# Patient Record
Sex: Male | Born: 1955 | Race: White | Hispanic: No | Marital: Married | Smoking: Former smoker
Health system: Southern US, Community
[De-identification: ages and names within clinical notes are randomized; demographics above are authoritative.]

## PROBLEM LIST (undated history)

## (undated) DIAGNOSIS — J42 Unspecified chronic bronchitis: Secondary | ICD-10-CM

## (undated) DIAGNOSIS — M170 Bilateral primary osteoarthritis of knee: Secondary | ICD-10-CM

## (undated) DIAGNOSIS — M25569 Pain in unspecified knee: Secondary | ICD-10-CM

## (undated) DIAGNOSIS — M869 Osteomyelitis, unspecified: Secondary | ICD-10-CM

## (undated) DIAGNOSIS — F4321 Adjustment disorder with depressed mood: Secondary | ICD-10-CM

## (undated) DIAGNOSIS — F419 Anxiety disorder, unspecified: Secondary | ICD-10-CM

## (undated) DIAGNOSIS — I4891 Unspecified atrial fibrillation: Secondary | ICD-10-CM

## (undated) DIAGNOSIS — I2699 Other pulmonary embolism without acute cor pulmonale: Secondary | ICD-10-CM

## (undated) DIAGNOSIS — K279 Peptic ulcer, site unspecified, unspecified as acute or chronic, without hemorrhage or perforation: Secondary | ICD-10-CM

## (undated) DIAGNOSIS — G8929 Other chronic pain: Secondary | ICD-10-CM

## (undated) DIAGNOSIS — I739 Peripheral vascular disease, unspecified: Secondary | ICD-10-CM

## (undated) DIAGNOSIS — F329 Major depressive disorder, single episode, unspecified: Secondary | ICD-10-CM

## (undated) DIAGNOSIS — K219 Gastro-esophageal reflux disease without esophagitis: Secondary | ICD-10-CM

## (undated) DIAGNOSIS — G43909 Migraine, unspecified, not intractable, without status migrainosus: Secondary | ICD-10-CM

## (undated) DIAGNOSIS — M25512 Pain in left shoulder: Secondary | ICD-10-CM

## (undated) DIAGNOSIS — E785 Hyperlipidemia, unspecified: Secondary | ICD-10-CM

## (undated) DIAGNOSIS — F32A Depression, unspecified: Secondary | ICD-10-CM

## (undated) DIAGNOSIS — I10 Essential (primary) hypertension: Secondary | ICD-10-CM

## (undated) HISTORY — DX: Osteomyelitis, unspecified: M86.9

## (undated) HISTORY — PX: TONSILLECTOMY: SUR1361

## (undated) HISTORY — PX: HEMORRHOIDECTOMY WITH HEMORRHOID BANDING: SHX5633

## (undated) HISTORY — PX: APPENDECTOMY: SHX54

## (undated) HISTORY — PX: JOINT REPLACEMENT: SHX530

## (undated) HISTORY — PX: CHOLECYSTECTOMY: SHX55

## (undated) HISTORY — PX: SHOULDER ARTHROSCOPY W/ ROTATOR CUFF REPAIR: SHX2400

## (undated) HISTORY — PX: INGUINAL HERNIA REPAIR: SUR1180

## (undated) HISTORY — PX: KNEE ARTHROSCOPY: SHX127

## (undated) HISTORY — PX: REPLACEMENT TOTAL KNEE BILATERAL: SUR1225

---

## 1988-07-27 HISTORY — PX: ELBOW ARTHROSCOPY WITH TENDON RECONSTRUCTION: SHX5616

## 1989-07-27 HISTORY — PX: UVULOPALATOPLASTY: SHX2633

## 1991-07-28 HISTORY — PX: CARDIAC CATHETERIZATION: SHX172

## 1998-03-18 ENCOUNTER — Inpatient Hospital Stay (HOSPITAL_COMMUNITY): Admission: EM | Admit: 1998-03-18 | Discharge: 1998-03-19 | Payer: Self-pay | Admitting: Emergency Medicine

## 1998-03-18 ENCOUNTER — Ambulatory Visit (HOSPITAL_COMMUNITY): Admission: RE | Admit: 1998-03-18 | Discharge: 1998-03-18 | Payer: Self-pay | Admitting: *Deleted

## 1998-04-07 ENCOUNTER — Emergency Department (HOSPITAL_COMMUNITY): Admission: EM | Admit: 1998-04-07 | Discharge: 1998-04-07 | Payer: Self-pay | Admitting: Emergency Medicine

## 1998-04-28 ENCOUNTER — Emergency Department (HOSPITAL_COMMUNITY): Admission: EM | Admit: 1998-04-28 | Discharge: 1998-04-28 | Payer: Self-pay

## 1998-05-12 ENCOUNTER — Emergency Department (HOSPITAL_COMMUNITY): Admission: EM | Admit: 1998-05-12 | Discharge: 1998-05-12 | Payer: Self-pay | Admitting: Emergency Medicine

## 1998-06-16 ENCOUNTER — Emergency Department (HOSPITAL_COMMUNITY): Admission: EM | Admit: 1998-06-16 | Discharge: 1998-06-16 | Payer: Self-pay | Admitting: Emergency Medicine

## 1998-07-26 ENCOUNTER — Emergency Department (HOSPITAL_COMMUNITY): Admission: EM | Admit: 1998-07-26 | Discharge: 1998-07-26 | Payer: Self-pay | Admitting: Emergency Medicine

## 1998-07-27 ENCOUNTER — Emergency Department (HOSPITAL_COMMUNITY): Admission: EM | Admit: 1998-07-27 | Discharge: 1998-07-27 | Payer: Self-pay | Admitting: Emergency Medicine

## 1998-08-11 ENCOUNTER — Emergency Department (HOSPITAL_COMMUNITY): Admission: EM | Admit: 1998-08-11 | Discharge: 1998-08-11 | Payer: Self-pay | Admitting: Endocrinology

## 1998-09-14 ENCOUNTER — Emergency Department (HOSPITAL_COMMUNITY): Admission: EM | Admit: 1998-09-14 | Discharge: 1998-09-14 | Payer: Self-pay | Admitting: Internal Medicine

## 1998-11-15 ENCOUNTER — Emergency Department (HOSPITAL_COMMUNITY): Admission: EM | Admit: 1998-11-15 | Discharge: 1998-11-15 | Payer: Self-pay | Admitting: Emergency Medicine

## 1998-11-19 ENCOUNTER — Observation Stay (HOSPITAL_COMMUNITY): Admission: AD | Admit: 1998-11-19 | Discharge: 1998-11-20 | Payer: Self-pay | Admitting: *Deleted

## 1998-11-24 ENCOUNTER — Inpatient Hospital Stay (HOSPITAL_COMMUNITY): Admission: EM | Admit: 1998-11-24 | Discharge: 1998-11-28 | Payer: Self-pay | Admitting: Emergency Medicine

## 1998-11-28 ENCOUNTER — Inpatient Hospital Stay (HOSPITAL_COMMUNITY): Admission: EM | Admit: 1998-11-28 | Discharge: 1998-12-02 | Payer: Self-pay | Admitting: Addiction Psychiatry

## 1998-12-03 ENCOUNTER — Encounter (HOSPITAL_COMMUNITY): Admission: RE | Admit: 1998-12-03 | Discharge: 1999-01-03 | Payer: Self-pay | Admitting: Psychiatry

## 1999-01-06 ENCOUNTER — Encounter: Admission: RE | Admit: 1999-01-06 | Discharge: 1999-02-11 | Payer: Self-pay | Admitting: *Deleted

## 1999-11-08 ENCOUNTER — Other Ambulatory Visit: Admission: RE | Admit: 1999-11-08 | Discharge: 1999-11-08 | Payer: Self-pay | Admitting: General Surgery

## 1999-11-08 ENCOUNTER — Encounter (INDEPENDENT_AMBULATORY_CARE_PROVIDER_SITE_OTHER): Payer: Self-pay | Admitting: Specialist

## 2000-09-16 ENCOUNTER — Observation Stay (HOSPITAL_COMMUNITY): Admission: EM | Admit: 2000-09-16 | Discharge: 2000-09-17 | Payer: Self-pay | Admitting: Emergency Medicine

## 2000-09-16 ENCOUNTER — Encounter: Payer: Self-pay | Admitting: Internal Medicine

## 2000-09-17 ENCOUNTER — Encounter: Payer: Self-pay | Admitting: Internal Medicine

## 2000-12-20 ENCOUNTER — Encounter: Payer: Self-pay | Admitting: Orthopedic Surgery

## 2000-12-20 ENCOUNTER — Ambulatory Visit (HOSPITAL_COMMUNITY): Admission: RE | Admit: 2000-12-20 | Discharge: 2000-12-20 | Payer: Self-pay | Admitting: Orthopedic Surgery

## 2000-12-29 ENCOUNTER — Ambulatory Visit (HOSPITAL_BASED_OUTPATIENT_CLINIC_OR_DEPARTMENT_OTHER): Admission: RE | Admit: 2000-12-29 | Discharge: 2000-12-29 | Payer: Self-pay | Admitting: Orthopedic Surgery

## 2001-06-01 ENCOUNTER — Ambulatory Visit (HOSPITAL_BASED_OUTPATIENT_CLINIC_OR_DEPARTMENT_OTHER): Admission: RE | Admit: 2001-06-01 | Discharge: 2001-06-01 | Payer: Self-pay | Admitting: Orthopedic Surgery

## 2001-08-18 ENCOUNTER — Ambulatory Visit (HOSPITAL_COMMUNITY): Admission: RE | Admit: 2001-08-18 | Discharge: 2001-08-18 | Payer: Self-pay | Admitting: Gastroenterology

## 2001-10-18 ENCOUNTER — Ambulatory Visit (HOSPITAL_COMMUNITY): Admission: RE | Admit: 2001-10-18 | Discharge: 2001-10-18 | Payer: Self-pay | Admitting: Family Medicine

## 2001-10-18 ENCOUNTER — Encounter: Payer: Self-pay | Admitting: Family Medicine

## 2002-05-07 ENCOUNTER — Emergency Department (HOSPITAL_COMMUNITY): Admission: EM | Admit: 2002-05-07 | Discharge: 2002-05-07 | Payer: Self-pay | Admitting: Emergency Medicine

## 2002-05-14 ENCOUNTER — Encounter: Payer: Self-pay | Admitting: *Deleted

## 2002-05-14 ENCOUNTER — Emergency Department (HOSPITAL_COMMUNITY): Admission: EM | Admit: 2002-05-14 | Discharge: 2002-05-14 | Payer: Self-pay | Admitting: Emergency Medicine

## 2002-05-19 ENCOUNTER — Ambulatory Visit (HOSPITAL_COMMUNITY): Admission: RE | Admit: 2002-05-19 | Discharge: 2002-05-19 | Payer: Self-pay | Admitting: *Deleted

## 2002-05-19 ENCOUNTER — Encounter: Payer: Self-pay | Admitting: *Deleted

## 2002-06-03 ENCOUNTER — Ambulatory Visit (HOSPITAL_BASED_OUTPATIENT_CLINIC_OR_DEPARTMENT_OTHER): Admission: RE | Admit: 2002-06-03 | Discharge: 2002-06-03 | Payer: Self-pay | Admitting: Pulmonary Disease

## 2002-10-15 ENCOUNTER — Emergency Department (HOSPITAL_COMMUNITY): Admission: EM | Admit: 2002-10-15 | Discharge: 2002-10-15 | Payer: Self-pay | Admitting: Emergency Medicine

## 2002-12-02 ENCOUNTER — Encounter: Payer: Self-pay | Admitting: Emergency Medicine

## 2002-12-02 ENCOUNTER — Inpatient Hospital Stay (HOSPITAL_COMMUNITY): Admission: EM | Admit: 2002-12-02 | Discharge: 2002-12-04 | Payer: Self-pay | Admitting: Emergency Medicine

## 2003-02-18 ENCOUNTER — Emergency Department (HOSPITAL_COMMUNITY): Admission: EM | Admit: 2003-02-18 | Discharge: 2003-02-18 | Payer: Self-pay | Admitting: Emergency Medicine

## 2003-03-01 ENCOUNTER — Encounter: Payer: Self-pay | Admitting: Orthopedic Surgery

## 2003-03-01 ENCOUNTER — Ambulatory Visit (HOSPITAL_COMMUNITY): Admission: RE | Admit: 2003-03-01 | Discharge: 2003-03-01 | Payer: Self-pay | Admitting: Orthopedic Surgery

## 2003-03-10 ENCOUNTER — Emergency Department (HOSPITAL_COMMUNITY): Admission: EM | Admit: 2003-03-10 | Discharge: 2003-03-10 | Payer: Self-pay | Admitting: Emergency Medicine

## 2003-04-22 ENCOUNTER — Emergency Department (HOSPITAL_COMMUNITY): Admission: EM | Admit: 2003-04-22 | Discharge: 2003-04-22 | Payer: Self-pay | Admitting: *Deleted

## 2003-05-14 ENCOUNTER — Emergency Department (HOSPITAL_COMMUNITY): Admission: EM | Admit: 2003-05-14 | Discharge: 2003-05-14 | Payer: Self-pay | Admitting: Emergency Medicine

## 2003-05-25 ENCOUNTER — Emergency Department (HOSPITAL_COMMUNITY): Admission: EM | Admit: 2003-05-25 | Discharge: 2003-05-25 | Payer: Self-pay | Admitting: Emergency Medicine

## 2003-05-28 ENCOUNTER — Emergency Department (HOSPITAL_COMMUNITY): Admission: EM | Admit: 2003-05-28 | Discharge: 2003-05-28 | Payer: Self-pay | Admitting: Emergency Medicine

## 2003-08-05 ENCOUNTER — Emergency Department (HOSPITAL_COMMUNITY): Admission: EM | Admit: 2003-08-05 | Discharge: 2003-08-05 | Payer: Self-pay | Admitting: Emergency Medicine

## 2003-08-19 ENCOUNTER — Emergency Department (HOSPITAL_COMMUNITY): Admission: EM | Admit: 2003-08-19 | Discharge: 2003-08-19 | Payer: Self-pay | Admitting: Emergency Medicine

## 2003-09-10 ENCOUNTER — Ambulatory Visit (HOSPITAL_COMMUNITY): Admission: RE | Admit: 2003-09-10 | Discharge: 2003-09-10 | Payer: Self-pay | Admitting: Family Medicine

## 2003-09-27 ENCOUNTER — Emergency Department (HOSPITAL_COMMUNITY): Admission: EM | Admit: 2003-09-27 | Discharge: 2003-09-27 | Payer: Self-pay | Admitting: Emergency Medicine

## 2003-10-08 ENCOUNTER — Emergency Department (HOSPITAL_COMMUNITY): Admission: EM | Admit: 2003-10-08 | Discharge: 2003-10-08 | Payer: Self-pay | Admitting: *Deleted

## 2003-11-04 ENCOUNTER — Emergency Department (HOSPITAL_COMMUNITY): Admission: EM | Admit: 2003-11-04 | Discharge: 2003-11-04 | Payer: Self-pay | Admitting: Emergency Medicine

## 2003-11-24 ENCOUNTER — Emergency Department (HOSPITAL_COMMUNITY): Admission: EM | Admit: 2003-11-24 | Discharge: 2003-11-24 | Payer: Self-pay | Admitting: *Deleted

## 2003-12-02 ENCOUNTER — Emergency Department (HOSPITAL_COMMUNITY): Admission: EM | Admit: 2003-12-02 | Discharge: 2003-12-02 | Payer: Self-pay | Admitting: Emergency Medicine

## 2003-12-22 ENCOUNTER — Emergency Department (HOSPITAL_COMMUNITY): Admission: EM | Admit: 2003-12-22 | Discharge: 2003-12-22 | Payer: Self-pay | Admitting: Emergency Medicine

## 2003-12-30 ENCOUNTER — Emergency Department (HOSPITAL_COMMUNITY): Admission: EM | Admit: 2003-12-30 | Discharge: 2003-12-30 | Payer: Self-pay | Admitting: Emergency Medicine

## 2004-01-19 ENCOUNTER — Emergency Department (HOSPITAL_COMMUNITY): Admission: EM | Admit: 2004-01-19 | Discharge: 2004-01-19 | Payer: Self-pay | Admitting: Emergency Medicine

## 2004-02-10 ENCOUNTER — Emergency Department (HOSPITAL_COMMUNITY): Admission: EM | Admit: 2004-02-10 | Discharge: 2004-02-10 | Payer: Self-pay | Admitting: Emergency Medicine

## 2004-02-20 ENCOUNTER — Emergency Department (HOSPITAL_COMMUNITY): Admission: EM | Admit: 2004-02-20 | Discharge: 2004-02-20 | Payer: Self-pay | Admitting: Emergency Medicine

## 2004-04-05 ENCOUNTER — Emergency Department (HOSPITAL_COMMUNITY): Admission: EM | Admit: 2004-04-05 | Discharge: 2004-04-05 | Payer: Self-pay | Admitting: Emergency Medicine

## 2004-08-10 ENCOUNTER — Emergency Department (HOSPITAL_COMMUNITY): Admission: EM | Admit: 2004-08-10 | Discharge: 2004-08-10 | Payer: Self-pay | Admitting: Emergency Medicine

## 2004-08-16 ENCOUNTER — Emergency Department (HOSPITAL_COMMUNITY): Admission: EM | Admit: 2004-08-16 | Discharge: 2004-08-16 | Payer: Self-pay | Admitting: Emergency Medicine

## 2004-08-30 ENCOUNTER — Emergency Department (HOSPITAL_COMMUNITY): Admission: EM | Admit: 2004-08-30 | Discharge: 2004-08-30 | Payer: Self-pay | Admitting: Emergency Medicine

## 2004-09-19 ENCOUNTER — Emergency Department (HOSPITAL_COMMUNITY): Admission: EM | Admit: 2004-09-19 | Discharge: 2004-09-19 | Payer: Self-pay | Admitting: Emergency Medicine

## 2004-09-30 ENCOUNTER — Emergency Department (HOSPITAL_COMMUNITY): Admission: EM | Admit: 2004-09-30 | Discharge: 2004-09-30 | Payer: Self-pay | Admitting: Emergency Medicine

## 2004-10-07 ENCOUNTER — Emergency Department (HOSPITAL_COMMUNITY): Admission: EM | Admit: 2004-10-07 | Discharge: 2004-10-07 | Payer: Self-pay | Admitting: Emergency Medicine

## 2004-10-23 ENCOUNTER — Emergency Department (HOSPITAL_COMMUNITY): Admission: EM | Admit: 2004-10-23 | Discharge: 2004-10-23 | Payer: Self-pay | Admitting: Emergency Medicine

## 2004-11-13 ENCOUNTER — Emergency Department (HOSPITAL_COMMUNITY): Admission: EM | Admit: 2004-11-13 | Discharge: 2004-11-13 | Payer: Self-pay | Admitting: Family Medicine

## 2004-11-17 ENCOUNTER — Emergency Department (HOSPITAL_COMMUNITY): Admission: EM | Admit: 2004-11-17 | Discharge: 2004-11-17 | Payer: Self-pay | Admitting: Emergency Medicine

## 2004-11-23 ENCOUNTER — Emergency Department (HOSPITAL_COMMUNITY): Admission: EM | Admit: 2004-11-23 | Discharge: 2004-11-23 | Payer: Self-pay | Admitting: Emergency Medicine

## 2004-12-01 ENCOUNTER — Emergency Department (HOSPITAL_COMMUNITY): Admission: EM | Admit: 2004-12-01 | Discharge: 2004-12-01 | Payer: Self-pay | Admitting: Family Medicine

## 2004-12-07 ENCOUNTER — Emergency Department (HOSPITAL_COMMUNITY): Admission: EM | Admit: 2004-12-07 | Discharge: 2004-12-07 | Payer: Self-pay | Admitting: Emergency Medicine

## 2004-12-16 ENCOUNTER — Emergency Department (HOSPITAL_COMMUNITY): Admission: EM | Admit: 2004-12-16 | Discharge: 2004-12-17 | Payer: Self-pay | Admitting: *Deleted

## 2004-12-29 ENCOUNTER — Emergency Department (HOSPITAL_COMMUNITY): Admission: EM | Admit: 2004-12-29 | Discharge: 2004-12-29 | Payer: Self-pay | Admitting: Emergency Medicine

## 2005-01-07 ENCOUNTER — Emergency Department (HOSPITAL_COMMUNITY): Admission: EM | Admit: 2005-01-07 | Discharge: 2005-01-07 | Payer: Self-pay | Admitting: Emergency Medicine

## 2005-01-23 ENCOUNTER — Emergency Department (HOSPITAL_COMMUNITY): Admission: EM | Admit: 2005-01-23 | Discharge: 2005-01-23 | Payer: Self-pay | Admitting: Emergency Medicine

## 2005-02-11 ENCOUNTER — Emergency Department (HOSPITAL_COMMUNITY): Admission: EM | Admit: 2005-02-11 | Discharge: 2005-02-11 | Payer: Self-pay | Admitting: Emergency Medicine

## 2005-02-17 ENCOUNTER — Emergency Department (HOSPITAL_COMMUNITY): Admission: EM | Admit: 2005-02-17 | Discharge: 2005-02-18 | Payer: Self-pay | Admitting: Emergency Medicine

## 2005-02-20 ENCOUNTER — Emergency Department (HOSPITAL_COMMUNITY): Admission: EM | Admit: 2005-02-20 | Discharge: 2005-02-20 | Payer: Self-pay | Admitting: Emergency Medicine

## 2005-02-27 ENCOUNTER — Emergency Department (HOSPITAL_COMMUNITY): Admission: EM | Admit: 2005-02-27 | Discharge: 2005-02-27 | Payer: Self-pay | Admitting: Emergency Medicine

## 2005-03-10 ENCOUNTER — Emergency Department (HOSPITAL_COMMUNITY): Admission: EM | Admit: 2005-03-10 | Discharge: 2005-03-10 | Payer: Self-pay | Admitting: Emergency Medicine

## 2005-03-11 ENCOUNTER — Emergency Department (HOSPITAL_COMMUNITY): Admission: EM | Admit: 2005-03-11 | Discharge: 2005-03-11 | Payer: Self-pay | Admitting: Emergency Medicine

## 2005-03-17 ENCOUNTER — Emergency Department (HOSPITAL_COMMUNITY): Admission: EM | Admit: 2005-03-17 | Discharge: 2005-03-17 | Payer: Self-pay | Admitting: Emergency Medicine

## 2005-03-25 ENCOUNTER — Emergency Department (HOSPITAL_COMMUNITY): Admission: EM | Admit: 2005-03-25 | Discharge: 2005-03-25 | Payer: Self-pay | Admitting: Emergency Medicine

## 2005-04-02 ENCOUNTER — Emergency Department (HOSPITAL_COMMUNITY): Admission: EM | Admit: 2005-04-02 | Discharge: 2005-04-02 | Payer: Self-pay | Admitting: Emergency Medicine

## 2005-04-09 ENCOUNTER — Emergency Department (HOSPITAL_COMMUNITY): Admission: EM | Admit: 2005-04-09 | Discharge: 2005-04-09 | Payer: Self-pay | Admitting: *Deleted

## 2005-04-24 ENCOUNTER — Emergency Department (HOSPITAL_COMMUNITY): Admission: EM | Admit: 2005-04-24 | Discharge: 2005-04-24 | Payer: Self-pay | Admitting: Emergency Medicine

## 2005-05-10 ENCOUNTER — Emergency Department (HOSPITAL_COMMUNITY): Admission: EM | Admit: 2005-05-10 | Discharge: 2005-05-10 | Payer: Self-pay | Admitting: Emergency Medicine

## 2005-07-07 ENCOUNTER — Emergency Department (HOSPITAL_COMMUNITY): Admission: EM | Admit: 2005-07-07 | Discharge: 2005-07-07 | Payer: Self-pay | Admitting: Emergency Medicine

## 2005-07-27 ENCOUNTER — Emergency Department (HOSPITAL_COMMUNITY): Admission: EM | Admit: 2005-07-27 | Discharge: 2005-07-27 | Payer: Self-pay | Admitting: Emergency Medicine

## 2005-08-09 ENCOUNTER — Emergency Department (HOSPITAL_COMMUNITY): Admission: EM | Admit: 2005-08-09 | Discharge: 2005-08-09 | Payer: Self-pay | Admitting: Emergency Medicine

## 2005-08-11 ENCOUNTER — Emergency Department (HOSPITAL_COMMUNITY): Admission: EM | Admit: 2005-08-11 | Discharge: 2005-08-11 | Payer: Self-pay | Admitting: Emergency Medicine

## 2005-11-01 ENCOUNTER — Emergency Department (HOSPITAL_COMMUNITY): Admission: EM | Admit: 2005-11-01 | Discharge: 2005-11-01 | Payer: Self-pay | Admitting: Emergency Medicine

## 2006-04-03 ENCOUNTER — Emergency Department (HOSPITAL_COMMUNITY): Admission: EM | Admit: 2006-04-03 | Discharge: 2006-04-04 | Payer: Self-pay | Admitting: Emergency Medicine

## 2006-07-19 ENCOUNTER — Emergency Department (HOSPITAL_COMMUNITY): Admission: EM | Admit: 2006-07-19 | Discharge: 2006-07-19 | Payer: Self-pay | Admitting: Emergency Medicine

## 2006-11-16 ENCOUNTER — Emergency Department (HOSPITAL_COMMUNITY): Admission: EM | Admit: 2006-11-16 | Discharge: 2006-11-16 | Payer: Self-pay | Admitting: Emergency Medicine

## 2007-02-20 ENCOUNTER — Emergency Department (HOSPITAL_COMMUNITY): Admission: EM | Admit: 2007-02-20 | Discharge: 2007-02-20 | Payer: Self-pay | Admitting: Family Medicine

## 2010-12-12 NOTE — Op Note (Signed)
[place]. [facility]  Patient:    [name], [name]                          MRN: [id number] Proc. Date: 12/29/00 Adm. Date:  [id number] Disc. 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  OPERATION:  Right elbow arthroscopy with debridement of capitellar cyst and drilling.  SURGEON:  Georgena Spurling, M.D.  ASSISTANT:  None.  ANESTHESIA:  General.  INDICATION FOR PROCEDURE:  The patient is a 54 year old white male with mechanical symptoms unrelenting with physical therapy and after informed consent was obtained he was taken to the operating room.  DESCRIPTION OF PROCEDURE:  The patient was laid in the supine position and administered general endotracheal anesthesia and then placed in the left down right up lateral decubitus position.  The right upper extremity was prepped in the usual sterile fashion with the arm draped over an arm board.  We created a proximal medial portal 2 cm proximal to the medial epicondyle and over the intramuscular septum.  We palpated the intramuscular septum and made sure that we stayed anterior to that.  We went down into the radial capitellar joint bluntly.  We then inserted the camera and proved the joint.  It was difficult to see anything wrong with the radial capitellar joint.  There was some synovitis so we did make a lateral portal with the inside out technique and did debride the synovitis. We then entered the radial capitellar joint through the soft spot and triangle portal with the vac and a proximal straight posterior portal.  We continued to debride some synovitis and then found a large loose flap of cartilage in the far medial side of the capitellum which was over a cyst approximately a square centimeter in size.  I debrided  this flap of cartilage, debrided the cyst and drilled the cyst and tried to initiate some fracture healing response there. Once I had debrided this area thoroughly, I checked the rest of the joint.  I lavaged the joint and evacuated the joint of fluid and instruments.  I closed each portal with interrupted 4-0 Nylon.  Dressed with Adaptic 4 x 4s, bulky dressing and a simple sling.  The patient tolerated the procedure well. Complications were none.  Drains were none. DD:  02/10/01 TD:  02/10/01 Job: [id number]

## 2010-12-12 NOTE — Op Note (Signed)
[place]. [facility]  Patient:    [name], [name] Visit Number: [id number] MRN: [id number]          Service Type: DSU Location: [facility] Attending Physician:  Georgena Spurling Dictated by:   Georgena Spurling, M.D. Proc. Date: 06/01/01 Admit Date:  06/01/2001 Discharge Date: 06/01/2001                             Operative Report  PREOPERATIVE DIAGNOSIS:  Right elbow lateral epicondylitis.  POSTOPERATIVE DIAGNOSIS:  Right elbow lateral epicondylitis.  OPERATION PERFORMED:  Right lateral epicondylar release.  SURGEON:  Georgena Spurling, M.D.  ANESTHESIA:  General LMA.  INDICATIONS FOR PROCEDURE:  The patient is several months status post conservative measures for treatment of lateral epicondylitis and informed consent was obtained.  DESCRIPTION OF PROCEDURE:  The patient was laid supine and administered general LMA anesthesia.  Right upper extremity was prepped and draped in the usual sterile fashion.  A straight 3 cm incision was made over the lateral epicondyle through the skin and small flaps were raised.  I then incised the common extensor tendon and ellipsed out the major portion and released it from the epicondyle.  I then subperiosteally removed all debris from the bone and placed five to six drill holes into the bone.  At this point we irrigated the wound and then closed the sheath over the common extensor tendon with interrupted figure-of-eight 0 Vicryl sutures and then 2-0 Vicryl sutures in the subcuticular layer and then Steri-Strips.  We dressed with Adaptic, 4 x 4s, sterile Webril, Ace wrap and a sling.  The patient tolerated the procedure well.  COMPLICATIONS:  None.  DRAINS:  None. Dictated by:   Georgena Spurling, M.D. Attending Physician:  Georgena Spurling DD:  06/01/01 TD:  06/03/01 Job: [id number]

## 2010-12-12 NOTE — Cardiovascular Report (Signed)
   [name], [name]                             ACCOUNT NO.:  [id number]   MEDICAL RECORD NO.:  [id number]                   PATIENT TYPE:  INP   LOCATION:  4712                                 FACILITY:  [facility]   PHYSICIAN:  Peter M. Swaziland, M.D.               DATE OF BIRTH:  11-12-1955   DATE OF PROCEDURE:  12/04/2002  DATE OF DISCHARGE:                              CARDIAC CATHETERIZATION   INDICATIONS FOR PROCEDURE:  The patient is a 55 year old white male who  presents with refractory chest pain during treatment for severe migraines  with high-dose Imitrex.  The patient has a history of hyperlipidemia.   ACCESS:  Via the right femoral artery using the standard Seldinger  technique.   EQUIPMENT:  Six-French 4-cm right and left Judkins catheters, a 6-French  pigtail catheter, a 6-French arterial sheath.   MEDICATIONS:  Local anesthesia with 1% Xylocaine, Versed 2 mg IV.   CONTRAST:  130 mL of Omnipaque.   HEMODYNAMIC DATA:  AORTIC PRESSURE:  138/90 with a mean of 112.  LEFT VENTRICULAR PRESSURE:  132 with an EDP of 22 mmHg.   ANGIOGRAPHIC DATA:  LEFT CORONARY ARTERY:  Arises and distributes normally.  The left main coronary artery is normal.   LEFT ANTERIOR DESCENDING ARTERY:  The left anterior descending and its  branches are normal.   LEFT CIRCUMFLEX CORONARY ARTERY:  Normal.   RIGHT CORONARY ARTERY:  The right coronary artery is a large, dominant  vessel, and is normal.   LEFT VENTRICULAR ANGIOGRAPHY:  Performed in the RAO view.  This demonstrates  normal left ventricular size and contractility with normal systolic  function.  Ejection fraction is estimated at 60%.  There is no mitral  regurgitation or prolapse.  The aortic root appears normal in size.  The  aortic valve appears normal.    FINAL INTERPRETATION:  1. Normal coronary anatomy.  2. Normal left ventricular function.                                               Peter M. Swaziland, M.D.    PMJ/MEDQ   D:  12/04/2002  T:  12/05/2002  Job:  [id number]   cc:   Holley Bouche, M.D.  [address]  [place], [place] [address]  Fax: [phone]   Lazaro Arms, M.D.  [address] [place], [place] [address]  Fax: [phone]

## 2010-12-12 NOTE — Discharge Summary (Signed)
[name], [name]                             ACCOUNT NO.:  [id number]   MEDICAL RECORD NO.:  [id number]                   PATIENT TYPE:  INP   LOCATION:  4712                                 FACILITY:  [facility]   PHYSICIAN:  Phyllip Claw DICTATOR                    DATE OF BIRTH:  29-Jun-1956   DATE OF ADMISSION:  12/02/2002  DATE OF DISCHARGE:  12/04/2002                                 DISCHARGE SUMMARY   DISCHARGE DIAGNOSES:  1. Atypical chest pain, possible gastroesophageal reflux disease or     intermittent vasospasm.  2. Migraine headaches.  3. Hypercholesterolemia.  4. Anxiety.  5. Depression.  6. Gastroesophageal reflux disease.   PROCEDURE:  Cardiac catheterization on Dec 04, 2002 which was within normal  limits.   HISTORY OF PRESENT ILLNESS:  The patient presented to the emergency room  with substernal chest pain accompanied by shortness of breath and nausea  about 30 minutes after taking high dose Imitrex for refractory migraines.  Pain lasted about an hour and was made better with nitroglycerin.  He was  sent to the emergency room for further evaluation.  In the emergency room he  was pain-free.  He was admitted, placed on telemetry, and subsequently ruled  out for myocardial infarction with serial negative troponins.  Peter M.  Swazil[place], M.D. from cardiology was consulted on Dec 03, 2002.  The patient did  have some recurrence of the chest discomfort and so at that point Peter M.  [place]and, M.D. recommended cardiac catheterization in order to assess whether  or not he had any underlying coronary artery disease.  The patient had the  occasional chest pain, but actually his major complaint during the  hospitalization was his migraines which were made much worse by nitrate  therapy.  The patient underwent cardiac catheterization without  complications on Dec 04, 2002.  He did very well and it was a completely  normal catheterization with a normal ejection fraction.  The  patient  remained on the floor per protocol for the catheterization and was  discharged home in stable condition on Dec 04, 2002 with his wife.   DISCHARGE PLAN:  1. The patient is to go home today.  He has an appointment tomorrow with Dr.     Tiburcio Pea already in order to discuss further therapy for his migraines.  2. Dr. Tiburcio Pea will also follow up on his reflux as we started empiric PPI     therapy while he was in the hospital.   DISCHARGE MEDICATIONS:  1. Klonopin 2 mg p.o. t.i.d.  2. Pravachol 40 mg p.o. q.h.s.  3. Celexa 60 mg p.o. daily.  4. Aspirin 325 mg p.o. daily.  5. Keflex 3000 mg p.o. daily.  6.     Protonix 40 mg p.o. daily.  7. Norvasc 5 mg p.o. daily as an empiric trial of a possible vasodilator  in     order to help his chest pain.  This was started by Peter M. Swaziland, M.D.                                               Latesa Fratto DICTATOR    DD/MEDQ  D:  12/04/2002  T:  12/06/2002  Job:  [id number]   cc:   Peter M. Sw[place]il[place], M.D.  [address]  [place], [place] [address]  Fax: [phone]   Tiburcio Pea, M.D.

## 2010-12-12 NOTE — Consult Note (Signed)
[name], STEELY                             ACCOUNT NO.:  [id number]   MEDICAL RECORD NO.:  [id number]                   PATIENT TYPE:  INP   LOCATION:  4712                                 FACILITY:  [facility]   PHYSICIAN:  [name], M.D.               DATE OF BIRTH:  1956-07-10   DATE OF CONSULTATION:  12/03/2002  DATE OF DISCHARGE:  12/04/2002                                   CONSULTATION   HISTORY OF PRESENT ILLNESS:  The patient is a 55 year old white male with  history of severe chronic refractory migraine headaches.  He has been  treated with high dose Imitrex and vasoconstrictors.  He was admitted last  night after a prolonged episode of chest pain associated with taking high  dose Imitrex.  The pain was substernal in location with severe heaviness  lasting approximately one hour and was relieved with sublingual  nitroglycerin.  He was admitted last night and subsequently ruled out for  myocardial infarction.  However, this morning after breakfast his pain  recurred and again was 90% relieved with sublingual nitroglycerin. His pain  was completely relieved after morphine.  There were no acute ST changes  noted on ECG during his pain.  The patient does have cardiac risk factors  including age, sex, dyslipidemia and history of mild hypertension.  He had  undergone prior routine exercise stress test in 2/02 and this was a negative  study.   PAST MEDICAL HISTORY:  Severe refractory chronic migraines, history of  hypertension, history of dyslipidemia, anxiety and depression.   CURRENT MEDICATIONS:  1. Klonopin 2 mg p.o. t.i.d.  2. Pravachol 40 mg daily.  3. Celexa 60 mg daily.  4. Niacin 500 mg p.o. q.h.s.  5. Keppra  300 mg daily.  6. Coated aspirin 325 mg daily.   ALLERGIES:  1. VERAPAMIL.  2. DHEA.  3. EFFEXOR.  4. ENTAL.   SOCIAL HISTORY:  The patient is married.  Denies tobacco or alcohol use.  He  is a Environmental consultant.   FAMILY HISTORY:   Mother is 83 and has a history of ventricular arrhythmias.  Father is age 83 and alive and well.   REVIEW OF SYSTEMS:  Otherwise unremarkable except for severe headache.   PHYSICAL EXAMINATION:  GENERAL:  The patient is an awake white male in no  apparent distress.  VITAL SIGNS:  Blood pressure is 130/84, pulse 86 and regular.  He is  afebrile.  HEENT:  Unremarkable.  He has no jugular venous distention or bruits.  LUNGS:  Clear.  CARDIAC:  Regular  rate and rhythm without murmurs, rubs or gallops or  clicks.  ABDOMEN:  Soft, nontender.  No masses or hepatosplenomegaly.  EXTREMITIES:  Femoral and pedal pulses are 2+ and symmetric.   LABORATORY DATA:  ECG on 5/8 and 12/03/02 with chest pain showed normal sinus  rhythm with first-degree AV  block and no acute ST-T wave changes.  White  count 8500, hemoglobin 14.5, hematocrit 42.6, platelets 286,000, sodium 139,  potassium 4.1, chloride 105, CO2 25, BUN 11, creatinine 1.2, glucose 109, CK  324 with 2.1 MB.  Subsequent CK is 215 with 1.3 MB, troponin 0.01 and less  than 0.01.   IMPRESSION:  1. Recurrent chest pain in setting of high dose Imitrex therapy.  Need to     consider coronary artery disease versus vasospastic angina.  2. Chronic refractory migraine headaches.  3. Dyslipidemia.  4. Borderline hypertension.   PLAN:  Would recommend starting Norvasc for treatment of his vasospastic  disease.  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  He is  intolerant to nitrates due to his migraine headaches.  Will plan cardiac  catheterization tomorrow to rule out obstructive coronary disease.                                                  [name], M.D.    PMJ/MEDQ  D:  12/03/2002  T:  12/05/2002  Job:  [id number]   cc:   Holley Bouche, M.D.  [address]  [place], [place] [address]  Fax: [phone]

## 2013-07-09 ENCOUNTER — Emergency Department (HOSPITAL_BASED_OUTPATIENT_CLINIC_OR_DEPARTMENT_OTHER)
Admission: EM | Admit: 2013-07-09 | Discharge: 2013-07-09 | Disposition: A | Discharge status: Home / Self Care | Payer: Managed Care, Other (non HMO) | Attending: Emergency Medicine | Admitting: Emergency Medicine

## 2013-07-09 ENCOUNTER — Encounter (HOSPITAL_BASED_OUTPATIENT_CLINIC_OR_DEPARTMENT_OTHER): Payer: Self-pay | Admitting: Emergency Medicine

## 2013-07-09 DIAGNOSIS — L03116 Cellulitis of left lower limb: Secondary | ICD-10-CM

## 2013-07-09 DIAGNOSIS — L02419 Cutaneous abscess of limb, unspecified: Secondary | ICD-10-CM | POA: Insufficient documentation

## 2013-07-09 DIAGNOSIS — Z79899 Other long term (current) drug therapy: Secondary | ICD-10-CM | POA: Insufficient documentation

## 2013-07-09 DIAGNOSIS — I10 Essential (primary) hypertension: Secondary | ICD-10-CM | POA: Insufficient documentation

## 2013-07-09 DIAGNOSIS — R509 Fever, unspecified: Secondary | ICD-10-CM | POA: Insufficient documentation

## 2013-07-09 HISTORY — DX: Essential (primary) hypertension: I10

## 2013-07-09 MED ORDER — HYDROCODONE-ACETAMINOPHEN 5-325 MG PO TABS: 1.0000 | ORAL_TABLET | Freq: Four times a day (QID) | ORAL | Status: DC | PRN

## 2013-07-09 MED ORDER — CEPHALEXIN 500 MG PO CAPS: 500.0000 mg | ORAL_CAPSULE | Freq: Four times a day (QID) | ORAL | Status: DC

## 2013-07-09 MED ORDER — SULFAMETHOXAZOLE-TMP DS 800-160 MG PO TABS: 1.0000 | ORAL_TABLET | Freq: Two times a day (BID) | ORAL | Status: DC

## 2013-07-09 NOTE — ED Notes (Signed)
I & D tray is at the bedside set up and ready for the doctor to use. 

## 2013-07-09 NOTE — ED Notes (Signed)
Patient here with abscess to left upper leg x 3 weeks. Has had same in past and normally with po antibiotics it resolves. Has been taking clindamycin without resolving, now out of medication. Pain with any movement

## 2013-07-09 NOTE — ED Provider Notes (Signed)
CSN: [id number]     Arrival date & time 07/09/13  1110 History   First MD Initiated Contact with Patient 07/09/13 1201     Chief Complaint  Patient presents with  . Abscess   (Consider location/radiation/quality/duration/timing/severity/associated sxs/prior Treatment) Patient is a 57 y.o. male presenting with abscess. The history is provided by the patient. No language interpreter was used.  Abscess Location:  Leg Leg abscess location:  L upper leg Abscess quality: induration, painful, redness and warmth   Red streaking: no   Duration:  3 weeks Progression:  Worsening Pain details:    Quality:  Throbbing   Severity:  Moderate   Duration:  3 weeks   Timing:  Intermittent   Progression:  Worsening Chronicity:  Recurrent Ineffective treatments:  Warm compresses, draining/squeezing and oral antibiotics Associated symptoms: fever     Past Medical History  Diagnosis Date  . Hypertension    History reviewed. No pertinent past surgical history. No family history on file. History  Substance Use Topics  . Smoking status: Never Smoker   . Smokeless tobacco: Not on file  . Alcohol Use: Not on file    Review of Systems  Constitutional: Positive for fever.  Skin:       Large area of swelling and redness left upper leg  All other systems reviewed and are negative.    Allergies  Review of patient's allergies indicates no known allergies.  Home Medications   Current Outpatient Rx  Name  Route  Sig  Dispense  Refill  . atenolol (TENORMIN) 100 MG tablet   Oral   Take 100 mg by mouth daily.         Marland Kitchen atorvastatin (LIPITOR) 80 MG tablet   Oral   Take 80 mg by mouth daily.          BP 124/73  Pulse 93  Temp(Src) 100.3 F (37.9 C) (Oral)  Resp 18  SpO2 96% Physical Exam  Nursing note and vitals reviewed. Constitutional: He is oriented to person, place, and time. He appears well-developed and well-nourished.  HENT:  Head: Normocephalic.  Eyes: Pupils are equal,  round, and reactive to light.  Neck: Normal range of motion.  Cardiovascular: Normal rate and regular rhythm.   Pulmonary/Chest: Effort normal and breath sounds normal.  Abdominal: Soft. Bowel sounds are normal.  Musculoskeletal: Normal range of motion. He exhibits tenderness.       Left upper leg: He exhibits tenderness and swelling.       Legs: Neurological: He is alert and oriented to person, place, and time.  Skin: Skin is warm and dry. There is erythema.  Psychiatric: He has a normal mood and affect.    ED Course  INCISION AND DRAINAGE Date/Time: 07/09/2013 12:30 PM Performed by: Jimmye Norman Authorized by: Jimmye Norman Consent: Verbal consent obtained. Risks and benefits: risks, benefits and alternatives were discussed Consent given by: patient Patient understanding: patient states understanding of the procedure being performed Type: abscess Body area: lower extremity Location details: left leg Anesthesia: local infiltration Local anesthetic: lidocaine 2% without epinephrine Anesthetic total: 5 ml Patient sedated: no Incision type: single straight Complexity: simple Drainage: bloody Wound treatment: wound left open Patient tolerance: Patient tolerated the procedure well with no immediate complications.   (including critical care time) Labs Review Labs Reviewed - No data to display Imaging Review No results found.  EKG Interpretation   None      No purulent drainage from I&D.  Bedside ultrasound performed by  Dr. Fonnie Jarvis, no distinct areas of fluid collection noted.  Will treat as cellulitis.  Return precautions discussed with patient. MDM   Cellulitis of left upper thigh.    Jimmye Norman, NP 07/09/13 [phone]

## 2013-07-10 NOTE — ED Provider Notes (Signed)
Medical screening examination/treatment/procedure(s) were conducted as a shared visit with non-physician practitioner(s) and myself.  I personally evaluated the patient during the encounter.  EKG Interpretation   None      Limited bedside US possible small subcut fluid collection.  Hurman Horn, MD 07/10/13 2038

## 2013-07-11 ENCOUNTER — Inpatient Hospital Stay (HOSPITAL_COMMUNITY)
Admission: EM | Admit: 2013-07-11 | Discharge: 2013-07-17 | DRG: 853 | Disposition: A | Discharge status: Home / Self Care | Payer: Managed Care, Other (non HMO) | Attending: Internal Medicine | Admitting: Internal Medicine

## 2013-07-11 ENCOUNTER — Encounter (HOSPITAL_COMMUNITY): Payer: Self-pay | Admitting: Emergency Medicine

## 2013-07-11 DIAGNOSIS — G43709 Chronic migraine without aura, not intractable, without status migrainosus: Secondary | ICD-10-CM | POA: Diagnosis present

## 2013-07-11 DIAGNOSIS — A419 Sepsis, unspecified organism: Principal | ICD-10-CM

## 2013-07-11 DIAGNOSIS — F411 Generalized anxiety disorder: Secondary | ICD-10-CM | POA: Diagnosis present

## 2013-07-11 DIAGNOSIS — N400 Enlarged prostate without lower urinary tract symptoms: Secondary | ICD-10-CM | POA: Diagnosis present

## 2013-07-11 DIAGNOSIS — H16209 Unspecified keratoconjunctivitis, unspecified eye: Secondary | ICD-10-CM | POA: Diagnosis present

## 2013-07-11 DIAGNOSIS — L03116 Cellulitis of left lower limb: Secondary | ICD-10-CM

## 2013-07-11 DIAGNOSIS — I10 Essential (primary) hypertension: Secondary | ICD-10-CM | POA: Diagnosis present

## 2013-07-11 DIAGNOSIS — L02416 Cutaneous abscess of left lower limb: Secondary | ICD-10-CM | POA: Diagnosis present

## 2013-07-11 DIAGNOSIS — Z79891 Long term (current) use of opiate analgesic: Secondary | ICD-10-CM

## 2013-07-11 DIAGNOSIS — M25569 Pain in unspecified knee: Secondary | ICD-10-CM | POA: Diagnosis present

## 2013-07-11 DIAGNOSIS — B0052 Herpesviral keratitis: Secondary | ICD-10-CM

## 2013-07-11 DIAGNOSIS — E785 Hyperlipidemia, unspecified: Secondary | ICD-10-CM | POA: Diagnosis present

## 2013-07-11 DIAGNOSIS — L02419 Cutaneous abscess of limb, unspecified: Secondary | ICD-10-CM | POA: Diagnosis present

## 2013-07-11 DIAGNOSIS — Z79899 Other long term (current) drug therapy: Secondary | ICD-10-CM

## 2013-07-11 DIAGNOSIS — Z96659 Presence of unspecified artificial knee joint: Secondary | ICD-10-CM

## 2013-07-11 DIAGNOSIS — N179 Acute kidney failure, unspecified: Secondary | ICD-10-CM

## 2013-07-11 DIAGNOSIS — G894 Chronic pain syndrome: Secondary | ICD-10-CM

## 2013-07-11 DIAGNOSIS — E349 Endocrine disorder, unspecified: Secondary | ICD-10-CM | POA: Diagnosis present

## 2013-07-11 LAB — CBC WITH DIFFERENTIAL/PLATELET
Basophils Absolute: 0 10*3/uL (ref 0.0–0.1)
Eosinophils Absolute: 0.3 10*3/uL (ref 0.0–0.7)
HCT: 29 % — ABNORMAL LOW (ref 39.0–52.0)
Lymphocytes Relative: 8 % — ABNORMAL LOW (ref 12–46)
Lymphs Abs: 2 10*3/uL (ref 0.7–4.0)
MCH: 29.8 pg (ref 26.0–34.0)
MCHC: 33.1 g/dL (ref 30.0–36.0)
MCV: 90.1 fL (ref 78.0–100.0)
Monocytes Absolute: 1.8 10*3/uL — ABNORMAL HIGH (ref 0.1–1.0)
Monocytes Relative: 7 % (ref 3–12)
Neutro Abs: 21.5 10*3/uL — ABNORMAL HIGH (ref 1.7–7.7)
RDW: 14.4 % (ref 11.5–15.5)

## 2013-07-11 LAB — BASIC METABOLIC PANEL
BUN: 30 mg/dL — ABNORMAL HIGH (ref 6–23)
CO2: 23 mEq/L (ref 19–32)
Calcium: 8.7 mg/dL (ref 8.4–10.5)
Chloride: 96 mEq/L (ref 96–112)
Creatinine, Ser: 2.98 mg/dL — ABNORMAL HIGH (ref 0.50–1.35)
GFR calc Af Amer: 26 mL/min — ABNORMAL LOW (ref >90)
Glucose, Bld: 103 mg/dL — ABNORMAL HIGH (ref 70–99)
Sodium: 132 mEq/L — ABNORMAL LOW (ref 135–145)

## 2013-07-11 LAB — CG4 I-STAT (LACTIC ACID): Lactic Acid, Venous: 0.93 mmol/L (ref 0.5–2.2)

## 2013-07-11 LAB — MRSA PCR SCREENING: MRSA by PCR: NEGATIVE

## 2013-07-11 MED ORDER — SODIUM CHLORIDE 0.9 % IV BOLUS (SEPSIS)
1000.0000 mL | Freq: Once | INTRAVENOUS | Status: AC
  Administered 2013-07-11: 1000 mL via INTRAVENOUS

## 2013-07-11 MED ORDER — SODIUM CHLORIDE 0.9 % IV SOLN
INTRAVENOUS | Status: DC
  Administered 2013-07-11: 23:00:00 via INTRAVENOUS
  Administered 2013-07-12 – 2013-07-13 (×2): 1000 mL via INTRAVENOUS
  Administered 2013-07-14 – 2013-07-15 (×2): via INTRAVENOUS

## 2013-07-11 MED ORDER — HYDROMORPHONE HCL PF 1 MG/ML IJ SOLN
1.0000 mg | Freq: Once | INTRAMUSCULAR | Status: AC
  Administered 2013-07-11: 1 mg via INTRAVENOUS
  Filled 2013-07-11: qty 1

## 2013-07-11 MED ORDER — ATORVASTATIN CALCIUM 80 MG PO TABS
80.0000 mg | ORAL_TABLET | Freq: Every day | ORAL | Status: DC
  Administered 2013-07-11 – 2013-07-17 (×7): 80 mg via ORAL
  Filled 2013-07-11 (×8): qty 1

## 2013-07-11 MED ORDER — OXYCODONE HCL 5 MG PO TABS
30.0000 mg | ORAL_TABLET | Freq: Four times a day (QID) | ORAL | Status: DC
  Administered 2013-07-11: 30 mg via ORAL
  Filled 2013-07-11 (×3): qty 6

## 2013-07-11 MED ORDER — ENOXAPARIN SODIUM 30 MG/0.3ML ~~LOC~~ SOLN: 30.0000 mg | SUBCUTANEOUS | Status: DC

## 2013-07-11 MED ORDER — PIPERACILLIN-TAZOBACTAM 3.375 G IVPB
3.3750 g | Freq: Three times a day (TID) | INTRAVENOUS | Status: DC
  Administered 2013-07-11 – 2013-07-17 (×18): 3.375 g via INTRAVENOUS
  Filled 2013-07-11 (×21): qty 50

## 2013-07-11 MED ORDER — MORPHINE SULFATE ER 30 MG PO TBCR
30.0000 mg | EXTENDED_RELEASE_TABLET | Freq: Two times a day (BID) | ORAL | Status: DC
  Filled 2013-07-11: qty 1

## 2013-07-11 MED ORDER — CLONAZEPAM 0.5 MG PO TABS
2.0000 mg | ORAL_TABLET | Freq: Four times a day (QID) | ORAL | Status: DC
  Administered 2013-07-11 – 2013-07-14 (×12): 2 mg via ORAL
  Filled 2013-07-11 (×13): qty 4

## 2013-07-11 MED ORDER — ENOXAPARIN SODIUM 60 MG/0.6ML ~~LOC~~ SOLN
60.0000 mg | SUBCUTANEOUS | Status: DC
  Administered 2013-07-11: 60 mg via SUBCUTANEOUS
  Filled 2013-07-11 (×3): qty 0.6

## 2013-07-11 MED ORDER — CLINDAMYCIN PHOSPHATE 600 MG/50ML IV SOLN
600.0000 mg | Freq: Once | INTRAVENOUS | Status: AC
  Administered 2013-07-11: 600 mg via INTRAVENOUS
  Filled 2013-07-11: qty 50

## 2013-07-11 MED ORDER — ONDANSETRON HCL 4 MG/2ML IJ SOLN
4.0000 mg | Freq: Once | INTRAMUSCULAR | Status: AC
  Administered 2013-07-11: 4 mg via INTRAVENOUS
  Filled 2013-07-11: qty 2

## 2013-07-11 MED ORDER — VANCOMYCIN HCL 10 G IV SOLR
1750.0000 mg | INTRAVENOUS | Status: DC
  Administered 2013-07-11 – 2013-07-12 (×2): 1750 mg via INTRAVENOUS
  Filled 2013-07-11 (×3): qty 1750

## 2013-07-11 NOTE — ED Provider Notes (Signed)
CSN: [id number]     Arrival date & time 07/11/13  1108 History   First MD Initiated Contact with Patient 07/11/13 1139     Chief Complaint  Patient presents with  . Abscess   (Consider location/radiation/quality/duration/timing/severity/associated sxs/prior Treatment) HPI Comments: Patient is a 57 year old male with history of hypertension who presents today with a worsening cellulitis. He was evaluated 2 days ago at [facility] and diagnosed with a cellulitis. He was given Keflex and Bactrim which he has been compliant with. Initially this rash began and was gradually worsening over the past 3 weeks. No trauma to the area. A limited bedside ultrasound was done 2 days ago which showed a very small fluid collection. An incision and drainage was attempted. There was no purulent drainage. The patient reports that since that time the cellulitis has doubled in size. He has associated confusion. He misplaced his medications including his chronic pain medication. He denies any fever, chills, nausea, vomiting, abdominal pain.  Patient is a 57 y.o. male presenting with abscess. The history is provided by the patient. No language interpreter was used.  Abscess Associated symptoms: no fever, no nausea and no vomiting     Past Medical History  Diagnosis Date  . Hypertension    History reviewed. No pertinent past surgical history. History reviewed. No pertinent family history. History  Substance Use Topics  . Smoking status: Never Smoker   . Smokeless tobacco: Not on file  . Alcohol Use: Not on file    Review of Systems  Constitutional: Negative for fever and chills.  Respiratory: Negative for shortness of breath.   Cardiovascular: Negative for chest pain.  Gastrointestinal: Negative for nausea, vomiting and abdominal pain.  Musculoskeletal: Positive for arthralgias and myalgias.  Skin: Positive for rash and wound.  All other systems reviewed and are negative.    Allergies   Review of patient's allergies indicates no known allergies.  Home Medications   Current Outpatient Rx  Name  Route  Sig  Dispense  Refill  . atenolol (TENORMIN) 100 MG tablet   Oral   Take 100 mg by mouth daily.         Marland Kitchen atorvastatin (LIPITOR) 80 MG tablet   Oral   Take 80 mg by mouth daily.         . cephALEXin (KEFLEX) 500 MG capsule   Oral   Take 500 mg by mouth 4 (four) times daily.         . clonazePAM (KLONOPIN) 2 MG tablet   Oral   Take 2 mg by mouth 4 (four) times daily.         . Diclofenac Sodium (PENNSAID) 1.5 % SOLN   Transdermal   Place 8 drops onto the skin 2 (two) times daily. *4 drops onto both knees*         . diphenhydrAMINE (BENADRYL) 25 mg capsule   Oral   Take 25 mg by mouth daily as needed for allergies.         Marland Kitchen oxycodone (ROXICODONE) 30 MG immediate release tablet   Oral   Take 30 mg by mouth 4 (four) times daily.          Marland Kitchen oxymorphone (OPANA ER) 30 MG 12 hr tablet   Oral   Take 30 mg by mouth every 12 (twelve) hours.         Marland Kitchen sulfamethoxazole-trimethoprim (BACTRIM DS) 800-160 MG per tablet   Oral   Take 1 tablet by mouth 2 (two)  times daily.          BP 97/56  Pulse 90  Temp(Src) 97.4 F (36.3 C) (Oral)  Resp 20  Ht 6\' 4"  (1.93 m)  Wt 270 lb 11.2 oz (122.789 kg)  BMI 32.96 kg/m2  SpO2 98% Physical Exam  Nursing note and vitals reviewed. Constitutional: He is oriented to person, place, and time. He appears well-developed and well-nourished. No distress.  HENT:  Head: Normocephalic and atraumatic.  Right Ear: External ear normal.  Left Ear: External ear normal.  Nose: Nose normal.  Eyes: Conjunctivae are normal.  Neck: Normal range of motion. No tracheal deviation present.  Cardiovascular: Normal rate, regular rhythm and normal heart sounds.   Pulmonary/Chest: Effort normal and breath sounds normal. No stridor.  Abdominal: Soft. He exhibits no distension. There is no tenderness.  Musculoskeletal: Normal  range of motion.  Neurological: He is alert and oriented to person, place, and time.  Skin: Skin is warm and dry. He is not diaphoretic.  15 cm area of erythema and induration on the left anterior thigh. There is streaking and erythema spreads to posterior aspect. Swelling of left knee. Compartment soft.   Psychiatric: He has a normal mood and affect. His behavior is normal.    ED Course  Procedures (including critical care time)  12:38 PM beside ultrasound performed which shows no drainable abscess or fluid collection.  Labs Review Labs Reviewed  BASIC METABOLIC PANEL - Abnormal; Notable for the following:    Sodium 132 (*)    Glucose, Bld 103 (*)    BUN 30 (*)    Creatinine, Ser 2.98 (*)    GFR calc non Af Amer 22 (*)    GFR calc Af Amer 26 (*)    All other components within normal limits  CBC WITH DIFFERENTIAL - Abnormal; Notable for the following:    WBC 25.6 (*)    RBC 3.22 (*)    Hemoglobin 9.6 (*)    HCT 29.0 (*)    Platelets 402 (*)    Neutrophils Relative % 84 (*)    Lymphocytes Relative 8 (*)    Neutro Abs 21.5 (*)    Monocytes Absolute 1.8 (*)    All other components within normal limits  CULTURE, BLOOD (ROUTINE X 2)  CULTURE, BLOOD (ROUTINE X 2)  CG4 I-STAT (LACTIC ACID)   Imaging Review No results found.  EKG Interpretation    Date/Time:  Tuesday July 11 2013 15:52:29 EST Ventricular Rate:  99 PR Interval:  172 QRS Duration: 102 QT Interval:  332 QTC Calculation: 426 R Axis:   12 Text Interpretation:  Sinus rhythm RSR' in V1 or V2, right VCD or RVH Baseline wander in lead(s) V6 No significant change since last tracing Confirmed by WARD  DO, KRISTEN ([id number]) on 07/11/2013 4:06:34 PM            MDM  No diagnosis found.  Pt presents with worsening cellulitis. Pt meets sepsis criteria. Lactic acid normal. Pt mentating well here. Tx'd with fluid bolus and IV clindamycin. Home pain meds ordered. Discussed case with internal medicine who agree to  admit. Admission is appreciated. Pt has been hypotensive in the ED, but is stable for transfer to the floor. Discussed case with Dr. Elesa Massed who agrees with plan.   Medications  clonazePAM (KLONOPIN) tablet 2 mg (not administered)  atorvastatin (LIPITOR) tablet 80 mg (not administered)  enoxaparin (LOVENOX) injection 60 mg (not administered)  HYDROmorphone (DILAUDID) injection 1 mg (1 mg Intravenous Given  07/11/13 1210)  ondansetron (ZOFRAN) injection 4 mg (4 mg Intravenous Given 07/11/13 1209)  clindamycin (CLEOCIN) IVPB 600 mg (0 mg Intravenous Stopped 07/11/13 1242)  sodium chloride 0.9 % bolus 1,000 mL (0 mLs Intravenous Stopped 07/11/13 1309)  sodium chloride 0.9 % bolus 1,000 mL (0 mLs Intravenous Stopped 07/11/13 1510)      Mora Bellman, PA-C 07/11/13 1820

## 2013-07-11 NOTE — ED Notes (Signed)
attempted to give report, secretary sts RN in unavailable d/t trach care. And no other RNs are available at this time.

## 2013-07-11 NOTE — ED Notes (Signed)
Irving Burton, RN at bedside to look for 2nd line. Dahlia Client, PA notified unable to get bld cultures or 2nd line.

## 2013-07-11 NOTE — ED Notes (Signed)
Attempted to gain IV access and bld cultures. Unsuccessful. Will having another RN look.

## 2013-07-11 NOTE — H&P (Signed)
Date: 07/11/2013               Patient Name:  [name] MRN: [id number]  DOB: Nov 03, 1955 Age / Sex: 57 y.o., male   PCP: [name], MD         Medical Service: Internal Medicine Teaching Service         Attending Physician: Dr. Layla Maw Ward, DO    First Contact: Dr. Mariea Clonts Pager: [phone]  Second Contact: Dr. Zada Girt Pager: [phone]       After Hours (After 5p/  First Contact Pager: [phone]  weekends / holidays): Second Contact Pager: [phone]   Chief Complaint: Left thigh swelling.  History of Present Illness: [name] is a 40 y o Male wih PMH of bilateral knee replacement, HTN, BPH and anxiety, Chronic back pain, who presented to the ED today with c/o of Left thigh swelling that started about 5 days ago.  Initaily started as a cyst/swelling that gradually became red and  Painful . (As per chart review, pt presented to the ED, and stated that the duration of swelling was- 3 weeks, an I and D was done, no purulent drainage) But pt said he went to [place], when the  Cyst became red and painful,  Had an I and D done, and was discharged home on antibiotics- Keflex and bactrim, which he has reported been taking since 12/14 with no improvement. He came to the ED today because the leg became increasingly painful, erythematous and swollen. He denies any recent trauma, insect bites and has no pets at home. He endorses chills , subjective fever, and aching. He also reports nausea and decreased appetite . He denies chest pain, palpitations, SOB, headache or neck stiffness. He denies diarrhea and reports no dysuria or changes in urinary urgency or frequency, not a known diabetic. Patient also reports that he noticed being "confused" on Sunday (12/14). He explains that he could not remember where he had put his medications. He says that his wife and sons also noticed that he was acting "forgetful". He reports that this has improved since Sunday, but still endorses feeling  confused at times. He denies feeling dizzy or lightheaded. He denies changes in vision or hearing. He reports taking high doses of opiates for the past 8 years for chronic pain for his knees s/p bilateral knee replacement. Patient reports that his Dr. Naoma Diener in [facility] (physical medicine and rehabilitation) prescribes this. I Meds: Current Facility-Administered Medications  Medication Dose Route Frequency Provider Last Rate Last Dose  . morphine (MS CONTIN) 12 hr tablet 30 mg  30 mg Oral Q12H Hannah S Merrell, PA-C      . oxyCODONE (Oxy IR/ROXICODONE) immediate release tablet 30 mg  30 mg Oral QID Mora Bellman, PA-C       Current Outpatient Prescriptions  Medication Sig Dispense Refill  . atenolol (TENORMIN) 100 MG tablet Take 100 mg by mouth daily.      Marland Kitchen atorvastatin (LIPITOR) 80 MG tablet Take 80 mg by mouth daily.      . cephALEXin (KEFLEX) 500 MG capsule Take 500 mg by mouth 4 (four) times daily.      . clonazePAM (KLONOPIN) 2 MG tablet Take 2 mg by mouth 4 (four) times daily.      . Diclofenac Sodium (PENNSAID) 1.5 % SOLN Place 8 drops onto the skin 2 (two) times daily. *4 drops onto both knees*      .  diphenhydrAMINE (BENADRYL) 25 mg capsule Take 25 mg by mouth daily as needed for allergies.      Marland Kitchen oxycodone (ROXICODONE) 30 MG immediate release tablet Take 30 mg by mouth 4 (four) times daily.       Marland Kitchen oxymorphone (OPANA ER) 30 MG 12 hr tablet Take 30 mg by mouth every 12 (twelve) hours.      Marland Kitchen sulfamethoxazole-trimethoprim (BACTRIM DS) 800-160 MG per tablet Take 1 tablet by mouth 2 (two) times daily.        Allergies: Allergies as of 07/11/2013  . (No Known Allergies)   Past Medical History  Diagnosis Date  . Hypertension    History reviewed. No pertinent past surgical history. History reviewed. No pertinent family history. History   Social History  . Marital Status: Married    Spouse Name: N/A    Number of Children: N/A  . Years of Education: N/A    Occupational History  . Not on file.   Social History Main Topics  . Smoking status: Never Smoker   . Smokeless tobacco: Not on file  . Alcohol Use: Not on file  . Drug Use: Not on file  . Sexual Activity: Not on file   Other Topics Concern  . Not on file   Social History Narrative  . No narrative on file    Review of Systems: CONSTITUTIONAL- No Fever, but chills present, no weightloss, or change in, appetite. HEAD- No Headache, or dizziness. Mouth/throat- No Sorethroat, dentures, bleeding gums. RESPIRATORY- No Cough, or SOB. CARDIAC- No palpitations, DOE, PND,or chest pain. GI- No Dysphagia, nausea, vomiting, diarrhoea, constipation, or abd pain. URINARY- Has hesitancy, BPH. NEUROLOGIC- No Numbness, syncope, burning.  Physical Exam: Blood pressure 109/72, pulse 131, temperature 98.6 F (37 C), temperature source Oral, resp. rate 20, height 6\' 4"  (1.93 m), weight 270 lb 11.2 oz (122.789 kg), SpO2 100.00%. GENERAL- alert, co-operative, appears as stated age, not in any distress. HEENT- Atraumatic, normocephalic, PERRL, EOMI, oral mucosa appears moist, no cervical LN enlargement, thyroid does not appear enlarged. CARDIAC- Tachycardia, regular, no murmurs, rubs or gallops. RESP- Moving equal volumes of air, and clear to auscultation bilaterally. ABDOMEN- Soft,non tender, no palpable masses or organomegaly, bowel sounds present. BACK- Normal curvature of the spine, No tenderness along the vertebrae, no CVA tenderness. NEURO- No obvious Cr N 2-12 abnormality, strenght equal and present in all extremities. EXTREMITIES- Left thigh- Erythematous- on ant and lat thigh- from proximal to distal thigh, marked induration with warmth, significant swelling, tender to palpation, swelling of knee-Left. Open Longitudinal incision- ~2cm, on the thigh, minimal serosanguinous drainage. Right thigh- Normal in appearance, no tenderness, erythema or swelling. SKIN- Warm, dry, No rash or  lesion. PSYCH- Normal mood and affect, appropriate thought content and speech.  Lab results: Basic Metabolic Panel:  Recent Labs  40/98/11 1149  NA 132*  K 4.2  CL 96  CO2 23  GLUCOSE 103*  BUN 30*  CREATININE 2.98*  CALCIUM 8.7   CBC:  Recent Labs  07/11/13 1149  WBC 25.6*  NEUTROABS 21.5*  HGB 9.6*  HCT 29.0*  MCV 90.1  PLT 402*    Assessment & Plan by Problem: Principal Problem:   Cellulitis of left lower extremity Active Problems:   Acute renal failure   Chronic pain syndrome   Testosterone deficiency  Cellulitis with Sepsis- Pt meets Criteria for SIRs- Tachycardia, tachypnea and leukocytosis- WBC- 25.6, with focus being the thigh. No known inciting factor for the cellulitis, not a known Diabetic. Failed  therapy with Keflex and Bactrim. Also consider Myositis and Consider necrotizing fascitis, also abscess formation. On admission to the Ed pt had Hypotension and received- 2L of N/s and had clindamycin- 600mg .  - Blood cultures- Pending - MRI of thigh- Awaiting. - Start Vancomycin- Per pharm - Start Zosyn- Per pharm - Odansetron 4mg  daily. - IVF n/s  Has gotten 3L, cont- 158mls/hr. - Regular diet. - Surgery consult.  ARF- No records to compare and get baseline. Cr- 2.98, BUN- 30. Likley due to sepsis. BUN/Cr ratio- 10. Pt has a hx of self reported hx BPH, consider CRF, due to obstructive uropathy. As per pt PCP, pt baseline is normal. - IVF n/s .  HLD- No lipid profile on chart. - Home meds, will continue on admission. Atorvastatin-80mg  daily  Chronic Knee pain- Status post bilat knee replacement surgery, on Opena- 30mg  BID, Oxycodone- 30mg  Q6h daily for the past 8 years.. - IV dilaudid- 1mg  X3 for pain  DVT ppx- Enoxaparin.    Dispo: Disposition is deferred at this time, awaiting improvement of current medical problems.   The patient does have a current PCP [name], MD) and does need an [facility] hospital follow-up appointment after  discharge.  The patient does not know have transportation limitations that hinder transportation to clinic appointments.  Signed: Kennis Carina, MD 07/11/2013, 3:54 PM

## 2013-07-11 NOTE — ED Notes (Signed)
Pt reports last week he noticed he had an abscess then on Sunday he had it drained and was put on antibiotics (keflex and sulfa) but the swelling and redness has gotten worse. Pt sts he feels more tired than normal, daughter reports sometimes he seems confused. Pt also concerned because he has had a total knee replacement in that leg and now his knee is swelling too. Pt sts he has been able to walk on it. Pt reports he is able to tolerate the pain but it has been hurting, he does go to a pain clinic. Nad, skin warm and dry, resp e/u.

## 2013-07-11 NOTE — ED Notes (Signed)
Pt reports having abscess to left thigh and went to [facility] on 12/14, they did i&d and gave pt antibiotics. Pt reports that the abscess has grown larger, more painful and having swelling to left knee.

## 2013-07-11 NOTE — ED Provider Notes (Signed)
Medical screening examination/treatment/procedure(s) were performed by non-physician practitioner and as supervising physician I was immediately available for consultation/collaboration.  EKG Interpretation    Date/Time:  Tuesday July 11 2013 15:52:29 EST Ventricular Rate:  99 PR Interval:  172 QRS Duration: 102 QT Interval:  332 QTC Calculation: 426 R Axis:   12 Text Interpretation:  Sinus rhythm RSR' in V1 or V2, right VCD or RVH Baseline wander in lead(s) V6 No significant change since last tracing Confirmed by WARD  DO, KRISTEN [id number]) on 07/11/2013 4:06:34 PM              Layla Maw Ward, DO 07/11/13 2043

## 2013-07-11 NOTE — Progress Notes (Signed)
ANTIBIOTIC CONSULT NOTE - INITIAL  Pharmacy Consult for vanc/zosyn Indication: Cellulitis  No Known Allergies  Patient Measurements: Height: 6\' 4"  (193 cm) Weight: 274 lb 11.1 oz (124.6 kg) IBW/kg (Calculated) : 86.8 Adjusted Body Weight:   Vital Signs: Temp: 99.9 F (37.7 C) (12/16 2005) Temp src: Oral (12/16 2005) BP: 94/54 mmHg (12/16 2015) Pulse Rate: 108 (12/16 2015) Intake/Output from previous day:   Intake/Output from this shift:    Labs:  Recent Labs  07/11/13 1149  WBC 25.6*  HGB 9.6*  PLT 402*  CREATININE 2.98*   Estimated Creatinine Clearance: 40.4 ml/min (by C-G formula based on Cr of 2.98). No results found for this basename: VANCOTROUGH, VANCOPEAK, VANCORANDOM, GENTTROUGH, GENTPEAK, GENTRANDOM, TOBRATROUGH, TOBRAPEAK, TOBRARND, AMIKACINPEAK, AMIKACINTROU, AMIKACIN,  in the last 72 hours   Microbiology: No results found for this or any previous visit (from the past 720 hour(s)).  Medical History: Past Medical History  Diagnosis Date  . Hypertension     Medications:  Prescriptions prior to admission  Medication Sig Dispense Refill  . atenolol (TENORMIN) 100 MG tablet Take 100 mg by mouth daily.      Marland Kitchen atorvastatin (LIPITOR) 80 MG tablet Take 80 mg by mouth daily.      . cephALEXin (KEFLEX) 500 MG capsule Take 500 mg by mouth 4 (four) times daily.      . clonazePAM (KLONOPIN) 2 MG tablet Take 2 mg by mouth 4 (four) times daily.      . Diclofenac Sodium (PENNSAID) 1.5 % SOLN Place 8 drops onto the skin 2 (two) times daily. *4 drops onto both knees*      . diphenhydrAMINE (BENADRYL) 25 mg capsule Take 25 mg by mouth daily as needed for allergies.      Marland Kitchen oxycodone (ROXICODONE) 30 MG immediate release tablet Take 30 mg by mouth 4 (four) times daily.       Marland Kitchen oxymorphone (OPANA ER) 30 MG 12 hr tablet Take 30 mg by mouth every 12 (twelve) hours.      Marland Kitchen sulfamethoxazole-trimethoprim (BACTRIM DS) 800-160 MG per tablet Take 1 tablet by mouth 2 (two) times  daily.       Scheduled:  . atorvastatin  80 mg Oral Daily  . clonazePAM  2 mg Oral QID  . enoxaparin (LOVENOX) injection  60 mg Subcutaneous Q24H  . sodium chloride  1,000 mL Intravenous Once   Assessment:  57 yo who was seen in the ED for thigh swelling. Pt said he had fevers but no chills. He had an I&D a few days ago but no improvement. Vanc and zosyn have been ordered for empiric coverage.   Goal of Therapy:  Vancomycin trough level 10-15 mcg/ml  Plan:   Vanc 1.75g IV q24 F/u with trough if needed Zosyn 3.375g IV q8

## 2013-07-11 NOTE — H&P (Signed)
I have seen and examined the patient myself, and I have reviewed the note by Floy Sabina, MS and was present during the interview and physical exam.  Please see my separate H&P for additional findings, assessment, and plan.  Signed: Kennis Carina, MD 07/14/2013, 2:02 PM     Chief Complaint: Left thigh swelling  History of Present Illness [name] is a 57 y/o with a history of bilateral knee replacement, HTN, BPH and anxiety who presents with left upper extremity swelling and erythema of 4 days duration. Patient reports that on Friday (12/12) he noticed a "cyst" on his left thigh. At first this did not concern him, so he decided to just watch it to see if it would improve. The "cyst" started to become red and painful. On Sunday (12/14) the patient went to Urgent Care in [facility]. He reports that this was incised and drained, but only small amount of fluid was removed. He was given Bactrim and Keflex for possible cellulitis. He reports taking this as prescribed since Sunday (12/14). He came to the ED today because the leg became increasingly painful, erythematous and swollen. He denies any recent trauma, insect bites and has no pets at home.  He endorses chills , subjective fever, and aching. He reports being able to walk on this leg, but the pain is worsened by knee flexion. He also reports nausea and decreased appetite (has not eaten in 2 days because of this). He was able to eat subway sandwhich that his daughter brought to him in ED. He denies chest pain, palpitations, SOB, headache or neck stiffness. He denies diarrhea and reports no dysuria or changes in urinary urgency or frequency.  Patient also reports that he noticed being "confused" on Sunday (12/14). He explains that he could not remember where he had put his medications. He says that his wife and sons also noticed that he was acting "forgetful". He reports that this has improved since Sunday, but still endorses feeling  confused at times.He denies feeling dizzy or lightheaded. He denies changes in vision or hearing. He reports taking high doses of opiates for the past 8 years for chronic pain for his knees s/p bilateral knee replacement. Patient reports that his Dr. Naoma Diener in [facility] (physical medicine and rehabilitation) prescribes.   In ED: Patient was given 2L NS in 3 hours due to hypotension. He also received Clindamycin 600 mg IV. He also received Dilautid 1 mg IV and Roxicodone 30 mg PO.    Review of Systems -  Review of Systems  Constitutional: Negative for diaphoresis.  HENT: Negative for congestion, ear discharge, ear pain and sore throat.   Eyes: Negative for pain and discharge.  Respiratory: Negative for cough, hemoptysis and wheezing.   Gastrointestinal: Negative for constipation and blood in stool.  Genitourinary:       Per HPI  Musculoskeletal:       Per HPI  Skin: Negative for itching and rash.  Neurological: Negative for focal weakness, seizures, loss of consciousness and weakness.  Endo/Heme/Allergies: Negative for polydipsia. Does not bruise/bleed easily.    Past Medical History: HTN Chronic Migraines BPH Kidney Stones- last was "years" ago Anxiety  Surgical History  Left total knee replacement- 2006 Right partial knee replacement- 2011 Bilateral rotator cuff reconstruction- left in 1999, right in 2000  Medications:  Testosterone injections- injects in shoulder or thigh, taking for past 4 years Klonopin- 2mg /day Atenolol  Opena ER- 30 mg BID Oxycodone 30 mg q6hr prn for  pain  Allergies:  NKDA  Family History:  Mother: HTN and ventricular arrythmia  Father: Lupus   Social History: Patient lives with his wife and their 2 sons in [place]. He works for [employer]  He denies use of cigarettes or other tobacco products. He denies alcohol or drug use.   Physical Exam: Physical Exam:  Blood pressure 109/72, pulse 131, temperature 98.6 F (37  C), temperature source Oral, resp. rate 20, height 6\' 4"  (1.93 m), weight 270 lb 11.2 oz (122.789 kg), SpO2 100.00%.  GENERAL- alert, co-operative, appears as stated age, not in any distress.  HEENT- Atraumatic, normocephalic, PERRL, EOMI, oral mucosa appears moist, no cervical LN enlargement, thyroid does not appear enlarged.  CARDIAC- Tachycardia, regular, no murmurs, rubs or gallops.  RESP- Moving equal volumes of air, and clear to auscultation bilaterally.  ABDOMEN- Soft,non tender, no palpable masses or organomegaly, bowel sounds present.  BACK- Normal curvature of the spine, No tenderness along the vertebrae, no CVA tenderness.  NEURO- No obvious Cr N 2-12 abnormality, strenght equal and present in all extremities.  EXTREMITIES- Left thigh- Erythematous- on ant and lat thigh- from proximal to distal thigh, marked induration with warmth, significant swelling, tender to palpation, swelling of knee-Left. Open Longitudinal incision- ~2cm, on the thigh, minimal serosanguinous drainage. Right thigh- Normal in appearance, no tenderness, erythema or swelling.  SKIN- Warm, dry, No rash or lesion.  PSYCH- Normal mood and affect, appropriate thought content and speech.  Labs:  Per review of records from PCP, patient has baseline Creatinine of 1.1   Lab results:  Basic Metabolic Panel:   Recent Labs   07/11/13 1149   NA  132*   K  4.2   CL  96   CO2  23   GLUCOSE  103*   BUN  30*   CREATININE  2.98*   CALCIUM  8.7    CBC:   Recent Labs   07/11/13 1149   WBC  25.6*   NEUTROABS  21.5*   HGB  9.6*   HCT  29.0*   MCV  90.1   PLT  402*

## 2013-07-12 ENCOUNTER — Encounter (HOSPITAL_COMMUNITY): Payer: Self-pay | Admitting: Anesthesiology

## 2013-07-12 ENCOUNTER — Inpatient Hospital Stay (HOSPITAL_COMMUNITY): Payer: Managed Care, Other (non HMO)

## 2013-07-12 ENCOUNTER — Encounter (HOSPITAL_COMMUNITY)
Admission: EM | Disposition: A | Discharge status: Home / Self Care | Payer: Self-pay | Source: Home / Self Care | Attending: Internal Medicine

## 2013-07-12 ENCOUNTER — Encounter (HOSPITAL_COMMUNITY): Payer: Managed Care, Other (non HMO) | Admitting: Anesthesiology

## 2013-07-12 ENCOUNTER — Other Ambulatory Visit: Payer: Self-pay | Admitting: Orthopedic Surgery

## 2013-07-12 ENCOUNTER — Inpatient Hospital Stay (HOSPITAL_COMMUNITY): Payer: Managed Care, Other (non HMO) | Admitting: Anesthesiology

## 2013-07-12 DIAGNOSIS — Z79891 Long term (current) use of opiate analgesic: Secondary | ICD-10-CM

## 2013-07-12 DIAGNOSIS — Z79899 Other long term (current) drug therapy: Secondary | ICD-10-CM

## 2013-07-12 DIAGNOSIS — A419 Sepsis, unspecified organism: Secondary | ICD-10-CM

## 2013-07-12 HISTORY — PX: I&D EXTREMITY: SHX5045

## 2013-07-12 LAB — COMPREHENSIVE METABOLIC PANEL
ALT: 39 U/L (ref 0–53)
AST: 27 U/L (ref 0–37)
Albumin: 1.9 g/dL — ABNORMAL LOW (ref 3.5–5.2)
Alkaline Phosphatase: 150 U/L — ABNORMAL HIGH (ref 39–117)
CO2: 20 mEq/L (ref 19–32)
Calcium: 7.7 mg/dL — ABNORMAL LOW (ref 8.4–10.5)
Chloride: 103 mEq/L (ref 96–112)
Creatinine, Ser: 2.34 mg/dL — ABNORMAL HIGH (ref 0.50–1.35)
GFR calc Af Amer: 34 mL/min — ABNORMAL LOW (ref >90)
GFR calc non Af Amer: 30 mL/min — ABNORMAL LOW (ref >90)
Potassium: 4.2 mEq/L (ref 3.5–5.1)
Sodium: 133 mEq/L — ABNORMAL LOW (ref 135–145)
Total Bilirubin: 0.3 mg/dL (ref 0.3–1.2)

## 2013-07-12 LAB — CBC WITH DIFFERENTIAL/PLATELET
Basophils Absolute: 0 10*3/uL (ref 0.0–0.1)
Basophils Relative: 0 % (ref 0–1)
Eosinophils Absolute: 0.1 10*3/uL (ref 0.0–0.7)
Eosinophils Relative: 1 % (ref 0–5)
Lymphs Abs: 1.6 10*3/uL (ref 0.7–4.0)
MCH: 29.8 pg (ref 26.0–34.0)
MCHC: 33.9 g/dL (ref 30.0–36.0)
MCV: 87.9 fL (ref 78.0–100.0)
Monocytes Relative: 9 % (ref 3–12)
Neutro Abs: 12.4 10*3/uL — ABNORMAL HIGH (ref 1.7–7.7)
Neutrophils Relative %: 79 % — ABNORMAL HIGH (ref 43–77)
Platelets: 287 10*3/uL (ref 150–400)
RDW: 14.6 % (ref 11.5–15.5)
WBC: 15.6 10*3/uL — ABNORMAL HIGH (ref 4.0–10.5)

## 2013-07-12 LAB — LACTIC ACID, PLASMA: Lactic Acid, Venous: 1 mmol/L (ref 0.5–2.2)

## 2013-07-12 LAB — C-REACTIVE PROTEIN: CRP: 18.6 mg/dL — ABNORMAL HIGH (ref <0.60)

## 2013-07-12 SURGERY — IRRIGATION AND DEBRIDEMENT EXTREMITY
Anesthesia: General | Site: Leg Upper | Laterality: Left

## 2013-07-12 MED ORDER — MIDAZOLAM HCL 5 MG/5ML IJ SOLN
INTRAMUSCULAR | Status: DC | PRN
  Administered 2013-07-12: 2 mg via INTRAVENOUS

## 2013-07-12 MED ORDER — SENNA 8.6 MG PO TABS
2.0000 | ORAL_TABLET | Freq: Two times a day (BID) | ORAL | Status: DC
  Administered 2013-07-14 – 2013-07-16 (×6): 17.2 mg via ORAL
  Filled 2013-07-12 (×11): qty 2

## 2013-07-12 MED ORDER — SODIUM CHLORIDE 0.9 % IR SOLN
Status: DC | PRN
  Administered 2013-07-12: 6000 mL

## 2013-07-12 MED ORDER — HYDROMORPHONE HCL PF 1 MG/ML IJ SOLN: 0.2500 mg | INTRAMUSCULAR | Status: DC | PRN

## 2013-07-12 MED ORDER — FENTANYL CITRATE 0.05 MG/ML IJ SOLN
INTRAMUSCULAR | Status: DC | PRN
  Administered 2013-07-12 (×2): 50 ug via INTRAVENOUS

## 2013-07-12 MED ORDER — PHENYLEPHRINE HCL 10 MG/ML IJ SOLN
INTRAMUSCULAR | Status: DC | PRN
  Administered 2013-07-12 (×5): 80 ug via INTRAVENOUS

## 2013-07-12 MED ORDER — SODIUM CHLORIDE 0.9 % IV BOLUS (SEPSIS)
500.0000 mL | Freq: Once | INTRAVENOUS | Status: AC
  Administered 2013-07-12: 500 mL via INTRAVENOUS

## 2013-07-12 MED ORDER — HYDROMORPHONE HCL PF 1 MG/ML IJ SOLN
1.0000 mg | INTRAMUSCULAR | Status: DC | PRN
  Administered 2013-07-12 (×2): 1 mg via INTRAVENOUS
  Filled 2013-07-12 (×2): qty 1

## 2013-07-12 MED ORDER — DOCUSATE SODIUM 100 MG PO CAPS
100.0000 mg | ORAL_CAPSULE | Freq: Two times a day (BID) | ORAL | Status: DC
  Administered 2013-07-13 – 2013-07-17 (×9): 100 mg via ORAL
  Filled 2013-07-12 (×12): qty 1

## 2013-07-12 MED ORDER — HYDROMORPHONE HCL PF 1 MG/ML IJ SOLN
INTRAMUSCULAR | Status: AC
  Filled 2013-07-12: qty 1

## 2013-07-12 MED ORDER — OXYCODONE HCL 5 MG PO TABS: 5.0000 mg | ORAL_TABLET | Freq: Once | ORAL | Status: DC | PRN

## 2013-07-12 MED ORDER — HYDROMORPHONE HCL PF 1 MG/ML IJ SOLN
1.0000 mg | Freq: Once | INTRAMUSCULAR | Status: AC
  Administered 2013-07-12: 1 mg via INTRAVENOUS
  Filled 2013-07-12: qty 1

## 2013-07-12 MED ORDER — HYDROMORPHONE HCL PF 1 MG/ML IJ SOLN
1.0000 mg | INTRAMUSCULAR | Status: DC | PRN
  Administered 2013-07-12 – 2013-07-13 (×7): 1 mg via INTRAVENOUS
  Filled 2013-07-12 (×6): qty 1

## 2013-07-12 MED ORDER — ONDANSETRON HCL 4 MG/2ML IJ SOLN: 4.0000 mg | Freq: Four times a day (QID) | INTRAMUSCULAR | Status: DC | PRN

## 2013-07-12 MED ORDER — ACETAMINOPHEN 325 MG PO TABS
ORAL_TABLET | ORAL | Status: AC
  Administered 2013-07-12: 975 mg via ORAL
  Filled 2013-07-12: qty 3

## 2013-07-12 MED ORDER — LIDOCAINE HCL (CARDIAC) 20 MG/ML IV SOLN
INTRAVENOUS | Status: DC | PRN
  Administered 2013-07-12: 50 mg via INTRAVENOUS

## 2013-07-12 MED ORDER — ONDANSETRON HCL 4 MG/2ML IJ SOLN
INTRAMUSCULAR | Status: DC | PRN
  Administered 2013-07-12: 4 mg via INTRAVENOUS

## 2013-07-12 MED ORDER — ONDANSETRON HCL 4 MG PO TABS: 4.0000 mg | ORAL_TABLET | Freq: Four times a day (QID) | ORAL | Status: DC | PRN

## 2013-07-12 MED ORDER — PROPOFOL 10 MG/ML IV BOLUS
INTRAVENOUS | Status: DC | PRN
  Administered 2013-07-12: 150 mg via INTRAVENOUS

## 2013-07-12 MED ORDER — LACTATED RINGERS IV SOLN
INTRAVENOUS | Status: DC | PRN
  Administered 2013-07-12 (×2): via INTRAVENOUS

## 2013-07-12 MED ORDER — OXYCODONE HCL 5 MG/5ML PO SOLN: 5.0000 mg | Freq: Once | ORAL | Status: DC | PRN

## 2013-07-12 SURGICAL SUPPLY — 56 items
BANDAGE CONFORM 3  STR LF (GAUZE/BANDAGES/DRESSINGS) ×2 IMPLANT
BANDAGE ELASTIC 4 VELCRO ST LF (GAUZE/BANDAGES/DRESSINGS) ×2 IMPLANT
BANDAGE ELASTIC 6 VELCRO ST LF (GAUZE/BANDAGES/DRESSINGS) ×2 IMPLANT
BANDAGE ESMARK 6X9 LF (GAUZE/BANDAGES/DRESSINGS) ×1 IMPLANT
BANDAGE GAUZE ELAST BULKY 4 IN (GAUZE/BANDAGES/DRESSINGS) ×2 IMPLANT
BLADE SURG 10 STRL SS (BLADE) ×2 IMPLANT
BNDG CMPR 9X6 STRL LF SNTH (GAUZE/BANDAGES/DRESSINGS) ×1
BNDG COHESIVE 4X5 TAN STRL (GAUZE/BANDAGES/DRESSINGS) ×2 IMPLANT
BNDG COHESIVE 6X5 TAN STRL LF (GAUZE/BANDAGES/DRESSINGS) ×3 IMPLANT
BNDG ESMARK 6X9 LF (GAUZE/BANDAGES/DRESSINGS) ×2
CHLORAPREP W/TINT 26ML (MISCELLANEOUS) ×2 IMPLANT
CLOTH BEACON ORANGE TIMEOUT ST (SAFETY) ×2 IMPLANT
COVER SURGICAL LIGHT HANDLE (MISCELLANEOUS) ×2 IMPLANT
CUFF TOURNIQUET SINGLE 34IN LL (TOURNIQUET CUFF) IMPLANT
CUFF TOURNIQUET SINGLE 44IN (TOURNIQUET CUFF) IMPLANT
DRAIN PENROSE 1/2X12 LTX STRL (WOUND CARE) IMPLANT
DRAPE U-SHAPE 47X51 STRL (DRAPES) ×2 IMPLANT
DRSG ADAPTIC 3X8 NADH LF (GAUZE/BANDAGES/DRESSINGS) ×2 IMPLANT
DRSG PAD ABDOMINAL 8X10 ST (GAUZE/BANDAGES/DRESSINGS) IMPLANT
ELECT REM PT RETURN 9FT ADLT (ELECTROSURGICAL) ×2
ELECTRODE REM PT RTRN 9FT ADLT (ELECTROSURGICAL) ×1 IMPLANT
GLOVE BIO SURGEON STRL SZ7 (GLOVE) ×2 IMPLANT
GLOVE BIO SURGEON STRL SZ8 (GLOVE) ×2 IMPLANT
GLOVE BIOGEL PI IND STRL 7.5 (GLOVE) ×1 IMPLANT
GLOVE BIOGEL PI IND STRL 8 (GLOVE) ×1 IMPLANT
GLOVE BIOGEL PI INDICATOR 7.5 (GLOVE) ×1
GLOVE BIOGEL PI INDICATOR 8 (GLOVE) ×1
GOWN PREVENTION PLUS XLARGE (GOWN DISPOSABLE) ×2 IMPLANT
GOWN STRL NON-REIN LRG LVL3 (GOWN DISPOSABLE) ×4 IMPLANT
KIT BASIN OR (CUSTOM PROCEDURE TRAY) ×2 IMPLANT
KIT ROOM TURNOVER OR (KITS) ×2 IMPLANT
MANIFOLD NEPTUNE II (INSTRUMENTS) ×2 IMPLANT
NS IRRIG 1000ML POUR BTL (IV SOLUTION) ×2 IMPLANT
PACK ORTHO EXTREMITY (CUSTOM PROCEDURE TRAY) ×2 IMPLANT
PAD ABD 8X10 STRL (GAUZE/BANDAGES/DRESSINGS) ×1 IMPLANT
PAD ARMBOARD 7.5X6 YLW CONV (MISCELLANEOUS) ×4 IMPLANT
PAD CAST 4YDX4 CTTN HI CHSV (CAST SUPPLIES) ×1 IMPLANT
PADDING CAST COTTON 4X4 STRL (CAST SUPPLIES) ×2
SOAP 2 % CHG 4 OZ (WOUND CARE) ×2 IMPLANT
SPONGE GAUZE 4X4 12PLY (GAUZE/BANDAGES/DRESSINGS) IMPLANT
SPONGE GAUZE 4X4 12PLY STER LF (GAUZE/BANDAGES/DRESSINGS) ×1 IMPLANT
STAPLER VISISTAT 35W (STAPLE) IMPLANT
STOCKINETTE IMPERVIOUS LG (DRAPES) ×1 IMPLANT
SUCTION FRAZIER TIP 10 FR DISP (SUCTIONS) ×2 IMPLANT
SUT ETHILON 3 0 FSL (SUTURE) IMPLANT
SUT PROLENE 3 0 PS 2 (SUTURE) ×2 IMPLANT
SUT VIC AB 2-0 CT1 27 (SUTURE) ×4
SUT VIC AB 2-0 CT1 TAPERPNT 27 (SUTURE) ×2 IMPLANT
SUT VIC AB 3-0 PS2 18 (SUTURE) ×2
SUT VIC AB 3-0 PS2 18XBRD (SUTURE) ×1 IMPLANT
TOWEL OR 17X24 6PK STRL BLUE (TOWEL DISPOSABLE) ×2 IMPLANT
TOWEL OR 17X26 10 PK STRL BLUE (TOWEL DISPOSABLE) ×2 IMPLANT
TUBE CONNECTING 12X1/4 (SUCTIONS) ×2 IMPLANT
TUBING CYSTO DISP (UROLOGICAL SUPPLIES) ×3 IMPLANT
WATER STERILE IRR 1000ML POUR (IV SOLUTION) ×2 IMPLANT
YANKAUER SUCT BULB TIP NO VENT (SUCTIONS) ×2 IMPLANT

## 2013-07-12 NOTE — Anesthesia Preprocedure Evaluation (Addendum)
Anesthesia Evaluation  Patient identified by MRN, date of birth, ID band Patient awake    Reviewed: Allergy & Precautions, H&P , NPO status , Patient's Chart, lab work & pertinent test results, reviewed documented beta blocker date and time   Airway Mallampati: II TM Distance: >3 FB Neck ROM: Full    Dental no notable dental hx. (+) Teeth Intact and Dental Advisory Given   Pulmonary neg pulmonary ROS,  breath sounds clear to auscultation  Pulmonary exam normal       Cardiovascular hypertension, On Medications and On Home Beta Blockers Rhythm:Regular Rate:Normal     Neuro/Psych negative neurological ROS  negative psych ROS   GI/Hepatic negative GI ROS, Neg liver ROS,   Endo/Other  negative endocrine ROS  Renal/GU Renal disease  negative genitourinary   Musculoskeletal   Abdominal   Peds  Hematology negative hematology ROS (+)   Anesthesia Other Findings   Reproductive/Obstetrics negative OB ROS                          Anesthesia Physical Anesthesia Plan  ASA: II  Anesthesia Plan: General   Post-op Pain Management:    Induction: Intravenous  Airway Management Planned: LMA  Additional Equipment:   Intra-op Plan:   Post-operative Plan: Extubation in OR  Informed Consent: I have reviewed the patients History and Physical, chart, labs and discussed the procedure including the risks, benefits and alternatives for the proposed anesthesia with the patient or authorized representative who has indicated his/her understanding and acceptance.   Dental advisory given  Plan Discussed with: CRNA  Anesthesia Plan Comments:        Anesthesia Quick Evaluation

## 2013-07-12 NOTE — Anesthesia Postprocedure Evaluation (Signed)
  Anesthesia Post-op Note  Patient: [name]  Procedure(s) Performed: Procedure(s): I&D Left Thigh Abscess (Left)  Patient Location: PACU  Anesthesia Type:General  Level of Consciousness: awake  Airway and Oxygen Therapy: Patient Spontanous Breathing  Post-op Pain: mild  Post-op Assessment: Post-op Vital signs reviewed  Post-op Vital Signs: stable  Complications: No apparent anesthesia complications

## 2013-07-12 NOTE — Consult Note (Signed)
Reason for Consult: Lower extremity cellulitis Referring Physician: Rena Sweeden is an 57 y.o. male.  HPI: Pt with hx of left TKA 9 years ago and right partial knee replacement 3 years ago was seen and evaluated in [facility] ER and subsequently admitted for cellulitis of the left thigh.  Pt states he noticed a cyst on his left thigh several days ago that had gotten bigger.  He went to local urgent care center where it was drained.  A day later pt reports area of cyst became red, warm, swollen and painful.  At that time pt reported to ED for treatment.  Pt denies N/V/F/C, chest pain, SOB, calf pain, or paresthesia bilaterally.   Past Medical History  Diagnosis Date  . Hypertension     History reviewed. No pertinent past surgical history.  History reviewed. No pertinent family history.  Social History:  reports that he has never smoked. He does not have any smokeless tobacco history on file. His alcohol and drug histories are not on file.  Allergies: No Known Allergies  Medications: I have reviewed the patient's current medications.  Results for orders placed during the hospital encounter of 07/11/13 (from the past 48 hour(s))  BASIC METABOLIC PANEL     Status: Abnormal   Collection Time    07/11/13 11:49 AM      Result Value Range   Sodium 132 (*) 135 - 145 mEq/L   Potassium 4.2  3.5 - 5.1 mEq/L   Chloride 96  96 - 112 mEq/L   CO2 23  19 - 32 mEq/L   Glucose, Bld 103 (*) 70 - 99 mg/dL   BUN 30 (*) 6 - 23 mg/dL   Creatinine, Ser 4.09 (*) 0.50 - 1.35 mg/dL   Calcium 8.7  8.4 - 81.1 mg/dL   GFR calc non Af Amer 22 (*) >90 mL/min   GFR calc Af Amer 26 (*) >90 mL/min   Comment: (NOTE)     The eGFR has been calculated using the CKD EPI equation.     This calculation has not been validated in all clinical situations.     eGFR's persistently <90 mL/min signify possible Chronic Kidney     Disease.  CBC WITH DIFFERENTIAL     Status: Abnormal   Collection Time   07/11/13 11:49 AM      Result Value Range   WBC 25.6 (*) 4.0 - 10.5 K/uL   RBC 3.22 (*) 4.22 - 5.81 MIL/uL   Hemoglobin 9.6 (*) 13.0 - 17.0 g/dL   HCT 91.4 (*) 78.2 - 95.6 %   MCV 90.1  78.0 - 100.0 fL   MCH 29.8  26.0 - 34.0 pg   MCHC 33.1  30.0 - 36.0 g/dL   RDW 21.3  08.6 - 57.8 %   Platelets 402 (*) 150 - 400 K/uL   Neutrophils Relative % 84 (*) 43 - 77 %   Lymphocytes Relative 8 (*) 12 - 46 %   Monocytes Relative 7  3 - 12 %   Eosinophils Relative 1  0 - 5 %   Basophils Relative 0  0 - 1 %   Neutro Abs 21.5 (*) 1.7 - 7.7 K/uL   Lymphs Abs 2.0  0.7 - 4.0 K/uL   Monocytes Absolute 1.8 (*) 0.1 - 1.0 K/uL   Eosinophils Absolute 0.3  0.0 - 0.7 K/uL   Basophils Absolute 0.0  0.0 - 0.1 K/uL   WBC Morphology WHITE COUNT CONFIRMED ON  SMEAR     Comment: FEW ATYPICAL LYMPHS NOTED  CG4 I-STAT (LACTIC ACID)     Status: None   Collection Time    07/11/13  1:20 PM      Result Value Range   Lactic Acid, Venous 0.93  0.5 - 2.2 mmol/L  CULTURE, BLOOD (ROUTINE X 2)     Status: None   Collection Time    07/11/13  3:30 PM      Result Value Range   Specimen Description BLOOD ARM RIGHT     Special Requests BOTTLES DRAWN AEROBIC ONLY 2.5CC     Culture  Setup Time       Value: 07/11/2013 20:22     Performed at [facility]   Culture       Value:        BLOOD CULTURE RECEIVED NO GROWTH TO DATE CULTURE WILL BE HELD FOR 5 DAYS BEFORE ISSUING A FINAL NEGATIVE REPORT     Performed at [facility]   Report Status PENDING    MRSA PCR SCREENING     Status: None   Collection Time    07/11/13  6:24 PM      Result Value Range   MRSA by PCR NEGATIVE  NEGATIVE   Comment:            The GeneXpert MRSA Assay (FDA     approved for NASAL specimens     only), is one component of a     comprehensive MRSA colonization     surveillance program. It is not     intended to diagnose MRSA     infection nor to guide or     monitor treatment for     MRSA infections.  COMPREHENSIVE METABOLIC  PANEL     Status: Abnormal   Collection Time    07/12/13  5:19 AM      Result Value Range   Sodium 133 (*) 135 - 145 mEq/L   Potassium 4.2  3.5 - 5.1 mEq/L   Chloride 103  96 - 112 mEq/L   CO2 20  19 - 32 mEq/L   Glucose, Bld 105 (*) 70 - 99 mg/dL   BUN 25 (*) 6 - 23 mg/dL   Creatinine, Ser 1.61 (*) 0.50 - 1.35 mg/dL   Calcium 7.7 (*) 8.4 - 10.5 mg/dL   Total Protein 5.6 (*) 6.0 - 8.3 g/dL   Albumin 1.9 (*) 3.5 - 5.2 g/dL   AST 27  0 - 37 U/L   ALT 39  0 - 53 U/L   Alkaline Phosphatase 150 (*) 39 - 117 U/L   Total Bilirubin 0.3  0.3 - 1.2 mg/dL   GFR calc non Af Amer 30 (*) >90 mL/min   GFR calc Af Amer 34 (*) >90 mL/min   Comment: (NOTE)     The eGFR has been calculated using the CKD EPI equation.     This calculation has not been validated in all clinical situations.     eGFR's persistently <90 mL/min signify possible Chronic Kidney     Disease.  CBC WITH DIFFERENTIAL     Status: Abnormal   Collection Time    07/12/13  5:19 AM      Result Value Range   WBC 15.6 (*) 4.0 - 10.5 K/uL   RBC 3.22 (*) 4.22 - 5.81 MIL/uL   Hemoglobin 9.6 (*) 13.0 - 17.0 g/dL   HCT 09.6 (*) 04.5 - 40.9 %   MCV 87.9  78.0 - 100.0 fL   MCH 29.8  26.0 - 34.0 pg   MCHC 33.9  30.0 - 36.0 g/dL   RDW 16.1  09.6 - 04.5 %   Platelets 287  150 - 400 K/uL   Comment: DELTA CHECK NOTED     REPEATED TO VERIFY   Neutrophils Relative % 79 (*) 43 - 77 %   Neutro Abs 12.4 (*) 1.7 - 7.7 K/uL   Lymphocytes Relative 11 (*) 12 - 46 %   Lymphs Abs 1.6  0.7 - 4.0 K/uL   Monocytes Relative 9  3 - 12 %   Monocytes Absolute 1.4 (*) 0.1 - 1.0 K/uL   Eosinophils Relative 1  0 - 5 %   Eosinophils Absolute 0.1  0.0 - 0.7 K/uL   Basophils Relative 0  0 - 1 %   Basophils Absolute 0.0  0.0 - 0.1 K/uL  LACTIC ACID, PLASMA     Status: None   Collection Time    07/12/13  5:19 AM      Result Value Range   Lactic Acid, Venous 1.0  0.5 - 2.2 mmol/L    Mr Femur Left Wo Contrast  07/12/2013   CLINICAL DATA:  Fluid  collection.  Left thigh swelling and redness.  EXAM: MR OF THE LEFT FEMUR WITHOUT CONTRAST  TECHNIQUE: Multiplanar, multisequence MR imaging was performed. No intravenous contrast was administered.  COMPARISON:  None.  FINDINGS: There is a large elongated fluid collection in the proximal left vastus lateralis. Edema radiates distally in the left vastus lateralis and there is subcutaneous edema diffusely around the left thigh, likely representing cellulitis. Fluid collection measures 7 cm AP x 5.6 cm transverse. Craniocaudal extent is 19 cm. Small foci of susceptibility artifact are present in the anterior portions, potentially representing metal or gas. Reactive edema is present in the rectus femoris muscle. The fluid collection lies deep to the iliotibial band and the origin of the fluid is at the distal extent of the tensor fascia lata muscle.  Posterior compartment appears normal. The femur appears normal without erosion or intra medullary edema.  Surgical clips are noted in the left inguinal region.  IMPRESSION: 1. Large fluid collection in the proximal lateral left thigh consistent with abscess. This is in the proximal left vastus lateralis, with the proximal extent at the inferior margin of the tensor fascia lata muscle. The collection is in both the subcutaneous tissues and vastus lateralis muscle belly. 2. Left thigh cellulitis.   Electronically Signed   By: Andreas Newport M.D.   On: 07/12/2013 11:38    Review of Systems  Constitutional: Negative.   HENT: Negative.   Eyes: Negative.   Respiratory: Negative.   Cardiovascular: Negative.   Gastrointestinal: Negative.   Genitourinary: Negative.   Musculoskeletal: Negative.   Skin: Negative.   Neurological: Negative.   Endo/Heme/Allergies: Negative.   Psychiatric/Behavioral: The patient is not nervous/anxious.    Blood pressure 100/56, pulse 90, temperature 99.1 F (37.3 C), temperature source Oral, resp. rate 18, height 6\' 4"  (1.93 m), weight  124.6 kg (274 lb 11.1 oz), SpO2 99.00%. Physical Exam  Constitutional: He is oriented to person, place, and time. He appears well-developed and well-nourished.  HENT:  Head: Normocephalic and atraumatic.  Eyes: EOM are normal.  Cardiovascular: Normal rate and regular rhythm.   Pulses:      Dorsalis pedis pulses are 2+ on the right side, and 2+ on the left side.  Respiratory: Effort normal.  Musculoskeletal: Normal range of  motion.       Left knee: He exhibits normal range of motion, no effusion, no ecchymosis, no deformity, no erythema and no bony tenderness. No tenderness found.  Neurological: He is alert and oriented to person, place, and time. No sensory deficit.  Skin:     Left anterio-lateral thigh: Localized erythema, edema, taught, skin with exquisite TTP consistent with cellulitis.  Small open ulcer in skin within cellulitis, appears to be source of infection.  No drainage, present from ulcer.   Left leg: Entire left leg is mildly edematous compared to contralateral side.  No erythema or TTP outside of area of cellulitis at left anterior lateral thigh.    Assessment/Plan: Had detailed discussion with pt and wife regarding incision and drainage of left thigh abscess to be performed by Dr. Victorino Dike.  I discussed surgical and non-surgical options for treatment of the abscess and pt has elected to proceed with surgery.  I do not believe the knee has been affected by the abscess and cellulitis of the left thigh and opted not to perform arthrocentesis for risk of exposure. The risks and benefits of the alternative treatment options have been discussed in detail.  The patient wishes to proceed with surgery and specifically understands risks of bleeding, infection, nerve damage, blood clots, need for additional surgery, amputation and death.    FLOWERS, CHRISTOPHER S 07/12/2013, 1:58 PM   I've taken the patient's history and examined him.  He has a left thigh abscess and requires surgical  drainage.  The risks and benefits of the alternative treatment options have been discussed in detail.  The patient wishes to proceed with surgery and specifically understands risks of bleeding, infection, nerve damage, blood clots, need for additional surgery, amputation and death.

## 2013-07-12 NOTE — Brief Op Note (Signed)
07/11/2013 - 07/12/2013  5:51 PM  PATIENT:  [name]  57 y.o. male  PRE-OPERATIVE DIAGNOSIS:  Left thigh deep abscess  POST-OPERATIVE DIAGNOSIS:  same  Procedure(s): Irrigation and excisional debridement of left thigh deep abscess including skin, subcutaneous tissue and muscle  SURGEON:  [name], MD  ASSISTANT: n/a  ANESTHESIA:   General  EBL:  300 cc  TOURNIQUET:  n/a  COMPLICATIONS:  None apparent  DISPOSITION:  Extubated, awake and stable to recovery.  Specimen:  Deep tissue to micro and path  DICTATION ID:  [id number]

## 2013-07-12 NOTE — Progress Notes (Signed)
Utilization Review Completed.[name] T12/17/2014  

## 2013-07-12 NOTE — Preoperative (Signed)
Beta Blockers   Reason not to administer Beta Blockers:Not Applicable 

## 2013-07-12 NOTE — Progress Notes (Signed)
Subjective: Lying in bed comfortable, complaints of pain in his Left thigh, reports some reduction in thigh swelling. Left knee also still appears swollen, with slight discolouration compared to the Right. No increased pain, pt can move joint, and bear weight ton joint.  Objective: Vital signs in last 24 hours: Filed Vitals:   07/12/13 0429 07/12/13 0500 07/12/13 0755 07/12/13 1223  BP:  112/51 101/53 100/56  Pulse:  94 100 90  Temp: 98.5 F (36.9 C)  99.1 F (37.3 C) 99.1 F (37.3 C)  TempSrc: Oral  Oral Oral  Resp:  21 18 18   Height:      Weight:      SpO2:  96% 99% 99%   Weight change:   Intake/Output Summary (Last 24 hours) at 07/12/13 1423 Last data filed at 07/12/13 1225  Gross per 24 hour  Intake   1430 ml  Output   1350 ml  Net     80 ml   General appearance: alert, cooperative, appears stated age and no distress Lungs: clear to auscultation bilaterally Heart: regular rate and rhythm, S1, S2 normal, no murmur, click, rub or gallop Abdomen: soft, non-tender; bowel sounds normal; no masses,  no organomegaly Extremities: Left Lower extremity- swelling left thigh extending from upper thigh to lower thigh, on anterior and lateral aspect, marked induration, warmth and erythema. Mild swelling of Left knee, range of motion limited by tightness of skin on upper thigh, No inguinal LN enlargement on exam, Rt LE- Normal in appearance, reduced DP pulses distally, but legs are warm..  Lab Results: Basic Metabolic Panel:  Recent Labs Lab 07/11/13 1149 07/12/13 0519  NA 132* 133*  K 4.2 4.2  CL 96 103  CO2 23 20  GLUCOSE 103* 105*  BUN 30* 25*  CREATININE 2.98* 2.34*  CALCIUM 8.7 7.7*   Liver Function Tests:  Recent Labs Lab 07/12/13 0519  AST 27  ALT 39  ALKPHOS 150*  BILITOT 0.3  PROT 5.6*  ALBUMIN 1.9*   CBC:  Recent Labs Lab 07/11/13 1149 07/12/13 0519  WBC 25.6* 15.6*  NEUTROABS 21.5* 12.4*  HGB 9.6* 9.6*  HCT 29.0* 28.3*  MCV 90.1 87.9  PLT  402* 287    Micro Results: Recent Results (from the past 240 hour(s))  CULTURE, BLOOD (ROUTINE X 2)     Status: None   Collection Time    07/11/13  3:30 PM      Result Value Range Status   Specimen Description BLOOD ARM RIGHT   Final   Special Requests BOTTLES DRAWN AEROBIC ONLY 2.5CC   Final   Culture  Setup Time     Final   Value: 07/11/2013 20:22     Performed at [facility]   Culture     Final   Value:        BLOOD CULTURE RECEIVED NO GROWTH TO DATE CULTURE WILL BE HELD FOR 5 DAYS BEFORE ISSUING A FINAL NEGATIVE REPORT     Performed at [facility]   Report Status PENDING   Incomplete  MRSA PCR SCREENING     Status: None   Collection Time    07/11/13  6:24 PM      Result Value Range Status   MRSA by PCR NEGATIVE  NEGATIVE Final   Comment:            The GeneXpert MRSA Assay (FDA     approved for NASAL specimens     only), is one component of a  comprehensive MRSA colonization     surveillance program. It is not     intended to diagnose MRSA     infection nor to guide or     monitor treatment for     MRSA infections.   Studies/Results: Mr Femur Left Wo Contrast  07/12/2013   CLINICAL DATA:  Fluid collection.  Left thigh swelling and redness.  EXAM: MR OF THE LEFT FEMUR WITHOUT CONTRAST  TECHNIQUE: Multiplanar, multisequence MR imaging was performed. No intravenous contrast was administered.  COMPARISON:  None.  FINDINGS: There is a large elongated fluid collection in the proximal left vastus lateralis. Edema radiates distally in the left vastus lateralis and there is subcutaneous edema diffusely around the left thigh, likely representing cellulitis. Fluid collection measures 7 cm AP x 5.6 cm transverse. Craniocaudal extent is 19 cm. Small foci of susceptibility artifact are present in the anterior portions, potentially representing metal or gas. Reactive edema is present in the rectus femoris muscle. The fluid collection lies deep to the iliotibial band and  the origin of the fluid is at the distal extent of the tensor fascia lata muscle.  Posterior compartment appears normal. The femur appears normal without erosion or intra medullary edema.  Surgical clips are noted in the left inguinal region.  IMPRESSION: 1. Large fluid collection in the proximal lateral left thigh consistent with abscess. This is in the proximal left vastus lateralis, with the proximal extent at the inferior margin of the tensor fascia lata muscle. The collection is in both the subcutaneous tissues and vastus lateralis muscle belly. 2. Left thigh cellulitis.   Electronically Signed   By: Andreas Newport M.D.   On: 07/12/2013 11:38   Medications: I have reviewed the patient's current medications. Scheduled Meds: . atorvastatin  80 mg Oral Daily  . clonazePAM  2 mg Oral QID  . enoxaparin (LOVENOX) injection  60 mg Subcutaneous Q24H  . piperacillin-tazobactam (ZOSYN)  IV  3.375 g Intravenous Q8H  . vancomycin  1,750 mg Intravenous Q24H   Continuous Infusions: . sodium chloride 100 mL/hr at 07/11/13 2238   PRN Meds:.HYDROmorphone (DILAUDID) injection Assessment/Plan: Principal Problem:   Cellulitis of left lower extremity Active Problems:   Acute renal failure   Chronic pain syndrome   Testosterone deficiency   Chronic use of opiate drugs therapeutic purposes  Cellulitis and Abscess with Sepsis- Pt meets Criteria for SIRs- Tachycardia, tachypnea and leukocytosis- WBC- 25.6, with focus being the thigh. No known inciting factor for the cellulitis, not a known Diabetic. CBC drwn this am, showed WBC trending down- 15.6.    - Blood cultures- Pending, but was on antibiotics-as an out-pt prior to samples been drawn.  - MRI of thigh- Large fluid collection in the proximal lateral left thigh consistent with abscess. - Continue Vancomycin- Per pharm  - Continue Zosyn- Per pharm  - Odansetron 4mg  daily.  - IVF n/s Has gotten 3L, cont- 129mls/hr.  - Regular diet.  - Ortho consulted,  appreciate recs, patient will have drainage of the abcess done.  - IV dilaudid for pain- 1mg  Q3H. - NPO for surgery.  ARF- No records to compare and get baseline. But pt reports he has never been told he has kidney problems. Cr- 2.34 today >> 2.98, BUN- 25 today >> 30. Some improvement. Likley due to sepsis. BUN/Cr ratio- 10. Pt has a hx of self reported hx BPH, consider CRF, due to obstructive uropathy. As per pt PCP, pt baseline is normal.  -Cont IVF n/s 143mls/hr -  Renal USS - Urinalysis  HLD- No lipid profile on chart.  - Home meds, will continue on admission. Atorvastatin-80mg  daily.   Chronic Knee pain- Status post bilat knee replacement surgery, on Opena- 30mg  BID, Oxycodone- 30mg  Q6h daily for the past 8 years..  - IV dilaudid- 1mg  Q3H for pain. - Currently NPO, no oral pain meds.  DVT ppx- Enoxaparin.   Dispo: Disposition is deferred at this time, awaiting improvement of current medical problems.    The patient does have a current PCP [name], MD) and does need an [facility] hospital follow-up appointment after discharge.  The patient does not know have transportation limitations that hinder transportation to clinic appointments.  .Services Needed at time of discharge: Y = Yes, Blank = No PT:   OT:   RN:   Equipment:   Other:     LOS: 1 day   Kennis Carina, MD 07/12/2013, 2:23 PM

## 2013-07-12 NOTE — H&P (Signed)
[name] is an 57 y.o. male.   Chief Complaint: Left thigh pain and swelling HPI: Pt with hx of left TKA 9 years ago and right partial knee replacement 3 years ago was seen and evaluated in [facility] ER and subsequently admitted for cellulitis of the left thigh.  Pt states he noticed a cyst on his left thigh several weeks ago that had gotten bigger.  He went to local urgent care center where it was drained.  He was given antibiotics.  A few days later pt reports area of cyst became red, warm, swollen and painful.  At that time pt reported to ED for treatment.  Pt denies N/V/F/C, chest pain, SOB, calf pain, or paresthesia bilaterally.   Past Medical History  Diagnosis Date  . Hypertension     History reviewed. No pertinent past surgical history.  History reviewed. No pertinent family history. Social History:  reports that he has never smoked. He does not have any smokeless tobacco history on file. His alcohol and drug histories are not on file.  Allergies: No Known Allergies  Medications Prior to Admission  Medication Sig Dispense Refill  . atenolol (TENORMIN) 100 MG tablet Take 100 mg by mouth daily.      Marland Kitchen atorvastatin (LIPITOR) 80 MG tablet Take 80 mg by mouth daily.      . cephALEXin (KEFLEX) 500 MG capsule Take 500 mg by mouth 4 (four) times daily.      . clonazePAM (KLONOPIN) 2 MG tablet Take 2 mg by mouth 4 (four) times daily.      . Diclofenac Sodium (PENNSAID) 1.5 % SOLN Place 8 drops onto the skin 2 (two) times daily. *4 drops onto both knees*      . diphenhydrAMINE (BENADRYL) 25 mg capsule Take 25 mg by mouth daily as needed for allergies.      Marland Kitchen oxycodone (ROXICODONE) 30 MG immediate release tablet Take 30 mg by mouth 4 (four) times daily.       Marland Kitchen oxymorphone (OPANA ER) 30 MG 12 hr tablet Take 30 mg by mouth every 12 (twelve) hours.      Marland Kitchen sulfamethoxazole-trimethoprim (BACTRIM DS) 800-160 MG per tablet Take 1 tablet by mouth 2 (two) times daily.        Results for orders  placed during the hospital encounter of 07/11/13 (from the past 48 hour(s))  BASIC METABOLIC PANEL     Status: Abnormal   Collection Time    07/11/13 11:49 AM      Result Value Range   Sodium 132 (*) 135 - 145 mEq/L   Potassium 4.2  3.5 - 5.1 mEq/L   Chloride 96  96 - 112 mEq/L   CO2 23  19 - 32 mEq/L   Glucose, Bld 103 (*) 70 - 99 mg/dL   BUN 30 (*) 6 - 23 mg/dL   Creatinine, Ser 9.60 (*) 0.50 - 1.35 mg/dL   Calcium 8.7  8.4 - 45.4 mg/dL   GFR calc non Af Amer 22 (*) >90 mL/min   GFR calc Af Amer 26 (*) >90 mL/min   Comment: (NOTE)     The eGFR has been calculated using the CKD EPI equation.     This calculation has not been validated in all clinical situations.     eGFR's persistently <90 mL/min signify possible Chronic Kidney     Disease.  CBC WITH DIFFERENTIAL     Status: Abnormal   Collection Time    07/11/13 11:49 AM  Result Value Range   WBC 25.6 (*) 4.0 - 10.5 K/uL   RBC 3.22 (*) 4.22 - 5.81 MIL/uL   Hemoglobin 9.6 (*) 13.0 - 17.0 g/dL   HCT 16.1 (*) 09.6 - 04.5 %   MCV 90.1  78.0 - 100.0 fL   MCH 29.8  26.0 - 34.0 pg   MCHC 33.1  30.0 - 36.0 g/dL   RDW 40.9  81.1 - 91.4 %   Platelets 402 (*) 150 - 400 K/uL   Neutrophils Relative % 84 (*) 43 - 77 %   Lymphocytes Relative 8 (*) 12 - 46 %   Monocytes Relative 7  3 - 12 %   Eosinophils Relative 1  0 - 5 %   Basophils Relative 0  0 - 1 %   Neutro Abs 21.5 (*) 1.7 - 7.7 K/uL   Lymphs Abs 2.0  0.7 - 4.0 K/uL   Monocytes Absolute 1.8 (*) 0.1 - 1.0 K/uL   Eosinophils Absolute 0.3  0.0 - 0.7 K/uL   Basophils Absolute 0.0  0.0 - 0.1 K/uL   WBC Morphology WHITE COUNT CONFIRMED ON SMEAR     Comment: FEW ATYPICAL LYMPHS NOTED  CG4 I-STAT (LACTIC ACID)     Status: None   Collection Time    07/11/13  1:20 PM      Result Value Range   Lactic Acid, Venous 0.93  0.5 - 2.2 mmol/L  CULTURE, BLOOD (ROUTINE X 2)     Status: None   Collection Time    07/11/13  3:30 PM      Result Value Range   Specimen Description BLOOD ARM  RIGHT     Special Requests BOTTLES DRAWN AEROBIC ONLY 2.5CC     Culture  Setup Time       Value: 07/11/2013 20:22     Performed at [facility]   Culture       Value:        BLOOD CULTURE RECEIVED NO GROWTH TO DATE CULTURE WILL BE HELD FOR 5 DAYS BEFORE ISSUING A FINAL NEGATIVE REPORT     Performed at [facility]   Report Status PENDING    MRSA PCR SCREENING     Status: None   Collection Time    07/11/13  6:24 PM      Result Value Range   MRSA by PCR NEGATIVE  NEGATIVE   Comment:            The GeneXpert MRSA Assay (FDA     approved for NASAL specimens     only), is one component of a     comprehensive MRSA colonization     surveillance program. It is not     intended to diagnose MRSA     infection nor to guide or     monitor treatment for     MRSA infections.  COMPREHENSIVE METABOLIC PANEL     Status: Abnormal   Collection Time    07/12/13  5:19 AM      Result Value Range   Sodium 133 (*) 135 - 145 mEq/L   Potassium 4.2  3.5 - 5.1 mEq/L   Chloride 103  96 - 112 mEq/L   CO2 20  19 - 32 mEq/L   Glucose, Bld 105 (*) 70 - 99 mg/dL   BUN 25 (*) 6 - 23 mg/dL   Creatinine, Ser 7.82 (*) 0.50 - 1.35 mg/dL   Calcium 7.7 (*) 8.4 - 10.5 mg/dL   Total Protein  5.6 (*) 6.0 - 8.3 g/dL   Albumin 1.9 (*) 3.5 - 5.2 g/dL   AST 27  0 - 37 U/L   ALT 39  0 - 53 U/L   Alkaline Phosphatase 150 (*) 39 - 117 U/L   Total Bilirubin 0.3  0.3 - 1.2 mg/dL   GFR calc non Af Amer 30 (*) >90 mL/min   GFR calc Af Amer 34 (*) >90 mL/min   Comment: (NOTE)     The eGFR has been calculated using the CKD EPI equation.     This calculation has not been validated in all clinical situations.     eGFR's persistently <90 mL/min signify possible Chronic Kidney     Disease.  CBC WITH DIFFERENTIAL     Status: Abnormal   Collection Time    07/12/13  5:19 AM      Result Value Range   WBC 15.6 (*) 4.0 - 10.5 K/uL   RBC 3.22 (*) 4.22 - 5.81 MIL/uL   Hemoglobin 9.6 (*) 13.0 - 17.0 g/dL   HCT 13.0  (*) 86.5 - 52.0 %   MCV 87.9  78.0 - 100.0 fL   MCH 29.8  26.0 - 34.0 pg   MCHC 33.9  30.0 - 36.0 g/dL   RDW 78.4  69.6 - 29.5 %   Platelets 287  150 - 400 K/uL   Comment: DELTA CHECK NOTED     REPEATED TO VERIFY   Neutrophils Relative % 79 (*) 43 - 77 %   Neutro Abs 12.4 (*) 1.7 - 7.7 K/uL   Lymphocytes Relative 11 (*) 12 - 46 %   Lymphs Abs 1.6  0.7 - 4.0 K/uL   Monocytes Relative 9  3 - 12 %   Monocytes Absolute 1.4 (*) 0.1 - 1.0 K/uL   Eosinophils Relative 1  0 - 5 %   Eosinophils Absolute 0.1  0.0 - 0.7 K/uL   Basophils Relative 0  0 - 1 %   Basophils Absolute 0.0  0.0 - 0.1 K/uL  LACTIC ACID, PLASMA     Status: None   Collection Time    07/12/13  5:19 AM      Result Value Range   Lactic Acid, Venous 1.0  0.5 - 2.2 mmol/L   Mr Femur Left Wo Contrast  07/12/2013   CLINICAL DATA:  Fluid collection.  Left thigh swelling and redness.  EXAM: MR OF THE LEFT FEMUR WITHOUT CONTRAST  TECHNIQUE: Multiplanar, multisequence MR imaging was performed. No intravenous contrast was administered.  COMPARISON:  None.  FINDINGS: There is a large elongated fluid collection in the proximal left vastus lateralis. Edema radiates distally in the left vastus lateralis and there is subcutaneous edema diffusely around the left thigh, likely representing cellulitis. Fluid collection measures 7 cm AP x 5.6 cm transverse. Craniocaudal extent is 19 cm. Small foci of susceptibility artifact are present in the anterior portions, potentially representing metal or gas. Reactive edema is present in the rectus femoris muscle. The fluid collection lies deep to the iliotibial band and the origin of the fluid is at the distal extent of the tensor fascia lata muscle.  Posterior compartment appears normal. The femur appears normal without erosion or intra medullary edema.  Surgical clips are noted in the left inguinal region.  IMPRESSION: 1. Large fluid collection in the proximal lateral left thigh consistent with abscess. This  is in the proximal left vastus lateralis, with the proximal extent at the inferior margin of the tensor fascia lata  muscle. The collection is in both the subcutaneous tissues and vastus lateralis muscle belly. 2. Left thigh cellulitis.   Electronically Signed   By: Andreas Newport M.D.   On: 07/12/2013 11:38    Review of Systems  Constitutional: Negative.   HENT: Negative.   Eyes: Negative.   Respiratory: Negative.   Cardiovascular: Negative.   Gastrointestinal: Negative.   Musculoskeletal: Positive for back pain.  Neurological: Negative.   Endo/Heme/Allergies: Negative.   Psychiatric/Behavioral: The patient is not nervous/anxious.     Blood pressure 100/56, pulse 90, temperature 99.1 F (37.3 C), temperature source Oral, resp. rate 18, height 6\' 4"  (1.93 m), weight 124.6 kg (274 lb 11.1 oz), SpO2 99.00%. Physical Exam  Constitutional: He is oriented to person, place, and time. He appears well-developed and well-nourished.  HENT:  Head: Normocephalic and atraumatic.  Eyes: EOM are normal.  Cardiovascular: Normal rate and regular rhythm.   Respiratory: Effort normal.  Neurological: He is alert and oriented to person, place, and time.  Psychiatric: He has a normal mood and affect. His behavior is normal. Judgment and thought content normal.  Left thigh:  Anterior lateral position presents with well demarcated warm, erythematous, edematous, taught skin which is exquisitely TTP consistent with cellulitis.  Small non-draining ulcer present in area of cellulitis.  Left knee: No effusion, no TTP, no erythema, ROM 90 degrees flexion 0 degrees extension.   LLE: Mild edema throughout limb with no apparent knee or ankle joint involvement.  Normal sensation to light touch, DP pulses 2+ bilaterally.  Distal toes well perfused with cap refill <2sec.   ROM to left hip reduced due to pain in left thigh.  No TTP to left hip.  Assessment/Plan Pt reports to OR for incision and drainage of left thigh  abscess.  The risks and benefits of the alternative treatment options have been discussed in detail.  The patient wishes to proceed with surgery and specifically understands risks of bleeding, infection, nerve damage, blood clots, need for additional surgery, amputation and death.   FLOWERS, CHRISTOPHER S 07/12/2013, 2:36 PM   I've personally taken the patient's history and examined the patient.  I agree with Mr. Collier Salina note above.  The patient has an abscess of the left thigh and requires surgical drainage.  The risks and benefits of the alternative treatment options have been discussed in detail.  The patient wishes to proceed with surgery and specifically understands risks of bleeding, infection, nerve damage, blood clots, need for additional surgery, amputation and death.

## 2013-07-12 NOTE — H&P (Signed)
Internal Medicine Attending Admission Note Date: 07/12/2013  Patient name: [name] Medical record number: [id number] Date of birth: 1955-12-22 Age: 57 y.o. Gender: male  I saw and evaluated the patient. I reviewed the resident's note and I agree with the resident's findings and plan as documented in the resident's note, with the following additional comments.  Chief Complaint(s): Swelling, erythema, and pain of left thigh  History - key components related to admission: Patient is a 57 year old man with history of bilateral knee replacement, hypertension, chronic pain, BPH, and other problems as outlined in the medical history admitted with complaint of left thigh swelling especially on the lateral aspect of the left thigh, with associated erythema, warmth, and pain.  Patient was seen at [facility] ED, had I&D attempted, and was placed on oral antibiotics on 07/09/2013, his symptoms progressed despite oral therapy.   Physical Exam - key components related to admission:  Filed Vitals:   07/12/13 0400 07/12/13 0429 07/12/13 0500 07/12/13 0755  BP: 98/52  112/51 101/53  Pulse: 93  94 100  Temp:  98.5 F (36.9 C)  99.1 F (37.3 C)  TempSrc:  Oral  Oral  Resp: 19  21 18   Height:      Weight:      SpO2: 97%  96% 99%    General: Alert, no distress Lungs: Clear Heart: Regular; no extra sounds or murmurs Abdomen: Bowel sounds present, soft, nontender Extremities: There is warmth, erythema and induration of the left lateral thigh; there is a small wound from prior I&D with no purulent discharge.  There is swelling in the area of the left knee, with no overlying skin erythema but a slightly bruised appearance; there may be a small knee effusion, although this is not clear to my exam.  Lab results:   Basic Metabolic Panel:  Recent Labs  78/29/56 1149 07/12/13 0519  NA 132* 133*  K 4.2 4.2  CL 96 103  CO2 23 20  GLUCOSE 103* 105*  BUN 30* 25*  CREATININE 2.98* 2.34*   CALCIUM 8.7 7.7*    Liver Function Tests:  Recent Labs  07/12/13 0519  AST 27  ALT 39  ALKPHOS 150*  BILITOT 0.3  PROT 5.6*  ALBUMIN 1.9*     CBC:  Recent Labs  07/11/13 1149 07/12/13 0519  WBC 25.6* 15.6*  HGB 9.6* 9.6*  HCT 29.0* 28.3*  MCV 90.1 87.9  PLT 402* 287    Recent Labs  07/11/13 1149 07/12/13 0519  NEUTROABS 21.5* 12.4*  LYMPHSABS 2.0 1.6  MONOABS 1.8* 1.4*  EOSABS 0.3 0.1  BASOSABS 0.0 0.0      Imaging results:  Mr Femur Left Wo Contrast  07/12/2013   CLINICAL DATA:  Fluid collection.  Left thigh swelling and redness.  EXAM: MR OF THE LEFT FEMUR WITHOUT CONTRAST  TECHNIQUE: Multiplanar, multisequence MR imaging was performed. No intravenous contrast was administered.  COMPARISON:  None.  FINDINGS: There is a large elongated fluid collection in the proximal left vastus lateralis. Edema radiates distally in the left vastus lateralis and there is subcutaneous edema diffusely around the left thigh, likely representing cellulitis. Fluid collection measures 7 cm AP x 5.6 cm transverse. Craniocaudal extent is 19 cm. Small foci of susceptibility artifact are present in the anterior portions, potentially representing metal or gas. Reactive edema is present in the rectus femoris muscle. The fluid collection lies deep to the iliotibial band and the origin of the fluid is at the distal extent of  the tensor fascia lata muscle.  Posterior compartment appears normal. The femur appears normal without erosion or intra medullary edema.  Surgical clips are noted in the left inguinal region.  IMPRESSION: 1. Large fluid collection in the proximal lateral left thigh consistent with abscess. This is in the proximal left vastus lateralis, with the proximal extent at the inferior margin of the tensor fascia lata muscle. The collection is in both the subcutaneous tissues and vastus lateralis muscle belly. 2. Left thigh cellulitis.   Electronically Signed   By: Andreas Newport M.D.    On: 07/12/2013 11:38    Other results: EKG: Sinus rhythm  Assessment & Plan by Problem:  1.  Left thigh cellulitis with MRI finding today of a large fluid collection in the proximal lateral left thigh consistent with abscess.  Plan is continue empiric broad-spectrum IV antibiotics with vancomycin and Zosyn; we have asked orthopedics to see patient regarding our concern about possible infectious involvement of the left knee; would inform orthopedics of the MRI finding of abscess, and make patient n.p.o. in anticipation of surgical drainage.   2.  Acute renal failure.  We do not have patient's prior labs available, but by his report he has no history of renal failure or renal insufficiency.  His acute renal failure may be due to acute infection and sepsis.  His creatinine is somewhat better today following IV volume replacement.  He reports that he is making urine and has no voiding difficulty.  Plan is continue IV volume replacement; follow renal function and electrolytes; renal ultrasound to rule out hydronephrosis; check urinalysis.  3.  Chronic pain.  Patient is on chronic opioid therapy prescribed by his outpatient physician.  Would discuss with pharmacy whether dosing adjustment is needed in the setting of renal insufficiency, and resume home regimen when taking P.O..  4.  Other problems and plans as per the resident physician's note.

## 2013-07-12 NOTE — Transfer of Care (Signed)
Immediate Anesthesia Transfer of Care Note  Patient: [name]  Procedure(s) Performed: Procedure(s): I&D Left Thigh Abscess (Left)  Patient Location: PACU  Anesthesia Type:General  Level of Consciousness: awake, alert  and oriented  Airway & Oxygen Therapy: Patient Spontanous Breathing  Post-op Assessment: Report given to PACU RN and Post -op Vital signs reviewed and stable  Post vital signs: Reviewed and stable  Complications: No apparent anesthesia complications

## 2013-07-13 ENCOUNTER — Encounter (HOSPITAL_COMMUNITY): Payer: Self-pay | Admitting: Orthopedic Surgery

## 2013-07-13 ENCOUNTER — Inpatient Hospital Stay (HOSPITAL_COMMUNITY): Payer: Managed Care, Other (non HMO)

## 2013-07-13 DIAGNOSIS — A419 Sepsis, unspecified organism: Secondary | ICD-10-CM | POA: Diagnosis present

## 2013-07-13 LAB — URINALYSIS, ROUTINE W REFLEX MICROSCOPIC
Glucose, UA: NEGATIVE mg/dL
Ketones, ur: NEGATIVE mg/dL
Leukocytes, UA: NEGATIVE
Protein, ur: NEGATIVE mg/dL
Urobilinogen, UA: 0.2 mg/dL (ref 0.0–1.0)

## 2013-07-13 LAB — CBC
HCT: 29.2 % — ABNORMAL LOW (ref 39.0–52.0)
Hemoglobin: 9.4 g/dL — ABNORMAL LOW (ref 13.0–17.0)
Platelets: 338 10*3/uL (ref 150–400)
RDW: 15 % (ref 11.5–15.5)
WBC: 13 10*3/uL — ABNORMAL HIGH (ref 4.0–10.5)

## 2013-07-13 LAB — BASIC METABOLIC PANEL
Chloride: 106 mEq/L (ref 96–112)
Creatinine, Ser: 1.74 mg/dL — ABNORMAL HIGH (ref 0.50–1.35)
GFR calc Af Amer: 49 mL/min — ABNORMAL LOW (ref >90)
Potassium: 4.5 mEq/L (ref 3.5–5.1)

## 2013-07-13 MED ORDER — TRIFLURIDINE 1 % OP SOLN
1.0000 [drp] | Freq: Four times a day (QID) | OPHTHALMIC | Status: DC
  Administered 2013-07-13 – 2013-07-17 (×17): 1 [drp] via OPHTHALMIC
  Filled 2013-07-13 (×2): qty 7.5

## 2013-07-13 MED ORDER — VANCOMYCIN HCL 10 G IV SOLR
1250.0000 mg | Freq: Two times a day (BID) | INTRAVENOUS | Status: DC
  Administered 2013-07-13 – 2013-07-14 (×4): 1250 mg via INTRAVENOUS
  Filled 2013-07-13 (×7): qty 1250

## 2013-07-13 MED ORDER — MORPHINE SULFATE ER 30 MG PO TBCR
75.0000 mg | EXTENDED_RELEASE_TABLET | Freq: Two times a day (BID) | ORAL | Status: DC
  Administered 2013-07-13 – 2013-07-14 (×3): 75 mg via ORAL
  Filled 2013-07-13: qty 1
  Filled 2013-07-13: qty 2
  Filled 2013-07-13 (×2): qty 1
  Filled 2013-07-13 (×2): qty 2

## 2013-07-13 MED ORDER — FLUORESCEIN SODIUM 1 MG OP STRP
2.0000 | ORAL_STRIP | Freq: Once | OPHTHALMIC | Status: DC
  Filled 2013-07-13 (×2): qty 2

## 2013-07-13 MED ORDER — OXYCODONE HCL 5 MG PO TABS
30.0000 mg | ORAL_TABLET | Freq: Four times a day (QID) | ORAL | Status: DC
  Administered 2013-07-13 – 2013-07-17 (×18): 30 mg via ORAL
  Filled 2013-07-13 (×18): qty 6

## 2013-07-13 MED ORDER — GANCICLOVIR 0.15 % OP GEL
1.0000 "application " | Freq: Two times a day (BID) | OPHTHALMIC | Status: DC
  Administered 2013-07-13 – 2013-07-17 (×8): 1 [drp] via OPHTHALMIC
  Filled 2013-07-13 (×2): qty 5

## 2013-07-13 NOTE — Op Note (Signed)
NAMEAARAN, ENBERG NO.:  [id number]  MEDICAL RECORD NO.:  [id number]  LOCATION:  2C06C                        FACILITY:  [facility]  PHYSICIAN:  [name], MD        DATE OF BIRTH:  1956-01-08  DATE OF PROCEDURE:  07/12/2013 DATE OF DISCHARGE:                              OPERATIVE REPORT   POSTOPERATIVE DIAGNOSIS:  Left thigh deep abscess.  POSTOPERATIVE DIAGNOSIS:  Left thigh deep abscess.  PROCEDURE:  Irrigation and excisional debridement of the left thigh deep abscess including skin, subcutaneous tissue, and muscle.  SURGEON:  [name], MD.  ANESTHESIA:  General.  ESTIMATED BLOOD LOSS:  300 mL.  TOURNIQUET TIME:  Zero.  COMPLICATIONS:  None apparent.  DISPOSITION:  Extubated, awake, and stable to recovery.  SPECIMENS:  Deep tissue to microbiology and pathology.  INDICATIONS FOR PROCEDURE:  The patient is a 57 year old male with past medical history significant for chronic pain.  He noticed an infected hair follicle at his left thigh about a week ago.  He says that it got quite a bit larger over night about 4 days ago.  He went to urgent care, and had this area lanced.  He went home but then yesterday noted significant swelling and pain in the left thigh.  He presented to the emergency room and was admitted to the hospital.  His white blood cell count at that point was 25,000.  He is on IV antibiotics overnight and his white blood cell count is dropped to 15,000.  He had an MRI which showed a large abscess in the left thigh.  He presents now for irrigation and excisional debridement of this lesion.  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  He does not have any pain of significance referable to his knee.  Given the adjacent infection.  Decision was made to avoid arthrocentesis at this point.  He presents now for I and D of the left thigh.  He understands the risks and benefits, the alternative treatment options and  elects surgical treatment.  Specifically understands risks of bleeding, infection, nerve damage, blood clots, need for additional surgery, recurrence of the abscess, amputation, and death.  PROCEDURE IN DETAIL:  After preoperative consent was obtained, the correct operative site was identified.  The patient was brought to the operating room and placed supine on the operating table.  General anesthesia was induced.  Preoperative antibiotics were administered. Surgical time-out was taken.  Left lower extremity was prepped and draped in standard sterile fashion.  The patient's previous incision was identified.  A hemostat was inserted into this incision and immediately pus poured forth from the wound.  The incision was extended distally over the area of swelling.  Copious foul-smelling purulent liquid came out of the wound.  This was approximately 500 mL on the initial incision.  The area of the abscess was then manually explored. Septations were broken up within this loculated fluid collection.  More purulent fluid drained out of the wound.  Excisional debridement was then performed with a scalpel, scissors, and a rongeur circumferentially from the level of the skin down through the  subcutaneous tissue to the level of the muscles of the anterolateral thigh.  All necrotic and nonviable material was removed sharply.  Wound was then irrigated with 6 L of normal saline.  At this point, there was no evident purulent material remaining in the wound.  A 2nd thorough debridement was then performed sharply with knife, scissors, and rongeur.  All nonviable material was again removed.  The wound was then again irrigated with 3 L of normal saline.  At this point, two 15-French JP drains were placed into the wound, one in the deep tissues anteriorly and one in the deep tissues posteriorly.  The drains were sewn in with 2-0 nylon.  The wound was then loosely approximated with retention sutures in simple  sutures of 2-0 nylon.  Sterile dressings were applied followed by a compression wrap.  The patient was then awakened from anesthesia and transported to the recovery room in stable condition.  FOLLOWUP PLAN:  The patient will be weightbearing as tolerated on the left lower extremity.  He will continue on IV vancomycin and Zosyn pending his culture results.  Depending on how he does clinically he may need repeat I and D of the left thigh in a few days. I spoke with the medicine teaching service resident on cross cover call at the end of the case.  I asked him to check on the patient later this evening since the patient was febrile and slightly hypotensive in the operating room.  We will plan to send him back to step-down, where he was prior to surgery.  The resident understood this plan and agreed.     [name], MD     JH/MEDQ  D:  07/12/2013  T:  07/13/2013  Job:  [id number]

## 2013-07-13 NOTE — Progress Notes (Signed)
ANTIBIOTIC CONSULT NOTE - INITIAL  Pharmacy Consult for vanc/zosyn Indication: Cellulitis/abscess  No Known Allergies  Patient Measurements: Height: 6\' 4"  (193 cm) Weight: 277 lb 12.5 oz (126 kg) IBW/kg (Calculated) : 86.8  Vital Signs: Temp: 98.5 F (36.9 C) (12/18 0441) Temp src: Oral (12/18 0441) BP: 109/55 mmHg (12/18 0600) Pulse Rate: 95 (12/18 0600) Intake/Output from previous day: 12/17 0701 - 12/18 0700 In: 3305.8 [I.V.:2755.8; IV Piggyback:550] Out: 2165 [Urine:2025; Drains:90; Blood:50] Intake/Output from this shift:    Labs:  Recent Labs  07/11/13 1149 07/12/13 0519 07/13/13 0410  WBC 25.6* 15.6* 13.0*  HGB 9.6* 9.6* 9.4*  PLT 402* 287 338  CREATININE 2.98* 2.34* 1.74*   Estimated Creatinine Clearance: 69.5 ml/min (by C-G formula based on Cr of 1.74). No results found for this basename: VANCOTROUGH, Leodis Binet, VANCORANDOM, GENTTROUGH, GENTPEAK, GENTRANDOM, TOBRATROUGH, TOBRAPEAK, TOBRARND, AMIKACINPEAK, AMIKACINTROU, AMIKACIN,  in the last 72 hours   Microbiology: Recent Results (from the past 720 hour(s))  CULTURE, BLOOD (ROUTINE X 2)     Status: None   Collection Time    07/11/13  3:30 PM      Result Value Range Status   Specimen Description BLOOD ARM RIGHT   Final   Special Requests BOTTLES DRAWN AEROBIC ONLY 2.5CC   Final   Culture  Setup Time     Final   Value: 07/11/2013 20:22     Performed at [facility]   Culture     Final   Value:        BLOOD CULTURE RECEIVED NO GROWTH TO DATE CULTURE WILL BE HELD FOR 5 DAYS BEFORE ISSUING A FINAL NEGATIVE REPORT     Performed at [facility]   Report Status PENDING   Incomplete  MRSA PCR SCREENING     Status: None   Collection Time    07/11/13  6:24 PM      Result Value Range Status   MRSA by PCR NEGATIVE  NEGATIVE Final   Comment:            The GeneXpert MRSA Assay (FDA     approved for NASAL specimens     only), is one component of a     comprehensive MRSA colonization   surveillance program. It is not     intended to diagnose MRSA     infection nor to guide or     monitor treatment for     MRSA infections.  CULTURE, BLOOD (ROUTINE X 2)     Status: None   Collection Time    07/11/13  7:15 PM      Result Value Range Status   Specimen Description BLOOD RIGHT HAND   Final   Special Requests BOTTLES DRAWN AEROBIC ONLY 4CC   Final   Culture  Setup Time     Final   Value: 07/12/2013 01:04     Performed at [facility]   Culture     Final   Value:        BLOOD CULTURE RECEIVED NO GROWTH TO DATE CULTURE WILL BE HELD FOR 5 DAYS BEFORE ISSUING A FINAL NEGATIVE REPORT     Performed at [facility]   Report Status PENDING   Incomplete  ANAEROBIC CULTURE     Status: None   Collection Time    07/12/13  5:12 PM      Result Value Range Status   Specimen Description TISSUE LEFT THIGH   Final   Special Requests NONE  Final   Gram Stain     Final   Value: RARE WBC PRESENT,BOTH PMN AND MONONUCLEAR     NO ORGANISMS SEEN     Performed at [facility]   Culture PENDING   Incomplete   Report Status PENDING   Incomplete  TISSUE CULTURE     Status: None   Collection Time    07/12/13  5:12 PM      Result Value Range Status   Specimen Description TISSUE LEFT THIGH   Final   Special Requests NONE   Final   Gram Stain     Final   Value: RARE WBC PRESENT,BOTH PMN AND MONONUCLEAR     NO ORGANISMS SEEN     Performed at [facility]   Culture     Final   Value: NO GROWTH     Performed at [facility]   Report Status PENDING   Incomplete    Medical History: Past Medical History  Diagnosis Date  . Hypertension     Medications:  Prescriptions prior to admission  Medication Sig Dispense Refill  . atenolol (TENORMIN) 100 MG tablet Take 100 mg by mouth daily.      Marland Kitchen atorvastatin (LIPITOR) 80 MG tablet Take 80 mg by mouth daily.      . cephALEXin (KEFLEX) 500 MG capsule Take 500 mg by mouth 4 (four) times daily.      .  clonazePAM (KLONOPIN) 2 MG tablet Take 2 mg by mouth 4 (four) times daily.      . Diclofenac Sodium (PENNSAID) 1.5 % SOLN Place 8 drops onto the skin 2 (two) times daily. *4 drops onto both knees*      . diphenhydrAMINE (BENADRYL) 25 mg capsule Take 25 mg by mouth daily as needed for allergies.      Marland Kitchen oxycodone (ROXICODONE) 30 MG immediate release tablet Take 30 mg by mouth 4 (four) times daily.       Marland Kitchen oxymorphone (OPANA ER) 30 MG 12 hr tablet Take 30 mg by mouth every 12 (twelve) hours.      Marland Kitchen sulfamethoxazole-trimethoprim (BACTRIM DS) 800-160 MG per tablet Take 1 tablet by mouth 2 (two) times daily.       Scheduled:  . atorvastatin  80 mg Oral Daily  . clonazePAM  2 mg Oral QID  . docusate sodium  100 mg Oral BID  . morphine  75 mg Oral Q12H  . oxyCODONE  30 mg Oral Q6H  . piperacillin-tazobactam (ZOSYN)  IV  3.375 g Intravenous Q8H  . senna  2 tablet Oral BID  . vancomycin  1,750 mg Intravenous Q24H   Assessment:  57 yo who was seen in the ED for thigh swelling, and sepsis. Patient completed I&D yesterday. Patient is currently afebrile, wbc trending down, and renal function has improved. Will continue Vanc and zosyn  Goal of Therapy:  Vancomycin trough level 10-15 mcg/ml  Plan:  -Renally adjust to Vanc 1.25g IV q12h   -F/u with trough at steady state, I imagine patients renal function will continue to improve.  -Continue Zosyn 3.375g IV q8

## 2013-07-13 NOTE — Progress Notes (Signed)
Internal Medicine Attending  Date: 07/13/2013  Patient name: [name] Medical record number: [id number] Date of birth: [birth year] Age: 57 y.o. Gender: male  I saw and evaluated the patient and discussed his care on a.m. rounds with house staff; please see the note by resident Dr. Mariea Clonts for details of clinical findings and plans.  Patient underwent incision and drainage of left thigh abscess by orthopedic surgery last night, and is doing well postoperatively.  His drains and bandaging are in place.  He reports improvement in his pain this morning after we resumed an oral opioid regimen approximating his home regimen.  Patient's renal function is improving, with a creatinine today of 1.74; WBC has improved to 13.0.  We discussed DVT prophylaxis with orthopedic surgery, and they advised not starting prophylactic subcutaneous heparin or Lovenox today because of concerns about bleeding into the wound; they will advise when they feel that it is safe to start prophylaxis.  Plan for now is continue empiric broad-spectrum IV antibiotics; await culture results; wound management and postoperative care as per orthopedic surgery; follow creatinine and urine output.

## 2013-07-13 NOTE — Progress Notes (Signed)
Subjective: 1 Day Post-Op Procedure(s) (LRB): I&D Left Thigh Abscess (Left) Pt doing well this AM, pain well tolerated.  Pt able to walk to bathroom without too much discomfort.  Pt denies N/V/F/C, chest pain, SOB, calf pain, or paresthesia bilaterally.  Objective: Vital signs in last 24 hours: Temp:  [97.9 F (36.6 C)-99.1 F (37.3 C)] 98.5 F (36.9 C) (12/18 0441) Pulse Rate:  [67-95] 95 (12/18 0600) Resp:  [10-20] 20 (12/18 0600) BP: (92-134)/(55-69) 109/55 mmHg (12/18 0600) SpO2:  [94 %-100 %] 97 % (12/18 0600) Weight:  [126 kg (277 lb 12.5 oz)] 126 kg (277 lb 12.5 oz) (12/18 0441)  Intake/Output from previous day: 12/17 0701 - 12/18 0700 In: 3305.8 [I.V.:2755.8; IV Piggyback:550] Out: 2165 [Urine:2025; Drains:90; Blood:50] Intake/Output this shift:     Recent Labs  07/11/13 1149 07/12/13 0519 07/13/13 0410  HGB 9.6* 9.6* 9.4*    Recent Labs  07/12/13 0519 07/13/13 0410  WBC 15.6* 13.0*  RBC 3.22* 3.20*  HCT 28.3* 29.2*  PLT 287 338    Recent Labs  07/12/13 0519 07/13/13 0410  NA 133* 137  K 4.2 4.5  CL 103 106  CO2 20 22  BUN 25* 14  CREATININE 2.34* 1.74*  GLUCOSE 105* 103*  CALCIUM 7.7* 7.7*   No results found for this basename: LABPT, INR,  in the last 72 hours  WD WN 57 y/o male in NAD, A/Ox3, appears stated age.  EOMI, mood and affect normal, respirations unlabored.  Pt presents in bed with leg length dressing applied and in good repair.  Drains present and functional.  Good ROM to ankle and distal toes, with cap refill < 2sec.  Normal sensation to light touch is intact.  +TTP to dressing at incision site.  Negative Homan's sign, no calf TTP  Assessment/Plan: 1 Day Post-Op Procedure(s) (LRB): I&D Left Thigh Abscess (Left) I had detailed discussion with pt regarding continued care of drains and incision.  Pt will maintain full weight bearing status with 1 assist.  Continue medical management of antibiotic therapy.    FLOWERS, CHRISTOPHER  S 07/13/2013, 9:53 AM

## 2013-07-13 NOTE — Consult Note (Signed)
Reason for Consult: R/O viral keratitis od  Referring Physician: [name] is an 57 y.o. male.  HPI: c  Leg abscess s/p I/D : Evaluation requested  To R/O HSV recurrence in OD .  Past Medical History  Diagnosis Date  . Hypertension     Past Surgical History  Procedure Laterality Date  . I&d extremity Left 07/12/2013    Procedure: I&D Left Thigh Abscess;  Surgeon: [name], MD;  Location: [facility] OR;  Service: Orthopedics;  Laterality: Left;    History reviewed. No pertinent family history.  Social History:  reports that he has never smoked. He does not have any smokeless tobacco history on file. His alcohol and drug histories are not on file.  Allergies: No Known Allergies  Medications: I have reviewed the patient's current medications.  Results for orders placed during the hospital encounter of 07/11/13 (from the past 48 hour(s))  CULTURE, BLOOD (ROUTINE X 2)     Status: None   Collection Time    07/11/13  7:15 PM      Result Value Range   Specimen Description BLOOD RIGHT HAND     Special Requests BOTTLES DRAWN AEROBIC ONLY 4CC     Culture  Setup Time       Value: 07/12/2013 01:04     Performed at [facility]   Culture       Value:        BLOOD CULTURE RECEIVED NO GROWTH TO DATE CULTURE WILL BE HELD FOR 5 DAYS BEFORE ISSUING A FINAL NEGATIVE REPORT     Performed at [facility]   Report Status PENDING    COMPREHENSIVE METABOLIC PANEL     Status: Abnormal   Collection Time    07/12/13  5:19 AM      Result Value Range   Sodium 133 (*) 135 - 145 mEq/L   Potassium 4.2  3.5 - 5.1 mEq/L   Chloride 103  96 - 112 mEq/L   CO2 20  19 - 32 mEq/L   Glucose, Bld 105 (*) 70 - 99 mg/dL   BUN 25 (*) 6 - 23 mg/dL   Creatinine, Ser 4.09 (*) 0.50 - 1.35 mg/dL   Calcium 7.7 (*) 8.4 - 10.5 mg/dL   Total Protein 5.6 (*) 6.0 - 8.3 g/dL   Albumin 1.9 (*) 3.5 - 5.2 g/dL   AST 27  0 - 37 U/L   ALT 39  0 - 53 U/L   Alkaline Phosphatase 150 (*) 39 - 117 U/L   Total Bilirubin 0.3  0.3 - 1.2 mg/dL   GFR calc non Af Amer 30 (*) >90 mL/min   GFR calc Af Amer 34 (*) >90 mL/min   Comment: (NOTE)     The eGFR has been calculated using the CKD EPI equation.     This calculation has not been validated in all clinical situations.     eGFR's persistently <90 mL/min signify possible Chronic Kidney     Disease.  CBC WITH DIFFERENTIAL     Status: Abnormal   Collection Time    07/12/13  5:19 AM      Result Value Range   WBC 15.6 (*) 4.0 - 10.5 K/uL   RBC 3.22 (*) 4.22 - 5.81 MIL/uL   Hemoglobin 9.6 (*) 13.0 - 17.0 g/dL   HCT 81.1 (*) 91.4 - 78.2 %   MCV 87.9  78.0 - 100.0 fL   MCH 29.8  26.0 - 34.0 pg  MCHC 33.9  30.0 - 36.0 g/dL   RDW 40.9  81.1 - 91.4 %   Platelets 287  150 - 400 K/uL   Comment: DELTA CHECK NOTED     REPEATED TO VERIFY   Neutrophils Relative % 79 (*) 43 - 77 %   Neutro Abs 12.4 (*) 1.7 - 7.7 K/uL   Lymphocytes Relative 11 (*) 12 - 46 %   Lymphs Abs 1.6  0.7 - 4.0 K/uL   Monocytes Relative 9  3 - 12 %   Monocytes Absolute 1.4 (*) 0.1 - 1.0 K/uL   Eosinophils Relative 1  0 - 5 %   Eosinophils Absolute 0.1  0.0 - 0.7 K/uL   Basophils Relative 0  0 - 1 %   Basophils Absolute 0.0  0.0 - 0.1 K/uL  LACTIC ACID, PLASMA     Status: None   Collection Time    07/12/13  5:19 AM      Result Value Range   Lactic Acid, Venous 1.0  0.5 - 2.2 mmol/L  C-REACTIVE PROTEIN     Status: Abnormal   Collection Time    07/12/13  2:36 PM      Result Value Range   CRP 18.6 (*) <0.60 mg/dL   Comment: Performed at [facility]  SEDIMENTATION RATE     Status: Abnormal   Collection Time    07/12/13  2:36 PM      Result Value Range   Sed Rate 83 (*) 0 - 16 mm/hr  ANAEROBIC CULTURE     Status: None   Collection Time    07/12/13  5:12 PM      Result Value Range   Specimen Description TISSUE LEFT THIGH     Special Requests NONE     Gram Stain       Value: RARE WBC PRESENT,BOTH PMN AND MONONUCLEAR     NO ORGANISMS SEEN     Performed at  [facility]   Culture       Value: NO ANAEROBES ISOLATED; CULTURE IN PROGRESS FOR 5 DAYS     Performed at [facility]   Report Status PENDING    TISSUE CULTURE     Status: None   Collection Time    07/12/13  5:12 PM      Result Value Range   Specimen Description TISSUE LEFT THIGH     Special Requests NONE     Gram Stain       Value: RARE WBC PRESENT,BOTH PMN AND MONONUCLEAR     NO ORGANISMS SEEN     Performed at [facility]   Culture       Value: NO GROWTH     Performed at [facility]   Report Status PENDING    BASIC METABOLIC PANEL     Status: Abnormal   Collection Time    07/13/13  4:10 AM      Result Value Range   Sodium 137  135 - 145 mEq/L   Potassium 4.5  3.5 - 5.1 mEq/L   Chloride 106  96 - 112 mEq/L   CO2 22  19 - 32 mEq/L   Glucose, Bld 103 (*) 70 - 99 mg/dL   BUN 14  6 - 23 mg/dL   Comment: DELTA CHECK NOTED   Creatinine, Ser 1.74 (*) 0.50 - 1.35 mg/dL   Calcium 7.7 (*) 8.4 - 10.5 mg/dL   GFR calc non Af Amer 42 (*) >90 mL/min  GFR calc Af Amer 49 (*) >90 mL/min   Comment: (NOTE)     The eGFR has been calculated using the CKD EPI equation.     This calculation has not been validated in all clinical situations.     eGFR's persistently <90 mL/min signify possible Chronic Kidney     Disease.  CBC     Status: Abnormal   Collection Time    07/13/13  4:10 AM      Result Value Range   WBC 13.0 (*) 4.0 - 10.5 K/uL   RBC 3.20 (*) 4.22 - 5.81 MIL/uL   Hemoglobin 9.4 (*) 13.0 - 17.0 g/dL   HCT 16.1 (*) 09.6 - 04.5 %   MCV 91.3  78.0 - 100.0 fL   MCH 29.4  26.0 - 34.0 pg   MCHC 32.2  30.0 - 36.0 g/dL   RDW 40.9  81.1 - 91.4 %   Platelets 338  150 - 400 K/uL  URINALYSIS, ROUTINE W REFLEX MICROSCOPIC     Status: None   Collection Time    07/13/13  5:06 PM      Result Value Range   Color, Urine YELLOW  YELLOW   APPearance CLEAR  CLEAR   Specific Gravity, Urine 1.011  1.005 - 1.030   pH 5.5  5.0 - 8.0   Glucose, UA NEGATIVE   NEGATIVE mg/dL   Hgb urine dipstick NEGATIVE  NEGATIVE   Bilirubin Urine NEGATIVE  NEGATIVE   Ketones, ur NEGATIVE  NEGATIVE mg/dL   Protein, ur NEGATIVE  NEGATIVE mg/dL   Urobilinogen, UA 0.2  0.0 - 1.0 mg/dL   Nitrite NEGATIVE  NEGATIVE   Leukocytes, UA NEGATIVE  NEGATIVE   Comment: MICROSCOPIC NOT DONE ON URINES WITH NEGATIVE PROTEIN, BLOOD, LEUKOCYTES, NITRITE, OR GLUCOSE <1000 mg/dL.    Dg Knee 1-2 Views Left  07/13/2013   CLINICAL DATA:  Abscess, postop  EXAM: LEFT KNEE - 1-2 VIEW  COMPARISON:  None  FINDINGS: Bones appear demineralized.  Components of left knee prosthesis identified in expected positions.  Mild soft tissue swelling at the site.  No acute fracture, dislocation or bone destruction.  No periprosthetic lucency or knee joint effusion seen.  IMPRESSION: Osseous demineralization and left knee prosthesis.  No acute abnormalities.   Electronically Signed   By: Ulyses Southward M.D.   On: 07/13/2013 02:02   US Renal  07/13/2013   CLINICAL DATA:  History BPH.  Question obstructive uropathy.  EXAM: RENAL/URINARY TRACT ULTRASOUND COMPLETE  COMPARISON:  None.  FINDINGS: Right Kidney:  Length: 13 cm. Echogenicity within normal limits. No mass or hydronephrosis visualized.  Left Kidney:  Length: 13.5 cm. Echogenicity within normal limits. No mass or hydronephrosis visualized.  Bladder:  Prevoid urinary bladder 276 cc. No evidence of bladder wall abnormality.  IMPRESSION: No hydronephrosis or other abnormality.   Electronically Signed   By: Tiburcio Pea M.D.   On: 07/13/2013 06:01   Mr Femur Left Wo Contrast  07/12/2013   CLINICAL DATA:  Fluid collection.  Left thigh swelling and redness.  EXAM: MR OF THE LEFT FEMUR WITHOUT CONTRAST  TECHNIQUE: Multiplanar, multisequence MR imaging was performed. No intravenous contrast was administered.  COMPARISON:  None.  FINDINGS: There is a large elongated fluid collection in the proximal left vastus lateralis. Edema radiates distally in the left  vastus lateralis and there is subcutaneous edema diffusely around the left thigh, likely representing cellulitis. Fluid collection measures 7 cm AP x 5.6 cm transverse. Craniocaudal extent is 19  cm. Small foci of susceptibility artifact are present in the anterior portions, potentially representing metal or gas. Reactive edema is present in the rectus femoris muscle. The fluid collection lies deep to the iliotibial band and the origin of the fluid is at the distal extent of the tensor fascia lata muscle.  Posterior compartment appears normal. The femur appears normal without erosion or intra medullary edema.  Surgical clips are noted in the left inguinal region.  IMPRESSION: 1. Large fluid collection in the proximal lateral left thigh consistent with abscess. This is in the proximal left vastus lateralis, with the proximal extent at the inferior margin of the tensor fascia lata muscle. The collection is in both the subcutaneous tissues and vastus lateralis muscle belly. 2. Left thigh cellulitis.   Electronically Signed   By: Andreas Newport M.D.   On: 07/12/2013 11:38    Review of Systems  Constitutional: Positive for malaise/fatigue. Negative for fever and chills.  HENT: Negative for hearing loss.   Eyes: Positive for blurred vision, pain and redness. Negative for double vision, photophobia and discharge.  Cardiovascular: Negative.   Musculoskeletal: Positive for myalgias.  Neurological: Positive for dizziness and focal weakness. Negative for headaches.  Psychiatric/Behavioral: Positive for memory loss.   Blood pressure 113/62, pulse 83, temperature 98.1 F (36.7 C), temperature source Oral, resp. rate 18, height 6\' 4"  (1.93 m), weight 126 kg (277 lb 12.5 oz), SpO2 94.00%. Physical Exam  Constitutional: He appears well-developed and well-nourished.  HENT:  Head: Normocephalic and atraumatic.  Eyes: EOM are normal. Pupils are equal, round, and reactive to light. Right eye exhibits no discharge.  Left eye exhibits no discharge. No scleral icterus.    Neck: Normal range of motion.    Assessment/Plan: Viral keratoconjunctivitis od : R/O recurrent HSV keratitis od :  VA intact ou :  No posterior segment involvement . No retinitis no papillitis . Plan: Start : Viroptic od : 1gtt od QID . X 1wk.            Zirgan  od  : 1 application  od :Bid :  QHS and QAM. F/U @ [facility] post D/C.   [name] A 07/13/2013, 6:56 PM

## 2013-07-13 NOTE — Progress Notes (Addendum)
Subjective: Complaints of pain last night before pain meds were adjusted. 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 Cant exactly remember the name. Overall pt feels better.  Objective: Vital signs in last 24 hours: Filed Vitals:   07/13/13 0400 07/13/13 0441 07/13/13 0600 07/13/13 1100  BP: 103/57 103/57 109/55 119/69  Pulse: 95 95 95 91  Temp:  98.5 F (36.9 C)    TempSrc:  Oral  Oral  Resp: 18 19 20 11   Height:      Weight:  277 lb 12.5 oz (126 kg)    SpO2: 96% 100% 97% 96%   Weight change: 7 lb 1.3 oz (3.211 kg)  Intake/Output Summary (Last 24 hours) at 07/13/13 1137 Last data filed at 07/13/13 1056  Gross per 24 hour  Intake 3005.83 ml  Output   2065 ml  Net 940.83 ml   General appearance: Not in any distress, lying in bed, significant pain relief and impovemnt since his pain meds were adjusted. EYES- Right eye- Sclera clear, not erythematous or icteric, pupil reactive to light, no discharge. Left eye- Sclera clear, not erythematous or icteric, pupil reactive to light, no discharge. Lungs: Clear to ausculation bilat, no rales or crackles. Heart: regular rate and rhythm, S1, S2 normal, no murmur, click, rub or gallop Abdomen: soft, non-tender; bowel sounds normal; no masses,  no organomegaly Extremities: Left Lower extremity- bandage dressing form upper thigh to distal foot, sparing the toes, pt can wriggle toes without pain, and move knee, 2 drains on the lat side of thigh, draining sero-sanguinous fluid. RLE- Normal in appearance, reduced DP pulses distally, but legs are warm..  Lab Results: Basic Metabolic Panel:  Recent Labs Lab 07/12/13 0519 07/13/13 0410  NA 133* 137  K 4.2 4.5  CL 103 106  CO2 20 22  GLUCOSE 105* 103*  BUN 25* 14    CREATININE 2.34* 1.74*  CALCIUM 7.7* 7.7*   Liver Function Tests:  Recent Labs Lab 07/12/13 0519  AST 27  ALT 39  ALKPHOS 150*  BILITOT 0.3  PROT 5.6*  ALBUMIN 1.9*   CBC:  Recent Labs Lab 07/11/13 1149 07/12/13 0519 07/13/13 0410  WBC 25.6* 15.6* 13.0*  NEUTROABS 21.5* 12.4*  --   HGB 9.6* 9.6* 9.4*  HCT 29.0* 28.3* 29.2*  MCV 90.1 87.9 91.3  PLT 402* 287 338    Micro Results: Recent Results (from the past 240 hour(s))  CULTURE, BLOOD (ROUTINE X 2)     Status: None   Collection Time    07/11/13  3:30 PM      Result Value Range Status   Specimen Description BLOOD ARM RIGHT   Final   Special Requests BOTTLES DRAWN AEROBIC ONLY 2.5CC   Final   Culture  Setup Time     Final   Value: 07/11/2013 20:22     Performed at [facility]   Culture     Final   Value:        BLOOD CULTURE RECEIVED NO GROWTH TO DATE CULTURE WILL BE HELD FOR 5 DAYS BEFORE ISSUING A FINAL NEGATIVE REPORT     Performed at [facility]   Report Status PENDING   Incomplete  MRSA PCR SCREENING     Status: None  Collection Time    07/11/13  6:24 PM      Result Value Range Status   MRSA by PCR NEGATIVE  NEGATIVE Final   Comment:            The GeneXpert MRSA Assay (FDA     approved for NASAL specimens     only), is one component of a     comprehensive MRSA colonization     surveillance program. It is not     intended to diagnose MRSA     infection nor to guide or     monitor treatment for     MRSA infections.  CULTURE, BLOOD (ROUTINE X 2)     Status: None   Collection Time    07/11/13  7:15 PM      Result Value Range Status   Specimen Description BLOOD RIGHT HAND   Final   Special Requests BOTTLES DRAWN AEROBIC ONLY 4CC   Final   Culture  Setup Time     Final   Value: 07/12/2013 01:04     Performed at [facility]   Culture     Final   Value:        BLOOD CULTURE RECEIVED NO GROWTH TO DATE CULTURE WILL BE HELD FOR 5 DAYS BEFORE ISSUING A FINAL NEGATIVE REPORT      Performed at [facility]   Report Status PENDING   Incomplete  ANAEROBIC CULTURE     Status: None   Collection Time    07/12/13  5:12 PM      Result Value Range Status   Specimen Description TISSUE LEFT THIGH   Final   Special Requests NONE   Final   Gram Stain     Final   Value: RARE WBC PRESENT,BOTH PMN AND MONONUCLEAR     NO ORGANISMS SEEN     Performed at [facility]   Culture     Final   Value: NO ANAEROBES ISOLATED; CULTURE IN PROGRESS FOR 5 DAYS     Performed at [facility]   Report Status PENDING   Incomplete  TISSUE CULTURE     Status: None   Collection Time    07/12/13  5:12 PM      Result Value Range Status   Specimen Description TISSUE LEFT THIGH   Final   Special Requests NONE   Final   Gram Stain     Final   Value: RARE WBC PRESENT,BOTH PMN AND MONONUCLEAR     NO ORGANISMS SEEN     Performed at [facility]     Final   Value: NO GROWTH     Performed at [facility]   Report Status PENDING   Incomplete   Studies/Results: Dg Knee 1-2 Views Left  07/13/2013   CLINICAL DATA:  Abscess, postop  EXAM: LEFT KNEE - 1-2 VIEW  COMPARISON:  None  FINDINGS: Bones appear demineralized.  Components of left knee prosthesis identified in expected positions.  Mild soft tissue swelling at the site.  No acute fracture, dislocation or bone destruction.  No periprosthetic lucency or knee joint effusion seen.  IMPRESSION: Osseous demineralization and left knee prosthesis.  No acute abnormalities.   Electronically Signed   By: Ulyses Southward M.D.   On: 07/13/2013 02:02   US Renal  07/13/2013   CLINICAL DATA:  History BPH.  Question obstructive uropathy.  EXAM: RENAL/URINARY TRACT ULTRASOUND COMPLETE  COMPARISON:  None.  FINDINGS: Right  Kidney:  Length: 13 cm. Echogenicity within normal limits. No mass or hydronephrosis visualized.  Left Kidney:  Length: 13.5 cm. Echogenicity within normal limits. No mass or hydronephrosis visualized.   Bladder:  Prevoid urinary bladder 276 cc. No evidence of bladder wall abnormality.  IMPRESSION: No hydronephrosis or other abnormality.   Electronically Signed   By: Tiburcio Pea M.D.   On: 07/13/2013 06:01   Mr Femur Left Wo Contrast  07/12/2013   CLINICAL DATA:  Fluid collection.  Left thigh swelling and redness.  EXAM: MR OF THE LEFT FEMUR WITHOUT CONTRAST  TECHNIQUE: Multiplanar, multisequence MR imaging was performed. No intravenous contrast was administered.  COMPARISON:  None.  FINDINGS: There is a large elongated fluid collection in the proximal left vastus lateralis. Edema radiates distally in the left vastus lateralis and there is subcutaneous edema diffusely around the left thigh, likely representing cellulitis. Fluid collection measures 7 cm AP x 5.6 cm transverse. Craniocaudal extent is 19 cm. Small foci of susceptibility artifact are present in the anterior portions, potentially representing metal or gas. Reactive edema is present in the rectus femoris muscle. The fluid collection lies deep to the iliotibial band and the origin of the fluid is at the distal extent of the tensor fascia lata muscle.  Posterior compartment appears normal. The femur appears normal without erosion or intra medullary edema.  Surgical clips are noted in the left inguinal region.  IMPRESSION: 1. Large fluid collection in the proximal lateral left thigh consistent with abscess. This is in the proximal left vastus lateralis, with the proximal extent at the inferior margin of the tensor fascia lata muscle. The collection is in both the subcutaneous tissues and vastus lateralis muscle belly. 2. Left thigh cellulitis.   Electronically Signed   By: Andreas Newport M.D.   On: 07/12/2013 11:38   Medications: I have reviewed the patient's current medications. Scheduled Meds: . atorvastatin  80 mg Oral Daily  . clonazePAM  2 mg Oral QID  . docusate sodium  100 mg Oral BID  . morphine  75 mg Oral Q12H  . oxyCODONE  30 mg  Oral Q6H  . piperacillin-tazobactam (ZOSYN)  IV  3.375 g Intravenous Q8H  . senna  2 tablet Oral BID  . vancomycin  1,250 mg Intravenous Q12H   Continuous Infusions: . sodium chloride 1,000 mL (07/13/13 1126)   PRN Meds:.ondansetron (ZOFRAN) IV, ondansetron Assessment/Plan: Principal Problem:   Abscess of left thigh Active Problems:   Acute renal failure   Chronic pain syndrome   Testosterone deficiency   Chronic use of opiate drugs therapeutic purposes   Septic shock due to abscess  Cellulitis and Abscess with Sepsis- Resolving. Had an MRI of thigh- Large fluid collection in the proximal lateral left thigh consistent with abscess, and subsequently Had drainage of the Abcess yesterday, by Orthopedics, drainage of 1L of fluid. WBC trending down- 13 today < 15.6 < 25.6.    - Blood cultures- No growth to date, but was on antibiotics-as an out-pt prior to samples been drawn.  - Continue Vancomycin- Per pharm- 2nd day - Continue Zosyn- Per pharm - 2nd day. - Odansetron 4mg  daily. - commence regular diet.  - Ortho consulted, appreciate recs.  - Pain meds- MS contin 75mg  BID, oxycodone- 30mg  PO Q6H.  ARF- No records to compare and get baseline. But pt reports he has never been told he has kidney problems. Cr- 2.34 today >> 2.98, BUN- 25 today >> 30. Some improvement. Likley due to sepsis.  BUN/Cr ratio- 10. Pt has a hx of( self reported ) BPH, consider CRF, due to obstructive uropathy. As per pt PCP, pt baseline is normal.  -Cont IVF n/s 1108mls/hr - Renal USS- No hydronephrosis. - Urinalysis- Pending.  HLD- No lipid profile on chart.  - Home meds, will continue on admission. Atorvastatin-80mg  daily.  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 Last episode was years ago. Presently, eye appears normal. - Ophthalmology consulted today.   Chronic Knee pain- Status post bilat knee replacement surgery, on Opena- 30mg   BID, Oxycodone- 30mg  Q6h daily for the past 8 years..  Will cont oral pain meds- MS contin 75mg  BID, oxycodone- 30mg  PO Q6H.  DVT ppx- As per ortho, will hold for now. SCDs on Right leg. No lovenox or heparin for now, to reduce risk of bleeding.  07/15/2013-Addendum- Ortho PA was in the room, discussed with him about DVT prophylaxis which will not be started now because of the risk of bleeding and increased drainage.  Dispo: Disposition is deferred at this time, awaiting improvement of current medical problems.    The patient does have a current PCP [name], MD) and does need an [facility] hospital follow-up appointment after discharge.  The patient does not know have transportation limitations that hinder transportation to clinic appointments.  .Services Needed at time of discharge: Y = Yes, Blank = No PT:   OT:   RN:   Equipment:   Other:     LOS: 2 days   Kennis Carina, MD 07/13/2013, 11:37 AM

## 2013-07-14 DIAGNOSIS — N179 Acute kidney failure, unspecified: Secondary | ICD-10-CM

## 2013-07-14 DIAGNOSIS — A419 Sepsis, unspecified organism: Secondary | ICD-10-CM

## 2013-07-14 DIAGNOSIS — B0052 Herpesviral keratitis: Secondary | ICD-10-CM

## 2013-07-14 DIAGNOSIS — G8928 Other chronic postprocedural pain: Secondary | ICD-10-CM

## 2013-07-14 DIAGNOSIS — L02419 Cutaneous abscess of limb, unspecified: Secondary | ICD-10-CM

## 2013-07-14 LAB — CBC
HCT: 27.2 % — ABNORMAL LOW (ref 39.0–52.0)
MCH: 29 pg (ref 26.0–34.0)
MCV: 91.6 fL (ref 78.0–100.0)
Platelets: 320 10*3/uL (ref 150–400)
RDW: 14.8 % (ref 11.5–15.5)

## 2013-07-14 LAB — BASIC METABOLIC PANEL
BUN: 7 mg/dL (ref 6–23)
CO2: 26 mEq/L (ref 19–32)
Calcium: 8 mg/dL — ABNORMAL LOW (ref 8.4–10.5)
Chloride: 106 mEq/L (ref 96–112)
Creatinine, Ser: 1.37 mg/dL — ABNORMAL HIGH (ref 0.50–1.35)
Glucose, Bld: 133 mg/dL — ABNORMAL HIGH (ref 70–99)

## 2013-07-14 MED ORDER — MORPHINE SULFATE ER 30 MG PO TBCR
75.0000 mg | EXTENDED_RELEASE_TABLET | Freq: Two times a day (BID) | ORAL | Status: DC
  Administered 2013-07-14 – 2013-07-17 (×6): 75 mg via ORAL
  Filled 2013-07-14: qty 1
  Filled 2013-07-14 (×3): qty 2
  Filled 2013-07-14 (×2): qty 1
  Filled 2013-07-14: qty 2
  Filled 2013-07-14: qty 1
  Filled 2013-07-14: qty 2
  Filled 2013-07-14: qty 1
  Filled 2013-07-14: qty 2
  Filled 2013-07-14: qty 1

## 2013-07-14 MED ORDER — CLONAZEPAM 1 MG PO TABS
2.0000 mg | ORAL_TABLET | Freq: Four times a day (QID) | ORAL | Status: DC
  Administered 2013-07-14 – 2013-07-17 (×11): 2 mg via ORAL
  Filled 2013-07-14 (×3): qty 2
  Filled 2013-07-14: qty 4
  Filled 2013-07-14 (×2): qty 2
  Filled 2013-07-14: qty 4
  Filled 2013-07-14 (×3): qty 2

## 2013-07-14 NOTE — Progress Notes (Signed)
Subjective: 2 Days Post-Op Procedure([name]) (LRB): I&D Left Thigh Abscess (Left) Patient doing well, pain well controlled, up and walking and transferring to chair.  Denies N/V/F/C, chest pain, SOB, paresthesia, or change in appetite.  Objective: Vital signs in last 24 hours: Temp:  [97.8 F (36.6 C)-98.8 F (37.1 C)] 98.2 F (36.8 C) (12/19 1643) Pulse Rate:  [75-102] 97 (12/19 1643) Resp:  [10-23] 16 (12/19 1643) BP: (96-142)/(56-84) 122/72 mmHg (12/19 1643) SpO2:  [95 %-100 %] 99 % (12/19 1643)  Intake/Output from previous day: 12/18 0701 - 12/19 0700 In: 3150 [I.V.:2500; IV Piggyback:650] Out: 4230 [Urine:4200; Drains:30] Intake/Output this shift: Total I/O In: 725 [I.V.:475; IV Piggyback:250] Out: 1401 [Urine:1400; Stool:1]   Recent Labs  07/12/13 0519 07/13/13 0410 07/14/13 0450  HGB 9.6* 9.4* 8.6*    Recent Labs  07/13/13 0410 07/14/13 0450  WBC 13.0* 7.9  RBC 3.20* 2.97*  HCT 29.2* 27.2*  PLT 338 320    Recent Labs  07/13/13 0410 07/14/13 0450  NA 137 137  K 4.5 4.0  CL 106 106  CO2 22 26  BUN 14 7  CREATININE 1.74* 1.37*  GLUCOSE 103* 133*  CALCIUM 7.7* 8.0*   No results found for this basename: LABPT, INR,  in the last 72 hours  WD WN 57 y/o male NAD A/Ox3 appears stated age.  EOMI, mood and affect normal, respirations unlabored.  On PE pt resting in bed.  Dressing taken down wound examined.  Incision cdi, erythema and edema noted no drainage from incision.  Drains present and appear functional.  Dressing re-wrapped.  Assessment/Plan: 2 Days Post-Op Procedure([name]) (LRB): I&D Left Thigh Abscess (Left) Removed anterior drain today with no complication.  Start PT.  Continue antibiotics.   [name] 07/14/2013, 5:03 PM

## 2013-07-14 NOTE — Progress Notes (Addendum)
Internal Medicine Attending  Date: 07/14/2013  Patient name: [name] Medical record number: [id number] Date of birth: 1956-01-04 Age: 57 y.o. Gender: male  I saw and evaluated the patient, and discussed his care on AM rounds with house staff. I reviewed the resident's note by Dr. Mariea Clonts and I agree with the resident's findings and plans as documented in her note, with the following additional comments.  Cultures remain negative, and we are covering with broad-spectrum IV antibiotics; when we reach the point of transitioning to oral antibiotics, if cultures are still negative, would discuss the best choice of empiric antibiotics with ID.  Per discussion with orthopedics by Dr. Mariea Clonts, they will see patient today and address the question of DVT prophylaxis.  His pain overall is reasonably well-controlled on oral opioids dosed to approximate his home regimen.

## 2013-07-14 NOTE — Progress Notes (Signed)
PA Barry Dienes made aware that L leg JP drain will not stay charged and that pt states leg pain has increased, leg has become larger, and leg has become warmer since PA removed one of his two JP drains.  Orders received to redress JP site and continue to monitor leg. L pedal pulse +2 currently. Pt moving leg without difficulty.   Will monitor pt.

## 2013-07-14 NOTE — Progress Notes (Addendum)
Subjective: Feels better today. Lying in bed comfortably, not in any distress. Good appetite. Drains- in place, minimal drainage.  Objective: Vital signs in last 24 hours: Filed Vitals:   07/14/13 0400 07/14/13 0600 07/14/13 0752 07/14/13 1117  BP: 96/56 110/76 122/73 119/80  Pulse: 75 75 81 80  Temp:   98.3 F (36.8 C) 98.4 F (36.9 C)  TempSrc:   Oral Oral  Resp: 11 11 10 8   Height:      Weight:      SpO2: 95% 96% 100% 96%   Weight change:   Intake/Output Summary (Last 24 hours) at 07/14/13 1312 Last data filed at 07/14/13 1117  Gross per 24 hour  Intake   2875 ml  Output   4381 ml  Net  -1506 ml   General appearance: NAD, lying in bed, significant pain relief and impovement since his pain meds were adjusted. EYES- Right eye- Sclera appears slightly erythematous today, no discharge. Left eye- Sclera clear, not erythematous or icteric, pupil reactive to light, no discharge. Lungs: Clear to ausculation bilat, no rales or crackles. Heart: regular rate and rhythm, S1, S2 normal, no murmur, click, rub or gallop Abdomen: soft, non-tender; bowel sounds normal; no masses,  no organomegaly Extremities: Left Lower extremity- bandage dressing form upper thigh to distal foot, sparing the toes, pt can wriggle toes without pain, and move knee, 2 drains on the lat side of thigh, draining minimal sero-sanguinous fluid. RLE- Normal in appearance, reduced DP pulses distally, but legs are warm..  Lab Results: Basic Metabolic Panel:  Recent Labs Lab 07/13/13 0410 07/14/13 0450  NA 137 137  K 4.5 4.0  CL 106 106  CO2 22 26  GLUCOSE 103* 133*  BUN 14 7  CREATININE 1.74* 1.37*  CALCIUM 7.7* 8.0*   Liver Function Tests:  Recent Labs Lab 07/12/13 0519  AST 27  ALT 39  ALKPHOS 150*  BILITOT 0.3  PROT 5.6*  ALBUMIN 1.9*   CBC:  Recent Labs Lab 07/11/13 1149 07/12/13 0519 07/13/13 0410 07/14/13 0450  WBC 25.6* 15.6* 13.0* 7.9  NEUTROABS 21.5* 12.4*  --   --   HGB  9.6* 9.6* 9.4* 8.6*  HCT 29.0* 28.3* 29.2* 27.2*  MCV 90.1 87.9 91.3 91.6  PLT 402* 287 338 320    Micro Results: Recent Results (from the past 240 hour(s))  CULTURE, BLOOD (ROUTINE X 2)     Status: None   Collection Time    07/11/13  3:30 PM      Result Value Range Status   Specimen Description BLOOD ARM RIGHT   Final   Special Requests BOTTLES DRAWN AEROBIC ONLY 2.5CC   Final   Culture  Setup Time     Final   Value: 07/11/2013 20:22     Performed at [facility]   Culture     Final   Value:        BLOOD CULTURE RECEIVED NO GROWTH TO DATE CULTURE WILL BE HELD FOR 5 DAYS BEFORE ISSUING A FINAL NEGATIVE REPORT     Performed at [facility]   Report Status PENDING   Incomplete  MRSA PCR SCREENING     Status: None   Collection Time    07/11/13  6:24 PM      Result Value Range Status   MRSA by PCR NEGATIVE  NEGATIVE Final   Comment:            The GeneXpert MRSA Assay (FDA  approved for NASAL specimens     only), is one component of a     comprehensive MRSA colonization     surveillance program. It is not     intended to diagnose MRSA     infection nor to guide or     monitor treatment for     MRSA infections.  CULTURE, BLOOD (ROUTINE X 2)     Status: None   Collection Time    07/11/13  7:15 PM      Result Value Range Status   Specimen Description BLOOD RIGHT HAND   Final   Special Requests BOTTLES DRAWN AEROBIC ONLY 4CC   Final   Culture  Setup Time     Final   Value: 07/12/2013 01:04     Performed at [facility]   Culture     Final   Value:        BLOOD CULTURE RECEIVED NO GROWTH TO DATE CULTURE WILL BE HELD FOR 5 DAYS BEFORE ISSUING A FINAL NEGATIVE REPORT     Performed at [facility]   Report Status PENDING   Incomplete  ANAEROBIC CULTURE     Status: None   Collection Time    07/12/13  5:12 PM      Result Value Range Status   Specimen Description TISSUE LEFT THIGH   Final   Special Requests NONE   Final   Gram Stain     Final    Value: RARE WBC PRESENT,BOTH PMN AND MONONUCLEAR     NO ORGANISMS SEEN     Performed at [facility]   Culture     Final   Value: NO ANAEROBES ISOLATED; CULTURE IN PROGRESS FOR 5 DAYS     Performed at [facility]   Report Status PENDING   Incomplete  TISSUE CULTURE     Status: None   Collection Time    07/12/13  5:12 PM      Result Value Range Status   Specimen Description TISSUE LEFT THIGH   Final   Special Requests NONE   Final   Gram Stain     Final   Value: RARE WBC PRESENT,BOTH PMN AND MONONUCLEAR     NO ORGANISMS SEEN     Performed at [facility]     Final   Value: NO GROWTH 1 DAY     Performed at [facility]   Report Status PENDING   Incomplete   Studies/Results: Dg Knee 1-2 Views Left  07/13/2013   CLINICAL DATA:  Abscess, postop  EXAM: LEFT KNEE - 1-2 VIEW  COMPARISON:  None  FINDINGS: Bones appear demineralized.  Components of left knee prosthesis identified in expected positions.  Mild soft tissue swelling at the site.  No acute fracture, dislocation or bone destruction.  No periprosthetic lucency or knee joint effusion seen.  IMPRESSION: Osseous demineralization and left knee prosthesis.  No acute abnormalities.   Electronically Signed   By: Ulyses Southward M.D.   On: 07/13/2013 02:02   US Renal  07/13/2013   CLINICAL DATA:  History BPH.  Question obstructive uropathy.  EXAM: RENAL/URINARY TRACT ULTRASOUND COMPLETE  COMPARISON:  None.  FINDINGS: Right Kidney:  Length: 13 cm. Echogenicity within normal limits. No mass or hydronephrosis visualized.  Left Kidney:  Length: 13.5 cm. Echogenicity within normal limits. No mass or hydronephrosis visualized.  Bladder:  Prevoid urinary bladder 276 cc. No evidence of bladder wall abnormality.  IMPRESSION: No hydronephrosis  or other abnormality.   Electronically Signed   By: Tiburcio Pea M.D.   On: 07/13/2013 06:01   Medications: I have reviewed the patient's current  medications. Scheduled Meds: . atorvastatin  80 mg Oral Daily  . clonazePAM  2 mg Oral QID  . docusate sodium  100 mg Oral BID  . Ganciclovir  1 application Right Eye BID  . morphine  75 mg Oral Q12H  . oxyCODONE  30 mg Oral Q6H  . piperacillin-tazobactam (ZOSYN)  IV  3.375 g Intravenous Q8H  . senna  2 tablet Oral BID  . trifluridine  1 drop Right Eye QID  . vancomycin  1,250 mg Intravenous Q12H   Continuous Infusions: . sodium chloride 1,000 mL (07/13/13 1126)   PRN Meds:.ondansetron (ZOFRAN) IV, ondansetron Assessment/Plan: Principal Problem:   Abscess of left thigh Active Problems:   HSV keratitis   Acute renal failure   Chronic pain syndrome   Testosterone deficiency   Chronic use of opiate drugs therapeutic purposes   Septic shock due to abscess  Cellulitis and Abscess with Sepsis- Resolving. Had an MRI of thigh- Large fluid collection in the proximal lateral left thigh consistent with abscess, and subsequently Had drainage of the Abcess yesterday, by Orthopedics, drainage of ~1L of purulent foul smelling liquid. WBC trending down-  7.9 today < 13 < 15.6 < 25.6.  - Orthopedics- Recs Appreciated.   - Blood cultures- No growth to date, but was on antibiotics-as an out-pt prior to samples been drawn. - Cultures from Abcess- No growth, no org seen.  - Continue Vancomycin- Per pharm- 3rd day - Continue Zosyn- Per pharm - 3rd day. - Odansetron 4mg  Q6H PRN for nausea. - On regular diet.  - Pain meds- MS contin 75mg  BID, oxycodone- 30mg  PO Q6H- Vitals intially showed a respiratory rate of 8, but on checking on pt myself, RR- 14, and rechecked my pt nurse- 14. Pt stable, BP- sys- 120s, and heart rate- 84, not in any distress. If RR drops below 10, reduce Long acting MS contin to 60mg  Q12H, and reduce Oxycodone to 15mg  Q6H.  HSV Keratitis- Ophthalmology was consulted yesterday, as pt complained of some itching and reported a hx of HSV involving his eyes. Assessment by ophthal- Viral  keratoconjunctivitis, and pt was started on Viroptic and Zirgan. Pt is to follow up with [facility] post discharge.   ARF- Resolving. No records to compare and get baseline. But pt reports he has never been told he has kidney problems. Cr- 1.37  << 2.34  << 2.98, BUN- 25 today >> 30. Likley due to sepsis. BUN/Cr ratio - 10. Pt has a hx of( self reported ) BPH, consider CRF, due to obstructive uropathy. As per pt PCP, pt baseline is normal.  - Reduce IVF n/s to 81mls/hr - Renal USS- No hydronephrosis. - Urinalysis- No abnormalities.  HLD- No lipid profile on chart.  - Home meds, will continue on admission. Atorvastatin-80mg  daily.   Chronic Knee pain- Status post bilat knee replacement surgery, on Opena- 30mg  BID, Oxycodone- 30mg  Q6h daily for the past 8 years.  Will cont oral pain meds- MS contin 75mg  BID, oxycodone- 30mg  PO Q6H.  DVT ppx- As per ortho, will hold for now. SCDs on Right leg. Awaiting Ortho recs as to when DVT ppx can be restarted, as pt is at risk.   07/15/2013- Addendum- Talked to the orthopedic PA 07/14/2013 later in the day to address the issue of DVT prophylaxis, as this was  not mentioned in his note. And he says it wont be started soon, pt will continue with the SCDs and have PT work with him for now.   Dispo: Disposition is deferred at this time, awaiting improvement of current medical problems.    The patient does have a current PCP [name], MD) and does need an [facility] hospital follow-up appointment after discharge.  The patient does not know have transportation limitations that hinder transportation to clinic appointments.  .Services Needed at time of discharge: Y = Yes, Blank = No PT:   OT:   RN:   Equipment:   Other:     LOS: 3 days   Kennis Carina, MD 07/14/2013, 1:12 PM

## 2013-07-15 LAB — BASIC METABOLIC PANEL
BUN: 7 mg/dL (ref 6–23)
Calcium: 8.3 mg/dL — ABNORMAL LOW (ref 8.4–10.5)
Creatinine, Ser: 1.43 mg/dL — ABNORMAL HIGH (ref 0.50–1.35)
GFR calc non Af Amer: 54 mL/min — ABNORMAL LOW (ref >90)
Glucose, Bld: 112 mg/dL — ABNORMAL HIGH (ref 70–99)

## 2013-07-15 LAB — HEMOGLOBIN A1C: Mean Plasma Glucose: 126 mg/dL — ABNORMAL HIGH (ref <117)

## 2013-07-15 MED ORDER — VANCOMYCIN HCL IN DEXTROSE 1-5 GM/200ML-% IV SOLN
1000.0000 mg | Freq: Two times a day (BID) | INTRAVENOUS | Status: DC
  Administered 2013-07-15 – 2013-07-17 (×4): 1000 mg via INTRAVENOUS
  Filled 2013-07-15 (×5): qty 200

## 2013-07-15 MED ORDER — ENOXAPARIN SODIUM 60 MG/0.6ML ~~LOC~~ SOLN
60.0000 mg | SUBCUTANEOUS | Status: DC
  Administered 2013-07-15 – 2013-07-16 (×2): 60 mg via SUBCUTANEOUS
  Filled 2013-07-15 (×4): qty 0.6

## 2013-07-15 NOTE — Progress Notes (Signed)
Holes in drain can be seen. JP drain not keeping a charge. Per Andrez Grime, PA D/C drain

## 2013-07-15 NOTE — Evaluation (Signed)
Physical Therapy Evaluation Patient Details Name: [name] MRN: [id number] DOB: Jan 10, 1956 Today's Date: 07/15/2013 Time: 4098-1191 PT Time Calculation (min): 23 min  PT Assessment / Plan / Recommendation History of Present Illness  Patient is a 57 yo male admitted with Lt thigh abscess.  Patient s/p I&D.  Clinical Impression  Patient is at supervision level for all mobility and ambulation.  Good balance with gait with no assistive device.  Encouraged patient to ambulate 2-3x/day with nursing/family.  Instructed patient on ankle pumps and to keep LLE elevated for edema.  No acute PT needs identified - will sign off.    PT Assessment  Patent does not need any further PT services    Follow Up Recommendations  No PT follow up;Supervision - Intermittent    Does the patient have the potential to tolerate intense rehabilitation      Barriers to Discharge        Equipment Recommendations  None recommended by PT    Recommendations for Other Services     Frequency      Precautions / Restrictions Precautions Precautions: None Restrictions Weight Bearing Restrictions: Yes LLE Weight Bearing: Weight bearing as tolerated   Pertinent Vitals/Pain       Mobility  Bed Mobility Bed Mobility: Supine to Sit;Sitting - Scoot to Edge of Bed;Sit to Supine Supine to Sit: 5: Supervision;HOB elevated Sitting - Scoot to Edge of Bed: 5: Supervision Sit to Supine: 5: Supervision;HOB elevated Details for Bed Mobility Assistance: No cues or assist needed for mobility Transfers Transfers: Sit to Stand;Stand to Sit Sit to Stand: 5: Supervision;From bed Stand to Sit: 5: Supervision;To bed Details for Transfer Assistance: No cues or assist needed.  Elevated LLE on 4 pillows once returned to bed to address edema. Ambulation/Gait Ambulation/Gait Assistance: 5: Supervision Ambulation Distance (Feet): 140 Feet Assistive device: None Ambulation/Gait Assistance Details: Cues to stand upright and  look forward during gait.  Patient with slightly flexed posture and antalgic gait.  Balance good with no assistive device. Gait Pattern: Step-through pattern;Decreased stance time - left;Decreased step length - right;Antalgic;Trunk flexed Gait velocity: Slow gait speed    Exercises General Exercises - Lower Extremity Ankle Circles/Pumps: AROM;Both;10 reps;Supine (Keep LLE elevated and do ankle pumps to decrease edema)      PT Goals(Current goals can be found in the care plan section)  N/A  Visit Information  Last PT Received On: 07/15/13 Assistance Needed: +1 History of Present Illness: Patient is a 57 yo male admitted with Lt thigh abscess.  Patient s/p I&D.       Prior Functioning  Home Living Family/patient expects to be discharged to:: Private residence Living Arrangements: Spouse/significant other Available Help at Discharge: Family;Available 24 hours/day Type of Home: House Home Access: Stairs to enter [employer] of Steps: 3 Entrance Stairs-Rails: Right;Left Home Layout: Two level;Bed/bath upstairs Alternate Level Stairs-Number of Steps: flight Alternate Level Stairs-Rails: Right;Left Home Equipment: Walker - 2 wheels;Cane - single point;Crutches;Bedside commode;Shower seat - built in Prior Function Level of Independence: Independent Communication Communication: No difficulties    Cognition  Cognition Arousal/Alertness: Awake/alert Behavior During Therapy: WFL for tasks assessed/performed Overall Cognitive Status: Within Functional Limits for tasks assessed    Extremity/Trunk Assessment Upper Extremity Assessment Upper Extremity Assessment: Overall WFL for tasks assessed Lower Extremity Assessment Lower Extremity Assessment: LLE deficits/detail LLE Deficits / Details: Decreased strength and ROM due to pain, surgery, edema. LLE Coordination: decreased gross motor Cervical / Trunk Assessment Cervical / Trunk Assessment: Normal   Balance  Balance Balance Assessed: Yes High Level Balance High Level Balance Activites: Sudden stops;Turns;Head turns High Level Balance Comments: No loss of balance with high level balance activities  End of Session PT - End of Session Activity Tolerance: Patient limited by pain Patient left: in bed;with call bell/phone within reach Nurse Communication: Mobility status (Encouraged ambulation in hallway 2-3x/day with nsg/family)  GP     Vena Austria 07/15/2013, 6:02 PM Durenda Hurt. Renaldo Fiddler, [facility] Acute Rehab Services Pager [phone]

## 2013-07-15 NOTE — Progress Notes (Signed)
ANTIBIOTIC CONSULT NOTE Pharmacy Consult for vanc/zosyn Indication: Cellulitis/abscess  No Known Allergies  Patient Measurements: Height: 6\' 4"  (193 cm) Weight: 277 lb 12.5 oz (126 kg) IBW/kg (Calculated) : 86.8  Vital Signs: Temp: 98.1 F (36.7 C) (12/20 0800) Temp src: Oral (12/20 0800) BP: 132/86 mmHg (12/20 0800) Intake/Output from previous day: 12/19 0701 - 12/20 0700 In: 1887.5 [I.V.:1262.5; IV Piggyback:625] Out: 4726 [Urine:4725; Stool:1] Intake/Output from this shift: Total I/O In: 1200 [P.O.:1200] Out: 900 [Urine:900]  Labs:  Recent Labs  07/13/13 0410 07/14/13 0450 07/15/13 0405  WBC 13.0* 7.9  --   HGB 9.4* 8.6*  --   PLT 338 320  --   CREATININE 1.74* 1.37* 1.43*   Estimated Creatinine Clearance: 84.6 ml/min (by C-G formula based on Cr of 1.43).  Recent Labs  07/15/13 0950  VANCOTROUGH 16.7     Microbiology: Recent Results (from the past 720 hour(s))  CULTURE, BLOOD (ROUTINE X 2)     Status: None   Collection Time    07/11/13  3:30 PM      Result Value Range Status   Specimen Description BLOOD ARM RIGHT   Final   Special Requests BOTTLES DRAWN AEROBIC ONLY 2.5CC   Final   Culture  Setup Time     Final   Value: 07/11/2013 20:22     Performed at [facility]   Culture     Final   Value:        BLOOD CULTURE RECEIVED NO GROWTH TO DATE CULTURE WILL BE HELD FOR 5 DAYS BEFORE ISSUING A FINAL NEGATIVE REPORT     Performed at [facility]   Report Status PENDING   Incomplete  MRSA PCR SCREENING     Status: None   Collection Time    07/11/13  6:24 PM      Result Value Range Status   MRSA by PCR NEGATIVE  NEGATIVE Final   Comment:            The GeneXpert MRSA Assay (FDA     approved for NASAL specimens     only), is one component of a     comprehensive MRSA colonization     surveillance program. It is not     intended to diagnose MRSA     infection nor to guide or     monitor treatment for     MRSA infections.  CULTURE,  BLOOD (ROUTINE X 2)     Status: None   Collection Time    07/11/13  7:15 PM      Result Value Range Status   Specimen Description BLOOD RIGHT HAND   Final   Special Requests BOTTLES DRAWN AEROBIC ONLY 4CC   Final   Culture  Setup Time     Final   Value: 07/12/2013 01:04     Performed at [facility]   Culture     Final   Value:        BLOOD CULTURE RECEIVED NO GROWTH TO DATE CULTURE WILL BE HELD FOR 5 DAYS BEFORE ISSUING A FINAL NEGATIVE REPORT     Performed at [facility]   Report Status PENDING   Incomplete  ANAEROBIC CULTURE     Status: None   Collection Time    07/12/13  5:12 PM      Result Value Range Status   Specimen Description TISSUE LEFT THIGH   Final   Special Requests NONE   Final   Gram Stain  Final   Value: RARE WBC PRESENT,BOTH PMN AND MONONUCLEAR     NO ORGANISMS SEEN     Performed at [facility]   Culture     Final   Value: NO ANAEROBES ISOLATED; CULTURE IN PROGRESS FOR 5 DAYS     Performed at [facility]   Report Status PENDING   Incomplete  TISSUE CULTURE     Status: None   Collection Time    07/12/13  5:12 PM      Result Value Range Status   Specimen Description TISSUE LEFT THIGH   Final   Special Requests NONE   Final   Gram Stain     Final   Value: RARE WBC PRESENT,BOTH PMN AND MONONUCLEAR     NO ORGANISMS SEEN     Performed at [facility]   Culture     Final   Value: NO GROWTH 2 DAYS     Performed at [facility]   Report Status PENDING   Incomplete    Medical History: Past Medical History  Diagnosis Date  . Hypertension     Medications:  Prescriptions prior to admission  Medication Sig Dispense Refill  . atenolol (TENORMIN) 100 MG tablet Take 100 mg by mouth daily.      Marland Kitchen atorvastatin (LIPITOR) 80 MG tablet Take 80 mg by mouth daily.      . cephALEXin (KEFLEX) 500 MG capsule Take 500 mg by mouth 4 (four) times daily.      . clonazePAM (KLONOPIN) 2 MG tablet Take 2 mg by mouth 4 (four)  times daily.      . Diclofenac Sodium (PENNSAID) 1.5 % SOLN Place 8 drops onto the skin 2 (two) times daily. *4 drops onto both knees*      . diphenhydrAMINE (BENADRYL) 25 mg capsule Take 25 mg by mouth daily as needed for allergies.      Marland Kitchen oxycodone (ROXICODONE) 30 MG immediate release tablet Take 30 mg by mouth 4 (four) times daily.       Marland Kitchen oxymorphone (OPANA ER) 30 MG 12 hr tablet Take 30 mg by mouth every 12 (twelve) hours.      Marland Kitchen sulfamethoxazole-trimethoprim (BACTRIM DS) 800-160 MG per tablet Take 1 tablet by mouth 2 (two) times daily.       Scheduled:  . atorvastatin  80 mg Oral Daily  . clonazePAM  2 mg Oral QID  . docusate sodium  100 mg Oral BID  . Ganciclovir  1 application Right Eye BID  . morphine  75 mg Oral Q12H  . oxyCODONE  30 mg Oral Q6H  . piperacillin-tazobactam (ZOSYN)  IV  3.375 g Intravenous Q8H  . senna  2 tablet Oral BID  . trifluridine  1 drop Right Eye QID  . vancomycin  1,000 mg Intravenous Q12H   Assessment: 57 yo who was seen in the ED for thigh swelling, and sepsis. Patient completed I&D yesterday. Patient is currently afebrile, wbc trending down, and renal function has improved. Will continue Vanc and zosyn  Vancomycin trough is 16.7, slightly supra therapeutic  Goal of Therapy:  Vancomycin trough level 10-15 mcg/ml  Plan:  -Decrease vancomycin to 1 Gram iv Q 12 hour -Continue Zosyn 3.375g IV q8   Thank you Okey Regal, PharmD [phone]

## 2013-07-15 NOTE — Progress Notes (Signed)
Subjective: No complaint today. Itching of Rt eye has reduced in severity. No leg swelling or complaints of calf pain.  Objective: Vital signs in last 24 hours: Filed Vitals:   07/15/13 0000 07/15/13 0400 07/15/13 0800 07/15/13 1133  BP: 118/83 103/60 132/86 127/68  Pulse:    79  Temp: 98.2 F (36.8 C)  98.1 F (36.7 C) 98.4 F (36.9 C)  TempSrc: Oral  Oral Oral  Resp: 18 14 11 12   Height:      Weight:      SpO2: 99% 99%     Weight change:   Intake/Output Summary (Last 24 hours) at 07/15/13 1237 Last data filed at 07/15/13 1036  Gross per 24 hour  Intake 2362.5 ml  Output   4625 ml  Net -2262.5 ml   General appearance: NAD, lying in bed, significant pain relief and impovement since his pain meds were adjusted. EYES- Right eye- Sclera still appears slightly erythematous today, no discharge. Left eye- Sclera clear, not erythematous or icteric, pupil reactive to light, no discharge. Lungs- no rales or crackles. Heart: regular rate and rhythm, S1, S2 normal, no murmur, click, rub or gallop Abdomen: soft, non-tender; bowel sounds normal; no masses,  no organomegaly Extremities: Left Lower extremity- bandage dressing form upper thigh only, pt can wriggle toes without pain, and move knee, 1 drain in place on the lat side of thigh, draining minimal sero-sanguinous fluid. RLE- Normal in appearance, reduced DP pulses distally, but legs are warm, no calf tenderness.  Lab Results: Basic Metabolic Panel:  Recent Labs Lab 07/14/13 0450 07/15/13 0405  NA 137 141  K 4.0 4.0  CL 106 103  CO2 26 28  GLUCOSE 133* 112*  BUN 7 7  CREATININE 1.37* 1.43*  CALCIUM 8.0* 8.3*   Liver Function Tests:  Recent Labs Lab 07/12/13 0519  AST 27  ALT 39  ALKPHOS 150*  BILITOT 0.3  PROT 5.6*  ALBUMIN 1.9*   CBC:  Recent Labs Lab 07/11/13 1149 07/12/13 0519 07/13/13 0410 07/14/13 0450  WBC 25.6* 15.6* 13.0* 7.9  NEUTROABS 21.5* 12.4*  --   --   HGB 9.6* 9.6* 9.4* 8.6*  HCT  29.0* 28.3* 29.2* 27.2*  MCV 90.1 87.9 91.3 91.6  PLT 402* 287 338 320    Micro Results: Recent Results (from the past 240 hour(s))  CULTURE, BLOOD (ROUTINE X 2)     Status: None   Collection Time    07/11/13  3:30 PM      Result Value Range Status   Specimen Description BLOOD ARM RIGHT   Final   Special Requests BOTTLES DRAWN AEROBIC ONLY 2.5CC   Final   Culture  Setup Time     Final   Value: 07/11/2013 20:22     Performed at [facility]   Culture     Final   Value:        BLOOD CULTURE RECEIVED NO GROWTH TO DATE CULTURE WILL BE HELD FOR 5 DAYS BEFORE ISSUING A FINAL NEGATIVE REPORT     Performed at [facility]   Report Status PENDING   Incomplete  MRSA PCR SCREENING     Status: None   Collection Time    07/11/13  6:24 PM      Result Value Range Status   MRSA by PCR NEGATIVE  NEGATIVE Final   Comment:            The GeneXpert MRSA Assay (FDA     approved for  NASAL specimens     only), is one component of a     comprehensive MRSA colonization     surveillance program. It is not     intended to diagnose MRSA     infection nor to guide or     monitor treatment for     MRSA infections.  CULTURE, BLOOD (ROUTINE X 2)     Status: None   Collection Time    07/11/13  7:15 PM      Result Value Range Status   Specimen Description BLOOD RIGHT HAND   Final   Special Requests BOTTLES DRAWN AEROBIC ONLY 4CC   Final   Culture  Setup Time     Final   Value: 07/12/2013 01:04     Performed at [facility]   Culture     Final   Value:        BLOOD CULTURE RECEIVED NO GROWTH TO DATE CULTURE WILL BE HELD FOR 5 DAYS BEFORE ISSUING A FINAL NEGATIVE REPORT     Performed at [facility]   Report Status PENDING   Incomplete  ANAEROBIC CULTURE     Status: None   Collection Time    07/12/13  5:12 PM      Result Value Range Status   Specimen Description TISSUE LEFT THIGH   Final   Special Requests NONE   Final   Gram Stain     Final   Value: RARE WBC  PRESENT,BOTH PMN AND MONONUCLEAR     NO ORGANISMS SEEN     Performed at [facility]   Culture     Final   Value: NO ANAEROBES ISOLATED; CULTURE IN PROGRESS FOR 5 DAYS     Performed at [facility]   Report Status PENDING   Incomplete  TISSUE CULTURE     Status: None   Collection Time    07/12/13  5:12 PM      Result Value Range Status   Specimen Description TISSUE LEFT THIGH   Final   Special Requests NONE   Final   Gram Stain     Final   Value: RARE WBC PRESENT,BOTH PMN AND MONONUCLEAR     NO ORGANISMS SEEN     Performed at [facility]   Culture     Final   Value: NO GROWTH 2 DAYS     Performed at [facility]   Report Status PENDING   Incomplete   Studies/Results: No results found. Medications: I have reviewed the patient's current medications. Scheduled Meds: . atorvastatin  80 mg Oral Daily  . clonazePAM  2 mg Oral QID  . docusate sodium  100 mg Oral BID  . Ganciclovir  1 application Right Eye BID  . morphine  75 mg Oral Q12H  . oxyCODONE  30 mg Oral Q6H  . piperacillin-tazobactam (ZOSYN)  IV  3.375 g Intravenous Q8H  . senna  2 tablet Oral BID  . trifluridine  1 drop Right Eye QID  . vancomycin  1,000 mg Intravenous Q12H   Continuous Infusions: . sodium chloride 75 mL/hr at 07/14/13 2214   PRN Meds:.ondansetron (ZOFRAN) IV, ondansetron Assessment/Plan:  Cellulitis and Abscess with Sepsis- Resolving. Had an MRI of thigh- Large fluid collection in the proximal lateral left thigh consistent with abscess, and subsequently Had drainage of the Abcess 3 days ago, by Orthopedics, drainage of ~1L of purulent foul smelling liquid. WBC trending down- 7.9 two day ago. - Orthopedics- Recs Appreciated-  Start DVT ppx, with Lovenox.   - Blood cultures- No growth to date, but was on antibiotics-as an out-pt prior to samples been drawn. - Cultures from Abcess- No growth, no org seen.  - Continue Vancomycin- Per pharm- 4th day - Continue Zosyn- Per  pharm - 4th day. - Odansetron 4mg  Q6H PRN for nausea. - On regular diet.  - Pain meds- MS contin 75mg  BID, oxycodone- 30mg  PO Q6H. If RR drops below 10, reduce Long acting MS contin to 60mg  Q12H, and reduce Oxycodone to 15mg  Q6H. - Transfer to med-surg. 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 Assessment by ophthal- Viral keratoconjunctivitis, and pt was started on Viroptic and Zirgan. Pt is to follow up with [facility] post discharge. - Cont Viroptic an dZirgan.   ARF- Resolving. No records to compare and get baseline. But pt reports he has never been told he has kidney problems. Cr- slight increase today to 1.48, from 1.37 yesterday. But marked improvement from 2.98 On admission. Slight bump possible due to Vancomycin therapy.  Likley due to sepsis. Pt has a hx of( self reported ) BPH, consider CRF, due to obstructive uropathy. As per pt PCP, pt baseline is normal.  -D/c IVF n/s 40mls/hr - Renal USS- No hydronephrosis. - Urinalysis- No abnormalities. - Continue to monitor BMP.  HLD- No lipid profile on chart.  - Home meds, will continue on admission. Atorvastatin-80mg  daily.   Chronic Knee pain- Status post bilat knee replacement surgery, on Opena- 30mg  BID, Oxycodone- 30mg  Q6h daily for the past 8 years.  Will cont oral pain meds- MS contin 75mg  BID, oxycodone- 30mg  PO Q6H.  DVT ppx- Can start Lovenox, as per ortho recs.  Dispo: Disposition is deferred at this time, awaiting improvement of current medical problems.    The patient does have a current PCP [name], MD) and does need an [facility] hospital follow-up appointment after discharge.  The patient does not know have transportation limitations that hinder transportation to clinic appointments.  .Services Needed at time of discharge: Y = Yes, Blank = No PT:   OT:   RN:    Equipment:   Other:     LOS: 4 days   Kennis Carina, MD 07/15/2013, 12:37 PM

## 2013-07-15 NOTE — Progress Notes (Signed)
Subjective: 3 Days Post-Op Procedure(s) (LRB): I&D Left Thigh Abscess (Left) Patient reports pain as mild.  No n/f/v/c.  Anterior drain removed yesterday.  Posterior drain still in.  Pt on IV abx.  Objective: Vital signs in last 24 hours: Temp:  [98 F (36.7 C)-98.4 F (36.9 C)] 98.4 F (36.9 C) (12/20 1133) Pulse Rate:  [79-97] 79 (12/20 1133) Resp:  [11-18] 12 (12/20 1133) BP: (103-132)/(60-86) 127/68 mmHg (12/20 1133) SpO2:  [99 %] 99 % (12/20 0400)  Intake/Output from previous day: 12/19 0701 - 12/20 0700 In: 1887.5 [I.V.:1262.5; IV Piggyback:625] Out: 4726 [Urine:4725; Stool:1] Intake/Output this shift: Total I/O In: 1200 [P.O.:1200] Out: 900 [Urine:900]   Recent Labs  07/13/13 0410 07/14/13 0450  HGB 9.4* 8.6*    Recent Labs  07/13/13 0410 07/14/13 0450  WBC 13.0* 7.9  RBC 3.20* 2.97*  HCT 29.2* 27.2*  PLT 338 320    Recent Labs  07/14/13 0450 07/15/13 0405  NA 137 141  K 4.0 4.0  CL 106 103  CO2 26 28  BUN 7 7  CREATININE 1.37* 1.43*  GLUCOSE 133* 112*  CALCIUM 8.0* 8.3*   No results found for this basename: LABPT, INR,  in the last 72 hours  Micro:  No growth to date.  PE:  cellulitis resolved.  Thigh swelling decreased.  Incision is c/d/i.  Scant serosang drainage in drain.  No purulence noted.  Assessment/Plan: 3 Days Post-Op Procedure(s) (LRB): I&D Left Thigh Abscess (Left)  Continue IV abx per primary team and tailor per cx results.  Drain will be removed tomorrow or Monday based on how much drainage there is.  OK to start lovenox.  Pt may bear weight as tolerated on the L LE.  [name] 07/15/2013, 12:42 PM

## 2013-07-16 LAB — BASIC METABOLIC PANEL
BUN: 6 mg/dL (ref 6–23)
Calcium: 8.6 mg/dL (ref 8.4–10.5)
Chloride: 105 mEq/L (ref 96–112)
Creatinine, Ser: 1.52 mg/dL — ABNORMAL HIGH (ref 0.50–1.35)
GFR calc Af Amer: 58 mL/min — ABNORMAL LOW (ref >90)
Sodium: 143 mEq/L (ref 135–145)

## 2013-07-16 LAB — CBC
MCHC: 31.1 g/dL (ref 30.0–36.0)
MCV: 92.6 fL (ref 78.0–100.0)
Platelets: 405 10*3/uL — ABNORMAL HIGH (ref 150–400)
RDW: 14.3 % (ref 11.5–15.5)
WBC: 10.7 10*3/uL — ABNORMAL HIGH (ref 4.0–10.5)

## 2013-07-16 LAB — TISSUE CULTURE: Culture: NO GROWTH

## 2013-07-16 MED ORDER — SODIUM CHLORIDE 0.9 % IV SOLN: INTRAVENOUS | Status: DC

## 2013-07-16 NOTE — Progress Notes (Addendum)
Subjective: Lying in bed comfortably. Not in any distress. Having breakfast. Second drain has ben removed. Reduced thigh swelling and redness. Induration appears to be resolving.  Objective: Vital signs in last 24 hours: Filed Vitals:   07/15/13 1133 07/15/13 1632 07/15/13 2047 07/16/13 0520  BP: 127/68 149/76 135/75 130/80  Pulse: 79 84 85 81  Temp: 98.4 F (36.9 C) 98.3 F (36.8 C) 98.1 F (36.7 C) 97.8 F (36.6 C)  TempSrc: Oral Oral Oral Oral  Resp: 12 18 18 18   Height:      Weight:      SpO2:  99% 99% 99%   Weight change:   Intake/Output Summary (Last 24 hours) at 07/16/13 0913 Last data filed at 07/16/13 1610  Gross per 24 hour  Intake    740 ml  Output   2650 ml  Net  -1910 ml   General appearance: NAD, lying in bed, significant pain relief and impovement since his pain meds were adjusted. EYES- Right eye- Sclera still appears slightly erythematous today, no discharge. Left eye- Sclera clear, not erythematous or icteric, pupil reactive to light, no discharge. Lungs- no rales or crackles. Heart: regular rate and rhythm, S1, S2 normal, no murmur, click, rub or gallop Abdomen: soft, non-tender; bowel sounds normal; no masses,  no organomegaly Extremities: Left Lower extremity- bandage dressing on mid thigh only, around site of incision, some drainage present on dressing, no drains present, leg appears better. RLE- Normal in appearance, reduced DP pulses distally, but legs are warm, no calf tenderness.  Lab Results: Basic Metabolic Panel:  Recent Labs Lab 07/15/13 0405 07/16/13 0535  NA 141 143  K 4.0 4.9  CL 103 105  CO2 28 30  GLUCOSE 112* 95  BUN 7 6  CREATININE 1.43* 1.52*  CALCIUM 8.3* 8.6   Liver Function Tests:  Recent Labs Lab 07/12/13 0519  AST 27  ALT 39  ALKPHOS 150*  BILITOT 0.3  PROT 5.6*  ALBUMIN 1.9*   CBC:  Recent Labs Lab 07/11/13 1149 07/12/13 0519  07/14/13 0450 07/16/13 0535  WBC 25.6* 15.6*  < > 7.9 10.7*  NEUTROABS  21.5* 12.4*  --   --   --   HGB 9.6* 9.6*  < > 8.6* 9.7*  HCT 29.0* 28.3*  < > 27.2* 31.2*  MCV 90.1 87.9  < > 91.6 92.6  PLT 402* 287  < > 320 405*  < > = values in this interval not displayed.  Micro Results: Recent Results (from the past 240 hour(s))  CULTURE, BLOOD (ROUTINE X 2)     Status: None   Collection Time    07/11/13  3:30 PM      Result Value Range Status   Specimen Description BLOOD ARM RIGHT   Final   Special Requests BOTTLES DRAWN AEROBIC ONLY 2.5CC   Final   Culture  Setup Time     Final   Value: 07/11/2013 20:22     Performed at [facility]   Culture     Final   Value:        BLOOD CULTURE RECEIVED NO GROWTH TO DATE CULTURE WILL BE HELD FOR 5 DAYS BEFORE ISSUING A FINAL NEGATIVE REPORT     Performed at [facility]   Report Status PENDING   Incomplete  MRSA PCR SCREENING     Status: None   Collection Time    07/11/13  6:24 PM      Result Value Range Status  MRSA by PCR NEGATIVE  NEGATIVE Final   Comment:            The GeneXpert MRSA Assay (FDA     approved for NASAL specimens     only), is one component of a     comprehensive MRSA colonization     surveillance program. It is not     intended to diagnose MRSA     infection nor to guide or     monitor treatment for     MRSA infections.  CULTURE, BLOOD (ROUTINE X 2)     Status: None   Collection Time    07/11/13  7:15 PM      Result Value Range Status   Specimen Description BLOOD RIGHT HAND   Final   Special Requests BOTTLES DRAWN AEROBIC ONLY 4CC   Final   Culture  Setup Time     Final   Value: 07/12/2013 01:04     Performed at [facility]   Culture     Final   Value:        BLOOD CULTURE RECEIVED NO GROWTH TO DATE CULTURE WILL BE HELD FOR 5 DAYS BEFORE ISSUING A FINAL NEGATIVE REPORT     Performed at [facility]   Report Status PENDING   Incomplete  ANAEROBIC CULTURE     Status: None   Collection Time    07/12/13  5:12 PM      Result Value Range Status    Specimen Description TISSUE LEFT THIGH   Final   Special Requests NONE   Final   Gram Stain     Final   Value: RARE WBC PRESENT,BOTH PMN AND MONONUCLEAR     NO ORGANISMS SEEN     Performed at [facility]   Culture     Final   Value: NO ANAEROBES ISOLATED; CULTURE IN PROGRESS FOR 5 DAYS     Performed at [facility]   Report Status PENDING   Incomplete  TISSUE CULTURE     Status: None   Collection Time    07/12/13  5:12 PM      Result Value Range Status   Specimen Description TISSUE LEFT THIGH   Final   Special Requests NONE   Final   Gram Stain     Final   Value: RARE WBC PRESENT,BOTH PMN AND MONONUCLEAR     NO ORGANISMS SEEN     Performed at [facility]   Culture     Final   Value: NO GROWTH 3 DAYS     Performed at [facility]   Report Status 07/16/2013 FINAL   Final   Studies/Results: No results found. Medications: I have reviewed the patient's current medications. Scheduled Meds: . atorvastatin  80 mg Oral Daily  . clonazePAM  2 mg Oral QID  . docusate sodium  100 mg Oral BID  . enoxaparin (LOVENOX) injection  60 mg Subcutaneous Q24H  . Ganciclovir  1 application Right Eye BID  . morphine  75 mg Oral Q12H  . oxyCODONE  30 mg Oral Q6H  . piperacillin-tazobactam (ZOSYN)  IV  3.375 g Intravenous Q8H  . senna  2 tablet Oral BID  . trifluridine  1 drop Right Eye QID  . vancomycin  1,000 mg Intravenous Q12H   Continuous Infusions:   PRN Meds:.ondansetron (ZOFRAN) IV, ondansetron Assessment/Plan:  Cellulitis and Abscess with Sepsis- Resolving. Had an MRI of thigh- Large fluid collection in the proximal lateral left  thigh consistent with abscess, and subsequently Had drainage of the Abcess 3 days ago, by Orthopedics, drainage of ~1L of purulent foul smelling liquid. WBC trending down- 7.9 two days ago, but today WBC- elevated 10.  - Orthopedics- Recs Appreciated- Start DVT ppx, with Lovenox.   - Blood cultures- No growth to date, but was  on antibiotics-as an out-pt prior to samples been drawn. - Cultures from Abcess- No growth, no org seen.  - Continue Vancomycin- Per pharm- 5th day, Vanc trough still within normal limits- 07/15/2013. - Continue Zosyn- Per pharm - 5th day. - Odansetron 4mg  Q6H PRN for nausea. - Continue to monitor CBC- WBC.  - Pain meds- MS contin 75mg  BID, oxycodone- 30mg  PO Q6H. If RR drops below 10, reduce Long acting MS contin to 60mg  Q12H, and reduce Oxycodone to 15mg  Q6H. - HBA1c was ordered, as BMP showed elevated glucose- HBA1c- 6.0 - Consider ID consult.  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 Assessment by ophthal- Viral keratoconjunctivitis, and pt was started on Viroptic and Zirgan. Pt is to follow up with [facility] post discharge. - Cont Viroptic and Zirgan.   ARF- Resolving. No records to compare and get baseline. But pt reports he has never been told he has kidney problems.  Also pts Cr appears to be increasing in the past 2 days. 1.52 today > 1.43 yesterday, > 1.43 <1.52  But marked improvement from 2.98 On admission. Slight bump possible due to Vancomycin therapy.  Likley due to sepsis. Pt has a hx of( self reported ) BPH, consider CRF, due to obstructive uropathy. As per pt PCP, pt baseline is normal. Renal USS- No hydronephrosis and Urinalysis- No abnormalities. - Will restart IVF n/s 18mls/hr in the light of increasing Cr. - Continue to monitor BMP.  HLD- No lipid profile on chart.  - Home meds, will continue on admission. Atorvastatin-80mg  daily.   Chronic Knee pain- Status post bilat knee replacement surgery, on Opena- 30mg  BID, Oxycodone- 30mg  Q6h daily for the past 8 years.  Will cont oral pain meds- MS contin 75mg  BID, oxycodone- 30mg  PO Q6H.  DVT ppx- Can start Lovenox, as per ortho recs.  Dispo: Disposition is deferred at this time, awaiting improvement of current medical problems.    The  patient does have a current PCP [name], MD) and does need an [facility] hospital follow-up appointment after discharge.  The patient does not know have transportation limitations that hinder transportation to clinic appointments.  .Services Needed at time of discharge: Y = Yes, Blank = No PT:   OT:   RN:   Equipment:   Other:     LOS: 5 days   Kennis Carina, MD 07/16/2013, 9:13 AM

## 2013-07-16 NOTE — Progress Notes (Signed)
   Subjective: 4 Days Post-Op Procedure(s) (LRB): I&D Left Thigh Abscess (Left) Patient reports pain as mild.   Patient seen in rounds with Dr. [name]. Patient is well, and has had no acute complaints or problems. He reports that he is feeling much better. No issues overnight. No SOB or chest pain.  Plan is to go Home after hospital stay.  Objective: Vital signs in last 24 hours: Temp:  [97.8 F (36.6 C)-98.4 F (36.9 C)] 97.8 F (36.6 C) (12/21 0520) Pulse Rate:  [79-85] 81 (12/21 0520) Resp:  [12-18] 18 (12/21 0520) BP: (127-149)/(68-80) 130/80 mmHg (12/21 0520) SpO2:  [99 %] 99 % (12/21 0520)  Intake/Output from previous day:  Intake/Output Summary (Last 24 hours) at 07/16/13 0952 Last data filed at 07/16/13 0811  Gross per 24 hour  Intake    740 ml  Output   2650 ml  Net  -1910 ml    Intake/Output this shift: Total I/O In: -  Out: 400 [Urine:400]  Labs:  Recent Labs  07/14/13 0450 07/16/13 0535  HGB 8.6* 9.7*    Recent Labs  07/14/13 0450 07/16/13 0535  WBC 7.9 10.7*  RBC 2.97* 3.37*  HCT 27.2* 31.2*  PLT 320 405*    Recent Labs  07/15/13 0405 07/16/13 0535  NA 141 143  K 4.0 4.9  CL 103 105  CO2 28 30  BUN 7 6  CREATININE 1.43* 1.52*  GLUCOSE 112* 95  CALCIUM 8.3* 8.6    EXAM General - Patient is Alert and Oriented Extremity - Neurologically intact Dorsiflexion/Plantar flexion intact Compartment soft Dressing/Incision - sero-sanguinous drainage, minimal Motor Function - intact, moving foot and toes well on exam.   Past Medical History  Diagnosis Date  . Hypertension     Assessment/Plan: 4 Days Post-Op Procedure(s) (LRB): I&D Left Thigh Abscess (Left) Principal Problem:   Abscess of left thigh Active Problems:   Acute renal failure   Chronic pain syndrome   Testosterone deficiency   Chronic use of opiate drugs therapeutic purposes   Septic shock due to abscess   HSV keratitis  Estimated body mass index is 33.83 kg/(m^2)  as calculated from the following:   Height as of this encounter: 6\' 4"  (1.93 m).   Weight as of this encounter: 126 kg (277 lb 12.5 oz). Advance diet  DVT Prophylaxis - Lovenox  Continue to monitor. Appears to be improving. Reinforce dressing as needed. Continue IV antibiotics. WBAT on left LE.  [name] 07/16/2013, 9:52 AM

## 2013-07-17 LAB — CULTURE, BLOOD (ROUTINE X 2): Culture: NO GROWTH

## 2013-07-17 LAB — CBC
HCT: 30.8 % — ABNORMAL LOW (ref 39.0–52.0)
MCHC: 31.5 g/dL (ref 30.0–36.0)
MCV: 92.8 fL (ref 78.0–100.0)
RDW: 14.3 % (ref 11.5–15.5)
WBC: 9.4 10*3/uL (ref 4.0–10.5)

## 2013-07-17 LAB — BASIC METABOLIC PANEL
BUN: 8 mg/dL (ref 6–23)
Calcium: 8.6 mg/dL (ref 8.4–10.5)
Chloride: 103 mEq/L (ref 96–112)
Creatinine, Ser: 1.53 mg/dL — ABNORMAL HIGH (ref 0.50–1.35)
GFR calc Af Amer: 57 mL/min — ABNORMAL LOW (ref >90)
GFR calc non Af Amer: 50 mL/min — ABNORMAL LOW (ref >90)
Potassium: 4.1 mEq/L (ref 3.5–5.1)

## 2013-07-17 LAB — ANAEROBIC CULTURE

## 2013-07-17 MED ORDER — GANCICLOVIR 0.15 % OP GEL: 1.0000 "application " | Freq: Two times a day (BID) | OPHTHALMIC | Status: DC

## 2013-07-17 MED ORDER — DOXYCYCLINE HYCLATE 100 MG PO CAPS: 100.0000 mg | ORAL_CAPSULE | Freq: Two times a day (BID) | ORAL | Status: DC

## 2013-07-17 MED ORDER — TRIFLURIDINE 1 % OP SOLN: 1.0000 [drp] | Freq: Four times a day (QID) | OPHTHALMIC | Status: DC

## 2013-07-17 NOTE — Progress Notes (Signed)
Internal Medicine Attending  Date: 07/17/2013  Patient name: [name] Medical record number: [id number] Date of birth: June 16, 1956 Age: 57 y.o. Gender: male  I saw and evaluated the patient and discussed his care on a.m. rounds with house staff.  Patient is doing well, ambulating without difficulty; his thigh erythema and swelling are much improved.  I reviewed the resident's note by Dr. Mariea Clonts and I agree with the resident's findings and plans as documented in her note, including plans to discharge home today on oral doxycycline.  Patient should followup with his primary care physician next week, and with orthopedic surgery and ophthalmology as per their recommendations.

## 2013-07-17 NOTE — Progress Notes (Signed)
Name: [name] MRN: [id number] DOB: 03/29/1956 57 y.o. PCP: [name], MD  Date of Admission: 07/11/2013 11:14 AM Date of Discharge: 07/17/2013 Attending Physician: [name], MD  Discharge Diagnosis:  Principal Problem:   Abscess of left thigh Active Problems:   HSV keratitis   Acute renal failure   Chronic pain syndrome   Testosterone deficiency   Chronic use of opiate drugs therapeutic purposes   Septic shock due to abscess  Discharge Medications:   Medication List    STOP taking these medications       cephALEXin 500 MG capsule  Commonly known as:  KEFLEX     sulfamethoxazole-trimethoprim 800-160 MG per tablet  Commonly known as:  BACTRIM DS      TAKE these medications       atenolol 100 MG tablet  Commonly known as:  TENORMIN  Take 100 mg by mouth daily.     atorvastatin 80 MG tablet  Commonly known as:  LIPITOR  Take 80 mg by mouth daily.     clonazePAM 2 MG tablet  Commonly known as:  KLONOPIN  Take 2 mg by mouth 4 (four) times daily.     diphenhydrAMINE 25 mg capsule  Commonly known as:  BENADRYL  Take 25 mg by mouth daily as needed for allergies.     doxycycline 100 MG capsule  Commonly known as:  VIBRAMYCIN  Take 1 capsule (100 mg total) by mouth 2 (two) times daily.     Ganciclovir 0.15 % Gel  Commonly known as:  ZIRGAN  Place 1 drop into the right eye 2 (two) times daily.     oxycodone 30 MG immediate release tablet  Commonly known as:  ROXICODONE  Take 30 mg by mouth 4 (four) times daily.     oxymorphone 30 MG 12 hr tablet  Commonly known as:  OPANA ER  Take 30 mg by mouth every 12 (twelve) hours.     PENNSAID 1.5 % Soln  Generic drug:  Diclofenac Sodium  Place 8 drops onto the skin 2 (two) times daily. *4 drops onto both knees*     trifluridine 1 % ophthalmic solution  Commonly known as:  VIROPTIC  Place 1 drop into the right eye QID.        Disposition and follow-up:   [name] was discharged from  [facility] in Good condition.  At the hospital follow up visit please address:  1. BMP- Pt creatinine was elevated during admission, there was marked improvement at discharge, please check if Cr has return to normal. Also please check WBC- to ensure complete resolution of infection, that abcess is not reoccuring, redness and induration on Left thigh is resolving. Compliance with doxycycline for 2 weeks.   2.  Complete resolution of eye symptoms that was diagnosed as Herpes Keratitis during admission. And duration of treatment.   Follow-up Appointments:     Follow-up Information   Follow up with [name], [name], MD. Schedule an appointment as soon as possible for a visit in 2 weeks.   Specialty:  Orthopedic Surgery   Contact information:   [address] [place] [address] [phone]       Follow up with [name], MD On 07/19/2013. (At 10.20am)    Specialty:  Internal Medicine   Contact information:   [address] [place] [address] [phone]       Follow up with [name], MD On 07/24/2013. (  At 2.30pm)    Specialty:  Orthopedic Surgery   Contact information:   [address] [place] [address] [phone]       Follow up with [name] On 07/25/2013. (1.15pm)    Contact information:   [facility] [address], [place], [place] [address]      Discharge Instructions: Discharge Orders   Future Orders Complete By Expires   Call MD for:  persistant nausea and vomiting  As directed    Call MD for:  redness, tenderness, or signs of infection (pain, swelling, redness, odor or green/yellow discharge around incision site)  As directed    Call MD for:  temperature >100.4  As directed    Diet - low sodium heart healthy  As directed    Discharge instructions  As directed    Comments:     We will be discharging you to complete 2 weeks of Doxycycline, you will be taking 100mg  twice  a day.  Also please follow the instructions the orthopedic doctors have given you about wound care.  We have scheduled 3 appointments for you. The only appointment we could get for you with [facility] was for the 24th of this month.  Please remember to keep your appointments.  Also remember to use your eye drops. You will be using the Viroptic for 1 week total. Today is your 4th day, we want you to have 3 more days of the viroptic. For the Zirgan till you see Dr Karleen Hampshire, the ophthalmologist.   Increase activity slowly  As directed       Consultations: Treatment Team:  [name], MD  Procedures Performed:  Dg Knee 1-2 Views Left  07/13/2013   CLINICAL DATA:  Abscess, postop  EXAM: LEFT KNEE - 1-2 VIEW  COMPARISON:  None  FINDINGS: Bones appear demineralized.  Components of left knee prosthesis identified in expected positions.  Mild soft tissue swelling at the site.  No acute fracture, dislocation or bone destruction.  No periprosthetic lucency or knee joint effusion seen.  IMPRESSION: Osseous demineralization and left knee prosthesis.  No acute abnormalities.   Electronically Signed   By: Ulyses Southward M.D.   On: 07/13/2013 02:02   US Renal  07/13/2013   CLINICAL DATA:  History BPH.  Question obstructive uropathy.  EXAM: RENAL/URINARY TRACT ULTRASOUND COMPLETE  COMPARISON:  None.  FINDINGS: Right Kidney:  Length: 13 cm. Echogenicity within normal limits. No mass or hydronephrosis visualized.  Left Kidney:  Length: 13.5 cm. Echogenicity within normal limits. No mass or hydronephrosis visualized.  Bladder:  Prevoid urinary bladder 276 cc. No evidence of bladder wall abnormality.  IMPRESSION: No hydronephrosis or other abnormality.   Electronically Signed   By: Tiburcio Pea M.D.   On: 07/13/2013 06:01   Craig Femur Left Wo Contrast  07/12/2013   CLINICAL DATA:  Fluid collection.  Left thigh swelling and redness.  EXAM: Craig OF THE LEFT FEMUR WITHOUT CONTRAST  TECHNIQUE: Multiplanar, multisequence Craig  imaging was performed. No intravenous contrast was administered.  COMPARISON:  None.  FINDINGS: There is a large elongated fluid collection in the proximal left vastus lateralis. Edema radiates distally in the left vastus lateralis and there is subcutaneous edema diffusely around the left thigh, likely representing cellulitis. Fluid collection measures 7 cm AP x 5.6 cm transverse. Craniocaudal extent is 19 cm. Small foci of susceptibility artifact are present in the anterior portions, potentially representing metal or gas. Reactive edema is present in the  rectus femoris muscle. The fluid collection lies deep to the iliotibial band and the origin of the fluid is at the distal extent of the tensor fascia lata muscle.  Posterior compartment appears normal. The femur appears normal without erosion or intra medullary edema.  Surgical clips are noted in the left inguinal region.  IMPRESSION: 1. Large fluid collection in the proximal lateral left thigh consistent with abscess. This is in the proximal left vastus lateralis, with the proximal extent at the inferior margin of the tensor fascia lata muscle. The collection is in both the subcutaneous tissues and vastus lateralis muscle belly. 2. Left thigh cellulitis.   Electronically Signed   By: Andreas Newport M.D.   On: 07/12/2013 11:38   Admission HPI: Chief Complaint: Left thigh swelling.   History of Present Illness: [name] is a 43 y o Male wih PMH of bilateral knee replacement, HTN, BPH and anxiety, Chronic back pain, who presented to the ED today with c/o of Left thigh swelling that started about 5 days ago. Initaily started as a cyst/swelling that gradually became red and Painful . (As per chart review, pt presented to the ED, and stated that the duration of swelling was- 3 weeks, an I and D was done, no purulent drainage) But pt said he went [place], when the Cyst became red and painful, Had an I and D done, and was discharged home  on antibiotics- Keflex and bactrim, which he has reported been taking since 12/14 with no improvement. He came to the ED today because the leg became increasingly painful, erythematous and swollen. He denies any recent trauma, insect bites and has no pets at home. He endorses chills , subjective fever, and aching. He also reports nausea and decreased appetite . He denies chest pain, palpitations, SOB, headache or neck stiffness. He denies diarrhea and reports no dysuria or changes in urinary urgency or frequency, not a known diabetic.  Patient also reports that he noticed being "confused" on Sunday (12/14). He explains that he could not remember where he had put his medications. He says that his wife and sons also noticed that he was acting "forgetful". He reports that this has improved since Sunday, but still endorses feeling confused at times. He denies feeling dizzy or lightheaded. He denies changes in vision or hearing. He reports taking high doses of opiates for the past 8 years for chronic pain for his knees s/p bilateral knee replacement. Patient reports that his Dr. Naoma Diener in [facility] (physical medicine and rehabilitation) prescribes this.  Hospital Course by problem list:  Cellulitis and Abscess with Sepsis- Resolving. Patient presented with left thigh swelling, pain, fever and chills with induration, was also hypotensive. Pt had been placed on Keflex and bactrim 2 days prior, without resolution. On admission, CBC showed a WBC- 25.6. BMP revealed a Cr- 2.98, and BUN- 30. Blood cultures where drawn. Initially pt was started on clindamycin in the ED but this was switched to  IV antibiotics- Vancomycin and Zosyn on the day of admission for broader coverage. Pt was also hypotensive on admission and so was given IVF- 3L and maintained at 166mls/hr.  MRI was done which revealed- Large fluid collection in the proximal lateral left thigh consistent with abscess. Orthopedics was consulted and on 07/12/13  pt had an I&D with drainage of ~1L of purulent foul smelling liquid, drains were initially put in which were removed- after 3 days. Patient reported gradually improvement in  symptoms, and at discharge was able to mobilize and bear weight on affected limb, with marked improvement in redness, swelling and induration. Patient remained afebrile throughout admission, WBC normalized- 9.4 on 07/17/2013, BMP- 07/17/2013- Cr 1.53, BUN- 8.Orthopedics Recommended Discharge home from their  standpoint. Final Blood cultures and cultures from the abscess had no growth. ID was therefore consulted by Phone to determine appropriate antibiotic coverage on discharge, and recs were for Oral Doxycline 100mg  BID for 2 weeks, IV antibiotics were discontinued and pt was discharged to follow up closely with his PCP, Orthopedic surgery and Opthalmology.  HSV Keratitis- Patient complained of itching in his Right eye, that started during admission, and reported a hx of HSV involving same eye in the past. Ophthalmology was consulted, and an assessment of Viral Keratoconjunctivitis , r/o recurrent HSV keratitis, Patient was started on Viroptic- 1gtt od QID for 1 week, and Zirgan- 1 application BID. Follow up was arranged with [facility] on discharge.   ARF- Resolving. BMP on admission revealed a Cr- 2.98, and BUN- 30. No records/baseline to compare, but  As per pt PCP and pt, renal function at baseline was normal. BUN: Cr ratio was <20, but there was a strong index of suspicion that this was pre-renal in aetiology due to sepsis and hypotension. Pt reported that he had a hx of BPH, Impression from Renal USS- no features suggestive of obstrcutive uropathy (No hydronephrosis) and Urinalysis done was essentially without abnormalities. With hydration, renal function showed gradual improvement. On discharge 07/17/2013- Cr- 1.53, BUN- 8. On discharge pt was to follow up with his PCP to ensure continued improvement of renal function.  HLD-  Patient was on Atorvastatin 80 mg prior to admission, was continued on admission and on discharge.    Chronic Knee pain- Status post bilat knee replacement surgery, home meds - Opana- 30mg  BID, Oxycodone- 30mg  Q6h daily for the past 8 years. This was confirmed on admission with his pharmacy and his PCP. Will continue home meds on discharge. Advised to avoid NSAIDS due to renal concerns.   Discharge Vitals:   BP 129/81  Pulse 74  Temp(Src) 97.9 F (36.6 C) (Oral)  Resp 18  Ht 6\' 4"  (1.93 m)  Wt 277 lb 12.5 oz (126 kg)  BMI 33.83 kg/m2  SpO2 96%  Discharge Labs:  Results for orders placed during the hospital encounter of 07/11/13 (from the past 24 hour(s))  BASIC METABOLIC PANEL     Status: Abnormal   Collection Time    07/17/13  5:55 AM      Result Value Range   Sodium 139  135 - 145 mEq/L   Potassium 4.1  3.5 - 5.1 mEq/L   Chloride 103  96 - 112 mEq/L   CO2 29  19 - 32 mEq/L   Glucose, Bld 104 (*) 70 - 99 mg/dL   BUN 8  6 - 23 mg/dL   Creatinine, Ser 1.61 (*) 0.50 - 1.35 mg/dL   Calcium 8.6  8.4 - 09.6 mg/dL   GFR calc non Af Amer 50 (*) >90 mL/min   GFR calc Af Amer 57 (*) >90 mL/min  CBC     Status: Abnormal   Collection Time    07/17/13  5:55 AM      Result Value Range   WBC 9.4  4.0 - 10.5 K/uL   RBC 3.32 (*) 4.22 - 5.81 MIL/uL   Hemoglobin 9.7 (*) 13.0 - 17.0 g/dL   HCT 04.5 (*) 40.9 - 81.1 %  MCV 92.8  78.0 - 100.0 fL   MCH 29.2  26.0 - 34.0 pg   MCHC 31.5  30.0 - 36.0 g/dL   RDW 16.1  09.6 - 04.5 %   Platelets 437 (*) 150 - 400 K/uL    Signed: Kennis Carina, MD 07/17/2013, 2:24 PM   Time Spent on Discharge: 40 minutes Services Ordered on Discharge: None Equipment Ordered on Discharge: None.

## 2013-07-17 NOTE — Progress Notes (Signed)
Subjective: Walking around the room. Not in any distress. Says he feels much better today. Ready to go home. Good appetite.   Objective: Vital signs in last 24 hours: Filed Vitals:   07/15/13 2047 07/16/13 0520 07/16/13 2138 07/17/13 0608  BP: 135/75 130/80 125/69 129/81  Pulse: 85 81 82 74  Temp: 98.1 F (36.7 C) 97.8 F (36.6 C) 98.8 F (37.1 C) 97.9 F (36.6 C)  TempSrc: Oral Oral Oral Oral  Resp: 18 18 18 18   Height:      Weight:      SpO2: 99% 99% 99% 96%   Weight change:   Intake/Output Summary (Last 24 hours) at 07/17/13 1226 Last data filed at 07/17/13 1100  Gross per 24 hour  Intake   1345 ml  Output   1150 ml  Net    195 ml   General appearance: In good spirits, not in any distress.  EYES- Rt eye- not erythematous, no itching. Left Eye- Normal. PERRL. Lungs- Clear to auscultation bilat. Heart:  S1, S2 normal, no murmur, click, rub or gallop Abdomen: soft, non-tender; bowel sounds normal; no masses,  no organomegaly Extremities: Clean dressing on Left Lower extremity- bandage dressing on mid thigh only, around site of incision, since around dressing much improved, no redness ant, resolving induration posteriorly, no drains present, leg appears better. RLE- Normal in appearance, reduced DP pulses distally, but legs are warm, no calf tenderness.  Lab Results: Basic Metabolic Panel:  Recent Labs Lab 07/16/13 0535 07/17/13 0555  NA 143 139  K 4.9 4.1  CL 105 103  CO2 30 29  GLUCOSE 95 104*  BUN 6 8  CREATININE 1.52* 1.53*  CALCIUM 8.6 8.6   Liver Function Tests:  Recent Labs Lab 07/12/13 0519  AST 27  ALT 39  ALKPHOS 150*  BILITOT 0.3  PROT 5.6*  ALBUMIN 1.9*   CBC:  Recent Labs Lab 07/11/13 1149 07/12/13 0519  07/16/13 0535 07/17/13 0555  WBC 25.6* 15.6*  < > 10.7* 9.4  NEUTROABS 21.5* 12.4*  --   --   --   HGB 9.6* 9.6*  < > 9.7* 9.7*  HCT 29.0* 28.3*  < > 31.2* 30.8*  MCV 90.1 87.9  < > 92.6 92.8  PLT 402* 287  < > 405* 437*  <  > = values in this interval not displayed.  Micro Results: Recent Results (from the past 240 hour(s))  CULTURE, BLOOD (ROUTINE X 2)     Status: None   Collection Time    07/11/13  3:30 PM      Result Value Range Status   Specimen Description BLOOD ARM RIGHT   Final   Special Requests BOTTLES DRAWN AEROBIC ONLY 2.5CC   Final   Culture  Setup Time     Final   Value: 07/11/2013 20:22     Performed at [facility]   Culture     Final   Value: NO GROWTH 5 DAYS     Performed at [facility]   Report Status 07/17/2013 FINAL   Final  MRSA PCR SCREENING     Status: None   Collection Time    07/11/13  6:24 PM      Result Value Range Status   MRSA by PCR NEGATIVE  NEGATIVE Final   Comment:            The GeneXpert MRSA Assay (FDA     approved for NASAL specimens     only), is  one component of a     comprehensive MRSA colonization     surveillance program. It is not     intended to diagnose MRSA     infection nor to guide or     monitor treatment for     MRSA infections.  CULTURE, BLOOD (ROUTINE X 2)     Status: None   Collection Time    07/11/13  7:15 PM      Result Value Range Status   Specimen Description BLOOD RIGHT HAND   Final   Special Requests BOTTLES DRAWN AEROBIC ONLY 4CC   Final   Culture  Setup Time     Final   Value: 07/12/2013 01:04     Performed at [facility]   Culture     Final   Value:        BLOOD CULTURE RECEIVED NO GROWTH TO DATE CULTURE WILL BE HELD FOR 5 DAYS BEFORE ISSUING A FINAL NEGATIVE REPORT     Performed at [facility]   Report Status PENDING   Incomplete  ANAEROBIC CULTURE     Status: None   Collection Time    07/12/13  5:12 PM      Result Value Range Status   Specimen Description TISSUE LEFT THIGH   Final   Special Requests NONE   Final   Gram Stain     Final   Value: RARE WBC PRESENT,BOTH PMN AND MONONUCLEAR     NO ORGANISMS SEEN     Performed at [facility]   Culture     Final   Value: NO  ANAEROBES ISOLATED     Performed at [facility]   Report Status 07/17/2013 FINAL   Final  TISSUE CULTURE     Status: None   Collection Time    07/12/13  5:12 PM      Result Value Range Status   Specimen Description TISSUE LEFT THIGH   Final   Special Requests NONE   Final   Gram Stain     Final   Value: RARE WBC PRESENT,BOTH PMN AND MONONUCLEAR     NO ORGANISMS SEEN     Performed at [facility]   Culture     Final   Value: NO GROWTH 3 DAYS     Performed at [facility]   Report Status 07/16/2013 FINAL   Final   Studies/Results: No results found. Medications: I have reviewed the patient's current medications. Scheduled Meds: . atorvastatin  80 mg Oral Daily  . clonazePAM  2 mg Oral QID  . docusate sodium  100 mg Oral BID  . enoxaparin (LOVENOX) injection  60 mg Subcutaneous Q24H  . Ganciclovir  1 application Right Eye BID  . morphine  75 mg Oral Q12H  . oxyCODONE  30 mg Oral Q6H  . piperacillin-tazobactam (ZOSYN)  IV  3.375 g Intravenous Q8H  . senna  2 tablet Oral BID  . trifluridine  1 drop Right Eye QID  . vancomycin  1,000 mg Intravenous Q12H   Continuous Infusions: . sodium chloride 75 mL/hr at 07/16/13 1036   PRN Meds:.ondansetron (ZOFRAN) IV, ondansetron Assessment/Plan:  Cellulitis and Abscess with Sepsis- Resolving. Abcess drained 5 days ago, by Orthopedics, drainage of ~1L of purulent foul smelling liquid. WBC today- 9.4. Been afebrile since admission. - Orthopedics- Recs Appreciated- Discharge home. - Blood cultures- No growth to date, but was on antibiotics-as an out-pt prior to samples been drawn. - Cultures from Abcess-  No growth, no org seen.  - On Zosyn and Vancomycin- Per pharm- 6th day, Vanc trough still within normal limits- 07/15/2013. - Talked to ID over the phone and recs for home antibiotic regimen- @ week course of Doxycycline- 100mg  BID. - Odansetron 4mg  Q6H PRN for nausea. - Will discharge home to continue previous home  meds. - Arrange follow up with Orthopedics  HSV Keratitis- Ophthalmology was consulted, as pt complained of some itching and reported a hx of HSV involving his eyes. Assessment by ophthal- Viral keratoconjunctivitis, and pt was started on Viroptic and Zirgan. Pt is to follow up with [facility] post discharge. - Cont Viroptic-for 1 week and Zirgan.  - Arrange follow up for pt as an out-pt.  ARF- Resolving. No records to compare and get baseline. But pt reports he has never been told he has kidney problems. Cr today- 1.53. On downward trend initially but recent slight bump. But marked improvement from 2.98 On admission. Slight bump possible due to Vancomycin therapy.  Likley due to sepsis. Pt has a hx of( self reported ) BPH, consider CRF, due to obstructive uropathy. As per pt PCP, pt baseline is normal. Renal USS- No hydronephrosis and Urinalysis- No abnormalities. - Follow up with PCP.  HLD- No lipid profile on chart.  - Home meds, will continue on admission. Atorvastatin-80mg  daily.   Chronic Knee pain- Status post bilat knee replacement surgery, on Opena- 30mg  BID, Oxycodone- 30mg  Q6h daily for the past 8 years.  Home meds during this admisssion- MS contin 75mg  BID, oxycodone- 30mg  PO Q6H. - Will continue home meds on discharge.  DVT ppx- Can start Lovenox, as per ortho recs.  Dispo: Disposition is deferred at this time, awaiting improvement of current medical problems.    The patient does have a current PCP [name], MD) and does need an [facility] hospital follow-up appointment after discharge.  The patient does not know have transportation limitations that hinder transportation to clinic appointments.  .Services Needed at time of discharge: Y = Yes, Blank = No PT:   OT:   RN:   Equipment:   Other:     LOS: 6 days   Kennis Carina, MD 07/17/2013, 12:26 PM

## 2013-07-17 NOTE — Progress Notes (Signed)
RN reviewed discharge instructions with patient and family. All questions answered. Patient stated understanding of dressing changes, medications, and follow up appointments.

## 2013-07-17 NOTE — Progress Notes (Signed)
Subjective: 5 Days Post-Op Procedure(s) (LRB): I&D Left Thigh Abscess (Left) Patient reports pain as mild.  Ambulating without difficutly.  Soreness in L thigh is moderate.  Objective: Vital signs in last 24 hours: Temp:  [97.9 F (36.6 C)-98.8 F (37.1 C)] 97.9 F (36.6 C) (12/22 1610) Pulse Rate:  [74-82] 74 (12/22 0608) Resp:  [18] 18 (12/22 0608) BP: (125-129)/(69-81) 129/81 mmHg (12/22 0608) SpO2:  [96 %-99 %] 96 % (12/22 9604)  Intake/Output from previous day: 12/21 0701 - 12/22 0700 In: 1305 [P.O.:480; I.V.:575; IV Piggyback:250] Out: 1200 [Urine:1200] Intake/Output this shift:     Recent Labs  07/16/13 0535 07/17/13 0555  HGB 9.7* 9.7*    Recent Labs  07/16/13 0535 07/17/13 0555  WBC 10.7* 9.4  RBC 3.37* 3.32*  HCT 31.2* 30.8*  PLT 405* 437*    Recent Labs  07/16/13 0535 07/17/13 0555  NA 143 139  K 4.9 4.1  CL 105 103  CO2 30 29  BUN 6 8  CREATININE 1.52* 1.53*  GLUCOSE 95 104*  CALCIUM 8.6 8.6   No results found for this basename: LABPT, INR,  in the last 72 hours  PE:  left thigh with scant serosang drainage fromt he incision.  No fluctuance.  Not particularly tender.  Erythema is unchanged.  Cxs:  No growth to date.  Assessment/Plan: 5 Days Post-Op Procedure(s) (LRB): I&D Left Thigh Abscess (Left) Up with therapy  WBAT.  IV abx per ID recs.  Pt can be discharged home at any time from ortho perspective.  [name] 07/17/2013, 7:48 AM

## 2013-07-17 NOTE — Care Management Note (Signed)
CARE MANAGEMENT NOTE 07/17/2013  Patient:  [name], [name]   Account Number:  [id number]  Date Initiated:  07/17/2013  Documentation initiated by:  Vance Peper  Subjective/Objective Assessment:   57 yr old male admitted with Left thigh abscess.     Action/Plan:   No home health needs identified.   Anticipated DC Date:  07/17/2013   Anticipated DC Plan:  HOME/SELF CARE         Choice offered to / List presented to:             Status of service:  Completed, signed off Medicare Important Message given?   (If response is "NO", the following Medicare IM given date fields will be blank) Date Medicare IM given:   Date Additional Medicare IM given:    Discharge Disposition:    Per UR Regulation:    If discussed at Long Length of Stay Meetings, dates discussed:    Comments:

## 2013-07-18 LAB — CULTURE, BLOOD (ROUTINE X 2): Culture: NO GROWTH

## 2013-07-20 NOTE — Discharge Summary (Signed)
Name: [name] MRN: [id number] DOB: 05-Aug-1955 57 y.o. PCP: [name], MD  Date of Admission: 07/11/2013 11:14 AM Date of Discharge: 07/17/2013 Attending Physician: [name], MD  Discharge Diagnosis: Principal Problem:   Abscess of left thigh Active Problems:   HSV keratitis   Acute renal failure   Chronic pain syndrome   Testosterone deficiency   Chronic use of opiate drugs therapeutic purposes   Septic shock due to abscess  Discharge Medications:   Medication List    STOP taking these medications       cephALEXin 500 MG capsule  Commonly known as:  KEFLEX     sulfamethoxazole-trimethoprim 800-160 MG per tablet  Commonly known as:  BACTRIM DS      TAKE these medications       atenolol 100 MG tablet  Commonly known as:  TENORMIN  Take 100 mg by mouth daily.     atorvastatin 80 MG tablet  Commonly known as:  LIPITOR  Take 80 mg by mouth daily.     clonazePAM 2 MG tablet  Commonly known as:  KLONOPIN  Take 2 mg by mouth 4 (four) times daily.     diphenhydrAMINE 25 mg capsule  Commonly known as:  BENADRYL  Take 25 mg by mouth daily as needed for allergies.     doxycycline 100 MG capsule  Commonly known as:  VIBRAMYCIN  Take 1 capsule (100 mg total) by mouth 2 (two) times daily.     Ganciclovir 0.15 % Gel  Commonly known as:  ZIRGAN  Place 1 drop into the right eye 2 (two) times daily.     oxycodone 30 MG immediate release tablet  Commonly known as:  ROXICODONE  Take 30 mg by mouth 4 (four) times daily.     oxymorphone 30 MG 12 hr tablet  Commonly known as:  OPANA ER  Take 30 mg by mouth every 12 (twelve) hours.     PENNSAID 1.5 % Soln  Generic drug:  Diclofenac Sodium  Place 8 drops onto the skin 2 (two) times daily. *4 drops onto both knees*     trifluridine 1 % ophthalmic solution  Commonly known as:  VIROPTIC  Place 1 drop into the right eye QID.        Disposition and follow-up:   Mr.Amritpal F Hudnall was discharged from [facility] in Good condition.  At the hospital follow up visit please address:  1. BMP- Pt creatinine was elevated during admission, there was marked improvement at discharge, please check if Cr has return to normal. Also please check WBC- to ensure complete resolution of infection, that abcess is not reoccuring, redness and induration on Left thigh is resolving. Compliance with doxycycline for 2 weeks.   2.  Complete resolution of eye symptoms that was diagnosed as Herpes Keratitis during admission. And duration of treatment.   Follow-up Appointments:     Follow-up Information   Follow up with [name], [name], MD. Schedule an appointment as soon as possible for a visit in 2 weeks.   Specialty:  Orthopedic Surgery   Contact information:   [address] [place] [address] [phone]       Follow up with [name], MD On 07/19/2013. (At 10.20am)    Specialty:  Internal Medicine   Contact information:   [address] [place] [address] [phone]       Follow up with [name], MD On 07/24/2013. (  At 2.30pm)    Specialty:  Orthopedic Surgery   Contact information:   [address] [place] [address] [phone]       Follow up with [name] On 07/25/2013. (1.15pm)    Contact information:   [facility] [address], [place], [place] [address]      Discharge Instructions: Discharge Orders   Future Orders Complete By Expires   Call MD for:  persistant nausea and vomiting  As directed    Call MD for:  redness, tenderness, or signs of infection (pain, swelling, redness, odor or green/yellow discharge around incision site)  As directed    Call MD for:  temperature >100.4  As directed    Diet - low sodium heart healthy  As directed    Discharge instructions  As directed    Comments:     We will be discharging you to complete 2 weeks of Doxycycline, you will be taking 100mg  twice a  day.  Also please follow the instructions the orthopedic doctors have given you about wound care.  We have scheduled 3 appointments for you. The only appointment we could get for you with [facility] was for the 24th of this month.  Please remember to keep your appointments.  Also remember to use your eye drops. You will be using the Viroptic for 1 week total. Today is your 4th day, we want you to have 3 more days of the viroptic. For the Zirgan till you see Dr Karleen Hampshire, the ophthalmologist.   Increase activity slowly  As directed       Consultations: Treatment Team: Orthopedic surgery-  [name], MD  Procedures Performed:  Dg Knee 1-2 Views Left  07/13/2013   CLINICAL DATA:  Abscess, postop  EXAM: LEFT KNEE - 1-2 VIEW  COMPARISON:  None  FINDINGS: Bones appear demineralized.  Components of left knee prosthesis identified in expected positions.  Mild soft tissue swelling at the site.  No acute fracture, dislocation or bone destruction.  No periprosthetic lucency or knee joint effusion seen.  IMPRESSION: Osseous demineralization and left knee prosthesis.  No acute abnormalities.   Electronically Signed   By: Ulyses Southward M.D.   On: 07/13/2013 02:02   US Renal  07/13/2013   CLINICAL DATA:  History BPH.  Question obstructive uropathy.  EXAM: RENAL/URINARY TRACT ULTRASOUND COMPLETE  COMPARISON:  None.  FINDINGS: Right Kidney:  Length: 13 cm. Echogenicity within normal limits. No mass or hydronephrosis visualized.  Left Kidney:  Length: 13.5 cm. Echogenicity within normal limits. No mass or hydronephrosis visualized.  Bladder:  Prevoid urinary bladder 276 cc. No evidence of bladder wall abnormality.  IMPRESSION: No hydronephrosis or other abnormality.   Electronically Signed   By: Tiburcio Pea M.D.   On: 07/13/2013 06:01   Mr Femur Left Wo Contrast  07/12/2013   CLINICAL DATA:  Fluid collection.  Left thigh swelling and redness.  EXAM: MR OF THE LEFT FEMUR WITHOUT CONTRAST  TECHNIQUE: Multiplanar,  multisequence MR imaging was performed. No intravenous contrast was administered.  COMPARISON:  None.  FINDINGS: There is a large elongated fluid collection in the proximal left vastus lateralis. Edema radiates distally in the left vastus lateralis and there is subcutaneous edema diffusely around the left thigh, likely representing cellulitis. Fluid collection measures 7 cm AP x 5.6 cm transverse. Craniocaudal extent is 19 cm. Small foci of susceptibility artifact are present in the anterior portions, potentially representing metal or gas. Reactive edema is present  in the rectus femoris muscle. The fluid collection lies deep to the iliotibial band and the origin of the fluid is at the distal extent of the tensor fascia lata muscle.  Posterior compartment appears normal. The femur appears normal without erosion or intra medullary edema.  Surgical clips are noted in the left inguinal region.  IMPRESSION: 1. Large fluid collection in the proximal lateral left thigh consistent with abscess. This is in the proximal left vastus lateralis, with the proximal extent at the inferior margin of the tensor fascia lata muscle. The collection is in both the subcutaneous tissues and vastus lateralis muscle belly. 2. Left thigh cellulitis.   Electronically Signed   By: Andreas Newport M.D.   On: 07/12/2013 11:38   Admission HPI: Chief Complaint: Left thigh swelling.   History of Present Illness: Mr Quindarrius Joplin is a 50 y o Male wih PMH of bilateral knee replacement, HTN, BPH and anxiety, Chronic back pain, who presented to the ED today with c/o of Left thigh swelling that started about 5 days ago. Initaily started as a cyst/swelling that gradually became red and Painful . (As per chart review, pt presented to the ED, and stated that the duration of swelling was- 3 weeks, an I and D was done, no purulent drainage) But pt said [place], when the Cyst became red and painful, Had an I and D done, and was  discharged home on antibiotics- Keflex and bactrim, which he has reported been taking since 12/14 with no improvement. He came to the ED today because the leg became increasingly painful, erythematous and swollen. He denies any recent trauma, insect bites and has no pets at home. He endorses chills , subjective fever, and aching. He also reports nausea and decreased appetite . He denies chest pain, palpitations, SOB, headache or neck stiffness. He denies diarrhea and reports no dysuria or changes in urinary urgency or frequency, not a known diabetic.  Patient also reports that he noticed being "confused" on Sunday (12/14). He explains that he could not remember where he had put his medications. He says that his wife and sons also noticed that he was acting "forgetful". He reports that this has improved since Sunday, but still endorses feeling confused at times. He denies feeling dizzy or lightheaded. He denies changes in vision or hearing. He reports taking high doses of opiates for the past 8 years for chronic pain for his knees s/p bilateral knee replacement. Patient reports that his Dr. Naoma Diener in [facility] (physical medicine and rehabilitation) prescribes this.  Hospital Course by problem list:  Cellulitis and Abscess with Sepsis- Resolving. Patient presented with left thigh swelling, pain, fever and chills with induration, was also hypotensive. Pt had been placed on Keflex and bactrim 2 days prior, without resolution. On admission, CBC showed a WBC- 25.6. BMP revealed a Cr- 2.98, and BUN- 30. Blood cultures where drawn. Initially pt was started on clindamycin in the ED but this was switched to  IV antibiotics- Vancomycin and Zosyn on the day of admission for broader coverage. Pt was also hypotensive on admission and so was given IVF- 3L and maintained at 157mls/hr.  MRI was done which revealed- Large fluid collection in the proximal lateral left thigh consistent with abscess. Orthopedics was consulted  and on 07/12/13 pt had an I&D with drainage of ~1L of purulent foul smelling liquid, drains were initially put in which were removed- after 3 days. Patient reported gradually  improvement in symptoms, and at discharge was able to mobilize and bear weight on affected limb, with marked improvement in redness, swelling and induration. Patient remained afebrile throughout admission, WBC normalized- 9.4 on 07/17/2013, BMP- 07/17/2013- Cr 1.53, BUN- 8.Orthopedics Recommended Discharge home from their  standpoint. Final Blood cultures and cultures from the abscess had no growth. ID was therefore consulted by Phone to determine appropriate antibiotic coverage on discharge, and recs were for Oral Doxycline 100mg  BID for 2 weeks, IV antibiotics were discontinued and pt was discharged to follow up closely with his PCP, Orthopedic surgery and Opthalmology.  HSV Keratitis- Patient complained of itching in his Right eye, that started during admission, and reported a hx of HSV involving same eye in the past. Ophthalmology was consulted, and an assessment of Viral Keratoconjunctivitis , r/o recurrent HSV keratitis, Patient was started on Viroptic- 1gtt od QID for 1 week, and Zirgan- 1 application BID. Follow up was arranged with [facility] on discharge.   ARF- Resolving. BMP on admission revealed a Cr- 2.98, and BUN- 30. No records/baseline to compare, but  As per pt PCP and pt, renal function at baseline was normal. BUN: Cr ratio was <20, but there was a strong index of suspicion that this was pre-renal in aetiology due to sepsis and hypotension. Pt reported that he had a hx of BPH, Impression from Renal USS- no features suggestive of obstrcutive uropathy (No hydronephrosis) and Urinalysis done was essentially without abnormalities. With hydration, renal function showed gradual improvement. On discharge 07/17/2013- Cr- 1.53, BUN- 8. On discharge pt was to follow up with his PCP to ensure continued improvement of renal  function.  HLD- Patient was on Atorvastatin 80 mg prior to admission, was continued on admission and on discharge.    Chronic Knee pain- Status post bilat knee replacement surgery, home meds - Opana- 30mg  BID, Oxycodone- 30mg  Q6h daily for the past 8 years. This was confirmed on admission with his pharmacy and his PCP. Will continue home meds on discharge. Advised to avoid NSAIDS due to renal concerns.   Discharge Vitals:   BP 129/81  Pulse 74  Temp(Src) 97.9 F (36.6 C) (Oral)  Resp 18  Ht 6\' 4"  (1.93 m)  Wt 277 lb 12.5 oz (126 kg)  BMI 33.83 kg/m2  SpO2 96%  Discharge Labs:  Results for orders placed during the hospital encounter of 07/11/13 (from the past 24 hour(s))  BASIC METABOLIC PANEL     Status: Abnormal   Collection Time    07/17/13  5:55 AM      Result Value Range   Sodium 139  135 - 145 mEq/L   Potassium 4.1  3.5 - 5.1 mEq/L   Chloride 103  96 - 112 mEq/L   CO2 29  19 - 32 mEq/L   Glucose, Bld 104 (*) 70 - 99 mg/dL   BUN 8  6 - 23 mg/dL   Creatinine, Ser 1.61 (*) 0.50 - 1.35 mg/dL   Calcium 8.6  8.4 - 09.6 mg/dL   GFR calc non Af Amer 50 (*) >90 mL/min   GFR calc Af Amer 57 (*) >90 mL/min  CBC     Status: Abnormal   Collection Time    07/17/13  5:55 AM      Result Value Range   WBC 9.4  4.0 - 10.5 K/uL   RBC 3.32 (*) 4.22 - 5.81 MIL/uL   Hemoglobin 9.7 (*) 13.0 - 17.0 g/dL   HCT 04.5 (*) 40.9 -  52.0 %   MCV 92.8  78.0 - 100.0 fL   MCH 29.2  26.0 - 34.0 pg   MCHC 31.5  30.0 - 36.0 g/dL   RDW 16.1  09.6 - 04.5 %   Platelets 437 (*) 150 - 400 K/uL    Signed: Kennis Carina, MD 07/17/2013, 2:24 PM   Time Spent on Discharge: 40 minutes Services Ordered on Discharge: None Equipment Ordered on Discharge: None.

## 2013-11-15 ENCOUNTER — Emergency Department (HOSPITAL_COMMUNITY): Payer: 59

## 2013-11-15 ENCOUNTER — Encounter (HOSPITAL_COMMUNITY): Payer: Self-pay | Admitting: Emergency Medicine

## 2013-11-15 ENCOUNTER — Observation Stay (HOSPITAL_COMMUNITY)
Admission: EM | Admit: 2013-11-15 | Discharge: 2013-11-17 | Disposition: A | Discharge status: Home / Self Care | Payer: 59 | Attending: Internal Medicine | Admitting: Internal Medicine

## 2013-11-15 DIAGNOSIS — R0602 Shortness of breath: Secondary | ICD-10-CM | POA: Diagnosis not present

## 2013-11-15 DIAGNOSIS — Z79899 Other long term (current) drug therapy: Secondary | ICD-10-CM | POA: Insufficient documentation

## 2013-11-15 DIAGNOSIS — R0789 Other chest pain: Principal | ICD-10-CM | POA: Insufficient documentation

## 2013-11-15 DIAGNOSIS — E785 Hyperlipidemia, unspecified: Secondary | ICD-10-CM | POA: Insufficient documentation

## 2013-11-15 DIAGNOSIS — I10 Essential (primary) hypertension: Secondary | ICD-10-CM | POA: Diagnosis not present

## 2013-11-15 DIAGNOSIS — Z79891 Long term (current) use of opiate analgesic: Secondary | ICD-10-CM

## 2013-11-15 DIAGNOSIS — G894 Chronic pain syndrome: Secondary | ICD-10-CM | POA: Diagnosis present

## 2013-11-15 DIAGNOSIS — G8929 Other chronic pain: Secondary | ICD-10-CM | POA: Diagnosis not present

## 2013-11-15 DIAGNOSIS — R209 Unspecified disturbances of skin sensation: Secondary | ICD-10-CM | POA: Insufficient documentation

## 2013-11-15 DIAGNOSIS — R079 Chest pain, unspecified: Secondary | ICD-10-CM | POA: Diagnosis present

## 2013-11-15 DIAGNOSIS — Z8719 Personal history of other diseases of the digestive system: Secondary | ICD-10-CM

## 2013-11-15 HISTORY — DX: Hyperlipidemia, unspecified: E78.5

## 2013-11-15 HISTORY — DX: Peptic ulcer, site unspecified, unspecified as acute or chronic, without hemorrhage or perforation: K27.9

## 2013-11-15 HISTORY — DX: Pain in unspecified knee: M25.569

## 2013-11-15 HISTORY — DX: Other chronic pain: G89.29

## 2013-11-15 LAB — COMPREHENSIVE METABOLIC PANEL
ALBUMIN: 4.1 g/dL (ref 3.5–5.2)
ALT: 20 U/L (ref 0–53)
AST: 18 U/L (ref 0–37)
Alkaline Phosphatase: 79 U/L (ref 39–117)
BUN: 13 mg/dL (ref 6–23)
CALCIUM: 9.6 mg/dL (ref 8.4–10.5)
CO2: 25 mEq/L (ref 19–32)
CREATININE: 1.08 mg/dL (ref 0.50–1.35)
Chloride: 99 mEq/L (ref 96–112)
GFR calc Af Amer: 87 mL/min — ABNORMAL LOW (ref >90)
GFR calc non Af Amer: 75 mL/min — ABNORMAL LOW (ref >90)
Glucose, Bld: 101 mg/dL — ABNORMAL HIGH (ref 70–99)
Potassium: 4.3 mEq/L (ref 3.7–5.3)
Sodium: 140 mEq/L (ref 137–147)
Total Bilirubin: 0.2 mg/dL — ABNORMAL LOW (ref 0.3–1.2)
Total Protein: 7.7 g/dL (ref 6.0–8.3)

## 2013-11-15 LAB — CBC WITH DIFFERENTIAL/PLATELET
BASOS ABS: 0 10*3/uL (ref 0.0–0.1)
BASOS PCT: 1 % (ref 0–1)
EOS PCT: 2 % (ref 0–5)
Eosinophils Absolute: 0.2 10*3/uL (ref 0.0–0.7)
HEMATOCRIT: 46.5 % (ref 39.0–52.0)
Hemoglobin: 15.8 g/dL (ref 13.0–17.0)
Lymphocytes Relative: 36 % (ref 12–46)
Lymphs Abs: 3.1 10*3/uL (ref 0.7–4.0)
MCH: 30.2 pg (ref 26.0–34.0)
MCHC: 34 g/dL (ref 30.0–36.0)
MCV: 88.7 fL (ref 78.0–100.0)
MONO ABS: 0.8 10*3/uL (ref 0.1–1.0)
Monocytes Relative: 9 % (ref 3–12)
Neutro Abs: 4.5 10*3/uL (ref 1.7–7.7)
Neutrophils Relative %: 52 % (ref 43–77)
Platelets: 249 10*3/uL (ref 150–400)
RBC: 5.24 MIL/uL (ref 4.22–5.81)
RDW: 13.7 % (ref 11.5–15.5)
WBC: 8.6 10*3/uL (ref 4.0–10.5)

## 2013-11-15 LAB — I-STAT TROPONIN, ED: Troponin i, poc: 0 ng/mL (ref 0.00–0.08)

## 2013-11-15 LAB — D-DIMER, QUANTITATIVE (NOT AT ARMC): D-Dimer, Quant: <0.27 ug/mL-FEU (ref 0.00–0.48)

## 2013-11-15 MED ORDER — NITROGLYCERIN 2 % TD OINT
0.5000 [in_us] | TOPICAL_OINTMENT | Freq: Four times a day (QID) | TRANSDERMAL | Status: DC
Start: 2013-11-15 — End: 2013-11-16
  Administered 2013-11-15 – 2013-11-16 (×2): 0.5 [in_us] via TOPICAL
  Filled 2013-11-15 (×9): qty 30

## 2013-11-15 MED ORDER — CLONAZEPAM 1 MG PO TABS
2.0000 mg | ORAL_TABLET | Freq: Four times a day (QID) | ORAL | Status: DC
  Administered 2013-11-15 – 2013-11-17 (×7): 2 mg via ORAL
  Filled 2013-11-15 (×4): qty 2
  Filled 2013-11-15: qty 4
  Filled 2013-11-15 (×2): qty 2

## 2013-11-15 MED ORDER — ENOXAPARIN SODIUM 40 MG/0.4ML ~~LOC~~ SOLN
40.0000 mg | Freq: Every day | SUBCUTANEOUS | Status: DC
Start: 2013-11-16 — End: 2013-11-17
  Administered 2013-11-16 – 2013-11-17 (×2): 40 mg via SUBCUTANEOUS
  Filled 2013-11-15 (×2): qty 0.4

## 2013-11-15 MED ORDER — DIPHENHYDRAMINE HCL 25 MG PO CAPS: 25.0000 mg | ORAL_CAPSULE | Freq: Every day | ORAL | Status: DC | PRN

## 2013-11-15 MED ORDER — ATORVASTATIN CALCIUM 80 MG PO TABS
80.0000 mg | ORAL_TABLET | Freq: Every day | ORAL | Status: DC
  Administered 2013-11-15 – 2013-11-17 (×3): 80 mg via ORAL
  Filled 2013-11-15 (×5): qty 1

## 2013-11-15 MED ORDER — MORPHINE SULFATE ER 15 MG PO TBCR
90.0000 mg | EXTENDED_RELEASE_TABLET | Freq: Two times a day (BID) | ORAL | Status: DC
  Administered 2013-11-15 – 2013-11-17 (×4): 90 mg via ORAL
  Filled 2013-11-15: qty 2
  Filled 2013-11-15 (×4): qty 1
  Filled 2013-11-15 (×3): qty 2

## 2013-11-15 MED ORDER — PANTOPRAZOLE SODIUM 40 MG IV SOLR
40.0000 mg | Freq: Two times a day (BID) | INTRAVENOUS | Status: DC
  Administered 2013-11-15 – 2013-11-16 (×2): 40 mg via INTRAVENOUS
  Filled 2013-11-15 (×3): qty 40

## 2013-11-15 MED ORDER — SODIUM CHLORIDE 0.9 % IV BOLUS (SEPSIS)
500.0000 mL | Freq: Once | INTRAVENOUS | Status: AC
  Administered 2013-11-15: 500 mL via INTRAVENOUS

## 2013-11-15 MED ORDER — ATENOLOL 100 MG PO TABS
100.0000 mg | ORAL_TABLET | Freq: Every day | ORAL | Status: DC
  Administered 2013-11-15 – 2013-11-17 (×3): 100 mg via ORAL
  Filled 2013-11-15 (×4): qty 1

## 2013-11-15 MED ORDER — ASPIRIN 325 MG PO TABS
325.0000 mg | ORAL_TABLET | ORAL | Status: AC
  Administered 2013-11-15: 325 mg via ORAL
  Filled 2013-11-15: qty 1

## 2013-11-15 MED ORDER — NITROGLYCERIN 0.4 MG SL SUBL
0.4000 mg | SUBLINGUAL_TABLET | SUBLINGUAL | Status: AC | PRN
  Administered 2013-11-15 (×3): 0.4 mg via SUBLINGUAL

## 2013-11-15 MED ORDER — ASPIRIN EC 81 MG PO TBEC
81.0000 mg | DELAYED_RELEASE_TABLET | Freq: Every day | ORAL | Status: DC
  Administered 2013-11-16 – 2013-11-17 (×2): 81 mg via ORAL
  Filled 2013-11-15 (×3): qty 1

## 2013-11-15 MED ORDER — ASPIRIN EC 325 MG PO TBEC
325.0000 mg | DELAYED_RELEASE_TABLET | Freq: Once | ORAL | Status: DC
  Filled 2013-11-15: qty 1

## 2013-11-15 MED ORDER — OXYCODONE HCL 5 MG PO TABS
30.0000 mg | ORAL_TABLET | Freq: Four times a day (QID) | ORAL | Status: DC | PRN
  Administered 2013-11-15 – 2013-11-17 (×7): 30 mg via ORAL
  Filled 2013-11-15 (×7): qty 6

## 2013-11-15 MED ORDER — MORPHINE SULFATE 2 MG/ML IJ SOLN
2.0000 mg | INTRAMUSCULAR | Status: DC | PRN
  Administered 2013-11-16 – 2013-11-17 (×5): 2 mg via INTRAVENOUS
  Filled 2013-11-15 (×5): qty 1

## 2013-11-15 MED ORDER — GI COCKTAIL ~~LOC~~: 30.0000 mL | Freq: Four times a day (QID) | ORAL | Status: DC | PRN

## 2013-11-15 MED ORDER — NITROGLYCERIN 0.3 MG SL SUBL
0.3000 mg | SUBLINGUAL_TABLET | SUBLINGUAL | Status: DC | PRN
  Filled 2013-11-15: qty 100

## 2013-11-15 MED ORDER — HYDROMORPHONE HCL PF 1 MG/ML IJ SOLN
1.0000 mg | Freq: Once | INTRAMUSCULAR | Status: AC
  Administered 2013-11-15: 1 mg via INTRAVENOUS
  Filled 2013-11-15: qty 1

## 2013-11-15 NOTE — ED Notes (Signed)
Pt presents to department for evaluation of midsternal non radiating chest pain and SOB. Onset today. 6/10 pain upon arrival, increases with deep breathing and exertion. Pt is conscious alert and oriented x4. Skin warm and dry.

## 2013-11-15 NOTE — ED Notes (Signed)
Family at bedside. 

## 2013-11-15 NOTE — H&P (Signed)
[facility] History and Physical  Patient: [name]  JWJ:[id number]  DOB: [birth year]  DOS: the patient was seen and examined on 11/15/2013 PCP: [name], MD  Chief Complaint: Chest pain  HPI: [name] is a 58 y.o. male with Past medical history of hypertension, chronic knee pain on chronic high-dose opioids, dyslipidemia. The patient presented with complaints of chest pain. Started around noon, he was sitting at his computer, he started having substernal chest heaviness nonradiating. After the chest pain when he was climbing up and down the stairs he had shortness of breath. He denies any palpitation, dizziness, fever, chills, runny nose, congestion, leg swelling, orthopnea, PND, nausea, vomiting, abdominal pain. He denies any prior symptoms on exertion and mentions she can climb up and down the stairs without any shortness of breath. He is compliant with his medications. He has history of duodenal/pyloric ulcer diagnosed 15 years ago not on any PPI at present. He has history of chest pain, followed by a normal cardiac catheterization in 2004, at that point there was suspicion for gastrointestinal disease or this is vasospasm as a cause. He denies any recent upper respiratory infections. He mentions his chest pain worsens with sitting forward and taking a deep breath as well as on pressing on chest.  The patient is coming from home. And at her baseline Independent for most of his  ADL.  Review of Systems: as mentioned in the history of present illness.  A Comprehensive review of the other systems is negative.  Past Medical History  Diagnosis Date  . Hypertension   . Chronic knee pain   . Hyperlipemia    Past Surgical History  Procedure Laterality Date  . I&d extremity Left 07/12/2013    Procedure: I&D Left Thigh Abscess;  Surgeon: [name], MD;  Location: [facility] OR;  Service: Orthopedics;  Laterality: Left;  . Replacement total knee bilateral     Social History:   reports that he has never smoked. He does not have any smokeless tobacco history on file. He reports that he does not drink alcohol or use illicit drugs.  Allergies  Allergen Reactions  . Imitrex [Sumatriptan]     Chest pain, 2004    Family History  Problem Relation Age of Onset  . Heart attack Paternal Grandmother 41  . Heart disease Mother 19    arrythmia  . Stroke Paternal Grandfather   . Stroke Maternal Grandfather     Prior to Admission medications   Medication Sig Start Date End Date Taking? Authorizing Provider  atenolol (TENORMIN) 100 MG tablet Take 100 mg by mouth daily.   Yes Historical Provider, MD  atorvastatin (LIPITOR) 80 MG tablet Take 80 mg by mouth daily.   Yes Historical Provider, MD  clonazePAM (KLONOPIN) 2 MG tablet Take 2 mg by mouth 4 (four) times daily.   Yes Historical Provider, MD  Diclofenac Sodium (PENNSAID) 1.5 % SOLN Place 8 drops onto the skin 2 (two) times daily. *4 drops onto both knees*   Yes Historical Provider, MD  diphenhydrAMINE (BENADRYL) 25 mg capsule Take 25 mg by mouth daily as needed for allergies.   Yes Historical Provider, MD  oxycodone (ROXICODONE) 30 MG immediate release tablet Take 30 mg by mouth 4 (four) times daily.    Yes Historical Provider, MD  oxymorphone (OPANA ER) 30 MG 12 hr tablet Take 30 mg by mouth every 12 (twelve) hours.   Yes Historical Provider, MD    Physical Exam: Filed Vitals:   11/15/13  1840 11/15/13 1845 11/15/13 1930 11/15/13 1945  BP: 126/87 134/86 125/76 128/79  Pulse: 80 84 68 69  Resp: 20 16 11 15   SpO2: 97% 100% 97% 99%    General: Alert, Awake and Oriented to Time, Place and Person. Appear in mild distress Eyes: PERRL ENT: Oral Mucosa clear moist. Neck: No  JVD Cardiovascular: S1 and S2 Present, no  Murmur, Peripheral Pulses Present Respiratory: Bilateral Air entry equal and Decreased, Clear to Auscultation,  No  Crackles,no  wheezes Abdomen: Bowel Sound Present, Soft and mild epigastric  tenderness Skin: No  Rash Extremities: No  Pedal edema, no  calf tenderness Neurologic: Grossly Unremarkable.  Labs on Admission:  CBC:  Recent Labs Lab 11/15/13 1749  WBC 8.6  NEUTROABS 4.5  HGB 15.8  HCT 46.5  MCV 88.7  PLT 249    CMP     Component Value Date/Time   NA 140 11/15/2013 1749   K 4.3 11/15/2013 1749   CL 99 11/15/2013 1749   CO2 25 11/15/2013 1749   GLUCOSE 101* 11/15/2013 1749   BUN 13 11/15/2013 1749   CREATININE 1.08 11/15/2013 1749   CALCIUM 9.6 11/15/2013 1749   PROT 7.7 11/15/2013 1749   ALBUMIN 4.1 11/15/2013 1749   AST 18 11/15/2013 1749   ALT 20 11/15/2013 1749   ALKPHOS 79 11/15/2013 1749   BILITOT 0.2* 11/15/2013 1749   GFRNONAA 75* 11/15/2013 1749   GFRAA 87* 11/15/2013 1749    No results found for this basename: LIPASE, AMYLASE,  in the last 168 hours No results found for this basename: AMMONIA,  in the last 168 hours  No results found for this basename: CKTOTAL, CKMB, CKMBINDEX, TROPONINI,  in the last 168 hours BNP (last 3 results) No results found for this basename: PROBNP,  in the last 8760 hours  Radiological Exams on Admission: Dg Chest 2 View  11/15/2013   CLINICAL DATA:  Mid chest pain.  Shortness of breath for 6 hr.  EXAM: CHEST  2 VIEW  COMPARISON:  None.  FINDINGS: Cardiopericardial silhouette within normal limits. Mediastinal contours normal. Trachea midline. No airspace disease or effusion.  IMPRESSION: No active cardiopulmonary disease.   Electronically Signed   By: Andreas NewportGeoffrey  Lamke M.D.   On: 11/15/2013 18:33    EKG: Independently reviewed. normal sinus rhythm, nonspecific ST and T waves changes.  Assessment/Plan Principal Problem:   Chest pain Active Problems:   Chronic pain syndrome   History of duodenal ulcer   1. Chest pain The patient presents with substernal chest pain with typical and atypical features.  his initial EKG and troponin Does not show any signs of acute ischemia. his chest x-ray is not showing any acute  abnormality, he does not have any respiratory symptoms. his d-dimer negative, and he is considered a low risk for VTE, therefore no indication further workup. At present we will admit the patient to the hospital for observation. I will obtain serial troponin every 6 hours, monitor on telemetry, get a limited echocardiogram in the morning to rule out ACS.  Continue with beta blocker, statin, sublingual nitroglycerin. Add aspirin and nitroglycerin paste. Check lipase. Add protonix and GI cocktail.   2.chronic pain syndrome Continue home dose of norcotics.  DVT Prophylaxis: subcutaneous Heparin Nutrition: cardiac diet, npo after midnight  Code Status: full  Family Communication: wife was present at bedside, opportunity was given to ask question and all questions were answered satisfactorily at the time of interview. Disposition: Admitted to observation in telemetry unit.  Author: Lynden OxfordPranav Jordanna Hendrie, MD [facility] Pager: [phone] 11/15/2013, 9:35 PM    If 7PM-7AM, please contact night-coverage [email or web address] Password [email or web address]

## 2013-11-15 NOTE — ED Provider Notes (Signed)
CSN: [id number]     Arrival date & time 11/15/13  1725 History   First MD Initiated Contact with Patient 11/15/13 1754     Chief Complaint  Patient presents with  . Chest Pain  . Shortness of Breath     (Consider location/radiation/quality/duration/timing/severity/associated sxs/prior Treatment) Patient is a 58 y.o. male presenting with chest pain. The history is provided by the patient.  Chest Pain Pain location:  Substernal area Pain quality: pressure   Pain radiates to:  Does not radiate Pain radiates to the back: no   Pain severity:  Moderate Onset quality:  Gradual Duration:  6 hours Timing:  Constant Progression:  Improving Chronicity:  New Context: breathing and at rest   Relieved by:  Nothing Worsened by:  Nothing tried Ineffective treatments: oxycodone. Associated symptoms: shortness of breath   Associated symptoms: no abdominal pain, no cough, no fever, no headache, no nausea, no numbness and not vomiting     Past Medical History  Diagnosis Date  . Hypertension   . Chronic knee pain   . Hyperlipemia    Past Surgical History  Procedure Laterality Date  . I&d extremity Left 07/12/2013    Procedure: I&D Left Thigh Abscess;  Surgeon: Toni ArthursJohn [name], MD;  [facility];  Service: Orthopedics;  Laterality: Left;  . Replacement total knee bilateral     History reviewed. No pertinent family history. History  Substance Use Topics  . Smoking status: Never Smoker   . Smokeless tobacco: Not on file  . Alcohol Use: No    Review of Systems  Constitutional: Negative for fever.  HENT: Negative for drooling and rhinorrhea.   Eyes: Negative for pain.  Respiratory: Positive for shortness of breath. Negative for cough.   Cardiovascular: Positive for chest pain. Negative for leg swelling.  Gastrointestinal: Negative for nausea, vomiting, abdominal pain and diarrhea.  Genitourinary: Negative for dysuria and hematuria.  Musculoskeletal: Negative for gait problem and neck  pain.  Skin: Negative for color change.  Neurological: Negative for numbness and headaches.       Tingling of left hand  Hematological: Negative for adenopathy.  Psychiatric/Behavioral: Negative for behavioral problems.  All other systems reviewed and are negative.     Allergies  Review of patient's allergies indicates no known allergies.  Home Medications   Prior to Admission medications   Medication Sig Start Date End Date Taking? Authorizing Provider  atenolol (TENORMIN) 100 MG tablet Take 100 mg by mouth daily.    Historical Provider, MD  atorvastatin (LIPITOR) 80 MG tablet Take 80 mg by mouth daily.    Historical Provider, MD  clonazePAM (KLONOPIN) 2 MG tablet Take 2 mg by mouth 4 (four) times daily.    Historical Provider, MD  Diclofenac Sodium (PENNSAID) 1.5 % SOLN Place 8 drops onto the skin 2 (two) times daily. *4 drops onto both knees*    Historical Provider, MD  diphenhydrAMINE (BENADRYL) 25 mg capsule Take 25 mg by mouth daily as needed for allergies.    Historical Provider, MD  doxycycline (VIBRAMYCIN) 100 MG capsule Take 1 capsule (100 mg total) by mouth 2 (two) times daily. 07/17/13   Ejiroghene Mariea ClontsEmokpae, MD  Ganciclovir (ZIRGAN) 0.15 % GEL Place 1 drop into the right eye 2 (two) times daily. 07/17/13   Ejiroghene Mariea ClontsEmokpae, MD  oxycodone (ROXICODONE) 30 MG immediate release tablet Take 30 mg by mouth 4 (four) times daily.     Historical Provider, MD  oxymorphone (OPANA ER) 30 MG 12 hr tablet Take 30  mg by mouth every 12 (twelve) hours.    Historical Provider, MD  trifluridine (VIROPTIC) 1 % ophthalmic solution Place 1 drop into the right eye QID. 07/17/13   Kennis Carina, MD   There were no vitals taken for this visit. Physical Exam  Nursing note and vitals reviewed. Constitutional: He is oriented to person, place, and time. He appears well-developed and well-nourished.  HENT:  Head: Normocephalic and atraumatic.  Right Ear: External ear normal.  Left Ear:  External ear normal.  Nose: Nose normal.  Mouth/Throat: Oropharynx is clear and moist. No oropharyngeal exudate.  Eyes: Conjunctivae and EOM are normal. Pupils are equal, round, and reactive to light.  Neck: Normal range of motion. Neck supple.  Cardiovascular: Normal rate, regular rhythm, normal heart sounds and intact distal pulses.  Exam reveals no gallop and no friction rub.   No murmur heard. Pulmonary/Chest: Effort normal and breath sounds normal. No respiratory distress. He has no wheezes.  Abdominal: Soft. Bowel sounds are normal. He exhibits no distension. There is no tenderness. There is no rebound and no guarding.  Musculoskeletal: Normal range of motion. He exhibits no edema and no tenderness.  Neurological: He is alert and oriented to person, place, and time.  Skin: Skin is warm and dry.  Psychiatric: He has a normal mood and affect. His behavior is normal.    ED Course  Procedures (including critical care time) Labs Review Labs Reviewed  COMPREHENSIVE METABOLIC PANEL - Abnormal; Notable for the following:    Glucose, Bld 101 (*)    Total Bilirubin 0.2 (*)    GFR calc non Af Amer 75 (*)    GFR calc Af Amer 87 (*)    All other components within normal limits  LIPID PANEL - Abnormal; Notable for the following:    HDL 36 (*)    All other components within normal limits  COMPREHENSIVE METABOLIC PANEL - Abnormal; Notable for the following:    GFR calc non Af Amer 74 (*)    GFR calc Af Amer 86 (*)    All other components within normal limits  CBC WITH DIFFERENTIAL  D-DIMER, QUANTITATIVE  TROPONIN I  TROPONIN I  LIPASE, BLOOD  I-STAT TROPOININ, ED    Imaging Review Dg Chest 2 View  11/15/2013   CLINICAL DATA:  Mid chest pain.  Shortness of breath for 6 hr.  EXAM: CHEST  2 VIEW  COMPARISON:  None.  FINDINGS: Cardiopericardial silhouette within normal limits. Mediastinal contours normal. Trachea midline. No airspace disease or effusion.  IMPRESSION: No active  cardiopulmonary disease.   Electronically Signed   By: Andreas Newport M.D.   On: 11/15/2013 18:33     EKG Interpretation   Date/Time:  Wednesday November 15 2013 17:29:33 EDT Ventricular Rate:  84 PR Interval:  174 QRS Duration: 96 QT Interval:  352 QTC Calculation: 415 R Axis:   -25 Text Interpretation:  Normal sinus rhythm Incomplete right bundle branch  block Borderline ECG No significant change since last tracing Confirmed by  SHELDON  MD, Leonette Most [id number]) on 11/15/2013 5:34:16 PM      MDM   Final diagnoses:  Chest pain    6:25 PM 58 y.o. male who presents with chest pain and shortness of breath which began at rest around noon today. The patient notes that his symptoms have persisted since then. He notes mild worsening of pain with deep inspiration. His pain at a max was 8/10 it is currently 6/10. He is taken oxycodone at home  with minimal relief. He is afebrile and vital signs are unremarkable here. He does have a history of a blood clot in the LE in the past and was on Lovenox. Screening lab work thus far unremarkable. Will add on d-dimer. Will give nitroglycerin and aspirin.  Pain improved w/ nitro. Pt feeling better, but sx suspicious. Heart score of 4. Will admit to hospitalist.     Junius ArgyleForrest S Darcee Dekker, MD 11/16/13 2028

## 2013-11-16 ENCOUNTER — Encounter (HOSPITAL_COMMUNITY): Payer: Self-pay | Admitting: Cardiology

## 2013-11-16 DIAGNOSIS — Z79899 Other long term (current) drug therapy: Secondary | ICD-10-CM

## 2013-11-16 LAB — COMPREHENSIVE METABOLIC PANEL
ALT: 16 U/L (ref 0–53)
AST: 15 U/L (ref 0–37)
Albumin: 3.5 g/dL (ref 3.5–5.2)
Alkaline Phosphatase: 67 U/L (ref 39–117)
BUN: 13 mg/dL (ref 6–23)
CALCIUM: 9.1 mg/dL (ref 8.4–10.5)
CO2: 22 meq/L (ref 19–32)
CREATININE: 1.1 mg/dL (ref 0.50–1.35)
Chloride: 104 mEq/L (ref 96–112)
GFR calc Af Amer: 86 mL/min — ABNORMAL LOW (ref >90)
GFR calc non Af Amer: 74 mL/min — ABNORMAL LOW (ref >90)
Glucose, Bld: 92 mg/dL (ref 70–99)
Potassium: 4.6 mEq/L (ref 3.7–5.3)
Sodium: 140 mEq/L (ref 137–147)
Total Bilirubin: 0.4 mg/dL (ref 0.3–1.2)
Total Protein: 6.4 g/dL (ref 6.0–8.3)

## 2013-11-16 LAB — TROPONIN I

## 2013-11-16 LAB — LIPASE, BLOOD: Lipase: 29 U/L (ref 11–59)

## 2013-11-16 LAB — LIPID PANEL
CHOLESTEROL: 152 mg/dL (ref 0–200)
HDL: 36 mg/dL — AB (ref >39)
LDL CALC: 89 mg/dL (ref 0–99)
TRIGLYCERIDES: 134 mg/dL (ref <150)
Total CHOL/HDL Ratio: 4.2 RATIO
VLDL: 27 mg/dL (ref 0–40)

## 2013-11-16 MED ORDER — KETOROLAC TROMETHAMINE 30 MG/ML IJ SOLN
30.0000 mg | Freq: Once | INTRAMUSCULAR | Status: DC
  Filled 2013-11-16: qty 1

## 2013-11-16 MED ORDER — DIPHENHYDRAMINE HCL 50 MG/ML IJ SOLN
25.0000 mg | Freq: Once | INTRAMUSCULAR | Status: DC
  Filled 2013-11-16: qty 1

## 2013-11-16 MED ORDER — PANTOPRAZOLE SODIUM 40 MG PO TBEC
40.0000 mg | DELAYED_RELEASE_TABLET | Freq: Every day | ORAL | Status: DC
  Administered 2013-11-17: 40 mg via ORAL
  Filled 2013-11-16: qty 1

## 2013-11-16 MED ORDER — METOCLOPRAMIDE HCL 5 MG/ML IJ SOLN
10.0000 mg | Freq: Once | INTRAMUSCULAR | Status: DC
  Filled 2013-11-16 (×2): qty 2

## 2013-11-16 NOTE — Plan of Care (Signed)
Problem: Phase I Progression Outcomes Goal: Aspirin unless contraindicated Outcome: Completed/Met Date Met:  11/16/13 324 mg of ASA Given in ED

## 2013-11-16 NOTE — Progress Notes (Signed)
UR completed 

## 2013-11-16 NOTE — Progress Notes (Signed)
[facility] PROGRESS NOTE   Craig MunchJimmy F [name] HQI:[id number] DOB: 1956-03-26 DOA: 11/15/2013 PCP: Patria ManeGASSEMI, MIKE, MD  HPI/Subjective: No chest pain, no other complaints.  Assessment/Plan: Principal Problem:   Chest pain Active Problems:   Chronic pain syndrome   History of duodenal ulcer    Chest pain -Cardiology consulted, Raynelle FanningJulie EKG showed no ischemic findings. 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 -2-D echocardiogram per cardiology to rule out pericarditis/effusion. -Continue aspirin and beta blockers.  Hypertension -Continue home medications, reasonably controlled.  Chronic pain syndrome -Patient Opana 30 mg twice a day and oxycodone 30 mg 4 times a day. -Home pain medications she ordered.  Code Status: Full code Family Communication: Plan discussed with the patient. Disposition Plan: Remains inpatient   Consultants:  Cardiology  Procedures:  None  Antibiotics:  None   Objective: Filed Vitals:   11/16/13 1121  BP: 124/82  Pulse: 64  Temp: 98.4 F (36.9 C)  Resp: 18   No intake or output data in the 24 hours ending 11/16/13 1318 Filed Weights   11/15/13 2218 11/16/13 0400  Weight: 116.801 kg (257 lb 8 oz) 116.801 kg (257 lb 8 oz)    Exam: General: Alert and awake, oriented x3, not in any acute distress. HEENT: anicteric sclera, pupils reactive to light and accommodation, EOMI CVS: S1-S2 clear, no murmur rubs or gallops Chest: clear to auscultation bilaterally, no wheezing, rales or rhonchi Abdomen: soft nontender, nondistended, normal bowel sounds, no organomegaly Extremities: no cyanosis, clubbing or edema noted bilaterally Neuro: Cranial nerves II-XII intact, no focal neurological deficits  Data Reviewed: Basic Metabolic Panel:  Recent Labs Lab 11/15/13 1749 11/16/13 0436  NA 140 140  K 4.3 4.6  CL 99 104  CO2 25 22  GLUCOSE 101* 92  BUN 13 13  CREATININE 1.08 1.10  CALCIUM 9.6 9.1    Liver Function Tests:  Recent Labs Lab 11/15/13 1749 11/16/13 0436  AST 18 15  ALT 20 16  ALKPHOS 79 67  BILITOT 0.2* 0.4  PROT 7.7 6.4  ALBUMIN 4.1 3.5    Recent Labs Lab 11/16/13  LIPASE 29   No results found for this basename: AMMONIA,  in the last 168 hours CBC:  Recent Labs Lab 11/15/13 1749  WBC 8.6  NEUTROABS 4.5  HGB 15.8  HCT 46.5  MCV 88.7  PLT 249   Cardiac Enzymes:  Recent Labs Lab 11/16/13 11/16/13 0436  TROPONINI <0.30 <0.30   BNP (last 3 results) No results found for this basename: PROBNP,  in the last 8760 hours CBG: No results found for this basename: GLUCAP,  in the last 168 hours  Micro No results found for this or any previous visit (from the past 240 hour(s)).   Studies: Dg Chest 2 View  11/15/2013   CLINICAL DATA:  Mid chest pain.  Shortness of breath for 6 hr.  EXAM: CHEST  2 VIEW  COMPARISON:  None.  FINDINGS: Cardiopericardial silhouette within normal limits. Mediastinal contours normal. Trachea midline. No airspace disease or effusion.  IMPRESSION: No active cardiopulmonary disease.   Electronically Signed   By: Andreas NewportGeoffrey  Lamke M.D.   On: 11/15/2013 18:33    Scheduled Meds: . aspirin EC  325 mg Oral Once  . aspirin EC  81 mg Oral Daily  . atenolol  100 mg Oral Daily  . atorvastatin  80 mg Oral Daily  . clonazePAM  2 mg Oral QID  . diphenhydrAMINE  25 mg Intravenous Once  .  enoxaparin (LOVENOX) injection  40 mg Subcutaneous Daily  . ketorolac  30 mg Intravenous Once  . metoCLOPramide (REGLAN) injection  10 mg Intravenous Once  . morphine  90 mg Oral Q12H  . pantoprazole (PROTONIX) IV  40 mg Intravenous Q12H   Continuous Infusions:      Time spent: 35 minutes    Clydia LlanoMutaz Taia Bramlett  [facility] Pager [phone] If 7PM-7AM, please contact night-coverage at [email or web address], password [email or web address] 11/16/2013, 1:18 PM  LOS: 1 day

## 2013-11-16 NOTE — Progress Notes (Signed)
  Echocardiogram 2D Echocardiogram has been performed.  [name] 11/16/2013, 4:40 PM

## 2013-11-16 NOTE — Consult Note (Signed)
HPI: 58 year old male with no prior cardiac history for evaluation of chest pain. Cardiac catheterization in May 2004 showed normal coronary arteries and normal LV function. Patient typically denies dyspnea on exertion, orthopnea, PND, pedal edema, syncope or exertional chest pain. At noon yesterday he developed sudden onset of substernal chest pressure. The pain increased with inspiration, lying flat and improved with the upright position. There was nausea and dyspnea but no diaphoresis. The pain has been continuous since then. Cardiology asked to evaluate. No recent travel. No recent viral illnesses.  Medications Prior to Admission  Medication Sig Dispense Refill  . atenolol (TENORMIN) 100 MG tablet Take 100 mg by mouth daily.      Marland Kitchen atorvastatin (LIPITOR) 80 MG tablet Take 80 mg by mouth daily.      . clonazePAM (KLONOPIN) 2 MG tablet Take 2 mg by mouth 4 (four) times daily.      . Diclofenac Sodium (PENNSAID) 1.5 % SOLN Place 8 drops onto the skin 2 (two) times daily. *4 drops onto both knees*      . diphenhydrAMINE (BENADRYL) 25 mg capsule Take 25 mg by mouth daily as needed for allergies.      Marland Kitchen oxycodone (ROXICODONE) 30 MG immediate release tablet Take 30 mg by mouth 4 (four) times daily.       Marland Kitchen oxymorphone (OPANA ER) 30 MG 12 hr tablet Take 30 mg by mouth every 12 (twelve) hours.        Allergies  Allergen Reactions  . Imitrex [Sumatriptan]     Chest pain, 2004    Past Medical History  Diagnosis Date  . Hypertension   . Chronic knee pain   . Hyperlipemia   . PUD (peptic ulcer disease)     Past Surgical History  Procedure Laterality Date  . I&d extremity Left 07/12/2013    Procedure: I&D Left Thigh Abscess;  Surgeon: Wylene Simmer, MD;  Location: [facility];  Service: Orthopedics;  Laterality: Left;  . Replacement total knee bilateral    . Tonsillectomy    . Hernia repair    . Cholecystectomy    . Appendectomy    . Shoulder surgery    . Elbow surgery      History    Social History  . Marital Status: Married    Spouse Name: N/A    Number of Children: 3  . Years of Education: N/A   Occupational History  .      Sales   Social History Main Topics  . Smoking status: Never Smoker   . Smokeless tobacco: Not on file  . Alcohol Use: No  . Drug Use: No  . Sexual Activity: Not on file   Other Topics Concern  . Not on file   Social History Narrative  . No narrative on file    Family History  Problem Relation Age of Onset  . Heart attack Paternal Grandmother 75  . Heart disease Mother 22    arrythmia  . Stroke Paternal Grandfather   . Stroke Maternal Grandfather     ROS:  Chronic knee pain but no fevers or chills, productive cough, hemoptysis, dysphasia, odynophagia, melena, hematochezia, dysuria, hematuria, rash, seizure activity, orthopnea, PND, pedal edema, claudication. Remaining systems are negative.  Physical Exam:   Blood pressure 98/65, pulse 41, temperature 98 F (36.7 C), temperature source Oral, resp. rate 19, height 6' 4" (1.93 m), weight 257 lb 8 oz (116.801 kg), SpO2 96.00%.  General:  Well developed/well nourished in NAD  Skin warm/dry Patient not depressed No peripheral clubbing Back-normal HEENT-normal/normal eyelids Neck supple/normal carotid upstroke bilaterally; no bruits; no JVD; no thyromegaly chest - CTA/ normal expansion CV - RRR/normal S1 and S2; no murmurs, rubs or gallops;  PMI nondisplaced Abdomen -NT/ND, no HSM, no mass, + bowel sounds, no bruit 2+ femoral pulses, no bruits Ext-no edema, chords, 2+ DP Neuro-grossly nonfocal  ECG Sinus rhythm with no ST changes  Results for orders placed during the hospital encounter of 11/15/13 (from the past 48 hour(s))  CBC WITH DIFFERENTIAL     Status: None   Collection Time    11/15/13  5:49 PM      Result Value Ref Range   WBC 8.6  4.0 - 10.5 K/uL   RBC 5.24  4.22 - 5.81 MIL/uL   Hemoglobin 15.8  13.0 - 17.0 g/dL   HCT 46.5  39.0 - 52.0 %   MCV 88.7  78.0 -  100.0 fL   MCH 30.2  26.0 - 34.0 pg   MCHC 34.0  30.0 - 36.0 g/dL   RDW 13.7  11.5 - 15.5 %   Platelets 249  150 - 400 K/uL   Neutrophils Relative % 52  43 - 77 %   Neutro Abs 4.5  1.7 - 7.7 K/uL   Lymphocytes Relative 36  12 - 46 %   Lymphs Abs 3.1  0.7 - 4.0 K/uL   Monocytes Relative 9  3 - 12 %   Monocytes Absolute 0.8  0.1 - 1.0 K/uL   Eosinophils Relative 2  0 - 5 %   Eosinophils Absolute 0.2  0.0 - 0.7 K/uL   Basophils Relative 1  0 - 1 %   Basophils Absolute 0.0  0.0 - 0.1 K/uL  COMPREHENSIVE METABOLIC PANEL     Status: Abnormal   Collection Time    11/15/13  5:49 PM      Result Value Ref Range   Sodium 140  137 - 147 mEq/L   Potassium 4.3  3.7 - 5.3 mEq/L   Chloride 99  96 - 112 mEq/L   CO2 25  19 - 32 mEq/L   Glucose, Bld 101 (*) 70 - 99 mg/dL   BUN 13  6 - 23 mg/dL   Creatinine, Ser 1.08  0.50 - 1.35 mg/dL   Calcium 9.6  8.4 - 10.5 mg/dL   Total Protein 7.7  6.0 - 8.3 g/dL   Albumin 4.1  3.5 - 5.2 g/dL   AST 18  0 - 37 U/L   ALT 20  0 - 53 U/L   Alkaline Phosphatase 79  39 - 117 U/L   Total Bilirubin 0.2 (*) 0.3 - 1.2 mg/dL   GFR calc non Af Amer 75 (*) >90 mL/min   GFR calc Af Amer 87 (*) >90 mL/min   Comment: (NOTE)     The eGFR has been calculated using the CKD EPI equation.     This calculation has not been validated in all clinical situations.     eGFR's persistently <90 mL/min signify possible Chronic Kidney     Disease.  Randolm Idol, ED     Status: None   Collection Time    11/15/13  5:59 PM      Result Value Ref Range   Troponin i, poc 0.00  0.00 - 0.08 ng/mL   Comment 3            Comment: Due to the release kinetics of cTnI,  a negative result within the first hours     of the onset of symptoms does not rule out     myocardial infarction with certainty.     If myocardial infarction is still suspected,     repeat the test at appropriate intervals.  D-DIMER, QUANTITATIVE     Status: None   Collection Time    11/15/13  7:07 PM       Result Value Ref Range   D-Dimer, Quant <0.27  0.00 - 0.48 ug/mL-FEU   Comment:            AT THE INHOUSE ESTABLISHED CUTOFF     VALUE OF 0.48 ug/mL FEU,     THIS ASSAY HAS BEEN DOCUMENTED     IN THE LITERATURE TO HAVE     A SENSITIVITY AND NEGATIVE     PREDICTIVE VALUE OF AT LEAST     98 TO 99%.  THE TEST RESULT     SHOULD BE CORRELATED WITH     AN ASSESSMENT OF THE CLINICAL     PROBABILITY OF DVT / VTE.  TROPONIN I     Status: None   Collection Time    11/16/13 12:00 AM      Result Value Ref Range   Troponin I <0.30  <0.30 ng/mL   Comment:            Due to the release kinetics of cTnI,     a negative result within the first hours     of the onset of symptoms does not rule out     myocardial infarction with certainty.     If myocardial infarction is still suspected,     repeat the test at appropriate intervals.  LIPASE, BLOOD     Status: None   Collection Time    11/16/13 12:00 AM      Result Value Ref Range   Lipase 29  11 - 59 U/L  LIPID PANEL     Status: Abnormal   Collection Time    11/16/13  4:20 AM      Result Value Ref Range   Cholesterol 152  0 - 200 mg/dL   Triglycerides 134  <150 mg/dL   HDL 36 (*) >39 mg/dL   Total CHOL/HDL Ratio 4.2     VLDL 27  0 - 40 mg/dL   LDL Cholesterol 89  0 - 99 mg/dL   Comment:            Total Cholesterol/HDL:CHD Risk     Coronary Heart Disease Risk Table                         Men   Women      1/2 Average Risk   3.4   3.3      Average Risk       5.0   4.4      2 X Average Risk   9.6   7.1      3 X Average Risk  23.4   11.0                Use the calculated Patient Ratio     above and the CHD Risk Table     to determine the patient's CHD Risk.                ATP III CLASSIFICATION (LDL):      <100     mg/dL   Optimal  100-129  mg/dL   Near or Above                        Optimal      130-159  mg/dL   Borderline      160-189  mg/dL   High      >190     mg/dL   Very High  TROPONIN I     Status: None   Collection  Time    11/16/13  4:36 AM      Result Value Ref Range   Troponin I <0.30  <0.30 ng/mL   Comment:            Due to the release kinetics of cTnI,     a negative result within the first hours     of the onset of symptoms does not rule out     myocardial infarction with certainty.     If myocardial infarction is still suspected,     repeat the test at appropriate intervals.  COMPREHENSIVE METABOLIC PANEL     Status: Abnormal   Collection Time    11/16/13  4:36 AM      Result Value Ref Range   Sodium 140  137 - 147 mEq/L   Potassium 4.6  3.7 - 5.3 mEq/L   Chloride 104  96 - 112 mEq/L   CO2 22  19 - 32 mEq/L   Glucose, Bld 92  70 - 99 mg/dL   BUN 13  6 - 23 mg/dL   Creatinine, Ser 1.10  0.50 - 1.35 mg/dL   Calcium 9.1  8.4 - 10.5 mg/dL   Total Protein 6.4  6.0 - 8.3 g/dL   Albumin 3.5  3.5 - 5.2 g/dL   AST 15  0 - 37 U/L   ALT 16  0 - 53 U/L   Alkaline Phosphatase 67  39 - 117 U/L   Total Bilirubin 0.4  0.3 - 1.2 mg/dL   GFR calc non Af Amer 74 (*) >90 mL/min   GFR calc Af Amer 86 (*) >90 mL/min   Comment: (NOTE)     The eGFR has been calculated using the CKD EPI equation.     This calculation has not been validated in all clinical situations.     eGFR's persistently <90 mL/min signify possible Chronic Kidney     Disease.    Dg Chest 2 View  11/15/2013   CLINICAL DATA:  Mid chest pain.  Shortness of breath for 6 hr.  EXAM: CHEST  2 VIEW  COMPARISON:  None.  FINDINGS: Cardiopericardial silhouette within normal limits. Mediastinal contours normal. Trachea midline. No airspace disease or effusion.  IMPRESSION: No active cardiopulmonary disease.   Electronically Signed   By: Dereck Ligas M.D.   On: 11/15/2013 18:33    Assessment/Plan 1 chest pain-symptoms do not sound consistent with ischemia. ECG with no ST changes and enzymes negative. Increase with inspiration and lying flat. Also increase with movement. Question pericarditis. Note d-dimer normal. Plan echocardiogram to assess  LV function and to rule out pericardial effusion. Would add nonsteroidal such as Motrin to see if symptoms improve. I do not think further ischemia evaluation warranted. 2 hypertension-continue present medications. 3 hyperlipidemia-continue statin. Patient does not require further cardiology followup if echocardiogram normal. Please call with questions.  Kirk Ruths MD 11/16/2013, 8:59 AM

## 2013-11-16 NOTE — Progress Notes (Signed)
Pt c/o of upper left abd pain after eating. Pt is concerned he may have ulcer, has hx of one. Notified Dr. Arthor CaptainElmahi of patient concerns. Emelda Brothershristy Kayd Launer RN

## 2013-11-17 MED ORDER — PANTOPRAZOLE SODIUM 40 MG PO TBEC: 40.0000 mg | DELAYED_RELEASE_TABLET | Freq: Every day | ORAL | Status: DC

## 2013-11-17 NOTE — Progress Notes (Signed)
Patient Name: [name] Date of Encounter: 11/17/2013  Principal Problem:   Chest pain Active Problems:   Chronic pain syndrome   History of duodenal ulcer    Patient Profile: 58 yo male w/ hx clean cath 2004 was admitted w/ chest pain, worse w/ deep insp/lying flat.   SUBJECTIVE: Symptoms have improved. Pt feels most likely GI in origin.  OBJECTIVE Filed Vitals:   11/16/13 2000 11/17/13 0000 11/17/13 0400 11/17/13 0806  BP: 141/76 103/71 108/70 114/74  Pulse: 67 59 62 65  Temp: 98.8 F (37.1 C) 98.7 F (37.1 C) 98.5 F (36.9 C) 98.4 F (36.9 C)  TempSrc: Oral Oral Oral Oral  Resp: 18 18 18 18   Height:      Weight:  257 lb 14.4 oz (116.983 kg)    SpO2: 98% 98% 98% 100%    Intake/Output Summary (Last 24 hours) at 11/17/13 0926 Last data filed at 11/17/13 [id number]  Gross per 24 hour  Intake    600 ml  Output      0 ml  Net    600 ml   Filed Weights   11/15/13 2218 11/16/13 0400 11/17/13 0000  Weight: 257 lb 8 oz (116.801 kg) 257 lb 8 oz (116.801 kg) 257 lb 14.4 oz (116.983 kg)    PHYSICAL EXAM General: Well developed, well nourished, male in no acute distress. Head: Normocephalic, atraumatic.  Neck: Supple without bruits, JVD not elevated Lungs:  Resp regular and unlabored, CTA bilaterally. Heart: RRR, S1, S2, no S3, S4, soft murmur; no rub. Abdomen: Soft, non-tender, non-distended, BS + x 4.  Extremities: No clubbing, cyanosis, no edema.  Neuro: Alert and oriented X 3. Moves all extremities spontaneously. Psych: Normal affect.  LABS: CBC: Recent Labs  11/15/13 1749  WBC 8.6  NEUTROABS 4.5  HGB 15.8  HCT 46.5  MCV 88.7  PLT 249   Basic Metabolic Panel: Recent Labs  11/15/13 1749 11/16/13 0436  NA 140 140  K 4.3 4.6  CL 99 104  CO2 25 22  GLUCOSE 101* 92  BUN 13 13  CREATININE 1.08 1.10  CALCIUM 9.6 9.1   Liver Function Tests: Recent Labs  11/15/13 1749 11/16/13 0436  AST 18 15  ALT 20 16  ALKPHOS 79 67  BILITOT 0.2* 0.4    PROT 7.7 6.4  ALBUMIN 4.1 3.5   Cardiac Enzymes: Recent Labs  11/16/13 11/16/13 0436  TROPONINI <0.30 <0.30    Recent Labs  11/15/13 1759  TROPIPOC 0.00   D-dimer: Recent Labs  11/15/13 1907  DDIMER <0.27   Fasting Lipid Panel: Recent Labs  11/16/13 0420  CHOL 152  HDL 36*  LDLCALC 89  TRIG [id number]  CHOLHDL 4.2   TELE:   SR, ST     ECHO: 11/16/2013  Study Conclusions - Left ventricle: The cavity size was normal. There was mild concentric hypertrophy. Systolic function was normal. The estimated ejection fraction was in the range of 50% to 55%. Wall motion was normal; there were no regional wall motion abnormalities. - Pulmonary arteries: Systolic pressure was mildly increased. PA peak pressure: 31mm Hg (S).  Radiology/Studies: Dg Chest 2 View 11/15/2013   CLINICAL DATA:  Mid chest pain.  Shortness of breath for 6 hr.  EXAM: CHEST  2 VIEW  COMPARISON:  None.  FINDINGS: Cardiopericardial silhouette within normal limits. Mediastinal contours normal. Trachea midline. No airspace disease or effusion.  IMPRESSION: No active cardiopulmonary disease.   Electronically Signed   By: Juliene PinaGeoffrey  Lamke M.D.   On: 11/15/2013 18:33     Current Medications:  . aspirin EC  325 mg Oral Once  . aspirin EC  81 mg Oral Daily  . atenolol  100 mg Oral Daily  . atorvastatin  80 mg Oral Daily  . clonazePAM  2 mg Oral QID  . diphenhydrAMINE  25 mg Intravenous Once  . enoxaparin (LOVENOX) injection  40 mg Subcutaneous Daily  . ketorolac  30 mg Intravenous Once  . metoCLOPramide (REGLAN) injection  10 mg Intravenous Once  . morphine  90 mg Oral Q12H  . pantoprazole  40 mg Oral Daily      ASSESSMENT AND PLAN: Principal Problem:   Chest pain - ez negative MI, echo w/ no WMA or effusion. Symptoms improved w/ PPI, toradol x 1 and pain Rx.   Active Problems:   Chronic pain syndrome - per IM    History of duodenal ulcer - would continue PPI, OTC if cannot afford Rx  Plan - no further  cardiac eval planned. Will see PRN.  Signed, Darrol JumpRhonda G Barrett , PA-C 9:26 AM 11/17/2013 Patient seen and examined and history reviewed. Agree with above findings and plan. Cardiac work up is all negative. Enzymes and Ecg are normal. Echo with mild LVH but otherwise normal. No evidence of pericarditis. Symptoms improving with PPI. Patient admits he hasn't been taking antihypertensive Rx and cholesterol lowering therapy as he should. Is motivated now to be more compliant. We will sign off.  Demetria Poreeter M Community HospitalJordanMD,FACC 11/17/2013 10:16 AM

## 2013-11-17 NOTE — Progress Notes (Signed)
[facility] PROGRESS NOTE   Craig MunchJimmy F Hornbeck [id number] DOB: 06-19-1956 DOA: 11/15/2013 PCP: Patria ManeGASSEMI, MIKE, MD Interim summary: [name] is a 58 y.o. male with Past medical history of hypertension, chronic knee pain on chronic high-dose opioids, dyslipidemia. The patient presented with complaints of chest pain. Started around noon, he was sitting at his computer, he started having substernal chest heaviness nonradiating. After the chest pain when he was climbing up and down the stairs he had shortness of breath. He denies any palpitation, dizziness, fever, chills, runny nose, congestion, leg swelling, orthopnea, PND, nausea, vomiting, abdominal pain. He was admitted to the hospitalist service for evaluation of ACS. It was ruled out and cardiology consulted and recommendations given. Because he has a h/o of duodenal ulcer and his pain improved with protonix, will call gastroenterology consult for further evaluation .    HPI/Subjective: Chest pain improved,  no other complaints.  Assessment/Plan: Principal Problem:   Chest pain Active Problems:   Chronic pain syndrome   History of duodenal ulcer    Chest pain - improved. Probably  Non cardiac in origin. -Cardiology consulted,  EKG showed no ischemic findings. 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 -2-D echocardiogram per cardiology to rule out pericarditis/effusion. Echocardiogram showed EF of 50 to 55% with no regional wall motion abnormalities. Cardiology signed off.  -Continue aspirin and beta blockers. - he was started on protonix and reported that his chest pain has improved greatly.   Hypertension -Continue home medications, reasonably controlled.  Chronic pain syndrome -Patient Opana 30 mg twice a day and oxycodone 30 mg 4 times a day. -Home pain medications    H/o Duodenal Ulcer: - pt reports his pain is much improved on protonix. Continue the same. Request gastroenterology  consult for evaluation of PUD.   DVT prophylaxis;  Code Status: Full code Family Communication: Plan discussed with the patient. Disposition Plan: can go home probably tomorrow after evaluation by GI   Consultants:  Cardiology  Gastroenterology Dr Elnoria HowardHung.  Procedures:  None  Antibiotics:  None   Objective: Filed Vitals:   11/17/13 1208  BP: 106/65  Pulse: 66  Temp: 98 F (36.7 C)  Resp: 18    Intake/Output Summary (Last 24 hours) at 11/17/13 1355 Last data filed at 11/17/13 1252  Gross per 24 hour  Intake    840 ml  Output      0 ml  Net    840 ml   Filed Weights   11/15/13 2218 11/16/13 0400 11/17/13 0000  Weight: 116.801 kg (257 lb 8 oz) 116.801 kg (257 lb 8 oz) 116.983 kg (257 lb 14.4 oz)    Exam: General: Alert and awake, oriented x3, not in any acute distress. HEENT: anicteric sclera, pupils reactive to light and accommodation, EOMI CVS: S1-S2 clear, no murmur rubs or gallops Chest: clear to auscultation bilaterally, no wheezing, rales or rhonchi Abdomen: soft nontender, nondistended, normal bowel sounds, no organomegaly Extremities: no cyanosis, clubbing or edema noted bilaterally Neuro: Cranial nerves II-XII intact, no focal neurological deficits  Data Reviewed: Basic Metabolic Panel:  Recent Labs Lab 11/15/13 1749 11/16/13 0436  NA 140 140  K 4.3 4.6  CL 99 104  CO2 25 22  GLUCOSE 101* 92  BUN 13 13  CREATININE 1.08 1.10  CALCIUM 9.6 9.1   Liver Function Tests:  Recent Labs Lab 11/15/13 1749 11/16/13 0436  AST 18 15  ALT 20 16  ALKPHOS 79 67  BILITOT 0.2*  0.4  PROT 7.7 6.4  ALBUMIN 4.1 3.5    Recent Labs Lab 11/16/13  LIPASE 29   No results found for this basename: AMMONIA,  in the last 168 hours CBC:  Recent Labs Lab 11/15/13 1749  WBC 8.6  NEUTROABS 4.5  HGB 15.8  HCT 46.5  MCV 88.7  PLT 249   Cardiac Enzymes:  Recent Labs Lab 11/16/13 11/16/13 0436  TROPONINI <0.30 <0.30   BNP (last 3 results) No  results found for this basename: PROBNP,  in the last 8760 hours CBG: No results found for this basename: GLUCAP,  in the last 168 hours  Micro No results found for this or any previous visit (from the past 240 hour(s)).   Studies: Dg Chest 2 View  11/15/2013   CLINICAL DATA:  Mid chest pain.  Shortness of breath for 6 hr.  EXAM: CHEST  2 VIEW  COMPARISON:  None.  FINDINGS: Cardiopericardial silhouette within normal limits. Mediastinal contours normal. Trachea midline. No airspace disease or effusion.  IMPRESSION: No active cardiopulmonary disease.   Electronically Signed   By: Andreas NewportGeoffrey  Lamke M.D.   On: 11/15/2013 18:33    Scheduled Meds: . aspirin EC  325 mg Oral Once  . aspirin EC  81 mg Oral Daily  . atenolol  100 mg Oral Daily  . atorvastatin  80 mg Oral Daily  . clonazePAM  2 mg Oral QID  . diphenhydrAMINE  25 mg Intravenous Once  . enoxaparin (LOVENOX) injection  40 mg Subcutaneous Daily  . metoCLOPramide (REGLAN) injection  10 mg Intravenous Once  . morphine  90 mg Oral Q12H  . pantoprazole  40 mg Oral Daily   Continuous Infusions:      Time spent: 35 minutes    Kathlen ModyVijaya Toshiye Kever  [facility] Pager [phone] If 7PM-7AM, please contact night-coverage at [email or web address], password [email or web address] 11/17/2013, 1:55 PM  LOS: 2 days

## 2013-11-17 NOTE — Consult Note (Signed)
Reason for Consult: Noncardiac Chest Pain Referring Physician: [facility]  [name] HPI: This is a 58 year old male with a PMH of chronic knee pain, hyperlipidemia, and HTN who was admitted for complaints of chest pain.  His pain started acutely and it was described as a nonradiating heaviness in the substernal region.  It felt as if "an elephant was sitting on my chest".  Shortly afterwards he started to have SOB.  As a result of his symptoms he presented to the hospital for further evaluation.  Cardiology evaluated the patient and it appears that he has noncardiac chest pain.  In the distant past he had a duodenal ulcer.  Currently he reports that his pain is exactly the same as his duodenal ulcer pain. His pain has improved since starting on Protonix and it migrated from the chest region to the LUQ.  No reports of melena, hematochezia, or hematemesis.  The patient's HGB is stable at 15.8 g/dL.  Past Medical History  Diagnosis Date  . Hypertension   . Chronic knee pain   . Hyperlipemia   . PUD (peptic ulcer disease)     Past Surgical History  Procedure Laterality Date  . I&d extremity Left 07/12/2013    Procedure: I&D Left Thigh Abscess;  Surgeon: Toni ArthursJohn [name], MD;  [facility];  Service: Orthopedics;  Laterality: Left;  . Replacement total knee bilateral    . Tonsillectomy    . Hernia repair    . Cholecystectomy    . Appendectomy    . Shoulder surgery    . Elbow surgery      Family History  Problem Relation Age of Onset  . Heart attack Paternal Grandmother 1655  . Heart disease Mother 2470    arrythmia  . Stroke Paternal Grandfather   . Stroke Maternal Grandfather     Social History:  reports that he has never smoked. He does not have any smokeless tobacco history on file. He reports that he does not drink alcohol or use illicit drugs.  Allergies:  Allergies  Allergen Reactions  . Imitrex [Sumatriptan]     Chest pain, 2004    Medications:  Scheduled: .  aspirin EC  325 mg Oral Once  . aspirin EC  81 mg Oral Daily  . atenolol  100 mg Oral Daily  . atorvastatin  80 mg Oral Daily  . clonazePAM  2 mg Oral QID  . diphenhydrAMINE  25 mg Intravenous Once  . enoxaparin (LOVENOX) injection  40 mg Subcutaneous Daily  . metoCLOPramide (REGLAN) injection  10 mg Intravenous Once  . morphine  90 mg Oral Q12H  . pantoprazole  40 mg Oral Daily   Continuous:   No results found for this or any previous visit (from the past 24 hour(s)).   Dg Chest 2 View  11/15/2013   CLINICAL DATA:  Mid chest pain.  Shortness of breath for 6 hr.  EXAM: CHEST  2 VIEW  COMPARISON:  None.  FINDINGS: Cardiopericardial silhouette within normal limits. Mediastinal contours normal. Trachea midline. No airspace disease or effusion.  IMPRESSION: No active cardiopulmonary disease.   Electronically Signed   By: Andreas NewportGeoffrey  Lamke M.D.   On: 11/15/2013 18:33    ROS:  As stated above in the HPI otherwise negative.  Blood pressure 106/65, pulse 66, temperature 98 F (36.7 C), temperature source Oral, resp. rate 18, height 6\' 4"  (1.93 m), weight 257 lb 14.4 oz (116.983 kg), SpO2 97.00%.    PE: Gen: NAD,  Alert and Oriented HEENT:  Sebastian/AT, EOMI Neck: Supple, no LAD Lungs: CTA Bilaterally CV: RRR without M/G/R ABM: Soft, mild LUQ tenderness, +BS Ext: No C/C/E  Assessment/Plan: 1) Possible ulcer pain. 2) Noncardiac chest pain.   The patient reports that he is better.  I offered him two options:  EGD tomorrow or to treat with PPI and perform an EGD if his pain does not resolve.  He is hemodynamically stable and he denies any issues with bleeding.  After further discussion he wants to try medication first.  [facility] [place] [facility]  PPI QD will be fine for his current symptoms.  Theda Belfastatrick D Alice Burnside 11/17/2013, 2:08 PM

## 2013-11-20 NOTE — Discharge Summary (Addendum)
Physician Discharge Summary  Craig MunchJimmy F [name] [id number] DOB: May 24, 1956 DOA: 11/15/2013  PCP: Patria ManeGASSEMI, MIKE, MD  Admit date: 11/15/2013 Discharge date: 11/17/2013  Time spent: 30 minutes  Recommendations for Outpatient Follow-up:  1. Follow up with PCP in one week 2. Follow up with GI as recommended.   Discharge Diagnoses:  Principal Problem:  Atypical Chest pain GERD Active Problems:   Chronic pain syndrome   History of duodenal ulcer   Discharge Condition: IMPROVED.  Diet recommendation: low sodium diet.   Filed Weights   11/15/13 2218 11/16/13 0400 11/17/13 0000  Weight: 116.801 kg (257 lb 8 oz) 116.801 kg (257 lb 8 oz) 116.983 kg (257 lb 14.4 oz)    History of present illness:  Craig MunchJimmy F [name] is a 58 y.o. male with Past medical history of hypertension, chronic knee pain on chronic high-dose opioids, dyslipidemia. The patient presented with complaints of chest pain. Started around noon, he was sitting at his computer, he started having substernal chest heaviness nonradiating. After the chest pain when he was climbing up and down the stairs he had shortness of breath. He denies any palpitation, dizziness, fever, chills, runny nose, congestion, leg swelling, orthopnea, PND, nausea, vomiting, abdominal pain. He was admitted to the hospitalist service for evaluation of ACS. It was ruled out and cardiology consulted and recommendations given. Because he has a h/o of duodenal ulcer and his pain improved with protonix, called gastroenterology consult for further evaluation . Outpatient follow up.   Hospital Course:  Chest pain  - improved. Probably Non cardiac in origin.  -Cardiology consulted, EKG showed no ischemic findings.  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  -2-D echocardiogram per cardiology to rule out pericarditis/effusion. Echocardiogram showed EF of 50 to 55% with no regional wall motion abnormalities. Cardiology signed off.  -Continue  aspirin and beta blockers.  - he was started on protonix and reported that his chest pain has improved greatly.  Hypertension  -Continue home medications, reasonably controlled.  Chronic pain syndrome  -Patient Opana 30 mg twice a day and oxycodone 30 mg 4 times a day.  -Home pain medications  H/o Duodenal Ulcer:  - pt reports his pain is much improved on protonix. Continue the same. Request gastroenterology consult for evaluation of PUD. Outpatient follow up .    Procedures:  echo  Consultations:  GI  CARDIOLOGY  Discharge Exam: Filed Vitals:   11/17/13 1500  BP: 119/73  Pulse: 61  Temp: 98.3 F (36.8 C)  Resp: 18    General: alert afebrile comfortable Cardiovascular: s1s2 Respiratory: ctab  Discharge Instructions You were cared for by a hospitalist during your hospital stay. If you have any questions about your discharge medications or the care you received while you were in the hospital after you are discharged, you can call the unit and asked to speak with the hospitalist on call if the hospitalist that took care of you is not available. Once you are discharged, your primary care physician will handle any further medical issues. Please note that NO REFILLS for any discharge medications will be authorized once you are discharged, as it is imperative that you return to your primary care physician (or establish a relationship with a primary care physician if you do not have one) for your aftercare needs so that they can reassess your need for medications and monitor your lab values.  Discharge Orders   Future Orders Complete By Expires   Discharge instructions  As directed  Medication List    STOP taking these medications       PENNSAID 1.5 % Soln  Generic drug:  Diclofenac Sodium      TAKE these medications       atenolol 100 MG tablet  Commonly known as:  TENORMIN  Take 100 mg by mouth daily.     atorvastatin 80 MG tablet  Commonly known as:   LIPITOR  Take 80 mg by mouth daily.     clonazePAM 2 MG tablet  Commonly known as:  KLONOPIN  Take 2 mg by mouth 4 (four) times daily.     diphenhydrAMINE 25 mg capsule  Commonly known as:  BENADRYL  Take 25 mg by mouth daily as needed for allergies.     oxycodone 30 MG immediate release tablet  Commonly known as:  ROXICODONE  Take 30 mg by mouth 4 (four) times daily.     oxymorphone 30 MG 12 hr tablet  Commonly known as:  OPANA ER  Take 30 mg by mouth every 12 (twelve) hours.     pantoprazole 40 MG tablet  Commonly known as:  PROTONIX  Take 1 tablet (40 mg total) by mouth daily.       Allergies  Allergen Reactions  . Imitrex [Sumatriptan]     Chest pain, 2004       Follow-up Information   Follow up with Patria ManeGASSEMI, MIKE, MD. Schedule an appointment as soon as possible for a visit in 1 week.   Specialty:  Internal Medicine   Contact information:   [address] [place] [address] [phone]       Follow up with Theda BelfastHUNG,PATRICK D, MD. (As needed)    Specialty:  Gastroenterology   Contact information:   [address], [address] [place] [address] [phone]        The results of significant diagnostics from this hospitalization (including imaging, microbiology, ancillary and laboratory) are listed below for reference.    Significant Diagnostic Studies: Dg Chest 2 View  11/15/2013   CLINICAL DATA:  Mid chest pain.  Shortness of breath for 6 hr.  EXAM: CHEST  2 VIEW  COMPARISON:  None.  FINDINGS: Cardiopericardial silhouette within normal limits. Mediastinal contours normal. Trachea midline. No airspace disease or effusion.  IMPRESSION: No active cardiopulmonary disease.   Electronically Signed   By: Andreas NewportGeoffrey  Lamke M.D.   On: 11/15/2013 18:33    Microbiology: No results found for this or any previous visit (from the past 240 hour(s)).   Labs: Basic Metabolic Panel:  Recent Labs Lab 11/15/13 1749 11/16/13 0436  NA 140 140  K 4.3  4.6  CL 99 104  CO2 25 22  GLUCOSE 101* 92  BUN 13 13  CREATININE 1.08 1.10  CALCIUM 9.6 9.1   Liver Function Tests:  Recent Labs Lab 11/15/13 1749 11/16/13 0436  AST 18 15  ALT 20 16  ALKPHOS 79 67  BILITOT 0.2* 0.4  PROT 7.7 6.4  ALBUMIN 4.1 3.5    Recent Labs Lab 11/16/13  LIPASE 29   No results found for this basename: AMMONIA,  in the last 168 hours CBC:  Recent Labs Lab 11/15/13 1749  WBC 8.6  NEUTROABS 4.5  HGB 15.8  HCT 46.5  MCV 88.7  PLT 249   Cardiac Enzymes:  Recent Labs Lab 11/16/13 11/16/13 0436  TROPONINI <0.30 <0.30   BNP: BNP (last 3 results) No results found for this basename: PROBNP,  in the last 8760 hours CBG:  No results found for this basename: GLUCAP,  in the last 168 hours     Signed:  Kathlen Mody  [facility] 11/17/2013, 3:53 PM

## 2014-10-05 ENCOUNTER — Emergency Department (HOSPITAL_BASED_OUTPATIENT_CLINIC_OR_DEPARTMENT_OTHER)
Admission: EM | Admit: 2014-10-05 | Discharge: 2014-10-05 | Disposition: A | Discharge status: Home / Self Care | Payer: Medicaid Other | Attending: Emergency Medicine | Admitting: Emergency Medicine

## 2014-10-05 ENCOUNTER — Encounter (HOSPITAL_BASED_OUTPATIENT_CLINIC_OR_DEPARTMENT_OTHER): Payer: Self-pay | Admitting: *Deleted

## 2014-10-05 DIAGNOSIS — E785 Hyperlipidemia, unspecified: Secondary | ICD-10-CM | POA: Diagnosis not present

## 2014-10-05 DIAGNOSIS — Y998 Other external cause status: Secondary | ICD-10-CM | POA: Insufficient documentation

## 2014-10-05 DIAGNOSIS — K279 Peptic ulcer, site unspecified, unspecified as acute or chronic, without hemorrhage or perforation: Secondary | ICD-10-CM | POA: Diagnosis not present

## 2014-10-05 DIAGNOSIS — G8929 Other chronic pain: Secondary | ICD-10-CM | POA: Diagnosis not present

## 2014-10-05 DIAGNOSIS — Z79891 Long term (current) use of opiate analgesic: Secondary | ICD-10-CM | POA: Insufficient documentation

## 2014-10-05 DIAGNOSIS — I10 Essential (primary) hypertension: Secondary | ICD-10-CM | POA: Diagnosis not present

## 2014-10-05 DIAGNOSIS — X58XXXA Exposure to other specified factors, initial encounter: Secondary | ICD-10-CM | POA: Insufficient documentation

## 2014-10-05 DIAGNOSIS — S60562A Insect bite (nonvenomous) of left hand, initial encounter: Secondary | ICD-10-CM | POA: Diagnosis not present

## 2014-10-05 DIAGNOSIS — Y9289 Other specified places as the place of occurrence of the external cause: Secondary | ICD-10-CM | POA: Insufficient documentation

## 2014-10-05 DIAGNOSIS — W57XXXA Bitten or stung by nonvenomous insect and other nonvenomous arthropods, initial encounter: Secondary | ICD-10-CM

## 2014-10-05 DIAGNOSIS — Y9389 Activity, other specified: Secondary | ICD-10-CM | POA: Insufficient documentation

## 2014-10-05 DIAGNOSIS — Z79899 Other long term (current) drug therapy: Secondary | ICD-10-CM | POA: Diagnosis not present

## 2014-10-05 MED ORDER — DIPHENHYDRAMINE HCL 25 MG PO CAPS
50.0000 mg | ORAL_CAPSULE | Freq: Once | ORAL | Status: AC
  Administered 2014-10-05: 50 mg via ORAL
  Filled 2014-10-05: qty 2

## 2014-10-05 MED ORDER — DIPHENHYDRAMINE HCL 25 MG PO TABS: 25.0000 mg | ORAL_TABLET | Freq: Four times a day (QID) | ORAL | Status: DC

## 2014-10-05 MED ORDER — PREDNISONE 50 MG PO TABS
60.0000 mg | ORAL_TABLET | Freq: Once | ORAL | Status: AC
  Administered 2014-10-05: 60 mg via ORAL
  Filled 2014-10-05 (×2): qty 1

## 2014-10-05 MED ORDER — HYDROMORPHONE HCL 1 MG/ML IJ SOLN
2.0000 mg | Freq: Once | INTRAMUSCULAR | Status: AC
  Administered 2014-10-05: 2 mg via INTRAMUSCULAR
  Filled 2014-10-05: qty 2

## 2014-10-05 MED ORDER — PREDNISONE 20 MG PO TABS: 40.0000 mg | ORAL_TABLET | Freq: Every day | ORAL | Status: DC

## 2014-10-05 NOTE — ED Notes (Signed)
Was in the attic today and noticed an hour afterward he had severe pain and swelling in his left hand. He has 3 pinpoint red areas to his left wrist with red streaks going up his hand and arm.

## 2014-10-05 NOTE — ED Provider Notes (Signed)
CSN: [id number]     Arrival date & time 10/05/14  [id number] History  This chart was scribed for Gwyneth SproutWhitney Briant Angelillo, MD by Bronson CurbJacqueline Melvin, ED Scribe. This patient was seen in room MH06/MH06 and the patient's care was started at 7:26 PM.   No chief complaint on file.   The history is provided by the patient. No language interpreter was used.     HPI Comments: [name] is a 59 y.o. male, who presents to the Emergency Department complaining of sudden onset left hand swelling with worsening constant 7/10  pain that radiates to the wrist for the past 5 hours. Patient states he was in the attic today and noticed 3 pinpoint areas on his hand approximately an hour later with associated redness. The pain is exacerbated with movement of the wrist. He states he is a pain patient for the past 15 years and notes taking 3 30mg  oxycodone 4 times daily and 30mg  morphine twice daily. He denies history of DM. He further denies diaphoresis, palpitations. Patient is right hand dominant and UTD on tetanus   Past Medical History  Diagnosis Date  . Hypertension   . Chronic knee pain   . Hyperlipemia   . PUD (peptic ulcer disease)    Past Surgical History  Procedure Laterality Date  . I&d extremity Left 07/12/2013    Procedure: I&D Left Thigh Abscess;  Surgeon: Toni ArthursJohn [name], MD;  [facility];  Service: Orthopedics;  Laterality: Left;  . Replacement total knee bilateral    . Tonsillectomy    . Hernia repair    . Cholecystectomy    . Appendectomy    . Shoulder surgery    . Elbow surgery     Family History  Problem Relation Age of Onset  . Heart attack Paternal Grandmother 655  . Heart disease Mother 3770    arrythmia  . Stroke Paternal Grandfather   . Stroke Maternal Grandfather    History  Substance Use Topics  . Smoking status: Never Smoker   . Smokeless tobacco: Not on file  . Alcohol Use: No    Review of Systems  Musculoskeletal: Positive for myalgias, joint swelling and arthralgias.  All  other systems reviewed and are negative.     Allergies  Imitrex  Home Medications   Prior to Admission medications   Medication Sig Start Date End Date Taking? Authorizing Provider  MORPHINE SULFATE PO Take by mouth.   Yes Historical Provider, MD  atenolol (TENORMIN) 100 MG tablet Take 100 mg by mouth daily.    Historical Provider, MD  atorvastatin (LIPITOR) 80 MG tablet Take 80 mg by mouth daily.    Historical Provider, MD  clonazePAM (KLONOPIN) 2 MG tablet Take 2 mg by mouth 4 (four) times daily.    Historical Provider, MD  diphenhydrAMINE (BENADRYL) 25 mg capsule Take 25 mg by mouth daily as needed for allergies.    Historical Provider, MD  oxycodone (ROXICODONE) 30 MG immediate release tablet Take 30 mg by mouth 4 (four) times daily.     Historical Provider, MD  oxymorphone (OPANA ER) 30 MG 12 hr tablet Take 30 mg by mouth every 12 (twelve) hours.    Historical Provider, MD  pantoprazole (PROTONIX) 40 MG tablet Take 1 tablet (40 mg total) by mouth daily. 11/17/13   Kathlen ModyVijaya Akula, MD   Triage Vitals: BP 141/92 mmHg  Pulse 61  Temp(Src) 98.6 F (37 C) (Oral)  Resp 18  Ht 6\' 4"  (1.93 m)  Wt 250 lb (  113.399 kg)  BMI 30.44 kg/m2  SpO2 98%  Physical Exam  Constitutional: He is oriented to person, place, and time. He appears well-developed and well-nourished. No distress.  HENT:  Head: Normocephalic and atraumatic.  Eyes: Conjunctivae and EOM are normal.  Neck: Neck supple. No tracheal deviation present.  Cardiovascular: Normal rate.   Pulses:      Radial pulses are 2+ on the left side.  Pulmonary/Chest: Effort normal. No respiratory distress.  Musculoskeletal: Normal range of motion. He exhibits edema and tenderness.  3 small puncture sites on the volar surface of the left wrist with significant warmth, swelling, and pain in the left hand. 2+ radial pulse.  Less than 3 second capillary refill.  Neurological: He is alert and oriented to person, place, and time.  Skin: Skin is  warm and dry.  Psychiatric: He has a normal mood and affect. His behavior is normal.  Nursing note and vitals reviewed.   ED Course  Procedures (including critical care time)  DIAGNOSTIC STUDIES: Oxygen Saturation is 98% on room air, normal by my interpretation.    COORDINATION OF CARE: At 1930 Discussed treatment plan with patient which includes Prednisone, Benadryl, and RICE. Patient agrees.   Labs Review Labs Reviewed - No data to display  Imaging Review No results found.   EKG Interpretation None      MDM   Final diagnoses:  Insect bite of hand with local reaction, left, initial encounter    Patient appears to be have an allergic reaction from a type of insect bite. Denies trauma. He has left hand tenderness, warmth, swelling distal to 3 small puncture marks on his left wrist. Normal pulse and circulation. Patient treated with prednisone and Benadryl. Patient is seen in the pain clinic and has chronic pain medication he will take for the pain.  I personally performed the services described in this documentation, which was scribed in my presence.  The recorded information has been reviewed and considered.    Gwyneth Sprout, MD 10/06/14 [phone]

## 2014-10-05 NOTE — ED Notes (Signed)
Scanner charging at time meds given.

## 2014-10-08 ENCOUNTER — Encounter (HOSPITAL_BASED_OUTPATIENT_CLINIC_OR_DEPARTMENT_OTHER): Payer: Self-pay | Admitting: Emergency Medicine

## 2014-10-08 ENCOUNTER — Emergency Department (HOSPITAL_BASED_OUTPATIENT_CLINIC_OR_DEPARTMENT_OTHER)
Admission: EM | Admit: 2014-10-08 | Discharge: 2014-10-08 | Disposition: A | Discharge status: Home / Self Care | Payer: Medicaid Other | Attending: Emergency Medicine | Admitting: Emergency Medicine

## 2014-10-08 DIAGNOSIS — Z8711 Personal history of peptic ulcer disease: Secondary | ICD-10-CM | POA: Insufficient documentation

## 2014-10-08 DIAGNOSIS — Z79899 Other long term (current) drug therapy: Secondary | ICD-10-CM | POA: Insufficient documentation

## 2014-10-08 DIAGNOSIS — G8929 Other chronic pain: Secondary | ICD-10-CM | POA: Diagnosis not present

## 2014-10-08 DIAGNOSIS — I10 Essential (primary) hypertension: Secondary | ICD-10-CM | POA: Diagnosis not present

## 2014-10-08 DIAGNOSIS — M1990 Unspecified osteoarthritis, unspecified site: Secondary | ICD-10-CM | POA: Diagnosis not present

## 2014-10-08 DIAGNOSIS — Z8639 Personal history of other endocrine, nutritional and metabolic disease: Secondary | ICD-10-CM | POA: Diagnosis not present

## 2014-10-08 DIAGNOSIS — L03114 Cellulitis of left upper limb: Secondary | ICD-10-CM | POA: Insufficient documentation

## 2014-10-08 LAB — BASIC METABOLIC PANEL
Anion gap: 9 (ref 5–15)
BUN: 15 mg/dL (ref 6–23)
CHLORIDE: 107 mmol/L (ref 96–112)
CO2: 24 mmol/L (ref 19–32)
CREATININE: 1.05 mg/dL (ref 0.50–1.35)
Calcium: 8.9 mg/dL (ref 8.4–10.5)
GFR calc Af Amer: 90 mL/min — ABNORMAL LOW (ref >90)
GFR calc non Af Amer: 78 mL/min — ABNORMAL LOW (ref >90)
Glucose, Bld: 99 mg/dL (ref 70–99)
POTASSIUM: 3.7 mmol/L (ref 3.5–5.1)
Sodium: 140 mmol/L (ref 135–145)

## 2014-10-08 LAB — CBC WITH DIFFERENTIAL/PLATELET
BASOS ABS: 0 10*3/uL (ref 0.0–0.1)
Band Neutrophils: 0 % (ref 0–10)
Basophils Relative: 0 % (ref 0–1)
Blasts: 0 %
Eosinophils Absolute: 0 10*3/uL (ref 0.0–0.7)
Eosinophils Relative: 0 % (ref 0–5)
HCT: 38.7 % — ABNORMAL LOW (ref 39.0–52.0)
Hemoglobin: 12.9 g/dL — ABNORMAL LOW (ref 13.0–17.0)
Lymphocytes Relative: 24 % (ref 12–46)
Lymphs Abs: 2.1 10*3/uL (ref 0.7–4.0)
MCH: 29.1 pg (ref 26.0–34.0)
MCHC: 33.3 g/dL (ref 30.0–36.0)
MCV: 87.4 fL (ref 78.0–100.0)
MYELOCYTES: 0 %
Metamyelocytes Relative: 0 %
Monocytes Absolute: 0.5 10*3/uL (ref 0.1–1.0)
Monocytes Relative: 6 % (ref 3–12)
NEUTROS ABS: 6 10*3/uL (ref 1.7–7.7)
NEUTROS PCT: 70 % (ref 43–77)
PLATELETS: 296 10*3/uL (ref 150–400)
PROMYELOCYTES ABS: 0 %
RBC: 4.43 MIL/uL (ref 4.22–5.81)
RDW: 14.9 % (ref 11.5–15.5)
WBC: 8.6 10*3/uL (ref 4.0–10.5)
nRBC: 0 /100 WBC

## 2014-10-08 LAB — SEDIMENTATION RATE: SED RATE: 25 mm/h — AB (ref 0–16)

## 2014-10-08 LAB — URIC ACID: Uric Acid, Serum: 4.2 mg/dL (ref 4.0–7.8)

## 2014-10-08 MED ORDER — METHYLPREDNISOLONE SODIUM SUCC 125 MG IJ SOLR
125.0000 mg | Freq: Once | INTRAMUSCULAR | Status: AC
  Administered 2014-10-08: 125 mg via INTRAVENOUS
  Filled 2014-10-08: qty 2

## 2014-10-08 MED ORDER — PREDNISONE 20 MG PO TABS: 40.0000 mg | ORAL_TABLET | Freq: Every day | ORAL | Status: DC

## 2014-10-08 MED ORDER — MORPHINE SULFATE 4 MG/ML IJ SOLN
4.0000 mg | Freq: Once | INTRAMUSCULAR | Status: AC
  Administered 2014-10-08: 4 mg via INTRAVENOUS
  Filled 2014-10-08: qty 1

## 2014-10-08 MED ORDER — KETOROLAC TROMETHAMINE 30 MG/ML IJ SOLN
30.0000 mg | Freq: Once | INTRAMUSCULAR | Status: AC
  Administered 2014-10-08: 30 mg via INTRAVENOUS
  Filled 2014-10-08: qty 1

## 2014-10-08 MED ORDER — CEPHALEXIN 500 MG PO CAPS: 500.0000 mg | ORAL_CAPSULE | Freq: Four times a day (QID) | ORAL | Status: DC

## 2014-10-08 MED ORDER — CEFAZOLIN SODIUM 1-5 GM-% IV SOLN: 1.0000 g | Freq: Once | INTRAVENOUS | Status: AC

## 2014-10-08 MED ORDER — CEFAZOLIN SODIUM 1 G IJ SOLR
INTRAMUSCULAR | Status: AC
  Administered 2014-10-08: 1000 mg
  Filled 2014-10-08: qty 10

## 2014-10-08 MED ORDER — ONDANSETRON HCL 4 MG/2ML IJ SOLN
4.0000 mg | Freq: Once | INTRAMUSCULAR | Status: AC
  Administered 2014-10-08: 4 mg via INTRAVENOUS
  Filled 2014-10-08: qty 2

## 2014-10-08 MED ORDER — MORPHINE SULFATE 4 MG/ML IJ SOLN
4.0000 mg | INTRAMUSCULAR | Status: DC | PRN
  Administered 2014-10-08: 4 mg via INTRAVENOUS
  Filled 2014-10-08: qty 1

## 2014-10-08 NOTE — ED Notes (Signed)
Pt states was seen on Friday for same and symptoms have gotten worse. Increased swelling and redness to area. Pt states was in attic and may have been bit by spider.

## 2014-10-08 NOTE — ED Notes (Signed)
Ancef infused.

## 2014-10-08 NOTE — ED Provider Notes (Signed)
CSN: [id number]     Arrival date & time 10/08/14  [id number] History   First MD Initiated Contact with Patient 10/08/14 [phone]     Chief Complaint  Patient presents with  . Abscess     HPI  Vision presents for evaluation of left wrist pain and swelling. Seen and evaluated 3/11. He was in his attic. He put his hand up her Rafter. He felt a sudden "pinprick" on the volar left wrist. 5 hours later it was more painful and red. Seen and evaluated here. this was probable bite with allergic response. Placed on prednisone. He states he's had continued and worsening pain and swelling. Nose the point that his wrist is painful to move and has some red streaks near his ulnar styloid.  His had a history of gout in the past. Not on any prophylactic medications. Also history of osteoarthritis, status post bilateral total knee arthroplasty 2. No history of rheumatoid.  Past Medical History  Diagnosis Date  . Hypertension   . Chronic knee pain   . Hyperlipemia   . PUD (peptic ulcer disease)    Past Surgical History  Procedure Laterality Date  . I&d extremity Left 07/12/2013    Procedure: I&D Left Thigh Abscess;  Surgeon: [name], MD;  Location: [facility] OR;  Service: Orthopedics;  Laterality: Left;  . Replacement total knee bilateral    . Tonsillectomy    . Hernia repair    . Cholecystectomy    . Appendectomy    . Shoulder surgery    . Elbow surgery     Family History  Problem Relation Age of Onset  . Heart attack Paternal Grandmother 19  . Heart disease Mother 43    arrythmia  . Stroke Paternal Grandfather   . Stroke Maternal Grandfather    History  Substance Use Topics  . Smoking status: Never Smoker   . Smokeless tobacco: Not on file  . Alcohol Use: No    Review of Systems  Constitutional: Negative for fever, chills, diaphoresis, appetite change and fatigue.  HENT: Negative for mouth sores, sore throat and trouble swallowing.   Eyes: Negative for visual disturbance.  Respiratory:  Negative for cough, chest tightness, shortness of breath and wheezing.   Cardiovascular: Negative for chest pain.  Gastrointestinal: Negative for nausea, vomiting, abdominal pain, diarrhea and abdominal distention.  Endocrine: Negative for polydipsia, polyphagia and polyuria.  Genitourinary: Negative for dysuria, frequency and hematuria.  Musculoskeletal: Positive for joint swelling and arthralgias. Negative for gait problem.  Skin: Positive for rash. Negative for color change and pallor.  Neurological: Negative for dizziness, syncope, light-headedness and headaches.  Hematological: Does not bruise/bleed easily.  Psychiatric/Behavioral: Negative for behavioral problems and confusion.      Allergies  Imitrex  Home Medications   Prior to Admission medications   Medication Sig Start Date End Date Taking? Authorizing Provider  atenolol (TENORMIN) 100 MG tablet Take 100 mg by mouth daily.    Historical Provider, MD  atorvastatin (LIPITOR) 80 MG tablet Take 80 mg by mouth daily.    Historical Provider, MD  cephALEXin (KEFLEX) 500 MG capsule Take 1 capsule (500 mg total) by mouth 4 (four) times daily. 10/08/14   Rolland Porter, MD  clonazePAM (KLONOPIN) 2 MG tablet Take 2 mg by mouth 4 (four) times daily.    Historical Provider, MD  diphenhydrAMINE (BENADRYL) 25 MG tablet Take 1 tablet (25 mg total) by mouth every 6 (six) hours. 10/05/14   Gwyneth Sprout, MD  MORPHINE SULFATE PO Take  by mouth.    Historical Provider, MD  oxycodone (ROXICODONE) 30 MG immediate release tablet Take 30 mg by mouth 4 (four) times daily.     Historical Provider, MD  oxymorphone (OPANA ER) 30 MG 12 hr tablet Take 30 mg by mouth every 12 (twelve) hours.    Historical Provider, MD  pantoprazole (PROTONIX) 40 MG tablet Take 1 tablet (40 mg total) by mouth daily. 11/17/13   Kathlen ModyVijaya Akula, MD  predniSONE (DELTASONE) 20 MG tablet Take 2 tablets (40 mg total) by mouth daily with breakfast. 10/08/14   Rolland PorterMark Ledell Codrington, MD   BP 144/99  mmHg  Pulse 84  Temp(Src) 98.5 F (36.9 C) (Oral)  Resp 18  Wt 250 lb (113.399 kg)  SpO2 100% Physical Exam  Constitutional: He is oriented to person, place, and time. He appears well-developed and well-nourished. No distress.  HENT:  Head: Normocephalic.  Eyes: Conjunctivae are normal. Pupils are equal, round, and reactive to light. No scleral icterus.  Neck: Normal range of motion. Neck supple. No thyromegaly present.  Cardiovascular: Normal rate and regular rhythm.  Exam reveals no gallop and no friction rub.   No murmur heard. Pulmonary/Chest: Effort normal and breath sounds normal. No respiratory distress. He has no wheezes. He has no rales.  Abdominal: Soft. Bowel sounds are normal. He exhibits no distension. There is no tenderness. There is no rebound.  Musculoskeletal:       Arms: Neurological: He is alert and oriented to person, place, and time.  Skin: Skin is warm and dry. No rash noted.  Psychiatric: He has a normal mood and affect. His behavior is normal.    ED Course  Procedures (including critical care time) Labs Review Labs Reviewed  CBC WITH DIFFERENTIAL/PLATELET - Abnormal; Notable for the following:    Hemoglobin 12.9 (*)    HCT 38.7 (*)    All other components within normal limits  SEDIMENTATION RATE - Abnormal; Notable for the following:    Sed Rate 25 (*)    All other components within normal limits  BASIC METABOLIC PANEL - Abnormal; Notable for the following:    GFR calc non Af Amer 78 (*)    GFR calc Af Amer 90 (*)    All other components within normal limits  URIC ACID    Imaging Review No results found.   EKG Interpretation None      MDM   Final diagnoses:  Cellulitis of left upper extremity  Arthritis    Symptoms are not typical are classic for brown recluse. Toxic brown recluse not endemic to this area. The bites themselves appear quite minimal. This may be reactive gouty arthritis. This may be cellulitis. IV is placed. Given  steroids, anti-inflammatories, antibiotics, pain medication. CBC, sedimentation rate, uric acid level requested and pending.  No leukocytosis. Minimal elevation of sedimentation rate at 25. Normal uric acid. Normal renal function. Given pain medication with morphine 2 doses, IV Toradol, site Medrol, and Ancef. I think is appropriate for outpatient treatment. He is placed in a volar Velcro splint. Vascular recheck with his physician by this Thursday or Friday if not markedly improving. Recheck here with more proximal spread, fevers chills or other worsening symptoms. I think his pain is primarily from his cellulitis. I think that limited range of motion is due to cellulitis and perhaps also a sympathetic or reactive arthritis.    Rolland PorterMark Dmauri Rosenow, MD 10/08/14 1102

## 2014-10-08 NOTE — Discharge Instructions (Signed)
Recheck with your primary care physician this week. Cellulitis is typically slow to respond, however you should not worsen. If you develop fevers chills worsening redness or spread towards your elbow, please recheck here.  Arthritis, Nonspecific Arthritis is inflammation of a joint. This usually means pain, redness, warmth or swelling are present. One or more joints may be involved. There are a number of types of arthritis. Your caregiver may not be able to tell what type of arthritis you have right away. CAUSES  The most common cause of arthritis is the wear and tear on the joint (osteoarthritis). This causes damage to the cartilage, which can break down over time. The knees, hips, back and neck are most often affected by this type of arthritis. Other types of arthritis and common causes of joint pain include:  Sprains and other injuries near the joint. Sometimes minor sprains and injuries cause pain and swelling that develop hours later.  Rheumatoid arthritis. This affects hands, feet and knees. It usually affects both sides of your body at the same time. It is often associated with chronic ailments, fever, weight loss and general weakness.  Crystal arthritis. Gout and pseudo gout can cause occasional acute severe pain, redness and swelling in the foot, ankle, or knee.  Infectious arthritis. Bacteria can get into a joint through a break in overlying skin. This can cause infection of the joint. Bacteria and viruses can also spread through the blood and affect your joints.  Drug, infectious and allergy reactions. Sometimes joints can become mildly painful and slightly swollen with these types of illnesses. SYMPTOMS   Pain is the main symptom.  Your joint or joints can also be red, swollen and warm or hot to the touch.  You may have a fever with certain types of arthritis, or even feel overall ill.  The joint with arthritis will hurt with movement. Stiffness is present with some types of  arthritis. DIAGNOSIS  Your caregiver will suspect arthritis based on your description of your symptoms and on your exam. Testing may be needed to find the type of arthritis:  Blood and sometimes urine tests.  X-ray tests and sometimes CT or MRI scans.  Removal of fluid from the joint (arthrocentesis) is done to check for bacteria, crystals or other causes. Your caregiver (or a specialist) will numb the area over the joint with a local anesthetic, and use a needle to remove joint fluid for examination. This procedure is only minimally uncomfortable.  Even with these tests, your caregiver may not be able to tell what kind of arthritis you have. Consultation with a specialist (rheumatologist) may be helpful. TREATMENT  Your caregiver will discuss with you treatment specific to your type of arthritis. If the specific type cannot be determined, then the following general recommendations may apply. Treatment of severe joint pain includes:  Rest.  Elevation.  Anti-inflammatory medication (for example, ibuprofen) may be prescribed. Avoiding activities that cause increased pain.  Only take over-the-counter or prescription medicines for pain and discomfort as recommended by your caregiver.  Cold packs over an inflamed joint may be used for 10 to 15 minutes every hour. Hot packs sometimes feel better, but do not use overnight. Do not use hot packs if you are diabetic without your caregiver's permission.  A cortisone shot into arthritic joints may help reduce pain and swelling.  Any acute arthritis that gets worse over the next 1 to 2 days needs to be looked at to be sure there is no joint infection.  Long-term arthritis treatment involves modifying activities and lifestyle to reduce joint stress jarring. This can include weight loss. Also, exercise is needed to nourish the joint cartilage and remove waste. This helps keep the muscles around the joint strong. HOME CARE INSTRUCTIONS   Do not take  aspirin to relieve pain if gout is suspected. This elevates uric acid levels.  Only take over-the-counter or prescription medicines for pain, discomfort or fever as directed by your caregiver.  Rest the joint as much as possible.  If your joint is swollen, keep it elevated.  Use crutches if the painful joint is in your leg.  Drinking plenty of fluids may help for certain types of arthritis.  Follow your caregiver's dietary instructions.  Try low-impact exercise such as:  Swimming.  Water aerobics.  Biking.  Walking.  Morning stiffness is often relieved by a warm shower.  Put your joints through regular range-of-motion. SEEK MEDICAL CARE IF:   You do not feel better in 24 hours or are getting worse.  You have side effects to medications, or are not getting better with treatment. SEEK IMMEDIATE MEDICAL CARE IF:   You have a fever.  You develop severe joint pain, swelling or redness.  Many joints are involved and become painful and swollen.  There is severe back pain and/or leg weakness.  You have loss of bowel or bladder control. Document Released: 08/20/2004 Document Revised: 10/05/2011 Document Reviewed: 09/05/2008 [facility] Patient Information 2015 [place], [place]. This information is not intended to replace advice given to you by your health care provider. Make sure you discuss any questions you have with your health care provider.  Cellulitis Cellulitis is an infection of the skin and the tissue beneath it. The infected area is usually red and tender. Cellulitis occurs most often in the arms and lower legs.  CAUSES  Cellulitis is caused by bacteria that enter the skin through cracks or cuts in the skin. The most common types of bacteria that cause cellulitis are staphylococci and streptococci. SIGNS AND SYMPTOMS   Redness and warmth.  Swelling.  Tenderness or pain.  Fever. DIAGNOSIS  Your health care provider can usually determine what is wrong based on a  physical exam. Blood tests may also be done. TREATMENT  Treatment usually involves taking an antibiotic medicine. HOME CARE INSTRUCTIONS   Take your antibiotic medicine as directed by your health care provider. Finish the antibiotic even if you start to feel better.  Keep the infected arm or leg elevated to reduce swelling.  Apply a warm cloth to the affected area up to 4 times per day to relieve pain.  Take medicines only as directed by your health care provider.  Keep all follow-up visits as directed by your health care provider. SEEK MEDICAL CARE IF:   You notice red streaks coming from the infected area.  Your red area gets larger or turns dark in color.  Your bone or joint underneath the infected area becomes painful after the skin has healed.  Your infection returns in the same area or another area.  You notice a swollen bump in the infected area.  You develop new symptoms.  You have a fever. SEEK IMMEDIATE MEDICAL CARE IF:   You feel very sleepy.  You develop vomiting or diarrhea.  You have a general ill feeling (malaise) with muscle aches and pains. MAKE SURE YOU:   Understand these instructions.  Will watch your condition.  Will get help right away if you are not doing well or  get worse. Document Released: 04/22/2005 Document Revised: 11/27/2013 Document Reviewed: 09/28/2011 [facility] Patient Information 2015 [place], [place]. This information is not intended to replace advice given to you by your health care provider. Make sure you discuss any questions you have with your health care provider.

## 2014-12-30 ENCOUNTER — Emergency Department (HOSPITAL_BASED_OUTPATIENT_CLINIC_OR_DEPARTMENT_OTHER)
Admission: EM | Admit: 2014-12-30 | Discharge: 2014-12-31 | Disposition: A | Discharge status: Home / Self Care | Payer: Medicaid Other | Attending: Emergency Medicine | Admitting: Emergency Medicine

## 2014-12-30 ENCOUNTER — Encounter (HOSPITAL_BASED_OUTPATIENT_CLINIC_OR_DEPARTMENT_OTHER): Payer: Self-pay | Admitting: *Deleted

## 2014-12-30 ENCOUNTER — Emergency Department (HOSPITAL_BASED_OUTPATIENT_CLINIC_OR_DEPARTMENT_OTHER): Payer: Medicaid Other

## 2014-12-30 DIAGNOSIS — Z79899 Other long term (current) drug therapy: Secondary | ICD-10-CM | POA: Insufficient documentation

## 2014-12-30 DIAGNOSIS — G8918 Other acute postprocedural pain: Secondary | ICD-10-CM | POA: Diagnosis not present

## 2014-12-30 DIAGNOSIS — I10 Essential (primary) hypertension: Secondary | ICD-10-CM | POA: Insufficient documentation

## 2014-12-30 DIAGNOSIS — Z96652 Presence of left artificial knee joint: Secondary | ICD-10-CM | POA: Diagnosis not present

## 2014-12-30 DIAGNOSIS — E785 Hyperlipidemia, unspecified: Secondary | ICD-10-CM | POA: Diagnosis not present

## 2014-12-30 DIAGNOSIS — G8929 Other chronic pain: Secondary | ICD-10-CM | POA: Insufficient documentation

## 2014-12-30 DIAGNOSIS — M25562 Pain in left knee: Secondary | ICD-10-CM | POA: Diagnosis present

## 2014-12-30 DIAGNOSIS — Z8711 Personal history of peptic ulcer disease: Secondary | ICD-10-CM | POA: Insufficient documentation

## 2014-12-30 DIAGNOSIS — Z7952 Long term (current) use of systemic steroids: Secondary | ICD-10-CM | POA: Diagnosis not present

## 2014-12-30 DIAGNOSIS — R52 Pain, unspecified: Secondary | ICD-10-CM

## 2014-12-30 NOTE — ED Notes (Signed)
Pt had knee replacement surgery 2 weeks ago and is here due to intolerable pain.  One week ago the pain began to increase and over the last 36 hours the pain has become "the worst pain I have ever experienced".  Pain is mid knee down, no drainage, incision appears WDL.  No fever or chills

## 2014-12-30 NOTE — ED Provider Notes (Signed)
CSN: [id number]     Arrival date & time 12/30/14  2231 History  This chart was scribed for Clydell Alberts, MD by Octavia Heir, ED Scribe. This patient was seen in room MH03/MH03 and the patient's care was started at 11:55 PM.    Chief Complaint  Patient presents with  . Post-op Problem     Patient is a 59 y.o. male presenting with knee pain. The history is provided by the patient.  Knee Pain Location:  Knee Injury: no   Knee location:  L knee Pain details:    Quality:  Throbbing   Radiates to:  Does not radiate   Severity:  Severe   Onset quality:  Gradual   Timing:  Constant   Progression:  Worsening Chronicity:  Recurrent Dislocation: no   Foreign body present:  No foreign bodies Tetanus status:  Up to date Relieved by:  Nothing Worsened by:  Nothing tried Ineffective treatments: narcotics. Associated symptoms: no back pain, no fever and no stiffness   Risk factors: no concern for non-accidental trauma     HPI Comments: [name] is a 59 y.o. male who presents to the Emergency Department complaining of constant, gradual worsening knee pain about 2.5  week ago. Pt had a total knee replacement 3 weeks ago on 5/18 by Dr. Julius Bowels at [place] but notes one week ago his pain began to increase.  No f/c/r.  Has not called Dr. Julius Bowels.  No redness no drainage.  No calf pain.  Is on xarelto. Pt was prescribed morphine and oxycodone to alleviate the pain with no relief. Pt denies fevers, chills, and drainage from wound. Pt states his post op appointment is 6/25  Past Medical History  Diagnosis Date  . Hypertension   . Chronic knee pain   . Hyperlipemia   . PUD (peptic ulcer disease)    Past Surgical History  Procedure Laterality Date  . I&d extremity Left 07/12/2013    Procedure: I&D Left Thigh Abscess;  Surgeon: [name], MD;  Location: [facility] OR;  Service: Orthopedics;  Laterality: Left;  . Replacement total knee bilateral    . Tonsillectomy    . Hernia repair    .  Cholecystectomy    . Appendectomy    . Shoulder surgery    . Elbow surgery     Family History  Problem Relation Age of Onset  . Heart attack Paternal Grandmother 80  . Heart disease Mother 76    arrythmia  . Stroke Paternal Grandfather   . Stroke Maternal Grandfather    History  Substance Use Topics  . Smoking status: Never Smoker   . Smokeless tobacco: Not on file  . Alcohol Use: No    Review of Systems  Constitutional: Negative for fever and chills.  Respiratory: Negative for cough.   Cardiovascular: Negative for chest pain, palpitations and leg swelling.  Musculoskeletal: Positive for arthralgias. Negative for back pain, gait problem and stiffness.  Skin: Negative for color change, pallor, rash and wound.  All other systems reviewed and are negative.     Allergies  Imitrex  Home Medications   Prior to Admission medications   Medication Sig Start Date End Date Taking? Authorizing Provider  atenolol (TENORMIN) 100 MG tablet Take 100 mg by mouth daily.    Historical Provider, MD  atorvastatin (LIPITOR) 80 MG tablet Take 80 mg by mouth daily.    Historical Provider, MD  cephALEXin (KEFLEX) 500 MG capsule Take 1 capsule (500 mg total) by mouth  4 (four) times daily. 10/08/14   Rolland PorterMark James, MD  clonazePAM (KLONOPIN) 2 MG tablet Take 2 mg by mouth 4 (four) times daily.    Historical Provider, MD  diphenhydrAMINE (BENADRYL) 25 MG tablet Take 1 tablet (25 mg total) by mouth every 6 (six) hours. 10/05/14   Gwyneth SproutWhitney Plunkett, MD  MORPHINE SULFATE PO Take by mouth.    Historical Provider, MD  oxycodone (ROXICODONE) 30 MG immediate release tablet Take 30 mg by mouth 4 (four) times daily.     Historical Provider, MD  oxymorphone (OPANA ER) 30 MG 12 hr tablet Take 30 mg by mouth every 12 (twelve) hours.    Historical Provider, MD  pantoprazole (PROTONIX) 40 MG tablet Take 1 tablet (40 mg total) by mouth daily. 11/17/13   Kathlen ModyVijaya Akula, MD  predniSONE (DELTASONE) 20 MG tablet Take 2  tablets (40 mg total) by mouth daily with breakfast. 10/08/14   Rolland PorterMark James, MD   Triage vitals: BP 118/89 mmHg  Pulse 130  Temp(Src) 98.9 F (37.2 C) (Oral)  Resp 22  Wt 245 lb (111.131 kg)  SpO2 97% Physical Exam  Constitutional: He is oriented to person, place, and time. He appears well-developed and well-nourished. No distress.  HENT:  Head: Normocephalic and atraumatic.  Mouth/Throat: Oropharynx is clear and moist.  Moist mucus membranes  Eyes: EOM are normal. Pupils are equal, round, and reactive to light.  Neck: Normal range of motion. Neck supple.  Cardiovascular: Normal rate, regular rhythm and intact distal pulses.   Pulmonary/Chest: Effort normal and breath sounds normal. No respiratory distress. He has no wheezes. He has no rales.  Abdominal: Soft. Bowel sounds are normal. There is no tenderness. There is no rebound and no guarding.  Musculoskeletal: Normal range of motion. He exhibits no edema or tenderness.  DP/PT intact 2+ pulse LLE and foot L foot NVI No warmth erythema nor fluctuance of the left knee.  Incision CDI, no palpable effusion FROM of the left knee tendons are intact.  Negative anterior and posterior drawer tests of B knee No cords, No calf tenderness  Neurological: He is alert and oriented to person, place, and time. He has normal reflexes.  Skin: Skin is warm and dry. He is not diaphoretic.  Psychiatric: He has a normal mood and affect.  Nursing note and vitals reviewed.   ED Course  Procedures  DIAGNOSTIC STUDIES: Oxygen Saturation is 97% on RA, normal by my interpretation.  COORDINATION OF CARE:  11:55 PM Discussed treatment plan which includes  with pt at bedside and pt agreed to plan.   Labs Review Labs Reviewed - No data to display  Imaging Review No results found.   EKG Interpretation None      MDM   Final diagnoses:  None    128 case d/w Dr. Creed CopperJaneway on call for Dr. Julius BowelsPollock.  Labs and Xray reviewed.   No antibiotics at this time.   They will need to tap it in the office to obtain fluid. States swelling likely from [employer]to.  Safe for discharge.  Call office in am to be seen early this week.    Call your pain management specialist in am and Dr. Julius BowelsPollock to be seen.  Patient verbalizes understanding and agrees to follow up   I personally performed the services described in this documentation, which was scribed in my presence. The recorded information has been reviewed and is accurate.    Cy BlamerApril Sahaj Bona, MD 12/31/14 [phone]

## 2014-12-31 ENCOUNTER — Encounter (HOSPITAL_BASED_OUTPATIENT_CLINIC_OR_DEPARTMENT_OTHER): Payer: Self-pay | Admitting: Emergency Medicine

## 2014-12-31 ENCOUNTER — Emergency Department (HOSPITAL_BASED_OUTPATIENT_CLINIC_OR_DEPARTMENT_OTHER): Payer: Medicaid Other

## 2014-12-31 LAB — CBC WITH DIFFERENTIAL/PLATELET
Basophils Absolute: 0 10*3/uL (ref 0.0–0.1)
Basophils Relative: 0 % (ref 0–1)
Eosinophils Absolute: 0.1 10*3/uL (ref 0.0–0.7)
Eosinophils Relative: 1 % (ref 0–5)
HEMATOCRIT: 37.2 % — AB (ref 39.0–52.0)
Hemoglobin: 11.9 g/dL — ABNORMAL LOW (ref 13.0–17.0)
LYMPHS ABS: 2.2 10*3/uL (ref 0.7–4.0)
Lymphocytes Relative: 22 % (ref 12–46)
MCH: 28 pg (ref 26.0–34.0)
MCHC: 32 g/dL (ref 30.0–36.0)
MCV: 87.5 fL (ref 78.0–100.0)
MONO ABS: 0.8 10*3/uL (ref 0.1–1.0)
Monocytes Relative: 8 % (ref 3–12)
Neutro Abs: 6.8 10*3/uL (ref 1.7–7.7)
Neutrophils Relative %: 69 % (ref 43–77)
Platelets: 402 10*3/uL — ABNORMAL HIGH (ref 150–400)
RBC: 4.25 MIL/uL (ref 4.22–5.81)
RDW: 14.8 % (ref 11.5–15.5)
WBC: 9.8 10*3/uL (ref 4.0–10.5)

## 2014-12-31 LAB — BASIC METABOLIC PANEL
Anion gap: 7 (ref 5–15)
BUN: 13 mg/dL (ref 6–20)
CO2: 24 mmol/L (ref 22–32)
Calcium: 8.8 mg/dL — ABNORMAL LOW (ref 8.9–10.3)
Chloride: 108 mmol/L (ref 101–111)
Creatinine, Ser: 1.07 mg/dL (ref 0.61–1.24)
Glucose, Bld: 102 mg/dL — ABNORMAL HIGH (ref 65–99)
POTASSIUM: 3.8 mmol/L (ref 3.5–5.1)
Sodium: 139 mmol/L (ref 135–145)

## 2014-12-31 MED ORDER — MORPHINE SULFATE 4 MG/ML IJ SOLN
4.0000 mg | Freq: Once | INTRAMUSCULAR | Status: AC
  Administered 2014-12-31: 4 mg via INTRAVENOUS
  Filled 2014-12-31: qty 1

## 2014-12-31 MED ORDER — SODIUM CHLORIDE 0.9 % IV BOLUS (SEPSIS)
1000.0000 mL | Freq: Once | INTRAVENOUS | Status: AC
  Administered 2014-12-31: 1000 mL via INTRAVENOUS

## 2014-12-31 MED ORDER — FENTANYL CITRATE (PF) 100 MCG/2ML IJ SOLN
50.0000 ug | Freq: Once | INTRAMUSCULAR | Status: AC
  Administered 2014-12-31: 50 ug via INTRAVENOUS
  Filled 2014-12-31: qty 2

## 2014-12-31 MED ORDER — MORPHINE SULFATE 2 MG/ML IJ SOLN
2.0000 mg | Freq: Once | INTRAMUSCULAR | Status: AC
  Administered 2014-12-31: 2 mg via INTRAVENOUS
  Filled 2014-12-31: qty 1

## 2014-12-31 MED ORDER — KETOROLAC TROMETHAMINE 30 MG/ML IJ SOLN
30.0000 mg | Freq: Once | INTRAMUSCULAR | Status: AC
  Administered 2014-12-31: 30 mg via INTRAVENOUS
  Filled 2014-12-31: qty 1

## 2014-12-31 NOTE — ED Notes (Signed)
CMS intact in BLE, no discoloration noted, calves soft/supple, not tightness, pinpoints pain to L medial and distal knee. H/o DVT & PE, takes xarelto. Pt of pain management clinic.

## 2014-12-31 NOTE — ED Notes (Signed)
Ambulatory at d/c, out in w/c, uses walker (in car), out with son, out to d/c desk, denies needs or questions, pain med given at time of d/c, "feel better".

## 2015-01-02 ENCOUNTER — Encounter (HOSPITAL_BASED_OUTPATIENT_CLINIC_OR_DEPARTMENT_OTHER): Payer: Self-pay | Admitting: *Deleted

## 2015-01-02 ENCOUNTER — Emergency Department (HOSPITAL_BASED_OUTPATIENT_CLINIC_OR_DEPARTMENT_OTHER)
Admission: EM | Admit: 2015-01-02 | Discharge: 2015-01-03 | Disposition: A | Discharge status: Home / Self Care | Payer: Medicaid Other | Attending: Emergency Medicine | Admitting: Emergency Medicine

## 2015-01-02 DIAGNOSIS — Z79899 Other long term (current) drug therapy: Secondary | ICD-10-CM | POA: Diagnosis not present

## 2015-01-02 DIAGNOSIS — E785 Hyperlipidemia, unspecified: Secondary | ICD-10-CM | POA: Insufficient documentation

## 2015-01-02 DIAGNOSIS — I10 Essential (primary) hypertension: Secondary | ICD-10-CM | POA: Insufficient documentation

## 2015-01-02 DIAGNOSIS — G8929 Other chronic pain: Secondary | ICD-10-CM | POA: Insufficient documentation

## 2015-01-02 DIAGNOSIS — Z8711 Personal history of peptic ulcer disease: Secondary | ICD-10-CM | POA: Insufficient documentation

## 2015-01-02 DIAGNOSIS — Z7952 Long term (current) use of systemic steroids: Secondary | ICD-10-CM | POA: Insufficient documentation

## 2015-01-02 DIAGNOSIS — Z792 Long term (current) use of antibiotics: Secondary | ICD-10-CM | POA: Insufficient documentation

## 2015-01-02 DIAGNOSIS — M25562 Pain in left knee: Secondary | ICD-10-CM | POA: Diagnosis not present

## 2015-01-02 DIAGNOSIS — Z96651 Presence of right artificial knee joint: Secondary | ICD-10-CM | POA: Diagnosis not present

## 2015-01-02 MED ORDER — ONDANSETRON 4 MG PO TBDP
4.0000 mg | ORAL_TABLET | Freq: Once | ORAL | Status: AC
  Administered 2015-01-02: 4 mg via ORAL
  Filled 2015-01-02: qty 1

## 2015-01-02 MED ORDER — MORPHINE SULFATE 10 MG/ML IJ SOLN
10.0000 mg | Freq: Once | INTRAMUSCULAR | Status: AC
  Administered 2015-01-02: 10 mg via INTRAMUSCULAR
  Filled 2015-01-02: qty 1

## 2015-01-02 NOTE — ED Notes (Signed)
Pt was seen here 3 days ago for left knee pain s/p total revision. Pt followed up with the surgeons office but pain continues.

## 2015-01-02 NOTE — Discharge Instructions (Signed)
Follow-up with your orthopedic surgeon to discuss possibility of aspiration of the knee.  Apply heat intermittently.  Ace wrap.

## 2015-01-02 NOTE — ED Provider Notes (Signed)
CSN: [id number]     Arrival date & time 01/02/15  2023 History   First MD Initiated Contact with Patient 01/02/15 2034     Chief Complaint  Patient presents with  . Knee Pain     (Consider location/radiation/quality/duration/timing/severity/associated sxs/prior Treatment) Patient is a 59 y.o. male presenting with knee pain. The history is provided by the patient.  Knee Pain Location:  Knee Injury: no   Pain details:    Quality:  Sharp   Radiates to:  L leg   Severity:  Severe   Timing:  Constant   Progression:  Worsening Chronicity:  Recurrent Associated symptoms: no fever     Mr. 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 He was seen in the ED on 12/31/14 for similar pain. He has s/p total knee replacement about three weeks ago by Dr. Julius [facility]elsPollock at [facility]. His pain began about a week ago.  Imaging on 6/6 showed some soft tissue swelling. He has followed up with ortho and they didn't appreciate any fluid on the knee. He chronic opiates and followed by pain clinic. The pain is sharp on the anterior knee. It radiates distally to the proximal tibia.  Nothing has helped the pain. He denies any injury or trauma.  He has done 2 rounds of physical therpay.  He denies any fever, nausea, vomiting, chest pain, SOB, changes in bowel movements or dysuria.    Past Medical History  Diagnosis Date  . Hypertension   . Chronic knee pain   . Hyperlipemia   . PUD (peptic ulcer disease)    Past Surgical History  Procedure Laterality Date  . I&d extremity Left 07/12/2013    Procedure: I&D Left Thigh Abscess;  Surgeon: Toni ArthursJohn [name], MD;  [facility];  Service: Orthopedics;  Laterality: Left;  . Replacement total knee bilateral    . Tonsillectomy    . Hernia repair    . Cholecystectomy    . Appendectomy    . Shoulder surgery    . Elbow surgery     Family History  Problem Relation Age of Onset  . Heart  attack Paternal Grandmother 6855  . Heart disease Mother 2170    arrythmia  . Stroke Paternal Grandfather   . Stroke Maternal Grandfather    History  Substance Use Topics  . Smoking status: Never Smoker   . Smokeless tobacco: Not on file  . Alcohol Use: No    Review of Systems  Constitutional: Negative for fever.  Respiratory: Negative for shortness of breath.   Cardiovascular: Negative for chest pain.  Gastrointestinal: Negative for abdominal pain, diarrhea and constipation.  Genitourinary: Negative for dysuria.  Skin: Negative for color change and rash.      Allergies  Imitrex  Home Medications   Prior to Admission medications   Medication Sig Start Date End Date Taking? Authorizing Provider  atenolol (TENORMIN) 100 MG tablet Take 100 mg by mouth daily.    Historical Provider, MD  atorvastatin (LIPITOR) 80 MG tablet Take 80 mg by mouth daily.    Historical Provider, MD  cephALEXin (KEFLEX) 500 MG capsule Take 1 capsule (500 mg total) by mouth 4 (four) times daily. 10/08/14   Rolland PorterMark James, MD  clonazePAM (KLONOPIN) 2 MG tablet Take 2 mg by mouth 4 (four) times daily.    Historical Provider, MD  diphenhydrAMINE (BENADRYL) 25 MG tablet Take 1 tablet (25 mg total)  by mouth every 6 (six) hours. 10/05/14   Gwyneth Sprout, MD  MORPHINE SULFATE PO Take by mouth.    Historical Provider, MD  oxycodone (ROXICODONE) 30 MG immediate release tablet Take 30 mg by mouth 4 (four) times daily.     Historical Provider, MD  oxymorphone (OPANA ER) 30 MG 12 hr tablet Take 30 mg by mouth every 12 (twelve) hours.    Historical Provider, MD  pantoprazole (PROTONIX) 40 MG tablet Take 1 tablet (40 mg total) by mouth daily. 11/17/13   Kathlen Mody, MD  predniSONE (DELTASONE) 20 MG tablet Take 2 tablets (40 mg total) by mouth daily with breakfast. 10/08/14   Rolland Porter, MD   BP 126/81 mmHg  Pulse 124  Temp(Src) 98.6 F (37 C) (Oral)  Resp 20  Ht  (1.93 m)  Wt 245 lb (111.131 kg)  BMI 29.83 kg/m2   SpO2 99% Physical Exam  Constitutional: He appears well-developed and well-nourished.  HENT:  Head: Normocephalic and atraumatic.  Eyes: Conjunctivae and EOM are normal.  Neck: Normal range of motion.  Cardiovascular: Normal rate, regular rhythm, normal heart sounds and intact distal pulses.   Pulmonary/Chest: Effort normal and breath sounds normal.  Abdominal: Soft. Bowel sounds are normal. There is no tenderness.  Musculoskeletal:  Left knee: well healing incision,  No erythema or ecchymosis.  TTP along medial and lateral joint line  Range of motion limited 2/2 to pain  Pain exacerbated with 90 degree flexion and full extension  No laxity  Neg anterior and posterior drawer  No calf tenderness  Skin: Skin is warm. No rash noted.    ED Course  Procedures (including critical care time) Labs Review Labs Reviewed - No data to display  Imaging Review No results found.   EKG Interpretation None      MDM   Final diagnoses:  None   Mr. Kishimoto is p/w left knee pain s/p left knee total knee replacement. Most likely the pain is 2/2 to effusion in the knee. Will administer pain medication and monitor.    Myra Rude, MD PGY-2, [facility] 01/05/2015, 2:57 PM    Myra Rude, MD 01/05/15 1458  Rolland Porter, MD 01/11/15 [phone]

## 2015-01-02 NOTE — ED Provider Notes (Signed)
Patient seen and evaluated. He's been seen by his orthopedist since his most recent ER visit. Had a negative Doppler ultrasound. Was evaluated by PA. He states he was told by PA with orthopedist office it was "per [place] pressure changes". He had soft tissue swelling that would suggest suprapatellar bursa fluid or blood. On exam here he has diffuse soft tissue swelling about the knee. This does not appear infected. He has not had dehiscence. His wound looks excellent it is not erythematous or tender directly over the incision. In my opinion he simply needs expectant management. Compressive Ace wrap. Warmth or heat. Repeat follow-up to discuss possibility of aspiration.  Rolland PorterMark Emilija Bohman, MD 01/02/15 2306

## 2015-01-07 ENCOUNTER — Emergency Department (HOSPITAL_BASED_OUTPATIENT_CLINIC_OR_DEPARTMENT_OTHER)
Admission: EM | Admit: 2015-01-07 | Discharge: 2015-01-07 | Disposition: A | Discharge status: Home / Self Care | Payer: Medicaid Other | Attending: Emergency Medicine | Admitting: Emergency Medicine

## 2015-01-07 ENCOUNTER — Encounter (HOSPITAL_BASED_OUTPATIENT_CLINIC_OR_DEPARTMENT_OTHER): Payer: Self-pay | Admitting: Emergency Medicine

## 2015-01-07 DIAGNOSIS — M25562 Pain in left knee: Secondary | ICD-10-CM | POA: Diagnosis present

## 2015-01-07 DIAGNOSIS — E785 Hyperlipidemia, unspecified: Secondary | ICD-10-CM | POA: Insufficient documentation

## 2015-01-07 DIAGNOSIS — Z8711 Personal history of peptic ulcer disease: Secondary | ICD-10-CM | POA: Insufficient documentation

## 2015-01-07 DIAGNOSIS — I10 Essential (primary) hypertension: Secondary | ICD-10-CM | POA: Insufficient documentation

## 2015-01-07 DIAGNOSIS — Z792 Long term (current) use of antibiotics: Secondary | ICD-10-CM | POA: Diagnosis not present

## 2015-01-07 DIAGNOSIS — Z7952 Long term (current) use of systemic steroids: Secondary | ICD-10-CM | POA: Diagnosis not present

## 2015-01-07 DIAGNOSIS — G8929 Other chronic pain: Secondary | ICD-10-CM | POA: Insufficient documentation

## 2015-01-07 DIAGNOSIS — Z79899 Other long term (current) drug therapy: Secondary | ICD-10-CM | POA: Diagnosis not present

## 2015-01-07 MED ORDER — HYDROMORPHONE HCL 1 MG/ML IJ SOLN
2.0000 mg | Freq: Once | INTRAMUSCULAR | Status: AC
  Administered 2015-01-07: 2 mg via INTRAMUSCULAR
  Filled 2015-01-07: qty 2

## 2015-01-07 NOTE — Discharge Instructions (Signed)
Follow-up with your orthopedist and pain management doctor.   Knee Pain The knee is the complex joint between your thigh and your lower leg. It is made up of bones, tendons, ligaments, and cartilage. The bones that make up the knee are:  The femur in the thigh.  The tibia and fibula in the lower leg.  The patella or kneecap riding in the groove on the lower femur. CAUSES  Knee pain is a common complaint with many causes. A few of these causes are:  Injury, such as:  A ruptured ligament or tendon injury.  Torn cartilage.  Medical conditions, such as:  Gout  Arthritis  Infections  Overuse, over training, or overdoing a physical activity. Knee pain can be minor or severe. Knee pain can accompany debilitating injury. Minor knee problems often respond well to self-care measures or get well on their own. More serious injuries may need medical intervention or even surgery. SYMPTOMS The knee is complex. Symptoms of knee problems can vary widely. Some of the problems are:  Pain with movement and weight bearing.  Swelling and tenderness.  Buckling of the knee.  Inability to straighten or extend your knee.  Your knee locks and you cannot straighten it.  Warmth and redness with pain and fever.  Deformity or dislocation of the kneecap. DIAGNOSIS  Determining what is wrong may be very straight forward such as when there is an injury. It can also be challenging because of the complexity of the knee. Tests to make a diagnosis may include:  Your caregiver taking a history and doing a physical exam.  Routine X-rays can be used to rule out other problems. X-rays will not reveal a cartilage tear. Some injuries of the knee can be diagnosed by:  Arthroscopy a surgical technique by which a small video camera is inserted through tiny incisions on the sides of the knee. This procedure is used to examine and repair internal knee joint problems. Tiny instruments can be used during  arthroscopy to repair the torn knee cartilage (meniscus).  Arthrography is a radiology technique. A contrast liquid is directly injected into the knee joint. Internal structures of the knee joint then become visible on X-ray film.  An MRI scan is a non X-ray radiology procedure in which magnetic fields and a computer produce two- or three-dimensional images of the inside of the knee. Cartilage tears are often visible using an MRI scanner. MRI scans have largely replaced arthrography in diagnosing cartilage tears of the knee.  Blood work.  Examination of the fluid that helps to lubricate the knee joint (synovial fluid). This is done by taking a sample out using a needle and a syringe. TREATMENT The treatment of knee problems depends on the cause. Some of these treatments are:  Depending on the injury, proper casting, splinting, surgery, or physical therapy care will be needed.  Give yourself adequate recovery time. Do not overuse your joints. If you begin to get sore during workout routines, back off. Slow down or do fewer repetitions.  For repetitive activities such as cycling or running, maintain your strength and nutrition.  Alternate muscle groups. For example, if you are a weight lifter, work the upper body on one day and the lower body the next.  Either tight or weak muscles do not give the proper support for your knee. Tight or weak muscles do not absorb the stress placed on the knee joint. Keep the muscles surrounding the knee strong.  Take care of mechanical problems.  If  you have flat feet, orthotics or special shoes may help. See your caregiver if you need help.  Arch supports, sometimes with wedges on the inner or outer aspect of the heel, can help. These can shift pressure away from the side of the knee most bothered by osteoarthritis.  A brace called an "unloader" brace also may be used to help ease the pressure on the most arthritic side of the knee.  If your caregiver has  prescribed crutches, braces, wraps or ice, use as directed. The acronym for this is PRICE. This means protection, rest, ice, compression, and elevation.  Nonsteroidal anti-inflammatory drugs (NSAIDs), can help relieve pain. But if taken immediately after an injury, they may actually increase swelling. Take NSAIDs with food in your stomach. Stop them if you develop stomach problems. Do not take these if you have a history of ulcers, stomach pain, or bleeding from the bowel. Do not take without your caregiver's approval if you have problems with fluid retention, heart failure, or kidney problems.  For ongoing knee problems, physical therapy may be helpful.  Glucosamine and chondroitin are over-the-counter dietary supplements. Both may help relieve the pain of osteoarthritis in the knee. These medicines are different from the usual anti-inflammatory drugs. Glucosamine may decrease the rate of cartilage destruction.  Injections of a corticosteroid drug into your knee joint may help reduce the symptoms of an arthritis flare-up. They may provide pain relief that lasts a few months. You may have to wait a few months between injections. The injections do have a small increased risk of infection, water retention, and elevated blood sugar levels.  Hyaluronic acid injected into damaged joints may ease pain and provide lubrication. These injections may work by reducing inflammation. A series of shots may give relief for as long as 6 months.  Topical painkillers. Applying certain ointments to your skin may help relieve the pain and stiffness of osteoarthritis. Ask your pharmacist for suggestions. Many over the-counter products are approved for temporary relief of arthritis pain.  In some countries, doctors often prescribe topical NSAIDs for relief of chronic conditions such as arthritis and tendinitis. A review of treatment with NSAID creams found that they worked as well as oral medications but without the serious  side effects. PREVENTION  Maintain a healthy weight. Extra pounds put more strain on your joints.  Get strong, stay limber. Weak muscles are a common cause of knee injuries. Stretching is important. Include flexibility exercises in your workouts.  Be smart about exercise. If you have osteoarthritis, chronic knee pain or recurring injuries, you may need to change the way you exercise. This does not mean you have to stop being active. If your knees ache after jogging or playing basketball, consider switching to swimming, water aerobics, or other low-impact activities, at least for a few days a week. Sometimes limiting high-impact activities will provide relief.  Make sure your shoes fit well. Choose footwear that is right for your sport.  Protect your knees. Use the proper gear for knee-sensitive activities. Use kneepads when playing volleyball or laying carpet. Buckle your seat belt every time you drive. Most shattered kneecaps occur in car accidents.  Rest when you are tired. SEEK MEDICAL CARE IF:  You have knee pain that is continual and does not seem to be getting better.  SEEK IMMEDIATE MEDICAL CARE IF:  Your knee joint feels hot to the touch and you have a high fever. MAKE SURE YOU:   Understand these instructions.  Will watch your condition.  Will get help right away if you are not doing well or get worse. Document Released: 05/10/2007 Document Revised: 10/05/2011 Document Reviewed: 05/10/2007 [facility] Patient Information 2015 [place], [place]. This information is not intended to replace advice given to you by your health care provider. Make sure you discuss any questions you have with your health care provider.

## 2015-01-07 NOTE — ED Provider Notes (Signed)
CSN: [id number]     Arrival date & time 01/07/15  1825 History  This chart was scribed for Geoffery Lyons, MD by Lyndel Safe, ED Scribe. This patient was seen in room MH04/MH04 and the patient's care was started 6:52 PM.   Chief Complaint  Patient presents with  . Knee Pain   HPI HPI Comments: [name] is a 59 y.o. male who presents to the Emergency Department complaining of constant, moderate, 8/10 left knee pain that has been going ever since he had a knee replacement several weeks ago. His orthopedic surgeon is Abran Duke, MD with [facility] who he has an appt with on Friday in 5 days. He has had a previous knee replacement on his right knee and reports the pain started to subside after 2 weeks but the current pain in his left knee has lingered longer. He notes he is currently a pain management pt and has been for 10 years due to knee pain and arthritis, taking 30 mg oxycodone daily. He denies fevers, warmth to the left knee.    Past Medical History  Diagnosis Date  . Hypertension   . Chronic knee pain   . Hyperlipemia   . PUD (peptic ulcer disease)    Past Surgical History  Procedure Laterality Date  . I&d extremity Left 07/12/2013    Procedure: I&D Left Thigh Abscess;  Surgeon: [name], MD;  Location: [facility] OR;  Service: Orthopedics;  Laterality: Left;  . Replacement total knee bilateral    . Tonsillectomy    . Hernia repair    . Cholecystectomy    . Appendectomy    . Shoulder surgery    . Elbow surgery     Family History  Problem Relation Age of Onset  . Heart attack Paternal Grandmother 14  . Heart disease Mother 61    arrythmia  . Stroke Paternal Grandfather   . Stroke Maternal Grandfather    History  Substance Use Topics  . Smoking status: Never Smoker   . Smokeless tobacco: Not on file  . Alcohol Use: No    Review of Systems  Constitutional: Negative for fever.  Musculoskeletal: Positive for myalgias and arthralgias ( left knee).  Skin: Negative  for color change.   A complete 10 system review of systems was obtained and is otherwise negative except at noted in the HPI and PMH.    Allergies  Imitrex and Verapamil  Home Medications   Prior to Admission medications   Medication Sig Start Date End Date Taking? Authorizing Provider  atenolol (TENORMIN) 100 MG tablet Take 100 mg by mouth daily.    Historical Provider, MD  atorvastatin (LIPITOR) 80 MG tablet Take 80 mg by mouth daily.    Historical Provider, MD  cephALEXin (KEFLEX) 500 MG capsule Take 1 capsule (500 mg total) by mouth 4 (four) times daily. 10/08/14   Rolland Porter, MD  clonazePAM (KLONOPIN) 2 MG tablet Take 2 mg by mouth 4 (four) times daily.    Historical Provider, MD  diphenhydrAMINE (BENADRYL) 25 MG tablet Take 1 tablet (25 mg total) by mouth every 6 (six) hours. 10/05/14   Gwyneth Sprout, MD  MORPHINE SULFATE PO Take by mouth.    Historical Provider, MD  oxycodone (ROXICODONE) 30 MG immediate release tablet Take 30 mg by mouth 4 (four) times daily.     Historical Provider, MD  oxymorphone (OPANA ER) 30 MG 12 hr tablet Take 30 mg by mouth every 12 (twelve) hours.    Historical Provider,  MD  pantoprazole (PROTONIX) 40 MG tablet Take 1 tablet (40 mg total) by mouth daily. 11/17/13   Kathlen Mody, MD  predniSONE (DELTASONE) 20 MG tablet Take 2 tablets (40 mg total) by mouth daily with breakfast. 10/08/14   Rolland Porter, MD   BP 136/78 mmHg  Pulse 111  Temp(Src) 98.3 F (36.8 C) (Oral)  Resp 16  Ht 6\' 4"  (1.93 m)  Wt 245 lb (111.131 kg)  BMI 29.83 kg/m2  SpO2 100%  Physical Exam  Constitutional: He is oriented to person, place, and time. He appears well-developed and well-nourished. No distress.  HENT:  Head: Normocephalic.  Right Ear: External ear normal.  Left Ear: External ear normal.  Mouth/Throat: No oropharyngeal exudate.  Eyes: Pupils are equal, round, and reactive to light. Right eye exhibits no discharge. Left eye exhibits no discharge. No scleral icterus.   Neck: No JVD present.  Cardiovascular: Normal rate, regular rhythm and normal heart sounds.   Pulmonary/Chest: Effort normal and breath sounds normal. No respiratory distress.  Musculoskeletal: He exhibits no edema.  Left knee has a several week old surgical incision present. There is no redness, warmth, or effusion palpable.  He has pain with ROM, but the knee appears stable AP and laterally. Distal PMS intact.   Lymphadenopathy:    He has no cervical adenopathy.  Neurological: He is alert and oriented to person, place, and time.  Skin: Skin is warm. No rash noted. No erythema. No pallor.  Psychiatric: He has a normal mood and affect. His behavior is normal.  Nursing note and vitals reviewed.   ED Course  Procedures  DIAGNOSTIC STUDIES: Oxygen Saturation is 100% on RA, normal by my interpretation.    COORDINATION OF CARE: 6:59 PM Discussed treatment plan which with pt. Pt acknowledges and agrees to plan.   Labs Review Labs Reviewed - No data to display  Imaging Review No results found.   EKG Interpretation None      MDM   Final diagnoses:  None    Patient having ongoing knee pain following a total knee replacement. He has been here several times for it the same. He is under a pain contract and will be offered no prescriptions. He was given an IM injection of Dilaudid and is feeling better. He will be discharged, to follow-up with his surgeon to discuss. I see no significant swelling, redness, or other emergent findings.  I personally performed the services described in this documentation, which was scribed in my presence. The recorded information has been reviewed and is accurate.      Geoffery Lyons, MD 01/08/15 1321

## 2015-02-25 ENCOUNTER — Emergency Department (HOSPITAL_COMMUNITY)
Admission: EM | Admit: 2015-02-25 | Discharge: 2015-02-25 | Disposition: A | Discharge status: Home / Self Care | Payer: Medicaid Other | Attending: Emergency Medicine | Admitting: Emergency Medicine

## 2015-02-25 ENCOUNTER — Emergency Department (HOSPITAL_COMMUNITY): Payer: Medicaid Other

## 2015-02-25 ENCOUNTER — Encounter (HOSPITAL_COMMUNITY): Payer: Self-pay | Admitting: Nurse Practitioner

## 2015-02-25 DIAGNOSIS — Z8711 Personal history of peptic ulcer disease: Secondary | ICD-10-CM | POA: Insufficient documentation

## 2015-02-25 DIAGNOSIS — I10 Essential (primary) hypertension: Secondary | ICD-10-CM | POA: Diagnosis not present

## 2015-02-25 DIAGNOSIS — G8929 Other chronic pain: Secondary | ICD-10-CM | POA: Insufficient documentation

## 2015-02-25 DIAGNOSIS — E785 Hyperlipidemia, unspecified: Secondary | ICD-10-CM | POA: Insufficient documentation

## 2015-02-25 DIAGNOSIS — R079 Chest pain, unspecified: Secondary | ICD-10-CM | POA: Insufficient documentation

## 2015-02-25 DIAGNOSIS — Z720 Tobacco use: Secondary | ICD-10-CM | POA: Diagnosis not present

## 2015-02-25 LAB — I-STAT TROPONIN, ED
Troponin i, poc: 0 ng/mL (ref 0.00–0.08)
Troponin i, poc: 0 ng/mL (ref 0.00–0.08)

## 2015-02-25 LAB — CBC
HCT: 41.2 % (ref 39.0–52.0)
Hemoglobin: 13.2 g/dL (ref 13.0–17.0)
MCH: 27.1 pg (ref 26.0–34.0)
MCHC: 32 g/dL (ref 30.0–36.0)
MCV: 84.6 fL (ref 78.0–100.0)
Platelets: 240 10*3/uL (ref 150–400)
RBC: 4.87 MIL/uL (ref 4.22–5.81)
RDW: 14.5 % (ref 11.5–15.5)
WBC: 6 10*3/uL (ref 4.0–10.5)

## 2015-02-25 LAB — BASIC METABOLIC PANEL
Anion gap: 8 (ref 5–15)
BUN: 11 mg/dL (ref 6–20)
CO2: 25 mmol/L (ref 22–32)
Calcium: 9.1 mg/dL (ref 8.9–10.3)
Chloride: 104 mmol/L (ref 101–111)
Creatinine, Ser: 1.08 mg/dL (ref 0.61–1.24)
GFR calc Af Amer: >60 mL/min (ref >60)
GFR calc non Af Amer: >60 mL/min (ref >60)
Glucose, Bld: 102 mg/dL — ABNORMAL HIGH (ref 65–99)
Potassium: 4.1 mmol/L (ref 3.5–5.1)
Sodium: 137 mmol/L (ref 135–145)

## 2015-02-25 LAB — D-DIMER, QUANTITATIVE (NOT AT ARMC): D DIMER QUANT: 2.55 ug{FEU}/mL — AB (ref 0.00–0.48)

## 2015-02-25 MED ORDER — SODIUM CHLORIDE 0.9 % IV BOLUS (SEPSIS)
1000.0000 mL | Freq: Once | INTRAVENOUS | Status: AC
  Administered 2015-02-25: 1000 mL via INTRAVENOUS

## 2015-02-25 MED ORDER — MORPHINE SULFATE 4 MG/ML IJ SOLN
4.0000 mg | Freq: Once | INTRAMUSCULAR | Status: AC
  Administered 2015-02-25: 4 mg via INTRAVENOUS
  Filled 2015-02-25: qty 1

## 2015-02-25 MED ORDER — ASPIRIN 81 MG PO CHEW
324.0000 mg | CHEWABLE_TABLET | Freq: Once | ORAL | Status: AC
  Administered 2015-02-25: 324 mg via ORAL
  Filled 2015-02-25: qty 4

## 2015-02-25 MED ORDER — DIPHENHYDRAMINE HCL 50 MG/ML IJ SOLN
12.5000 mg | Freq: Once | INTRAMUSCULAR | Status: AC
  Administered 2015-02-25: 12.5 mg via INTRAVENOUS
  Filled 2015-02-25: qty 1

## 2015-02-25 MED ORDER — COLCHICINE 0.6 MG PO TABS: 0.6000 mg | ORAL_TABLET | Freq: Two times a day (BID) | ORAL | Status: DC

## 2015-02-25 MED ORDER — PROCHLORPERAZINE EDISYLATE 5 MG/ML IJ SOLN
10.0000 mg | Freq: Once | INTRAMUSCULAR | Status: AC
  Administered 2015-02-25: 10 mg via INTRAVENOUS
  Filled 2015-02-25: qty 2

## 2015-02-25 MED ORDER — OXYCODONE-ACETAMINOPHEN 5-325 MG PO TABS: 1.0000 | ORAL_TABLET | Freq: Four times a day (QID) | ORAL | Status: DC | PRN

## 2015-02-25 MED ORDER — NITROGLYCERIN 0.4 MG SL SUBL
0.4000 mg | SUBLINGUAL_TABLET | SUBLINGUAL | Status: DC | PRN
  Administered 2015-02-25 (×2): 0.4 mg via SUBLINGUAL
  Filled 2015-02-25 (×2): qty 1

## 2015-02-25 MED ORDER — HYDROMORPHONE HCL 1 MG/ML IJ SOLN
1.0000 mg | Freq: Once | INTRAMUSCULAR | Status: AC
  Administered 2015-02-25: 1 mg via INTRAVENOUS
  Filled 2015-02-25: qty 1

## 2015-02-25 MED ORDER — IBUPROFEN 800 MG PO TABS: 800.0000 mg | ORAL_TABLET | Freq: Three times a day (TID) | ORAL | Status: DC

## 2015-02-25 MED ORDER — DEXAMETHASONE SODIUM PHOSPHATE 10 MG/ML IJ SOLN
10.0000 mg | Freq: Once | INTRAMUSCULAR | Status: AC
  Administered 2015-02-25: 10 mg via INTRAVENOUS
  Filled 2015-02-25: qty 1

## 2015-02-25 MED ORDER — IOHEXOL 350 MG/ML SOLN
100.0000 mL | Freq: Once | INTRAVENOUS | Status: AC | PRN
  Administered 2015-02-25: 100 mL via INTRAVENOUS

## 2015-02-25 NOTE — ED Provider Notes (Signed)
History   Chief Complaint  Patient presents with  . Chest Pain    HPI 59 year old male with past medical history of hypertension, peptic ulcer disease, who presents to ED for chest pain evaluation. Patient reports pain can yesterday while walking around his house. Onset was gradual. Location: midsternal and L chest. Patient describes pain as sharp and pleuritic, not worse with exertion. Pain persisted throughout the night and never let up. Pain did wax and wane. Patient reports having mild diaphoresis and nausea but denies shortness of breath, vomiting or other symptoms. Prior to this patient was in his usual state of health. Denies recent illness. Denies fevers, chills, cough, leg pain, leg swelling, history of DVT or PE. Exertion seems to aggravate his pain. Leaning forward and arrest seem to improve his pain.  Hx of similar symptoms: No.     Past medical/surgical history, social history, medications, allergies and FH have been reviewed with patient and/or in documentation.  Past Medical History  Diagnosis Date  . Hypertension   . Chronic knee pain   . Hyperlipemia   . PUD (peptic ulcer disease)    Past Surgical History  Procedure Laterality Date  . I&d extremity Left 07/12/2013    Procedure: I&D Left Thigh Abscess;  Surgeon: [name], MD;  Location: [facility] OR;  Service: Orthopedics;  Laterality: Left;  . Replacement total knee bilateral    . Tonsillectomy    . Hernia repair    . Cholecystectomy    . Appendectomy    . Shoulder surgery    . Elbow surgery     Family History  Problem Relation Age of Onset  . Heart attack Paternal Grandmother 16  . Heart disease Mother 47    arrythmia  . Stroke Paternal Grandfather   . Stroke Maternal Grandfather    History  Substance Use Topics  . Smoking status: Current Every Day Smoker    Types: Cigarettes  . Smokeless tobacco: Not on file  . Alcohol Use: No    Review of Systems Constitutional: Negative for fever, chills and  fatigue. + diaphoresis HENT: Negative for congestion, rhinorrhea and sore throat.   Eyes: Negative for visual disturbance.  Respiratory: - SOB, Negative for cough and wheezing.   Cardiovascular: + for CP.  Gastrointestinal: + nausea, - vomiting; denies abdominal pain and diarrhea.  Genitourinary: Negative for flank pain, dysuria, frequency.  Musculoskeletal: Negative for back pain, neck pain and neck stiffness, leg pain/swelling.  Skin: Negative for rash.  Neurological: Negative for dizziness and headaches.  All other systems reviewed and are negative.   Physical Exam  Physical Exam  ED Triage Vitals  Enc Vitals Group     BP --      Pulse --      Resp --      Temp --      Temp src --      SpO2 --      Weight 02/25/15 1621 240 lb (108.863 kg)     Height 02/25/15 1621 6\' 3"  (1.905 m)     Head Cir --      Peak Flow --      Pain Score 02/25/15 1620 7     Pain Loc --      Pain Edu? --      Excl. in GC? --    Constitutional: Patient is well-appearing in mild acute distress Head: Normocephalic and atraumatic.  Eyes: Extraocular motion intact, no scleral icterus Neck: Supple without meningismus, mass, or overt JVD.  Respiratory: Effort normal and breath sounds normal. No respiratory distress. Chest wall: reproducible CP on exam CV: Heart regular rate and rhythm, no obvious murmurs.  Pulses +2 and symmetric. Euvolemic on exam. Abdomen: Soft, non-tender, non-distended MSK: Extremities are atraumatic without deformity, ROM intact. No calf TTP. Skin: Warm, dry, intact. Neuro: Alert and oriented, no motor deficit noted Psychiatric: Mood and affect are normal.    ED Course  Procedures   Labs Reviewed  BASIC METABOLIC PANEL - Abnormal; Notable for the following:    Glucose, Bld 102 (*)    All other components within normal limits  D-DIMER, QUANTITATIVE (NOT AT [facility]) - Abnormal; Notable for the following:    D-Dimer, Quant 2.55 (*)    All other components within normal  limits  CBC  I-STAT TROPOININ, ED  I-STAT TROPOININ, ED   I personally reviewed and interpreted all labs.  DG Chest 2 View    (Results Pending)   I personally viewed above image(s) which were used in my medical decision making. Formal interpretations by Radiology.  MDM: BREON REHM is a 59 y.o. male who p/w CP. ABCs intact. HDS, NAD. Cardiac w/u initiated. 324 mg ASA given in ED, sl NTG given.  EKG personally review by myself and showed: Rate of 127. Intervals / duration PR 0.166 sec., QRS 0.094 sec., and QTc 0.427 sec. Axis degrees -40. Clinical impression: sinus tachycardia, slight PR depression, No STE/STD. unchanged from prior. Indication: CP   Low risk HEAR; plan for delta Tn.  Pain does not radiate to back, blood pressure is stable. No mediastinal widening on CXR. Dissection unlikely. Pt's breath sounds are equal bilaterally. No PTX on CXR. PTX unlikely. Pain is not associated with meals. GERD unlikely. Pain is not positional and EKG is WNL. Pericarditis unlikely. No h/o cocaine use. Pt reports no cough or fever. No infiltrate on CXR. PNA unlikely. No Hx of exertion, trauma. Pain is non-reproducible. Costochondritis unlikely.  Risk: smoker, HTN, HLD  Previous w/u: prior old neg stress Pt has Wells score of 1.5 (HR), dimer sent. Labs notable for elev dimer. PE study ordered.  4:55 PM Pain not improved with NTG. Migraine after nitro. HA cocktail given.  HA resolved after cocktail. CP improved after dilaudid.  Delta Tn neg, ACS ruled out. PE study neg.  Given pr dep on EKG and pain relief with leaning fwd, will tx for pericarditis with NSAIDs, colchicine and have f/u closely with PCP. Stable for d/c.  Clinical Impression: 1. Chest pain, rule out acute myocardial infarction     Disposition: Discharge  Condition: Good  I have discussed the results, Dx and Tx plan with the pt(& family if present). He/she/they expressed understanding and agree(s) with the plan. Discharge  instructions discussed at great length. Strict return precautions discussed and pt &/or family have verbalized understanding of the instructions. No further questions at time of discharge.    Discharge Medication List as of 02/25/2015  9:42 PM    START taking these medications   Details  colchicine 0.6 MG tablet Take 1 tablet (0.6 mg total) by mouth 2 (two) times daily., Starting 02/25/2015, Until Discontinued, Print    ibuprofen (ADVIL,MOTRIN) 800 MG tablet Take 1 tablet (800 mg total) by mouth 3 (three) times daily., Starting 02/25/2015, Until Discontinued, Print    oxyCODONE-acetaminophen (PERCOCET/ROXICET) 5-325 MG per tablet Take 1 tablet by mouth every 6 (six) hours as needed for severe pain., Starting 02/25/2015, Until Discontinued, Print        Follow  Up: [name], MD [address] Internal Med--[place] [address] [phone]  Schedule an appointment as soon as possible for a visit in 2 days   [facility] EMERGENCY DEPARTMENT [address] [id number] mc [place] [address] [phone]  If symptoms worsen   Pt seen in conjunction with Dr. Graylon Good, DO [facility] Emergency Medicine Resident - PGY-3           Ames Dura, MD 02/26/15 Moses Manners  Raeford Razor, MD 02/27/15 [phone]

## 2015-02-25 NOTE — ED Notes (Signed)
CT notified pt ready for transport

## 2015-02-25 NOTE — ED Provider Notes (Signed)
I saw and evaluated the patient, reviewed the resident's note and I agree with the findings and plan.   EKG Interpretation   Date/Time:  Monday February 25 2015 16:13:25 EDT Ventricular Rate:  127 PR Interval:  166 QRS Duration: 94 QT Interval:  294 QTC Calculation: 427 R Axis:   -40 Text Interpretation:  Sinus tachycardia Non-specific ST-t changes  Confirmed by Juleen China  MD, Antolin Belsito ([id number]) on 02/25/2015 4:23:08 PM      58yM with CP. Constant since last night. Substernal to L chest. Worse with deep breaths and increased activity. No SOB. No cough. No unusual leg pain or swelling. On exam, he appears uncomfortable. Lungs clear. Heart RRR. No murmur. I cannot appreciate a rub. Doubt ACS. Some atypical symptoms. Trop normal x2. CT neg for PE. Pericarditis? Does report positional. No rub on exam. No significant pericardial effusion on imaging. No recent URI symptoms. Concerns enough to tx as such though. Afebrile. Nontoxic. Low suspicion for emergent process.   Craig Razor, MD 02/27/15 [phone]

## 2015-02-25 NOTE — ED Notes (Signed)
Patient transported to X-ray 

## 2015-02-25 NOTE — ED Notes (Signed)
He c/o 1 day history misternal CP radiating into L side of chest with nausea and SOB. Rest relieves the symptoms, exertion increase the symptoms. He i sA&Ox4, resp e/u

## 2015-02-25 NOTE — ED Notes (Signed)
Pt in CT.

## 2015-02-25 NOTE — ED Notes (Signed)
Pt able to dress self and ambulate to wheelchair independently.

## 2015-02-25 NOTE — Discharge Instructions (Signed)

## 2015-03-01 ENCOUNTER — Emergency Department (HOSPITAL_BASED_OUTPATIENT_CLINIC_OR_DEPARTMENT_OTHER)
Admission: EM | Admit: 2015-03-01 | Discharge: 2015-03-01 | Disposition: A | Discharge status: Home / Self Care | Payer: Medicaid Other | Attending: Emergency Medicine | Admitting: Emergency Medicine

## 2015-03-01 ENCOUNTER — Emergency Department (HOSPITAL_BASED_OUTPATIENT_CLINIC_OR_DEPARTMENT_OTHER): Payer: Medicaid Other

## 2015-03-01 ENCOUNTER — Encounter (HOSPITAL_BASED_OUTPATIENT_CLINIC_OR_DEPARTMENT_OTHER): Payer: Self-pay | Admitting: Emergency Medicine

## 2015-03-01 DIAGNOSIS — Z79899 Other long term (current) drug therapy: Secondary | ICD-10-CM | POA: Diagnosis not present

## 2015-03-01 DIAGNOSIS — I319 Disease of pericardium, unspecified: Secondary | ICD-10-CM | POA: Diagnosis not present

## 2015-03-01 DIAGNOSIS — G8929 Other chronic pain: Secondary | ICD-10-CM | POA: Insufficient documentation

## 2015-03-01 DIAGNOSIS — I10 Essential (primary) hypertension: Secondary | ICD-10-CM | POA: Insufficient documentation

## 2015-03-01 DIAGNOSIS — Z8639 Personal history of other endocrine, nutritional and metabolic disease: Secondary | ICD-10-CM | POA: Diagnosis not present

## 2015-03-01 DIAGNOSIS — Z87891 Personal history of nicotine dependence: Secondary | ICD-10-CM | POA: Diagnosis not present

## 2015-03-01 DIAGNOSIS — R079 Chest pain, unspecified: Secondary | ICD-10-CM | POA: Diagnosis present

## 2015-03-01 DIAGNOSIS — Z8711 Personal history of peptic ulcer disease: Secondary | ICD-10-CM | POA: Insufficient documentation

## 2015-03-01 DIAGNOSIS — R071 Chest pain on breathing: Secondary | ICD-10-CM | POA: Insufficient documentation

## 2015-03-01 LAB — CBC WITH DIFFERENTIAL/PLATELET
Basophils Absolute: 0 K/uL (ref 0.0–0.1)
Basophils Relative: 0 % (ref 0–1)
Eosinophils Absolute: 0.1 K/uL (ref 0.0–0.7)
Eosinophils Relative: 1 % (ref 0–5)
HCT: 41.5 % (ref 39.0–52.0)
Hemoglobin: 13.6 g/dL (ref 13.0–17.0)
Lymphocytes Relative: 12 % (ref 12–46)
Lymphs Abs: 1.2 K/uL (ref 0.7–4.0)
MCH: 27.3 pg (ref 26.0–34.0)
MCHC: 32.8 g/dL (ref 30.0–36.0)
MCV: 83.2 fL (ref 78.0–100.0)
Monocytes Absolute: 1 K/uL (ref 0.1–1.0)
Monocytes Relative: 10 % (ref 3–12)
Neutro Abs: 7.6 K/uL (ref 1.7–7.7)
Neutrophils Relative %: 77 % (ref 43–77)
Platelets: 255 K/uL (ref 150–400)
RBC: 4.99 MIL/uL (ref 4.22–5.81)
RDW: 14.5 % (ref 11.5–15.5)
WBC: 9.8 K/uL (ref 4.0–10.5)

## 2015-03-01 LAB — BASIC METABOLIC PANEL WITH GFR
Anion gap: 9 (ref 5–15)
BUN: 9 mg/dL (ref 6–20)
CO2: 23 mmol/L (ref 22–32)
Calcium: 8.8 mg/dL — ABNORMAL LOW (ref 8.9–10.3)
Chloride: 106 mmol/L (ref 101–111)
Creatinine, Ser: 0.91 mg/dL (ref 0.61–1.24)
GFR calc Af Amer: >60 mL/min
GFR calc non Af Amer: >60 mL/min
Glucose, Bld: 100 mg/dL — ABNORMAL HIGH (ref 65–99)
Potassium: 3.9 mmol/L (ref 3.5–5.1)
Sodium: 138 mmol/L (ref 135–145)

## 2015-03-01 LAB — TROPONIN I: Troponin I: <0.03 ng/mL

## 2015-03-01 MED ORDER — HYDROMORPHONE HCL 1 MG/ML IJ SOLN
1.0000 mg | Freq: Once | INTRAMUSCULAR | Status: AC
  Administered 2015-03-01: 1 mg via INTRAVENOUS
  Filled 2015-03-01: qty 1

## 2015-03-01 MED ORDER — COLCHICINE 0.6 MG PO TABS
0.6000 mg | ORAL_TABLET | Freq: Once | ORAL | Status: AC
  Administered 2015-03-01: 0.6 mg via ORAL
  Filled 2015-03-01: qty 1

## 2015-03-01 MED ORDER — OXYCODONE-ACETAMINOPHEN 5-325 MG PO TABS
2.0000 | ORAL_TABLET | Freq: Once | ORAL | Status: AC
  Administered 2015-03-01: 2 via ORAL
  Filled 2015-03-01: qty 2

## 2015-03-01 MED ORDER — KETOROLAC TROMETHAMINE 30 MG/ML IJ SOLN
30.0000 mg | Freq: Once | INTRAMUSCULAR | Status: AC
  Administered 2015-03-01: 30 mg via INTRAVENOUS
  Filled 2015-03-01: qty 1

## 2015-03-01 MED ORDER — SODIUM CHLORIDE 0.9 % IV BOLUS (SEPSIS)
1000.0000 mL | Freq: Once | INTRAVENOUS | Status: AC
Start: 2015-03-01 — End: 2015-03-01
  Administered 2015-03-01: 1000 mL via INTRAVENOUS

## 2015-03-01 NOTE — ED Provider Notes (Addendum)
CSN: [id number]     Arrival date & time 03/01/15  1534 History   First MD Initiated Contact with Patient 03/01/15 1553     Chief Complaint  Patient presents with  . Chest Pain     (Consider location/radiation/quality/duration/timing/severity/associated sxs/prior Treatment) HPI 59 year old male who presents with chest pain. He has a history of hypertension, peptic ulcer disease. He was recently seen in the emergency department 02/25/2015 for evaluation of pleuritic chest pain. He was ruled out for ACS and was noted to have a low heart score. He also received CT PE and ruled out for pulmonary embolism. He was diagnosed with likely pericarditis given the nature of his pain and that it worsens with lying back. He was discharged with colchicine and NSAIDs. He reports that after taking colchicine for a day, he stopped taking this medication as it causes him to have diarrhea that he did not like. He is taken home NSAIDs as well as home morphine, and oxycodone. He continues to have pleuritic chest pain and was concerned that his symptoms may be worsening. He denies any dyspnea on exertion, lower extremity edema, orthopnea, PND, fevers, chills. He denies any recent fevers, chills, nausea, vomiting, abdominal pain, cough, congestion, sore throat, runny nose. Past Medical History  Diagnosis Date  . Hypertension   . Chronic knee pain   . Hyperlipemia   . PUD (peptic ulcer disease)    Past Surgical History  Procedure Laterality Date  . I&d extremity Left 07/12/2013    Procedure: I&D Left Thigh Abscess;  Surgeon: [name], MD;  Location: [facility] OR;  Service: Orthopedics;  Laterality: Left;  . Replacement total knee bilateral    . Tonsillectomy    . Hernia repair    . Cholecystectomy    . Appendectomy    . Shoulder surgery    . Elbow surgery     Family History  Problem Relation Age of Onset  . Heart attack Paternal Grandmother 43  . Heart disease Mother 73    arrythmia  . Stroke Paternal  Grandfather   . Stroke Maternal Grandfather    History  Substance Use Topics  . Smoking status: Former Smoker    Types: Cigarettes  . Smokeless tobacco: Not on file  . Alcohol Use: No    Review of Systems  10/14 systems reviewed and are negative other than those stated in the HPI   Allergies  Imitrex and Verapamil  Home Medications   Prior to Admission medications   Medication Sig Start Date End Date Taking? Authorizing Provider  albuterol (PROAIR HFA) 108 (90 BASE) MCG/ACT inhaler Inhale 1 puff into the lungs every 6 (six) hours as needed for wheezing or shortness of breath.  09/06/13   Historical Provider, MD  atenolol (TENORMIN) 100 MG tablet Take 100 mg by mouth daily.    Historical Provider, MD  atorvastatin (LIPITOR) 80 MG tablet Take 80 mg by mouth daily.    Historical Provider, MD  cephALEXin (KEFLEX) 500 MG capsule Take 1 capsule (500 mg total) by mouth 4 (four) times daily. Patient not taking: Reported on 02/25/2015 10/08/14   Rolland Porter, MD  clonazePAM (KLONOPIN) 2 MG tablet Take 2 mg by mouth 4 (four) times daily.    Historical Provider, MD  colchicine 0.6 MG tablet Take 1 tablet (0.6 mg total) by mouth 2 (two) times daily. 02/25/15   Ames Dura, MD  Diclofenac-Misoprostol 75-0.2 MG TBEC Take 1 tablet by mouth 2 (two) times daily. 02/21/15   Historical Provider, MD  diphenhydrAMINE (BENADRYL) 25 MG tablet Take 1 tablet (25 mg total) by mouth every 6 (six) hours. Patient not taking: Reported on 02/25/2015 10/05/14   Gwyneth Sprout, MD  famciclovir (FAMVIR) 500 MG tablet Take 500 mg by mouth 2 (two) times daily as needed (outbreak).  10/25/13   Historical Provider, MD  ibuprofen (ADVIL,MOTRIN) 800 MG tablet Take 1 tablet (800 mg total) by mouth 3 (three) times daily. 02/25/15   Ames Dura, MD  morphine (MS CONTIN) 30 MG 12 hr tablet Take 30 mg by mouth 2 (two) times daily. 02/11/15   Historical Provider, MD  morphine (MS CONTIN) 60 MG 12 hr tablet Take 60 mg by mouth 2 (two)  times daily. 02/11/15   Historical Provider, MD  naloxegol oxalate (MOVANTIK) 25 MG TABS tablet Take 25 mg by mouth 2 (two) times daily. 09/18/14   Historical Provider, MD  ondansetron (ZOFRAN) 8 MG tablet Take 8 mg by mouth daily as needed for nausea or vomiting.  12/25/14   Historical Provider, MD  oxycodone (ROXICODONE) 30 MG immediate release tablet Take 30 mg by mouth 4 (four) times daily.     Historical Provider, MD  oxyCODONE-acetaminophen (PERCOCET/ROXICET) 5-325 MG per tablet Take 1 tablet by mouth every 6 (six) hours as needed for severe pain. 02/25/15   Ames Dura, MD  pantoprazole (PROTONIX) 40 MG tablet Take 1 tablet (40 mg total) by mouth daily. Patient taking differently: Take 40 mg by mouth daily as needed (heartburn).  11/17/13   Kathlen Mody, MD  predniSONE (DELTASONE) 20 MG tablet Take 2 tablets (40 mg total) by mouth daily with breakfast. Patient not taking: Reported on 02/25/2015 10/08/14   Rolland Porter, MD  SUMAtriptan (IMITREX) 50 MG tablet Take 50 mg by mouth daily as needed for migraine or headache.  02/15/15   Historical Provider, MD   BP 139/89 mmHg  Pulse 80  Temp(Src) 98.2 F (36.8 C) (Oral)  Resp 15  Ht 6\' 3"  (1.905 m)  Wt 245 lb (111.131 kg)  BMI 30.62 kg/m2  SpO2 100% Physical Exam  Nursing note and vitals reviewed. Physical Exam  Constitutional: Well developed, well nourished, non-toxic, and in no acute distress. Head: Normocephalic and atraumatic.  Mouth/Throat: Oropharynx is clear and moist.  Neck: Normal range of motion. Neck supple.  Cardiovascular: Normal rate and regular rhythm.  No appreciable murmur or rub. No edema. Normal capillary refill. Pulmonary/Chest: Effort normal and breath sounds normal. No chest wall tenderness. Abdominal: Soft. There is no tenderness. There is no rebound and no guarding.  Musculoskeletal: Normal range of motion.  Neurological: Alert, no facial droop, fluent speech, moves all extremities symmetrically Skin: Skin is warm and  dry.  Psychiatric: Cooperative   ED Course  Procedures (including critical care time) Labs Review Labs Reviewed  BASIC METABOLIC PANEL - Abnormal; Notable for the following:    Glucose, Bld 100 (*)    Calcium 8.8 (*)    All other components within normal limits  CBC WITH DIFFERENTIAL/PLATELET  TROPONIN I  TROPONIN I    Imaging Review Dg Chest 2 View  03/01/2015   CLINICAL DATA:  Bilateral chest pain for 5 days  EXAM: CHEST  2 VIEW  COMPARISON:  02/25/2015  FINDINGS: The heart size and mediastinal contours are within normal limits. Both lungs are clear. The visualized skeletal structures are unremarkable.  IMPRESSION: No active cardiopulmonary disease.   Electronically Signed   By: Natasha Mead M.D.   On: 03/01/2015 17:07     EKG Interpretation  None       EKG:  03/01/2015 15:44:27 Ventricular rate 102 PR Interval 178 ms QRS 96 ms QTc 422 Axis: normal Sinus tachycardia No significant change since last EKG   MDM   Final diagnoses:  Pericarditis  Chest pain on breathing     EMERGENCY DEPARTMENT [place] CARDIAC EXAM "Study: Limited Ultrasound of the heart and pericardium"  INDICATIONS: pericardial effusion in setting of possible pericarditis Multiple views of the heart and pericardium are obtained with a multi-frequency probe.  PERFORMED ZO:[id number]  IMAGES ARCHIVED?: No  FINDINGS: No pericardial effusion  LIMITATIONS: N/A  VIEWS USED: Parasternal long axis, Parasternal short axis and Apical 4 chamber   INTERPRETATION: Cardiac activity present, Pericardial effusioin absent and Normal contractility  COMMENT:  N/A    In short, this is a 59 year old male who presents with persistent chest pain after recent diagnosis of pericarditis. He is nontoxic in no acute distress on presentation. His vital signs are within normal limits. He on exam has an unremarkable cardiopulmonary exam, with no appreciable rub and no muffled heart sounds. Remainder of his exam is unremarkable.  EKG reveals sinus rhythm, without evidence of diffuse ST elevation or diffuse PR depressions. No evidence of ischemia is noted. On chart review, he received CT PE 4 days ago that was unremarkable and also had an unremarkable aorta. He also had negative serial troponins and no dynamic changes on his EKG. Chest x-ray is performed here, showing no acute cardiopulmonary processes. Bedside ultrasound reveals no evidence of pericardial fluid. Serial troponins are negative today and my suspicion for ACS again is very low especially given persistent chest pain for several days without elevated troponin. It is unlikely for him to develop thromboembolic disease or aortic pathology in the setting of this recent negative CT chest. Symptoms may be due to persistent pericarditis versus pleuritis. He has not had adequate treatment for pericarditis as he has not been compliant with his colchicine. This time I do not feel that he has any other serious or toxic etiology of his symptoms. The remainder of his basic blood work including CBC, basic metabolic panel are unremarkable. We discussed medication compliance at home. He is pain clinic patient has a pain contract. He will call his pain doctor this weekend to arrange additional pain treatments for home. He will also follow up with his primary care doctor on Monday. Strict return instructions were also reviewed. He expressed understanding of all discharge instructions follow comfortable to plan of care.    Lavera Guise, MD 03/01/15 2214  Lavera Guise, MD 03/01/15 2217

## 2015-03-01 NOTE — Discharge Instructions (Signed)
Please continue to take her colchicine as prescribed. Follow-up with your primary care doctor on Monday. Return without fail for worsening symptoms, including difficulty breathing, worsening pain, fevers, or any other symptoms concerning to you.  Pericarditis Pericarditis is swelling (inflammation) of the pericardium. The pericardium is a thin, double-layered, fluid-filled tissue sac that surrounds the heart. The purpose of the pericardium is to contain the heart in the chest cavity and keep the heart from overexpanding. Different types of pericarditis can occur, such as:  Acute pericarditis. Inflammation can develop suddenly in acute pericarditis.  Chronic pericarditis. Inflammation develops gradually and is long-lasting in chronic pericarditis.  Constrictive pericarditis. In this type of pericarditis, the layers of the pericardium stiffen and develop scar tissue. The scar tissue thickens and sticks together. This makes it difficult for the heart to pump and work as it normally does. CAUSES  Pericarditis can be caused from different conditions, such as:  A bacterial, fungal or viral infection.  After a heart attack (myocardial infarction).  After open-heart surgery (coronary bypass graft surgery).  Auto-immune conditions such as lupus, rheumatoid arthritis or scleroderma.  Kidney failure.  Low thyroid condition (hypothyroidism).  Cancer from another part of the body that has spread (metastasized) to the pericardium.  Chest injury or trauma.  After radiation treatment.  Certain medicines. SYMPTOMS  Symptoms of pericarditis can include:  Chest pain. Chest pain symptoms may increase when laying down and may be relieved when sitting up and leaning forward.  A chronic, dry cough.  Heart palpitations. These may feel like rapid, fluttering or pounding heart beats.  Chest pain may be worse when swallowing.  Dizziness or fainting.  Tiredness, fatigue or  lethargy.  Fever. DIAGNOSIS  Pericarditis is diagnosed by the following:  A physical exam. A heart sound called a pericardial friction rub may be heard when your caregiver listens to your heart.  Blood work. Blood may be drawn to check for an infection and to look at your blood chemistry.  Electrocardiography. During electrocardiography your heart's electrical activity is monitored and recorded with a tracing on paper (electrocardiogram [ECG]).  Echocardiography.  Computed tomography (CT).  Magnetic resonance image (MRI). TREATMENT  To treat pericarditis, it is important to know the cause of it. The cause of pericarditis determines the treatment.   If the cause of pericarditis is due to an infection, treatment is based on the type of infection. If an infection is suspected in the pericardial fluid, a procedure called a pericardial fluid culture and biopsy may be done. This takes a sample of the pericardial fluid. The sample is sent to a lab which runs tests on the pericardial fluid to check for an infection.  If the autoimmune disease is the cause, treatment of the autoimmune condition will help improve the pericarditis.  If the cause of pericarditis is not known, anti-inflammatory medicines may be used to help decrease the inflammation.  Surgery may be needed. The following are types of surgeries or procedures that may be done to treat pericarditis:  Pericardial window. A pericardial window makes a cut (incision) into the pericardial sac. This allows excess fluid in the pericardium to drain.  Pericardiocentesis. A pericardiocentesis is also known as a pericardial tap. This procedure uses a needle that is guided by X-ray to drain (aspirate) excess fluid from the pericardium.  Pericardiectomy. A pericardiectomy removes part or all of the pericardium. HOME CARE INSTRUCTIONS   Do not smoke. If you smoke, quit. Your caregiver can help you quit smoking.  Maintain  a healthy  weight.  Follow an exercise program as told by your caregiver.  If you drink alcohol, do so in moderation.  Eat a heart healthy diet. A registered dietician can help you learn about healthy food choices.  Keep a list of all your medicines with you at all times. Include the name, dose, how often it is taken and how it is taken. SEEK IMMEDIATE MEDICAL CARE IF:   You have chest pain or feelings of chest pressure.  You have sweating (diaphoresis) when at rest.  You have irregular heartbeats (palpitations).  You have rapid, racing heart beats.  You have unexplained fainting episodes.  You feel sick to your stomach (nausea) or vomiting without cause.  You have unexplained weakness. If you develop any of the symptoms which originally made you seek care, call for local emergency medical help. Do not drive yourself to the hospital. Document Released: 01/06/2001 Document Revised: 10/05/2011 Document Reviewed: 07/15/2011 [facility] Patient Information 2015 [place], [place]. This information is not intended to replace advice given to you by your health care provider. Make sure you discuss any questions you have with your health care provider.

## 2015-03-01 NOTE — ED Notes (Signed)
Bil CP x5 days.  Was seen at [facility] ED 4 days ago and diagnosed with pericarditis and dc'd to hom.  Sts the pain has been getting worse since then.  Pain is sharp, right-sided, and worse when swallowing.

## 2015-03-31 ENCOUNTER — Emergency Department (HOSPITAL_BASED_OUTPATIENT_CLINIC_OR_DEPARTMENT_OTHER)
Admission: EM | Admit: 2015-03-31 | Discharge: 2015-04-01 | Disposition: A | Discharge status: Home / Self Care | Payer: Medicaid Other | Attending: Emergency Medicine | Admitting: Emergency Medicine

## 2015-03-31 ENCOUNTER — Encounter (HOSPITAL_BASED_OUTPATIENT_CLINIC_OR_DEPARTMENT_OTHER): Payer: Self-pay | Admitting: *Deleted

## 2015-03-31 DIAGNOSIS — W1839XA Other fall on same level, initial encounter: Secondary | ICD-10-CM | POA: Insufficient documentation

## 2015-03-31 DIAGNOSIS — G8929 Other chronic pain: Secondary | ICD-10-CM | POA: Insufficient documentation

## 2015-03-31 DIAGNOSIS — E785 Hyperlipidemia, unspecified: Secondary | ICD-10-CM | POA: Diagnosis not present

## 2015-03-31 DIAGNOSIS — Z87891 Personal history of nicotine dependence: Secondary | ICD-10-CM | POA: Diagnosis not present

## 2015-03-31 DIAGNOSIS — S8992XA Unspecified injury of left lower leg, initial encounter: Secondary | ICD-10-CM | POA: Diagnosis not present

## 2015-03-31 DIAGNOSIS — Y9289 Other specified places as the place of occurrence of the external cause: Secondary | ICD-10-CM | POA: Insufficient documentation

## 2015-03-31 DIAGNOSIS — Y9389 Activity, other specified: Secondary | ICD-10-CM | POA: Diagnosis not present

## 2015-03-31 DIAGNOSIS — I10 Essential (primary) hypertension: Secondary | ICD-10-CM | POA: Insufficient documentation

## 2015-03-31 DIAGNOSIS — Y998 Other external cause status: Secondary | ICD-10-CM | POA: Diagnosis not present

## 2015-03-31 DIAGNOSIS — Z96652 Presence of left artificial knee joint: Secondary | ICD-10-CM | POA: Diagnosis not present

## 2015-03-31 DIAGNOSIS — Z79899 Other long term (current) drug therapy: Secondary | ICD-10-CM | POA: Insufficient documentation

## 2015-03-31 DIAGNOSIS — Z8719 Personal history of other diseases of the digestive system: Secondary | ICD-10-CM | POA: Insufficient documentation

## 2015-03-31 NOTE — ED Notes (Signed)
Pt reports fell around lunch today. Pain to left knee. Previous replacement to same knee. Seen at pain clinic, but out of pain meds.

## 2015-04-01 ENCOUNTER — Emergency Department (HOSPITAL_BASED_OUTPATIENT_CLINIC_OR_DEPARTMENT_OTHER): Payer: Medicaid Other

## 2015-04-01 MED ORDER — KETOROLAC TROMETHAMINE 60 MG/2ML IM SOLN
60.0000 mg | Freq: Once | INTRAMUSCULAR | Status: AC
  Administered 2015-04-01: 60 mg via INTRAMUSCULAR
  Filled 2015-04-01: qty 2

## 2015-04-01 NOTE — ED Provider Notes (Signed)
CSN: [id number]     Arrival date & time 03/31/15  2343 History   First MD Initiated Contact with Patient 04/01/15 0010     Chief Complaint  Patient presents with  . Fall    left knee     (Consider location/radiation/quality/duration/timing/severity/associated sxs/prior Treatment) HPI  This is a 59 year old male who had a left total knee arthroplasty about 10 years ago which was revised in March of this year. He states he fell yesterday afternoon injuring it. There is severe pain in his left knee, worse with movement or attempted weightbearing. There is no associated deformity. There is no associated numbness or weakness distally.  It is noted that the patient is on chronic pain medication. On August 17 of this year he received 120 30 milligram oxycodone tablets, 60 morphine sulfate ER 60 milligrams tablets and 60 morphine sulfate ER 30 milligrams tablets. Records indicate this was a 30 day supply. He admits to having used up his pain medications early, not in accordance with prescription instructions.  Past Medical History  Diagnosis Date  . Hypertension   . Chronic knee pain   . Hyperlipemia   . PUD (peptic ulcer disease)    Past Surgical History  Procedure Laterality Date  . I&d extremity Left 07/12/2013    Procedure: I&D Left Thigh Abscess;  Surgeon: [name], MD;  Location: [facility] OR;  Service: Orthopedics;  Laterality: Left;  . Replacement total knee bilateral    . Tonsillectomy    . Hernia repair    . Cholecystectomy    . Appendectomy    . Shoulder surgery    . Elbow surgery     Family History  Problem Relation Age of Onset  . Heart attack Paternal Grandmother 107  . Heart disease Mother 79    arrythmia  . Stroke Paternal Grandfather   . Stroke Maternal Grandfather    Social History  Substance Use Topics  . Smoking status: Former Smoker    Types: Cigarettes  . Smokeless tobacco: None  . Alcohol Use: No    Review of Systems  All other systems reviewed and are  negative.   Allergies  Imitrex and Verapamil  Home Medications   Prior to Admission medications   Medication Sig Start Date End Date Taking? Authorizing Provider  albuterol (PROAIR HFA) 108 (90 BASE) MCG/ACT inhaler Inhale 1 puff into the lungs every 6 (six) hours as needed for wheezing or shortness of breath.  09/06/13   Historical Provider, MD  atenolol (TENORMIN) 100 MG tablet Take 100 mg by mouth daily.    Historical Provider, MD  atorvastatin (LIPITOR) 80 MG tablet Take 80 mg by mouth daily.    Historical Provider, MD  cephALEXin (KEFLEX) 500 MG capsule Take 1 capsule (500 mg total) by mouth 4 (four) times daily. Patient not taking: Reported on 02/25/2015 10/08/14   Rolland Porter, MD  clonazePAM (KLONOPIN) 2 MG tablet Take 2 mg by mouth 4 (four) times daily.    Historical Provider, MD  colchicine 0.6 MG tablet Take 1 tablet (0.6 mg total) by mouth 2 (two) times daily. 02/25/15   Ames Dura, MD  Diclofenac-Misoprostol 75-0.2 MG TBEC Take 1 tablet by mouth 2 (two) times daily. 02/21/15   Historical Provider, MD  diphenhydrAMINE (BENADRYL) 25 MG tablet Take 1 tablet (25 mg total) by mouth every 6 (six) hours. Patient not taking: Reported on 02/25/2015 10/05/14   Gwyneth Sprout, MD  famciclovir (FAMVIR) 500 MG tablet Take 500 mg by mouth 2 (two) times  daily as needed (outbreak).  10/25/13   Historical Provider, MD  ibuprofen (ADVIL,MOTRIN) 800 MG tablet Take 1 tablet (800 mg total) by mouth 3 (three) times daily. 02/25/15   Ames Dura, MD  morphine (MS CONTIN) 30 MG 12 hr tablet Take 30 mg by mouth 2 (two) times daily. 02/11/15   Historical Provider, MD  morphine (MS CONTIN) 60 MG 12 hr tablet Take 60 mg by mouth 2 (two) times daily. 02/11/15   Historical Provider, MD  naloxegol oxalate (MOVANTIK) 25 MG TABS tablet Take 25 mg by mouth 2 (two) times daily. 09/18/14   Historical Provider, MD  ondansetron (ZOFRAN) 8 MG tablet Take 8 mg by mouth daily as needed for nausea or vomiting.  12/25/14    Historical Provider, MD  oxycodone (ROXICODONE) 30 MG immediate release tablet Take 30 mg by mouth 4 (four) times daily.     Historical Provider, MD  oxyCODONE-acetaminophen (PERCOCET/ROXICET) 5-325 MG per tablet Take 1 tablet by mouth every 6 (six) hours as needed for severe pain. 02/25/15   Ames Dura, MD  pantoprazole (PROTONIX) 40 MG tablet Take 1 tablet (40 mg total) by mouth daily. Patient taking differently: Take 40 mg by mouth daily as needed (heartburn).  11/17/13   Kathlen Mody, MD  predniSONE (DELTASONE) 20 MG tablet Take 2 tablets (40 mg total) by mouth daily with breakfast. Patient not taking: Reported on 02/25/2015 10/08/14   Rolland Porter, MD  SUMAtriptan (IMITREX) 50 MG tablet Take 50 mg by mouth daily as needed for migraine or headache.  02/15/15   Historical Provider, MD   BP 135/88 mmHg  Pulse 117  Temp(Src) 98.9 F (37.2 C) (Oral)  Resp 24  Ht  (1.93 m)  Wt 235 lb (106.595 kg)  BMI 28.62 kg/m2  SpO2 100%   Physical Exam  General: Well-developed, well-nourished male in no acute distress; appearance consistent with age of record HENT: normocephalic; atraumatic Eyes: Normal appearance Neck: supple Heart: regular rate and rhythm Lungs: Normal respiratory effort and excursion Abdomen: soft; nondistended Extremities: No deformity; pulses normal; well-healed surgical incision to left knee, tenderness on ballottement of left patella, pain on attempted range of motion of left knee, left lower extremity distally neurovascularly intact Neurologic: Awake, alert and oriented; motor function intact in all extremities and symmetric; no facial droop Skin: Warm and dry Psychiatric: Normal mood and affect    ED Course  Procedures (including critical care time)    MDM  Nursing notes and vitals signs, including pulse oximetry, reviewed.  Summary of this visit's results, reviewed by myself:  Imaging Studies: Dg Knee Complete 4 Views Left  04/01/2015   CLINICAL DATA:   Patient fell at noon on 03/31/2015.  Left knee pain.  EXAM: LEFT KNEE - COMPLETE 4+ VIEW  COMPARISON:  12/30/2014  FINDINGS: Previous left total knee arthroplasty. Patellar femoral component is present. Components appear well seated. Mild diffuse soft tissue swelling similar to previous study. No significant effusion. No evidence of acute fracture or dislocation.  IMPRESSION: Postoperative left total knee arthroplasty. Components appear well seated. Diffuse soft tissue swelling similar to prior study. No acute bony abnormalities.   Electronically Signed   By: Burman Nieves M.D.   On: 04/01/2015 01:58   The patient was advised that we do not refill stolen, lost or improperly taken prescriptions for narcotics. He was advised to contact his pain management physician as soon as possible.     Paula Libra, MD 04/01/15 [phone]

## 2015-04-06 ENCOUNTER — Inpatient Hospital Stay (HOSPITAL_BASED_OUTPATIENT_CLINIC_OR_DEPARTMENT_OTHER)
Admission: EM | Admit: 2015-04-06 | Discharge: 2015-04-30 | DRG: 853 | Disposition: A | Discharge status: Rehabilitation Facility | Payer: Medicaid Other | Attending: Internal Medicine | Admitting: Internal Medicine

## 2015-04-06 ENCOUNTER — Encounter (HOSPITAL_BASED_OUTPATIENT_CLINIC_OR_DEPARTMENT_OTHER): Payer: Self-pay | Admitting: Emergency Medicine

## 2015-04-06 DIAGNOSIS — F1110 Opioid abuse, uncomplicated: Secondary | ICD-10-CM

## 2015-04-06 DIAGNOSIS — S52592A Other fractures of lower end of left radius, initial encounter for closed fracture: Secondary | ICD-10-CM | POA: Diagnosis present

## 2015-04-06 DIAGNOSIS — I5189 Other ill-defined heart diseases: Secondary | ICD-10-CM | POA: Diagnosis present

## 2015-04-06 DIAGNOSIS — I724 Aneurysm of artery of lower extremity: Secondary | ICD-10-CM

## 2015-04-06 DIAGNOSIS — E43 Unspecified severe protein-calorie malnutrition: Secondary | ICD-10-CM | POA: Diagnosis present

## 2015-04-06 DIAGNOSIS — T39395A Adverse effect of other nonsteroidal anti-inflammatory drugs [NSAID], initial encounter: Secondary | ICD-10-CM | POA: Diagnosis present

## 2015-04-06 DIAGNOSIS — I97648 Postprocedural seroma of a circulatory system organ or structure following other circulatory system procedure: Secondary | ICD-10-CM | POA: Diagnosis not present

## 2015-04-06 DIAGNOSIS — M86132 Other acute osteomyelitis, left radius and ulna: Secondary | ICD-10-CM | POA: Diagnosis not present

## 2015-04-06 DIAGNOSIS — M96831 Postprocedural hemorrhage and hematoma of a musculoskeletal structure following other procedure: Secondary | ICD-10-CM | POA: Diagnosis not present

## 2015-04-06 DIAGNOSIS — L0291 Cutaneous abscess, unspecified: Secondary | ICD-10-CM

## 2015-04-06 DIAGNOSIS — S62102G Fracture of unspecified carpal bone, left wrist, subsequent encounter for fracture with delayed healing: Secondary | ICD-10-CM | POA: Diagnosis not present

## 2015-04-06 DIAGNOSIS — S52532A Colles' fracture of left radius, initial encounter for closed fracture: Secondary | ICD-10-CM | POA: Diagnosis present

## 2015-04-06 DIAGNOSIS — N179 Acute kidney failure, unspecified: Secondary | ICD-10-CM | POA: Diagnosis present

## 2015-04-06 DIAGNOSIS — I82441 Acute embolism and thrombosis of right tibial vein: Secondary | ICD-10-CM | POA: Diagnosis not present

## 2015-04-06 DIAGNOSIS — G894 Chronic pain syndrome: Secondary | ICD-10-CM | POA: Diagnosis present

## 2015-04-06 DIAGNOSIS — M71032 Abscess of bursa, left wrist: Secondary | ICD-10-CM | POA: Diagnosis not present

## 2015-04-06 DIAGNOSIS — I471 Supraventricular tachycardia: Secondary | ICD-10-CM | POA: Diagnosis not present

## 2015-04-06 DIAGNOSIS — A419 Sepsis, unspecified organism: Secondary | ICD-10-CM | POA: Diagnosis present

## 2015-04-06 DIAGNOSIS — A4101 Sepsis due to Methicillin susceptible Staphylococcus aureus: Secondary | ICD-10-CM | POA: Diagnosis not present

## 2015-04-06 DIAGNOSIS — N10 Acute tubulo-interstitial nephritis: Secondary | ICD-10-CM | POA: Diagnosis present

## 2015-04-06 DIAGNOSIS — Z8711 Personal history of peptic ulcer disease: Secondary | ICD-10-CM

## 2015-04-06 DIAGNOSIS — S52502A Unspecified fracture of the lower end of left radius, initial encounter for closed fracture: Secondary | ICD-10-CM | POA: Diagnosis present

## 2015-04-06 DIAGNOSIS — Z89411 Acquired absence of right great toe: Secondary | ICD-10-CM | POA: Diagnosis not present

## 2015-04-06 DIAGNOSIS — I824Y1 Acute embolism and thrombosis of unspecified deep veins of right proximal lower extremity: Secondary | ICD-10-CM | POA: Diagnosis not present

## 2015-04-06 DIAGNOSIS — I10 Essential (primary) hypertension: Secondary | ICD-10-CM | POA: Diagnosis present

## 2015-04-06 DIAGNOSIS — R609 Edema, unspecified: Secondary | ICD-10-CM

## 2015-04-06 DIAGNOSIS — R791 Abnormal coagulation profile: Secondary | ICD-10-CM | POA: Diagnosis present

## 2015-04-06 DIAGNOSIS — I272 Pulmonary hypertension, unspecified: Secondary | ICD-10-CM | POA: Diagnosis present

## 2015-04-06 DIAGNOSIS — L0390 Cellulitis, unspecified: Secondary | ICD-10-CM

## 2015-04-06 DIAGNOSIS — R6521 Severe sepsis with septic shock: Secondary | ICD-10-CM | POA: Diagnosis present

## 2015-04-06 DIAGNOSIS — Z452 Encounter for adjustment and management of vascular access device: Secondary | ICD-10-CM

## 2015-04-06 DIAGNOSIS — B958 Unspecified staphylococcus as the cause of diseases classified elsewhere: Secondary | ICD-10-CM | POA: Diagnosis present

## 2015-04-06 DIAGNOSIS — B9561 Methicillin susceptible Staphylococcus aureus infection as the cause of diseases classified elsewhere: Secondary | ICD-10-CM | POA: Diagnosis present

## 2015-04-06 DIAGNOSIS — Z6828 Body mass index (BMI) 28.0-28.9, adult: Secondary | ICD-10-CM

## 2015-04-06 DIAGNOSIS — M79603 Pain in arm, unspecified: Secondary | ICD-10-CM | POA: Diagnosis present

## 2015-04-06 DIAGNOSIS — M79602 Pain in left arm: Secondary | ICD-10-CM

## 2015-04-06 DIAGNOSIS — L02415 Cutaneous abscess of right lower limb: Secondary | ICD-10-CM | POA: Diagnosis present

## 2015-04-06 DIAGNOSIS — L02419 Cutaneous abscess of limb, unspecified: Secondary | ICD-10-CM | POA: Diagnosis not present

## 2015-04-06 DIAGNOSIS — G629 Polyneuropathy, unspecified: Secondary | ICD-10-CM | POA: Diagnosis present

## 2015-04-06 DIAGNOSIS — R296 Repeated falls: Secondary | ICD-10-CM

## 2015-04-06 DIAGNOSIS — Z8719 Personal history of other diseases of the digestive system: Secondary | ICD-10-CM

## 2015-04-06 DIAGNOSIS — E876 Hypokalemia: Secondary | ICD-10-CM

## 2015-04-06 DIAGNOSIS — L03012 Cellulitis of left finger: Secondary | ICD-10-CM | POA: Diagnosis present

## 2015-04-06 DIAGNOSIS — Z8614 Personal history of Methicillin resistant Staphylococcus aureus infection: Secondary | ICD-10-CM

## 2015-04-06 DIAGNOSIS — I739 Peripheral vascular disease, unspecified: Secondary | ICD-10-CM | POA: Diagnosis present

## 2015-04-06 DIAGNOSIS — T829XXA Unspecified complication of cardiac and vascular prosthetic device, implant and graft, initial encounter: Secondary | ICD-10-CM

## 2015-04-06 DIAGNOSIS — Z87891 Personal history of nicotine dependence: Secondary | ICD-10-CM

## 2015-04-06 DIAGNOSIS — D62 Acute posthemorrhagic anemia: Secondary | ICD-10-CM | POA: Diagnosis present

## 2015-04-06 DIAGNOSIS — W19XXXA Unspecified fall, initial encounter: Secondary | ICD-10-CM | POA: Diagnosis present

## 2015-04-06 DIAGNOSIS — S5290XA Unspecified fracture of unspecified forearm, initial encounter for closed fracture: Secondary | ICD-10-CM

## 2015-04-06 DIAGNOSIS — R7881 Bacteremia: Secondary | ICD-10-CM | POA: Diagnosis not present

## 2015-04-06 DIAGNOSIS — R17 Unspecified jaundice: Secondary | ICD-10-CM | POA: Diagnosis present

## 2015-04-06 DIAGNOSIS — L02512 Cutaneous abscess of left hand: Secondary | ICD-10-CM | POA: Diagnosis present

## 2015-04-06 DIAGNOSIS — B9562 Methicillin resistant Staphylococcus aureus infection as the cause of diseases classified elsewhere: Secondary | ICD-10-CM | POA: Diagnosis present

## 2015-04-06 DIAGNOSIS — E860 Dehydration: Secondary | ICD-10-CM

## 2015-04-06 DIAGNOSIS — N390 Urinary tract infection, site not specified: Secondary | ICD-10-CM

## 2015-04-06 DIAGNOSIS — R34 Anuria and oliguria: Secondary | ICD-10-CM | POA: Diagnosis present

## 2015-04-06 DIAGNOSIS — R6510 Systemic inflammatory response syndrome (SIRS) of non-infectious origin without acute organ dysfunction: Secondary | ICD-10-CM

## 2015-04-06 DIAGNOSIS — I96 Gangrene, not elsewhere classified: Secondary | ICD-10-CM | POA: Diagnosis not present

## 2015-04-06 DIAGNOSIS — S5292XA Unspecified fracture of left forearm, initial encounter for closed fracture: Secondary | ICD-10-CM | POA: Diagnosis present

## 2015-04-06 DIAGNOSIS — M7989 Other specified soft tissue disorders: Secondary | ICD-10-CM | POA: Diagnosis present

## 2015-04-06 DIAGNOSIS — I82411 Acute embolism and thrombosis of right femoral vein: Secondary | ICD-10-CM | POA: Diagnosis not present

## 2015-04-06 DIAGNOSIS — I2690 Septic pulmonary embolism without acute cor pulmonale: Secondary | ICD-10-CM | POA: Diagnosis not present

## 2015-04-06 DIAGNOSIS — S52509A Unspecified fracture of the lower end of unspecified radius, initial encounter for closed fracture: Secondary | ICD-10-CM | POA: Diagnosis present

## 2015-04-06 DIAGNOSIS — I82403 Acute embolism and thrombosis of unspecified deep veins of lower extremity, bilateral: Secondary | ICD-10-CM

## 2015-04-06 DIAGNOSIS — E639 Nutritional deficiency, unspecified: Secondary | ICD-10-CM

## 2015-04-06 DIAGNOSIS — N170 Acute kidney failure with tubular necrosis: Secondary | ICD-10-CM

## 2015-04-06 DIAGNOSIS — Z96653 Presence of artificial knee joint, bilateral: Secondary | ICD-10-CM | POA: Diagnosis present

## 2015-04-06 DIAGNOSIS — M869 Osteomyelitis, unspecified: Secondary | ICD-10-CM | POA: Diagnosis not present

## 2015-04-06 DIAGNOSIS — L03114 Cellulitis of left upper limb: Secondary | ICD-10-CM | POA: Diagnosis present

## 2015-04-06 DIAGNOSIS — I82409 Acute embolism and thrombosis of unspecified deep veins of unspecified lower extremity: Secondary | ICD-10-CM | POA: Diagnosis present

## 2015-04-06 DIAGNOSIS — M86671 Other chronic osteomyelitis, right ankle and foot: Secondary | ICD-10-CM | POA: Diagnosis not present

## 2015-04-06 DIAGNOSIS — E785 Hyperlipidemia, unspecified: Secondary | ICD-10-CM | POA: Diagnosis present

## 2015-04-06 DIAGNOSIS — R6520 Severe sepsis without septic shock: Secondary | ICD-10-CM

## 2015-04-06 NOTE — ED Notes (Signed)
Patient reports that he fell about 2 -3 days ago onto his left hand and arm. The patient reports that he can not get the swelling to go down

## 2015-04-06 NOTE — ED Provider Notes (Signed)
CSN: [id number]     Arrival date & time 04/06/15  2332 History  This chart was scribed for Craig Biscardi, MD by Lyndel Safe, ED Scribe. This patient was seen in room MH12/MH12 and the patient's care was started 11:58 PM.   Chief Complaint  Patient presents with  . Tachycardia   Patient is a 59 y.o. male presenting with hand pain. The history is provided by the patient. No language interpreter was used.  Hand Pain This is a new problem. The current episode started more than 2 days ago. The problem occurs constantly. The problem has been gradually worsening. Pertinent negatives include no chest pain, no abdominal pain, no headaches and no shortness of breath. Nothing aggravates the symptoms. Nothing relieves the symptoms. Treatments tried: ice, elevation, narcotic pain medication. The treatment provided no relief.   HPI Comments: [name] is a 59 y.o. male who presents to the Emergency Department complaining of gradually worsening, constant, 8/10 pain to left hand s/p fall that occurred 3.5 days ago. He reports his left fingers are numb and that he cannot move them. Pt also endorses pain in left elbow and left shoulder. Pt additionally notes he has not been eating or drinking per his normal. Not taking blood thinning medication. He is on several narcotic pain medications and has a pain management contract. He has applied ice and elevated his left extremity with no relief. Reports diarrhea. Per nurse, pt was diaphoretic upon entering room and leads would not adhere to chest. Denies cough, congestion, vomiting, or dysuria.    Past Medical History  Diagnosis Date  . Hypertension   . Chronic knee pain   . Hyperlipemia   . PUD (peptic ulcer disease)    Past Surgical History  Procedure Laterality Date  . I&d extremity Left 07/12/2013    Procedure: I&D Left Thigh Abscess;  Surgeon: [name], MD;  Location: [facility] OR;  Service: Orthopedics;  Laterality: Left;  . Replacement total knee bilateral     . Tonsillectomy    . Hernia repair    . Cholecystectomy    . Appendectomy    . Shoulder surgery    . Elbow surgery     Family History  Problem Relation Age of Onset  . Heart attack Paternal Grandmother 64  . Heart disease Mother 68    arrythmia  . Stroke Paternal Grandfather   . Stroke Maternal Grandfather    Social History  Substance Use Topics  . Smoking status: Former Smoker    Types: Cigarettes  . Smokeless tobacco: None  . Alcohol Use: No    Review of Systems  Constitutional: Positive for appetite change. Negative for fever.  HENT: Negative for congestion.   Respiratory: Negative for cough and shortness of breath.   Cardiovascular: Negative for chest pain.  Gastrointestinal: Positive for diarrhea. Negative for nausea, vomiting and abdominal pain.  Genitourinary: Negative for dysuria.  Musculoskeletal: Positive for arthralgias ( left elbow, hand, shoulder).  Neurological: Negative for speech difficulty and headaches.  All other systems reviewed and are negative.  Allergies  Imitrex and Verapamil  Home Medications   Prior to Admission medications   Medication Sig Start Date End Date Taking? Authorizing Provider  albuterol (PROAIR HFA) 108 (90 BASE) MCG/ACT inhaler Inhale 1 puff into the lungs every 6 (six) hours as needed for wheezing or shortness of breath.  09/06/13   Historical Provider, MD  atenolol (TENORMIN) 100 MG tablet Take 100 mg by mouth daily.    Historical Provider,  MD  atorvastatin (LIPITOR) 80 MG tablet Take 80 mg by mouth daily.    Historical Provider, MD  clonazePAM (KLONOPIN) 2 MG tablet Take 2 mg by mouth 4 (four) times daily.    Historical Provider, MD  colchicine 0.6 MG tablet Take 1 tablet (0.6 mg total) by mouth 2 (two) times daily. 02/25/15   Ames Dura, MD  Diclofenac-Misoprostol 75-0.2 MG TBEC Take 1 tablet by mouth 2 (two) times daily. 02/21/15   Historical Provider, MD  famciclovir (FAMVIR) 500 MG tablet Take 500 mg by mouth 2 (two)  times daily as needed (outbreak).  10/25/13   Historical Provider, MD  ibuprofen (ADVIL,MOTRIN) 800 MG tablet Take 1 tablet (800 mg total) by mouth 3 (three) times daily. 02/25/15   Ames Dura, MD  morphine (MS CONTIN) 30 MG 12 hr tablet Take 30 mg by mouth 2 (two) times daily. 02/11/15   Historical Provider, MD  morphine (MS CONTIN) 60 MG 12 hr tablet Take 60 mg by mouth 2 (two) times daily. 02/11/15   Historical Provider, MD  naloxegol oxalate (MOVANTIK) 25 MG TABS tablet Take 25 mg by mouth 2 (two) times daily. 09/18/14   Historical Provider, MD  ondansetron (ZOFRAN) 8 MG tablet Take 8 mg by mouth daily as needed for nausea or vomiting.  12/25/14   Historical Provider, MD  oxycodone (ROXICODONE) 30 MG immediate release tablet Take 30 mg by mouth 4 (four) times daily.     Historical Provider, MD  oxyCODONE-acetaminophen (PERCOCET/ROXICET) 5-325 MG per tablet Take 1 tablet by mouth every 6 (six) hours as needed for severe pain. 02/25/15   Ames Dura, MD  SUMAtriptan (IMITREX) 50 MG tablet Take 50 mg by mouth daily as needed for migraine or headache.  02/15/15   Historical Provider, MD   BP 81/47 mmHg  Pulse 152  Resp 28  Ht 6\' 3"  (1.905 m)  Wt 228 lb (103.42 kg)  BMI 28.50 kg/m2  SpO2 100% Physical Exam  Constitutional: He appears well-developed and well-nourished. No distress.  HENT:  Head: Normocephalic.  Right Ear: External ear normal.  Left Ear: External ear normal.  Mouth/Throat: No oropharyngeal exudate.  Eyes: Conjunctivae and EOM are normal. Pupils are equal, round, and reactive to light. Right eye exhibits no discharge. Left eye exhibits no discharge. No scleral icterus.  Neck: Normal range of motion. Neck supple. No JVD present.  Trachea midline; no bruits; no JVD.   Cardiovascular: Regular rhythm, normal heart sounds and intact distal pulses.  Tachycardia present.   Pulses:      Carotid pulses are 2+ on the right side, and 2+ on the left side.      Radial pulses are 2+ on the  right side, and 2+ on the left side.       Femoral pulses are 2+ on the right side, and 2+ on the left side.      Popliteal pulses are 2+ on the right side, and 2+ on the left side.       Dorsalis pedis pulses are 2+ on the right side, and 2+ on the left side.       Posterior tibial pulses are 2+ on the right side, and 2+ on the left side.  Tachycardic; normal S1-S2   Pulmonary/Chest: Effort normal and breath sounds normal. No respiratory distress. He has no wheezes. He has no rales.  Abdominal: Soft. There is no tenderness. There is no rebound and no guarding.  Hypoactive bowel sounds  Musculoskeletal: Normal range of motion.  He exhibits edema. He exhibits no tenderness.       Left elbow: He exhibits no effusion and no deformity.       Left wrist: He exhibits normal range of motion, no tenderness, no bony tenderness, no swelling, no effusion, no crepitus, no deformity and no laceration.       Left hand: He exhibits swelling. He exhibits normal range of motion, no tenderness, no bony tenderness, normal two-point discrimination, normal capillary refill, no deformity and no laceration. Normal sensation noted. Normal strength noted.  Abrasion over left olecranon with surrounding mild redness; compartments of hand are soft .  NVI left hand, intact pronation and supination. All compartments of forearm are soft; strong radial pulse color and warm are symmetric with the RUE cap refill less than 2 seconds; no snuff box tenderness.  Neurological: He is alert. He has normal reflexes. Coordination normal.  Skin: Skin is warm. No rash noted. No erythema. No pallor.  Psychiatric: He has a normal mood and affect. His behavior is normal.  Nursing note and vitals reviewed.   ED Course  Procedures  DIAGNOSTIC STUDIES: Oxygen Saturation is 100% on RA, normal by my interpretation.    COORDINATION OF CARE: 12:07 AM Discussed treatment plan with pt. Pt acknowledges and agrees to plan.   Labs Review Labs  Reviewed  I-STAT CG4 LACTIC ACID, ED - Abnormal; Notable for the following:    Lactic Acid, Venous 4.16 (*)    All other components within normal limits  CBC WITH DIFFERENTIAL/PLATELET  COMPREHENSIVE METABOLIC PANEL  URINALYSIS, ROUTINE W REFLEX MICROSCOPIC (NOT AT [facility])  TROPONIN I  URINE RAPID DRUG SCREEN, HOSP PERFORMED  ACETAMINOPHEN LEVEL  SALICYLATE LEVEL    Imaging Review No results found. I have personally reviewed and evaluated these images and lab results as part of my medical decision-making.   EKG Interpretation   Date/Time:  Saturday April 06 2015 23:50:42 EDT Ventricular Rate:  149 PR Interval:  118 QRS Duration: 88 QT Interval:  320 QTC Calculation: 504 R Axis:   -12 Text Interpretation:  Sinus tachycardia Confirmed by [facility]  MD,  Deijah Spikes ([id number]) on 04/07/2015 12:10:28 AM      MDM   Final diagnoses:  None    Medications  0.9 %  sodium chloride infusion (0 mLs Intravenous Stopped 04/07/15 0016)    Followed by  0.9 %  sodium chloride infusion (0 mLs Intravenous Stopped 04/07/15 0141)    Followed by  0.9 %  sodium chloride infusion (0 mLs Intravenous Stopped 04/07/15 0141)    Followed by  0.9 %  sodium chloride infusion (1,000 mLs Intravenous New Bag/Given 04/07/15 0141)  Tdap (BOOSTRIX) injection 0.5 mL (0.5 mLs Intramuscular Not Given 04/07/15 0010)  potassium chloride 10 mEq in 100 mL IVPB (10 mEq Intravenous New Bag/Given 04/07/15 0303)  sodium chloride 0.9 % bolus 1,000 mL (not administered)  magnesium sulfate IVPB 2 g 50 mL (0 g Intravenous Stopped 04/07/15 0300)  vancomycin (VANCOCIN) IVPB 1000 mg/200 mL premix (0 mg Intravenous Stopped 04/07/15 0311)  piperacillin-tazobactam (ZOSYN) IVPB 3.375 g (3.375 g Intravenous New Bag/Given 04/07/15 0310)  ketorolac (TORADOL) 30 MG/ML injection 30 mg (30 mg Intravenous Given 04/07/15 0302)   Results for orders placed or performed during the hospital encounter of 04/06/15  CBC with Differential/Platelet   Result Value Ref Range   WBC 31.3 (H) 4.0 - 10.5 K/uL   RBC 4.41 4.22 - 5.81 MIL/uL   Hemoglobin 11.6 (L) 13.0 - 17.0 g/dL   HCT  34.0 (L) 39.0 - 52.0 %   MCV 77.1 (L) 78.0 - 100.0 fL   MCH 26.3 26.0 - 34.0 pg   MCHC 34.1 30.0 - 36.0 g/dL   RDW 16.1 (H) 09.6 - 04.5 %   Platelets 255 150 - 400 K/uL   Neutrophils Relative % 79 (H) 43 - 77 %   Lymphocytes Relative 4 (L) 12 - 46 %   Monocytes Relative 4 3 - 12 %   Eosinophils Relative 0 0 - 5 %   Basophils Relative 0 0 - 1 %   Band Neutrophils 11 (H) 0 - 10 %   Metamyelocytes Relative 1 %   Myelocytes 1 %   Promyelocytes Absolute 0 %   Blasts 0 %   nRBC 0 0 /100 WBC   Other 0 %   Neutro Abs 28.7 (H) 1.7 - 7.7 K/uL   Lymphs Abs 1.3 0.7 - 4.0 K/uL   Monocytes Absolute 1.3 (H) 0.1 - 1.0 K/uL   Eosinophils Absolute 0.0 0.0 - 0.7 K/uL   Basophils Absolute 0.0 0.0 - 0.1 K/uL   RBC Morphology BURR CELLS    WBC Morphology TOXIC GRANULATION   Comprehensive metabolic panel  Result Value Ref Range   Sodium 132 (L) 135 - 145 mmol/L   Potassium 2.9 (L) 3.5 - 5.1 mmol/L   Chloride 98 (L) 101 - 111 mmol/L   CO2 17 (L) 22 - 32 mmol/L   Glucose, Bld 141 (H) 65 - 99 mg/dL   BUN 56 (H) 6 - 20 mg/dL   Creatinine, Ser 4.09 (H) 0.61 - 1.24 mg/dL   Calcium 8.3 (L) 8.9 - 10.3 mg/dL   Total Protein 6.8 6.5 - 8.1 g/dL   Albumin 2.4 (L) 3.5 - 5.0 g/dL   AST 34 15 - 41 U/L   ALT 21 17 - 63 U/L   Alkaline Phosphatase 129 (H) 38 - 126 U/L   Total Bilirubin 2.3 (H) 0.3 - 1.2 mg/dL   GFR calc non Af Amer 13 (L) >60 mL/min   GFR calc Af Amer 16 (L) >60 mL/min   Anion gap 17 (H) 5 - 15  Urinalysis, Routine w reflex microscopic (not at [facility])  Result Value Ref Range   Color, Urine ORANGE (A) YELLOW   APPearance TURBID (A) CLEAR   Specific Gravity, Urine 1.019 1.005 - 1.030   pH 5.0 5.0 - 8.0   Glucose, UA NEGATIVE NEGATIVE mg/dL   Hgb urine dipstick MODERATE (A) NEGATIVE   Bilirubin Urine MODERATE (A) NEGATIVE   Ketones, ur 15 (A) NEGATIVE mg/dL    Protein, ur 811 (A) NEGATIVE mg/dL   Urobilinogen, UA 1.0 0.0 - 1.0 mg/dL   Nitrite POSITIVE (A) NEGATIVE   Leukocytes, UA SMALL (A) NEGATIVE  Troponin I  Result Value Ref Range   Troponin I <0.03 <0.031 ng/mL  Urine rapid drug screen (hosp performed)  Result Value Ref Range   Opiates POSITIVE (A) NONE DETECTED   Cocaine NONE DETECTED NONE DETECTED   Benzodiazepines POSITIVE (A) NONE DETECTED   Amphetamines NONE DETECTED NONE DETECTED   Tetrahydrocannabinol NONE DETECTED NONE DETECTED   Barbiturates NONE DETECTED NONE DETECTED  Acetaminophen level  Result Value Ref Range   Acetaminophen (Tylenol), Serum <10 (L) 10 - 30 ug/mL  Salicylate level  Result Value Ref Range   Salicylate Lvl <4.0 2.8 - 30.0 mg/dL  Potassium  Result Value Ref Range   Potassium 2.7 (LL) 3.5 - 5.1 mmol/L  Magnesium  Result Value Ref Range  Magnesium 1.3 (L) 1.7 - 2.4 mg/dL  Urine microscopic-add on  Result Value Ref Range   Squamous Epithelial / LPF RARE RARE   WBC, UA 3-6 <3 WBC/hpf   RBC / HPF 0-2 <3 RBC/hpf   Bacteria, UA MANY (A) RARE   Casts GRANULAR CAST (A) NEGATIVE   Crystals CA OXALATE CRYSTALS (A) NEGATIVE  CK  Result Value Ref Range   Total CK 495 (H) 49 - 397 U/L  I-Stat CG4 Lactic Acid, ED  Result Value Ref Range   Lactic Acid, Venous 4.16 (HH) 0.5 - 2.0 mmol/L   Comment NOTIFIED PHYSICIAN   I-Stat CG4 Lactic Acid, ED  Result Value Ref Range   Lactic Acid, Venous 2.45 (HH) 0.5 - 2.0 mmol/L   Comment NOTIFIED PHYSICIAN    Dg Chest 2 View  04/07/2015   CLINICAL DATA:  59 year old male with recent fall and shortness of breath.  EXAM: CHEST  2 VIEW  COMPARISON:  Radiograph dated 03/01/2015  FINDINGS: The heart size and mediastinal contours are within normal limits. Both lungs are clear. The visualized skeletal structures are unremarkable.  IMPRESSION: No active cardiopulmonary disease.   Electronically Signed   By: Elgie Collard M.D.   On: 04/07/2015 02:18   Dg Elbow Complete  Left  04/07/2015   CLINICAL DATA:  59 year old male with trauma and pain in the left upper extremity.  EXAM: LEFT SHOULDER - 2+ VIEW; LEFT FOREARM - 2 VIEW; LEFT ELBOW - COMPLETE 3+ VIEW; LEFT HUMERUS - 2+ VIEW; LEFT WRIST - COMPLETE 3+ VIEW; LEFT HAND - COMPLETE 3+ VIEW  COMPARISON:  None.  FINDINGS: There is no evidence of fracture or dislocation. There is no evidence of arthropathy or other focal bone abnormality. Soft tissues are unremarkable.  There is diffuse soft tissue swelling of the distal forearm and hand. No radiopaque foreign object identified. Clinical correlation is recommended.  IMPRESSION: No acute fracture or dislocation.  Soft tissue swelling of the distal forearm and hand.   Electronically Signed   By: Elgie Collard M.D.   On: 04/07/2015 02:23   Dg Forearm Left  04/07/2015   CLINICAL DATA:  59 year old male with trauma and pain in the left upper extremity.  EXAM: LEFT SHOULDER - 2+ VIEW; LEFT FOREARM - 2 VIEW; LEFT ELBOW - COMPLETE 3+ VIEW; LEFT HUMERUS - 2+ VIEW; LEFT WRIST - COMPLETE 3+ VIEW; LEFT HAND - COMPLETE 3+ VIEW  COMPARISON:  None.  FINDINGS: There is no evidence of fracture or dislocation. There is no evidence of arthropathy or other focal bone abnormality. Soft tissues are unremarkable.  There is diffuse soft tissue swelling of the distal forearm and hand. No radiopaque foreign object identified. Clinical correlation is recommended.  IMPRESSION: No acute fracture or dislocation.  Soft tissue swelling of the distal forearm and hand.   Electronically Signed   By: Elgie Collard M.D.   On: 04/07/2015 02:23   Dg Wrist Complete Left  04/07/2015   CLINICAL DATA:  59 year old male with trauma and pain in the left upper extremity.  EXAM: LEFT SHOULDER - 2+ VIEW; LEFT FOREARM - 2 VIEW; LEFT ELBOW - COMPLETE 3+ VIEW; LEFT HUMERUS - 2+ VIEW; LEFT WRIST - COMPLETE 3+ VIEW; LEFT HAND - COMPLETE 3+ VIEW  COMPARISON:  None.  FINDINGS: There is no evidence of fracture or dislocation.  There is no evidence of arthropathy or other focal bone abnormality. Soft tissues are unremarkable.  There is diffuse soft tissue swelling of the distal forearm and hand.  No radiopaque foreign object identified. Clinical correlation is recommended.  IMPRESSION: No acute fracture or dislocation.  Soft tissue swelling of the distal forearm and hand.   Electronically Signed   By: Elgie Collard M.D.   On: 04/07/2015 02:23   Dg Shoulder Left  04/07/2015   CLINICAL DATA:  59 year old male with trauma and pain in the left upper extremity.  EXAM: LEFT SHOULDER - 2+ VIEW; LEFT FOREARM - 2 VIEW; LEFT ELBOW - COMPLETE 3+ VIEW; LEFT HUMERUS - 2+ VIEW; LEFT WRIST - COMPLETE 3+ VIEW; LEFT HAND - COMPLETE 3+ VIEW  COMPARISON:  None.  FINDINGS: There is no evidence of fracture or dislocation. There is no evidence of arthropathy or other focal bone abnormality. Soft tissues are unremarkable.  There is diffuse soft tissue swelling of the distal forearm and hand. No radiopaque foreign object identified. Clinical correlation is recommended.  IMPRESSION: No acute fracture or dislocation.  Soft tissue swelling of the distal forearm and hand.   Electronically Signed   By: Elgie Collard M.D.   On: 04/07/2015 02:23   Dg Knee Complete 4 Views Left  04/01/2015   CLINICAL DATA:  Patient fell at noon on 03/31/2015.  Left knee pain.  EXAM: LEFT KNEE - COMPLETE 4+ VIEW  COMPARISON:  12/30/2014  FINDINGS: Previous left total knee arthroplasty. Patellar femoral component is present. Components appear well seated. Mild diffuse soft tissue swelling similar to previous study. No significant effusion. No evidence of acute fracture or dislocation.  IMPRESSION: Postoperative left total knee arthroplasty. Components appear well seated. Diffuse soft tissue swelling similar to prior study. No acute bony abnormalities.   Electronically Signed   By: Burman Nieves M.D.   On: 04/01/2015 01:58   Dg Humerus Left  04/07/2015   CLINICAL DATA:   59 year old male with trauma and pain in the left upper extremity.  EXAM: LEFT SHOULDER - 2+ VIEW; LEFT FOREARM - 2 VIEW; LEFT ELBOW - COMPLETE 3+ VIEW; LEFT HUMERUS - 2+ VIEW; LEFT WRIST - COMPLETE 3+ VIEW; LEFT HAND - COMPLETE 3+ VIEW  COMPARISON:  None.  FINDINGS: There is no evidence of fracture or dislocation. There is no evidence of arthropathy or other focal bone abnormality. Soft tissues are unremarkable.  There is diffuse soft tissue swelling of the distal forearm and hand. No radiopaque foreign object identified. Clinical correlation is recommended.  IMPRESSION: No acute fracture or dislocation.  Soft tissue swelling of the distal forearm and hand.   Electronically Signed   By: Elgie Collard M.D.   On: 04/07/2015 02:23   Dg Hand Complete Left  04/07/2015   CLINICAL DATA:  59 year old male with trauma and pain in the left upper extremity.  EXAM: LEFT SHOULDER - 2+ VIEW; LEFT FOREARM - 2 VIEW; LEFT ELBOW - COMPLETE 3+ VIEW; LEFT HUMERUS - 2+ VIEW; LEFT WRIST - COMPLETE 3+ VIEW; LEFT HAND - COMPLETE 3+ VIEW  COMPARISON:  None.  FINDINGS: There is no evidence of fracture or dislocation. There is no evidence of arthropathy or other focal bone abnormality. Soft tissues are unremarkable.  There is diffuse soft tissue swelling of the distal forearm and hand. No radiopaque foreign object identified. Clinical correlation is recommended.  IMPRESSION: No acute fracture or dislocation.  Soft tissue swelling of the distal forearm and hand.   Electronically Signed   By: Elgie Collard M.D.   On: 04/07/2015 02:23    MDM Reviewed: previous chart, nursing note and vitals Reviewed previous: labs Interpretation: labs, ECG and x-ray (  hypokalemia, hypomagnesemia, acute renal failure.  elevation of WBC.  No fractures of the arm by me) Consults: pulmonary and admitting MD    Patient's BP is still very soft no narcotics.  I suspect he overuses narcotics and that is why he is out of them early.   CRITICAL  CARE Performed by: Jasmine Awe Total critical care time: 120 minutes Critical care time was exclusive of separately billable procedures and treating other patients. Critical care was necessary to treat or prevent imminent or life-threatening deterioration. Critical care was time spent personally by me on the following activities: development of treatment plan with patient and/or surrogate as well as nursing, discussions with consultants, evaluation of patient's response to treatment, examination of patient, obtaining history from patient or surrogate, ordering and performing treatments and interventions, ordering and review of laboratory studies, ordering and review of radiographic studies, pulse oximetry and re-evaluation of patient's condition.  I personally performed the services described in this documentation, which was scribed in my presence. The recorded information has been reviewed and is accurate.     Cy Blamer, MD 04/07/15 [phone]

## 2015-04-07 ENCOUNTER — Inpatient Hospital Stay (HOSPITAL_COMMUNITY): Payer: Medicaid Other

## 2015-04-07 ENCOUNTER — Emergency Department (HOSPITAL_BASED_OUTPATIENT_CLINIC_OR_DEPARTMENT_OTHER): Payer: Medicaid Other

## 2015-04-07 ENCOUNTER — Encounter (HOSPITAL_BASED_OUTPATIENT_CLINIC_OR_DEPARTMENT_OTHER): Payer: Self-pay | Admitting: Emergency Medicine

## 2015-04-07 DIAGNOSIS — S52502A Unspecified fracture of the lower end of left radius, initial encounter for closed fracture: Secondary | ICD-10-CM | POA: Diagnosis not present

## 2015-04-07 DIAGNOSIS — L02415 Cutaneous abscess of right lower limb: Secondary | ICD-10-CM | POA: Diagnosis not present

## 2015-04-07 DIAGNOSIS — A419 Sepsis, unspecified organism: Secondary | ICD-10-CM | POA: Diagnosis present

## 2015-04-07 DIAGNOSIS — M7989 Other specified soft tissue disorders: Secondary | ICD-10-CM | POA: Diagnosis present

## 2015-04-07 DIAGNOSIS — I82403 Acute embolism and thrombosis of unspecified deep veins of lower extremity, bilateral: Secondary | ICD-10-CM | POA: Diagnosis not present

## 2015-04-07 DIAGNOSIS — M79642 Pain in left hand: Secondary | ICD-10-CM | POA: Diagnosis present

## 2015-04-07 DIAGNOSIS — R6520 Severe sepsis without septic shock: Secondary | ICD-10-CM | POA: Diagnosis not present

## 2015-04-07 DIAGNOSIS — I96 Gangrene, not elsewhere classified: Secondary | ICD-10-CM | POA: Diagnosis not present

## 2015-04-07 DIAGNOSIS — E785 Hyperlipidemia, unspecified: Secondary | ICD-10-CM | POA: Diagnosis present

## 2015-04-07 DIAGNOSIS — W19XXXA Unspecified fall, initial encounter: Secondary | ICD-10-CM | POA: Diagnosis present

## 2015-04-07 DIAGNOSIS — R7881 Bacteremia: Secondary | ICD-10-CM | POA: Diagnosis not present

## 2015-04-07 DIAGNOSIS — R17 Unspecified jaundice: Secondary | ICD-10-CM | POA: Diagnosis present

## 2015-04-07 DIAGNOSIS — N179 Acute kidney failure, unspecified: Secondary | ICD-10-CM | POA: Diagnosis present

## 2015-04-07 DIAGNOSIS — T39395A Adverse effect of other nonsteroidal anti-inflammatory drugs [NSAID], initial encounter: Secondary | ICD-10-CM | POA: Diagnosis present

## 2015-04-07 DIAGNOSIS — R609 Edema, unspecified: Secondary | ICD-10-CM | POA: Diagnosis not present

## 2015-04-07 DIAGNOSIS — N10 Acute tubulo-interstitial nephritis: Secondary | ICD-10-CM | POA: Diagnosis not present

## 2015-04-07 DIAGNOSIS — B9561 Methicillin susceptible Staphylococcus aureus infection as the cause of diseases classified elsewhere: Secondary | ICD-10-CM | POA: Diagnosis present

## 2015-04-07 DIAGNOSIS — Z8711 Personal history of peptic ulcer disease: Secondary | ICD-10-CM | POA: Diagnosis not present

## 2015-04-07 DIAGNOSIS — S52592A Other fractures of lower end of left radius, initial encounter for closed fracture: Secondary | ICD-10-CM | POA: Diagnosis present

## 2015-04-07 DIAGNOSIS — G5721 Lesion of femoral nerve, right lower limb: Secondary | ICD-10-CM | POA: Diagnosis not present

## 2015-04-07 DIAGNOSIS — M96831 Postprocedural hemorrhage and hematoma of a musculoskeletal structure following other procedure: Secondary | ICD-10-CM | POA: Diagnosis not present

## 2015-04-07 DIAGNOSIS — L03114 Cellulitis of left upper limb: Secondary | ICD-10-CM | POA: Diagnosis not present

## 2015-04-07 DIAGNOSIS — I824Y1 Acute embolism and thrombosis of unspecified deep veins of right proximal lower extremity: Secondary | ICD-10-CM | POA: Diagnosis not present

## 2015-04-07 DIAGNOSIS — A4101 Sepsis due to Methicillin susceptible Staphylococcus aureus: Secondary | ICD-10-CM | POA: Diagnosis not present

## 2015-04-07 DIAGNOSIS — L02214 Cutaneous abscess of groin: Secondary | ICD-10-CM | POA: Diagnosis not present

## 2015-04-07 DIAGNOSIS — Z96653 Presence of artificial knee joint, bilateral: Secondary | ICD-10-CM | POA: Diagnosis present

## 2015-04-07 DIAGNOSIS — R6521 Severe sepsis with septic shock: Secondary | ICD-10-CM | POA: Diagnosis not present

## 2015-04-07 DIAGNOSIS — Z8614 Personal history of Methicillin resistant Staphylococcus aureus infection: Secondary | ICD-10-CM | POA: Diagnosis not present

## 2015-04-07 DIAGNOSIS — A4102 Sepsis due to Methicillin resistant Staphylococcus aureus: Secondary | ICD-10-CM | POA: Diagnosis not present

## 2015-04-07 DIAGNOSIS — I739 Peripheral vascular disease, unspecified: Secondary | ICD-10-CM | POA: Diagnosis present

## 2015-04-07 DIAGNOSIS — R34 Anuria and oliguria: Secondary | ICD-10-CM | POA: Diagnosis not present

## 2015-04-07 DIAGNOSIS — I471 Supraventricular tachycardia: Secondary | ICD-10-CM | POA: Diagnosis not present

## 2015-04-07 DIAGNOSIS — I339 Acute and subacute endocarditis, unspecified: Secondary | ICD-10-CM | POA: Diagnosis not present

## 2015-04-07 DIAGNOSIS — E876 Hypokalemia: Secondary | ICD-10-CM | POA: Diagnosis present

## 2015-04-07 DIAGNOSIS — I10 Essential (primary) hypertension: Secondary | ICD-10-CM | POA: Diagnosis present

## 2015-04-07 DIAGNOSIS — E43 Unspecified severe protein-calorie malnutrition: Secondary | ICD-10-CM | POA: Diagnosis not present

## 2015-04-07 DIAGNOSIS — L259 Unspecified contact dermatitis, unspecified cause: Secondary | ICD-10-CM | POA: Diagnosis not present

## 2015-04-07 DIAGNOSIS — I724 Aneurysm of artery of lower extremity: Secondary | ICD-10-CM | POA: Diagnosis present

## 2015-04-07 DIAGNOSIS — B9562 Methicillin resistant Staphylococcus aureus infection as the cause of diseases classified elsewhere: Secondary | ICD-10-CM | POA: Diagnosis present

## 2015-04-07 DIAGNOSIS — I743 Embolism and thrombosis of arteries of the lower extremities: Secondary | ICD-10-CM | POA: Diagnosis not present

## 2015-04-07 DIAGNOSIS — I82401 Acute embolism and thrombosis of unspecified deep veins of right lower extremity: Secondary | ICD-10-CM | POA: Diagnosis not present

## 2015-04-07 DIAGNOSIS — M79602 Pain in left arm: Secondary | ICD-10-CM | POA: Diagnosis not present

## 2015-04-07 DIAGNOSIS — Z6828 Body mass index (BMI) 28.0-28.9, adult: Secondary | ICD-10-CM | POA: Diagnosis not present

## 2015-04-07 DIAGNOSIS — R791 Abnormal coagulation profile: Secondary | ICD-10-CM | POA: Diagnosis present

## 2015-04-07 DIAGNOSIS — L0291 Cutaneous abscess, unspecified: Secondary | ICD-10-CM | POA: Diagnosis not present

## 2015-04-07 DIAGNOSIS — R5381 Other malaise: Secondary | ICD-10-CM | POA: Diagnosis not present

## 2015-04-07 DIAGNOSIS — L02512 Cutaneous abscess of left hand: Secondary | ICD-10-CM | POA: Diagnosis present

## 2015-04-07 DIAGNOSIS — I82411 Acute embolism and thrombosis of right femoral vein: Secondary | ICD-10-CM | POA: Diagnosis not present

## 2015-04-07 DIAGNOSIS — Z87891 Personal history of nicotine dependence: Secondary | ICD-10-CM | POA: Diagnosis not present

## 2015-04-07 DIAGNOSIS — N390 Urinary tract infection, site not specified: Secondary | ICD-10-CM | POA: Diagnosis present

## 2015-04-07 DIAGNOSIS — N170 Acute kidney failure with tubular necrosis: Secondary | ICD-10-CM | POA: Diagnosis not present

## 2015-04-07 DIAGNOSIS — D62 Acute posthemorrhagic anemia: Secondary | ICD-10-CM | POA: Diagnosis not present

## 2015-04-07 DIAGNOSIS — I97648 Postprocedural seroma of a circulatory system organ or structure following other circulatory system procedure: Secondary | ICD-10-CM | POA: Diagnosis not present

## 2015-04-07 DIAGNOSIS — I80291 Phlebitis and thrombophlebitis of other deep vessels of right lower extremity: Secondary | ICD-10-CM | POA: Diagnosis not present

## 2015-04-07 DIAGNOSIS — S5292XA Unspecified fracture of left forearm, initial encounter for closed fracture: Secondary | ICD-10-CM | POA: Diagnosis not present

## 2015-04-07 DIAGNOSIS — I9789 Other postprocedural complications and disorders of the circulatory system, not elsewhere classified: Secondary | ICD-10-CM | POA: Diagnosis not present

## 2015-04-07 DIAGNOSIS — G894 Chronic pain syndrome: Secondary | ICD-10-CM | POA: Diagnosis present

## 2015-04-07 DIAGNOSIS — F4321 Adjustment disorder with depressed mood: Secondary | ICD-10-CM | POA: Diagnosis not present

## 2015-04-07 DIAGNOSIS — G629 Polyneuropathy, unspecified: Secondary | ICD-10-CM | POA: Diagnosis present

## 2015-04-07 DIAGNOSIS — Z79899 Other long term (current) drug therapy: Secondary | ICD-10-CM | POA: Diagnosis not present

## 2015-04-07 DIAGNOSIS — I82441 Acute embolism and thrombosis of right tibial vein: Secondary | ICD-10-CM | POA: Diagnosis not present

## 2015-04-07 LAB — MRSA PCR SCREENING: MRSA by PCR: NEGATIVE

## 2015-04-07 LAB — DIC (DISSEMINATED INTRAVASCULAR COAGULATION)PANEL
INR: 1.93 — ABNORMAL HIGH (ref 0.00–1.49)
Prothrombin Time: 22 seconds — ABNORMAL HIGH (ref 11.6–15.2)
aPTT: 37 seconds (ref 24–37)

## 2015-04-07 LAB — CBC WITH DIFFERENTIAL/PLATELET
BAND NEUTROPHILS: 11 % — AB (ref 0–10)
BASOS ABS: 0 10*3/uL (ref 0.0–0.1)
BLASTS: 0 %
Basophils Relative: 0 % (ref 0–1)
EOS ABS: 0 10*3/uL (ref 0.0–0.7)
Eosinophils Relative: 0 % (ref 0–5)
HCT: 34 % — ABNORMAL LOW (ref 39.0–52.0)
HEMOGLOBIN: 11.6 g/dL — AB (ref 13.0–17.0)
LYMPHS PCT: 4 % — AB (ref 12–46)
Lymphs Abs: 1.3 10*3/uL (ref 0.7–4.0)
MCH: 26.3 pg (ref 26.0–34.0)
MCHC: 34.1 g/dL (ref 30.0–36.0)
MCV: 77.1 fL — ABNORMAL LOW (ref 78.0–100.0)
METAMYELOCYTES PCT: 1 %
Monocytes Absolute: 1.3 10*3/uL — ABNORMAL HIGH (ref 0.1–1.0)
Monocytes Relative: 4 % (ref 3–12)
Myelocytes: 1 %
Neutro Abs: 28.7 10*3/uL — ABNORMAL HIGH (ref 1.7–7.7)
Neutrophils Relative %: 79 % — ABNORMAL HIGH (ref 43–77)
OTHER: 0 %
PROMYELOCYTES ABS: 0 %
Platelets: 255 10*3/uL (ref 150–400)
RBC: 4.41 MIL/uL (ref 4.22–5.81)
RDW: 16.3 % — ABNORMAL HIGH (ref 11.5–15.5)
WBC: 31.3 10*3/uL — AB (ref 4.0–10.5)
nRBC: 0 /100 WBC

## 2015-04-07 LAB — BILIRUBIN, FRACTIONATED(TOT/DIR/INDIR)
BILIRUBIN DIRECT: 1.1 mg/dL — AB (ref 0.1–0.5)
BILIRUBIN TOTAL: 2 mg/dL — AB (ref 0.3–1.2)
Indirect Bilirubin: 0.9 mg/dL (ref 0.3–0.9)

## 2015-04-07 LAB — PROCALCITONIN: Procalcitonin: 9.72 ng/mL

## 2015-04-07 LAB — DIC (DISSEMINATED INTRAVASCULAR COAGULATION) PANEL
D DIMER QUANT: 4.71 ug{FEU}/mL — AB (ref 0.00–0.48)
PLATELETS: 230 10*3/uL (ref 150–400)
SMEAR REVIEW: NONE SEEN

## 2015-04-07 LAB — LACTIC ACID, PLASMA
Lactic Acid, Venous: 2.4 mmol/L (ref 0.5–2.0)
Lactic Acid, Venous: 2.8 mmol/L (ref 0.5–2.0)

## 2015-04-07 LAB — BASIC METABOLIC PANEL
Anion gap: 10 (ref 5–15)
BUN: 48 mg/dL — ABNORMAL HIGH (ref 6–20)
CALCIUM: 7.6 mg/dL — AB (ref 8.9–10.3)
CHLORIDE: 105 mmol/L (ref 101–111)
CO2: 17 mmol/L — AB (ref 22–32)
CREATININE: 3.3 mg/dL — AB (ref 0.61–1.24)
GFR calc Af Amer: 23 mL/min — ABNORMAL LOW (ref >60)
GFR calc non Af Amer: 19 mL/min — ABNORMAL LOW (ref >60)
GLUCOSE: 109 mg/dL — AB (ref 65–99)
Potassium: 3.4 mmol/L — ABNORMAL LOW (ref 3.5–5.1)
Sodium: 132 mmol/L — ABNORMAL LOW (ref 135–145)

## 2015-04-07 LAB — COMPREHENSIVE METABOLIC PANEL
ALBUMIN: 2.4 g/dL — AB (ref 3.5–5.0)
ALK PHOS: 129 U/L — AB (ref 38–126)
ALT: 21 U/L (ref 17–63)
AST: 34 U/L (ref 15–41)
Anion gap: 17 — ABNORMAL HIGH (ref 5–15)
BUN: 56 mg/dL — AB (ref 6–20)
CALCIUM: 8.3 mg/dL — AB (ref 8.9–10.3)
CO2: 17 mmol/L — AB (ref 22–32)
CREATININE: 4.47 mg/dL — AB (ref 0.61–1.24)
Chloride: 98 mmol/L — ABNORMAL LOW (ref 101–111)
GFR calc Af Amer: 16 mL/min — ABNORMAL LOW (ref >60)
GFR calc non Af Amer: 13 mL/min — ABNORMAL LOW (ref >60)
GLUCOSE: 141 mg/dL — AB (ref 65–99)
Potassium: 2.9 mmol/L — ABNORMAL LOW (ref 3.5–5.1)
SODIUM: 132 mmol/L — AB (ref 135–145)
Total Bilirubin: 2.3 mg/dL — ABNORMAL HIGH (ref 0.3–1.2)
Total Protein: 6.8 g/dL (ref 6.5–8.1)

## 2015-04-07 LAB — I-STAT CG4 LACTIC ACID, ED
Lactic Acid, Venous: 2.45 mmol/L (ref 0.5–2.0)
Lactic Acid, Venous: 4.16 mmol/L (ref 0.5–2.0)

## 2015-04-07 LAB — URINE MICROSCOPIC-ADD ON

## 2015-04-07 LAB — PROTIME-INR
INR: 2.26 — AB (ref 0.00–1.49)
Prothrombin Time: 24.7 seconds — ABNORMAL HIGH (ref 11.6–15.2)

## 2015-04-07 LAB — CREATININE, URINE, RANDOM: CREATININE, URINE: 123.18 mg/dL

## 2015-04-07 LAB — URINALYSIS, ROUTINE W REFLEX MICROSCOPIC
Glucose, UA: NEGATIVE mg/dL
Ketones, ur: 15 mg/dL — AB
NITRITE: POSITIVE — AB
PH: 5 (ref 5.0–8.0)
Protein, ur: 100 mg/dL — AB
SPECIFIC GRAVITY, URINE: 1.019 (ref 1.005–1.030)
Urobilinogen, UA: 1 mg/dL (ref 0.0–1.0)

## 2015-04-07 LAB — RAPID URINE DRUG SCREEN, HOSP PERFORMED
AMPHETAMINES: NOT DETECTED
Barbiturates: NOT DETECTED
Benzodiazepines: POSITIVE — AB
Cocaine: NOT DETECTED
OPIATES: POSITIVE — AB
TETRAHYDROCANNABINOL: NOT DETECTED

## 2015-04-07 LAB — CK: Total CK: 495 U/L — ABNORMAL HIGH (ref 49–397)

## 2015-04-07 LAB — POTASSIUM: Potassium: 2.7 mmol/L — CL (ref 3.5–5.1)

## 2015-04-07 LAB — SALICYLATE LEVEL

## 2015-04-07 LAB — ACETAMINOPHEN LEVEL: Acetaminophen (Tylenol), Serum: <10 ug/mL — ABNORMAL LOW (ref 10–30)

## 2015-04-07 LAB — SODIUM, URINE, RANDOM

## 2015-04-07 LAB — MAGNESIUM: Magnesium: 1.3 mg/dL — ABNORMAL LOW (ref 1.7–2.4)

## 2015-04-07 LAB — APTT: APTT: 39 s — AB (ref 24–37)

## 2015-04-07 LAB — TROPONIN I: Troponin I: <0.03 ng/mL (ref <0.031)

## 2015-04-07 MED ORDER — PIPERACILLIN-TAZOBACTAM 3.375 G IVPB
3.3750 g | Freq: Three times a day (TID) | INTRAVENOUS | Status: DC
  Administered 2015-04-07 – 2015-04-09 (×6): 3.375 g via INTRAVENOUS
  Filled 2015-04-07 (×8): qty 50

## 2015-04-07 MED ORDER — HEPARIN SODIUM (PORCINE) 5000 UNIT/ML IJ SOLN
5000.0000 [IU] | Freq: Three times a day (TID) | INTRAMUSCULAR | Status: DC
  Administered 2015-04-07: 5000 [IU] via SUBCUTANEOUS
  Filled 2015-04-07: qty 1

## 2015-04-07 MED ORDER — SODIUM CHLORIDE 0.9 % IV BOLUS (SEPSIS)
1000.0000 mL | Freq: Once | INTRAVENOUS | Status: AC
  Administered 2015-04-07: 1000 mL via INTRAVENOUS

## 2015-04-07 MED ORDER — MORPHINE SULFATE ER 30 MG PO TBCR
90.0000 mg | EXTENDED_RELEASE_TABLET | ORAL | Status: DC
  Administered 2015-04-07 – 2015-04-30 (×43): 90 mg via ORAL
  Filled 2015-04-07: qty 1
  Filled 2015-04-07 (×3): qty 2
  Filled 2015-04-07 (×5): qty 1
  Filled 2015-04-07: qty 3
  Filled 2015-04-07 (×3): qty 1
  Filled 2015-04-07: qty 3
  Filled 2015-04-07: qty 2
  Filled 2015-04-07: qty 1
  Filled 2015-04-07 (×2): qty 2
  Filled 2015-04-07 (×2): qty 1
  Filled 2015-04-07 (×2): qty 2
  Filled 2015-04-07 (×2): qty 1
  Filled 2015-04-07 (×4): qty 2
  Filled 2015-04-07: qty 1
  Filled 2015-04-07 (×2): qty 2
  Filled 2015-04-07: qty 1
  Filled 2015-04-07 (×2): qty 2
  Filled 2015-04-07: qty 1
  Filled 2015-04-07: qty 2
  Filled 2015-04-07: qty 3
  Filled 2015-04-07: qty 1
  Filled 2015-04-07: qty 2
  Filled 2015-04-07: qty 1
  Filled 2015-04-07: qty 2
  Filled 2015-04-07 (×2): qty 1
  Filled 2015-04-07: qty 3
  Filled 2015-04-07 (×4): qty 2
  Filled 2015-04-07 (×3): qty 1
  Filled 2015-04-07 (×5): qty 2
  Filled 2015-04-07 (×2): qty 1
  Filled 2015-04-07: qty 2
  Filled 2015-04-07 (×2): qty 1
  Filled 2015-04-07: qty 3
  Filled 2015-04-07: qty 1
  Filled 2015-04-07: qty 2
  Filled 2015-04-07: qty 3
  Filled 2015-04-07: qty 1
  Filled 2015-04-07: qty 2
  Filled 2015-04-07: qty 1
  Filled 2015-04-07: qty 2
  Filled 2015-04-07: qty 3
  Filled 2015-04-07 (×2): qty 1
  Filled 2015-04-07: qty 2
  Filled 2015-04-07 (×2): qty 1
  Filled 2015-04-07 (×3): qty 2
  Filled 2015-04-07: qty 3
  Filled 2015-04-07 (×2): qty 1
  Filled 2015-04-07 (×2): qty 2
  Filled 2015-04-07: qty 1

## 2015-04-07 MED ORDER — OXYCODONE-ACETAMINOPHEN 5-325 MG PO TABS
1.0000 | ORAL_TABLET | Freq: Four times a day (QID) | ORAL | Status: DC | PRN
  Administered 2015-04-08: 1 via ORAL
  Filled 2015-04-07: qty 1

## 2015-04-07 MED ORDER — PIPERACILLIN-TAZOBACTAM 3.375 G IVPB 30 MIN
3.3750 g | Freq: Once | INTRAVENOUS | Status: AC
  Administered 2015-04-07: 3.375 g via INTRAVENOUS
  Filled 2015-04-07 (×2): qty 50

## 2015-04-07 MED ORDER — MORPHINE SULFATE ER 100 MG PO TBCR
100.0000 mg | EXTENDED_RELEASE_TABLET | Freq: Two times a day (BID) | ORAL | Status: DC
  Filled 2015-04-07: qty 1

## 2015-04-07 MED ORDER — NALOXEGOL OXALATE 25 MG PO TABS
25.0000 mg | ORAL_TABLET | Freq: Two times a day (BID) | ORAL | Status: DC
  Administered 2015-04-07: 25 mg via ORAL
  Filled 2015-04-07 (×2): qty 1

## 2015-04-07 MED ORDER — PIPERACILLIN-TAZOBACTAM 3.375 G IVPB 30 MIN
3.3750 g | Freq: Once | INTRAVENOUS | Status: DC
  Filled 2015-04-07: qty 50

## 2015-04-07 MED ORDER — ACETAMINOPHEN 325 MG PO TABS
650.0000 mg | ORAL_TABLET | Freq: Four times a day (QID) | ORAL | Status: DC | PRN
  Administered 2015-04-09: 650 mg via ORAL
  Administered 2015-04-09: 325 mg via ORAL
  Administered 2015-04-11 – 2015-04-19 (×5): 650 mg via ORAL
  Filled 2015-04-07 (×9): qty 2

## 2015-04-07 MED ORDER — VANCOMYCIN HCL IN DEXTROSE 1-5 GM/200ML-% IV SOLN
1000.0000 mg | Freq: Once | INTRAVENOUS | Status: AC
  Administered 2015-04-07: 1000 mg via INTRAVENOUS
  Filled 2015-04-07: qty 200

## 2015-04-07 MED ORDER — MORPHINE SULFATE ER 15 MG PO TBCR: 30.0000 mg | EXTENDED_RELEASE_TABLET | Freq: Two times a day (BID) | ORAL | Status: DC

## 2015-04-07 MED ORDER — OXYCODONE HCL 5 MG PO TABS
30.0000 mg | ORAL_TABLET | ORAL | Status: DC
  Administered 2015-04-07 – 2015-04-08 (×2): 30 mg via ORAL
  Filled 2015-04-07 (×2): qty 6

## 2015-04-07 MED ORDER — NALOXEGOL OXALATE 25 MG PO TABS
25.0000 mg | ORAL_TABLET | Freq: Every day | ORAL | Status: DC
  Administered 2015-04-08 – 2015-04-30 (×19): 25 mg via ORAL
  Filled 2015-04-07 (×24): qty 1

## 2015-04-07 MED ORDER — HYDROMORPHONE HCL 2 MG PO TABS
4.0000 mg | ORAL_TABLET | ORAL | Status: DC | PRN
  Administered 2015-04-07: 4 mg via ORAL
  Filled 2015-04-07 (×2): qty 2

## 2015-04-07 MED ORDER — SODIUM CHLORIDE 0.9 % IV SOLN
1000.0000 mL | Freq: Once | INTRAVENOUS | Status: AC
  Administered 2015-04-06: 1000 mL via INTRAVENOUS

## 2015-04-07 MED ORDER — SODIUM CHLORIDE 0.9 % IV SOLN
INTRAVENOUS | Status: DC
Start: 2015-04-07 — End: 2015-04-13
  Administered 2015-04-07 – 2015-04-13 (×12): via INTRAVENOUS

## 2015-04-07 MED ORDER — MAGNESIUM SULFATE 2 GM/50ML IV SOLN
2.0000 g | Freq: Once | INTRAVENOUS | Status: AC
  Administered 2015-04-07: 2 g via INTRAVENOUS
  Filled 2015-04-07: qty 50

## 2015-04-07 MED ORDER — POTASSIUM CHLORIDE 10 MEQ/100ML IV SOLN
10.0000 meq | INTRAVENOUS | Status: AC
  Administered 2015-04-07 (×4): 10 meq via INTRAVENOUS
  Filled 2015-04-07 (×3): qty 100

## 2015-04-07 MED ORDER — SODIUM CHLORIDE 0.9 % IJ SOLN
3.0000 mL | Freq: Two times a day (BID) | INTRAMUSCULAR | Status: DC
Start: 2015-04-07 — End: 2015-04-30
  Administered 2015-04-07 – 2015-04-19 (×16): 3 mL via INTRAVENOUS
  Administered 2015-04-20: 10 mL via INTRAVENOUS
  Administered 2015-04-20: 3 mL via INTRAVENOUS
  Administered 2015-04-21: 30 mL via INTRAVENOUS
  Administered 2015-04-21 – 2015-04-29 (×15): 3 mL via INTRAVENOUS

## 2015-04-07 MED ORDER — VANCOMYCIN HCL 10 G IV SOLR
1250.0000 mg | INTRAVENOUS | Status: DC
  Administered 2015-04-08 – 2015-04-11 (×4): 1250 mg via INTRAVENOUS
  Filled 2015-04-07 (×5): qty 1250

## 2015-04-07 MED ORDER — SODIUM CHLORIDE 0.9 % IV SOLN
1250.0000 mg | Freq: Once | INTRAVENOUS | Status: AC
  Administered 2015-04-07: 1250 mg via INTRAVENOUS
  Filled 2015-04-07: qty 1250

## 2015-04-07 MED ORDER — SODIUM CHLORIDE 0.9 % IV SOLN
1000.0000 mL | INTRAVENOUS | Status: DC
  Administered 2015-04-07: 1000 mL via INTRAVENOUS

## 2015-04-07 MED ORDER — SODIUM CHLORIDE 0.9 % IV BOLUS (SEPSIS)
500.0000 mL | Freq: Once | INTRAVENOUS | Status: AC
  Administered 2015-04-07: 500 mL via INTRAVENOUS

## 2015-04-07 MED ORDER — VANCOMYCIN HCL IN DEXTROSE 1-5 GM/200ML-% IV SOLN
1000.0000 mg | Freq: Once | INTRAVENOUS | Status: DC
  Filled 2015-04-07: qty 200

## 2015-04-07 MED ORDER — INFLUENZA VAC SPLIT QUAD 0.5 ML IM SUSY
0.5000 mL | PREFILLED_SYRINGE | INTRAMUSCULAR | Status: AC
  Administered 2015-04-08: 0.5 mL via INTRAMUSCULAR
  Filled 2015-04-07: qty 0.5

## 2015-04-07 MED ORDER — MAGNESIUM SULFATE 4 GM/100ML IV SOLN: 4.0000 g | Freq: Once | INTRAVENOUS | Status: DC

## 2015-04-07 MED ORDER — POTASSIUM CHLORIDE CRYS ER 20 MEQ PO TBCR
40.0000 meq | EXTENDED_RELEASE_TABLET | Freq: Once | ORAL | Status: AC
  Administered 2015-04-07: 40 meq via ORAL
  Filled 2015-04-07: qty 2

## 2015-04-07 MED ORDER — ALBUTEROL SULFATE (2.5 MG/3ML) 0.083% IN NEBU: 3.0000 mL | INHALATION_SOLUTION | Freq: Four times a day (QID) | RESPIRATORY_TRACT | Status: DC | PRN

## 2015-04-07 MED ORDER — CLONAZEPAM 1 MG PO TABS
2.0000 mg | ORAL_TABLET | Freq: Four times a day (QID) | ORAL | Status: DC | PRN
  Administered 2015-04-07 – 2015-04-08 (×5): 2 mg via ORAL
  Filled 2015-04-07 (×5): qty 2

## 2015-04-07 MED ORDER — POTASSIUM CHLORIDE 10 MEQ/100ML IV SOLN
INTRAVENOUS | Status: AC
  Filled 2015-04-07: qty 100

## 2015-04-07 MED ORDER — TETANUS-DIPHTH-ACELL PERTUSSIS 5-2.5-18.5 LF-MCG/0.5 IM SUSP: 0.5000 mL | Freq: Once | INTRAMUSCULAR | Status: DC

## 2015-04-07 MED ORDER — SODIUM CHLORIDE 0.9 % IV SOLN
1000.0000 mL | Freq: Once | INTRAVENOUS | Status: AC
  Administered 2015-04-07: 1000 mL via INTRAVENOUS

## 2015-04-07 MED ORDER — KETOROLAC TROMETHAMINE 30 MG/ML IJ SOLN
30.0000 mg | Freq: Once | INTRAMUSCULAR | Status: AC
  Administered 2015-04-07: 30 mg via INTRAVENOUS
  Filled 2015-04-07: qty 1

## 2015-04-07 MED ORDER — FAMCICLOVIR 500 MG PO TABS
500.0000 mg | ORAL_TABLET | Freq: Two times a day (BID) | ORAL | Status: DC | PRN
  Filled 2015-04-07: qty 1

## 2015-04-07 MED ORDER — DOCUSATE SODIUM 100 MG PO CAPS
100.0000 mg | ORAL_CAPSULE | Freq: Two times a day (BID) | ORAL | Status: DC
  Administered 2015-04-07 – 2015-04-30 (×42): 100 mg via ORAL
  Filled 2015-04-07 (×43): qty 1

## 2015-04-07 MED ORDER — ACETAMINOPHEN 650 MG RE SUPP
650.0000 mg | Freq: Four times a day (QID) | RECTAL | Status: DC | PRN
  Filled 2015-04-07: qty 1

## 2015-04-07 NOTE — ED Notes (Signed)
Pt notified that urine sample is needed, pt states that he has only voided x1 today and feels that he cant.  Will re-evaluate once pt is back from radiology and has had more of his IV fluids.

## 2015-04-07 NOTE — Progress Notes (Signed)
Orthopedic Tech Progress Note Patient Details:  [name] 1956-03-09 [id number] Applied Velcro wrist splint to LUE. Ortho Devices Type of Ortho Device: Wrist splint Ortho Device/Splint Location: LUE Ortho Device/Splint Interventions: Application   Lesle Chris 04/07/2015, 4:57 PM

## 2015-04-07 NOTE — H&P (Signed)
[facility] History and Physical                                                                                    [name], is a 59 y.o. male  MRN: [id number]   DOB - Nov 26, 1955  Admit Date - 04/06/2015  Outpatient Primary MD for the patient is Craig Mane, MD   Pain Management Physician:  Dr. [name], MD  Referring Physician:    Chief Complaint:   Chief Complaint  Patient presents with  . Tachycardia     HPI  [name]  is a 59 y.o. male, with PUD, HLD, and a history of MRSA abscesses, peripheral neuropathy and chronic pain. He is a retired Personal assistant.  He presented to [facility] with severe left hand pain. He was found to be septic with acute kidney injury and left hand cellulitis. After admission he was noted to have a dusky swollen right foot. The patient reports that approximately 2 weeks ago he started feeling poorly and eating less. Wednesday evening (9/7) he could not feel his right foot and fell as a result. He landed on his left wrist, shoulder, and back side. Since then his left hand has been very painful and his wrist and arm had begun to swell. He has developed thick erythematous streaking on his left arm. He reports that he is only urinated twice in the past 4 days. He denies respiratory symptoms or chest pain. His wife notes that she saw an unusual rash on his right foot this morning (the patient had not mentioned it).    In the ER:  Lactic acid was 4.16, INR is 2.26 (not on anticoag), PTT is 24.7, WBC is 31.3, RBC morphology shows Burr Cells, mag 1.3, potassium 2.7, procalcitonin 9.72. BP was 80/68 He received 3L of IV bolus in the ER.  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  Reportedly the patient injected his groin with testosterone 3 weeks ago.   Review of Systems   In addition to the HPI above,  No Fever-chills, No Headache, No changes with Vision or hearing, No problems  swallowing food or Liquids, No Chest pain, Cough or Shortness of Breath, No Abdominal pain, No Nausea or Vomiting, Bowel movements are regular, No Blood in stool or Urine, ++ Oliguria ++ Lost feeling in right foot and developed purple rash. No recent weight gain or loss, A full 10 point Review of Systems was done, except as stated above, all other Review of Systems were negative.  Past Medical History  Past Medical History  Diagnosis Date  . Hypertension   . Chronic knee pain   . Hyperlipemia   . PUD (peptic ulcer disease)     Past Surgical History  Procedure Laterality Date  . I&d extremity Left 07/12/2013    Procedure: I&D Left Thigh Abscess;  Surgeon: [name], MD;  Location: [facility] OR;  Service: Orthopedics;  Laterality: Left;  . Replacement total knee bilateral    . Tonsillectomy    . Hernia repair    . Cholecystectomy    . Appendectomy    .  Shoulder surgery    . Elbow surgery      Social History Social History  Substance Use Topics  . Smoking status: Former Smoker    Types: Cigarettes  . Smokeless tobacco: Not on file  . Alcohol Use: No  Ex tob use.  Independent with ADLs.  Disabled - no longer working.  Family History Family History  Problem Relation Age of Onset  . Heart attack Paternal Grandmother 58  . Heart disease Mother 59    arrythmia  . Stroke Paternal Grandfather   . Stroke Maternal Grandfather     Prior to Admission medications   Medication Sig Start Date End Date Taking? Authorizing Provider  albuterol (PROAIR HFA) 108 (90 BASE) MCG/ACT inhaler Inhale 1 puff into the lungs every 6 (six) hours as needed for wheezing or shortness of breath.  09/06/13   Historical Provider, MD  atenolol (TENORMIN) 100 MG tablet Take 100 mg by mouth daily.    Historical Provider, MD  atorvastatin (LIPITOR) 80 MG tablet Take 80 mg by mouth daily.    Historical Provider, MD  clonazePAM (KLONOPIN) 2 MG tablet Take 2 mg by mouth 4 (four) times daily.    Historical  Provider, MD  colchicine 0.6 MG tablet Take 1 tablet (0.6 mg total) by mouth 2 (two) times daily. 02/25/15   Ames Dura, MD  Diclofenac-Misoprostol 75-0.2 MG TBEC Take 1 tablet by mouth 2 (two) times daily. 02/21/15   Historical Provider, MD  famciclovir (FAMVIR) 500 MG tablet Take 500 mg by mouth 2 (two) times daily as needed (outbreak).  10/25/13   Historical Provider, MD  ibuprofen (ADVIL,MOTRIN) 800 MG tablet Take 1 tablet (800 mg total) by mouth 3 (three) times daily. 02/25/15   Ames Dura, MD  morphine (MS CONTIN) 30 MG 12 hr tablet Take 30 mg by mouth 2 (two) times daily. 02/11/15   Historical Provider, MD  morphine (MS CONTIN) 60 MG 12 hr tablet Take 60 mg by mouth 2 (two) times daily. 02/11/15   Historical Provider, MD  naloxegol oxalate (MOVANTIK) 25 MG TABS tablet Take 25 mg by mouth 2 (two) times daily. 09/18/14   Historical Provider, MD  ondansetron (ZOFRAN) 8 MG tablet Take 8 mg by mouth daily as needed for nausea or vomiting.  12/25/14   Historical Provider, MD  oxycodone (ROXICODONE) 30 MG immediate release tablet Take 30 mg by mouth 4 (four) times daily.     Historical Provider, MD  oxyCODONE-acetaminophen (PERCOCET/ROXICET) 5-325 MG per tablet Take 1 tablet by mouth every 6 (six) hours as needed for severe pain. 02/25/15   Ames Dura, MD  SUMAtriptan (IMITREX) 50 MG tablet Take 50 mg by mouth daily as needed for migraine or headache.  02/15/15   Historical Provider, MD    Allergies  Allergen Reactions  . Imitrex [Sumatriptan]     Chest pain, 2004  . Verapamil     Physical Exam  Vitals  Blood pressure 100/51, pulse 130, temperature 98 F (36.7 C), temperature source Oral, resp. rate 19, height 6\' 3"  (1.905 m), weight 103.42 kg (228 lb), SpO2 100 %.   General:  Wd, Timothy Lasso, pleasant male, appears acutely ill, lying in bed in mild / mod pain.  Psych:  Normal affect and insight, Not Suicidal or Homicidal, Awake Alert, Oriented X 3.  Neuro:   Unable to grip with left hand due  to pain, minimal sensation in right lower extremity.  CN2-12 grossly in tact.  ENT:  Ears and Eyes appear Normal, Conjunctivae  clear, PER. Dry mucous membranes.  Neck:  Supple, No lymphadenopathy appreciated  Respiratory:  Symmetrical chest wall movement, Good air movement bilaterally, CTAB.  Cardiac:  tachycardic, No Murmurs, no JVD.    Abdomen:  Positive bowel sounds, Soft, Non tender, Non distended,  No masses appreciated  Skin:   Normal Skin Turgor, please see extremities:  Extremities:  LUE has swelling 2+ below the elbow thru the dorsum of the hand.  Thick erythematous streaking over left hand and arm. Distal right lower extremity is off-color, swollen, with purple disseminated rash. Lesions appear to be half centimeter and annular focused on the right instep. Right third digit is particularly swollen and gray  Data Review  CBC  Recent Labs Lab 04/07/15 0008  WBC 31.3*  HGB 11.6*  HCT 34.0*  PLT 255  MCV 77.1*  MCH 26.3  MCHC 34.1  RDW 16.3*  LYMPHSABS 1.3  MONOABS 1.3*  EOSABS 0.0  BASOSABS 0.0    Chemistries   Recent Labs Lab 04/07/15 0008 04/07/15 0113 04/07/15 0737  NA 132*  --  132*  K 2.9* 2.7* 3.4*  CL 98*  --  105  CO2 17*  --  17*  GLUCOSE 141*  --  109*  BUN 56*  --  48*  CREATININE 4.47*  --  3.30*  CALCIUM 8.3*  --  7.6*  MG  --  1.3*  --   AST 34  --   --   ALT 21  --   --   ALKPHOS 129*  --   --   BILITOT 2.3*  --   --      Coagulation profile  Recent Labs Lab 04/07/15 0737  INR 2.26*    Cardiac Enzymes  Recent Labs Lab 04/07/15 0008  TROPONINI <0.03     Urinalysis    Component Value Date/Time   COLORURINE ORANGE* 04/07/2015 0147   APPEARANCEUR TURBID* 04/07/2015 0147   LABSPEC 1.019 04/07/2015 0147   PHURINE 5.0 04/07/2015 0147   GLUCOSEU NEGATIVE 04/07/2015 0147   HGBUR MODERATE* 04/07/2015 0147   BILIRUBINUR MODERATE* 04/07/2015 0147   KETONESUR 15* 04/07/2015 0147   PROTEINUR 100* 04/07/2015 0147    UROBILINOGEN 1.0 04/07/2015 0147   NITRITE POSITIVE* 04/07/2015 0147   LEUKOCYTESUR SMALL* 04/07/2015 0147    Imaging results:   Dg Chest 2 View  04/07/2015   CLINICAL DATA:  59 year old male with recent fall and shortness of breath.  EXAM: CHEST  2 VIEW  COMPARISON:  Radiograph dated 03/01/2015  FINDINGS: The heart size and mediastinal contours are within normal limits. Both lungs are clear. The visualized skeletal structures are unremarkable.  IMPRESSION: No active cardiopulmonary disease.   Electronically Signed   By: Elgie Collard M.D.   On: 04/07/2015 02:18   Dg Elbow Complete Left  04/07/2015   CLINICAL DATA:  59 year old male with trauma and pain in the left upper extremity.  EXAM: LEFT SHOULDER - 2+ VIEW; LEFT FOREARM - 2 VIEW; LEFT ELBOW - COMPLETE 3+ VIEW; LEFT HUMERUS - 2+ VIEW; LEFT WRIST - COMPLETE 3+ VIEW; LEFT HAND - COMPLETE 3+ VIEW  COMPARISON:  None.  FINDINGS: There is no evidence of fracture or dislocation. There is no evidence of arthropathy or other focal bone abnormality. Soft tissues are unremarkable.  There is diffuse soft tissue swelling of the distal forearm and hand. No radiopaque foreign object identified. Clinical correlation is recommended.  IMPRESSION: No acute fracture or dislocation.  Soft tissue swelling of the distal forearm and hand.  Electronically Signed   By: Elgie Collard M.D.   On: 04/07/2015 02:23   Dg Forearm Left  04/07/2015   CLINICAL DATA:  59 year old male with trauma and pain in the left upper extremity.  EXAM: LEFT SHOULDER - 2+ VIEW; LEFT FOREARM - 2 VIEW; LEFT ELBOW - COMPLETE 3+ VIEW; LEFT HUMERUS - 2+ VIEW; LEFT WRIST - COMPLETE 3+ VIEW; LEFT HAND - COMPLETE 3+ VIEW  COMPARISON:  None.  FINDINGS: There is no evidence of fracture or dislocation. There is no evidence of arthropathy or other focal bone abnormality. Soft tissues are unremarkable.  There is diffuse soft tissue swelling of the distal forearm and hand. No radiopaque foreign object  identified. Clinical correlation is recommended.  IMPRESSION: No acute fracture or dislocation.  Soft tissue swelling of the distal forearm and hand.   Electronically Signed   By: Elgie Collard M.D.   On: 04/07/2015 02:23   Dg Wrist Complete Left  04/07/2015   CLINICAL DATA:  59 year old male with trauma and pain in the left upper extremity.  EXAM: LEFT SHOULDER - 2+ VIEW; LEFT FOREARM - 2 VIEW; LEFT ELBOW - COMPLETE 3+ VIEW; LEFT HUMERUS - 2+ VIEW; LEFT WRIST - COMPLETE 3+ VIEW; LEFT HAND - COMPLETE 3+ VIEW  COMPARISON:  None.  FINDINGS: There is no evidence of fracture or dislocation. There is no evidence of arthropathy or other focal bone abnormality. Soft tissues are unremarkable.  There is diffuse soft tissue swelling of the distal forearm and hand. No radiopaque foreign object identified. Clinical correlation is recommended.  IMPRESSION: No acute fracture or dislocation.  Soft tissue swelling of the distal forearm and hand.   Electronically Signed   By: Elgie Collard M.D.   On: 04/07/2015 02:23   Dg Shoulder Left  04/07/2015   CLINICAL DATA:  59 year old male with trauma and pain in the left upper extremity.  EXAM: LEFT SHOULDER - 2+ VIEW; LEFT FOREARM - 2 VIEW; LEFT ELBOW - COMPLETE 3+ VIEW; LEFT HUMERUS - 2+ VIEW; LEFT WRIST - COMPLETE 3+ VIEW; LEFT HAND - COMPLETE 3+ VIEW  COMPARISON:  None.  FINDINGS: There is no evidence of fracture or dislocation. There is no evidence of arthropathy or other focal bone abnormality. Soft tissues are unremarkable.  There is diffuse soft tissue swelling of the distal forearm and hand. No radiopaque foreign object identified. Clinical correlation is recommended.  IMPRESSION: No acute fracture or dislocation.  Soft tissue swelling of the distal forearm and hand.   Electronically Signed   By: Elgie Collard M.D.   On: 04/07/2015 02:23   Dg Knee Complete 4 Views Left  04/01/2015   CLINICAL DATA:  Patient fell at noon on 03/31/2015.  Left knee pain.  EXAM: LEFT  KNEE - COMPLETE 4+ VIEW  COMPARISON:  12/30/2014  FINDINGS: Previous left total knee arthroplasty. Patellar femoral component is present. Components appear well seated. Mild diffuse soft tissue swelling similar to previous study. No significant effusion. No evidence of acute fracture or dislocation.  IMPRESSION: Postoperative left total knee arthroplasty. Components appear well seated. Diffuse soft tissue swelling similar to prior study. No acute bony abnormalities.   Electronically Signed   By: Burman Nieves M.D.   On: 04/01/2015 01:58   Dg Humerus Left  04/07/2015   CLINICAL DATA:  59 year old male with trauma and pain in the left upper extremity.  EXAM: LEFT SHOULDER - 2+ VIEW; LEFT FOREARM - 2 VIEW; LEFT ELBOW - COMPLETE 3+ VIEW; LEFT HUMERUS - 2+ VIEW;  LEFT WRIST - COMPLETE 3+ VIEW; LEFT HAND - COMPLETE 3+ VIEW  COMPARISON:  None.  FINDINGS: There is no evidence of fracture or dislocation. There is no evidence of arthropathy or other focal bone abnormality. Soft tissues are unremarkable.  There is diffuse soft tissue swelling of the distal forearm and hand. No radiopaque foreign object identified. Clinical correlation is recommended.  IMPRESSION: No acute fracture or dislocation.  Soft tissue swelling of the distal forearm and hand.   Electronically Signed   By: Elgie Collard M.D.   On: 04/07/2015 02:23   Dg Hand Complete Left  04/07/2015   CLINICAL DATA:  59 year old male with trauma and pain in the left upper extremity.  EXAM: LEFT SHOULDER - 2+ VIEW; LEFT FOREARM - 2 VIEW; LEFT ELBOW - COMPLETE 3+ VIEW; LEFT HUMERUS - 2+ VIEW; LEFT WRIST - COMPLETE 3+ VIEW; LEFT HAND - COMPLETE 3+ VIEW  COMPARISON:  None.  FINDINGS: There is no evidence of fracture or dislocation. There is no evidence of arthropathy or other focal bone abnormality. Soft tissues are unremarkable.  There is diffuse soft tissue swelling of the distal forearm and hand. No radiopaque foreign object identified. Clinical correlation is  recommended.  IMPRESSION: No acute fracture or dislocation.  Soft tissue swelling of the distal forearm and hand.   Electronically Signed   By: Elgie Collard M.D.   On: 04/07/2015 02:23    My personal review of EKG: sinus tach at 149   Assessment & Plan  Principal Problem:   Severe sepsis Active Problems:   Femoral artery aneurysm, right   Chronic pain syndrome   History of duodenal ulcer   ARF (acute renal failure)   Cellulitis of left hand   Elevated INR   Foot swelling   Elevated bilirubin   Hypokalemia   Hypomagnesemia   Severe sepsis Possibly multifactorial: Left hand cellulitis and severe UTI/pyelonephritis Patient was hypotensive, tachycardic, with a white count of 31.3 on initial workup. Blood and urine cultures been obtained. He received 5 L of IV fluids in the ER and his blood pressure stabilized. Sepsis order set utilized.  Admitted to stepdown. This patient is severely ill and could easily decompensate. Low threshold for calling critical care consult.  Right femoral artery aneurysm with lower extremity numbness, swelling and Purpura on right foot Found by vascular ultrasound tech. Patient injected his right groin with testosterone 3 weeks ago he now has a large 4 cm pulsating aneurysm. Have placed a stat consult with vascular surgery. Dr. Imogene Burn.  When patient's kidney function will tolerate it he will need a right lower extremity angiogram.  Will order an arterial duplex. Per Dr. Imogene Burn the patient is not stable for surgical intervention at this point.  He will write a formal consult on 9/12.  Acute renal failure with pyelonephritis. Patient with oliguria. UA positive for infection. Creatinine is 0.9 at baseline. 4.4 on admission.  Hold any nephrotoxic medications.  Continue IV fluids. Follow bmet. Strict I's and Os.  Left hand cellulitis with history of MRSA abscess X-rays show no fracture. [name], hand surgeon has seen the patient and requests an MRI to rule out  deep abscess Will trace erythema. Continue Vanco and Zosyn. Supportive care to hand and arm. Appreciate Dr. Debby Bud assistance. Have ordered PO dilaudid in addition to chronic pain meds for breakthru pain.  Elevated INR with coagulopathy INR 2.2, PTT 24.7, APTT 39. This could be due to sepsis.  Follow PT - INR.   Discussed with  heme/onc who advised to discontinue subcutaneous heparin DVT prophylaxis, and to give vitamin K and plasma if he has active bleeding. Checking DIC panel.  Elevated LFT Patient with no history of liver failure. AST, ALT are within normal limits. Total bili is 2.3, alkaline phosphatase is 129.  Fractionate Bili.  Will check an acute hepatitis panel.    Hypokalemia and hypomagnesemia Secondary to poor by mouth intake, sepsis and renal failure. Will replete. And follow labs.  Chronic pain Patient has been on chronic pain medications for 12 years.  He is managed by Dr. [name] [facility]  Also takes high dose scheduled klonopin.  Have ordered chronic pain meds as he takes at home with holding parameters.  Will order klonopin PRN.   Consultants Called:  [name], Hand Surgeon.  Dr. Imogene Burn, Vascular surgery  Family Communication:   Wife and son at bedside.  Code Status:  Full  Condition:  Very guarded.  Could easily decompensate  Potential Disposition: To home when improved.  4-5 days?  Time spent in minutes : 60   Algis Downs,  PA-C on 04/07/2015 at 8:34 AM Between 7am to 7pm - Pager - [phone] After 7pm go to [email or web address] - password [email or web address] And look for the night coverage person covering me after hours  [facility]

## 2015-04-07 NOTE — ED Notes (Signed)
[facility] at facility to transport

## 2015-04-07 NOTE — Progress Notes (Signed)
Utilization review completed.  

## 2015-04-07 NOTE — ED Notes (Signed)
After 1L NS pt BP is over 100 systolic and HR decreased to 120's.  Pt taken to xray at this time

## 2015-04-07 NOTE — Progress Notes (Signed)
Patient injects testosterone 2cc q week on Wednesday.  Lab called for positive blood cultures - on appropriate antibiotic therapy.   Algis Downs, PA-C [facility] Pager: [phone]

## 2015-04-07 NOTE — Progress Notes (Signed)
   O: MRI Hand complete : distal radius fx, prob triquetral fx, with difuse prob hematoma/swelling    A:L hand swelling ,findings per MRI, doubt this is source of patients condition   P:splint L wrist, elevate, nwb, ice, pt may gently move fingers to assist with edema, prevent stiffness

## 2015-04-07 NOTE — Progress Notes (Signed)
*  PRELIMINARY RESULTS* Vascular Ultrasound Right Lower Extremity Arterial Duplex has been completed.   The right femoral artery appears to dilate from 0.96cm to 4.76cm in its proximal segment in the long axis, and in the transverse plane measures 6.3cm; this is in the area of the pulsatile palpable mass. This is suggestive of a possible aneurysm. Just distal to this area, the femoral artery appears to be occluded with internal echoes; suggestive of thrombus versus plaque versus unknown etiology. There is mid to distal thigh collateral flow and reconstitution of the femoral artery in the distal segment with severely dampened monophasic waveforms. There is monophasic flow through to the distal posterior tibial and dorsalis pedis arteries.  VASCULAR LAB PRELIMINARY  ARTERIAL  ABI completed:    RIGHT    LEFT    PRESSURE WAVEFORM  PRESSURE WAVEFORM  BRACHIAL 97 Triphasic BRACHIAL 114 Triphasic  DP 27 Severely dampened monophasic DP 119 Triphasic  AT   AT    PT 58 Dampened monophasic PT 113 Triphasic  PER   PER    GREAT TOE No discernable waveform NA GREAT TOE 52 NA    RIGHT LEFT  ABI 0.51 1.04   The right ABI is suggestive of moderate, borderline severe, arterial insufficiency. The left ABI is within normal limits. There is no discernable right great toe waveform, the left TBI is 0.46 which is abnormal.  04/07/2015 1:38 PM Gertie Fey, RVT, RDCS, RDMS

## 2015-04-07 NOTE — Progress Notes (Addendum)
   Daily Progress Note  Asked to briefly comment on patient's studies before formal consultation.  His arterial study are consistent with a chronically thrombosed right femoral aneurysm.  The chronicity is evident with a R ABI of 0.51 and intact motor in the right foot.  Patient baseline has peripheral neuropathy so his anesthesia is also likely chronic.  Patient is currently medically unstable with tachycardiac, coagulopathy, and intermittent hypotension from possible urosepsis.  Once he stabilizes and renal function recovers, further evaluation of the right leg with CTA abd/pelvis with runoff is recommended to evaluate the right femoral anatomy and screen for aortic, femoral, and popliteal aneurysms in both legs.  I suspect he will also need an angiogram to better determine the revascularization options in the right leg.  - Formal consult to follow tomorrow  Leonides Sake, MD [facility] Office: [phone] Pager: [phone]  04/07/2015, 7:59 PM

## 2015-04-07 NOTE — Progress Notes (Signed)
ANTIBIOTIC CONSULT NOTE - INITIAL  Pharmacy Consult for Vancomycin and Zosyn Indication: sepsis/cellulitis of hand  Allergies  Allergen Reactions  . Imitrex [Sumatriptan]     Chest pain, 2004  . Verapamil     Patient Measurements: Height:  (190.5 cm) Weight: 228 lb (103.42 kg) IBW/kg (Calculated) : 84.5 Adjusted Body Weight: 92.1 kg  Vital Signs: Temp: 98 F (36.7 C) (09/11 0758) Temp Source: Oral (09/11 0758) BP: 100/51 mmHg (09/11 0549) Pulse Rate: 130 (09/11 0549) Intake/Output from previous day: 09/10 0701 - 09/11 0700 In: 3000 [I.V.:3000] Out: 200 [Urine:200] Intake/Output from this shift:    Labs:  Recent Labs  04/07/15 0008 04/07/15 0737  WBC 31.3*  --   HGB 11.6*  --   PLT 255  --   CREATININE 4.47* 3.30*   Estimated Creatinine Clearance: 32.6 mL/min (by C-G formula based on Cr of 3.3). No results for input(s): VANCOTROUGH, VANCOPEAK, VANCORANDOM, GENTTROUGH, GENTPEAK, GENTRANDOM, TOBRATROUGH, TOBRAPEAK, TOBRARND, AMIKACINPEAK, AMIKACINTROU, AMIKACIN in the last 72 hours.   Microbiology: No results found for this or any previous visit (from the past 720 hour(s)).  Medical History: Past Medical History  Diagnosis Date  . Hypertension   . Chronic knee pain   . Hyperlipemia   . PUD (peptic ulcer disease)     Medications:  Prescriptions prior to admission  Medication Sig Dispense Refill Last Dose  . albuterol (PROAIR HFA) 108 (90 BASE) MCG/ACT inhaler Inhale 1 puff into the lungs every 6 (six) hours as needed for wheezing or shortness of breath.    over 30 days  . atenolol (TENORMIN) 100 MG tablet Take 100 mg by mouth daily.   02/25/2015 at 800  . atorvastatin (LIPITOR) 80 MG tablet Take 80 mg by mouth daily.   02/24/2015 at Unknown time  . clonazePAM (KLONOPIN) 2 MG tablet Take 2 mg by mouth 4 (four) times daily.   02/25/2015 at Unknown time  . colchicine 0.6 MG tablet Take 1 tablet (0.6 mg total) by mouth 2 (two) times daily. 14 tablet 0   .  Diclofenac-Misoprostol 75-0.2 MG TBEC Take 1 tablet by mouth 2 (two) times daily.  0 02/25/2015 at Unknown time  . famciclovir (FAMVIR) 500 MG tablet Take 500 mg by mouth 2 (two) times daily as needed (outbreak).    02/24/2015 at Unknown time  . ibuprofen (ADVIL,MOTRIN) 800 MG tablet Take 1 tablet (800 mg total) by mouth 3 (three) times daily. 21 tablet 0   . morphine (MS CONTIN) 30 MG 12 hr tablet Take 30 mg by mouth 2 (two) times daily.  0 02/25/2015 at Unknown time  . morphine (MS CONTIN) 60 MG 12 hr tablet Take 60 mg by mouth 2 (two) times daily.  0 02/25/2015 at Unknown time  . naloxegol oxalate (MOVANTIK) 25 MG TABS tablet Take 25 mg by mouth 2 (two) times daily.   02/25/2015 at Unknown time  . ondansetron (ZOFRAN) 8 MG tablet Take 8 mg by mouth daily as needed for nausea or vomiting.   0 over 30 days  . oxycodone (ROXICODONE) 30 MG immediate release tablet Take 30 mg by mouth 4 (four) times daily.    02/25/2015 at Unknown time  . oxyCODONE-acetaminophen (PERCOCET/ROXICET) 5-325 MG per tablet Take 1 tablet by mouth every 6 (six) hours as needed for severe pain. 10 tablet 0   . SUMAtriptan (IMITREX) 50 MG tablet Take 50 mg by mouth daily as needed for migraine or headache.   0 over 30 days  Assessment: 59 yo M seen in the ED for hand pain and swelling s/p fall a few days ago. Found to be tachychardic and hypotensive in the ED w/ LA 4.16 > 2.4. Pharmacy consulted to dose vancomycin and Zosyn for sepsis r/o / cellulitis.   1 g vancomycin given at 0228 on 9/11 Zosyn 3.375 g given at 0310 on 9/11  SCr 3.3, CrCl 33. HR 130, BP 100/51, afebrile. WBC 31  Goal of Therapy:  Vancomycin trough level 15-20 mcg/ml  Resolution of infection  Plan:  Vancomycin 1250 mg IV x1 to complete loading dose, then Vancomycin 1250 mg IV q24h Zosyn 3.375 g q8h Follow up culture results, renal fxn, clinical improvement, LOT Vancomycin trough at [facility]  Arcola Jansky, PharmD Clinical Pharmacy Resident Pager:  [phone] 04/07/2015,8:42 AM

## 2015-04-07 NOTE — Consult Note (Signed)
Reason for Consult:L hand swelling Referring Physician: Hospitalist  CC: My hand is swollen and hurts  HPI:  [name] is an 59 y.o. right handed male who presents with progressive swelling and pain of L Hand.  Pt fell on L side on Wed of last week, bracing himself with  L hand wrist, states L hand wrist finger have progressively become more painful and swollen, no unable to move wrist and fingers without pain    .   Pain is rated at   10 /10 and is described as sharp/dull.  Pain is constant.  Pain is made better by rest/immobilization, worse with motion.   Associated signs/symptoms: Previous treatment:    Past Medical History  Diagnosis Date  . Hypertension   . Chronic knee pain   . Hyperlipemia   . PUD (peptic ulcer disease)     Past Surgical History  Procedure Laterality Date  . I&d extremity Left 07/12/2013    Procedure: I&D Left Thigh Abscess;  Surgeon: Wylene Simmer, MD;  Location: [facility];  Service: Orthopedics;  Laterality: Left;  . Replacement total knee bilateral    . Tonsillectomy    . Hernia repair    . Cholecystectomy    . Appendectomy    . Shoulder surgery    . Elbow surgery      Family History  Problem Relation Age of Onset  . Heart attack Paternal Grandmother 47  . Heart disease Mother 81    arrythmia  . Stroke Paternal Grandfather   . Stroke Maternal Grandfather     Social History:  reports that he has quit smoking. His smoking use included Cigarettes. He does not have any smokeless tobacco history on file. He reports that he does not drink alcohol or use illicit drugs.  Allergies:  Allergies  Allergen Reactions  . Imitrex [Sumatriptan]     Chest pain, 2004  . Verapamil     Medications: I have reviewed the patient's current medications.  Results for orders placed or performed during the hospital encounter of 04/06/15 (from the past 48 hour(s))  CBC with Differential/Platelet     Status: Abnormal   Collection Time: 04/07/15 12:08 AM  Result Value  Ref Range   WBC 31.3 (H) 4.0 - 10.5 K/uL   RBC 4.41 4.22 - 5.81 MIL/uL   Hemoglobin 11.6 (L) 13.0 - 17.0 g/dL   HCT 34.0 (L) 39.0 - 52.0 %   MCV 77.1 (L) 78.0 - 100.0 fL   MCH 26.3 26.0 - 34.0 pg   MCHC 34.1 30.0 - 36.0 g/dL   RDW 16.3 (H) 11.5 - 15.5 %   Platelets 255 150 - 400 K/uL   Neutrophils Relative % 79 (H) 43 - 77 %   Lymphocytes Relative 4 (L) 12 - 46 %   Monocytes Relative 4 3 - 12 %   Eosinophils Relative 0 0 - 5 %   Basophils Relative 0 0 - 1 %   Band Neutrophils 11 (H) 0 - 10 %   Metamyelocytes Relative 1 %   Myelocytes 1 %   Promyelocytes Absolute 0 %   Blasts 0 %   nRBC 0 0 /100 WBC   Other 0 %   Neutro Abs 28.7 (H) 1.7 - 7.7 K/uL   Lymphs Abs 1.3 0.7 - 4.0 K/uL   Monocytes Absolute 1.3 (H) 0.1 - 1.0 K/uL   Eosinophils Absolute 0.0 0.0 - 0.7 K/uL   Basophils Absolute 0.0 0.0 - 0.1 K/uL   RBC Morphology  BURR CELLS    WBC Morphology TOXIC GRANULATION     Comment: VACUOLATED NEUTROPHILS  Comprehensive metabolic panel     Status: Abnormal   Collection Time: 04/07/15 12:08 AM  Result Value Ref Range   Sodium 132 (L) 135 - 145 mmol/L   Potassium 2.9 (L) 3.5 - 5.1 mmol/L   Chloride 98 (L) 101 - 111 mmol/L   CO2 17 (L) 22 - 32 mmol/L   Glucose, Bld 141 (H) 65 - 99 mg/dL   BUN 56 (H) 6 - 20 mg/dL   Creatinine, Ser 4.47 (H) 0.61 - 1.24 mg/dL   Calcium 8.3 (L) 8.9 - 10.3 mg/dL   Total Protein 6.8 6.5 - 8.1 g/dL   Albumin 2.4 (L) 3.5 - 5.0 g/dL   AST 34 15 - 41 U/L   ALT 21 17 - 63 U/L   Alkaline Phosphatase 129 (H) 38 - 126 U/L   Total Bilirubin 2.3 (H) 0.3 - 1.2 mg/dL   GFR calc non Af Amer 13 (L) >60 mL/min   GFR calc Af Amer 16 (L) >60 mL/min    Comment: (NOTE) The eGFR has been calculated using the CKD EPI equation. This calculation has not been validated in all clinical situations. eGFR's persistently <60 mL/min signify possible Chronic Kidney Disease.    Anion gap 17 (H) 5 - 15  Troponin I     Status: None   Collection Time: 04/07/15 12:08 AM   Result Value Ref Range   Troponin I <0.03 <0.031 ng/mL    Comment:        NO INDICATION OF MYOCARDIAL INJURY.   Acetaminophen level     Status: Abnormal   Collection Time: 04/07/15 12:08 AM  Result Value Ref Range   Acetaminophen (Tylenol), Serum <10 (L) 10 - 30 ug/mL    Comment:        THERAPEUTIC CONCENTRATIONS VARY SIGNIFICANTLY. A RANGE OF 10-30 ug/mL MAY BE AN EFFECTIVE CONCENTRATION FOR MANY PATIENTS. HOWEVER, SOME ARE BEST TREATED AT CONCENTRATIONS OUTSIDE THIS RANGE. ACETAMINOPHEN CONCENTRATIONS >150 ug/mL AT 4 HOURS AFTER INGESTION AND >50 ug/mL AT 12 HOURS AFTER INGESTION ARE OFTEN ASSOCIATED WITH TOXIC REACTIONS.   Salicylate level     Status: None   Collection Time: 04/07/15 12:08 AM  Result Value Ref Range   Salicylate Lvl <7.7 2.8 - 30.0 mg/dL    Comment: REPEATED TO VERIFY  I-Stat CG4 Lactic Acid, ED     Status: Abnormal   Collection Time: 04/07/15 12:16 AM  Result Value Ref Range   Lactic Acid, Venous 4.16 (HH) 0.5 - 2.0 mmol/L   Comment NOTIFIED PHYSICIAN   Potassium     Status: Abnormal   Collection Time: 04/07/15  1:13 AM  Result Value Ref Range   Potassium 2.7 (LL) 3.5 - 5.1 mmol/L    Comment: CRITICAL RESULT CALLED TO, READ BACK BY AND VERIFIED WITH: MAYNARD,C AT 0140 ON [id number] BY CHERESNOWSKY,T   Magnesium     Status: Abnormal   Collection Time: 04/07/15  1:13 AM  Result Value Ref Range   Magnesium 1.3 (L) 1.7 - 2.4 mg/dL  CK     Status: Abnormal   Collection Time: 04/07/15  1:13 AM  Result Value Ref Range   Total CK 495 (H) 49 - 397 U/L  Urinalysis, Routine w reflex microscopic (not at [facility])     Status: Abnormal   Collection Time: 04/07/15  1:47 AM  Result Value Ref Range   Color, Urine ORANGE (A) YELLOW  Comment: BIOCHEMICALS MAY BE AFFECTED BY COLOR   APPearance TURBID (A) CLEAR   Specific Gravity, Urine 1.019 1.005 - 1.030   pH 5.0 5.0 - 8.0   Glucose, UA NEGATIVE NEGATIVE mg/dL   Hgb urine dipstick MODERATE (A) NEGATIVE    Bilirubin Urine MODERATE (A) NEGATIVE   Ketones, ur 15 (A) NEGATIVE mg/dL   Protein, ur 100 (A) NEGATIVE mg/dL   Urobilinogen, UA 1.0 0.0 - 1.0 mg/dL   Nitrite POSITIVE (A) NEGATIVE   Leukocytes, UA SMALL (A) NEGATIVE  Urine rapid drug screen (hosp performed)     Status: Abnormal   Collection Time: 04/07/15  1:47 AM  Result Value Ref Range   Opiates POSITIVE (A) NONE DETECTED   Cocaine NONE DETECTED NONE DETECTED   Benzodiazepines POSITIVE (A) NONE DETECTED   Amphetamines NONE DETECTED NONE DETECTED   Tetrahydrocannabinol NONE DETECTED NONE DETECTED   Barbiturates NONE DETECTED NONE DETECTED    Comment:        DRUG SCREEN FOR MEDICAL PURPOSES ONLY.  IF CONFIRMATION IS NEEDED FOR ANY PURPOSE, NOTIFY LAB WITHIN 5 DAYS.        LOWEST DETECTABLE LIMITS FOR URINE DRUG SCREEN Drug Class       Cutoff (ng/mL) Amphetamine      1000 Barbiturate      200 Benzodiazepine   546 Tricyclics       270 Opiates          300 Cocaine          300 THC              50   Urine microscopic-add on     Status: Abnormal   Collection Time: 04/07/15  1:47 AM  Result Value Ref Range   Squamous Epithelial / LPF RARE RARE   WBC, UA 3-6 <3 WBC/hpf   RBC / HPF 0-2 <3 RBC/hpf   Bacteria, UA MANY (A) RARE   Casts GRANULAR CAST (A) NEGATIVE    Comment: HYALINE CASTS   Crystals CA OXALATE CRYSTALS (A) NEGATIVE  I-Stat CG4 Lactic Acid, ED     Status: Abnormal   Collection Time: 04/07/15  2:18 AM  Result Value Ref Range   Lactic Acid, Venous 2.45 (HH) 0.5 - 2.0 mmol/L   Comment NOTIFIED PHYSICIAN   MRSA PCR Screening     Status: None   Collection Time: 04/07/15  6:30 AM  Result Value Ref Range   MRSA by PCR NEGATIVE NEGATIVE    Comment:        The GeneXpert MRSA Assay (FDA approved for NASAL specimens only), is one component of a comprehensive MRSA colonization surveillance program. It is not intended to diagnose MRSA infection nor to guide or monitor treatment for MRSA infections.   Basic  metabolic panel     Status: Abnormal   Collection Time: 04/07/15  7:37 AM  Result Value Ref Range   Sodium 132 (L) 135 - 145 mmol/L   Potassium 3.4 (L) 3.5 - 5.1 mmol/L   Chloride 105 101 - 111 mmol/L   CO2 17 (L) 22 - 32 mmol/L   Glucose, Bld 109 (H) 65 - 99 mg/dL   BUN 48 (H) 6 - 20 mg/dL   Creatinine, Ser 3.30 (H) 0.61 - 1.24 mg/dL   Calcium 7.6 (L) 8.9 - 10.3 mg/dL   GFR calc non Af Amer 19 (L) >60 mL/min   GFR calc Af Amer 23 (L) >60 mL/min    Comment: (NOTE) The eGFR has been calculated using  the CKD EPI equation. This calculation has not been validated in all clinical situations. eGFR's persistently <60 mL/min signify possible Chronic Kidney Disease.    Anion gap 10 5 - 15  Lactic acid, plasma     Status: Abnormal   Collection Time: 04/07/15  7:37 AM  Result Value Ref Range   Lactic Acid, Venous 2.4 (HH) 0.5 - 2.0 mmol/L    Comment: CRITICAL RESULT CALLED TO, READ BACK BY AND VERIFIED WITH: E.SANDFEILD,RN 04/07/15 @0834  BY V.WILKINS   Protime-INR     Status: Abnormal   Collection Time: 04/07/15  7:37 AM  Result Value Ref Range   Prothrombin Time 24.7 (H) 11.6 - 15.2 seconds   INR 2.26 (H) 0.00 - 1.49  APTT     Status: Abnormal   Collection Time: 04/07/15  7:37 AM  Result Value Ref Range   aPTT 39 (H) 24 - 37 seconds    Comment:        IF BASELINE aPTT IS ELEVATED, SUGGEST PATIENT RISK ASSESSMENT BE USED TO DETERMINE APPROPRIATE ANTICOAGULANT THERAPY.     Dg Chest 2 View  04/07/2015   CLINICAL DATA:  58 year old male with recent fall and shortness of breath.  EXAM: CHEST  2 VIEW  COMPARISON:  Radiograph dated 03/01/2015  FINDINGS: The heart size and mediastinal contours are within normal limits. Both lungs are clear. The visualized skeletal structures are unremarkable.  IMPRESSION: No active cardiopulmonary disease.   Electronically Signed   By: Anner Crete M.D.   On: 04/07/2015 02:18   Dg Elbow Complete Left  04/07/2015   CLINICAL DATA:  59 year old male  with trauma and pain in the left upper extremity.  EXAM: LEFT SHOULDER - 2+ VIEW; LEFT FOREARM - 2 VIEW; LEFT ELBOW - COMPLETE 3+ VIEW; LEFT HUMERUS - 2+ VIEW; LEFT WRIST - COMPLETE 3+ VIEW; LEFT HAND - COMPLETE 3+ VIEW  COMPARISON:  None.  FINDINGS: There is no evidence of fracture or dislocation. There is no evidence of arthropathy or other focal bone abnormality. Soft tissues are unremarkable.  There is diffuse soft tissue swelling of the distal forearm and hand. No radiopaque foreign object identified. Clinical correlation is recommended.  IMPRESSION: No acute fracture or dislocation.  Soft tissue swelling of the distal forearm and hand.   Electronically Signed   By: Anner Crete M.D.   On: 04/07/2015 02:23   Dg Forearm Left  04/07/2015   CLINICAL DATA:  59 year old male with trauma and pain in the left upper extremity.  EXAM: LEFT SHOULDER - 2+ VIEW; LEFT FOREARM - 2 VIEW; LEFT ELBOW - COMPLETE 3+ VIEW; LEFT HUMERUS - 2+ VIEW; LEFT WRIST - COMPLETE 3+ VIEW; LEFT HAND - COMPLETE 3+ VIEW  COMPARISON:  None.  FINDINGS: There is no evidence of fracture or dislocation. There is no evidence of arthropathy or other focal bone abnormality. Soft tissues are unremarkable.  There is diffuse soft tissue swelling of the distal forearm and hand. No radiopaque foreign object identified. Clinical correlation is recommended.  IMPRESSION: No acute fracture or dislocation.  Soft tissue swelling of the distal forearm and hand.   Electronically Signed   By: Anner Crete M.D.   On: 04/07/2015 02:23   Dg Wrist Complete Left  04/07/2015   CLINICAL DATA:  59 year old male with trauma and pain in the left upper extremity.  EXAM: LEFT SHOULDER - 2+ VIEW; LEFT FOREARM - 2 VIEW; LEFT ELBOW - COMPLETE 3+ VIEW; LEFT HUMERUS - 2+ VIEW; LEFT WRIST - COMPLETE 3+  VIEW; LEFT HAND - COMPLETE 3+ VIEW  COMPARISON:  None.  FINDINGS: There is no evidence of fracture or dislocation. There is no evidence of arthropathy or other focal bone  abnormality. Soft tissues are unremarkable.  There is diffuse soft tissue swelling of the distal forearm and hand. No radiopaque foreign object identified. Clinical correlation is recommended.  IMPRESSION: No acute fracture or dislocation.  Soft tissue swelling of the distal forearm and hand.   Electronically Signed   By: Anner Crete M.D.   On: 04/07/2015 02:23   Dg Shoulder Left  04/07/2015   CLINICAL DATA:  59 year old male with trauma and pain in the left upper extremity.  EXAM: LEFT SHOULDER - 2+ VIEW; LEFT FOREARM - 2 VIEW; LEFT ELBOW - COMPLETE 3+ VIEW; LEFT HUMERUS - 2+ VIEW; LEFT WRIST - COMPLETE 3+ VIEW; LEFT HAND - COMPLETE 3+ VIEW  COMPARISON:  None.  FINDINGS: There is no evidence of fracture or dislocation. There is no evidence of arthropathy or other focal bone abnormality. Soft tissues are unremarkable.  There is diffuse soft tissue swelling of the distal forearm and hand. No radiopaque foreign object identified. Clinical correlation is recommended.  IMPRESSION: No acute fracture or dislocation.  Soft tissue swelling of the distal forearm and hand.   Electronically Signed   By: Anner Crete M.D.   On: 04/07/2015 02:23   Dg Humerus Left  04/07/2015   CLINICAL DATA:  59 year old male with trauma and pain in the left upper extremity.  EXAM: LEFT SHOULDER - 2+ VIEW; LEFT FOREARM - 2 VIEW; LEFT ELBOW - COMPLETE 3+ VIEW; LEFT HUMERUS - 2+ VIEW; LEFT WRIST - COMPLETE 3+ VIEW; LEFT HAND - COMPLETE 3+ VIEW  COMPARISON:  None.  FINDINGS: There is no evidence of fracture or dislocation. There is no evidence of arthropathy or other focal bone abnormality. Soft tissues are unremarkable.  There is diffuse soft tissue swelling of the distal forearm and hand. No radiopaque foreign object identified. Clinical correlation is recommended.  IMPRESSION: No acute fracture or dislocation.  Soft tissue swelling of the distal forearm and hand.   Electronically Signed   By: Anner Crete M.D.   On: 04/07/2015  02:23   Dg Hand Complete Left  04/07/2015   CLINICAL DATA:  59 year old male with trauma and pain in the left upper extremity.  EXAM: LEFT SHOULDER - 2+ VIEW; LEFT FOREARM - 2 VIEW; LEFT ELBOW - COMPLETE 3+ VIEW; LEFT HUMERUS - 2+ VIEW; LEFT WRIST - COMPLETE 3+ VIEW; LEFT HAND - COMPLETE 3+ VIEW  COMPARISON:  None.  FINDINGS: There is no evidence of fracture or dislocation. There is no evidence of arthropathy or other focal bone abnormality. Soft tissues are unremarkable.  There is diffuse soft tissue swelling of the distal forearm and hand. No radiopaque foreign object identified. Clinical correlation is recommended.  IMPRESSION: No acute fracture or dislocation.  Soft tissue swelling of the distal forearm and hand.   Electronically Signed   By: Anner Crete M.D.   On: 04/07/2015 02:23    Pertinent items are noted in HPI. Temp:  [98 F (36.7 C)-98.7 F (37.1 C)] 98 F (36.7 C) (09/11 0758) Pulse Rate:  [122-152] 128 (09/11 0800) Resp:  [14-28] 14 (09/11 0800) BP: (80-119)/(47-68) 103/54 mmHg (09/11 0800) SpO2:  [98 %-100 %] 99 % (09/11 0800) Weight:  [103.42 kg (228 lb)] 103.42 kg (228 lb) (09/10 2343) General appearance: alert and cooperative Resp: clear to auscultation bilaterally Cardio: regular rate and rhythm GI:  soft, non-tender; bowel sounds normal; no masses,  no organomegaly Extremities: RUE wnl in appearance , function; LUE: Hand diffusely swollen, no real erythema, but diffusely swollen from distal forearm to finger tips, unalbe to make full fist b/c pain and swelling, no abrasion or puncture wound L foot with some swelling, decreased cap refill to toes, some purplish lesions   Assessment: LUE swelling - ? Traumatic due to fall although no fracture, dislocation appreciated, ? Infections although ? source Plan: Would obtain MRI to look for deep abscess. I have discussed this treatment plan in detail with patient and family, including the risks of the recommended treatment or  surgery, the benefits and the alternatives.  The patient and caregiver understands that additional treatment may be necessary.  Enma Maeda CHRISTOPHER 04/07/2015, 9:18 AM

## 2015-04-08 ENCOUNTER — Encounter (HOSPITAL_COMMUNITY): Payer: Self-pay

## 2015-04-08 ENCOUNTER — Inpatient Hospital Stay (HOSPITAL_COMMUNITY): Payer: Medicaid Other

## 2015-04-08 DIAGNOSIS — Z5181 Encounter for therapeutic drug level monitoring: Secondary | ICD-10-CM

## 2015-04-08 DIAGNOSIS — N179 Acute kidney failure, unspecified: Secondary | ICD-10-CM

## 2015-04-08 DIAGNOSIS — A419 Sepsis, unspecified organism: Secondary | ICD-10-CM

## 2015-04-08 DIAGNOSIS — I82501 Chronic embolism and thrombosis of unspecified deep veins of right lower extremity: Secondary | ICD-10-CM

## 2015-04-08 DIAGNOSIS — I724 Aneurysm of artery of lower extremity: Secondary | ICD-10-CM

## 2015-04-08 DIAGNOSIS — L03114 Cellulitis of left upper limb: Secondary | ICD-10-CM

## 2015-04-08 DIAGNOSIS — I743 Embolism and thrombosis of arteries of the lower extremities: Secondary | ICD-10-CM

## 2015-04-08 DIAGNOSIS — W19XXXA Unspecified fall, initial encounter: Secondary | ICD-10-CM

## 2015-04-08 DIAGNOSIS — S52502A Unspecified fracture of the lower end of left radius, initial encounter for closed fracture: Secondary | ICD-10-CM

## 2015-04-08 LAB — URINE CULTURE: Culture: NO GROWTH

## 2015-04-08 LAB — HEPATIC FUNCTION PANEL
ALK PHOS: 81 U/L (ref 38–126)
ALT: 21 U/L (ref 17–63)
AST: 24 U/L (ref 15–41)
Albumin: 1.6 g/dL — ABNORMAL LOW (ref 3.5–5.0)
BILIRUBIN DIRECT: 0.6 mg/dL — AB (ref 0.1–0.5)
Indirect Bilirubin: 1.2 mg/dL — ABNORMAL HIGH (ref 0.3–0.9)
Total Bilirubin: 1.8 mg/dL — ABNORMAL HIGH (ref 0.3–1.2)
Total Protein: 5.2 g/dL — ABNORMAL LOW (ref 6.5–8.1)

## 2015-04-08 LAB — BASIC METABOLIC PANEL
Anion gap: 11 (ref 5–15)
BUN: 52 mg/dL — AB (ref 6–20)
CHLORIDE: 107 mmol/L (ref 101–111)
CO2: 16 mmol/L — AB (ref 22–32)
Calcium: 7.6 mg/dL — ABNORMAL LOW (ref 8.9–10.3)
Creatinine, Ser: 2.91 mg/dL — ABNORMAL HIGH (ref 0.61–1.24)
GFR calc Af Amer: 26 mL/min — ABNORMAL LOW (ref >60)
GFR calc non Af Amer: 23 mL/min — ABNORMAL LOW (ref >60)
GLUCOSE: 108 mg/dL — AB (ref 65–99)
POTASSIUM: 4.3 mmol/L (ref 3.5–5.1)
SODIUM: 134 mmol/L — AB (ref 135–145)

## 2015-04-08 LAB — IRON AND TIBC
Iron: 9 ug/dL — ABNORMAL LOW (ref 45–182)
SATURATION RATIOS: 8 % — AB (ref 17.9–39.5)
TIBC: 118 ug/dL — ABNORMAL LOW (ref 250–450)
UIBC: 109 ug/dL

## 2015-04-08 LAB — FERRITIN: Ferritin: 465 ng/mL — ABNORMAL HIGH (ref 24–336)

## 2015-04-08 LAB — CBC
HEMATOCRIT: 27.4 % — AB (ref 39.0–52.0)
Hemoglobin: 9.4 g/dL — ABNORMAL LOW (ref 13.0–17.0)
MCH: 26.9 pg (ref 26.0–34.0)
MCHC: 34.3 g/dL (ref 30.0–36.0)
MCV: 78.3 fL (ref 78.0–100.0)
Platelets: 249 10*3/uL (ref 150–400)
RBC: 3.5 MIL/uL — ABNORMAL LOW (ref 4.22–5.81)
RDW: 16.9 % — AB (ref 11.5–15.5)
WBC: 22.6 10*3/uL — AB (ref 4.0–10.5)

## 2015-04-08 LAB — PROTIME-INR
INR: 1.61 — AB (ref 0.00–1.49)
Prothrombin Time: 19.1 seconds — ABNORMAL HIGH (ref 11.6–15.2)

## 2015-04-08 LAB — VITAMIN B12: VITAMIN B 12: 473 pg/mL (ref 180–914)

## 2015-04-08 LAB — RETICULOCYTES
RBC.: 3.36 MIL/uL — AB (ref 4.22–5.81)
RETIC COUNT ABSOLUTE: 26.9 10*3/uL (ref 19.0–186.0)
RETIC CT PCT: 0.8 % (ref 0.4–3.1)

## 2015-04-08 LAB — MAGNESIUM: Magnesium: 2 mg/dL (ref 1.7–2.4)

## 2015-04-08 LAB — HIV ANTIBODY (ROUTINE TESTING W REFLEX): HIV Screen 4th Generation wRfx: NONREACTIVE

## 2015-04-08 LAB — HEPATITIS PANEL, ACUTE
HEP B S AG: NEGATIVE
Hep A IgM: NEGATIVE
Hep B C IgM: NEGATIVE

## 2015-04-08 LAB — LACTIC ACID, PLASMA
LACTIC ACID, VENOUS: 1.6 mmol/L (ref 0.5–2.0)
Lactic Acid, Venous: 2 mmol/L (ref 0.5–2.0)

## 2015-04-08 LAB — FOLATE: Folate: 15.2 ng/mL (ref >5.9)

## 2015-04-08 MED ORDER — SODIUM CHLORIDE 0.9 % IV BOLUS (SEPSIS)
1000.0000 mL | Freq: Once | INTRAVENOUS | Status: AC
  Administered 2015-04-08: 1000 mL via INTRAVENOUS

## 2015-04-08 MED ORDER — SODIUM CHLORIDE 0.9 % IV BOLUS (SEPSIS)
500.0000 mL | Freq: Once | INTRAVENOUS | Status: AC
  Administered 2015-04-08: 500 mL via INTRAVENOUS

## 2015-04-08 MED ORDER — CLONAZEPAM 1 MG PO TABS
1.0000 mg | ORAL_TABLET | Freq: Three times a day (TID) | ORAL | Status: DC | PRN
  Administered 2015-04-09 – 2015-04-10 (×2): 1 mg via ORAL
  Filled 2015-04-08 (×2): qty 1

## 2015-04-08 MED ORDER — HEPARIN SODIUM (PORCINE) 5000 UNIT/ML IJ SOLN
5000.0000 [IU] | Freq: Three times a day (TID) | INTRAMUSCULAR | Status: DC
  Administered 2015-04-08 (×2): 5000 [IU] via SUBCUTANEOUS
  Filled 2015-04-08 (×2): qty 1

## 2015-04-08 MED ORDER — VENLAFAXINE HCL ER 37.5 MG PO CP24
37.5000 mg | ORAL_CAPSULE | Freq: Every day | ORAL | Status: DC
  Administered 2015-04-09 – 2015-04-20 (×10): 37.5 mg via ORAL
  Filled 2015-04-08 (×13): qty 1

## 2015-04-08 MED ORDER — ENSURE ENLIVE PO LIQD
237.0000 mL | Freq: Two times a day (BID) | ORAL | Status: DC
  Administered 2015-04-08 – 2015-04-16 (×13): 237 mL via ORAL

## 2015-04-08 MED ORDER — HYDROMORPHONE HCL 2 MG PO TABS
2.0000 mg | ORAL_TABLET | ORAL | Status: DC | PRN
  Administered 2015-04-09 (×2): 2 mg via ORAL
  Filled 2015-04-08 (×2): qty 1

## 2015-04-08 MED ORDER — OXYCODONE-ACETAMINOPHEN 5-325 MG PO TABS
1.0000 | ORAL_TABLET | Freq: Four times a day (QID) | ORAL | Status: DC | PRN
  Administered 2015-04-09 – 2015-04-27 (×34): 1 via ORAL
  Filled 2015-04-08 (×35): qty 1

## 2015-04-08 MED ORDER — TRIFLURIDINE 1 % OP SOLN
1.0000 [drp] | Freq: Two times a day (BID) | OPHTHALMIC | Status: DC | PRN
  Filled 2015-04-08: qty 7.5

## 2015-04-08 MED ORDER — ALBUTEROL SULFATE (2.5 MG/3ML) 0.083% IN NEBU: 3.0000 mL | INHALATION_SOLUTION | RESPIRATORY_TRACT | Status: DC | PRN

## 2015-04-08 NOTE — Progress Notes (Signed)
Pt c/o feeling anxious.  Ativan given per prn order.

## 2015-04-08 NOTE — Consult Note (Addendum)
[facility]  Date of Admission:  04/06/2015  Date of Consult:  04/08/2015  Reason for Consult: Staph bacteremia Referring Physician: CHAMP  Impression/Recommendation Sepsis Staph bacteremia  Would check TEE  Await sensi on bcx  Recheck BCx  Consider stopping zosyn soon  Wrist Fracture  Appreciate ortho f/u  RLE aneurysm and thrombus  Would query if this is due to his staph?  Acute Renal failure  Watch with hydration, improving  Therapeutic Drug monitoring  F/u vanco level  Follow Cr.    Thank you so much for this interesting consult,   [name] (pager) [phone] [email or web address]  [name] is an 59 y.o. male.  HPI: 59 yo M with hx of MRSA abscesses, with a hx of fall 9-7 onto his left side. comes to ED on 9-11 with worsening swelling of his L hand and wrist, unable to move. In Ed he was noted to have hypotension, tachycardia and WBC of 31.3. He was also noted to have pyuria. He was started on vanco/zosyn.  He was noted to have LUE cellulitis as well as a cold dusky R foot.  He was found on MRI to have radius fracture as well as possible triquetral fracture and hematoma.  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  His BCx returned as positive in the first 24h. Now found to have Staph aureus bacteremia  Past Medical History  Diagnosis Date  . Hypertension   . Chronic knee pain   . Hyperlipemia   . PUD (peptic ulcer disease)     Past Surgical History  Procedure Laterality Date  . I&d extremity Left 07/12/2013    Procedure: I&D Left Thigh Abscess;  Surgeon: [name], MD;  Location: [facility] OR;  Service: Orthopedics;  Laterality: Left;  . Replacement total knee bilateral    . Tonsillectomy    . Hernia repair    . Cholecystectomy    . Appendectomy    . Shoulder surgery    . Elbow surgery       Allergies  Allergen Reactions  . Imitrex [Sumatriptan] Other  (See Comments)    Chest pain, 2004 (tolerates 50 mg prn)  . Verapamil Other (See Comments)    Causes pvc's    Medications:  Scheduled: . docusate sodium  100 mg Oral BID  . feeding supplement (ENSURE ENLIVE)  237 mL Oral BID BM  . heparin subcutaneous  5,000 Units Subcutaneous 3 times per day  . morphine  90 mg Oral 2 times per day  . naloxegol oxalate  25 mg Oral Daily  . oxyCODONE  30 mg Oral 2 times per day  . piperacillin-tazobactam (ZOSYN)  IV  3.375 g Intravenous 3 times per day  . sodium chloride  3 mL Intravenous Q12H  . Tdap  0.5 mL Intramuscular Once  . vancomycin  1,250 mg Intravenous Q24H    Abtx:  Anti-infectives    Start     Dose/Rate Route Frequency Ordered Stop   04/08/15 0900  vancomycin (VANCOCIN) 1,250 mg in sodium chloride 0.9 % 250 mL IVPB     1,250 mg 166.7 mL/hr over 90 Minutes Intravenous Every 24 hours 04/07/15 0835     04/07/15 1100  piperacillin-tazobactam (ZOSYN) IVPB 3.375 g     3.375 g 12.5 mL/hr over 240 Minutes Intravenous 3 times per day 04/07/15 0834     04/07/15 0845  vancomycin (VANCOCIN) 1,250 mg in sodium chloride  0.9 % 250 mL IVPB     1,250 mg 166.7 mL/hr over 90 Minutes Intravenous  Once 04/07/15 0834 04/07/15 1101   04/07/15 0730  piperacillin-tazobactam (ZOSYN) IVPB 3.375 g  Status:  Discontinued     3.375 g 100 mL/hr over 30 Minutes Intravenous  Once 04/07/15 0723 04/07/15 0727   04/07/15 0730  vancomycin (VANCOCIN) IVPB 1000 mg/200 mL premix  Status:  Discontinued     1,000 mg 200 mL/hr over 60 Minutes Intravenous  Once 04/07/15 0723 04/07/15 0727   04/07/15 0724  famciclovir (FAMVIR) tablet 500 mg     500 mg Oral 2 times daily PRN 04/07/15 0729     04/07/15 0215  vancomycin (VANCOCIN) IVPB 1000 mg/200 mL premix     1,000 mg 200 mL/hr over 60 Minutes Intravenous  Once 04/07/15 0212 04/07/15 0311   04/07/15 0215  piperacillin-tazobactam (ZOSYN) IVPB 3.375 g     3.375 g 100 mL/hr over 30 Minutes Intravenous  Once 04/07/15 0212  04/07/15 0503      Total days of antibiotics: 1 vanco/zosyn          Social History:  reports that he has quit smoking. His smoking use included Cigarettes. He does not have any smokeless tobacco history on file. He reports that he does not drink alcohol or use illicit drugs.  Family History  Problem Relation Age of Onset  . Heart attack Paternal Grandmother 30  . Heart disease Mother 63    arrythmia  . Stroke Paternal Grandfather   . Stroke Maternal Grandfather     General ROS: no change in wt, nl apetite, normal BM, urine dark on adm. no f/c at home.  Please see HPI. 12 point ROS o/w (-)  Blood pressure 99/53, pulse 111, temperature 98.8 F (37.1 C), temperature source Oral, resp. rate 14, height $RemoveBe'6\' 3"'LRsLhVvcU$  (1.905 m), weight 103.42 kg (228 lb), SpO2 97 %. General appearance: alert, cooperative and no distress Eyes: negative findings: conjunctivae and sclerae normal and pupils equal, round, reactive to light and accomodation Throat: lips, mucosa, and tongue normal; teeth and gums normal and dry Neck: no adenopathy and supple, symmetrical, trachea midline Lungs: clear to auscultation bilaterally Heart: tachycardia Abdomen: normal findings: bowel sounds normal and soft, non-tender Extremities: LUE is in brace. there is edema. RLE has blistering, areas of bluish discoloration, swelling, cool.   neuro- decreased sensation BLE. Mild.    Results for orders placed or performed during the hospital encounter of 04/06/15 (from the past 48 hour(s))  Culture, blood (routine x 2)     Status: None (Preliminary result)   Collection Time: 04/06/15 11:50 PM  Result Value Ref Range   Specimen Description BLOOD RIGHT HAND    Special Requests      BOTTLES DRAWN AEROBIC AND ANAEROBIC AER 10cc ANA 10cc   Culture  Setup Time      GRAM POSITIVE COCCI IN CLUSTERS IN BOTH AEROBIC AND ANAEROBIC BOTTLES CORRECTED RESULTS CALLED TO: W DAVIS,RN AT 1151 04/08/15 BY L BENFIELD PREVIOUSLY REPORTED AS GRAM  NEGATIVE COCCI AND GRAM VARIABLE COCCI    Culture      STAPHYLOCOCCUS AUREUS Performed at [facility]    Report Status PENDING   Culture, blood (routine x 2)     Status: None (Preliminary result)   Collection Time: 04/07/15 12:05 AM  Result Value Ref Range   Specimen Description BLOOD RIGHT FOREARM    Special Requests      BOTTLES DRAWN AEROBIC AND ANAEROBIC AER 5cc  ANA 5cc   Culture  Setup Time      GRAM POSITIVE COCCI IN CLUSTERS IN BOTH AEROBIC AND ANAEROBIC BOTTLES CORRECTED RESULTS CALLED TO: W DAVIS,RN AT 1151 04/08/15 BY L BENFIELD PREVIOUSLY REPORTED AS GRAM NEGATIVE COCCI AND GRAM VARIABLE COCCI    Culture      STAPHYLOCOCCUS AUREUS Performed at [facility]    Report Status PENDING   CBC with Differential/Platelet     Status: Abnormal   Collection Time: 04/07/15 12:08 AM  Result Value Ref Range   WBC 31.3 (H) 4.0 - 10.5 K/uL   RBC 4.41 4.22 - 5.81 MIL/uL   Hemoglobin 11.6 (L) 13.0 - 17.0 g/dL   HCT 34.0 (L) 39.0 - 52.0 %   MCV 77.1 (L) 78.0 - 100.0 fL   MCH 26.3 26.0 - 34.0 pg   MCHC 34.1 30.0 - 36.0 g/dL   RDW 16.3 (H) 11.5 - 15.5 %   Platelets 255 150 - 400 K/uL   Neutrophils Relative % 79 (H) 43 - 77 %   Lymphocytes Relative 4 (L) 12 - 46 %   Monocytes Relative 4 3 - 12 %   Eosinophils Relative 0 0 - 5 %   Basophils Relative 0 0 - 1 %   Band Neutrophils 11 (H) 0 - 10 %   Metamyelocytes Relative 1 %   Myelocytes 1 %   Promyelocytes Absolute 0 %   Blasts 0 %   nRBC 0 0 /100 WBC   Other 0 %   Neutro Abs 28.7 (H) 1.7 - 7.7 K/uL   Lymphs Abs 1.3 0.7 - 4.0 K/uL   Monocytes Absolute 1.3 (H) 0.1 - 1.0 K/uL   Eosinophils Absolute 0.0 0.0 - 0.7 K/uL   Basophils Absolute 0.0 0.0 - 0.1 K/uL   RBC Morphology BURR CELLS    WBC Morphology TOXIC GRANULATION     Comment: VACUOLATED NEUTROPHILS  Comprehensive metabolic panel     Status: Abnormal   Collection Time: 04/07/15 12:08 AM  Result Value Ref Range   Sodium 132 (L) 135 - 145 mmol/L    Potassium 2.9 (L) 3.5 - 5.1 mmol/L   Chloride 98 (L) 101 - 111 mmol/L   CO2 17 (L) 22 - 32 mmol/L   Glucose, Bld 141 (H) 65 - 99 mg/dL   BUN 56 (H) 6 - 20 mg/dL   Creatinine, Ser 4.47 (H) 0.61 - 1.24 mg/dL   Calcium 8.3 (L) 8.9 - 10.3 mg/dL   Total Protein 6.8 6.5 - 8.1 g/dL   Albumin 2.4 (L) 3.5 - 5.0 g/dL   AST 34 15 - 41 U/L   ALT 21 17 - 63 U/L   Alkaline Phosphatase 129 (H) 38 - 126 U/L   Total Bilirubin 2.3 (H) 0.3 - 1.2 mg/dL   GFR calc non Af Amer 13 (L) >60 mL/min   GFR calc Af Amer 16 (L) >60 mL/min    Comment: (NOTE) The eGFR has been calculated using the CKD EPI equation. This calculation has not been validated in all clinical situations. eGFR's persistently <60 mL/min signify possible Chronic Kidney Disease.    Anion gap 17 (H) 5 - 15  Troponin I     Status: None   Collection Time: 04/07/15 12:08 AM  Result Value Ref Range   Troponin I <0.03 <0.031 ng/mL    Comment:        NO INDICATION OF MYOCARDIAL INJURY.   Acetaminophen level     Status: Abnormal   Collection  Time: 04/07/15 12:08 AM  Result Value Ref Range   Acetaminophen (Tylenol), Serum <10 (L) 10 - 30 ug/mL    Comment:        THERAPEUTIC CONCENTRATIONS VARY SIGNIFICANTLY. A RANGE OF 10-30 ug/mL MAY BE AN EFFECTIVE CONCENTRATION FOR MANY PATIENTS. HOWEVER, SOME ARE BEST TREATED AT CONCENTRATIONS OUTSIDE THIS RANGE. ACETAMINOPHEN CONCENTRATIONS >150 ug/mL AT 4 HOURS AFTER INGESTION AND >50 ug/mL AT 12 HOURS AFTER INGESTION ARE OFTEN ASSOCIATED WITH TOXIC REACTIONS.   Salicylate level     Status: None   Collection Time: 04/07/15 12:08 AM  Result Value Ref Range   Salicylate Lvl <7.7 2.8 - 30.0 mg/dL    Comment: REPEATED TO VERIFY  I-Stat CG4 Lactic Acid, ED     Status: Abnormal   Collection Time: 04/07/15 12:16 AM  Result Value Ref Range   Lactic Acid, Venous 4.16 (HH) 0.5 - 2.0 mmol/L   Comment NOTIFIED PHYSICIAN   Potassium     Status: Abnormal   Collection Time: 04/07/15  1:13 AM    Result Value Ref Range   Potassium 2.7 (LL) 3.5 - 5.1 mmol/L    Comment: CRITICAL RESULT CALLED TO, READ BACK BY AND VERIFIED WITH: MAYNARD,C AT 0140 ON [id number] BY CHERESNOWSKY,T   Magnesium     Status: Abnormal   Collection Time: 04/07/15  1:13 AM  Result Value Ref Range   Magnesium 1.3 (L) 1.7 - 2.4 mg/dL  CK     Status: Abnormal   Collection Time: 04/07/15  1:13 AM  Result Value Ref Range   Total CK 495 (H) 49 - 397 U/L  Urinalysis, Routine w reflex microscopic (not at [facility])     Status: Abnormal   Collection Time: 04/07/15  1:47 AM  Result Value Ref Range   Color, Urine ORANGE (A) YELLOW    Comment: BIOCHEMICALS MAY BE AFFECTED BY COLOR   APPearance TURBID (A) CLEAR   Specific Gravity, Urine 1.019 1.005 - 1.030   pH 5.0 5.0 - 8.0   Glucose, UA NEGATIVE NEGATIVE mg/dL   Hgb urine dipstick MODERATE (A) NEGATIVE   Bilirubin Urine MODERATE (A) NEGATIVE   Ketones, ur 15 (A) NEGATIVE mg/dL   Protein, ur 100 (A) NEGATIVE mg/dL   Urobilinogen, UA 1.0 0.0 - 1.0 mg/dL   Nitrite POSITIVE (A) NEGATIVE   Leukocytes, UA SMALL (A) NEGATIVE  Urine rapid drug screen (hosp performed)     Status: Abnormal   Collection Time: 04/07/15  1:47 AM  Result Value Ref Range   Opiates POSITIVE (A) NONE DETECTED   Cocaine NONE DETECTED NONE DETECTED   Benzodiazepines POSITIVE (A) NONE DETECTED   Amphetamines NONE DETECTED NONE DETECTED   Tetrahydrocannabinol NONE DETECTED NONE DETECTED   Barbiturates NONE DETECTED NONE DETECTED    Comment:        DRUG SCREEN FOR MEDICAL PURPOSES ONLY.  IF CONFIRMATION IS NEEDED FOR ANY PURPOSE, NOTIFY LAB WITHIN 5 DAYS.        LOWEST DETECTABLE LIMITS FOR URINE DRUG SCREEN Drug Class       Cutoff (ng/mL) Amphetamine      1000 Barbiturate      200 Benzodiazepine   092 Tricyclics       330 Opiates          300 Cocaine          300 THC              50   Urine microscopic-add on     Status: Abnormal  Collection Time: 04/07/15  1:47 AM  Result Value Ref  Range   Squamous Epithelial / LPF RARE RARE   WBC, UA 3-6 <3 WBC/hpf   RBC / HPF 0-2 <3 RBC/hpf   Bacteria, UA MANY (A) RARE   Casts GRANULAR CAST (A) NEGATIVE    Comment: HYALINE CASTS   Crystals CA OXALATE CRYSTALS (A) NEGATIVE  I-Stat CG4 Lactic Acid, ED     Status: Abnormal   Collection Time: 04/07/15  2:18 AM  Result Value Ref Range   Lactic Acid, Venous 2.45 (HH) 0.5 - 2.0 mmol/L   Comment NOTIFIED PHYSICIAN   MRSA PCR Screening     Status: None   Collection Time: 04/07/15  6:30 AM  Result Value Ref Range   MRSA by PCR NEGATIVE NEGATIVE    Comment:        The GeneXpert MRSA Assay (FDA approved for NASAL specimens only), is one component of a comprehensive MRSA colonization surveillance program. It is not intended to diagnose MRSA infection nor to guide or monitor treatment for MRSA infections.   Basic metabolic panel     Status: Abnormal   Collection Time: 04/07/15  7:37 AM  Result Value Ref Range   Sodium 132 (L) 135 - 145 mmol/L   Potassium 3.4 (L) 3.5 - 5.1 mmol/L   Chloride 105 101 - 111 mmol/L   CO2 17 (L) 22 - 32 mmol/L   Glucose, Bld 109 (H) 65 - 99 mg/dL   BUN 48 (H) 6 - 20 mg/dL   Creatinine, Ser 3.30 (H) 0.61 - 1.24 mg/dL   Calcium 7.6 (L) 8.9 - 10.3 mg/dL   GFR calc non Af Amer 19 (L) >60 mL/min   GFR calc Af Amer 23 (L) >60 mL/min    Comment: (NOTE) The eGFR has been calculated using the CKD EPI equation. This calculation has not been validated in all clinical situations. eGFR's persistently <60 mL/min signify possible Chronic Kidney Disease.    Anion gap 10 5 - 15  Lactic acid, plasma     Status: Abnormal   Collection Time: 04/07/15  7:37 AM  Result Value Ref Range   Lactic Acid, Venous 2.4 (HH) 0.5 - 2.0 mmol/L    Comment: CRITICAL RESULT CALLED TO, READ BACK BY AND VERIFIED WITH: E.SANDFEILD,RN 04/07/15 @0834  BY V.WILKINS   Procalcitonin     Status: None   Collection Time: 04/07/15  7:37 AM  Result Value Ref Range   Procalcitonin 9.72  ng/mL    Comment:        Interpretation: PCT > 2 ng/mL: Systemic infection (sepsis) is likely, unless other causes are known. (NOTE)         ICU PCT Algorithm               Non ICU PCT Algorithm    ----------------------------     ------------------------------         PCT < 0.25 ng/mL                 PCT < 0.1 ng/mL     Stopping of antibiotics            Stopping of antibiotics       strongly encouraged.               strongly encouraged.    ----------------------------     ------------------------------       PCT level decrease by  PCT < 0.25 ng/mL       >= 80% from peak PCT       OR PCT 0.25 - 0.5 ng/mL          Stopping of antibiotics                                             encouraged.     Stopping of antibiotics           encouraged.    ----------------------------     ------------------------------       PCT level decrease by              PCT >= 0.25 ng/mL       < 80% from peak PCT        AND PCT >= 0.5 ng/mL            Continuing antibiotics                                               encouraged.       Continuing antibiotics            encouraged.    ----------------------------     ------------------------------     PCT level increase compared          PCT > 0.5 ng/mL         with peak PCT AND          PCT >= 0.5 ng/mL             Escalation of antibiotics                                          strongly encouraged.      Escalation of antibiotics        strongly encouraged.   Protime-INR     Status: Abnormal   Collection Time: 04/07/15  7:37 AM  Result Value Ref Range   Prothrombin Time 24.7 (H) 11.6 - 15.2 seconds   INR 2.26 (H) 0.00 - 1.49  APTT     Status: Abnormal   Collection Time: 04/07/15  7:37 AM  Result Value Ref Range   aPTT 39 (H) 24 - 37 seconds    Comment:        IF BASELINE aPTT IS ELEVATED, SUGGEST PATIENT RISK ASSESSMENT BE USED TO DETERMINE APPROPRIATE ANTICOAGULANT THERAPY.   Urine culture     Status: None   Collection  Time: 04/07/15  7:55 AM  Result Value Ref Range   Specimen Description URINE, RANDOM    Special Requests NONE    Culture NO GROWTH 1 DAY    Report Status 04/08/2015 FINAL   Creatinine, urine, random     Status: None   Collection Time: 04/07/15  7:55 AM  Result Value Ref Range   Creatinine, Urine 123.18 mg/dL  Sodium, urine, random     Status: None   Collection Time: 04/07/15  7:55 AM  Result Value Ref Range   Sodium, Ur <10 mmol/L  Lactic acid, plasma     Status: Abnormal   Collection Time: 04/07/15  9:50 AM  Result Value Ref  Range   Lactic Acid, Venous 2.8 (HH) 0.5 - 2.0 mmol/L    Comment: CRITICAL RESULT CALLED TO, READ BACK BY AND VERIFIED WITH: E.STANFIELD,RN 04/07/15 @ 1050 BY V.WILKINS   DIC (disseminated intravasc coag) panel     Status: Abnormal   Collection Time: 04/07/15  4:10 PM  Result Value Ref Range   Prothrombin Time 22.0 (H) 11.6 - 15.2 seconds   INR 1.93 (H) 0.00 - 1.49   aPTT 37 24 - 37 seconds    Comment:        IF BASELINE aPTT IS ELEVATED, SUGGEST PATIENT RISK ASSESSMENT BE USED TO DETERMINE APPROPRIATE ANTICOAGULANT THERAPY.    Fibrinogen >800 (H) 204 - 475 mg/dL   D-Dimer, Quant 4.71 (H) 0.00 - 0.48 ug/mL-FEU    Comment:        AT THE INHOUSE ESTABLISHED CUTOFF VALUE OF 0.48 ug/mL FEU, THIS ASSAY HAS BEEN DOCUMENTED IN THE LITERATURE TO HAVE A SENSITIVITY AND NEGATIVE PREDICTIVE VALUE OF AT LEAST 98 TO 99%.  THE TEST RESULT SHOULD BE CORRELATED WITH AN ASSESSMENT OF THE CLINICAL PROBABILITY OF DVT / VTE.    Platelets 230 150 - 400 K/uL   Smear Review NO SCHISTOCYTES SEEN   Hepatitis panel, acute     Status: None   Collection Time: 04/07/15  4:10 PM  Result Value Ref Range   Hepatitis B Surface Ag Negative Negative   HCV Ab <0.1 0.0 - 0.9 s/co ratio    Comment: (NOTE)                                  Negative:     < 0.8                             Indeterminate: 0.8 - 0.9                                  Positive:     > 0.9 The CDC  recommends that a positive HCV antibody result be followed up with a HCV Nucleic Acid Amplification test (416606). Performed At: [facility] [place], [place] [id number] Lindon Romp MD AT:[id number]    Hep A IgM Negative Negative   Hep B C IgM Negative Negative  Bilirubin, fractionated(tot/dir/indir)     Status: Abnormal   Collection Time: 04/07/15  4:10 PM  Result Value Ref Range   Total Bilirubin 2.0 (H) 0.3 - 1.2 mg/dL   Bilirubin, Direct 1.1 (H) 0.1 - 0.5 mg/dL   Indirect Bilirubin 0.9 0.3 - 0.9 mg/dL  HIV antibody     Status: None   Collection Time: 04/07/15  6:46 PM  Result Value Ref Range   HIV Screen 4th Generation wRfx Non Reactive Non Reactive    Comment: (NOTE) Performed At: [facility] [place], [place] [id number] Lindon Romp MD [id number]   Basic metabolic panel     Status: Abnormal   Collection Time: 04/08/15  3:19 AM  Result Value Ref Range   Sodium 134 (L) 135 - 145 mmol/L   Potassium 4.3 3.5 - 5.1 mmol/L    Comment: DELTA CHECK NOTED   Chloride 107 101 - 111 mmol/L   CO2 16 (L) 22 - 32 mmol/L   Glucose, Bld 108 (H) 65 - 99 mg/dL  BUN 52 (H) 6 - 20 mg/dL   Creatinine, Ser 2.91 (H) 0.61 - 1.24 mg/dL   Calcium 7.6 (L) 8.9 - 10.3 mg/dL   GFR calc non Af Amer 23 (L) >60 mL/min   GFR calc Af Amer 26 (L) >60 mL/min    Comment: (NOTE) The eGFR has been calculated using the CKD EPI equation. This calculation has not been validated in all clinical situations. eGFR's persistently <60 mL/min signify possible Chronic Kidney Disease.    Anion gap 11 5 - 15  CBC     Status: Abnormal   Collection Time: 04/08/15  3:19 AM  Result Value Ref Range   WBC 22.6 (H) 4.0 - 10.5 K/uL    Comment: REPEATED TO VERIFY WHITE COUNT CONFIRMED ON SMEAR    RBC 3.50 (L) 4.22 - 5.81 MIL/uL   Hemoglobin 9.4 (L) 13.0 - 17.0 g/dL   HCT 27.4 (L) 39.0 - 52.0 %   MCV 78.3 78.0 - 100.0 fL   MCH 26.9 26.0 - 34.0 pg   MCHC 34.3  30.0 - 36.0 g/dL   RDW 16.9 (H) 11.5 - 15.5 %   Platelets 249 150 - 400 K/uL    Comment: SPECIMEN CHECKED FOR CLOTS REPEATED TO VERIFY PLATELET COUNT CONFIRMED BY SMEAR   Magnesium     Status: None   Collection Time: 04/08/15  3:19 AM  Result Value Ref Range   Magnesium 2.0 1.7 - 2.4 mg/dL  Protime-INR     Status: Abnormal   Collection Time: 04/08/15  3:19 AM  Result Value Ref Range   Prothrombin Time 19.1 (H) 11.6 - 15.2 seconds   INR 1.61 (H) 0.00 - 1.49  Hepatic function panel     Status: Abnormal   Collection Time: 04/08/15  3:19 AM  Result Value Ref Range   Total Protein 5.2 (L) 6.5 - 8.1 g/dL   Albumin 1.6 (L) 3.5 - 5.0 g/dL   AST 24 15 - 41 U/L   ALT 21 17 - 63 U/L   Alkaline Phosphatase 81 38 - 126 U/L   Total Bilirubin 1.8 (H) 0.3 - 1.2 mg/dL   Bilirubin, Direct 0.6 (H) 0.1 - 0.5 mg/dL   Indirect Bilirubin 1.2 (H) 0.3 - 0.9 mg/dL  Vitamin B12     Status: None   Collection Time: 04/08/15  8:48 AM  Result Value Ref Range   Vitamin B-12 473 180 - 914 pg/mL    Comment: (NOTE) This assay is not validated for testing neonatal or myeloproliferative syndrome specimens for Vitamin B12 levels.   Folate     Status: None   Collection Time: 04/08/15  8:48 AM  Result Value Ref Range   Folate 15.2 >5.9 ng/mL  Iron and TIBC     Status: Abnormal   Collection Time: 04/08/15  8:48 AM  Result Value Ref Range   Iron 9 (L) 45 - 182 ug/dL   TIBC 118 (L) 250 - 450 ug/dL   Saturation Ratios 8 (L) 17.9 - 39.5 %   UIBC 109 ug/dL  Ferritin     Status: Abnormal   Collection Time: 04/08/15  8:48 AM  Result Value Ref Range   Ferritin 465 (H) 24 - 336 ng/mL  Reticulocytes     Status: Abnormal   Collection Time: 04/08/15  8:48 AM  Result Value Ref Range   Retic Ct Pct 0.8 0.4 - 3.1 %   RBC. 3.36 (L) 4.22 - 5.81 MIL/uL   Retic Count, Manual 26.9 19.0 - 186.0 K/uL  Component Value Date/Time   SDES URINE, RANDOM 04/07/2015 0755   SPECREQUEST NONE 04/07/2015 0755   CULT NO GROWTH  1 DAY 04/07/2015 0755   REPTSTATUS 04/08/2015 FINAL 04/07/2015 0755   Dg Chest 2 View  04/07/2015   CLINICAL DATA:  59 year old male with recent fall and shortness of breath.  EXAM: CHEST  2 VIEW  COMPARISON:  Radiograph dated 03/01/2015  FINDINGS: The heart size and mediastinal contours are within normal limits. Both lungs are clear. The visualized skeletal structures are unremarkable.  IMPRESSION: No active cardiopulmonary disease.   Electronically Signed   By: Anner Crete M.D.   On: 04/07/2015 02:18   Dg Elbow Complete Left  04/07/2015   CLINICAL DATA:  59 year old male with trauma and pain in the left upper extremity.  EXAM: LEFT SHOULDER - 2+ VIEW; LEFT FOREARM - 2 VIEW; LEFT ELBOW - COMPLETE 3+ VIEW; LEFT HUMERUS - 2+ VIEW; LEFT WRIST - COMPLETE 3+ VIEW; LEFT HAND - COMPLETE 3+ VIEW  COMPARISON:  None.  FINDINGS: There is no evidence of fracture or dislocation. There is no evidence of arthropathy or other focal bone abnormality. Soft tissues are unremarkable.  There is diffuse soft tissue swelling of the distal forearm and hand. No radiopaque foreign object identified. Clinical correlation is recommended.  IMPRESSION: No acute fracture or dislocation.  Soft tissue swelling of the distal forearm and hand.   Electronically Signed   By: Anner Crete M.D.   On: 04/07/2015 02:23   Dg Forearm Left  04/07/2015   CLINICAL DATA:  59 year old male with trauma and pain in the left upper extremity.  EXAM: LEFT SHOULDER - 2+ VIEW; LEFT FOREARM - 2 VIEW; LEFT ELBOW - COMPLETE 3+ VIEW; LEFT HUMERUS - 2+ VIEW; LEFT WRIST - COMPLETE 3+ VIEW; LEFT HAND - COMPLETE 3+ VIEW  COMPARISON:  None.  FINDINGS: There is no evidence of fracture or dislocation. There is no evidence of arthropathy or other focal bone abnormality. Soft tissues are unremarkable.  There is diffuse soft tissue swelling of the distal forearm and hand. No radiopaque foreign object identified. Clinical correlation is recommended.  IMPRESSION: No  acute fracture or dislocation.  Soft tissue swelling of the distal forearm and hand.   Electronically Signed   By: Anner Crete M.D.   On: 04/07/2015 02:23   Dg Wrist Complete Left  04/07/2015   CLINICAL DATA:  59 year old male with trauma and pain in the left upper extremity.  EXAM: LEFT SHOULDER - 2+ VIEW; LEFT FOREARM - 2 VIEW; LEFT ELBOW - COMPLETE 3+ VIEW; LEFT HUMERUS - 2+ VIEW; LEFT WRIST - COMPLETE 3+ VIEW; LEFT HAND - COMPLETE 3+ VIEW  COMPARISON:  None.  FINDINGS: There is no evidence of fracture or dislocation. There is no evidence of arthropathy or other focal bone abnormality. Soft tissues are unremarkable.  There is diffuse soft tissue swelling of the distal forearm and hand. No radiopaque foreign object identified. Clinical correlation is recommended.  IMPRESSION: No acute fracture or dislocation.  Soft tissue swelling of the distal forearm and hand.   Electronically Signed   By: Anner Crete M.D.   On: 04/07/2015 02:23   Mr Hand Left Wo Contrast  04/07/2015   CLINICAL DATA:  Cellulitis of the LEFT hand. LEFT hand pain and swelling. Fall last Wednesday.  EXAM: MRI OF THE LEFT HAND WITHOUT CONTRAST  TECHNIQUE: Multiplanar, multisequence MR imaging was performed. No intravenous contrast was administered.  COMPARISON:  None.  FINDINGS: There is diffuse edema in  the LEFT hand. This is more prominent over the dorsal subcutaneous tissues of the hand however there are fluid collections in the deep spaces of the hand. These demonstrate heterogeneous T2 signal on the axial images. In the absence of contrast, these are difficult to further evaluate the the heterogeneous material in the setting of recent trauma is most compatible with hematoma. These extend into the intrinsic muscles of the hand, particularly along the volar aspect of the third, fourth and fifth metacarpals.  There is a dorsal wrist fluid collection compatible with hematoma. The scaphoid bone appears intact. There is no  osteomyelitis.  Although this study was not optimized for the wrist, there appears to be dorsal band tear of the scapholunate ligament (image 14 series 13). Lunotriquetral ligament grossly appears intact. Scapholunate ligament is poorly evaluated.  At the more proximal margin of the scan on the T1 weighted imaging, there appears to be a fracture of the distal radius (image 28 series 14). Scattered areas of bone marrow edema are present throughout the carpal bones compatible with contusions. Correlating with prior plain films, there appear to be a small avulsion fracture off the dorsal aspect of the carpal bones on the lateral projection.  IMPRESSION: 1. Dorsal distal radius transverse fracture, partially visible. 2. Diffuse edema of the hand, likely reactive to distal radius fracture. 3. Tear of the dorsal band of the scapholunate ligament. 4. Multiple small deep fluid collections in the hands are most compatible with hematoma given the heterogeneous T2 signal. In this patient with an elevated white blood cell count, superimposed infection is possible but this would be rare and in the setting of wrist trauma, ordinary hematoma is most likely. 5. Consider a noncontrast CT the wrist for further assessment of the distal radius fracture.   Electronically Signed   By: Dereck Ligas M.D.   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 Other active problems: Sepsis, UTI, ARF coagulopathy, left radial fracture.  EXAM: CT OF THE RIGHT FEMUR WITHOUT CONTRAST  TECHNIQUE: Multidetector CT imaging was performed according to the standard protocol. Multiplanar CT image reconstructions were also generated.  COMPARISON:  None.  FINDINGS: 7.4 cm nearly isoechoic mass deep to the sartorius in the proximal thigh, along the course of the proximal SFA, which is not identified separately at the level  of the process. Prominent right inguinal adenopathy up to 13 mm short axis diameter. Changes of knee hemiarthroplasty. Degenerative spurring in the lateral compartment of the knee. Small effusion in the suprapatellar bursa. No fracture or other acute bone abnormality.  IMPRESSION: 1. 7.4 cm process involving or adjacent to the proximal right SFA. Considerations include aneurysm, pseudoaneurysm, hematoma, mass.   Electronically Signed   By: Lucrezia Europe M.D.   On: 04/08/2015 12:54   Dg Shoulder Left  04/07/2015   CLINICAL DATA:  59 year old male with trauma and pain in the left upper extremity.  EXAM: LEFT SHOULDER - 2+ VIEW; LEFT FOREARM - 2 VIEW; LEFT ELBOW - COMPLETE 3+ VIEW; LEFT HUMERUS - 2+ VIEW; LEFT WRIST - COMPLETE 3+ VIEW; LEFT HAND - COMPLETE 3+ VIEW  COMPARISON:  None.  FINDINGS: There is no evidence of fracture or dislocation. There is no evidence of arthropathy or other focal bone abnormality. Soft tissues are unremarkable.  There is diffuse soft tissue swelling of the distal forearm and hand. No radiopaque foreign  object identified. Clinical correlation is recommended.  IMPRESSION: No acute fracture or dislocation.  Soft tissue swelling of the distal forearm and hand.   Electronically Signed   By: Anner Crete M.D.   On: 04/07/2015 02:23   Dg Humerus Left  04/07/2015   CLINICAL DATA:  59 year old male with trauma and pain in the left upper extremity.  EXAM: LEFT SHOULDER - 2+ VIEW; LEFT FOREARM - 2 VIEW; LEFT ELBOW - COMPLETE 3+ VIEW; LEFT HUMERUS - 2+ VIEW; LEFT WRIST - COMPLETE 3+ VIEW; LEFT HAND - COMPLETE 3+ VIEW  COMPARISON:  None.  FINDINGS: There is no evidence of fracture or dislocation. There is no evidence of arthropathy or other focal bone abnormality. Soft tissues are unremarkable.  There is diffuse soft tissue swelling of the distal forearm and hand. No radiopaque foreign object identified. Clinical correlation is recommended.  IMPRESSION: No acute fracture or dislocation.  Soft  tissue swelling of the distal forearm and hand.   Electronically Signed   By: Anner Crete M.D.   On: 04/07/2015 02:23   Dg Hand Complete Left  04/07/2015   CLINICAL DATA:  59 year old male with trauma and pain in the left upper extremity.  EXAM: LEFT SHOULDER - 2+ VIEW; LEFT FOREARM - 2 VIEW; LEFT ELBOW - COMPLETE 3+ VIEW; LEFT HUMERUS - 2+ VIEW; LEFT WRIST - COMPLETE 3+ VIEW; LEFT HAND - COMPLETE 3+ VIEW  COMPARISON:  None.  FINDINGS: There is no evidence of fracture or dislocation. There is no evidence of arthropathy or other focal bone abnormality. Soft tissues are unremarkable.  There is diffuse soft tissue swelling of the distal forearm and hand. No radiopaque foreign object identified. Clinical correlation is recommended.  IMPRESSION: No acute fracture or dislocation.  Soft tissue swelling of the distal forearm and hand.   Electronically Signed   By: Anner Crete M.D.   On: 04/07/2015 02:23   Recent Results (from the past 240 hour(s))  Culture, blood (routine x 2)     Status: None (Preliminary result)   Collection Time: 04/06/15 11:50 PM  Result Value Ref Range Status   Specimen Description BLOOD RIGHT HAND  Final   Special Requests   Final    BOTTLES DRAWN AEROBIC AND ANAEROBIC AER 10cc ANA 10cc   Culture  Setup Time   Final    GRAM POSITIVE COCCI IN CLUSTERS IN BOTH AEROBIC AND ANAEROBIC BOTTLES CORRECTED RESULTS CALLED TO: W DAVIS,RN AT 1151 04/08/15 BY L BENFIELD PREVIOUSLY REPORTED AS GRAM NEGATIVE COCCI AND GRAM VARIABLE COCCI    Culture   Final    STAPHYLOCOCCUS AUREUS Performed at [facility]    Report Status PENDING  Incomplete  Culture, blood (routine x 2)     Status: None (Preliminary result)   Collection Time: 04/07/15 12:05 AM  Result Value Ref Range Status   Specimen Description BLOOD RIGHT FOREARM  Final   Special Requests   Final    BOTTLES DRAWN AEROBIC AND ANAEROBIC AER 5cc ANA 5cc   Culture  Setup Time   Final    GRAM POSITIVE COCCI IN  CLUSTERS IN BOTH AEROBIC AND ANAEROBIC BOTTLES CORRECTED RESULTS CALLED TO: W DAVIS,RN AT 1151 04/08/15 BY L BENFIELD PREVIOUSLY REPORTED AS GRAM NEGATIVE COCCI AND GRAM VARIABLE COCCI    Culture   Final    STAPHYLOCOCCUS AUREUS Performed at [facility]    Report Status PENDING  Incomplete  MRSA PCR Screening     Status: None   Collection Time: 04/07/15  6:30 AM  Result Value Ref Range Status   MRSA by PCR NEGATIVE NEGATIVE Final    Comment:        The GeneXpert MRSA Assay (FDA approved for NASAL specimens only), is one component of a comprehensive MRSA colonization surveillance program. It is not intended to diagnose MRSA infection nor to guide or monitor treatment for MRSA infections.   Urine culture     Status: None   Collection Time: 04/07/15  7:55 AM  Result Value Ref Range Status   Specimen Description URINE, RANDOM  Final   Special Requests NONE  Final   Culture NO GROWTH 1 DAY  Final   Report Status 04/08/2015 FINAL  Final      04/08/2015, 4:56 PM     LOS: 1 day    Records and images were personally reviewed where available.

## 2015-04-08 NOTE — Consult Note (Addendum)
Vascular and Vein [facility] Consult  Reason for Consult:  Right femoral aneurysm Referring Physician:  [facility] MRN #:  [id number]  History of Present Illness: This is a 59 y.o. male who presented to the [facility] ED on 04/07/15 with complaints of left hand pain after sustaining a fall on 04/03/15. The patient believes he fell secondary to numbness and loss of feeling of his right foot likely. 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 On 04/03/15, he reports "being in a hurry" and injected himself while on the commode in his right thigh. He felt that he had stuck himself too high, but proceeded to finish the injection. He sticks himself in different spots every week and has admitted to using used needles in the past. He did use a clean needle for this injection. The patient denied any bleeding episodes after the injection. However once his started having paraesthesias of his right foot later that day, he felt that he a "blood clot" in his right leg. He started taking xarelto secondary to this belief and continued for 4 days prior to presenting to [facility]. He was formerly on xarelto s/p left knee revision. The xarelto was discontinued in May secondary to "bleeding into the knee."   Also at time of admission, the patient reported difficulty with voiding stating he had only voided twice in the previous 4 days.   He takes diclofenac twice daily for orthopedic pain in addition to other NSAIDs. He reports no known renal insuffiency. He does have a history of "kidney infections" but no nephrolithiasis. He does have past medical history of PUD, hyperlipidemia, hypertension and chronic knee pain (s/p bilateral knee replacements).   He reports feeling warm, but no chills. He denies dysuria. He denies any chest pain, chest discomfort or shortness of breath.   Past Medical History  Diagnosis Date  . Hypertension   . Chronic knee pain   .  Hyperlipemia   . PUD (peptic ulcer disease)    Past Surgical History  Procedure Laterality Date  . I&d extremity Left 07/12/2013    Procedure: I&D Left Thigh Abscess;  Surgeon: [name], MD;  Location: [facility] OR;  Service: Orthopedics;  Laterality: Left;  . Replacement total knee bilateral    . Tonsillectomy    . Hernia repair    . Cholecystectomy    . Appendectomy    . Shoulder surgery    . Elbow surgery      Allergies  Allergen Reactions  . Imitrex [Sumatriptan] Other (See Comments)    Chest pain, 2004 (tolerates 50 mg prn)  . Verapamil Other (See Comments)    Causes pvc's    Prior to Admission medications   Medication Sig Start Date End Date Taking? Authorizing Provider  albuterol (PROAIR HFA) 108 (90 BASE) MCG/ACT inhaler Inhale 1 puff into the lungs every 6 (six) hours as needed for wheezing or shortness of breath.  09/06/13  Yes Historical Provider, MD  atenolol (TENORMIN) 100 MG tablet Take 100 mg by mouth daily.   Yes Historical Provider, MD  atorvastatin (LIPITOR) 80 MG tablet Take 80 mg by mouth daily.   Yes Historical Provider, MD  clonazePAM (KLONOPIN) 2 MG tablet Take 2 mg by mouth 4 (four) times daily.   Yes Historical Provider, MD  Diclofenac-Misoprostol 75-0.2 MG TBEC Take 1 tablet by mouth 2 (two) times daily. Arthrotec 02/21/15  Yes Historical Provider, MD  famciclovir (FAMVIR) 500 MG tablet Take 500 mg by  mouth 2 (two) times daily as needed (fever blisters).  10/25/13  Yes Historical Provider, MD  ibuprofen (ADVIL,MOTRIN) 800 MG tablet Take 1 tablet (800 mg total) by mouth 3 (three) times daily. Patient taking differently: Take 800 mg by mouth 2 (two) times daily as needed (pain).  02/25/15  Yes Ames Dura, MD  morphine (MS CONTIN) 30 MG 12 hr tablet Take 30 mg by mouth every 12 (twelve) hours. Take with a 60 mg tablet for a 90 mg dose 02/11/15  Yes Historical Provider, MD  morphine (MS CONTIN) 60 MG 12 hr tablet Take 60 mg by mouth every 12 (twelve) hours. Take with  a 30 mg tablet for a 90 mg dose 02/11/15  Yes Historical Provider, MD  naloxegol oxalate (MOVANTIK) 25 MG TABS tablet Take 25 mg by mouth 2 (two) times daily. 09/18/14  Yes Historical Provider, MD  ondansetron (ZOFRAN) 8 MG tablet Take 8 mg by mouth daily as needed for nausea or vomiting.  12/25/14  Yes Historical Provider, MD  OVER THE COUNTER MEDICATION Place 1 drop into both eyes 2 (two) times daily as needed (dry eyes). OTC lubricant eye drops   Yes Historical Provider, MD  oxycodone (ROXICODONE) 30 MG immediate release tablet Take 30 mg by mouth every 6 (six) hours. scheduled   Yes Historical Provider, MD  SUMAtriptan (IMITREX) 50 MG tablet Take 50 mg by mouth daily as needed for migraine or headache. Maximum 2 doses in 2 days 02/15/15  Yes Historical Provider, MD  testosterone cypionate (DEPOTESTOSTERONE CYPIONATE) 200 MG/ML injection Inject 400 mg into the muscle once a week. On Wednesday 03/04/15  Yes Historical Provider, MD  trifluridine (VIROPTIC) 1 % ophthalmic solution Place 1 drop into both eyes 2 (two) times daily as needed (breakouts (blurry, matted eyelids)).   Yes Historical Provider, MD  venlafaxine XR (EFFEXOR-XR) 37.5 MG 24 hr capsule Take 37.5 mg by mouth daily with breakfast.   Yes Historical Provider, MD  colchicine 0.6 MG tablet Take 1 tablet (0.6 mg total) by mouth 2 (two) times daily. Patient not taking: Reported on 04/07/2015 02/25/15   Ames Dura, MD  oxyCODONE-acetaminophen (PERCOCET/ROXICET) 5-325 MG per tablet Take 1 tablet by mouth every 6 (six) hours as needed for severe pain. Patient not taking: Reported on 04/07/2015 02/25/15   Ames Dura, MD    Social History   Social History  . Marital Status: Married    Spouse Name: N/A  . Number of Children: 3  . Years of Education: N/A   Occupational History  .      Sales   Social History Main Topics  . Smoking status: Former Smoker    Types: Cigarettes  . Smokeless tobacco: Not on file  . Alcohol Use: No  . Drug  Use: No  . Sexual Activity: Not on file   Other Topics Concern  . Not on file   Social History Narrative   Family History  Problem Relation Age of Onset  . Heart attack Paternal Grandmother 59  . Heart disease Mother 32    arrythmia  . Stroke Paternal Grandfather   . Stroke Maternal Grandfather     ROS:  Positive    Negative    All sytems reviewed and are negative  Cardiovascular:  chest pain/pressure  palpitations  SOB lying flat  DOE  pain in legs while walking  pain in legs at rest  pain in legs at night  non-healing ulcers  hx of DVT  swelling in legs  Pulmonary: []  productive cough []  asthma/wheezing []  home O2  Neurologic: [x]  weakness in []  arms [x]  legs x[]  numbness in []  arms [x]  legs []  hx of CVA []  mini stroke [] difficulty speaking or slurred speech []  temporary loss of vision in one eye []  dizziness  Hematologic: []  hx of cancer []  bleeding problems []  problems with blood clotting easily  Endocrine:   []  diabetes []  thyroid disease  GI []  vomiting blood []  blood in stool  GU: []  CKD/renal failure []  HD--[]  M/W/F or []  T/T/S []  burning with urination []  blood in urine  Psychiatric: []  anxiety []  depression  Musculoskeletal: []  arthritis [x]  joint pain  Integumentary: []  rashes []  ulcers  Constitutional: [x]  fever []  chills   Physical Examination  Filed Vitals:   04/08/15 0700  BP: 96/58  Pulse: 119  Temp:   Resp: 14   Body mass index is 28.5 kg/(m^2).  General:  WDWN in NAD Gait: Not observed HENT: WNL, normocephalic Eyes: Pupils equal Pulmonary: normal non-labored breathing, without Rales, rhonchi,  wheezing Cardiac: tachycardic without  murmurs, rubs or gallops; without carotid bruits Abdomen: soft, NT/ND, no masses Vascular Exam/Pulses:  Right Left  Femoral Pulsatile femoral mass 2+ (normal)  Popliteal Non palpable nonpalpable  DP monophasic 2+   PT monophasic triphasic    Extremities: right groin with erythema, induration and pulsatile mass, bluish discoloration to several toes. Bilateral lower extremity swelling, right worse than left. Feet feel warm bilaterally.  Musculoskeletal: no muscle wasting or atrophy  Neurologic: A&O X 3; Appropriate Affect ; SENSATION: normal; MOTOR FUNCTION:  moving all extremities equally. Speech is fluent/normal Psychiatric: Judgment intact, Mood & affect appropriate for pt's clinical situation Lymph : No Cervical or Axillary; mild R Inguinal lymphadenopathy   Laboratory CBC    Component Value Date/Time   WBC 22.6* 04/08/2015 0319   RBC 3.50* 04/08/2015 0319   HGB 9.4* 04/08/2015 0319   HCT 27.4* 04/08/2015 0319   PLT 249 04/08/2015 0319   MCV 78.3 04/08/2015 0319   MCH 26.9 04/08/2015 0319   MCHC 34.3 04/08/2015 0319   RDW 16.9* 04/08/2015 0319   LYMPHSABS 1.3 04/07/2015 0008   MONOABS 1.3* 04/07/2015 0008   EOSABS 0.0 04/07/2015 0008   BASOSABS 0.0 04/07/2015 0008    BMET    Component Value Date/Time   NA 134* 04/08/2015 0319   K 4.3 04/08/2015 0319   CL 107 04/08/2015 0319   CO2 16* 04/08/2015 0319   GLUCOSE 108* 04/08/2015 0319   BUN 52* 04/08/2015 0319   CREATININE 2.91* 04/08/2015 0319   CALCIUM 7.6* 04/08/2015 0319   GFRNONAA 23* 04/08/2015 0319   GFRAA 26* 04/08/2015 0319    COAGS: Lab Results  Component Value Date   INR 1.61* 04/08/2015   INR 1.93* 04/07/2015   INR 2.26* 04/07/2015     Non-Invasive Vascular Imaging:    Vascular Ultrasound (04/07/15) Right Lower Extremity Arterial Duplex has been completed.  The right femoral artery appears to dilate from 0.96cm to 4.76cm in its proximal segment in the long axis, and in the transverse plane measures 6.3cm; this is in the area of the pulsatile palpable mass. This is suggestive of a possible aneurysm. Just distal to this area, the femoral artery appears to be occluded with internal echoes; suggestive of thrombus versus plaque versus unknown  etiology. There is mid to distal thigh collateral flow and reconstitution of the femoral artery in the distal segment with severely dampened monophasic waveforms. There is monophasic flow through to the  distal posterior tibial and dorsalis pedis arteries.  ABIs 04/07/15    RIGHT    LEFT    PRESSURE WAVEFORM  PRESSURE WAVEFORM  BRACHIAL 97 Triphasic BRACHIAL 114 Triphasic  DP 27 Severely dampened monophasic DP 119 Triphasic  AT   AT    PT 58 Dampened monophasic PT 113 Triphasic  PER   PER    GREAT TOE No discernable waveform NA GREAT TOE 52 NA    RIGHT LEFT  ABI 0.51 1.04        ASSESSMENT: This is a 59 y.o. 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 Other active problems: Sepsis, UTI, ARF coagulopathy, left radial fracture  PLAN:  -He likely has chronic right lower extremity peripheral arterial disease given his ABIs. Bluish discoloration of right toes likely secondary to thromboemboli for his femoral aneurysm. Would normally start anticoagulation but patient is already coagulopathic secondary to acute illness. -Motor and sensory intact with doppler flow of right foot. No urgent indication for surgery. Will need to have an arteriogram to evaluate his PAD and plan for right femoral aneurysm repair/bypass.  -Unfortunately current medical state including ARF and coagulopathy not suitable for performing arteriogram at this point.  -Blood cultures for gram positive cocci and gram negative cocci. Continue empiric antibiotics.  -Will follow along. Dr. Imogene Burn to make further recommendations.   Maris Berger, PA-C [facility]  Office: [phone] Pager: [phone]  Addendum  I have independently interviewed and examined the patient, and I agree with the physician assistant's findings.  Right leg demonstrates pulsatile mass in the proximal thigh with some erythema  at this site.  This patient reported injected this site with a clean sterile needle for his testosterone injection.  I would not have expected gram negative organisms from an injection, so I doubt the aneurysm is the source for this patient's sepsis, though mycotic aneurysm is always in the differential.  Patient's foot looks like he is trashing to his feet from the femoral aneurysm.  However, pt has evidence of collateral flow to the right foot as evident with an ABI of 0.51.   - Once medical stable and kidney recovered will need CTA abd/pelvis with bilateral leg runoff to screen for AAA, bilateral femoral aneurysms, and popliteal aneurysms.   CTA gives more accurate anatomic information on the aneurysm. - R leg angiography will also be needed to help with determination of reconstruction options.  Angiography is better for determining patency of lumens. - Pt currently auto-anticoagulated.  He will need anticoagulation once his sepsis/shock improves to help lysis of thromboembolism in his R foot.   - This patient is at risk of limb loss due to the thromboembolism. - I don't think he is stable enough to explore his right leg at this point.  The preop anatomic studies would help the operative planning also.   - BLE GSV mapping - CT R leg to evaluate R aneurysm for any infectious findings.  Leonides Sake, MD [facility] [place] Office: [phone] Pager: [phone]  04/08/2015, 10:14 AM  Addendum  I reviewed the R femur CT.  I don't see any obvious fluid collections consistent with an abscess.    Leonides Sake, MD [facility] [place] Office: [phone] Pager: [phone]  04/08/2015, 12:51 PM

## 2015-04-08 NOTE — Progress Notes (Signed)
S:  L hand still hurts  O:Blood pressure 92/51, pulse 110, temperature 98.2 F (36.8 C), temperature source Oral, resp. rate 17, height  (1.905 m), weight 103.42 kg (228 lb), SpO2 100 %.  L hand and fingers till with mod swelling, cap refill adequate, sensation intact  A: trauma L hand with radius fx, contusion/sprain   P:splint, elevation, instructed on finger motion to prevent stiffness; f/u in office in ~2wks

## 2015-04-08 NOTE — Progress Notes (Signed)
VASCULAR LAB PRELIMINARY  PRELIMINARY  PRELIMINARY  PRELIMINARY  Right Lower Extremity Vein Map    Right Great Saphenous Vein   Segment Diameter Comment  1. Origin 1.0 cm   2. High Thigh 5.63mm   3. Mid Thigh 5.13mm   4. Low Thigh 6.67mm   5. At Knee 5.24mm   6. High Calf 5.59mm   7. Low Calf 3.55mm   8. Ankle 3.32mm     Left Lower Extremity Vein Map    Left Great Saphenous Vein   Segment Diameter Comment  1. Origin 1.1 cm   2. High Thigh 7.93mm   3. Mid Thigh 4.23mm   4. Low Thigh 5.25mm   5. At Knee 5.88mm   6. High Calf 4.46mm   7. Low Calf 3.56mm   8. Ankle 4..6mm    mm     [name], BS, RDMS, RVT

## 2015-04-08 NOTE — Progress Notes (Signed)
Initial Nutrition Assessment  DOCUMENTATION CODES:   Not applicable  INTERVENTION:    Chocolate Ensure Enlive po BID, each supplement provides 350 kcal and 20 grams of protein  NUTRITION DIAGNOSIS:   Inadequate oral intake related to poor appetite as evidenced by per patient/family report.  GOAL:   Patient will meet greater than or equal to 90% of their needs  MONITOR:   PO intake, Supplement acceptance, Labs, Weight trends  REASON FOR ASSESSMENT:   Malnutrition Screening Tool    ASSESSMENT:   59 year old gentleman history of peptic ulcer disease, history of MRSA abscesses, hyperlipidemia, peripheral neuropathy, chronic pain who presented to the ED with severe left hand and upper extremity pain. Patient was seen in the ED and noted to be septic with heart rate as high as 152 rest her rate of 28 initial blood pressure was 80/68.  Labs reviewed: sodium low, BUN and creatinine are elevated. Patient c/o poor appetite and weight loss over the past month. He has tried Ensure supplements in the past and liked them. Agreed to chocolate Ensure between meals. Nutrition-Focused physical exam completed. Findings are no fat depletion, no muscle depletion, and mild edema.   Diet Order:  Diet regular Room service appropriate?: Yes; Fluid consistency:: Thin  Skin:  Reviewed, no issues  Last BM:  9/11  Height:   Ht Readings from Last 1 Encounters:  04/06/15  (1.905 m)    Weight:   Wt Readings from Last 1 Encounters:  04/06/15 228 lb (103.42 kg)    Ideal Body Weight:  89.1 kg  BMI:  Body mass index is 28.5 kg/(m^2).  Estimated Nutritional Needs:   Kcal:  2300-2500  Protein:  110-130 gm  Fluid:  2.3-2.5 L  EDUCATION NEEDS:   No education needs identified at this time  Joaquin Courts, RD, LDN, CNSC Pager [phone] After Hours Pager [phone]

## 2015-04-08 NOTE — Progress Notes (Signed)
[facility] TEAM 1 - Stepdown/ICU TEAM PROGRESS NOTE  [name] ZOX:[id number] DOB: May 26, 1956 DOA: 04/06/2015 PCP: [name], MD  Admit HPI / Brief Narrative: 59 y.o. Male with PUD, HLD, history of MRSA abscesses, peripheral neuropathy, and chronic pain who presented to [facility] with severe left hand pain. He was found to be septic with acute kidney injury and left hand cellulitis. After admission he was noted to have a dusky swollen right foot. The patient reported that approximately 2 weeks prior he started feeling poorly and eating less. 9/7 he could not feel his right foot and fell as a result. He landed on his left wrist, shoulder, and back side. Since then his left hand has been very painful and his wrist and arm had begun to swell. He has developed thick erythematous streaking on his left arm.    In the ER: Lactic acid was 4.16, INR is 2.26 (not on anticoag), PTT is 24.7, WBC is 31.3, mag 1.3, potassium 2.7, procalcitonin 9.72. BP was 80/68 He received 3L of IV bolus in the ER.  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 Reportedly the patient injected his groin with testosterone 3 weeks ago.  HPI/Subjective: The patient is alert and conversant.  He complains of diminished pain in the left hand.  He continues to have pain in the right foot but states that it is improved as well.  He denies shortness of breath or chest pain at the present time.  Assessment/Plan:  Severe sepsis - S aureus bacteremia  -Multiple potential sourcesl: L hand cellulitis, UTI/pyelonephritis, R groin puncture/aneursym -Patient was hypotensive, tachycardic, with a white count of 31.3 on initial workup. 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 4.4 on admission.  Improving with volume resuscitation Hold any nephrotoxic medications.  Left hand cellulitis with history of MRSA abscess [name], hand surgeon is directing care this issue - continue Vanco and Zosyn  Possible pyelonephritis Urinalysis suggestive of infection - adequately cover with broad-spectrum empiric antibiotics - follow urine culture  Elevated INR with coagulopathy Felt to be due to sepsis - improving - continue to follow trend  Hypokalemia and hypomagnesemia Corrected to normal  Chronic pain has been pain medications for 12 years - managed by Dr. [name] [facility]?). Also takes high dose scheduled klonopin. Have ordered chronic pain meds as he takes at home with holding parameters. Will order klonopin PRN.  Code Status: FULL Family Communication: no family present at time of exam Disposition Plan: SDU  Consultants: Hand Surgery Vascular Surgery  ID  Procedures: none  Antibiotics: Zosyn 9/10 > Vancomycin 9/10 >  DVT prophylaxis: SCDs   Objective: Blood pressure 99/53, pulse 111, temperature 98.8 F (37.1 C), temperature source Oral, resp. rate 14, height 6\' 3"  (1.905 m), weight 103.42 kg (228 lb), SpO2 97 %.  Intake/Output Summary (Last 24 hours) at 04/08/15 1611 Last data filed at 04/08/15 1100  Gross per 24 hour  Intake   3350 ml  Output    975 ml  Net   2375 ml  Exam: General: No acute respiratory distress - alert and conversant Lungs: Clear to auscultation bilaterally without wheezes or crackles Cardiovascular: Tachycardic but regular without appreciable murmur or gallop - no rub Abdomen: Nontender, nondistended, soft, bowel sounds positive, no rebound, no ascites, no appreciable mass Extremities: The left hand is edematous though  there is no erythema or calor - the left hand is presently in a splint as applied by hand surgery - the right foot has a dusky appearance and is quite edematous as well with areas of punctate purpura - the right groin is tense with palpable induration  Data Reviewed: Basic Metabolic Panel:  Recent Labs Lab 04/07/15 0008 04/07/15 0113 04/07/15 0737 04/08/15 0319  NA 132*  --  132* 134*  K 2.9* 2.7* 3.4* 4.3  CL 98*  --  105 107  CO2 17*  --  17* 16*  GLUCOSE 141*  --  109* 108*  BUN 56*  --  48* 52*  CREATININE 4.47*  --  3.30* 2.91*  CALCIUM 8.3*  --  7.6* 7.6*  MG  --  1.3*  --  2.0    CBC:  Recent Labs Lab 04/07/15 0008 04/07/15 1610 04/08/15 0319  WBC 31.3*  --  22.6*  NEUTROABS 28.7*  --   --   HGB 11.6*  --  9.4*  HCT 34.0*  --  27.4*  MCV 77.1*  --  78.3  PLT 255 230 249    Liver Function Tests:  Recent Labs Lab 04/07/15 0008 04/07/15 1610 04/08/15 0319  AST 34  --  24  ALT 21  --  21  ALKPHOS 129*  --  81  BILITOT 2.3* 2.0* 1.8*  PROT 6.8  --  5.2*  ALBUMIN 2.4*  --  1.6*    Coags:  Recent Labs Lab 04/07/15 0737 04/07/15 1610 04/08/15 0319  INR 2.26* 1.93* 1.61*    Recent Labs Lab 04/07/15 0737 04/07/15 1610  APTT 39* 37    Cardiac Enzymes:  Recent Labs Lab 04/07/15 0008 04/07/15 0113  CKTOTAL  --  495*  TROPONINI <0.03  --      Recent Results (from the past 240 hour(s))  Culture, blood (routine x 2)     Status: None (Preliminary result)   Collection Time: 04/06/15 11:50 PM  Result Value Ref Range Status   Specimen Description BLOOD RIGHT HAND  Final   Special Requests   Final    BOTTLES DRAWN AEROBIC AND ANAEROBIC AER 10cc ANA 10cc   Culture  Setup Time   Final    GRAM POSITIVE COCCI IN CLUSTERS IN BOTH AEROBIC AND ANAEROBIC BOTTLES CORRECTED RESULTS CALLED TO: W DAVIS,RN AT 1151 04/08/15 BY L BENFIELD PREVIOUSLY REPORTED AS GRAM NEGATIVE COCCI AND GRAM VARIABLE COCCI    Culture   Final    STAPHYLOCOCCUS  AUREUS Performed at [facility]    Report Status PENDING  Incomplete  Culture, blood (routine x 2)     Status: None (Preliminary result)   Collection Time: 04/07/15 12:05 AM  Result Value Ref Range Status   Specimen Description BLOOD RIGHT FOREARM  Final   Special Requests   Final    BOTTLES DRAWN AEROBIC AND ANAEROBIC AER 5cc ANA 5cc   Culture  Setup Time   Final    GRAM POSITIVE COCCI IN CLUSTERS IN BOTH AEROBIC AND ANAEROBIC BOTTLES CORRECTED RESULTS CALLED TO: W DAVIS,RN AT 1151 04/08/15 BY L BENFIELD PREVIOUSLY REPORTED AS GRAM NEGATIVE COCCI AND GRAM VARIABLE COCCI  Culture   Final    STAPHYLOCOCCUS AUREUS Performed at [facility]    Report Status PENDING  Incomplete  MRSA PCR Screening     Status: None   Collection Time: 04/07/15  6:30 AM  Result Value Ref Range Status   MRSA by PCR NEGATIVE NEGATIVE Final    Comment:        The GeneXpert MRSA Assay (FDA approved for NASAL specimens only), is one component of a comprehensive MRSA colonization surveillance program. It is not intended to diagnose MRSA infection nor to guide or monitor treatment for MRSA infections.   Urine culture     Status: None   Collection Time: 04/07/15  7:55 AM  Result Value Ref Range Status   Specimen Description URINE, RANDOM  Final   Special Requests NONE  Final   Culture NO GROWTH 1 DAY  Final   Report Status 04/08/2015 FINAL  Final     Studies:   Recent x-ray studies have been reviewed in detail by the Attending Physician  Scheduled Meds:  Scheduled Meds: . docusate sodium  100 mg Oral BID  . feeding supplement (ENSURE ENLIVE)  237 mL Oral BID BM  . heparin subcutaneous  5,000 Units Subcutaneous 3 times per day  . morphine  90 mg Oral 2 times per day  . naloxegol oxalate  25 mg Oral Daily  . oxyCODONE  30 mg Oral 2 times per day  . piperacillin-tazobactam (ZOSYN)  IV  3.375 g Intravenous 3 times per day  . sodium chloride  3 mL Intravenous Q12H  . Tdap  0.5  mL Intramuscular Once  . vancomycin  1,250 mg Intravenous Q24H    Time spent on care of this patient: 35 mins   Devlyn Parish T , MD   [facility] Office  [phone] Pager - Text Page per Loretha Stapler as per below:  On-Call/Text Page:      [email or web address]      password [email or web address]  If 7PM-7AM, please contact night-coverage [email or web address] Password [email or web address] 04/08/2015, 4:11 PM   LOS: 1 day

## 2015-04-08 NOTE — Progress Notes (Signed)
Pt stated the anxiety he had earlier this morning is MUCH improved after the Clonazepam.

## 2015-04-09 DIAGNOSIS — R791 Abnormal coagulation profile: Secondary | ICD-10-CM

## 2015-04-09 DIAGNOSIS — E876 Hypokalemia: Secondary | ICD-10-CM

## 2015-04-09 DIAGNOSIS — N179 Acute kidney failure, unspecified: Secondary | ICD-10-CM | POA: Diagnosis present

## 2015-04-09 DIAGNOSIS — R7881 Bacteremia: Secondary | ICD-10-CM | POA: Diagnosis present

## 2015-04-09 DIAGNOSIS — S5292XA Unspecified fracture of left forearm, initial encounter for closed fracture: Secondary | ICD-10-CM | POA: Diagnosis present

## 2015-04-09 LAB — CULTURE, BLOOD (ROUTINE X 2)

## 2015-04-09 LAB — COMPREHENSIVE METABOLIC PANEL
ALT: 22 U/L (ref 17–63)
ANION GAP: 7 (ref 5–15)
AST: 34 U/L (ref 15–41)
Albumin: 1.3 g/dL — ABNORMAL LOW (ref 3.5–5.0)
Alkaline Phosphatase: 142 U/L — ABNORMAL HIGH (ref 38–126)
BUN: 45 mg/dL — ABNORMAL HIGH (ref 6–20)
CHLORIDE: 110 mmol/L (ref 101–111)
CO2: 19 mmol/L — AB (ref 22–32)
CREATININE: 2.33 mg/dL — AB (ref 0.61–1.24)
Calcium: 7.6 mg/dL — ABNORMAL LOW (ref 8.9–10.3)
GFR, EST AFRICAN AMERICAN: 34 mL/min — AB (ref >60)
GFR, EST NON AFRICAN AMERICAN: 30 mL/min — AB (ref >60)
Glucose, Bld: 136 mg/dL — ABNORMAL HIGH (ref 65–99)
POTASSIUM: 3.5 mmol/L (ref 3.5–5.1)
SODIUM: 136 mmol/L (ref 135–145)
Total Bilirubin: 1.5 mg/dL — ABNORMAL HIGH (ref 0.3–1.2)
Total Protein: 6.2 g/dL — ABNORMAL LOW (ref 6.5–8.1)

## 2015-04-09 LAB — PROTIME-INR
INR: 1.39 (ref 0.00–1.49)
PROTHROMBIN TIME: 17.2 s — AB (ref 11.6–15.2)

## 2015-04-09 LAB — CBC
HCT: 27.7 % — ABNORMAL LOW (ref 39.0–52.0)
HEMOGLOBIN: 9.2 g/dL — AB (ref 13.0–17.0)
MCH: 26.3 pg (ref 26.0–34.0)
MCHC: 33.2 g/dL (ref 30.0–36.0)
MCV: 79.1 fL (ref 78.0–100.0)
PLATELETS: 280 10*3/uL (ref 150–400)
RBC: 3.5 MIL/uL — AB (ref 4.22–5.81)
RDW: 17.1 % — ABNORMAL HIGH (ref 11.5–15.5)
WBC: 23.5 10*3/uL — AB (ref 4.0–10.5)

## 2015-04-09 LAB — HIV ANTIBODY (ROUTINE TESTING W REFLEX): HIV SCREEN 4TH GENERATION: NONREACTIVE

## 2015-04-09 LAB — APTT: APTT: 34 s (ref 24–37)

## 2015-04-09 NOTE — Progress Notes (Signed)
INFECTIOUS DISEASE PROGRESS NOTE  ID: [name] is a 59 y.o. male with  Principal Problem:   Severe sepsis Active Problems:   Chronic pain syndrome   History of duodenal ulcer   ARF (acute renal failure)   Cellulitis of left hand   Elevated INR   Foot swelling   Elevated bilirubin   Hypokalemia   Hypomagnesemia   Femoral artery aneurysm, right   UTI (lower urinary tract infection)   Bacteremia  Subjective: Without complaints  Abtx:  Anti-infectives    Start     Dose/Rate Route Frequency Ordered Stop   04/08/15 0900  vancomycin (VANCOCIN) 1,250 mg in sodium chloride 0.9 % 250 mL IVPB     1,250 mg 166.7 mL/hr over 90 Minutes Intravenous Every 24 hours 04/07/15 0835     04/07/15 1100  piperacillin-tazobactam (ZOSYN) IVPB 3.375 g     3.375 g 12.5 mL/hr over 240 Minutes Intravenous 3 times per day 04/07/15 0834     04/07/15 0845  vancomycin (VANCOCIN) 1,250 mg in sodium chloride 0.9 % 250 mL IVPB     1,250 mg 166.7 mL/hr over 90 Minutes Intravenous  Once 04/07/15 0834 04/07/15 1101   04/07/15 0730  piperacillin-tazobactam (ZOSYN) IVPB 3.375 g  Status:  Discontinued     3.375 g 100 mL/hr over 30 Minutes Intravenous  Once 04/07/15 0723 04/07/15 0727   04/07/15 0730  vancomycin (VANCOCIN) IVPB 1000 mg/200 mL premix  Status:  Discontinued     1,000 mg 200 mL/hr over 60 Minutes Intravenous  Once 04/07/15 0723 04/07/15 0727   04/07/15 0724  famciclovir (FAMVIR) tablet 500 mg     500 mg Oral 2 times daily PRN 04/07/15 0729     04/07/15 0215  vancomycin (VANCOCIN) IVPB 1000 mg/200 mL premix     1,000 mg 200 mL/hr over 60 Minutes Intravenous  Once 04/07/15 0212 04/07/15 0311   04/07/15 0215  piperacillin-tazobactam (ZOSYN) IVPB 3.375 g     3.375 g 100 mL/hr over 30 Minutes Intravenous  Once 04/07/15 0212 04/07/15 0503      Medications:  Scheduled: . docusate sodium  100 mg Oral BID  . feeding supplement (ENSURE ENLIVE)  237 mL Oral BID BM  . morphine  90 mg Oral 2 times  per day  . naloxegol oxalate  25 mg Oral Daily  . piperacillin-tazobactam (ZOSYN)  IV  3.375 g Intravenous 3 times per day  . sodium chloride  3 mL Intravenous Q12H  . Tdap  0.5 mL Intramuscular Once  . vancomycin  1,250 mg Intravenous Q24H  . venlafaxine XR  37.5 mg Oral Q breakfast    Objective: Vital signs in last 24 hours: Temp:  [98.7 F (37.1 C)-100.2 F (37.9 C)] 100.2 F (37.9 C) (09/13 0759) Pulse Rate:  [110-119] 119 (09/13 0400) Resp:  [14-17] 14 (09/13 0400) BP: (92-114)/(48-64) 114/51 mmHg (09/13 0400) SpO2:  [96 %-100 %] 97 % (09/13 0400)   General appearance: alert, cooperative and no distress Resp: clear to auscultation bilaterally Cardio: regular rate and rhythm GI: normal findings: bowel sounds normal and soft, non-tender Extremities: ischemic areas on R foot.   Lab Results  Recent Labs  04/08/15 0319 04/09/15 0310  WBC 22.6* 23.5*  HGB 9.4* 9.2*  HCT 27.4* 27.7*  NA 134* 136  K 4.3 3.5  CL 107 110  CO2 16* 19*  BUN 52* 45*  CREATININE 2.91* 2.33*   Liver Panel  Recent Labs  04/07/15 1610 04/08/15 0319 04/09/15 0310  PROT  --  5.2* 6.2*  ALBUMIN  --  1.6* 1.3*  AST  --  24 34  ALT  --  21 22  ALKPHOS  --  81 142*  BILITOT 2.0* 1.8* 1.5*  BILIDIR 1.1* 0.6*  --   IBILI 0.9 1.2*  --    Sedimentation Rate No results for input(s): ESRSEDRATE in the last 72 hours. C-Reactive Protein No results for input(s): CRP in the last 72 hours.  Microbiology: Recent Results (from the past 240 hour(s))  Culture, blood (routine x 2)     Status: None (Preliminary result)   Collection Time: 04/06/15 11:50 PM  Result Value Ref Range Status   Specimen Description BLOOD RIGHT HAND  Final   Special Requests   Final    BOTTLES DRAWN AEROBIC AND ANAEROBIC AER 10cc ANA 10cc   Culture  Setup Time   Final    GRAM POSITIVE COCCI IN CLUSTERS IN BOTH AEROBIC AND ANAEROBIC BOTTLES CORRECTED RESULTS CALLED TO: W DAVIS,RN AT 1151 04/08/15 BY L  BENFIELD PREVIOUSLY REPORTED AS GRAM NEGATIVE COCCI AND GRAM VARIABLE COCCI    Culture   Final    STAPHYLOCOCCUS AUREUS Performed at [facility]    Report Status PENDING  Incomplete  Culture, blood (routine x 2)     Status: None (Preliminary result)   Collection Time: 04/07/15 12:05 AM  Result Value Ref Range Status   Specimen Description BLOOD RIGHT FOREARM  Final   Special Requests   Final    BOTTLES DRAWN AEROBIC AND ANAEROBIC AER 5cc ANA 5cc   Culture  Setup Time   Final    GRAM POSITIVE COCCI IN CLUSTERS IN BOTH AEROBIC AND ANAEROBIC BOTTLES CORRECTED RESULTS CALLED TO: W DAVIS,RN AT 1151 04/08/15 BY L BENFIELD PREVIOUSLY REPORTED AS GRAM NEGATIVE COCCI AND GRAM VARIABLE COCCI    Culture   Final    STAPHYLOCOCCUS AUREUS Performed at [facility]    Report Status PENDING  Incomplete  MRSA PCR Screening     Status: None   Collection Time: 04/07/15  6:30 AM  Result Value Ref Range Status   MRSA by PCR NEGATIVE NEGATIVE Final    Comment:        The GeneXpert MRSA Assay (FDA approved for NASAL specimens only), is one component of a comprehensive MRSA colonization surveillance program. It is not intended to diagnose MRSA infection nor to guide or monitor treatment for MRSA infections.   Urine culture     Status: None   Collection Time: 04/07/15  7:55 AM  Result Value Ref Range Status   Specimen Description URINE, RANDOM  Final   Special Requests NONE  Final   Culture NO GROWTH 1 DAY  Final   Report Status 04/08/2015 FINAL  Final    Studies/Results: Mr Hand Left Wo Contrast  04/07/2015   CLINICAL DATA:  Cellulitis of the LEFT hand. LEFT hand pain and swelling. Fall last Wednesday.  EXAM: MRI OF THE LEFT HAND WITHOUT CONTRAST  TECHNIQUE: Multiplanar, multisequence MR imaging was performed. No intravenous contrast was administered.  COMPARISON:  None.  FINDINGS: There is diffuse edema in the LEFT hand. This is more prominent over the dorsal subcutaneous  tissues of the hand however there are fluid collections in the deep spaces of the hand. These demonstrate heterogeneous T2 signal on the axial images. In the absence of contrast, these are difficult to further evaluate the the heterogeneous material in the setting of recent trauma is most compatible with hematoma.  These extend into the intrinsic muscles of the hand, particularly along the volar aspect of the third, fourth and fifth metacarpals.  There is a dorsal wrist fluid collection compatible with hematoma. The scaphoid bone appears intact. There is no osteomyelitis.  Although this study was not optimized for the wrist, there appears to be dorsal band tear of the scapholunate ligament (image 14 series 13). Lunotriquetral ligament grossly appears intact. Scapholunate ligament is poorly evaluated.  At the more proximal margin of the scan on the T1 weighted imaging, there appears to be a fracture of the distal radius (image 28 series 14). Scattered areas of bone marrow edema are present throughout the carpal bones compatible with contusions. Correlating with prior plain films, there appear to be a small avulsion fracture off the dorsal aspect of the carpal bones on the lateral projection.  IMPRESSION: 1. Dorsal distal radius transverse fracture, partially visible. 2. Diffuse edema of the hand, likely reactive to distal radius fracture. 3. Tear of the dorsal band of the scapholunate ligament. 4. Multiple small deep fluid collections in the hands are most compatible with hematoma given the heterogeneous T2 signal. In this patient with an elevated white blood cell count, superimposed infection is possible but this would be rare and in the setting of wrist trauma, ordinary hematoma is most likely. 5. Consider a noncontrast CT the wrist for further assessment of the distal radius fracture.   Electronically Signed   By: Andreas Newport M.D.   On: 04/07/2015 14:38   Ct Femur Right Wo Contrast  04/08/2015   CLINICAL  DATA:  Pain post fall, right femoral aneurysm likely causing thromboembolism to his right lower extremity. Other active problems: Sepsis, UTI, ARF coagulopathy, left radial fracture.  EXAM: CT OF THE RIGHT FEMUR WITHOUT CONTRAST  TECHNIQUE: Multidetector CT imaging was performed according to the standard protocol. Multiplanar CT image reconstructions were also generated.  COMPARISON:  None.  FINDINGS: 7.4 cm nearly isoechoic mass deep to the sartorius in the proximal thigh, along the course of the proximal SFA, which is not identified separately at the level of the process. Prominent right inguinal adenopathy up to 13 mm short axis diameter. Changes of knee hemiarthroplasty. Degenerative spurring in the lateral compartment of the knee. Small effusion in the suprapatellar bursa. No fracture or other acute bone abnormality.  IMPRESSION: 1. 7.4 cm process involving or adjacent to the proximal right SFA. Considerations include aneurysm, pseudoaneurysm, hematoma, mass.   Electronically Signed   By: Corlis Leak M.D.   On: 04/08/2015 12:54     Assessment/Plan: Sepsis Staph bacteremia Would check TEE when able Await sensi on bcx Repeat BCx pending will stop zosyn  Wrist Fracture Appreciate ortho f/u  RLE aneurysm and thrombus Would query if this is due to his staph?  Acute Renal failure Watch with hydration, improving  Therapeutic Drug monitoring F/u vanco level Follow Cr.   Total days of antibiotics:2 vanco/zosyn         [name] Infectious Diseases (pager) [phone] [email or web address] 04/09/2015, 10:27 AM  LOS: 2 days

## 2015-04-09 NOTE — Progress Notes (Signed)
[place] TEAM 1 - Stepdown/ICU TEAM Progress Note  KAVONTE BEARSE ZOX:[id number] DOB: 1956-03-25 DOA: 04/06/2015 PCP: [name], MD  Admit HPI / Brief Narrative: 59 y.o.WM PMHx PUD, HLD, history of MRSA abscesses, peripheral neuropathy, and chronic pain   Presented to [facility] with severe left hand pain. He was found to be septic with acute kidney injury and left hand cellulitis. After admission he was noted to have a dusky swollen right foot. The patient reported that approximately 2 weeks prior he started feeling poorly and eating less. 9/7 he could not feel his right foot and fell as a result. He landed on his left wrist, shoulder, and back side. Since then his left hand has been very painful and his wrist and arm had begun to swell. He has developed thick erythematous streaking on his left arm.    In the ER: Lactic acid was 4.16, INR is 2.26 (not on anticoag), PTT is 24.7, WBC is 31.3, mag 1.3, potassium 2.7, procalcitonin 9.72. BP was 80/68 He received 3L of IV bolus in the ER. 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 Reportedly the patient injected his groin with testosterone 3 weeks ago.  HPI/Subjective: 9/13 A/O 4, in considerable pain right thigh/inguinal area, left hand and forearm.   Assessment/Plan: Severe sepsis - MSSA Bacteremia  -Multiple potential sourcesl: L hand cellulitis, UTI/pyelonephritis, R groin puncture/aneursym -Patient was hypotensive, tachycardic, with a white count of 31.3 on initial workup. 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 Improving with volume resuscitation Hold any nephrotoxic medications. -Continue hydration normal saline at 167ml/hr  Left hand radius fracture/ cellulitis w/Hx MRSA abscess [name], hand surgeon is directing care this issue,   Possible pyelonephritis -Creatinine improving with urinalysis   Elevated INR with coagulopathy -Resolving   Hypokalemia and hypomagnesemia -Corrected to normal  Chronic pain -been pain medications for 12 years - managed by Dr. [name] [facility]  -Takes high dose scheduled klonopin. Have ordered chronic pain meds as he takes at home with holding parameters.  -Klonopin 1 mg  TID PRN.    Code Status: FULL Family Communication: no family present at time of exam Disposition Plan: Per surgery    Consultants: Hand Surgery Dr.Brian L Chen Vascular Surgery  Dr. [name] (ID)  Procedure/Significant Events:    Culture 9/10 blood right hand positive MSSA 9/11 blood right forearm positive MSSA 9/11 negative MRSA by PCR 9/11 urine negative 9/12 blood right AC NGTD   Antibiotics: Zosyn 9/10 > stopped 9/13 Vancomycin 9/10 >   DVT prophylaxis: SCD   Devices    LINES / TUBES:      Continuous Infusions: . sodium chloride 160 mL/hr at 04/09/15 0332    Objective: VITAL SIGNS: Temp: 100.2 F (37.9 C) (09/13 0759) Temp Source: Oral (09/13 0759) BP: 114/51 mmHg (09/13 0400) Pulse Rate: 119 (09/13 0400) SPO2; FIO2:   Intake/Output Summary (Last 24 hours) at 04/09/15 0829 Last data filed at 04/09/15 0759  Gross per 24 hour  Intake    120  ml  Output   1850 ml  Net  -1730 ml     Exam: General: A/O 3, No acute respiratory distress Eyes: Negative headache, negative scleral hemorrhage ENT: Negative Runny nose, negative ear pain, negative gingival bleeding, Neck:  Negative scars, masses, torticollis, lymphadenopathy, JVD Lungs: Clear to  auscultation bilaterally without wheezes or crackles Cardiovascular: Regular rate and rhythm without murmur gallop or rub normal S1 and S2 Abdomen:negative abdominal pain, nondistended, positive soft, bowel sounds, no rebound, no ascites, no appreciable mass Extremities: right thigh/inguinal area swollen, erythematous, warm to touch, painful to palpation. Able to express frank pus from around site just distal to his inguinal area. Left arm/hand in a splint extremely swollen painful to the touch. Psychiatric:  Negative depression, negative anxiety, negative fatigue, negative mania  Neurologic:  Cranial nerves II through XII intact, tongue/uvula midline, all extremities muscle strength 5/5, sensation intact throughout,  negative dysarthria, negative expressive aphasia, negative receptive aphasia.   Data Reviewed: Basic Metabolic Panel:  Recent Labs Lab 04/07/15 0008 04/07/15 0113 04/07/15 0737 04/08/15 0319 04/09/15 0310  NA 132*  --  132* 134* 136  K 2.9* 2.7* 3.4* 4.3 3.5  CL 98*  --  105 107 110  CO2 17*  --  17* 16* 19*  GLUCOSE 141*  --  109* 108* 136*  BUN 56*  --  48* 52* 45*  CREATININE 4.47*  --  3.30* 2.91* 2.33*  CALCIUM 8.3*  --  7.6* 7.6* 7.6*  MG  --  1.3*  --  2.0  --    Liver Function Tests:  Recent Labs Lab 04/07/15 0008 04/07/15 1610 04/08/15 0319 04/09/15 0310  AST 34  --  24 34  ALT 21  --  21 22  ALKPHOS 129*  --  81 142*  BILITOT 2.3* 2.0* 1.8* 1.5*  PROT 6.8  --  5.2* 6.2*  ALBUMIN 2.4*  --  1.6* 1.3*   No results for input(s): LIPASE, AMYLASE in the last 168 hours. No results for input(s): AMMONIA in the last 168 hours. CBC:  Recent Labs Lab 04/07/15 0008 04/07/15 1610 04/08/15 0319 04/09/15 0310  WBC 31.3*  --  22.6* 23.5*  NEUTROABS 28.7*  --   --   --   HGB 11.6*  --  9.4* 9.2*  HCT 34.0*  --  27.4* 27.7*  MCV 77.1*  --  78.3 79.1  PLT 255 230 249 280   Cardiac Enzymes:  Recent Labs Lab 04/07/15 0008 04/07/15 0113  CKTOTAL  --   495*  TROPONINI <0.03  --    BNP (last 3 results) No results for input(s): BNP in the last 8760 hours.  ProBNP (last 3 results) No results for input(s): PROBNP in the last 8760 hours.  CBG: No results for input(s): GLUCAP in the last 168 hours.  Recent Results (from the past 240 hour(s))  Culture, blood (routine x 2)     Status: None (Preliminary result)   Collection Time: 04/06/15 11:50 PM  Result Value Ref Range Status   Specimen Description BLOOD RIGHT HAND  Final   Special Requests   Final    BOTTLES DRAWN AEROBIC AND ANAEROBIC AER 10cc ANA 10cc   Culture  Setup Time   Final    GRAM POSITIVE COCCI IN CLUSTERS IN BOTH AEROBIC AND ANAEROBIC BOTTLES CORRECTED RESULTS CALLED TO: W DAVIS,RN AT 1151 04/08/15 BY L BENFIELD PREVIOUSLY REPORTED AS GRAM NEGATIVE COCCI AND GRAM VARIABLE COCCI    Culture   Final  STAPHYLOCOCCUS AUREUS Performed at [facility]    Report Status PENDING  Incomplete  Culture, blood (routine x 2)     Status: None (Preliminary result)   Collection Time: 04/07/15 12:05 AM  Result Value Ref Range Status   Specimen Description BLOOD RIGHT FOREARM  Final   Special Requests   Final    BOTTLES DRAWN AEROBIC AND ANAEROBIC AER 5cc ANA 5cc   Culture  Setup Time   Final    GRAM POSITIVE COCCI IN CLUSTERS IN BOTH AEROBIC AND ANAEROBIC BOTTLES CORRECTED RESULTS CALLED TO: W DAVIS,RN AT 1151 04/08/15 BY L BENFIELD PREVIOUSLY REPORTED AS GRAM NEGATIVE COCCI AND GRAM VARIABLE COCCI    Culture   Final    STAPHYLOCOCCUS AUREUS Performed at [facility]    Report Status PENDING  Incomplete  MRSA PCR Screening     Status: None   Collection Time: 04/07/15  6:30 AM  Result Value Ref Range Status   MRSA by PCR NEGATIVE NEGATIVE Final    Comment:        The GeneXpert MRSA Assay (FDA approved for NASAL specimens only), is one component of a comprehensive MRSA colonization surveillance program. It is not intended to diagnose MRSA infection nor to  guide or monitor treatment for MRSA infections.   Urine culture     Status: None   Collection Time: 04/07/15  7:55 AM  Result Value Ref Range Status   Specimen Description URINE, RANDOM  Final   Special Requests NONE  Final   Culture NO GROWTH 1 DAY  Final   Report Status 04/08/2015 FINAL  Final     Studies:  Recent x-ray studies have been reviewed in detail by the Attending Physician  Scheduled Meds:  Scheduled Meds: . docusate sodium  100 mg Oral BID  . feeding supplement (ENSURE ENLIVE)  237 mL Oral BID BM  . morphine  90 mg Oral 2 times per day  . naloxegol oxalate  25 mg Oral Daily  . piperacillin-tazobactam (ZOSYN)  IV  3.375 g Intravenous 3 times per day  . sodium chloride  3 mL Intravenous Q12H  . Tdap  0.5 mL Intramuscular Once  . vancomycin  1,250 mg Intravenous Q24H  . venlafaxine XR  37.5 mg Oral Q breakfast    Time spent on care of this patient: 40 mins   Idalie Canto, Roselind Messier , MD  [facility] Office  [phone] Pager [phone]  On-Call/Text Page:      [email or web address]      password [email or web address]  If 7PM-7AM, please contact night-coverage [email or web address] Password [email or web address] 04/09/2015, 8:29 AM   LOS: 2 days   Care during the described time interval was provided by me .  I have reviewed this patient's available data, including medical history, events of note, physical examination, and all test results as part of my evaluation. I have personally reviewed and interpreted all radiology studies.   Carolyne Littles, MD [phone] Pager

## 2015-04-09 NOTE — Progress Notes (Signed)
   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  I would not rush into any surgery, however.  Ideally, I would like an angiogram before exploring the right leg as the outcome from any vascular procedure is significantly worse without adequate preop planning  Will see if his renal function normalizes and try to get the angiogram later this week or early next week with definitive mgmt of this aneurysm Tues/Wed next week week  Continue IV abx  In an infected field, graft cannot not be safely used.  Fortunately, he has adequate vein.  The problem may be that his native artery is so damaged that may need significant debridement.  In regards to this R foot thromboembolism, he is not a candidate for lytic therapy in this case.   Anticoagulation will be his best chance of salvaging his foot.  I have some concerns that some of his right foot he may not be salvageable.  Subjective   No complaints, feelign a little better  Objective Filed Vitals:   04/08/15 2000 04/08/15 2020 04/08/15 2320 04/09/15 0336  BP: 96/48     Pulse: 117     Temp:  99.2 F (37.3 C) 99.6 F (37.6 C) 99.9 F (37.7 C)  TempSrc:  Oral Oral Axillary  Resp: 16     Height:      Weight:      SpO2: 96%       Intake/Output Summary (Last 24 hours) at 04/09/15 0740 Last data filed at 04/09/15 0336  Gross per 24 hour  Intake    120 ml  Output   1550 ml  Net  -1430 ml   VASC  R pulsatile mass in R proximal thigh, blanching erythema  Laboratory CBC    Component Value Date/Time   WBC 23.5* 04/09/2015 0310   HGB 9.2* 04/09/2015 0310   HCT 27.7* 04/09/2015 0310   PLT 280 04/09/2015 0310    BMET    Component Value Date/Time   NA 136 04/09/2015 0310   K 3.5 04/09/2015 0310   CL 110 04/09/2015 0310   CO2 19* 04/09/2015 0310   GLUCOSE 136* 04/09/2015 0310   BUN 45* 04/09/2015 0310   CREATININE 2.33* 04/09/2015  0310   CALCIUM 7.6* 04/09/2015 0310   GFRNONAA 30* 04/09/2015 0310   GFRAA 34* 04/09/2015 0310    Leonides Sake, MD [facility] Office: [phone] Pager: [phone]  04/09/2015, 7:40 AM

## 2015-04-10 DIAGNOSIS — L02214 Cutaneous abscess of groin: Secondary | ICD-10-CM

## 2015-04-10 DIAGNOSIS — L0390 Cellulitis, unspecified: Secondary | ICD-10-CM | POA: Diagnosis present

## 2015-04-10 LAB — CBC WITH DIFFERENTIAL/PLATELET
BASOS ABS: 0 10*3/uL (ref 0.0–0.1)
Basophils Relative: 0 %
EOS ABS: 0 10*3/uL (ref 0.0–0.7)
Eosinophils Relative: 0 %
HCT: 25.5 % — ABNORMAL LOW (ref 39.0–52.0)
HEMOGLOBIN: 8.4 g/dL — AB (ref 13.0–17.0)
LYMPHS ABS: 1.4 10*3/uL (ref 0.7–4.0)
LYMPHS PCT: 5 %
MCH: 26.4 pg (ref 26.0–34.0)
MCHC: 32.9 g/dL (ref 30.0–36.0)
MCV: 80.2 fL (ref 78.0–100.0)
Monocytes Absolute: 1.1 10*3/uL — ABNORMAL HIGH (ref 0.1–1.0)
Monocytes Relative: 4 %
NEUTROS ABS: 26.1 10*3/uL — AB (ref 1.7–7.7)
Neutrophils Relative %: 91 %
Platelets: 296 10*3/uL (ref 150–400)
RBC: 3.18 MIL/uL — ABNORMAL LOW (ref 4.22–5.81)
RDW: 16.9 % — AB (ref 11.5–15.5)
WBC: 28.6 10*3/uL — AB (ref 4.0–10.5)

## 2015-04-10 LAB — MAGNESIUM: Magnesium: 1.5 mg/dL — ABNORMAL LOW (ref 1.7–2.4)

## 2015-04-10 LAB — COMPREHENSIVE METABOLIC PANEL
ALBUMIN: 1.2 g/dL — AB (ref 3.5–5.0)
ALT: 23 U/L (ref 17–63)
ANION GAP: 8 (ref 5–15)
AST: 37 U/L (ref 15–41)
Alkaline Phosphatase: 145 U/L — ABNORMAL HIGH (ref 38–126)
BUN: 29 mg/dL — ABNORMAL HIGH (ref 6–20)
CO2: 19 mmol/L — AB (ref 22–32)
Calcium: 7.6 mg/dL — ABNORMAL LOW (ref 8.9–10.3)
Chloride: 110 mmol/L (ref 101–111)
Creatinine, Ser: 1.86 mg/dL — ABNORMAL HIGH (ref 0.61–1.24)
GFR calc Af Amer: 45 mL/min — ABNORMAL LOW (ref >60)
GFR calc non Af Amer: 39 mL/min — ABNORMAL LOW (ref >60)
GLUCOSE: 129 mg/dL — AB (ref 65–99)
POTASSIUM: 3.1 mmol/L — AB (ref 3.5–5.1)
SODIUM: 137 mmol/L (ref 135–145)
Total Bilirubin: 2.1 mg/dL — ABNORMAL HIGH (ref 0.3–1.2)
Total Protein: 5.5 g/dL — ABNORMAL LOW (ref 6.5–8.1)

## 2015-04-10 LAB — LACTIC ACID, PLASMA: Lactic Acid, Venous: 1.7 mmol/L (ref 0.5–2.0)

## 2015-04-10 MED ORDER — ATENOLOL 100 MG PO TABS: 100.0000 mg | ORAL_TABLET | Freq: Every day | ORAL | Status: DC

## 2015-04-10 MED ORDER — HYDROMORPHONE HCL 2 MG PO TABS
1.0000 mg | ORAL_TABLET | ORAL | Status: DC | PRN
  Administered 2015-04-11 – 2015-04-12 (×3): 2 mg via ORAL
  Filled 2015-04-10 (×4): qty 1

## 2015-04-10 MED ORDER — LEVALBUTEROL HCL 0.63 MG/3ML IN NEBU: 0.6300 mg | INHALATION_SOLUTION | RESPIRATORY_TRACT | Status: DC | PRN

## 2015-04-10 MED ORDER — CEFAZOLIN SODIUM-DEXTROSE 2-3 GM-% IV SOLR
2.0000 g | Freq: Three times a day (TID) | INTRAVENOUS | Status: DC
  Administered 2015-04-10 – 2015-04-30 (×60): 2 g via INTRAVENOUS
  Filled 2015-04-10 (×64): qty 50

## 2015-04-10 MED ORDER — SODIUM CHLORIDE 0.9 % IV BOLUS (SEPSIS)
500.0000 mL | Freq: Once | INTRAVENOUS | Status: AC
  Administered 2015-04-10: 500 mL via INTRAVENOUS

## 2015-04-10 MED ORDER — CLONAZEPAM 0.5 MG PO TABS
0.5000 mg | ORAL_TABLET | Freq: Three times a day (TID) | ORAL | Status: DC | PRN
Start: 2015-04-10 — End: 2015-04-22
  Administered 2015-04-10 – 2015-04-22 (×17): 0.5 mg via ORAL
  Filled 2015-04-10 (×17): qty 1

## 2015-04-10 MED ORDER — POTASSIUM CHLORIDE CRYS ER 20 MEQ PO TBCR
40.0000 meq | EXTENDED_RELEASE_TABLET | Freq: Once | ORAL | Status: AC
  Administered 2015-04-10: 40 meq via ORAL
  Filled 2015-04-10: qty 2

## 2015-04-10 MED ORDER — ATENOLOL 12.5 MG HALF TABLET
12.5000 mg | ORAL_TABLET | Freq: Every day | ORAL | Status: DC
  Administered 2015-04-10 – 2015-04-14 (×4): 12.5 mg via ORAL
  Filled 2015-04-10 (×5): qty 1

## 2015-04-10 MED ORDER — HEPARIN BOLUS VIA INFUSION
4000.0000 [IU] | Freq: Once | INTRAVENOUS | Status: AC
  Administered 2015-04-10: 4000 [IU] via INTRAVENOUS
  Filled 2015-04-10: qty 4000

## 2015-04-10 MED ORDER — HEPARIN (PORCINE) IN NACL 100-0.45 UNIT/ML-% IJ SOLN
1600.0000 [IU]/h | INTRAMUSCULAR | Status: DC
  Administered 2015-04-10: 1250 [IU]/h via INTRAVENOUS
  Administered 2015-04-11: 1600 [IU]/h via INTRAVENOUS
  Filled 2015-04-10 (×3): qty 250

## 2015-04-10 NOTE — Progress Notes (Addendum)
ANTICOAGULATION CONSULT NOTE - Initial Consult  Pharmacy Consult for Heparin Indication: suspected thromboembolism to R foot from R groin aneurysm  Allergies  Allergen Reactions  . Imitrex [Sumatriptan] Other (See Comments)    Chest pain, 2004 (tolerates 50 mg prn)  . Verapamil Other (See Comments)    Causes pvc's    Patient Measurements: Height:  (190.5 cm) Weight: 228 lb (103.42 kg) IBW/kg (Calculated) : 84.5 Heparin Dosing Weight: 91 kg  Vital Signs: Temp: 98.8 F (37.1 C) (09/14 1508) Temp Source: Oral (09/14 1508) BP: 115/74 mmHg (09/14 1203) Pulse Rate: 119 (09/14 1203)  Labs:  Recent Labs  04/08/15 0319 04/09/15 0310 04/10/15 0247  HGB 9.4* 9.2* 8.4*  HCT 27.4* 27.7* 25.5*  PLT 249 280 296  APTT  --  34  --   LABPROT 19.1* 17.2*  --   INR 1.61* 1.39  --   CREATININE 2.91* 2.33* 1.86*    Estimated Creatinine Clearance: 57.8 mL/min (by C-G formula based on Cr of 1.86).   Medical History: Past Medical History  Diagnosis Date  . Hypertension   . Chronic knee pain   . Hyperlipemia   . PUD (peptic ulcer disease)    Assessment:   59 yr old male to begin IV heparin for suspected thromboembolism to R foot from R groin aneurysm, noted to be pulsatile, worriesome for possibly mycotic.  INR was 2.26 on 04/07/15, but had been taking Xarelto 10 mg daily, which he had at home from recent left knee revision. Xarelto had been stopped in May due to bleeding into knee per patient, but he had resumed taking Xarelto 10 mg daily 4 days prior to admission.   INR down to 1.39 pm  Received sq heparin x 3 doses 9/11-9/12.  Hgb down to 8.4, platelet count 296. ARF improving, creatinine down from 4.47 on 9/11 to 1.86 today.   Discussed briefly with Dr. Sharon Seller. For full-dose IV heparin, ok to bolus.  Goal of Therapy:  Heparin level 0.3-0.7 units/ml Monitor platelets by anticoagulation protocol: Yes   Plan:   Heparin 4000 units IV x 1.  Heparin drip to begin at 1250  units/hr  Heparin level ~ 6 hrs after drip begins.  Daily heparin level and CBC.  Monitor for any signs of bleeding.  Dennie Fetters, [place] Pager: [phone] 04/10/2015,4:43 PM

## 2015-04-10 NOTE — Progress Notes (Signed)
INFECTIOUS DISEASE PROGRESS NOTE  ID: [name] is a 59 y.o. male with  Principal Problem:   Severe sepsis Active Problems:   Chronic pain syndrome   History of duodenal ulcer   ARF (acute renal failure)   Cellulitis of left hand   Elevated INR   Foot swelling   Elevated bilirubin   Hypokalemia   Hypomagnesemia   Femoral artery aneurysm, right   UTI (lower urinary tract infection)   Bacteremia   Bacteremia due to Staphylococcus aureus   Acute renal failure syndrome   Fracture of left radius  Subjective: Without complaints  Abtx:  Anti-infectives    Start     Dose/Rate Route Frequency Ordered Stop   04/08/15 0900  vancomycin (VANCOCIN) 1,250 mg in sodium chloride 0.9 % 250 mL IVPB     1,250 mg 166.7 mL/hr over 90 Minutes Intravenous Every 24 hours 04/07/15 0835     04/07/15 1100  piperacillin-tazobactam (ZOSYN) IVPB 3.375 g  Status:  Discontinued     3.375 g 12.5 mL/hr over 240 Minutes Intravenous 3 times per day 04/07/15 0834 04/09/15 1042   04/07/15 0845  vancomycin (VANCOCIN) 1,250 mg in sodium chloride 0.9 % 250 mL IVPB     1,250 mg 166.7 mL/hr over 90 Minutes Intravenous  Once 04/07/15 0834 04/07/15 1101   04/07/15 0730  piperacillin-tazobactam (ZOSYN) IVPB 3.375 g  Status:  Discontinued     3.375 g 100 mL/hr over 30 Minutes Intravenous  Once 04/07/15 0723 04/07/15 0727   04/07/15 0730  vancomycin (VANCOCIN) IVPB 1000 mg/200 mL premix  Status:  Discontinued     1,000 mg 200 mL/hr over 60 Minutes Intravenous  Once 04/07/15 0723 04/07/15 0727   04/07/15 0724  famciclovir (FAMVIR) tablet 500 mg     500 mg Oral 2 times daily PRN 04/07/15 0729     04/07/15 0215  vancomycin (VANCOCIN) IVPB 1000 mg/200 mL premix     1,000 mg 200 mL/hr over 60 Minutes Intravenous  Once 04/07/15 0212 04/07/15 0311   04/07/15 0215  piperacillin-tazobactam (ZOSYN) IVPB 3.375 g     3.375 g 100 mL/hr over 30 Minutes Intravenous  Once 04/07/15 9604 04/07/15 0503      Medications:    Scheduled: . atenolol  12.5 mg Oral Daily  .  ceFAZolin (ANCEF) IV  2 g Intravenous 3 times per day  . docusate sodium  100 mg Oral BID  . feeding supplement (ENSURE ENLIVE)  237 mL Oral BID BM  . morphine  90 mg Oral 2 times per day  . naloxegol oxalate  25 mg Oral Daily  . sodium chloride  3 mL Intravenous Q12H  . Tdap  0.5 mL Intramuscular Once  . vancomycin  1,250 mg Intravenous Q24H  . venlafaxine XR  37.5 mg Oral Q breakfast    Objective: Vital signs in last 24 hours: Temp:  [97.3 F (36.3 C)-102.3 F (39.1 C)] 98.8 F (37.1 C) (09/14 1508) Pulse Rate:  [119-143] 119 (09/14 1203) Resp:  [13-26] 13 (09/14 1203) BP: (86-146)/(47-74) 115/74 mmHg (09/14 1203) SpO2:  [94 %-99 %] 99 % (09/14 1203)   General appearance: alert, cooperative and no distress Resp: clear to auscultation bilaterally Cardio: regular rate and rhythm GI: normal findings: bowel sounds normal and soft, non-tender Extremities: R groin grossly swollen, warm. erythema (decreased from marker outline)  Lab Results  Recent Labs  04/09/15 0310 04/10/15 0247  WBC 23.5* 28.6*  HGB 9.2* 8.4*  HCT 27.7* 25.5*  NA  136 137  K 3.5 3.1*  CL 110 110  CO2 19* 19*  BUN 45* 29*  CREATININE 2.33* 1.86*   Liver Panel  Recent Labs  04/08/15 0319 04/09/15 0310 04/10/15 0247  PROT 5.2* 6.2* 5.5*  ALBUMIN 1.6* 1.3* 1.2*  AST 24 34 37  ALT 21 22 23   ALKPHOS 81 142* 145*  BILITOT 1.8* 1.5* 2.1*  BILIDIR 0.6*  --   --   IBILI 1.2*  --   --    Sedimentation Rate No results for input(s): ESRSEDRATE in the last 72 hours. C-Reactive Protein No results for input(s): CRP in the last 72 hours.  Microbiology: Recent Results (from the past 240 hour(s))  Culture, blood (routine x 2)     Status: None   Collection Time: 04/06/15 11:50 PM  Result Value Ref Range Status   Specimen Description BLOOD RIGHT HAND  Final   Special Requests   Final    BOTTLES DRAWN AEROBIC AND ANAEROBIC AER 10cc ANA 10cc   Culture   Setup Time   Final    GRAM POSITIVE COCCI IN CLUSTERS IN BOTH AEROBIC AND ANAEROBIC BOTTLES CORRECTED RESULTS CALLED TO: W DAVIS,RN AT 1151 04/08/15 BY L BENFIELD PREVIOUSLY REPORTED AS GRAM NEGATIVE COCCI AND GRAM VARIABLE COCCI    Culture   Final    STAPHYLOCOCCUS AUREUS SUSCEPTIBILITIES PERFORMED ON PREVIOUS CULTURE WITHIN THE LAST 5 DAYS. Performed at [facility]    Report Status 04/09/2015 FINAL  Final  Culture, blood (routine x 2)     Status: None   Collection Time: 04/07/15 12:05 AM  Result Value Ref Range Status   Specimen Description BLOOD RIGHT FOREARM  Final   Special Requests   Final    BOTTLES DRAWN AEROBIC AND ANAEROBIC AER 5cc ANA 5cc   Culture  Setup Time   Final    GRAM POSITIVE COCCI IN CLUSTERS IN BOTH AEROBIC AND ANAEROBIC BOTTLES CORRECTED RESULTS CALLED TO: W DAVIS,RN AT 1151 04/08/15 BY L BENFIELD PREVIOUSLY REPORTED AS GRAM NEGATIVE COCCI AND GRAM VARIABLE COCCI    Culture   Final    STAPHYLOCOCCUS AUREUS BACILLUS SPECIES Standardized susceptibility testing for this organism is not available. CRITICAL RESULT CALLED TO, READ BACK BY AND VERIFIED WITH: Hollace Kinnier AT 1318 04/09/15 BY L BENFIELD Performed at [facility]    Report Status 04/09/2015 FINAL  Final   Organism ID, Bacteria STAPHYLOCOCCUS AUREUS  Final      Susceptibility   Staphylococcus aureus - MIC*    CIPROFLOXACIN <=0.5 SENSITIVE Sensitive     ERYTHROMYCIN <=0.25 SENSITIVE Sensitive     GENTAMICIN <=0.5 SENSITIVE Sensitive     OXACILLIN <=0.25 SENSITIVE Sensitive     TETRACYCLINE <=1 SENSITIVE Sensitive     VANCOMYCIN <=0.5 SENSITIVE Sensitive     TRIMETH/SULFA <=10 SENSITIVE Sensitive     CLINDAMYCIN <=0.25 SENSITIVE Sensitive     RIFAMPIN <=0.5 SENSITIVE Sensitive     Inducible Clindamycin NEGATIVE Sensitive     * STAPHYLOCOCCUS AUREUS  MRSA PCR Screening     Status: None   Collection Time: 04/07/15  6:30 AM  Result Value Ref Range Status   MRSA by PCR NEGATIVE  NEGATIVE Final    Comment:        The GeneXpert MRSA Assay (FDA approved for NASAL specimens only), is one component of a comprehensive MRSA colonization surveillance program. It is not intended to diagnose MRSA infection nor to guide or monitor treatment for MRSA infections.   Urine culture  Status: None   Collection Time: 04/07/15  7:55 AM  Result Value Ref Range Status   Specimen Description URINE, RANDOM  Final   Special Requests NONE  Final   Culture NO GROWTH 1 DAY  Final   Report Status 04/08/2015 FINAL  Final  Culture, blood (routine x 2)     Status: None (Preliminary result)   Collection Time: 04/08/15  6:21 PM  Result Value Ref Range Status   Specimen Description BLOOD RIGHT ANTECUBITAL  Final   Special Requests BOTTLES DRAWN AEROBIC ONLY 4CC  Final   Culture NO GROWTH < 24 HOURS  Final   Report Status PENDING  Incomplete    Studies/Results: No results found.   Assessment/Plan: Sepsis Staph bacteremia- MSSA 1/2 BCx+ Bacillus  Would check TEE when able Repeat BCx pending  Will continue vanco, add ancef  discuss with pharm regarding a monotherapy for this pt- invanz? Zosyn?  Could dismiss his bacillus as contaminant however he did recently inject self (testosterone) prior to  onset of this illness  Wrist Fracture Appreciate ortho f/u  RLE aneurysm and thrombus Would query if this is due to his staph?  Now on heparin  Acute Renal failure Watch with hydration, improving  Primary to address his poassium  Therapeutic Drug monitoring F/u vanco level Follow Cr (better).   Total days of antibiotics: 3 vanco          [name] Infectious Diseases (pager) [phone] [email or web address] 04/10/2015, 5:45 PM  LOS: 3 days

## 2015-04-10 NOTE — Progress Notes (Signed)
UR COMPLETED  

## 2015-04-10 NOTE — Progress Notes (Signed)
[place] TEAM 1 - Stepdown/ICU TEAM PROGRESS NOTE  [name] ZOX:[id number] DOB: February 20, 1956 DOA: 04/06/2015 PCP: [name], MD  Admit HPI / Brief Narrative: 59 y.o. Male with PUD, HLD, history of MRSA abscesses, peripheral neuropathy, and chronic pain who presented to [facility] with severe left hand pain. He was found to be septic with acute kidney injury and left hand cellulitis. After admission he was noted to have a dusky swollen right foot. The patient reported that approximately 2 weeks prior he started feeling poorly and eating less. 9/7 he could not feel his right foot and fell as a result. He landed on his left wrist, shoulder, and back side. Since then his left hand has been very painful and his wrist and arm had begun to swell. He has developed thick erythematous streaking on his left arm.    In the ER: Lactic acid was 4.16, INR is 2.26 (not on anticoag), PTT is 24.7, WBC is 31.3, mag 1.3, potassium 2.7, procalcitonin 9.72. BP was 80/68 He received 3L of IV bolus in the ER.  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 Reportedly the patient injected his groin with testosterone 3 weeks ago.  HPI/Subjective: The patient is somewhat somnolent but conversant.  He denies new complaints.  The pain in his left hand right groin and right foot persist but have not worsened.  He denies chest pain nausea vomiting shortness breath or abdominal pain.  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 Patient  injected his right groin with testosterone 3 weeks ago - has a 4 cm pulsating aneurysm worrisome for a possible mycotic aneurysm - Vasc Surgery following - anticoagulation only available treatment for the foot presently - patient to undergo angiogram when renal function will allow as planning phase for initial surgical correction - may very well require right foot amputation  Acute renal failure Creatinine is 0.9 at baseline - 4.4 on admission - continues to improve with volume resuscitation  Sinus tachycardia  At risk for DVT/PE - discuss anticoag w/ Vasc Surg as noted below - ddimer would not be helpful as will almost assuredly be elevated in this situation - can't obtain CTA presently due to renal fxn - may also be simple BB withdrawal - w/ improved BP will attempt to resume low dose BB - does not appear volume depleted at present - check TSH  Left hand cellulitis with radius fracture - history of MRSA abscess [name], hand surgeon is directing care this issue - to follow-up in his office in approximately 2 weeks  Possible pyelonephritis Urinalysis suggestive of infection - adequately cover with broad-spectrum empiric antibiotics - urine culture has thus far not proven to be helpful  Elevated INR with coagulopathy Felt to be due to sepsis - resolved - will discuss initiating anticoag (?IV heparin) w/ Vasc Surgery)  Hypokalemia and hypomagnesemia Recurrent - continue to replace and follow  Chronic pain has been on pain medications for 12 years - managed by Dr. [name] [facility] - also takes high dose scheduled klonopin - ordered chronic pain meds as  he takes at home with holding parameters  Code Status: FULL Family Communication: Spoke with significant other at bedside Disposition Plan: SDU  Consultants: Hand Surgery Vascular Surgery  ID  Procedures: none  Antibiotics: Zosyn 9/10 > 9/12 Vancomycin 9/10 >  DVT prophylaxis: SCDs   Objective: Blood pressure 115/74, pulse  119, temperature 98.3 F (36.8 C), temperature source Oral, resp. rate 13, height 6\' 3"  (1.905 m), weight 103.42 kg (228 lb), SpO2 99 %.  Intake/Output Summary (Last 24 hours) at 04/10/15 1420 Last data filed at 04/10/15 1133  Gross per 24 hour  Intake   1610 ml  Output    850 ml  Net    760 ml   Exam: General: No acute respiratory distress  Lungs: Clear to auscultation bilaterally without wheezes  Cardiovascular: Tachycardic but regular without appreciable murmur  Abdomen: Nontender, nondistended, soft, bowel sounds positive, no rebound, no ascites, no appreciable mass Extremities: The left hand is edematous though there is no erythema - the left hand is presently in a splint as applied by hand surgery - the right foot has a dusky appearance and is quite edematous as well with areas of punctate purpura - the right groin is tense with palpable induration w/o signif change   Data Reviewed: Basic Metabolic Panel:  Recent Labs Lab 04/07/15 0008 04/07/15 0113 04/07/15 0737 04/08/15 0319 04/09/15 0310 04/10/15 0247  NA 132*  --  132* 134* 136 137  K 2.9* 2.7* 3.4* 4.3 3.5 3.1*  CL 98*  --  105 107 110 110  CO2 17*  --  17* 16* 19* 19*  GLUCOSE 141*  --  109* 108* 136* 129*  BUN 56*  --  48* 52* 45* 29*  CREATININE 4.47*  --  3.30* 2.91* 2.33* 1.86*  CALCIUM 8.3*  --  7.6* 7.6* 7.6* 7.6*  MG  --  1.3*  --  2.0  --  1.5*    CBC:  Recent Labs Lab 04/07/15 0008 04/07/15 1610 04/08/15 0319 04/09/15 0310 04/10/15 0247  WBC 31.3*  --  22.6* 23.5* 28.6*  NEUTROABS 28.7*  --   --   --  26.1*  HGB 11.6*  --  9.4* 9.2* 8.4*  HCT 34.0*  --  27.4* 27.7* 25.5*  MCV 77.1*  --  78.3 79.1 80.2  PLT 255 230 249 280 296    Liver Function Tests:  Recent Labs Lab 04/07/15 0008 04/07/15 1610 04/08/15 0319 04/09/15 0310 04/10/15 0247  AST 34  --  24 34 37  ALT 21  --  21 22 23   ALKPHOS 129*  --  81 142* 145*  BILITOT 2.3* 2.0* 1.8* 1.5* 2.1*  PROT 6.8  --  5.2* 6.2* 5.5*    ALBUMIN 2.4*  --  1.6* 1.3* 1.2*    Coags:  Recent Labs Lab 04/07/15 0737 04/07/15 1610 04/08/15 0319 04/09/15 0310  INR 2.26* 1.93* 1.61* 1.39    Recent Labs Lab 04/07/15 0737 04/07/15 1610 04/09/15 0310  APTT 39* 37 34    Cardiac Enzymes:  Recent Labs Lab 04/07/15 0008 04/07/15 0113  CKTOTAL  --  495*  TROPONINI <0.03  --      Recent Results (from the past 240 hour(s))  Culture, blood (routine x 2)     Status: None   Collection Time: 04/06/15 11:50 PM  Result Value Ref Range Status   Specimen Description BLOOD RIGHT HAND  Final   Special Requests   Final    BOTTLES DRAWN AEROBIC AND  ANAEROBIC AER 10cc ANA 10cc   Culture  Setup Time   Final    GRAM POSITIVE COCCI IN CLUSTERS IN BOTH AEROBIC AND ANAEROBIC BOTTLES CORRECTED RESULTS CALLED TO: W DAVIS,RN AT 1151 04/08/15 BY L BENFIELD PREVIOUSLY REPORTED AS GRAM NEGATIVE COCCI AND GRAM VARIABLE COCCI    Culture   Final    STAPHYLOCOCCUS AUREUS SUSCEPTIBILITIES PERFORMED ON PREVIOUS CULTURE WITHIN THE LAST 5 DAYS. Performed at [facility]    Report Status 04/09/2015 FINAL  Final  Culture, blood (routine x 2)     Status: None   Collection Time: 04/07/15 12:05 AM  Result Value Ref Range Status   Specimen Description BLOOD RIGHT FOREARM  Final   Special Requests   Final    BOTTLES DRAWN AEROBIC AND ANAEROBIC AER 5cc ANA 5cc   Culture  Setup Time   Final    GRAM POSITIVE COCCI IN CLUSTERS IN BOTH AEROBIC AND ANAEROBIC BOTTLES CORRECTED RESULTS CALLED TO: W DAVIS,RN AT 1151 04/08/15 BY L BENFIELD PREVIOUSLY REPORTED AS GRAM NEGATIVE COCCI AND GRAM VARIABLE COCCI    Culture   Final    STAPHYLOCOCCUS AUREUS BACILLUS SPECIES Standardized susceptibility testing for this organism is not available. CRITICAL RESULT CALLED TO, READ BACK BY AND VERIFIED WITH: Hollace Kinnier AT 1318 04/09/15 BY L BENFIELD Performed at [facility]    Report Status 04/09/2015 FINAL  Final   Organism ID, Bacteria  STAPHYLOCOCCUS AUREUS  Final      Susceptibility   Staphylococcus aureus - MIC*    CIPROFLOXACIN <=0.5 SENSITIVE Sensitive     ERYTHROMYCIN <=0.25 SENSITIVE Sensitive     GENTAMICIN <=0.5 SENSITIVE Sensitive     OXACILLIN <=0.25 SENSITIVE Sensitive     TETRACYCLINE <=1 SENSITIVE Sensitive     VANCOMYCIN <=0.5 SENSITIVE Sensitive     TRIMETH/SULFA <=10 SENSITIVE Sensitive     CLINDAMYCIN <=0.25 SENSITIVE Sensitive     RIFAMPIN <=0.5 SENSITIVE Sensitive     Inducible Clindamycin NEGATIVE Sensitive     * STAPHYLOCOCCUS AUREUS  MRSA PCR Screening     Status: None   Collection Time: 04/07/15  6:30 AM  Result Value Ref Range Status   MRSA by PCR NEGATIVE NEGATIVE Final    Comment:        The GeneXpert MRSA Assay (FDA approved for NASAL specimens only), is one component of a comprehensive MRSA colonization surveillance program. It is not intended to diagnose MRSA infection nor to guide or monitor treatment for MRSA infections.   Urine culture     Status: None   Collection Time: 04/07/15  7:55 AM  Result Value Ref Range Status   Specimen Description URINE, RANDOM  Final   Special Requests NONE  Final   Culture NO GROWTH 1 DAY  Final   Report Status 04/08/2015 FINAL  Final  Culture, blood (routine x 2)     Status: None (Preliminary result)   Collection Time: 04/08/15  6:21 PM  Result Value Ref Range Status   Specimen Description BLOOD RIGHT ANTECUBITAL  Final   Special Requests BOTTLES DRAWN AEROBIC ONLY 4CC  Final   Culture NO GROWTH < 24 HOURS  Final   Report Status PENDING  Incomplete     Studies:   Recent x-ray studies have been reviewed in detail by the Attending Physician  Scheduled Meds:  Scheduled Meds: . docusate sodium  100 mg Oral BID  . feeding supplement (ENSURE ENLIVE)  237 mL Oral BID BM  . morphine  90 mg Oral  2 times per day  . naloxegol oxalate  25 mg Oral Daily  . sodium chloride  3 mL Intravenous Q12H  . Tdap  0.5 mL Intramuscular Once  .  vancomycin  1,250 mg Intravenous Q24H  . venlafaxine XR  37.5 mg Oral Q breakfast    Time spent on care of this patient: 35 mins   MCCLUNG,JEFFREY T , MD   [facility] Office  [phone] Pager - Text Page per Loretha Stapler as per below:  On-Call/Text Page:      [email or web address]      password [email or web address]  If 7PM-7AM, please contact night-coverage [email or web address] Password [email or web address] 04/10/2015, 2:20 PM   LOS: 3 days

## 2015-04-11 ENCOUNTER — Inpatient Hospital Stay (HOSPITAL_COMMUNITY): Payer: Medicaid Other | Admitting: Anesthesiology

## 2015-04-11 ENCOUNTER — Encounter (HOSPITAL_COMMUNITY)
Admission: EM | Disposition: A | Discharge status: Rehabilitation Facility | Payer: Self-pay | Source: Home / Self Care | Attending: Internal Medicine

## 2015-04-11 DIAGNOSIS — M79605 Pain in left leg: Secondary | ICD-10-CM

## 2015-04-11 DIAGNOSIS — I9789 Other postprocedural complications and disorders of the circulatory system, not elsewhere classified: Secondary | ICD-10-CM

## 2015-04-11 DIAGNOSIS — M79602 Pain in left arm: Secondary | ICD-10-CM

## 2015-04-11 DIAGNOSIS — M79603 Pain in arm, unspecified: Secondary | ICD-10-CM | POA: Diagnosis present

## 2015-04-11 HISTORY — PX: FEMORAL ARTERY EXPLORATION: SHX5160

## 2015-04-11 HISTORY — PX: INCISION AND DRAINAGE ABSCESS: SHX5864

## 2015-04-11 LAB — CBC
HEMATOCRIT: 26.1 % — AB (ref 39.0–52.0)
HEMOGLOBIN: 8.7 g/dL — AB (ref 13.0–17.0)
MCH: 26.9 pg (ref 26.0–34.0)
MCHC: 33.3 g/dL (ref 30.0–36.0)
MCV: 80.8 fL (ref 78.0–100.0)
Platelets: ADEQUATE 10*3/uL (ref 150–400)
RBC: 3.23 MIL/uL — ABNORMAL LOW (ref 4.22–5.81)
RDW: 17.3 % — ABNORMAL HIGH (ref 11.5–15.5)
WBC: 47.5 10*3/uL — AB (ref 4.0–10.5)

## 2015-04-11 LAB — COMPREHENSIVE METABOLIC PANEL
ALBUMIN: 1.2 g/dL — AB (ref 3.5–5.0)
ALK PHOS: 151 U/L — AB (ref 38–126)
ALT: 24 U/L (ref 17–63)
ANION GAP: 12 (ref 5–15)
AST: 41 U/L (ref 15–41)
BUN: 21 mg/dL — ABNORMAL HIGH (ref 6–20)
CALCIUM: 7.6 mg/dL — AB (ref 8.9–10.3)
CHLORIDE: 108 mmol/L (ref 101–111)
CO2: 17 mmol/L — AB (ref 22–32)
CREATININE: 1.63 mg/dL — AB (ref 0.61–1.24)
GFR calc Af Amer: 53 mL/min — ABNORMAL LOW (ref >60)
GFR calc non Af Amer: 46 mL/min — ABNORMAL LOW (ref >60)
GLUCOSE: 94 mg/dL (ref 65–99)
Potassium: 4.3 mmol/L (ref 3.5–5.1)
SODIUM: 137 mmol/L (ref 135–145)
Total Bilirubin: 1.5 mg/dL — ABNORMAL HIGH (ref 0.3–1.2)
Total Protein: 5.8 g/dL — ABNORMAL LOW (ref 6.5–8.1)

## 2015-04-11 LAB — POCT I-STAT 4, (NA,K, GLUC, HGB,HCT)
GLUCOSE: 97 mg/dL (ref 65–99)
Glucose, Bld: 109 mg/dL — ABNORMAL HIGH (ref 65–99)
HCT: 25 % — ABNORMAL LOW (ref 39.0–52.0)
HEMATOCRIT: 22 % — AB (ref 39.0–52.0)
HEMOGLOBIN: 7.5 g/dL — AB (ref 13.0–17.0)
Hemoglobin: 8.5 g/dL — ABNORMAL LOW (ref 13.0–17.0)
POTASSIUM: 4 mmol/L (ref 3.5–5.1)
POTASSIUM: 4.3 mmol/L (ref 3.5–5.1)
SODIUM: 138 mmol/L (ref 135–145)
Sodium: 140 mmol/L (ref 135–145)

## 2015-04-11 LAB — MAGNESIUM: Magnesium: 1.3 mg/dL — ABNORMAL LOW (ref 1.7–2.4)

## 2015-04-11 LAB — HEPARIN LEVEL (UNFRACTIONATED)

## 2015-04-11 LAB — ABO/RH: ABO/RH(D): A POS

## 2015-04-11 LAB — PREPARE RBC (CROSSMATCH)

## 2015-04-11 SURGERY — EXPLORATION, ARTERY, FEMORAL
Anesthesia: General | Site: Thigh | Laterality: Right

## 2015-04-11 MED ORDER — FENTANYL CITRATE (PF) 250 MCG/5ML IJ SOLN
INTRAMUSCULAR | Status: AC
  Filled 2015-04-11: qty 5

## 2015-04-11 MED ORDER — PROTAMINE SULFATE 10 MG/ML IV SOLN
INTRAVENOUS | Status: DC | PRN
Start: 2015-04-11 — End: 2015-04-11
  Administered 2015-04-11: 10 mg via INTRAVENOUS
  Administered 2015-04-11: 25 mg via INTRAVENOUS
  Administered 2015-04-11: 10 mg via INTRAVENOUS
  Administered 2015-04-11: 5 mg via INTRAVENOUS

## 2015-04-11 MED ORDER — PROPOFOL 10 MG/ML IV BOLUS
INTRAVENOUS | Status: AC
  Filled 2015-04-11: qty 20

## 2015-04-11 MED ORDER — MEPERIDINE HCL 25 MG/ML IJ SOLN: 6.2500 mg | INTRAMUSCULAR | Status: DC | PRN

## 2015-04-11 MED ORDER — SUCCINYLCHOLINE CHLORIDE 20 MG/ML IJ SOLN
INTRAMUSCULAR | Status: DC | PRN
  Administered 2015-04-11: 120 mg via INTRAVENOUS

## 2015-04-11 MED ORDER — LACTATED RINGERS IV SOLN
INTRAVENOUS | Status: DC | PRN
  Administered 2015-04-11 (×3): via INTRAVENOUS

## 2015-04-11 MED ORDER — PROTAMINE SULFATE 10 MG/ML IV SOLN
INTRAVENOUS | Status: AC
  Filled 2015-04-11: qty 5

## 2015-04-11 MED ORDER — EPHEDRINE SULFATE 50 MG/ML IJ SOLN
INTRAMUSCULAR | Status: DC | PRN
  Administered 2015-04-11: 10 mg via INTRAVENOUS

## 2015-04-11 MED ORDER — PROPOFOL 10 MG/ML IV BOLUS
INTRAVENOUS | Status: DC | PRN
  Administered 2015-04-11: 200 mg via INTRAVENOUS

## 2015-04-11 MED ORDER — SODIUM CHLORIDE 0.9 % IR SOLN
Status: DC | PRN
  Administered 2015-04-11: 500 mL

## 2015-04-11 MED ORDER — MIDAZOLAM HCL 2 MG/2ML IJ SOLN
INTRAMUSCULAR | Status: AC
  Filled 2015-04-11: qty 4

## 2015-04-11 MED ORDER — LACTATED RINGERS IV SOLN: INTRAVENOUS | Status: DC

## 2015-04-11 MED ORDER — FENTANYL CITRATE (PF) 100 MCG/2ML IJ SOLN
INTRAMUSCULAR | Status: DC | PRN
Start: 2015-04-11 — End: 2015-04-11
  Administered 2015-04-11 (×6): 50 ug via INTRAVENOUS

## 2015-04-11 MED ORDER — PHENYLEPHRINE 40 MCG/ML (10ML) SYRINGE FOR IV PUSH (FOR BLOOD PRESSURE SUPPORT)
PREFILLED_SYRINGE | INTRAVENOUS | Status: AC
  Filled 2015-04-11: qty 10

## 2015-04-11 MED ORDER — PROMETHAZINE HCL 25 MG/ML IJ SOLN: 6.2500 mg | INTRAMUSCULAR | Status: DC | PRN

## 2015-04-11 MED ORDER — LIDOCAINE HCL (CARDIAC) 20 MG/ML IV SOLN
INTRAVENOUS | Status: DC | PRN
  Administered 2015-04-11: 100 mg via INTRAVENOUS

## 2015-04-11 MED ORDER — MAGNESIUM SULFATE 50 % IJ SOLN
3.0000 g | Freq: Once | INTRAVENOUS | Status: AC
  Administered 2015-04-11: 3 g via INTRAVENOUS
  Filled 2015-04-11: qty 6

## 2015-04-11 MED ORDER — SODIUM CHLORIDE 0.9 % IV SOLN: Freq: Once | INTRAVENOUS | Status: DC

## 2015-04-11 MED ORDER — MIDAZOLAM HCL 2 MG/2ML IJ SOLN
INTRAMUSCULAR | Status: AC
  Administered 2015-04-11: 1 mg via INTRAVENOUS
  Filled 2015-04-11: qty 2

## 2015-04-11 MED ORDER — 0.9 % SODIUM CHLORIDE (POUR BTL) OPTIME
TOPICAL | Status: DC | PRN
  Administered 2015-04-11: 2000 mL

## 2015-04-11 MED ORDER — HEPARIN BOLUS VIA INFUSION
2500.0000 [IU] | Freq: Once | INTRAVENOUS | Status: AC
  Administered 2015-04-11: 2500 [IU] via INTRAVENOUS
  Filled 2015-04-11: qty 2500

## 2015-04-11 MED ORDER — THROMBIN 20000 UNITS EX SOLR
CUTANEOUS | Status: AC
  Filled 2015-04-11: qty 20000

## 2015-04-11 MED ORDER — HEPARIN SODIUM (PORCINE) 1000 UNIT/ML IJ SOLN
INTRAMUSCULAR | Status: DC | PRN
  Administered 2015-04-11: 10000 [IU] via INTRAVENOUS

## 2015-04-11 MED ORDER — ALBUMIN HUMAN 5 % IV SOLN
INTRAVENOUS | Status: DC | PRN
  Administered 2015-04-11 (×2): via INTRAVENOUS

## 2015-04-11 MED ORDER — HYDROMORPHONE HCL 1 MG/ML IJ SOLN: 0.2500 mg | INTRAMUSCULAR | Status: DC | PRN

## 2015-04-11 MED ORDER — ONDANSETRON HCL 4 MG/2ML IJ SOLN: 4.0000 mg | Freq: Four times a day (QID) | INTRAMUSCULAR | Status: DC

## 2015-04-11 MED ORDER — SODIUM CHLORIDE 0.9 % IV SOLN
INTRAVENOUS | Status: DC | PRN
  Administered 2015-04-11: 500 mL

## 2015-04-11 MED ORDER — ONDANSETRON HCL 4 MG/2ML IJ SOLN: 4.0000 mg | Freq: Four times a day (QID) | INTRAMUSCULAR | Status: DC | PRN

## 2015-04-11 MED ORDER — PHENYLEPHRINE HCL 10 MG/ML IJ SOLN
INTRAMUSCULAR | Status: AC
  Filled 2015-04-11: qty 2

## 2015-04-11 MED ORDER — PHENYLEPHRINE HCL 10 MG/ML IJ SOLN
20.0000 mg | INTRAVENOUS | Status: DC | PRN
  Administered 2015-04-11: 40 ug/min via INTRAVENOUS

## 2015-04-11 MED ORDER — ROCURONIUM BROMIDE 100 MG/10ML IV SOLN
INTRAVENOUS | Status: DC | PRN
  Administered 2015-04-11: 10 mg via INTRAVENOUS
  Administered 2015-04-11: 30 mg via INTRAVENOUS

## 2015-04-11 MED ORDER — CEFAZOLIN SODIUM-DEXTROSE 2-3 GM-% IV SOLR
INTRAVENOUS | Status: AC
  Filled 2015-04-11: qty 50

## 2015-04-11 MED ORDER — DEXMEDETOMIDINE HCL IN NACL 200 MCG/50ML IV SOLN
INTRAVENOUS | Status: AC
  Filled 2015-04-11: qty 50

## 2015-04-11 SURGICAL SUPPLY — 78 items
BANDAGE ELASTIC 4 VELCRO ST LF (GAUZE/BANDAGES/DRESSINGS) IMPLANT
BANDAGE ESMARK 6X9 LF (GAUZE/BANDAGES/DRESSINGS) IMPLANT
BNDG CMPR 9X6 STRL LF SNTH (GAUZE/BANDAGES/DRESSINGS)
BNDG ESMARK 6X9 LF (GAUZE/BANDAGES/DRESSINGS)
BNDG GAUZE ELAST 4 BULKY (GAUZE/BANDAGES/DRESSINGS) ×2 IMPLANT
CANISTER SUCTION 2500CC (MISCELLANEOUS) ×3 IMPLANT
CATH EMB 4FR 80CM (CATHETERS) ×2 IMPLANT
CLIP TI MEDIUM 24 (CLIP) ×3 IMPLANT
CLIP TI WIDE RED SMALL 24 (CLIP) ×3 IMPLANT
COVER PROBE W GEL 5X96 (DRAPES) ×3 IMPLANT
CUFF TOURNIQUET SINGLE 24IN (TOURNIQUET CUFF) IMPLANT
CUFF TOURNIQUET SINGLE 34IN LL (TOURNIQUET CUFF) IMPLANT
CUFF TOURNIQUET SINGLE 44IN (TOURNIQUET CUFF) IMPLANT
DRAIN CHANNEL 15F RND FF W/TCR (WOUND CARE) IMPLANT
DRAPE C-ARM 42X72 X-RAY (DRAPES) ×1 IMPLANT
DRSG COVADERM 4X10 (GAUZE/BANDAGES/DRESSINGS) IMPLANT
DRSG COVADERM 4X8 (GAUZE/BANDAGES/DRESSINGS) IMPLANT
DRSG TEGADERM 4X4.75 (GAUZE/BANDAGES/DRESSINGS) ×2 IMPLANT
ELECT BLADE 4.0 EZ CLEAN MEGAD (MISCELLANEOUS) ×3
ELECT REM PT RETURN 9FT ADLT (ELECTROSURGICAL) ×3
ELECTRODE BLDE 4.0 EZ CLN MEGD (MISCELLANEOUS) IMPLANT
ELECTRODE REM PT RTRN 9FT ADLT (ELECTROSURGICAL) ×1 IMPLANT
EVACUATOR SILICONE 100CC (DRAIN) IMPLANT
GLOVE BIO SURGEON STRL SZ7 (GLOVE) ×9 IMPLANT
GLOVE BIO SURGEON STRL SZ7.5 (GLOVE) ×2 IMPLANT
GLOVE BIOGEL PI IND STRL 6.5 (GLOVE) IMPLANT
GLOVE BIOGEL PI IND STRL 7.5 (GLOVE) ×1 IMPLANT
GLOVE BIOGEL PI IND STRL 8 (GLOVE) IMPLANT
GLOVE BIOGEL PI INDICATOR 6.5 (GLOVE) ×2
GLOVE BIOGEL PI INDICATOR 7.5 (GLOVE) ×2
GLOVE BIOGEL PI INDICATOR 8 (GLOVE) ×2
GLOVE ECLIPSE 7.0 STRL STRAW (GLOVE) ×2 IMPLANT
GLOVE SURG SS PI 7.0 STRL IVOR (GLOVE) ×2 IMPLANT
GOWN STRL REUS W/ TWL LRG LVL3 (GOWN DISPOSABLE) ×3 IMPLANT
GOWN STRL REUS W/TWL LRG LVL3 (GOWN DISPOSABLE) ×9
HANDPIECE INTERPULSE COAX TIP (DISPOSABLE) ×3
INSERT FOGARTY SM (MISCELLANEOUS) IMPLANT
KIT BASIN OR (CUSTOM PROCEDURE TRAY) ×3 IMPLANT
KIT ROOM TURNOVER OR (KITS) ×3 IMPLANT
LIQUID BAND (GAUZE/BANDAGES/DRESSINGS) ×2 IMPLANT
MARKER GRAFT CORONARY BYPASS (MISCELLANEOUS) IMPLANT
NS IRRIG 1000ML POUR BTL (IV SOLUTION) ×6 IMPLANT
PACK PERIPHERAL VASCULAR (CUSTOM PROCEDURE TRAY) ×3 IMPLANT
PAD ABD 8X10 STRL (GAUZE/BANDAGES/DRESSINGS) ×2 IMPLANT
PAD ARMBOARD 7.5X6 YLW CONV (MISCELLANEOUS) ×6 IMPLANT
PADDING CAST COTTON 6X4 STRL (CAST SUPPLIES) IMPLANT
PATCH VASC XENOSURE 1CMX6CM (Vascular Products) ×3 IMPLANT
PATCH VASC XENOSURE 1X6 (Vascular Products) ×1 IMPLANT
SET HNDPC FAN SPRY TIP SCT (DISPOSABLE) IMPLANT
SPONGE GAUZE 4X4 12PLY STER LF (GAUZE/BANDAGES/DRESSINGS) ×2 IMPLANT
SPONGE SURGIFOAM ABS GEL 100 (HEMOSTASIS) IMPLANT
STAPLER VISISTAT 35W (STAPLE) ×2 IMPLANT
STOPCOCK 4 WAY LG BORE MALE ST (IV SETS) ×2 IMPLANT
SUT ETHILON 3 0 PS 1 (SUTURE) IMPLANT
SUT GORETEX 5 0 TT13 24 (SUTURE) IMPLANT
SUT GORETEX 6.0 TT13 (SUTURE) IMPLANT
SUT MNCRL AB 4-0 PS2 18 (SUTURE) ×9 IMPLANT
SUT PROLENE 5 0 C 1 24 (SUTURE) ×5 IMPLANT
SUT PROLENE 5 0 C1 (SUTURE) ×2 IMPLANT
SUT PROLENE 6 0 BV (SUTURE) ×3 IMPLANT
SUT PROLENE 7 0 BV 1 (SUTURE) IMPLANT
SUT SILK 2 0 FS (SUTURE) ×3 IMPLANT
SUT SILK 3 0 (SUTURE)
SUT SILK 3-0 18XBRD TIE 12 (SUTURE) IMPLANT
SUT VIC AB 2-0 CT1 27 (SUTURE) ×6
SUT VIC AB 2-0 CT1 TAPERPNT 27 (SUTURE) ×2 IMPLANT
SUT VIC AB 3-0 SH 27 (SUTURE) ×9
SUT VIC AB 3-0 SH 27X BRD (SUTURE) ×3 IMPLANT
SWAB COLLECTION DEVICE MRSA (MISCELLANEOUS) ×2 IMPLANT
SYR 3ML LL SCALE MARK (SYRINGE) ×2 IMPLANT
TAPE CLOTH SURG 4X10 WHT LF (GAUZE/BANDAGES/DRESSINGS) ×6 IMPLANT
TRAY FOLEY W/METER SILVER 16FR (SET/KITS/TRAYS/PACK) ×3 IMPLANT
TUBE CONNECTING 12'X1/4 (SUCTIONS) ×1
TUBE CONNECTING 12X1/4 (SUCTIONS) ×1 IMPLANT
TUBING EXTENTION W/L.L. (IV SETS) ×3 IMPLANT
UNDERPAD 30X30 INCONTINENT (UNDERPADS AND DIAPERS) ×3 IMPLANT
WATER STERILE IRR 1000ML POUR (IV SOLUTION) ×3 IMPLANT
YANKAUER SUCT BULB TIP NO VENT (SUCTIONS) ×4 IMPLANT

## 2015-04-11 NOTE — Anesthesia Preprocedure Evaluation (Addendum)
Anesthesia Evaluation  Patient identified by MRN, date of birth, ID band Patient awake    Reviewed: Allergy & Precautions, H&P , NPO status , Patient's Chart, lab work & pertinent test results, reviewed documented beta blocker date and time   Airway Mallampati: II  TM Distance: >3 FB Neck ROM: Full    Dental no notable dental hx. (+) Teeth Intact, Dental Advisory Given   Pulmonary neg pulmonary ROS, former smoker,    Pulmonary exam normal breath sounds clear to auscultation       Cardiovascular hypertension, Pt. on home beta blockers and Pt. on medications + Peripheral Vascular Disease   Rhythm:Regular Rate:Normal  Echo 10/2013 - Left ventricle: The cavity size was normal. There was mildconcentric hypertrophy. Systolic function was normal. Theestimated ejection fraction was in the range of 50% to55%. Wall motion was normal; there were no regional wallmotion abnormalities. - Pulmonary arteries: Systolic pressure was mildly increased. PA peak pressure: 31mm Hg (S).    Neuro/Psych negative neurological ROS  negative psych ROS   GI/Hepatic Neg liver ROS, PUD,   Endo/Other  negative endocrine ROS  Renal/GU Renal disease     Musculoskeletal   Abdominal   Peds  Hematology negative hematology ROS (+)   Anesthesia Other Findings   Reproductive/Obstetrics negative OB ROS                           Anesthesia Physical  Anesthesia Plan  ASA: III and emergent  Anesthesia Plan: General   Post-op Pain Management:    Induction: Intravenous  Airway Management Planned: Oral ETT  Additional Equipment:   Intra-op Plan:   Post-operative Plan: Extubation in OR  Informed Consent: I have reviewed the patients History and Physical, chart, labs and discussed the procedure including the risks, benefits and alternatives for the proposed anesthesia with the patient or authorized representative who has  indicated his/her understanding and acceptance.   Dental advisory given and Dental Advisory Given  Plan Discussed with: CRNA and Anesthesiologist  Anesthesia Plan Comments: (2 x PIVs, +/- aline)       Anesthesia Quick Evaluation

## 2015-04-11 NOTE — Anesthesia Procedure Notes (Signed)
Procedure Name: Intubation Date/Time: 04/11/2015 12:49 PM Performed by: Orvilla Fus A Pre-anesthesia Checklist: Patient identified, Emergency Drugs available, Suction available, Patient being monitored and Timeout performed Patient Re-evaluated:Patient Re-evaluated prior to inductionOxygen Delivery Method: Circle system utilized Preoxygenation: Pre-oxygenation with 100% oxygen Intubation Type: IV induction Ventilation: Mask ventilation without difficulty and Oral airway inserted - appropriate to patient size Laryngoscope Size: Mac and 3 Grade View: Grade II Tube type: Oral Tube size: 8.0 mm Number of attempts: 1 Airway Equipment and Method: Stylet Placement Confirmation: ETT inserted through vocal cords under direct vision,  positive ETCO2 and breath sounds checked- equal and bilateral Secured at: 23 cm Tube secured with: Tape Dental Injury: Teeth and Oropharynx as per pre-operative assessment

## 2015-04-11 NOTE — Progress Notes (Signed)
Patient returned from PACU, VSS, pt awake, complaining of 0/10 pain. Wife at bedside. Right groin dressing intact, bilateral pedal and PT pulses dopplerable.

## 2015-04-11 NOTE — Transfer of Care (Signed)
Immediate Anesthesia Transfer of Care Note  Patient: [name]  Procedure(s) Performed: Procedure(s): Resection of infected right femoral artery aneurysm (Right) INCISION AND DRAINAGE OF RIGHT THIGH ABSCESS (Right)  Patient Location: PACU  Anesthesia Type:General  Level of Consciousness: awake, alert  and oriented  Airway & Oxygen Therapy: Patient Spontanous Breathing and Patient connected to nasal cannula oxygen  Post-op Assessment: Report given to RN and Post -op Vital signs reviewed and stable  Post vital signs: Reviewed and stable  Last Vitals:  Filed Vitals:   04/11/15 1528  BP:   Pulse:   Temp: 36.8 C  Resp:     Complications: No apparent anesthesia complications

## 2015-04-11 NOTE — Progress Notes (Signed)
ANTICOAGULATION CONSULT NOTE  Pharmacy Consult for Heparin Indication: suspected thromboembolism to R foot from R groin aneurysm  Allergies  Allergen Reactions  . Imitrex [Sumatriptan] Other (See Comments)    Chest pain, 2004 (tolerates 50 mg prn)  . Verapamil Other (See Comments)    Causes pvc's    Patient Measurements: Height:  (190.5 cm) Weight: 228 lb (103.42 kg) IBW/kg (Calculated) : 84.5 Heparin Dosing Weight: 91 kg  Vital Signs: Temp: 103 F (39.4 C) (09/14 2324) Temp Source: Rectal (09/14 2324) BP: 133/72 mmHg (09/14 2000) Pulse Rate: 124 (09/14 2000)  Labs:  Recent Labs  04/08/15 0319 04/09/15 0310 04/10/15 0247 04/10/15 2310  HGB 9.4* 9.2* 8.4*  --   HCT 27.4* 27.7* 25.5*  --   PLT 249 280 296  --   APTT  --  34  --   --   LABPROT 19.1* 17.2*  --   --   INR 1.61* 1.39  --   --   HEPARINUNFRC  --   --   --  <0.10*  CREATININE 2.91* 2.33* 1.86*  --     Estimated Creatinine Clearance: 57.8 mL/min (by C-G formula based on Cr of 1.86).   Medical History: Past Medical History  Diagnosis Date  . Hypertension   . Chronic knee pain   . Hyperlipemia   . PUD (peptic ulcer disease)    Assessment:   59 yr old male to begin IV heparin for suspected thromboembolism to R foot from R groin aneurysm, noted to be pulsatile, worriesome for possibly mycotic.  INR was 2.26 on 04/07/15, but had been taking Xarelto 10 mg daily, which he had at home from recent left knee revision. Xarelto had been stopped in May due to bleeding into knee per patient, but he had resumed taking Xarelto 10 mg daily 4 days prior to admission.   INR down to 1.39 pm  Received sq heparin x 3 doses 9/11-9/12.  Hgb down to 8.4, platelet count 296. ARF improving, creatinine down from 4.47 on 9/11 to 1.86 today.   Discussed briefly with Dr. Sharon Seller. For full-dose IV heparin, ok to bolus.  Initial HL is undetectable on heparin 1250 units/hr. Nurse reports no issues with infusion or  bleeding.  Goal of Therapy:  Heparin level 0.3-0.7 units/ml Monitor platelets by anticoagulation protocol: Yes   Plan:  Heparin 2500 units bolus then increase heparin to 1600 units/hr 6h HL Daily HL/CBC Monitor for any signs of bleeding  Arlean Hopping. Newman Pies, PharmD Clinical Pharmacist Pager [phone]  04/11/2015,12:18 AM

## 2015-04-11 NOTE — Op Note (Addendum)
OPERATIVE NOTE   PROCEDURE: 1. Right thigh abscess incision and drainage 2. 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 Edilia Bo; Lianne Cure, [facility]   ANESTHESIA: general  ESTIMATED BLOOD LOSS: 100 cc  FINDING(S): 1.  Intensive inflammation in right groin with extensive edema 2.  Intact right common femoral artery without any evidence of infection 3.  Ruptured right superficial femoral artery aneurysm 4.  Pockets of pus surrounding right superficial femoral artery aneurysm 5.  No distal bleeding 6.  Intact infected appearing proximal superficial femoral artery: successful ligation 7.  Dopplerable right posterior tibial artery   SPECIMEN(S):  Aerobic culture of right thigh abscess  INDICATIONS:   [name] is a 59 y.o. male who presents with urosepsis, hypotension, and right thigh mass.  Work-up of the right thigh mass was significant for a right femoral artery aneurysm.  The patient's kidney function was slowly recovering, so intervention on the femoral artery aneurysm was delayed.  Today on rounds, he was noted to be draining serosanguinous fluid from his right thigh.  Subsequently, I felt emergent thigh exploration and possible ligation of the femoral aneurysm was necessary.  The patient is aware the risk include but are not limited to: bleeding, infection, need for additional procedure due to repeat bleeding, limb loss, and continued embolization.  The patient elected to proceed.  DESCRIPTION: After obtaining full informed written consent, the patient was brought back to the operating room and placed supine upon the operating table.  The patient received IV antibiotics prior to induction.  After obtaining adequate anesthesia, the patient  was prepped and draped in the standard fashion for: right leg bypass and left leg vein harvest.  I made an incision over the right common femoral artery.  Using blunt dissection and electrocautery, I dissected out the right common femoral artery from the inguinal ligament to distal common femoral artery for ~5 cm length.  There was dense inflammatory changes in the subcutaneous tissue with extensive edema, so this dissection was difficult.  I limited the incision to avoid entering into the likely abscess cavity.  The common femoral artery in this incision was soft without any evidence of infection.  The patient was given 10000 units of Heparin intravenously, which was a therapeutic bolus. After waiting 3 minutes, I clamped the common femoral artery.  I packed the groin incision with a wet raytec.  I covered this incision with a Tegraderm.    I then made an incision over the likely thigh abscess.  Using electrocautery, I easily entered an abscess cavity anterior to the obviously ruptured right superficial femoral artery aneurysm.  I took an aerobic specimen of the abscess cavity and pus.  The anterior wall was already ruptured.  There was extensive hematoma and pus throughout.  I was able to eventually dissect out the aneurysmal wall.  The wall torn apart with limited traction.  I was able to identify a proximal superficial femoral artery which appeared infected by intact.  I oversewed this proximal superficial femoral artery with a double layer of 5-0 Prolene using bovine pericardial patch pledgets.  I tied the superficial femoral artery proximally with a 1 Tevdec tie.    At this point, I released the proximal clamp.  There was a little bleeding from a possible branch which was repaired with 5-0 Prolene.  I then washed out this groin with 500 cc of antibiotic solution with a Pulsavac followed by 3 L or sterile normal saline,  I then re-explored the left thigh wound.  There did not appear to be any active  bleeding.  I looked for the distal superficial femoral artery but could identify no distal superficial femoral artery.  I packed this wound with a wet Kerlix.  I will plan on changing this in the AM and bringing him back to the OR on Saturday morning to re-evaluate the artery and possibly close the subcutaneous tissue over the artery.  At this point, I doubt any tissue coverage would be successful due to the extensive necrosis of tissue in this abscess cavity.  I recovered this wound with a Tegraderm dressing.  I removed all the dressing from the right common femoral artery exposure.  There was no communication with the abscess cavity.  I washed out this groin.  There was no active bleeding.  I reapproximated this groin with a double layer of 2-0 Vicryl and a single layer of 3-0 Vicryl.  The skin was reapproximated with staples.   COMPLICATIONS: none  CONDITION: guarded   Leonides Sake, MD [facility] Office: [phone] Pager: [phone]  04/11/2015, 3:14 PM

## 2015-04-11 NOTE — Progress Notes (Addendum)
     Patient alert and oriented Right thigh dressing clean and dry   S/p PROCEDURE: 1. Right thigh abscess incision and drainage Excision of infected right superficial femoral artery aneurysm Pending interoperative cultures Currently on Vancomycin   Plan return to the OR for I & D right thigh wound 04/14/2015 by Dr. Amanda Cockayne, EMMA [facility] PA-C

## 2015-04-11 NOTE — Progress Notes (Signed)
   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  Will need: R thigh exploration, excision of infected femoral artery, possible bypass, and possible intraoperative angiogram.  Scheduling issues today due to limited number of surgeons   Subjective    Feeling sick  Objective Filed Vitals:   04/11/15 0229 04/11/15 0400 04/11/15 0411 04/11/15 0815  BP:  132/77    Pulse:  124    Temp: 101.2 F (38.4 C) 101.7 F (38.7 C) 102 F (38.9 C) 100.5 F (38.1 C)  TempSrc: Rectal Oral Rectal Rectal  Resp:  20    Height:      Weight:      SpO2:  95%      Intake/Output Summary (Last 24 hours) at 04/11/15 0851 Last data filed at 04/11/15 0422  Gross per 24 hour  Intake   2320 ml  Output   1900 ml  Net    420 ml   PULM  CTAB CV  RR, tachycardiac VASC  RLE: thigh more swollen, prior erythematous area looking like an abscess in process of decompressing, foot appears more viable with more pink in toes today, dopplerable pedal signal  Laboratory CBC    Component Value Date/Time   WBC 47.5* 04/11/2015 0426   HGB 8.7* 04/11/2015 0426   HCT 26.1* 04/11/2015 0426   PLT  04/11/2015 0426    PLATELET CLUMPS NOTED ON SMEAR, COUNT APPEARS ADEQUATE    BMET    Component Value Date/Time   NA 137 04/11/2015 0426   K 4.3 04/11/2015 0426   CL 108 04/11/2015 0426   CO2 17* 04/11/2015 0426   GLUCOSE 94 04/11/2015 0426   BUN 21* 04/11/2015 0426   CREATININE 1.63* 04/11/2015 0426   CALCIUM 7.6* 04/11/2015 0426   GFRNONAA 46* 04/11/2015 0426   GFRAA 53* 04/11/2015 0426    Leonides Sake, MD [facility] [place] Office: [phone] Pager: [phone]  04/11/2015, 8:51 AM

## 2015-04-11 NOTE — Progress Notes (Signed)
[place] TEAM 1 - Stepdown/ICU TEAM Progress Note  Craig BELSON ZOX:[id number] DOB: 13-Oct-1955 DOA: 04/06/2015 PCP: [name], MD  Admit HPI / Brief Narrative: 59 y.o.WM PMHx PUD, HLD, history of MRSA abscesses, peripheral neuropathy, and chronic pain   Presented to [facility] with severe left hand pain. He was found to be septic with acute kidney injury and left hand cellulitis. After admission he was noted to have a dusky swollen right foot. The patient reported that approximately 2 weeks prior he started feeling poorly and eating less. 9/7 he could not feel his right foot and fell as a result. He landed on his left wrist, shoulder, and back side. Since then his left hand has been very painful and his wrist and arm had begun to swell. He has developed thick erythematous streaking on his left arm.    In the ER: Lactic acid was 4.16, INR is 2.26 (not on anticoag), PTT is 24.7, WBC is 31.3, mag 1.3, potassium 2.7, procalcitonin 9.72. BP was 80/68 He received 3L of IV bolus in the ER. 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 Reportedly the patient injected his groin with testosterone 3 weeks ago.  HPI/Subjective: 9/15 A/O 4, S/P right thigh debridement. Patient has just returned from OR.    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 -Hold any nephrotoxic medications. -Continue hydration normal saline at 125 ml/hr  Left hand radius fracture/ cellulitis w/Hx MRSA abscess -[name], hand surgeon is directing care this issue,   Possible pyelonephritis -Creatinine improving with urinalysis   Elevated INR with coagulopathy -Resolving   Hypokalemia -WNL  Hypomagnesemia -Magnesium IV 3 gm  Chronic pain -been pain medications for 12 years - managed by Dr. [name] [facility]  -Takes high dose scheduled klonopin. Have ordered chronic pain meds as he takes at home with holding parameters.  -Klonopin 0.5 TID PRN.    Code Status: FULL Family Communication: Wife present at time of exam Disposition Plan: Per surgery    Consultants: Hand Surgery Dr.Brian L Chen Vascular Surgery  Dr. [name] (ID)  Procedure/Significant Events: 9/15 Right thigh abscess I&D, Excision of infected right superficial femoral artery aneurysm   Culture 9/10 blood right hand positive MSSA 9/11 blood right forearm positive MSSA 9/11 negative MRSA by PCR 9/11 urine negative 9/12 blood right AC NGTD   Antibiotics: Zosyn 9/10 > stopped 9/13 Vancomycin 9/10 > Ancef 9/14>>   Famciclovir 9/11>>   DVT prophylaxis: SCD   Devices    LINES / TUBES:      Continuous Infusions: . sodium chloride 125 mL/hr at 04/10/15 2354    Objective: VITAL SIGNS: Temp: 98.2 F (36.8 C) (09/15 1656) Temp Source: Oral (09/15 1656) BP: 101/52 mmHg (09/15 2003) Pulse Rate: 104 (09/15 1800) SPO2; FIO2:   Intake/Output Summary (Last 24 hours) at 04/11/15 2031 Last data filed at  04/11/15 1800  Gross per 24 hour  Intake 7078.41 ml  Output   1500 ml  Net 5578.41 ml     Exam: General: A/O  4, No acute respiratory distress Eyes: Negative headache, negative scleral hemorrhage ENT: Negative Runny nose, negative ear pain, negative gingival  bleeding, Neck:  Negative scars, masses, torticollis, lymphadenopathy, JVD Lungs: Clear to auscultation bilaterally without wheezes or crackles Cardiovascular: Regular rate and rhythm without murmur gallop or rub normal S1 and S2 Abdomen:negative abdominal pain, nondistended, positive soft, bowel sounds, no rebound, no ascites, no appreciable mass Extremities: right thigh/inguinal area swollen, erythematous, warm to touch, surgical area covered and clean. Right foot first and second metatarsal bluish in color, cool to touch, sensation remains intact, remainder of foot and leg 3-4+ edema. painful to the touch. Psychiatric:  Negative depression, negative anxiety, negative fatigue, negative mania  Neurologic:  Cranial nerves II through XII intact, tongue/uvula midline, all extremities muscle strength 5/5, sensation intact throughout,  negative dysarthria, negative expressive aphasia, negative receptive aphasia.   Data Reviewed: Basic Metabolic Panel:  Recent Labs Lab 04/07/15 0113 04/07/15 0737 04/08/15 0319 04/09/15 0310 04/10/15 0247 04/11/15 0426 04/11/15 1428 04/11/15 1459  NA  --  132* 134* 136 137 137 140 138  K 2.7* 3.4* 4.3 3.5 3.1* 4.3 4.0 4.3  CL  --  105 107 110 110 108  --   --   CO2  --  17* 16* 19* 19* 17*  --   --   GLUCOSE  --  109* 108* 136* 129* 94 97 109*  BUN  --  48* 52* 45* 29* 21*  --   --   CREATININE  --  3.30* 2.91* 2.33* 1.86* 1.63*  --   --   CALCIUM  --  7.6* 7.6* 7.6* 7.6* 7.6*  --   --   MG 1.3*  --  2.0  --  1.5* 1.3*  --   --    Liver Function Tests:  Recent Labs Lab 04/07/15 0008 04/07/15 1610 04/08/15 0319 04/09/15 0310 04/10/15 0247 04/11/15 0426  AST 34  --  24 34 37 41  ALT 21  --  ALKPHOS 129*  --  81 142* 145* 151*  BILITOT 2.3* 2.0* 1.8* 1.5* 2.1* 1.5*  PROT 6.8  --  5.2* 6.2* 5.5* 5.8*  ALBUMIN 2.4*  --  1.6* 1.3* 1.2* 1.2*   No results for input(s): LIPASE, AMYLASE in the last 168 hours. No results for input(s):  AMMONIA in the last 168 hours. CBC:  Recent Labs Lab 04/07/15 0008 04/07/15 1610 04/08/15 0319 04/09/15 0310 04/10/15 0247 04/11/15 0426 04/11/15 1428 04/11/15 1459  WBC 31.3*  --  22.6* 23.5* 28.6* 47.5*  --   --   NEUTROABS 28.7*  --   --   --  26.1*  --   --   --   HGB 11.6*  --  9.4* 9.2* 8.4* 8.7* 7.5* 8.5*  HCT 34.0*  --  27.4* 27.7* 25.5* 26.1* 22.0* 25.0*  MCV 77.1*  --  78.3 79.1 80.2 80.8  --   --   PLT 255 230 249 280 296 PLATELET CLUMPS NOTED ON SMEAR, COUNT APPEARS ADEQUATE  --   --    Cardiac Enzymes:  Recent Labs Lab 04/07/15 0008 04/07/15 0113  CKTOTAL  --  495*  TROPONINI <0.03  --    BNP (last 3 results) No results for input(s): BNP in the last 8760 hours.  ProBNP (last  3 results) No results for input(s): PROBNP in the last 8760 hours.  CBG: No results for input(s): GLUCAP in the last 168 hours.  Recent Results (from the past 240 hour(s))  Culture, blood (routine x 2)     Status: None   Collection Time: 04/06/15 11:50 PM  Result Value Ref Range Status   Specimen Description BLOOD RIGHT HAND  Final   Special Requests   Final    BOTTLES DRAWN AEROBIC AND ANAEROBIC AER 10cc ANA 10cc   Culture  Setup Time   Final    GRAM POSITIVE COCCI IN CLUSTERS IN BOTH AEROBIC AND ANAEROBIC BOTTLES CORRECTED RESULTS CALLED TO: W DAVIS,RN AT 1151 04/08/15 BY L BENFIELD PREVIOUSLY REPORTED AS GRAM NEGATIVE COCCI AND GRAM VARIABLE COCCI    Culture   Final    STAPHYLOCOCCUS AUREUS SUSCEPTIBILITIES PERFORMED ON PREVIOUS CULTURE WITHIN THE LAST 5 DAYS. Performed at [facility]    Report Status 04/09/2015 FINAL  Final  Culture, blood (routine x 2)     Status: None   Collection Time: 04/07/15 12:05 AM  Result Value Ref Range Status   Specimen Description BLOOD RIGHT FOREARM  Final   Special Requests   Final    BOTTLES DRAWN AEROBIC AND ANAEROBIC AER 5cc ANA 5cc   Culture  Setup Time   Final    GRAM POSITIVE COCCI IN CLUSTERS IN BOTH AEROBIC AND  ANAEROBIC BOTTLES CORRECTED RESULTS CALLED TO: W DAVIS,RN AT 1151 04/08/15 BY L BENFIELD PREVIOUSLY REPORTED AS GRAM NEGATIVE COCCI AND GRAM VARIABLE COCCI    Culture   Final    STAPHYLOCOCCUS AUREUS BACILLUS SPECIES Standardized susceptibility testing for this organism is not available. CRITICAL RESULT CALLED TO, READ BACK BY AND VERIFIED WITH: Hollace Kinnier AT 1318 04/09/15 BY L BENFIELD Performed at [facility]    Report Status 04/09/2015 FINAL  Final   Organism ID, Bacteria STAPHYLOCOCCUS AUREUS  Final      Susceptibility   Staphylococcus aureus - MIC*    CIPROFLOXACIN <=0.5 SENSITIVE Sensitive     ERYTHROMYCIN <=0.25 SENSITIVE Sensitive     GENTAMICIN <=0.5 SENSITIVE Sensitive     OXACILLIN <=0.25 SENSITIVE Sensitive     TETRACYCLINE <=1 SENSITIVE Sensitive     VANCOMYCIN <=0.5 SENSITIVE Sensitive     TRIMETH/SULFA <=10 SENSITIVE Sensitive     CLINDAMYCIN <=0.25 SENSITIVE Sensitive     RIFAMPIN <=0.5 SENSITIVE Sensitive     Inducible Clindamycin NEGATIVE Sensitive     * STAPHYLOCOCCUS AUREUS  MRSA PCR Screening     Status: None   Collection Time: 04/07/15  6:30 AM  Result Value Ref Range Status   MRSA by PCR NEGATIVE NEGATIVE Final    Comment:        The GeneXpert MRSA Assay (FDA approved for NASAL specimens only), is one component of a comprehensive MRSA colonization surveillance program. It is not intended to diagnose MRSA infection nor to guide or monitor treatment for MRSA infections.   Urine culture     Status: None   Collection Time: 04/07/15  7:55 AM  Result Value Ref Range Status   Specimen Description URINE, RANDOM  Final   Special Requests NONE  Final   Culture NO GROWTH 1 DAY  Final   Report Status 04/08/2015 FINAL  Final  Culture, blood (routine x 2)     Status: None (Preliminary result)   Collection Time: 04/08/15  6:21 PM  Result Value Ref Range Status   Specimen Description BLOOD RIGHT ANTECUBITAL  Final  Special Requests BOTTLES DRAWN  AEROBIC ONLY 4CC  Final   Culture NO GROWTH 3 DAYS  Final   Report Status PENDING  Incomplete     Studies:  Recent x-ray studies have been reviewed in detail by the Attending Physician  Scheduled Meds:  Scheduled Meds: . sodium chloride   Intravenous Once  . atenolol  12.5 mg Oral Daily  .  ceFAZolin (ANCEF) IV  2 g Intravenous 3 times per day  . docusate sodium  100 mg Oral BID  . feeding supplement (ENSURE ENLIVE)  237 mL Oral BID BM  . magnesium sulfate 1 - 4 g bolus IVPB  3 g Intravenous Once  . morphine  90 mg Oral 2 times per day  . naloxegol oxalate  25 mg Oral Daily  . sodium chloride  3 mL Intravenous Q12H  . Tdap  0.5 mL Intramuscular Once  . vancomycin  1,250 mg Intravenous Q24H  . venlafaxine XR  37.5 mg Oral Q breakfast    Time spent on care of this patient: 40 mins   WOODS, Roselind Messier , MD  [facility] Office  [phone] Pager [phone]  On-Call/Text Page:      [email or web address]      password [email or web address]  If 7PM-7AM, please contact night-coverage [email or web address] Password [email or web address] 04/11/2015, 8:31 PM   LOS: 4 days   Care during the described time interval was provided by me .  I have reviewed this patient's available data, including medical history, events of note, physical examination, and all test results as part of my evaluation. I have personally reviewed and interpreted all radiology studies.   Carolyne Littles, MD [phone] Pager

## 2015-04-12 ENCOUNTER — Encounter (HOSPITAL_COMMUNITY): Payer: Self-pay | Admitting: Vascular Surgery

## 2015-04-12 LAB — COMPREHENSIVE METABOLIC PANEL
ALT: 12 U/L — AB (ref 17–63)
ANION GAP: 8 (ref 5–15)
AST: 22 U/L (ref 15–41)
Albumin: 1.1 g/dL — ABNORMAL LOW (ref 3.5–5.0)
Alkaline Phosphatase: 92 U/L (ref 38–126)
BUN: 18 mg/dL (ref 6–20)
CHLORIDE: 107 mmol/L (ref 101–111)
CO2: 19 mmol/L — AB (ref 22–32)
CREATININE: 1.41 mg/dL — AB (ref 0.61–1.24)
Calcium: 6.8 mg/dL — ABNORMAL LOW (ref 8.9–10.3)
GFR calc Af Amer: >60 mL/min (ref >60)
GFR, EST NON AFRICAN AMERICAN: 54 mL/min — AB (ref >60)
Glucose, Bld: 131 mg/dL — ABNORMAL HIGH (ref 65–99)
POTASSIUM: 3.3 mmol/L — AB (ref 3.5–5.1)
SODIUM: 134 mmol/L — AB (ref 135–145)
Total Bilirubin: 0.9 mg/dL (ref 0.3–1.2)
Total Protein: 5 g/dL — ABNORMAL LOW (ref 6.5–8.1)

## 2015-04-12 LAB — CBC WITH DIFFERENTIAL/PLATELET
BASOS PCT: 0 %
Basophils Absolute: 0 10*3/uL (ref 0.0–0.1)
EOS PCT: 0 %
Eosinophils Absolute: 0 10*3/uL (ref 0.0–0.7)
HEMATOCRIT: 21.3 % — AB (ref 39.0–52.0)
HEMOGLOBIN: 7.3 g/dL — AB (ref 13.0–17.0)
LYMPHS PCT: 4 %
Lymphs Abs: 1.1 10*3/uL (ref 0.7–4.0)
MCH: 27.8 pg (ref 26.0–34.0)
MCHC: 34.3 g/dL (ref 30.0–36.0)
MCV: 81 fL (ref 78.0–100.0)
MONO ABS: 0.8 10*3/uL (ref 0.1–1.0)
MONOS PCT: 3 %
NEUTROS PCT: 93 %
Neutro Abs: 25 10*3/uL — ABNORMAL HIGH (ref 1.7–7.7)
Platelets: 277 10*3/uL (ref 150–400)
RBC: 2.63 MIL/uL — AB (ref 4.22–5.81)
RDW: 16.6 % — AB (ref 11.5–15.5)
WBC: 26.9 10*3/uL — AB (ref 4.0–10.5)

## 2015-04-12 LAB — HEPARIN LEVEL (UNFRACTIONATED)

## 2015-04-12 LAB — PROTIME-INR
INR: 1.44 (ref 0.00–1.49)
PROTHROMBIN TIME: 17.6 s — AB (ref 11.6–15.2)

## 2015-04-12 LAB — LACTIC ACID, PLASMA: LACTIC ACID, VENOUS: 1.2 mmol/L (ref 0.5–2.0)

## 2015-04-12 LAB — VANCOMYCIN, TROUGH: VANCOMYCIN TR: 7 ug/mL — AB (ref 10.0–20.0)

## 2015-04-12 LAB — MAGNESIUM: Magnesium: 1.6 mg/dL — ABNORMAL LOW (ref 1.7–2.4)

## 2015-04-12 LAB — PATHOLOGIST SMEAR REVIEW

## 2015-04-12 MED ORDER — NALOXONE HCL 0.4 MG/ML IJ SOLN
0.4000 mg | INTRAMUSCULAR | Status: DC | PRN
Start: 2015-04-12 — End: 2015-04-14

## 2015-04-12 MED ORDER — DIPHENHYDRAMINE HCL 50 MG/ML IJ SOLN: 12.5000 mg | Freq: Four times a day (QID) | INTRAMUSCULAR | Status: DC | PRN

## 2015-04-12 MED ORDER — MAGNESIUM SULFATE 2 GM/50ML IV SOLN
2.0000 g | Freq: Once | INTRAVENOUS | Status: AC
  Administered 2015-04-12: 2 g via INTRAVENOUS
  Filled 2015-04-12: qty 50

## 2015-04-12 MED ORDER — MORPHINE SULFATE (PF) 2 MG/ML IV SOLN
1.0000 mg | INTRAVENOUS | Status: DC | PRN
  Administered 2015-04-12: 3 mg via INTRAVENOUS
  Filled 2015-04-12: qty 2

## 2015-04-12 MED ORDER — ENOXAPARIN SODIUM 40 MG/0.4ML ~~LOC~~ SOLN
40.0000 mg | Freq: Every day | SUBCUTANEOUS | Status: DC
  Administered 2015-04-12: 40 mg via SUBCUTANEOUS
  Filled 2015-04-12 (×3): qty 0.4

## 2015-04-12 MED ORDER — MORPHINE SULFATE 25 MG/ML IV SOLN: 1.0000 mg/h | INTRAVENOUS | Status: DC | PRN

## 2015-04-12 MED ORDER — POTASSIUM CHLORIDE CRYS ER 20 MEQ PO TBCR
40.0000 meq | EXTENDED_RELEASE_TABLET | Freq: Once | ORAL | Status: AC
  Administered 2015-04-12: 40 meq via ORAL
  Filled 2015-04-12: qty 2

## 2015-04-12 MED ORDER — DIPHENHYDRAMINE HCL 12.5 MG/5ML PO ELIX
12.5000 mg | ORAL_SOLUTION | Freq: Four times a day (QID) | ORAL | Status: DC | PRN
Start: 2015-04-12 — End: 2015-04-14
  Filled 2015-04-12: qty 5

## 2015-04-12 MED ORDER — MORPHINE SULFATE 1 MG/ML IV SOLN
INTRAVENOUS | Status: DC
  Administered 2015-04-12: 21 mg via INTRAVENOUS
  Administered 2015-04-12 (×2): via INTRAVENOUS
  Administered 2015-04-12: 15 mg via INTRAVENOUS
  Administered 2015-04-12: 26.34 mg via INTRAVENOUS
  Administered 2015-04-13: 20:00:00 via INTRAVENOUS
  Administered 2015-04-13: 7.5 mg via INTRAVENOUS
  Administered 2015-04-13: 13.34 mg via INTRAVENOUS
  Administered 2015-04-13: 23.38 mg via INTRAVENOUS
  Administered 2015-04-13: 06:00:00 via INTRAVENOUS
  Administered 2015-04-13: 35.5 mg via INTRAVENOUS
  Administered 2015-04-14: 9 mg via INTRAVENOUS
  Administered 2015-04-14: 01:00:00 via INTRAVENOUS
  Administered 2015-04-14: 21.8 mg via INTRAVENOUS
  Administered 2015-04-14: 06:00:00 via INTRAVENOUS
  Administered 2015-04-14: 19.9 mg via INTRAVENOUS
  Administered 2015-04-14: 19.5 mg via INTRAVENOUS
  Filled 2015-04-12 (×9): qty 25

## 2015-04-12 MED ORDER — VANCOMYCIN HCL 10 G IV SOLR
1250.0000 mg | Freq: Two times a day (BID) | INTRAVENOUS | Status: DC
  Filled 2015-04-12: qty 1250

## 2015-04-12 MED ORDER — SODIUM CHLORIDE 0.9 % IJ SOLN
9.0000 mL | INTRAMUSCULAR | Status: DC | PRN
Start: 2015-04-12 — End: 2015-04-14

## 2015-04-12 NOTE — Progress Notes (Signed)
CCMD notified this nurse that pt was having an elevated HR, SVT of as high as 175. 12 lead obtained. Dr. Myra Gianotti and Claiborne Billings NP called and updated. No new orders. Will continue to monitor.

## 2015-04-12 NOTE — Progress Notes (Signed)
ANTIBIOTIC CONSULT NOTE   Pharmacy Consult for Vancomycin  Indication: MSSA sepsis  Allergies  Allergen Reactions  . Imitrex [Sumatriptan] Other (See Comments)    Chest pain, 2004 (tolerates 50 mg prn)  . Verapamil Other (See Comments)    Causes pvc's   Labs:  Recent Labs  04/10/15 0247 04/11/15 0426 04/11/15 1428 04/11/15 1459 04/12/15 0350  WBC 28.6* 47.5*  --   --  26.9*  HGB 8.4* 8.7* 7.5* 8.5* 7.3*  PLT 296 PLATELET CLUMPS NOTED ON SMEAR, COUNT APPEARS ADEQUATE  --   --  277  CREATININE 1.86* 1.63*  --   --  1.41*   Estimated Creatinine Clearance: 76.2 mL/min (by C-G formula based on Cr of 1.41).  Recent Labs  04/12/15 0815  VANCOTROUGH 7*     Microbiology: Recent Results (from the past 720 hour(s))  Culture, blood (routine x 2)     Status: None   Collection Time: 04/06/15 11:50 PM  Result Value Ref Range Status   Specimen Description BLOOD RIGHT HAND  Final   Special Requests   Final    BOTTLES DRAWN AEROBIC AND ANAEROBIC AER 10cc ANA 10cc   Culture  Setup Time   Final    GRAM POSITIVE COCCI IN CLUSTERS IN BOTH AEROBIC AND ANAEROBIC BOTTLES CORRECTED RESULTS CALLED TO: W DAVIS,RN AT 1151 04/08/15 BY L BENFIELD PREVIOUSLY REPORTED AS GRAM NEGATIVE COCCI AND GRAM VARIABLE COCCI    Culture   Final    STAPHYLOCOCCUS AUREUS SUSCEPTIBILITIES PERFORMED ON PREVIOUS CULTURE WITHIN THE LAST 5 DAYS. Performed at [facility]    Report Status 04/09/2015 FINAL  Final  Culture, blood (routine x 2)     Status: None   Collection Time: 04/07/15 12:05 AM  Result Value Ref Range Status   Specimen Description BLOOD RIGHT FOREARM  Final   Special Requests   Final    BOTTLES DRAWN AEROBIC AND ANAEROBIC AER 5cc ANA 5cc   Culture  Setup Time   Final    GRAM POSITIVE COCCI IN CLUSTERS IN BOTH AEROBIC AND ANAEROBIC BOTTLES CORRECTED RESULTS CALLED TO: W DAVIS,RN AT 1151 04/08/15 BY L BENFIELD PREVIOUSLY REPORTED AS GRAM NEGATIVE COCCI AND GRAM VARIABLE COCCI    Culture   Final    STAPHYLOCOCCUS AUREUS BACILLUS SPECIES Standardized susceptibility testing for this organism is not available. CRITICAL RESULT CALLED TO, READ BACK BY AND VERIFIED WITH: Hollace Kinnier AT 1318 04/09/15 BY L BENFIELD Performed at [facility]    Report Status 04/09/2015 FINAL  Final   Organism ID, Bacteria STAPHYLOCOCCUS AUREUS  Final      Susceptibility   Staphylococcus aureus - MIC*    CIPROFLOXACIN <=0.5 SENSITIVE Sensitive     ERYTHROMYCIN <=0.25 SENSITIVE Sensitive     GENTAMICIN <=0.5 SENSITIVE Sensitive     OXACILLIN <=0.25 SENSITIVE Sensitive     TETRACYCLINE <=1 SENSITIVE Sensitive     VANCOMYCIN <=0.5 SENSITIVE Sensitive     TRIMETH/SULFA <=10 SENSITIVE Sensitive     CLINDAMYCIN <=0.25 SENSITIVE Sensitive     RIFAMPIN <=0.5 SENSITIVE Sensitive     Inducible Clindamycin NEGATIVE Sensitive     * STAPHYLOCOCCUS AUREUS  MRSA PCR Screening     Status: None   Collection Time: 04/07/15  6:30 AM  Result Value Ref Range Status   MRSA by PCR NEGATIVE NEGATIVE Final    Comment:        The GeneXpert MRSA Assay (FDA approved for NASAL specimens only), is one component of a comprehensive MRSA  colonization surveillance program. It is not intended to diagnose MRSA infection nor to guide or monitor treatment for MRSA infections.   Urine culture     Status: None   Collection Time: 04/07/15  7:55 AM  Result Value Ref Range Status   Specimen Description URINE, RANDOM  Final   Special Requests NONE  Final   Culture NO GROWTH 1 DAY  Final   Report Status 04/08/2015 FINAL  Final  Culture, blood (routine x 2)     Status: None (Preliminary result)   Collection Time: 04/08/15  6:21 PM  Result Value Ref Range Status   Specimen Description BLOOD RIGHT ANTECUBITAL  Final   Special Requests BOTTLES DRAWN AEROBIC ONLY 4CC  Final   Culture NO GROWTH 3 DAYS  Final   Report Status PENDING  Incomplete  Culture, routine-abscess     Status: None (Preliminary result)    Collection Time: 04/11/15  1:54 PM  Result Value Ref Range Status   Specimen Description ABSCESS RIGHT THIGH  Final   Special Requests PATIENT ON FOLLOWING ANCEF VANCOMYCIN  Final   Gram Stain   Final    FEW WBC PRESENT, PREDOMINANTLY PMN NO SQUAMOUS EPITHELIAL CELLS SEEN RARE GRAM POSITIVE COCCI IN PAIRS IN CLUSTERS Performed at [facility]    Culture PENDING  Incomplete   Report Status PENDING  Incomplete     Assessment: 59 year old male continues on Vancomycin and Ancef for sepsis Vancomycin 9/11> Zosyn 9/11>9/13 Ancef 9/14>  9/10 2350 & 9/11 0005 - blood x 2 - Staph aureus in 2 of 2 cx - sens pending--> MSSA 9/12 BC x 2: NGTd 9/11 - urine - neg 9/11 MRSA screen neg 9/15 abscess cx> GPC  S/p I +D of right thigh abscess with intra-operative culture positive for GPC Tmax = 101.3  Scr has improved since admission Vancomycin trough low at 7 mcg / dL  Goal of Therapy:  Vancomycin trough level 15-20 mcg/ml  Resolution of infection  Plan:  Increase Vancomycin to 1250 mg iv Q 12 hours Follow up Scr, progress, fever trend, further culture results  Thank you Okey Regal, PharmD [phone] 04/12/2015,11:04 AM

## 2015-04-12 NOTE — Progress Notes (Addendum)
[facility] TEAM 1 - Stepdown/ICU TEAM PROGRESS NOTE  NOTNAMED [name]:[id number] DOB: 06/16/1956 DOA: 04/06/2015 PCP: [name], MD  Admit HPI / Brief Narrative: 59 y.o. Male with PUD, HLD, history of MRSA abscesses, peripheral neuropathy, and chronic pain who presented to [facility] with severe left hand pain. He was found to be septic with acute kidney injury and left hand cellulitis. After admission he was noted to have a dusky swollen right foot. The patient reported that approximately 2 weeks prior he started feeling poorly and eating less. 9/7 he could not feel his right foot and fell as a result. He landed on his left wrist, shoulder, and back side. Since then his left hand has been very painful and his wrist and arm had begun to swell. He has developed thick erythematous streaking on his left arm.    In the ER: Lactic acid was 4.16, INR is 2.26 (not on anticoag), PTT is 24.7, WBC is 31.3, mag 1.3, potassium 2.7, procalcitonin 9.72. BP was 80/68 He received 3L of IV bolus in the ER.  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 Reportedly the patient injected his groin with testosterone 3 weeks ago.  HPI/Subjective: The patient states his pain is currently reasonably well controlled with his PCA pump.  He denies chest pain nausea vomiting or abdominal pain.  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  - to follow-up in his office in approximately 2 weeks  Possible pyelonephritis Urinalysis suggestive of infection - adequately cover with broad-spectrum empiric antibiotics - urine culture not helpful   Elevated INR with coagulopathy Felt to be due to sepsis - resolved   Hypokalemia and hypomagnesemia Recurrent - continue to replace and follow  Chronic pain has been on pain medications for 12 years - managed by Dr. [name] [facility]?) - also takes high dose scheduled klonopin - pain currently controlled on PCA  Code Status: FULL Family Communication: No family present at time of exam today Disposition Plan: SDU  Consultants: Hand Surgery Vascular Surgery  ID  Procedures: none  Antibiotics: Zosyn 9/10 > 9/12 Vancomycin 9/10 > 9/15 Cefazolin 9/14 >  DVT prophylaxis: lovenox  Objective: Blood pressure 116/59, pulse 11, temperature 98.2 F (36.8 C), temperature source Oral, resp. rate 20, height 6\' 3"  (1.905 m), weight 103.42 kg (228 lb), SpO2 98 %.  Intake/Output Summary (Last 24 hours) at 04/12/15 1658 Last data filed at 04/12/15 1506  Gross per 24 hour  Intake   4146 ml  Output   1050  ml  Net   3096 ml   Exam: General: No acute respiratory distress  Lungs: Clear to auscultation bilaterally  Cardiovascular: Tachycardic but regular - no murmur  Abdomen: Nontender, nondistended, soft, bowel sounds positive, no rebound, no ascites, no appreciable mass Extremities: left hand in a splint - the right foot has a dusky appearance and is quite edematous  as well with areas of punctate purpura - the right groin is dressed and dry at present    Data Reviewed: Basic Metabolic Panel:  Recent Labs Lab 04/07/15 0113  04/08/15 0319 04/09/15 0310 04/10/15 0247 04/11/15 0426 04/11/15 1428 04/11/15 1459 04/12/15 0350  NA  --   < > 134* 136 137 137 140 138 134*  K 2.7*  < > 4.3 3.5 3.1* 4.3 4.0 4.3 3.3*  CL  --   < > 107 110 110 108  --   --  107  CO2  --   < > 16* 19* 19* 17*  --   --  19*  GLUCOSE  --   < > 108* 136* 129* 94 97 109* 131*  BUN  --   < > 52* 45* 29* 21*  --   --  18  CREATININE  --   < > 2.91* 2.33* 1.86* 1.63*  --   --  1.41*  CALCIUM  --   < > 7.6* 7.6* 7.6* 7.6*  --   --  6.8*  MG 1.3*  --  2.0  --  1.5* 1.3*  --   --  1.6*  < > = values in this interval not displayed.  CBC:  Recent Labs Lab 04/07/15 0008  04/08/15 0319 04/09/15 0310 04/10/15 0247 04/11/15 0426 04/11/15 1428 04/11/15 1459 04/12/15 0350  WBC 31.3*  --  22.6* 23.5* 28.6* 47.5*  --   --  26.9*  NEUTROABS 28.7*  --   --   --  26.1*  --   --   --  25.0*  HGB 11.6*  --  9.4* 9.2* 8.4* 8.7* 7.5* 8.5* 7.3*  HCT 34.0*  --  27.4* 27.7* 25.5* 26.1* 22.0* 25.0* 21.3*  MCV 77.1*  --  78.3 79.1 80.2 80.8  --   --  81.0  PLT 255  < > 249 280 296 PLATELET CLUMPS NOTED ON SMEAR, COUNT APPEARS ADEQUATE  --   --  277  < > = values in this interval not displayed.  Liver Function Tests:  Recent Labs Lab 04/08/15 0319 04/09/15 0310 04/10/15 0247 04/11/15 0426 04/12/15 0350  AST 24 34 37 41 22  ALT 21 22 23 24  12*  ALKPHOS 81 142* 145* 151* 92  BILITOT 1.8* 1.5* 2.1* 1.5* 0.9  PROT 5.2* 6.2* 5.5* 5.8* 5.0*  ALBUMIN 1.6* 1.3* 1.2* 1.2* 1.1*    Coags:  Recent Labs Lab 04/07/15 0737 04/07/15 1610 04/08/15 0319 04/09/15 0310 04/12/15 0350  INR 2.26* 1.93* 1.61* 1.39 1.44    Recent Labs Lab 04/07/15 0737 04/07/15 1610 04/09/15 0310  APTT 39* 37 34    Cardiac Enzymes:  Recent Labs Lab 04/07/15 0008 04/07/15 0113  CKTOTAL  --  495*    TROPONINI <0.03  --      Recent Results (from the past 240 hour(s))  Culture, blood (routine x 2)     Status: None   Collection Time: 04/06/15 11:50 PM  Result Value Ref Range Status   Specimen Description BLOOD RIGHT HAND  Final   Special Requests   Final  BOTTLES DRAWN AEROBIC AND ANAEROBIC AER 10cc ANA 10cc   Culture  Setup Time   Final    GRAM POSITIVE COCCI IN CLUSTERS IN BOTH AEROBIC AND ANAEROBIC BOTTLES CORRECTED RESULTS CALLED TO: W DAVIS,RN AT 1151 04/08/15 BY L BENFIELD PREVIOUSLY REPORTED AS GRAM NEGATIVE COCCI AND GRAM VARIABLE COCCI    Culture   Final    STAPHYLOCOCCUS AUREUS SUSCEPTIBILITIES PERFORMED ON PREVIOUS CULTURE WITHIN THE LAST 5 DAYS. Performed at [facility]    Report Status 04/09/2015 FINAL  Final  Culture, blood (routine x 2)     Status: None   Collection Time: 04/07/15 12:05 AM  Result Value Ref Range Status   Specimen Description BLOOD RIGHT FOREARM  Final   Special Requests   Final    BOTTLES DRAWN AEROBIC AND ANAEROBIC AER 5cc ANA 5cc   Culture  Setup Time   Final    GRAM POSITIVE COCCI IN CLUSTERS IN BOTH AEROBIC AND ANAEROBIC BOTTLES CORRECTED RESULTS CALLED TO: W DAVIS,RN AT 1151 04/08/15 BY L BENFIELD PREVIOUSLY REPORTED AS GRAM NEGATIVE COCCI AND GRAM VARIABLE COCCI    Culture   Final    STAPHYLOCOCCUS AUREUS BACILLUS SPECIES Standardized susceptibility testing for this organism is not available. CRITICAL RESULT CALLED TO, READ BACK BY AND VERIFIED WITH: Hollace Kinnier AT 1318 04/09/15 BY L BENFIELD Performed at [facility]    Report Status 04/09/2015 FINAL  Final   Organism ID, Bacteria STAPHYLOCOCCUS AUREUS  Final      Susceptibility   Staphylococcus aureus - MIC*    CIPROFLOXACIN <=0.5 SENSITIVE Sensitive     ERYTHROMYCIN <=0.25 SENSITIVE Sensitive     GENTAMICIN <=0.5 SENSITIVE Sensitive     OXACILLIN <=0.25 SENSITIVE Sensitive     TETRACYCLINE <=1 SENSITIVE Sensitive     VANCOMYCIN <=0.5 SENSITIVE Sensitive      TRIMETH/SULFA <=10 SENSITIVE Sensitive     CLINDAMYCIN <=0.25 SENSITIVE Sensitive     RIFAMPIN <=0.5 SENSITIVE Sensitive     Inducible Clindamycin NEGATIVE Sensitive     * STAPHYLOCOCCUS AUREUS  MRSA PCR Screening     Status: None   Collection Time: 04/07/15  6:30 AM  Result Value Ref Range Status   MRSA by PCR NEGATIVE NEGATIVE Final    Comment:        The GeneXpert MRSA Assay (FDA approved for NASAL specimens only), is one component of a comprehensive MRSA colonization surveillance program. It is not intended to diagnose MRSA infection nor to guide or monitor treatment for MRSA infections.   Urine culture     Status: None   Collection Time: 04/07/15  7:55 AM  Result Value Ref Range Status   Specimen Description URINE, RANDOM  Final   Special Requests NONE  Final   Culture NO GROWTH 1 DAY  Final   Report Status 04/08/2015 FINAL  Final  Culture, blood (routine x 2)     Status: None (Preliminary result)   Collection Time: 04/08/15  6:21 PM  Result Value Ref Range Status   Specimen Description BLOOD RIGHT ANTECUBITAL  Final   Special Requests BOTTLES DRAWN AEROBIC ONLY 4CC  Final   Culture NO GROWTH 4 DAYS  Final   Report Status PENDING  Incomplete  Culture, routine-abscess     Status: None (Preliminary result)   Collection Time: 04/11/15  1:54 PM  Result Value Ref Range Status   Specimen Description ABSCESS RIGHT THIGH  Final   Special Requests PATIENT ON FOLLOWING ANCEF VANCOMYCIN  Final   Gram Stain  Final    FEW WBC PRESENT, PREDOMINANTLY PMN NO SQUAMOUS EPITHELIAL CELLS SEEN RARE GRAM POSITIVE COCCI IN PAIRS IN CLUSTERS Performed at [facility]    Culture PENDING  Incomplete   Report Status PENDING  Incomplete     Studies:   Recent x-ray studies have been reviewed in detail by the Attending Physician  Scheduled Meds:  Scheduled Meds: . sodium chloride   Intravenous Once  . atenolol  12.5 mg Oral Daily  .  ceFAZolin (ANCEF) IV  2 g Intravenous 3  times per day  . docusate sodium  100 mg Oral BID  . enoxaparin (LOVENOX) injection  40 mg Subcutaneous Daily  . feeding supplement (ENSURE ENLIVE)  237 mL Oral BID BM  . morphine  90 mg Oral 2 times per day  . morphine   Intravenous 6 times per day  . naloxegol oxalate  25 mg Oral Daily  . sodium chloride  3 mL Intravenous Q12H  . Tdap  0.5 mL Intramuscular Once  . venlafaxine XR  37.5 mg Oral Q breakfast    Time spent on care of this patient: 35 mins   MCCLUNG,JEFFREY T , MD   [facility] Office  [phone] Pager - Text Page per Loretha Stapler as per below:  On-Call/Text Page:      [email or web address]      password [email or web address]  If 7PM-7AM, please contact night-coverage [email or web address] Password [email or web address] 04/12/2015, 4:58 PM   LOS: 5 days

## 2015-04-12 NOTE — Progress Notes (Addendum)
       S/p POD# 1 PROCEDURE: 1. Right thigh abscess incision and drainage Excision of infected right superficial femoral artery aneurysm  Patient has had increase in pain since surgery I will start full dose PCA for pain control. Thigh wound dressing change at bedside with Dr. Imogene Burn.  No active bleeding.  Plan Change wet to dry dressing later today.  Return to OR tomorrow for I & D and possible femoral artery repair.   Pending intraoperative cultures on emperical Vancomycin IV t max 101.3 Increased edema post-op elevation of LE encouraged Toes and plantar foot with ischemic changes  Cr 1.14, GFR >60, UO 100 to 400 cc per hr, UTI/pyelonephritis   COLLINS, EMMA MAUREEN PA-C  Addendum  I have independently interviewed and examined the patient, and I agree with the physician assistant's findings.  R thigh cleaner then previously.  No active bleeding, some venous blood evident.   Left leg swollen even prior to procedure.  Would check for DVT with venous duplex.  Pt not a candidate for anticoagulation given bleeding risk.  There is a residual R SFA stump that has been double sutured and tied but obvious is at high risk for rupture given its presence in the plegmon.  Will recheck the wound tomorrow and try to pull some tissue together to cover the SFA stump.  Leonides Sake, MD [facility] Office: [phone] Pager: [phone]  04/12/2015, 11:51 AM

## 2015-04-12 NOTE — Progress Notes (Signed)
This RN noted continuation of discoloration of right toes.  RN called rapid response nurse, and after assessment this RN notified MD of increasing edema in RLE and discoloration of toes.  No new orders received.  Will continue to monitor pt.

## 2015-04-12 NOTE — Anesthesia Postprocedure Evaluation (Signed)
Anesthesia Post Note  Patient: [name]  Procedure(s) Performed: Procedure(s) (LRB): Resection of infected right femoral artery aneurysm (Right) INCISION AND DRAINAGE OF RIGHT THIGH ABSCESS (Right)  Anesthesia type: General  Patient location: PACU  Post pain: Pain level controlled  Post assessment: Post-op Vital signs reviewed  Last Vitals: BP 115/82 mmHg  Pulse 104  Temp(Src) 37.3 C (Oral)  Resp 20  Ht  (1.905 m)  Wt 228 lb (103.42 kg)  BMI 28.50 kg/m2  SpO2 99%  Post vital signs: Reviewed  Level of consciousness: sedated  Complications: No apparent anesthesia complications

## 2015-04-12 NOTE — Progress Notes (Signed)
INFECTIOUS DISEASE PROGRESS NOTE  ID: [name] is a 59 y.o. male with  Principal Problem:   Severe sepsis Active Problems:   Chronic pain syndrome   History of duodenal ulcer   ARF (acute renal failure)   Cellulitis of left hand   Elevated INR   Foot swelling   Elevated bilirubin   Hypokalemia   Hypomagnesemia   Femoral artery aneurysm, right   UTI (lower urinary tract infection)   Bacteremia   Bacteremia due to Staphylococcus aureus   Acute renal failure syndrome   Fracture of left radius   Cellulitis   Arm pain, anterior  Subjective: No complaints, feels poorly  Abtx:  Anti-infectives    Start     Dose/Rate Route Frequency Ordered Stop   04/12/15 1130  vancomycin (VANCOCIN) 1,250 mg in sodium chloride 0.9 % 250 mL IVPB     1,250 mg 166.7 mL/hr over 90 Minutes Intravenous Every 12 hours 04/12/15 1101     04/11/15 1415  polymyxin B 500,000 Units, bacitracin 50,000 Units in sodium chloride irrigation 0.9 % 500 mL irrigation  Status:  Discontinued       As needed 04/11/15 1416 04/11/15 1522   04/10/15 2200  ceFAZolin (ANCEF) IVPB 2 g/50 mL premix     2 g 100 mL/hr over 30 Minutes Intravenous 3 times per day 04/10/15 1756     04/08/15 0900  vancomycin (VANCOCIN) 1,250 mg in sodium chloride 0.9 % 250 mL IVPB  Status:  Discontinued     1,250 mg 166.7 mL/hr over 90 Minutes Intravenous Every 24 hours 04/07/15 0835 04/12/15 1101   04/07/15 1100  piperacillin-tazobactam (ZOSYN) IVPB 3.375 g  Status:  Discontinued     3.375 g 12.5 mL/hr over 240 Minutes Intravenous 3 times per day 04/07/15 0834 04/09/15 1042   04/07/15 0845  vancomycin (VANCOCIN) 1,250 mg in sodium chloride 0.9 % 250 mL IVPB     1,250 mg 166.7 mL/hr over 90 Minutes Intravenous  Once 04/07/15 0834 04/07/15 1101   04/07/15 0730  piperacillin-tazobactam (ZOSYN) IVPB 3.375 g  Status:  Discontinued     3.375 g 100 mL/hr over 30 Minutes Intravenous  Once 04/07/15 0723 04/07/15 0727   04/07/15 0730  vancomycin  (VANCOCIN) IVPB 1000 mg/200 mL premix  Status:  Discontinued     1,000 mg 200 mL/hr over 60 Minutes Intravenous  Once 04/07/15 0723 04/07/15 0727   04/07/15 0724  famciclovir (FAMVIR) tablet 500 mg     500 mg Oral 2 times daily PRN 04/07/15 0729     04/07/15 0215  vancomycin (VANCOCIN) IVPB 1000 mg/200 mL premix     1,000 mg 200 mL/hr over 60 Minutes Intravenous  Once 04/07/15 0212 04/07/15 0311   04/07/15 0215  piperacillin-tazobactam (ZOSYN) IVPB 3.375 g     3.375 g 100 mL/hr over 30 Minutes Intravenous  Once 04/07/15 1610 04/07/15 0503      Medications:  Scheduled: . sodium chloride   Intravenous Once  . atenolol  12.5 mg Oral Daily  .  ceFAZolin (ANCEF) IV  2 g Intravenous 3 times per day  . docusate sodium  100 mg Oral BID  . enoxaparin (LOVENOX) injection  40 mg Subcutaneous Daily  . feeding supplement (ENSURE ENLIVE)  237 mL Oral BID BM  . morphine  90 mg Oral 2 times per day  . morphine   Intravenous 6 times per day  . naloxegol oxalate  25 mg Oral Daily  . sodium chloride  3  mL Intravenous Q12H  . Tdap  0.5 mL Intramuscular Once  . vancomycin  1,250 mg Intravenous Q12H  . venlafaxine XR  37.5 mg Oral Q breakfast    Objective: Vital signs in last 24 hours: Temp:  [97.7 F (36.5 C)-101.3 F (38.5 C)] 99.1 F (37.3 C) (09/16 0808) Pulse Rate:  [104-122] 104 (09/16 0808) Resp:  [11-20] 20 (09/16 0901) BP: (94-131)/(52-82) 115/82 mmHg (09/16 0808) SpO2:  [91 %-100 %] 99 % (09/16 0901) Arterial Line BP: (90-145)/(43-64) 98/44 mmHg (09/16 0808)   General appearance: alert, cooperative, fatigued and mild distress Resp: clear to auscultation bilaterally Cardio: regular rate and rhythm GI: normal findings: bowel sounds normal and soft, non-tender Extremities: R groin wound dressed, feet are warm, swollen.  Lab Results  Recent Labs  04/11/15 0426  04/11/15 1459 04/12/15 0350  WBC 47.5*  --   --  26.9*  HGB 8.7*  < > 8.5* 7.3*  HCT 26.1*  < > 25.0* 21.3*  NA  137  < > 138 134*  K 4.3  < > 4.3 3.3*  CL 108  --   --  107  CO2 17*  --   --  19*  BUN 21*  --   --  18  CREATININE 1.63*  --   --  1.41*  < > = values in this interval not displayed. Liver Panel  Recent Labs  04/11/15 0426 04/12/15 0350  PROT 5.8* 5.0*  ALBUMIN 1.2* 1.1*  AST 41 22  ALT 24 12*  ALKPHOS 151* 92  BILITOT 1.5* 0.9   Sedimentation Rate No results for input(s): ESRSEDRATE in the last 72 hours. C-Reactive Protein No results for input(s): CRP in the last 72 hours.  Microbiology: Recent Results (from the past 240 hour(s))  Culture, blood (routine x 2)     Status: None   Collection Time: 04/06/15 11:50 PM  Result Value Ref Range Status   Specimen Description BLOOD RIGHT HAND  Final   Special Requests   Final    BOTTLES DRAWN AEROBIC AND ANAEROBIC AER 10cc ANA 10cc   Culture  Setup Time   Final    GRAM POSITIVE COCCI IN CLUSTERS IN BOTH AEROBIC AND ANAEROBIC BOTTLES CORRECTED RESULTS CALLED TO: W DAVIS,RN AT 1151 04/08/15 BY L BENFIELD PREVIOUSLY REPORTED AS GRAM NEGATIVE COCCI AND GRAM VARIABLE COCCI    Culture   Final    STAPHYLOCOCCUS AUREUS SUSCEPTIBILITIES PERFORMED ON PREVIOUS CULTURE WITHIN THE LAST 5 DAYS. Performed at [facility]    Report Status 04/09/2015 FINAL  Final  Culture, blood (routine x 2)     Status: None   Collection Time: 04/07/15 12:05 AM  Result Value Ref Range Status   Specimen Description BLOOD RIGHT FOREARM  Final   Special Requests   Final    BOTTLES DRAWN AEROBIC AND ANAEROBIC AER 5cc ANA 5cc   Culture  Setup Time   Final    GRAM POSITIVE COCCI IN CLUSTERS IN BOTH AEROBIC AND ANAEROBIC BOTTLES CORRECTED RESULTS CALLED TO: W DAVIS,RN AT 1151 04/08/15 BY L BENFIELD PREVIOUSLY REPORTED AS GRAM NEGATIVE COCCI AND GRAM VARIABLE COCCI    Culture   Final    STAPHYLOCOCCUS AUREUS BACILLUS SPECIES Standardized susceptibility testing for this organism is not available. CRITICAL RESULT CALLED TO, READ BACK BY AND VERIFIED  WITH: Hollace Kinnier AT 1318 04/09/15 BY L BENFIELD Performed at [facility]    Report Status 04/09/2015 FINAL  Final   Organism ID, Bacteria STAPHYLOCOCCUS AUREUS  Final      Susceptibility   Staphylococcus aureus - MIC*    CIPROFLOXACIN <=0.5 SENSITIVE Sensitive     ERYTHROMYCIN <=0.25 SENSITIVE Sensitive     GENTAMICIN <=0.5 SENSITIVE Sensitive     OXACILLIN <=0.25 SENSITIVE Sensitive     TETRACYCLINE <=1 SENSITIVE Sensitive     VANCOMYCIN <=0.5 SENSITIVE Sensitive     TRIMETH/SULFA <=10 SENSITIVE Sensitive     CLINDAMYCIN <=0.25 SENSITIVE Sensitive     RIFAMPIN <=0.5 SENSITIVE Sensitive     Inducible Clindamycin NEGATIVE Sensitive     * STAPHYLOCOCCUS AUREUS  MRSA PCR Screening     Status: None   Collection Time: 04/07/15  6:30 AM  Result Value Ref Range Status   MRSA by PCR NEGATIVE NEGATIVE Final    Comment:        The GeneXpert MRSA Assay (FDA approved for NASAL specimens only), is one component of a comprehensive MRSA colonization surveillance program. It is not intended to diagnose MRSA infection nor to guide or monitor treatment for MRSA infections.   Urine culture     Status: None   Collection Time: 04/07/15  7:55 AM  Result Value Ref Range Status   Specimen Description URINE, RANDOM  Final   Special Requests NONE  Final   Culture NO GROWTH 1 DAY  Final   Report Status 04/08/2015 FINAL  Final  Culture, blood (routine x 2)     Status: None (Preliminary result)   Collection Time: 04/08/15  6:21 PM  Result Value Ref Range Status   Specimen Description BLOOD RIGHT ANTECUBITAL  Final   Special Requests BOTTLES DRAWN AEROBIC ONLY 4CC  Final   Culture NO GROWTH 4 DAYS  Final   Report Status PENDING  Incomplete  Culture, routine-abscess     Status: None (Preliminary result)   Collection Time: 04/11/15  1:54 PM  Result Value Ref Range Status   Specimen Description ABSCESS RIGHT THIGH  Final   Special Requests PATIENT ON FOLLOWING ANCEF VANCOMYCIN  Final    Gram Stain   Final    FEW WBC PRESENT, PREDOMINANTLY PMN NO SQUAMOUS EPITHELIAL CELLS SEEN RARE GRAM POSITIVE COCCI IN PAIRS IN CLUSTERS Performed at [facility]    Culture PENDING  Incomplete   Report Status PENDING  Incomplete    Studies/Results: No results found.   Assessment/Plan: Sepsis Staph bacteremia- MSSA 2/2 1/2 BCx+ Bacillus  Would check TEE when able Repeat BCx ngtd Will change to ancef alone as his wound Cx shows GPC   Wrist Fracture Appreciate ortho f/u  RLE aneurysm and thrombus  Acute Renal failure Watch with hydration, improving Primary to address his poassium  Therapeutic Drug monitoring F/u vanco level Follow Cr (better).   Total days of antibiotics: 3 vanco         [name] Infectious Diseases (pager) [phone] [email or web address] 04/12/2015, 11:52 AM  LOS: 5 days

## 2015-04-12 NOTE — Progress Notes (Signed)
     Right thigh dressing changed. No active bleeding SS drainage on dressing  COLLINS, EMMA MAUREEN PA-C

## 2015-04-12 NOTE — Progress Notes (Signed)
This RN noted increasing edema in pts scrotum and RLE.  Vital signs stable.  Pt reporting pain of 7/10.  RLE pulses dopplered with no changes noted. Notified MD and was given orders to elevate RLE above heart.  Will continue to monitor pt.

## 2015-04-12 NOTE — Progress Notes (Signed)
   VASCULAR SURGERY:  * Called this morning because his looked necrotic and asked to evaluate. Also called about leg swelling. On my exam, the foot does not look significantly different compared to yesterday. He does have seen areas of discoloration on his toes and on the plantar aspect of his foot and dorsum of his foot. It is felt this is likely embolic disease. As noted at the time of surgery the superficial femoral artery appeared to be chronically occluded. He does have significant leg swelling which was present yesterday also.  *  Given his bleeding risk, with a pulsatile mass in the right groin it does not appear that he was on Lovenox yesterday. We will restart Lovenox for DVT prophylaxis and also order a venous duplex scan although visualization may be very limited because of his size. We'll also write specific instructions on how to elevate his leg.  * fever: MAXIMUM TEMPERATURE is 101.3. He is currently on vancomycin. We'll defer any changes in anabiotics to primary service.  SUBJECTIVE: "once to get out of bed."  PHYSICAL EXAM: Filed Vitals:   04/12/15 0104 04/12/15 0200 04/12/15 0325 04/12/15 0518  BP: 101/59 113/53 116/58   Pulse:  116 121 122  Temp:   99.2 F (37.3 C) 101.3 F (38.5 C)  TempSrc:   Oral Oral  Resp: Height:      Weight:      SpO2: 91% 100% 98% 96%   Moderate to severe swelling of entire right lower extremity which was present yesterday. Discoloration of all toes and multiple spots on the plantar and dorsum of his foot. The foot appears adequately perfused.  LABS: Lab Results  Component Value Date   WBC 26.9* 04/12/2015   HGB 7.3* 04/12/2015   HCT 21.3* 04/12/2015   MCV 81.0 04/12/2015   PLT 277 04/12/2015   Lab Results  Component Value Date   CREATININE 1.41* 04/12/2015   Lab Results  Component Value Date   INR 1.44 04/12/2015   CBG (last 3)  No results for input(s): GLUCAP in the last 72 hours.  Principal Problem:   Severe  sepsis Active Problems:   Chronic pain syndrome   History of duodenal ulcer   ARF (acute renal failure)   Cellulitis of left hand   Elevated INR   Foot swelling   Elevated bilirubin   Hypokalemia   Hypomagnesemia   Femoral artery aneurysm, right   UTI (lower urinary tract infection)   Bacteremia   Bacteremia due to Staphylococcus aureus   Acute renal failure syndrome   Fracture of left radius   Cellulitis   Arm pain, anterior    Cari Caraway Beeper: [phone] 04/12/2015

## 2015-04-13 ENCOUNTER — Inpatient Hospital Stay (HOSPITAL_COMMUNITY): Payer: Medicaid Other | Admitting: Anesthesiology

## 2015-04-13 ENCOUNTER — Encounter (HOSPITAL_COMMUNITY)
Admission: EM | Disposition: A | Discharge status: Rehabilitation Facility | Payer: Self-pay | Source: Home / Self Care | Attending: Internal Medicine

## 2015-04-13 HISTORY — PX: INCISION AND DRAINAGE ABSCESS: SHX5864

## 2015-04-13 LAB — COMPREHENSIVE METABOLIC PANEL
ALK PHOS: 150 U/L — AB (ref 38–126)
ALT: 10 U/L — AB (ref 17–63)
AST: 27 U/L (ref 15–41)
Albumin: 1.1 g/dL — ABNORMAL LOW (ref 3.5–5.0)
Anion gap: 6 (ref 5–15)
BUN: 14 mg/dL (ref 6–20)
CALCIUM: 7 mg/dL — AB (ref 8.9–10.3)
CHLORIDE: 112 mmol/L — AB (ref 101–111)
CO2: 21 mmol/L — AB (ref 22–32)
CREATININE: 1.34 mg/dL — AB (ref 0.61–1.24)
GFR calc Af Amer: >60 mL/min (ref >60)
GFR, EST NON AFRICAN AMERICAN: 58 mL/min — AB (ref >60)
Glucose, Bld: 103 mg/dL — ABNORMAL HIGH (ref 65–99)
Potassium: 3.3 mmol/L — ABNORMAL LOW (ref 3.5–5.1)
Sodium: 139 mmol/L (ref 135–145)
Total Bilirubin: 1.1 mg/dL (ref 0.3–1.2)
Total Protein: 5.3 g/dL — ABNORMAL LOW (ref 6.5–8.1)

## 2015-04-13 LAB — CBC
HCT: 21 % — ABNORMAL LOW (ref 39.0–52.0)
HEMOGLOBIN: 7 g/dL — AB (ref 13.0–17.0)
MCH: 28 pg (ref 26.0–34.0)
MCHC: 33.3 g/dL (ref 30.0–36.0)
MCV: 84 fL (ref 78.0–100.0)
PLATELETS: 336 10*3/uL (ref 150–400)
RBC: 2.5 MIL/uL — AB (ref 4.22–5.81)
RDW: 17 % — ABNORMAL HIGH (ref 11.5–15.5)
WBC: 29.5 10*3/uL — AB (ref 4.0–10.5)

## 2015-04-13 LAB — CULTURE, BLOOD (ROUTINE X 2): CULTURE: NO GROWTH

## 2015-04-13 LAB — PREPARE RBC (CROSSMATCH)

## 2015-04-13 LAB — HEPARIN LEVEL (UNFRACTIONATED)

## 2015-04-13 SURGERY — INCISION AND DRAINAGE, ABSCESS
Site: Groin | Laterality: Right

## 2015-04-13 MED ORDER — ONDANSETRON HCL 4 MG/2ML IJ SOLN: 4.0000 mg | Freq: Once | INTRAMUSCULAR | Status: DC | PRN

## 2015-04-13 MED ORDER — PROPOFOL 10 MG/ML IV BOLUS
INTRAVENOUS | Status: DC | PRN
  Administered 2015-04-13: 160 mg via INTRAVENOUS

## 2015-04-13 MED ORDER — POTASSIUM CHLORIDE CRYS ER 20 MEQ PO TBCR
40.0000 meq | EXTENDED_RELEASE_TABLET | Freq: Every day | ORAL | Status: DC
  Administered 2015-04-13 – 2015-04-14 (×2): 40 meq via ORAL
  Filled 2015-04-13 (×2): qty 2

## 2015-04-13 MED ORDER — CEFAZOLIN SODIUM-DEXTROSE 2-3 GM-% IV SOLR
INTRAVENOUS | Status: AC
  Filled 2015-04-13: qty 50

## 2015-04-13 MED ORDER — MAGNESIUM SULFATE 2 GM/50ML IV SOLN
2.0000 g | Freq: Once | INTRAVENOUS | Status: AC
  Administered 2015-04-13: 2 g via INTRAVENOUS
  Filled 2015-04-13: qty 50

## 2015-04-13 MED ORDER — LIDOCAINE HCL (CARDIAC) 20 MG/ML IV SOLN
INTRAVENOUS | Status: DC | PRN
  Administered 2015-04-13: 40 mg via INTRAVENOUS

## 2015-04-13 MED ORDER — HYDROMORPHONE HCL 1 MG/ML IJ SOLN
INTRAMUSCULAR | Status: DC | PRN
  Administered 2015-04-13: 0.5 mg via INTRAVENOUS
  Administered 2015-04-13: .5 mg via INTRAVENOUS

## 2015-04-13 MED ORDER — FENTANYL CITRATE (PF) 250 MCG/5ML IJ SOLN
INTRAMUSCULAR | Status: AC
  Filled 2015-04-13: qty 5

## 2015-04-13 MED ORDER — LABETALOL HCL 5 MG/ML IV SOLN
INTRAVENOUS | Status: DC | PRN
  Administered 2015-04-13: 10 mg via INTRAVENOUS

## 2015-04-13 MED ORDER — SODIUM CHLORIDE 0.45 % IV SOLN
INTRAVENOUS | Status: DC
  Administered 2015-04-13 – 2015-04-15 (×3): via INTRAVENOUS

## 2015-04-13 MED ORDER — MIDAZOLAM HCL 5 MG/5ML IJ SOLN
INTRAMUSCULAR | Status: DC | PRN
Start: 2015-04-13 — End: 2015-04-13
  Administered 2015-04-13: 2 mg via INTRAVENOUS

## 2015-04-13 MED ORDER — LIDOCAINE HCL (CARDIAC) 20 MG/ML IV SOLN
INTRAVENOUS | Status: AC
  Filled 2015-04-13: qty 5

## 2015-04-13 MED ORDER — SUCCINYLCHOLINE CHLORIDE 20 MG/ML IJ SOLN
INTRAMUSCULAR | Status: DC | PRN
  Administered 2015-04-13: 120 mg via INTRAVENOUS

## 2015-04-13 MED ORDER — LACTATED RINGERS IV SOLN
INTRAVENOUS | Status: DC | PRN
  Administered 2015-04-13: 07:00:00 via INTRAVENOUS

## 2015-04-13 MED ORDER — SUCCINYLCHOLINE CHLORIDE 20 MG/ML IJ SOLN
INTRAMUSCULAR | Status: AC
  Filled 2015-04-13: qty 1

## 2015-04-13 MED ORDER — SODIUM CHLORIDE 0.9 % IR SOLN
Status: DC | PRN
  Administered 2015-04-13: 6000 mL

## 2015-04-13 MED ORDER — OXYCODONE HCL 5 MG/5ML PO SOLN
30.0000 mg | Freq: Once | ORAL | Status: DC | PRN
  Filled 2015-04-13: qty 30

## 2015-04-13 MED ORDER — PROPOFOL 10 MG/ML IV BOLUS
INTRAVENOUS | Status: AC
  Filled 2015-04-13: qty 20

## 2015-04-13 MED ORDER — HEPARIN (PORCINE) IN NACL 100-0.45 UNIT/ML-% IJ SOLN
3999.0000 [IU]/h | INTRAMUSCULAR | Status: AC
  Administered 2015-04-13: 1600 [IU]/h via INTRAVENOUS
  Administered 2015-04-14: 2300 [IU]/h via INTRAVENOUS
  Administered 2015-04-15 (×2): 2800 [IU]/h via INTRAVENOUS
  Administered 2015-04-16: 3300 [IU]/h via INTRAVENOUS
  Administered 2015-04-16: 3800 [IU]/h via INTRAVENOUS
  Administered 2015-04-16: 4000 [IU]/h via INTRAVENOUS
  Filled 2015-04-13 (×15): qty 250

## 2015-04-13 MED ORDER — PHENYLEPHRINE HCL 10 MG/ML IJ SOLN
10.0000 mg | INTRAVENOUS | Status: DC | PRN
  Administered 2015-04-13: 20 ug/min via INTRAVENOUS

## 2015-04-13 MED ORDER — 0.9 % SODIUM CHLORIDE (POUR BTL) OPTIME
TOPICAL | Status: DC | PRN
  Administered 2015-04-13: 1000 mL

## 2015-04-13 MED ORDER — ONDANSETRON HCL 4 MG/2ML IJ SOLN
INTRAMUSCULAR | Status: AC
  Filled 2015-04-13: qty 2

## 2015-04-13 MED ORDER — ROCURONIUM BROMIDE 50 MG/5ML IV SOLN
INTRAVENOUS | Status: AC
  Filled 2015-04-13: qty 1

## 2015-04-13 MED ORDER — SODIUM CHLORIDE 0.9 % IV BOLUS (SEPSIS)
1000.0000 mL | Freq: Once | INTRAVENOUS | Status: AC
  Administered 2015-04-13: 1000 mL via INTRAVENOUS

## 2015-04-13 MED ORDER — EPHEDRINE SULFATE 50 MG/ML IJ SOLN
INTRAMUSCULAR | Status: AC
  Filled 2015-04-13: qty 1

## 2015-04-13 MED ORDER — ONDANSETRON HCL 4 MG/2ML IJ SOLN
INTRAMUSCULAR | Status: DC | PRN
  Administered 2015-04-13: 4 mg via INTRAVENOUS

## 2015-04-13 MED ORDER — PHENYLEPHRINE 40 MCG/ML (10ML) SYRINGE FOR IV PUSH (FOR BLOOD PRESSURE SUPPORT)
PREFILLED_SYRINGE | INTRAVENOUS | Status: AC
  Filled 2015-04-13: qty 10

## 2015-04-13 MED ORDER — SODIUM CHLORIDE 0.9 % IJ SOLN
INTRAMUSCULAR | Status: AC
  Filled 2015-04-13: qty 10

## 2015-04-13 MED ORDER — MIDAZOLAM HCL 2 MG/2ML IJ SOLN
INTRAMUSCULAR | Status: AC
  Filled 2015-04-13: qty 4

## 2015-04-13 MED ORDER — HYDROMORPHONE HCL 1 MG/ML IJ SOLN
INTRAMUSCULAR | Status: AC
  Administered 2015-04-13: 1 mg via INTRAVENOUS
  Filled 2015-04-13: qty 2

## 2015-04-13 MED ORDER — SODIUM CHLORIDE 0.9 % IV SOLN: Freq: Once | INTRAVENOUS | Status: DC

## 2015-04-13 MED ORDER — HYDROMORPHONE HCL 1 MG/ML IJ SOLN
INTRAMUSCULAR | Status: AC
  Filled 2015-04-13: qty 1

## 2015-04-13 MED ORDER — OXYCODONE HCL 5 MG PO TABS: 30.0000 mg | ORAL_TABLET | Freq: Once | ORAL | Status: DC | PRN

## 2015-04-13 MED ORDER — LACTATED RINGERS IV SOLN
INTRAVENOUS | Status: DC | PRN
  Administered 2015-04-13: 08:00:00 via INTRAVENOUS

## 2015-04-13 MED ORDER — HYDROMORPHONE HCL 1 MG/ML IJ SOLN
1.0000 mg | INTRAMUSCULAR | Status: DC | PRN
  Administered 2015-04-13: 1 mg via INTRAVENOUS
  Administered 2015-04-13: 0.5 mg via INTRAVENOUS

## 2015-04-13 MED ORDER — FENTANYL CITRATE (PF) 100 MCG/2ML IJ SOLN
INTRAMUSCULAR | Status: DC | PRN
  Administered 2015-04-13 (×2): 100 ug via INTRAVENOUS
  Administered 2015-04-13: 200 ug via INTRAVENOUS
  Administered 2015-04-13: 100 ug via INTRAVENOUS

## 2015-04-13 SURGICAL SUPPLY — 65 items
BANDAGE ELASTIC 4 VELCRO ST LF (GAUZE/BANDAGES/DRESSINGS) IMPLANT
BANDAGE ESMARK 6X9 LF (GAUZE/BANDAGES/DRESSINGS) IMPLANT
BNDG CMPR 9X6 STRL LF SNTH (GAUZE/BANDAGES/DRESSINGS)
BNDG ESMARK 6X9 LF (GAUZE/BANDAGES/DRESSINGS)
CANISTER SUCTION 2500CC (MISCELLANEOUS) ×4 IMPLANT
CANISTER WOUND CARE 500ML ATS (WOUND CARE) ×4 IMPLANT
CLIP TI MEDIUM 24 (CLIP) ×4 IMPLANT
CLIP TI WIDE RED SMALL 24 (CLIP) ×4 IMPLANT
COVER PROBE W GEL 5X96 (DRAPES) IMPLANT
CUFF TOURNIQUET SINGLE 24IN (TOURNIQUET CUFF) IMPLANT
CUFF TOURNIQUET SINGLE 34IN LL (TOURNIQUET CUFF) IMPLANT
CUFF TOURNIQUET SINGLE 44IN (TOURNIQUET CUFF) IMPLANT
DRAIN CHANNEL 15F RND FF W/TCR (WOUND CARE) IMPLANT
DRAPE C-ARM 42X72 X-RAY (DRAPES) IMPLANT
DRSG COVADERM 4X10 (GAUZE/BANDAGES/DRESSINGS) IMPLANT
DRSG COVADERM 4X8 (GAUZE/BANDAGES/DRESSINGS) IMPLANT
DRSG VAC ATS MED SENSATRAC (GAUZE/BANDAGES/DRESSINGS) ×3 IMPLANT
DRSG VERSA FOAM LRG 10X15 (GAUZE/BANDAGES/DRESSINGS) ×3 IMPLANT
ELECT BLADE 4.0 EZ CLEAN MEGAD (MISCELLANEOUS) ×4
ELECT REM PT RETURN 9FT ADLT (ELECTROSURGICAL) ×4
ELECTRODE BLDE 4.0 EZ CLN MEGD (MISCELLANEOUS) ×1 IMPLANT
ELECTRODE REM PT RTRN 9FT ADLT (ELECTROSURGICAL) ×2 IMPLANT
EVACUATOR SILICONE 100CC (DRAIN) IMPLANT
GLOVE BIO SURGEON STRL SZ 6.5 (GLOVE) ×2 IMPLANT
GLOVE BIO SURGEON STRL SZ7 (GLOVE) ×4 IMPLANT
GLOVE BIO SURGEONS STRL SZ 6.5 (GLOVE) ×1
GLOVE BIOGEL PI IND STRL 6.5 (GLOVE) ×1 IMPLANT
GLOVE BIOGEL PI IND STRL 7.0 (GLOVE) ×1 IMPLANT
GLOVE BIOGEL PI IND STRL 7.5 (GLOVE) ×4 IMPLANT
GLOVE BIOGEL PI INDICATOR 6.5 (GLOVE) ×2
GLOVE BIOGEL PI INDICATOR 7.0 (GLOVE) ×2
GLOVE BIOGEL PI INDICATOR 7.5 (GLOVE) ×4
GOWN STRL REUS W/ TWL LRG LVL3 (GOWN DISPOSABLE) ×6 IMPLANT
GOWN STRL REUS W/TWL LRG LVL3 (GOWN DISPOSABLE) ×12
HANDPIECE INTERPULSE COAX TIP (DISPOSABLE) ×4
INSERT FOGARTY SM (MISCELLANEOUS) IMPLANT
KIT BASIN OR (CUSTOM PROCEDURE TRAY) ×4 IMPLANT
KIT ROOM TURNOVER OR (KITS) ×4 IMPLANT
MARKER GRAFT CORONARY BYPASS (MISCELLANEOUS) IMPLANT
NS IRRIG 1000ML POUR BTL (IV SOLUTION) ×8 IMPLANT
PACK PERIPHERAL VASCULAR (CUSTOM PROCEDURE TRAY) ×4 IMPLANT
PAD ARMBOARD 7.5X6 YLW CONV (MISCELLANEOUS) ×8 IMPLANT
PADDING CAST COTTON 6X4 STRL (CAST SUPPLIES) IMPLANT
SET HNDPC FAN SPRY TIP SCT (DISPOSABLE) ×1 IMPLANT
SPONGE SURGIFOAM ABS GEL 100 (HEMOSTASIS) IMPLANT
STAPLER VISISTAT 35W (STAPLE) IMPLANT
SUT ETHILON 3 0 PS 1 (SUTURE) IMPLANT
SUT GORETEX 5 0 TT13 24 (SUTURE) IMPLANT
SUT GORETEX 6.0 TT13 (SUTURE) IMPLANT
SUT MNCRL AB 4-0 PS2 18 (SUTURE) IMPLANT
SUT PROLENE 5 0 C 1 24 (SUTURE) ×7 IMPLANT
SUT PROLENE 6 0 BV (SUTURE) ×4 IMPLANT
SUT PROLENE 7 0 BV 1 (SUTURE) IMPLANT
SUT SILK 2 0 FS (SUTURE) ×1 IMPLANT
SUT SILK 3 0 (SUTURE)
SUT SILK 3-0 18XBRD TIE 12 (SUTURE) IMPLANT
SUT VIC AB 2-0 CT1 27 (SUTURE) ×4
SUT VIC AB 2-0 CT1 TAPERPNT 27 (SUTURE) ×2 IMPLANT
SUT VIC AB 3-0 SH 27 (SUTURE)
SUT VIC AB 3-0 SH 27X BRD (SUTURE) ×3 IMPLANT
TRAY FOLEY W/METER SILVER 16FR (SET/KITS/TRAYS/PACK) IMPLANT
TUBING EXTENTION W/L.L. (IV SETS) IMPLANT
UNDERPAD 30X30 INCONTINENT (UNDERPADS AND DIAPERS) ×4 IMPLANT
WATER STERILE IRR 1000ML POUR (IV SOLUTION) ×4 IMPLANT
YANKAUER SUCT BULB TIP NO VENT (SUCTIONS) ×3 IMPLANT

## 2015-04-13 NOTE — H&P (View-Only) (Signed)
[facility] Consult  Reason for Consult:  Right femoral aneurysm Referring Physician:  TRH MRN #:  [id number]  History of Present Illness: This is a 58 y.o. male who presented to the [facility] ED on 04/07/15 with complaints of left hand pain after sustaining a fall on 04/03/15. The patient believes he fell secondary to numbness and loss of feeling of his right foot likely. 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 He injects himself weekly with testosterone. On 04/03/15, he reports "being in a hurry" and injected himself while on the commode in his right thigh. He felt that he had stuck himself too high, but proceeded to finish the injection. He sticks himself in different spots every week and has admitted to using used needles in the past. He did use a clean needle for this injection. The patient denied any bleeding episodes after the injection. However once his started having paraesthesias of his right foot later that day, he felt that he a "blood clot" in his right leg. He started taking xarelto secondary to this belief and continued for 4 days prior to presenting to [facility]. He was formerly on xarelto s/p left knee revision. The xarelto was discontinued in May secondary to "bleeding into the knee."   Also at time of admission, the patient reported difficulty with voiding stating he had only voided twice in the previous 4 days.   He takes diclofenac twice daily for orthopedic pain in addition to other NSAIDs. He reports no known renal insuffiency. He does have a history of "kidney infections" but no nephrolithiasis. He does have past medical history of PUD, hyperlipidemia, hypertension and chronic knee pain (s/p bilateral knee replacements).   He reports feeling warm, but no chills. He denies dysuria. He denies any chest pain, chest discomfort or shortness of breath.   Past Medical History  Diagnosis Date  . Hypertension   . Chronic knee pain   .  Hyperlipemia   . PUD (peptic ulcer disease)    Past Surgical History  Procedure Laterality Date  . I&d extremity Left 07/12/2013    Procedure: I&D Left Thigh Abscess;  Surgeon: John [name], MD;  Location: [facility] OR;  Service: Orthopedics;  Laterality: Left;  . Replacement total knee bilateral    . Tonsillectomy    . Hernia repair    . Cholecystectomy    . Appendectomy    . Shoulder surgery    . Elbow surgery      Allergies  Allergen Reactions  . Imitrex [Sumatriptan] Other (See Comments)    Chest pain, 2004 (tolerates 50 mg prn)  . Verapamil Other (See Comments)    Causes pvc's    Prior to Admission medications   Medication Sig Start Date End Date Taking? Authorizing Provider  albuterol (PROAIR HFA) 108 (90 BASE) MCG/ACT inhaler Inhale 1 puff into the lungs every 6 (six) hours as needed for wheezing or shortness of breath.  09/06/13  Yes Historical Provider, MD  atenolol (TENORMIN) 100 MG tablet Take 100 mg by mouth daily.   Yes Historical Provider, MD  atorvastatin (LIPITOR) 80 MG tablet Take 80 mg by mouth daily.   Yes Historical Provider, MD  clonazePAM (KLONOPIN) 2 MG tablet Take 2 mg by mouth 4 (four) times daily.   Yes Historical Provider, MD  Diclofenac-Misoprostol 75-0.2 MG TBEC Take 1 tablet by mouth 2 (two) times daily. Arthrotec 02/21/15  Yes Historical Provider, MD  famciclovir (FAMVIR) 500 MG tablet Take 500 mg by   mouth 2 (two) times daily as needed (fever blisters).  10/25/13  Yes Historical Provider, MD  ibuprofen (ADVIL,MOTRIN) 800 MG tablet Take 1 tablet (800 mg total) by mouth 3 (three) times daily. Patient taking differently: Take 800 mg by mouth 2 (two) times daily as needed (pain).  02/25/15  Yes Stephen Balleh, MD  morphine (MS CONTIN) 30 MG 12 hr tablet Take 30 mg by mouth every 12 (twelve) hours. Take with a 60 mg tablet for a 90 mg dose 02/11/15  Yes Historical Provider, MD  morphine (MS CONTIN) 60 MG 12 hr tablet Take 60 mg by mouth every 12 (twelve) hours. Take with  a 30 mg tablet for a 90 mg dose 02/11/15  Yes Historical Provider, MD  naloxegol oxalate (MOVANTIK) 25 MG TABS tablet Take 25 mg by mouth 2 (two) times daily. 09/18/14  Yes Historical Provider, MD  ondansetron (ZOFRAN) 8 MG tablet Take 8 mg by mouth daily as needed for nausea or vomiting.  12/25/14  Yes Historical Provider, MD  OVER THE COUNTER MEDICATION Place 1 drop into both eyes 2 (two) times daily as needed (dry eyes). OTC lubricant eye drops   Yes Historical Provider, MD  oxycodone (ROXICODONE) 30 MG immediate release tablet Take 30 mg by mouth every 6 (six) hours. scheduled   Yes Historical Provider, MD  SUMAtriptan (IMITREX) 50 MG tablet Take 50 mg by mouth daily as needed for migraine or headache. Maximum 2 doses in 2 days 02/15/15  Yes Historical Provider, MD  testosterone cypionate (DEPOTESTOSTERONE CYPIONATE) 200 MG/ML injection Inject 400 mg into the muscle once a week. On Wednesday 03/04/15  Yes Historical Provider, MD  trifluridine (VIROPTIC) 1 % ophthalmic solution Place 1 drop into both eyes 2 (two) times daily as needed (breakouts (blurry, matted eyelids)).   Yes Historical Provider, MD  venlafaxine XR (EFFEXOR-XR) 37.5 MG 24 hr capsule Take 37.5 mg by mouth daily with breakfast.   Yes Historical Provider, MD  colchicine 0.6 MG tablet Take 1 tablet (0.6 mg total) by mouth 2 (two) times daily. Patient not taking: Reported on 04/07/2015 02/25/15   Stephen Balleh, MD  oxyCODONE-acetaminophen (PERCOCET/ROXICET) 5-325 MG per tablet Take 1 tablet by mouth every 6 (six) hours as needed for severe pain. Patient not taking: Reported on 04/07/2015 02/25/15   Stephen Balleh, MD    Social History   Social History  . Marital Status: Married    Spouse Name: N/A  . Number of Children: 3  . Years of Education: N/A   Occupational History  .      Sales   Social History Main Topics  . Smoking status: Former Smoker    Types: Cigarettes  . Smokeless tobacco: Not on file  . Alcohol Use: No  . Drug  Use: No  . Sexual Activity: Not on file   Other Topics Concern  . Not on file   Social History Narrative   Family History  Problem Relation Age of Onset  . Heart attack Paternal Grandmother 55  . Heart disease Mother 70    arrythmia  . Stroke Paternal Grandfather   . Stroke Maternal Grandfather     ROS: [x] Positive   [ ] Negative   [ ] All sytems reviewed and are negative  Cardiovascular: [] chest pain/pressure [] palpitations [] SOB lying flat [] DOE [] pain in legs while walking [] pain in legs at rest [] pain in legs at night [] non-healing ulcers [] hx of DVT [x] swelling in legs    Pulmonary: [] productive cough [] asthma/wheezing [] home O2  Neurologic: [x] weakness in [] arms [x] legs x[] numbness in [] arms [x] legs [] hx of CVA [] mini stroke []difficulty speaking or slurred speech [] temporary loss of vision in one eye [] dizziness  Hematologic: [] hx of cancer [] bleeding problems [] problems with blood clotting easily  Endocrine:   [] diabetes [] thyroid disease  GI [] vomiting blood [] blood in stool  GU: [] CKD/renal failure [] HD--[] M/W/F or [] T/T/S [] burning with urination [] blood in urine  Psychiatric: [] anxiety [] depression  Musculoskeletal: [] arthritis [x] joint pain  Integumentary: [] rashes [] ulcers  Constitutional: [x] fever [] chills   Physical Examination  Filed Vitals:   04/08/15 0700  BP: 96/58  Pulse: 119  Temp:   Resp: 14   Body mass index is 28.5 kg/(m^2).  General:  WDWN in NAD Gait: Not observed HENT: WNL, normocephalic Eyes: Pupils equal Pulmonary: normal non-labored breathing, without Rales, rhonchi,  wheezing Cardiac: tachycardic without  murmurs, rubs or gallops; without carotid bruits Abdomen: soft, NT/ND, no masses Vascular Exam/Pulses:  Right Left  Femoral Pulsatile femoral mass 2+ (normal)  Popliteal Non palpable nonpalpable  DP monophasic 2+   PT monophasic triphasic    Extremities: right groin with erythema, induration and pulsatile mass, bluish discoloration to several toes. Bilateral lower extremity swelling, right worse than left. Feet feel warm bilaterally.  Musculoskeletal: no muscle wasting or atrophy  Neurologic: A&O X 3; Appropriate Affect ; SENSATION: normal; MOTOR FUNCTION:  moving all extremities equally. Speech is fluent/normal Psychiatric: Judgment intact, Mood & affect appropriate for pt's clinical situation Lymph : No Cervical or Axillary; mild R Inguinal lymphadenopathy   Laboratory CBC    Component Value Date/Time   WBC 22.6* 04/08/2015 0319   RBC 3.50* 04/08/2015 0319   HGB 9.4* 04/08/2015 0319   HCT 27.4* 04/08/2015 0319   PLT 249 04/08/2015 0319   MCV 78.3 04/08/2015 0319   MCH 26.9 04/08/2015 0319   MCHC 34.3 04/08/2015 0319   RDW 16.9* 04/08/2015 0319   LYMPHSABS 1.3 04/07/2015 0008   MONOABS 1.3* 04/07/2015 0008   EOSABS 0.0 04/07/2015 0008   BASOSABS 0.0 04/07/2015 0008    BMET    Component Value Date/Time   NA 134* 04/08/2015 0319   K 4.3 04/08/2015 0319   CL 107 04/08/2015 0319   CO2 16* 04/08/2015 0319   GLUCOSE 108* 04/08/2015 0319   BUN 52* 04/08/2015 0319   CREATININE 2.91* 04/08/2015 0319   CALCIUM 7.6* 04/08/2015 0319   GFRNONAA 23* 04/08/2015 0319   GFRAA 26* 04/08/2015 0319    COAGS: Lab Results  Component Value Date   INR 1.61* 04/08/2015   INR 1.93* 04/07/2015   INR 2.26* 04/07/2015     Non-Invasive Vascular Imaging:    Vascular Ultrasound (04/07/15) Right Lower Extremity Arterial Duplex has been completed.  The right femoral artery appears to dilate from 0.96cm to 4.76cm in its proximal segment in the long axis, and in the transverse plane measures 6.3cm; this is in the area of the pulsatile palpable mass. This is suggestive of a possible aneurysm. Just distal to this area, the femoral artery appears to be occluded with internal echoes; suggestive of thrombus versus plaque versus unknown  etiology. There is mid to distal thigh collateral flow and reconstitution of the femoral artery in the distal segment with severely dampened monophasic waveforms. There is monophasic flow through to the   distal posterior tibial and dorsalis pedis arteries.  ABIs 04/07/15    RIGHT    LEFT    PRESSURE WAVEFORM  PRESSURE WAVEFORM  BRACHIAL 97 Triphasic BRACHIAL 114 Triphasic  DP 27 Severely dampened monophasic DP 119 Triphasic  AT   AT    PT 58 Dampened monophasic PT 113 Triphasic  PER   PER    GREAT TOE No discernable waveform NA GREAT TOE 52 NA    RIGHT LEFT  ABI 0.51 1.04        ASSESSMENT: This is a 58 y.o. 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 Other active problems: Sepsis, UTI, ARF coagulopathy, left radial fracture  PLAN:  -He likely has chronic right lower extremity peripheral arterial disease given his ABIs. Bluish discoloration of right toes likely secondary to thromboemboli for his femoral aneurysm. Would normally start anticoagulation but patient is already coagulopathic secondary to acute illness. -Motor and sensory intact with doppler flow of right foot. No urgent indication for surgery. Will need to have an arteriogram to evaluate his PAD and plan for right femoral aneurysm repair/bypass.  -Unfortunately current medical state including ARF and coagulopathy not suitable for performing arteriogram at this point.  -Blood cultures for gram positive cocci and gram negative cocci. Continue empiric antibiotics.  -Will follow along. Dr. Chesnie Capell to make further recommendations.   Kimberly Trinh, PA-C [facility]  Office: [phone] Pager: [phone]  Addendum  I have independently interviewed and examined the patient, and I agree with the physician assistant's findings.  Right leg demonstrates pulsatile mass in the proximal thigh with some erythema  at this site.  This patient reported injected this site with a clean sterile needle for his testosterone injection.  I would not have expected gram negative organisms from an injection, so I doubt the aneurysm is the source for this patient's sepsis, though mycotic aneurysm is always in the differential.  Patient's foot looks like he is trashing to his feet from the femoral aneurysm.  However, pt has evidence of collateral flow to the right foot as evident with an ABI of 0.51.   - Once medical stable and kidney recovered will need CTA abd/pelvis with bilateral leg runoff to screen for AAA, bilateral femoral aneurysms, and popliteal aneurysms.   CTA gives more accurate anatomic information on the aneurysm. - R leg angiography will also be needed to help with determination of reconstruction options.  Angiography is better for determining patency of lumens. - Pt currently auto-anticoagulated.  He will need anticoagulation once his sepsis/shock improves to help lysis of thromboembolism in his R foot.   - This patient is at risk of limb loss due to the thromboembolism. - I don't think he is stable enough to explore his right leg at this point.  The preop anatomic studies would help the operative planning also.   - BLE GSV mapping - CT R leg to evaluate R aneurysm for any infectious findings.  Dezaria Methot, MD [facility] Office: [phone] Pager: [phone]  04/08/2015, 10:14 AM  Addendum  I reviewed the R femur CT.  I don't see any obvious fluid collections consistent with an abscess.    Renesmay Nesbitt, MD [facility] Office: [phone] Pager: [phone]  04/08/2015, 12:51 PM   

## 2015-04-13 NOTE — Anesthesia Postprocedure Evaluation (Signed)
  Anesthesia Post-op Note  Patient: [name]  Procedure(s) Performed: Procedure(s) (LRB): INCISION AND DRAINAGE THIGH ABSCESS (Right)  Patient Location: PACU  Anesthesia Type: General  Level of Consciousness: awake and alert   Airway and Oxygen Therapy: Patient Spontanous Breathing  Post-op Pain: mild  Post-op Assessment: Post-op Vital signs reviewed, Patient's Cardiovascular Status Stable, Respiratory Function Stable, Patent Airway and No signs of Nausea or vomiting  Last Vitals:  Filed Vitals:   04/13/15 0339  BP: 111/72  Pulse: 109  Temp: 37.7 C  Resp: 13    Post-op Vital Signs: stable, baseline tachycardia of 115 persists, BP stable, daily am dose of BB supplemented with labetalol   Complications: No apparent anesthesia complications

## 2015-04-13 NOTE — Interval H&P Note (Signed)
History and Physical Interval Note:  04/13/2015 7:15 AM  [name]  has presented today for surgery, with the diagnosis of mycotic femoral aneurysm   The various methods of treatment have been discussed with the patient and family. After consideration of risks, benefits and other options for treatment, the patient has consented to  Procedure(s): RE-EXPLORE RIGHT THIGH WOUND (Right) as a surgical intervention .  The patient's history has been reviewed, patient examined, no change in status, stable for surgery.  I have reviewed the patient's chart and labs.  Questions were answered to the patient's satisfaction.     Leonides Sake

## 2015-04-13 NOTE — Progress Notes (Signed)
   Daily Progress Note  Washout of right thigh abscess cavity revealed additional septae which lead to a medial accumulation of infected hematoma.  Additionally, the proximal superficial femoral artery remains intact.  The distal superficial femoral artery was found today and double suture ligated.  The femoral vein was found at the base of this cavity.  This vein will be at higher risk of clotting.  Given the risk of deep vein thrombosis, I think this patient will need to be started on anticoagulation, which will help his distal foot thromboembolism.  Obviously the patient is at risk for bleeding also.  At this point, I think the DVT/PE risk exceeds the bleeding risk.  The wound was managed with white (dense) sponge deep and regular sponge throughout the rest of the wound.  If patient develops frankly bloody drainage, please contact Dr. Myra Gianotti over the weekend.  - pt will go back to the OR on Monday for additional washout/VAC placement  Leonides Sake, MD [facility] [place] Office: [phone] Pager: [phone]  04/13/2015, 9:38 AM

## 2015-04-13 NOTE — Progress Notes (Addendum)
ANTICOAGULATION CONSULT NOTE - Follow Up Consult  Pharmacy Consult for Heparin  Indication: Thromboembolism to Right Foot  Allergies  Allergen Reactions  . Imitrex [Sumatriptan] Other (See Comments)    Chest pain, 2004 (tolerates 50 mg prn)  . Verapamil Other (See Comments)    Causes pvc's    Patient Measurements: Height:  (190.5 cm) Weight: 228 lb (103.42 kg) IBW/kg (Calculated) : 84.5 Heparin dosing weight: 103 kg  Vital Signs: Temp: 99.5 F (37.5 C) (09/17 1125) Temp Source: Oral (09/17 1125) BP: 86/49 mmHg (09/17 1125) Pulse Rate: 113 (09/17 1125)  Labs:  Recent Labs  04/10/15 2310  04/11/15 0426 04/11/15 0938 04/11/15 1428 04/11/15 1459 04/12/15 0350  HGB  --   < > 8.7*  --  7.5* 8.5* 7.3*  HCT  --   < > 26.1*  --  22.0* 25.0* 21.3*  PLT  --   --  PLATELET CLUMPS NOTED ON SMEAR, COUNT APPEARS ADEQUATE  --   --   --  277  LABPROT  --   --   --   --   --   --  17.6*  INR  --   --   --   --   --   --  1.44  HEPARINUNFRC <0.10*  --   --  <0.10*  --   --  <0.10*  CREATININE  --   --  1.63*  --   --   --  1.41*  < > = values in this interval not displayed.  Estimated Creatinine Clearance: 76.2 mL/min (by C-G formula based on Cr of 1.41).   Medications:  Scheduled:  . sodium chloride   Intravenous Once  . atenolol  12.5 mg Oral Daily  .  ceFAZolin (ANCEF) IV  2 g Intravenous 3 times per day  . docusate sodium  100 mg Oral BID  . enoxaparin (LOVENOX) injection  40 mg Subcutaneous Daily  . feeding supplement (ENSURE ENLIVE)  237 mL Oral BID BM  . morphine  90 mg Oral 2 times per day  . morphine   Intravenous 6 times per day  . naloxegol oxalate  25 mg Oral Daily  . ondansetron      . sodium chloride  1,000 mL Intravenous Once  . sodium chloride  3 mL Intravenous Q12H  . Tdap  0.5 mL Intramuscular Once  . venlafaxine XR  37.5 mg Oral Q breakfast   Infusions:  . 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  Pharmacy consulted for tx dose heparin for right foot thromboembolism secondary to R groin aneurysm.  INR was 2.26 on 04/07/15, but had been taking Xarelto 10 mg daily, which he had at home from recent left knee revision. Xarelto had been stopped in May due to bleeding into knee per patient, but he had resumed taking Xarelto 10 mg daily 4 days prior to admission. 9/14: IV heparin started for suspected thromboembolism 9/16 IV heparin stopped, Lovenox for DVT ppx started (last dose 9/16 1351).  Per vasc surgery note: Washout of right thigh abscess cavity revealed additional septae which lead to a medial accumulation of infected hematoma. Additionally, the proximal superficial femoral artery remains intact.  The vein will be at higher risk of clotting.  MD believes DVT/PE risk exceeds the bleeding risk.   Hgb low 7.3, Plts 277.  Due to bleeding in wound vac as noted by nursing will not bolus patient.    Goal of Therapy:  Heparin level 0.3-0.7 units/ml Monitor platelets by anticoagulation protocol: Yes   Plan:  D/C Lovenox. Initiate Heparin 1600 units/hr 6 hr HL Daily CBC/HL Monitor for s/sx of bleeding   Hazle Nordmann, PharmD Pharmacy Resident [phone]   Addendum -HL undetectable, increase rate to 1900 units/hr -Check level with AM labs -No issues with line/infusion   Baldemar Friday  04/13/2015 8:51 PM  ADDN: HL undetectable on 1900 units/hr. Nurse reports no issues with infusion, IV access or bleeding.  Plan:  Bolus heparin 3000 units and increase rate to 2300 units/hr 6h HL  Arlean Hopping. Newman Pies, PharmD Clinical Pharmacist Pager [phone]

## 2015-04-13 NOTE — Progress Notes (Addendum)
Patient returned from OR. Patient was hypotensive at 86/49. Dr. Myra Gianotti notifed, new orders placed. Nurse will continue to monitor.

## 2015-04-13 NOTE — Transfer of Care (Signed)
Immediate Anesthesia Transfer of Care Note  Patient: [name]  Procedure(s) Performed: Procedure(s): INCISION AND DRAINAGE THIGH ABSCESS (Right)  Patient Location: PACU  Anesthesia Type:General  Level of Consciousness: awake, alert , oriented and patient cooperative  Airway & Oxygen Therapy: Patient Spontanous Breathing and Patient connected to face mask oxygen  Post-op Assessment: Report given to RN, Post -op Vital signs reviewed and stable, Patient moving all extremities and Patient moving all extremities X 4  Post vital signs: Reviewed and stable  Last Vitals:  Filed Vitals:   04/13/15 0339  BP: 111/72  Pulse: 109  Temp: 37.7 C  Resp: 13    Complications: No apparent anesthesia complications

## 2015-04-13 NOTE — Progress Notes (Signed)
[place] TEAM 1 - Stepdown/ICU TEAM PROGRESS NOTE  [name] ZOX:[id number] DOB: 04-Jun-1956 DOA: 04/06/2015 PCP: [name], MD  Admit HPI / Brief Narrative: 58 y.o. Male with PUD, HLD, history of MRSA abscesses, peripheral neuropathy, and chronic pain who presented to [facility] with severe left hand pain. He was found to be septic with acute kidney injury and left hand cellulitis. After admission he was noted to have a dusky swollen right foot. The patient reported that approximately 2 weeks prior he started feeling poorly and eating less. 9/7 he could not feel his right foot and fell as a result. He landed on his left wrist, shoulder, and back side. Since then his left hand has been very painful and his wrist and arm had begun to swell. He has developed thick erythematous streaking on his left arm.    In the ER: Lactic acid was 4.16, INR is 2.26 (not on anticoag), PTT is 24.7, WBC is 31.3, mag 1.3, potassium 2.7, procalcitonin 9.72. BP was 80/68 He received 3L of IV bolus in the ER.  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 Reportedly the patient injected his groin with testosterone 3 weeks ago.  HPI/Subjective: The patient is growing increasingly tired of laying in bed.  He states his pain is well controlled.  He denies shortness of breath chest pain nausea vomiting or abdominal pain.  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 Patient injected his right groin with testosterone 3 weeks prior to admit  - required emergent surgical I&D 9/15 - ongoing care per vascular surgery - went back to OR today - now on VAC  Acute renal failure Creatinine is 0.9 at baseline - 4.4 on admission - continues to slowly improve   Sinus tachycardia  At risk for DVT/PE - d dimer would not be helpful as will almost assuredly be elevated in this situation - can't obtain CTA presently due to renal fxn - persists - now back on full anticoag - will check TSH in AM but suspect this is a physiologic response to his severe infxn  Left hand cellulitis with radius fracture - history of MRSA abscess Evaluated by [name]  - to follow-up in his office in approximately 2 weeks  Possible pyelonephritis Urinalysis suggestive of infection - adequately cover with broad-spectrum empiric antibiotics - urine culture not helpful   Elevated INR with coagulopathy Felt to be due to sepsis - resolved   Hypokalemia and hypomagnesemia Recurrent - continue to replace and follow  Chronic pain has been on pain medications for 12 years - managed by Dr. [name] [facility]?) - also takes high dose scheduled klonopin - pain currently controlled on PCA  Code Status: FULL Family Communication: spoke w/ wife at bedside  Disposition Plan: SDU  Consultants: Hand Surgery Vascular Surgery  ID  Procedures: 9/15 - Right thigh abscess incision and drainage + excision of infected right superficial femoral artery aneurysm 9/17 - Repeat incision and drainage of right thigh abscess  Antibiotics: Zosyn 9/10 > 9/12 Vancomycin 9/10 > 9/15 Cefazolin 9/14 >  DVT prophylaxis: lovenox  Objective: Blood pressure 104/55, pulse 114, temperature 100.1 F (37.8 C), temperature source Oral, resp. rate 18, height  (1.905 m), weight 103.42 kg (228 lb), SpO2 97 %.  Intake/Output Summary (Last 24 hours) at 04/13/15 1631 Last data filed at 04/13/15 1557  Gross per 24 hour  Intake 4657.5 ml  Output   2750 ml  Net 1907.5 ml   Exam: General:  No acute respiratory distress  Lungs: Clear to auscultation bilaterally - no wheeze  Cardiovascular: Tachycardic but regular - no murmur appreciable  Abdomen: Nontender, nondistended, soft, bowel sounds positive, no rebound, no ascites, no appreciable mass Extremities: left hand in a splint - the right foot has a dusky appearance and is quite edematous as well with areas of punctate purpura - the right groin is dressed and dry at present w/ VAC in place   Data Reviewed: Basic Metabolic Panel:  Recent Labs Lab 04/07/15 0113  04/08/15 0319 04/09/15 0310 04/10/15 0247 04/11/15 0426 04/11/15 1428 04/11/15 1459 04/12/15 0350 04/13/15 1254  NA  --   < > 134* 136 137 137 140 138 134* 139  K 2.7*  < > 4.3 3.5 3.1* 4.3 4.0 4.3 3.3* 3.3*  CL  --   < > 107 110 110 108  --   --  107 112*  CO2  --   < > 16* 19* 19* 17*  --   --  19* 21*  GLUCOSE  --   < > 108* 136* 129* 94 97 109* 131* 103*  BUN  --   < > 52* 45* 29* 21*  --   --  18 14  CREATININE  --   < > 2.91* 2.33* 1.86* 1.63*  --   --  1.41* 1.34*  CALCIUM  --   < > 7.6* 7.6* 7.6* 7.6*  --   --  6.8* 7.0*  MG 1.3*  --  2.0  --  1.5* 1.3*  --   --  1.6*  --   < > = values in this interval not displayed.  CBC:  Recent Labs Lab 04/07/15 0008  04/09/15 0310 04/10/15 0247 04/11/15 0426 04/11/15 1428 04/11/15 1459 04/12/15 0350 04/13/15 1254  WBC 31.3*  < > 23.5* 28.6* 47.5*  --   --  26.9* 29.5*  NEUTROABS 28.7*  --   --  26.1*  --   --   --  25.0*  --   HGB 11.6*  < > 9.2* 8.4* 8.7* 7.5* 8.5* 7.3* 7.0*  HCT 34.0*  < > 27.7* 25.5* 26.1* 22.0* 25.0* 21.3* 21.0*  MCV 77.1*  < > 79.1 80.2 80.8  --   --  81.0 84.0  PLT 255  < > 280 296 PLATELET CLUMPS NOTED ON SMEAR, COUNT APPEARS ADEQUATE  --   --  277 336  < > = values in this interval not displayed.  Liver Function Tests:  Recent Labs Lab 04/09/15 0310 04/10/15 0247 04/11/15 0426 04/12/15 0350 04/13/15 1254  AST 34 37 41 22 27  ALT 12* 10*  ALKPHOS 142* 145*  151* 92 150*  BILITOT 1.5* 2.1* 1.5* 0.9 1.1  PROT 6.2* 5.5* 5.8* 5.0* 5.3*  ALBUMIN 1.3* 1.2* 1.2* 1.1* 1.1*    Coags:  Recent Labs Lab 04/07/15 0737 04/07/15 1610 04/08/15 0319 04/09/15 0310 04/12/15 0350  INR 2.26* 1.93* 1.61* 1.39 1.44    Recent Labs Lab 04/07/15 0737 04/07/15 1610 04/09/15 0310  APTT 39* 37 34  Cardiac Enzymes:  Recent Labs Lab 04/07/15 0008 04/07/15 0113  CKTOTAL  --  495*  TROPONINI <0.03  --      Recent Results (from the past 240 hour(s))  Culture, blood (routine x 2)     Status: None   Collection Time: 04/06/15 11:50 PM  Result Value Ref Range Status   Specimen Description BLOOD RIGHT HAND  Final   Special Requests   Final    BOTTLES DRAWN AEROBIC AND ANAEROBIC AER 10cc ANA 10cc   Culture  Setup Time   Final    GRAM POSITIVE COCCI IN CLUSTERS IN BOTH AEROBIC AND ANAEROBIC BOTTLES CORRECTED RESULTS CALLED TO: W DAVIS,RN AT 1151 04/08/15 BY L BENFIELD PREVIOUSLY REPORTED AS GRAM NEGATIVE COCCI AND GRAM VARIABLE COCCI    Culture   Final    STAPHYLOCOCCUS AUREUS SUSCEPTIBILITIES PERFORMED ON PREVIOUS CULTURE WITHIN THE LAST 5 DAYS. Performed at [facility]    Report Status 04/09/2015 FINAL  Final  Culture, blood (routine x 2)     Status: None   Collection Time: 04/07/15 12:05 AM  Result Value Ref Range Status   Specimen Description BLOOD RIGHT FOREARM  Final   Special Requests   Final    BOTTLES DRAWN AEROBIC AND ANAEROBIC AER 5cc ANA 5cc   Culture  Setup Time   Final    GRAM POSITIVE COCCI IN CLUSTERS IN BOTH AEROBIC AND ANAEROBIC BOTTLES CORRECTED RESULTS CALLED TO: W DAVIS,RN AT 1151 04/08/15 BY L BENFIELD PREVIOUSLY REPORTED AS GRAM NEGATIVE COCCI AND GRAM VARIABLE COCCI    Culture   Final    STAPHYLOCOCCUS AUREUS BACILLUS SPECIES Standardized susceptibility testing for this organism is not available. CRITICAL RESULT CALLED TO, READ BACK BY AND VERIFIED WITH: Hollace Kinnier AT 1318 04/09/15 BY L BENFIELD Performed  at [facility]    Report Status 04/09/2015 FINAL  Final   Organism ID, Bacteria STAPHYLOCOCCUS AUREUS  Final      Susceptibility   Staphylococcus aureus - MIC*    CIPROFLOXACIN <=0.5 SENSITIVE Sensitive     ERYTHROMYCIN <=0.25 SENSITIVE Sensitive     GENTAMICIN <=0.5 SENSITIVE Sensitive     OXACILLIN <=0.25 SENSITIVE Sensitive     TETRACYCLINE <=1 SENSITIVE Sensitive     VANCOMYCIN <=0.5 SENSITIVE Sensitive     TRIMETH/SULFA <=10 SENSITIVE Sensitive     CLINDAMYCIN <=0.25 SENSITIVE Sensitive     RIFAMPIN <=0.5 SENSITIVE Sensitive     Inducible Clindamycin NEGATIVE Sensitive     * STAPHYLOCOCCUS AUREUS  MRSA PCR Screening     Status: None   Collection Time: 04/07/15  6:30 AM  Result Value Ref Range Status   MRSA by PCR NEGATIVE NEGATIVE Final    Comment:        The GeneXpert MRSA Assay (FDA approved for NASAL specimens only), is one component of a comprehensive MRSA colonization surveillance program. It is not intended to diagnose MRSA infection nor to guide or monitor treatment for MRSA infections.   Urine culture     Status: None   Collection Time: 04/07/15  7:55 AM  Result Value Ref Range Status   Specimen Description URINE, RANDOM  Final   Special Requests NONE  Final   Culture NO GROWTH 1 DAY  Final   Report Status 04/08/2015 FINAL  Final  Culture, blood (routine x 2)     Status: None   Collection Time: 04/08/15  6:21 PM  Result Value Ref Range Status   Specimen Description BLOOD RIGHT ANTECUBITAL  Final  Special Requests BOTTLES DRAWN AEROBIC ONLY 4CC  Final   Culture NO GROWTH 5 DAYS  Final   Report Status 04/13/2015 FINAL  Final  Culture, routine-abscess     Status: None (Preliminary result)   Collection Time: 04/11/15  1:54 PM  Result Value Ref Range Status   Specimen Description ABSCESS RIGHT THIGH  Final   Special Requests PATIENT ON FOLLOWING ANCEF VANCOMYCIN  Final   Gram Stain   Final    FEW WBC PRESENT, PREDOMINANTLY PMN NO SQUAMOUS  EPITHELIAL CELLS SEEN RARE GRAM POSITIVE COCCI IN PAIRS IN CLUSTERS Performed at [facility]    Culture   Final    FEW STAPHYLOCOCCUS AUREUS Note: RIFAMPIN AND GENTAMICIN SHOULD NOT BE USED AS SINGLE DRUGS FOR TREATMENT OF STAPH INFECTIONS. Performed at [facility]    Report Status PENDING  Incomplete     Studies:   Recent x-ray studies have been reviewed in detail by the Attending Physician  Scheduled Meds:  Scheduled Meds: . sodium chloride   Intravenous Once  . atenolol  12.5 mg Oral Daily  .  ceFAZolin (ANCEF) IV  2 g Intravenous 3 times per day  . docusate sodium  100 mg Oral BID  . feeding supplement (ENSURE ENLIVE)  237 mL Oral BID BM  . morphine  90 mg Oral 2 times per day  . morphine   Intravenous 6 times per day  . naloxegol oxalate  25 mg Oral Daily  . ondansetron      . sodium chloride  3 mL Intravenous Q12H  . Tdap  0.5 mL Intramuscular Once  . venlafaxine XR  37.5 mg Oral Q breakfast    Time spent on care of this patient: 35 mins   MCCLUNG,JEFFREY T , MD   [facility] Office  [phone] Pager - Text Page per Loretha Stapler as per below:  On-Call/Text Page:      [email or web address]      password [email or web address]  If 7PM-7AM, please contact night-coverage [email or web address] Password [email or web address] 04/13/2015, 4:31 PM   LOS: 6 days

## 2015-04-13 NOTE — Op Note (Addendum)
OPERATIVE NOTE   PROCEDURE: 1. Repeat incision and drainage of right thigh abscess 2. Negative pressure dressing placement  PRE-OPERATIVE DIAGNOSIS: ruptured mycotic superficial femoral artery aneurysm  POST-OPERATIVE DIAGNOSIS: same as above   SURGEON: Leonides Sake, MD  ANESTHESIA: general  ESTIMATED BLOOD LOSS: 50 cc  FINDING(S): 1.  Medial extent of abscess cavity not previously appreciated: ~200 cc of mixed hematoma and pus 2.  Distal superficial femoral artery found and sutured ligated x 2 3.  Femoral vein at base of abscess cavity 4.  No bleeding from superficial femoral artery stump with intact double suture ligation   SPECIMEN(S):  none  INDICATIONS:   [name] is a 59 y.o. male who presents today for return to OR for re-evaluation of left thigh abscess cavity caused by ruptured mycotic superficial femoral artery aneurysm.  Risk include bleeding, infection, nerve damage, vessel damage, and need for additional procedures..  DESCRIPTION: After obtaining full informed written consent, the patient was brought back to the operating room and placed supine upon the operating table.  The patient received IV antibiotics prior to induction.  After obtaining adequate anesthesia, the patient was prepped and draped in the standard fashion for: right thigh exploration.  I washed out the abscess cavity with 3 L of sterile saline with a Pulsavac.  Some of the muscle appeared dead, so I debride the dead appearing muscle.  The medial superior portion of the cavity appeared to be somewhat ischemic, so I gentle probed this area.  A tract extending medial was found.  Upon opening this tract, another 200 cc of hematoma with pus decompressed.  I washed out this area with another 3 L of sterile saline with a Pulsavac.  At this point, I debride any frankly necrotic tissue with electrocautery.  In this process, I came across the distal superficial femoral artery.  This was ligated by running a  double layer suture of 5-0 Prolene.  I then placed a single 2-0 Vicryl suture ligature distally.  I continued the dissection and came across the femoral vein at the pass of the cavity.  The prior ruptured superficial femoral artery had essentially dissected around the entire neurovascular bundle.  I could see the accompanying motor nerve in proximity to the vein.  I dopplered the vein and there continued to be adequate perfusion without any evidence of deep vein thrombosis as the vein was pliable.  At also examined the superficial femoral artery stump.  Both of the suture ligatures were intact and the prior Tevadec tie was also in place.  There was a good pulse in the proximal superficial femoral artery stump.  I felt the wound was adequately debrided today but additional washout will be needed.  At this point, I elected to [facility] this wound.  I obtained a white sponge cut a segment to cover the femoral vein and adjacent nerve.  This was laid in the base of the wound.  I packed the wound with VAC sponge and then fashioned a sponge for the superficial layer.  I then cut a small shallow sponge piece for the staple line as the patient has been draining along the staple line also.  I affixed this piece and a bridging piece to the thigh VAC sponge with adhesive strips.  I affixed the thigh VAC sponge with a piece of adhesive strip.  I cut a hole in the sponge and attached the lilypad.  The VAC sponge tubing was connected to the VAC pump at 125  continuous.  Will plan on repeat washout on Monday.   COMPLICATIONS: none  CONDITION: guarded   Leonides Sake, MD [facility] Office: [phone] Pager: [phone]  04/13/2015, 9:03 AM

## 2015-04-13 NOTE — Progress Notes (Signed)
UR COMPLETED  

## 2015-04-13 NOTE — Anesthesia Procedure Notes (Signed)
Procedure Name: Intubation Date/Time: 04/13/2015 7:53 AM Performed by: Coralee Rud Pre-anesthesia Checklist: Patient identified, Emergency Drugs available, Suction available, Patient being monitored and Timeout performed Patient Re-evaluated:Patient Re-evaluated prior to inductionOxygen Delivery Method: Circle system utilized Preoxygenation: Pre-oxygenation with 100% oxygen Intubation Type: IV induction Ventilation: Mask ventilation without difficulty Laryngoscope Size: Glidescope and 3 Grade View: Grade I Tube type: Subglottic suction tube Tube size: 8.0 mm Number of attempts: 1 Airway Equipment and Method: Stylet and Video-laryngoscopy Placement Confirmation: ETT inserted through vocal cords under direct vision,  positive ETCO2 and breath sounds checked- equal and bilateral Secured at: 0 cm Tube secured with: Tape Dental Injury: Teeth and Oropharynx as per pre-operative assessment

## 2015-04-13 NOTE — Progress Notes (Signed)
Upon assessment, this RN noted the catheter of the arterial line was partially out.  Dressing still intact and in place. RT called to the room and noted the a-line was out and would need to be removed.  A-line was then removed.

## 2015-04-13 NOTE — Progress Notes (Addendum)
Patient febrile after first 15 minutes of blood transfusion. Asymptomatic otherwise. Blood transfusion stopped. Dr. Myra Gianotti notified. Order to give Tylenol for fever and to continue blood transfusion. Blood transfusion restarted. Will continue to monitor.

## 2015-04-13 NOTE — Anesthesia Preprocedure Evaluation (Signed)
Anesthesia Evaluation  Patient identified by MRN, date of birth, ID band Patient awake    Reviewed: Allergy & Precautions, H&P , NPO status , Patient's Chart, lab work & pertinent test results, reviewed documented beta blocker date and time   Airway Mallampati: II  TM Distance: >3 FB Neck ROM: Full    Dental no notable dental hx. (+) Teeth Intact, Dental Advisory Given   Pulmonary neg pulmonary ROS, former smoker,    Pulmonary exam normal breath sounds clear to auscultation       Cardiovascular hypertension, Pt. on home beta blockers and Pt. on medications + Peripheral Vascular Disease   Rhythm:Regular Rate:Normal  Echo 10/2013 - Left ventricle: The cavity size was normal. There was mildconcentric hypertrophy. Systolic function was normal. Theestimated ejection fraction was in the range of 50% to55%. Wall motion was normal; there were no regional wallmotion abnormalities. - Pulmonary arteries: Systolic pressure was mildly increased. PA peak pressure: 31mm Hg (S).    Neuro/Psych negative neurological ROS  negative psych ROS   GI/Hepatic Neg liver ROS, PUD,   Endo/Other  negative endocrine ROS  Renal/GU Renal disease     Musculoskeletal   Abdominal   Peds  Hematology negative hematology ROS (+)   Anesthesia Other Findings On oxygen, sweating, tachycardic to 110s, single 20 gauge left hand IV and no A line   Reproductive/Obstetrics negative OB ROS                             Anesthesia Physical  Anesthesia Plan  ASA: IV  Anesthesia Plan: General   Post-op Pain Management:    Induction: Intravenous  Airway Management Planned: Oral ETT  Additional Equipment:   Intra-op Plan:   Post-operative Plan: Extubation in OR  Informed Consent: I have reviewed the patients History and Physical, chart, labs and discussed the procedure including the risks, benefits and alternatives for  the proposed anesthesia with the patient or authorized representative who has indicated his/her understanding and acceptance.   Dental advisory given and Dental Advisory Given  Plan Discussed with: CRNA and Anesthesiologist  Anesthesia Plan Comments:         Anesthesia Quick Evaluation

## 2015-04-13 NOTE — Progress Notes (Addendum)
This RN wasted 3.5mg  morphine PCA syringe in sink with [place] Kyo Cocuzza, RN.

## 2015-04-13 NOTE — Progress Notes (Signed)
Patient ID: [name], male   DOB: January 07, 1956, 58 y.o.   MRN: [id number]         [facility]    Date of Admission:  04/06/2015   Total days of antibiotics 7        Day 4 cefazolin         Principal Problem:   Severe sepsis Active Problems:   Chronic pain syndrome   History of duodenal ulcer   ARF (acute renal failure)   Cellulitis of left hand   Elevated INR   Foot swelling   Elevated bilirubin   Hypokalemia   Hypomagnesemia   Femoral artery aneurysm, right   UTI (lower urinary tract infection)   Bacteremia   Bacteremia due to Staphylococcus aureus   Acute renal failure syndrome   Fracture of left radius   Cellulitis   Arm pain, anterior   . sodium chloride   Intravenous Once  . atenolol  12.5 mg Oral Daily  .  ceFAZolin (ANCEF) IV  2 g Intravenous 3 times per day  . docusate sodium  100 mg Oral BID  . enoxaparin (LOVENOX) injection  40 mg Subcutaneous Daily  . feeding supplement (ENSURE ENLIVE)  237 mL Oral BID BM  . morphine  90 mg Oral 2 times per day  . morphine   Intravenous 6 times per day  . naloxegol oxalate  25 mg Oral Daily  . ondansetron      . sodium chloride  1,000 mL Intravenous Once  . sodium chloride  3 mL Intravenous Q12H  . Tdap  0.5 mL Intramuscular Once  . venlafaxine XR  37.5 mg Oral Q breakfast   Past Medical History  Diagnosis Date  . Hypertension   . Chronic knee pain   . Hyperlipemia   . PUD (peptic ulcer disease)     Social History  Substance Use Topics  . Smoking status: Former Smoker    Types: Cigarettes  . Smokeless tobacco: None  . Alcohol Use: No    Family History  Problem Relation Age of Onset  . Heart attack Paternal Grandmother 12  . Heart disease Mother 42    arrythmia  . Stroke Paternal Grandfather   . Stroke Maternal Grandfather    Allergies  Allergen Reactions  . Imitrex [Sumatriptan] Other (See Comments)    Chest pain, 2004 (tolerates 50 mg prn)  . Verapamil Other (See  Comments)    Causes pvc's    OBJECTIVE: Filed Vitals:   04/13/15 1015 04/13/15 1030 04/13/15 1041 04/13/15 1125  BP: 93/51 91/53 94/57  86/49  Pulse: 122 121 120 113  Temp:  100.4 F (38 C)  99.5 F (37.5 C)  TempSrc:    Oral  Resp: 15 11 12 16   Height:      Weight:      SpO2: 98% 96% 96% 96%   Body mass index is 28.5 kg/(m^2).   Lab Results Lab Results  Component Value Date   WBC 26.9* 04/12/2015   HGB 7.3* 04/12/2015   HCT 21.3* 04/12/2015   MCV 81.0 04/12/2015   PLT 277 04/12/2015    Lab Results  Component Value Date   CREATININE 1.41* 04/12/2015   BUN 18 04/12/2015   NA 134* 04/12/2015   K 3.3* 04/12/2015   CL 107 04/12/2015   CO2 19* 04/12/2015    Lab Results  Component Value Date   ALT 12* 04/12/2015   AST 22 04/12/2015   ALKPHOS 92 04/12/2015  BILITOT 0.9 04/12/2015     Microbiology: Recent Results (from the past 240 hour(s))  Culture, blood (routine x 2)     Status: None   Collection Time: 04/06/15 11:50 PM  Result Value Ref Range Status   Specimen Description BLOOD RIGHT HAND  Final   Special Requests   Final    BOTTLES DRAWN AEROBIC AND ANAEROBIC AER 10cc ANA 10cc   Culture  Setup Time   Final    GRAM POSITIVE COCCI IN CLUSTERS IN BOTH AEROBIC AND ANAEROBIC BOTTLES CORRECTED RESULTS CALLED TO: Craig DAVIS,RN AT 1151 04/08/15 BY [name] PREVIOUSLY REPORTED AS GRAM NEGATIVE COCCI AND GRAM VARIABLE COCCI    Culture   Final    STAPHYLOCOCCUS AUREUS SUSCEPTIBILITIES PERFORMED ON PREVIOUS CULTURE WITHIN THE LAST 5 DAYS. Performed at [facility]    Report Status 04/09/2015 FINAL  Final  Culture, blood (routine x 2)     Status: None   Collection Time: 04/07/15 12:05 AM  Result Value Ref Range Status   Specimen Description BLOOD RIGHT FOREARM  Final   Special Requests   Final    BOTTLES DRAWN AEROBIC AND ANAEROBIC AER 5cc ANA 5cc   Culture  Setup Time   Final    GRAM POSITIVE COCCI IN CLUSTERS IN BOTH AEROBIC AND ANAEROBIC  BOTTLES CORRECTED RESULTS CALLED TO: Craig DAVIS,RN AT 1151 04/08/15 BY [name] PREVIOUSLY REPORTED AS GRAM NEGATIVE COCCI AND GRAM VARIABLE COCCI    Culture   Final    STAPHYLOCOCCUS AUREUS BACILLUS SPECIES Standardized susceptibility testing for this organism is not available. CRITICAL RESULT CALLED TO, READ BACK BY AND VERIFIED WITH: [name] AT 1318 04/09/15 BY [name] Performed at [facility]    Report Status 04/09/2015 FINAL  Final   Organism ID, Bacteria STAPHYLOCOCCUS AUREUS  Final      Susceptibility   Staphylococcus aureus - MIC*    CIPROFLOXACIN <=0.5 SENSITIVE Sensitive     ERYTHROMYCIN <=0.25 SENSITIVE Sensitive     GENTAMICIN <=0.5 SENSITIVE Sensitive     OXACILLIN <=0.25 SENSITIVE Sensitive     TETRACYCLINE <=1 SENSITIVE Sensitive     VANCOMYCIN <=0.5 SENSITIVE Sensitive     TRIMETH/SULFA <=10 SENSITIVE Sensitive     CLINDAMYCIN <=0.25 SENSITIVE Sensitive     RIFAMPIN <=0.5 SENSITIVE Sensitive     Inducible Clindamycin NEGATIVE Sensitive     * STAPHYLOCOCCUS AUREUS  MRSA PCR Screening     Status: None   Collection Time: 04/07/15  6:30 AM  Result Value Ref Range Status   MRSA by PCR NEGATIVE NEGATIVE Final    Comment:        The GeneXpert MRSA Assay (FDA approved for NASAL specimens only), is one component of a comprehensive MRSA colonization surveillance program. It is not intended to diagnose MRSA infection nor to guide or monitor treatment for MRSA infections.   Urine culture     Status: None   Collection Time: 04/07/15  7:55 AM  Result Value Ref Range Status   Specimen Description URINE, RANDOM  Final   Special Requests NONE  Final   Culture NO GROWTH 1 DAY  Final   Report Status 04/08/2015 FINAL  Final  Culture, blood (routine x 2)     Status: None (Preliminary result)   Collection Time: 04/08/15  6:21 PM  Result Value Ref Range Status   Specimen Description BLOOD RIGHT ANTECUBITAL  Final   Special Requests BOTTLES DRAWN AEROBIC ONLY  4CC  Final   Culture NO GROWTH  4 DAYS  Final   Report Status PENDING  Incomplete  Culture, routine-abscess     Status: None (Preliminary result)   Collection Time: 04/11/15  1:54 PM  Result Value Ref Range Status   Specimen Description ABSCESS RIGHT THIGH  Final   Special Requests PATIENT ON FOLLOWING ANCEF VANCOMYCIN  Final   Gram Stain   Final    FEW WBC PRESENT, PREDOMINANTLY PMN NO SQUAMOUS EPITHELIAL CELLS SEEN RARE GRAM POSITIVE COCCI IN PAIRS IN CLUSTERS Performed at [facility]    Culture PENDING  Incomplete   Report Status PENDING  Incomplete     ASSESSMENT: He has MSSA bacteremia complicated by a 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 Repeat blood cultures became negative within 24 hours. Gram stain of right thigh abscess revealed gram-positive cocci and cultures are pending. His wound was debrided again today and a VAC dressing was placed.  PLAN: 1. Continue cefazolin  Craig Asters, MD [facility] [phone] pager   [phone] cell 04/13/2015, 12:00 PM

## 2015-04-14 ENCOUNTER — Inpatient Hospital Stay (HOSPITAL_COMMUNITY): Payer: Medicaid Other

## 2015-04-14 DIAGNOSIS — R7881 Bacteremia: Secondary | ICD-10-CM

## 2015-04-14 DIAGNOSIS — B9561 Methicillin susceptible Staphylococcus aureus infection as the cause of diseases classified elsewhere: Secondary | ICD-10-CM

## 2015-04-14 LAB — CBC
HCT: 23.8 % — ABNORMAL LOW (ref 39.0–52.0)
Hemoglobin: 7.9 g/dL — ABNORMAL LOW (ref 13.0–17.0)
MCH: 28 pg (ref 26.0–34.0)
MCHC: 33.2 g/dL (ref 30.0–36.0)
MCV: 84.4 fL (ref 78.0–100.0)
PLATELETS: 399 10*3/uL (ref 150–400)
RBC: 2.82 MIL/uL — AB (ref 4.22–5.81)
RDW: 16.9 % — AB (ref 11.5–15.5)
WBC: 23.3 10*3/uL — AB (ref 4.0–10.5)

## 2015-04-14 LAB — TSH: TSH: 3.019 u[IU]/mL (ref 0.350–4.500)

## 2015-04-14 LAB — HEPARIN LEVEL (UNFRACTIONATED): Heparin Unfractionated: <0.1 IU/mL — ABNORMAL LOW (ref 0.30–0.70)

## 2015-04-14 LAB — CULTURE, ROUTINE-ABSCESS

## 2015-04-14 LAB — MAGNESIUM: MAGNESIUM: 1.8 mg/dL (ref 1.7–2.4)

## 2015-04-14 LAB — PHOSPHORUS: PHOSPHORUS: 3.5 mg/dL (ref 2.5–4.6)

## 2015-04-14 MED ORDER — NALOXONE HCL 0.4 MG/ML IJ SOLN: 0.4000 mg | INTRAMUSCULAR | Status: DC | PRN

## 2015-04-14 MED ORDER — ONDANSETRON HCL 4 MG/2ML IJ SOLN
4.0000 mg | Freq: Four times a day (QID) | INTRAMUSCULAR | Status: DC | PRN
  Administered 2015-04-19: 4 mg via INTRAVENOUS

## 2015-04-14 MED ORDER — METOPROLOL TARTRATE 25 MG PO TABS
25.0000 mg | ORAL_TABLET | Freq: Two times a day (BID) | ORAL | Status: DC
  Administered 2015-04-14 – 2015-04-27 (×27): 25 mg via ORAL
  Filled 2015-04-14 (×28): qty 1

## 2015-04-14 MED ORDER — DIPHENHYDRAMINE HCL 50 MG/ML IJ SOLN
12.5000 mg | Freq: Four times a day (QID) | INTRAMUSCULAR | Status: DC | PRN
  Administered 2015-04-23: 12.5 mg via INTRAVENOUS
  Filled 2015-04-14: qty 1

## 2015-04-14 MED ORDER — ADENOSINE 6 MG/2ML IV SOLN
INTRAVENOUS | Status: AC
  Administered 2015-04-14: 6 mg via INTRAVENOUS
  Filled 2015-04-14: qty 4

## 2015-04-14 MED ORDER — HEPARIN BOLUS VIA INFUSION
3000.0000 [IU] | Freq: Once | INTRAVENOUS | Status: AC
  Administered 2015-04-14: 3000 [IU] via INTRAVENOUS
  Filled 2015-04-14: qty 3000

## 2015-04-14 MED ORDER — SODIUM CHLORIDE 0.9 % IJ SOLN: 9.0000 mL | INTRAMUSCULAR | Status: DC | PRN

## 2015-04-14 MED ORDER — POTASSIUM CHLORIDE CRYS ER 20 MEQ PO TBCR
40.0000 meq | EXTENDED_RELEASE_TABLET | Freq: Two times a day (BID) | ORAL | Status: DC
  Administered 2015-04-14 (×2): 40 meq via ORAL
  Filled 2015-04-14 (×3): qty 2

## 2015-04-14 MED ORDER — METOPROLOL TARTRATE 1 MG/ML IV SOLN
INTRAVENOUS | Status: AC
  Filled 2015-04-14: qty 10

## 2015-04-14 MED ORDER — MAGNESIUM SULFATE 2 GM/50ML IV SOLN
2.0000 g | Freq: Once | INTRAVENOUS | Status: AC
  Administered 2015-04-14: 2 g via INTRAVENOUS
  Filled 2015-04-14: qty 50

## 2015-04-14 MED ORDER — ADENOSINE 6 MG/2ML IV SOLN
6.0000 mg | Freq: Once | INTRAVENOUS | Status: AC
  Administered 2015-04-14: 6 mg via INTRAVENOUS

## 2015-04-14 MED ORDER — METOPROLOL TARTRATE 1 MG/ML IV SOLN
10.0000 mg | Freq: Once | INTRAVENOUS | Status: AC
  Administered 2015-04-14: 10 mg via INTRAVENOUS

## 2015-04-14 MED ORDER — HEPARIN BOLUS VIA INFUSION
4000.0000 [IU] | Freq: Once | INTRAVENOUS | Status: AC
  Administered 2015-04-15: 4000 [IU] via INTRAVENOUS
  Filled 2015-04-14: qty 4000

## 2015-04-14 MED ORDER — DIPHENHYDRAMINE HCL 12.5 MG/5ML PO ELIX
12.5000 mg | ORAL_SOLUTION | Freq: Four times a day (QID) | ORAL | Status: DC | PRN
  Filled 2015-04-14: qty 5

## 2015-04-14 MED ORDER — SODIUM CHLORIDE 0.9 % IV BOLUS (SEPSIS)
500.0000 mL | Freq: Once | INTRAVENOUS | Status: AC
  Administered 2015-04-14: 500 mL via INTRAVENOUS

## 2015-04-14 MED ORDER — MORPHINE SULFATE 1 MG/ML IV SOLN
INTRAVENOUS | Status: DC
  Administered 2015-04-14: 17:00:00 via INTRAVENOUS
  Administered 2015-04-14: 18.09 mg via INTRAVENOUS
  Administered 2015-04-14: 15 mg via INTRAVENOUS
  Administered 2015-04-14: 7.4 mg via INTRAVENOUS
  Administered 2015-04-15: 26.5 mg via INTRAVENOUS
  Administered 2015-04-15: 20.7 mL via INTRAVENOUS
  Administered 2015-04-15: 1 mg via INTRAVENOUS
  Administered 2015-04-15 (×2): via INTRAVENOUS
  Administered 2015-04-15: 14.34 mg via INTRAVENOUS
  Administered 2015-04-15: 21.98 mg via INTRAVENOUS
  Administered 2015-04-15 (×2): via INTRAVENOUS
  Administered 2015-04-15: 13.5 mg via INTRAVENOUS
  Administered 2015-04-16 (×2): via INTRAVENOUS
  Administered 2015-04-16: 1 mg via INTRAVENOUS
  Administered 2015-04-16: 27.5 mg via INTRAVENOUS
  Administered 2015-04-16: 08:00:00 via INTRAVENOUS
  Administered 2015-04-16: 6.39 mg via INTRAVENOUS
  Administered 2015-04-16: 15 mg via INTRAVENOUS
  Administered 2015-04-16: 24.95 mg via INTRAVENOUS
  Administered 2015-04-17: 11:00:00 via INTRAVENOUS
  Administered 2015-04-17: 4.5 mg via INTRAVENOUS
  Administered 2015-04-17: 24.95 mg via INTRAVENOUS
  Administered 2015-04-17: 30 mg via INTRAVENOUS
  Administered 2015-04-17: 7.5 mg via INTRAVENOUS
  Administered 2015-04-17 (×2): via INTRAVENOUS
  Administered 2015-04-18: 20.52 mg via INTRAVENOUS
  Administered 2015-04-18: 03:00:00 via INTRAVENOUS
  Administered 2015-04-18: 25.36 mg via INTRAVENOUS
  Administered 2015-04-18 (×2): via INTRAVENOUS
  Administered 2015-04-18: 17.43 mg via INTRAVENOUS
  Administered 2015-04-18: 16.5 mg via INTRAVENOUS
  Administered 2015-04-18: 22.13 mg via INTRAVENOUS
  Administered 2015-04-19: 18 mg via INTRAVENOUS
  Administered 2015-04-19: 15 mg via INTRAVENOUS
  Administered 2015-04-19: 05:00:00 via INTRAVENOUS
  Administered 2015-04-19: 10.33 mg via INTRAVENOUS
  Administered 2015-04-19: 18:00:00 via INTRAVENOUS
  Administered 2015-04-20: 9 mg via INTRAVENOUS
  Administered 2015-04-20: 18 mg via INTRAVENOUS
  Administered 2015-04-20: 1 mg via INTRAVENOUS
  Administered 2015-04-20: 22:00:00 via INTRAVENOUS
  Administered 2015-04-20: 12 mg via INTRAVENOUS
  Administered 2015-04-20: 13.5 mg via INTRAVENOUS
  Administered 2015-04-20: 01:00:00 via INTRAVENOUS
  Administered 2015-04-20: 9 mg via INTRAVENOUS
  Administered 2015-04-21: 3 mg via INTRAVENOUS
  Administered 2015-04-21: 10.5 mg via INTRAVENOUS
  Administered 2015-04-21: 7.32 mg via INTRAVENOUS
  Administered 2015-04-21: 7.5 mL via INTRAVENOUS
  Administered 2015-04-21: 7.5 mg via INTRAVENOUS
  Administered 2015-04-21: 12 mg via INTRAVENOUS
  Administered 2015-04-21: 20:00:00 via INTRAVENOUS
  Administered 2015-04-22: 7.36 mg via INTRAVENOUS
  Administered 2015-04-22: 20.77 mg via INTRAVENOUS
  Administered 2015-04-22 (×2): via INTRAVENOUS
  Administered 2015-04-22: 7.5 mg via INTRAVENOUS
  Administered 2015-04-22: 17.97 mg via INTRAVENOUS
  Administered 2015-04-22: 12:00:00 via INTRAVENOUS
  Administered 2015-04-23: 22.5 mg via INTRAVENOUS
  Administered 2015-04-23: 15:00:00 via INTRAVENOUS
  Administered 2015-04-23: 1.5 mg via INTRAVENOUS
  Administered 2015-04-23: 8.7 mg via INTRAVENOUS
  Administered 2015-04-23: 15 mg via INTRAVENOUS
  Administered 2015-04-23: 3 mg via INTRAVENOUS
  Administered 2015-04-24: 1.5 mg via INTRAVENOUS
  Administered 2015-04-24 (×2): via INTRAVENOUS
  Administered 2015-04-24: 7.5 mg via INTRAVENOUS
  Administered 2015-04-24: 13.5 mg via INTRAVENOUS
  Administered 2015-04-24: 01:00:00 via INTRAVENOUS
  Administered 2015-04-24: 12 mg via INTRAVENOUS
  Administered 2015-04-25: 10.5 mg via INTRAVENOUS
  Administered 2015-04-25: 6 mg via INTRAVENOUS
  Administered 2015-04-25: 25.5 mg via INTRAVENOUS
  Administered 2015-04-25: 2.51 mg via INTRAVENOUS
  Administered 2015-04-25: 31.5 mg via INTRAVENOUS
  Administered 2015-04-25: 1 mg via INTRAVENOUS
  Administered 2015-04-25: 07:00:00 via INTRAVENOUS
  Administered 2015-04-26: 12 mg via INTRAVENOUS
  Administered 2015-04-26: 14.41 mg via INTRAVENOUS
  Administered 2015-04-26: 1 mg via INTRAVENOUS
  Administered 2015-04-26: 9 mg via INTRAVENOUS
  Administered 2015-04-26: 1 mg via INTRAVENOUS
  Administered 2015-04-26: 19:00:00 via INTRAVENOUS
  Administered 2015-04-26: 6 mg via INTRAVENOUS
  Administered 2015-04-27 (×2): via INTRAVENOUS
  Filled 2015-04-14 (×41): qty 25

## 2015-04-14 NOTE — Consult Note (Signed)
CARDIOLOGY CONSULT NOTE  Patient ID: [name] MRN: [id number] DOB/AGE: September 06, 1955 59 y.o.  Admit date: 04/06/2015 Referring Physician  Jetty Duhamel, MD Primary Physician:  [name], MD Reason for Consultation  Tachycardia/SVT  HPI: [name]  is a 59 y.o. male  With history of hyperlipidemia, hypertension, hypogonadism who was taking testosterone supplementation, accidentally injected into the right femoral artery followed by sepsis, mycotic aneurysm of the femoral artery and embolic complication. He has been in sinus tachycardia since admission to the hospital, however overnight she developed marked tachycardia with suggestion of either atrial fibrillation or SVT, I was asked to see the patient to give my opinion.  Patient denies any palpitations, chest pain, shortness of breath. He has not had bowel movements in the past 10 days, but states that he has been eating little and he has not had any urge to pass bowels. Denies any abdominal discomfort. He is not a diabetic, does not smoke tobacco.  Past Medical History  Diagnosis Date  . Hypertension   . Chronic knee pain   . Hyperlipemia   . PUD (peptic ulcer disease)      Past Surgical History  Procedure Laterality Date  . I&d extremity Left 07/12/2013    Procedure: I&D Left Thigh Abscess;  Surgeon: [name], MD;  Location: [facility] OR;  Service: Orthopedics;  Laterality: Left;  . Replacement total knee bilateral    . Tonsillectomy    . Hernia repair    . Cholecystectomy    . Appendectomy    . Shoulder surgery    . Elbow surgery    . Femoral artery exploration Right 04/11/2015    Procedure: Resection of infected right femoral artery aneurysm;  Surgeon: Fransisco Hertz, MD;  Location: [facility] OR;  Service: Vascular;  Laterality: Right;  . Incision and drainage abscess Right 04/11/2015    Procedure: INCISION AND DRAINAGE OF RIGHT THIGH ABSCESS;  Surgeon: Fransisco Hertz, MD;  Location: [facility] OR;  Service: Vascular;  Laterality: Right;      Family History  Problem Relation Age of Onset  . Heart attack Paternal Grandmother 109  . Heart disease Mother 49    arrythmia  . Stroke Paternal Grandfather   . Stroke Maternal Grandfather      Social History: Social History   Social History  . Marital Status: Married    Spouse Name: N/A  . Number of Children: 3  . Years of Education: N/A   Occupational History  .      Sales   Social History Main Topics  . Smoking status: Former Smoker    Types: Cigarettes  . Smokeless tobacco: Not on file  . Alcohol Use: No  . Drug Use: No  . Sexual Activity: Not on file   Other Topics Concern  . Not on file   Social History Narrative     Prescriptions prior to admission  Medication Sig Dispense Refill Last Dose  . albuterol (PROAIR HFA) 108 (90 BASE) MCG/ACT inhaler Inhale 1 puff into the lungs every 6 (six) hours as needed for wheezing or shortness of breath.    04/06/2015 at Unknown time  . atenolol (TENORMIN) 100 MG tablet Take 100 mg by mouth daily.   2-3 days ago at usually around 6am  . atorvastatin (LIPITOR) 80 MG tablet Take 80 mg by mouth daily.   few days ago  . clonazePAM (KLONOPIN) 2 MG tablet Take 2 mg by mouth 4 (four) times daily.   04/07/2015 at 1400  .  Diclofenac-Misoprostol 75-0.2 MG TBEC Take 1 tablet by mouth 2 (two) times daily. Arthrotec  0 04/06/2015 at pm  . famciclovir (FAMVIR) 500 MG tablet Take 500 mg by mouth 2 (two) times daily as needed (fever blisters).    1-2 weeks ago  . ibuprofen (ADVIL,MOTRIN) 800 MG tablet Take 1 tablet (800 mg total) by mouth 3 (three) times daily. (Patient taking differently: Take 800 mg by mouth 2 (two) times daily as needed (pain). ) 21 tablet 0 3 weeks ago  . morphine (MS CONTIN) 30 MG 12 hr tablet Take 30 mg by mouth every 12 (twelve) hours. Take with a 60 mg tablet for a 90 mg dose  0 04/06/2015 at 900  . morphine (MS CONTIN) 60 MG 12 hr tablet Take 60 mg by mouth every 12 (twelve) hours. Take with a 30 mg tablet for a 90 mg  dose  0 04/06/2015 at 900  . naloxegol oxalate (MOVANTIK) 25 MG TABS tablet Take 25 mg by mouth 2 (two) times daily.   04/05/2015  . ondansetron (ZOFRAN) 8 MG tablet Take 8 mg by mouth daily as needed for nausea or vomiting.   0 04/06/2015 at Unknown time  . OVER THE COUNTER MEDICATION Place 1 drop into both eyes 2 (two) times daily as needed (dry eyes). OTC lubricant eye drops   2-3 weeks ago  . oxycodone (ROXICODONE) 30 MG immediate release tablet Take 30 mg by mouth every 6 (six) hours. scheduled   04/06/2015 at pm  . SUMAtriptan (IMITREX) 50 MG tablet Take 50 mg by mouth daily as needed for migraine or headache. Maximum 2 doses in 2 days  0 couple weeks ago  . testosterone cypionate (DEPOTESTOSTERONE CYPIONATE) 200 MG/ML injection Inject 400 mg into the muscle once a week. On Wednesday  0 04/03/2015  . trifluridine (VIROPTIC) 1 % ophthalmic solution Place 1 drop into both eyes 2 (two) times daily as needed (breakouts (blurry, matted eyelids)).   3 weeks ago  . venlafaxine XR (EFFEXOR-XR) 37.5 MG 24 hr capsule Take 37.5 mg by mouth daily with breakfast.   04/04/2015  . colchicine 0.6 MG tablet Take 1 tablet (0.6 mg total) by mouth 2 (two) times daily. (Patient not taking: Reported on 04/07/2015) 14 tablet 0 Not Taking at Unknown time  . oxyCODONE-acetaminophen (PERCOCET/ROXICET) 5-325 MG per tablet Take 1 tablet by mouth every 6 (six) hours as needed for severe pain. (Patient not taking: Reported on 04/07/2015) 10 tablet 0 Not Taking at Unknown time     ROS: General: no fevers/chills/night sweats, feels generally weak, complains of left arm pain since fall, presently immobilized and unable to get up. Skin rash present. No fever or chills over the past 24 hours. Denies urinary disturbances.  Physical Exam: Blood pressure 122/76, pulse 112, temperature 99.7 F (37.6 C), temperature source Oral, resp. rate 21, height  (1.905 m), weight 103.42 kg (228 lb), SpO2 96 %.   General appearance: alert,  cooperative, appears stated age, no distress and moderately obese Lungs: clear to auscultation bilaterally Chest wall: no tenderness Heart: regular rate and rhythm, S1, S2 normal, no murmur, click, rub or gallop and Tachycardia present Abdomen: soft, non-tender; bowel sounds normal; no masses,  no organomegaly Extremities: Limited examination, right groin ecchymosis and surgical site evident, right greater toe with what appears like an embolic infarct, left lower extremity normal, 2+ edema right worse than the left. Pulses: Radial pulses are normal, pedal pulses could not be felt bilaterally. Neurologic: Grossly normal  Labs:   Lab Results  Component Value Date   WBC 23.3* 04/14/2015   HGB 7.9* 04/14/2015   HCT 23.8* 04/14/2015   MCV 84.4 04/14/2015   PLT 399 04/14/2015    Recent Labs Lab 04/13/15 1254  NA 139  K 3.3*  CL 112*  CO2 21*  BUN 14  CREATININE 1.34*  CALCIUM 7.0*  PROT 5.3*  BILITOT 1.1  ALKPHOS 150*  ALT 10*  AST 27  GLUCOSE 103*    Lipid Panel     Component Value Date/Time   CHOL 152 11/16/2013 0420   TRIG 134 11/16/2013 0420   HDL 36* 11/16/2013 0420   CHOLHDL 4.2 11/16/2013 0420   VLDL 27 11/16/2013 0420   LDLCALC 89 11/16/2013 0420    BNP (last 3 results) No results for input(s): BNP in the last 8760 hours.  ProBNP (last 3 results) No results for input(s): PROBNP in the last 8760 hours.  HEMOGLOBIN A1C Lab Results  Component Value Date   HGBA1C 6.0* 07/15/2013   MPG 126* 07/15/2013    Cardiac Panel (last 3 results)  Recent Labs  03/01/15 1645 03/01/15 1920 04/07/15 0008 04/07/15 0113  CKTOTAL  --   --   --  495*  TROPONINI <0.03 <0.03 <0.03  --     Lab Results  Component Value Date   CKTOTAL 495* 04/07/2015   TROPONINI <0.03 04/07/2015     TSH  Recent Labs  04/14/15 0250  TSH 3.019    EKG: 04/12/2059 revealed sinus tachycardia otherwise normal, EKG from 04/14/2015 reveals episodes of atrial tachycardia with  variable AV conduction. Ventricular rate of 130-140 bpm.  Intravenous adenosine 6 mg administration reveals baseline artifact, patient converted to sinus tachycardia at the rate of 119 bpm.  Scheduled Meds: .  ceFAZolin (ANCEF) IV  2 g Intravenous 3 times per day  . docusate sodium  100 mg Oral BID  . feeding supplement (ENSURE ENLIVE)  237 mL Oral BID BM  . magnesium sulfate 1 - 4 g bolus IVPB  2 g Intravenous Once  . metoprolol tartrate  25 mg Oral BID  . morphine  90 mg Oral 2 times per day  . morphine   Intravenous 6 times per day  . naloxegol oxalate  25 mg Oral Daily  . potassium chloride  40 mEq Oral BID  . sodium chloride  3 mL Intravenous Q12H  . Tdap  0.5 mL Intramuscular Once  . venlafaxine XR  37.5 mg Oral Q breakfast   Continuous Infusions: . sodium chloride 30 mL/hr at 04/14/15 1158  . heparin 2,300 Units/hr (04/14/15 0351)   PRN Meds:.acetaminophen **OR** acetaminophen, clonazePAM, diphenhydrAMINE **OR** diphenhydrAMINE, famciclovir, levalbuterol, naloxone **AND** sodium chloride, ondansetron (ZOFRAN) IV, oxyCODONE-acetaminophen, trifluridine  ASSESSMENT AND PLAN:  1. Atrial tachycardia with variable AV conduction, converted to sinus tachycardia with 6 mg of intravenous and innocent. 2. 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  Whether consideration for peripheral arteriogram to evaluate and see if there is a foci of sepsis or vegetation that can be aspirated should be considered. I will discuss this with vascular surgery. Obviously unable to place IVC  filter due to risk of  seeding with bacterial infection. From cardiac standpoint I would recommend metoprolol 25 mg by mouth twice a day. I will continue to follow him sidelines.  Yates Decamp, MD 04/14/2015, 1:04 PM [facility] Pager: [phone] Office: [phone] If no answer Cell [phone]

## 2015-04-14 NOTE — Progress Notes (Signed)
Dr. Jacinto Halim at bedside. Patient placed on zoll monitor. Adenosine  IV given. Patient asymptomatic, converted to ST.

## 2015-04-14 NOTE — Procedures (Signed)
Central Venous Catheter Insertion Procedure Note [name] [id number] 08-20-1955  Procedure: Insertion of Central Venous Catheter Indications: Assessment of intravascular volume and Drug and/or fluid administration  Procedure Details Consent: Risks of procedure as well as the alternatives and risks of each were explained to the (patient/caregiver).  Consent for procedure obtained. Time Out: Verified patient identification, verified procedure, site/side was marked, verified correct patient position, special equipment/implants available, medications/allergies/relevent history reviewed, required imaging and test results available.  Performed  Maximum sterile technique was used including antiseptics, cap, gloves, gown, hand hygiene, mask and sheet. Skin prep: Chlorhexidine; local anesthetic administered A antimicrobial bonded/coated triple lumen catheter was placed in the left internal jugular vein using the Seldinger technique.  Evaluation Blood flow good Complications: No apparent complications Patient did tolerate procedure well. Chest X-ray ordered to verify placement.  CXR: pending.   Performed under direct MD supervision.  Performed using ultrasound guidance.  Wire visualized in vessel under ultrasound.   Dirk Dress, NP 04/14/2015  3:50 PM Pager: [phone] or [phone]   Levy Pupa, MD, PhD 04/14/2015, 3:53 PM [facility] [phone] or if no answer [phone]

## 2015-04-14 NOTE — Progress Notes (Signed)
[place] TEAM 1 - Stepdown/ICU TEAM PROGRESS NOTE  [name] ZOX:[id number] DOB: [birth year] DOA: 04/06/2015 PCP: [name], MD  Admit HPI / Brief Narrative: 59 y.o. Male with PUD, HLD, history of MRSA abscesses, peripheral neuropathy, and chronic pain who presented to [facility] with severe left hand pain. He was found to be septic with acute kidney injury and left hand cellulitis. After admission he was noted to have a dusky swollen right foot. The patient reported that approximately 2 weeks prior he started feeling poorly and eating less. 9/7 he could not feel his right foot and fell as a result. He landed on his left wrist, shoulder, and back side. Since then his left hand has been very painful and his wrist and arm had begun to swell. He has developed thick erythematous streaking on his left arm.    In the ER: Lactic acid was 4.16, INR is 2.26 (not on anticoag), PTT is 24.7, WBC is 31.3, mag 1.3, potassium 2.7, procalcitonin 9.72. BP was 80/68 He received 3L of IV bolus in the ER.  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 Reportedly the patient injected his groin with testosterone 3 weeks ago.  HPI/Subjective: The pt denies any new pain today, and states his usual pain is under reasonable control.  This morning he was noted to covnert into an SVT w/ HR 170-180.  He was stable/alert w/ SBP ~100.  He was bolused w/ 500cc NS and dosed w/ IV metoprolol  once, following which his HR improved to 120-130, but appeared irregular.  F/U EKG suggested [place] - carotid sinus pressure did not affect his rhythm or rate.  The pt denies sob, or chest pain at present, but did feel "uncomfortable palpitations" while in SVT.    Assessment/Plan:  Sinus tachycardia > SVT > [place]  At risk for DVT/PE - d dimer would not be helpful as will almost assuredly be elevated in this situation - can't obtain CTA presently due to renal fxn - on full  anticoag - TSH ok - lytes ok but will attempt to maximize (K+ goal 4.0 - Mg goal 2.0) - does not appear volume depleted - I have asked Cardiology to assist in his care   Severe sepsis - MSSA bacteremia  -Multiple potential initial sources: L hand cellulitis, UTI/pyelonephritis, R groin puncture/aneursym - I suspect his initial site was the R groin injection site  -Patient was hypotensive, tachycardic, with a white count of 31.3 on initial workup = sepsis  -Continue antibiotics as per ID -Will eventually need TEE when more stable clinically - Cards has been consulted   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  - to follow-up in his office in approximately 2 weeks  Possible pyelonephritis Urinalysis suggestive of infection - adequately covered with broad-spectrum empiric antibiotics - urine culture not helpful   Elevated INR with coagulopathy Felt to be due to sepsis - resolved   Hypokalemia and hypomagnesemia Recurrent - continue to replace and  follow as noted above   Chronic pain has been on pain medications for 12 years - managed by Dr. [name]?) - also takes high dose scheduled klonopin - pain currently controlled on PCA  Code Status: FULL Family Communication: no family present at time of visit today   Disposition Plan: SDU  Consultants: Hand Surgery Vascular Surgery  ID Cardiology - [place]  Procedures: 9/15 - Right thigh abscess incision and drainage + excision of infected right superficial femoral artery aneurysm 9/17  - Repeat incision and drainage of right thigh abscess  Antibiotics: Zosyn 9/10 > 9/12 Vancomycin 9/10 > 9/15 Cefazolin 9/14 >  DVT prophylaxis: IV heparin   Objective: Blood pressure 122/76, pulse 112, temperature 99.7 F (37.6 C), temperature source Oral, resp. rate 21, height  (1.905 m), weight 103.42 kg (228 lb), SpO2 96 %.  Intake/Output Summary (Last 24 hours) at 04/14/15 1139 Last data filed at 04/14/15 1133  Gross per 24 hour  Intake 4754.91 ml  Output   2975 ml  Net 1779.91 ml   Exam: General: No acute respiratory distress  Lungs: Clear to auscultation bilaterally - no wheeze  Cardiovascular: tachycardic at 135 bpm and irregular presently - no appreciable M  Abdomen: Nontender, nondistended, soft, bowel sounds positive, no rebound, no ascites, no appreciable mass Extremities: left hand in a splint - the right foot has unchanged areas of purpura - the right groin is dressed and dry at present w/ VAC in place   Data Reviewed: Basic Metabolic Panel:  Recent Labs Lab 04/08/15 0319 04/09/15 0310 04/10/15 0247 04/11/15 0426 04/11/15 1428 04/11/15 1459 04/12/15 0350 04/13/15 1254 04/14/15 0250  NA 134* 136 137 137 140 138 134* 139  --   K 4.3 3.5 3.1* 4.3 4.0 4.3 3.3* 3.3*  --   CL 107 110 110 108  --   --  107 112*  --   CO2 16* 19* 19* 17*  --   --  19* 21*  --   GLUCOSE 108* 136* 129* 94 97 109* 131* 103*  --   BUN 52* 45* 29* 21*  --   --  18 14  --   CREATININE 2.91* 2.33* 1.86* 1.63*  --   --  1.41* 1.34*  --   CALCIUM 7.6* 7.6* 7.6* 7.6*  --   --  6.8* 7.0*  --   MG 2.0  --  1.5* 1.3*  --   --  1.6*  --  1.8  PHOS  --   --   --   --   --   --   --   --  3.5    CBC:  Recent Labs Lab 04/10/15 0247 04/11/15 0426 04/11/15 1428 04/11/15 1459 04/12/15 0350 04/13/15 1254 04/14/15 0250  WBC 28.6* 47.5*  --   --  26.9* 29.5* 23.3*  NEUTROABS 26.1*  --   --   --  25.0*  --   --   HGB 8.4* 8.7* 7.5* 8.5* 7.3* 7.0* 7.9*  HCT 25.5* 26.1* 22.0* 25.0*  21.3* 21.0* 23.8*  MCV 80.2 80.8  --   --  81.0 84.0 84.4  PLT 296 PLATELET CLUMPS NOTED ON SMEAR, COUNT APPEARS ADEQUATE  --   --  277 336 399    Liver Function Tests:  Recent Labs Lab 04/09/15 0310 04/10/15 0247 04/11/15 0426 04/12/15 0350 04/13/15 1254  AST 34 37 41 22 27  ALT 12* 10*  ALKPHOS 142* 145* 151* 92 150*  BILITOT 1.5* 2.1* 1.5* 0.9 1.1  PROT 6.2* 5.5* 5.8* 5.0* 5.3*  ALBUMIN 1.3* 1.2* 1.2* 1.1* 1.1*    Coags:  Recent Labs Lab 04/07/15 1610 04/08/15 0319 04/09/15 0310 04/12/15 0350  INR 1.93* 1.61* 1.39 1.44    Recent Labs Lab 04/07/15 1610 04/09/15 0310  APTT 37 34    Cardiac Enzymes: No results for input(s): CKTOTAL, CKMB, CKMBINDEX, TROPONINI in the last 168 hours.   Recent Results (from the past 240 hour(s))  Culture, blood (routine x 2)     Status: None   Collection Time: 04/06/15 11:50 PM  Result Value Ref Range Status   Specimen Description BLOOD RIGHT HAND  Final   Special Requests   Final    BOTTLES DRAWN AEROBIC AND ANAEROBIC AER 10cc ANA 10cc   Culture  Setup Time   Final    GRAM POSITIVE COCCI IN CLUSTERS IN BOTH AEROBIC AND ANAEROBIC BOTTLES CORRECTED RESULTS CALLED TO: W DAVIS,RN AT 1151 04/08/15 BY L BENFIELD PREVIOUSLY REPORTED AS GRAM NEGATIVE COCCI AND GRAM VARIABLE COCCI    Culture   Final    STAPHYLOCOCCUS AUREUS SUSCEPTIBILITIES PERFORMED ON PREVIOUS CULTURE WITHIN THE LAST 5 DAYS. Performed at [facility]    Report Status 04/09/2015 FINAL  Final  Culture, blood (routine x 2)     Status: None   Collection Time: 04/07/15 12:05 AM  Result Value Ref Range Status   Specimen Description BLOOD RIGHT FOREARM  Final   Special Requests   Final    BOTTLES DRAWN AEROBIC AND ANAEROBIC AER 5cc ANA 5cc   Culture  Setup Time   Final    GRAM POSITIVE COCCI IN CLUSTERS IN BOTH AEROBIC AND ANAEROBIC BOTTLES CORRECTED RESULTS CALLED TO: W DAVIS,RN AT 1151 04/08/15 BY L BENFIELD PREVIOUSLY REPORTED AS GRAM NEGATIVE  COCCI AND GRAM VARIABLE COCCI    Culture   Final    STAPHYLOCOCCUS AUREUS BACILLUS SPECIES Standardized susceptibility testing for this organism is not available. CRITICAL RESULT CALLED TO, READ BACK BY AND VERIFIED WITH: Hollace Kinnier AT 1318 04/09/15 BY L BENFIELD Performed at [facility]    Report Status 04/09/2015 FINAL  Final   Organism ID, Bacteria STAPHYLOCOCCUS AUREUS  Final      Susceptibility   Staphylococcus aureus - MIC*    CIPROFLOXACIN <=0.5 SENSITIVE Sensitive     ERYTHROMYCIN <=0.25 SENSITIVE Sensitive     GENTAMICIN <=0.5 SENSITIVE Sensitive     OXACILLIN <=0.25 SENSITIVE Sensitive     TETRACYCLINE <=1 SENSITIVE Sensitive     VANCOMYCIN <=0.5 SENSITIVE Sensitive     TRIMETH/SULFA <=10 SENSITIVE Sensitive     CLINDAMYCIN <=0.25 SENSITIVE Sensitive     RIFAMPIN <=0.5 SENSITIVE Sensitive     Inducible Clindamycin NEGATIVE Sensitive     * STAPHYLOCOCCUS AUREUS  MRSA PCR Screening     Status: None   Collection Time: 04/07/15  6:30 AM  Result Value Ref Range Status   MRSA by PCR NEGATIVE NEGATIVE Final    Comment:        The GeneXpert MRSA Assay (FDA approved for NASAL specimens only), is one component of a comprehensive MRSA colonization surveillance program. It is not intended to diagnose MRSA infection nor to guide or monitor treatment for MRSA infections.   Urine culture     Status: None   Collection Time: 04/07/15  7:55 AM  Result Value Ref Range Status   Specimen Description URINE, RANDOM  Final   Special Requests NONE  Final   Culture  NO GROWTH 1 DAY  Final   Report Status 04/08/2015 FINAL  Final  Culture, blood (routine x 2)     Status: None   Collection Time: 04/08/15  6:21 PM  Result Value Ref Range Status   Specimen Description BLOOD RIGHT ANTECUBITAL  Final   Special Requests BOTTLES DRAWN AEROBIC ONLY 4CC  Final   Culture NO GROWTH 5 DAYS  Final   Report Status 04/13/2015 FINAL  Final  Culture, routine-abscess     Status: None    Collection Time: 04/11/15  1:54 PM  Result Value Ref Range Status   Specimen Description ABSCESS RIGHT THIGH  Final   Special Requests PATIENT ON FOLLOWING ANCEF VANCOMYCIN  Final   Gram Stain   Final    FEW WBC PRESENT, PREDOMINANTLY PMN NO SQUAMOUS EPITHELIAL CELLS SEEN RARE GRAM POSITIVE COCCI IN PAIRS IN CLUSTERS Performed at [facility]    Culture   Final    FEW STAPHYLOCOCCUS AUREUS Note: RIFAMPIN AND GENTAMICIN SHOULD NOT BE USED AS SINGLE DRUGS FOR TREATMENT OF STAPH INFECTIONS. Performed at [facility]    Report Status 04/14/2015 FINAL  Final   Organism ID, Bacteria STAPHYLOCOCCUS AUREUS  Final      Susceptibility   Staphylococcus aureus - MIC*    CLINDAMYCIN <=0.25 SENSITIVE Sensitive     ERYTHROMYCIN <=0.25 SENSITIVE Sensitive     GENTAMICIN <=0.5 SENSITIVE Sensitive     LEVOFLOXACIN 0.25 SENSITIVE Sensitive     OXACILLIN 0.5 SENSITIVE Sensitive     RIFAMPIN <=0.5 SENSITIVE Sensitive     TRIMETH/SULFA <=10 SENSITIVE Sensitive     VANCOMYCIN 1 SENSITIVE Sensitive     TETRACYCLINE <=1 SENSITIVE Sensitive     MOXIFLOXACIN <=0.25 SENSITIVE Sensitive     * FEW STAPHYLOCOCCUS AUREUS     Studies:   Recent x-ray studies have been reviewed in detail by the Attending Physician  Scheduled Meds:  Scheduled Meds: . atenolol  12.5 mg Oral Daily  .  ceFAZolin (ANCEF) IV  2 g Intravenous 3 times per day  . docusate sodium  100 mg Oral BID  . feeding supplement (ENSURE ENLIVE)  237 mL Oral BID BM  . morphine  90 mg Oral 2 times per day  . morphine   Intravenous 6 times per day  . naloxegol oxalate  25 mg Oral Daily  . potassium chloride  40 mEq Oral Daily  . sodium chloride  3 mL Intravenous Q12H  . Tdap  0.5 mL Intramuscular Once  . venlafaxine XR  37.5 mg Oral Q breakfast    Time spent on care of this patient: 35 mins   MCCLUNG,JEFFREY T , MD   [facility] Office  [phone] Pager - Text Page per Loretha Stapler as per below:  On-Call/Text  Page:      [email or web address]      password [email or web address]  If 7PM-7AM, please contact night-coverage [email or web address] Password [email or web address] 04/14/2015, 11:39 AM   LOS: 7 days

## 2015-04-14 NOTE — Progress Notes (Signed)
Subjective  - POD #1, status post reexploration of right groin wound yesterday  Patient states that his pain is controlled   Physical Exam:  Severely edematous right leg which is tender. Continues to have areas of mottling on the right foot Wound VAC in place and functioning properly       Assessment/Plan:    Anticipate return to the operating room tomorrow for wound exploration and dressing change Continue with antibody coverage Nothing by mouth after midnight Continue to monitor hemoglobin, may require additional transfusions.  Durene Cal 04/14/2015 12:06 PM --  Filed Vitals:   04/14/15 1133  BP:   Pulse:   Temp: 99.7 F (37.6 C)  Resp:     Intake/Output Summary (Last 24 hours) at 04/14/15 1206 Last data filed at 04/14/15 1133  Gross per 24 hour  Intake 4514.91 ml  Output   2975 ml  Net 1539.91 ml     Laboratory CBC    Component Value Date/Time   WBC 23.3* 04/14/2015 0250   HGB 7.9* 04/14/2015 0250   HCT 23.8* 04/14/2015 0250   PLT 399 04/14/2015 0250    BMET    Component Value Date/Time   NA 139 04/13/2015 1254   K 3.3* 04/13/2015 1254   CL 112* 04/13/2015 1254   CO2 21* 04/13/2015 1254   GLUCOSE 103* 04/13/2015 1254   BUN 14 04/13/2015 1254   CREATININE 1.34* 04/13/2015 1254   CALCIUM 7.0* 04/13/2015 1254   GFRNONAA 58* 04/13/2015 1254   GFRAA >60 04/13/2015 1254    COAG Lab Results  Component Value Date   INR 1.44 04/12/2015   INR 1.39 04/09/2015   INR 1.61* 04/08/2015   No results found for: PTT  Antibiotics Anti-infectives    Start     Dose/Rate Route Frequency Ordered Stop   04/12/15 1130  vancomycin (VANCOCIN) 1,250 mg in sodium chloride 0.9 % 250 mL IVPB  Status:  Discontinued     1,250 mg 166.7 mL/hr over 90 Minutes Intravenous Every 12 hours 04/12/15 1101 04/12/15 1203   04/11/15 1415  polymyxin B 500,000 Units, bacitracin 50,000 Units in sodium chloride irrigation 0.9 % 500 mL irrigation  Status:  Discontinued        As needed 04/11/15 1416 04/11/15 1522   04/10/15 2200  ceFAZolin (ANCEF) IVPB 2 g/50 mL premix     2 g 100 mL/hr over 30 Minutes Intravenous 3 times per day 04/10/15 1756     04/08/15 0900  vancomycin (VANCOCIN) 1,250 mg in sodium chloride 0.9 % 250 mL IVPB  Status:  Discontinued     1,250 mg 166.7 mL/hr over 90 Minutes Intravenous Every 24 hours 04/07/15 0835 04/12/15 1101   04/07/15 1100  piperacillin-tazobactam (ZOSYN) IVPB 3.375 g  Status:  Discontinued     3.375 g 12.5 mL/hr over 240 Minutes Intravenous 3 times per day 04/07/15 0834 04/09/15 1042   04/07/15 0845  vancomycin (VANCOCIN) 1,250 mg in sodium chloride 0.9 % 250 mL IVPB     1,250 mg 166.7 mL/hr over 90 Minutes Intravenous  Once 04/07/15 0834 04/07/15 1101   04/07/15 0730  piperacillin-tazobactam (ZOSYN) IVPB 3.375 g  Status:  Discontinued     3.375 g 100 mL/hr over 30 Minutes Intravenous  Once 04/07/15 0723 04/07/15 0727   04/07/15 0730  vancomycin (VANCOCIN) IVPB 1000 mg/200 mL premix  Status:  Discontinued     1,000 mg 200 mL/hr over 60 Minutes Intravenous  Once 04/07/15 0723 04/07/15 0727   04/07/15 0724  famciclovir [facility]) tablet 500 mg     500 mg Oral 2 times daily PRN 04/07/15 0729     04/07/15 0215  vancomycin (VANCOCIN) IVPB 1000 mg/200 mL premix     1,000 mg 200 mL/hr over 60 Minutes Intravenous  Once 04/07/15 0212 04/07/15 0311   04/07/15 0215  piperacillin-tazobactam (ZOSYN) IVPB 3.375 g     3.375 g 100 mL/hr over 30 Minutes Intravenous  Once 04/07/15 0212 04/07/15 0503       V. Charlena Cross, M.D. [facility] Office: [phone] Pager:  [phone]

## 2015-04-14 NOTE — Progress Notes (Signed)
ANTICOAGULATION CONSULT NOTE - Follow Up Consult  Pharmacy Consult for Heparin  Indication: Thromboembolism to Right Foot  Allergies  Allergen Reactions  . Imitrex [Sumatriptan] Other (See Comments)    Chest pain, 2004 (tolerates 50 mg prn)  . Verapamil Other (See Comments)    Causes pvc's    Patient Measurements: Height:  (190.5 cm) Weight: 228 lb (103.42 kg) IBW/kg (Calculated) : 84.5 Heparin dosing weight: 103 kg  Vital Signs: Temp: 100 F (37.8 C) (09/18 1923) Temp Source: Oral (09/18 1923) BP: 106/58 mmHg (09/18 1925) Pulse Rate: 108 (09/18 1555)  Labs:  Recent Labs  04/12/15 0350 04/13/15 1254  04/14/15 0250 04/14/15 1130 04/14/15 2319  HGB 7.3* 7.0*  --  7.9*  --   --   HCT 21.3* 21.0*  --  23.8*  --   --   PLT 277 336  --  399  --   --   LABPROT 17.6*  --   --   --   --   --   INR 1.44  --   --   --   --   --   HEPARINUNFRC <0.10*  --   < > <0.10* <0.10* <0.10*  CREATININE 1.41* 1.34*  --   --   --   --   < > = values in this interval not displayed.  Estimated Creatinine Clearance: 80.2 mL/min (by C-G formula based on Cr of 1.34).   Medications:  Scheduled:  .  ceFAZolin (ANCEF) IV  2 g Intravenous 3 times per day  . docusate sodium  100 mg Oral BID  . feeding supplement (ENSURE ENLIVE)  237 mL Oral BID BM  . metoprolol tartrate  25 mg Oral BID  . morphine  90 mg Oral 2 times per day  . morphine   Intravenous 6 times per day  . naloxegol oxalate  25 mg Oral Daily  . potassium chloride  40 mEq Oral BID  . sodium chloride  3 mL Intravenous Q12H  . Tdap  0.5 mL Intramuscular Once  . venlafaxine XR  37.5 mg Oral Q breakfast   Infusions:  . sodium chloride 30 mL/hr at 04/14/15 1700  . heparin 2,300 Units/hr (04/14/15 1800)   PRN: acetaminophen **OR** acetaminophen, clonazePAM, diphenhydrAMINE **OR** diphenhydrAMINE, famciclovir, levalbuterol, naloxone **AND** sodium chloride, ondansetron (ZOFRAN) IV, oxyCODONE-acetaminophen,  trifluridine  Assessment: 58 YOM admitted with MSSA bacteremia complicated by a 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  Pharmacy consulted for tx dose heparin for right foot thromboembolism secondary to R groin aneurysm.  INR was 2.26 on 04/07/15, but had been taking Xarelto 10 mg daily, which he had at home from recent left knee revision. Xarelto had been stopped in May due to bleeding into knee per patient, but he had resumed taking Xarelto 10 mg daily 4 days prior to admission. 9/14: IV heparin started for suspected thromboembolism 9/16 IV heparin stopped, Lovenox for DVT ppx started (last dose 9/16 1351).  Per vasc surgery note: Washout of right thigh abscess cavity revealed additional septae which lead to a medial accumulation of infected hematoma. Additionally, the proximal superficial femoral artery remains intact.  The vein will be at higher risk of clotting.  MD believes DVT/PE risk exceeds the bleeding risk.   Hgb low 7.3, Plts 277.  Due to bleeding in wound vac as noted by nursing will not bolus patient.    F/u HL remains undetectable after changing to central line access on heparin 2300  units/hr. Nurse reports no issues with infusion or bleeding since changing to central line.  Goal of Therapy:  Heparin level 0.3-0.7 units/ml Monitor platelets by anticoagulation protocol: Yes   Plan:  Bolus 4000 units and increase rate to 2800 units/hr 6 hr HL Daily CBC/HL Monitor for s/sx of bleeding   Arlean Hopping. Newman Pies, PharmD Clinical Pharmacist Pager [phone]  04/14/2015 11:51 PM

## 2015-04-15 ENCOUNTER — Encounter (HOSPITAL_COMMUNITY): Payer: Self-pay | Admitting: Anesthesiology

## 2015-04-15 ENCOUNTER — Inpatient Hospital Stay (HOSPITAL_COMMUNITY): Payer: Medicaid Other

## 2015-04-15 ENCOUNTER — Inpatient Hospital Stay (HOSPITAL_COMMUNITY): Payer: Medicaid Other | Admitting: Anesthesiology

## 2015-04-15 DIAGNOSIS — I339 Acute and subacute endocarditis, unspecified: Secondary | ICD-10-CM

## 2015-04-15 LAB — CBC
HCT: 20.5 % — ABNORMAL LOW (ref 39.0–52.0)
HCT: 25.5 % — ABNORMAL LOW (ref 39.0–52.0)
HEMOGLOBIN: 6.7 g/dL — AB (ref 13.0–17.0)
HEMOGLOBIN: 8.2 g/dL — AB (ref 13.0–17.0)
MCH: 27.7 pg (ref 26.0–34.0)
MCH: 27 pg (ref 26.0–34.0)
MCHC: 32.7 g/dL (ref 30.0–36.0)
MCHC: 32.2 g/dL (ref 30.0–36.0)
MCV: 84.7 fL (ref 78.0–100.0)
MCV: 83.9 fL (ref 78.0–100.0)
PLATELETS: 484 10*3/uL — AB (ref 150–400)
Platelets: 432 10*3/uL — ABNORMAL HIGH (ref 150–400)
RBC: 2.42 MIL/uL — AB (ref 4.22–5.81)
RBC: 3.04 MIL/uL — AB (ref 4.22–5.81)
RDW: 17.1 % — ABNORMAL HIGH (ref 11.5–15.5)
RDW: 17 % — ABNORMAL HIGH (ref 11.5–15.5)
WBC: 18 10*3/uL — AB (ref 4.0–10.5)
WBC: 18.1 10*3/uL — ABNORMAL HIGH (ref 4.0–10.5)

## 2015-04-15 LAB — TYPE AND SCREEN
ABO/RH(D): A POS
ANTIBODY SCREEN: NEGATIVE
UNIT DIVISION: 0
Unit division: 0
Unit division: 0

## 2015-04-15 LAB — PREPARE RBC (CROSSMATCH)

## 2015-04-15 LAB — HEPARIN LEVEL (UNFRACTIONATED)

## 2015-04-15 LAB — RENAL FUNCTION PANEL
ALBUMIN: 1 g/dL — AB (ref 3.5–5.0)
Anion gap: 5 (ref 5–15)
BUN: 12 mg/dL (ref 6–20)
CHLORIDE: 108 mmol/L (ref 101–111)
CO2: 23 mmol/L (ref 22–32)
CREATININE: 1.2 mg/dL (ref 0.61–1.24)
Calcium: 7.3 mg/dL — ABNORMAL LOW (ref 8.9–10.3)
Glucose, Bld: 105 mg/dL — ABNORMAL HIGH (ref 65–99)
PHOSPHORUS: 3.2 mg/dL (ref 2.5–4.6)
Potassium: 4.2 mmol/L (ref 3.5–5.1)
Sodium: 136 mmol/L (ref 135–145)

## 2015-04-15 LAB — MAGNESIUM: MAGNESIUM: 1.7 mg/dL (ref 1.7–2.4)

## 2015-04-15 MED ORDER — HYDROMORPHONE HCL 1 MG/ML IJ SOLN
INTRAMUSCULAR | Status: AC
  Filled 2015-04-15: qty 2

## 2015-04-15 MED ORDER — ACETAMINOPHEN 325 MG PO TABS: 325.0000 mg | ORAL_TABLET | ORAL | Status: DC | PRN

## 2015-04-15 MED ORDER — FENTANYL CITRATE (PF) 250 MCG/5ML IJ SOLN
INTRAMUSCULAR | Status: AC
  Filled 2015-04-15: qty 5

## 2015-04-15 MED ORDER — SODIUM CHLORIDE 0.9 % IV SOLN: Freq: Once | INTRAVENOUS | Status: DC

## 2015-04-15 MED ORDER — HYDROMORPHONE HCL 1 MG/ML IJ SOLN
INTRAMUSCULAR | Status: AC
  Filled 2015-04-15: qty 1

## 2015-04-15 MED ORDER — ONDANSETRON HCL 4 MG/2ML IJ SOLN
INTRAMUSCULAR | Status: DC | PRN
  Administered 2015-04-15: 4 mg via INTRAVENOUS

## 2015-04-15 MED ORDER — PROPOFOL 10 MG/ML IV BOLUS
INTRAVENOUS | Status: DC | PRN
  Administered 2015-04-15: 30 mg via INTRAVENOUS
  Administered 2015-04-15: 50 mg via INTRAVENOUS
  Administered 2015-04-15: 100 mg via INTRAVENOUS
  Administered 2015-04-15: 70 mg via INTRAVENOUS

## 2015-04-15 MED ORDER — MIDAZOLAM HCL 2 MG/2ML IJ SOLN
INTRAMUSCULAR | Status: AC
  Filled 2015-04-15: qty 2

## 2015-04-15 MED ORDER — HYDROMORPHONE HCL 1 MG/ML IJ SOLN
0.5000 mg | INTRAMUSCULAR | Status: AC | PRN
  Administered 2015-04-15 (×6): 0.5 mg via INTRAVENOUS

## 2015-04-15 MED ORDER — MIDAZOLAM HCL 5 MG/5ML IJ SOLN
INTRAMUSCULAR | Status: DC | PRN
  Administered 2015-04-15: 2 mg via INTRAVENOUS

## 2015-04-15 MED ORDER — SODIUM CHLORIDE 0.9 % IV SOLN
Freq: Once | INTRAVENOUS | Status: AC
  Administered 2015-04-15: 09:00:00 via INTRAVENOUS

## 2015-04-15 MED ORDER — HYDROMORPHONE HCL 1 MG/ML IJ SOLN
0.2500 mg | INTRAMUSCULAR | Status: DC | PRN
  Administered 2015-04-15 (×4): 0.5 mg via INTRAVENOUS

## 2015-04-15 MED ORDER — OXYCODONE HCL 5 MG PO TABS
5.0000 mg | ORAL_TABLET | Freq: Once | ORAL | Status: AC | PRN
  Administered 2015-04-15: 5 mg via ORAL

## 2015-04-15 MED ORDER — OXYCODONE HCL 5 MG PO TABS
ORAL_TABLET | ORAL | Status: AC
  Filled 2015-04-15: qty 1

## 2015-04-15 MED ORDER — PROPOFOL 10 MG/ML IV BOLUS
INTRAVENOUS | Status: AC
  Filled 2015-04-15: qty 20

## 2015-04-15 MED ORDER — 0.9 % SODIUM CHLORIDE (POUR BTL) OPTIME
TOPICAL | Status: DC | PRN
  Administered 2015-04-15: 1000 mL

## 2015-04-15 MED ORDER — FENTANYL CITRATE (PF) 100 MCG/2ML IJ SOLN
INTRAMUSCULAR | Status: DC | PRN
  Administered 2015-04-15: 100 ug via INTRAVENOUS
  Administered 2015-04-15 (×3): 50 ug via INTRAVENOUS

## 2015-04-15 MED ORDER — SUCCINYLCHOLINE CHLORIDE 20 MG/ML IJ SOLN
INTRAMUSCULAR | Status: DC | PRN
  Administered 2015-04-15: 60 mg via INTRAVENOUS

## 2015-04-15 MED ORDER — HEPARIN BOLUS VIA INFUSION
4000.0000 [IU] | Freq: Once | INTRAVENOUS | Status: AC
  Administered 2015-04-15: 4000 [IU] via INTRAVENOUS
  Filled 2015-04-15: qty 4000

## 2015-04-15 MED ORDER — OXYCODONE HCL 5 MG/5ML PO SOLN: 5.0000 mg | Freq: Once | ORAL | Status: AC | PRN

## 2015-04-15 MED ORDER — ACETAMINOPHEN 160 MG/5ML PO SOLN
325.0000 mg | ORAL | Status: DC | PRN
  Filled 2015-04-15: qty 20.3

## 2015-04-15 MED ORDER — GLYCOPYRROLATE 0.2 MG/ML IJ SOLN
INTRAMUSCULAR | Status: DC | PRN
Start: 2015-04-15 — End: 2015-04-15
  Administered 2015-04-15: 0.3 mg via INTRAVENOUS

## 2015-04-15 MED ORDER — ROCURONIUM BROMIDE 100 MG/10ML IV SOLN
INTRAVENOUS | Status: DC | PRN
  Administered 2015-04-15: 20 mg via INTRAVENOUS

## 2015-04-15 MED ORDER — ARTIFICIAL TEARS OP OINT
TOPICAL_OINTMENT | OPHTHALMIC | Status: DC | PRN
  Administered 2015-04-15: 1 via OPHTHALMIC

## 2015-04-15 MED ORDER — THROMBIN 20000 UNITS EX SOLR
CUTANEOUS | Status: AC
  Filled 2015-04-15: qty 20000

## 2015-04-15 MED ORDER — NEOSTIGMINE METHYLSULFATE 10 MG/10ML IV SOLN
INTRAVENOUS | Status: DC | PRN
  Administered 2015-04-15: 2.5 mg via INTRAVENOUS

## 2015-04-15 MED ORDER — LACTATED RINGERS IV SOLN
INTRAVENOUS | Status: DC | PRN
  Administered 2015-04-15: 09:00:00 via INTRAVENOUS

## 2015-04-15 NOTE — Progress Notes (Signed)
Dr Imogene Burn in and asked him about Heparin drip he states its ok to leave it running.

## 2015-04-15 NOTE — Progress Notes (Signed)
18lm of morphine PCA wasted in sink prior to pt going to OR, Helmut Muster RN witnessed waste.

## 2015-04-15 NOTE — Transfer of Care (Signed)
Immediate Anesthesia Transfer of Care Note  Patient: [name]  Procedure(s) Performed: Procedure(s): WASHOUT THIGH ABSCESS/ VAC DRESSING CHANGE (Right)  Patient Location: PACU  Anesthesia Type:General  Level of Consciousness: awake, alert , oriented, patient cooperative and responds to stimulation  Airway & Oxygen Therapy: Patient Spontanous Breathing and Patient connected to face mask oxygen  Post-op Assessment: Report given to RN, Post -op Vital signs reviewed and stable, Patient moving all extremities and Patient moving all extremities X 4  Post vital signs: Reviewed and stable  Last Vitals:  Filed Vitals:   04/15/15 0813  BP:   Pulse: 108  Temp:   Resp: 22    Complications: No apparent anesthesia complications

## 2015-04-15 NOTE — Progress Notes (Signed)
[name], [name] DOA: 04/06/2015 PCP: [name], MD  Admit HPI / Brief Narrative: 59 y.o. Male with PUD, HLD, history of MRSA abscesses, peripheral neuropathy, and chronic pain who presented to [facility] with severe left hand pain. He was found to be septic with acute kidney injury and left hand cellulitis. After admission he was noted to have a dusky swollen right foot. [name] reported that approximately 2 weeks prior he started feeling poorly and eating less. 9/7 he could not feel his right foot and fell as a result. He landed on his left wrist, shoulder, and back side. Since then his left hand has been very painful and his wrist and arm had begun to swell. He has developed thick erythematous streaking on his left arm.    In [facility]: Lactic acid was 4.16, INR is 2.26 (not on anticoag), PTT is 24.7, WBC is 31.3, mag 1.3, potassium 2.7, procalcitonin 9.72. BP was 80/68 He received 3L of IV bolus in [facility].  While [name] was in [place] a large pulsating femoral artery aneurysm was found. Reportedly [name] injected his groin with testosterone 3 weeks ago.  HPI/Subjective: [name] is seen back in his room having returned from [facility].  He has no new complaints.  He is alert though slightly confused which is attributable to his sedation/pain medication.  He denies chest pain nausea vomiting or abdominal pain.  Assessment/Plan:  Sinus tachycardia > SVT > [place]  At risk for DVT/PE - d dimer would not be helpful as will almost assuredly be elevated in this situation - can't obtain CTA presently due to renal fxn - on full anticoag - TSH ok - lytes ok but will attempt to maximize (K+ goal 4.0 - Mg goal 2.0) - does not appear volume depleted - Cardiology has completed an evaluation and recommends adenosine 6 mg should [name] suffer another episode of atrial tachycardia  Severe sepsis - MSSA  bacteremia  -Multiple potential initial sources: L hand cellulitis, UTI/pyelonephritis, R groin puncture/aneursym - I suspect his initial site was Craig R groin injection site  -[name] was hypotensive, tachycardic, with a white count of 31.3 on initial workup = sepsis  -Continue antibiotics as per ID -ID suggest will need TEE when more stable clinically - Cards feels that TTE will suffice - we'll follow-up on TTE results and decide at that time  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 [name] injected his right groin with testosterone 3 weeks prior to admit - required emergent surgical I&D 9/15 - ongoing care per vascular surgery - went back to OR 9/17 and 9/19 - now on [facility] - Plastics consulted for eventual wound closure   Acute blood loss anemia Due to infected vascular region s/p multiple surgeries + sepsis - transfuse as needed to keep Hgb 7.0 or >  Acute renal failure Creatinine is 0.9 at baseline - 4.4 on admission - continues to improve/approaching normal    Left hand cellulitis with radius fracture - history of MRSA abscess Evaluated by [name]  - to follow-up in his office in approximately 2 weeks  Possible pyelonephritis Urinalysis suggestive of infection - adequately covered with broad-spectrum empiric antibiotics - urine culture not helpful   Elevated INR with coagulopathy Felt to be due to sepsis - resolved   Hypokalemia and hypomagnesemia Recurrent - replaced to stable levels at this time  Chronic pain has been on pain medications for 12 years - managed by Dr. [name] [facility] - also takes high dose scheduled klonopin - pain currently controlled on PCA  Code Status: FULL Family Communication: Spoke with [name] and wife at bedside Disposition Plan: SDU  Consultants: Hand Surgery Vascular Surgery  ID Cardiology - [place]  Procedures: 9/15 - Right thigh abscess incision and drainage + excision of infected right  superficial femoral artery aneurysm 9/17 - Repeat incision and drainage of right thigh abscess  Antibiotics: Zosyn 9/10 > 9/12 Vancomycin 9/10 > 9/15 Cefazolin 9/14 >  DVT prophylaxis: IV heparin   Objective: Blood pressure 137/79, pulse 112, temperature 98.3 F (36.8 C), temperature source Oral, resp. rate 18, height  (1.905 m), weight 103.42 kg (228 lb), SpO2 96 %.  Intake/Output Summary (Last 24 hours) at 04/15/15 1229 Last data filed at 04/15/15 1126  Gross per 24 hour  Intake 2598.5 ml  Output   4025 ml  Net -1426.5 ml   Exam: General: No acute respiratory distress - alert   Lungs: Clear to auscultation bilaterally - no wheeze  Cardiovascular: sinus tachy at 110 - no appreciable M  Abdomen: Nontender, nondistended, soft, bowel sounds positive, no rebound, no ascites, no appreciable mass Extremities: left hand in a splint - Craig right foot has persistent areas of deep purple discoloration/ischemia - Craig right groin is dressed and dry at present   Data Reviewed: Basic Metabolic Panel:  Recent Labs Lab 04/10/15 0247 04/11/15 0426 04/11/15 1428 04/11/15 1459 04/12/15 0350 04/13/15 1254 04/14/15 0250 04/15/15 0413  NA 137 137 140 138 134* 139  --  136  K 3.1* 4.3 4.0 4.3 3.3* 3.3*  --  4.2  CL 110 108  --   --  107 112*  --  108  CO2 19* 17*  --   --  19* 21*  --  23  GLUCOSE 129* 94 97 109* 131* 103*  --  105*  BUN 29* 21*  --   --  18 14  --  12  CREATININE 1.86* 1.63*  --   --  1.41* 1.34*  --  1.20  CALCIUM 7.6* 7.6*  --   --  6.8* 7.0*  --  7.3*  MG 1.5* 1.3*  --   --  1.6*  --  1.8 1.7  PHOS  --   --   --   --   --   --  3.5 3.2    CBC:  Recent Labs Lab 04/10/15 0247 04/11/15 0426  04/11/15 1459 04/12/15 0350 04/13/15 1254 04/14/15 0250 04/15/15 0413  WBC 28.6* 47.5*  --   --  26.9* 29.5* 23.3* 18.0*  NEUTROABS 26.1*  --   --   --  25.0*  --   --   --   HGB 8.4* 8.7*  < > 8.5* 7.3* 7.0* 7.9* 6.7*  HCT 25.5* 26.1*  < > 25.0* 21.3* 21.0*  23.8* 20.5*  MCV 80.2 80.8  --   --  81.0 84.0 84.4 84.7  PLT 296 PLATELET CLUMPS NOTED ON SMEAR, COUNT APPEARS ADEQUATE  --   --  277 336 399 432*  < > = values in this interval not displayed.  Liver Function Tests:  Recent Labs Lab 04/09/15 0310 04/10/15 0247 04/11/15 0426 04/12/15 0350 04/13/15 1254 04/15/15 0413  AST 34 37 41 22 27  --   ALT 12* 10*  --   ALKPHOS 142* 145* 151* 92 150*  --  BILITOT 1.5* 2.1* 1.5* 0.9 1.1  --   PROT 6.2* 5.5* 5.8* 5.0* 5.3*  --   ALBUMIN 1.3* 1.2* 1.2* 1.1* 1.1* 1.0*    Coags:  Recent Labs Lab 04/09/15 0310 04/12/15 0350  INR 1.39 1.44    Recent Labs Lab 04/09/15 0310  APTT 34    Recent Results (from Craig past 240 hour(s))  Culture, blood (routine x 2)     Status: None   Collection Time: 04/06/15 11:50 PM  Result Value Ref Range Status   Specimen Description BLOOD RIGHT HAND  Final   Special Requests   Final    BOTTLES DRAWN AEROBIC AND ANAEROBIC AER 10cc ANA 10cc   Culture  Setup Time   Final    GRAM POSITIVE COCCI IN CLUSTERS IN BOTH AEROBIC AND ANAEROBIC BOTTLES CORRECTED RESULTS CALLED TO: W DAVIS,RN AT 1151 04/08/15 BY L BENFIELD PREVIOUSLY REPORTED AS GRAM NEGATIVE COCCI AND GRAM VARIABLE COCCI    Culture   Final    STAPHYLOCOCCUS AUREUS SUSCEPTIBILITIES PERFORMED ON PREVIOUS CULTURE WITHIN [facility] LAST 5 DAYS. Performed at [facility]    Report Status 04/09/2015 FINAL  Final  Culture, blood (routine x 2)     Status: None   Collection Time: 04/07/15 12:05 AM  Result Value Ref Range Status   Specimen Description BLOOD RIGHT FOREARM  Final   Special Requests   Final    BOTTLES DRAWN AEROBIC AND ANAEROBIC AER 5cc ANA 5cc   Culture  Setup Time   Final    GRAM POSITIVE COCCI IN CLUSTERS IN BOTH AEROBIC AND ANAEROBIC BOTTLES CORRECTED RESULTS CALLED TO: W DAVIS,RN AT 1151 04/08/15 BY L BENFIELD PREVIOUSLY REPORTED AS GRAM NEGATIVE COCCI AND GRAM VARIABLE COCCI    Culture   Final    STAPHYLOCOCCUS  AUREUS BACILLUS SPECIES Standardized susceptibility testing for this organism is not available. CRITICAL RESULT CALLED TO, READ BACK BY AND VERIFIED WITH: Hollace Kinnier AT 1318 04/09/15 BY L BENFIELD Performed at [facility]    Report Status 04/09/2015 FINAL  Final   Organism ID, Bacteria STAPHYLOCOCCUS AUREUS  Final      Susceptibility   Staphylococcus aureus - MIC*    CIPROFLOXACIN <=0.5 SENSITIVE Sensitive     ERYTHROMYCIN <=0.25 SENSITIVE Sensitive     GENTAMICIN <=0.5 SENSITIVE Sensitive     OXACILLIN <=0.25 SENSITIVE Sensitive     TETRACYCLINE <=1 SENSITIVE Sensitive     VANCOMYCIN <=0.5 SENSITIVE Sensitive     TRIMETH/SULFA <=10 SENSITIVE Sensitive     CLINDAMYCIN <=0.25 SENSITIVE Sensitive     RIFAMPIN <=0.5 SENSITIVE Sensitive     Inducible Clindamycin NEGATIVE Sensitive     * STAPHYLOCOCCUS AUREUS  MRSA PCR Screening     Status: None   Collection Time: 04/07/15  6:30 AM  Result Value Ref Range Status   MRSA by PCR NEGATIVE NEGATIVE Final    Comment:        [facility] GeneXpert MRSA Assay (FDA approved for NASAL specimens only), is one component of a comprehensive MRSA colonization surveillance program. It is not intended to diagnose MRSA infection nor to guide or monitor treatment for MRSA infections.   Urine culture     Status: None   Collection Time: 04/07/15  7:55 AM  Result Value Ref Range Status   Specimen Description URINE, RANDOM  Final   Special Requests NONE  Final   Culture NO GROWTH 1 DAY  Final   Report Status 04/08/2015 FINAL  Final  Culture, blood (routine x  2)     Status: None   Collection Time: 04/08/15  6:21 PM  Result Value Ref Range Status   Specimen Description BLOOD RIGHT ANTECUBITAL  Final   Special Requests BOTTLES DRAWN AEROBIC ONLY 4CC  Final   Culture NO GROWTH 5 DAYS  Final   Report Status 04/13/2015 FINAL  Final  Culture, routine-abscess     Status: None   Collection Time: 04/11/15  1:54 PM  Result Value Ref Range Status    Specimen Description ABSCESS RIGHT THIGH  Final   Special Requests [name] ON FOLLOWING ANCEF VANCOMYCIN  Final   Gram Stain   Final    FEW WBC PRESENT, PREDOMINANTLY PMN NO SQUAMOUS EPITHELIAL CELLS SEEN RARE GRAM POSITIVE COCCI IN PAIRS IN CLUSTERS Performed at [facility]    Culture   Final    FEW STAPHYLOCOCCUS AUREUS Note: RIFAMPIN AND GENTAMICIN SHOULD NOT BE USED AS SINGLE DRUGS FOR TREATMENT OF STAPH INFECTIONS. Performed at [facility]    Report Status 04/14/2015 FINAL  Final   Organism ID, Bacteria STAPHYLOCOCCUS AUREUS  Final      Susceptibility   Staphylococcus aureus - MIC*    CLINDAMYCIN <=0.25 SENSITIVE Sensitive     ERYTHROMYCIN <=0.25 SENSITIVE Sensitive     GENTAMICIN <=0.5 SENSITIVE Sensitive     LEVOFLOXACIN 0.25 SENSITIVE Sensitive     OXACILLIN 0.5 SENSITIVE Sensitive     RIFAMPIN <=0.5 SENSITIVE Sensitive     TRIMETH/SULFA <=10 SENSITIVE Sensitive     VANCOMYCIN 1 SENSITIVE Sensitive     TETRACYCLINE <=1 SENSITIVE Sensitive     MOXIFLOXACIN <=0.25 SENSITIVE Sensitive     * FEW STAPHYLOCOCCUS AUREUS     Studies:   Recent x-ray studies have been reviewed in detail by Craig Attending Physician  Scheduled Meds:  Scheduled Meds: . sodium chloride   Intravenous Once  .  ceFAZolin (ANCEF) IV  2 g Intravenous 3 times per day  . docusate sodium  100 mg Oral BID  . feeding supplement (ENSURE ENLIVE)  237 mL Oral BID BM  . HYDROmorphone      . HYDROmorphone      . HYDROmorphone      . HYDROmorphone      . metoprolol tartrate  25 mg Oral BID  . morphine  90 mg Oral 2 times per day  . morphine   Intravenous 6 times per day  . naloxegol oxalate  25 mg Oral Daily  . oxyCODONE      . potassium chloride  40 mEq Oral BID  . sodium chloride  3 mL Intravenous Q12H  . Tdap  0.5 mL Intramuscular Once  . venlafaxine XR  37.5 mg Oral Q breakfast    Time spent on care of this [name]: 35 mins   MCCLUNG,JEFFREY T , MD   [facility] Office  [phone] Pager - Text Page per Loretha Stapler as per below:  On-Call/Text Page:      [email or web address]      password [email or web address]  If 7PM-7AM, please contact night-coverage [email or web address] Password [email or web address] 04/15/2015, 12:29 PM   LOS: 8 days

## 2015-04-15 NOTE — Progress Notes (Signed)
   Received consult from Dr. Imogene Burn. Discussed benefit of gracilis flap for coverage vasculature in setting of infection, history aneurysm. Booked as add on for 04/19/15. Patient currently in PACU post op- will do formal consult with patient and review surgery am 04/16/15.  Glenna Fellows, MD [facility] [phone]

## 2015-04-15 NOTE — Progress Notes (Signed)
Received pt and placed him on tele to monitor.

## 2015-04-15 NOTE — Progress Notes (Signed)
   Daily Progress Note  Wound looks cleaner but will likely need a muscle flap to cover the femoral vein.  I have contacted Plastics to evaluate him for such.  Earliest they can accommodate him is Friday, so will plan for return to OR on Wednesday for another washout.  Leonides Sake, MD [facility] [place] Office: [phone] Pager: [phone]  04/15/2015, 10:29 AM

## 2015-04-15 NOTE — Anesthesia Procedure Notes (Signed)
Procedure Name: Intubation Date/Time: 04/15/2015 9:16 AM Performed by: Wray Kearns A Pre-anesthesia Checklist: Patient identified, Emergency Drugs available, Suction available, Patient being monitored and Timeout performed Patient Re-evaluated:Patient Re-evaluated prior to inductionOxygen Delivery Method: Circle system utilized Preoxygenation: Pre-oxygenation with 100% oxygen Intubation Type: IV induction and Cricoid Pressure applied Ventilation: Mask ventilation without difficulty Laryngoscope Size: Mac and 4 Grade View: Grade I Tube type: Oral Tube size: 7.5 mm Number of attempts: 1 Airway Equipment and Method: Stylet Placement Confirmation: ETT inserted through vocal cords under direct vision,  positive ETCO2 and breath sounds checked- equal and bilateral Secured at: 23 cm Tube secured with: Tape Dental Injury: Teeth and Oropharynx as per pre-operative assessment

## 2015-04-15 NOTE — Anesthesia Preprocedure Evaluation (Signed)
Anesthesia Evaluation  Patient identified by MRN, date of birth, ID band Patient awake    Reviewed: Allergy & Precautions, NPO status , Patient's Chart, lab work & pertinent test results  History of Anesthesia Complications Negative for: history of anesthetic complications  Airway Mallampati: II  TM Distance: >3 FB Neck ROM: Full    Dental  (+) Teeth Intact   Pulmonary neg shortness of breath, neg sleep apnea, neg COPD, neg recent URI, former smoker,    breath sounds clear to auscultation       Cardiovascular hypertension, Pt. on medications + Peripheral Vascular Disease   Rhythm:Regular     Neuro/Psych negative neurological ROS  negative psych ROS   GI/Hepatic Neg liver ROS, PUD,   Endo/Other  Morbid obesity  Renal/GU Renal InsufficiencyRenal disease     Musculoskeletal   Abdominal   Peds  Hematology  (+) Blood dyscrasia, anemia ,   Anesthesia Other Findings   Reproductive/Obstetrics                             Anesthesia Physical Anesthesia Plan  ASA: III  Anesthesia Plan: General   Post-op Pain Management:    Induction: Intravenous  Airway Management Planned: Oral ETT  Additional Equipment: None  Intra-op Plan:   Post-operative Plan: Extubation in OR  Informed Consent: I have reviewed the patients History and Physical, chart, labs and discussed the procedure including the risks, benefits and alternatives for the proposed anesthesia with the patient or authorized representative who has indicated his/her understanding and acceptance.   Dental advisory given  Plan Discussed with: CRNA and Surgeon  Anesthesia Plan Comments:         Anesthesia Quick Evaluation

## 2015-04-15 NOTE — Progress Notes (Signed)
CRITICAL VALUE ALERT  Critical value received:  HgB 6.7  Date of notification:  04/15/2015  Time of notification:  4:49  Critical value read back:Yes.    Nurse who received alert:  Dayton Bailiff, RN  MD notified (1st page):  Claiborne Billings  Time of first page:  4:49  Responding MD:  Claiborne Billings  Time MD responded:  4:52

## 2015-04-15 NOTE — Op Note (Signed)
    OPERATIVE NOTE   PROCEDURE: 1. Washout of right thigh abscess cavity 2. Negative pressure dressing placement  PRE-OPERATIVE DIAGNOSIS: right thigh mycotic aneurysm  POST-OPERATIVE DIAGNOSIS: same as above   SURGEON: Leonides Sake, MD  ANESTHESIA: general  ESTIMATED BLOOD LOSS: 30 cc  FINDING(S): 1.  Minimal residual necrotic fat 2.  Minimal residual necrotic tissue 3.  No further purulence noted 4.  Intact proximal and distal superficial femoral artery stump 5.  Intact femoral vein  SPECIMEN(S):  none  INDICATIONS:   [name] is a 59 y.o. male who presents with mycotic right superficial femoral artery.  I took this patient back to the OR previously to control the ruptured superficial femoral artery and washout the abscess cavity.  He returns to the OR today to washout the cavity again and re-evaluate his closure options.  DESCRIPTION: After obtaining full informed written consent, the patient was brought back to the operating room and placed supine upon the operating table.  The patient received IV antibiotics prior to induction.  After obtaining adequate anesthesia, the patient was prepped and draped in the standard fashion for: right thigh exploration.  I had previously removed all prior VAC sponges sterilely.  I briefly explored this wound.  There was minimal residual necrotic fat, which I sharply debridement.   Most of the cavity was clean with some granulation evident.  There was some bleeding in the muscle beds at this point.  There was minimal residual necrotic tissue adjacent to the femoral vein.  I washed out this wound with 3 L of sterile normal saline.  At this point, I fashioned a piece of white sponge to cover the femoral vein.  I then fashioned a VAC dressing for this wound.  I affixed adhesive strips to seal this cavity and then cut a hole in the middle for the lilypad.  The lilypad was applied and then connected to the VAC pump.  The pump was set to 125 continuous  and the VAC dressing became adherent without any leaks.  In my opinion, this patient would be best serve with muscle flap coverage of the femoral vein and superficial femoral artery stumps.  .   COMPLICATIONS: none  CONDITION: stable   Leonides Sake, MD [facility] [place] [facility] Office: [phone] Pager: [phone]  04/15/2015, 10:03 AM

## 2015-04-15 NOTE — Progress Notes (Addendum)
ANTICOAGULATION CONSULT NOTE - Follow Up Consult  Pharmacy Consult for Heparin  Indication: Thromboembolism to Right Foot  Allergies  Allergen Reactions  . Imitrex [Sumatriptan] Other (See Comments)    Chest pain, 2004 (tolerates 50 mg prn)  . Verapamil Other (See Comments)    Causes pvc's    Patient Measurements: Height:  (190.5 cm) Weight: 228 lb (103.42 kg) IBW/kg (Calculated) : 84.5 Heparin dosing weight: 103 kg  Vital Signs: Temp: 100 F (37.8 C) (09/19 1913) Temp Source: Oral (09/19 1913) BP: 113/63 mmHg (09/19 1549) Pulse Rate: 115 (09/19 1549)  Labs:  Recent Labs  04/13/15 1254  04/14/15 0250  04/15/15 0413 04/15/15 0613 04/15/15 1250 04/15/15 1922  HGB 7.0*  --  7.9*  --  6.7*  --  8.2*  --   HCT 21.0*  --  23.8*  --  20.5*  --  25.5*  --   PLT 336  --  399  --  432*  --  484*  --   HEPARINUNFRC  --   < > <0.10*  < >  --  <0.10* <0.10* <0.10*  CREATININE 1.34*  --   --   --  1.20  --   --   --   < > = values in this interval not displayed.  Estimated Creatinine Clearance: 89.5 mL/min (by C-G formula based on Cr of 1.2).   Medications:  Scheduled:  .  ceFAZolin (ANCEF) IV  2 g Intravenous 3 times per day  . docusate sodium  100 mg Oral BID  . feeding supplement (ENSURE ENLIVE)  237 mL Oral BID BM  . HYDROmorphone      . HYDROmorphone      . HYDROmorphone      . HYDROmorphone      . metoprolol tartrate  25 mg Oral BID  . morphine  90 mg Oral 2 times per day  . morphine   Intravenous 6 times per day  . naloxegol oxalate  25 mg Oral Daily  . oxyCODONE      . sodium chloride  3 mL Intravenous Q12H  . Tdap  0.5 mL Intramuscular Once  . venlafaxine XR  37.5 mg Oral Q breakfast   Infusions:  . sodium chloride 30 mL/hr at 04/15/15 1200  . heparin 3,000 Units/hr (04/15/15 1356)   PRN: acetaminophen **OR** acetaminophen, clonazePAM, diphenhydrAMINE **OR** diphenhydrAMINE, famciclovir, levalbuterol, naloxone **AND** sodium chloride, ondansetron  (ZOFRAN) IV, oxyCODONE-acetaminophen, trifluridine  Assessment: 58 YOM admitted with MSSA bacteremia complicated by a 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  Pharmacy consulted for  heparin for right foot thromboembolism secondary to R groin aneurysm. He is noted s/p washout of right thigh abscess cavity today. He is also noted for possible surgery on 9/23. -heparin level is still < 0.1 after bolus and infusion increase to 3000 units/hr -Hg was noted as 6.7 this am now s/p PRBC transfusion  Goal of Therapy:  Heparin level 0.3-0.7 units/ml Monitor platelets by anticoagulation protocol: Yes   Plan:   -4000 IV unit heparin bolus -Increase heparin to 3300 units/hr -Heparin level in 6 hours and daily wth CBC daily  Harland German, Pharm D 04/15/2015 8:40 PM   ADDN: Repeat HL remains undetectable after 3 units/kg/hr increase to 3300 units/hr and AT3 level is slightly low. Nurse reports no issues with infusion or bleeding and no further drainage.   Plan: 5000 unit heparin bolus and increase rate to 3800 units/hr 6h HL  Arlean Hopping.  Newman Pies, PharmD Clinical Pharmacist Pager [phone]

## 2015-04-15 NOTE — Progress Notes (Addendum)
ANTICOAGULATION CONSULT NOTE - Follow Up Consult  Pharmacy Consult for Heparin  Indication: Thromboembolism to Right Foot  Allergies  Allergen Reactions  . Imitrex [Sumatriptan] Other (See Comments)    Chest pain, 2004 (tolerates 50 mg prn)  . Verapamil Other (See Comments)    Causes pvc's    Patient Measurements: Height:  (190.5 cm) Weight: 228 lb (103.42 kg) IBW/kg (Calculated) : 84.5 Heparin dosing weight: 103 kg  Vital Signs: Temp: 98.3 F (36.8 C) (09/19 1210) Temp Source: Oral (09/19 1210) BP: 137/79 mmHg (09/19 1210) Pulse Rate: 112 (09/19 1210)  Labs:  Recent Labs  04/13/15 1254  04/14/15 0250  04/14/15 2319 04/15/15 0413 04/15/15 0613 04/15/15 1250  HGB 7.0*  --  7.9*  --   --  6.7*  --  8.2*  HCT 21.0*  --  23.8*  --   --  20.5*  --  25.5*  PLT 336  --  399  --   --  432*  --  484*  HEPARINUNFRC  --   < > <0.10*  < > <0.10*  --  <0.10* <0.10*  CREATININE 1.34*  --   --   --   --  1.20  --   --   < > = values in this interval not displayed.  Estimated Creatinine Clearance: 89.5 mL/min (by C-G formula based on Cr of 1.2).   Medications:  Scheduled:  . sodium chloride   Intravenous Once  .  ceFAZolin (ANCEF) IV  2 g Intravenous 3 times per day  . docusate sodium  100 mg Oral BID  . feeding supplement (ENSURE ENLIVE)  237 mL Oral BID BM  . HYDROmorphone      . HYDROmorphone      . HYDROmorphone      . HYDROmorphone      . metoprolol tartrate  25 mg Oral BID  . morphine  90 mg Oral 2 times per day  . morphine   Intravenous 6 times per day  . naloxegol oxalate  25 mg Oral Daily  . oxyCODONE      . potassium chloride  40 mEq Oral BID  . sodium chloride  3 mL Intravenous Q12H  . Tdap  0.5 mL Intramuscular Once  . venlafaxine XR  37.5 mg Oral Q breakfast   Infusions:  . sodium chloride 30 mL/hr at 04/14/15 1700  . 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  Pharmacy consulted for tx dose heparin for right foot thromboembolism secondary to R groin aneurysm.  INR was 2.26 on 04/07/15, but had been taking Xarelto 10 mg daily, which he had at home from recent left knee revision. Xarelto had been stopped in May due to bleeding into knee per patient, but he had resumed taking Xarelto 10 mg daily 4 days prior to admission.  Per vasc surgery note: Washout of right thigh abscess cavity revealed additional septae which lead to a medial accumulation of infected hematoma. Additionally, the proximal superficial femoral artery remains intact.  The vein will be at higher risk of clotting.  MD believes DVT/PE risk exceeds the bleeding risk.   F/u HL remains undetectable after changing to central line access on heparin 2300 units/hr. Nurse reports no issues with infusion or bleeding  since changing to central line.  Patient was in OR this AM and heparin was continued at 2000 units/hr per [facility]. Will re-bolus and start with last dose since there was change in ordered dose.   Goal of Therapy:  Heparin level 0.3-0.7 units/ml Monitor platelets by anticoagulation protocol: Yes   Plan:  Bolus 4000 units and continue rate at 3000 units/hr 6 hr HL Daily CBC/HL Monitor for s/sx of bleeding    Juanita Craver, PharmD, BCPS Clinical Pharmacist [phone]

## 2015-04-15 NOTE — Progress Notes (Signed)
INFECTIOUS DISEASE PROGRESS NOTE  ID: [name] is a 59 y.o. male with  Principal Problem:   Severe sepsis Active Problems:   Chronic pain syndrome   History of duodenal ulcer   ARF (acute renal failure)   Cellulitis of left hand   Elevated INR   Foot swelling   Elevated bilirubin   Hypokalemia   Hypomagnesemia   Femoral artery aneurysm, right   UTI (lower urinary tract infection)   Bacteremia   Bacteremia due to Staphylococcus aureus   Acute renal failure syndrome   Fracture of left radius   Cellulitis   Arm pain, anterior  Subjective: "I haven't eaten all day, I just want a hamburger"  Abtx:  Anti-infectives    Start     Dose/Rate Route Frequency Ordered Stop   04/12/15 1130  vancomycin (VANCOCIN) 1,250 mg in sodium chloride 0.9 % 250 mL IVPB  Status:  Discontinued     1,250 mg 166.7 mL/hr over 90 Minutes Intravenous Every 12 hours 04/12/15 1101 04/12/15 1203   04/11/15 1415  polymyxin B 500,000 Units, bacitracin 50,000 Units in sodium chloride irrigation 0.9 % 500 mL irrigation  Status:  Discontinued       As needed 04/11/15 1416 04/11/15 1522   04/10/15 2200  ceFAZolin (ANCEF) IVPB 2 g/50 mL premix     2 g 100 mL/hr over 30 Minutes Intravenous 3 times per day 04/10/15 1756     04/08/15 0900  vancomycin (VANCOCIN) 1,250 mg in sodium chloride 0.9 % 250 mL IVPB  Status:  Discontinued     1,250 mg 166.7 mL/hr over 90 Minutes Intravenous Every 24 hours 04/07/15 0835 04/12/15 1101   04/07/15 1100  piperacillin-tazobactam (ZOSYN) IVPB 3.375 g  Status:  Discontinued     3.375 g 12.5 mL/hr over 240 Minutes Intravenous 3 times per day 04/07/15 0834 04/09/15 1042   04/07/15 0845  vancomycin (VANCOCIN) 1,250 mg in sodium chloride 0.9 % 250 mL IVPB     1,250 mg 166.7 mL/hr over 90 Minutes Intravenous  Once 04/07/15 0834 04/07/15 1101   04/07/15 0730  piperacillin-tazobactam (ZOSYN) IVPB 3.375 g  Status:  Discontinued     3.375 g 100 mL/hr over 30 Minutes Intravenous   Once 04/07/15 0723 04/07/15 0727   04/07/15 0730  vancomycin (VANCOCIN) IVPB 1000 mg/200 mL premix  Status:  Discontinued     1,000 mg 200 mL/hr over 60 Minutes Intravenous  Once 04/07/15 0723 04/07/15 0727   04/07/15 0724  famciclovir (FAMVIR) tablet 500 mg     500 mg Oral 2 times daily PRN 04/07/15 0729     04/07/15 0215  vancomycin (VANCOCIN) IVPB 1000 mg/200 mL premix     1,000 mg 200 mL/hr over 60 Minutes Intravenous  Once 04/07/15 0212 04/07/15 0311   04/07/15 0215  piperacillin-tazobactam (ZOSYN) IVPB 3.375 g     3.375 g 100 mL/hr over 30 Minutes Intravenous  Once 04/07/15 1914 04/07/15 0503      Medications:  Scheduled: .  ceFAZolin (ANCEF) IV  2 g Intravenous 3 times per day  . docusate sodium  100 mg Oral BID  . feeding supplement (ENSURE ENLIVE)  237 mL Oral BID BM  . HYDROmorphone      . HYDROmorphone      . HYDROmorphone      . HYDROmorphone      . metoprolol tartrate  25 mg Oral BID  . morphine  90 mg Oral 2 times per day  . morphine  Intravenous 6 times per day  . naloxegol oxalate  25 mg Oral Daily  . oxyCODONE      . sodium chloride  3 mL Intravenous Q12H  . Tdap  0.5 mL Intramuscular Once  . venlafaxine XR  37.5 mg Oral Q breakfast    Objective: Vital signs in last 24 hours: Temp:  [97 F (36.1 C)-100.6 F (38.1 C)] 100 F (37.8 C) (09/19 1913) Pulse Rate:  [99-115] 115 (09/19 1549) Resp:  [10-22] 18 (09/19 1600) BP: (102-137)/(61-93) 113/63 mmHg (09/19 1549) SpO2:  [96 %-99 %] 97 % (09/19 1600)   General appearance: alert, cooperative and no distress Resp: clear to auscultation bilaterally Cardio: regular rate and rhythm GI: normal findings: bowel sounds normal and soft, non-tender Extremities: RLE is swollen, toes discolored.   Lab Results  Recent Labs  04/13/15 1254  04/15/15 0413 04/15/15 1250  WBC 29.5*  < > 18.0* 18.1*  HGB 7.0*  < > 6.7* 8.2*  HCT 21.0*  < > 20.5* 25.5*  NA 139  --  136  --   K 3.3*  --  4.2  --   CL 112*  --   108  --   CO2 21*  --  23  --   BUN 14  --  12  --   CREATININE 1.34*  --  1.20  --   < > = values in this interval not displayed. Liver Panel  Recent Labs  04/13/15 1254 04/15/15 0413  PROT 5.3*  --   ALBUMIN 1.1* 1.0*  AST 27  --   ALT 10*  --   ALKPHOS 150*  --   BILITOT 1.1  --    Sedimentation Rate No results for input(s): ESRSEDRATE in the last 72 hours. C-Reactive Protein No results for input(s): CRP in the last 72 hours.  Microbiology: Recent Results (from the past 240 hour(s))  Culture, blood (routine x 2)     Status: None   Collection Time: 04/06/15 11:50 PM  Result Value Ref Range Status   Specimen Description BLOOD RIGHT HAND  Final   Special Requests   Final    BOTTLES DRAWN AEROBIC AND ANAEROBIC AER 10cc ANA 10cc   Culture  Setup Time   Final    GRAM POSITIVE COCCI IN CLUSTERS IN BOTH AEROBIC AND ANAEROBIC BOTTLES CORRECTED RESULTS CALLED TO: W DAVIS,RN AT 1151 04/08/15 BY L BENFIELD PREVIOUSLY REPORTED AS GRAM NEGATIVE COCCI AND GRAM VARIABLE COCCI    Culture   Final    STAPHYLOCOCCUS AUREUS SUSCEPTIBILITIES PERFORMED ON PREVIOUS CULTURE WITHIN THE LAST 5 DAYS. Performed at [facility]    Report Status 04/09/2015 FINAL  Final  Culture, blood (routine x 2)     Status: None   Collection Time: 04/07/15 12:05 AM  Result Value Ref Range Status   Specimen Description BLOOD RIGHT FOREARM  Final   Special Requests   Final    BOTTLES DRAWN AEROBIC AND ANAEROBIC AER 5cc ANA 5cc   Culture  Setup Time   Final    GRAM POSITIVE COCCI IN CLUSTERS IN BOTH AEROBIC AND ANAEROBIC BOTTLES CORRECTED RESULTS CALLED TO: W DAVIS,RN AT 1151 04/08/15 BY L BENFIELD PREVIOUSLY REPORTED AS GRAM NEGATIVE COCCI AND GRAM VARIABLE COCCI    Culture   Final    STAPHYLOCOCCUS AUREUS BACILLUS SPECIES Standardized susceptibility testing for this organism is not available. CRITICAL RESULT CALLED TO, READ BACK BY AND VERIFIED WITH: Hollace Kinnier AT 1318 04/09/15 BY L  BENFIELD Performed at  [facility]    Report Status 04/09/2015 FINAL  Final   Organism ID, Bacteria STAPHYLOCOCCUS AUREUS  Final      Susceptibility   Staphylococcus aureus - MIC*    CIPROFLOXACIN <=0.5 SENSITIVE Sensitive     ERYTHROMYCIN <=0.25 SENSITIVE Sensitive     GENTAMICIN <=0.5 SENSITIVE Sensitive     OXACILLIN <=0.25 SENSITIVE Sensitive     TETRACYCLINE <=1 SENSITIVE Sensitive     VANCOMYCIN <=0.5 SENSITIVE Sensitive     TRIMETH/SULFA <=10 SENSITIVE Sensitive     CLINDAMYCIN <=0.25 SENSITIVE Sensitive     RIFAMPIN <=0.5 SENSITIVE Sensitive     Inducible Clindamycin NEGATIVE Sensitive     * STAPHYLOCOCCUS AUREUS  MRSA PCR Screening     Status: None   Collection Time: 04/07/15  6:30 AM  Result Value Ref Range Status   MRSA by PCR NEGATIVE NEGATIVE Final    Comment:        The GeneXpert MRSA Assay (FDA approved for NASAL specimens only), is one component of a comprehensive MRSA colonization surveillance program. It is not intended to diagnose MRSA infection nor to guide or monitor treatment for MRSA infections.   Urine culture     Status: None   Collection Time: 04/07/15  7:55 AM  Result Value Ref Range Status   Specimen Description URINE, RANDOM  Final   Special Requests NONE  Final   Culture NO GROWTH 1 DAY  Final   Report Status 04/08/2015 FINAL  Final  Culture, blood (routine x 2)     Status: None   Collection Time: 04/08/15  6:21 PM  Result Value Ref Range Status   Specimen Description BLOOD RIGHT ANTECUBITAL  Final   Special Requests BOTTLES DRAWN AEROBIC ONLY 4CC  Final   Culture NO GROWTH 5 DAYS  Final   Report Status 04/13/2015 FINAL  Final  Culture, routine-abscess     Status: None   Collection Time: 04/11/15  1:54 PM  Result Value Ref Range Status   Specimen Description ABSCESS RIGHT THIGH  Final   Special Requests PATIENT ON FOLLOWING ANCEF VANCOMYCIN  Final   Gram Stain   Final    FEW WBC PRESENT, PREDOMINANTLY PMN NO SQUAMOUS  EPITHELIAL CELLS SEEN RARE GRAM POSITIVE COCCI IN PAIRS IN CLUSTERS Performed at [facility]    Culture   Final    FEW STAPHYLOCOCCUS AUREUS Note: RIFAMPIN AND GENTAMICIN SHOULD NOT BE USED AS SINGLE DRUGS FOR TREATMENT OF STAPH INFECTIONS. Performed at [facility]    Report Status 04/14/2015 FINAL  Final   Organism ID, Bacteria STAPHYLOCOCCUS AUREUS  Final      Susceptibility   Staphylococcus aureus - MIC*    CLINDAMYCIN <=0.25 SENSITIVE Sensitive     ERYTHROMYCIN <=0.25 SENSITIVE Sensitive     GENTAMICIN <=0.5 SENSITIVE Sensitive     LEVOFLOXACIN 0.25 SENSITIVE Sensitive     OXACILLIN 0.5 SENSITIVE Sensitive     RIFAMPIN <=0.5 SENSITIVE Sensitive     TRIMETH/SULFA <=10 SENSITIVE Sensitive     VANCOMYCIN 1 SENSITIVE Sensitive     TETRACYCLINE <=1 SENSITIVE Sensitive     MOXIFLOXACIN <=0.25 SENSITIVE Sensitive     * FEW STAPHYLOCOCCUS AUREUS    Studies/Results: Dg Chest Portable 1 View  04/14/2015   CLINICAL DATA:  Encounter for central line placement  EXAM: PORTABLE CHEST - 1 VIEW  COMPARISON:  04/07/2015  FINDINGS: Cardiomediastinal silhouette is unremarkable. No acute infiltrate or pleural effusion. No pulmonary edema. There is left IJ central line with  tip in SVC. No pneumothorax.  IMPRESSION: Left IJ central line with tip in SVC.  No pneumothorax.   Electronically Signed   By: Natasha Mead M.D.   On: 04/14/2015 16:17     Assessment/Plan: Sepsis Staph bacteremia- MSSA 2/2 1/2 BCx+ Bacillus Would check TEE when able Repeat BCx 9-12 (-) Will continue ancef alone as his wound Cx 9-15 shows MSSA  WBC improving, still high.   Wrist Fracture Appreciate ortho f/u  RLE aneurysm and thrombus  Debrided 9-19, return to OR 9-21  Acute Renal failure Cr normalized  Therapeutic Drug monitoring off Vanco  Follow Cr- normalized.   Total days of  antibiotics: 6 vanco --> ancef         [name] Infectious Diseases (pager) [phone] [email or web address] 04/15/2015, 7:49 PM  LOS: 8 days

## 2015-04-15 NOTE — Interval H&P Note (Signed)
History and Physical Interval Note:  04/15/2015 8:55 AM  [name]  has presented today for surgery, with the diagnosis of Mycotic right femoral aneurysm   The various methods of treatment have been discussed with the patient and family. After consideration of risks, benefits and other options for treatment, the patient has consented to  Procedure(s): WASHOUT THIGH ABSCESS/ VAC DRESSING CHANGE (Right) as a surgical intervention .  The patient's history has been reviewed, patient examined, no change in status, stable for surgery.  I have reviewed the patient's chart and labs.  Questions were answered to the patient's satisfaction.     Leonides Sake

## 2015-04-15 NOTE — Progress Notes (Signed)
Echocardiogram 2D Echocardiogram has been performed.  [name] 04/15/2015, 4:38 PM

## 2015-04-16 ENCOUNTER — Encounter (HOSPITAL_COMMUNITY)
Admission: EM | Disposition: A | Discharge status: Rehabilitation Facility | Payer: Self-pay | Source: Home / Self Care | Attending: Internal Medicine

## 2015-04-16 DIAGNOSIS — L03012 Cellulitis of left finger: Secondary | ICD-10-CM

## 2015-04-16 DIAGNOSIS — S52509A Unspecified fracture of the lower end of unspecified radius, initial encounter for closed fracture: Secondary | ICD-10-CM | POA: Diagnosis present

## 2015-04-16 DIAGNOSIS — E43 Unspecified severe protein-calorie malnutrition: Secondary | ICD-10-CM

## 2015-04-16 DIAGNOSIS — N10 Acute tubulo-interstitial nephritis: Secondary | ICD-10-CM | POA: Diagnosis present

## 2015-04-16 LAB — COMPREHENSIVE METABOLIC PANEL
ALBUMIN: 1.1 g/dL — AB (ref 3.5–5.0)
ALK PHOS: 274 U/L — AB (ref 38–126)
ALT: 11 U/L — AB (ref 17–63)
AST: 37 U/L (ref 15–41)
Anion gap: 5 (ref 5–15)
BUN: 12 mg/dL (ref 6–20)
CALCIUM: 7.3 mg/dL — AB (ref 8.9–10.3)
CO2: 25 mmol/L (ref 22–32)
CREATININE: 1.27 mg/dL — AB (ref 0.61–1.24)
Chloride: 106 mmol/L (ref 101–111)
GFR calc non Af Amer: >60 mL/min (ref >60)
GLUCOSE: 111 mg/dL — AB (ref 65–99)
Potassium: 3.6 mmol/L (ref 3.5–5.1)
SODIUM: 136 mmol/L (ref 135–145)
Total Bilirubin: 0.8 mg/dL (ref 0.3–1.2)
Total Protein: 5.7 g/dL — ABNORMAL LOW (ref 6.5–8.1)

## 2015-04-16 LAB — HEPARIN LEVEL (UNFRACTIONATED)
Heparin Unfractionated: <0.1 IU/mL — ABNORMAL LOW (ref 0.30–0.70)
Heparin Unfractionated: <0.1 IU/mL — ABNORMAL LOW (ref 0.30–0.70)

## 2015-04-16 LAB — APTT
APTT: 93 s — AB (ref 24–37)
aPTT: 57 seconds — ABNORMAL HIGH (ref 24–37)
aPTT: 85 seconds — ABNORMAL HIGH (ref 24–37)

## 2015-04-16 LAB — CBC
HCT: 24.4 % — ABNORMAL LOW (ref 39.0–52.0)
HEMOGLOBIN: 7.8 g/dL — AB (ref 13.0–17.0)
MCH: 27 pg (ref 26.0–34.0)
MCHC: 32 g/dL (ref 30.0–36.0)
MCV: 84.4 fL (ref 78.0–100.0)
Platelets: 531 10*3/uL — ABNORMAL HIGH (ref 150–400)
RBC: 2.89 MIL/uL — AB (ref 4.22–5.81)
RDW: 17.1 % — ABNORMAL HIGH (ref 11.5–15.5)
WBC: 14.6 10*3/uL — AB (ref 4.0–10.5)

## 2015-04-16 LAB — TYPE AND SCREEN
ABO/RH(D): A POS
ANTIBODY SCREEN: NEGATIVE
UNIT DIVISION: 0
UNIT DIVISION: 0

## 2015-04-16 LAB — ANTITHROMBIN III: AntiThromb III Func: 62 % — ABNORMAL LOW (ref 75–120)

## 2015-04-16 SURGERY — IRRIGATION AND DEBRIDEMENT EXTREMITY
Anesthesia: General | Site: Thigh | Laterality: Right

## 2015-04-16 MED ORDER — HEPARIN BOLUS VIA INFUSION
5000.0000 [IU] | Freq: Once | INTRAVENOUS | Status: AC
  Administered 2015-04-16: 5000 [IU] via INTRAVENOUS
  Filled 2015-04-16: qty 5000

## 2015-04-16 MED ORDER — ARGATROBAN 50 MG/50ML IV SOLN
0.8000 ug/kg/min | INTRAVENOUS | Status: DC
  Administered 2015-04-16: 1 ug/kg/min via INTRAVENOUS
  Administered 2015-04-16: 0.8 ug/kg/min via INTRAVENOUS
  Filled 2015-04-16 (×2): qty 50

## 2015-04-16 MED ORDER — ENSURE ENLIVE PO LIQD
237.0000 mL | Freq: Three times a day (TID) | ORAL | Status: DC
  Administered 2015-04-16 – 2015-04-30 (×33): 237 mL via ORAL

## 2015-04-16 NOTE — Progress Notes (Signed)
INFECTIOUS DISEASE PROGRESS NOTE  ID: [name] is a 59 y.o. male with  Principal Problem:   Severe sepsis Active Problems:   Chronic pain syndrome   History of duodenal ulcer   ARF (acute renal failure)   Cellulitis of left hand   Elevated INR   Foot swelling   Elevated bilirubin   Hypokalemia   Hypomagnesemia   Femoral artery aneurysm, right   UTI (lower urinary tract infection)   Bacteremia   Bacteremia due to Staphylococcus aureus   Acute renal failure syndrome   Fracture of left radius   Cellulitis   Arm pain, anterior  Subjective: No complaints  Abtx:  Anti-infectives    Start     Dose/Rate Route Frequency Ordered Stop   04/12/15 1130  vancomycin (VANCOCIN) 1,250 mg in sodium chloride 0.9 % 250 mL IVPB  Status:  Discontinued     1,250 mg 166.7 mL/hr over 90 Minutes Intravenous Every 12 hours 04/12/15 1101 04/12/15 1203   04/11/15 1415  polymyxin B 500,000 Units, bacitracin 50,000 Units in sodium chloride irrigation 0.9 % 500 mL irrigation  Status:  Discontinued       As needed 04/11/15 1416 04/11/15 1522   04/10/15 2200  ceFAZolin (ANCEF) IVPB 2 g/50 mL premix     2 g 100 mL/hr over 30 Minutes Intravenous 3 times per day 04/10/15 1756     04/08/15 0900  vancomycin (VANCOCIN) 1,250 mg in sodium chloride 0.9 % 250 mL IVPB  Status:  Discontinued     1,250 mg 166.7 mL/hr over 90 Minutes Intravenous Every 24 hours 04/07/15 0835 04/12/15 1101   04/07/15 1100  piperacillin-tazobactam (ZOSYN) IVPB 3.375 g  Status:  Discontinued     3.375 g 12.5 mL/hr over 240 Minutes Intravenous 3 times per day 04/07/15 0834 04/09/15 1042   04/07/15 0845  vancomycin (VANCOCIN) 1,250 mg in sodium chloride 0.9 % 250 mL IVPB     1,250 mg 166.7 mL/hr over 90 Minutes Intravenous  Once 04/07/15 0834 04/07/15 1101   04/07/15 0730  piperacillin-tazobactam (ZOSYN) IVPB 3.375 g  Status:  Discontinued     3.375 g 100 mL/hr over 30 Minutes Intravenous  Once 04/07/15 0723 04/07/15 0727   04/07/15 0730  vancomycin (VANCOCIN) IVPB 1000 mg/200 mL premix  Status:  Discontinued     1,000 mg 200 mL/hr over 60 Minutes Intravenous  Once 04/07/15 0723 04/07/15 0727   04/07/15 0724  famciclovir (FAMVIR) tablet 500 mg     500 mg Oral 2 times daily PRN 04/07/15 0729     04/07/15 0215  vancomycin (VANCOCIN) IVPB 1000 mg/200 mL premix     1,000 mg 200 mL/hr over 60 Minutes Intravenous  Once 04/07/15 0212 04/07/15 0311   04/07/15 0215  piperacillin-tazobactam (ZOSYN) IVPB 3.375 g     3.375 g 100 mL/hr over 30 Minutes Intravenous  Once 04/07/15 1308 04/07/15 0503      Medications:  Scheduled: .  ceFAZolin (ANCEF) IV  2 g Intravenous 3 times per day  . docusate sodium  100 mg Oral BID  . feeding supplement (ENSURE ENLIVE)  237 mL Oral BID BM  . metoprolol tartrate  25 mg Oral BID  . morphine  90 mg Oral 2 times per day  . morphine   Intravenous 6 times per day  . naloxegol oxalate  25 mg Oral Daily  . sodium chloride  3 mL Intravenous Q12H  . Tdap  0.5 mL Intramuscular Once  . venlafaxine XR  37.5 mg Oral Q breakfast    Objective: Vital signs in last 24 hours: Temp:  [97 F (36.1 C)-100.6 F (38.1 C)] 98.5 F (36.9 C) (09/20 0818) Pulse Rate:  [96-115] 96 (09/20 0727) Resp:  [10-20] 12 (09/20 0824) BP: (109-137)/(54-85) 127/75 mmHg (09/20 0727) SpO2:  [93 %-100 %] 97 % (09/20 0824)   General appearance: alert, cooperative and no distress Resp: clear to auscultation bilaterally Cardio: regular rate and rhythm GI: normal findings: bowel sounds normal and soft, non-tender Extremities: RLE edematous, discolored toes/infarcts.   Lab Results  Recent Labs  04/15/15 0413 04/15/15 1250 04/16/15 0256  WBC 18.0* 18.1* 14.6*  HGB 6.7* 8.2* 7.8*  HCT 20.5* 25.5* 24.4*  NA 136  --  136  K 4.2  --  3.6  CL 108  --  106  CO2 23  --  25  BUN 12  --  12  CREATININE 1.20  --  1.27*   Liver Panel  Recent Labs  04/13/15 1254 04/15/15 0413 04/16/15 0256  PROT 5.3*  --   5.7*  ALBUMIN 1.1* 1.0* 1.1*  AST 27  --  37  ALT 10*  --  11*  ALKPHOS 150*  --  274*  BILITOT 1.1  --  0.8   Sedimentation Rate No results for input(s): ESRSEDRATE in the last 72 hours. C-Reactive Protein No results for input(s): CRP in the last 72 hours.  Microbiology: Recent Results (from the past 240 hour(s))  Culture, blood (routine x 2)     Status: None   Collection Time: 04/06/15 11:50 PM  Result Value Ref Range Status   Specimen Description BLOOD RIGHT HAND  Final   Special Requests   Final    BOTTLES DRAWN AEROBIC AND ANAEROBIC AER 10cc ANA 10cc   Culture  Setup Time   Final    GRAM POSITIVE COCCI IN CLUSTERS IN BOTH AEROBIC AND ANAEROBIC BOTTLES CORRECTED RESULTS CALLED TO: W DAVIS,RN AT 1151 04/08/15 BY L BENFIELD PREVIOUSLY REPORTED AS GRAM NEGATIVE COCCI AND GRAM VARIABLE COCCI    Culture   Final    STAPHYLOCOCCUS AUREUS SUSCEPTIBILITIES PERFORMED ON PREVIOUS CULTURE WITHIN THE LAST 5 DAYS. Performed at [facility]    Report Status 04/09/2015 FINAL  Final  Culture, blood (routine x 2)     Status: None   Collection Time: 04/07/15 12:05 AM  Result Value Ref Range Status   Specimen Description BLOOD RIGHT FOREARM  Final   Special Requests   Final    BOTTLES DRAWN AEROBIC AND ANAEROBIC AER 5cc ANA 5cc   Culture  Setup Time   Final    GRAM POSITIVE COCCI IN CLUSTERS IN BOTH AEROBIC AND ANAEROBIC BOTTLES CORRECTED RESULTS CALLED TO: W DAVIS,RN AT 1151 04/08/15 BY L BENFIELD PREVIOUSLY REPORTED AS GRAM NEGATIVE COCCI AND GRAM VARIABLE COCCI    Culture   Final    STAPHYLOCOCCUS AUREUS BACILLUS SPECIES Standardized susceptibility testing for this organism is not available. CRITICAL RESULT CALLED TO, READ BACK BY AND VERIFIED WITH: Hollace Kinnier AT 1318 04/09/15 BY L BENFIELD Performed at [facility]    Report Status 04/09/2015 FINAL  Final   Organism ID, Bacteria STAPHYLOCOCCUS AUREUS  Final      Susceptibility   Staphylococcus aureus - MIC*     CIPROFLOXACIN <=0.5 SENSITIVE Sensitive     ERYTHROMYCIN <=0.25 SENSITIVE Sensitive     GENTAMICIN <=0.5 SENSITIVE Sensitive     OXACILLIN <=0.25 SENSITIVE Sensitive     TETRACYCLINE <=1 SENSITIVE Sensitive  VANCOMYCIN <=0.5 SENSITIVE Sensitive     TRIMETH/SULFA <=10 SENSITIVE Sensitive     CLINDAMYCIN <=0.25 SENSITIVE Sensitive     RIFAMPIN <=0.5 SENSITIVE Sensitive     Inducible Clindamycin NEGATIVE Sensitive     * STAPHYLOCOCCUS AUREUS  MRSA PCR Screening     Status: None   Collection Time: 04/07/15  6:30 AM  Result Value Ref Range Status   MRSA by PCR NEGATIVE NEGATIVE Final    Comment:        The GeneXpert MRSA Assay (FDA approved for NASAL specimens only), is one component of a comprehensive MRSA colonization surveillance program. It is not intended to diagnose MRSA infection nor to guide or monitor treatment for MRSA infections.   Urine culture     Status: None   Collection Time: 04/07/15  7:55 AM  Result Value Ref Range Status   Specimen Description URINE, RANDOM  Final   Special Requests NONE  Final   Culture NO GROWTH 1 DAY  Final   Report Status 04/08/2015 FINAL  Final  Culture, blood (routine x 2)     Status: None   Collection Time: 04/08/15  6:21 PM  Result Value Ref Range Status   Specimen Description BLOOD RIGHT ANTECUBITAL  Final   Special Requests BOTTLES DRAWN AEROBIC ONLY 4CC  Final   Culture NO GROWTH 5 DAYS  Final   Report Status 04/13/2015 FINAL  Final  Culture, routine-abscess     Status: None   Collection Time: 04/11/15  1:54 PM  Result Value Ref Range Status   Specimen Description ABSCESS RIGHT THIGH  Final   Special Requests PATIENT ON FOLLOWING ANCEF VANCOMYCIN  Final   Gram Stain   Final    FEW WBC PRESENT, PREDOMINANTLY PMN NO SQUAMOUS EPITHELIAL CELLS SEEN RARE GRAM POSITIVE COCCI IN PAIRS IN CLUSTERS Performed at [facility]    Culture   Final    FEW STAPHYLOCOCCUS AUREUS Note: RIFAMPIN AND GENTAMICIN SHOULD NOT BE USED  AS SINGLE DRUGS FOR TREATMENT OF STAPH INFECTIONS. Performed at [facility]    Report Status 04/14/2015 FINAL  Final   Organism ID, Bacteria STAPHYLOCOCCUS AUREUS  Final      Susceptibility   Staphylococcus aureus - MIC*    CLINDAMYCIN <=0.25 SENSITIVE Sensitive     ERYTHROMYCIN <=0.25 SENSITIVE Sensitive     GENTAMICIN <=0.5 SENSITIVE Sensitive     LEVOFLOXACIN 0.25 SENSITIVE Sensitive     OXACILLIN 0.5 SENSITIVE Sensitive     RIFAMPIN <=0.5 SENSITIVE Sensitive     TRIMETH/SULFA <=10 SENSITIVE Sensitive     VANCOMYCIN 1 SENSITIVE Sensitive     TETRACYCLINE <=1 SENSITIVE Sensitive     MOXIFLOXACIN <=0.25 SENSITIVE Sensitive     * FEW STAPHYLOCOCCUS AUREUS    Studies/Results: Dg Chest Portable 1 View  04/14/2015   CLINICAL DATA:  Encounter for central line placement  EXAM: PORTABLE CHEST - 1 VIEW  COMPARISON:  04/07/2015  FINDINGS: Cardiomediastinal silhouette is unremarkable. No acute infiltrate or pleural effusion. No pulmonary edema. There is left IJ central line with tip in SVC. No pneumothorax.  IMPRESSION: Left IJ central line with tip in SVC.  No pneumothorax.   Electronically Signed   By: Natasha Mead M.D.   On: 04/14/2015 16:17     Assessment/Plan: Sepsis Staph bacteremia- MSSA 2/2 1/2 BCx+ Bacillus Would check TEE when able Repeat BCx 9-12 (-) Will continue ancef alone as his wound Cx 9-15 shows MSSA WBC improving, closer to normal.   Wrist Fracture  Appreciate ortho f/u  RLE aneurysm and thrombus Debrided 9-19, return to OR 9-21  Acute Renal failure Cr normalized  Therapeutic Drug monitoring off Vanco  Follow Cr- slightly elevated today.   Severe protein calorie malnutrition  Nutrition f/u.   Total days of antibiotics: 7 vanco --> ancef         [name] Infectious Diseases (pager)  [phone] [email or web address] 04/16/2015, 10:46 AM  LOS: 9 days

## 2015-04-16 NOTE — Progress Notes (Addendum)
ANTICOAGULATION CONSULT NOTE - Follow Up Consult  Pharmacy Consult for Heparin  Indication: Thromboembolism to Right Foot  Allergies  Allergen Reactions  . Imitrex [Sumatriptan] Other (See Comments)    Chest pain, 2004 (tolerates 50 mg prn)  . Verapamil Other (See Comments)    Causes pvc's    Patient Measurements: Height:  (190.5 cm) Weight: 228 lb (103.42 kg) IBW/kg (Calculated) : 84.5 Heparin dosing weight: 103 kg  Vital Signs: Temp: 99.1 F (37.3 C) (09/20 1154) Temp Source: Oral (09/20 1154) BP: 121/75 mmHg (09/20 1154) Pulse Rate: 92 (09/20 1154)  Labs:  Recent Labs  04/15/15 0413  04/15/15 1250 04/15/15 1922 04/16/15 0256 04/16/15 1149  HGB 6.7*  --  8.2*  --  7.8*  --   HCT 20.5*  --  25.5*  --  24.4*  --   PLT 432*  --  484*  --  531*  --   HEPARINUNFRC  --   < > <0.10* <0.10* <0.10* <0.10*  CREATININE 1.20  --   --   --  1.27*  --   < > = values in this interval not displayed.  Estimated Creatinine Clearance: 84.6 mL/min (by C-G formula based on Cr of 1.27).   Medications:  Scheduled:  .  ceFAZolin (ANCEF) IV  2 g Intravenous 3 times per day  . docusate sodium  100 mg Oral BID  . feeding supplement (ENSURE ENLIVE)  237 mL Oral BID BM  . metoprolol tartrate  25 mg Oral BID  . morphine  90 mg Oral 2 times per day  . morphine   Intravenous 6 times per day  . naloxegol oxalate  25 mg Oral Daily  . sodium chloride  3 mL Intravenous Q12H  . Tdap  0.5 mL Intramuscular Once  . venlafaxine XR  37.5 mg Oral Q breakfast   Infusions:  . sodium chloride 30 mL/hr at 04/16/15 0454  . 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 Pharmacy consulted for  heparin for right foot thromboembolism secondary to R groin aneurysm. He is noted s/p washout of right thigh abscess cavity. He is also noted for I&D on 9/21 surgery on 9/23.  HLs undetectable still and AT3 62 (slightly low - should be at least 75) on heparin 3800 units/hr + bolus. Will hit hard max at 3999 units/h - patient still subtherapeutic on 12 consecutive undetectable levels. Plans for I&D on 9/21 surgery on 9/23 (see plastic surgery note on 9/19) but may need to consider switching to lovenox and timing appropriately around surgery. Checked aPTT - Only slightly low 57 (66-102)  **Per Dr. Darrick Penna (Vasc surg) - start argatroban and d/c heparin (does not want lovenox due to pending surgeries). Will need to f/u long-term anticoag plan - lovenox may be better option post-surgeries  No IV line or bleed issues per RN. Baseline aPTT 34 prior to heparin IV on 9/13. APTT 57 on heparin today   Goal of Therapy:  Heparin level 0.3-0.7 units/ml  PTT = 50 to 90 seconds Monitor platelets by anticoagulation protocol: Yes   Plan:   -D/c heparin and switch to argatroban per Dr. Darrick Penna (Vasc Surg) -Start argatroban 50mcg/kg/min -aPTT 2 hours after start of argatroban therapy and adjust -aPTT every 2 hours  until two therapeutic levels are obtained. -CBC and aPTT at least once daily (Consider q12h aPTT for patients multiple organ failure). -Monitor for s/sx of bleeding -Repeat I&D 9/21; Plan for gracilis flap 9/23 -F/u long-term anticoag plan  Babs Bertin, PharmD Clinical Pharmacist Pager [phone] 04/16/2015 1:26 PM   04/16/15 1910 pm Initial PTT within range at 85 seconds Plan: No change in argatroban rate Recheck PTT at 2130 pm  Thank you Okey Regal, PharmD [phone]

## 2015-04-16 NOTE — Progress Notes (Signed)
[facility] TEAM 1 - Stepdown/ICU TEAM Progress Note  AMI THORNSBERRY NWG:[id number] DOB: [birth year] DOA: 04/06/2015 PCP: [name], MD  Admit HPI / Brief Narrative: 59 y.o.WM PMHx PUD, HLD, history of MRSA abscesses, peripheral neuropathy, and chronic pain   Presented to [facility] with severe left hand pain. He was found to be septic with acute kidney injury and left hand cellulitis. After admission he was noted to have a dusky swollen right foot. The patient reported that approximately 2 weeks prior he started feeling poorly and eating less. 9/7 he could not feel his right foot and fell as a result. He landed on his left wrist, shoulder, and back side. Since then his left hand has been very painful and his wrist and arm had begun to swell. He has developed thick erythematous streaking on his left arm.    In the ER: Lactic acid was 4.16, INR is 2.26 (not on anticoag), PTT is 24.7, WBC is 31.3, mag 1.3, potassium 2.7, procalcitonin 9.72. BP was 80/68 He received 3L of IV bolus in the ER. 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 Reportedly the patient injected his groin with testosterone 3 weeks ago.  HPI/Subjective: 9/20 A/O 4, S/P right thigh debridement, with wound VAC present. Negative CP, negative SOB. Decreased thigh pain. 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 -Hold any nephrotoxic medications. -Continue hydration 0.45% saline at 30 ml/hr  Left hand radius fracture/ cellulitis w/Hx MRSA abscess -[name], hand surgeon is directing care this issue,   Possible pyelonephritis -Creatinine improving with urinalysis   Elevated INR with coagulopathy -Resolving   Hypokalemia -WNL  Hypomagnesemia -WNL   Chronic pain -been pain medications for 12 years - managed by Dr. [name] [facility]  -Takes high dose scheduled klonopin. Have ordered chronic pain meds as he takes at home with holding parameters.  -Klonopin 0.5 TID PRN.    Code Status: FULL Family Communication: Wife present at time of exam Disposition Plan: Per surgery    Consultants: Hand Surgery Dr.Brian L Chen Vascular Surgery  Dr. [name] (ID)  Procedure/Significant Events: 9/15, 9/17, and 9/19 Right thigh abscess I&D, Excision of infected right superficial femoral artery aneurysm   Culture 9/10 blood right hand positive MSSA 9/11 blood right forearm positive MSSA 9/11 negative MRSA by PCR 9/11 urine negative 9/12 blood right AC NGTD   Antibiotics: Zosyn 9/10 > stopped 9/13 Vancomycin 9/10 > stopped 9/15 Ancef 9/14>>   Famciclovir PRN  9/11>>   DVT prophylaxis: SCD   Devices    LINES / TUBES:      Continuous Infusions: . sodium chloride 30 mL/hr at 04/16/15 0727  . argatroban 1 mcg/kg/min (04/16/15 1619)    Objective: VITAL SIGNS: Temp: 98.8 F (37.1 C) (09/20 1942) Temp Source:  Oral (09/20 1942) BP: 114/83 mmHg (09/20 2000) Pulse Rate: 89 (09/20 2000) SPO2; FIO2:   Intake/Output Summary (Last 24 hours) at 04/16/15 2219 Last data filed at 04/16/15 1840  Gross per 24 hour  Intake 1760.75 ml  Output   3650 ml  Net -1889.25 ml      Exam: General: A/O  4, No acute respiratory distress Eyes: Negative headache, negative scleral hemorrhage ENT: Negative Runny nose, negative ear pain, negative gingival bleeding, Neck:  Negative scars, masses, torticollis, lymphadenopathy, JVD Lungs: Clear to auscultation bilaterally without wheezes or crackles Cardiovascular: Regular rate and rhythm without murmur gallop or rub normal S1 and S2 Abdomen:negative abdominal pain, nondistended, positive soft, bowel sounds, no rebound, no ascites, no appreciable mass Extremities: right thigh/inguinal area swollen, erythematous, warm to touch, surgical area wound VAC in place. Right foot first and second metatarsal bluish in color, (increasing) , cool to touch, painful to touch on plantar surface, remainder of foot and leg 3-4+ edema. painful to the touch. Psychiatric:  Negative depression, negative anxiety, negative fatigue, negative mania  Neurologic:  Cranial nerves II through XII intact, tongue/uvula midline, all extremities muscle strength 5/5, sensation intact throughout,  negative dysarthria, negative expressive aphasia, negative receptive aphasia.   Data Reviewed: Basic Metabolic Panel:  Recent Labs Lab 04/10/15 0247 04/11/15 0426  04/11/15 1459 04/12/15 0350 04/13/15 1254 04/14/15 0250 04/15/15 0413 04/16/15 0256  NA 137 137  < > 138 134* 139  --  136 136  K 3.1* 4.3  < > 4.3 3.3* 3.3*  --  4.2 3.6  CL 110 108  --   --  107 112*  --  108 106  CO2 19* 17*  --   --  19* 21*  --  23 25  GLUCOSE 129* 94  < > 109* 131* 103*  --  105* 111*  BUN 29* 21*  --   --  18 14  --  12 12  CREATININE 1.86* 1.63*  --   --  1.41* 1.34*  --  1.20 1.27*  CALCIUM 7.6* 7.6*  --   --  6.8* 7.0*  --  7.3* 7.3*  MG 1.5* 1.3*  --   --  1.6*  --  1.8 1.7  --   PHOS  --   --   --   --   --   --  3.5 3.2  --   < > = values in this interval not displayed. Liver Function Tests:  Recent Labs Lab 04/10/15 0247 04/11/15 0426 04/12/15 0350  04/13/15 1254 04/15/15 0413 04/16/15 0256  AST 37 41 22 27  --  37  ALT 23 24 12* 10*  --  11*  ALKPHOS 145* 151* 92 150*  --  274*  BILITOT 2.1* 1.5* 0.9 1.1  --  0.8  PROT 5.5* 5.8* 5.0* 5.3*  --  5.7*  ALBUMIN 1.2* 1.2* 1.1* 1.1* 1.0* 1.1*   No results for input(s): LIPASE, AMYLASE in the last 168 hours. No results for input(s): AMMONIA in the last 168 hours. CBC:  Recent Labs Lab 04/10/15 0247  04/12/15 0350 04/13/15 1254 04/14/15 0250 04/15/15 0413 04/15/15 1250 04/16/15 0256  WBC 28.6*  < > 26.9* 29.5* 23.3* 18.0* 18.1* 14.6*  NEUTROABS 26.1*  --  25.0*  --   --   --   --   --   HGB 8.4*  < > 7.3* 7.0* 7.9* 6.7* 8.2* 7.8*  HCT 25.5*  < > 21.3* 21.0* 23.8* 20.5* 25.5*  24.4*  MCV 80.2  < > 81.0 84.0 84.4 84.7 83.9 84.4  PLT 296  < > 277 336 399 432* 484* 531*  < > = values in this interval not displayed. Cardiac Enzymes: No results for input(s): CKTOTAL, CKMB, CKMBINDEX, TROPONINI in the last 168 hours. BNP (last 3 results) No results for input(s): BNP in the last 8760 hours.  ProBNP (last 3 results) No results for input(s): PROBNP in the last 8760 hours.  CBG: No results for input(s): GLUCAP in the last 168 hours.  Recent Results (from the past 240 hour(s))  Culture, blood (routine x 2)     Status: None   Collection Time: 04/06/15 11:50 PM  Result Value Ref Range Status   Specimen Description BLOOD RIGHT HAND  Final   Special Requests   Final    BOTTLES DRAWN AEROBIC AND ANAEROBIC AER 10cc ANA 10cc   Culture  Setup Time   Final    GRAM POSITIVE COCCI IN CLUSTERS IN BOTH AEROBIC AND ANAEROBIC BOTTLES CORRECTED RESULTS CALLED TO: W DAVIS,RN AT 1151 04/08/15 BY L BENFIELD PREVIOUSLY REPORTED AS GRAM NEGATIVE COCCI AND GRAM VARIABLE COCCI    Culture   Final    STAPHYLOCOCCUS AUREUS SUSCEPTIBILITIES PERFORMED ON PREVIOUS CULTURE WITHIN THE LAST 5 DAYS. Performed at [facility]    Report Status 04/09/2015 FINAL  Final  Culture, blood (routine x 2)      Status: None   Collection Time: 04/07/15 12:05 AM  Result Value Ref Range Status   Specimen Description BLOOD RIGHT FOREARM  Final   Special Requests   Final    BOTTLES DRAWN AEROBIC AND ANAEROBIC AER 5cc ANA 5cc   Culture  Setup Time   Final    GRAM POSITIVE COCCI IN CLUSTERS IN BOTH AEROBIC AND ANAEROBIC BOTTLES CORRECTED RESULTS CALLED TO: W DAVIS,RN AT 1151 04/08/15 BY L BENFIELD PREVIOUSLY REPORTED AS GRAM NEGATIVE COCCI AND GRAM VARIABLE COCCI    Culture   Final    STAPHYLOCOCCUS AUREUS BACILLUS SPECIES Standardized susceptibility testing for this organism is not available. CRITICAL RESULT CALLED TO, READ BACK BY AND VERIFIED WITH: Hollace Kinnier AT 1318 04/09/15 BY L BENFIELD Performed at [facility]    Report Status 04/09/2015 FINAL  Final   Organism ID, Bacteria STAPHYLOCOCCUS AUREUS  Final      Susceptibility   Staphylococcus aureus - MIC*    CIPROFLOXACIN <=0.5 SENSITIVE Sensitive     ERYTHROMYCIN <=0.25 SENSITIVE Sensitive     GENTAMICIN <=0.5 SENSITIVE Sensitive     OXACILLIN <=0.25 SENSITIVE Sensitive     TETRACYCLINE <=1 SENSITIVE Sensitive     VANCOMYCIN <=0.5 SENSITIVE Sensitive     TRIMETH/SULFA <=10 SENSITIVE Sensitive     CLINDAMYCIN <=0.25 SENSITIVE Sensitive     RIFAMPIN <=0.5 SENSITIVE Sensitive     Inducible Clindamycin NEGATIVE Sensitive     * STAPHYLOCOCCUS AUREUS  MRSA PCR Screening     Status: None   Collection Time: 04/07/15  6:30 AM  Result Value Ref Range Status   MRSA by PCR NEGATIVE NEGATIVE Final    Comment:        The GeneXpert MRSA Assay (FDA approved for NASAL specimens only), is one component of a comprehensive MRSA colonization surveillance program. It is not intended to diagnose MRSA infection nor to guide or monitor treatment for MRSA infections.   Urine culture     Status: None   Collection Time: 04/07/15  7:55 AM  Result Value Ref Range Status  Specimen Description URINE, RANDOM  Final   Special Requests NONE  Final    Culture NO GROWTH 1 DAY  Final   Report Status 04/08/2015 FINAL  Final  Culture, blood (routine x 2)     Status: None   Collection Time: 04/08/15  6:21 PM  Result Value Ref Range Status   Specimen Description BLOOD RIGHT ANTECUBITAL  Final   Special Requests BOTTLES DRAWN AEROBIC ONLY 4CC  Final   Culture NO GROWTH 5 DAYS  Final   Report Status 04/13/2015 FINAL  Final  Culture, routine-abscess     Status: None   Collection Time: 04/11/15  1:54 PM  Result Value Ref Range Status   Specimen Description ABSCESS RIGHT THIGH  Final   Special Requests PATIENT ON FOLLOWING ANCEF VANCOMYCIN  Final   Gram Stain   Final    FEW WBC PRESENT, PREDOMINANTLY PMN NO SQUAMOUS EPITHELIAL CELLS SEEN RARE GRAM POSITIVE COCCI IN PAIRS IN CLUSTERS Performed at [facility]    Culture   Final    FEW STAPHYLOCOCCUS AUREUS Note: RIFAMPIN AND GENTAMICIN SHOULD NOT BE USED AS SINGLE DRUGS FOR TREATMENT OF STAPH INFECTIONS. Performed at [facility]    Report Status 04/14/2015 FINAL  Final   Organism ID, Bacteria STAPHYLOCOCCUS AUREUS  Final      Susceptibility   Staphylococcus aureus - MIC*    CLINDAMYCIN <=0.25 SENSITIVE Sensitive     ERYTHROMYCIN <=0.25 SENSITIVE Sensitive     GENTAMICIN <=0.5 SENSITIVE Sensitive     LEVOFLOXACIN 0.25 SENSITIVE Sensitive     OXACILLIN 0.5 SENSITIVE Sensitive     RIFAMPIN <=0.5 SENSITIVE Sensitive     TRIMETH/SULFA <=10 SENSITIVE Sensitive     VANCOMYCIN 1 SENSITIVE Sensitive     TETRACYCLINE <=1 SENSITIVE Sensitive     MOXIFLOXACIN <=0.25 SENSITIVE Sensitive     * FEW STAPHYLOCOCCUS AUREUS     Studies:  Recent x-ray studies have been reviewed in detail by the Attending Physician  Scheduled Meds:  Scheduled Meds: .  ceFAZolin (ANCEF) IV  2 g Intravenous 3 times per day  . docusate sodium  100 mg Oral BID  . feeding supplement (ENSURE ENLIVE)  237 mL Oral TID BM  . metoprolol tartrate  25 mg Oral BID  . morphine  90 mg Oral 2 times per  day  . morphine   Intravenous 6 times per day  . naloxegol oxalate  25 mg Oral Daily  . sodium chloride  3 mL Intravenous Q12H  . Tdap  0.5 mL Intramuscular Once  . venlafaxine XR  37.5 mg Oral Q breakfast    Time spent on care of this patient: 40 mins   WOODS, Roselind Messier , MD  [facility] Office  [phone] Pager [phone]  On-Call/Text Page:      [email or web address]      password [email or web address]  If 7PM-7AM, please contact night-coverage [email or web address] Password [email or web address] 04/16/2015, 10:19 PM   LOS: 9 days   Care during the described time interval was provided by me .  I have reviewed this patient's available data, including medical history, events of note, physical examination, and all test results as part of my evaluation. I have personally reviewed and interpreted all radiology studies.   Carolyne Littles, MD [phone] Pager

## 2015-04-16 NOTE — Progress Notes (Signed)
Notified by CCDM at 1715 that pt heart rate was elevated. Upon assessment pt heart rate is ranging upper 140-170's, vitas below. Pt asymptomatic during event. Dr. Joseph Art notified. New orders received. Will implement and continue to monitor.    04/16/15 1715 04/16/15 1724 04/16/15 1730  Vitals  BP 91/76 mmHg (!) 88/68 mmHg 123/65 mmHg  MAP (mmHg) 79 72 78  BP Location Right Arm Right Arm --   BP Method Automatic Automatic --   Patient Position (if appropriate) Lying Lying --   Pulse Rate (!) 147 (!) 171 (!) 116  Pulse Rate Source Monitor Monitor --   ECG Heart Rate (!) 147 (!) 161 (!) 116

## 2015-04-16 NOTE — Progress Notes (Signed)
[facility]  Subjective  - Doing well. Ready to go.   Objective 127/75 96 99.1 F (37.3 C) (Oral) 10 100%  Intake/Output Summary (Last 24 hours) at 04/16/15 0734 Last data filed at 04/16/15 0102  Gross per 24 hour  Intake 3608.82 ml  Output   4375 ml  Net -766.18 ml   Wound vac in place  Great toe plantar and lateral demarcation ischemic changes Active range of motion of the foot and ankle intact    Assessment/Planning: Right thigh mycotic aneurysm Plan I and D of right thigh abscess 04/17/2015 by Dr. Imogene Burn NPO past MN   [place], [facility] 04/16/2015 7:34 AM --  Laboratory Lab Results:  Recent Labs  04/15/15 1250 04/16/15 0256  WBC 18.1* 14.6*  HGB 8.2* 7.8*  HCT 25.5* 24.4*  PLT 484* 531*   BMET  Recent Labs  04/15/15 0413 04/16/15 0256  NA 136 136  K 4.2 3.6  CL 108 106  CO2 23 25  GLUCOSE 105* 111*  BUN 12 12  CREATININE 1.20 1.27*  CALCIUM 7.3* 7.3*    COAG Lab Results  Component Value Date   INR 1.44 04/12/2015   INR 1.39 04/09/2015   INR 1.61* 04/08/2015   No results found for: PTT

## 2015-04-16 NOTE — Consult Note (Signed)
Reason for Consult: Exposure femoral vessels, flap coverage Referring Physician: Dr. Early Osmond Location: [facility]- inpatient Date: 9.20.2016  [name] is an 59 y.o. male.  HPI: Patient admitted 04/07/15 after fall at home and on presentation septic. Workup revealed LUE fractures, ischemic appearing right foot with aneurysm right SFA, as well as bacteremia. Per chart review, patient has been injecting self with testosterone and this is suspected to be source of bacteremia and aneurysm. Patient noted to have drainage from right groin 04/11/15 and was taken to OR for debridement, exploration. Has undergone repeat debridement of abscess cavity, ligation of SFA, and currently with wound VAC. Culture from groin and blood cultures MSSA, on Ancef. Right foot changes thought to be due to emboli and is on heparin gtt, though not therapeutic presently. Chart review states patient thought he had blood clot when first noted changes to foot at home and started to take xarelto left over from knee surgery.   Patient c/o pain largely in LUE at this time. Reports ischemic changes to foot are unchanged, no pain over foot. Reports sensation to all toes over right foot with maintained movement.   History significant for bilateral knee replacements, chronic pain and chronic narcotic use.   04/07/2015 ABI R 0.5 monophasic TBI R absent  Past Medical History  Diagnosis Date  . Hypertension   . Chronic knee pain   . Hyperlipemia   . PUD (peptic ulcer disease)     Past Surgical History  Procedure Laterality Date  . I&d extremity Left 07/12/2013    Procedure: I&D Left Thigh Abscess;  Surgeon: [name], MD;  Location: [facility] OR;  Service: Orthopedics;  Laterality: Left;  . Replacement total knee bilateral    . Tonsillectomy    . Hernia repair    . Cholecystectomy    . Appendectomy    . Shoulder surgery    . Elbow surgery    . Femoral artery exploration Right 04/11/2015    Procedure: Resection of infected right  femoral artery aneurysm;  Surgeon: Fransisco Hertz, MD;  Location: [facility] OR;  Service: Vascular;  Laterality: Right;  . Incision and drainage abscess Right 04/11/2015    Procedure: INCISION AND DRAINAGE OF RIGHT THIGH ABSCESS;  Surgeon: Fransisco Hertz, MD;  Location: [facility] OR;  Service: Vascular;  Laterality: Right;  . Incision and drainage abscess Right 04/13/2015    Procedure: INCISION AND DRAINAGE THIGH ABSCESS;  Surgeon: Fransisco Hertz, MD;  Location: [facility] OR;  Service: Vascular;  Laterality: Right;    Family History  Problem Relation Age of Onset  . Heart attack Paternal Grandmother 11  . Heart disease Mother 5    arrythmia  . Stroke Paternal Grandfather   . Stroke Maternal Grandfather     Social History:  reports that he has quit smoking. His smoking use included Cigarettes. He does not have any smokeless tobacco history on file. He reports that he does not drink alcohol or use illicit drugs. Former Runner, broadcasting/film/video.  Allergies:  Allergies  Allergen Reactions  . Imitrex [Sumatriptan] Other (See Comments)    Chest pain, 2004 (tolerates 50 mg prn)  . Verapamil Other (See Comments)    Causes pvc's    Medications: I have reviewed the patient's current medications.  ROS Blood pressure 124/70, pulse 102, temperature 98.9 F (37.2 C), temperature source Oral, resp. rate 16, height  (1.905 m), weight 103.42 kg (228 lb), SpO2 93 %. Physical Exam Alert, NAD Left arm swollen from  fingers to upper arm, erythema present over dorsum wrist and forearm, no open wound, splint in place BLE with edema significant, right foot plantar surface and toes with areas of ischemic changes, sensation grossly intact to all toes, able to flex/extend toes Abdomen soft, obese Right groin  With VAC in place, no cellulitis, scant drainage in cannister Bilateral knee scars  Assessment/Plan: Right SFA mycotic aneurysm, post resection, ligation Exposure femoral vein right  Reviewed gracilis flap for  coverage exposed vessels to prevent bleeding complications. However, remains high risk bleeding problems given anticoagulation. Will need to hold heparin gtt for surgery.  Reviewed incisions medial thigh for harvest gracilis, drains post op. Given significant infection, debridement necrotic tissue, edema, and inflammation soft tissues, may be unable to close skin over muscle flap. If this is true, will continue to utilize [facility] over muscle flap. Discussed long term risk of swelling/lymphedema RLE given above injuries, surgeries. Also discussed risk to hardware in place with regards to infection. Counseled harvest of gracilis should not weaken stability of TKR.   Needs greater elevation LUE, ordered pillows for elevation above heart.   Surgery planned for 04/19/15, currently add on. Provided my contact info, will return 04/18/15 to review plan, answer additional questions.   Glenna Fellows, MD [facility] Plastic & Reconstructive Surgery [phone]

## 2015-04-16 NOTE — Progress Notes (Addendum)
ANTICOAGULATION CONSULT NOTE - Follow Up Consult  Pharmacy Consult for Heparin to Argatroban Indication: Thromboembolism to Right Foot  Allergies  Allergen Reactions  . Imitrex [Sumatriptan] Other (See Comments)    Chest pain, 2004 (tolerates 50 mg prn)  . Verapamil Other (See Comments)    Causes pvc's    Patient Measurements: Height:  (190.5 cm) Weight: 228 lb (103.42 kg) IBW/kg (Calculated) : 84.5 Heparin dosing weight: 103 kg  Vital Signs: Temp: 98.8 F (37.1 C) (09/20 1942) Temp Source: Oral (09/20 1942) BP: 114/83 mmHg (09/20 2000) Pulse Rate: 89 (09/20 2000)  Labs:  Recent Labs  04/15/15 0413  04/15/15 1250 04/15/15 1922 04/16/15 0256 04/16/15 1149 04/16/15 1825 04/16/15 2135  HGB 6.7*  --  8.2*  --  7.8*  --   --   --   HCT 20.5*  --  25.5*  --  24.4*  --   --   --   PLT 432*  --  484*  --  531*  --   --   --   APTT  --   --   --   --   --  57* 85* 93*  HEPARINUNFRC  --   < > <0.10* <0.10* <0.10* <0.10*  --   --   CREATININE 1.20  --   --   --  1.27*  --   --   --   < > = values in this interval not displayed.  Estimated Creatinine Clearance: 84.6 mL/min (by C-G formula based on Cr of 1.27).   Medications:  Scheduled:  .  ceFAZolin (ANCEF) IV  2 g Intravenous 3 times per day  . docusate sodium  100 mg Oral BID  . feeding supplement (ENSURE ENLIVE)  237 mL Oral TID BM  . metoprolol tartrate  25 mg Oral BID  . morphine  90 mg Oral 2 times per day  . morphine   Intravenous 6 times per day  . naloxegol oxalate  25 mg Oral Daily  . sodium chloride  3 mL Intravenous Q12H  . Tdap  0.5 mL Intramuscular Once  . venlafaxine XR  37.5 mg Oral Q breakfast   Infusions:  . sodium chloride 30 mL/hr at 04/16/15 0727  . 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 Pharmacy consulted for  heparin for right foot thromboembolism secondary to R groin aneurysm. He is noted s/p washout of right thigh abscess cavity. He is also noted for I&D on 9/21 surgery on 9/23.  HLs undetectable still and AT3 62 (slightly low - should be at least 75) on heparin 3800 units/hr + bolus. Will hit hard max at 3999 units/h - patient still subtherapeutic on 12 consecutive undetectable levels. Plans for I&D on 9/21 surgery on 9/23 (see plastic surgery note on 9/19) but may need to consider switching to lovenox and timing appropriately around surgery. Checked aPTT - Only slightly low 57 (66-102)  **Per Dr. Darrick Penna (Vasc surg) - start argatroban and d/c heparin (does not want lovenox due to pending surgeries). Will need to f/u long-term anticoag plan - lovenox may be better option post-surgeries  No IV line or bleed issues per RN. Baseline aPTT 34 prior to heparin IV on 9/13. APTT 57 on heparin today  Goal of Therapy:  Heparin level 0.3-0.7 units/ml  PTT = 50 to 90 seconds Monitor platelets by anticoagulation protocol: Yes   Plan:   -D/c heparin and switch to argatroban per Dr. Darrick Penna (Vasc Surg) -Start argatroban 49mcg/kg/min -aPTT 2 hours after start of argatroban therapy and adjust -aPTT every 2 hours until two therapeutic levels are obtained. -CBC and aPTT at least once daily (Consider q12h aPTT for patients multiple organ failure). -Monitor for s/sx of bleeding -Repeat I&D 9/21; Plan for gracilis flap 9/23 -F/u long-term anticoag plan  Babs Bertin, PharmD Clinical Pharmacist Pager [phone] 04/16/2015 10:08 PM   04/16/15 1910 pm Initial PTT within range at 85 seconds Plan: No change in argatroban rate Recheck PTT at 2130 pm  04/16/15 10 PM PTT = 95 seconds Plan: Decrease  Argatroban to 0.8 mcg / kg / min, Recheck PTT at 1 am   Thank you Okey Regal, PharmD [phone]

## 2015-04-16 NOTE — Progress Notes (Signed)
Nutrition Follow-up  DOCUMENTATION CODES:   Not applicable  INTERVENTION:    Continue Ensure Enlive, increase to TID, each supplement provides 350 kcal and 20 grams of protein  NUTRITION DIAGNOSIS:   Inadequate oral intake related to poor appetite as evidenced by per patient/family report.  Ongoing  GOAL:   Patient will meet greater than or equal to 90% of their needs  Progressing  MONITOR:   PO intake, Supplement acceptance, Labs, Weight trends  REASON FOR ASSESSMENT:   Malnutrition Screening Tool    ASSESSMENT:   59 year old gentleman history of peptic ulcer disease, history of MRSA abscesses, hyperlipidemia, peripheral neuropathy, chronic pain who presented to the ED with severe left hand and upper extremity pain. Patient was seen in the ED and noted to be septic with heart rate as high as 152 rest her rate of 28 initial blood pressure was 80/68.  Patient injected his right groin with testosterone 3 weeks prior to admit - required emergent surgical I&D 9/15 - went back to OR 9/17 and 9/19 - now with [facility] - Plastics consulted for eventual wound closure. Currently NPO. Previously on a regular diet with poor intake of meals, 25-50% meal completion. Is also receiving Ensure Enlive BID.  Diet Order:  Diet NPO time specified  Skin:  Reviewed, no issues  Last BM:  9/16  Height:   Ht Readings from Last 1 Encounters:  04/06/15  (1.905 m)    Weight:   Wt Readings from Last 1 Encounters:  04/06/15 228 lb (103.42 kg)    Ideal Body Weight:  89.1 kg  BMI:  Body mass index is 28.5 kg/(m^2).  Estimated Nutritional Needs:   Kcal:  2300-2500  Protein:  110-130 gm  Fluid:  2.3-2.5 L  EDUCATION NEEDS:   No education needs identified at this time  Joaquin Courts, RD, LDN, CNSC Pager [phone] After Hours Pager [phone]

## 2015-04-16 NOTE — Progress Notes (Signed)
Dr. Joseph Art paged to let him know pt wants an enema to assist with a BM.

## 2015-04-16 NOTE — Progress Notes (Signed)
Pt states he would like to wait until after surgery for the enema. Will continue to monitor.

## 2015-04-17 ENCOUNTER — Encounter (HOSPITAL_COMMUNITY)
Admission: EM | Disposition: A | Discharge status: Rehabilitation Facility | Payer: Self-pay | Source: Home / Self Care | Attending: Internal Medicine

## 2015-04-17 ENCOUNTER — Encounter (HOSPITAL_COMMUNITY): Payer: Medicaid Other

## 2015-04-17 ENCOUNTER — Inpatient Hospital Stay (HOSPITAL_COMMUNITY): Payer: Medicaid Other | Admitting: Anesthesiology

## 2015-04-17 ENCOUNTER — Encounter (HOSPITAL_COMMUNITY): Payer: Self-pay | Admitting: Certified Registered"

## 2015-04-17 DIAGNOSIS — L02415 Cutaneous abscess of right lower limb: Secondary | ICD-10-CM

## 2015-04-17 HISTORY — PX: I&D EXTREMITY: SHX5045

## 2015-04-17 LAB — CBC
HEMATOCRIT: 22.7 % — AB (ref 39.0–52.0)
Hemoglobin: 7.2 g/dL — ABNORMAL LOW (ref 13.0–17.0)
MCH: 27 pg (ref 26.0–34.0)
MCHC: 31.7 g/dL (ref 30.0–36.0)
MCV: 85 fL (ref 78.0–100.0)
Platelets: 555 10*3/uL — ABNORMAL HIGH (ref 150–400)
RBC: 2.67 MIL/uL — ABNORMAL LOW (ref 4.22–5.81)
RDW: 17.2 % — AB (ref 11.5–15.5)
WBC: 10.1 10*3/uL (ref 4.0–10.5)

## 2015-04-17 LAB — APTT
aPTT: 104 seconds — ABNORMAL HIGH (ref 24–37)
aPTT: 105 seconds — ABNORMAL HIGH (ref 24–37)

## 2015-04-17 SURGERY — IRRIGATION AND DEBRIDEMENT EXTREMITY
Anesthesia: General | Laterality: Right

## 2015-04-17 SURGERY — IRRIGATION AND DEBRIDEMENT EXTREMITY
Anesthesia: General | Site: Leg Upper | Laterality: Left

## 2015-04-17 MED ORDER — FENTANYL CITRATE (PF) 100 MCG/2ML IJ SOLN
INTRAMUSCULAR | Status: AC
  Administered 2015-04-17: 50 ug via INTRAVENOUS
  Administered 2015-04-17 (×2): 25 ug via INTRAVENOUS
  Administered 2015-04-17: 50 ug via INTRAVENOUS
  Administered 2015-04-17: 25 ug via INTRAVENOUS
  Administered 2015-04-17 (×3): 50 ug via INTRAVENOUS
  Administered 2015-04-17 (×2): 25 ug via INTRAVENOUS
  Filled 2015-04-17: qty 2

## 2015-04-17 MED ORDER — PROPOFOL 10 MG/ML IV BOLUS
INTRAVENOUS | Status: DC | PRN
  Administered 2015-04-17: 200 mg via INTRAVENOUS

## 2015-04-17 MED ORDER — FENTANYL CITRATE (PF) 100 MCG/2ML IJ SOLN
INTRAMUSCULAR | Status: AC
  Filled 2015-04-17: qty 2

## 2015-04-17 MED ORDER — 0.9 % SODIUM CHLORIDE (POUR BTL) OPTIME
TOPICAL | Status: DC | PRN
  Administered 2015-04-17: 1000 mL

## 2015-04-17 MED ORDER — MIDAZOLAM HCL 2 MG/2ML IJ SOLN
INTRAMUSCULAR | Status: AC
  Administered 2015-04-17: 2 mg via INTRAVENOUS
  Filled 2015-04-17: qty 2

## 2015-04-17 MED ORDER — MEPERIDINE HCL 25 MG/ML IJ SOLN: 6.2500 mg | INTRAMUSCULAR | Status: DC | PRN

## 2015-04-17 MED ORDER — PROMETHAZINE HCL 25 MG/ML IJ SOLN: 6.2500 mg | INTRAMUSCULAR | Status: DC | PRN

## 2015-04-17 MED ORDER — MIDAZOLAM HCL 2 MG/2ML IJ SOLN
INTRAMUSCULAR | Status: AC
  Filled 2015-04-17: qty 4

## 2015-04-17 MED ORDER — LIDOCAINE HCL (CARDIAC) 20 MG/ML IV SOLN
INTRAVENOUS | Status: DC | PRN
  Administered 2015-04-17: 100 mg via INTRAVENOUS

## 2015-04-17 MED ORDER — HEPARIN SODIUM (PORCINE) 1000 UNIT/ML IJ SOLN
INTRAMUSCULAR | Status: AC
  Filled 2015-04-17: qty 1

## 2015-04-17 MED ORDER — FENTANYL CITRATE (PF) 100 MCG/2ML IJ SOLN
25.0000 ug | INTRAMUSCULAR | Status: DC | PRN
  Administered 2015-04-17 (×3): 50 ug via INTRAVENOUS

## 2015-04-17 MED ORDER — PROPOFOL 10 MG/ML IV BOLUS
INTRAVENOUS | Status: AC
  Filled 2015-04-17: qty 20

## 2015-04-17 MED ORDER — ONDANSETRON HCL 4 MG/2ML IJ SOLN
INTRAMUSCULAR | Status: AC
  Filled 2015-04-17: qty 2

## 2015-04-17 MED ORDER — ONDANSETRON HCL 4 MG/2ML IJ SOLN
INTRAMUSCULAR | Status: DC | PRN
  Administered 2015-04-17: 4 mg via INTRAVENOUS

## 2015-04-17 MED ORDER — MIDAZOLAM HCL 5 MG/5ML IJ SOLN
INTRAMUSCULAR | Status: DC | PRN
  Administered 2015-04-17: 1 mg via INTRAVENOUS

## 2015-04-17 MED ORDER — FENTANYL CITRATE (PF) 250 MCG/5ML IJ SOLN
INTRAMUSCULAR | Status: AC
Start: 2015-04-17 — End: 2015-04-17
  Filled 2015-04-17: qty 5

## 2015-04-17 MED ORDER — MIDAZOLAM HCL 2 MG/2ML IJ SOLN
0.5000 mg | Freq: Once | INTRAMUSCULAR | Status: AC | PRN
  Administered 2015-04-17: 2 mg via INTRAVENOUS

## 2015-04-17 MED ORDER — ARGATROBAN 50 MG/50ML IV SOLN
0.3900 ug/kg/min | INTRAVENOUS | Status: DC
  Administered 2015-04-17: 0.39 ug/kg/min via INTRAVENOUS
  Administered 2015-04-17: 0.56 ug/kg/min via INTRAVENOUS
  Filled 2015-04-17 (×3): qty 50

## 2015-04-17 MED ORDER — LACTATED RINGERS IV SOLN
INTRAVENOUS | Status: DC | PRN
  Administered 2015-04-17: 15:00:00 via INTRAVENOUS

## 2015-04-17 MED ORDER — OXYCODONE-ACETAMINOPHEN 5-325 MG PO TABS
ORAL_TABLET | ORAL | Status: AC
  Filled 2015-04-17: qty 1

## 2015-04-17 SURGICAL SUPPLY — 35 items
BANDAGE ELASTIC 4 VELCRO ST LF (GAUZE/BANDAGES/DRESSINGS) IMPLANT
BANDAGE ELASTIC 6 VELCRO ST LF (GAUZE/BANDAGES/DRESSINGS) IMPLANT
BNDG GAUZE ELAST 4 BULKY (GAUZE/BANDAGES/DRESSINGS) IMPLANT
CANISTER SUCTION 2500CC (MISCELLANEOUS) ×3 IMPLANT
COVER SURGICAL LIGHT HANDLE (MISCELLANEOUS) ×6 IMPLANT
DRAPE INCISE IOBAN 66X45 STRL (DRAPES) IMPLANT
DRAPE ORTHO SPLIT 77X108 STRL (DRAPES) ×6
DRAPE PROXIMA HALF (DRAPES) ×3 IMPLANT
DRAPE SURG ORHT 6 SPLT 77X108 (DRAPES) IMPLANT
DRSG VAC ATS MED SENSATRAC (GAUZE/BANDAGES/DRESSINGS) ×2 IMPLANT
DRSG VERSA FOAM LRG 10X15 (GAUZE/BANDAGES/DRESSINGS) ×3 IMPLANT
ELECT REM PT RETURN 9FT ADLT (ELECTROSURGICAL) ×3
ELECTRODE REM PT RTRN 9FT ADLT (ELECTROSURGICAL) ×1 IMPLANT
GAUZE SPONGE 4X4 12PLY STRL (GAUZE/BANDAGES/DRESSINGS) ×3 IMPLANT
GLOVE BIO SURGEON STRL SZ7 (GLOVE) ×3 IMPLANT
GLOVE BIOGEL PI IND STRL 6.5 (GLOVE) IMPLANT
GLOVE BIOGEL PI IND STRL 7.5 (GLOVE) ×1 IMPLANT
GLOVE BIOGEL PI INDICATOR 6.5 (GLOVE) ×4
GLOVE BIOGEL PI INDICATOR 7.5 (GLOVE) ×2
GLOVE SKINSENSE NS SZ7.0 (GLOVE) ×2
GLOVE SKINSENSE STRL SZ7.0 (GLOVE) IMPLANT
GLOVE SURG SS PI 7.0 STRL IVOR (GLOVE) ×2 IMPLANT
GOWN STRL REUS W/ TWL LRG LVL3 (GOWN DISPOSABLE) ×3 IMPLANT
GOWN STRL REUS W/TWL LRG LVL3 (GOWN DISPOSABLE) ×9
KIT BASIN OR (CUSTOM PROCEDURE TRAY) ×3 IMPLANT
KIT ROOM TURNOVER OR (KITS) ×3 IMPLANT
NS IRRIG 1000ML POUR BTL (IV SOLUTION) ×3 IMPLANT
PACK GENERAL/GYN (CUSTOM PROCEDURE TRAY) ×2 IMPLANT
PAD ARMBOARD 7.5X6 YLW CONV (MISCELLANEOUS) ×6 IMPLANT
SUT ETHILON 3 0 PS 1 (SUTURE) IMPLANT
SUT MNCRL AB 4-0 PS2 18 (SUTURE) IMPLANT
SUT VIC AB 2-0 CTX 36 (SUTURE) IMPLANT
SUT VIC AB 3-0 SH 27 (SUTURE)
SUT VIC AB 3-0 SH 27X BRD (SUTURE) IMPLANT
WATER STERILE IRR 1000ML POUR (IV SOLUTION) ×1 IMPLANT

## 2015-04-17 SURGICAL SUPPLY — 57 items
BANDAGE ELASTIC 4 VELCRO ST LF (GAUZE/BANDAGES/DRESSINGS) IMPLANT
BANDAGE ESMARK 6X9 LF (GAUZE/BANDAGES/DRESSINGS) IMPLANT
BNDG CMPR 9X6 STRL LF SNTH (GAUZE/BANDAGES/DRESSINGS)
BNDG ESMARK 6X9 LF (GAUZE/BANDAGES/DRESSINGS)
CANISTER SUCTION 2500CC (MISCELLANEOUS) ×3 IMPLANT
CLIP TI MEDIUM 24 (CLIP) ×3 IMPLANT
CLIP TI WIDE RED SMALL 24 (CLIP) ×3 IMPLANT
COVER PROBE W GEL 5X96 (DRAPES) ×3 IMPLANT
CUFF TOURNIQUET SINGLE 24IN (TOURNIQUET CUFF) IMPLANT
CUFF TOURNIQUET SINGLE 34IN LL (TOURNIQUET CUFF) IMPLANT
CUFF TOURNIQUET SINGLE 44IN (TOURNIQUET CUFF) IMPLANT
DRAIN CHANNEL 15F RND FF W/TCR (WOUND CARE) IMPLANT
DRAPE C-ARM 42X72 X-RAY (DRAPES) ×3 IMPLANT
DRSG COVADERM 4X10 (GAUZE/BANDAGES/DRESSINGS) IMPLANT
DRSG COVADERM 4X8 (GAUZE/BANDAGES/DRESSINGS) IMPLANT
DRSG VAC ATS MED SENSATRAC (GAUZE/BANDAGES/DRESSINGS) ×3 IMPLANT
DRSG VERSA FOAM LRG 10X15 (GAUZE/BANDAGES/DRESSINGS) ×2 IMPLANT
ELECT REM PT RETURN 9FT ADLT (ELECTROSURGICAL) ×3
ELECTRODE REM PT RTRN 9FT ADLT (ELECTROSURGICAL) ×1 IMPLANT
EVACUATOR SILICONE 100CC (DRAIN) IMPLANT
GLOVE BIO SURGEON STRL SZ7 (GLOVE) ×3 IMPLANT
GLOVE BIOGEL PI IND STRL 6.5 (GLOVE) IMPLANT
GLOVE BIOGEL PI IND STRL 7.5 (GLOVE) ×1 IMPLANT
GLOVE BIOGEL PI INDICATOR 6.5 (GLOVE) ×2
GLOVE BIOGEL PI INDICATOR 7.5 (GLOVE) ×4
GOWN STRL REUS W/ TWL LRG LVL3 (GOWN DISPOSABLE) ×3 IMPLANT
GOWN STRL REUS W/TWL LRG LVL3 (GOWN DISPOSABLE) ×9
INSERT FOGARTY SM (MISCELLANEOUS) IMPLANT
KIT BASIN OR (CUSTOM PROCEDURE TRAY) ×3 IMPLANT
KIT ROOM TURNOVER OR (KITS) ×3 IMPLANT
MARKER GRAFT CORONARY BYPASS (MISCELLANEOUS) IMPLANT
NS IRRIG 1000ML POUR BTL (IV SOLUTION) ×6 IMPLANT
PACK PERIPHERAL VASCULAR (CUSTOM PROCEDURE TRAY) ×3 IMPLANT
PAD ARMBOARD 7.5X6 YLW CONV (MISCELLANEOUS) ×6 IMPLANT
PADDING CAST COTTON 6X4 STRL (CAST SUPPLIES) IMPLANT
SPONGE GAUZE 4X4 12PLY STER LF (GAUZE/BANDAGES/DRESSINGS) ×3 IMPLANT
SPONGE SURGIFOAM ABS GEL 100 (HEMOSTASIS) IMPLANT
STAPLER VISISTAT 35W (STAPLE) IMPLANT
SUT ETHILON 3 0 PS 1 (SUTURE) IMPLANT
SUT GORETEX 5 0 TT13 24 (SUTURE) IMPLANT
SUT GORETEX 6.0 TT13 (SUTURE) IMPLANT
SUT MNCRL AB 4-0 PS2 18 (SUTURE) ×9 IMPLANT
SUT PROLENE 5 0 C 1 24 (SUTURE) ×3 IMPLANT
SUT PROLENE 6 0 BV (SUTURE) ×6 IMPLANT
SUT PROLENE 7 0 BV 1 (SUTURE) IMPLANT
SUT SILK 2 0 FS (SUTURE) ×3 IMPLANT
SUT SILK 3 0 (SUTURE)
SUT SILK 3-0 18XBRD TIE 12 (SUTURE) IMPLANT
SUT VIC AB 2-0 CT1 27 (SUTURE) ×6
SUT VIC AB 2-0 CT1 TAPERPNT 27 (SUTURE) ×2 IMPLANT
SUT VIC AB 3-0 SH 27 (SUTURE) ×9
SUT VIC AB 3-0 SH 27X BRD (SUTURE) ×3 IMPLANT
TAPE CLOTH SURG 6X10 WHT LF (GAUZE/BANDAGES/DRESSINGS) ×2 IMPLANT
TRAY FOLEY W/METER SILVER 16FR (SET/KITS/TRAYS/PACK) ×3 IMPLANT
TUBING EXTENTION W/L.L. (IV SETS) ×3 IMPLANT
UNDERPAD 30X30 INCONTINENT (UNDERPADS AND DIAPERS) ×3 IMPLANT
WATER STERILE IRR 1000ML POUR (IV SOLUTION) ×3 IMPLANT

## 2015-04-17 NOTE — Progress Notes (Signed)
ANTICOAGULATION CONSULT NOTE - Follow Up Consult  Pharmacy Consult for Heparin to Argatroban Indication: Thromboembolism to Right Foot  Allergies  Allergen Reactions  . Imitrex [Sumatriptan] Other (See Comments)    Chest pain, 2004 (tolerates 50 mg prn)  . Verapamil Other (See Comments)    Causes pvc's    Patient Measurements: Height:  (190.5 cm) Weight: 228 lb (103.42 kg) IBW/kg (Calculated) : 84.5 Heparin dosing weight: 103 kg  Vital Signs: Temp: 99.5 F (37.5 C) (09/21 0405) Temp Source: Oral (09/21 0405) BP: 126/69 mmHg (09/21 0405) Pulse Rate: 102 (09/21 0405)  Labs:  Recent Labs  04/15/15 0413  04/15/15 1250 04/15/15 1922 04/16/15 0256  04/16/15 1149 04/16/15 1825 04/16/15 2135 04/17/15 0415  HGB 6.7*  --  8.2*  --  7.8*  --   --   --   --  7.2*  HCT 20.5*  --  25.5*  --  24.4*  --   --   --   --  22.7*  PLT 432*  --  484*  --  531*  --   --   --   --  555*  APTT  --   --   --   --   --   < > 57* 85* 93* 104*  HEPARINUNFRC  --   < > <0.10* <0.10* <0.10*  --  <0.10*  --   --   --   CREATININE 1.20  --   --   --  1.27*  --   --   --   --   --   < > = values in this interval not displayed.  Estimated Creatinine Clearance: 83.6 mL/min (by C-G formula based on Cr of 1.27).   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 Pharmacy consulted for  heparin for right foot thromboembolism secondary to R groin aneurysm. He is noted s/p washout of right thigh abscess cavity. He is also noted for I&D on 9/21 and surgery on 9/23. Heparin levels remained undetectable on 3800 units/hr (hard max of 3999 units/hr), so Argatroban started. aPTT 104 sec (supratherapeutic) on 0.8 mcg/kg/min.  **Per Dr. Darrick Penna (Vasc surg) - start argatroban and d/c heparin (does not want lovenox due to pending surgeries). Will need to f/u long-term anticoag plan - lovenox may be better option  post-surgeries   Goal of Therapy:  PTT = 50 to 90 seconds Monitor platelets by anticoagulation protocol: Yes   Plan:   -Decrease argatroban to 0.56 mcg/kg/min -aPTT in 2 hours and every 2 hours until 2 therapeutic levels obtained -CBC and aPTT at least once daily -F/u long-term anticoag plan  Christoper Fabian, PharmD, BCPS Clinical pharmacist, pager [phone] 04/17/2015 5:18 AM

## 2015-04-17 NOTE — Progress Notes (Signed)
[place] TEAM 1 - Stepdown/ICU TEAM PROGRESS NOTE  [name] YNW:[id number] DOB: August 20, 1955 DOA: 04/06/2015 PCP: [name], MD  Admit HPI / Brief Narrative: 59 y.o. Male with PUD, HLD, history of MRSA abscesses, peripheral neuropathy, and chronic pain who presented to [facility] [place] [facility] with severe left hand pain. He was found to be septic with acute kidney injury and left hand cellulitis. After admission he was noted to have a dusky swollen right foot. The patient reported that approximately 2 weeks prior he started feeling poorly and eating less. 9/7 he could not feel his right foot and fell as a result. He landed on his left wrist, shoulder, and back side. Since then his left hand has been very painful and his wrist and arm had begun to swell. He has developed thick erythematous streaking on his left arm.    In the ER: Lactic acid was 4.16, INR is 2.26 (not on anticoag), PTT is 24.7, WBC is 31.3, mag 1.3, potassium 2.7, procalcitonin 9.72. BP was 80/68 He received 3L of IV bolus in the ER.  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 Reportedly the patient injected his groin with testosterone 3 weeks ago.  HPI/Subjective: The pt is seen in the PACU.  He is alert and conversant.  He had difficulty w/ pain control initially, and it appears this may be related to his MS Contin being held this morning.  He currently denies cp, sob, n/v or abdom pain.    Assessment/Plan:  Sinus tachycardia > SVT > [facility]  Cardiology has completed an evaluation and recommends adenosine 6 mg should the patient suffer another episode of atrial tachycardia  Severe sepsis - MSSA bacteremia  -Multiple potential initial sources: L hand cellulitis, UTI/pyelonephritis, R groin puncture/aneursym - I suspect his initial site was the R groin injection site  -Patient was hypotensive, tachycardic, with a white count of 31.3 on initial workup = sepsis  -Continue antibiotics  as per ID -ID suggest will need TEE when more stable clinically - Cards felt  that TTE would suffice - TTE did not note vegetation, but given serious nature of his illness I feel most comfortable w/ TEE to better evaluate his valves and help determine if a longer course of abx is indicated, or even valve procedure - will ask Cards to reconsider TEE in AM   Right superficial femoral artery mycotic aneurysm/phlegmon with apparent right foot thromboembolism Patient injected his right groin with testosterone 3 weeks prior to admit - required emergent surgical I&D 9/15 - ongoing care per vascular surgery - went back to OR 9/17, 9/19, and again 9/21 - now on [facility] - Plastics consulted for eventual wound closure   Acute blood loss anemia Due to infected vascular region s/p multiple surgeries + sepsis - transfuse as needed to keep Hgb 7.0 or >  Acute renal failure Creatinine is 0.9 at baseline - 4.4 on admission - follow trend to assure stability    Left hand cellulitis with radius fracture - history of MRSA abscess Evaluated by [name] - to follow-up in his office in approximately 2 weeks  Possible pyelonephritis Urinalysis suggestive of infection - adequately covered with broad-spectrum empiric antibiotics - urine culture not helpful   Elevated INR with coagulopathy Felt to be due to sepsis - resolved   Hypokalemia and hypomagnesemia Recurrent - replaced to stable levels at this time   Chronic pain has been on pain medications for 12 years -  managed by Dr. [name] [facility]?) - also takes high dose scheduled klonopin - pain currently controlled on PCA - will ask RN to make sure pt gets his MS Contin even on mornings of procedures   Code Status: FULL Family Communication: no family allowed in PACU where pt seen  Disposition Plan: SDU  Consultants: Hand Surgery Vascular Surgery  ID Cardiology - [place]  Procedures: 9/15 - Right thigh abscess incision and drainage + excision of infected  right superficial femoral artery aneurysm 9/17 - Repeat incision and drainage of right thigh abscess  Antibiotics: Zosyn 9/10 > 9/12 Vancomycin 9/10 > 9/15 Cefazolin 9/14 >  DVT prophylaxis: argatroban  Objective: Blood pressure 121/72, pulse 109, temperature 98.4 F (36.9 C), temperature source Oral, resp. rate 22, height  (1.905 m), weight 103.42 kg (228 lb), SpO2 100 %.  Intake/Output Summary (Last 24 hours) at 04/17/15 1705 Last data filed at 04/17/15 1559  Gross per 24 hour  Intake 2065.89 ml  Output   4050 ml  Net -1984.11 ml   Exam: General: No acute respiratory distress  Lungs: Clear to auscultation bilaterally w/o wheeze  Cardiovascular: RRR  Abdomen: Nontender, nondistended, soft, bowel sounds positive, no rebound, no ascites, no appreciable mass Extremities: left hand in a splint - the right foot has persistent areas of deep purple discoloration/ischemia - right groin dressed and dry at present    Data Reviewed: Basic Metabolic Panel:  Recent Labs Lab 04/11/15 0426  04/11/15 1459 04/12/15 0350 04/13/15 1254 04/14/15 0250 04/15/15 0413 04/16/15 0256  NA 137  < > 138 134* 139  --  136 136  K 4.3  < > 4.3 3.3* 3.3*  --  4.2 3.6  CL 108  --   --  107 112*  --  108 106  CO2 17*  --   --  19* 21*  --  23 25  GLUCOSE 94  < > 109* 131* 103*  --  105* 111*  BUN 21*  --   --  18 14  --  12 12  CREATININE 1.63*  --   --  1.41* 1.34*  --  1.20 1.27*  CALCIUM 7.6*  --   --  6.8* 7.0*  --  7.3* 7.3*  MG 1.3*  --   --  1.6*  --  1.8 1.7  --   PHOS  --   --   --   --   --  3.5 3.2  --   < > = values in this interval not displayed.  CBC:  Recent Labs Lab 04/12/15 0350  04/14/15 0250 04/15/15 0413 04/15/15 1250 04/16/15 0256 04/17/15 0415  WBC 26.9*  < > 23.3* 18.0* 18.1* 14.6* 10.1  NEUTROABS 25.0*  --   --   --   --   --   --   HGB 7.3*  < > 7.9* 6.7* 8.2* 7.8* 7.2*  HCT 21.3*  < > 23.8* 20.5* 25.5* 24.4* 22.7*  MCV 81.0  < > 84.4 84.7 83.9 84.4 85.0   PLT 277  < > 399 432* 484* 531* 555*  < > = values in this interval not displayed.  Liver Function Tests:  Recent Labs Lab 04/11/15 0426 04/12/15 0350 04/13/15 1254 04/15/15 0413 04/16/15 0256  AST 41 22 27  --  37  ALT 24 12* 10*  --  11*  ALKPHOS 151* 92 150*  --  274*  BILITOT 1.5* 0.9 1.1  --  0.8  PROT 5.8* 5.0*  5.3*  --  5.7*  ALBUMIN 1.2* 1.1* 1.1* 1.0* 1.1*   Coags:  Recent Labs Lab 04/12/15 0350  INR 1.44    Recent Labs Lab 04/16/15 1149 04/16/15 1825 04/16/15 2135 04/17/15 0415 04/17/15 0845  APTT 57* 85* 93* 104* 105*    Recent Results (from the past 240 hour(s))  Culture, blood (routine x 2)     Status: None   Collection Time: 04/08/15  6:21 PM  Result Value Ref Range Status   Specimen Description BLOOD RIGHT ANTECUBITAL  Final   Special Requests BOTTLES DRAWN AEROBIC ONLY 4CC  Final   Culture NO GROWTH 5 DAYS  Final   Report Status 04/13/2015 FINAL  Final  Culture, routine-abscess     Status: None   Collection Time: 04/11/15  1:54 PM  Result Value Ref Range Status   Specimen Description ABSCESS RIGHT THIGH  Final   Special Requests PATIENT ON FOLLOWING ANCEF VANCOMYCIN  Final   Gram Stain   Final    FEW WBC PRESENT, PREDOMINANTLY PMN NO SQUAMOUS EPITHELIAL CELLS SEEN RARE GRAM POSITIVE COCCI IN PAIRS IN CLUSTERS Performed at [facility]    Culture   Final    FEW STAPHYLOCOCCUS AUREUS Note: RIFAMPIN AND GENTAMICIN SHOULD NOT BE USED AS SINGLE DRUGS FOR TREATMENT OF STAPH INFECTIONS. Performed at [facility]    Report Status 04/14/2015 FINAL  Final   Organism ID, Bacteria STAPHYLOCOCCUS AUREUS  Final      Susceptibility   Staphylococcus aureus - MIC*    CLINDAMYCIN <=0.25 SENSITIVE Sensitive     ERYTHROMYCIN <=0.25 SENSITIVE Sensitive     GENTAMICIN <=0.5 SENSITIVE Sensitive     LEVOFLOXACIN 0.25 SENSITIVE Sensitive     OXACILLIN 0.5 SENSITIVE Sensitive     RIFAMPIN <=0.5 SENSITIVE Sensitive     TRIMETH/SULFA <=10  SENSITIVE Sensitive     VANCOMYCIN 1 SENSITIVE Sensitive     TETRACYCLINE <=1 SENSITIVE Sensitive     MOXIFLOXACIN <=0.25 SENSITIVE Sensitive     * FEW STAPHYLOCOCCUS AUREUS     Studies:   Recent x-ray studies have been reviewed in detail by the Attending Physician  Scheduled Meds:  Scheduled Meds: . [MAR Hold]  ceFAZolin (ANCEF) IV  2 g Intravenous 3 times per day  . [MAR Hold] docusate sodium  100 mg Oral BID  . [MAR Hold] feeding supplement (ENSURE ENLIVE)  237 mL Oral TID BM  . fentaNYL      . fentaNYL      . [MAR Hold] metoprolol tartrate  25 mg Oral BID  . midazolam      . [MAR Hold] morphine  90 mg Oral 2 times per day  . [MAR Hold] morphine   Intravenous 6 times per day  . [MAR Hold] naloxegol oxalate  25 mg Oral Daily  . oxyCODONE-acetaminophen      . [MAR Hold] sodium chloride  3 mL Intravenous Q12H  . [MAR Hold] Tdap  0.5 mL Intramuscular Once  . [MAR Hold] venlafaxine XR  37.5 mg Oral Q breakfast    Time spent on care of this patient: 25 mins   [facility] T , MD   [facility] Office  [phone] Pager - Text Page per Loretha Stapler as per below:  On-Call/Text Page:      [email or web address]      password [email or web address]  If 7PM-7AM, please contact night-coverage [email or web address] Password [email or web address] 04/17/2015, 5:05 PM   LOS: 10 days

## 2015-04-17 NOTE — Anesthesia Preprocedure Evaluation (Addendum)
Anesthesia Evaluation  Patient identified by MRN, date of birth, ID band Patient awake    Reviewed: Allergy & Precautions, NPO status , Patient's Chart, lab work & pertinent test results, reviewed documented beta blocker date and time   History of Anesthesia Complications Negative for: history of anesthetic complications  Airway Mallampati: II  TM Distance: >3 FB Neck ROM: Full    Dental  (+) Dental Advisory Given   Pulmonary COPD,  COPD inhaler, former smoker,    breath sounds clear to auscultation       Cardiovascular hypertension, Pt. on medications and Pt. on home beta blockers (-) angina+ Peripheral Vascular Disease   Rhythm:Regular Rate:Normal  04/15/15 ECHO: EF 55-60%, valves OK   Neuro/Psych negative neurological ROS     GI/Hepatic Neg liver ROS, GERD  Medicated and Controlled,  Endo/Other    Renal/GU ARFRenal disease     Musculoskeletal   Abdominal   Peds  Hematology  (+) Blood dyscrasia (Hb 7.2), ,   Anesthesia Other Findings   Reproductive/Obstetrics                           Anesthesia Physical Anesthesia Plan  ASA: III  Anesthesia Plan: General   Post-op Pain Management:    Induction: Intravenous  Airway Management Planned: LMA  Additional Equipment:   Intra-op Plan:   Post-operative Plan:   Informed Consent: I have reviewed the patients History and Physical, chart, labs and discussed the procedure including the risks, benefits and alternatives for the proposed anesthesia with the patient or authorized representative who has indicated his/her understanding and acceptance.   Dental advisory given  Plan Discussed with: CRNA and Surgeon  Anesthesia Plan Comments: (Plan routine monitors, GA- LMA OK)        Anesthesia Quick Evaluation

## 2015-04-17 NOTE — Op Note (Signed)
    OPERATIVE NOTE   PROCEDURE: 1. Washout of right thigh abscess cavity 2. Negative pressure dressing placement  PRE-OPERATIVE DIAGNOSIS: ruptured mycotic right superficial femoral artery   POST-OPERATIVE DIAGNOSIS: same as above   SURGEON: Leonides Sake, MD  ANESTHESIA: general  ESTIMATED BLOOD LOSS: 50 cc  FINDING(S): 1.  Viable muscle 2.  Granuation evident in medial wound and superficial wound edges 3.  Intact superficial femoral artery stump and femoral vein  SPECIMEN(S):  none  INDICATIONS:   [name] is a 59 y.o. male who presents with ruptured mycotic right superficial femoral artery.  The patient returns to operating room today for washout of right abscess cavity and placement of new VAC dressing.  The patient will go back to the operating room on Friday with Plastic Surgery for attempt at muscle flap coverage of the femoral vein, which lays at the based of the wound.   DESCRIPTION: After obtaining full informed written consent, the patient was brought back to the operating room and placed supine upon the operating table.  The patient received IV antibiotics prior to induction.  After obtaining adequate anesthesia, the patient was prepped and draped in the standard fashion for: right thigh exploraiton.  I washed out the right thigh with 3 L of sterile normal saline with a Pulsavac.  The findings are listed above.  At this point, I fashioned a piece of white sponge to cover the femoral vein. I then fashioned a VAC dressing for this wound. I affixed adhesive strips to seal this cavity and then cut a hole in the middle for the lilypad. The lilypad was applied and then connected to the VAC pump. The pump was set to 125 continuous and the VAC dressing became adherent without any leaks.   COMPLICATIONS: none  CONDITION: stable   Leonides Sake, MD [facility] Office: [phone] Pager: [phone]  04/17/2015, 3:40 PM

## 2015-04-17 NOTE — Interval H&P Note (Signed)
History and Physical Interval Note:  04/17/2015 2:48 PM  [name]  has presented today for surgery, with the diagnosis of need for washout  The various methods of treatment have been discussed with the patient and family. After consideration of risks, benefits and other options for treatment, the patient has consented to  Procedure(s): IRRIGATION AND DEBRIDEMENT EXTREMITY (N/A) as a surgical intervention .  The patient's history has been reviewed, patient examined, no change in status, stable for surgery.  I have reviewed the patient's chart and labs.  Questions were answered to the patient's satisfaction.     Leonides Sake

## 2015-04-17 NOTE — Anesthesia Procedure Notes (Signed)
Procedure Name: LMA Insertion Date/Time: 04/17/2015 3:05 PM Performed by: Edmonia Caprio Pre-anesthesia Checklist: Patient identified, Timeout performed, Emergency Drugs available, Suction available and Patient being monitored Patient Re-evaluated:Patient Re-evaluated prior to inductionOxygen Delivery Method: Circle system utilized Preoxygenation: Pre-oxygenation with 100% oxygen Intubation Type: IV induction LMA: LMA inserted LMA Size: 5.0 Number of attempts: 1 Placement Confirmation: positive ETCO2 and breath sounds checked- equal and bilateral Tube secured with: Tape Dental Injury: Teeth and Oropharynx as per pre-operative assessment

## 2015-04-17 NOTE — Progress Notes (Addendum)
It was documented that Pt rec'd MS Contin this am at 0535, it was scheduled for 0600. Maybe Pt did not remember dose.

## 2015-04-17 NOTE — Progress Notes (Signed)
ANTICOAGULATION CONSULT NOTE - Follow Up Consult  Pharmacy Consult for Heparin to Argatroban Indication: Thromboembolism to Right Foot  Allergies  Allergen Reactions  . Imitrex [Sumatriptan] Other (See Comments)    Chest pain, 2004 (tolerates 50 mg prn)  . Verapamil Other (See Comments)    Causes pvc's    Patient Measurements: Height:  (190.5 cm) Weight: 228 lb (103.42 kg) IBW/kg (Calculated) : 84.5 Heparin dosing weight: 103 kg  Vital Signs: Temp: 98.5 F (36.9 C) (09/21 0852) Temp Source: Oral (09/21 0852) BP: 126/69 mmHg (09/21 0405) Pulse Rate: 102 (09/21 0405)  Labs:  Recent Labs  04/15/15 0413  04/15/15 1250 04/15/15 1922 04/16/15 0256 04/16/15 1149  04/16/15 2135 04/17/15 0415 04/17/15 0845  HGB 6.7*  --  8.2*  --  7.8*  --   --   --  7.2*  --   HCT 20.5*  --  25.5*  --  24.4*  --   --   --  22.7*  --   PLT 432*  --  484*  --  531*  --   --   --  555*  --   APTT  --   --   --   --   --  57*  < > 93* 104* 105*  HEPARINUNFRC  --   < > <0.10* <0.10* <0.10* <0.10*  --   --   --   --   CREATININE 1.20  --   --   --  1.27*  --   --   --   --   --   < > = values in this interval not displayed.  Estimated Creatinine Clearance: 83.6 mL/min (by C-G formula based on Cr of 1.27).   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 Pharmacy consulted for  heparin for right foot thromboembolism secondary to R groin aneurysm. He is noted for I&D 9/15, 9/19, and 9/21 and surgery on 9/23. Heparin levels remained undetectable on 3800 units/hr (hard max of 3999 units/hr), so Argatroban started per discussion with vascular surgery (do not want lovenox at this point due to impending surgeries). LFTs ok  APTT remains supratherapeutic (105) on 0.56 mcg/kg/min after dose decreases. Will decrease by 30%. No bleed/IV line issues per RN. Hg 7.2 (trend down), plt stable 555  Will  need to f/u long-term anticoag plan - lovenox may be better option post-surgeries.   Goal of Therapy:  PTT = 50 to 90 seconds Monitor platelets by anticoagulation protocol: Yes   Plan:   -Decrease argatroban by 30% to 0.39 mcg/kg/min -aPTT every 2 hours until two therapeutic levels are obtained. -CBC and aPTT at least once daily -Monitor for s/sx of bleeding -Repeat I&D 9/21; Plan for gracilis flap 9/23 -F/u long-term anticoag plan  Babs Bertin, PharmD Clinical Pharmacist Pager [phone] 04/17/2015 10:04 AM

## 2015-04-17 NOTE — Transfer of Care (Signed)
Immediate Anesthesia Transfer of Care Note  Patient: [name]  Procedure(s) Performed: Procedure(s): IRRIGATION AND DEBRIDEMENT LEFT EXTREMITY (Left)  Patient Location: PACU  Anesthesia Type:General  Level of Consciousness: awake and alert   Airway & Oxygen Therapy: Patient Spontanous Breathing and Patient connected to nasal cannula oxygen  Post-op Assessment: Report given to RN, Post -op Vital signs reviewed and stable and Patient moving all extremities  Post vital signs: Reviewed and stable  Last Vitals:  Filed Vitals:   04/17/15 1124  BP: 121/72  Pulse:   Temp: 37.1 C  Resp:     Complications: No apparent anesthesia complications

## 2015-04-18 ENCOUNTER — Inpatient Hospital Stay (HOSPITAL_COMMUNITY): Payer: Medicaid Other

## 2015-04-18 ENCOUNTER — Encounter (HOSPITAL_COMMUNITY): Payer: Self-pay | Admitting: Vascular Surgery

## 2015-04-18 DIAGNOSIS — M7989 Other specified soft tissue disorders: Secondary | ICD-10-CM

## 2015-04-18 DIAGNOSIS — I82401 Acute embolism and thrombosis of unspecified deep veins of right lower extremity: Secondary | ICD-10-CM

## 2015-04-18 DIAGNOSIS — I82409 Acute embolism and thrombosis of unspecified deep veins of unspecified lower extremity: Secondary | ICD-10-CM | POA: Diagnosis present

## 2015-04-18 DIAGNOSIS — I82403 Acute embolism and thrombosis of unspecified deep veins of lower extremity, bilateral: Secondary | ICD-10-CM

## 2015-04-18 DIAGNOSIS — I724 Aneurysm of artery of lower extremity: Secondary | ICD-10-CM | POA: Diagnosis present

## 2015-04-18 LAB — CBC
HCT: 23.7 % — ABNORMAL LOW (ref 39.0–52.0)
HEMOGLOBIN: 7.4 g/dL — AB (ref 13.0–17.0)
MCH: 26.9 pg (ref 26.0–34.0)
MCHC: 31.2 g/dL (ref 30.0–36.0)
MCV: 86.2 fL (ref 78.0–100.0)
Platelets: 588 10*3/uL — ABNORMAL HIGH (ref 150–400)
RBC: 2.75 MIL/uL — AB (ref 4.22–5.81)
RDW: 17.2 % — ABNORMAL HIGH (ref 11.5–15.5)
WBC: 9 10*3/uL (ref 4.0–10.5)

## 2015-04-18 LAB — BASIC METABOLIC PANEL
ANION GAP: 5 (ref 5–15)
BUN: 10 mg/dL (ref 6–20)
CALCIUM: 7.5 mg/dL — AB (ref 8.9–10.3)
CO2: 28 mmol/L (ref 22–32)
Chloride: 104 mmol/L (ref 101–111)
Creatinine, Ser: 1.1 mg/dL (ref 0.61–1.24)
GFR calc Af Amer: >60 mL/min (ref >60)
Glucose, Bld: 124 mg/dL — ABNORMAL HIGH (ref 65–99)
POTASSIUM: 3.4 mmol/L — AB (ref 3.5–5.1)
SODIUM: 137 mmol/L (ref 135–145)

## 2015-04-18 LAB — PREPARE RBC (CROSSMATCH)

## 2015-04-18 LAB — APTT
APTT: 63 s — AB (ref 24–37)
APTT: 64 s — AB (ref 24–37)
APTT: 63 s — AB (ref 24–37)

## 2015-04-18 MED ORDER — WHITE PETROLATUM GEL
Status: AC
  Filled 2015-04-18: qty 1

## 2015-04-18 MED ORDER — SODIUM CHLORIDE 0.9 % IV SOLN
Freq: Once | INTRAVENOUS | Status: AC
  Administered 2015-04-18: via INTRAVENOUS

## 2015-04-18 MED ORDER — ARGATROBAN 50 MG/50ML IV SOLN
0.4700 ug/kg/min | INTRAVENOUS | Status: DC
  Administered 2015-04-19: 0.39 ug/kg/min via INTRAVENOUS
  Administered 2015-04-20 – 2015-04-22 (×5): 0.47 ug/kg/min via INTRAVENOUS
  Filled 2015-04-18 (×8): qty 50

## 2015-04-18 MED ORDER — MAGNESIUM CITRATE PO SOLN
1.0000 | Freq: Once | ORAL | Status: DC
  Filled 2015-04-18 (×2): qty 296

## 2015-04-18 NOTE — Progress Notes (Signed)
UR COMPLETED  

## 2015-04-18 NOTE — Anesthesia Postprocedure Evaluation (Signed)
  Anesthesia Post-op Note  Patient: [name]  Procedure(s) Performed: Procedure(s): IRRIGATION AND DEBRIDEMENT LEFT EXTREMITY (Left)  Patient Location: PACU  Anesthesia Type:General  Level of Consciousness: awake and alert   Airway and Oxygen Therapy: Patient Spontanous Breathing  Post-op Pain: none  Post-op Assessment: Post-op Vital signs reviewed LLE Motor Response: Purposeful movement LLE Sensation: Full sensation RLE Motor Response: Purposeful movement RLE Sensation: Pain      Post-op Vital Signs: Reviewed  Last Vitals:  Filed Vitals:   04/18/15 1133  BP:   Pulse:   Temp: 36.8 C  Resp:     Complications: No apparent anesthesia complications

## 2015-04-18 NOTE — Progress Notes (Addendum)
  S/p resection mycotic aneurysm right SFA S/p VAC change and wash out 04/17/15  Temp:  [98 F (36.7 C)-99.4 F (37.4 C)] 99.4 F (37.4 C) (09/22 0400) Pulse Rate:  [45-111] 100 (09/22 0400) Resp:  [10-27] 11 (09/22 0400) BP: (107-145)/(49-86) 145/83 mmHg (09/22 0400) SpO2:  [95 %-100 %] 99 % (09/22 0400)   Therapeutic on Argatroban Remains on bed rest  PE  VAC in place, scant drainage VAC No changes to foot significant Very edematous LE and thigh RLE no cellulitis  A/P Reviewed incisions, drains, plan for gracilis. Given edema leg, high risk wound healing problems including donor site. I do not anticipate skin will be able to be closed over  Muscle flap and will utilize VAC over muscle flap while in hospital.  Will T&S given Hb 7.4 and on anticoagulation.  Will need to hold Argatroban prior to surgery.  Brinda Thimmappa, MD MBA Plastic & Reconstructive Surgery [phone]   ADDENDUM: 04/18/2015 1726  Patient diagnosed with DVT of right femoral, gastrocnemius, posterior tibial, LS veins today. We reviewed that gracilis flap based on medial circumflex femoral a branch and venae comitantes. Given diffuse areas for venous thrombosis, it is possible the the venous outflow to this flap is compromised. Reviewed this can cause flap death if veins not functioning. Reviewed options to wait and see if anticoagulation will resolve this, though this may take significant time, his exposed femoral vein remains at risk bleeding during this time. Any local flap from right thigh would have similar risk of impaired venous outflow. Discussed rectus abdominus as alternative. This would keep drainage out of known areas of DVT. Reviewed abdominal donor site, incision, risks hernia or bulge long term. Risk of donor site wound healing problems may be better with abdominal donor site as soft tissue much less edematous. At minimum, would plan VAC change in OR tomorrow.  Following discussion, patient agrees to  proceed with rectus flap.  

## 2015-04-18 NOTE — Progress Notes (Signed)
VASCULAR LAB PRELIMINARY  PRELIMINARY  PRELIMINARY  PRELIMINARY  Bilateral lower extremity venous duplex  completed.    Preliminary report:  Right:  DVT noted in the FV, Pop v, Gastrocnemius v, PTV.  Superficial thrombosis noted in the LSV.  Unable to image CFV due to bandage/wound vac.  No Baker's cyst.  Left:  No evidence of DVT, superficial thrombosis, or Baker's cyst.  Cestone,Helene, RVT 04/18/2015, 10:11 AM

## 2015-04-18 NOTE — Progress Notes (Signed)
ANTICOAGULATION CONSULT NOTE - Follow Up Consult  Pharmacy Consult for Heparin to Argatroban Indication: Thromboembolism to Right Foot  Allergies  Allergen Reactions  . Imitrex [Sumatriptan] Other (See Comments)    Chest pain, 2004 (tolerates 50 mg prn)  . Verapamil Other (See Comments)    Causes pvc's    Patient Measurements: Height:  (190.5 cm) Weight: 228 lb (103.42 kg) IBW/kg (Calculated) : 84.5 Heparin dosing weight: 103 kg  Vital Signs: Temp: 99.9 F (37.7 C) (09/22 1700) Temp Source: Oral (09/22 1700) BP: 134/78 mmHg (09/22 1133) Pulse Rate: 97 (09/22 1133)  Labs:  Recent Labs  04/15/15 1922  04/16/15 0256 04/16/15 1149  04/17/15 0415  04/18/15 0425 04/18/15 1350 04/18/15 1730  HGB  --   < > 7.8*  --   --  7.2*  --  7.4*  --   --   HCT  --   --  24.4*  --   --  22.7*  --  23.7*  --   --   PLT  --   --  531*  --   --  555*  --  588*  --   --   APTT  --   --   --  57*  < > 104*  < > 63* 64* 63*  HEPARINUNFRC <0.10*  --  <0.10* <0.10*  --   --   --   --   --   --   CREATININE  --   --  1.27*  --   --   --   --  1.10  --   --   < > = values in this interval not displayed.  Estimated Creatinine Clearance: 96.5 mL/min (by C-G formula based on Cr of 1.1).   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 Pharmacy consulted for  heparin for right foot thromboembolism secondary to R groin aneurysm. He is noted for I&D 9/15, 9/19, and 9/21 and surgery on 9/23. Heparin levels remained undetectable on 3800 units/hr (hard max of 3999 units/hr), so Argatroban started on 04/15/14 per discussion with vascular surgery (do not want lovenox at this point due to impending surgeries). LFTs ok  The second q2hour aPTT on restart of Argatroban is  therapeutic = 63 seconds on 0.39 mcg/kg/min IV argatroban. No bleeding noted. Change to daily aPTT.  MD Plan return to OR :45  tomorrow AM.  Dr. Imogene Burn notes plan is to stop Argatroban 2 hours prior to surgery and start 1 hour after surgery (half life is 1 hour).    Will need to f/u long-term anticoag plan - lovenox may be better option post-surgeries.   Goal of Therapy:  PTT = 50 to 90 seconds Monitor platelets by anticoagulation protocol: Yes   Plan:   -Continue argatroban at 0.39 mcg/kg/min -CBC and aPTT at least once daily -Monitor for s/sx of bleeding -Gracilis flap 9/23 - argatroban will need to be turned off prior.  Dr. Imogene Burn notes plan is to stop Argatroban 2 hours prior to surgery and start 1 hour after surgery. Surgery scheduled at 10:45AM. -F/u long-term anticoag plan  Noah Delaine, RPh Clinical Pharmacist Pager: [phone]  04/18/2015 6:53 PM

## 2015-04-18 NOTE — Progress Notes (Signed)
[place] TEAM 1 - Stepdown/ICU TEAM Progress Note  [name]:[id number] DOB: May 02, 1956 DOA: 04/06/2015 PCP: [name], MD  Admit HPI / Brief Narrative: 59 y.o.WM PMHx PUD, HLD, history of MRSA abscesses, peripheral neuropathy, and chronic pain   Presented to [facility] with severe left hand pain. He was found to be septic with acute kidney injury and left hand cellulitis. After admission he was noted to have a dusky swollen right foot. The patient reported that approximately 2 weeks prior he started feeling poorly and eating less. 9/7 he could not feel his right foot and fell as a result. He landed on his left wrist, shoulder, and back side. Since then his left hand has been very painful and his wrist and arm had begun to swell. He has developed thick erythematous streaking on his left arm.    In the ER: Lactic acid was 4.16, INR is 2.26 (not on anticoag), PTT is 24.7, WBC is 31.3, mag 1.3, potassium 2.7, procalcitonin 9.72. BP was 80/68 He received 3L of IV bolus in the ER. 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 Reportedly the patient injected his groin with testosterone 3 weeks ago.  HPI/Subjective: 9/23 patient has been in surgery all day  Assessment/Plan: Severe sepsis - MSSA Bacteremia  -Multiple potential sourcesl: L hand cellulitis, UTI/pyelonephritis, R groin puncture/aneursym -9/15 S/P first debridement of right thigh/groin -Prior to debridement leukocytosis increased to 47 -Continue normal saline 125 ml/hr -ID; recommends TEE when stable  Right femoral artery aneurysm with lower extremity numbness, swelling and Purpura of right foot -Patient injected his right groin with testosterone 3 weeks ago  - Spoke with Dr.Brinda Thimmappa (plastic surgery) who explained the findings of the surgery for today.LUE of significant edema and ballotable erythematous mass over ulnar head. Ordered DVT study. Communicated concern for  abscess.  -9/23 Did not see patient today secondary to patient being in surgery all day.  Acute renal failure(Cr=0.9 at baseline)  - improving with aggressive hydration, but not back to baseline.. -Hold any nephrotoxic medications. -Continue hydration 0.45% saline at 30 ml/hr -9/23 Did not see patient today secondary to patient being in surgery all day.  Left hand radius fracture/ cellulitis w/Hx MRSA abscess -[name], hand surgeon is directing care this issue,  -Per Dr.Brinda Thimmappa (plastic surgery) [name] (hand surgery) This was notified about her concern with patient's left hand, will await his recommendations.  Possible pyelonephritis -Creatinine improving with urinalysis   Elevated INR with coagulopathy -Resolving   Hypokalemia -WNL  Hypomagnesemia -WNL   Chronic pain -been pain medications for 12 years - managed by Dr. [name] [facility] [address]  -Takes high dose scheduled klonopin. Have ordered chronic pain meds as he takes at home with holding parameters.  -Klonopin 0.5 TID PRN. -9/23 Did not see patient today secondary to patient being in surgery all day.   Code Status: FULL Family Communication: None present at time of exam Disposition Plan: Per surgery    Consultants: Hand Surgery Dr.Brian L Chen Vascular Surgery  Dr. [name] (ID) Dr.Brinda Thimmappa (plastic surgery)    Procedure/Significant Events: 9/15, 9/17, and 9/19 Right thigh abscess I&D, Excision of infected right superficial femoral artery aneurysm 9/23Thrombosed and partially divided right femoral vein;  Rectus abdominus muscle flap to right groin   Culture 9/10 blood right hand positive MSSA 9/11 blood right forearm positive MSSA 9/11 negative MRSA by PCR 9/11 urine negative 9/12 blood right AC NGTD  Antibiotics: Zosyn 9/10 > stopped 9/13 Vancomycin 9/10 > stopped 9/15 Ancef 9/14>>   Famciclovir PRN  9/11>>   DVT prophylaxis: SCD   Devices    LINES /  TUBES:      Continuous Infusions: . sodium chloride 30 mL/hr at 04/16/15 0727    Objective: VITAL SIGNS: Temp: 98.5 F (36.9 C) (09/22 0800) Temp Source: Oral (09/22 0800) BP: 133/76 mmHg (09/22 0800) Pulse Rate: 100 (09/22 0400) SPO2; FIO2:   Intake/Output Summary (Last 24 hours) at 04/18/15 0809 Last data filed at 04/18/15 0800  Gross per 24 hour  Intake 1701.72 ml  Output   5025 ml  Net -3323.28 ml     Exam: Attempted to see patient on 2 occasions in surgery both times.   Data Reviewed: Basic Metabolic Panel:  Recent Labs Lab 04/12/15 0350 04/13/15 1254 04/14/15 0250 04/15/15 0413 04/16/15 0256 04/18/15 0425  NA 134* 139  --  136 136 137  K 3.3* 3.3*  --  4.2 3.6 3.4*  CL 107 112*  --  108 106 104  CO2 19* 21*  --  GLUCOSE 131* 103*  --  105* 111* 124*  BUN 18 14  --  CREATININE 1.41* 1.34*  --  1.20 1.27* 1.10  CALCIUM 6.8* 7.0*  --  7.3* 7.3* 7.5*  MG 1.6*  --  1.8 1.7  --   --   PHOS  --   --  3.5 3.2  --   --    Liver Function Tests:  Recent Labs Lab 04/12/15 0350 04/13/15 1254 04/15/15 0413 04/16/15 0256  AST 22 27  --  37  ALT 12* 10*  --  11*  ALKPHOS 92 150*  --  274*  BILITOT 0.9 1.1  --  0.8  PROT 5.0* 5.3*  --  5.7*  ALBUMIN 1.1* 1.1* 1.0* 1.1*   No results for input(s): LIPASE, AMYLASE in the last 168 hours. No results for input(s): AMMONIA in the last 168 hours. CBC:  Recent Labs Lab 04/12/15 0350  04/15/15 0413 04/15/15 1250 04/16/15 0256 04/17/15 0415 04/18/15 0425  WBC 26.9*  < > 18.0* 18.1* 14.6* 10.1 9.0  NEUTROABS 25.0*  --   --   --   --   --   --   HGB 7.3*  < > 6.7* 8.2* 7.8* 7.2* 7.4*  HCT 21.3*  < > 20.5* 25.5* 24.4* 22.7* 23.7*  MCV 81.0  < > 84.7 83.9 84.4 85.0 86.2  PLT 277  < > 432* 484* 531* 555* 588*  < > = values in this interval not displayed. Cardiac Enzymes: No results for input(s): CKTOTAL, CKMB, CKMBINDEX, TROPONINI in the last 168 hours. BNP (last 3 results) No  results for input(s): BNP in the last 8760 hours.  ProBNP (last 3 results) No results for input(s): PROBNP in the last 8760 hours.  CBG: No results for input(s): GLUCAP in the last 168 hours.  Recent Results (from the past 240 hour(s))  Culture, blood (routine x 2)     Status: None   Collection Time: 04/08/15  6:21 PM  Result Value Ref Range Status   Specimen Description BLOOD RIGHT ANTECUBITAL  Final   Special Requests BOTTLES DRAWN AEROBIC ONLY 4CC  Final   Culture NO GROWTH 5 DAYS  Final   Report Status 04/13/2015 FINAL  Final  Culture, routine-abscess     Status: None   Collection Time: 04/11/15  1:54 PM  Result  Value Ref Range Status   Specimen Description ABSCESS RIGHT THIGH  Final   Special Requests PATIENT ON FOLLOWING ANCEF VANCOMYCIN  Final   Gram Stain   Final    FEW WBC PRESENT, PREDOMINANTLY PMN NO SQUAMOUS EPITHELIAL CELLS SEEN RARE GRAM POSITIVE COCCI IN PAIRS IN CLUSTERS Performed at [facility]    Culture   Final    FEW STAPHYLOCOCCUS AUREUS Note: RIFAMPIN AND GENTAMICIN SHOULD NOT BE USED AS SINGLE DRUGS FOR TREATMENT OF STAPH INFECTIONS. Performed at [facility]    Report Status 04/14/2015 FINAL  Final   Organism ID, Bacteria STAPHYLOCOCCUS AUREUS  Final      Susceptibility   Staphylococcus aureus - MIC*    CLINDAMYCIN <=0.25 SENSITIVE Sensitive     ERYTHROMYCIN <=0.25 SENSITIVE Sensitive     GENTAMICIN <=0.5 SENSITIVE Sensitive     LEVOFLOXACIN 0.25 SENSITIVE Sensitive     OXACILLIN 0.5 SENSITIVE Sensitive     RIFAMPIN <=0.5 SENSITIVE Sensitive     TRIMETH/SULFA <=10 SENSITIVE Sensitive     VANCOMYCIN 1 SENSITIVE Sensitive     TETRACYCLINE <=1 SENSITIVE Sensitive     MOXIFLOXACIN <=0.25 SENSITIVE Sensitive     * FEW STAPHYLOCOCCUS AUREUS     Studies:  Recent x-ray studies have been reviewed in detail by the Attending Physician  Scheduled Meds:  Scheduled Meds: .  ceFAZolin (ANCEF) IV  2 g Intravenous 3 times per day  .  docusate sodium  100 mg Oral BID  . feeding supplement (ENSURE ENLIVE)  237 mL Oral TID BM  . metoprolol tartrate  25 mg Oral BID  . morphine  90 mg Oral 2 times per day  . morphine   Intravenous 6 times per day  . naloxegol oxalate  25 mg Oral Daily  . sodium chloride  3 mL Intravenous Q12H  . Tdap  0.5 mL Intramuscular Once  . venlafaxine XR  37.5 mg Oral Q breakfast    Time spent on care of this patient: 40 mins   WOODS, Roselind Messier , MD  [facility] Office  [phone] Pager [phone]  On-Call/Text Page:      [email or web address]      password [email or web address]  If 7PM-7AM, please contact night-coverage [email or web address] Password [email or web address] 04/18/2015, 8:09 AM   LOS: 11 days   Care during the described time interval was provided by me .  I have reviewed this patient's available data, including medical history, events of note, physical examination, and all test results as part of my evaluation. I have personally reviewed and interpreted all radiology studies.   Carolyne Littles, MD [phone] Pager

## 2015-04-18 NOTE — Progress Notes (Addendum)
     OR plan for Craig Fellows, MD  Plan return to OR in the am.  Stop Argatroban 2 hours and start 1 hour after surgery half life is 1 hour.  Thank.  COLLINS, EMMA MAUREEN PA-C  [facility]  Subjective  - New DVT in right LE.  Continued edema.   Objective 133/76 100 98.5 F (36.9 C) (Oral) 11 99%  Intake/Output Summary (Last 24 hours) at 04/18/15 1131 Last data filed at 04/18/15 0800  Gross per 24 hour  Intake 1701.72 ml  Output   4125 ml  Net -2423.28 ml   Left groin wound vac in place Doppler DP/PT left foot Edema significant Great toe second toe tip and lateral heel with ischemic changes Lungs non labored breathing Heart RR sinus tachy    Bilateral lower extremity venous duplex completed.   Preliminary report: Right: DVT noted in the FV, Pop v, Gastrocnemius v, PTV. Superficial thrombosis noted in the LSV. Unable to image CFV due to bandage/wound vac. No Baker's cyst. Left: No evidence of DVT, superficial thrombosis, or Baker's cyst   Assessment/Planning: Right thigh mycotic aneurysm Multiple Plan I and D of right thigh abscess with wound vac placement  Argatroban currently, will need to f/u long-term anticoag plan - lovenox may be better option post-surgeries OR plan for Craig Fellows, MD  Plan return to OR in the am.  Stop Argatroban 2 hours and start 1 hour after surgery half life is 1 hour.  Thank.  Clinton Gallant [facility] 04/18/2015 11:31 AM --  Laboratory Lab Results:  Recent Labs  04/17/15 0415 04/18/15 0425  WBC 10.1 9.0  HGB 7.2* 7.4*  HCT 22.7* 23.7*  PLT 555* 588*   BMET  Recent Labs  04/16/15 0256 04/18/15 0425  NA 136 137  K 3.6 3.4*  CL 106 104  CO2 25 28  GLUCOSE 111* 124*  BUN 12 10  CREATININE 1.27* 1.10  CALCIUM 7.3* 7.5*    COAG Lab Results  Component Value Date   INR 1.44 04/12/2015   INR 1.39 04/09/2015   INR 1.61* 04/08/2015   No results found for:  PTT   Addendum  I have independently interviewed and examined the patient, and I agree with the physician assistant's findings.  RLE DVT is not unexpected.  Continue with Argatroban.  Discussed with Dr. Leta Baptist - will wash out the right groin and look at the wound Friday and hold on muscle flap for now.  Continue VAC dressing.  Would get [name] involved to evaluate the R foot.  If the subcutaneous tissue overlying the heel necroses, his R foot might not be salvageable.    Leonides Sake, MD [facility] Office: [phone] Pager: [phone]  04/18/2015, 3:23 PM

## 2015-04-18 NOTE — Progress Notes (Addendum)
ANTICOAGULATION CONSULT NOTE - Follow Up Consult  Pharmacy Consult for Heparin to Argatroban Indication: Thromboembolism to Right Foot  Allergies  Allergen Reactions  . Imitrex [Sumatriptan] Other (See Comments)    Chest pain, 2004 (tolerates 50 mg prn)  . Verapamil Other (See Comments)    Causes pvc's    Patient Measurements: Height:  (190.5 cm) Weight: 228 lb (103.42 kg) IBW/kg (Calculated) : 84.5 Heparin dosing weight: 103 kg  Vital Signs: Temp: 98.5 F (36.9 C) (09/22 0800) Temp Source: Oral (09/22 0800) BP: 133/76 mmHg (09/22 0800) Pulse Rate: 100 (09/22 0400)  Labs:  Recent Labs  04/15/15 1922 04/16/15 0256 04/16/15 1149  04/17/15 0415 04/17/15 0845 04/18/15 0425  HGB  --  7.8*  --   --  7.2*  --  7.4*  HCT  --  24.4*  --   --  22.7*  --  23.7*  PLT  --  531*  --   --  555*  --  588*  APTT  --   --  57*  < > 104* 105* 63*  HEPARINUNFRC <0.10* <0.10* <0.10*  --   --   --   --   CREATININE  --  1.27*  --   --   --   --  1.10  < > = values in this interval not displayed.  Estimated Creatinine Clearance: 96.5 mL/min (by C-G formula based on Cr of 1.1).   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 Pharmacy consulted for  heparin for right foot thromboembolism secondary to R groin aneurysm. He is noted for I&D 9/15, 9/19, and 9/21 and surgery on 9/23. Heparin levels remained undetectable on 3800 units/hr (hard max of 3999 units/hr), so Argatroban started per discussion with vascular surgery (do not want lovenox at this point due to impending surgeries). LFTs ok  Confusion yesterday post-I&D and argatroban was not restarted. On-call Vasc Surgery MD elected to wait and let Dr. Imogene Burn make the decision in the AM, and argatroban was turned back on ~0900 today at same rate 0.53mcg/kg/min (goal aPTT 50-90). APTT 63 this AM off argatroban  1st aPTT on restart  therapeutic 64 on 0.39 mcg/kg/min. No bleeding/IV line issues per RN. Check 2h aPTT to confirm  Will need to f/u long-term anticoag plan - lovenox may be better option post-surgeries.   Goal of Therapy:  PTT = 50 to 90 seconds Monitor platelets by anticoagulation protocol: Yes   Plan:   -Continue argatroban at 0.39 mcg/kg/min -aPTT every 2 hours until two therapeutic levels are obtained. -CBC and aPTT at least once daily -Monitor for s/sx of bleeding -Gracilis flap 9/23 - argatroban will need to be turned off prior -F/u long-term anticoag plan  Babs Bertin, PharmD Clinical Pharmacist Pager [phone] 04/18/2015 9:53 AM

## 2015-04-18 NOTE — Progress Notes (Addendum)
INFECTIOUS DISEASE PROGRESS NOTE  ID: [name] is a 59 y.o. male with  Principal Problem:   Severe sepsis Active Problems:   Chronic pain syndrome   History of duodenal ulcer   ARF (acute renal failure)   Cellulitis of left hand   Elevated INR   Foot swelling   Elevated bilirubin   Hypokalemia   Hypomagnesemia   Femoral artery aneurysm, right   UTI (lower urinary tract infection)   Bacteremia   Bacteremia due to Staphylococcus aureus   Acute renal failure syndrome   Fracture of left radius   Cellulitis   Arm pain, anterior   Radius distal fracture   Cellulitis of finger of left hand   Acute pyelonephritis  Subjective: Without complaints  Abtx:  Anti-infectives    Start     Dose/Rate Route Frequency Ordered Stop   04/12/15 1130  vancomycin (VANCOCIN) 1,250 mg in sodium chloride 0.9 % 250 mL IVPB  Status:  Discontinued     1,250 mg 166.7 mL/hr over 90 Minutes Intravenous Every 12 hours 04/12/15 1101 04/12/15 1203   04/11/15 1415  polymyxin B 500,000 Units, bacitracin 50,000 Units in sodium chloride irrigation 0.9 % 500 mL irrigation  Status:  Discontinued       As needed 04/11/15 1416 04/11/15 1522   04/10/15 2200  ceFAZolin (ANCEF) IVPB 2 g/50 mL premix     2 g 100 mL/hr over 30 Minutes Intravenous 3 times per day 04/10/15 1756     04/08/15 0900  vancomycin (VANCOCIN) 1,250 mg in sodium chloride 0.9 % 250 mL IVPB  Status:  Discontinued     1,250 mg 166.7 mL/hr over 90 Minutes Intravenous Every 24 hours 04/07/15 0835 04/12/15 1101   04/07/15 1100  piperacillin-tazobactam (ZOSYN) IVPB 3.375 g  Status:  Discontinued     3.375 g 12.5 mL/hr over 240 Minutes Intravenous 3 times per day 04/07/15 0834 04/09/15 1042   04/07/15 0845  vancomycin (VANCOCIN) 1,250 mg in sodium chloride 0.9 % 250 mL IVPB     1,250 mg 166.7 mL/hr over 90 Minutes Intravenous  Once 04/07/15 0834 04/07/15 1101   04/07/15 0730  piperacillin-tazobactam (ZOSYN) IVPB 3.375 g  Status:  Discontinued      3.375 g 100 mL/hr over 30 Minutes Intravenous  Once 04/07/15 0723 04/07/15 0727   04/07/15 0730  vancomycin (VANCOCIN) IVPB 1000 mg/200 mL premix  Status:  Discontinued     1,000 mg 200 mL/hr over 60 Minutes Intravenous  Once 04/07/15 0723 04/07/15 0727   04/07/15 0724  famciclovir (FAMVIR) tablet 500 mg     500 mg Oral 2 times daily PRN 04/07/15 0729     04/07/15 0215  vancomycin (VANCOCIN) IVPB 1000 mg/200 mL premix     1,000 mg 200 mL/hr over 60 Minutes Intravenous  Once 04/07/15 0212 04/07/15 0311   04/07/15 0215  piperacillin-tazobactam (ZOSYN) IVPB 3.375 g     3.375 g 100 mL/hr over 30 Minutes Intravenous  Once 04/07/15 1610 04/07/15 0503      Medications:  Scheduled: .  ceFAZolin (ANCEF) IV  2 g Intravenous 3 times per day  . docusate sodium  100 mg Oral BID  . feeding supplement (ENSURE ENLIVE)  237 mL Oral TID BM  . metoprolol tartrate  25 mg Oral BID  . morphine  90 mg Oral 2 times per day  . morphine   Intravenous 6 times per day  . naloxegol oxalate  25 mg Oral Daily  . sodium chloride  3 mL Intravenous Q12H  . Tdap  0.5 mL Intramuscular Once  . venlafaxine XR  37.5 mg Oral Q breakfast    Objective: Vital signs in last 24 hours: Temp:  [98 F (36.7 C)-99.4 F (37.4 C)] 98.2 F (36.8 C) (09/22 1133) Pulse Rate:  [45-111] 100 (09/22 0400) Resp:  [10-27] 11 (09/22 0400) BP: (107-145)/(49-86) 133/76 mmHg (09/22 0800) SpO2:  [95 %-100 %] 99 % (09/22 0400)   General appearance: alert, cooperative and no distress Resp: clear to auscultation bilaterally Cardio: regular rate and rhythm GI: normal findings: bowel sounds normal and soft, non-tender Extremities: R foot denuding, blistering.  Incision/Wound: vac in place in R groin. Clean.    Lab Results  Recent Labs  04/16/15 0256 04/17/15 0415 04/18/15 0425  WBC 14.6* 10.1 9.0  HGB 7.8* 7.2* 7.4*  HCT 24.4* 22.7* 23.7*  NA 136  --  137  K 3.6  --  3.4*  CL 106  --  104  CO2 25  --  28  BUN 12  --   10  CREATININE 1.27*  --  1.10   Liver Panel  Recent Labs  04/16/15 0256  PROT 5.7*  ALBUMIN 1.1*  AST 37  ALT 11*  ALKPHOS 274*  BILITOT 0.8   Sedimentation Rate No results for input(s): ESRSEDRATE in the last 72 hours. C-Reactive Protein No results for input(s): CRP in the last 72 hours.  Microbiology: Recent Results (from the past 240 hour(s))  Culture, blood (routine x 2)     Status: None   Collection Time: 04/08/15  6:21 PM  Result Value Ref Range Status   Specimen Description BLOOD RIGHT ANTECUBITAL  Final   Special Requests BOTTLES DRAWN AEROBIC ONLY 4CC  Final   Culture NO GROWTH 5 DAYS  Final   Report Status 04/13/2015 FINAL  Final  Culture, routine-abscess     Status: None   Collection Time: 04/11/15  1:54 PM  Result Value Ref Range Status   Specimen Description ABSCESS RIGHT THIGH  Final   Special Requests PATIENT ON FOLLOWING ANCEF VANCOMYCIN  Final   Gram Stain   Final    FEW WBC PRESENT, PREDOMINANTLY PMN NO SQUAMOUS EPITHELIAL CELLS SEEN RARE GRAM POSITIVE COCCI IN PAIRS IN CLUSTERS Performed at [facility]    Culture   Final    FEW STAPHYLOCOCCUS AUREUS Note: RIFAMPIN AND GENTAMICIN SHOULD NOT BE USED AS SINGLE DRUGS FOR TREATMENT OF STAPH INFECTIONS. Performed at [facility]    Report Status 04/14/2015 FINAL  Final   Organism ID, Bacteria STAPHYLOCOCCUS AUREUS  Final      Susceptibility   Staphylococcus aureus - MIC*    CLINDAMYCIN <=0.25 SENSITIVE Sensitive     ERYTHROMYCIN <=0.25 SENSITIVE Sensitive     GENTAMICIN <=0.5 SENSITIVE Sensitive     LEVOFLOXACIN 0.25 SENSITIVE Sensitive     OXACILLIN 0.5 SENSITIVE Sensitive     RIFAMPIN <=0.5 SENSITIVE Sensitive     TRIMETH/SULFA <=10 SENSITIVE Sensitive     VANCOMYCIN 1 SENSITIVE Sensitive     TETRACYCLINE <=1 SENSITIVE Sensitive     MOXIFLOXACIN <=0.25 SENSITIVE Sensitive     * FEW STAPHYLOCOCCUS AUREUS    Studies/Results: No results  found.   Assessment/Plan: Sepsis Staph bacteremia- MSSA 2/2 1/2 BCx+ Bacillus  WBC normal. Would check TEE when able Repeat BCx 9-12 (-) Will continue ancef alone as his wound Cx 9-15 shows MSSA  Would aim for 6 weeks of IV ancef  Would repeat BCx 1 week  after IV anbx completed    Wrist Fracture Appreciate ortho f/u  RLE aneurysm and thrombus Debrided 9-17, 9-19, 9-21, return to OR 9-23   Will ask for wound care eval of his R foot as his blisters start to open.   Acute Renal failure Cr normalized  Therapeutic Drug monitoring off Vanco  Follow Cr- back to normal  Severe protein calorie malnutrition Nutrition f/u.   Total days of antibiotics: 9 vanco --> ancef  Available as needed.          [name] Infectious Diseases (pager) [phone] [email or web address] 04/18/2015, 12:55 PM  LOS: 11 days

## 2015-04-18 NOTE — Progress Notes (Signed)
[place] TEAM 1 - Stepdown/ICU TEAM Progress Note  NETTIE CROMWELL ZOX:[id number] DOB: 04-11-1956 DOA: 04/06/2015 PCP: [name], MD  Admit HPI / Brief Narrative: 59 y.o.WM PMHx PUD, HLD, history of MRSA abscesses, peripheral neuropathy, and chronic pain   Presented to [facility] with severe left hand pain. He was found to be septic with acute kidney injury and left hand cellulitis. After admission he was noted to have a dusky swollen right foot. The patient reported that approximately 2 weeks prior he started feeling poorly and eating less. 9/7 he could not feel his right foot and fell as a result. He landed on his left wrist, shoulder, and back side. Since then his left hand has been very painful and his wrist and arm had begun to swell. He has developed thick erythematous streaking on his left arm.    In the ER: Lactic acid was 4.16, INR is 2.26 (not on anticoag), PTT is 24.7, WBC is 31.3, mag 1.3, potassium 2.7, procalcitonin 9.72. BP was 80/68 He received 3L of IV bolus in the ER. 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 Reportedly the patient injected his groin with testosterone 3 weeks ago.  HPI/Subjective: 9/22 A/O 4, S/P right thigh debridement, with wound VAC present. Negative CP, negative SOB. Decreased thigh pain. Increase sensation loss RLE     Assessment/Plan: Severe sepsis - MSSA Bacteremia  -Multiple potential sourcesl: L hand cellulitis, UTI/pyelonephritis, R groin puncture/aneursym -S/P multiple debridement of right thigh/groin; see procedures below -Continue antibiotics per ID; will require Ancef 6 weeks -ID; recommends TEE when stable   Right femoral artery aneurysm with lower extremity numbness, swelling and Purpura of right foot -Patient injected his right groin with testosterone 3 weeks ago  - S/P debridement right thigh abscess see note below -Wound VAC in place, plastics consulted for eventual wound  closure -Further debridement/wound closure scheduled for Friday -Patient will be going to surgery in a.m. 9/22 transfuse 1 unit PRBC -Right first 3 metatarsals worsening secondary to DVT; patient may eventually lose these toes.  RLE DVT -Continue argatroban per surgery  Acute renal failure(Cr=0.9 at baseline)  - improving with aggressive hydration, but not back to baseline.. -Hold any nephrotoxic medications. -Continue hydration 0.45% saline at 30 ml/hr  Left hand radius fracture/ cellulitis w/Hx MRSA abscess -[name], hand surgeon is directing care this issue,   Possible pyelonephritis -Creatinine improving with urinalysis   Elevated INR with coagulopathy -Resolving   Hypokalemia -WNL  Hypomagnesemia -WNL   Chronic pain -been pain medications for 12 years - [name]  -Takes high dose scheduled klonopin. Have ordered chronic pain meds as he takes at home with holding parameters.  -Klonopin 0.5 TID PRN.    Code Status: FULL Family Communication: None present at time of exam Disposition Plan: Per surgery    Consultants: Hand Surgery Dr.Brian L Chen Vascular Surgery  Dr. [name] (ID)  Procedure/Significant Events: 9/15, 9/17, and 9/19 Right thigh abscess I&D, Excision of infected right superficial femoral artery aneurysm 9/22 bilateral LE Doppler;Right: DVT noted in the FV, Pop v, Gastrocnemius v, PTV. Superficial thrombosis noted in the LSV. Unable to image CFV due to bandage/wound vac. No Baker's cyst.  -Left: No evidence of DVT/SVT/ Baker's cyst. 9/22 transfuse 1 unit PRBC   Culture 9/10 blood right hand positive MSSA 9/11 blood right forearm positive MSSA 9/11 negative MRSA by PCR 9/11 urine negative 9/12 blood right AC  NGTD   Antibiotics: Zosyn 9/10 > stopped 9/13 Vancomycin 9/10 > stopped 9/15 Ancef 9/14>>   Famciclovir PRN  9/11>>   DVT prophylaxis: SCD   Devices    LINES / TUBES:       Continuous Infusions: . sodium chloride 30 mL/hr at 04/16/15 0727  . argatroban 0.39 mcg/kg/min (04/18/15 0921)    Objective: VITAL SIGNS: Temp: 99.9 F (37.7 C) (09/22 1700) Temp Source: Oral (09/22 1700) BP: 134/78 mmHg (09/22 1133) Pulse Rate: 97 (09/22 1133) SPO2; FIO2:   Intake/Output Summary (Last 24 hours) at 04/18/15 1934 Last data filed at 04/18/15 1700  Gross per 24 hour  Intake 2092.15 ml  Output   3425 ml  Net -1332.85 ml     Exam: General: A/O  4, No acute respiratory distress Eyes: Negative headache, negative scleral hemorrhage ENT: Negative Runny nose, negative ear pain, negative gingival bleeding, Neck:  Negative scars, masses, torticollis, lymphadenopathy, JVD Lungs: Clear to auscultation bilaterally without wheezes or crackles Cardiovascular: Regular rate and rhythm without murmur gallop or rub normal S1 and S2 Abdomen:negative abdominal pain, nondistended, positive soft, bowel sounds, no rebound, no ascites, no appreciable mass Extremities: right thigh/inguinal area swollen, erythematous, warm to touch, surgical area wound VAC in place. Right foot first and second and third metatarsal bluish in color, (increasing) , cool to touch, loss of soft touch sensation in first metatarsal, part of second metatarsal, remainder of foot and leg 3-4+ edema. painful to the touch. Psychiatric:  Negative depression, negative anxiety, negative fatigue, negative mania  Neurologic:  Cranial nerves II through XII intact, tongue/uvula midline, all extremities muscle strength 5/5, sensation intact throughout,  negative dysarthria, negative expressive aphasia, negative receptive aphasia.   Data Reviewed: Basic Metabolic Panel:  Recent Labs Lab 04/12/15 0350 04/13/15 1254 04/14/15 0250 04/15/15 0413 04/16/15 0256 04/18/15 0425  NA 134* 139  --  136 136 137  K 3.3* 3.3*  --  4.2 3.6 3.4*  CL 107 112*  --  108 106 104  CO2 19* 21*  --  GLUCOSE 131*  103*  --  105* 111* 124*  BUN 18 14  --  CREATININE 1.41* 1.34*  --  1.20 1.27* 1.10  CALCIUM 6.8* 7.0*  --  7.3* 7.3* 7.5*  MG 1.6*  --  1.8 1.7  --   --   PHOS  --   --  3.5 3.2  --   --    Liver Function Tests:  Recent Labs Lab 04/12/15 0350 04/13/15 1254 04/15/15 0413 04/16/15 0256  AST 22 27  --  37  ALT 12* 10*  --  11*  ALKPHOS 92 150*  --  274*  BILITOT 0.9 1.1  --  0.8  PROT 5.0* 5.3*  --  5.7*  ALBUMIN 1.1* 1.1* 1.0* 1.1*   No results for input(s): LIPASE, AMYLASE in the last 168 hours. No results for input(s): AMMONIA in the last 168 hours. CBC:  Recent Labs Lab 04/12/15 0350  04/15/15 0413 04/15/15 1250 04/16/15 0256 04/17/15 0415 04/18/15 0425  WBC 26.9*  < > 18.0* 18.1* 14.6* 10.1 9.0  NEUTROABS 25.0*  --   --   --   --   --   --   HGB 7.3*  < > 6.7* 8.2* 7.8* 7.2* 7.4*  HCT 21.3*  < > 20.5* 25.5* 24.4* 22.7* 23.7*  MCV 81.0  < > 84.7 83.9 84.4 85.0 86.2  PLT 277  < >  432* 484* 531* 555* 588*  < > = values in this interval not displayed. Cardiac Enzymes: No results for input(s): CKTOTAL, CKMB, CKMBINDEX, TROPONINI in the last 168 hours. BNP (last 3 results) No results for input(s): BNP in the last 8760 hours.  ProBNP (last 3 results) No results for input(s): PROBNP in the last 8760 hours.  CBG: No results for input(s): GLUCAP in the last 168 hours.  Recent Results (from the past 240 hour(s))  Culture, routine-abscess     Status: None   Collection Time: 04/11/15  1:54 PM  Result Value Ref Range Status   Specimen Description ABSCESS RIGHT THIGH  Final   Special Requests PATIENT ON FOLLOWING ANCEF VANCOMYCIN  Final   Gram Stain   Final    FEW WBC PRESENT, PREDOMINANTLY PMN NO SQUAMOUS EPITHELIAL CELLS SEEN RARE GRAM POSITIVE COCCI IN PAIRS IN CLUSTERS Performed at [facility]    Culture   Final    FEW STAPHYLOCOCCUS AUREUS Note: RIFAMPIN AND GENTAMICIN SHOULD NOT BE USED AS SINGLE DRUGS FOR TREATMENT OF STAPH  INFECTIONS. Performed at [facility]    Report Status 04/14/2015 FINAL  Final   Organism ID, Bacteria STAPHYLOCOCCUS AUREUS  Final      Susceptibility   Staphylococcus aureus - MIC*    CLINDAMYCIN <=0.25 SENSITIVE Sensitive     ERYTHROMYCIN <=0.25 SENSITIVE Sensitive     GENTAMICIN <=0.5 SENSITIVE Sensitive     LEVOFLOXACIN 0.25 SENSITIVE Sensitive     OXACILLIN 0.5 SENSITIVE Sensitive     RIFAMPIN <=0.5 SENSITIVE Sensitive     TRIMETH/SULFA <=10 SENSITIVE Sensitive     VANCOMYCIN 1 SENSITIVE Sensitive     TETRACYCLINE <=1 SENSITIVE Sensitive     MOXIFLOXACIN <=0.25 SENSITIVE Sensitive     * FEW STAPHYLOCOCCUS AUREUS     Studies:  Recent x-ray studies have been reviewed in detail by the Attending Physician  Scheduled Meds:  Scheduled Meds: . sodium chloride   Intravenous Once  .  ceFAZolin (ANCEF) IV  2 g Intravenous 3 times per day  . docusate sodium  100 mg Oral BID  . feeding supplement (ENSURE ENLIVE)  237 mL Oral TID BM  . magnesium citrate  1 Bottle Oral Once  . metoprolol tartrate  25 mg Oral BID  . morphine  90 mg Oral 2 times per day  . morphine   Intravenous 6 times per day  . naloxegol oxalate  25 mg Oral Daily  . sodium chloride  3 mL Intravenous Q12H  . Tdap  0.5 mL Intramuscular Once  . venlafaxine XR  37.5 mg Oral Q breakfast  . white petrolatum        Time spent on care of this patient: 40 mins   WOODS, Roselind Messier , MD  [facility] Office  [phone] Pager - [phone]  On-Call/Text Page:      [email or web address]      password [email or web address]  If 7PM-7AM, please contact night-coverage [email or web address] Password [facility] 04/18/2015, 7:34 PM   LOS: 11 days   Care during the described time interval was provided by me .  I have reviewed this patient's available data, including medical history, events of note, physical examination, and all test results as part of my evaluation. I have personally reviewed and interpreted all radiology studies.   Carolyne Littles, MD [phone] Pager

## 2015-04-19 ENCOUNTER — Inpatient Hospital Stay (HOSPITAL_COMMUNITY): Admission: RE | Admit: 2015-04-19 | Payer: 59 | Source: Ambulatory Visit | Admitting: Plastic Surgery

## 2015-04-19 ENCOUNTER — Encounter (HOSPITAL_COMMUNITY)
Admission: EM | Disposition: A | Discharge status: Rehabilitation Facility | Payer: Self-pay | Source: Home / Self Care | Attending: Internal Medicine

## 2015-04-19 ENCOUNTER — Inpatient Hospital Stay (HOSPITAL_COMMUNITY): Payer: Medicaid Other | Admitting: Anesthesiology

## 2015-04-19 HISTORY — PX: MUSCLE FLAP CLOSURE: SHX2054

## 2015-04-19 HISTORY — PX: FEMORAL REVISION: SHX5830

## 2015-04-19 LAB — COMPREHENSIVE METABOLIC PANEL
ALBUMIN: 1.3 g/dL — AB (ref 3.5–5.0)
ALK PHOS: 188 U/L — AB (ref 38–126)
ALT: 13 U/L — AB (ref 17–63)
AST: 36 U/L (ref 15–41)
Anion gap: 5 (ref 5–15)
BILIRUBIN TOTAL: 0.9 mg/dL (ref 0.3–1.2)
BUN: 7 mg/dL (ref 6–20)
CALCIUM: 7.6 mg/dL — AB (ref 8.9–10.3)
CO2: 29 mmol/L (ref 22–32)
CREATININE: 1.06 mg/dL (ref 0.61–1.24)
Chloride: 102 mmol/L (ref 101–111)
GFR calc Af Amer: >60 mL/min (ref >60)
GFR calc non Af Amer: >60 mL/min (ref >60)
GLUCOSE: 97 mg/dL (ref 65–99)
Potassium: 3.1 mmol/L — ABNORMAL LOW (ref 3.5–5.1)
Sodium: 136 mmol/L (ref 135–145)
Total Protein: 6 g/dL — ABNORMAL LOW (ref 6.5–8.1)

## 2015-04-19 LAB — CBC WITH DIFFERENTIAL/PLATELET
BASOS ABS: 0 10*3/uL (ref 0.0–0.1)
BASOS PCT: 0 %
EOS PCT: 1 %
Eosinophils Absolute: 0.1 10*3/uL (ref 0.0–0.7)
HCT: 25 % — ABNORMAL LOW (ref 39.0–52.0)
Hemoglobin: 8 g/dL — ABNORMAL LOW (ref 13.0–17.0)
Lymphocytes Relative: 13 %
Lymphs Abs: 1.2 10*3/uL (ref 0.7–4.0)
MCH: 28 pg (ref 26.0–34.0)
MCHC: 32 g/dL (ref 30.0–36.0)
MCV: 87.4 fL (ref 78.0–100.0)
MONO ABS: 1.1 10*3/uL — AB (ref 0.1–1.0)
MONOS PCT: 11 %
Neutro Abs: 7.1 10*3/uL (ref 1.7–7.7)
Neutrophils Relative %: 75 %
PLATELETS: 544 10*3/uL — AB (ref 150–400)
RBC: 2.86 MIL/uL — ABNORMAL LOW (ref 4.22–5.81)
RDW: 17.1 % — AB (ref 11.5–15.5)
WBC: 9.5 10*3/uL (ref 4.0–10.5)

## 2015-04-19 LAB — PREPARE RBC (CROSSMATCH)

## 2015-04-19 LAB — APTT
APTT: 53 s — AB (ref 24–37)
aPTT: 22 seconds — ABNORMAL LOW (ref 24–37)

## 2015-04-19 LAB — GLUCOSE, CAPILLARY: Glucose-Capillary: 87 mg/dL (ref 65–99)

## 2015-04-19 SURGERY — CLOSURE, WOUND, USING MUSCLE FLAP
Anesthesia: General | Site: Groin | Laterality: Right

## 2015-04-19 MED ORDER — DEXMEDETOMIDINE HCL 200 MCG/2ML IV SOLN
INTRAVENOUS | Status: DC | PRN
  Administered 2015-04-19: 48 ug via INTRAVENOUS

## 2015-04-19 MED ORDER — FENTANYL CITRATE (PF) 250 MCG/5ML IJ SOLN
INTRAMUSCULAR | Status: AC
  Filled 2015-04-19: qty 5

## 2015-04-19 MED ORDER — KETAMINE HCL 10 MG/ML IJ SOLN
INTRAMUSCULAR | Status: DC | PRN
  Administered 2015-04-19: 50 mg via INTRAVENOUS

## 2015-04-19 MED ORDER — KETAMINE HCL 100 MG/ML IJ SOLN
INTRAMUSCULAR | Status: AC
  Filled 2015-04-19: qty 1

## 2015-04-19 MED ORDER — NEOSTIGMINE METHYLSULFATE 10 MG/10ML IV SOLN
INTRAVENOUS | Status: DC | PRN
  Administered 2015-04-19: 5 mg via INTRAVENOUS

## 2015-04-19 MED ORDER — CEFAZOLIN SODIUM-DEXTROSE 2-3 GM-% IV SOLR
INTRAVENOUS | Status: AC
  Filled 2015-04-19: qty 50

## 2015-04-19 MED ORDER — ONDANSETRON HCL 4 MG/2ML IJ SOLN: 4.0000 mg | Freq: Once | INTRAMUSCULAR | Status: DC | PRN

## 2015-04-19 MED ORDER — STERILE WATER FOR INJECTION IJ SOLN
INTRAMUSCULAR | Status: AC
  Filled 2015-04-19: qty 10

## 2015-04-19 MED ORDER — GLYCOPYRROLATE 0.2 MG/ML IJ SOLN
INTRAMUSCULAR | Status: DC | PRN
  Administered 2015-04-19: 0.6 mg via INTRAVENOUS

## 2015-04-19 MED ORDER — ONDANSETRON HCL 4 MG/2ML IJ SOLN
INTRAMUSCULAR | Status: AC
  Filled 2015-04-19: qty 2

## 2015-04-19 MED ORDER — SODIUM CHLORIDE 0.9 % IR SOLN
Status: DC | PRN
  Administered 2015-04-19: 2000 mL

## 2015-04-19 MED ORDER — SODIUM CHLORIDE 0.9 % IV SOLN
Freq: Once | INTRAVENOUS | Status: DC
  Filled 2015-04-19: qty 1

## 2015-04-19 MED ORDER — PHENYLEPHRINE HCL 10 MG/ML IJ SOLN
10.0000 mg | INTRAMUSCULAR | Status: DC | PRN
  Administered 2015-04-19: 20 ug/min via INTRAVENOUS

## 2015-04-19 MED ORDER — VECURONIUM BROMIDE 10 MG IV SOLR
INTRAVENOUS | Status: DC | PRN
  Administered 2015-04-19: 2 mg via INTRAVENOUS
  Administered 2015-04-19: 3 mg via INTRAVENOUS
  Administered 2015-04-19: 1 mg via INTRAVENOUS
  Administered 2015-04-19: 5 mg via INTRAVENOUS
  Administered 2015-04-19: 1 mg via INTRAVENOUS
  Administered 2015-04-19 (×2): 3 mg via INTRAVENOUS

## 2015-04-19 MED ORDER — ALBUMIN HUMAN 5 % IV SOLN
INTRAVENOUS | Status: DC | PRN
  Administered 2015-04-19: 13:00:00 via INTRAVENOUS

## 2015-04-19 MED ORDER — LIDOCAINE HCL (CARDIAC) 20 MG/ML IV SOLN
INTRAVENOUS | Status: AC
Start: 2015-04-19 — End: 2015-04-19
  Filled 2015-04-19: qty 5

## 2015-04-19 MED ORDER — SUCCINYLCHOLINE 20MG/ML (10ML) SYRINGE FOR MEDFUSION PUMP - OPTIME
INTRAMUSCULAR | Status: DC | PRN
  Administered 2015-04-19: 120 mg via INTRAVENOUS

## 2015-04-19 MED ORDER — MIDAZOLAM HCL 2 MG/2ML IJ SOLN
INTRAMUSCULAR | Status: AC
  Filled 2015-04-19: qty 4

## 2015-04-19 MED ORDER — HYDROMORPHONE HCL 1 MG/ML IJ SOLN
INTRAMUSCULAR | Status: AC
  Filled 2015-04-19: qty 2

## 2015-04-19 MED ORDER — DEXMEDETOMIDINE HCL IN NACL 400 MCG/100ML IV SOLN
0.4000 ug/kg/h | INTRAVENOUS | Status: DC
Start: 2015-04-19 — End: 2015-04-19
  Administered 2015-04-19: .5 ug/kg/h via INTRAVENOUS
  Filled 2015-04-19: qty 100

## 2015-04-19 MED ORDER — DIPHENHYDRAMINE HCL 50 MG/ML IJ SOLN
INTRAMUSCULAR | Status: AC
  Filled 2015-04-19: qty 1

## 2015-04-19 MED ORDER — LIDOCAINE HCL (CARDIAC) 20 MG/ML IV SOLN
INTRAVENOUS | Status: DC | PRN
  Administered 2015-04-19: 50 mg via INTRAVENOUS

## 2015-04-19 MED ORDER — OXYCODONE HCL 5 MG PO TABS
5.0000 mg | ORAL_TABLET | Freq: Once | ORAL | Status: AC | PRN
  Administered 2015-04-19: 5 mg via ORAL

## 2015-04-19 MED ORDER — FENTANYL CITRATE (PF) 100 MCG/2ML IJ SOLN
INTRAMUSCULAR | Status: DC | PRN
  Administered 2015-04-19: 100 ug via INTRAVENOUS
  Administered 2015-04-19 (×4): 50 ug via INTRAVENOUS

## 2015-04-19 MED ORDER — VECURONIUM BROMIDE 10 MG IV SOLR
INTRAVENOUS | Status: AC
Start: 2015-04-19 — End: 2015-04-19
  Filled 2015-04-19: qty 10

## 2015-04-19 MED ORDER — FENTANYL CITRATE (PF) 100 MCG/2ML IJ SOLN
25.0000 ug | INTRAMUSCULAR | Status: DC | PRN
  Administered 2015-04-19 (×3): 50 ug via INTRAVENOUS

## 2015-04-19 MED ORDER — FENTANYL CITRATE (PF) 100 MCG/2ML IJ SOLN
INTRAMUSCULAR | Status: AC
  Filled 2015-04-19: qty 2

## 2015-04-19 MED ORDER — ROCURONIUM BROMIDE 50 MG/5ML IV SOLN
INTRAVENOUS | Status: AC
  Filled 2015-04-19: qty 1

## 2015-04-19 MED ORDER — LACTATED RINGERS IV SOLN
INTRAVENOUS | Status: DC | PRN
  Administered 2015-04-19 (×2): via INTRAVENOUS

## 2015-04-19 MED ORDER — OXYCODONE HCL 5 MG/5ML PO SOLN: 5.0000 mg | Freq: Once | ORAL | Status: AC | PRN

## 2015-04-19 MED ORDER — PROPOFOL 10 MG/ML IV BOLUS
INTRAVENOUS | Status: AC
  Filled 2015-04-19: qty 20

## 2015-04-19 MED ORDER — PROPOFOL 10 MG/ML IV BOLUS
INTRAVENOUS | Status: DC | PRN
  Administered 2015-04-19: 160 mg via INTRAVENOUS

## 2015-04-19 MED ORDER — LACTATED RINGERS IV SOLN
INTRAVENOUS | Status: DC
  Administered 2015-04-19: 11:00:00 via INTRAVENOUS

## 2015-04-19 MED ORDER — HYDROMORPHONE HCL 1 MG/ML IJ SOLN
0.2500 mg | INTRAMUSCULAR | Status: DC | PRN
  Administered 2015-04-19 (×4): 0.5 mg via INTRAVENOUS

## 2015-04-19 MED ORDER — OXYCODONE HCL 5 MG PO TABS
ORAL_TABLET | ORAL | Status: AC
  Filled 2015-04-19: qty 1

## 2015-04-19 SURGICAL SUPPLY — 88 items
ADH SKN CLS APL DERMABOND .7 (GAUZE/BANDAGES/DRESSINGS)
APPLIER CLIP 11 MED OPEN (CLIP) ×3
APPLIER CLIP 9.375 MED OPEN (MISCELLANEOUS) ×3
APR CLP MED 11 20 MLT OPN (CLIP) ×1
APR CLP MED 9.3 20 MLT OPN (MISCELLANEOUS) ×1
BAG DECANTER FOR FLEXI CONT (MISCELLANEOUS) ×3 IMPLANT
BINDER ABDOMINAL 12 ML 46-62 (SOFTGOODS) ×3 IMPLANT
BLADE 10 SAFETY STRL DISP (BLADE) ×3 IMPLANT
BLADE SURG 10 STRL SS (BLADE) ×3 IMPLANT
BLADE SURG ROTATE 9660 (MISCELLANEOUS) IMPLANT
CANISTER SUCTION 2500CC (MISCELLANEOUS) ×3 IMPLANT
CANISTER WOUND CARE 500ML ATS (WOUND CARE) ×2 IMPLANT
CHLORAPREP W/TINT 26ML (MISCELLANEOUS) ×1 IMPLANT
CLIP APPLIE 11 MED OPEN (CLIP) IMPLANT
CLIP APPLIE 9.375 MED OPEN (MISCELLANEOUS) ×1 IMPLANT
CLIP TI MEDIUM 24 (CLIP) ×2 IMPLANT
CLIP TI WIDE RED SMALL 24 (CLIP) ×2 IMPLANT
CORDS BIPOLAR (ELECTRODE) IMPLANT
COVER SURGICAL LIGHT HANDLE (MISCELLANEOUS) ×3 IMPLANT
DERMABOND ADVANCED (GAUZE/BANDAGES/DRESSINGS)
DERMABOND ADVANCED .7 DNX12 (GAUZE/BANDAGES/DRESSINGS) IMPLANT
DRAIN CHANNEL 15F RND FF W/TCR (WOUND CARE) ×4 IMPLANT
DRAPE ORTHO SPLIT 77X108 STRL (DRAPES) ×6
DRAPE PROXIMA HALF (DRAPES) ×6 IMPLANT
DRAPE SURG ORHT 6 SPLT 77X108 (DRAPES) ×2 IMPLANT
DRAPE WARM FLUID 44X44 (DRAPE) ×3 IMPLANT
DRSG EMULSION OIL 3X3 NADH (GAUZE/BANDAGES/DRESSINGS) ×3 IMPLANT
DRSG TELFA 3X8 NADH (GAUZE/BANDAGES/DRESSINGS) ×3 IMPLANT
DRSG VAC ATS SM SENSATRAC (GAUZE/BANDAGES/DRESSINGS) ×2 IMPLANT
ELECT BLADE 6.5 EXT (BLADE) ×2 IMPLANT
ELECT CAUTERY BLADE 6.4 (BLADE) ×3 IMPLANT
ELECT COATED BLADE 2.86 ST (ELECTRODE) ×3 IMPLANT
ELECT REM PT RETURN 9FT ADLT (ELECTROSURGICAL) ×3
ELECTRODE REM PT RTRN 9FT ADLT (ELECTROSURGICAL) ×1 IMPLANT
EVACUATOR SILICONE 100CC (DRAIN) ×4 IMPLANT
GAUZE SPONGE 4X4 12PLY STRL (GAUZE/BANDAGES/DRESSINGS) ×2 IMPLANT
GLOVE BIO SURGEON STRL SZ 6 (GLOVE) ×3 IMPLANT
GLOVE BIO SURGEON STRL SZ7 (GLOVE) ×4 IMPLANT
GLOVE BIO SURGEON STRL SZ7.5 (GLOVE) ×2 IMPLANT
GLOVE BIOGEL PI IND STRL 6.5 (GLOVE) IMPLANT
GLOVE BIOGEL PI IND STRL 7.0 (GLOVE) IMPLANT
GLOVE BIOGEL PI IND STRL 8 (GLOVE) IMPLANT
GLOVE BIOGEL PI INDICATOR 6.5 (GLOVE) ×2
GLOVE BIOGEL PI INDICATOR 7.0 (GLOVE) ×4
GLOVE BIOGEL PI INDICATOR 8 (GLOVE) ×2
GLOVE SURG SS PI 7.0 STRL IVOR (GLOVE) ×4 IMPLANT
GOWN STRL REUS W/ TWL LRG LVL3 (GOWN DISPOSABLE) ×2 IMPLANT
GOWN STRL REUS W/ TWL XL LVL3 (GOWN DISPOSABLE) IMPLANT
GOWN STRL REUS W/TWL LRG LVL3 (GOWN DISPOSABLE) ×9
GOWN STRL REUS W/TWL XL LVL3 (GOWN DISPOSABLE) ×6
KIT BASIN OR (CUSTOM PROCEDURE TRAY) ×3 IMPLANT
NS IRRIG 1000ML POUR BTL (IV SOLUTION) ×3 IMPLANT
PACK GENERAL/GYN (CUSTOM PROCEDURE TRAY) ×3 IMPLANT
PAD ABD 8X10 STRL (GAUZE/BANDAGES/DRESSINGS) ×3 IMPLANT
PAD ARMBOARD 7.5X6 YLW CONV (MISCELLANEOUS) ×6 IMPLANT
PAD DRESSING TELFA 3X8 NADH (GAUZE/BANDAGES/DRESSINGS) IMPLANT
PIN SAFETY STERILE (MISCELLANEOUS) ×2 IMPLANT
RELOAD PROXIMATE 75MM BLUE (ENDOMECHANICALS) ×3 IMPLANT
RELOAD STAPLE 75 3.8 BLU REG (ENDOMECHANICALS) IMPLANT
SCRUB BETADINE 4OZ XXX (MISCELLANEOUS) ×2 IMPLANT
SLEEVE SURGEON STRL (DRAPES) ×4 IMPLANT
SOLUTION BETADINE 4OZ (MISCELLANEOUS) ×2 IMPLANT
SPONGE GAUZE 4X4 12PLY STER LF (GAUZE/BANDAGES/DRESSINGS) ×2 IMPLANT
SPONGE LAP 18X18 X RAY DECT (DISPOSABLE) ×4 IMPLANT
STAPLER PROXIMATE 75MM BLUE (STAPLE) ×3 IMPLANT
STAPLER VISISTAT 35W (STAPLE) ×3 IMPLANT
SUT ETHILON 2 0 FS 18 (SUTURE) ×6 IMPLANT
SUT MNCRL AB 4-0 PS2 18 (SUTURE) IMPLANT
SUT MON AB 5-0 PS2 18 (SUTURE) IMPLANT
SUT PDS AB 0 CT 36 (SUTURE) ×6 IMPLANT
SUT PDS AB 2-0 CT1 27 (SUTURE) ×4 IMPLANT
SUT PDS AB 3-0 SH 27 (SUTURE) ×6 IMPLANT
SUT PLAIN 5 0 P 3 18 (SUTURE) IMPLANT
SUT PROLENE 5 0 C 1 24 (SUTURE) ×6 IMPLANT
SUT SILK 2 0 SH (SUTURE) ×2 IMPLANT
SUT VIC AB 2-0 SH 27 (SUTURE) ×3
SUT VIC AB 2-0 SH 27XBRD (SUTURE) IMPLANT
SUT VIC AB 3-0 PS2 18 (SUTURE) ×9
SUT VIC AB 3-0 PS2 18XBRD (SUTURE) IMPLANT
SUT VIC AB 3-0 SH 27 (SUTURE) ×6
SUT VIC AB 3-0 SH 27X BRD (SUTURE) IMPLANT
SUT VIC AB 4-0 PS2 27 (SUTURE) IMPLANT
SUT VLOC 180 0 24IN GS25 (SUTURE) IMPLANT
SYR BULB IRRIGATION 50ML (SYRINGE) ×3 IMPLANT
TAPE CLOTH SURG 6X10 WHT LF (GAUZE/BANDAGES/DRESSINGS) ×2 IMPLANT
TOWEL OR 17X24 6PK STRL BLUE (TOWEL DISPOSABLE) ×3 IMPLANT
TOWEL OR 17X26 10 PK STRL BLUE (TOWEL DISPOSABLE) ×3 IMPLANT
TRAY FOLEY CATH 14FRSI W/METER (CATHETERS) ×1 IMPLANT

## 2015-04-19 NOTE — H&P (View-Only) (Signed)
  S/p resection mycotic aneurysm right SFA S/p VAC change and wash out 04/17/15  Temp:  [98 F (36.7 C)-99.4 F (37.4 C)] 99.4 F (37.4 C) (09/22 0400) Pulse Rate:  [45-111] 100 (09/22 0400) Resp:  [10-27] 11 (09/22 0400) BP: (107-145)/(49-86) 145/83 mmHg (09/22 0400) SpO2:  [95 %-100 %] 99 % (09/22 0400)   Therapeutic on Argatroban Remains on bed rest  PE  VAC in place, scant drainage VAC No changes to foot significant Very edematous LE and thigh RLE no cellulitis  A/P Reviewed incisions, drains, plan for gracilis. Given edema leg, high risk wound healing problems including donor site. I do not anticipate skin will be able to be closed over  Muscle flap and will utilize VAC over muscle flap while in hospital.  Will T&S given Hb 7.4 and on anticoagulation.  Will need to hold Argatroban prior to surgery.  Glenna Fellows, MD [facility] Plastic & Reconstructive Surgery [phone]   ADDENDUM: 04/18/2015 1726  Patient diagnosed with DVT of right femoral, gastrocnemius, posterior tibial, LS veins today. We reviewed that gracilis flap based on medial circumflex femoral a branch and venae comitantes. Given diffuse areas for venous thrombosis, it is possible the the venous outflow to this flap is compromised. Reviewed this can cause flap death if veins not functioning. Reviewed options to wait and see if anticoagulation will resolve this, though this may take significant time, his exposed femoral vein remains at risk bleeding during this time. Any local flap from right thigh would have similar risk of impaired venous outflow. Discussed rectus abdominus as alternative. This would keep drainage out of known areas of DVT. Reviewed abdominal donor site, incision, risks hernia or bulge long term. Risk of donor site wound healing problems may be better with abdominal donor site as soft tissue much less edematous. At minimum, would plan VAC change in OR tomorrow.  Following discussion, patient agrees to  proceed with rectus flap.

## 2015-04-19 NOTE — Interval H&P Note (Signed)
History and Physical Interval Note:  04/19/2015 10:41 AM  [name]  has presented today for surgery, with the diagnosis of open wound right groin with complication history of right femoral aneurysn  The various methods of treatment have been discussed with the patient and family. After consideration of risks, benefits and other options for treatment, the patient has consented to  Procedure(s): RECTUS ABDOMINUS  FLAP TO RIGHT GROIN (Right) as a surgical intervention .  The patient's history has been reviewed, patient examined, no change in status, stable for surgery.  I have reviewed the patient's chart and labs.  Questions were answered to the patient's satisfaction.     [name]

## 2015-04-19 NOTE — Anesthesia Preprocedure Evaluation (Signed)
Anesthesia Evaluation  Patient identified by MRN, date of birth, ID band Patient awake    Reviewed: Allergy & Precautions, NPO status , Patient's Chart, lab work & pertinent test results  Airway Mallampati: II  TM Distance: >3 FB Neck ROM: Full    Dental  (+) Teeth Intact, Dental Advisory Given   Pulmonary former smoker,    breath sounds clear to auscultation       Cardiovascular hypertension,  Rhythm:Regular Rate:Normal     Neuro/Psych    GI/Hepatic   Endo/Other    Renal/GU      Musculoskeletal   Abdominal (+) + obese,   Peds  Hematology   Anesthesia Other Findings   Reproductive/Obstetrics                             Anesthesia Physical Anesthesia Plan  ASA: III  Anesthesia Plan: General   Post-op Pain Management:    Induction: Intravenous  Airway Management Planned: Oral ETT  Additional Equipment:   Intra-op Plan:   Post-operative Plan:   Informed Consent: I have reviewed the patients History and Physical, chart, labs and discussed the procedure including the risks, benefits and alternatives for the proposed anesthesia with the patient or authorized representative who has indicated his/her understanding and acceptance.   Dental advisory given  Plan Discussed with: CRNA and Anesthesiologist  Anesthesia Plan Comments:         Anesthesia Quick Evaluation

## 2015-04-19 NOTE — Progress Notes (Signed)
   Spoke with Dr. Joseph Art and [name] regarding findings of LUE of significant edema and ballotable erythematous mass over ulnar head. Ordered DVT study. Communicated concern for abscess.   Glenna Fellows, MD [facility] Plastic & Reconstructive Surgery [phone]

## 2015-04-19 NOTE — Op Note (Signed)
Operative Note   DATE OF OPERATION: 9.23.2016  LOCATION: [facility]- inpatient  SURGICAL DIVISION: Plastic Surgery  PREOPERATIVE DIAGNOSES:  1. History mycotic aneurysm right superficial femoral artery, s/p resection 2. Exposure femoral vessels right thigh  POSTOPERATIVE DIAGNOSES:  Same, 3. 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  PROCEDURE:  Rectus abdominus muscle flap to right groin  SURGEON: Glenna Fellows MD MBA  ASSISTANT: none  ANESTHESIA:  General.   EBL: 75 ml  COMPLICATIONS: None immediate.   INDICATIONS FOR PROCEDURE:  The patient, [name], is a 59 y.o. male born on 1956/01/14, is here for muscle flap coverage of exposed vascular structures right thigh and groin following debridement abscess, resection mycotic aneurysm. Patient has known DVTs of right leg.   FINDINGS: 1. Femoral vein on right thrombosed, partially divided and lumen exposed, no bleeding. 2. Ballotable mass with erythema of left wrist with significant edema LUE.  DESCRIPTION OF PROCEDURE:  The patient's operative site was marked with the patient in the preoperative area. The patient was taken to the operating room. SCDs were placed over left leg. Patient has been on scheduled antibiotics for treatment of MSSA bacteremia and abscess, he received his scheduled dose of IV antibiotics during the procedure. The patient's operative site was prepped and draped in a sterile fashion. A time out was performed and all information was confirmed to be correct. Upon examination of right thigh wound, base of wound demonstrated femoral vein which was inflamed and partially divided. Blood clot was removed and noted to be within lumen of femoral vein with no flow from either direction.   At this time, intraoperative Vascular Surgery consult obtained. Please see Dr. Edilia Bo note for details of oversewing proximal and distal femoral vein.   Right paramedian incision made and carried through subcutaneous  tissue and superficial fascia to anterior rectus sheath. Anterior rectus sheath incised. Rectus abdominus then freed from both anterior and posterior sheath at level of costal margin and muscle divided with GIA 75 stapler. Dissection then proceeded caudally; segmental nerves and blood vessels controlled with clips. Inferior epigastric pedicle identified and had palpable pulse.   Prior right groin incision opened to aid with passage of flap. Subcutaneous tunnel completed from abdominal incision to groin incision, and from groin to proximal thigh wound. Muscle rotated inferiorly into defect and secured to adjacent anterior thigh musculature with interrupted 2-0 PDS. Distal portion of right proximal thigh able to be approximated with 2-0 PDS in superficial fascia and skin approximated with staples. Remaining open wound over rectus muscle approximately 6 x 5  X 1 cm.  The right groin incision was closed over muscle flap with interrupted 3-0 vicryl in dermis, staples for skin closure  Anterior rectus fascia closed with 0-PDS suture with interrupted figure of eight sutures, leaving sheath open caudally extent. Wound irrigated and 15 Fr drain placed in subcutaneous position and secured to skin with 2-0 nylon suture. Interrupted 3-0 PDS used to approximate superficial fascia. 3-0 vicryl used in dermis followed by staples for skin closure.  Telfa and dry dressing applied to abdominal and right groin incision. Adaptic applied to proximal thigh wound followed by wound VAC sponge and set to 125 mm Hg continuous.  The patient was allowed to wake from anesthesia, extubated and taken to the recovery room in satisfactory condition.   SPECIMENS: none  DRAINS: 15 Fr JP subcutaneous abdomen, wound VAC to thigh wound  Glenna Fellows, MD [facility] [phone]

## 2015-04-19 NOTE — Progress Notes (Signed)
ANTICOAGULATION CONSULT NOTE - FOLLOW UP    aPTT = 53 (goal 50 - 90 sec) Heparin dosing weight = 103 kg   Assessment: 59 YOM with thromboembolism to the right foot to continue on argatroban.  APTT is therapeutic post rate adjustment this AM.  RN reported some blood through the wound VAC but not concerning   Plan: - Continue argatroban at 0.47 mcg/kg/min - Check repeat aPTT in 2 hours - F/U Doppler - Monitor for resolution of bleeding; RN aware to notify pharmacy should bleeding worsens    Thuy D. Laney Potash, PharmD, BCPS 04/19/2015, 9:43 PM

## 2015-04-19 NOTE — Transfer of Care (Signed)
Immediate Anesthesia Transfer of Care Note  Patient: [name]  Procedure(s) Performed: Procedure(s): RIGHT RECTUS ABDOMINUS  FLAP TO RIGHT GROIN (Right) OVERSEW RIGHT FEMORAL VEIN  (Right)  Patient Location: PACU  Anesthesia Type:General  Level of Consciousness: sedated and patient cooperative  Airway & Oxygen Therapy: Patient Spontanous Breathing and Patient connected to face mask oxygen  Post-op Assessment: Report given to RN and Post -op Vital signs reviewed and stable  Post vital signs: Reviewed  Last Vitals:  Filed Vitals:   04/19/15 1020  BP: 129/72  Pulse: 92  Temp:   Resp: 18    Complications: No apparent anesthesia complications

## 2015-04-19 NOTE — Progress Notes (Signed)
ANTICOAGULATION CONSULT NOTE - Follow Up Consult  Pharmacy Consult for Heparin to Argatroban Indication: Thromboembolism to Right Foot  Allergies  Allergen Reactions  . Imitrex [Sumatriptan] Other (See Comments)    Chest pain, 2004 (tolerates 50 mg prn)  . Verapamil Other (See Comments)    Causes pvc's    Patient Measurements: Height:  (190.5 cm) Weight: 228 lb (103.42 kg) IBW/kg (Calculated) : 84.5 Heparin dosing weight: 103 kg  Vital Signs: Temp: 98.7 F (37.1 C) (09/23 0800) Temp Source: Oral (09/23 0800) BP: 129/72 mmHg (09/23 1020) Pulse Rate: 92 (09/23 1020)  Labs:  Recent Labs  04/17/15 0415  04/18/15 0425 04/18/15 1350 04/18/15 1730 04/19/15 0405  HGB 7.2*  --  7.4*  --   --  8.0*  HCT 22.7*  --  23.7*  --   --  25.0*  PLT 555*  --  588*  --   --  544*  APTT 104*  < > 63* 64* 63* 22*  CREATININE  --   --  1.10  --   --  1.06  < > = values in this interval not displayed.  Estimated Creatinine Clearance: 100.2 mL/min (by C-G formula based on Cr of 1.06).   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 Pharmacy consulted for  Anticoagulation for right foot thromboembolism secondary to R groin aneurysm. He is s/p I&D 9/15, 9/19, and 9/21 and surgery on 9/23. Heparin levels remained undetectable on 3800 units/hr (hard max of 3999 units/hr), so Argatroban started on 04/15/14 per discussion with vascular surgery (do not want lovenox at this point due to impending surgeries). LFTs wnl  Patient was almost therapeutic on argatroban however this morning, his aptt fell significantly and was subtherapeutic. I verified with the RN that the infusion was still running and there were no problems with the line nor infusion.  Pt went to OR today for muscle flap placement and oversewing.  Argatroban was turned off for the procedure and will have to be increased  when it is resumed.    Goal of Therapy:  PTT = 50 to 90 seconds Monitor platelets by anticoagulation protocol: Yes    Plan:   -Increase argatroban to 0.47 mcg/kg/min, ~ 20% increase -CBC and aPTT at least once daily -Monitor for s/sx of bleeding -Restart argatroban 1 hour after patient returns from procedure -F/u long-term anticoag plan -Check aptt in 2 hours    Agapito Games, PharmD, BCPS Clinical Pharmacist Pager: [phone] 04/19/2015 2:25 PM

## 2015-04-19 NOTE — Anesthesia Procedure Notes (Signed)
Procedure Name: Intubation Date/Time: 04/19/2015 11:16 AM Performed by: Lovie Chol Pre-anesthesia Checklist: Patient identified, Emergency Drugs available, Suction available, Patient being monitored and Timeout performed Patient Re-evaluated:Patient Re-evaluated prior to inductionOxygen Delivery Method: Circle system utilized Preoxygenation: Pre-oxygenation with 100% oxygen Intubation Type: IV induction Ventilation: Mask ventilation without difficulty and Oral airway inserted - appropriate to patient size Laryngoscope Size: Mac and 4 Grade View: Grade II Tube type: Oral Tube size: 7.5 mm Number of attempts: 1 Placement Confirmation: ETT inserted through vocal cords under direct vision,  positive ETCO2,  CO2 detector and breath sounds checked- equal and bilateral Secured at: 22 cm Tube secured with: Tape Dental Injury: Teeth and Oropharynx as per pre-operative assessment

## 2015-04-19 NOTE — Anesthesia Postprocedure Evaluation (Signed)
  Anesthesia Post-op Note  Patient: [name]  Procedure(s) Performed: Procedure(s): RIGHT RECTUS ABDOMINUS  FLAP TO RIGHT GROIN (Right) OVERSEW RIGHT FEMORAL VEIN  (Right)  Patient Location: PACU  Anesthesia Type:General  Level of Consciousness: awake  Airway and Oxygen Therapy: Patient Spontanous Breathing  Post-op Pain: moderate  Post-op Assessment: Post-op Vital signs reviewed, Patient's Cardiovascular Status Stable, Respiratory Function Stable, Patent Airway, No signs of Nausea or vomiting and Pain level controlled LLE Motor Response: Purposeful movement LLE Sensation: Full sensation RLE Motor Response: Purposeful movement RLE Sensation: Pain      Post-op Vital Signs: Reviewed and stable  Last Vitals:  Filed Vitals:   04/19/15 1556  BP:   Pulse: 80  Temp:   Resp: 16    Complications: No apparent anesthesia complications

## 2015-04-19 NOTE — Op Note (Signed)
    NAME: [name]    MRN: [id number] DOB: Dec 01, 1955    DATE OF OPERATION: 04/19/2015  PREOP DIAGNOSIS: extensive right thigh wound with exposed femoral vein  POSTOP DIAGNOSIS: same  PROCEDURE: Oversewing of femoral vein proximally and distally.  SURGEON: Di Kindle. Edilia Bo, MD  ASSIST: Sherrilyn Rist, MD  ANESTHESIA: Gen.   EBL: none  INDICATIONS: [name] is a 59 y.o. male who is undergoing a rectus flap. At the base of her thigh wound she is noted to have exposed femoral vein which is partially divided. There was no active bleeding. I was asked to address the vein intraoperatively.  FINDINGS: no active bleeding from extensive wound to right thigh and partially divided right femoral vein related to necrotizing wound.  TECHNIQUE: The wound was explored and the femoral vein was partially divided with the lumen exposed. 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 There was no active bleeding in either end. I was able to then a 5 the lumen at both ends and after the segment which was exposed was excised the distal end was oversewn with a running 50 proline suture. This was done in 2 layers. Likewise, proximally the femoral vein was oversewn with a running 50 proline suture in 2 layers. The remainder of the dictation is dictated by Dr. Leta Baptist.   Waverly Ferrari, MD, FACS [facility]  DATE OF DICTATION:   04/19/2015

## 2015-04-20 ENCOUNTER — Inpatient Hospital Stay (HOSPITAL_COMMUNITY): Payer: Medicaid Other

## 2015-04-20 DIAGNOSIS — D62 Acute posthemorrhagic anemia: Secondary | ICD-10-CM | POA: Diagnosis present

## 2015-04-20 DIAGNOSIS — I272 Pulmonary hypertension, unspecified: Secondary | ICD-10-CM | POA: Diagnosis present

## 2015-04-20 DIAGNOSIS — L0291 Cutaneous abscess, unspecified: Secondary | ICD-10-CM

## 2015-04-20 DIAGNOSIS — R7881 Bacteremia: Secondary | ICD-10-CM | POA: Diagnosis present

## 2015-04-20 DIAGNOSIS — I270 Primary pulmonary hypertension: Secondary | ICD-10-CM

## 2015-04-20 LAB — COMPREHENSIVE METABOLIC PANEL
ALT: 11 U/L — AB (ref 17–63)
AST: 38 U/L (ref 15–41)
Albumin: 1.3 g/dL — ABNORMAL LOW (ref 3.5–5.0)
Alkaline Phosphatase: 171 U/L — ABNORMAL HIGH (ref 38–126)
Anion gap: 7 (ref 5–15)
BUN: 8 mg/dL (ref 6–20)
CHLORIDE: 101 mmol/L (ref 101–111)
CO2: 30 mmol/L (ref 22–32)
CREATININE: 1.11 mg/dL (ref 0.61–1.24)
Calcium: 7.6 mg/dL — ABNORMAL LOW (ref 8.9–10.3)
Glucose, Bld: 137 mg/dL — ABNORMAL HIGH (ref 65–99)
POTASSIUM: 3.3 mmol/L — AB (ref 3.5–5.1)
SODIUM: 138 mmol/L (ref 135–145)
Total Bilirubin: 0.6 mg/dL (ref 0.3–1.2)
Total Protein: 6.2 g/dL — ABNORMAL LOW (ref 6.5–8.1)

## 2015-04-20 LAB — CBC WITH DIFFERENTIAL/PLATELET
Basophils Absolute: 0 10*3/uL (ref 0.0–0.1)
Basophils Relative: 0 %
Eosinophils Absolute: 0 10*3/uL (ref 0.0–0.7)
Eosinophils Relative: 0 %
HEMATOCRIT: 23.9 % — AB (ref 39.0–52.0)
HEMOGLOBIN: 7.6 g/dL — AB (ref 13.0–17.0)
LYMPHS ABS: 1.1 10*3/uL (ref 0.7–4.0)
LYMPHS PCT: 11 %
MCH: 27.9 pg (ref 26.0–34.0)
MCHC: 31.8 g/dL (ref 30.0–36.0)
MCV: 87.9 fL (ref 78.0–100.0)
MONOS PCT: 11 %
Monocytes Absolute: 1.1 10*3/uL — ABNORMAL HIGH (ref 0.1–1.0)
NEUTROS PCT: 78 %
Neutro Abs: 7.7 10*3/uL (ref 1.7–7.7)
Platelets: 492 10*3/uL — ABNORMAL HIGH (ref 150–400)
RBC: 2.72 MIL/uL — AB (ref 4.22–5.81)
RDW: 17.3 % — ABNORMAL HIGH (ref 11.5–15.5)
WBC: 10 10*3/uL (ref 4.0–10.5)

## 2015-04-20 LAB — PREPARE RBC (CROSSMATCH)

## 2015-04-20 LAB — APTT
APTT: 58 s — AB (ref 24–37)
aPTT: 56 seconds — ABNORMAL HIGH (ref 24–37)

## 2015-04-20 MED ORDER — POTASSIUM CHLORIDE 10 MEQ/100ML IV SOLN
10.0000 meq | INTRAVENOUS | Status: AC
  Administered 2015-04-20 (×5): 10 meq via INTRAVENOUS
  Filled 2015-04-20: qty 100

## 2015-04-20 MED ORDER — LORAZEPAM 2 MG/ML IJ SOLN
1.0000 mg | Freq: Once | INTRAMUSCULAR | Status: AC
  Administered 2015-04-20: 1 mg via INTRAVENOUS
  Filled 2015-04-20: qty 1

## 2015-04-20 MED ORDER — SODIUM CHLORIDE 0.9 % IV SOLN
Freq: Once | INTRAVENOUS | Status: AC
  Administered 2015-04-20: 12:00:00 via INTRAVENOUS

## 2015-04-20 MED ORDER — VENLAFAXINE HCL ER 75 MG PO CP24
75.0000 mg | ORAL_CAPSULE | Freq: Every day | ORAL | Status: DC
  Administered 2015-04-21 – 2015-04-30 (×10): 75 mg via ORAL
  Filled 2015-04-20 (×12): qty 1

## 2015-04-20 MED ORDER — MORPHINE SULFATE (PF) 4 MG/ML IV SOLN
4.0000 mg | Freq: Once | INTRAVENOUS | Status: AC
Start: 2015-04-20 — End: 2015-04-20
  Administered 2015-04-20: 4 mg via INTRAVENOUS
  Filled 2015-04-20: qty 1

## 2015-04-20 MED ORDER — DEXTROSE-NACL 5-0.9 % IV SOLN
INTRAVENOUS | Status: DC
  Administered 2015-04-20: 75 mL/h via INTRAVENOUS
  Administered 2015-04-22 – 2015-04-25 (×2): via INTRAVENOUS

## 2015-04-20 NOTE — Progress Notes (Signed)
   Daily Progress Note  Assessment/Planning: POD #1 s/p Oversew ruptured right femoral vein, rectus abdominus flap to R groin; POD #9 resection of infected mycotic femoral aneurysm   Based on the intraoperatively findings, infection has digested the right femoral vein which was occluded already from the DVT.  Would still continue anticoagulation to try to maintain venous drainage as much as possible.  This patient will be at risk for venous gangrene given the femoral vein is gone.  He will continue to drain via his GSV proximally hopefully.  Ok to switch over to Lovenox from my viewpoint  Flap and right groin mgmt per Plastics  Would get [name] to look at the right foot on Monday to see if he thinks the foot can be salvaged.  I agree with Dr. Leta Baptist that the left arm needs to be re-examined by Ortho/Hand   Subjective  - 1 Day Post-Op  Pain controlled  Objective Filed Vitals:   04/20/15 0355 04/20/15 0400 04/20/15 0700 04/20/15 0755  BP: 126/64   126/66  Pulse: 107   102  Temp: 99.9 F (37.7 C)  100.3 F (37.9 C)   TempSrc: Oral  Oral   Resp: Height:      Weight:      SpO2: 99% 99%  93%    Intake/Output Summary (Last 24 hours) at 04/20/15 0957 Last data filed at 04/20/15 0900  Gross per 24 hour  Intake 2681.9 ml  Output   4385 ml  Net -1703.1 ml    PULM  CTAB CV  Tachycardiac, RR GI  soft, NTND VASC  R groin VAC adherent, no sanguinous drainage L wrist: erythematous with ballotable fluid  Laboratory CBC    Component Value Date/Time   WBC 10.0 04/20/2015 0459   HGB 7.6* 04/20/2015 0459   HCT 23.9* 04/20/2015 0459   PLT 492* 04/20/2015 0459    BMET    Component Value Date/Time   NA 138 04/20/2015 0459   K 3.3* 04/20/2015 0459   CL 101 04/20/2015 0459   CO2 30 04/20/2015 0459   GLUCOSE 137* 04/20/2015 0459   BUN 8 04/20/2015 0459   CREATININE 1.11 04/20/2015 0459   CALCIUM 7.6* 04/20/2015 0459   GFRNONAA >60 04/20/2015 0459   GFRAA >60 04/20/2015 0459    Leonides Sake, MD [facility] [place] Office: [phone] Pager: [phone]  04/20/2015, 9:57 AM

## 2015-04-20 NOTE — Progress Notes (Signed)
Pt. Noted with HR up to 172 SVT, but not sustained.  HR currently in the 100-110s. Pt stats he is anxious and   NP on call notified one time ativan ordered and told to call back if increased HR occurred again and sustained.  Will continue to monitor

## 2015-04-20 NOTE — Progress Notes (Signed)
VASCULAR LAB PRELIMINARY  PRELIMINARY  PRELIMINARY  PRELIMINARY  Left upper extremity venous duplex completed.    Preliminary report:  There is no DVT or SVT noted in the left upper extremity.   KANADY, CANDACE, RVT 04/20/2015, 4:40 PM

## 2015-04-20 NOTE — Progress Notes (Signed)
UR COMPLETED  

## 2015-04-20 NOTE — Progress Notes (Addendum)
ANTICOAGULATION CONSULT NOTE - FOLLOW UP  ADDENDUM: aPTT with AM labs remains therapeutic (58 sec) with argatroban dose of 0.47 mcg/kg/min. Will continue at current rate and monitor daily aPTT. Current Hgb 7.6, plt 492. Will follow for switch to enoxaparin per vascular surgery note. No reported worsening in bleeding per RN.   Sherle Poe, PharmD. PGY1 Resident Pager [phone] 04/20/2015, 10:44 AM _____________________________  aPTT = 56 (goal 50 - 90 sec) Heparin dosing weight = 103 kg   Assessment: 59 YOM with thromboembolism to the right foot to continue on argatroban.  APTT is therapeutic x2 post rate adjustment this AM.  RN reported some blood through the wound VAC but not concerning.   Plan: - Continue argatroban at 0.47 mcg/kg/min - Change aPTT to daily - Monitor for resolution of bleeding; RN aware to notify pharmacy should bleeding worsen  Christoper Fabian, PharmD, BCPS Clinical pharmacist, pager [phone] 04/20/2015, 12:50 AM

## 2015-04-20 NOTE — Progress Notes (Signed)
[place] TEAM 1 - Stepdown/ICU TEAM Progress Note  [name]:[id number] DOB: Jun 08, 1956 DOA: 04/06/2015 PCP: [name], MD  Admit HPI / Brief Narrative: 59 y.o.WM PMHx PUD, HLD, history of MRSA abscesses, peripheral neuropathy, and chronic pain   Presented to [facility] with severe left hand pain. He was found to be septic with acute kidney injury and left hand cellulitis. After admission he was noted to have a dusky swollen right foot. The patient reported that approximately 2 weeks prior he started feeling poorly and eating less. 9/7 he could not feel his right foot and fell as a result. He landed on his left wrist, shoulder, and back side. Since then his left hand has been very painful and his wrist and arm had begun to swell. He has developed thick erythematous streaking on his left arm.    In the ER: Lactic acid was 4.16, INR is 2.26 (not on anticoag), PTT is 24.7, WBC is 31.3, mag 1.3, potassium 2.7, procalcitonin 9.72. BP was 80/68 He received 3L of IV bolus in the ER. 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 Reportedly the patient injected his groin with testosterone 3 weeks ago.  HPI/Subjective: 9/24 A/O 4, S/P Rt thigh debridement, and Plastic surgery intervention with wound VAC present. Negative CP, negative SOB. Increased right thigh pain. Loss of sensation first 3 metatarsals RLE.     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  RLE DVT -Continue argatroban per surgery  Left hand radius fracture/ cellulitis w/Lt wrist abscess? -[name], hand surgeon is directing care this issue, spoke with [name] who is not on-call today. Then spoke with Dr.Ortman (hand surgeon), declined to see patient secondary to need not being emergent and having already established care with hand surgeon. -Left arm ultrasound nondiagnostic for abscess (preliminary read) obtain LUE CT scan.  Pulmonary hypertension/diastolic dysfunction -Transfuse for Hb<8 -9/22 transfuse 1 unit PRBC -9/24 transfuse 1 unit PRBC  Acute renal failure(Cr=0.9 at baseline) - improving with aggressive hydration, but not back to baseline.. -Hold any nephrotoxic medications. -D5-0.9% saline 75 ml/hr  Possible pyelonephritis -Creatinine improving with urinalysis   Elevated INR with coagulopathy -Resolving   Hypokalemia -Potassium goal > 4  -Potassium IV 50 mEq   Hypomagnesemia -WNL   Chronic pain -been pain medications for 12 years - managed by Dr. [name] [facility]?).  -Takes high dose scheduled klonopin. Have ordered chronic pain meds as he takes at home with holding parameters.  -Klonopin 0.5 TID PRN.    Code Status: FULL Family Communication: None present at time of exam Disposition Plan: Per surgery    Consultants: Dr. Knute Neu Hand Surgery Dr.Brian Rolena Infante [facility]  Dr. [name] (ID)  Procedure/Significant Events: 9/15, 9/17, and 9/19 Right thigh abscess I&D, Excision of infected right superficial femoral artery aneurysm 9/22 bilateral LE Doppler;Right: DVT noted in the FV, Pop v, Gastrocnemius  v, PTV. Superficial thrombosis noted in the LSV. Unable to image CFV due to bandage/wound vac. No Baker's cyst.  -Left: No evidence of DVT/SVT/ Baker's cyst. 9/19  echocardiogram;- LVEF=  55% to 60%.-(grade 1 diastolic dysfunction). - Pulmonary arteries: PApeak pressure: 41 mm Hg (S). 9/22 transfuse 1 unit PRBC 9/24 transfuse 1 unit PRBC   Culture 9/10 blood right hand positive MSSA 9/11 blood right forearm positive MSSA 9/11 negative MRSA by PCR 9/11 urine negative 9/12 blood right AC NGTD   Antibiotics: Zosyn 9/10 > stopped 9/13 Vancomycin 9/10 > stopped 9/15 Ancef 9/14>>   Famciclovir PRN  9/11>>   DVT prophylaxis: SCD   Devices    LINES / TUBES:      Continuous Infusions: . argatroban 0.47 mcg/kg/min (04/20/15 1230)  . dextrose 5 % and 0.9% NaCl 75 mL/hr (04/20/15 1737)    Objective: VITAL SIGNS: Temp: 100 F (37.8 C) (09/24 1546) Temp Source: Oral (09/24 1546) BP: 110/69 mmHg (09/24 1730) Pulse Rate: 102 (09/24 1730) SPO2; FIO2:   Intake/Output Summary (Last 24 hours) at 04/20/15 1902 Last data filed at 04/20/15 1700  Gross per 24 hour  Intake 1771.5 ml  Output   2260 ml  Net -488.5 ml     Exam: General: A/O  4, No acute respiratory distress, significant pain secondary to multiple surgeries and possible left wrist abscess Eyes: Negative headache, negative scleral hemorrhage ENT: Negative Runny nose, negative ear pain, negative gingival bleeding, Neck:  Negative scars, masses, torticollis, lymphadenopathy, JVD Lungs: Clear to auscultation bilaterally without wheezes or crackles Cardiovascular: Tachycardic, Regular rhythm without murmur gallop or rub normal S1 and S2 Abdomen:negative abdominal pain, nondistended, positive soft, bowel sounds, no rebound, no ascites, no appreciable mass Extremities: left forearm/wrist/hand increased swelling, warm to the touch, area on radial area, right thigh/inguinal area swollen, erythematous, warm to touch, surgical area wound VAC in place. Right foot first and second and third metatarsal bluish in color, (increasing) , cool to touch, loss of sensation in  first/second/third metatarsal, remainder of foot and leg 3-4+ edema. painful to the touch. Psychiatric:  Negative depression, negative anxiety, negative fatigue, negative mania  Neurologic:  Cranial nerves II through XII intact, tongue/uvula midline, all extremities muscle strength 5/5, sensation intact throughout,  negative dysarthria, negative expressive aphasia, negative receptive aphasia.   Data Reviewed: Basic Metabolic Panel:  Recent Labs Lab 04/14/15 0250 04/15/15 0413 04/16/15 0256 04/18/15 0425 04/19/15 0405 04/20/15 0459  NA  --  136 136 137 136 138  K  --  4.2 3.6 3.4* 3.1* 3.3*  CL  --  108 106 104 102 101  CO2  --  GLUCOSE  --  105* 111* 124* 97 137*  BUN  --  CREATININE  --  1.20 1.27* 1.10 1.06 1.11  CALCIUM  --  7.3* 7.3* 7.5* 7.6* 7.6*  MG 1.8 1.7  --   --   --   --   PHOS 3.5 3.2  --   --   --   --    Liver Function Tests:  Recent Labs Lab 04/15/15 0413 04/16/15 0256 04/19/15 0405 04/20/15 0459  AST  --  37 36 38  ALT  --  11* 13* 11*  ALKPHOS  --  274* 188* 171*  BILITOT  --  0.8 0.9 0.6  PROT  --  5.7* 6.0* 6.2*  ALBUMIN 1.0* 1.1* 1.3* 1.3*   No results for input(s): LIPASE, AMYLASE  in the last 168 hours. No results for input(s): AMMONIA in the last 168 hours. CBC:  Recent Labs Lab 04/16/15 0256 04/17/15 0415 04/18/15 0425 04/19/15 0405 04/20/15 0459  WBC 14.6* 10.1 9.0 9.5 10.0  NEUTROABS  --   --   --  7.1 7.7  HGB 7.8* 7.2* 7.4* 8.0* 7.6*  HCT 24.4* 22.7* 23.7* 25.0* 23.9*  MCV 84.4 85.0 86.2 87.4 87.9  PLT 531* 555* 588* 544* 492*   Cardiac Enzymes: No results for input(s): CKTOTAL, CKMB, CKMBINDEX, TROPONINI in the last 168 hours. BNP (last 3 results) No results for input(s): BNP in the last 8760 hours.  ProBNP (last 3 results) No results for input(s): PROBNP in the last 8760 hours.  CBG:  Recent Labs Lab 04/19/15 1530  GLUCAP 87    Recent Results (from the past 240 hour(s))  Culture,  routine-abscess     Status: None   Collection Time: 04/11/15  1:54 PM  Result Value Ref Range Status   Specimen Description ABSCESS RIGHT THIGH  Final   Special Requests PATIENT ON FOLLOWING ANCEF VANCOMYCIN  Final   Gram Stain   Final    FEW WBC PRESENT, PREDOMINANTLY PMN NO SQUAMOUS EPITHELIAL CELLS SEEN RARE GRAM POSITIVE COCCI IN PAIRS IN CLUSTERS Performed at [facility]    Culture   Final    FEW STAPHYLOCOCCUS AUREUS Note: RIFAMPIN AND GENTAMICIN SHOULD NOT BE USED AS SINGLE DRUGS FOR TREATMENT OF STAPH INFECTIONS. Performed at [facility]    Report Status 04/14/2015 FINAL  Final   Organism ID, Bacteria STAPHYLOCOCCUS AUREUS  Final      Susceptibility   Staphylococcus aureus - MIC*    CLINDAMYCIN <=0.25 SENSITIVE Sensitive     ERYTHROMYCIN <=0.25 SENSITIVE Sensitive     GENTAMICIN <=0.5 SENSITIVE Sensitive     LEVOFLOXACIN 0.25 SENSITIVE Sensitive     OXACILLIN 0.5 SENSITIVE Sensitive     RIFAMPIN <=0.5 SENSITIVE Sensitive     TRIMETH/SULFA <=10 SENSITIVE Sensitive     VANCOMYCIN 1 SENSITIVE Sensitive     TETRACYCLINE <=1 SENSITIVE Sensitive     MOXIFLOXACIN <=0.25 SENSITIVE Sensitive     * FEW STAPHYLOCOCCUS AUREUS     Studies:  Recent x-ray studies have been reviewed in detail by the Attending Physician  Scheduled Meds:  Scheduled Meds: .  ceFAZolin (ANCEF) IV  2 g Intravenous 3 times per day  . docusate sodium  100 mg Oral BID  . feeding supplement (ENSURE ENLIVE)  237 mL Oral TID BM  . metoprolol tartrate  25 mg Oral BID  . morphine  90 mg Oral 2 times per day  . morphine   Intravenous 6 times per day  . naloxegol oxalate  25 mg Oral Daily  . sodium chloride  3 mL Intravenous Q12H  . [START ON 04/21/2015] venlafaxine XR  75 mg Oral Q breakfast    Time spent on care of this patient: 40 mins   WOODS, Roselind Messier , MD  [facility] Office  [phone] Pager - [phone]  On-Call/Text Page:      [email or web address]      password  [email or web address]  If 7PM-7AM, please contact night-coverage [email or web address] Password [email or web address] 04/20/2015, 7:02 PM   LOS: 13 days   Care during the described time interval was provided by me .  I have reviewed this patient's available data, including medical history, events of note, physical examination, and all test results as part of my evaluation. I have personally reviewed and interpreted all radiology  studies.   Dia Crawford, MD [phone] Pager

## 2015-04-20 NOTE — Progress Notes (Signed)
POD# 1 rectus flap to right groin/thigh, over sewing femoral vein  Temp:  [99.9 F (37.7 C)-101 F (38.3 C)] 100.3 F (37.9 C) (09/24 0700) Pulse Rate:  [80-111] 102 (09/24 0755) Resp:  [13-20] 18 (09/24 0755) BP: (103-135)/(43-72) 126/66 mmHg (09/24 0755) SpO2:  [93 %-100 %] 93 % (09/24 0755)   JP 35 VAC 100  Therapeutic on Argatroban DVT study LUE pending   PE Abdomen dressing with one area dry drainage Right groin with edema, dry dressing RLE large edema including thigh with some erythema medial thigh,  VAC with watery serosanguinous drainage LUE with unchanged edema and ballotable area dorsal wrist  A/P May be OOB if ok with Vascular Surgery Reviewed intraop findings, over sewing of vessel and muscle flap in prior abscess cavity over stumps of vessels If no other concerns develop, likely VAC change at bedside on POD #4 Concern for LUE DVT- US ordered  Glenna Fellows, MD [facility] [phone]

## 2015-04-21 ENCOUNTER — Inpatient Hospital Stay (HOSPITAL_COMMUNITY): Payer: Medicaid Other

## 2015-04-21 ENCOUNTER — Encounter (HOSPITAL_COMMUNITY): Payer: Self-pay | Admitting: Radiology

## 2015-04-21 DIAGNOSIS — S52502A Unspecified fracture of the lower end of left radius, initial encounter for closed fracture: Secondary | ICD-10-CM | POA: Diagnosis present

## 2015-04-21 DIAGNOSIS — S5290XA Unspecified fracture of unspecified forearm, initial encounter for closed fracture: Secondary | ICD-10-CM

## 2015-04-21 LAB — CBC WITH DIFFERENTIAL/PLATELET
BASOS ABS: 0 10*3/uL (ref 0.0–0.1)
BASOS PCT: 0 %
EOS ABS: 0.1 10*3/uL (ref 0.0–0.7)
Eosinophils Relative: 1 %
HEMATOCRIT: 25.9 % — AB (ref 39.0–52.0)
HEMOGLOBIN: 8.1 g/dL — AB (ref 13.0–17.0)
Lymphocytes Relative: 11 %
Lymphs Abs: 1.1 10*3/uL (ref 0.7–4.0)
MCH: 27.4 pg (ref 26.0–34.0)
MCHC: 31.3 g/dL (ref 30.0–36.0)
MCV: 87.5 fL (ref 78.0–100.0)
Monocytes Absolute: 1.1 10*3/uL — ABNORMAL HIGH (ref 0.1–1.0)
Monocytes Relative: 11 %
NEUTROS ABS: 7.8 10*3/uL — AB (ref 1.7–7.7)
NEUTROS PCT: 77 %
Platelets: 434 10*3/uL — ABNORMAL HIGH (ref 150–400)
RBC: 2.96 MIL/uL — AB (ref 4.22–5.81)
RDW: 16.7 % — ABNORMAL HIGH (ref 11.5–15.5)
WBC: 10.1 10*3/uL (ref 4.0–10.5)

## 2015-04-21 LAB — COMPREHENSIVE METABOLIC PANEL
ALK PHOS: 132 U/L — AB (ref 38–126)
ALT: 10 U/L — AB (ref 17–63)
ANION GAP: 7 (ref 5–15)
AST: 33 U/L (ref 15–41)
Albumin: 1.4 g/dL — ABNORMAL LOW (ref 3.5–5.0)
BUN: 8 mg/dL (ref 6–20)
CALCIUM: 7.5 mg/dL — AB (ref 8.9–10.3)
CO2: 30 mmol/L (ref 22–32)
CREATININE: 1 mg/dL (ref 0.61–1.24)
Chloride: 99 mmol/L — ABNORMAL LOW (ref 101–111)
Glucose, Bld: 110 mg/dL — ABNORMAL HIGH (ref 65–99)
Potassium: 3.4 mmol/L — ABNORMAL LOW (ref 3.5–5.1)
SODIUM: 136 mmol/L (ref 135–145)
Total Bilirubin: 0.5 mg/dL (ref 0.3–1.2)
Total Protein: 6 g/dL — ABNORMAL LOW (ref 6.5–8.1)

## 2015-04-21 LAB — APTT: APTT: 77 s — AB (ref 24–37)

## 2015-04-21 LAB — MAGNESIUM: MAGNESIUM: 1.5 mg/dL — AB (ref 1.7–2.4)

## 2015-04-21 MED ORDER — IOHEXOL 300 MG/ML  SOLN: 100.0000 mL | Freq: Once | INTRAMUSCULAR | Status: DC | PRN

## 2015-04-21 MED ORDER — DEXTROSE 5 % IV SOLN
3.0000 g | Freq: Once | INTRAVENOUS | Status: AC
  Administered 2015-04-21: 3 g via INTRAVENOUS
  Filled 2015-04-21: qty 6

## 2015-04-21 NOTE — Progress Notes (Signed)
Radiology called this RN to inform me that in order to visualize an abscess, the CT would need to be with contrast. PA paged. New orders received. Will continue to monitor.

## 2015-04-21 NOTE — Progress Notes (Signed)
[place] TEAM 1 - Stepdown/ICU TEAM Progress Note  JADDEN YIM ZOX:[id number] DOB: 10-07-1955 DOA: 04/06/2015 PCP: [name], MD  Admit HPI / Brief Narrative: 59 y.o.WM PMHx PUD, HLD, history of MRSA abscesses, peripheral neuropathy, and chronic pain   Presented to [facility] [place] [facility] with severe left hand pain. He was found to be septic with acute kidney injury and left hand cellulitis. After admission he was noted to have a dusky swollen right foot. The patient reported that approximately 2 weeks prior he started feeling poorly and eating less. 9/7 he could not feel his right foot and fell as a result. He landed on his left wrist, shoulder, and back side. Since then his left hand has been very painful and his wrist and arm had begun to swell. He has developed thick erythematous streaking on his left arm.    In the ER: Lactic acid was 4.16, INR is 2.26 (not on anticoag), PTT is 24.7, WBC is 31.3, mag 1.3, potassium 2.7, procalcitonin 9.72. BP was 80/68 He received 3L of IV bolus in the ER. 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 Reportedly the patient injected his groin with testosterone 3 weeks ago.  HPI/Subjective: 9/25 afebrile overnight, A/O 4, S/P Rt thigh debridement, and Plastic surgery intervention with wound VAC present. Negative CP, negative SOB. Increased right thigh pain. Loss of sensation first 3 metatarsals RLE.     Assessment/Plan: Severe sepsis - MSSA Bacteremia  -Multiple potential sourcesl: Lt hand cellulitis, UTI/pyelonephritis, Rt groin puncture/aneursym -S/P multiple debridement of right thigh/groin; see procedures below -Continue antibiotics per ID; will require Ancef 6 weeks -ID; recommends TEE when stable; would revisit indication for this procedure after patient's multiple surgeries completed.   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 in the a.m. for treatment recommendations for foot/toes. Amputation?   RLE DVT -Continue argatroban per surgery  Left hand radius fracture/ cellulitis w/Lt wrist abscess? -[name], hand surgeon is directing care this issue, spoke with [name] who is not on-call today. Then spoke with Dr.Ortman (hand surgeon), declined to see patient secondary to need not being emergent and having already established care with hand surgeon. -Left arm ultrasound nondiagnostic for abscess (preliminary read); patient unable to be positioned properly for LUE CT scan. -Ensure we contact Dr. Knute Neu Hand Surgery. Not convinced abscess but will defer to [name]  Pulmonary hypertension/diastolic dysfunction -Transfuse for Hb<8 -9/22 transfuse 1 unit PRBC -9/24 transfuse 1 unit PRBC  Acute renal failure(Cr=0.9 at baseline) -improving with aggressive hydration, but not back to baseline.. -Hold any nephrotoxic medications. -D5-0.9% saline 75 ml/hr  Possible pyelonephritis -Creatinine improving with urinalysis   Elevated INR with coagulopathy -Resolving   Hypokalemia -Potassium goal > 4  -Potassium IV 60 mEq   Hypomagnesemia -Magnesium goal>2 - Magnesium IV 3 gm  Chronic pain -been pain medications for 12 years - managed by Dr. [name] [facility]  -Takes high dose scheduled klonopin. Have ordered chronic pain meds as he takes at home with holding parameters.  -Klonopin 0.5 TID PRN.    Code Status: FULL Family Communication: None present at time of exam Disposition Plan: Per  surgery    Consultants: Dr. Knute Neu Hand Surgery Dr.Brian Rolena Infante V[facility] Surgery  Dr. [name] (ID)   Procedure/Significant  Events: 9/15, 9/17, and 9/19 Right thigh abscess I&D, Excision of infected right superficial femoral artery aneurysm 9/22 bilateral LE Doppler;Right: DVT noted in the FV, Pop v, Gastrocnemius v, PTV. Superficial thrombosis noted in the LSV. Unable to image CFV due to bandage/wound vac. No Baker's cyst.  -Left: No evidence of DVT/SVT/ Baker's cyst. 9/19 echocardiogram;- LVEF=  55% to 60%.-(grade 1 diastolic dysfunction). - Pulmonary arteries: PApeak pressure: 41 mm Hg (S). 9/22 transfuse 1 unit PRBC 9/24 transfuse 1 unit PRBC 9/24 LUE venous Doppler: Negative for DVT/SVT   Culture 9/10 blood right hand positive MSSA 9/11 blood right forearm positive MSSA 9/11 negative MRSA by PCR 9/11 urine negative 9/12 blood right AC NGTD   Antibiotics: Zosyn 9/10 > stopped 9/13 Vancomycin 9/10 > stopped 9/15 Ancef 9/14>>   Famciclovir PRN  9/11>>   DVT prophylaxis: SCD   Devices    LINES / TUBES:      Continuous Infusions: . argatroban 0.47 mcg/kg/min (04/21/15 1614)  . dextrose 5 % and 0.9% NaCl 75 mL/hr at 04/21/15 1500    Objective: VITAL SIGNS: Temp: 98.8 F (37.1 C) (09/25 1510) Temp Source: Oral (09/25 1510) BP: 135/79 mmHg (09/25 1510) Pulse Rate: 96 (09/25 1510) SPO2; FIO2:   Intake/Output Summary (Last 24 hours) at 04/21/15 1631 Last data filed at 04/21/15 1515  Gross per 24 hour  Intake 2740.1 ml  Output   2740 ml  Net    0.1 ml     Exam: General: A/O  4, No acute respiratory distress, significant pain secondary to multiple surgeries and possible left wrist abscess Eyes: Negative headache, negative scleral hemorrhage ENT: Negative Runny nose, negative ear pain, negative gingival bleeding, Neck:  Negative scars, masses, torticollis, lymphadenopathy, JVD Lungs: Clear to auscultation bilaterally without wheezes or crackles Cardiovascular: Tachycardic, Regular rhythm without murmur gallop or rub normal S1 and S2 Abdomen:negative abdominal pain,  nondistended, positive soft, bowel sounds, no rebound, no ascites, no appreciable mass Extremities: left forearm/wrist/hand decreased swelling, negative warmth to the touch, area on radial area., right thigh/inguinal area swollen, erythematous, warm to touch, surgical area wound VAC in place. Right foot first and second and third metatarsal bluish in color, (increasing) , cool to touch, loss of sensation in first/second/third metatarsal, remainder of foot and leg 3-4+ edema. painful to the touch. Psychiatric:  Negative depression, negative anxiety, negative fatigue, negative mania  Neurologic:  Cranial nerves II through XII intact, tongue/uvula midline, all extremities muscle strength 5/5, sensation intact throughout,  negative dysarthria, negative expressive aphasia, negative receptive aphasia.   Data Reviewed: Basic Metabolic Panel:  Recent Labs Lab 04/15/15 0413 04/16/15 0256 04/18/15 0425 04/19/15 0405 04/20/15 0459 04/21/15 0520  NA 136 136 137 136 138 136  K 4.2 3.6 3.4* 3.1* 3.3* 3.4*  CL 108 106 104 102 101 99*  CO2 GLUCOSE 105* 111* 124* 97 137* 110*  BUN CREATININE 1.20 1.27* 1.10 1.06 1.11 1.00  CALCIUM 7.3* 7.3* 7.5* 7.6* 7.6* 7.5*  MG 1.7  --   --   --   --  1.5*  PHOS 3.2  --   --   --   --   --    Liver Function Tests:  Recent Labs Lab 04/15/15 0413 04/16/15 0256 04/19/15 0405 04/20/15 0459 04/21/15 0520  AST  --  37 36 38 33  ALT  --  11* 13* 11* 10*  ALKPHOS  --  274* 188* 171* 132*  BILITOT  --  0.8 0.9 0.6 0.5  PROT  --  5.7* 6.0* 6.2* 6.0*  ALBUMIN 1.0* 1.1* 1.3* 1.3* 1.4*   No results for input(s): LIPASE, AMYLASE in the last 168 hours. No results for input(s): AMMONIA in the last 168 hours. CBC:  Recent Labs Lab 04/17/15 0415 04/18/15 0425 04/19/15 0405 04/20/15 0459 04/21/15 0520  WBC 10.1 9.0 9.5 10.0 10.1  NEUTROABS  --   --  7.1 7.7 7.8*  HGB 7.2* 7.4* 8.0* 7.6* 8.1*  HCT 22.7* 23.7* 25.0* 23.9*  25.9*  MCV 85.0 86.2 87.4 87.9 87.5  PLT 555* 588* 544* 492* 434*   Cardiac Enzymes: No results for input(s): CKTOTAL, CKMB, CKMBINDEX, TROPONINI in the last 168 hours. BNP (last 3 results) No results for input(s): BNP in the last 8760 hours.  ProBNP (last 3 results) No results for input(s): PROBNP in the last 8760 hours.  CBG:  Recent Labs Lab 04/19/15 1530  GLUCAP 87    No results found for this or any previous visit (from the past 240 hour(s)).   Studies:  Recent x-ray studies have been reviewed in detail by the Attending Physician  Scheduled Meds:  Scheduled Meds: .  ceFAZolin (ANCEF) IV  2 g Intravenous 3 times per day  . docusate sodium  100 mg Oral BID  . feeding supplement (ENSURE ENLIVE)  237 mL Oral TID BM  . metoprolol tartrate  25 mg Oral BID  . morphine  90 mg Oral 2 times per day  . morphine   Intravenous 6 times per day  . naloxegol oxalate  25 mg Oral Daily  . sodium chloride  3 mL Intravenous Q12H  . venlafaxine XR  75 mg Oral Q breakfast    Time spent on care of this patient: 40 mins   WOODS, Roselind Messier , MD  [facility] Office  [phone] Pager [phone]  On-Call/Text Page:      [email or web address]      password [email or web address]  If 7PM-7AM, please contact night-coverage [email or web address] Password [email or web address] 04/21/2015, 4:31 PM   LOS: 14 days   Care during the described time interval was provided by me .  I have reviewed this patient's available data, including medical history, events of note, physical examination, and all test results as part of my evaluation. I have personally reviewed and interpreted all radiology studies.   Carolyne Littles, MD [phone] Pager

## 2015-04-21 NOTE — Progress Notes (Addendum)
CT called this RN to notify me that they are unable to complete CT because they are unable to position pt in machine.

## 2015-04-21 NOTE — Progress Notes (Signed)
ANTICOAGULATION CONSULT NOTE - FOLLOW UP  aPTT = 56 (goal 50 - 90 sec) Heparin dosing weight = 103 kg   Assessment: 59 YOM with thromboembolism to the right foot to continue on argatroban.  APTT is therapeutic x2 post rate adjustment this AM.  RN reported some blood through the wound VAC but not concerning. aPTT therapeutic x3 with current level at 77 sec on current rate. Hgb 8.1, small decrease in plt today down to 110. No reported bleeding. Cleared to switch to lovenox per vascular surgery note.  Plan: Continue argatroban at 0.47 mcg/kg/min Daily aPTT, CBC daily Monitor for s/sx bleeding, change in anticoag plan   Sherle Poe, PharmD PGY1 resident Pager [phone] 04/21/2015, 1:03 PM

## 2015-04-21 NOTE — Progress Notes (Signed)
   Daily Progress Note  Assessment/Planning: POD #2 s/p L FV ligation due to rupture, L rectus flap to R groin   Wound check tomorrow with Plastics  Left heel looks better, toes unchanged: Consult [name] tomorrow  Fever curve better   Subjective  - 2 Days Post-Op  R foot pain tolerable  Objective Filed Vitals:   04/21/15 0442 04/21/15 0502 04/21/15 0800 04/21/15 0804  BP: 126/80   121/80  Pulse:    98  Temp: 99.1 F (37.3 C)  98.7 F (37.1 C)   TempSrc: Oral  Oral   Resp:  Height:      Weight:      SpO2:  99% 97% 98%    Intake/Output Summary (Last 24 hours) at 04/21/15 0846 Last data filed at 04/21/15 0800  Gross per 24 hour  Intake 2317.3 ml  Output   2585 ml  Net -267.7 ml    VASC  R groin VAC adherent, R foot: heel blistered drained, no ischemic conversion, toes unchanged with obvious thromboembolic skin changes  Laboratory CBC    Component Value Date/Time   WBC 10.1 04/21/2015 0520   HGB 8.1* 04/21/2015 0520   HCT 25.9* 04/21/2015 0520   PLT 434* 04/21/2015 0520    BMET    Component Value Date/Time   NA 136 04/21/2015 0520   K 3.4* 04/21/2015 0520   CL 99* 04/21/2015 0520   CO2 30 04/21/2015 0520   GLUCOSE 110* 04/21/2015 0520   BUN 8 04/21/2015 0520   CREATININE 1.00 04/21/2015 0520   CALCIUM 7.5* 04/21/2015 0520   GFRNONAA >60 04/21/2015 0520   GFRAA >60 04/21/2015 1610    Leonides Sake, MD [facility] [place] Office: [phone] Pager: [phone]  04/21/2015, 8:46 AM

## 2015-04-21 NOTE — Progress Notes (Signed)
POD# 2 rectus flap to right groin/thigh, over sewing femoral vein  Temp:  [97.7 F (36.5 C)-100 F (37.8 C)] 98.7 F (37.1 C) (09/25 0800) Pulse Rate:  [74-103] 98 (09/25 0804) Resp:  [12-19] 12 (09/25 0804) BP: (106-150)/(58-93) 121/80 mmHg (09/25 0804) SpO2:  [90 %-100 %] 98 % (09/25 0804)   JP 5 VAC 125 PO 470 Recd 1 U PRBC  Therapeutic on Argatroban DVT study prelim LUE negative for DVT Attempted CT LUE for evaluation abscess- unable to position in scanner per notes  PE Abdomen dressing and  Right groin dressing changed- abdomen dry intact, groin with small amount serous dry drainage RLE edema less of leg and distal thigh, still large amount proximal thigh and groin with 2 area blistering of skin beneath VAC VAC with watery serosanguinous drainage LUE with edema improved, isolated erythema over distal wrist and epidermolysis noted over ballotable area  A/P S/p rectus muscle flap to prior abscess cavity over stumps of vessels  Anticipate VAC change at bedside on POD #4  Dry dressing to abdomen and right groin every other day and PRN  Abdomen SQ drain likely out in next day  Glenna Fellows, MD [facility] Plastic & Reconstructive Surgery [phone]

## 2015-04-22 ENCOUNTER — Encounter (HOSPITAL_COMMUNITY): Payer: Self-pay | Admitting: Plastic Surgery

## 2015-04-22 LAB — CBC WITH DIFFERENTIAL/PLATELET
BASOS ABS: 0 10*3/uL (ref 0.0–0.1)
Basophils Relative: 0 %
EOS ABS: 0.1 10*3/uL (ref 0.0–0.7)
Eosinophils Relative: 1 %
HCT: 24.9 % — ABNORMAL LOW (ref 39.0–52.0)
HEMOGLOBIN: 7.6 g/dL — AB (ref 13.0–17.0)
LYMPHS PCT: 12 %
Lymphs Abs: 1 10*3/uL (ref 0.7–4.0)
MCH: 26.7 pg (ref 26.0–34.0)
MCHC: 30.5 g/dL (ref 30.0–36.0)
MCV: 87.4 fL (ref 78.0–100.0)
MONOS PCT: 13 %
Monocytes Absolute: 1.1 10*3/uL — ABNORMAL HIGH (ref 0.1–1.0)
NEUTROS PCT: 74 %
Neutro Abs: 6 10*3/uL (ref 1.7–7.7)
Platelets: 440 10*3/uL — ABNORMAL HIGH (ref 150–400)
RBC: 2.85 MIL/uL — ABNORMAL LOW (ref 4.22–5.81)
RDW: 16.6 % — ABNORMAL HIGH (ref 11.5–15.5)
WBC: 8.2 10*3/uL (ref 4.0–10.5)

## 2015-04-22 LAB — COMPREHENSIVE METABOLIC PANEL
ALK PHOS: 136 U/L — AB (ref 38–126)
ALT: 17 U/L (ref 17–63)
ANION GAP: 6 (ref 5–15)
AST: 55 U/L — ABNORMAL HIGH (ref 15–41)
Albumin: 1.4 g/dL — ABNORMAL LOW (ref 3.5–5.0)
BUN: 7 mg/dL (ref 6–20)
CALCIUM: 7.7 mg/dL — AB (ref 8.9–10.3)
CO2: 30 mmol/L (ref 22–32)
Chloride: 102 mmol/L (ref 101–111)
Creatinine, Ser: 0.93 mg/dL (ref 0.61–1.24)
GFR calc Af Amer: >60 mL/min (ref >60)
GFR calc non Af Amer: >60 mL/min (ref >60)
Glucose, Bld: 103 mg/dL — ABNORMAL HIGH (ref 65–99)
POTASSIUM: 3.3 mmol/L — AB (ref 3.5–5.1)
SODIUM: 138 mmol/L (ref 135–145)
Total Bilirubin: 0.6 mg/dL (ref 0.3–1.2)
Total Protein: 6.3 g/dL — ABNORMAL LOW (ref 6.5–8.1)

## 2015-04-22 LAB — TYPE AND SCREEN
ABO/RH(D): A POS
Antibody Screen: NEGATIVE
Unit division: 0
Unit division: 0
Unit division: 0

## 2015-04-22 LAB — APTT: APTT: 74 s — AB (ref 24–37)

## 2015-04-22 LAB — PREPARE RBC (CROSSMATCH)

## 2015-04-22 LAB — MAGNESIUM: MAGNESIUM: 1.8 mg/dL (ref 1.7–2.4)

## 2015-04-22 MED ORDER — ARGATROBAN 50 MG/50ML IV SOLN
0.4700 ug/kg/min | INTRAVENOUS | Status: DC
  Administered 2015-04-23 – 2015-04-24 (×2): 0.47 ug/kg/min via INTRAVENOUS
  Filled 2015-04-22 (×2): qty 50

## 2015-04-22 MED ORDER — SODIUM CHLORIDE 0.9 % IV SOLN
Freq: Once | INTRAVENOUS | Status: AC
  Administered 2015-04-22: 12:00:00 via INTRAVENOUS

## 2015-04-22 MED ORDER — POTASSIUM CHLORIDE CRYS ER 20 MEQ PO TBCR
40.0000 meq | EXTENDED_RELEASE_TABLET | Freq: Every day | ORAL | Status: DC
  Administered 2015-04-22: 40 meq via ORAL
  Filled 2015-04-22 (×2): qty 2

## 2015-04-22 MED ORDER — CLONAZEPAM 0.5 MG PO TABS
0.5000 mg | ORAL_TABLET | Freq: Four times a day (QID) | ORAL | Status: DC | PRN
  Administered 2015-04-22 – 2015-04-30 (×24): 0.5 mg via ORAL
  Filled 2015-04-22 (×25): qty 1

## 2015-04-22 NOTE — Consult Note (Signed)
Reason for Consult: Gangrenous thromboembolic injury to the right foot Referring Physician: Dr. Nile Dear is an 59 y.o. male.  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 Patient underwent limb salvage surgery and subsequent did develop some thromboembolic changes to the right foot. Patient is seen for evaluation for right foot salvage.  Past Medical History  Diagnosis Date  . Hypertension   . Chronic knee pain   . Hyperlipemia   . PUD (peptic ulcer disease)     Past Surgical History  Procedure Laterality Date  . I&d extremity Left 07/12/2013    Procedure: I&D Left Thigh Abscess;  Surgeon: Wylene Simmer, MD;  Location: [place];  Service: Orthopedics;  Laterality: Left;  . Replacement total knee bilateral    . Tonsillectomy    . Hernia repair    . Cholecystectomy    . Appendectomy    . Shoulder surgery    . Elbow surgery    . Femoral artery exploration Right 04/11/2015    Procedure: Resection of infected right femoral artery aneurysm;  Surgeon: Conrad Fruitland, MD;  Location: [place];  Service: Vascular;  Laterality: Right;  . Incision and drainage abscess Right 04/11/2015    Procedure: INCISION AND DRAINAGE OF RIGHT THIGH ABSCESS;  Surgeon: Conrad [place], MD;  Location: [place];  Service: Vascular;  Laterality: Right;  . Incision and drainage abscess Right 04/13/2015    Procedure: INCISION AND DRAINAGE THIGH ABSCESS;  Surgeon: Conrad Accomack, MD;  Location: [place];  Service: Vascular;  Laterality: Right;  . I&d extremity Left 04/17/2015    Procedure: IRRIGATION AND DEBRIDEMENT LEFT EXTREMITY;  Surgeon: Conrad Julian, MD;  Location: [place];  Service: Vascular;  Laterality: Left;  Marland Kitchen Muscle flap closure Right 04/19/2015    Procedure: RIGHT RECTUS ABDOMINUS  FLAP TO RIGHT GROIN;  Surgeon: Irene Limbo, MD;  Location: [place];  Service: Plastics;  Laterality: Right;  . Femoral revision Right 04/19/2015    Procedure: OVERSEW RIGHT FEMORAL VEIN ;   Surgeon: Angelia Mould, MD;  Location: [facility];  Service: Vascular;  Laterality: Right;    Family History  Problem Relation Age of Onset  . Heart attack Paternal Grandmother 81  . Heart disease Mother 75    arrythmia  . Stroke Paternal Grandfather   . Stroke Maternal Grandfather     Social History:  reports that he has quit smoking. His smoking use included Cigarettes. He does not have any smokeless tobacco history on file. He reports that he does not drink alcohol or use illicit drugs.  Allergies:  Allergies  Allergen Reactions  . Imitrex [Sumatriptan] Other (See Comments)    Chest pain, 2004 (tolerates 50 mg prn)  . Verapamil Other (See Comments)    Causes pvc's    Medications: I have reviewed the patient's current medications.  Results for orders placed or performed during the hospital encounter of 04/06/15 (from the past 48 hour(s))  APTT     Status: Abnormal   Collection Time: 04/21/15  5:20 AM  Result Value Ref Range   aPTT 77 (H) 24 - 37 seconds    Comment:        IF BASELINE aPTT IS ELEVATED, SUGGEST PATIENT RISK ASSESSMENT BE USED TO DETERMINE APPROPRIATE ANTICOAGULANT THERAPY.   Comprehensive metabolic panel     Status: Abnormal   Collection Time: 04/21/15  5:20 AM  Result Value Ref Range   Sodium 136 135 -  145 mmol/L   Potassium 3.4 (L) 3.5 - 5.1 mmol/L   Chloride 99 (L) 101 - 111 mmol/L   CO2 30 22 - 32 mmol/L   Glucose, Bld 110 (H) 65 - 99 mg/dL   BUN 8 6 - 20 mg/dL   Creatinine, Ser 1.00 0.61 - 1.24 mg/dL   Calcium 7.5 (L) 8.9 - 10.3 mg/dL   Total Protein 6.0 (L) 6.5 - 8.1 g/dL   Albumin 1.4 (L) 3.5 - 5.0 g/dL   AST 33 15 - 41 U/L   ALT 10 (L) 17 - 63 U/L   Alkaline Phosphatase 132 (H) 38 - 126 U/L   Total Bilirubin 0.5 0.3 - 1.2 mg/dL   GFR calc non Af Amer >60 >60 mL/min   GFR calc Af Amer >60 >60 mL/min    Comment: (NOTE) The eGFR has been calculated using the CKD EPI equation. This calculation has not been validated in all clinical  situations. eGFR's persistently <60 mL/min signify possible Chronic Kidney Disease.    Anion gap 7 5 - 15  CBC with Differential/Platelet     Status: Abnormal   Collection Time: 04/21/15  5:20 AM  Result Value Ref Range   WBC 10.1 4.0 - 10.5 K/uL   RBC 2.96 (L) 4.22 - 5.81 MIL/uL   Hemoglobin 8.1 (L) 13.0 - 17.0 g/dL   HCT 25.9 (L) 39.0 - 52.0 %   MCV 87.5 78.0 - 100.0 fL   MCH 27.4 26.0 - 34.0 pg   MCHC 31.3 30.0 - 36.0 g/dL   RDW 16.7 (H) 11.5 - 15.5 %   Platelets 434 (H) 150 - 400 K/uL   Neutrophils Relative % 77 %   Neutro Abs 7.8 (H) 1.7 - 7.7 K/uL   Lymphocytes Relative 11 %   Lymphs Abs 1.1 0.7 - 4.0 K/uL   Monocytes Relative 11 %   Monocytes Absolute 1.1 (H) 0.1 - 1.0 K/uL   Eosinophils Relative 1 %   Eosinophils Absolute 0.1 0.0 - 0.7 K/uL   Basophils Relative 0 %   Basophils Absolute 0.0 0.0 - 0.1 K/uL  Magnesium     Status: Abnormal   Collection Time: 04/21/15  5:20 AM  Result Value Ref Range   Magnesium 1.5 (L) 1.7 - 2.4 mg/dL  APTT     Status: Abnormal   Collection Time: 04/22/15  5:32 AM  Result Value Ref Range   aPTT 74 (H) 24 - 37 seconds    Comment:        IF BASELINE aPTT IS ELEVATED, SUGGEST PATIENT RISK ASSESSMENT BE USED TO DETERMINE APPROPRIATE ANTICOAGULANT THERAPY.   Comprehensive metabolic panel     Status: Abnormal   Collection Time: 04/22/15  5:32 AM  Result Value Ref Range   Sodium 138 135 - 145 mmol/L   Potassium 3.3 (L) 3.5 - 5.1 mmol/L   Chloride 102 101 - 111 mmol/L   CO2 30 22 - 32 mmol/L   Glucose, Bld 103 (H) 65 - 99 mg/dL   BUN 7 6 - 20 mg/dL   Creatinine, Ser 0.93 0.61 - 1.24 mg/dL   Calcium 7.7 (L) 8.9 - 10.3 mg/dL   Total Protein 6.3 (L) 6.5 - 8.1 g/dL   Albumin 1.4 (L) 3.5 - 5.0 g/dL   AST 55 (H) 15 - 41 U/L   ALT 17 17 - 63 U/L   Alkaline Phosphatase 136 (H) 38 - 126 U/L   Total Bilirubin 0.6 0.3 - 1.2 mg/dL   GFR calc non  Af Amer >60 >60 mL/min   GFR calc Af Amer >60 >60 mL/min    Comment: (NOTE) The eGFR has been  calculated using the CKD EPI equation. This calculation has not been validated in all clinical situations. eGFR's persistently <60 mL/min signify possible Chronic Kidney Disease.    Anion gap 6 5 - 15  CBC with Differential/Platelet     Status: Abnormal   Collection Time: 04/22/15  5:32 AM  Result Value Ref Range   WBC 8.2 4.0 - 10.5 K/uL    Comment: WHITE COUNT CONFIRMED ON SMEAR   RBC 2.85 (L) 4.22 - 5.81 MIL/uL   Hemoglobin 7.6 (L) 13.0 - 17.0 g/dL   HCT 24.9 (L) 39.0 - 52.0 %   MCV 87.4 78.0 - 100.0 fL   MCH 26.7 26.0 - 34.0 pg   MCHC 30.5 30.0 - 36.0 g/dL   RDW 16.6 (H) 11.5 - 15.5 %   Platelets 440 (H) 150 - 400 K/uL    Comment: PLATELET COUNT CONFIRMED BY SMEAR REPEATED TO VERIFY    Neutrophils Relative % 74 %   Lymphocytes Relative 12 %   Monocytes Relative 13 %   Eosinophils Relative 1 %   Basophils Relative 0 %   Neutro Abs 6.0 1.7 - 7.7 K/uL   Lymphs Abs 1.0 0.7 - 4.0 K/uL   Monocytes Absolute 1.1 (H) 0.1 - 1.0 K/uL   Eosinophils Absolute 0.1 0.0 - 0.7 K/uL   Basophils Absolute 0.0 0.0 - 0.1 K/uL   RBC Morphology POLYCHROMASIA PRESENT   Magnesium     Status: None   Collection Time: 04/22/15  5:32 AM  Result Value Ref Range   Magnesium 1.8 1.7 - 2.4 mg/dL  Prepare RBC     Status: None   Collection Time: 04/22/15 12:15 PM  Result Value Ref Range   Order Confirmation ORDER PROCESSED BY BLOOD BANK   Type and screen     Status: None (Preliminary result)   Collection Time: 04/22/15 12:15 PM  Result Value Ref Range   ABO/RH(D) A POS    Antibody Screen NEG    Sample Expiration 04/25/2015    Unit Number [id number]    Blood Component Type RED CELLS,LR    Unit division 00    Status of Unit ISSUED    Transfusion Status OK TO TRANSFUSE    Crossmatch Result Compatible    Unit Number [id number]    Blood Component Type RED CELLS,LR    Unit division 00    Status of Unit ISSUED    Transfusion Status OK TO TRANSFUSE    Crossmatch Result Compatible     No  results found.  Review of Systems  All other systems reviewed and are negative.  Blood pressure 128/85, pulse 93, temperature 98.8 F (37.1 C), temperature source Oral, resp. rate 15, height _0  (1.905 m), weight 103.42 kg (228 lb), SpO2 98 %. Physical Exam On examination patient has massive swelling of the right lower extremity. I cannot palpate a pulse due to the swelling. He has some mild redness through the toes but no ascending cellulitis. There is multiple areas of petechial black gangrenous changes involving the hindfoot midfoot and some gangrenous changes to the toes on the plantar aspect worse on the great toe than the lesser toes. Assessment/Plan: Assessment: Multiple gangrenous changes to the right foot secondary to a thromboembolic event.  Plan: I will order compressive stockings to help decrease the swelling in the right lower extremity to try to improve his  microcirculation. I will follow-up as an outpatient. At this time I do not see indication for urgent surgical intervention and patient may be able to heal most of these gangrenous changes without surgical intervention. The biggest concern is great toe and I will follow this conservatively as well. I will place him in my medical compressive stockings once he is discharged from the hospital.  DUDA,MARCUS V 04/22/2015, 8:05 PM

## 2015-04-22 NOTE — Progress Notes (Signed)
Right quadrant JP drain removed per MD order. Patient tolerated procedure well. Will continue to monitor.

## 2015-04-22 NOTE — Progress Notes (Signed)
POD# 3 rectus flap to right groin/thigh, over sewing femoral vein  Temp:  [98.5 F (36.9 C)-99.1 F (37.3 C)] 98.7 F (37.1 C) (09/26 0349) Pulse Rate:  [92-99] 99 (09/26 0349) Resp:  [9-20] 12 (09/26 0409) BP: (121-136)/(76-83) 136/83 mmHg (09/26 0349) SpO2:  [96 %-100 %] 100 % (09/26 0409)   JP 10 VAC 25  Notes drainage on linen  PE Abdomen dressing and  Right groin dressing dry, changed Right groin dressing and incision intact here Overall RLE edema less of leg and distal thigh, some blistering beneath VAC , improved erythema VAC with watery drainage in cannister Linens saturated  A/P S/p rectus muscle flap to prior abscess cavity over stumps of vessels  VAC change tomorrow- d/w patient will need to abduct thigh and place pressure on scrotum to change, patient does not think he can do this awake and will plan under sedation. Discussed if needed will place drain thigh if any deeper fluid pockets Ok to keep Argatroban running for this, ok to switch to Lovenox from my standpoint.  NPO p MN.  Per notes, consult [name] for evaluation foot todat  Dry dressing to abdomen and right groin every other day and PRN  Abdomen SQ drain l- d/c today  Glenna Fellows, MD [facility] Plastic & Reconstructive Surgery [phone]

## 2015-04-22 NOTE — Progress Notes (Signed)
   Daily Progress Note  Assessment/Planning: POD #3 s/p L FV ligation due to rupture, L rectus flap to R groin POD #11 s/p Excision of ruptured R mycotic SFA, I&D right thigh abscess S/p multiple washout R thigh, VAC placement RLE DVT   Right foot findings stable: will get Dr. Audrie Lia opinion on salvageability of right foot  Plastics' help appreciated.  Note and plan reviewed  No evidence of active bleeding to date  Subjective  - 3 Days Post-Op  Feeling better  Objective Filed Vitals:   04/21/15 2335 04/22/15 0025 04/22/15 0349 04/22/15 0409  BP: 134/83  136/83   Pulse: 92  99   Temp: 99 F (37.2 C)  98.7 F (37.1 C)   TempSrc: Oral  Oral   Resp: Height:      Weight:      SpO2: 99% 97% 99% 100%    Intake/Output Summary (Last 24 hours) at 04/22/15 0819 Last data filed at 04/22/15 0600  Gross per 24 hour  Intake 2123.8 ml  Output   2080 ml  Net   43.8 ml    VASC  R groin VAC adherent, R foot unchanged: 1st and 2nd toe likely will need amputation; thromboembolic skin changes throughout foot, heel blistering appears to have drained  Laboratory CBC    Component Value Date/Time   WBC 8.2 04/22/2015 0532   HGB 7.6* 04/22/2015 0532   HCT 24.9* 04/22/2015 0532   PLT 440* 04/22/2015 0532    BMET    Component Value Date/Time   NA 138 04/22/2015 0532   K 3.3* 04/22/2015 0532   CL 102 04/22/2015 0532   CO2 30 04/22/2015 0532   GLUCOSE 103* 04/22/2015 0532   BUN 7 04/22/2015 0532   CREATININE 0.93 04/22/2015 0532   CALCIUM 7.7* 04/22/2015 0532   GFRNONAA >60 04/22/2015 0532   GFRAA >60 04/22/2015 0532    Leonides Sake, MD [facility] Office: [phone] Pager: [phone]  04/22/2015, 8:19 AM

## 2015-04-22 NOTE — Progress Notes (Signed)
ANTICOAGULATION CONSULT NOTE - Follow Up Consult  Pharmacy Consult for Argatroban Indication: Thromboembolism to Right Foot  Allergies  Allergen Reactions  . Imitrex [Sumatriptan] Other (See Comments)    Chest pain, 2004 (tolerates 50 mg prn)  . Verapamil Other (See Comments)    Causes pvc's    Patient Measurements: Height:  (190.5 cm) Weight: 228 lb (103.42 kg) IBW/kg (Calculated) : 84.5  Vital Signs: Temp: 98.2 F (36.8 C) (09/26 1137) Temp Source: Oral (09/26 1137) BP: 137/82 mmHg (09/26 1050) Pulse Rate: 90 (09/26 1050)  Labs:  Recent Labs  04/20/15 0459 04/21/15 0520 04/22/15 0532  HGB 7.6* 8.1* 7.6*  HCT 23.9* 25.9* 24.9*  PLT 492* 434* 440*  APTT 58* 77* 74*  CREATININE 1.11 1.00 0.93    Estimated Creatinine Clearance: 114.2 mL/min (by C-G formula based on Cr of 0.93).   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 Pharmacy consulted for anticoagulation for right foot thromboembolism secondary to R groin aneurysm. He is s/p I&D 9/15, 9/19, and 9/21 and surgery on 9/23. Heparin levels remained undetectable on 3800 units/hr (hard max of 3999 units/hr), so Argatroban started on 04/15/14 per discussion with vascular surgery (do not want lovenox at this point due to impending surgeries).  aPTT is therapeutic at 74 sec on 0.47 mcg/kg/min. No bleeding noted, Hb low stable, platelets are normal.  Goal of Therapy:  PTT = 50 to 90 seconds Monitor platelets by anticoagulation protocol: Yes    Plan:   -Continue argatroban at 0.47 mcg/kg/min -CBC and aPTT at least once daily -Monitor for s/sx of bleeding -Consider switching to Lovenox when surgeries are complete  [facility], Lennon.D., BCPS Clinical Pharmacist Pager: [phone] 04/22/2015 1:52 PM

## 2015-04-22 NOTE — Progress Notes (Signed)
[facility] TEAM 1 - Stepdown/ICU TEAM PROGRESS NOTE  [name] ZOX:[id number] DOB: 1955/08/05 DOA: 04/06/2015 PCP: [name], MD  Admit HPI / Brief Narrative: 60 y.o. Male with PUD, HLD, history of MRSA abscesses, peripheral neuropathy, and chronic pain who presented to [facility] with severe left hand pain. He was found to be septic with acute kidney injury and left hand cellulitis. After admission he was noted to have a dusky swollen right foot. The patient reported that approximately 2 weeks prior he started feeling poorly and eating less. 9/7 he could not feel his right foot and fell as a result. He landed on his left wrist, shoulder, and back side. Since then his left hand has been very painful and his wrist and arm had begun to swell. He has developed thick erythematous streaking on his left arm.    In the ER: Lactic acid was 4.16, INR is 2.26 (not on anticoag), PTT is 24.7, WBC is 31.3, mag 1.3, potassium 2.7, procalcitonin 9.72. BP was 80/68 He received 3L of IV bolus in the ER.  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 Reportedly the patient injected his groin with testosterone 3 weeks ago.  HPI/Subjective: The patient states his pain is well controlled but complains of poorly controlled anxiety.  He is also noted increased swelling in his right lower extremity.  He denies shortness of breath chest pain or abdominal pain.  Assessment/Plan:  Severe sepsis - MSSA bacteremia  -Multiple potential initial sources: L hand cellulitis, UTI/pyelonephritis, R groin puncture/aneursym - I suspect his initial site was the R groin injection site  -Patient was hypotensive, tachycardic, with a white count of 31.3 on initial workup = sepsis  -Continue antibiotics as per ID -ID suggest will need TEE when more stable clinically - Cards felt  that TTE would suffice - TTE did not note vegetation, but given serious nature of his illness I feel most  comfortable w/ TEE to better evaluate his valves and help determine if a longer course of abx is indicated, or even valve procedure - will ask Cards to reconsider TEE once pt more stable from his recent surgerys   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 Imogene Burn to consult [name] today to evaluate foot   R femoral vein disruption/DVT - severe R LE / groin edema  In OR 9/23 became clear femoral vein involved in infection, and fully thrombosed - Vasc Surgery oversewed the divided vessel at the time of surgery 9/23  Acute blood loss anemia Due to infected vascular region s/p multiple surgeries + sepsis - transfuse as needed to keep Hgb 7.0 or >  Sinus tachycardia > SVT > [facility]  Cardiology has completed an evaluation and recommends adenosine 6 mg should the patient suffer another episode of atrial tachycardia  Acute renal failure Creatinine is 0.9 at baseline - 4.4 on admission - has now normalized   L arm swelling - Left hand cellulitis with radius fracture - history of MRSA abscess Evaluated by [name] initially, w/ his plan being for the pt to follow-up in his office in approximately  2 weeks - swelling of arm noted 9/23 - venous duplex 9/24 w/o evidence of DVT - Dr. Joseph Art attempted to get Hand Surgery to re-evaluate over the weekend, w/o success - will reconsult [name] 9/27 as he is on call that day   Possible pyelonephritis Urinalysis suggestive of infection - adequately covered with broad-spectrum empiric antibiotics - urine culture not helpful   Elevated INR with coagulopathy Felt to be due to sepsis - resolved    Hypokalemia and hypomagnesemia Recurrent - replace as needed   Chronic pain has been on pain medications for 12 years - managed by Dr. [name] [facility]?) - also takes high dose scheduled klonopin - pain currently controlled on PCA  Code Status: FULL Family Communication: No family present at time of exam today Disposition Plan: SDU  Consultants: Hand Surgery Vascular Surgery  ID Cardiology - [facility] Plastic surgery  Antibiotics: Zosyn 9/10 > 9/12 Vancomycin 9/10 > 9/15 Cefazolin 9/14 >  DVT prophylaxis: argatroban  Objective: Blood pressure 148/84, pulse 98, temperature 99.2 F (37.3 C), temperature source Oral, resp. rate 14, height  (1.905 m), weight 103.42 kg (228 lb), SpO2 96 %.  Intake/Output Summary (Last 24 hours) at 04/22/15 1526 Last data filed at 04/22/15 1137  Gross per 24 hour  Intake 1458.5 ml  Output   1990 ml  Net -531.5 ml   Exam: General: No acute respiratory distress -  alert and conversant  Lungs: Clear to auscultation bilaterally w/o wheeze  Cardiovascular:  regular rate and rhythm without appreciable murmur Abdomen: Nontender, nondistended, soft, bowel sounds positive, no rebound, no ascites, no appreciable mass Extremities:  1+ edema left upper extremity with no significant erythema of the wrist or left hand, right lower extremity with 3+ plus edema - multiple areas of nonviable skin across digits right foot without severe surrounding erythema or discharge  Data Reviewed: Basic Metabolic Panel:  Recent Labs Lab 04/18/15 0425 04/19/15 0405 04/20/15 0459 04/21/15 0520 04/22/15 0532  NA 137 136 138 136 138  K 3.4* 3.1* 3.3* 3.4* 3.3*  CL 104 102 101 99* 102  CO2 GLUCOSE 124* 97 137* 110* 103*  BUN CREATININE 1.10 1.06 1.11 1.00 0.93  CALCIUM 7.5* 7.6* 7.6* 7.5* 7.7*  MG  --   --   --  1.5* 1.8    CBC:  Recent Labs Lab 04/18/15 0425 04/19/15 0405 04/20/15 0459 04/21/15 0520 04/22/15 0532  WBC  9.0 9.5 10.0 10.1 8.2  NEUTROABS  --  7.1 7.7 7.8* 6.0  HGB 7.4* 8.0* 7.6* 8.1* 7.6*  HCT 23.7* 25.0* 23.9* 25.9* 24.9*  MCV 86.2 87.4 87.9 87.5 87.4  PLT 588* 544* 492* 434* 440*    Liver Function Tests:  Recent Labs Lab 04/16/15 0256 04/19/15 0405 04/20/15 0459 04/21/15 0520 04/22/15 0532  AST 37 36 38 33 55*  ALT 11* 13* 11* 10* 17  ALKPHOS 274* 188* 171* 132* 136*  BILITOT 0.8 0.9 0.6 0.5 0.6  PROT 5.7* 6.0* 6.2* 6.0* 6.3*  ALBUMIN 1.1* 1.3* 1.3* 1.4* 1.4*   Coags:  Recent Labs Lab 04/19/15 2119 04/20/15 04/20/15 0459 04/21/15 0520 04/22/15 0532  APTT 53* 56* 58* 77* 74*    Studies:   Recent x-ray studies have been reviewed in detail by the Attending Physician  Scheduled Meds:  Scheduled Meds: . sodium chloride   Intravenous Once  .  ceFAZolin (ANCEF) IV  2 g Intravenous 3 times per day  .  docusate sodium  100 mg Oral BID  . feeding supplement (ENSURE ENLIVE)  237 mL Oral TID BM  . metoprolol tartrate  25 mg Oral BID  . morphine  90 mg Oral 2 times per day  . morphine   Intravenous 6 times per day  . naloxegol oxalate  25 mg Oral Daily  . sodium chloride  3 mL Intravenous Q12H  . venlafaxine XR  75 mg Oral Q breakfast    Time spent on care of this patient: 35 mins   MCCLUNG,JEFFREY T , MD   [facility] Office  [phone] Pager - Text Page per Loretha Stapler as per below:  On-Call/Text Page:      [email or web address]      password [email or web address]  If 7PM-7AM, please contact night-coverage [email or web address] Password [email or web address] 04/22/2015, 3:26 PM   LOS: 15 days

## 2015-04-23 ENCOUNTER — Inpatient Hospital Stay (HOSPITAL_COMMUNITY): Payer: Medicaid Other | Admitting: Anesthesiology

## 2015-04-23 ENCOUNTER — Encounter (HOSPITAL_COMMUNITY): Payer: Self-pay | Admitting: Certified Registered Nurse Anesthetist

## 2015-04-23 ENCOUNTER — Encounter (HOSPITAL_COMMUNITY)
Admission: EM | Disposition: A | Discharge status: Rehabilitation Facility | Payer: Self-pay | Source: Home / Self Care | Attending: Internal Medicine

## 2015-04-23 DIAGNOSIS — L03114 Cellulitis of left upper limb: Secondary | ICD-10-CM | POA: Diagnosis present

## 2015-04-23 DIAGNOSIS — I519 Heart disease, unspecified: Secondary | ICD-10-CM

## 2015-04-23 DIAGNOSIS — I5189 Other ill-defined heart diseases: Secondary | ICD-10-CM | POA: Diagnosis present

## 2015-04-23 DIAGNOSIS — S52532A Colles' fracture of left radius, initial encounter for closed fracture: Secondary | ICD-10-CM | POA: Diagnosis present

## 2015-04-23 DIAGNOSIS — A4101 Sepsis due to Methicillin susceptible Staphylococcus aureus: Secondary | ICD-10-CM | POA: Diagnosis present

## 2015-04-23 HISTORY — PX: WOUND EXPLORATION: SHX6188

## 2015-04-23 LAB — TYPE AND SCREEN
ABO/RH(D): A POS
Antibody Screen: NEGATIVE
UNIT DIVISION: 0
Unit division: 0

## 2015-04-23 LAB — COMPREHENSIVE METABOLIC PANEL
ALBUMIN: 1.5 g/dL — AB (ref 3.5–5.0)
ALK PHOS: 156 U/L — AB (ref 38–126)
ALT: 20 U/L (ref 17–63)
AST: 60 U/L — AB (ref 15–41)
Anion gap: 8 (ref 5–15)
BILIRUBIN TOTAL: 0.9 mg/dL (ref 0.3–1.2)
BUN: 8 mg/dL (ref 6–20)
CALCIUM: 7.6 mg/dL — AB (ref 8.9–10.3)
CO2: 29 mmol/L (ref 22–32)
CREATININE: 0.97 mg/dL (ref 0.61–1.24)
Chloride: 99 mmol/L — ABNORMAL LOW (ref 101–111)
GFR calc Af Amer: >60 mL/min (ref >60)
GLUCOSE: 98 mg/dL (ref 65–99)
Potassium: 3.4 mmol/L — ABNORMAL LOW (ref 3.5–5.1)
Sodium: 136 mmol/L (ref 135–145)
TOTAL PROTEIN: 6.3 g/dL — AB (ref 6.5–8.1)

## 2015-04-23 LAB — APTT: APTT: 68 s — AB (ref 24–37)

## 2015-04-23 LAB — CBC WITH DIFFERENTIAL/PLATELET
BASOS ABS: 0 10*3/uL (ref 0.0–0.1)
BASOS PCT: 1 %
EOS ABS: 0.2 10*3/uL (ref 0.0–0.7)
EOS PCT: 2 %
HCT: 28.1 % — ABNORMAL LOW (ref 39.0–52.0)
Hemoglobin: 8.9 g/dL — ABNORMAL LOW (ref 13.0–17.0)
Lymphocytes Relative: 17 %
Lymphs Abs: 1.4 10*3/uL (ref 0.7–4.0)
MCH: 27.6 pg (ref 26.0–34.0)
MCHC: 31.7 g/dL (ref 30.0–36.0)
MCV: 87.3 fL (ref 78.0–100.0)
MONO ABS: 1 10*3/uL (ref 0.1–1.0)
Monocytes Relative: 12 %
Neutro Abs: 5.6 10*3/uL (ref 1.7–7.7)
Neutrophils Relative %: 68 %
PLATELETS: 409 10*3/uL — AB (ref 150–400)
RBC: 3.22 MIL/uL — ABNORMAL LOW (ref 4.22–5.81)
RDW: 16.3 % — AB (ref 11.5–15.5)
WBC: 8.3 10*3/uL (ref 4.0–10.5)

## 2015-04-23 LAB — MAGNESIUM: MAGNESIUM: 1.6 mg/dL — AB (ref 1.7–2.4)

## 2015-04-23 SURGERY — WOUND EXPLORATION
Anesthesia: General | Site: Thigh | Laterality: Right

## 2015-04-23 MED ORDER — PROPOFOL 10 MG/ML IV BOLUS
INTRAVENOUS | Status: AC
  Filled 2015-04-23: qty 20

## 2015-04-23 MED ORDER — POTASSIUM CHLORIDE CRYS ER 20 MEQ PO TBCR
60.0000 meq | EXTENDED_RELEASE_TABLET | Freq: Every day | ORAL | Status: AC
  Administered 2015-04-23: 20 meq via ORAL
  Administered 2015-04-23: 40 meq via ORAL
  Filled 2015-04-23: qty 3

## 2015-04-23 MED ORDER — METOCLOPRAMIDE HCL 5 MG/ML IJ SOLN
INTRAMUSCULAR | Status: DC | PRN
  Administered 2015-04-23: 10 mg via INTRAVENOUS

## 2015-04-23 MED ORDER — MAGNESIUM SULFATE 4 GM/100ML IV SOLN
4.0000 g | Freq: Once | INTRAVENOUS | Status: AC
  Administered 2015-04-23: 4 g via INTRAVENOUS
  Filled 2015-04-23: qty 100

## 2015-04-23 MED ORDER — SODIUM CHLORIDE 0.9 % IR SOLN
Status: DC | PRN
  Administered 2015-04-23: 1000 mL

## 2015-04-23 MED ORDER — FENTANYL CITRATE (PF) 100 MCG/2ML IJ SOLN
INTRAMUSCULAR | Status: DC | PRN
  Administered 2015-04-23: 100 ug via INTRAVENOUS

## 2015-04-23 MED ORDER — ONDANSETRON HCL 4 MG/2ML IJ SOLN
INTRAMUSCULAR | Status: DC | PRN
  Administered 2015-04-23: 4 mg via INTRAVENOUS

## 2015-04-23 MED ORDER — LIDOCAINE HCL (PF) 1 % IJ SOLN
INTRAMUSCULAR | Status: AC
  Administered 2015-04-23: 18:00:00
  Filled 2015-04-23: qty 5

## 2015-04-23 MED ORDER — MIDAZOLAM HCL 2 MG/2ML IJ SOLN
INTRAMUSCULAR | Status: AC
  Filled 2015-04-23: qty 4

## 2015-04-23 MED ORDER — METOCLOPRAMIDE HCL 5 MG/ML IJ SOLN
INTRAMUSCULAR | Status: AC
  Filled 2015-04-23: qty 2

## 2015-04-23 MED ORDER — FENTANYL CITRATE (PF) 250 MCG/5ML IJ SOLN
INTRAMUSCULAR | Status: AC
  Filled 2015-04-23: qty 5

## 2015-04-23 MED ORDER — PHENYLEPHRINE HCL 10 MG/ML IJ SOLN
INTRAMUSCULAR | Status: DC | PRN
  Administered 2015-04-23: 80 ug via INTRAVENOUS
  Administered 2015-04-23: 120 ug via INTRAVENOUS

## 2015-04-23 MED ORDER — LACTATED RINGERS IV SOLN
INTRAVENOUS | Status: DC | PRN
  Administered 2015-04-23: 12:00:00 via INTRAVENOUS

## 2015-04-23 MED ORDER — PHENYLEPHRINE 40 MCG/ML (10ML) SYRINGE FOR IV PUSH (FOR BLOOD PRESSURE SUPPORT)
PREFILLED_SYRINGE | INTRAVENOUS | Status: AC
  Filled 2015-04-23: qty 10

## 2015-04-23 MED ORDER — LACTATED RINGERS IV SOLN
INTRAVENOUS | Status: DC
Start: 2015-04-23 — End: 2015-04-24
  Administered 2015-04-23: 12:00:00 via INTRAVENOUS

## 2015-04-23 MED ORDER — PROPOFOL 10 MG/ML IV BOLUS
INTRAVENOUS | Status: DC | PRN
  Administered 2015-04-23: 30 mg via INTRAVENOUS
  Administered 2015-04-23: 200 mg via INTRAVENOUS

## 2015-04-23 SURGICAL SUPPLY — 38 items
BANDAGE ELASTIC 4 VELCRO ST LF (GAUZE/BANDAGES/DRESSINGS) IMPLANT
BLADE 10 SAFETY STRL DISP (BLADE) ×1 IMPLANT
BLADE SURG ROTATE 9660 (MISCELLANEOUS) IMPLANT
BNDG GAUZE ELAST 4 BULKY (GAUZE/BANDAGES/DRESSINGS) IMPLANT
CANISTER SUCTION 2500CC (MISCELLANEOUS) ×3 IMPLANT
CANISTER WOUND CARE 500ML ATS (WOUND CARE) ×2 IMPLANT
CHLORAPREP W/TINT 26ML (MISCELLANEOUS) IMPLANT
COVER SURGICAL LIGHT HANDLE (MISCELLANEOUS) ×3 IMPLANT
DRAIN CHANNEL 15F RND FF W/TCR (WOUND CARE) ×2 IMPLANT
DRAPE EXTREMITY T 121X128X90 (DRAPE) IMPLANT
DRAPE ORTHO SPLIT 77X108 STRL (DRAPES) ×3
DRAPE SURG ORHT 6 SPLT 77X108 (DRAPES) ×1 IMPLANT
DRSG ADAPTIC 3X8 NADH LF (GAUZE/BANDAGES/DRESSINGS) ×2 IMPLANT
DRSG PAD ABDOMINAL 8X10 ST (GAUZE/BANDAGES/DRESSINGS) IMPLANT
DRSG VAC ATS SM SENSATRAC (GAUZE/BANDAGES/DRESSINGS) ×2 IMPLANT
ELECT REM PT RETURN 9FT ADLT (ELECTROSURGICAL) ×3
ELECTRODE REM PT RTRN 9FT ADLT (ELECTROSURGICAL) ×1 IMPLANT
EVACUATOR SILICONE 100CC (DRAIN) ×2 IMPLANT
GAUZE SPONGE 4X4 12PLY STRL (GAUZE/BANDAGES/DRESSINGS) ×2 IMPLANT
GLOVE BIO SURGEON STRL SZ 6 (GLOVE) ×3 IMPLANT
GLOVE BIOGEL PI IND STRL 7.0 (GLOVE) IMPLANT
GLOVE BIOGEL PI INDICATOR 7.0 (GLOVE) ×2
GLOVE ECLIPSE 6.5 STRL STRAW (GLOVE) ×2 IMPLANT
GOWN STRL REUS W/ TWL LRG LVL3 (GOWN DISPOSABLE) ×2 IMPLANT
GOWN STRL REUS W/TWL LRG LVL3 (GOWN DISPOSABLE) ×6
HANDPIECE INTERPULSE COAX TIP (DISPOSABLE)
KIT BASIN OR (CUSTOM PROCEDURE TRAY) ×3 IMPLANT
KIT ROOM TURNOVER OR (KITS) ×3 IMPLANT
NS IRRIG 1000ML POUR BTL (IV SOLUTION) ×3 IMPLANT
PACK GENERAL/GYN (CUSTOM PROCEDURE TRAY) ×3 IMPLANT
PAD ARMBOARD 7.5X6 YLW CONV (MISCELLANEOUS) ×6 IMPLANT
SET HNDPC FAN SPRY TIP SCT (DISPOSABLE) IMPLANT
SUT ETHILON 2 0 FS 18 (SUTURE) ×2 IMPLANT
SUT PDS AB 2-0 CT1 27 (SUTURE) ×2 IMPLANT
TOWEL OR 17X24 6PK STRL BLUE (TOWEL DISPOSABLE) ×3 IMPLANT
TOWEL OR 17X26 10 PK STRL BLUE (TOWEL DISPOSABLE) ×3 IMPLANT
UNDERPAD 30X30 INCONTINENT (UNDERPADS AND DIAPERS) ×3 IMPLANT
WATER STERILE IRR 1000ML POUR (IV SOLUTION) IMPLANT

## 2015-04-23 NOTE — Anesthesia Preprocedure Evaluation (Signed)
Anesthesia Evaluation  Patient identified by MRN, date of birth, ID band Patient awake    Reviewed: Allergy & Precautions, NPO status , Patient's Chart, lab work & pertinent test results  Airway Mallampati: II  TM Distance: >3 FB Neck ROM: Full    Dental  (+) Teeth Intact, Dental Advisory Given   Pulmonary former smoker,    breath sounds clear to auscultation       Cardiovascular hypertension, + Peripheral Vascular Disease   Rhythm:Regular Rate:Normal     Neuro/Psych    GI/Hepatic PUD,   Endo/Other    Renal/GU      Musculoskeletal   Abdominal   Peds  Hematology  (+) anemia ,   Anesthesia Other Findings   Reproductive/Obstetrics                             Anesthesia Physical Anesthesia Plan  ASA: III  Anesthesia Plan: General LMA   Post-op Pain Management:    Induction: Intravenous  Airway Management Planned: LMA  Additional Equipment:   Intra-op Plan:   Post-operative Plan: Extubation in OR  Informed Consent: I have reviewed the patients History and Physical, chart, labs and discussed the procedure including the risks, benefits and alternatives for the proposed anesthesia with the patient or authorized representative who has indicated his/her understanding and acceptance.   Dental advisory given  Plan Discussed with: CRNA and Surgeon  Anesthesia Plan Comments:         Anesthesia Quick Evaluation

## 2015-04-23 NOTE — Progress Notes (Signed)
[facility] TEAM 1 - Stepdown/ICU TEAM Progress Note  [name] ZOX:[id number] DOB: Jun 30, 1956 DOA: 04/06/2015 PCP: [name], MD  Admit HPI / Brief Narrative: 59 y.o.WM PMHx PUD, HLD, history of MRSA abscesses, peripheral neuropathy, and chronic pain   Presented to [facility] with severe left hand pain. He was found to be septic with acute kidney injury and left hand cellulitis. After admission he was noted to have a dusky swollen right foot. The patient reported that approximately 2 weeks prior he started feeling poorly and eating less. 9/7 he could not feel his right foot and fell as a result. He landed on his left wrist, shoulder, and back side. Since then his left hand has been very painful and his wrist and arm had begun to swell. He has developed thick erythematous streaking on his left arm.    In the ER: Lactic acid was 4.16, INR is 2.26 (not on anticoag), PTT is 24.7, WBC is 31.3, mag 1.3, potassium 2.7, procalcitonin 9.72. BP was 80/68 He received 3L of IV bolus in the ER. 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 Reportedly the patient injected his groin with testosterone 3 weeks ago.  HPI/Subjective: 9/27 afebrile overnight, A/O 4, S/P Rt thigh debridement, and Plastic surgery intervention with wound VAC changed today. Negative CP, negative SOB. Increased right thigh pain (just returned from a war). Loss of sensation first 3 metatarsals RLE.     Assessment/Plan: Severe sepsis - MSSA Bacteremia  -Multiple potential sourcesl: Lt hand cellulitis, UTI/pyelonephritis, Rt groin puncture/aneursym, left wrist abscess? -S/P multiple debridement of right thigh/groin; see procedures below -Continue antibiotics per ID; will require Ancef 6 weeks -ID; recommends TEE when stable; would revisit indication for this procedure after patient's multiple surgeries completed.   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 (orthopedic surgery) recommended compression stockings and follow-up as an outpatient. Conservative treatment for now.    RLE DVT -Continue argatroban per surgery  Left hand radius fracture/ cellulitis w/Lt wrist abscess? -Left arm ultrasound nondiagnostic for abscess (preliminary read); patient unable to be positioned properly for LUE CT scan. -[name], hand surgeon is directing care this issue, spoke with [name] who will evaluate patient today and take for surgery if required.  Pulmonary hypertension/diastolic dysfunction -Transfuse for Hb<8 -9/22 transfuse 1 unit PRBC -9/24 transfuse 1 unit PRBC  Acute renal failure(Cr=0.9 at baseline) -improving with aggressive hydration, but not back to baseline.. -Hold any nephrotoxic medications. -D5-0.9% saline 75 ml/hr  Possible pyelonephritis -Creatinine WNL  Elevated INR with coagulopathy -Resolving   Hypokalemia -Potassium goal > 4  -Potassium IV 60 mEq   Hypomagnesemia -Magnesium goal>2 - Magnesium IV 4 gm  Chronic pain -been pain medications for 12 years - managed by Dr. [name]  -Takes high dose scheduled klonopin. Have ordered chronic pain meds as he takes at home with holding parameters.  -Klonopin 0.5 TID PRN.    Code Status: FULL Family Communication: None present at time of exam Disposition Plan: Per surgery    Consultants: Dr. Knute Neu Hand Surgery Dr.Brian Rolena Infante Vascular Surgery  Dr. [name] (ID)   Procedure/Significant Events: 9/15, 9/17, and 9/19 Right thigh abscess I&D, Excision  of infected right superficial femoral artery aneurysm 9/22 bilateral LE Doppler;Right: DVT noted in the FV, Pop v, Gastrocnemius v,  PTV. Superficial thrombosis noted in the LSV. Unable to image CFV due to bandage/wound vac. No Baker's cyst.  -Left: No evidence of DVT/SVT/ Baker's cyst. 9/19 echocardiogram;- LVEF=  55% to 60%.-(grade 1 diastolic dysfunction). - Pulmonary arteries: PApeak pressure: 41 mm Hg (S). 9/22 transfuse 1 unit PRBC 9/24 transfuse 1 unit PRBC 9/24 LUE venous Doppler: Negative for DVT/SVT   Culture 9/10 blood right hand positive MSSA 9/11 blood right forearm positive MSSA 9/11 negative MRSA by PCR 9/11 urine negative 9/12 blood right AC NGTD   Antibiotics: Zosyn 9/10 > stopped 9/13 Vancomycin 9/10 > stopped 9/15 Ancef 9/14>>   Famciclovir PRN  9/11>>   DVT prophylaxis: SCD   Devices    LINES / TUBES:      Continuous Infusions: . argatroban Stopped (04/23/15 1100)  . dextrose 5 % and 0.9% NaCl 10 mL/hr (04/22/15 1848)  . lactated ringers 10 mL/hr at 04/23/15 1133    Objective: VITAL SIGNS: Temp: 98.3 F (36.8 C) (09/27 1509) Temp Source: Oral (09/27 1509) BP: 132/76 mmHg (09/27 1509) Pulse Rate: 81 (09/27 1509) SPO2; FIO2:   Intake/Output Summary (Last 24 hours) at 04/23/15 1559 Last data filed at 04/23/15 1530  Gross per 24 hour  Intake 1986.2 ml  Output   1479 ml  Net  507.2 ml     Exam: General: A/O  4, No acute respiratory distress, significant pain secondary to multiple surgeries and possible left wrist abscess (just returned from [place]) Eyes: Negative headache, negative scleral hemorrhage ENT: Negative Runny nose, negative ear pain, negative gingival bleeding, Neck:  Negative scars, masses, torticollis, lymphadenopathy, JVD Lungs: Clear to auscultation bilaterally without wheezes or crackles Cardiovascular: Tachycardic, Regular rhythm without murmur gallop or rub normal S1 and S2 Abdomen:negative abdominal pain, nondistended, positive soft bowel sounds, no rebound, no ascites, no appreciable mass, midline incision covered and clean with abdominal  binder on. Extremities: left forearm/wrist/hand decreased swelling, negative warmth to the touch, area on radial area., right thigh/inguinal area swollen, erythematous, warm to touch, surgical area wound VAC in place. Right foot first and second and third metatarsal bluish in color, (increasing) , cool to touch, loss of sensation in first/second/third metatarsal, remainder of foot and leg 3-4+ edema. painful to the touch. Psychiatric:  Negative depression, negative anxiety, negative fatigue, negative mania  Neurologic:  Cranial nerves II through XII intact, tongue/uvula midline, all extremities muscle strength 5/5, sensation intact throughout,  negative dysarthria, negative expressive aphasia, negative receptive aphasia.   Data Reviewed: Basic Metabolic Panel:  Recent Labs Lab 04/19/15 0405 04/20/15 0459 04/21/15 0520 04/22/15 0532 04/23/15 0420  NA 136 138 136 138 136  K 3.1* 3.3* 3.4* 3.3* 3.4*  CL 102 101 99* 102 99*  CO2 GLUCOSE 97 137* 110* 103* 98  BUN CREATININE 1.06 1.11 1.00 0.93 0.97  CALCIUM 7.6* 7.6* 7.5* 7.7* 7.6*  MG  --   --  1.5* 1.8 1.6*   Liver Function Tests:  Recent Labs Lab 04/19/15 0405 04/20/15 0459 04/21/15 0520 04/22/15 0532 04/23/15 0420  AST 36 38 33 55* 60*  ALT 13* 11* 10* 17 20  ALKPHOS 188* 171* 132* 136* 156*  BILITOT 0.9 0.6 0.5 0.6 0.9  PROT 6.0* 6.2* 6.0* 6.3* 6.3*  ALBUMIN 1.3* 1.3* 1.4* 1.4* 1.5*   No results for input(s): LIPASE, AMYLASE  in the last 168 hours. No results for input(s): AMMONIA in the last 168 hours. CBC:  Recent Labs Lab 04/19/15 0405 04/20/15 0459 04/21/15 0520 04/22/15 0532 04/23/15 0420  WBC 9.5 10.0 10.1 8.2 8.3  NEUTROABS 7.1 7.7 7.8* 6.0 5.6  HGB 8.0* 7.6* 8.1* 7.6* 8.9*  HCT 25.0* 23.9* 25.9* 24.9* 28.1*  MCV 87.4 87.9 87.5 87.4 87.3  PLT 544* 492* 434* 440* 409*   Cardiac Enzymes: No results for input(s): CKTOTAL, CKMB, CKMBINDEX, TROPONINI in the last 168 hours. BNP  (last 3 results) No results for input(s): BNP in the last 8760 hours.  ProBNP (last 3 results) No results for input(s): PROBNP in the last 8760 hours.  CBG:  Recent Labs Lab 04/19/15 1530  GLUCAP 87    No results found for this or any previous visit (from the past 240 hour(s)).   Studies:  Recent x-ray studies have been reviewed in detail by the Attending Physician  Scheduled Meds:  Scheduled Meds: .  ceFAZolin (ANCEF) IV  2 g Intravenous 3 times per day  . docusate sodium  100 mg Oral BID  . feeding supplement (ENSURE ENLIVE)  237 mL Oral TID BM  . metoprolol tartrate  25 mg Oral BID  . morphine  90 mg Oral 2 times per day  . morphine   Intravenous 6 times per day  . naloxegol oxalate  25 mg Oral Daily  . potassium chloride  60 mEq Oral Daily  . sodium chloride  3 mL Intravenous Q12H  . venlafaxine XR  75 mg Oral Q breakfast    Time spent on care of this patient: 40 mins   WOODS, Roselind Messier , MD  [facility] Office  [phone] Pager [phone]  On-Call/Text Page:      [email or web address]      password [email or web address]  If 7PM-7AM, please contact night-coverage [email or web address] Password [email or web address] 04/23/2015, 3:59 PM   LOS: 16 days   Care during the described time interval was provided by me .  I have reviewed this patient's available data, including medical history, events of note, physical examination, and all test results as part of my evaluation. I have personally reviewed and interpreted all radiology studies.   Carolyne Littles, MD [phone] Pager

## 2015-04-23 NOTE — H&P (View-Only) (Signed)
POD# 3 rectus flap to right groin/thigh, over sewing femoral vein  Temp:  [98.5 F (36.9 C)-99.1 F (37.3 C)] 98.7 F (37.1 C) (09/26 0349) Pulse Rate:  [92-99] 99 (09/26 0349) Resp:  [9-20] 12 (09/26 0409) BP: (121-136)/(76-83) 136/83 mmHg (09/26 0349) SpO2:  [96 %-100 %] 100 % (09/26 0409)   JP 10 VAC 25  Notes drainage on linen  PE Abdomen dressing and  Right groin dressing dry, changed Right groin dressing and incision intact here Overall RLE edema less of leg and distal thigh, some blistering beneath VAC , improved erythema VAC with watery drainage in cannister Linens saturated  A/P S/p rectus muscle flap to prior abscess cavity over stumps of vessels  VAC change tomorrow- d/w patient will need to abduct thigh and place pressure on scrotum to change, patient does not think he can do this awake and will plan under sedation. Discussed if needed will place drain thigh if any deeper fluid pockets Ok to keep Argatroban running for this, ok to switch to Lovenox from my standpoint.  NPO p MN.  Per notes, consult Dr. Duda for evaluation foot todat  Dry dressing to abdomen and right groin every other day and PRN  Abdomen SQ drain l- d/c today  Brinda Thimmappa, MD MBA Plastic & Reconstructive Surgery [phone]  

## 2015-04-23 NOTE — Transfer of Care (Signed)
Immediate Anesthesia Transfer of Care Note  Patient: [name]  Procedure(s) Performed: Procedure(s): EXAM UNDER ANESTHESIA AND VAC CHANGE RIGHT THIGH (Right)  Patient Location: PACU  Anesthesia Type:General  Level of Consciousness: awake, alert , oriented and patient cooperative  Airway & Oxygen Therapy: Patient Spontanous Breathing and Patient connected to face mask oxygen  Post-op Assessment: Report given to RN, Post -op Vital signs reviewed and stable and Patient moving all extremities X 4  Post vital signs: Reviewed and stable  Last Vitals:  Filed Vitals:   04/23/15 1035  BP: 128/74  Pulse: 80  Temp:   Resp: 18    Complications: No apparent anesthesia complications

## 2015-04-23 NOTE — Progress Notes (Signed)
Nutrition Follow-up  DOCUMENTATION CODES:   Not applicable  INTERVENTION:    Ensure Enlive po TID, each supplement provides 350 kcal and 20 grams of protein  NUTRITION DIAGNOSIS:   Inadequate oral intake related to poor appetite as evidenced by per patient/family report.  Ongoing  GOAL:   Patient will meet greater than or equal to 90% of their needs  Progressing  MONITOR:   PO intake, Supplement acceptance, Labs, Weight trends   ASSESSMENT:   59 year old gentleman history of peptic ulcer disease, history of MRSA abscesses, hyperlipidemia, peripheral neuropathy, chronic pain who presented to the ED with severe left hand and upper extremity pain. Patient was seen in the ED and noted to be septic with heart rate as high as 152 rest her rate of 28 initial blood pressure was 80/68.  Patient currently in OR for wound VAC change and drainage of post operative seroma. Patient is receiving a regular diet with Ensure Enlive TID. He has been consuming 75-100% of meals. Patient with increased protein and calorie needs to support wound healing.   Diet Order:  Diet regular Room service appropriate?: Yes; Fluid consistency:: Thin  Skin:  Reviewed, no issues  Last BM:  9/21  Height:   Ht Readings from Last 1 Encounters:  04/06/15  (1.905 m)    Weight:   Wt Readings from Last 1 Encounters:  04/06/15 228 lb (103.42 kg)    Ideal Body Weight:  89.1 kg  BMI:  Body mass index is 28.5 kg/(m^2).  Estimated Nutritional Needs:   Kcal:  2300-2500  Protein:  110-130 gm  Fluid:  2.3-2.5 L  EDUCATION NEEDS:   No education needs identified at this time  Joaquin Courts, RD, LDN, CNSC Pager [phone] After Hours Pager [phone]

## 2015-04-23 NOTE — Anesthesia Procedure Notes (Signed)
Procedure Name: LMA Insertion Date/Time: 04/23/2015 12:12 PM Performed by: Bishop Limbo Pre-anesthesia Checklist: Patient identified, Emergency Drugs available, Suction available, Patient being monitored and Timeout performed Patient Re-evaluated:Patient Re-evaluated prior to inductionOxygen Delivery Method: Circle system utilized Preoxygenation: Pre-oxygenation with 100% oxygen Intubation Type: IV induction Ventilation: Mask ventilation without difficulty LMA: LMA inserted LMA Size: 5.0 Number of attempts: 1 Placement Confirmation: breath sounds checked- equal and bilateral,  positive ETCO2 and CO2 detector Secured at: 22 cm Tube secured with: Tape Dental Injury: Teeth and Oropharynx as per pre-operative assessment

## 2015-04-23 NOTE — Op Note (Signed)
Operative Note   DATE OF OPERATION: 9.27.2016  LOCATION: [facility]- inpatient  SURGICAL DIVISION: Plastic Surgery  PREOPERATIVE DIAGNOSES:  1. History mycotic aneurysm right superficial femoral artery, s/p resection 2. History oversewing rightfemoral vein 3. Right lower extremity DVT   POSTOPERATIVE DIAGNOSES:  Same 4. Seroma right thigh post operative  PROCEDURE:  1. Wound VAC change 2. Drainage post operative seroma with placement drain  SURGEON: Glenna Fellows MD MBA  ASSISTANT: none  ANESTHESIA:  General.   EBL:10 ml  COMPLICATIONS: None immediate.   INDICATIONS FOR PROCEDURE:  The patient, [name], is a 59 y.o. male born on 06-Mar-1956, is here for [facility] change following recent rectus abdominus muscle flap to right groin following for history mycotic aneurysm superficial femoral artery, thrombosis and rupture femoral vein.   FINDINGS: Approximately 50-100 cc seroma fluid expressed medial thigh right. Viable muscle flap. No grossly exposed vascular structures.   DESCRIPTION OF PROCEDURE:  The patient's operative site was marked with the patient in the preoperative area. The patient was taken to the operating room. Patient is on therapeutic antibiotics and no additional antibiotics were given. The patient's operative site was prepped and draped in a sterile fashion. A time out was performed and all information was confirmed to be correct.  Rectus muscle noted in right proximal thigh wound, viable, no granulation tissue over this. Right groin incision intact dry. Adjacent skin flaps to open wound adherent to muscle. With aid of Yankauer suction, elevated skin and soft tissue flap medially over muscle flap. Able to express approximately 50- 100 ml seroma fluid medial and beneath muscle flap. I elected to place additional drain this this space. 15 Fr JP placed in medial thigh and secured to skin with 2-0 nylon.  Quilting sutures 2-0 PDS used to tack elevated soft tissue over medial  thigh back down to muscle.  VAC black sponge applied to open wound, measured as 6 x 2.5 x 1 cm and set to 125 mm Hg continuous suction. Dry dressing applied to abdomen and right groin incisions. The patient was allowed to wake from anesthesia, extubated and taken to the recovery room in satisfactory condition.   SPECIMENS: none  DRAINS: 15 Fr JP in right thigh  Glenna Fellows, MD [facility] Plastic & Reconstructive Surgery [phone]

## 2015-04-23 NOTE — Progress Notes (Addendum)
[facility]  Subjective  - OR today with Dr. Freda Munro for wound vac change.     Objective 125/84 82 98.5 F (36.9 C) (Oral) 17 100%  Intake/Output Summary (Last 24 hours) at 04/23/15 0726 Last data filed at 04/23/15 0500  Gross per 24 hour  Intake 1486.7 ml  Output   1715 ml  Net -228.3 ml    Right foot toe and ankle active range of motion intact sensation intact, but decreased. Right foot grossly unchanged with great toe, heel and tips of second toe ischemic changes   Assessment/Planning: POD #4 s/p L FV ligation due to rupture, L rectus flap to R groin POD #12 s/p Excision of ruptured R mycotic SFA, I&D right thigh abscess S/p multiple washout R thigh, VAC placement RLE DVT  Return to OR today [name] conservative management with compression hose.  Clinton Gall[facility] 04/23/2015 7:26 AM --  Laboratory Lab Results:  Recent Labs  04/22/15 0532 04/23/15 0420  WBC 8.2 8.3  HGB 7.6* 8.9*  HCT 24.9* 28.1*  PLT 440* 409*   BMET  Recent Labs  04/22/15 0532 04/23/15 0420  NA 138 136  K 3.3* 3.4*  CL 102 99*  CO2 30 29  GLUCOSE 103* 98  BUN 7 8  CREATININE 0.93 0.97  CALCIUM 7.7* 7.6*    COAG Lab Results  Component Value Date   INR 1.44 04/12/2015   INR 1.39 04/09/2015   INR 1.61* 04/08/2015   No results found for: PTT    Addendum  I have independently interviewed and examined the patient, and I agree with the physician assistant's findings.  To OR for [facility] chg today  Leonides Sake, MD [facility] [place] Office: [phone] Pager: [phone]  04/23/2015, 10:48 AM

## 2015-04-23 NOTE — Progress Notes (Addendum)
UR COMPLETED  

## 2015-04-23 NOTE — Anesthesia Postprocedure Evaluation (Signed)
Anesthesia Post Note  Patient: [name]  Procedure(s) Performed: Procedure(s) (LRB): EXAM UNDER ANESTHESIA AND VAC CHANGE RIGHT THIGH (Right)  Anesthesia type: General  Patient location: PACU  Post pain: Pain level controlled and Adequate analgesia  Post assessment: Post-op Vital signs reviewed, Patient's Cardiovascular Status Stable, Respiratory Function Stable, Patent Airway and Pain level controlled  Last Vitals:  Filed Vitals:   04/23/15 1258  BP:   Pulse:   Temp: 36.7 C  Resp:     Post vital signs: Reviewed and stable  Level of consciousness: awake, alert  and oriented  Complications: No apparent anesthesia complications

## 2015-04-23 NOTE — Consult Note (Signed)
S: Asked to see patient for ? Abscess dorsal L hand, pt unclear of when area became red  O:Blood pressure 132/76, pulse 81, temperature 98.3 F (36.8 C), temperature source Oral, resp. rate 13, height  (1.905 m), weight 103.42 kg (228 lb), SpO2 95 %.  L hand with what appears to be superficial abscess overlying ulnar prominence dorsally, with some surrounding erythema and skin flaking.  Hand is still quite swollen, pt shows signs of finger contracture from immobility/ swelling, no other area seems to be involved.  A: Trauma L hand, new abscess L hand    P: discussed I&D of abscess with patient, agreed to drain at bedside, consent obtained; pt needs OT to work with rom of hand to prevent perm stiffness.

## 2015-04-23 NOTE — Interval H&P Note (Signed)
History and Physical Interval Note:  04/23/2015 11:26 AM  [name]  has presented today for surgery, with the diagnosis of OPEN WOUND RIGHT THIGH  The various methods of treatment have been discussed with the patient and family. After consideration of risks, benefits and other options for treatment, the patient has consented to  Procedure(s): EXAM UNDER ANESTHESIA AND VAC CHANGE RIGHT THIGH (Right) as a surgical intervention .  The patient's history has been reviewed, patient examined, no change in status, stable for surgery.  I have reviewed the patient's chart and labs.  Questions were answered to the patient's satisfaction.     THIMMAPPA, BRINDA

## 2015-04-23 NOTE — Progress Notes (Signed)
ANTICOAGULATION CONSULT NOTE - Follow Up Consult  Pharmacy Consult for Argatroban Indication: Thromboembolism to Right Foot  Allergies  Allergen Reactions  . Imitrex [Sumatriptan] Other (See Comments)    Chest pain, 2004 (tolerates 50 mg prn)  . Verapamil Other (See Comments)    Causes pvc's    Patient Measurements: Height:  (190.5 cm) Weight: 228 lb (103.42 kg) IBW/kg (Calculated) : 84.5  Vital Signs: Temp: 98.8 F (37.1 C) (09/27 0800) Temp Source: Oral (09/27 0800) BP: 125/84 mmHg (09/27 0400) Pulse Rate: 82 (09/27 0400)  Labs:  Recent Labs  04/21/15 0520 04/22/15 0532 04/23/15 0420 04/23/15 0810  HGB 8.1* 7.6* 8.9*  --   HCT 25.9* 24.9* 28.1*  --   PLT 434* 440* 409*  --   APTT 77* 74*  --  68*  CREATININE 1.00 0.93 0.97  --     Estimated Creatinine Clearance: 109.5 mL/min (by C-G formula based on Cr of 0.97).   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 Pharmacy consulted for anticoagulation for right foot thromboembolism secondary to R groin aneurysm. He is s/p I&D 9/15, 9/19, and 9/21 and surgery on 9/23. Heparin levels remained undetectable on 3800 units/hr (hard max of 3999 units/hr), so Argatroban started on 04/15/14 per discussion with vascular surgery (do not want Lovenox at this point due to impending surgeries).  aPTT is therapeutic at 68 sec on 0.47 mcg/kg/min. No bleeding noted, Hb low stable, platelets are normal.  Goal of Therapy:  PTT = 50 to 90 seconds Monitor platelets by anticoagulation protocol: Yes   Plan:   -Continue argatroban at 0.47 mcg/kg/min -CBC and aPTT at least once daily -Monitor for s/sx of bleeding -Consider switching to Lovenox when surgeries are complete  [facility], [place].D., BCPS Clinical Pharmacist Pager: [phone] 04/23/2015 10:08 AM

## 2015-04-23 NOTE — Brief Op Note (Signed)
04/06/2015 - 04/23/2015  5:42 PM  PATIENT:  [name]  59 y.o. male  PRE-OPERATIVE DIAGNOSIS: Abscess L Dorsal Hand  POST-OPERATIVE DIAGNOSIS:  Same  PROCEDURE:  Incision and Drainage of L Hand Abscess  SURGEON:  Surgeon(s) and Role: Coley  ANESTHESIA:   local  FINDINGS: Some purulent material evacuated, wound irrigated, and packed with 2x2 gauze; Pt tolerated procedure well.

## 2015-04-24 ENCOUNTER — Encounter (HOSPITAL_COMMUNITY): Payer: Self-pay | Admitting: Plastic Surgery

## 2015-04-24 LAB — CBC WITH DIFFERENTIAL/PLATELET
BASOS ABS: 0 10*3/uL (ref 0.0–0.1)
BASOS PCT: 0 %
EOS ABS: 0.2 10*3/uL (ref 0.0–0.7)
EOS PCT: 3 %
HCT: 27.9 % — ABNORMAL LOW (ref 39.0–52.0)
Hemoglobin: 8.5 g/dL — ABNORMAL LOW (ref 13.0–17.0)
Lymphocytes Relative: 15 %
Lymphs Abs: 1.2 10*3/uL (ref 0.7–4.0)
MCH: 26.8 pg (ref 26.0–34.0)
MCHC: 30.5 g/dL (ref 30.0–36.0)
MCV: 88 fL (ref 78.0–100.0)
Monocytes Absolute: 1.1 10*3/uL — ABNORMAL HIGH (ref 0.1–1.0)
Monocytes Relative: 14 %
Neutro Abs: 5.5 10*3/uL (ref 1.7–7.7)
Neutrophils Relative %: 68 %
PLATELETS: 414 10*3/uL — AB (ref 150–400)
RBC: 3.17 MIL/uL — AB (ref 4.22–5.81)
RDW: 16.2 % — ABNORMAL HIGH (ref 11.5–15.5)
WBC: 7.9 10*3/uL (ref 4.0–10.5)

## 2015-04-24 LAB — PROTIME-INR
INR: 1.46 (ref 0.00–1.49)
PROTHROMBIN TIME: 17.8 s — AB (ref 11.6–15.2)

## 2015-04-24 LAB — APTT: aPTT: 68 seconds — ABNORMAL HIGH (ref 24–37)

## 2015-04-24 LAB — MAGNESIUM: MAGNESIUM: 2 mg/dL (ref 1.7–2.4)

## 2015-04-24 MED ORDER — WARFARIN SODIUM 5 MG PO TABS
5.0000 mg | ORAL_TABLET | ORAL | Status: AC
  Administered 2015-04-24: 5 mg via ORAL
  Filled 2015-04-24: qty 1

## 2015-04-24 MED ORDER — METOPROLOL TARTRATE 1 MG/ML IV SOLN
INTRAVENOUS | Status: AC
  Filled 2015-04-24: qty 5

## 2015-04-24 MED ORDER — METOPROLOL TARTRATE 1 MG/ML IV SOLN: 10.0000 mg | Freq: Once | INTRAVENOUS | Status: DC

## 2015-04-24 MED ORDER — WARFARIN - PHARMACIST DOSING INPATIENT
Freq: Every day | Status: DC
  Administered 2015-04-25: 18:00:00

## 2015-04-24 MED ORDER — ENOXAPARIN SODIUM 120 MG/0.8ML ~~LOC~~ SOLN
1.0000 mg/kg | Freq: Two times a day (BID) | SUBCUTANEOUS | Status: DC
  Administered 2015-04-24 – 2015-04-29 (×12): 105 mg via SUBCUTANEOUS
  Filled 2015-04-24 (×17): qty 0.8

## 2015-04-24 NOTE — Progress Notes (Signed)
ANTICOAGULATION CONSULT NOTE  Pharmacy Consult for Warfarin Indication: R foot thromboembolism  Allergies  Allergen Reactions  . Imitrex [Sumatriptan] Other (See Comments)    Chest pain, 2004 (tolerates 50 mg prn)  . Verapamil Other (See Comments)    Causes pvc's    Patient Measurements: Height:  (190.5 cm) Weight: 228 lb (103.42 kg) IBW/kg (Calculated) : 84.5 Heparin Dosing Weight:   Vital Signs: Temp: 98.9 F (37.2 C) (09/28 1633) Temp Source: Oral (09/28 1633) BP: 130/82 mmHg (09/28 1235) Pulse Rate: 88 (09/28 1235)  Labs:  Recent Labs  04/22/15 0532 04/23/15 0420 04/23/15 0810 04/24/15 0647  HGB 7.6* 8.9*  --  8.5*  HCT 24.9* 28.1*  --  27.9*  PLT 440* 409*  --  414*  APTT 74*  --  68* 68*  CREATININE 0.93 0.97  --   --     Estimated Creatinine Clearance: 109.5 mL/min (by C-G formula based on Cr of 0.97).   Medications:  Prescriptions prior to admission  Medication Sig Dispense Refill Last Dose  . albuterol (PROAIR HFA) 108 (90 BASE) MCG/ACT inhaler Inhale 1 puff into the lungs every 6 (six) hours as needed for wheezing or shortness of breath.    04/06/2015 at Unknown time  . atenolol (TENORMIN) 100 MG tablet Take 100 mg by mouth daily.   2-3 days ago at usually around 6am  . atorvastatin (LIPITOR) 80 MG tablet Take 80 mg by mouth daily.   few days ago  . clonazePAM (KLONOPIN) 2 MG tablet Take 2 mg by mouth 4 (four) times daily.   04/07/2015 at 1400  . Diclofenac-Misoprostol 75-0.2 MG TBEC Take 1 tablet by mouth 2 (two) times daily. Arthrotec  0 04/06/2015 at pm  . famciclovir (FAMVIR) 500 MG tablet Take 500 mg by mouth 2 (two) times daily as needed (fever blisters).    1-2 weeks ago  . ibuprofen (ADVIL,MOTRIN) 800 MG tablet Take 1 tablet (800 mg total) by mouth 3 (three) times daily. (Patient taking differently: Take 800 mg by mouth 2 (two) times daily as needed (pain). ) 21 tablet 0 3 weeks ago  . morphine (MS CONTIN) 30 MG 12 hr tablet Take 30 mg by  mouth every 12 (twelve) hours. Take with a 60 mg tablet for a 90 mg dose  0 04/06/2015 at 900  . morphine (MS CONTIN) 60 MG 12 hr tablet Take 60 mg by mouth every 12 (twelve) hours. Take with a 30 mg tablet for a 90 mg dose  0 04/06/2015 at 900  . naloxegol oxalate (MOVANTIK) 25 MG TABS tablet Take 25 mg by mouth 2 (two) times daily.   04/05/2015  . ondansetron (ZOFRAN) 8 MG tablet Take 8 mg by mouth daily as needed for nausea or vomiting.   0 04/06/2015 at Unknown time  . OVER THE COUNTER MEDICATION Place 1 drop into both eyes 2 (two) times daily as needed (dry eyes). OTC lubricant eye drops   2-3 weeks ago  . oxycodone (ROXICODONE) 30 MG immediate release tablet Take 30 mg by mouth every 6 (six) hours. scheduled   04/06/2015 at pm  . SUMAtriptan (IMITREX) 50 MG tablet Take 50 mg by mouth daily as needed for migraine or headache. Maximum 2 doses in 2 days  0 couple weeks ago  . testosterone cypionate (DEPOTESTOSTERONE CYPIONATE) 200 MG/ML injection Inject 400 mg into the muscle once a week. On Wednesday  0 04/03/2015  . trifluridine (VIROPTIC) 1 % ophthalmic solution Place 1 drop into  both eyes 2 (two) times daily as needed (breakouts (blurry, matted eyelids)).   3 weeks ago  . venlafaxine XR (EFFEXOR-XR) 37.5 MG 24 hr capsule Take 37.5 mg by mouth daily with breakfast.   04/04/2015  . colchicine 0.6 MG tablet Take 1 tablet (0.6 mg total) by mouth 2 (two) times daily. (Patient not taking: Reported on 04/07/2015) 14 tablet 0 Not Taking at Unknown time  . oxyCODONE-acetaminophen (PERCOCET/ROXICET) 5-325 MG per tablet Take 1 tablet by mouth every 6 (six) hours as needed for severe pain. (Patient not taking: Reported on 04/07/2015) 10 tablet 0 Not Taking at Unknown time   Scheduled:  .  ceFAZolin (ANCEF) IV  2 g Intravenous 3 times per day  . docusate sodium  100 mg Oral BID  . enoxaparin (LOVENOX) injection  1 mg/kg Subcutaneous Q12H  . feeding supplement (ENSURE ENLIVE)  237 mL Oral TID BM  . metoprolol  tartrate  25 mg Oral BID  . morphine  90 mg Oral 2 times per day  . morphine   Intravenous 6 times per day  . naloxegol oxalate  25 mg Oral Daily  . sodium chloride  3 mL Intravenous Q12H  . venlafaxine XR  75 mg Oral Q breakfast   Infusions:  . dextrose 5 % and 0.9% NaCl 10 mL/hr at 04/23/15 1509    Assessment: 58yo male with long hospital course of heparin followed by argatroban and now lovenox for R foot thromboembolism. Pharmacy is consulted to dose warfarin for VTE treatment. Hgb 8.5, Plt 414, sCr 0.97 and INR 1.46.  Goal of Therapy:  INR 2-3 Monitor platelets by anticoagulation protocol: Yes   Plan:  Warfarin  tonight x1 Continue lovenox /kg subcutaneously q12h Daily INR/CBC Monitor s/sx of bleeding Educate patient on warfarin  Arlean Hopping. Newman Pies, PharmD Clinical Pharmacist Pager [phone] 04/24/2015,5:09 PM

## 2015-04-24 NOTE — Progress Notes (Signed)
S:hand feels better. Changing dressing as indicated, OT worked with hand  O:Blood pressure 130/82, pulse 88, temperature 98.9 F (37.2 C), temperature source Oral, resp. rate 15, height  (1.905 m), weight 103.42 kg (228 lb), SpO2 100 %.  Dressing changed, no purulent drainage, hand edema better, decreased erythema  A:s/p I&D L hand    P: cont wound care, OT.

## 2015-04-24 NOTE — Evaluation (Signed)
Physical Therapy Evaluation Patient Details Name: [name] MRN: [id number] DOB: 05-07-1956 Today's Date: 04/24/2015   History of Present Illness  [name] is a 60 y.o. male who presents to the Emergency Department complaining of gradually worsening, constant, 8/10 pain to left hand s/p fall that occurred 3.5 days ago.   MRI showed L distal radius fx.  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  Sepsis has been the working dx and pt has been to OR x3 for I and D and revision of thigh wound and most recently for a rectus abdominus muscle flap to right groin on 9/23.  Management of L hand and R thigh wounds continues.  Clinical Impression  Pt admitted with/for worsening L hand pain, but is treated for sepsis due to problems above.  Pt currently limited functionally due to the problems listed. ( See problems list.)   Pt will benefit from PT to maximize function and safety in order to get ready for next venue listed below.     Follow Up Recommendations CIR (unless progressing well)    Equipment Recommendations  None recommended by PT    Recommendations for Other Services       Precautions / Restrictions Precautions Precautions: Fall Restrictions Weight Bearing Restrictions: No      Mobility  Bed Mobility Overal bed mobility: +2 for physical assistance;Needs Assistance Bed Mobility: Supine to Sit;Sit to Supine     Supine to sit: Mod assist;+2 for physical assistance Sit to supine: Mod assist;+2 for physical assistance   General bed mobility comments: used pad to pivot to EOB and 2 person assis to sit straight up.  Transfers                 General transfer comment: not attempted today  Ambulation/Gait                Stairs            Wheelchair Mobility    Modified Rankin (Stroke Patients Only)        Balance Overall balance assessment: Needs assistance Sitting-balance support: No upper extremity supported Sitting balance-Leahy Scale: Fair Sitting balance - Comments: EOB >12 min, LE ROM and reaching tasks during teeth brushing                                     Pertinent Vitals/Pain      Home Living Family/patient expects to be discharged to:: Private residence Living Arrangements: Spouse/significant other;Children (6 yo grandson) Available Help at Discharge: Family;Available 24 hours/day Type of Home: House Home Access: Stairs to enter Entrance Stairs-Rails: Doctor, general practice of Steps: 4 Home Layout: Two level;1/2 bath on main level Home Equipment: Walker - 2 wheels;Cane - single point;Bedside commode      Prior Function Level of Independence: Independent         Comments: pt is on disability, former Therapist, occupational Dominance   Dominant Hand: Right    Extremity/Trunk Assessment   Upper Extremity Assessment: Defer to OT evaluation           Lower Extremity Assessment: RLE deficits/detail;LLE deficits/detail RLE Deficits / Details: Extremely edematous with pitting, Knee AAROM  -20*ext to 80* flexion  Gross  extention 3-/5 LLE Deficits / Details: Generally functional but stiff with -10*  terminal ext of the knee, but grossly >3+/5 strength     Communication   Communication: No difficulties  Cognition Arousal/Alertness: Awake/alert Behavior During Therapy: WFL for tasks assessed/performed Overall Cognitive Status: Within Functional Limits for tasks assessed                      General Comments      Exercises        Assessment/Plan    PT Assessment Patient needs continued PT services  PT Diagnosis Generalized weakness;Acute pain   PT Problem List Decreased strength;Decreased activity tolerance;Decreased mobility;Decreased coordination;Decreased skin integrity;Decreased knowledge of use of  DME;Pain  PT Treatment Interventions DME instruction;Gait training;Functional mobility training;Therapeutic activities;Therapeutic exercise   PT Goals (Current goals can be found in the Care Plan section) Acute Rehab PT Goals Patient Stated Goal: back home independent PT Goal Formulation: With patient Time For Goal Achievement: 05/08/15 Potential to Achieve Goals: Good    Frequency Min 3X/week   Barriers to discharge        Co-evaluation PT/OT/SLP Co-Evaluation/Treatment: Yes Reason for Co-Treatment: Complexity of the patient's impairments (multi-system involvement);For patient/therapist safety           End of Session   Activity Tolerance: Patient tolerated treatment well;Patient limited by pain Patient left: in bed;with call bell/phone within reach Nurse Communication: Mobility status         Time: 1102-1150 PT Time Calculation (min) (ACUTE ONLY): 48 min   Charges:   PT Evaluation $Initial PT Evaluation Tier I: 1 Procedure PT Treatments $Therapeutic Activity: 8-22 mins   PT G Codes:        Mottinger, Eliseo Gum 04/24/2015, 3:56 PM  04/24/2015  Gurley Bing, PT [phone]  (pager)

## 2015-04-24 NOTE — Progress Notes (Signed)
Rehab Admissions Coordinator Note:  Patient was screened by Clois Dupes for appropriateness for an Inpatient Acute Rehab Consult per PT and OT recommendations.  At this time, we are recommending await further progress with therapy before determining rehab needs.  Clois Dupes 04/24/2015, 4:38 PM  I can be reached at [phone].

## 2015-04-24 NOTE — Progress Notes (Addendum)
[facility]  Subjective  - Just waiting for things to get better.   Objective 124/79 87 98.7 F (37.1 C) (Oral) 14 94%  Intake/Output Summary (Last 24 hours) at 04/24/15 0726 Last data filed at 04/24/15 0433  Gross per 24 hour  Intake 1358.2 ml  Output   2024 ml  Net -665.8 ml    Doppler DP right LE Compression hose in place, wound vac and JP darin intact Toes and heel un changed  Assessment/Planning: POD #5 s/p L FV ligation due to rupture, L rectus flap to R groin POD #13 s/p Excision of ruptured R mycotic SFA, I&D right thigh abscess S/p multiple washout R thigh, VAC placement RLE DVT  WBC 7.9 WNL Ancef IV antibiotics q8 JP 169 cc Argatroban per pharmacy   Thomasena Edis [facility] 04/24/2015 7:26 AM --  Laboratory Lab Results:  Recent Labs  04/23/15 0420 04/24/15 0647  WBC 8.3 7.9  HGB 8.9* 8.5*  HCT 28.1* 27.9*  PLT 409* 414*   BMET  Recent Labs  04/22/15 0532 04/23/15 0420  NA 138 136  K 3.3* 3.4*  CL 102 99*  CO2 30 29  GLUCOSE 103* 98  BUN 7 8  CREATININE 0.93 0.97  CALCIUM 7.7* 7.6*    COAG Lab Results  Component Value Date   INR 1.44 04/12/2015   INR 1.39 04/09/2015   INR 1.61* 04/08/2015   No results found for: PTT  Addendum  I have independently interviewed and examined the patient, and I agree with the physician assistant's findings.  Ok with transition to Lovenox to Coumadin for DVT.  Wound care per Plastics at this point.  Pt will follow up with [name] for mgmt of the right foot.  Patient can follow up with me in one month.  He is going to need long-term surveillance.  After 6-12 months minimum of healing, he might be a candidate for revascularization surgery in the right leg if needed.  Leonides Sake, MD [facility] Office: [phone] Pager: [phone]  04/24/2015, 12:45 PM

## 2015-04-24 NOTE — Progress Notes (Addendum)
Wasted 0.8mg  morphine pca with Maggie Chrismon  At ~0112.

## 2015-04-24 NOTE — Progress Notes (Signed)
POD# 5 rectus flap to right groin/thigh, over sewing femoral vein  Temp:  [98 F (36.7 C)-99.7 F (37.6 C)] 98.7 F (37.1 C) (09/28 0431) Pulse Rate:  [80-90] 87 (09/28 0431) Resp:  [12-23] 14 (09/28 0431) BP: (113-132)/(58-87) 124/79 mmHg (09/28 0431) SpO2:  [94 %-100 %] 94 % (09/28 0431)   JP thigh 64 cc VAC 50 cc    Therapeutic on Argatroban S/p I&D abscess left wrist  PE Abdomen dressing and  Right groin dressing dry RLE edema less of leg and distal thigh, lymphedema of leg is manifesting as drainage around drain exit site VAC with watery serosanguinous drainage TED hose in place  A/P S/p rectus muscle flap to prior abscess cavity over stumps of vessels  Plan VAC change at bedside next 04/26/15  No surgeries/interventions planned- Ok to switch to therapeutic Lovenox  Dry dressing to abdomen and right groin every other day and PRN  PT/OT, OOB as tolerated  Glenna Fellows, MD [facility]astic [facility] [phone]

## 2015-04-24 NOTE — Progress Notes (Signed)
[facility] TEAM 1 - Stepdown/ICU TEAM PROGRESS NOTE  [name] EAV:[id number] DOB: 03-21-1956 DOA: 04/06/2015 PCP: [name], MD  Admit HPI / Brief Narrative: 59 y.o. Male with PUD, HLD, history of MRSA abscesses, peripheral neuropathy, and chronic pain who presented to [facility] [place] [facility] with severe left hand pain. He was found to be septic with acute kidney injury and left hand cellulitis. After admission he was noted to have a dusky swollen right foot. The patient reported that approximately 2 weeks prior he started feeling poorly and eating less. 9/7 he could not feel his right foot and fell as a result. He landed on his left wrist, shoulder, and back side. Since then his left hand has been very painful and his wrist and arm had begun to swell. He has developed thick erythematous streaking on his left arm.    In the ER: Lactic acid was 4.16, INR is 2.26 (not on anticoag), PTT is 24.7, WBC is 31.3, mag 1.3, potassium 2.7, procalcitonin 9.72. BP was 80/68 He received 3L of IV bolus in the ER.  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 Reportedly the patient injected his groin with testosterone 3 weeks ago.  HPI/Subjective The patient is in good spirits today.  He states his pain is currently well-controlled.  He feels a swelling in his left hand and right leg have improved.  He denies chest pain nausea vomiting or abdominal pain.   Assessment/Plan:  Severe sepsis - MSSA bacteremia  -suspect his initial site was the R groin injection site  -Patient was hypotensive, tachycardic, with a white count of 31.3 on initial workup = sepsis  -Continue antibiotics as per ID -ID suggest will need TEE when more stable clinically - Cards felt  that TTE would suffice - TTE did not note vegetation, but given serious nature of his illness I feel most comfortable w/ TEE to better evaluate his valves and help determine if a longer course of abx is indicated, or even  valve procedure - will ask Cards to reconsider TEE once pt more stable from his recent surgerys   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 has evaluated the foot and feels watchful waiting is appropriate and wishes to see the patient in follow-up in his office  R femoral vein disruption/DVT - severe R LE / groin edema  In OR 9/23 became clear femoral vein involved in infection, and fully thrombosed - Vasc Surgery oversewed the divided vessel at the time of surgery 9/23 - edema appears to be slowly improving  Acute blood loss anemia Due to infected vascular region s/p multiple surgeries + sepsis - transfuse as needed to keep Hgb 7.0 or > - stable today  Sinus tachycardia > SVT > [facility]  Cardiology has completed an evaluation and recommends adenosine 6 mg should the patient suffer another episode of atrial tachycardia - heart rate stable for now  Acute renal failure Creatinine is 0.9 at baseline - 4.4 on admission - has now normalized   L arm swelling - Left hand cellulitis with radius fracture - history of MRSA abscess Evaluated  by [name] initially, w/ his plan being for the pt to follow-up in his office in approximately 2 weeks - swelling of arm noted 9/23 - venous duplex 9/24 w/o evidence of DVT - Dr. Joseph Art attempted to get Hand Surgery to re-evaluate over the weekend, w/o success - s/p Incision and Drainage of L Hand Abscess 9/27 - ongoing care per Hand Surgery   Possible pyelonephritis Urinalysis suggestive of infection - adequately covered with broad-spectrum empiric antibiotics - urine culture not helpful   Elevated  INR with coagulopathy Felt to be due to sepsis - resolved   Hypokalemia and hypomagnesemia Recurrent - replace as needed   Chronic pain has been on pain medications for 12 years - managed by Dr. [name] [facility]?) - also takes high dose scheduled klonopin - pain currently controlled   Code Status: FULL Family Communication: No family present at time of exam today Disposition Plan: SDU  Consultants: Hand Surgery Vascular Surgery  ID Cardiology - [facility] Plastic surgery Orthopedics - [name]   Antibiotics: Zosyn 9/10 > 9/12 Vancomycin 9/10 > 9/15 Cefazolin 9/14 >  DVT prophylaxis: lovenox   Objective: Blood pressure 130/82, pulse 88, temperature 98.9 F (37.2 C), temperature source Oral, resp. rate 15, height  (1.905 m), weight 103.42 kg (228 lb), SpO2 100 %.  Intake/Output Summary (Last 24 hours) at 04/24/15 1646 Last data filed at 04/24/15 1633  Gross per 24 hour  Intake 1224.5 ml  Output   2580 ml  Net -1355.5 ml   Exam: General: No acute respiratory distress -  alert   Lungs: Clear to auscultation bilaterally w/o wheeze  Cardiovascular:  regular rate and rhythm without murmur Abdomen: Nontender, nondistended, soft, bowel sounds positive, no rebound, no ascites, no appreciable mass Extremities:  1+ edema left upper extremity with L hand dressed/dry, right lower extremity with 2+ edema - multiple areas of nonviable skin across digits right foot without severe surrounding erythema or discharge  Data Reviewed: Basic Metabolic Panel:  Recent Labs Lab 04/19/15 0405 04/20/15 0459 04/21/15 0520 04/22/15 0532 04/23/15 0420 04/24/15 0647  NA 136 138 136 138 136  --   K 3.1* 3.3* 3.4* 3.3* 3.4*  --   CL 102 101 99* 102 99*  --   CO2 --   GLUCOSE 97 137* 110* 103* 98  --   BUN --   CREATININE 1.06 1.11 1.00 0.93 0.97  --   CALCIUM 7.6* 7.6* 7.5* 7.7* 7.6*  --   MG  --   --  1.5* 1.8 1.6* 2.0    CBC:  Recent Labs Lab  04/20/15 0459 04/21/15 0520 04/22/15 0532 04/23/15 0420 04/24/15 0647  WBC 10.0 10.1 8.2 8.3 7.9  NEUTROABS 7.7 7.8* 6.0 5.6 5.5  HGB 7.6* 8.1* 7.6* 8.9* 8.5*  HCT 23.9* 25.9* 24.9* 28.1* 27.9*  MCV 87.9 87.5 87.4 87.3 88.0  PLT 492* 434* 440* 409* 414*    Liver Function Tests:  Recent Labs Lab 04/19/15 0405 04/20/15 0459 04/21/15 0520 04/22/15 0532 04/23/15 0420  AST 36 38 33 55* 60*  ALT 13* 11* 10* 17 20  ALKPHOS 188* 171* 132* 136* 156*  BILITOT 0.9 0.6 0.5 0.6 0.9  PROT 6.0* 6.2* 6.0* 6.3* 6.3*  ALBUMIN 1.3* 1.3* 1.4* 1.4* 1.5*   Coags:  Recent Labs Lab 04/20/15 0459 04/21/15 0520 04/22/15 0532 04/23/15 0810 04/24/15 0647  APTT 58* 77* 74* 68* 68*    Studies:  Recent x-ray studies have been reviewed in detail by the Attending Physician  Scheduled Meds:  Scheduled Meds: .  ceFAZolin (ANCEF) IV  2 g Intravenous 3 times per day  . docusate sodium  100 mg Oral BID  . enoxaparin (LOVENOX) injection  1 mg/kg Subcutaneous Q12H  . feeding supplement (ENSURE ENLIVE)  237 mL Oral TID BM  . metoprolol tartrate  25 mg Oral BID  . morphine  90 mg Oral 2 times per day  . morphine   Intravenous 6 times per day  . naloxegol oxalate  25 mg Oral Daily  . sodium chloride  3 mL Intravenous Q12H  . venlafaxine XR  75 mg Oral Q breakfast    Time spent on care of this patient: 25 mins   [facility] T , MD   [facility] Office  [phone] Pager - Text Page per Loretha Stapler as per below:  On-Call/Text Page:      [email or web address]      password [email or web address]  If 7PM-7AM, please contact night-coverage [email or web address] Password [email or web address] 04/24/2015, 4:46 PM   LOS: 17 days

## 2015-04-24 NOTE — Evaluation (Signed)
Occupational Therapy Evaluation Patient Details Name: JOSHIAH TRAYNHAM MRN: [id number] DOB: 10/16/1955 Today's Date: 04/24/2015    History of Present Illness EH SESAY is a 59 y.o. male who presents to the Emergency Department complaining of gradually worsening, constant, 8/10 pain to left hand s/p fall that occurred 3.5 days ago.   MRI showed L distal radius fx.  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  Sepsis has been the working dx and pt has been to OR x3 for I and D and revision of thigh wound and most recently for a rectus abdominus muscle flap to right groin on 9/23.  Management of L hand and R thigh wounds continues.   Clinical Impression   Pt was independent prior to admission in ADL. OT order is specific for edema management of L UE and ROM of L hand, wrist and fingers. Pt presents with L UE and R LE pain and pitting edema with generalized weakness. He requires +2 assist for bed level mobility and did well considering his complicated medical course, although he was not able to stand today. Performed gentle A/PROM as tolerated to L hand, AROM in supine to L elbow and shoulder and retrograde massage followed by elevation on 3 pillows. Pt is highly motivated to increase his level of independence and return home. Recommending inpatient rehab. Will follow acutely.    Follow Up Recommendations  CIR    Equipment Recommendations  3 in 1 bedside comode    Recommendations for Other Services       Precautions / Restrictions Precautions Precautions: Fall Precaution Comments: no specific weight bearing order but avoided weight bearing through L wrist Restrictions Weight Bearing Restrictions: No      Mobility Bed Mobility Overal bed mobility: +2 for physical assistance;Needs Assistance Bed Mobility: Supine to Sit;Sit to Supine     Supine  to sit: Mod assist;+2 for physical assistance Sit to supine: Mod assist;+2 for physical assistance   General bed mobility comments: used pad to pivot to EOB and 2 person assist to sit up.  Transfers                 General transfer comment: not attempted today    Balance Overall balance assessment: Needs assistance Sitting-balance support: No upper extremity supported Sitting balance-Leahy Scale: Fair Sitting balance - Comments: EOB >12 min, LE ROM and reaching tasks during teeth brushing                                    ADL Overall ADL's : Needs assistance/impaired Eating/Feeding: Set up;Bed level   Grooming: Brushing hair;Oral care;Sitting;Supervision/safety   Upper Body Bathing: Sitting;Moderate assistance   Lower Body Bathing: Total assistance;+2 for physical assistance;Bed level   Upper Body Dressing : Sitting;Moderate assistance Upper Body Dressing Details (indicate cue type and reason): instructed to dress L UE first Lower Body Dressing: +2 for physical assistance;Total assistance;Bed level                       Vision     Perception     Praxis      Pertinent Vitals/Pain Pain Assessment: Faces Faces Pain Scale: Hurts whole lot Pain Location: L UE, R LE Pain Descriptors / Indicators:  Grimacing;Guarding Pain Intervention(s): Limited activity within patient's tolerance;Monitored during session;Repositioned;PCA encouraged     Hand Dominance Right   Extremity/Trunk Assessment Upper Extremity Assessment Upper Extremity Assessment: RUE deficits/detail;LUE deficits/detail RUE Deficits / Details: WFL LUE Deficits / Details: 3-/5 shoulder, 3+/5 elbow, 3/5 grip, pitting edema throughout LUE Coordination: decreased fine motor;decreased gross motor   Lower Extremity Assessment Lower Extremity Assessment: Defer to PT evaluation RLE Deficits / Details: Extremely edematous with pitting, Knee AAROM  -20*ext to 80* flexion  Gross  extention 3-/5 RLE Sensation: history of peripheral neuropathy RLE Coordination: decreased fine motor;decreased gross motor LLE Deficits / Details: Generally functional but stiff with -10*  terminal ext of the knee, but grossly >3+/5 strength LLE Coordination: decreased fine motor       Communication Communication Communication: No difficulties   Cognition Arousal/Alertness: Awake/alert Behavior During Therapy: WFL for tasks assessed/performed Overall Cognitive Status: Within Functional Limits for tasks assessed                     General Comments       Exercises       Shoulder Instructions      Home Living Family/patient expects to be discharged to:: Private residence Living Arrangements: Spouse/significant other;Children (6 y.o. 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, former Chiropractor    OT Diagnosis: Generalized weakness;Acute pain   OT Problem List: Decreased strength;Decreased activity tolerance;Impaired balance (sitting and/or standing);Decreased coordination;Decreased knowledge of use of DME or AE;Pain;Impaired UE functional use;Increased edema   OT Treatment/Interventions: Self-care/ADL training;Therapeutic exercise;Therapeutic activities;Patient/family education;Balance training;DME and/or AE instruction    OT Goals(Current goals can be found in the care plan section) Acute Rehab OT Goals Patient Stated Goal: back home independent OT Goal Formulation: With patient Time For  Goal Achievement: 05/08/15 Potential to Achieve Goals: Good ADL Goals Pt Will Perform Upper Body Bathing: with min assist;sitting Pt Will Perform Upper Body Dressing: with min assist;sitting Pt Will Transfer to Toilet: with mod assist;stand pivot transfer;bedside commode Pt/caregiver will Perform Home Exercise Program: Left upper extremity;Increased ROM;Increased strength;With Supervision Additional ADL Goal #1: Pt/Caregiver will be independent in edema management techniques L UE. Additional ADL Goal #2: Pt will perform bed mobility with moderate assistance as a precursor to ADL.  OT Frequency: Min 3X/week   Barriers to D/C:            Co-evaluation PT/OT/SLP Co-Evaluation/Treatment: Yes Reason for Co-Treatment: Complexity of the patient's impairments (multi-system involvement)   OT goals addressed during session: Strengthening/ROM      End of Session Nurse Communication: Mobility status  Activity Tolerance: Patient limited by pain Patient left: in bed;with call bell/phone within reach   Time: 1103-1150 OT Time Calculation (min): 47 min Charges:  OT General Charges $OT Visit: 1 Procedure OT Evaluation $Initial OT Evaluation Tier I: 1 Procedure G-Codes:    Evern Bio 04/24/2015, 4:23 PM  [phone]

## 2015-04-25 DIAGNOSIS — L02512 Cutaneous abscess of left hand: Secondary | ICD-10-CM | POA: Diagnosis present

## 2015-04-25 DIAGNOSIS — S52501A Unspecified fracture of the lower end of right radius, initial encounter for closed fracture: Secondary | ICD-10-CM

## 2015-04-25 LAB — COMPREHENSIVE METABOLIC PANEL
ALT: 13 U/L — ABNORMAL LOW (ref 17–63)
ANION GAP: 8 (ref 5–15)
AST: 36 U/L (ref 15–41)
Albumin: 1.5 g/dL — ABNORMAL LOW (ref 3.5–5.0)
Alkaline Phosphatase: 133 U/L — ABNORMAL HIGH (ref 38–126)
BUN: 8 mg/dL (ref 6–20)
CHLORIDE: 98 mmol/L — AB (ref 101–111)
CO2: 29 mmol/L (ref 22–32)
CREATININE: 0.94 mg/dL (ref 0.61–1.24)
Calcium: 7.7 mg/dL — ABNORMAL LOW (ref 8.9–10.3)
Glucose, Bld: 97 mg/dL (ref 65–99)
POTASSIUM: 3.6 mmol/L (ref 3.5–5.1)
SODIUM: 135 mmol/L (ref 135–145)
Total Bilirubin: 0.5 mg/dL (ref 0.3–1.2)
Total Protein: 6.3 g/dL — ABNORMAL LOW (ref 6.5–8.1)

## 2015-04-25 LAB — CBC WITH DIFFERENTIAL/PLATELET
Basophils Absolute: 0 10*3/uL (ref 0.0–0.1)
Basophils Relative: 0 %
EOS ABS: 0.3 10*3/uL (ref 0.0–0.7)
EOS PCT: 3 %
HCT: 28 % — ABNORMAL LOW (ref 39.0–52.0)
Hemoglobin: 8.7 g/dL — ABNORMAL LOW (ref 13.0–17.0)
LYMPHS ABS: 1.6 10*3/uL (ref 0.7–4.0)
LYMPHS PCT: 17 %
MCH: 27.3 pg (ref 26.0–34.0)
MCHC: 31.1 g/dL (ref 30.0–36.0)
MCV: 87.8 fL (ref 78.0–100.0)
MONO ABS: 1.3 10*3/uL — AB (ref 0.1–1.0)
Monocytes Relative: 14 %
Neutro Abs: 6 10*3/uL (ref 1.7–7.7)
Neutrophils Relative %: 66 %
PLATELETS: 426 10*3/uL — AB (ref 150–400)
RBC: 3.19 MIL/uL — ABNORMAL LOW (ref 4.22–5.81)
RDW: 15.9 % — AB (ref 11.5–15.5)
WBC: 9.2 10*3/uL (ref 4.0–10.5)

## 2015-04-25 LAB — PROTIME-INR
INR: 1.46 (ref 0.00–1.49)
PROTHROMBIN TIME: 17.8 s — AB (ref 11.6–15.2)

## 2015-04-25 MED ORDER — POTASSIUM CHLORIDE 10 MEQ/100ML IV SOLN
10.0000 meq | INTRAVENOUS | Status: AC
  Administered 2015-04-25 (×4): 10 meq via INTRAVENOUS
  Filled 2015-04-25 (×4): qty 100

## 2015-04-25 MED ORDER — WARFARIN SODIUM 5 MG PO TABS
5.0000 mg | ORAL_TABLET | Freq: Once | ORAL | Status: AC
  Administered 2015-04-25: 5 mg via ORAL
  Filled 2015-04-25: qty 1

## 2015-04-25 NOTE — Progress Notes (Signed)
Physical Therapy Treatment Patient Details Name: KOBE OFALLON MRN: [id number] DOB: [birth year] Today's Date: 04/25/2015    History of Present Illness [name] is a 59 y.o. male who presents to the Emergency Department complaining of gradually worsening, constant, 8/10 pain to left hand s/p fall that occurred 3.5 days ago.   MRI showed L distal radius fx.  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  Sepsis has been the working dx and pt has been to OR x3 for I and D and revision of thigh wound and most recently for a rectus abdominus muscle flap to right groin on 9/23.  Management of L hand and R thigh wounds continues.    PT Comments    Progressing well.  Limited by R leg pain and limited knee ROM  Follow Up Recommendations  CIR     Equipment Recommendations  None recommended by PT    Recommendations for Other Services       Precautions / Restrictions Precautions Precautions: Fall Precaution Comments: no specific weight bearing order but avoided weight bearing through L wrist    Mobility  Bed Mobility Overal bed mobility: Needs Assistance Bed Mobility: Supine to Sit     Supine to sit: Min assist;HOB elevated (use of rail)     General bed mobility comments: pt much more aggressive with his assist today.  Transfers Overall transfer level: Needs assistance Equipment used:  (chairback for standing assist) Transfers: Sit to/from [employer] Sit to Stand: Mod assist;+2 physical assistance   Squat pivot transfers: Mod assist     General transfer comment: assist to come forward and initial support at R knee  Ambulation/Gait                 Stairs            Wheelchair Mobility    Modified Rankin (Stroke Patients Only)       Balance Overall balance assessment: Needs  assistance Sitting-balance support: No upper extremity supported Sitting balance-Leahy Scale: Good Sitting balance - Comments: EOB>10 min in betwee standing trials   Standing balance support: Bilateral upper extremity supported Standing balance-Leahy Scale: Poor Standing balance comment: 3 trials of standing with pt progressively attaining more upright posture and straighter R LE                    Cognition Arousal/Alertness: Awake/alert Behavior During Therapy: WFL for tasks assessed/performed Overall Cognitive Status: Within Functional Limits for tasks assessed                      Exercises      General Comments General comments (skin integrity, edema, etc.): Discussed need to work on R knee extention by letting R leg rest only on heel with prolonged stretch by gravity over time.      Pertinent Vitals/Pain Pain Assessment: Faces Faces Pain Scale: Hurts whole lot Pain Location: R LE at knee Pain Descriptors / Indicators: Tightness;Sore;Sharp;Grimacing Pain Intervention(s): Premedicated before session    Home Living                      Prior Function            PT Goals (current goals can now be found in the care plan section) Acute Rehab  PT Goals Patient Stated Goal: back home independent PT Goal Formulation: With patient Time For Goal Achievement: 05/08/15 Potential to Achieve Goals: Good Progress towards PT goals: Progressing toward goals    Frequency  Min 3X/week    PT Plan Current plan remains appropriate    Co-evaluation             End of Session   Activity Tolerance: Patient tolerated treatment well;Patient limited by pain Patient left: in chair;with call bell/phone within reach     Time: 1610-9604 PT Time Calculation (min) (ACUTE ONLY): 34 min  Charges:  $Therapeutic Activity: 23-37 mins                    G Codes:      Mottinger, Eliseo Gum 04/25/2015, 1:43 PM  04/25/2015  [place] Bing,  PT [phone]  (pager)

## 2015-04-25 NOTE — Progress Notes (Signed)
POD# 6 rectus flap to right groin/thigh, over sewing femoral vein  Temp:  [97.6 F (36.4 C)-99 F (37.2 C)] 98.5 F (36.9 C) (09/29 0718) Pulse Rate:  [85-98] 96 (09/29 0718) Resp:  [11-25] 11 (09/29 0800) BP: (120-140)/(65-84) 136/82 mmHg (09/29 0718) SpO2:  [96 %-100 %] 98 % (09/29 0800)   JP thigh 45 cc VAC 100 cc  On Lovenox, warfarin started Worked with PT and OT yesterday  PE Abdomen dressing dry, changed  Right groin dressing , incision dry with few mm eschar RLE edema less of leg and distal thigh, lymphedema of leg is manifesting as drainage around drain exit site VAC with watery serous drainage   A/P S/p rectus muscle flap to prior abscess cavity over stumps of vessels  Plan VAC change at bedside tomorrow. Exam unchanged- the drainage around drain site is due to the edema of his soft tissues due to severe DVT  Can use SCD BLE  Dry dressing to abdomen and right groin every other day and PRN  PT/OT, OOB as tolerated  Glenna Fellows, MD [facility] [phone]

## 2015-04-25 NOTE — Progress Notes (Signed)
Pt assisted back to bed, staff assist x 2 and electric lift.  Right upper thigh dressing saturated with serous drainage.  Dressing changed.  Dressing to Right groin CDI, but loose.  Dressing changed with dry gauze.  Abdominal dressing with small amount of serosanginous drainage lower aspect of abdominal incision.  Dressing changed with dry gauze.  Abdominal binder in position.  SCD returned to LLE.  Pt c/o anxiety; medicated with prn med.

## 2015-04-25 NOTE — Progress Notes (Signed)
[place] TEAM 1 - Stepdown/ICU TEAM Progress Note  [name] UJW:[id number] DOB: 11-20-1955 DOA: 04/06/2015 PCP: [name], MD  Admit HPI / Brief Narrative: 59 y.o.WM PMHx PUD, HLD, history of MRSA abscesses, peripheral neuropathy, and chronic pain   Presented to [facility] [place] with severe left hand pain. He was found to be septic with acute kidney injury and left hand cellulitis. After admission he was noted to have a dusky swollen right foot. The patient reported that approximately 2 weeks prior he started feeling poorly and eating less. 9/7 he could not feel his right foot and fell as a result. He landed on his left wrist, shoulder, and back side. Since then his left hand has been very painful and his wrist and arm had begun to swell. He has developed thick erythematous streaking on his left arm.    In the ER: Lactic acid was 4.16, INR is 2.26 (not on anticoag), PTT is 24.7, WBC is 31.3, mag 1.3, potassium 2.7, procalcitonin 9.72. BP was 80/68 He received 3L of IV bolus in the ER. 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 Reportedly the patient injected his groin with testosterone 3 weeks ago.  HPI/Subjective: 9/29 afebrile overnight, A/O 4, S/P Rt thigh debridement, and Plastic surgery intervention with wound VAC in place. S/P left hand I&D.Negative CP, negative SOB.     Assessment/Plan: Severe sepsis - MSSA Bacteremia  -Multiple potential sourcesl: Lt hand cellulitis, UTI/pyelonephritis, Rt groin puncture/aneursym, left wrist abscess -S/P multiple debridement of right thigh/groin; see procedures below -S/P left hand I&D -Continue antibiotics per ID; will require Ancef 6 weeks -ID; recommends TEE when stable; would revisit indication for this procedure after patient's multiple surgeries completed.   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 (orthopedic surgery) recommended compression stockings and follow-up as an outpatient. Conservative treatment for now.    RLE DVT -Continue argatroban per surgery  Left hand radius fracture/ cellulitis w/Lt wrist abscess? -Left arm ultrasound nondiagnostic for abscess (preliminary read); patient unable to be positioned properly for LUE CT scan. -[name], hand surgeon performed I&D left wrist 9/27  Pulmonary hypertension/diastolic dysfunction -Transfuse for Hb<8 -9/22 transfuse 1 unit PRBC -9/24 transfuse 1 unit PRBC  Acute renal failure(Cr=0.9 at baseline) -improving with aggressive hydration, but not back to baseline.. -Hold any nephrotoxic medications. -D5-0.9% saline KVO  Possible pyelonephritis -Creatinine WNL  Elevated INR with coagulopathy -Resolving   Hypokalemia -Potassium goal > 4  -Potassium IV 40 mEq   Hypomagnesemia -Magnesium goal>2  Chronic pain -been pain medications for 12 years - managed by Dr. [name] [facility]?).  -Takes high dose scheduled klonopin. Have ordered chronic pain meds as he takes at home with holding parameters.  -Klonopin 0.5 TID PRN.    Code Status: FULL Family Communication: None present at time of exam Disposition Plan: Per surgery    Consultants: Dr. Knute Neu Hand Surgery Dr.Brian Rolena [facility]  Dr. [name] (ID)   Procedure/Significant Events: 9/15, 9/17, and 9/19 Right thigh abscess I&D, Excision of infected right superficial femoral artery aneurysm 9/22 bilateral LE Doppler;Right: DVT noted in the FV, Pop v, Gastrocnemius v, PTV. Superficial thrombosis noted in the LSV. Unable to image  CFV due to bandage/wound vac. No Baker's cyst.  -Left: No evidence of DVT/SVT/ Baker's cyst. 9/19 echocardiogram;-  LVEF=  55% to 60%.-(grade 1 diastolic dysfunction). - Pulmonary arteries: PApeak pressure: 41 mm Hg (S). 9/22 transfuse 1 unit PRBC 9/24 transfuse 1 unit PRBC 9/24 LUE venous Doppler: Negative for DVT/SVT 9/27; I&D Lt Hand Abscess   Culture 9/10 blood right hand positive MSSA 9/11 blood right forearm positive MSSA 9/11 negative MRSA by PCR 9/11 urine negative 9/12 blood right AC NGTD   Antibiotics: Zosyn 9/10 > stopped 9/13 Vancomycin 9/10 > stopped 9/15 Ancef 9/14>>   Famciclovir PRN  9/11>>   DVT prophylaxis: SCD   Devices    LINES / TUBES:      Continuous Infusions: . dextrose 5 % and 0.9% NaCl 10 mL/hr at 04/25/15 0753    Objective: VITAL SIGNS: Temp: 99.7 F (37.6 C) (09/29 1534) Temp Source: Oral (09/29 1534) BP: 125/82 mmHg (09/29 1534) Pulse Rate: 96 (09/29 1534) SPO2; FIO2:   Intake/Output Summary (Last 24 hours) at 04/25/15 1917 Last data filed at 04/25/15 1500  Gross per 24 hour  Intake    760 ml  Output   2340 ml  Net  -1580 ml     Exam: General: A/O  4, No acute respiratory distress, significant pain secondary to multiple surgeries and left wrist abscess I&D Eyes: Negative headache, negative scleral hemorrhage ENT: Negative Runny nose, negative ear pain, negative gingival bleeding, Neck:  Negative scars, masses, torticollis, lymphadenopathy, JVD Lungs: Clear to auscultation bilaterally without wheezes or crackles Cardiovascular: Regular rhythm and rate without murmur gallop or rub normal S1 and S2 Abdomen:negative abdominal pain, nondistended, positive soft bowel sounds, no rebound, no ascites, no appreciable mass, midline incision covered and clean with abdominal binder on. Extremities: left forearm/wrist/hand decreased swelling S/P I&D, covered and clean no sign of further infection, negative warmth to the touch, area on radial area., right thigh/inguinal area swollen, erythematous, warm to touch, surgical area wound VAC in place.  Right foot first and second and third metatarsal bluish in color, (stable) , cool to touch, loss of sensation in first/second/third metatarsal, remainder of foot and leg 3-4+ edema. painful to the touch. Psychiatric:  Negative depression, negative anxiety, negative fatigue, negative mania  Neurologic:  Cranial nerves II through XII intact, tongue/uvula midline, all extremities muscle strength 5/5, sensation intact throughout,  negative dysarthria, negative expressive aphasia, negative receptive aphasia.   Data Reviewed: Basic Metabolic Panel:  Recent Labs Lab 04/20/15 0459 04/21/15 0520 04/22/15 0532 04/23/15 0420 04/24/15 0647 04/25/15 0355  NA 138 136 138 136  --  135  K 3.3* 3.4* 3.3* 3.4*  --  3.6  CL 101 99* 102 99*  --  98*  CO2 --  29  GLUCOSE 137* 110* 103* 98  --  97  BUN --  8  CREATININE 1.11 1.00 0.93 0.97  --  0.94  CALCIUM 7.6* 7.5* 7.7* 7.6*  --  7.7*  MG  --  1.5* 1.8 1.6* 2.0  --    Liver Function Tests:  Recent Labs Lab 04/20/15 0459 04/21/15 0520 04/22/15 0532 04/23/15 0420 04/25/15 0355  AST 38 33 55* 60* 36  ALT 11* 10* 17 20 13*  ALKPHOS 171* 132* 136* 156* 133*  BILITOT 0.6 0.5 0.6 0.9 0.5  PROT 6.2* 6.0* 6.3* 6.3* 6.3*  ALBUMIN 1.3* 1.4* 1.4* 1.5* 1.5*   No results for input(s): LIPASE, AMYLASE in the  last 168 hours. No results for input(s): AMMONIA in the last 168 hours. CBC:  Recent Labs Lab 04/21/15 0520 04/22/15 0532 04/23/15 0420 04/24/15 0647 04/25/15 0355  WBC 10.1 8.2 8.3 7.9 9.2  NEUTROABS 7.8* 6.0 5.6 5.5 6.0  HGB 8.1* 7.6* 8.9* 8.5* 8.7*  HCT 25.9* 24.9* 28.1* 27.9* 28.0*  MCV 87.5 87.4 87.3 88.0 87.8  PLT 434* 440* 409* 414* 426*   Cardiac Enzymes: No results for input(s): CKTOTAL, CKMB, CKMBINDEX, TROPONINI in the last 168 hours. BNP (last 3 results) No results for input(s): BNP in the last 8760 hours.  ProBNP (last 3 results) No results for input(s): PROBNP in the last 8760  hours.  CBG:  Recent Labs Lab 04/19/15 1530  GLUCAP 87    No results found for this or any previous visit (from the past 240 hour(s)).   Studies:  Recent x-ray studies have been reviewed in detail by the Attending Physician  Scheduled Meds:  Scheduled Meds: .  ceFAZolin (ANCEF) IV  2 g Intravenous 3 times per day  . docusate sodium  100 mg Oral BID  . enoxaparin (LOVENOX) injection  1 mg/kg Subcutaneous Q12H  . feeding supplement (ENSURE ENLIVE)  237 mL Oral TID BM  . metoprolol  10 mg Intravenous Once  . metoprolol tartrate  25 mg Oral BID  . morphine  90 mg Oral 2 times per day  . morphine   Intravenous 6 times per day  . naloxegol oxalate  25 mg Oral Daily  . potassium chloride  10 mEq Intravenous Q1 Hr x 4  . sodium chloride  3 mL Intravenous Q12H  . venlafaxine XR  75 mg Oral Q breakfast  . Warfarin - Pharmacist Dosing Inpatient   Does not apply q1800    Time spent on care of this patient: 40 mins   WOODS, Roselind Messier , MD  [facility] Office  [phone] Pager - [phone]  On-Call/Text Page:      [email or web address]      password [email or web address]  If 7PM-7AM, please contact night-coverage [email or web address] Password [email or web address] 04/25/2015, 7:17 PM   LOS: 18 days   Care during the described time interval was provided by me .  I have reviewed this patient's available data, including medical history, events of note, physical examination, and all test results as part of my evaluation. I have personally reviewed and interpreted all radiology studies.   Carolyne Littles, MD [phone] Pager

## 2015-04-25 NOTE — Progress Notes (Signed)
ANTICOAGULATION CONSULT NOTE  Pharmacy Consult for Warfarin Indication: R foot thromboembolism  Allergies  Allergen Reactions  . Imitrex [Sumatriptan] Other (See Comments)    Chest pain, 2004 (tolerates 50 mg prn)  . Verapamil Other (See Comments)    Causes pvc's    Patient Measurements: Height:  (190.5 cm) Weight: 228 lb (103.42 kg) IBW/kg (Calculated) : 84.5  Vital Signs: Temp: 99.5 F (37.5 C) (09/29 1233) Temp Source: Oral (09/29 1233) BP: 122/73 mmHg (09/29 1233) Pulse Rate: 97 (09/29 1233)  Labs:  Recent Labs  04/23/15 0420 04/23/15 0810 04/24/15 0647 04/24/15 1800 04/25/15 0355  HGB 8.9*  --  8.5*  --  8.7*  HCT 28.1*  --  27.9*  --  28.0*  PLT 409*  --  414*  --  426*  APTT  --  68* 68*  --   --   LABPROT  --   --   --  17.8* 17.8*  INR  --   --   --  1.46 1.46  CREATININE 0.97  --   --   --  0.94    Estimated Creatinine Clearance: 112.9 mL/min (by C-G formula based on Cr of 0.94).  Assessment: 59 yo M seen in the ED for hand pain and swelling s/p fall a few days ago. Found to be tachychardic and hypotensive in the ED w/ LA 4.16 > 2.4.   PMH: PUD, HLD, MRSA abscess, Peripheral Neuropathy, Chronic pain  Anticoagulation - Previously on Xarelto after left knee revision, noted dc'd in may d/t bleeding into knee. For R femoral DVT On warfarin bridge with enoxaparin (Day 1 9/28). INR 1.46  Hematology / Oncology - H&H 8.7/28, Plt 426  Goal of Therapy:  INR 2-3 Monitor platelets by anticoagulation protocol: Yes   Plan:  -Warfarin 5 mg x 1 -Enoxaparin 105 mg BID  -Monitor for s/sx of bleeding  Isaac Bliss, PharmD, BCPS Clinical Pharmacist Pager [phone] 04/25/2015 1:19 PM

## 2015-04-26 ENCOUNTER — Inpatient Hospital Stay (HOSPITAL_COMMUNITY): Payer: Medicaid Other

## 2015-04-26 LAB — CBC WITH DIFFERENTIAL/PLATELET
Basophils Absolute: 0 10*3/uL (ref 0.0–0.1)
Basophils Relative: 1 %
EOS PCT: 3 %
Eosinophils Absolute: 0.3 10*3/uL (ref 0.0–0.7)
HCT: 27.1 % — ABNORMAL LOW (ref 39.0–52.0)
Hemoglobin: 8.4 g/dL — ABNORMAL LOW (ref 13.0–17.0)
LYMPHS ABS: 1.3 10*3/uL (ref 0.7–4.0)
LYMPHS PCT: 16 %
MCH: 27.3 pg (ref 26.0–34.0)
MCHC: 31 g/dL (ref 30.0–36.0)
MCV: 88 fL (ref 78.0–100.0)
MONOS PCT: 14 %
Monocytes Absolute: 1.2 10*3/uL — ABNORMAL HIGH (ref 0.1–1.0)
Neutro Abs: 5.7 10*3/uL (ref 1.7–7.7)
Neutrophils Relative %: 66 %
PLATELETS: 398 10*3/uL (ref 150–400)
RBC: 3.08 MIL/uL — ABNORMAL LOW (ref 4.22–5.81)
RDW: 15.7 % — AB (ref 11.5–15.5)
WBC: 8.6 10*3/uL (ref 4.0–10.5)

## 2015-04-26 LAB — PROTIME-INR
INR: 1.57 — ABNORMAL HIGH (ref 0.00–1.49)
Prothrombin Time: 18.8 seconds — ABNORMAL HIGH (ref 11.6–15.2)

## 2015-04-26 MED ORDER — SODIUM CHLORIDE 0.9 % IV SOLN
INTRAVENOUS | Status: DC
  Administered 2015-04-26: 19:00:00 via INTRAVENOUS

## 2015-04-26 MED ORDER — WARFARIN SODIUM 5 MG PO TABS
5.0000 mg | ORAL_TABLET | Freq: Once | ORAL | Status: AC
  Administered 2015-04-26: 5 mg via ORAL
  Filled 2015-04-26 (×2): qty 1

## 2015-04-26 MED ORDER — SODIUM CHLORIDE 0.9 % IJ SOLN
10.0000 mL | INTRAMUSCULAR | Status: DC | PRN
  Administered 2015-04-27 – 2015-04-29 (×4): 10 mL
  Filled 2015-04-26 (×4): qty 40

## 2015-04-26 NOTE — Progress Notes (Signed)
POD# 7 rectus flap to right groin/thigh, over sewing femoral vein  Temp:  [98.6 F (37 C)-100.7 F (38.2 C)] 98.6 F (37 C) (09/30 1101) Pulse Rate:  [90-97] 90 (09/30 1101) Resp:  [13-19] 16 (09/30 1104) BP: (122-142)/(73-87) 136/79 mmHg (09/30 1101) SpO2:  [94 %-100 %] 98 % (09/30 1104)   JP thigh 90 cc VAC 50 cc  On Lovenox, warfarin   PE Abdomen dressing dry, Right groin dressing changed, incision dry   RLE proximal thigh : VAC change done, 5 x 2.5 x 1 cm open area with no undermining skin flaps, base of wound is muscle flap that is partially granulated. Drain serosanguinous with edema fluid drainage around drain exit site  VAC with watery serosanguinous drainage   A/P S/p rectus muscle flap to prior abscess cavity over stumps of vessels  Dry dressing to abdomen and right groin every other day and PRN  Will change VAC again 10/4, can consider switching to dressings at that time, will have edema fluid drainage from skin that may necessitate frequent dressing changes.   Glenna Fellows, MD [facility] [phone]

## 2015-04-26 NOTE — Progress Notes (Signed)
Peripherally Inserted Central Catheter/Midline Placement  The IV Nurse has discussed with the patient and/or persons authorized to consent for the patient, the purpose of this procedure and the potential benefits and risks involved with this procedure.  The benefits include less needle sticks, lab draws from the catheter and patient may be discharged home with the catheter.  Risks include, but not limited to, infection, bleeding, blood clot (thrombus formation), and puncture of an artery; nerve damage and irregular heat beat.  Alternatives to this procedure were also discussed.  PICC/Midline Placement Documentation        [name] 04/26/2015, 5:55 PM

## 2015-04-26 NOTE — Progress Notes (Signed)
Report called to Reynaldo Minium, RN.  All personal belongings packed ready to move with pt.

## 2015-04-26 NOTE — Progress Notes (Signed)
Utilization review completed.  

## 2015-04-26 NOTE — Progress Notes (Signed)
ANTICOAGULATION CONSULT NOTE  Pharmacy Consult for Warfarin Indication: R foot thromboembolism  Allergies  Allergen Reactions  . Imitrex [Sumatriptan] Other (See Comments)    Chest pain, 2004 (tolerates 50 mg prn)  . Verapamil Other (See Comments)    Causes pvc's    Patient Measurements: Height:  (190.5 cm) Weight: 228 lb (103.42 kg) IBW/kg (Calculated) : 84.5 Heparin Dosing Weight:   Vital Signs: Temp: 99 F (37.2 C) (09/30 1217) Temp Source: Oral (09/30 1217) BP: 136/79 mmHg (09/30 1101) Pulse Rate: 90 (09/30 1101)  Labs:  Recent Labs  04/24/15 0647 04/24/15 1800 04/25/15 0355 04/26/15 0403  HGB 8.5*  --  8.7* 8.4*  HCT 27.9*  --  28.0* 27.1*  PLT 414*  --  426* 398  APTT 68*  --   --   --   LABPROT  --  17.8* 17.8* 18.8*  INR  --  1.46 1.46 1.57*  CREATININE  --   --  0.94  --     Estimated Creatinine Clearance: 112.9 mL/min (by C-G formula based on Cr of 0.94).   Medications:  Prescriptions prior to admission  Medication Sig Dispense Refill Last Dose  . albuterol (PROAIR HFA) 108 (90 BASE) MCG/ACT inhaler Inhale 1 puff into the lungs every 6 (six) hours as needed for wheezing or shortness of breath.    04/06/2015 at Unknown time  . atenolol (TENORMIN) 100 MG tablet Take 100 mg by mouth daily.   2-3 days ago at usually around 6am  . atorvastatin (LIPITOR) 80 MG tablet Take 80 mg by mouth daily.   few days ago  . clonazePAM (KLONOPIN) 2 MG tablet Take 2 mg by mouth 4 (four) times daily.   04/07/2015 at 1400  . Diclofenac-Misoprostol 75-0.2 MG TBEC Take 1 tablet by mouth 2 (two) times daily. Arthrotec  0 04/06/2015 at pm  . famciclovir (FAMVIR) 500 MG tablet Take 500 mg by mouth 2 (two) times daily as needed (fever blisters).    1-2 weeks ago  . ibuprofen (ADVIL,MOTRIN) 800 MG tablet Take 1 tablet (800 mg total) by mouth 3 (three) times daily. (Patient taking differently: Take 800 mg by mouth 2 (two) times daily as needed (pain). ) 21 tablet 0 3 weeks ago  .  morphine (MS CONTIN) 30 MG 12 hr tablet Take 30 mg by mouth every 12 (twelve) hours. Take with a 60 mg tablet for a 90 mg dose  0 04/06/2015 at 900  . morphine (MS CONTIN) 60 MG 12 hr tablet Take 60 mg by mouth every 12 (twelve) hours. Take with a 30 mg tablet for a 90 mg dose  0 04/06/2015 at 900  . naloxegol oxalate (MOVANTIK) 25 MG TABS tablet Take 25 mg by mouth 2 (two) times daily.   04/05/2015  . ondansetron (ZOFRAN) 8 MG tablet Take 8 mg by mouth daily as needed for nausea or vomiting.   0 04/06/2015 at Unknown time  . OVER THE COUNTER MEDICATION Place 1 drop into both eyes 2 (two) times daily as needed (dry eyes). OTC lubricant eye drops   2-3 weeks ago  . oxycodone (ROXICODONE) 30 MG immediate release tablet Take 30 mg by mouth every 6 (six) hours. scheduled   04/06/2015 at pm  . SUMAtriptan (IMITREX) 50 MG tablet Take 50 mg by mouth daily as needed for migraine or headache. Maximum 2 doses in 2 days  0 couple weeks ago  . testosterone cypionate (DEPOTESTOSTERONE CYPIONATE) 200 MG/ML injection Inject 400 mg into  the muscle once a week. On Wednesday  0 04/03/2015  . trifluridine (VIROPTIC) 1 % ophthalmic solution Place 1 drop into both eyes 2 (two) times daily as needed (breakouts (blurry, matted eyelids)).   3 weeks ago  . venlafaxine XR (EFFEXOR-XR) 37.5 MG 24 hr capsule Take 37.5 mg by mouth daily with breakfast.   04/04/2015  . colchicine 0.6 MG tablet Take 1 tablet (0.6 mg total) by mouth 2 (two) times daily. (Patient not taking: Reported on 04/07/2015) 14 tablet 0 Not Taking at Unknown time  . oxyCODONE-acetaminophen (PERCOCET/ROXICET) 5-325 MG per tablet Take 1 tablet by mouth every 6 (six) hours as needed for severe pain. (Patient not taking: Reported on 04/07/2015) 10 tablet 0 Not Taking at Unknown time   Scheduled:  .  ceFAZolin (ANCEF) IV  2 g Intravenous 3 times per day  . docusate sodium  100 mg Oral BID  . enoxaparin (LOVENOX) injection  1 mg/kg Subcutaneous Q12H  . feeding supplement  (ENSURE ENLIVE)  237 mL Oral TID BM  . metoprolol  10 mg Intravenous Once  . metoprolol tartrate  25 mg Oral BID  . morphine  90 mg Oral 2 times per day  . morphine   Intravenous 6 times per day  . naloxegol oxalate  25 mg Oral Daily  . sodium chloride  3 mL Intravenous Q12H  . venlafaxine XR  75 mg Oral Q breakfast  . Warfarin - Pharmacist Dosing Inpatient   Does not apply q1800   Infusions:  . dextrose 5 % and 0.9% NaCl 10 mL/hr at 04/25/15 0753    Assessment: 58yo male with long hospital course of heparin followed by argatroban and now lovenox for R foot thromboembolism. Pharmacy is consulted to dose warfarin for VTE treatment (day 3 of overlap with lovenox). INR 1.57 with trend up.  Goal of Therapy:  INR 2-3 Monitor platelets by anticoagulation protocol: Yes   Plan:  Continue warfarin  tonight x1 Continue lovenox /kg subcutaneously q12h Daily INR/CBC  Harland German, Pharm D 04/26/2015 1:15 PM

## 2015-04-26 NOTE — Progress Notes (Signed)
[facility] TEAM 1 - Stepdown/ICU TEAM PROGRESS NOTE  [name] ZOX:[id number] DOB: 1956-04-04 DOA: 04/06/2015 PCP: [name], MD  Admit HPI / Brief Narrative: 59 y.o. Male with PUD, HLD, history of MRSA abscesses, peripheral neuropathy, and chronic pain who presented to [facility] with severe left hand pain. He was found to be septic with acute kidney injury and left hand cellulitis. After admission he was noted to have a dusky swollen right foot. The patient reported that approximately 2 weeks prior he started feeling poorly and eating less. 9/7 he could not feel his right foot and fell as a result. He landed on his left wrist, shoulder, and back side. Since then his left hand has been very painful and his wrist and arm had begun to swell. He has developed thick erythematous streaking on his left arm.    In the ER: Lactic acid was 4.16, INR 2.26 (not on anticoag), PTT 24.7, WBC 31.3, mag 1.3, potassium 2.7, procalcitonin 9.72. BP 80/68 He received 3L of IV bolus in the ER.  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 Reportedly the patient injected his groin with testosterone 3 weeks prior.  HPI/Subjective The patient is alert and interactive today.  He states his pain is presently well controlled.  He denies chest pain shortness breath fevers chills nausea or vomiting.  Overall he feels he is progressing slowly but consistently.   Assessment/Plan:  Severe sepsis - MSSA bacteremia  -suspect his initial site was the R groin injection site  -Patient was hypotensive, tachycardic, with a white count of 31.3 on initial workup = sepsis  -Continue antibiotics as per ID -ID suggest will need TEE when more stable clinically - Cards felt  that TTE would suffice - TTE did not note vegetation, but given serious nature of his illness I feel most comfortable w/ TEE to better evaluate his valves and help determine if a longer course of abx is indicated, or  even valve procedure - will ask Cards to reconsider TEE once pt more stable from his recent surgerys (early next week)  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 has evaluated the foot and feels watchful waiting is appropriate and wishes to see the patient in follow-up in his office - ongoing wound care per Plastic Surgery service   R femoral vein disruption/DVT - severe R LE / groin edema  In OR 9/23 became clear femoral vein involved in infection, and fully thrombosed - Vasc Surgery oversewed the divided vessel at the time of surgery 9/23 - edema appears to be slowly improving  Acute blood loss anemia Due to infected vascular region s/p multiple surgeries + sepsis - transfuse as needed to keep Hgb 7.0 or > - stable today  Sinus tachycardia > SVT > [facility]  Cardiology has completed an evaluation and recommends adenosine 6 mg should the patient suffer another episode of atrial tachycardia - heart rate stable  Acute renal failure Creatinine is 0.9 at baseline - 4.4 on admission - has now normalized / recovered   L arm swelling - Left hand cellulitis with radius fracture -  history of MRSA abscess Evaluated by [name] initially, w/ his plan being for the pt to follow-up in his office in approximately 2 weeks - swelling of arm noted 9/23 - venous duplex 9/24 w/o evidence of DVT - s/p Incision and Drainage of L Hand Abscess 9/27 - ongoing care per Hand Surgery   Possible pyelonephritis Urinalysis suggestive of infection - adequately covered with broad-spectrum empiric antibiotics - urine culture not helpful   Elevated INR with  coagulopathy Felt to be due to sepsis - resolved   Hypokalemia and hypomagnesemia Recurrent - replace as needed   Chronic pain has been on pain medications for 12 years - managed by Dr. [name] [facility] - also takes high dose scheduled klonopin - pain currently controlled w/ PCA   Code Status: FULL Family Communication: spoke w/ pt and wife at bedside  Disposition Plan: transfer to tele bed - Plastics attending to groin wound / Hand Surgery to L hand - PT/OT - ask Cards for TEE early next week   Consultants: Hand Surgery Vascular Surgery  ID Cardiology - [facility] Plastic surgery Orthopedics - [name]   Antibiotics: Zosyn 9/10 > 9/12 Vancomycin 9/10 > 9/15 Cefazolin 9/14 >  DVT prophylaxis: lovenox > coumadin    Objective: Blood pressure 136/79, pulse 90, temperature 98.6 F (37 C), temperature source Oral, resp. rate 16, height  (1.905 m), weight 103.42 kg (228 lb), SpO2 98 %.  Intake/Output Summary (Last 24 hours) at 04/26/15 1124 Last data filed at 04/26/15 1100  Gross per 24 hour  Intake    897 ml  Output   3650 ml  Net  -2753 ml   Exam: General: No acute respiratory distress -  alert and bright - affect cheerful  Lungs: Clear to auscultation bilaterally w/o wheeze - distant HS  Cardiovascular:  regular rate and rhythm without murmur or gallup  Abdomen: Nontender, nondistended, soft, bowel sounds positive, no rebound, no ascites, no appreciable mass Extremities:  1+ edema left upper extremity with L hand dressed/dry, right lower extremity with stable 2+ edema - multiple areas of nonviable skin across digits right foot without severe surrounding erythema   Data Reviewed: Basic Metabolic Panel:  Recent Labs Lab 04/20/15 0459 04/21/15 0520 04/22/15 0532 04/23/15 0420 04/24/15 0647 04/25/15 0355  NA 138 136 138 136  --  135  K 3.3* 3.4* 3.3* 3.4*  --  3.6  CL 101 99* 102 99*  --  98*  CO2 --  29  GLUCOSE 137* 110* 103* 98  --  97  BUN --  8  CREATININE 1.11 1.00 0.93 0.97  --  0.94  CALCIUM 7.6* 7.5* 7.7* 7.6*  --  7.7*  MG  --  1.5* 1.8 1.6* 2.0  --     CBC:  Recent Labs Lab 04/22/15 0532 04/23/15 0420 04/24/15 0647 04/25/15 0355 04/26/15 0403  WBC 8.2 8.3 7.9 9.2 8.6  NEUTROABS 6.0 5.6 5.5 6.0 5.7  HGB 7.6* 8.9* 8.5* 8.7* 8.4*  HCT 24.9* 28.1* 27.9* 28.0* 27.1*  MCV 87.4 87.3 88.0 87.8 88.0  PLT 440* 409* 414* 426* 398    Liver Function Tests:  Recent Labs Lab 04/20/15 0459 04/21/15 0520 04/22/15 0532 04/23/15 0420 04/25/15 0355  AST 38 33 55* 60* 36  ALT 11* 10* 17 20 13*  ALKPHOS 171* 132* 136* 156* 133*  BILITOT 0.6 0.5 0.6 0.9 0.5  PROT 6.2* 6.0* 6.3* 6.3* 6.3*  ALBUMIN 1.3* 1.4* 1.4* 1.5* 1.5*   Coags:  Recent Labs Lab 04/20/15 0459 04/21/15 0520 04/22/15 0532 04/23/15 0810 04/24/15 0647  APTT 58* 77* 74* 68* 68*    Studies:   Recent x-ray studies have been reviewed in detail by the Attending Physician  Scheduled Meds:  Scheduled Meds: .  ceFAZolin (ANCEF) IV  2 g Intravenous 3 times per day  . docusate sodium  100 mg Oral BID  . enoxaparin (LOVENOX) injection  1 mg/kg Subcutaneous Q12H  . feeding supplement (ENSURE ENLIVE)  237 mL Oral TID BM  . metoprolol  10 mg Intravenous Once  . metoprolol tartrate  25 mg Oral BID  . morphine  90 mg Oral 2 times per day  . morphine   Intravenous 6 times per day  . naloxegol oxalate  25 mg Oral Daily  . sodium chloride  3 mL Intravenous Q12H  . venlafaxine XR  75 mg Oral Q breakfast  . Warfarin - Pharmacist Dosing Inpatient   Does not apply q1800    Time spent on care of this patient: 35 mins   MCCLUNG,JEFFREY T , MD   [facility] Office  [phone] Pager - Text Page per Loretha Stapler as per below:  On-Call/Text Page:      [email or web address]      password [email or web address]  If 7PM-7AM, please contact night-coverage [email or web address] Password [email or web address] 04/26/2015, 11:24 AM   LOS: 19 days

## 2015-04-26 NOTE — Progress Notes (Signed)
Pt with fungal rash in left axilla and groin area.  Dr. Sharon Seller texted to ask for antifungal powder.

## 2015-04-27 DIAGNOSIS — D62 Acute posthemorrhagic anemia: Secondary | ICD-10-CM

## 2015-04-27 DIAGNOSIS — R6520 Severe sepsis without septic shock: Secondary | ICD-10-CM

## 2015-04-27 DIAGNOSIS — M7989 Other specified soft tissue disorders: Secondary | ICD-10-CM

## 2015-04-27 DIAGNOSIS — L02512 Cutaneous abscess of left hand: Secondary | ICD-10-CM

## 2015-04-27 DIAGNOSIS — N10 Acute tubulo-interstitial nephritis: Secondary | ICD-10-CM

## 2015-04-27 DIAGNOSIS — S52532A Colles' fracture of left radius, initial encounter for closed fracture: Secondary | ICD-10-CM

## 2015-04-27 LAB — CBC WITH DIFFERENTIAL/PLATELET
BASOS ABS: 0 10*3/uL (ref 0.0–0.1)
Basophils Relative: 1 %
EOS PCT: 3 %
Eosinophils Absolute: 0.2 10*3/uL (ref 0.0–0.7)
HCT: 26.2 % — ABNORMAL LOW (ref 39.0–52.0)
HEMOGLOBIN: 8.2 g/dL — AB (ref 13.0–17.0)
LYMPHS ABS: 1.4 10*3/uL (ref 0.7–4.0)
LYMPHS PCT: 17 %
MCH: 27.5 pg (ref 26.0–34.0)
MCHC: 31.3 g/dL (ref 30.0–36.0)
MCV: 87.9 fL (ref 78.0–100.0)
Monocytes Absolute: 1.3 10*3/uL — ABNORMAL HIGH (ref 0.1–1.0)
Monocytes Relative: 16 %
NEUTROS ABS: 5.2 10*3/uL (ref 1.7–7.7)
NEUTROS PCT: 63 %
PLATELETS: 378 10*3/uL (ref 150–400)
RBC: 2.98 MIL/uL — AB (ref 4.22–5.81)
RDW: 15.6 % — ABNORMAL HIGH (ref 11.5–15.5)
WBC: 8.1 10*3/uL (ref 4.0–10.5)

## 2015-04-27 LAB — RENAL FUNCTION PANEL
ALBUMIN: 1.6 g/dL — AB (ref 3.5–5.0)
ANION GAP: 7 (ref 5–15)
BUN: 5 mg/dL — AB (ref 6–20)
CALCIUM: 7.9 mg/dL — AB (ref 8.9–10.3)
CO2: 30 mmol/L (ref 22–32)
Chloride: 99 mmol/L — ABNORMAL LOW (ref 101–111)
Creatinine, Ser: 0.85 mg/dL (ref 0.61–1.24)
GFR calc Af Amer: >60 mL/min (ref >60)
GLUCOSE: 98 mg/dL (ref 65–99)
PHOSPHORUS: 3.6 mg/dL (ref 2.5–4.6)
Potassium: 3.7 mmol/L (ref 3.5–5.1)
SODIUM: 136 mmol/L (ref 135–145)

## 2015-04-27 LAB — PROTIME-INR
INR: 1.87 — ABNORMAL HIGH (ref 0.00–1.49)
Prothrombin Time: 21.5 seconds — ABNORMAL HIGH (ref 11.6–15.2)

## 2015-04-27 MED ORDER — OXYCODONE HCL 5 MG PO TABS
30.0000 mg | ORAL_TABLET | Freq: Four times a day (QID) | ORAL | Status: DC | PRN
  Administered 2015-04-27 – 2015-04-30 (×10): 30 mg via ORAL
  Filled 2015-04-27 (×10): qty 6

## 2015-04-27 MED ORDER — MORPHINE SULFATE (PF) 4 MG/ML IV SOLN
4.0000 mg | INTRAVENOUS | Status: DC | PRN
  Administered 2015-04-27 – 2015-04-30 (×10): 4 mg via INTRAVENOUS
  Filled 2015-04-27 (×10): qty 1

## 2015-04-27 MED ORDER — OXYCODONE-ACETAMINOPHEN 5-325 MG PO TABS: 2.0000 | ORAL_TABLET | Freq: Four times a day (QID) | ORAL | Status: DC | PRN

## 2015-04-27 MED ORDER — WARFARIN SODIUM 5 MG PO TABS
5.0000 mg | ORAL_TABLET | Freq: Once | ORAL | Status: AC
Start: 2015-04-27 — End: 2015-04-27
  Administered 2015-04-27: 5 mg via ORAL
  Filled 2015-04-27: qty 1

## 2015-04-27 MED ORDER — FUROSEMIDE 10 MG/ML IJ SOLN
40.0000 mg | Freq: Once | INTRAMUSCULAR | Status: AC
  Administered 2015-04-27: 40 mg via INTRAVENOUS
  Filled 2015-04-27: qty 4

## 2015-04-27 MED ORDER — POTASSIUM CHLORIDE CRYS ER 20 MEQ PO TBCR
40.0000 meq | EXTENDED_RELEASE_TABLET | Freq: Once | ORAL | Status: AC
  Administered 2015-04-27: 40 meq via ORAL
  Filled 2015-04-27: qty 2

## 2015-04-27 NOTE — Progress Notes (Signed)
ANTICOAGULATION CONSULT NOTE  Pharmacy Consult for Warfarin Indication: R foot thromboembolism  Allergies  Allergen Reactions  . Imitrex [Sumatriptan] Other (See Comments)    Chest pain, 2004 (tolerates 50 mg prn)  . Verapamil Other (See Comments)    Causes pvc's   Patient Measurements: Height:  (190.5 cm) Weight: 228 lb (103.42 kg) IBW/kg (Calculated) : 84.5  Vital Signs: Temp: 99 F (37.2 C) (10/01 0612) Temp Source: Oral (10/01 0612) BP: 159/80 mmHg (10/01 0612) Pulse Rate: 94 (10/01 0612)  Labs:  Recent Labs  04/25/15 0355 04/26/15 0403 04/27/15 0445  HGB 8.7* 8.4* 8.2*  HCT 28.0* 27.1* 26.2*  PLT 426* 398 378  LABPROT 17.8* 18.8* 21.5*  INR 1.46 1.57* 1.87*  CREATININE 0.94  --  0.85   Estimated Creatinine Clearance: 124.9 mL/min (by C-G formula based on Cr of 0.85).  Medications:  Prescriptions prior to admission  Medication Sig Dispense Refill Last Dose  . albuterol (PROAIR HFA) 108 (90 BASE) MCG/ACT inhaler Inhale 1 puff into the lungs every 6 (six) hours as needed for wheezing or shortness of breath.    04/06/2015 at Unknown time  . atenolol (TENORMIN) 100 MG tablet Take 100 mg by mouth daily.   2-3 days ago at usually around 6am  . atorvastatin (LIPITOR) 80 MG tablet Take 80 mg by mouth daily.   few days ago  . clonazePAM (KLONOPIN) 2 MG tablet Take 2 mg by mouth 4 (four) times daily.   04/07/2015 at 1400  . Diclofenac-Misoprostol 75-0.2 MG TBEC Take 1 tablet by mouth 2 (two) times daily. Arthrotec  0 04/06/2015 at pm  . famciclovir (FAMVIR) 500 MG tablet Take 500 mg by mouth 2 (two) times daily as needed (fever blisters).    1-2 weeks ago  . ibuprofen (ADVIL,MOTRIN) 800 MG tablet Take 1 tablet (800 mg total) by mouth 3 (three) times daily. (Patient taking differently: Take 800 mg by mouth 2 (two) times daily as needed (pain). ) 21 tablet 0 3 weeks ago  . morphine (MS CONTIN) 30 MG 12 hr tablet Take 30 mg by mouth every 12 (twelve) hours. Take with a 60  mg tablet for a 90 mg dose  0 04/06/2015 at 900  . morphine (MS CONTIN) 60 MG 12 hr tablet Take 60 mg by mouth every 12 (twelve) hours. Take with a 30 mg tablet for a 90 mg dose  0 04/06/2015 at 900  . naloxegol oxalate (MOVANTIK) 25 MG TABS tablet Take 25 mg by mouth 2 (two) times daily.   04/05/2015  . ondansetron (ZOFRAN) 8 MG tablet Take 8 mg by mouth daily as needed for nausea or vomiting.   0 04/06/2015 at Unknown time  . OVER THE COUNTER MEDICATION Place 1 drop into both eyes 2 (two) times daily as needed (dry eyes). OTC lubricant eye drops   2-3 weeks ago  . oxycodone (ROXICODONE) 30 MG immediate release tablet Take 30 mg by mouth every 6 (six) hours. scheduled   04/06/2015 at pm  . SUMAtriptan (IMITREX) 50 MG tablet Take 50 mg by mouth daily as needed for migraine or headache. Maximum 2 doses in 2 days  0 couple weeks ago  . testosterone cypionate (DEPOTESTOSTERONE CYPIONATE) 200 MG/ML injection Inject 400 mg into the muscle once a week. On Wednesday  0 04/03/2015  . trifluridine (VIROPTIC) 1 % ophthalmic solution Place 1 drop into both eyes 2 (two) times daily as needed (breakouts (blurry, matted eyelids)).   3 weeks ago  .  venlafaxine XR (EFFEXOR-XR) 37.5 MG 24 hr capsule Take 37.5 mg by mouth daily with breakfast.   04/04/2015  . colchicine 0.6 MG tablet Take 1 tablet (0.6 mg total) by mouth 2 (two) times daily. (Patient not taking: Reported on 04/07/2015) 14 tablet 0 Not Taking at Unknown time  . oxyCODONE-acetaminophen (PERCOCET/ROXICET) 5-325 MG per tablet Take 1 tablet by mouth every 6 (six) hours as needed for severe pain. (Patient not taking: Reported on 04/07/2015) 10 tablet 0 Not Taking at Unknown time   Scheduled:  .  ceFAZolin (ANCEF) IV  2 g Intravenous 3 times per day  . docusate sodium  100 mg Oral BID  . enoxaparin (LOVENOX) injection  1 mg/kg Subcutaneous Q12H  . feeding supplement (ENSURE ENLIVE)  237 mL Oral TID BM  . metoprolol tartrate  25 mg Oral BID  . morphine  90 mg Oral 2  times per day  . morphine   Intravenous 6 times per day  . naloxegol oxalate  25 mg Oral Daily  . sodium chloride  3 mL Intravenous Q12H  . venlafaxine XR  75 mg Oral Q breakfast  . Warfarin - Pharmacist Dosing Inpatient   Does not apply q1800   Infusions:  . sodium chloride 10 mL/hr at 04/26/15 1846   Assessment: 59yo male with long hospital course of heparin followed by argatroban and now lovenox for R foot thromboembolism. Pharmacy is consulted to dose warfarin for VTE treatment (day 4 of overlap with lovenox). INR 1.87 with trend up nicely.  His CBC is (low) but stable and no noted bleeding complications.  Plans for VAC change on 10/4 noted.  Current Lovenox dose is at /kg q12hr which is treatment dosing.  His renal function remains normal.  Goal of Therapy:  INR 2-3 Monitor platelets by anticoagulation protocol: Yes   Plan:  Continue warfarin  tonight x1 Continue lovenox /kg subcutaneously q12h Daily INR/CBC   04/27/2015 9:07 AM

## 2015-04-27 NOTE — Progress Notes (Deleted)
Pt BP was 179/97 rechecked 169/93 asymptomatic accdg to patient he never taking any BP meds , on call Dr. Frederik Schmidt informed and no new order made at this time , close monitoring.

## 2015-04-27 NOTE — Progress Notes (Signed)
[facility] TEAM 1 - Stepdown/ICU TEAM PROGRESS NOTE  [name] EAV:[id number] DOB: [birth year] DOA: 04/06/2015 PCP: [name], MD  Admit HPI / Brief Narrative: 59 y.o. Male with PUD, HLD, history of MRSA abscesses, peripheral neuropathy, and chronic pain who presented to [facility] with severe left hand pain. He was found to be septic with acute kidney injury and left hand cellulitis. After admission he was noted to have a dusky swollen right foot. The patient reported that approximately 2 weeks prior he started feeling poorly and eating less. 9/7 he could not feel his right foot and fell as a result. He landed on his left wrist, shoulder, and back side. Since then his left hand has been very painful and his wrist and arm had begun to swell. He has developed thick erythematous streaking on his left arm.    In the ER: Lactic acid was 4.16, INR 2.26 (not on anticoag), PTT 24.7, WBC 31.3, mag 1.3, potassium 2.7, procalcitonin 9.72. BP 80/68 He received 3L of IV bolus in the ER.  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 Reportedly the patient injected his groin with testosterone 3 weeks prior.  HPI/Subjective Feels pretty well, denies any fever or chills. Has pain around his right groin. Still has impressive edema in his right lower extremity and left upper extremity.   Assessment/Plan:  Severe sepsis - MSSA bacteremia  -suspect his initial site was the R groin injection site  -Patient was hypotensive, tachycardic, with a white count of 31.3 on initial workup = sepsis  -Continue antibiotics as per ID for total of 6 weeks -ID recommended TEE on able to, cardiology recommended to continue antibiotics without TEE. -TTE did not show valvular insufficiency or abscess, doubt TEE is going to show anything will needs intervention -Patient already getting 6 weeks of antibiotics so is if vegetation seen it's already covered by antibiotics.   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 has evaluated the foot and feels watchful waiting is appropriate and wishes to see the patient in follow-up in his office - ongoing wound care per Plastic Surgery service   R femoral vein disruption/DVT - severe R LE / groin edema  In OR 9/23 became clear femoral vein involved in infection, and fully thrombosed - Vasc Surgery oversewed the divided vessel at the time of surgery 9/23 - edema appears to be slowly improving  Acute blood loss anemia Due to infected vascular region s/p multiple surgeries + sepsis - transfuse as needed to keep Hgb 7.0 or > - stable today  Sinus tachycardia > SVT > [facility]  Cardiology has completed an evaluation and recommends adenosine 6 mg should the patient suffer another episode of atrial tachycardia - heart rate stable  Acute renal failure Creatinine is 0.9 at baseline - 4.4 on admission - has now normalized / recovered   L arm swelling - Left hand cellulitis with radius fracture - history of MRSA abscess Evaluated by [name] initially, w/ his plan being for the pt to follow-up in his office in approximately 2 weeks - swelling of arm noted  9/23 - venous duplex 9/24 w/o evidence of DVT - s/p Incision and Drainage of L Hand Abscess 9/27 - ongoing care per Hand Surgery   Possible pyelonephritis Urinalysis suggestive of infection - adequately covered with broad-spectrum empiric antibiotics - urine culture not helpful   Elevated INR with coagulopathy Felt to be due to sepsis - resolved   Hypokalemia and hypomagnesemia Recurrent - replace as needed   Chronic  pain has been on pain medications for 12 years - managed by Dr. [name] [facility]?) - also takes high dose scheduled klonopin - pain currently controlled w/ PCA   Code Status: FULL Family Communication: spoke w/ pt and wife at bedside  Disposition Plan: transfer to tele bed - Plastics attending to groin wound / Hand Surgery to L hand - PT/OT - ask Cards for TEE early next week   Consultants: Hand Surgery Vascular Surgery  ID Cardiology - [facility] Plastic surgery Orthopedics - [name]   Antibiotics: Zosyn 9/10 > 9/12 Vancomycin 9/10 > 9/15 Cefazolin 9/14 >  DVT prophylaxis: lovenox > coumadin    Objective: Blood pressure 133/69, pulse 95, temperature 99 F (37.2 C), temperature source Oral, resp. rate 15, height  (1.905 m), weight 103.42 kg (228 lb), SpO2 96 %.  Intake/Output Summary (Last 24 hours) at 04/27/15 1122 Last data filed at 04/27/15 0900  Gross per 24 hour  Intake  788.9 ml  Output   2200 ml  Net -1411.1 ml   Exam: General: No acute respiratory distress -  alert and bright - affect cheerful  Lungs: Clear to auscultation bilaterally w/o wheeze - distant HS  Cardiovascular:  regular rate and rhythm without murmur or gallup  Abdomen: Nontender, nondistended, soft, bowel sounds positive, no rebound, no ascites, no appreciable mass Extremities:  1+ edema left upper extremity with L hand dressed/dry, right lower extremity with stable 2+ edema - multiple areas of nonviable skin across digits right foot without severe surrounding erythema   Data Reviewed: Basic Metabolic Panel:  Recent Labs Lab 04/21/15 0520 04/22/15 0532 04/23/15 0420 04/24/15 0647 04/25/15 0355 04/27/15 0445  NA 136 138 136  --  135 136  K 3.4* 3.3* 3.4*  --  3.6 3.7  CL 99* 102 99*  --  98* 99*  CO2 --  29 30  GLUCOSE 110* 103* 98  --  97 98  BUN --  8 5*  CREATININE 1.00 0.93 0.97  --  0.94 0.85  CALCIUM 7.5* 7.7* 7.6*  --  7.7* 7.9*  MG 1.5* 1.8 1.6* 2.0  --   --    PHOS  --   --   --   --   --  3.6    CBC:  Recent Labs Lab 04/23/15 0420 04/24/15 0647 04/25/15 0355 04/26/15 0403 04/27/15 0445  WBC 8.3 7.9 9.2 8.6 8.1  NEUTROABS 5.6 5.5 6.0 5.7 5.2  HGB 8.9* 8.5* 8.7* 8.4* 8.2*  HCT 28.1* 27.9* 28.0* 27.1* 26.2*  MCV 87.3 88.0 87.8 88.0 87.9  PLT 409* 414* 426* 398 378    Liver Function Tests:  Recent Labs Lab 04/21/15 0520 04/22/15 0532 04/23/15 0420 04/25/15 0355 04/27/15 0445  AST 33 55* 60* 36  --   ALT 10* 17 20 13*  --   ALKPHOS 132* 136* 156* 133*  --   BILITOT 0.5 0.6 0.9 0.5  --   PROT 6.0* 6.3* 6.3* 6.3*  --   ALBUMIN 1.4* 1.4* 1.5*  1.5* 1.6*   Coags:  Recent Labs Lab 04/21/15 0520 04/22/15 0532 04/23/15 0810 04/24/15 0647  APTT 77* 74* 68* 68*    Studies:   Recent x-ray studies have been reviewed in detail by the Attending Physician  Scheduled Meds:  Scheduled Meds: .  ceFAZolin (ANCEF) IV  2 g Intravenous 3 times per day  . docusate sodium  100 mg Oral BID  . enoxaparin (LOVENOX) injection  1 mg/kg Subcutaneous Q12H  . feeding supplement (ENSURE ENLIVE)  237 mL Oral TID BM  . metoprolol tartrate  25 mg Oral BID  . morphine  90 mg Oral 2 times per day  . morphine   Intravenous 6 times per day  . naloxegol oxalate  25 mg Oral Daily  . sodium chloride  3 mL Intravenous Q12H  . venlafaxine XR  75 mg Oral Q breakfast  . warfarin  5 mg Oral ONCE-1800  . Warfarin - Pharmacist Dosing Inpatient   Does not apply q1800    Time spent on care of this patient: 35 mins   Tamyrah Burbage A , MD   [facility] Office  [phone] Pager - Text Page per Amion as per below:  On-Call/Text Page:      [email or web address]      password [email or web address]  If 7PM-7AM, please contact night-coverage [email or web address] Password [email or web address] 04/27/2015, 11:22 AM   LOS: 20 days

## 2015-04-28 DIAGNOSIS — L0291 Cutaneous abscess, unspecified: Secondary | ICD-10-CM

## 2015-04-28 LAB — PROTIME-INR
INR: 1.97 — ABNORMAL HIGH (ref 0.00–1.49)
Prothrombin Time: 22.3 seconds — ABNORMAL HIGH (ref 11.6–15.2)

## 2015-04-28 LAB — URINALYSIS, ROUTINE W REFLEX MICROSCOPIC
Bilirubin Urine: NEGATIVE
GLUCOSE, UA: NEGATIVE mg/dL
Ketones, ur: NEGATIVE mg/dL
Leukocytes, UA: NEGATIVE
Nitrite: NEGATIVE
PH: 7.5 (ref 5.0–8.0)
Protein, ur: 100 mg/dL — AB
SPECIFIC GRAVITY, URINE: 1.013 (ref 1.005–1.030)
UROBILINOGEN UA: 1 mg/dL (ref 0.0–1.0)

## 2015-04-28 LAB — CBC WITH DIFFERENTIAL/PLATELET
Basophils Absolute: 0 10*3/uL (ref 0.0–0.1)
Basophils Relative: 1 %
EOS ABS: 0.2 10*3/uL (ref 0.0–0.7)
EOS PCT: 3 %
HCT: 26.3 % — ABNORMAL LOW (ref 39.0–52.0)
Hemoglobin: 8.3 g/dL — ABNORMAL LOW (ref 13.0–17.0)
LYMPHS ABS: 1.3 10*3/uL (ref 0.7–4.0)
LYMPHS PCT: 17 %
MCH: 27.5 pg (ref 26.0–34.0)
MCHC: 31.6 g/dL (ref 30.0–36.0)
MCV: 87.1 fL (ref 78.0–100.0)
MONO ABS: 1.3 10*3/uL — AB (ref 0.1–1.0)
Monocytes Relative: 16 %
Neutro Abs: 5.1 10*3/uL (ref 1.7–7.7)
Neutrophils Relative %: 63 %
PLATELETS: 372 10*3/uL (ref 150–400)
RBC: 3.02 MIL/uL — ABNORMAL LOW (ref 4.22–5.81)
RDW: 15.6 % — AB (ref 11.5–15.5)
WBC: 8 10*3/uL (ref 4.0–10.5)

## 2015-04-28 LAB — BASIC METABOLIC PANEL
Anion gap: 6 (ref 5–15)
BUN: 6 mg/dL (ref 6–20)
CHLORIDE: 97 mmol/L — AB (ref 101–111)
CO2: 31 mmol/L (ref 22–32)
Calcium: 7.7 mg/dL — ABNORMAL LOW (ref 8.9–10.3)
Creatinine, Ser: 0.93 mg/dL (ref 0.61–1.24)
GFR calc Af Amer: >60 mL/min (ref >60)
GFR calc non Af Amer: >60 mL/min (ref >60)
GLUCOSE: 106 mg/dL — AB (ref 65–99)
Potassium: 3.6 mmol/L (ref 3.5–5.1)
Sodium: 134 mmol/L — ABNORMAL LOW (ref 135–145)

## 2015-04-28 LAB — URINE MICROSCOPIC-ADD ON

## 2015-04-28 MED ORDER — METOPROLOL TARTRATE 1 MG/ML IV SOLN
5.0000 mg | Freq: Four times a day (QID) | INTRAVENOUS | Status: DC | PRN
  Filled 2015-04-28: qty 5

## 2015-04-28 MED ORDER — WARFARIN SODIUM 5 MG PO TABS
5.0000 mg | ORAL_TABLET | Freq: Once | ORAL | Status: AC
  Administered 2015-04-28: 5 mg via ORAL
  Filled 2015-04-28: qty 1

## 2015-04-28 MED ORDER — ADENOSINE 6 MG/2ML IV SOLN
6.0000 mg | INTRAVENOUS | Status: AC
  Administered 2015-04-28: 6 mg via INTRAVENOUS
  Filled 2015-04-28: qty 2

## 2015-04-28 MED ORDER — SODIUM CHLORIDE 0.9 % IV BOLUS (SEPSIS)
500.0000 mL | Freq: Once | INTRAVENOUS | Status: AC
  Administered 2015-04-28: 500 mL via INTRAVENOUS

## 2015-04-28 MED ORDER — BIOTENE DRY MOUTH MT LIQD: 15.0000 mL | OROMUCOSAL | Status: DC | PRN

## 2015-04-28 MED ORDER — METOPROLOL TARTRATE 50 MG PO TABS
50.0000 mg | ORAL_TABLET | Freq: Two times a day (BID) | ORAL | Status: DC
  Administered 2015-04-28 – 2015-04-29 (×3): 50 mg via ORAL
  Filled 2015-04-28 (×3): qty 1

## 2015-04-28 MED ORDER — METOPROLOL TARTRATE 1 MG/ML IV SOLN
2.5000 mg | Freq: Once | INTRAVENOUS | Status: AC
  Administered 2015-04-28: 2.5 mg via INTRAVENOUS
  Filled 2015-04-28: qty 5

## 2015-04-28 NOTE — Progress Notes (Signed)
PROGRESS NOTE  [name] ZOX:[id number] DOB: [birth year] DOA: 04/06/2015 PCP: [name], MD  Admit HPI / Brief Narrative: 59 y.o. Male with PUD, HLD, history of MRSA abscesses, peripheral neuropathy, and chronic pain who presented to [facility] with severe left hand pain. He was found to be septic with acute kidney injury and left hand cellulitis. After admission he was noted to have a dusky swollen right foot. The patient reported that approximately 2 weeks prior he started feeling poorly and eating less. 9/7 he could not feel his right foot and fell as a result. He landed on his left wrist, shoulder, and back side. Since then his left hand has been very painful and his wrist and arm had begun to swell. He has developed thick erythematous streaking on his left arm.    In the ER: Lactic acid was 4.16, INR 2.26 (not on anticoag), PTT 24.7, WBC 31.3, mag 1.3, potassium 2.7, procalcitonin 9.72. BP 80/68 He received 3L of IV bolus in the ER.  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 Reportedly the patient injected his groin with testosterone 3 weeks prior.  HPI/Subjective Developed SVT overnight with heart rate of 170-180., received 6 mg of adenosine, converted to sinus tachycardia. Heart rate is controlled, increase the metoprolol 50 mg twice a day.   Assessment/Plan:  Severe sepsis - MSSA bacteremia  -suspect his initial site was the R groin injection site  -Patient was hypotensive, tachycardic, with a white count of 31.3 on initial workup = sepsis  -Continue antibiotics as per ID for total of 6 weeks -ID recommended TEE on able to, cardiology recommended to continue antibiotics without TEE. -TTE did not show valvular insufficiency or abscess, doubt TEE is going to show anything will needs intervention -Patient already getting 6 weeks of antibiotics so is if vegetation seen it's already covered by antibiotics.   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 has evaluated the foot and feels watchful waiting is appropriate and wishes to see the patient in follow-up in his office - ongoing wound care per Plastic Surgery service  PT/OT to evaluate and treat. Patient on CIR  R femoral vein disruption/DVT - severe R LE / groin edema  In OR 9/23 became clear femoral vein involved in infection, and fully thrombosed - Vasc Surgery oversewed the divided vessel at the time of surgery 9/23 - edema appears to be slowly improving  Acute blood loss anemia Due to infected vascular region s/p multiple surgeries + sepsis - transfuse as needed to keep Hgb 7.0 or > - stable today  Sinus tachycardia > SVT > [facility]  Cardiology has completed an evaluation and recommends adenosine 6 mg should the patient suffer another episode of atrial tachycardia. SVT, received 6 mg of IV adenosine again on 10/2, converted to sinus tachycardia.  Acute renal failure Creatinine is 0.9 at baseline - 4.4 on admission - has now normalized / recovered   L arm swelling - Left hand cellulitis with radius fracture - history of MRSA abscess Evaluated by [name] initially, w/ his plan being for the pt  to follow-up in his office in approximately 2 weeks - swelling of arm noted 9/23 - venous duplex 9/24 w/o evidence of DVT - s/p Incision and Drainage of L Hand Abscess 9/27 - ongoing care per Hand Surgery   Possible pyelonephritis Urinalysis suggestive of infection - adequately covered with broad-spectrum empiric antibiotics - urine culture not helpful   Elevated INR with coagulopathy Felt to be due to sepsis -  resolved   Hypokalemia and hypomagnesemia Recurrent - replace as needed   Chronic pain syndrome/chronic periods use has been on pain medications for 12 years - managed by Dr. [name] [facility]?)  Back on his home dose of MS Contin 90 mg twice a day and OxyIR 30 mg every 6 hours as needed.   Code Status: FULL Family Communication: spoke w/ pt and wife at bedside  Disposition Plan: transfer to tele bed - Plastics attending to groin wound / Hand Surgery to L hand - PT/OT - ask Cards for TEE early next week   Consultants: Hand Surgery Vascular Surgery  ID Cardiology - [facility] Plastic surgery Orthopedics - [name]   Antibiotics: Zosyn 9/10 > 9/12 Vancomycin 9/10 > 9/15 Cefazolin 9/14 >  DVT prophylaxis: lovenox > coumadin    Objective: Blood pressure 106/67, pulse 108, temperature 98.8 F (37.1 C), temperature source Oral, resp. rate 20, height  (1.905 m), weight 103.42 kg (228 lb), SpO2 96 %.  Intake/Output Summary (Last 24 hours) at 04/28/15 1312 Last data filed at 04/28/15 1218  Gross per 24 hour  Intake 1825.34 ml  Output   4095 ml  Net -2269.66 ml   Exam: General: No acute respiratory distress -  alert and bright - affect cheerful  Lungs: Clear to auscultation bilaterally w/o wheeze - distant HS  Cardiovascular:  regular rate and rhythm without murmur or gallup  Abdomen: Nontender, nondistended, soft, bowel sounds positive, no rebound, no ascites, no appreciable mass Extremities:  1+ edema left upper extremity with L hand dressed/dry, right lower extremity with stable 2+ edema - multiple areas of nonviable skin across digits right foot without severe surrounding erythema   Data Reviewed: Basic Metabolic Panel:  Recent Labs Lab 04/22/15 0532 04/23/15 0420 04/24/15 0647 04/25/15 0355 04/27/15 0445 04/28/15 0426  NA 138 136  --  135 136 134*  K 3.3* 3.4*  --  3.6 3.7 3.6  CL 102 99*  --  98* 99* 97*  CO2 30 29  --  GLUCOSE 103* 98  --  97 98  106*  BUN 7 8  --  8 5* 6  CREATININE 0.93 0.97  --  0.94 0.85 0.93  CALCIUM 7.7* 7.6*  --  7.7* 7.9* 7.7*  MG 1.8 1.6* 2.0  --   --   --   PHOS  --   --   --   --  3.6  --     CBC:  Recent Labs Lab 04/24/15 0647 04/25/15 0355 04/26/15 0403 04/27/15 0445 04/28/15 0426  WBC 7.9 9.2 8.6 8.1 8.0  NEUTROABS 5.5 6.0 5.7 5.2 5.1  HGB 8.5* 8.7* 8.4* 8.2* 8.3*  HCT 27.9* 28.0* 27.1* 26.2* 26.3*  MCV 88.0 87.8 88.0 87.9 87.1  PLT 414* 426* 398 378 372    Liver Function Tests:  Recent Labs Lab 04/22/15 0532 04/23/15 0420 04/25/15 0355 04/27/15 0445  AST 55* 60* 36  --   ALT 17 20 13*  --   ALKPHOS 136* 156* 133*  --  BILITOT 0.6 0.9 0.5  --   PROT 6.3* 6.3* 6.3*  --   ALBUMIN 1.4* 1.5* 1.5* 1.6*   Coags:  Recent Labs Lab 04/22/15 0532 04/23/15 0810 04/24/15 0647  APTT 74* 68* 68*    Studies:   Recent x-ray studies have been reviewed in detail by the Attending Physician  Scheduled Meds:  Scheduled Meds: .  ceFAZolin (ANCEF) IV  2 g Intravenous 3 times per day  . docusate sodium  100 mg Oral BID  . enoxaparin (LOVENOX) injection  1 mg/kg Subcutaneous Q12H  . feeding supplement (ENSURE ENLIVE)  237 mL Oral TID BM  . metoprolol tartrate  50 mg Oral BID  . morphine  90 mg Oral 2 times per day  . naloxegol oxalate  25 mg Oral Daily  . sodium chloride  3 mL Intravenous Q12H  . venlafaxine XR  75 mg Oral Q breakfast  . warfarin  5 mg Oral ONCE-1800  . Warfarin - Pharmacist Dosing Inpatient   Does not apply q1800    Time spent on care of this patient: 35 mins   Carolee Channell A , MD   [facility] Office  [phone] Pager - Text Page per Amion as per below:  On-Call/Text Page:      [email or web address]      password [email or web address]  If 7PM-7AM, please contact night-coverage [email or web address] Password [email or web address] 04/28/2015, 1:12 PM   LOS: 21 days

## 2015-04-28 NOTE — Progress Notes (Signed)
Rapid Response RN, Hella at bedside w/Dr Arthor Captain and myself. Pt administered adenosine  iv push. PTS HRcoverted from SVT 170 to SR 108. Pt tolerated well. VSS.Marland Kitchen HR 108 bp 106/67, o2 sat 97 on RA. Crash cart at bedside. Will cont to monitor . Pt on tele.

## 2015-04-28 NOTE — Progress Notes (Signed)
ANTICOAGULATION CONSULT NOTE  Pharmacy Consult for Warfarin Indication: R foot thromboembolism  Allergies  Allergen Reactions  . Imitrex [Sumatriptan] Other (See Comments)    Chest pain, 2004 (tolerates 50 mg prn)  . Verapamil Other (See Comments)    Causes pvc's   Patient Measurements: Height:  (190.5 cm) Weight: 228 lb (103.42 kg) IBW/kg (Calculated) : 84.5  Vital Signs: Temp: 98.8 F (37.1 C) (10/02 0600) Temp Source: Oral (10/02 0600) BP: 125/100 mmHg (10/02 0600) Pulse Rate: 112 (10/02 0659)  Labs:  Recent Labs  04/26/15 0403 04/27/15 0445 04/28/15 0426  HGB 8.4* 8.2* 8.3*  HCT 27.1* 26.2* 26.3*  PLT 398 378 372  LABPROT 18.8* 21.5* 22.3*  INR 1.57* 1.87* 1.97*  CREATININE  --  0.85 0.93   Estimated Creatinine Clearance: 114.2 mL/min (by C-G formula based on Cr of 0.93).  Medications:  Prescriptions prior to admission  Medication Sig Dispense Refill Last Dose  . albuterol (PROAIR HFA) 108 (90 BASE) MCG/ACT inhaler Inhale 1 puff into the lungs every 6 (six) hours as needed for wheezing or shortness of breath.    04/06/2015 at Unknown time  . atenolol (TENORMIN) 100 MG tablet Take 100 mg by mouth daily.   2-3 days ago at usually around 6am  . atorvastatin (LIPITOR) 80 MG tablet Take 80 mg by mouth daily.   few days ago  . clonazePAM (KLONOPIN) 2 MG tablet Take 2 mg by mouth 4 (four) times daily.   04/07/2015 at 1400  . Diclofenac-Misoprostol 75-0.2 MG TBEC Take 1 tablet by mouth 2 (two) times daily. Arthrotec  0 04/06/2015 at pm  . famciclovir (FAMVIR) 500 MG tablet Take 500 mg by mouth 2 (two) times daily as needed (fever blisters).    1-2 weeks ago  . ibuprofen (ADVIL,MOTRIN) 800 MG tablet Take 1 tablet (800 mg total) by mouth 3 (three) times daily. (Patient taking differently: Take 800 mg by mouth 2 (two) times daily as needed (pain). ) 21 tablet 0 3 weeks ago  . morphine (MS CONTIN) 30 MG 12 hr tablet Take 30 mg by mouth every 12 (twelve) hours. Take with a  60 mg tablet for a 90 mg dose  0 04/06/2015 at 900  . morphine (MS CONTIN) 60 MG 12 hr tablet Take 60 mg by mouth every 12 (twelve) hours. Take with a 30 mg tablet for a 90 mg dose  0 04/06/2015 at 900  . naloxegol oxalate (MOVANTIK) 25 MG TABS tablet Take 25 mg by mouth 2 (two) times daily.   04/05/2015  . ondansetron (ZOFRAN) 8 MG tablet Take 8 mg by mouth daily as needed for nausea or vomiting.   0 04/06/2015 at Unknown time  . OVER THE COUNTER MEDICATION Place 1 drop into both eyes 2 (two) times daily as needed (dry eyes). OTC lubricant eye drops   2-3 weeks ago  . oxycodone (ROXICODONE) 30 MG immediate release tablet Take 30 mg by mouth every 6 (six) hours. scheduled   04/06/2015 at pm  . SUMAtriptan (IMITREX) 50 MG tablet Take 50 mg by mouth daily as needed for migraine or headache. Maximum 2 doses in 2 days  0 couple weeks ago  . testosterone cypionate (DEPOTESTOSTERONE CYPIONATE) 200 MG/ML injection Inject 400 mg into the muscle once a week. On Wednesday  0 04/03/2015  . trifluridine (VIROPTIC) 1 % ophthalmic solution Place 1 drop into both eyes 2 (two) times daily as needed (breakouts (blurry, matted eyelids)).   3 weeks ago  .  venlafaxine XR (EFFEXOR-XR) 37.5 MG 24 hr capsule Take 37.5 mg by mouth daily with breakfast.   04/04/2015  . colchicine 0.6 MG tablet Take 1 tablet (0.6 mg total) by mouth 2 (two) times daily. (Patient not taking: Reported on 04/07/2015) 14 tablet 0 Not Taking at Unknown time  . oxyCODONE-acetaminophen (PERCOCET/ROXICET) 5-325 MG per tablet Take 1 tablet by mouth every 6 (six) hours as needed for severe pain. (Patient not taking: Reported on 04/07/2015) 10 tablet 0 Not Taking at Unknown time   Scheduled:  .  ceFAZolin (ANCEF) IV  2 g Intravenous 3 times per day  . docusate sodium  100 mg Oral BID  . enoxaparin (LOVENOX) injection  1 mg/kg Subcutaneous Q12H  . feeding supplement (ENSURE ENLIVE)  237 mL Oral TID BM  . metoprolol tartrate  50 mg Oral BID  . morphine  90 mg Oral  2 times per day  . naloxegol oxalate  25 mg Oral Daily  . sodium chloride  3 mL Intravenous Q12H  . venlafaxine XR  75 mg Oral Q breakfast  . Warfarin - Pharmacist Dosing Inpatient   Does not apply q1800   Infusions:  . sodium chloride 10 mL/hr at 04/26/15 1846   Assessment: 59yo male with long hospital course of heparin followed by argatroban and now lovenox for R foot thromboembolism. Pharmacy is consulted to dose warfarin for VTE treatment (day 5 of overlap with lovenox). INR 1.97 with trend up nicely.  His CBC is (low) but stable and no noted bleeding complications.  Plans for VAC change on 10/4 noted.  Current Lovenox dose is at /kg q12hr which is treatment dosing.  His renal function remains normal.  Goal of Therapy:  INR 2-3 Monitor platelets by anticoagulation protocol: Yes   Plan:  Continue warfarin  tonight x1 Continue lovenox /kg subcutaneously q12h Daily INR/CBC  Nadara Mustard, PharmD., MS Clinical Pharmacist Pager:  [phone] Thank you for allowing pharmacy to be part of this patients care team. 04/28/2015 10:17 AM

## 2015-04-28 NOTE — Progress Notes (Signed)
pts ekg indicates SVT. Notified dr Arthor Captain. Awaiting orders.

## 2015-04-28 NOTE — Progress Notes (Signed)
Pt HR >150 sustaining earlier his HR was NSR, on call MD Dr. Claiborne Billings informed and ordered metoprolo 2.5 mg IV and IVF - NSS 500 ml bolus given and rechecked HR 112, pt alert and responsive no s/s of pain, also given morphine and clonazepam will continue to monitor.

## 2015-04-28 NOTE — Progress Notes (Signed)
Patient in SVT rate 170s.  Dr Arthor Captain at bedside.   Adenosine given Rapid IV push.  HR slowed into 60s then stabilized ST 110s-120. Patient tolerated well.  Alert and Oriented.  RN to call if assistance needed.

## 2015-04-28 NOTE — Progress Notes (Signed)
Pt HR >170 sustained. Pt states he is feeling "anxious".  Notified MD. EKG ordered. Cont to monitor.

## 2015-04-29 DIAGNOSIS — A4102 Sepsis due to Methicillin resistant Staphylococcus aureus: Secondary | ICD-10-CM

## 2015-04-29 DIAGNOSIS — B958 Unspecified staphylococcus as the cause of diseases classified elsewhere: Secondary | ICD-10-CM

## 2015-04-29 LAB — BASIC METABOLIC PANEL
Anion gap: 7 (ref 5–15)
BUN: <5 mg/dL — ABNORMAL LOW (ref 6–20)
CHLORIDE: 99 mmol/L — AB (ref 101–111)
CO2: 30 mmol/L (ref 22–32)
CREATININE: 0.81 mg/dL (ref 0.61–1.24)
Calcium: 7.9 mg/dL — ABNORMAL LOW (ref 8.9–10.3)
GFR calc non Af Amer: >60 mL/min (ref >60)
Glucose, Bld: 99 mg/dL (ref 65–99)
POTASSIUM: 3.6 mmol/L (ref 3.5–5.1)
SODIUM: 136 mmol/L (ref 135–145)

## 2015-04-29 LAB — PROTIME-INR
INR: 1.98 — ABNORMAL HIGH (ref 0.00–1.49)
Prothrombin Time: 22.4 seconds — ABNORMAL HIGH (ref 11.6–15.2)

## 2015-04-29 LAB — CBC WITH DIFFERENTIAL/PLATELET
BASOS ABS: 0 10*3/uL (ref 0.0–0.1)
Basophils Relative: 0 %
EOS PCT: 3 %
Eosinophils Absolute: 0.3 10*3/uL (ref 0.0–0.7)
HEMATOCRIT: 25.7 % — AB (ref 39.0–52.0)
Hemoglobin: 8.1 g/dL — ABNORMAL LOW (ref 13.0–17.0)
LYMPHS ABS: 1.6 10*3/uL (ref 0.7–4.0)
LYMPHS PCT: 17 %
MCH: 27.6 pg (ref 26.0–34.0)
MCHC: 31.5 g/dL (ref 30.0–36.0)
MCV: 87.7 fL (ref 78.0–100.0)
MONO ABS: 1.4 10*3/uL — AB (ref 0.1–1.0)
Monocytes Relative: 14 %
NEUTROS ABS: 6.3 10*3/uL (ref 1.7–7.7)
Neutrophils Relative %: 66 %
PLATELETS: 382 10*3/uL (ref 150–400)
RBC: 2.93 MIL/uL — AB (ref 4.22–5.81)
RDW: 15.6 % — ABNORMAL HIGH (ref 11.5–15.5)
WBC: 9.5 10*3/uL (ref 4.0–10.5)

## 2015-04-29 MED ORDER — FUROSEMIDE 10 MG/ML IJ SOLN
40.0000 mg | Freq: Once | INTRAMUSCULAR | Status: AC
  Administered 2015-04-29: 40 mg via INTRAVENOUS
  Filled 2015-04-29: qty 4

## 2015-04-29 MED ORDER — METOPROLOL TARTRATE 50 MG PO TABS
75.0000 mg | ORAL_TABLET | Freq: Two times a day (BID) | ORAL | Status: DC
  Administered 2015-04-29 – 2015-04-30 (×2): 75 mg via ORAL
  Filled 2015-04-29 (×2): qty 1

## 2015-04-29 MED ORDER — POTASSIUM CHLORIDE CRYS ER 20 MEQ PO TBCR
40.0000 meq | EXTENDED_RELEASE_TABLET | Freq: Once | ORAL | Status: AC
  Administered 2015-04-29: 40 meq via ORAL
  Filled 2015-04-29: qty 2

## 2015-04-29 MED ORDER — WARFARIN SODIUM 5 MG PO TABS
7.5000 mg | ORAL_TABLET | Freq: Once | ORAL | Status: AC
  Administered 2015-04-29: 7.5 mg via ORAL
  Filled 2015-04-29: qty 2

## 2015-04-29 NOTE — Progress Notes (Signed)
Patients staples removed from abdomen and upper right thigh. Staples still intact underneath wound vac dressing. Will remove once dressing is changed.

## 2015-04-29 NOTE — Progress Notes (Signed)
PROGRESS NOTE  [name]:[id number] DOB: Apr 13, 1956 DOA: 04/06/2015 PCP: [name], MD  Admit HPI / Brief Narrative: 59 y.o. Male with PUD, HLD, history of MRSA abscesses, peripheral neuropathy, and chronic pain who presented to [facility] with severe left hand pain. He was found to be septic with acute kidney injury and left hand cellulitis. After admission he was noted to have a dusky swollen right foot. The patient reported that approximately 2 weeks prior he started feeling poorly and eating less. 9/7 he could not feel his right foot and fell as a result. He landed on his left wrist, shoulder, and back side. Since then his left hand has been very painful and his wrist and arm had begun to swell. He has developed thick erythematous streaking on his left arm.    In the ER: Lactic acid was 4.16, INR 2.26 (not on anticoag), PTT 24.7, WBC 31.3, mag 1.3, potassium 2.7, procalcitonin 9.72. BP 80/68 He received 3L of IV bolus in the ER.  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 Reportedly the patient injected his groin with testosterone 3 weeks prior.  HPI/Subjective Feels okay, denies any complaints, still runs low grade temp of 99.7. Continue ceftaz per ID recommendations, seen by PRS, they will replace the wound VAC in a.m. Still await CIR recommendation regarding admit. Still have some swelling and heart rate marginal at 100, will increase metoprolol to 75 mg, give 40 mg of IV Lasix.   Assessment/Plan:  Severe sepsis - MSSA bacteremia  -suspect his initial site was the R groin injection site  -Patient was hypotensive, tachycardic, with a white count of 31.3 on initial workup = sepsis  -Continue antibiotics as per ID for total of 6 weeks -ID recommended TEE on able to, cardiology recommended to continue antibiotics without TEE. -TTE did not show valvular insufficiency or abscess, doubt TEE is going to show anything will needs  intervention -Patient already getting 6 weeks of antibiotics so is if vegetation seen it's already covered by antibiotics.   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 has evaluated the foot and feels watchful waiting is appropriate and wishes to see the patient in follow-up in his office - ongoing wound care per Plastic Surgery service  PT/OT to evaluate and treat. Patient on CIR  R femoral vein disruption/DVT - severe R LE / groin edema  In OR 9/23 became clear femoral vein involved in infection, and fully thrombosed - Vasc Surgery oversewed the divided vessel at the time of surgery 9/23 - edema appears to be slowly improving  Acute blood loss anemia Due to infected vascular region s/p multiple surgeries + sepsis - transfuse as needed to keep Hgb 7.0 or > - stable today  Sinus tachycardia > SVT > [facility]  Cardiology has completed an evaluation and recommends adenosine 6 mg should the patient suffer another episode of atrial tachycardia. SVT, received 6 mg of IV adenosine again on 10/2, converted to sinus tachycardia. Started on metoprolol.  Acute renal failure Creatinine is 0.9 at baseline - 4.4 on admission - has now normalized /  recovered   L arm swelling - Left hand cellulitis with radius fracture - history of MRSA abscess Evaluated by [name] initially, w/ his plan being for the pt to follow-up in his office in approximately 2 weeks - swelling of arm noted 9/23 - venous duplex 9/24 w/o evidence of DVT - s/p Incision and Drainage of L Hand Abscess 9/27 - ongoing care per Hand Surgery. Try to decrease the anasarca, Lasix.    Possible pyelonephritis Urinalysis suggestive of infection - adequately covered with broad-spectrum empiric antibiotics - urine culture not helpful   Elevated INR with coagulopathy Felt to be due to sepsis - resolved   Hypokalemia and hypomagnesemia Recurrent - replace as needed   Chronic pain syndrome/chronic periods use has been on pain medications for 12 years - managed by Dr. [name] [facility]?)  Back on his home dose of MS Contin 90 mg twice a day and OxyIR 30 mg every 6 hours as needed.   Code Status: FULL Family Communication: spoke w/ pt and wife at bedside  Disposition Plan: transfer to tele bed - Plastics attending to groin wound / Hand Surgery to L hand - PT/OT - ask Cards for TEE early next week   Consultants: Hand Surgery Vascular Surgery  ID Cardiology - [facility] Plastic surgery Orthopedics - [name]   Antibiotics: Zosyn 9/10 > 9/12 Vancomycin 9/10 > 9/15 Cefazolin 9/14 >  DVT prophylaxis: lovenox > coumadin    Objective: Blood pressure 194/87, pulse 100, temperature 99.7 F (37.6 C), temperature source Oral, resp. rate 18, height  (1.905 m), weight 103.42 kg (228 lb), SpO2 100 %.  Intake/Output Summary (Last 24 hours) at 04/29/15 1435 Last data filed at 04/29/15 1300  Gross per 24 hour  Intake   3323 ml  Output   1685 ml  Net   1638 ml   Exam: General: No acute respiratory distress -  alert and bright - affect cheerful  Lungs: Clear to auscultation bilaterally w/o wheeze - distant HS  Cardiovascular:  regular rate and rhythm without murmur or gallup  Abdomen: Nontender, nondistended, soft, bowel sounds positive, no rebound, no ascites, no appreciable mass Extremities:  1+ edema left upper extremity with L hand dressed/dry, right lower extremity with stable 2+ edema - multiple areas of nonviable skin across digits right foot without severe surrounding erythema   Data Reviewed: Basic Metabolic Panel:  Recent Labs Lab 04/23/15 0420  04/24/15 0647 04/25/15 0355 04/27/15 0445 04/28/15 0426 04/29/15 0642  NA 136  --  135 136 134* 136  K 3.4*  --  3.6 3.7 3.6 3.6  CL 99*  --  98* 99* 97* 99*  CO2 29  --  GLUCOSE 98  --  97 98 106* 99  BUN 8  --  8 5* 6 <5*  CREATININE 0.97  --  0.94 0.85 0.93 0.81  CALCIUM 7.6*  --  7.7* 7.9* 7.7* 7.9*  MG 1.6* 2.0  --   --   --   --   PHOS  --   --   --  3.6  --   --     CBC:  Recent Labs Lab 04/25/15 0355 04/26/15 0403 04/27/15 0445 04/28/15 0426 04/29/15 0642  WBC 9.2 8.6 8.1 8.0 9.5  NEUTROABS 6.0 5.7 5.2 5.1 6.3  HGB 8.7* 8.4* 8.2* 8.3* 8.1*  HCT 28.0* 27.1* 26.2* 26.3* 25.7*  MCV 87.8 88.0 87.9 87.1 87.7  PLT 426* 398 378 372 382  Liver Function Tests:  Recent Labs Lab 04/23/15 0420 04/25/15 0355 04/27/15 0445  AST 60* 36  --   ALT 20 13*  --   ALKPHOS 156* 133*  --   BILITOT 0.9 0.5  --   PROT 6.3* 6.3*  --   ALBUMIN 1.5* 1.5* 1.6*   Coags:  Recent Labs Lab 04/23/15 0810 04/24/15 0647  APTT 68* 68*    Studies:   Recent x-ray studies have been reviewed in detail by the Attending Physician  Scheduled Meds:  Scheduled Meds: .  ceFAZolin (ANCEF) IV  2 g Intravenous 3 times per day  . docusate sodium  100 mg Oral BID  . enoxaparin (LOVENOX) injection  1 mg/kg Subcutaneous Q12H  . feeding supplement (ENSURE ENLIVE)  237 mL Oral TID BM  . metoprolol tartrate  50 mg Oral BID  . morphine  90 mg Oral 2 times per day  . naloxegol oxalate  25 mg Oral Daily  . sodium chloride  3 mL Intravenous Q12H  . venlafaxine XR  75 mg Oral Q breakfast  . warfarin  7.5 mg Oral ONCE-1800  . Warfarin - Pharmacist Dosing Inpatient   Does not apply q1800    Time spent on care of this patient: 35 mins   Joziah Dollins A , MD   [facility] Office  [phone] Pager - Text Page per Amion as per below:  On-Call/Text Page:      [email or web address]      password [email or web address]  If 7PM-7AM, please contact night-coverage [email or web address] Password  [email or web address] 04/29/2015, 2:35 PM   LOS: 22 days

## 2015-04-29 NOTE — Progress Notes (Signed)
Physical Therapy Treatment Patient Details Name: [name] MRN: [id number] DOB: 04-29-1956 Today's Date: 04/29/2015    History of Present Illness [name] is a 59 y.o. male who presents to the Emergency Department complaining of gradually worsening, constant, 8/10 pain to left hand s/p fall that occurred 3.5 days ago.   MRI showed L distal radius fx.  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  Sepsis has been the working dx and pt has been to OR x3 for I and D and revision of thigh wound and most recently for a rectus abdominus muscle flap to right groin on 9/23.  Management of L hand and R thigh wounds continues.    PT Comments    Pt was seen for evaluation of his transfers with assistance to get bed to chair of 2 person assist.  Pt is propped with elevated LE's duet to his height and for comfort, and will be getting back with lift with nursing.  Pt is very motivated but is quite weak and an excellent candidate for CIR.  Follow Up Recommendations  CIR     Equipment Recommendations  None recommended by PT    Recommendations for Other Services Rehab consult     Precautions / Restrictions Precautions Precautions: Fall Precaution Comments: no specific weight bearing order but avoided weight bearing through L wrist Restrictions Weight Bearing Restrictions: No    Mobility  Bed Mobility Overal bed mobility: Needs Assistance;+2 for physical assistance;+ 2 for safety/equipment Bed Mobility: Supine to Sit     Supine to sit: Min assist;+2 for physical assistance;+2 for safety/equipment;Mod assist;HOB elevated        Transfers Overall transfer level: Needs assistance Equipment used: 2 person hand held assist Transfers: Sit to/from [employer] Sit to Stand: Mod assist;+2 physical assistance;+2 safety/equipment;From  elevated surface (blocked knees) Stand pivot transfers: Mod assist;+2 physical assistance;+2 safety/equipment;From elevated surface       General transfer comment: assisted to shift forward and to block knees  Ambulation/Gait                 Stairs            Wheelchair Mobility    Modified Rankin (Stroke Patients Only)       Balance Overall balance assessment: Needs assistance Sitting-balance support: Feet supported;Single extremity supported Sitting balance-Leahy Scale: Fair Sitting balance - Comments: controls with tendency to use L arm but asked him to refrain   Standing balance support: Bilateral upper extremity supported Standing balance-Leahy Scale: Poor Standing balance comment: pain in R foot is a hindrance                    Cognition Arousal/Alertness: Awake/alert Behavior During Therapy: WFL for tasks assessed/performed Overall Cognitive Status: Within Functional Limits for tasks assessed                      Exercises General Exercises - Upper Extremity Shoulder Flexion: AROM;10 reps;Supine;Left Elbow Flexion: AROM;10 reps;Supine;Left Digit Composite Flexion: AROM;PROM;Left;Supine Composite Extension: AROM;PROM;10 reps;Supine    General Comments        Pertinent Vitals/Pain Pain Assessment: Faces Faces Pain Scale: Hurts even more Pain Location: R foot and L arm Pain Descriptors / Indicators: Aching Pain Intervention(s): Limited activity within patient's tolerance;Monitored during session;Repositioned  Home Living                      Prior Function            PT Goals (current goals can now be found in the care plan section) Acute Rehab PT Goals Patient Stated Goal: back home independent Progress towards PT goals: Progressing toward goals    Frequency  Min 3X/week    PT Plan Current plan remains appropriate    Co-evaluation             End of Session   Activity Tolerance: Patient  tolerated treatment well;Patient limited by pain Patient left: in chair;with call bell/phone within reach;with family/visitor present     Time: 1345-1413 PT Time Calculation (min) (ACUTE ONLY): 28 min  Charges:  $Therapeutic Activity: 23-37 mins                    G Codes:      Ivar Drape 05/29/2015, 2:35 PM   Samul Dada, PT MS Acute Rehab Dept. Number: [facility] [id number] and [facility] [phone]

## 2015-04-29 NOTE — Consult Note (Signed)
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 right handed male history of hypertension, chronic knee pain, peptic ulcer disease, MRSA abscesses, peripheral neuropathy. Independent with occasional cane and walker prior to admission living with wife, in a home with facilities on the first floor and 4 steps to enter. Presented 04/07/2015 with recent fall 04/03/2015 with increasing left hand and wrist pain. In the ED noted to be hypotensive tachycardia and white blood cell count of 31,300. Placed on broad-spectrum anti-biotic's. MRI of left hand show dorsal distal radius transverse fracture, diffuse edema of the hand likely reactive to distal radius fracture, tear of the dorsal band of the scaphounate ligament. Multiple small deep fluid collections in the hands compatible with hematoma question superimposed infection. CT of right femur due to pain showed a 7.4 cm process involving or adjacent to the proximal right SFA question aneurysm, pseudoaneurysm or hematoma. 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 Imogene Burn. Follow-up infectious disease blood cultures positive bacillus/MSSA and remains on anti-bowel therapy is advised. Patient returned to the OR 04/13/2015 for a repeat incision and drainage of thigh abscess negative pressure dressing placement as well as washout of right thigh abscess cavity 04/15/2015 and 04/17/2015 with rectus flap procedure 04/19/2015 per plastic surgery Dr Leta Baptist and wound VAC change 04/23/2015. . TTE did not show valvular insufficiency or abscess. Hospital course tachycardia/SVT (requiring adenosine) with cardiology services consulted Dr. Jacinto Halim. Echocardiogram with ejection fraction of  60% grade 1 diastolic dysfunction. In regards to abscess of left dorsal hand underwent incision and drainage 04/23/2015 per [name]. Vascular study left upper extremity shows no DVT. Patient developed gangrenous thromboembolic injury to the right foot with consult [name]. No plan for invasive procedures at this time with wound care nurse follow-up for skin care. Patient placed on Coumadin for thromboembolism as per [name]. Physical and occupational therapy evaluations completed 04/24/2015 with recommendations of physical medicine rehabilitation consult.   Review of Systems  Constitutional: Negative for fever and chills.  HENT: Negative for hearing loss.   Eyes: Negative for blurred vision and double vision.  Respiratory: Negative for cough.   Cardiovascular: Positive for leg swelling. Negative for chest pain and palpitations.  Gastrointestinal: Negative for nausea and vomiting.  Genitourinary: Negative for dysuria and hematuria.  Musculoskeletal: Positive for myalgias, joint pain and falls.       Chronic knee pain  Skin: Negative for rash.  Neurological: Positive for weakness and headaches. Negative for seizures and loss of consciousness.  All other systems reviewed and are negative.  Past Medical History  Diagnosis Date  . Hypertension   . Chronic knee pain   . Hyperlipemia   . PUD (peptic ulcer disease)    Past Surgical History  Procedure Laterality Date  . I&d extremity Left 07/12/2013    Procedure: I&D Left Thigh Abscess;  Surgeon: [name], MD;  Location: [facility] OR;  Service: Orthopedics;  Laterality: Left;  . Replacement total knee bilateral    . Tonsillectomy    . Hernia repair    . Cholecystectomy    . Appendectomy    . Shoulder surgery    . Elbow surgery    . Femoral artery exploration Right 04/11/2015    Procedure: Resection of infected right femoral artery aneurysm;  Surgeon: Fransisco Hertz, MD;  Location: [facility] OR;  Service: Vascular;  Laterality: Right;  . Incision  and drainage abscess Right 04/11/2015    Procedure: INCISION AND DRAINAGE OF RIGHT THIGH ABSCESS;  Surgeon: Fransisco Hertz, MD;  Location: [facility] OR;  Service: Vascular;  Laterality: Right;  . Incision and drainage abscess Right 04/13/2015    Procedure: INCISION AND DRAINAGE THIGH ABSCESS;  Surgeon: Fransisco Hertz, MD;  Location: [facility] OR;  Service: Vascular;  Laterality: Right;  . I&d extremity Left 04/17/2015    Procedure: IRRIGATION AND DEBRIDEMENT LEFT EXTREMITY;  Surgeon: Fransisco Hertz, MD;  Location: [facility] OR;  Service: Vascular;  Laterality: Left;  Marland Kitchen Muscle flap closure Right 04/19/2015    Procedure: RIGHT RECTUS ABDOMINUS  FLAP TO RIGHT GROIN;  Surgeon: Glenna Fellows, MD;  Location: [facility] OR;  Service: Plastics;  Laterality: Right;  . Femoral revision Right 04/19/2015    Procedure: OVERSEW RIGHT FEMORAL VEIN ;  Surgeon: Chuck Hint, MD;  Location: [facility] OR;  Service: Vascular;  Laterality: Right;  . Wound exploration Right 04/23/2015    Procedure: EXAM UNDER ANESTHESIA AND VAC CHANGE RIGHT THIGH;  Surgeon: Glenna Fellows, MD;  Location: [facility] OR;  Service: Plastics;  Laterality: Right;   Family History  Problem Relation Age of Onset  . Heart attack Paternal Grandmother 35  . Heart disease Mother 69    arrythmia  . Stroke Paternal Grandfather   . Stroke Maternal Grandfather    Social History:  reports that he has quit smoking. His smoking use included Cigarettes. He does not have any smokeless tobacco history on file. He reports that he does not drink alcohol or use illicit drugs. Allergies:  Allergies  Allergen Reactions  . Imitrex [Sumatriptan] Other (See Comments)    Chest pain, 2004 (tolerates 50 mg prn)  . Verapamil Other (See Comments)    Causes pvc's   Medications Prior to Admission  Medication Sig Dispense Refill  . albuterol (PROAIR HFA) 108 (90 BASE) MCG/ACT inhaler Inhale 1 puff into the lungs every 6 (six) hours as needed for wheezing or shortness of breath.     Marland Kitchen atenolol  (TENORMIN) 100 MG tablet Take 100 mg by mouth daily.    Marland Kitchen atorvastatin (LIPITOR) 80 MG tablet Take 80 mg by mouth daily.    . clonazePAM (KLONOPIN) 2 MG tablet Take 2 mg by mouth 4 (four) times daily.    . Diclofenac-Misoprostol 75-0.2 MG TBEC Take 1 tablet by mouth 2 (two) times daily. Arthrotec  0  . famciclovir (FAMVIR) 500 MG tablet Take 500 mg by mouth 2 (two) times daily as needed (fever blisters).     Marland Kitchen ibuprofen (ADVIL,MOTRIN) 800 MG tablet Take 1 tablet (800 mg total) by mouth 3 (three) times daily. (Patient taking differently: Take 800 mg by mouth 2 (two) times daily as needed (pain). ) 21 tablet 0  . morphine (MS CONTIN) 30 MG 12 hr tablet Take 30 mg by mouth every 12 (twelve) hours. Take with a 60 mg tablet for a 90 mg dose  0  . morphine (MS CONTIN) 60 MG 12 hr tablet Take 60 mg by mouth every 12 (twelve) hours. Take with a 30 mg tablet for a 90 mg dose  0  . naloxegol oxalate (MOVANTIK) 25 MG TABS tablet Take 25 mg by mouth 2 (two) times daily.    . ondansetron (ZOFRAN) 8 MG tablet Take 8 mg by mouth daily as needed for nausea or vomiting.   0  .  OVER THE COUNTER MEDICATION Place 1 drop into both eyes 2 (two) times daily as needed (dry eyes). OTC lubricant eye drops    . oxycodone (ROXICODONE) 30 MG immediate release tablet Take 30 mg by mouth every 6 (six) hours. scheduled    . SUMAtriptan (IMITREX) 50 MG tablet Take 50 mg by mouth daily as needed for migraine or headache. Maximum 2 doses in 2 days  0  . testosterone cypionate (DEPOTESTOSTERONE CYPIONATE) 200 MG/ML injection Inject 400 mg into the muscle once a week. On Wednesday  0  . trifluridine (VIROPTIC) 1 % ophthalmic solution Place 1 drop into both eyes 2 (two) times daily as needed (breakouts (blurry, matted eyelids)).    Marland Kitchen venlafaxine XR (EFFEXOR-XR) 37.5 MG 24 hr capsule Take 37.5 mg by mouth daily with breakfast.    . colchicine 0.6 MG tablet Take 1 tablet (0.6 mg total) by mouth 2 (two) times daily. (Patient not taking:  Reported on 04/07/2015) 14 tablet 0  . oxyCODONE-acetaminophen (PERCOCET/ROXICET) 5-325 MG per tablet Take 1 tablet by mouth every 6 (six) hours as needed for severe pain. (Patient not taking: Reported on 04/07/2015) 10 tablet 0    Home: Home Living Family/patient expects to be discharged to:: Private residence Living Arrangements: Spouse/significant other, Children (6 y.o. grandson) Available Help at Discharge: Family, Available 24 hours/day Type of Home: House Home Access: Stairs to enter [employer] of Steps: 4 Entrance Stairs-Rails: Right, Left Home Layout: Two level, 1/2 bath on main level Bathroom Shower/Tub: Walk-in shower (upstairs) Bathroom Toilet: Standard Home Equipment: Environmental consultant - 2 wheels, Cane - single point, Bedside commode  Functional History: Prior Function Level of Independence: Independent Comments: pt is on disability, former Doctor, hospital Status:  Mobility: Bed Mobility Overal bed mobility: Needs Assistance Bed Mobility: Supine to Sit Supine to sit: Min assist, HOB elevated (use of rail) Sit to supine: Mod assist, +2 for physical assistance General bed mobility comments: pt much more aggressive with his assist today. Transfers Overall transfer level: Needs assistance Equipment used:  (chairback for standing assist) Transfers: Sit to/from Stand, [employer] Transfers Sit to Stand: Mod assist, +2 physical assistance Squat pivot transfers: Mod assist General transfer comment: assist to come forward and initial support at R knee      ADL: ADL Overall ADL's : Needs assistance/impaired Eating/Feeding: Set up, Bed level Grooming: Brushing hair, Oral care, Sitting, Supervision/safety Upper Body Bathing: Sitting, Moderate assistance Lower Body Bathing: Total assistance, +2 for physical assistance, Bed level Upper Body Dressing : Sitting, Moderate assistance Upper Body Dressing Details (indicate cue type and reason): instructed to  dress L UE first Lower Body Dressing: +2 for physical assistance, Total assistance, Bed level  Cognition: Cognition Overall Cognitive Status: Within Functional Limits for tasks assessed Orientation Level: Oriented X4 Cognition Arousal/Alertness: Awake/alert Behavior During Therapy: WFL for tasks assessed/performed Overall Cognitive Status: Within Functional Limits for tasks assessed  Blood pressure 163/73, pulse 96, temperature 99.5 F (37.5 C), temperature source Oral, resp. rate 18, height  (1.905 m), weight 103.42 kg (228 lb), SpO2 97 %. Physical Exam  Vitals reviewed. Constitutional: He is oriented to person, place, and time. He appears well-developed and well-nourished.  HENT:  Head: Normocephalic and atraumatic.  Eyes: Conjunctivae and EOM are normal.  Neck: Normal range of motion. Neck supple. No thyromegaly present.  Cardiovascular: Normal rate and regular rhythm.   Respiratory: Effort normal and breath sounds normal. No respiratory distress.  GI: Soft. Bowel sounds are normal. He exhibits no distension.  Genitourinary:  +Foley  Musculoskeletal: He exhibits edema (LUE).  RUE 5/5 LUE 3+/5 grossly LLE 5/5  RLE 2/5 hip flexion, 3/5 ankle dorsi/plantar  flexion  Neurological: He is alert and oriented to person, place, and time.  Skin: Skin is warm and dry.  Right foot dressing in placed was some dried blood on the dressing.  RLE with VAC and drain in place Left upper extremity wrist dressing in place appropriately tender    Results for orders placed or performed during the hospital encounter of 04/06/15 (from the past 24 hour(s))  CBC with Differential/Platelet     Status: Abnormal   Collection Time: 04/29/15  6:42 AM  Result Value Ref Range   WBC 9.5 4.0 - 10.5 K/uL   RBC 2.93 (L) 4.22 - 5.81 MIL/uL   Hemoglobin 8.1 (L) 13.0 - 17.0 g/dL   HCT 16.1 (L) 09.6 - 04.5 %   MCV 87.7 78.0 - 100.0 fL   MCH 27.6 26.0 - 34.0 pg   MCHC 31.5 30.0 - 36.0 g/dL   RDW 40.9  (H) 81.1 - 15.5 %   Platelets 382 150 - 400 K/uL   Neutrophils Relative % 66 %   Neutro Abs 6.3 1.7 - 7.7 K/uL   Lymphocytes Relative 17 %   Lymphs Abs 1.6 0.7 - 4.0 K/uL   Monocytes Relative 14 %   Monocytes Absolute 1.4 (H) 0.1 - 1.0 K/uL   Eosinophils Relative 3 %   Eosinophils Absolute 0.3 0.0 - 0.7 K/uL   Basophils Relative 0 %   Basophils Absolute 0.0 0.0 - 0.1 K/uL  Protime-INR     Status: Abnormal   Collection Time: 04/29/15  6:42 AM  Result Value Ref Range   Prothrombin Time 22.4 (H) 11.6 - 15.2 seconds   INR 1.98 (H) 0.00 - 1.49  Basic metabolic panel     Status: Abnormal   Collection Time: 04/29/15  6:42 AM  Result Value Ref Range   Sodium 136 135 - 145 mmol/L   Potassium 3.6 3.5 - 5.1 mmol/L   Chloride 99 (L) 101 - 111 mmol/L   CO2 30 22 - 32 mmol/L   Glucose, Bld 99 65 - 99 mg/dL   BUN <5 (L) 6 - 20 mg/dL   Creatinine, Ser 9.14 0.61 - 1.24 mg/dL   Calcium 7.9 (L) 8.9 - 10.3 mg/dL   GFR calc non Af Amer >60 >60 mL/min   GFR calc Af Amer >60 >60 mL/min   Anion gap 7 5 - 15   No results found.  Assessment/Plan: Diagnosis: Sepsis 1. Does the need for close, 24 hr/day medical supervision in concert with the patient's rehab needs make it unreasonable for this patient to be served in a less intensive setting? Yes 2. Co-Morbidities requiring supervision/potential complications: SVT, chronic pain, VTE, muscle flap 3. Due to bladder management, safety, skin/wound care, disease management, medication administration, pain management and patient education, does the patient require 24 hr/day rehab nursing? Yes 4. Does the patient require coordinated care of a physician, rehab nurse, PT (1-2 hrs/day, 5 days/week) and OT (1-2 hrs/day, 5 days/week) to address physical and functional deficits in the context of the above medical diagnosis(es)? Yes Addressing deficits in the following areas: balance, endurance, locomotion, strength, transferring, bowel/bladder control, bathing,  dressing, grooming, toileting and psychosocial support 5. Can the patient actively participate in an intensive therapy program of at least 3 hrs of therapy per day at least 5 days per week? Potentially 6. The potential for patient  to make measurable gains while on inpatient rehab is good 7. Anticipated functional outcomes upon discharge from inpatient rehab are min assist  with PT, supervision and min assist with OT, n/a with SLP. 8. Estimated rehab length of stay to reach the above functional goals is: 13-17 days 9. Does the patient have adequate social supports and living environment to accommodate these discharge functional goals? Potentially 10. Anticipated D/C setting: Home 11. Anticipated post D/C treatments: HH therapy and Home excercise program 12. Overall Rehab/Functional Prognosis: good  RECOMMENDATIONS: This patient's condition is appropriate for continued rehabilitative care in the following setting: CIR Patient has agreed to participate in recommended program. Yes Note that insurance prior authorization may be required for reimbursement for recommended care.  Comment: Rehab Admissions Coordinator to follow up, with focus on stabilization of HR, improvement in pain control, evidence of ability to tolerate 3 hours therapy/day, and confirmation of social support with physical assistance.    04/29/2015

## 2015-04-29 NOTE — Progress Notes (Signed)
POD# 10 rectus flap to right groin/thigh, over sewing femoral vein  Temp:  [99.5 F (37.5 C)-100.5 F (38.1 C)] 99.5 F (37.5 C) (10/03 0503) Pulse Rate:  [96-108] 96 (10/03 0503) Resp:  [18-20] 18 (10/03 0503) BP: (106-163)/(67-74) 163/73 mmHg (10/03 0503) SpO2:  [96 %-99 %] 97 % (10/03 0503)   JP thigh 40 cc yesterday, 70 cc day prior VAC drainage has not been recorded since transfer to floor On Lovenox, warfarin   Events noted- episode SVT converted  PE Abdomen dressing dry, Right groin dressing changed, incision dry   RLE proximal thigh : VAC intact, no cellulitis, decreasing edema thigh Drain minimal serosanguinous with decreased edema fluid drainage around drain exit site  VAC with watery serosanguinous drainage   A/P S/p rectus muscle flap to prior abscess cavity over stumps of vessels  Dry dressing to abdomen and right groin every other day and PRN- d/c staples abdomen today  Will change VAC again 10/4, can consider switching to dressings at that time, will have edema fluid drainage from skin that may necessitate frequent dressing changes.   Glenna Fellows, MD [facility]astic [facility] Surgery [phone]

## 2015-04-29 NOTE — Progress Notes (Signed)
ANTICOAGULATION CONSULT NOTE  Pharmacy Consult for Warfarin Indication: R foot thromboembolism  Allergies  Allergen Reactions  . Imitrex [Sumatriptan] Other (See Comments)    Chest pain, 2004 (tolerates 50 mg prn)  . Verapamil Other (See Comments)    Causes pvc's   Patient Measurements: Height:  (190.5 cm) Weight: 228 lb (103.42 kg) IBW/kg (Calculated) : 84.5  Vital Signs: Temp: 99.5 F (37.5 C) (10/03 0503) Temp Source: Oral (10/03 0503) BP: 163/73 mmHg (10/03 0503) Pulse Rate: 96 (10/03 0503)  Labs:  Recent Labs  04/27/15 0445 04/28/15 0426 04/29/15 0642  HGB 8.2* 8.3* 8.1*  HCT 26.2* 26.3* 25.7*  PLT 378 372 382  LABPROT 21.5* 22.3* 22.4*  INR 1.87* 1.97* 1.98*  CREATININE 0.85 0.93 0.81   Estimated Creatinine Clearance: 131.1 mL/min (by C-G formula based on Cr of 0.81).  Medications:  Prescriptions prior to admission  Medication Sig Dispense Refill Last Dose  . albuterol (PROAIR HFA) 108 (90 BASE) MCG/ACT inhaler Inhale 1 puff into the lungs every 6 (six) hours as needed for wheezing or shortness of breath.    04/06/2015 at Unknown time  . atenolol (TENORMIN) 100 MG tablet Take 100 mg by mouth daily.   2-3 days ago at usually around 6am  . atorvastatin (LIPITOR) 80 MG tablet Take 80 mg by mouth daily.   few days ago  . clonazePAM (KLONOPIN) 2 MG tablet Take 2 mg by mouth 4 (four) times daily.   04/07/2015 at 1400  . Diclofenac-Misoprostol 75-0.2 MG TBEC Take 1 tablet by mouth 2 (two) times daily. Arthrotec  0 04/06/2015 at pm  . famciclovir (FAMVIR) 500 MG tablet Take 500 mg by mouth 2 (two) times daily as needed (fever blisters).    1-2 weeks ago  . ibuprofen (ADVIL,MOTRIN) 800 MG tablet Take 1 tablet (800 mg total) by mouth 3 (three) times daily. (Patient taking differently: Take 800 mg by mouth 2 (two) times daily as needed (pain). ) 21 tablet 0 3 weeks ago  . morphine (MS CONTIN) 30 MG 12 hr tablet Take 30 mg by mouth every 12 (twelve) hours. Take with a 60  mg tablet for a 90 mg dose  0 04/06/2015 at 900  . morphine (MS CONTIN) 60 MG 12 hr tablet Take 60 mg by mouth every 12 (twelve) hours. Take with a 30 mg tablet for a 90 mg dose  0 04/06/2015 at 900  . naloxegol oxalate (MOVANTIK) 25 MG TABS tablet Take 25 mg by mouth 2 (two) times daily.   04/05/2015  . ondansetron (ZOFRAN) 8 MG tablet Take 8 mg by mouth daily as needed for nausea or vomiting.   0 04/06/2015 at Unknown time  . OVER THE COUNTER MEDICATION Place 1 drop into both eyes 2 (two) times daily as needed (dry eyes). OTC lubricant eye drops   2-3 weeks ago  . oxycodone (ROXICODONE) 30 MG immediate release tablet Take 30 mg by mouth every 6 (six) hours. scheduled   04/06/2015 at pm  . SUMAtriptan (IMITREX) 50 MG tablet Take 50 mg by mouth daily as needed for migraine or headache. Maximum 2 doses in 2 days  0 couple weeks ago  . testosterone cypionate (DEPOTESTOSTERONE CYPIONATE) 200 MG/ML injection Inject 400 mg into the muscle once a week. On Wednesday  0 04/03/2015  . trifluridine (VIROPTIC) 1 % ophthalmic solution Place 1 drop into both eyes 2 (two) times daily as needed (breakouts (blurry, matted eyelids)).   3 weeks ago  . venlafaxine XR (  EFFEXOR-XR) 37.5 MG 24 hr capsule Take 37.5 mg by mouth daily with breakfast.   04/04/2015  . colchicine 0.6 MG tablet Take 1 tablet (0.6 mg total) by mouth 2 (two) times daily. (Patient not taking: Reported on 04/07/2015) 14 tablet 0 Not Taking at Unknown time  . oxyCODONE-acetaminophen (PERCOCET/ROXICET) 5-325 MG per tablet Take 1 tablet by mouth every 6 (six) hours as needed for severe pain. (Patient not taking: Reported on 04/07/2015) 10 tablet 0 Not Taking at Unknown time   Scheduled:  .  ceFAZolin (ANCEF) IV  2 g Intravenous 3 times per day  . docusate sodium  100 mg Oral BID  . enoxaparin (LOVENOX) injection  1 mg/kg Subcutaneous Q12H  . feeding supplement (ENSURE ENLIVE)  237 mL Oral TID BM  . metoprolol tartrate  50 mg Oral BID  . morphine  90 mg Oral 2  times per day  . naloxegol oxalate  25 mg Oral Daily  . sodium chloride  3 mL Intravenous Q12H  . venlafaxine XR  75 mg Oral Q breakfast  . warfarin  7.5 mg Oral ONCE-1800  . Warfarin - Pharmacist Dosing Inpatient   Does not apply q1800   Infusions:  . sodium chloride 10 mL/hr at 04/26/15 1846   Assessment: 59yo male with long hospital course of heparin followed by argatroban and now lovenox for R foot thromboembolism. Pharmacy is consulted to dose warfarin for VTE treatment (day 6 of overlap with lovenox). INR did not really budge from yesterday and is at 1.98. His CBC is (low) but stable and no noted bleeding complications. Current Lovenox dose is at /kg q12hr which is treatment dosing.  His renal function remains normal.  Goal of Therapy:  INR 2-3 Monitor platelets by anticoagulation protocol: Yes   Plan:  Give coumadin 7.5mg  PO x 1 today Continue Enoxaparin 105 mg [place] BID  Monitor daily INR, CBC, s/s of bleed Educate when more stable   Consider stopping lovenox bridge today or tomorrow  Enzo Bi, PharmD Clinical Pharmacist Pager [phone] 04/29/2015 10:22 AM

## 2015-04-29 NOTE — H&P (Signed)
Physical Medicine and Rehabilitation Admission H&P    Chief Complaint  Patient presents with  . Tachycardia  : HPI: [name] is a 59 y.o. right handed male history of hypertension, chronic knee pain, peptic ulcer disease, MRSA abscesses, peripheral neuropathy. Independent with occasional cane and walker prior to admission living with wife, in a home with facilities on the first floor and 4 steps to enter. Presented 04/07/2015 with recent fall 04/03/2015 with increasing left hand and wrist pain. In the ED noted to be hypotensive tachycardia and white blood cell count of 31,300. Placed on broad-spectrum anti-biotic's. MRI of left hand show dorsal distal radius transverse fracture, diffuse edema of the hand likely reactive to distal radius fracture, tear of the dorsal band of the scaphounate ligament. Multiple small deep fluid collections in the hands compatible with hematoma question superimposed infection. CT of right femur due to pain showed a 7.4 cm process involving or adjacent to the proximal right SFA question aneurysm, pseudoaneurysm or hematoma. 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 Bridgett Larsson. Follow-up infectious disease blood cultures positive bacillus/MSSA and remains on broad-spectrum antibiotics as advised. Patient returned to the OR 04/13/2015 for a repeat incision and drainage of thigh abscess negative pressure dressing placement as well as washout of right thigh abscess cavity 04/15/2015 and 04/17/2015 with rectus flap procedure 04/19/2015 per plastic surgery Dr Iran Planas and wound VAC change 04/23/2015. . TTE did not show valvular insufficiency or abscess. Hospital course tachycardia/SVT (requiring adenosine) with cardiology services consulted Dr. Einar Gip. Echocardiogram with ejection fraction of 54% grade 1 diastolic dysfunction. In regards to abscess of left dorsal  hand underwent incision and drainage 04/23/2015 per Dr. Lenon Curt. Vascular study left upper extremity shows no DVT. Patient developed gangrenous thromboembolic injury to the right foot and toes with consult Dr. Sharol Given. No plan for invasive procedures at this time with wound care nurse follow-up for skin care. Patient placed on Coumadin for thromboembolism as per Dr. Sharol Given. Physical and occupational therapy evaluations completed 04/24/2015 with recommendations of physical medicine rehabilitation consult. Patient was admitted for comprehensive rehabilitation program  ROS Constitutional: Negative for fever and chills.  HENT: Negative for hearing loss.  Eyes: Negative for blurred vision and double vision.  Respiratory: Negative for cough.  Cardiovascular: Positive for leg swelling. Negative for chest pain and palpitations.  Gastrointestinal: Negative for nausea and vomiting.  Genitourinary: Negative for dysuria and hematuria.  Musculoskeletal: Positive for myalgias, joint pain and falls.   Chronic knee pain  Skin: Negative for rash.  Neurological: Positive for weakness and headaches. Negative for seizures and loss of consciousness.  All other systems reviewed and are negative   Past Medical History  Diagnosis Date  . Hypertension   . Chronic knee pain   . Hyperlipemia   . PUD (peptic ulcer disease)    Past Surgical History  Procedure Laterality Date  . I&d extremity Left 07/12/2013    Procedure: I&D Left Thigh Abscess;  Surgeon: Wylene Simmer, MD;  Location: [place];  Service: Orthopedics;  Laterality: Left;  . Replacement total knee bilateral    . Tonsillectomy    . Hernia repair    . Cholecystectomy    . Appendectomy    . Shoulder surgery    . Elbow surgery    . Femoral artery exploration Right 04/11/2015    Procedure: Resection of infected right femoral artery aneurysm;  Surgeon: Conrad Alpine, MD;  Location: [facility];  Service: Vascular;  Laterality: Right;  . Incision and drainage  abscess Right 04/11/2015    Procedure: INCISION AND DRAINAGE OF RIGHT THIGH ABSCESS;  Surgeon: Conrad Montalvin Manor, MD;  Location: [place];  Service: Vascular;  Laterality: Right;  . Incision and drainage abscess Right 04/13/2015    Procedure: INCISION AND DRAINAGE THIGH ABSCESS;  Surgeon: Conrad Dodge Center, MD;  Location: [place];  Service: Vascular;  Laterality: Right;  . I&d extremity Left 04/17/2015    Procedure: IRRIGATION AND DEBRIDEMENT LEFT EXTREMITY;  Surgeon: Conrad Hales [place]ers, MD;  Location: [place];  Service: Vascular;  Laterality: Left;  Marland Kitchen Muscle flap closure Right 04/19/2015    Procedure: RIGHT RECTUS ABDOMINUS  FLAP TO RIGHT GROIN;  Surgeon: Irene Limbo, MD;  Location: [place];  Service: Plastics;  Laterality: Right;  . Femoral revision Right 04/19/2015    Procedure: OVERSEW RIGHT FEMORAL VEIN ;  Surgeon: Angelia Mould, MD;  Location: [place];  Service: Vascular;  Laterality: Right;  . Wound exploration Right 04/23/2015    Procedure: EXAM UNDER ANESTHESIA AND VAC CHANGE RIGHT THIGH;  Surgeon: Irene Limbo, MD;  Location: [place];  Service: Plastics;  Laterality: Right;   Family History  Problem Relation Age of Onset  . Heart attack Paternal Grandmother 52  . Heart disease Mother 18    arrythmia  . Stroke Paternal Grandfather   . Stroke Maternal Grandfather    Social History:  reports that he has quit smoking. His smoking use included Cigarettes. He does not have any smokeless tobacco history on file. He reports that he does not drink alcohol or use illicit drugs. Allergies:  Allergies  Allergen Reactions  . Imitrex [Sumatriptan] Other (See Comments)    Chest pain, 2004 (tolerates 50 mg prn)  . Verapamil Other (See Comments)    Causes pvc's   Medications Prior to Admission  Medication Sig Dispense Refill  . albuterol (PROAIR HFA) 108 (90 BASE) MCG/ACT inhaler Inhale 1 puff into the lungs every 6 (six) hours as needed for wheezing or shortness of breath.     Marland Kitchen atenolol (TENORMIN) 100 MG  tablet Take 100 mg by mouth daily.    Marland Kitchen atorvastatin (LIPITOR) 80 MG tablet Take 80 mg by mouth daily.    . clonazePAM (KLONOPIN) 2 MG tablet Take 2 mg by mouth 4 (four) times daily.    . Diclofenac-Misoprostol 75-0.2 MG TBEC Take 1 tablet by mouth 2 (two) times daily. Arthrotec  0  . famciclovir (FAMVIR) 500 MG tablet Take 500 mg by mouth 2 (two) times daily as needed (fever blisters).     Marland Kitchen ibuprofen (ADVIL,MOTRIN) 800 MG tablet Take 1 tablet (800 mg total) by mouth 3 (three) times daily. (Patient taking differently: Take 800 mg by mouth 2 (two) times daily as needed (pain). ) 21 tablet 0  . morphine (MS CONTIN) 30 MG 12 hr tablet Take 30 mg by mouth every 12 (twelve) hours. Take with a 60 mg tablet for a 90 mg dose  0  . morphine (MS CONTIN) 60 MG 12 hr tablet Take 60 mg by mouth every 12 (twelve) hours. Take with a 30 mg tablet for a 90 mg dose  0  . naloxegol oxalate (MOVANTIK) 25 MG TABS tablet Take 25 mg by mouth 2 (two) times daily.    . ondansetron (ZOFRAN) 8 MG tablet Take 8 mg by mouth daily as needed for nausea or vomiting.   0  . OVER THE COUNTER MEDICATION  Place 1 drop into both eyes 2 (two) times daily as needed (dry eyes). OTC lubricant eye drops    . oxycodone (ROXICODONE) 30 MG immediate release tablet Take 30 mg by mouth every 6 (six) hours. scheduled    . SUMAtriptan (IMITREX) 50 MG tablet Take 50 mg by mouth daily as needed for migraine or headache. Maximum 2 doses in 2 days  0  . testosterone cypionate (DEPOTESTOSTERONE CYPIONATE) 200 MG/ML injection Inject 400 mg into the muscle once a week. On Wednesday  0  . trifluridine (VIROPTIC) 1 % ophthalmic solution Place 1 drop into both eyes 2 (two) times daily as needed (breakouts (blurry, matted eyelids)).    . venlafaxine XR (EFFEXOR-XR) 37.5 MG 24 hr capsule Take 37.5 mg by mouth daily with breakfast.    . colchicine 0.6 MG tablet Take 1 tablet (0.6 mg total) by mouth 2 (two) times daily. (Patient not taking: Reported on  04/07/2015) 14 tablet 0  . oxyCODONE-acetaminophen (PERCOCET/ROXICET) 5-325 MG per tablet Take 1 tablet by mouth every 6 (six) hours as needed for severe pain. (Patient not taking: Reported on 04/07/2015) 10 tablet 0    Home: Home Living Family/patient expects to be discharged to:: Private residence Living Arrangements: Spouse/significant other, Children (6 y.o. grandson) Available Help at Discharge: Family, Available 24 hours/day Type of Home: House Home Access: Stairs to enter Entrance Stairs-Number of Steps: 4 Entrance Stairs-Rails: Right, Left Home Layout: Two level, 1/2 bath on main level Bathroom Shower/Tub: Walk-in shower (upstairs) Bathroom Toilet: Standard Home Equipment: Walker - 2 wheels, Cane - single point, Bedside commode   Functional History: Prior Function Level of Independence: Independent Comments: pt is on disability, former pharmaceutical salesman  Functional Status:  Mobility: Bed Mobility Overal bed mobility: Needs Assistance Bed Mobility: Supine to Sit Supine to sit: Min assist, HOB elevated (use of rail) Sit to supine: Mod assist, +2 for physical assistance General bed mobility comments: pt much more aggressive with his assist today. Transfers Overall transfer level: Needs assistance Equipment used:  (chairback for standing assist) Transfers: Sit to/from Stand, Squat Pivot Transfers Sit to Stand: Mod assist, +2 physical assistance Squat pivot transfers: Mod assist General transfer comment: assist to come forward and initial support at R knee      ADL: ADL Overall ADL's : Needs assistance/impaired Eating/Feeding: Set up, Bed level Grooming: Brushing hair, Oral care, Sitting, Supervision/safety Upper Body Bathing: Sitting, Moderate assistance Lower Body Bathing: Total assistance, +2 for physical assistance, Bed level Upper Body Dressing : Sitting, Moderate assistance Upper Body Dressing Details (indicate cue type and reason): instructed to dress L  UE first Lower Body Dressing: +2 for physical assistance, Total assistance, Bed level General ADL Comments: Performed retrograde massage to L UE and elevated on 3 pillows.  Instructed pt to remind staff to elevate L UE when positioning pt in bed or chair.  Cognition: Cognition Overall Cognitive Status: Within Functional Limits for tasks assessed Orientation Level: Oriented X4 Cognition Arousal/Alertness: Awake/alert Behavior During Therapy: WFL for tasks assessed/performed Overall Cognitive Status: Within Functional Limits for tasks assessed  Physical Exam: Blood pressure 163/73, pulse 96, temperature 99.5 F (37.5 C), temperature source Oral, resp. rate 18, height 6' 3" (1.905 m), weight 103.42 kg (228 lb), SpO2 97 %. Physical Exam Constitutional: He is oriented to person, place, and time. He appears well-developed and well-nourished.  HENT:  Head: Normocephalic and atraumatic.  Eyes: Conjunctivae and EOM are normal.  Neck: Normal range of motion. Neck supple. No thyromegaly present.  Cardiovascular:   Normal rate and regular rhythm.  Respiratory: Effort normal and breath sounds normal. No respiratory distress.  GI: Soft. Bowel sounds are normal. He exhibits no distension.  Genitourinary:  +Foley  Musculoskeletal: He exhibits edema (LUE) (improving).  RUE 5/5 LUE 3+/5 shoulder abduction, elbow flex/ext; 3/5 finger grip LLE 5/5  RLE 2/5 hip flexion, 2/5 ankle dorsi/plantar flexion  Neurological: He is alert and oriented to person, place, and time.  Skin: Skin is warm and dry.  Right foot dressing in placed was some dried blood on the dressing.  RLE with VAC and drain in place Left upper extremity wrist dressing in place appropriately tender   Results for orders placed or performed during the hospital encounter of 04/06/15 (from the past 48 hour(s))  CBC with Differential/Platelet     Status: Abnormal   Collection Time: 04/28/15  4:26 AM  Result Value Ref Range   WBC 8.0 4.0  - 10.5 K/uL   RBC 3.02 (L) 4.22 - 5.81 MIL/uL   Hemoglobin 8.3 (L) 13.0 - 17.0 g/dL   HCT 26.3 (L) 39.0 - 52.0 %   MCV 87.1 78.0 - 100.0 fL   MCH 27.5 26.0 - 34.0 pg   MCHC 31.6 30.0 - 36.0 g/dL   RDW 15.6 (H) 11.5 - 15.5 %   Platelets 372 150 - 400 K/uL   Neutrophils Relative % 63 %   Neutro Abs 5.1 1.7 - 7.7 K/uL   Lymphocytes Relative 17 %   Lymphs Abs 1.3 0.7 - 4.0 K/uL   Monocytes Relative 16 %   Monocytes Absolute 1.3 (H) 0.1 - 1.0 K/uL   Eosinophils Relative 3 %   Eosinophils Absolute 0.2 0.0 - 0.7 K/uL   Basophils Relative 1 %   Basophils Absolute 0.0 0.0 - 0.1 K/uL  Protime-INR     Status: Abnormal   Collection Time: 04/28/15  4:26 AM  Result Value Ref Range   Prothrombin Time 22.3 (H) 11.6 - 15.2 seconds   INR 1.97 (H) 0.00 - 1.49  Basic metabolic panel     Status: Abnormal   Collection Time: 04/28/15  4:26 AM  Result Value Ref Range   Sodium 134 (L) 135 - 145 mmol/L   Potassium 3.6 3.5 - 5.1 mmol/L   Chloride 97 (L) 101 - 111 mmol/L   CO2 31 22 - 32 mmol/L   Glucose, Bld 106 (H) 65 - 99 mg/dL   BUN 6 6 - 20 mg/dL   Creatinine, Ser 0.93 0.61 - 1.24 mg/dL   Calcium 7.7 (L) 8.9 - 10.3 mg/dL   GFR calc non Af Amer >60 >60 mL/min   GFR calc Af Amer >60 >60 mL/min    Comment: (NOTE) The eGFR has been calculated using the CKD EPI equation. This calculation has not been validated in all clinical situations. eGFR's persistently <60 mL/min signify possible Chronic Kidney Disease.    Anion gap 6 5 - 15  Urinalysis, Routine w reflex microscopic (not at [facility])     Status: Abnormal   Collection Time: 04/28/15  9:20 AM  Result Value Ref Range   Color, Urine YELLOW YELLOW   APPearance CLOUDY (A) CLEAR   Specific Gravity, Urine 1.013 1.005 - 1.030   pH 7.5 5.0 - 8.0   Glucose, UA NEGATIVE NEGATIVE mg/dL   Hgb urine dipstick LARGE (A) NEGATIVE   Bilirubin Urine NEGATIVE NEGATIVE   Ketones, ur NEGATIVE NEGATIVE mg/dL   Protein, ur 100 (A) NEGATIVE mg/dL   Urobilinogen,  UA 1.0 0.0 -   1.0 mg/dL   Nitrite NEGATIVE NEGATIVE   Leukocytes, UA NEGATIVE NEGATIVE  Urine microscopic-add on     Status: Abnormal   Collection Time: 04/28/15  9:20 AM  Result Value Ref Range   WBC, UA 3-6 <3 WBC/hpf   RBC / HPF TOO NUMEROUS TO COUNT <3 RBC/hpf   Casts HYALINE CASTS (A) NEGATIVE    Comment: WBC CAST  CBC with Differential/Platelet     Status: Abnormal   Collection Time: 04/29/15  6:42 AM  Result Value Ref Range   WBC 9.5 4.0 - 10.5 K/uL   RBC 2.93 (L) 4.22 - 5.81 MIL/uL   Hemoglobin 8.1 (L) 13.0 - 17.0 g/dL   HCT 25.7 (L) 39.0 - 52.0 %   MCV 87.7 78.0 - 100.0 fL   MCH 27.6 26.0 - 34.0 pg   MCHC 31.5 30.0 - 36.0 g/dL   RDW 15.6 (H) 11.5 - 15.5 %   Platelets 382 150 - 400 K/uL   Neutrophils Relative % 66 %   Neutro Abs 6.3 1.7 - 7.7 K/uL   Lymphocytes Relative 17 %   Lymphs Abs 1.6 0.7 - 4.0 K/uL   Monocytes Relative 14 %   Monocytes Absolute 1.4 (H) 0.1 - 1.0 K/uL   Eosinophils Relative 3 %   Eosinophils Absolute 0.3 0.0 - 0.7 K/uL   Basophils Relative 0 %   Basophils Absolute 0.0 0.0 - 0.1 K/uL  Protime-INR     Status: Abnormal   Collection Time: 04/29/15  6:42 AM  Result Value Ref Range   Prothrombin Time 22.4 (H) 11.6 - 15.2 seconds   INR 1.98 (H) 0.00 - 9.41  Basic metabolic panel     Status: Abnormal   Collection Time: 04/29/15  6:42 AM  Result Value Ref Range   Sodium 136 135 - 145 mmol/L   Potassium 3.6 3.5 - 5.1 mmol/L   Chloride 99 (L) 101 - 111 mmol/L   CO2 30 22 - 32 mmol/L   Glucose, Bld 99 65 - 99 mg/dL   BUN <5 (L) 6 - 20 mg/dL   Creatinine, Ser 0.81 0.61 - 1.24 mg/dL   Calcium 7.9 (L) 8.9 - 10.3 mg/dL   GFR calc non Af Amer >60 >60 mL/min   GFR calc Af Amer >60 >60 mL/min    Comment: (NOTE) The eGFR has been calculated using the CKD EPI equation. This calculation has not been validated in all clinical situations. eGFR's persistently <60 mL/min signify possible Chronic Kidney Disease.    Anion gap 7 5 - 15   No results  found.     Medical Problem List and Plan: 1. Functional deficits secondary to debilitation/sepsis/multi-medical/MRSA abscesses with multiple irrigation and debridements with rectus flap procedure 04/19/2015 2.  DVT Prophylaxis/Anticoagulation: Coumadin for thromboembolism. Monitor for any bleeding episodes 3. Pain Management: MS Contin 90 mg twice a day, oxycodone for breakthrough pain. Monitor with increased mobility 4. Mood: Effexor XR 75 mg daily, Klonopin 0.5 mg 4 times a day as needed 5. Neuropsych: This patient is capable of making decisions on his own behalf. 6. Skin/Wound Care: Skin care is supervised to right foot. Follow-up outpatient with Dr. Sharol Given 7. Fluids/Electrolytes/Nutrition: Strict I and O's with follow-up chemistries 8.ID. Presently on Ancef 2 g 3 times a day. Will confirm duration of antibodies with infectious disease 9. Tachycardia SVT. Follow-up cardiology services. Continue Lopressor 50 mg twice a day for now 10. Constipation. Laxitive  assistance   Post Admission Physician Evaluation: 1. Functional deficits secondary  to debilitation/sepsis/multi-medical/MRSA abscesses with multiple irrigation and debridements with rectus flap procedure 04/19/2015. 2. Patient is admitted to receive collaborative, interdisciplinary care between the physiatrist, rehab nursing staff, and therapy team. 3. Patient's level of medical complexity and substantial therapy needs in context of that medical necessity cannot be provided at a lesser intensity of care such as a SNF. 4. Patient has experienced substantial functional loss from his/her baseline which was documented above under the "Functional History" and "Functional Status" headings.  Judging by the patient's diagnosis, physical exam, and functional history, the patient has potential for functional progress which will result in measurable gains while on inpatient rehab.  These gains will be of substantial and practical use upon discharge   in facilitating mobility and self-care at the household level. 5. Physiatrist will provide 24 hour management of medical needs as well as oversight of the therapy plan/treatment and provide guidance as appropriate regarding the interaction of the two. 6. 24 hour rehab nursing will assist with bladder management, safety, skin/wound care, disease management, medication administration, pain management and patient education  and help integrate therapy concepts, techniques,education, etc. 7. PT will assess and treat for/with: Lower extremity strength, range of motion, stamina, balance, functional mobility, safety, adaptive techniques and equipment, with focus on strengthening RLE.   Goals are: Min A/Supervision. 8. OT will assess and treat for/with: ADL's, functional mobility, safety, upper extremity strength, adaptive techniques and equipment, focusing on improving strength in LUE.   Goals are: supervision/Mod I. Therapy may not proceed with showering this patient. 9. Case Management and Social Worker will assess and treat for psychological issues and discharge planning. 10. Team conference will be held weekly to assess progress toward goals and to determine barriers to discharge. 11. Patient will receive at least 3 hours of therapy per day at least 5 days per week. 12. ELOS: 13-17 days       13. Prognosis:  good  Delice Lesch, MD 04/29/2015

## 2015-04-29 NOTE — Consult Note (Addendum)
WOC wound consult note Plastitics team following for flap wound.  Ortho/Hand service following for hand wounds. Consult requested for right gangrenous toes. Dr Lajoyce Corners of the ortho service performed a consult for right foot on 9/26 and recommended conservative measures of watchful waiting and compression stockings. Topical treatment will not be effective for gangrene/eschar areas. Dr Lajoyce Corners plans to follow as an outpatient according to progress notes. Please refer to ortho team if further questions regarding plan of care for right foot. Please re-consult if further assistance is needed.  Thank-you,  Cammie Mcgee MSN, RN, CWOCN, [place], CNS [phone]

## 2015-04-29 NOTE — Progress Notes (Signed)
Occupational Therapy Treatment Patient Details Name: [name] MRN: [id number] DOB: 09-06-1955 Today's Date: 04/29/2015    History of present illness [name] is a 59 y.o. male who presents to the Emergency Department complaining of gradually worsening, constant, 8/10 pain to left hand s/p fall that occurred 3.5 days ago.   MRI showed L distal radius fx.  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  Sepsis has been the working dx and pt has been to OR x3 for I and D and revision of thigh wound and most recently for a rectus abdominus muscle flap to right groin on 9/23.  Management of L hand and R thigh wounds continues.   OT comments  Focus of session on edema management and A/PROM exercises L UE.  Pt able to verbalize importance of elevating UE and continuing AROM. Pt also demonstrating improved ability to position himself in bed using features of the bed, his L LE and R UE.  Follow Up Recommendations  CIR    Equipment Recommendations  3 in 1 bedside comode    Recommendations for Other Services      Precautions / Restrictions Precautions Precautions: Fall       Mobility Bed Mobility                  Transfers                      Balance                                   ADL                                         General ADL Comments: Performed retrograde massage to L UE and elevated on 3 pillows.  Instructed pt to remind staff to elevate L UE when positioning pt in bed or chair.      Vision                     Perception     Praxis      Cognition   Behavior During Therapy: [facility] for tasks assessed/performed Overall Cognitive Status: Within Functional Limits for tasks assessed                       Extremity/Trunk Assessment                Exercises General Exercises - Upper Extremity Shoulder Flexion: AROM;10 reps;Supine;Left Elbow Flexion: AROM;10 reps;Supine;Left Digit Composite Flexion: AROM;PROM;Left;Supine Composite Extension: AROM;PROM;10 reps;Supine   Shoulder Instructions       General Comments      Pertinent Vitals/ Pain       Pain Assessment: Faces Faces Pain Scale: Hurts even more Pain Location: R hip and LE Pain Descriptors / Indicators: Aching Pain Intervention(s): Monitored during session;Repositioned;Patient requesting pain meds-RN notified  Home Living  Prior Functioning/Environment              Frequency Min 3X/week     Progress Toward Goals  OT Goals(current goals can now be found in the care plan section)  Progress towards OT goals: Progressing toward goals  Acute Rehab OT Goals Patient Stated Goal: back home independent  Plan Discharge plan remains appropriate    Co-evaluation                 End of Session     Activity Tolerance Patient tolerated treatment well   Patient Left in bed;with call bell/phone within reach   Nurse Communication          Time: 1220-1245 OT Time Calculation (min): 25 min  Charges: OT General Charges $OT Visit: 1 Procedure OT Treatments $Therapeutic Exercise: 23-37 mins  Evern Bio 04/29/2015, 12:49 PM [phone]

## 2015-04-29 NOTE — Progress Notes (Signed)
Rehab admissions - I met briefly with patient today.  He lives with his wife who can assist.  I spoke with attending MD.  Patient to have a VAC placed tomorrow.  Will follow up again in am for possible inpatient rehab admission.  Call me for questions.  #[phone]

## 2015-04-30 ENCOUNTER — Inpatient Hospital Stay (HOSPITAL_COMMUNITY)
Admission: RE | Admit: 2015-04-30 | Discharge: 2015-05-08 | DRG: 947 | Disposition: A | Discharge status: Other Acute Inpatient Hospital | Payer: Medicaid Other | Source: Intra-hospital | Attending: Physical Medicine & Rehabilitation | Admitting: Physical Medicine & Rehabilitation

## 2015-04-30 DIAGNOSIS — L02415 Cutaneous abscess of right lower limb: Secondary | ICD-10-CM

## 2015-04-30 DIAGNOSIS — E785 Hyperlipidemia, unspecified: Secondary | ICD-10-CM

## 2015-04-30 DIAGNOSIS — Z79891 Long term (current) use of opiate analgesic: Secondary | ICD-10-CM | POA: Diagnosis not present

## 2015-04-30 DIAGNOSIS — G8929 Other chronic pain: Secondary | ICD-10-CM

## 2015-04-30 DIAGNOSIS — N170 Acute kidney failure with tubular necrosis: Secondary | ICD-10-CM

## 2015-04-30 DIAGNOSIS — Z79899 Other long term (current) drug therapy: Secondary | ICD-10-CM | POA: Diagnosis not present

## 2015-04-30 DIAGNOSIS — F4321 Adjustment disorder with depressed mood: Secondary | ICD-10-CM | POA: Diagnosis not present

## 2015-04-30 DIAGNOSIS — Z7901 Long term (current) use of anticoagulants: Secondary | ICD-10-CM

## 2015-04-30 DIAGNOSIS — I471 Supraventricular tachycardia: Secondary | ICD-10-CM

## 2015-04-30 DIAGNOSIS — G5612 Other lesions of median nerve, left upper limb: Secondary | ICD-10-CM | POA: Diagnosis present

## 2015-04-30 DIAGNOSIS — G894 Chronic pain syndrome: Secondary | ICD-10-CM

## 2015-04-30 DIAGNOSIS — K5900 Constipation, unspecified: Secondary | ICD-10-CM

## 2015-04-30 DIAGNOSIS — Z87891 Personal history of nicotine dependence: Secondary | ICD-10-CM

## 2015-04-30 DIAGNOSIS — I10 Essential (primary) hypertension: Secondary | ICD-10-CM | POA: Diagnosis not present

## 2015-04-30 DIAGNOSIS — I96 Gangrene, not elsewhere classified: Secondary | ICD-10-CM | POA: Diagnosis not present

## 2015-04-30 DIAGNOSIS — M25569 Pain in unspecified knee: Secondary | ICD-10-CM

## 2015-04-30 DIAGNOSIS — I743 Embolism and thrombosis of arteries of the lower extremities: Secondary | ICD-10-CM | POA: Diagnosis not present

## 2015-04-30 DIAGNOSIS — G5721 Lesion of femoral nerve, right lower limb: Secondary | ICD-10-CM | POA: Diagnosis present

## 2015-04-30 DIAGNOSIS — D62 Acute posthemorrhagic anemia: Secondary | ICD-10-CM

## 2015-04-30 DIAGNOSIS — L259 Unspecified contact dermatitis, unspecified cause: Secondary | ICD-10-CM | POA: Diagnosis not present

## 2015-04-30 DIAGNOSIS — I959 Hypotension, unspecified: Secondary | ICD-10-CM

## 2015-04-30 DIAGNOSIS — R52 Pain, unspecified: Secondary | ICD-10-CM

## 2015-04-30 DIAGNOSIS — G629 Polyneuropathy, unspecified: Secondary | ICD-10-CM

## 2015-04-30 DIAGNOSIS — Z96653 Presence of artificial knee joint, bilateral: Secondary | ICD-10-CM | POA: Diagnosis present

## 2015-04-30 DIAGNOSIS — A4101 Sepsis due to Methicillin susceptible Staphylococcus aureus: Secondary | ICD-10-CM | POA: Diagnosis not present

## 2015-04-30 DIAGNOSIS — A4102 Sepsis due to Methicillin resistant Staphylococcus aureus: Secondary | ICD-10-CM

## 2015-04-30 DIAGNOSIS — R339 Retention of urine, unspecified: Secondary | ICD-10-CM | POA: Diagnosis not present

## 2015-04-30 DIAGNOSIS — R5381 Other malaise: Secondary | ICD-10-CM | POA: Diagnosis present

## 2015-04-30 DIAGNOSIS — E43 Unspecified severe protein-calorie malnutrition: Secondary | ICD-10-CM

## 2015-04-30 DIAGNOSIS — A419 Sepsis, unspecified organism: Secondary | ICD-10-CM | POA: Diagnosis present

## 2015-04-30 DIAGNOSIS — Z792 Long term (current) use of antibiotics: Secondary | ICD-10-CM

## 2015-04-30 LAB — CBC WITH DIFFERENTIAL/PLATELET
BASOS PCT: 0 %
Basophils Absolute: 0 10*3/uL (ref 0.0–0.1)
Eosinophils Absolute: 0.2 10*3/uL (ref 0.0–0.7)
Eosinophils Relative: 2 %
HEMATOCRIT: 26.1 % — AB (ref 39.0–52.0)
HEMOGLOBIN: 8.2 g/dL — AB (ref 13.0–17.0)
LYMPHS ABS: 1.9 10*3/uL (ref 0.7–4.0)
LYMPHS PCT: 19 %
MCH: 27.4 pg (ref 26.0–34.0)
MCHC: 31.4 g/dL (ref 30.0–36.0)
MCV: 87.3 fL (ref 78.0–100.0)
MONO ABS: 1.5 10*3/uL — AB (ref 0.1–1.0)
MONOS PCT: 15 %
NEUTROS ABS: 6.3 10*3/uL (ref 1.7–7.7)
Neutrophils Relative %: 64 %
Platelets: 364 10*3/uL (ref 150–400)
RBC: 2.99 MIL/uL — ABNORMAL LOW (ref 4.22–5.81)
RDW: 15.8 % — ABNORMAL HIGH (ref 11.5–15.5)
WBC: 10.1 10*3/uL (ref 4.0–10.5)

## 2015-04-30 LAB — BASIC METABOLIC PANEL
ANION GAP: 6 (ref 5–15)
BUN: 7 mg/dL (ref 6–20)
CHLORIDE: 98 mmol/L — AB (ref 101–111)
CO2: 30 mmol/L (ref 22–32)
Calcium: 7.7 mg/dL — ABNORMAL LOW (ref 8.9–10.3)
Creatinine, Ser: 0.92 mg/dL (ref 0.61–1.24)
GFR calc Af Amer: >60 mL/min (ref >60)
GFR calc non Af Amer: >60 mL/min (ref >60)
GLUCOSE: 104 mg/dL — AB (ref 65–99)
POTASSIUM: 3.6 mmol/L (ref 3.5–5.1)
Sodium: 134 mmol/L — ABNORMAL LOW (ref 135–145)

## 2015-04-30 LAB — PROTIME-INR
INR: 2.1 — ABNORMAL HIGH (ref 0.00–1.49)
Prothrombin Time: 23.4 seconds — ABNORMAL HIGH (ref 11.6–15.2)

## 2015-04-30 MED ORDER — SORBITOL 70 % SOLN
30.0000 mL | Freq: Every day | Status: DC | PRN
  Administered 2015-05-01 – 2015-05-02 (×2): 30 mL via ORAL
  Filled 2015-04-30 (×2): qty 30

## 2015-04-30 MED ORDER — ENSURE ENLIVE PO LIQD
237.0000 mL | Freq: Three times a day (TID) | ORAL | Status: DC
  Administered 2015-04-30 – 2015-05-07 (×13): 237 mL via ORAL

## 2015-04-30 MED ORDER — MORPHINE SULFATE ER 15 MG PO TBCR
90.0000 mg | EXTENDED_RELEASE_TABLET | ORAL | Status: DC
  Administered 2015-04-30 – 2015-05-08 (×16): 90 mg via ORAL
  Filled 2015-04-30 (×16): qty 6

## 2015-04-30 MED ORDER — CLONAZEPAM 0.5 MG PO TABS: 2.0000 mg | ORAL_TABLET | Freq: Two times a day (BID) | ORAL | Status: DC | PRN

## 2015-04-30 MED ORDER — DOCUSATE SODIUM 100 MG PO CAPS
100.0000 mg | ORAL_CAPSULE | Freq: Two times a day (BID) | ORAL | Status: DC
  Administered 2015-04-30 – 2015-05-05 (×11): 100 mg via ORAL
  Filled 2015-04-30 (×12): qty 1

## 2015-04-30 MED ORDER — LEVALBUTEROL HCL 0.63 MG/3ML IN NEBU: 0.6300 mg | INHALATION_SOLUTION | RESPIRATORY_TRACT | Status: DC | PRN

## 2015-04-30 MED ORDER — TRIFLURIDINE 1 % OP SOLN
1.0000 [drp] | Freq: Two times a day (BID) | OPHTHALMIC | Status: DC | PRN
  Filled 2015-04-30: qty 7.5

## 2015-04-30 MED ORDER — NALOXEGOL OXALATE 25 MG PO TABS
25.0000 mg | ORAL_TABLET | Freq: Every day | ORAL | Status: DC
  Administered 2015-05-01 – 2015-05-07 (×7): 25 mg via ORAL
  Filled 2015-04-30 (×9): qty 1

## 2015-04-30 MED ORDER — METOPROLOL TARTRATE 75 MG PO TABS: 75.0000 mg | ORAL_TABLET | Freq: Two times a day (BID) | ORAL | Status: DC

## 2015-04-30 MED ORDER — ONDANSETRON HCL 4 MG/2ML IJ SOLN: 4.0000 mg | Freq: Four times a day (QID) | INTRAMUSCULAR | Status: DC | PRN

## 2015-04-30 MED ORDER — ONDANSETRON HCL 4 MG PO TABS: 4.0000 mg | ORAL_TABLET | Freq: Four times a day (QID) | ORAL | Status: DC | PRN

## 2015-04-30 MED ORDER — WARFARIN - PHARMACIST DOSING INPATIENT: Freq: Every day | Status: DC

## 2015-04-30 MED ORDER — CLONAZEPAM 0.5 MG PO TABS
0.5000 mg | ORAL_TABLET | Freq: Four times a day (QID) | ORAL | Status: DC | PRN
  Administered 2015-04-30 – 2015-05-02 (×6): 0.5 mg via ORAL
  Filled 2015-04-30 (×7): qty 1

## 2015-04-30 MED ORDER — CEFAZOLIN SODIUM-DEXTROSE 2-3 GM-% IV SOLR: 2.0000 g | Freq: Three times a day (TID) | INTRAVENOUS | Status: DC

## 2015-04-30 MED ORDER — VENLAFAXINE HCL ER 75 MG PO CP24
75.0000 mg | ORAL_CAPSULE | Freq: Every day | ORAL | Status: DC
  Administered 2015-05-01 – 2015-05-07 (×7): 75 mg via ORAL
  Filled 2015-04-30 (×9): qty 1

## 2015-04-30 MED ORDER — CEFAZOLIN SODIUM-DEXTROSE 2-3 GM-% IV SOLR
2.0000 g | Freq: Three times a day (TID) | INTRAVENOUS | Status: DC
  Administered 2015-05-01 – 2015-05-05 (×13): 2 g via INTRAVENOUS
  Filled 2015-04-30 (×16): qty 50

## 2015-04-30 MED ORDER — ACETAMINOPHEN 650 MG RE SUPP: 650.0000 mg | Freq: Four times a day (QID) | RECTAL | Status: DC | PRN

## 2015-04-30 MED ORDER — WARFARIN SODIUM 5 MG PO TABS
5.0000 mg | ORAL_TABLET | Freq: Once | ORAL | Status: AC
  Administered 2015-04-30: 5 mg via ORAL

## 2015-04-30 MED ORDER — WARFARIN SODIUM 5 MG PO TABS: 5.0000 mg | ORAL_TABLET | Freq: Once | ORAL | Status: DC

## 2015-04-30 MED ORDER — METOPROLOL TARTRATE 50 MG PO TABS
75.0000 mg | ORAL_TABLET | Freq: Two times a day (BID) | ORAL | Status: DC
  Administered 2015-04-30 – 2015-05-08 (×16): 75 mg via ORAL
  Filled 2015-04-30 (×30): qty 1
  Filled 2015-04-30: qty 2
  Filled 2015-04-30: qty 1

## 2015-04-30 MED ORDER — SODIUM CHLORIDE 0.9 % IJ SOLN
10.0000 mL | INTRAMUSCULAR | Status: DC | PRN
  Administered 2015-04-30: 20 mL
  Administered 2015-05-01 – 2015-05-03 (×5): 10 mL
  Administered 2015-05-03 – 2015-05-04 (×2): 20 mL
  Administered 2015-05-06 (×2): 10 mL
  Administered 2015-05-07: 20 mL
  Administered 2015-05-07 – 2015-05-08 (×3): 10 mL
  Filled 2015-04-30 (×13): qty 40

## 2015-04-30 MED ORDER — OXYCODONE HCL 5 MG PO TABS
30.0000 mg | ORAL_TABLET | Freq: Four times a day (QID) | ORAL | Status: DC | PRN
  Administered 2015-04-30 – 2015-05-08 (×27): 30 mg via ORAL
  Filled 2015-04-30 (×29): qty 6

## 2015-04-30 MED ORDER — ACETAMINOPHEN 325 MG PO TABS
650.0000 mg | ORAL_TABLET | Freq: Four times a day (QID) | ORAL | Status: DC | PRN
  Administered 2015-05-01 – 2015-05-07 (×3): 650 mg via ORAL
  Filled 2015-04-30 (×3): qty 2

## 2015-04-30 MED ORDER — WARFARIN SODIUM 5 MG PO TABS: 5.0000 mg | ORAL_TABLET | Freq: Every day | ORAL | Status: DC

## 2015-04-30 NOTE — Progress Notes (Signed)
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 right handed male history of hypertension, chronic knee pain, peptic ulcer disease, MRSA abscesses, peripheral neuropathy. Independent with occasional cane and walker prior to admission living with wife, in a home with facilities on the first floor and 4 steps to enter. Presented 04/07/2015 with recent fall 04/03/2015 with increasing left hand and wrist pain. In the ED noted to be hypotensive tachycardia and white blood cell count of 31,300. Placed on broad-spectrum anti-biotic's. MRI of left hand show dorsal distal radius transverse fracture, diffuse edema of the hand likely reactive to distal radius fracture, tear of the dorsal band of the scaphounate ligament. Multiple small deep fluid collections in the hands compatible with hematoma question superimposed infection. CT of right femur due to pain showed a 7.4 cm process involving or adjacent to the proximal right SFA question aneurysm, pseudoaneurysm or hematoma. 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 Imogene Burn. Follow-up infectious disease blood cultures positive bacillus/MSSA and remains on anti-bowel therapy is advised. Patient returned to the OR 04/13/2015 for a repeat incision and drainage of thigh abscess negative pressure dressing placement as well as washout of right thigh abscess cavity 04/15/2015 and 04/17/2015 with rectus flap procedure 04/19/2015 per  plastic surgery Dr Leta Baptist and wound VAC change 04/23/2015. . TTE did not show valvular insufficiency or abscess. Hospital course tachycardia/SVT (requiring adenosine) with cardiology services consulted Dr. Jacinto Halim. Echocardiogram with ejection fraction of 60% grade 1 diastolic dysfunction. In regards to abscess of left dorsal hand underwent incision and drainage 04/23/2015 per [name]. Vascular study left upper extremity shows no DVT. Patient developed gangrenous thromboembolic injury to the right foot with consult [name]. No plan for invasive procedures at this time with wound care nurse follow-up for skin care. Patient placed on Coumadin for thromboembolism as per [name]. Physical and occupational therapy evaluations completed 04/24/2015 with recommendations of physical medicine rehabilitation consult.   Review of Systems  Constitutional: Negative for fever and chills.  HENT: Negative for hearing loss.  Eyes: Negative for blurred vision and double vision.  Respiratory: Negative for cough.  Cardiovascular: Positive for leg swelling. Negative for chest pain and palpitations.  Gastrointestinal: Negative for nausea and vomiting.  Genitourinary: Negative for dysuria and hematuria.  Musculoskeletal: Positive for myalgias, joint pain and falls.   Chronic knee pain  Skin: Negative for rash.  Neurological: Positive for weakness and headaches. Negative for seizures and loss of consciousness.  All other systems reviewed and are negative.  Past Medical History  Diagnosis Date  . Hypertension   . Chronic knee pain   . Hyperlipemia   . PUD (peptic ulcer disease)    Past Surgical History  Procedure Laterality Date  . I&d extremity Left 07/12/2013    Procedure: I&D Left Thigh Abscess; Surgeon: [name], MD; Location: [facility] OR; Service: Orthopedics; Laterality: Left;  . Replacement total knee bilateral    . Tonsillectomy    . Hernia repair    .  Cholecystectomy    . Appendectomy    .  Shoulder surgery    . Elbow surgery    . Femoral artery exploration Right 04/11/2015    Procedure: Resection of infected right femoral artery aneurysm; Surgeon: Fransisco Hertz, MD; Location: [facility] OR; Service: Vascular; Laterality: Right;  . Incision and drainage abscess Right 04/11/2015    Procedure: INCISION AND DRAINAGE OF RIGHT THIGH ABSCESS; Surgeon: Fransisco Hertz, MD; Location: [facility] OR; Service: Vascular; Laterality: Right;  . Incision and drainage abscess Right 04/13/2015    Procedure: INCISION AND DRAINAGE THIGH ABSCESS; Surgeon: Fransisco Hertz, MD; Location: [facility] OR; Service: Vascular; Laterality: Right;  . I&d extremity Left 04/17/2015    Procedure: IRRIGATION AND DEBRIDEMENT LEFT EXTREMITY; Surgeon: Fransisco Hertz, MD; Location: [facility] OR; Service: Vascular; Laterality: Left;  Marland Kitchen Muscle flap closure Right 04/19/2015    Procedure: RIGHT RECTUS ABDOMINUS FLAP TO RIGHT GROIN; Surgeon: Glenna Fellows, MD; Location: [facility] OR; Service: Plastics; Laterality: Right;  . Femoral revision Right 04/19/2015    Procedure: OVERSEW RIGHT FEMORAL VEIN ; Surgeon: Chuck Hint, MD; Location: [facility] OR; Service: Vascular; Laterality: Right;  . Wound exploration Right 04/23/2015    Procedure: EXAM UNDER ANESTHESIA AND VAC CHANGE RIGHT THIGH; Surgeon: Glenna Fellows, MD; Location: [facility] OR; Service: Plastics; Laterality: Right;   Family History  Problem Relation Age of Onset  . Heart attack Paternal Grandmother 65  . Heart disease Mother 78    arrythmia  . Stroke Paternal Grandfather   . Stroke Maternal Grandfather    Social History:  reports that he has quit smoking. His smoking use included Cigarettes. He does not have any smokeless tobacco history on file. He reports that he does not drink alcohol or use illicit drugs. Allergies:  Allergies  Allergen Reactions    . Imitrex [Sumatriptan] Other (See Comments)    Chest pain, 2004 (tolerates 50 mg prn)  . Verapamil Other (See Comments)    Causes pvc's   Medications Prior to Admission  Medication Sig Dispense Refill  . albuterol (PROAIR HFA) 108 (90 BASE) MCG/ACT inhaler Inhale 1 puff into the lungs every 6 (six) hours as needed for wheezing or shortness of breath.     Marland Kitchen atenolol (TENORMIN) 100 MG tablet Take 100 mg by mouth daily.    Marland Kitchen atorvastatin (LIPITOR) 80 MG tablet Take 80 mg by mouth daily.    . clonazePAM (KLONOPIN) 2 MG tablet Take 2 mg by mouth 4 (four) times daily.    . Diclofenac-Misoprostol 75-0.2 MG TBEC Take 1 tablet by mouth 2 (two) times daily. Arthrotec  0  . famciclovir (FAMVIR) 500 MG tablet Take 500 mg by mouth 2 (two) times daily as needed (fever blisters).     Marland Kitchen ibuprofen (ADVIL,MOTRIN) 800 MG tablet Take 1 tablet (800 mg total) by mouth 3 (three) times daily. (Patient taking differently: Take 800 mg by mouth 2 (two) times daily as needed (pain). ) 21 tablet 0  . morphine (MS CONTIN) 30 MG 12 hr tablet Take 30 mg by mouth every 12 (twelve) hours. Take with a 60 mg tablet for a 90 mg dose  0  . morphine (MS CONTIN) 60 MG 12 hr tablet Take 60 mg by mouth every 12 (twelve) hours. Take with a 30 mg tablet for a 90 mg dose  0  . naloxegol oxalate (MOVANTIK) 25 MG TABS tablet Take 25 mg by mouth 2 (two) times daily.    . ondansetron (ZOFRAN) 8 MG tablet Take 8 mg by mouth daily as needed for nausea or vomiting.   0  .  OVER THE COUNTER MEDICATION Place 1 drop into both eyes 2 (two) times daily as needed (dry eyes). OTC lubricant eye drops    . oxycodone (ROXICODONE) 30 MG immediate release tablet Take 30 mg by mouth every 6 (six) hours. scheduled    . SUMAtriptan (IMITREX) 50 MG tablet Take 50 mg by mouth daily as needed for migraine or headache. Maximum 2 doses in 2 days  0  . testosterone cypionate  (DEPOTESTOSTERONE CYPIONATE) 200 MG/ML injection Inject 400 mg into the muscle once a week. On Wednesday  0  . trifluridine (VIROPTIC) 1 % ophthalmic solution Place 1 drop into both eyes 2 (two) times daily as needed (breakouts (blurry, matted eyelids)).    Marland Kitchen venlafaxine XR (EFFEXOR-XR) 37.5 MG 24 hr capsule Take 37.5 mg by mouth daily with breakfast.    . colchicine 0.6 MG tablet Take 1 tablet (0.6 mg total) by mouth 2 (two) times daily. (Patient not taking: Reported on 04/07/2015) 14 tablet 0  . oxyCODONE-acetaminophen (PERCOCET/ROXICET) 5-325 MG per tablet Take 1 tablet by mouth every 6 (six) hours as needed for severe pain. (Patient not taking: Reported on 04/07/2015) 10 tablet 0    Home: Home Living Family/patient expects to be discharged to:: Private residence Living Arrangements: Spouse/significant other, Children (6 y.o. grandson) Available Help at Discharge: Family, Available 24 hours/day Type of Home: House Home Access: Stairs to enter [employer] of Steps: 4 Entrance Stairs-Rails: Right, Left Home Layout: Two level, 1/2 bath on main level Bathroom Shower/Tub: Walk-in shower (upstairs) Bathroom Toilet: Standard Home Equipment: Environmental consultant - 2 wheels, Cane - single point, Bedside commode  Functional History: Prior Function Level of Independence: Independent Comments: pt is on disability, former Doctor, hospital Status:  Mobility: Bed Mobility Overal bed mobility: Needs Assistance Bed Mobility: Supine to Sit Supine to sit: Min assist, HOB elevated (use of rail) Sit to supine: Mod assist, +2 for physical assistance General bed mobility comments: pt much more aggressive with his assist today. Transfers Overall transfer level: Needs assistance Equipment used: (chairback for standing assist) Transfers: Sit to/from Stand, [employer] Transfers Sit to Stand: Mod assist, +2 physical assistance Squat pivot transfers: Mod  assist General transfer comment: assist to come forward and initial support at R knee      ADL: ADL Overall ADL's : Needs assistance/impaired Eating/Feeding: Set up, Bed level Grooming: Brushing hair, Oral care, Sitting, Supervision/safety Upper Body Bathing: Sitting, Moderate assistance Lower Body Bathing: Total assistance, +2 for physical assistance, Bed level Upper Body Dressing : Sitting, Moderate assistance Upper Body Dressing Details (indicate cue type and reason): instructed to dress L UE first Lower Body Dressing: +2 for physical assistance, Total assistance, Bed level  Cognition: Cognition Overall Cognitive Status: Within Functional Limits for tasks assessed Orientation Level: Oriented X4 Cognition Arousal/Alertness: Awake/alert Behavior During Therapy: WFL for tasks assessed/performed Overall Cognitive Status: Within Functional Limits for tasks assessed  Blood pressure 163/73, pulse 96, temperature 99.5 F (37.5 C), temperature source Oral, resp. rate 18, height  (1.905 m), weight 103.42 kg (228 lb), SpO2 97 %. Physical Exam  Vitals reviewed. Constitutional: He is oriented to person, place, and time. He appears well-developed and well-nourished.  HENT:  Head: Normocephalic and atraumatic.  Eyes: Conjunctivae and EOM are normal.  Neck: Normal range of motion. Neck supple. No thyromegaly present.  Cardiovascular: Normal rate and regular rhythm.  Respiratory: Effort normal and breath sounds normal. No respiratory distress.  GI: Soft. Bowel sounds are normal. He exhibits no distension.  Genitourinary:  +  Foley  Musculoskeletal: He exhibits edema (LUE).  RUE 5/5 LUE 3+/5 grossly LLE 5/5  RLE 2/5 hip flexion, 3/5 ankle dorsi/plantar  flexion  Neurological: He is alert and oriented to person, place, and time.  Skin: Skin is warm and dry.  Right foot dressing in placed was some dried blood on the dressing.  RLE with VAC and drain in place Left upper extremity  wrist dressing in place appropriately tender     Lab Results Last 24 Hours    Results for orders placed or performed during the hospital encounter of 04/06/15 (from the past 24 hour(s))  CBC with Differential/Platelet Status: Abnormal   Collection Time: 04/29/15 6:42 AM  Result Value Ref Range   WBC 9.5 4.0 - 10.5 K/uL   RBC 2.93 (L) 4.22 - 5.81 MIL/uL   Hemoglobin 8.1 (L) 13.0 - 17.0 g/dL   HCT 16.1 (L) 09.6 - 04.5 %   MCV 87.7 78.0 - 100.0 fL   MCH 27.6 26.0 - 34.0 pg   MCHC 31.5 30.0 - 36.0 g/dL   RDW 40.9 (H) 81.1 - 91.4 %   Platelets 382 150 - 400 K/uL   Neutrophils Relative % 66 %   Neutro Abs 6.3 1.7 - 7.7 K/uL   Lymphocytes Relative 17 %   Lymphs Abs 1.6 0.7 - 4.0 K/uL   Monocytes Relative 14 %   Monocytes Absolute 1.4 (H) 0.1 - 1.0 K/uL   Eosinophils Relative 3 %   Eosinophils Absolute 0.3 0.0 - 0.7 K/uL   Basophils Relative 0 %   Basophils Absolute 0.0 0.0 - 0.1 K/uL  Protime-INR Status: Abnormal   Collection Time: 04/29/15 6:42 AM  Result Value Ref Range   Prothrombin Time 22.4 (H) 11.6 - 15.2 seconds   INR 1.98 (H) 0.00 - 1.49  Basic metabolic panel Status: Abnormal   Collection Time: 04/29/15 6:42 AM  Result Value Ref Range   Sodium 136 135 - 145 mmol/L   Potassium 3.6 3.5 - 5.1 mmol/L   Chloride 99 (L) 101 - 111 mmol/L   CO2 30 22 - 32 mmol/L   Glucose, Bld 99 65 - 99 mg/dL   BUN <5 (L) 6 - 20 mg/dL   Creatinine, Ser 7.82 0.61 - 1.24 mg/dL   Calcium 7.9 (L) 8.9 - 10.3 mg/dL   GFR calc non Af Amer >60 >60 mL/min   GFR calc Af Amer >60 >60 mL/min   Anion gap 7 5 - 15      Imaging Results (Last 48 hours)    No results found.    Assessment/Plan: Diagnosis: Sepsis 1. Does the need for close, 24 hr/day medical supervision in concert with the patient's rehab needs make it unreasonable for this  patient to be served in a less intensive setting? Yes 2. Co-Morbidities requiring supervision/potential complications: SVT, chronic pain, VTE, muscle flap 3. Due to bladder management, safety, skin/wound care, disease management, medication administration, pain management and patient education, does the patient require 24 hr/day rehab nursing? Yes 4. Does the patient require coordinated care of a physician, rehab nurse, PT (1-2 hrs/day, 5 days/week) and OT (1-2 hrs/day, 5 days/week) to address physical and functional deficits in the context of the above medical diagnosis(es)? Yes Addressing deficits in the following areas: balance, endurance, locomotion, strength, transferring, bowel/bladder control, bathing, dressing, grooming, toileting and psychosocial support 5. Can the patient actively participate in an intensive therapy program of at least 3 hrs of therapy per day at least 5 days per week? Potentially  6. The potential for patient to make measurable gains while on inpatient rehab is good 7. Anticipated functional outcomes upon discharge from inpatient rehab are min assist with PT, supervision and min assist with OT, n/a with SLP. 8. Estimated rehab length of stay to reach the above functional goals is: 13-17 days 9. Does the patient have adequate social supports and living environment to accommodate these discharge functional goals? Potentially 10. Anticipated D/C setting: Home 11. Anticipated post D/C treatments: HH therapy and Home excercise program 12. Overall Rehab/Functional Prognosis: good  RECOMMENDATIONS: This patient's condition is appropriate for continued rehabilitative care in the following setting: CIR Patient has agreed to participate in recommended program. Yes Note that insurance prior authorization may be required for reimbursement for recommended care.  Comment: Rehab Admissions Coordinator to follow up, with focus on stabilization of HR, improvement in pain control,  evidence of ability to tolerate 3 hours therapy/day, and confirmation of social support with physical assistance.    04/29/2015       Revision History     Date/Time User Provider Type Action   04/29/2015 11:54 AM [name], MD Physician Sign   04/29/2015 7:32 AM Charlton Amor, PA-C Physician Assistant Pend   View Details Report       Routing History     Date/Time From To Method   04/29/2015 11:54 AM [name], MD [name], MD In [facility]   04/29/2015 11:54 AM [name], MD [name], MD Fax

## 2015-04-30 NOTE — Progress Notes (Signed)
Craig Mage, RN Rehab Admission Coordinator Signed Physical Medicine and Rehabilitation PMR Pre-admission 04/30/2015 1:49 PM  Related encounter: ED to Hosp-Admission (Current) from 04/06/2015 in [facility] SURGICAL    Expand All Collapse All   PMR Admission Coordinator Pre-Admission Assessment  Patient: [name] is an 59 y.o., male MRN: [id number] DOB: March 31, 1956 Height: 6\' 3"  (190.5 cm) Weight: 103.42 kg (228 lb)  Insurance Information HMO: No PPO: PCP: IPA: 80/20: OTHER:  PRIMARY: Medicaid [place] access Policy#: [id number] Subscriber: Elam Dutch CM Name: Phone#: Fax#:  Pre-Cert#: Employer: Disabled Benefits: Phone #: [phone] Name: Automated Eff. Date: Eligible 04/29/15 Deduct: Out of Pocket Max: Life Max:  CIR: SNF:  Outpatient: Co-Pay:  Home Health: Co-Pay:  DME: Co-Pay:  Providers: Note: Patient says he has applied for disability 02/23/13 but court case is pending. He does have medicaid.   Medicaid Application Date: Case Manager:  Disability Application Date: Case Worker:   Emergency Contact Information Contact Information    Name Relation Home Work Mobile   [place],[name] Spouse [phone]       Current Medical History  Patient Admitting Diagnosis: Debility, sepsis   History of Present Illness:A 59 y.o. right handed male history of hypertension, chronic knee pain, peptic ulcer disease, MRSA abscesses, peripheral neuropathy. Independent with occasional cane and walker prior to admission living with wife, in a home with facilities on the first floor and 4 steps to enter. Presented 04/07/2015 with recent fall 04/03/2015 with increasing left hand and  wrist pain. In the ED noted to be hypotensive tachycardia and white blood cell count of 31,300. Placed on broad-spectrum anti-biotic's. MRI of left hand show dorsal distal radius transverse fracture, diffuse edema of the hand likely reactive to distal radius fracture, tear of the dorsal band of the scaphounate ligament. Multiple small deep fluid collections in the hands compatible with hematoma question superimposed infection. CT of right femur due to pain showed a 7.4 cm process involving or adjacent to the proximal right SFA question aneurysm, pseudoaneurysm or hematoma. 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 Imogene Burn. Follow-up infectious disease blood cultures positive bacillus/MSSA and remains on anti-bowel therapy is advised. Patient returned to the OR 04/13/2015 for a repeat incision and drainage of thigh abscess negative pressure dressing placement as well as washout of right thigh abscess cavity 04/15/2015 and 04/17/2015 with rectus flap procedure 04/19/2015 per plastic surgery Dr Leta Baptist and wound VAC change 04/23/2015. . TTE did not show valvular insufficiency or abscess. Hospital course tachycardia/SVT (requiring adenosine) with cardiology services consulted Dr. Jacinto Halim. Echocardiogram with ejection fraction of 60% grade 1 diastolic dysfunction. In regards to abscess of left dorsal hand underwent incision and drainage 04/23/2015 per [name]. Vascular study left upper extremity shows no DVT. Patient developed gangrenous thromboembolic injury to the right foot with consult [name]. No plan for invasive procedures at this time with wound care nurse follow-up for skin care. Patient placed on Coumadin for thromboembolism as per [name]. Physical and occupational therapy evaluations completed 04/24/2015 with recommendations of physical medicine rehabilitation consult.  Past Medical  History  Past Medical History  Diagnosis Date  . Hypertension   . Chronic knee pain   . Hyperlipemia   . PUD (peptic ulcer disease)     Family History  family history includes Heart attack (age of onset: 35) in his paternal grandmother; Heart disease (age of onset: 70) in his  mother; Stroke in his maternal grandfather and paternal grandfather.  Prior Rehab/Hospitalizations: Had outpatient therapy after L TKR revision in 05/16  Has the patient had major surgery during 100 days prior to admission? Yes. Had L TKR revision in 05/16  Current Medications   Current facility-administered medications:  . 0.9 % sodium chloride infusion, , Intravenous, Continuous, Lonia Blood, MD, Last Rate: 10 mL/hr at 04/26/15 1846 . acetaminophen (TYLENOL) tablet 650 mg, 650 mg, Oral, Q6H PRN, 650 mg at 04/19/15 2149 **OR** acetaminophen (TYLENOL) suppository 650 mg, 650 mg, Rectal, Q6H PRN, Drema Dallas, MD . antiseptic oral rinse (BIOTENE) solution 15 mL, 15 mL, Mouth Rinse, PRN, Clydia Llano, MD . ceFAZolin (ANCEF) IVPB 2 g/50 mL premix, 2 g, Intravenous, 3 times per day, Ginnie Smart, MD, 2 g at 04/30/15 0854 . clonazePAM (KLONOPIN) tablet 0.5 mg, 0.5 mg, Oral, QID PRN, Lonia Blood, MD, 0.5 mg at 04/30/15 0502 . docusate sodium (COLACE) capsule 100 mg, 100 mg, Oral, BID, Stephani Police, PA-C, 100 mg at 04/30/15 1042 . famciclovir [facility]) tablet 500 mg, 500 mg, Oral, BID PRN, Tora [facility] York, PA-C . feeding supplement (ENSURE ENLIVE) (ENSURE ENLIVE) liquid 237 mL, 237 mL, Oral, TID BM, Idell Pickles, RD, 237 mL at 04/30/15 1324 . levalbuterol (XOPENEX) nebulizer solution 0.63 mg, 0.63 mg, Nebulization, Q3H PRN, Lonia Blood, MD . metoprolol (LOPRESSOR) injection 5 mg, 5 mg, Intravenous, Q6H PRN, Clydia Llano, MD, 5 mg at 04/29/15 2122 . metoprolol tartrate (LOPRESSOR) tablet 75 mg, 75 mg, Oral, BID, Clydia Llano, MD, 75 mg at 04/30/15 1041 .  morphine (MS CONTIN) 12 hr tablet 90 mg, 90 mg, Oral, 2 times per day, Rodolph Bong, MD, 90 mg at 04/30/15 0501 . morphine 4 MG/ML injection 4 mg, 4 mg, Intravenous, Q4H PRN, Clydia Llano, MD, 4 mg at 04/30/15 1055 . naloxegol oxalate (MOVANTIK) tablet 25 mg, 25 mg, Oral, Daily, Marianne L York, PA-C, 25 mg at 04/30/15 1041 . oxyCODONE (Oxy IR/ROXICODONE) immediate release tablet 30 mg, 30 mg, Oral, Q6H PRN, Clydia Llano, MD, 30 mg at 04/30/15 0854 . sodium chloride 0.9 % injection 10-40 mL, 10-40 mL, Intracatheter, PRN, Lonia Blood, MD, 10 mL at 04/29/15 2211 . sodium chloride 0.9 % injection 3 mL, 3 mL, Intravenous, Q12H, Marianne L York, PA-C, 3 mL at 04/29/15 2124 . trifluridine (VIROPTIC) 1 % ophthalmic solution 1 drop, 1 drop, Both Eyes, BID PRN, Lonia Blood, MD . venlafaxine XR [facility]) 24 hr capsule 75 mg, 75 mg, Oral, Q breakfast, Drema Dallas, MD, 75 mg at 04/30/15 0502 . warfarin (COUMADIN) tablet 5 mg, 5 mg, Oral, ONCE-1800, Para March [place], RPH . Warfarin - Pharmacist Dosing Inpatient, , Does not apply, q1800, Marquita Palms, [facility]  Patients Current Diet: Diet regular Room service appropriate?: Yes; Fluid consistency:: Thin Diet - low sodium heart healthy  Precautions / Restrictions Precautions Precautions: Fall Precaution Comments: no specific weight bearing order but avoided weight bearing through L wrist Restrictions Weight Bearing Restrictions: No   Has the patient had 2 or more falls or a fall with injury in the past year?Yes. Has had 2 falls. The last fall resulted in injury to left wrist, elbow and shoulder.  Prior Activity Level Household: Housebound most recently only going out about once every 2 weeks. Stopped driving about 6 months ago. 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  Home Assistive Devices / Equipment Home Assistive Devices/Equipment: Environmental consultant (  specify type) (front wheel) Home Equipment: Walker  - 2 wheels, Cane - single point, Bedside commode  Prior Device Use: Indicate devices/aids used by the patient prior to current illness, exacerbation or injury? Walker and cane. Uses a RW most of the time.  Prior Functional Level Prior Function Level of Independence: Independent Comments: pt is on disability, former Chiropractor  Self Care: Did the patient need help bathing, dressing, using the toilet or eating? Independent  Indoor Mobility: Did the patient need assistance with walking from room to room (with or without device)? Independent  Stairs: Did the patient need assistance with internal or external stairs (with or without device)? Independent  Functional Cognition: Did the patient need help planning regular tasks such as shopping or remembering to take medications? Independent  Current Functional Level Cognition  Overall Cognitive Status: Within Functional Limits for tasks assessed Orientation Level: Oriented X4   Extremity Assessment (includes Sensation/Coordination)  Upper Extremity Assessment: RUE deficits/detail, LUE deficits/detail RUE Deficits / Details: WFL LUE Deficits / Details: 3-/5 shoulder, 3+/5 elbow, 3/5 grip, pitting edema throughout LUE Coordination: decreased fine motor, decreased gross motor  Lower Extremity Assessment: Defer to PT evaluation RLE Deficits / Details: Extremely edematous with pitting, Knee AAROM -20*ext to 80* flexion Gross extention 3-/5 RLE Sensation: history of peripheral neuropathy RLE Coordination: decreased fine motor, decreased gross motor LLE Deficits / Details: Generally functional but stiff with -10* terminal ext of the knee, but grossly >3+/5 strength LLE Coordination: decreased fine motor    ADLs  Overall ADL's : Needs assistance/impaired Eating/Feeding: Set up, Bed level Grooming: Brushing hair, Oral care, Sitting, Supervision/safety Upper Body Bathing: Sitting, Moderate assistance Lower Body  Bathing: Total assistance, +2 for physical assistance, Bed level Upper Body Dressing : Sitting, Moderate assistance Upper Body Dressing Details (indicate cue type and reason): instructed to dress L UE first Lower Body Dressing: +2 for physical assistance, Total assistance, Bed level General ADL Comments: Performed retrograde massage to L UE and elevated on 3 pillows. Instructed pt to remind staff to elevate L UE when positioning pt in bed or chair.    Mobility  Overal bed mobility: Needs Assistance, +2 for physical assistance, + 2 for safety/equipment Bed Mobility: Supine to Sit Supine to sit: Min assist, +2 for physical assistance, +2 for safety/equipment, Mod assist, HOB elevated Sit to supine: Mod assist, +2 for physical assistance General bed mobility comments: pt much more aggressive with his assist today.    Transfers  Overall transfer level: Needs assistance Equipment used: 2 person hand held assist Transfers: Sit to/from Stand, Stand Pivot Transfers Sit to Stand: Mod assist, +2 physical assistance, +2 safety/equipment, From elevated surface (blocked knees) Stand pivot transfers: Mod assist, +2 physical assistance, +2 safety/equipment, From elevated surface Squat pivot transfers: Mod assist General transfer comment: assisted to shift forward and to block knees    Ambulation / Gait / Stairs / Wheelchair Mobility       Posture / Balance Dynamic Sitting Balance Sitting balance - Comments: controls with tendency to use L arm but asked him to refrain Balance Overall balance assessment: Needs assistance Sitting-balance support: Feet supported, Single extremity supported Sitting balance-Leahy Scale: Fair Sitting balance - Comments: controls with tendency to use L arm but asked him to refrain Standing balance support: Bilateral upper extremity supported Standing balance-Leahy Scale: Poor Standing balance comment: pain in R foot is a hindrance    Special needs/care  consideration BiPAP/CPAP No CPM No Continuous Drip IV No Dialysis No  Life Vest  No Oxygen No Special Bed No Trach Size No Wound Vac (area) Was removed 04/29/15  Skin Right groin and abdominal wounds, drain in right thigh, dressing left hand.  Bowel mgmt: Says he had BM 04/29/15 but no documentation of BM Bladder mgmt: Urinary catheter in place Diabetic mgmt No    Previous Home Environment Living Arrangements: Spouse/significant other, Children (6 y.o. grandson) Available Help at Discharge: Family, Available 24 hours/day Type of Home: House Home Layout: Two level, 1/2 bath on main level Home Access: Stairs to enter Entrance Stairs-Rails: Right, Left Entrance Stairs-Number of Steps: 4 Bathroom Shower/Tub: Psychologist, counselling (upstairs) Firefighter: Standard Home Care Services: No  Discharge Living Setting Plans for Discharge Living Setting: Patient's home, House, Lives with (comment) (Lives with wife.) Type of Home at Discharge: House Discharge Home Layout: Two level, 1/2 bath on main level, Bed/bath upstairs (Plans to use dining room as a bedroom.) Alternate Level Stairs-Number of Steps: Flight Discharge Home Access: Stairs to enter [employer] of Steps: 3 at the front and 5 at the garage entry. Does the patient have any problems obtaining your medications?: No  Social/Family/Support Systems Patient Roles: Spouse Contact Information: Cathan Gearin - wife Anticipated Caregiver: wife Anticipated Caregiver's Contact Information: Valery Chance - wife [phone] Ability/Limitations of Caregiver: Wife can assist Caregiver Availability: 24/7 Discharge Plan Discussed with Primary Caregiver: Yes Is Caregiver In Agreement with Plan?: Yes Does Caregiver/Family have Issues with Lodging/Transportation while Pt is in Rehab?: No  Goals/Additional Needs Patient/Family Goal for Rehab: PT min assist, OT supervision to min assist goals Expected length  of stay: 13-17 days Cultural Considerations: None Dietary Needs: Regular diet, thin liquids Equipment Needs: TBD Pt/Family Agrees to Admission and willing to participate: Yes Program Orientation Provided & Reviewed with Pt/Caregiver Including Roles & Responsibilities: Yes  Decrease burden of Care through IP rehab admission: N/A  Possible need for SNF placement upon discharge: Not planned  Patient Condition: This patient's condition remains as documented in the consult dated 04/29/15, in which the Rehabilitation Physician determined and documented that the patient's condition is appropriate for intensive rehabilitative care in an inpatient rehabilitation facility pending stable heart rate, pain control, tolerating therapies, and support at home. These areas have been addressed. Wife will provide supervision and assist at home. All other areas have been addressed by attending MD and cardiology. Will admit to inpatient rehab today.  Preadmission Screen Completed By: [name], 04/30/2015 2:11 PM ______________________________________________________________________  Discussed status with Dr. Allena Katz on 04/30/15 at 1410 and received telephone approval for admission today.  Admission Coordinator: [name], time1410/Date10/04/16          Cosigned by: [name], MD at 04/30/2015 2:16 PM  Revision History     Date/Time User Provider Type Action   04/30/2015 2:16 PM [name], MD Physician Cosign   04/30/2015 2:13 PM Craig Mage, RN Rehab Admission Coordinator Sign

## 2015-04-30 NOTE — Progress Notes (Addendum)
Rehab admissions - I met with patient this am and I gave him rehab brochures.  He does want to admit to inpatient rehab.  I do have a rehab bed available today.  Will check with attending MD for medical readiness.  Call me for questions.  #[phone]  I spoke with attending MD.  Will admit to acute inpatient rehab today.  #[phone]

## 2015-04-30 NOTE — PMR Pre-admission (Signed)
PMR Admission Coordinator Pre-Admission Assessment  Patient: [name] is an 59 y.o., male MRN: [id number] DOB: 1955-12-28 Height:  (190.5 cm) Weight: 103.42 kg (228 lb)              Insurance Information HMO: No    PPO:       PCP:       IPA:       80/20:       OTHER:   PRIMARY: Medicaid [place] access      Policy#: [id number]      Subscriber: Elam Dutch CM Name:        Phone#:       Fax#:   Pre-Cert#:        Employer: Disabled Benefits:  Phone #: [phone]     Name: Automated Eff. Date: Eligible 04/29/15     Deduct:        Out of Pocket Max:        Life Max:   CIR:        SNF:   Outpatient:       Co-Pay:   Home Health:        Co-Pay:   DME:       Co-Pay:   Providers: Note:  Patient says he has applied for disability 02/23/13 but court case is pending.  He does have medicaid.   Medicaid Application Date:        Case Manager:   Disability Application Date:        Case Worker:    Emergency Contact Information Contact Information    Name Relation Home Work Mobile   Isabell,[name] Spouse [phone]       Current Medical History  Patient Admitting Diagnosis: Debility, sepsis    History of Present Illness:A 59 y.o. right handed male history of hypertension, chronic knee pain, peptic ulcer disease, MRSA abscesses, peripheral neuropathy. Independent with occasional cane and walker prior to admission living with wife, in a home with facilities on the first floor and 4 steps to enter. Presented 04/07/2015 with recent fall 04/03/2015 with increasing left hand and wrist pain. In the ED noted to be hypotensive tachycardia and white blood cell count of 31,300. Placed on broad-spectrum anti-biotic's. MRI of left hand show dorsal distal radius transverse fracture, diffuse edema of the hand likely reactive to distal radius fracture, tear of the dorsal band of the scaphounate ligament. Multiple small deep fluid collections in the hands compatible with hematoma question superimposed infection. CT  of right femur due to pain showed a 7.4 cm process involving or adjacent to the proximal right SFA question aneurysm, pseudoaneurysm or hematoma. 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 Imogene Burn. Follow-up infectious disease blood cultures positive bacillus/MSSA and remains on anti-bowel therapy is advised. Patient returned to the OR 04/13/2015 for a repeat incision and drainage of thigh abscess negative pressure dressing placement as well as washout of right thigh abscess cavity 04/15/2015 and 04/17/2015 with rectus flap procedure 04/19/2015 per plastic surgery Dr Leta Baptist and wound VAC change 04/23/2015. . TTE did not show valvular insufficiency or abscess. Hospital course tachycardia/SVT (requiring adenosine) with cardiology services consulted Dr. Jacinto Halim. Echocardiogram with ejection fraction of 60% grade 1 diastolic dysfunction. In regards to abscess of left dorsal hand underwent incision and drainage 04/23/2015 per [name]. Vascular study left upper extremity shows no DVT. Patient developed gangrenous thromboembolic injury to the right foot with consult Dr.  Duda. No plan for invasive procedures at this time with wound care nurse follow-up for skin care. Patient placed on Coumadin for thromboembolism as per [name]. Physical and occupational therapy evaluations completed 04/24/2015 with recommendations of physical medicine rehabilitation consult.  Past Medical History  Past Medical History  Diagnosis Date  . Hypertension   . Chronic knee pain   . Hyperlipemia   . PUD (peptic ulcer disease)     Family History  family history includes Heart attack (age of onset: 89) in his paternal grandmother; Heart disease (age of onset: 81) in his mother; Stroke in his maternal grandfather and paternal grandfather.  Prior Rehab/Hospitalizations: Had outpatient therapy after L TKR revision in  05/16  Has the patient had major surgery during 100 days prior to admission? Yes.  Had L TKR revision in 05/16  Current Medications   Current facility-administered medications:  .  0.9 %  sodium chloride infusion, , Intravenous, Continuous, Lonia Blood, MD, Last Rate: 10 mL/hr at 04/26/15 1846 .  acetaminophen (TYLENOL) tablet 650 mg, 650 mg, Oral, Q6H PRN, 650 mg at 04/19/15 2149 **OR** acetaminophen (TYLENOL) suppository 650 mg, 650 mg, Rectal, Q6H PRN, Drema Dallas, MD .  antiseptic oral rinse (BIOTENE) solution 15 mL, 15 mL, Mouth Rinse, PRN, Clydia Llano, MD .  ceFAZolin (ANCEF) IVPB 2 g/50 mL premix, 2 g, Intravenous, 3 times per day, Ginnie Smart, MD, 2 g at 04/30/15 0854 .  clonazePAM (KLONOPIN) tablet 0.5 mg, 0.5 mg, Oral, QID PRN, Lonia Blood, MD, 0.5 mg at 04/30/15 0502 .  docusate sodium (COLACE) capsule 100 mg, 100 mg, Oral, BID, Stephani Police, PA-C, 100 mg at 04/30/15 1042 .  famciclovir [facility]) tablet 500 mg, 500 mg, Oral, BID PRN, Tora Kindred York, PA-C .  feeding supplement (ENSURE ENLIVE) (ENSURE ENLIVE) liquid 237 mL, 237 mL, Oral, TID BM, Idell Pickles, RD, 237 mL at 04/30/15 1324 .  levalbuterol (XOPENEX) nebulizer solution 0.63 mg, 0.63 mg, Nebulization, Q3H PRN, Lonia Blood, MD .  metoprolol (LOPRESSOR) injection 5 mg, 5 mg, Intravenous, Q6H PRN, Clydia Llano, MD, 5 mg at 04/29/15 2122 .  metoprolol tartrate (LOPRESSOR) tablet 75 mg, 75 mg, Oral, BID, Clydia Llano, MD, 75 mg at 04/30/15 1041 .  morphine (MS CONTIN) 12 hr tablet 90 mg, 90 mg, Oral, 2 times per day, Rodolph Bong, MD, 90 mg at 04/30/15 0501 .  morphine 4 MG/ML injection 4 mg, 4 mg, Intravenous, Q4H PRN, Clydia Llano, MD, 4 mg at 04/30/15 1055 .  naloxegol oxalate (MOVANTIK) tablet 25 mg, 25 mg, Oral, Daily, Marianne L York, PA-C, 25 mg at 04/30/15 1041 .  oxyCODONE (Oxy IR/ROXICODONE) immediate release tablet 30 mg, 30 mg, Oral, Q6H PRN, Clydia Llano, MD, 30 mg at 04/30/15  0854 .  sodium chloride 0.9 % injection 10-40 mL, 10-40 mL, Intracatheter, PRN, Lonia Blood, MD, 10 mL at 04/29/15 2211 .  sodium chloride 0.9 % injection 3 mL, 3 mL, Intravenous, Q12H, Marianne L York, PA-C, 3 mL at 04/29/15 2124 .  trifluridine (VIROPTIC) 1 % ophthalmic solution 1 drop, 1 drop, Both Eyes, BID PRN, Lonia Blood, MD .  venlafaxine XR [facility]) 24 hr capsule 75 mg, 75 mg, Oral, Q breakfast, Drema Dallas, MD, 75 mg at 04/30/15 0502 .  warfarin (COUMADIN) tablet 5 mg, 5 mg, Oral, ONCE-1800, Para March [place], RPH .  Warfarin - Pharmacist Dosing Inpatient, , Does not apply, q1800, Marquita Palms, [facility]  Patients Current Diet: Diet regular Room service appropriate?: Yes; Fluid consistency:: Thin Diet - low sodium heart healthy  Precautions / Restrictions Precautions Precautions: Fall Precaution Comments: no specific weight bearing order but avoided weight bearing through L wrist Restrictions Weight Bearing Restrictions: No   Has the patient had 2 or more falls or a fall with injury in the past year?Yes.  Has had 2 falls.  The last fall resulted in injury to left wrist, elbow and shoulder.  Prior Activity Level Household: Housebound most recently only going out about once every 2 weeks.  Stopped driving about 6 months ago.  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  Home Assistive Devices / Equipment Home Assistive Devices/Equipment: Environmental consultant (specify type) (front wheel) Home Equipment: Walker - 2 wheels, Cane - single point, Bedside commode  Prior Device Use: Indicate devices/aids used by the patient prior to current illness, exacerbation or injury? Walker and cane.  Uses a RW most of the time.  Prior Functional Level Prior Function Level of Independence: Independent Comments: pt is on disability, former Chiropractor  Self Care: Did the patient need help bathing, dressing, using the toilet or eating?   Independent  Indoor Mobility: Did the patient need assistance with walking from room to room (with or without device)? Independent  Stairs: Did the patient need assistance with internal or external stairs (with or without device)? Independent  Functional Cognition: Did the patient need help planning regular tasks such as shopping or remembering to take medications? Independent  Current Functional Level Cognition  Overall Cognitive Status: Within Functional Limits for tasks assessed Orientation Level: Oriented X4    Extremity Assessment (includes Sensation/Coordination)  Upper Extremity Assessment: RUE deficits/detail, LUE deficits/detail RUE Deficits / Details: WFL LUE Deficits / Details: 3-/5 shoulder, 3+/5 elbow, 3/5 grip, pitting edema throughout LUE Coordination: decreased fine motor, decreased gross motor  Lower Extremity Assessment: Defer to PT evaluation RLE Deficits / Details: Extremely edematous with pitting, Knee AAROM  -20*ext to 80* flexion  Gross extention 3-/5 RLE Sensation: history of peripheral neuropathy RLE Coordination: decreased fine motor, decreased gross motor LLE Deficits / Details: Generally functional but stiff with -10*  terminal ext of the knee, but grossly >3+/5 strength LLE Coordination: decreased fine motor    ADLs  Overall ADL's : Needs assistance/impaired Eating/Feeding: Set up, Bed level Grooming: Brushing hair, Oral care, Sitting, Supervision/safety Upper Body Bathing: Sitting, Moderate assistance Lower Body Bathing: Total assistance, +2 for physical assistance, Bed level Upper Body Dressing : Sitting, Moderate assistance Upper Body Dressing Details (indicate cue type and reason): instructed to dress L UE first Lower Body Dressing: +2 for physical assistance, Total assistance, Bed level General ADL Comments: Performed retrograde massage to L UE and elevated on 3 pillows.  Instructed pt to remind staff to elevate L UE when positioning pt in bed or  chair.    Mobility  Overal bed mobility: Needs Assistance, +2 for physical assistance, + 2 for safety/equipment Bed Mobility: Supine to Sit Supine to sit: Min assist, +2 for physical assistance, +2 for safety/equipment, Mod assist, HOB elevated Sit to supine: Mod assist, +2 for physical assistance General bed mobility comments: pt much more aggressive with his assist today.    Transfers  Overall transfer level: Needs assistance Equipment used: 2 person hand held assist Transfers: Sit to/from Stand, Stand Pivot Transfers Sit to Stand: Mod assist, +2 physical assistance, +2 safety/equipment, From elevated surface (blocked knees) Stand pivot transfers: Mod assist, +2 physical  assistance, +2 safety/equipment, From elevated surface Squat pivot transfers: Mod assist General transfer comment: assisted to shift forward and to block knees    Ambulation / Gait / Stairs / Wheelchair Mobility       Posture / Balance Dynamic Sitting Balance Sitting balance - Comments: controls with tendency to use L arm but asked him to refrain Balance Overall balance assessment: Needs assistance Sitting-balance support: Feet supported, Single extremity supported Sitting balance-Leahy Scale: Fair Sitting balance - Comments: controls with tendency to use L arm but asked him to refrain Standing balance support: Bilateral upper extremity supported Standing balance-Leahy Scale: Poor Standing balance comment: pain in R foot is a hindrance    Special needs/care consideration BiPAP/CPAP No CPM No Continuous Drip IV No Dialysis No       Life Vest No Oxygen No Special Bed No Trach Size No Wound Vac (area) Was removed 04/29/15     Skin Right groin and abdominal wounds, drain in right thigh, dressing left hand.                        Bowel mgmt: Says he had BM 04/29/15 but no documentation of BM Bladder mgmt: Urinary catheter in place Diabetic mgmt No    Previous Home Environment Living Arrangements:  Spouse/significant other, Children (6 y.o. grandson) Available Help at Discharge: Family, Available 24 hours/day Type of Home: House Home Layout: Two level, 1/2 bath on main level Home Access: Stairs to enter Entrance Stairs-Rails: Right, Left Entrance Stairs-Number of Steps: 4 Bathroom Shower/Tub: Psychologist, counselling (upstairs) Firefighter: Standard Home Care Services: No  Discharge Living Setting Plans for Discharge Living Setting: Patient's home, House, Lives with (comment) (Lives with wife.) Type of Home at Discharge: House Discharge Home Layout: Two level, 1/2 bath on main level, Bed/bath upstairs (Plans to use dining room as a bedroom.) Alternate Level Stairs-Number of Steps: Flight Discharge Home Access: Stairs to enter [employer] of Steps: 3 at the front and 5 at the garage entry. Does the patient have any problems obtaining your medications?: No  Social/Family/Support Systems Patient Roles: Spouse Contact Information: Rei Medlen - wife Anticipated Caregiver: wife Anticipated Caregiver's Contact Information: Mabry Tift - wife [phone] Ability/Limitations of Caregiver: Wife can assist Caregiver Availability: 24/7 Discharge Plan Discussed with Primary Caregiver: Yes Is Caregiver In Agreement with Plan?: Yes Does Caregiver/Family have Issues with Lodging/Transportation while Pt is in Rehab?: No  Goals/Additional Needs Patient/Family Goal for Rehab: PT min assist, OT supervision to min assist goals Expected length of stay: 13-17 days Cultural Considerations: None Dietary Needs: Regular diet, thin liquids Equipment Needs: TBD Pt/Family Agrees to Admission and willing to participate: Yes Program Orientation Provided & Reviewed with Pt/Caregiver Including Roles  & Responsibilities: Yes  Decrease burden of Care through IP rehab admission: N/A  Possible need for SNF placement upon discharge: Not planned  Patient Condition: This patient's condition remains as  documented in the consult dated 04/29/15, in which the Rehabilitation Physician determined and documented that the patient's condition is appropriate for intensive rehabilitative care in an inpatient rehabilitation facility pending stable heart rate, pain control, tolerating therapies, and support at home. These areas have been addressed. Wife will provide supervision and assist at home.  All other areas have been addressed by attending MD and cardiology.  Will admit to inpatient rehab today.  Preadmission Screen Completed By:  Trish Mage, 04/30/2015 2:11 PM ______________________________________________________________________   Discussed status with Dr. Allena Katz on 04/30/15 at 1410 and received  telephone approval for admission today.  Admission Coordinator:  Trish Mage, time1410/Date10/04/16

## 2015-04-30 NOTE — Discharge Summary (Signed)
Physician Discharge Summary  [name] ZOX:[id number] DOB: 01/11/1956 DOA: 04/06/2015  PCP: [name], MD  Admit date: 04/06/2015 Discharge date: 04/30/2015  Time spent: 40 minutes  Recommendations for Outpatient Follow-up:  1. Patient is getting discharged to [facility] inpatient rehabilitation. 2. Continue cefazolin for total of 6 weeks (for 3 more weeks). 3. Continue Coumadin for total of 3-6 month for right lower extremity DVT.  Discharge Diagnoses:  Principal Problem:   Severe sepsis (HCC) Active Problems:   Chronic pain syndrome   History of duodenal ulcer   ARF (acute renal failure) (HCC)   Cellulitis of left hand   Elevated INR   Foot swelling   Elevated bilirubin   Hypokalemia   Hypomagnesemia   Femoral artery aneurysm, right (HCC)   UTI (lower urinary tract infection)   Bacteremia   Bacteremia due to Staphylococcus aureus   Acute renal failure syndrome (HCC)   Fracture of left radius   Cellulitis   Arm pain, anterior   Radius distal fracture   Cellulitis of finger of left hand   Acute pyelonephritis   Aneurysm of right femoral artery (HCC)   DVT, lower extremity (HCC)   Abscess   Bacteremia due to Staphylococcus   Pulmonary hypertension (HCC)   Acute blood loss anemia   Radial fracture   Staphylococcus aureus bacteremia with sepsis (HCC)   Colles' fracture of left radius   Cellulitis of left upper extremity   Diastolic dysfunction   Abscess of left hand   Discharge Condition: Stable  Diet recommendation: Heart healthy  Filed Weights   04/06/15 2343  Weight: 103.42 kg (228 lb)    History of present illness:  59 y.o. Male with PUD, HLD, history of MRSA abscesses, peripheral neuropathy, and chronic pain who presented to [facility] with severe left hand pain. He was found to be septic with acute kidney injury and left hand cellulitis. After admission he was noted to have a dusky swollen right foot. The patient reported that approximately  2 weeks prior he started feeling poorly and eating less. 9/7 he could not feel his right foot and fell as a result. He landed on his left wrist, shoulder, and back side. Since then his left hand has been very painful and his wrist and arm had begun to swell. He has developed thick erythematous streaking on his left arm.    In the ER: Lactic acid was 4.16, INR 2.26 (not on anticoag), PTT 24.7, WBC 31.3, mag 1.3, potassium 2.7, procalcitonin 9.72. BP 80/68 He received 3L of IV bolus in the ER. 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 Reportedly the patient injected his groin with testosterone 3 weeks prior.   Hospital Course:   Severe sepsis - MSSA bacteremia  -Suspect his initial site was the R groin injection site  -Patient was hypotensive, tachycardic, with a white count of 31.3 on initial workup consistent with sepsis  -Continue antibiotics as per ID for total of 6 weeks -ID recommended TEE when able to, cardiology recommended to continue antibiotics without TEE. -TTE did not show valvular insufficiency or abscess, doubt TEE is going to show anything will needs intervention. -Patient already getting 6 weeks of antibiotics so even if vegetation seen, treatment is going to be the same, antibiotics for 6 weeks. -Per ID cefazolin for total of 6 weeks, and she already got 3 weeks of IV antibiotics.   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 has evaluated the foot and feels watchful waiting is appropriate and wishes to see the patient in follow-up in his office - ongoing wound care per Plastic  Surgery service   Right lower extremity DVT Started on anticoagulation, please see extensive DVT involving right lower extremity on Doppler ultrasound. Patient is on Coumadin, INR is 2.1 on discharge. Please note that patient has had elevated INR of 2.26 on admission without anticoagulation, this is likely secondary to sepsis. Targeted is at least 3 months of Coumadin anticoagulation.  R femoral vein disruption/DVT - severe R LE / groin edema  In OR 9/23 became clear femoral vein involved in infection, and fully thrombosed - Vasc Surgery oversewed the divided vessel at the time of surgery 9/23 - edema appears to be slowly improving  Acute blood loss anemia Due to infected vascular region s/p multiple surgeries + sepsis. Status post transfusion of total 7 units of packed RBCs  Sinus tachycardia > SVT > [facility]  Cardiology has completed an evaluation and recommends adenosine 6 mg should the patient suffer another episode of atrial tachycardia. SVT, received 6 mg of IV adenosine again on 10/2, converted to sinus tachycardia.  Patient was on atenolol at home, this is switched to metoprolol 75 mg twice a day.  Acute renal failure Creatinine is 0.9 at baseline - 4.4 on admission - has now normalized / recovered   L arm swelling - Left hand cellulitis with radius fracture - history of MRSA abscess Evaluated by [name] initially, w/ his plan being for the pt to follow-up in his office in approximately 2 weeks - swelling of arm noted 9/23 - venous duplex 9/24 w/o evidence of DVT - s/p Incision and Drainage of L Hand Abscess 9/27 - ongoing care per Hand Surgery. Try to decrease the anasarca, Lasix.   Possible pyelonephritis Urinalysis suggestive of infection - adequately covered with broad-spectrum empiric antibiotics - urine culture not helpful.  Hypokalemia and hypomagnesemia Recurrent - replace as needed   Chronic pain syndrome/chronic periods use has been on pain medications for 12  years - managed by Dr. [name] [facility]?)  Back on his home dose of MS Contin 90 mg twice a day and OxyIR 30 mg every 6 hours as needed.   Procedures:  None  Consultations:  Vascular surgery.  Cardiology, Dr. Jacinto Halim.  Plastic and reconstructive surgery Dr. Marzetta Board  Orthopedics foot and ankle surgery [name].  Orthopedics hand surgery [name].  Infectious disease  Discharge Exam: Filed Vitals:   04/30/15 0454  BP: 153/105  Pulse: 103  Temp: 99.1 F (37.3 C)  Resp: 19   General: Alert and awake, oriented x3, not in any acute distress. HEENT: anicteric sclera, pupils reactive to light and accommodation, EOMI CVS: S1-S2 clear, no murmur rubs or gallops Chest: clear to auscultation bilaterally, no wheezing, rales or rhonchi Abdomen: soft nontender, nondistended, normal bowel sounds, no organomegaly Extremities: no cyanosis, clubbing or edema noted bilaterally Neuro: Cranial nerves II-XII intact, no focal neurological deficits  Discharge Instructions   Discharge Instructions    Diet - low sodium heart healthy    Complete by:  As directed  Increase activity slowly    Complete by:  As directed           Current Discharge Medication List    START taking these medications   Details  ceFAZolin (ANCEF) 2-3 GM-% SOLR Inject 50 mLs (2 g total) into the vein every 8 (eight) hours.    Metoprolol Tartrate 75 MG TABS Take 75 mg by mouth 2 (two) times daily.    warfarin (COUMADIN) 5 MG tablet Take 1 tablet (5 mg total) by mouth daily at 6 PM.      CONTINUE these medications which have CHANGED   Details  clonazePAM (KLONOPIN) 0.5 MG tablet Take 4 tablets (2 mg total) by mouth 2 (two) times daily as needed for anxiety.      CONTINUE these medications which have NOT CHANGED   Details  albuterol (PROAIR HFA) 108 (90 BASE) MCG/ACT inhaler Inhale 1 puff into the lungs every 6 (six) hours as needed for wheezing or shortness of breath.     atorvastatin (LIPITOR) 80  MG tablet Take 80 mg by mouth daily.    famciclovir (FAMVIR) 500 MG tablet Take 500 mg by mouth 2 (two) times daily as needed (fever blisters).     !! morphine (MS CONTIN) 30 MG 12 hr tablet Take 30 mg by mouth every 12 (twelve) hours. Take with a 60 mg tablet for a 90 mg dose Refills: 0    !! morphine (MS CONTIN) 60 MG 12 hr tablet Take 60 mg by mouth every 12 (twelve) hours. Take with a 30 mg tablet for a 90 mg dose Refills: 0    naloxegol oxalate (MOVANTIK) 25 MG TABS tablet Take 25 mg by mouth 2 (two) times daily.    ondansetron (ZOFRAN) 8 MG tablet Take 8 mg by mouth daily as needed for nausea or vomiting.  Refills: 0    OVER THE COUNTER MEDICATION Place 1 drop into both eyes 2 (two) times daily as needed (dry eyes). OTC lubricant eye drops    oxycodone (ROXICODONE) 30 MG immediate release tablet Take 30 mg by mouth every 6 (six) hours. scheduled    SUMAtriptan (IMITREX) 50 MG tablet Take 50 mg by mouth daily as needed for migraine or headache. Maximum 2 doses in 2 days Refills: 0    trifluridine (VIROPTIC) 1 % ophthalmic solution Place 1 drop into both eyes 2 (two) times daily as needed (breakouts (blurry, matted eyelids)).    venlafaxine XR (EFFEXOR-XR) 37.5 MG 24 hr capsule Take 37.5 mg by mouth daily with breakfast.    colchicine 0.6 MG tablet Take 1 tablet (0.6 mg total) by mouth 2 (two) times daily. Qty: 14 tablet, Refills: 0     !! - Potential duplicate medications found. Please discuss with provider.    STOP taking these medications     atenolol (TENORMIN) 100 MG tablet      Diclofenac-Misoprostol 75-0.2 MG TBEC      ibuprofen (ADVIL,MOTRIN) 800 MG tablet      testosterone cypionate (DEPOTESTOSTERONE CYPIONATE) 200 MG/ML injection      oxyCODONE-acetaminophen (PERCOCET/ROXICET) 5-325 MG per tablet        Allergies  Allergen Reactions  . Imitrex [Sumatriptan] Other (See Comments)    Chest pain, 2004 (tolerates 50 mg prn)  . Verapamil Other (See Comments)     Causes pvc's   Follow-up Information    Follow up with DUDA,MARCUS V, MD In 1 week.   Specialty:  Orthopedic Surgery   Contact information:   [address] [place] [address] [phone]        The results of significant diagnostics from this hospitalization (including imaging, microbiology, ancillary and laboratory) are listed below for reference.    Significant Diagnostic Studies: Dg Chest 2 View  04/07/2015   CLINICAL DATA:  59 year old male with recent fall and shortness of breath.  EXAM: CHEST  2 VIEW  COMPARISON:  Radiograph dated 03/01/2015  FINDINGS: The heart size and mediastinal contours are within normal limits. Both lungs are clear. The visualized skeletal structures are unremarkable.  IMPRESSION: No active cardiopulmonary disease.   Electronically Signed   By: Elgie Collard M.D.   On: 04/07/2015 02:18   Dg Elbow Complete Left  04/07/2015   CLINICAL DATA:  59 year old male with trauma and pain in the left upper extremity.  EXAM: LEFT SHOULDER - 2+ VIEW; LEFT FOREARM - 2 VIEW; LEFT ELBOW - COMPLETE 3+ VIEW; LEFT HUMERUS - 2+ VIEW; LEFT WRIST - COMPLETE 3+ VIEW; LEFT HAND - COMPLETE 3+ VIEW  COMPARISON:  None.  FINDINGS: There is no evidence of fracture or dislocation. There is no evidence of arthropathy or other focal bone abnormality. Soft tissues are unremarkable.  There is diffuse soft tissue swelling of the distal forearm and hand. No radiopaque foreign object identified. Clinical correlation is recommended.  IMPRESSION: No acute fracture or dislocation.  Soft tissue swelling of the distal forearm and hand.   Electronically Signed   By: Elgie Collard M.D.   On: 04/07/2015 02:23   Dg Forearm Left  04/07/2015   CLINICAL DATA:  59 year old male with trauma and pain in the left upper extremity.  EXAM: LEFT SHOULDER - 2+ VIEW; LEFT FOREARM - 2 VIEW; LEFT ELBOW - COMPLETE 3+ VIEW; LEFT HUMERUS - 2+ VIEW; LEFT WRIST - COMPLETE 3+ VIEW; LEFT HAND - COMPLETE 3+ VIEW   COMPARISON:  None.  FINDINGS: There is no evidence of fracture or dislocation. There is no evidence of arthropathy or other focal bone abnormality. Soft tissues are unremarkable.  There is diffuse soft tissue swelling of the distal forearm and hand. No radiopaque foreign object identified. Clinical correlation is recommended.  IMPRESSION: No acute fracture or dislocation.  Soft tissue swelling of the distal forearm and hand.   Electronically Signed   By: Elgie Collard M.D.   On: 04/07/2015 02:23   Dg Wrist Complete Left  04/07/2015   CLINICAL DATA:  59 year old male with trauma and pain in the left upper extremity.  EXAM: LEFT SHOULDER - 2+ VIEW; LEFT FOREARM - 2 VIEW; LEFT ELBOW - COMPLETE 3+ VIEW; LEFT HUMERUS - 2+ VIEW; LEFT WRIST - COMPLETE 3+ VIEW; LEFT HAND - COMPLETE 3+ VIEW  COMPARISON:  None.  FINDINGS: There is no evidence of fracture or dislocation. There is no evidence of arthropathy or other focal bone abnormality. Soft tissues are unremarkable.  There is diffuse soft tissue swelling of the distal forearm and hand. No radiopaque foreign object identified. Clinical correlation is recommended.  IMPRESSION: No acute fracture or dislocation.  Soft tissue swelling of the distal forearm and hand.   Electronically Signed   By: Elgie Collard M.D.   On: 04/07/2015 02:23   Mr Hand Left Wo Contrast  04/07/2015   CLINICAL DATA:  Cellulitis of the LEFT hand. LEFT hand pain and swelling. Fall last Wednesday.  EXAM: MRI OF THE LEFT HAND WITHOUT CONTRAST  TECHNIQUE: Multiplanar, multisequence MR imaging was performed. No intravenous contrast was administered.  COMPARISON:  None.  FINDINGS: There is  diffuse edema in the LEFT hand. This is more prominent over the dorsal subcutaneous tissues of the hand however there are fluid collections in the deep spaces of the hand. These demonstrate heterogeneous T2 signal on the axial images. In the absence of contrast, these are difficult to further evaluate the the  heterogeneous material in the setting of recent trauma is most compatible with hematoma. These extend into the intrinsic muscles of the hand, particularly along the volar aspect of the third, fourth and fifth metacarpals.  There is a dorsal wrist fluid collection compatible with hematoma. The scaphoid bone appears intact. There is no osteomyelitis.  Although this study was not optimized for the wrist, there appears to be dorsal band tear of the scapholunate ligament (image 14 series 13). Lunotriquetral ligament grossly appears intact. Scapholunate ligament is poorly evaluated.  At the more proximal margin of the scan on the T1 weighted imaging, there appears to be a fracture of the distal radius (image 28 series 14). Scattered areas of bone marrow edema are present throughout the carpal bones compatible with contusions. Correlating with prior plain films, there appear to be a small avulsion fracture off the dorsal aspect of the carpal bones on the lateral projection.  IMPRESSION: 1. Dorsal distal radius transverse fracture, partially visible. 2. Diffuse edema of the hand, likely reactive to distal radius fracture. 3. Tear of the dorsal band of the scapholunate ligament. 4. Multiple small deep fluid collections in the hands are most compatible with hematoma given the heterogeneous T2 signal. In this patient with an elevated white blood cell count, superimposed infection is possible but this would be rare and in the setting of wrist trauma, ordinary hematoma is most likely. 5. Consider a noncontrast CT the wrist for further assessment of the distal radius fracture.   Electronically Signed   By: Andreas Newport M.D.   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 Other active problems: Sepsis, UTI, ARF coagulopathy, left radial fracture.  EXAM: CT OF THE RIGHT FEMUR WITHOUT CONTRAST   TECHNIQUE: Multidetector CT imaging was performed according to the standard protocol. Multiplanar CT image reconstructions were also generated.  COMPARISON:  None.  FINDINGS: 7.4 cm nearly isoechoic mass deep to the sartorius in the proximal thigh, along the course of the proximal SFA, which is not identified separately at the level of the process. Prominent right inguinal adenopathy up to 13 mm short axis diameter. Changes of knee hemiarthroplasty. Degenerative spurring in the lateral compartment of the knee. Small effusion in the suprapatellar bursa. No fracture or other acute bone abnormality.  IMPRESSION: 1. 7.4 cm process involving or adjacent to the proximal right SFA. Considerations include aneurysm, pseudoaneurysm, hematoma, mass.   Electronically Signed   By: Corlis Leak M.D.   On: 04/08/2015 12:54   Dg Chest Port 1 View  04/26/2015   CLINICAL DATA:  Confirm line placement  EXAM: PORTABLE CHEST 1 VIEW  COMPARISON:  April 20, 2015  FINDINGS: The heart size and mediastinal contours are stable. Right central venous line is identified with distal tip in the superior vena cava right atrial junction. Left central venous line is identified distal tip in superior vena cava unchanged. There is no pneumothorax. The lungs are clear. The heart size is enlarged. The visualized skeletal structures are stable.  IMPRESSION: Right central venous line distal tip in the superior vena  cava right atrial junction. There is no pneumothorax.   Electronically Signed   By: Sherian Rein M.D.   On: 04/26/2015 18:30   Dg Chest Port 1 View  04/20/2015   CLINICAL DATA:  Central line placement  EXAM: PORTABLE CHEST 1 VIEW  COMPARISON:  04/14/2015  FINDINGS: Left IJ venous catheter with its tip at the junction of the left brachiocephalic vein and SVC.  Visualized lungs are clear.  No pneumothorax.  Bilateral lung bases are excluded.  The visualized heart is unremarkable.  IMPRESSION: Left IJ venous catheter with its tip at the  junction of the left brachiocephalic vein and SVC.   Electronically Signed   By: Charline Bills M.D.   On: 04/20/2015 10:07   Dg Chest Portable 1 View  04/14/2015   CLINICAL DATA:  Encounter for central line placement  EXAM: PORTABLE CHEST - 1 VIEW  COMPARISON:  04/07/2015  FINDINGS: Cardiomediastinal silhouette is unremarkable. No acute infiltrate or pleural effusion. No pulmonary edema. There is left IJ central line with tip in SVC. No pneumothorax.  IMPRESSION: Left IJ central line with tip in SVC.  No pneumothorax.   Electronically Signed   By: Natasha Mead M.D.   On: 04/14/2015 16:17   Dg Shoulder Left  04/07/2015   CLINICAL DATA:  59 year old male with trauma and pain in the left upper extremity.  EXAM: LEFT SHOULDER - 2+ VIEW; LEFT FOREARM - 2 VIEW; LEFT ELBOW - COMPLETE 3+ VIEW; LEFT HUMERUS - 2+ VIEW; LEFT WRIST - COMPLETE 3+ VIEW; LEFT HAND - COMPLETE 3+ VIEW  COMPARISON:  None.  FINDINGS: There is no evidence of fracture or dislocation. There is no evidence of arthropathy or other focal bone abnormality. Soft tissues are unremarkable.  There is diffuse soft tissue swelling of the distal forearm and hand. No radiopaque foreign object identified. Clinical correlation is recommended.  IMPRESSION: No acute fracture or dislocation.  Soft tissue swelling of the distal forearm and hand.   Electronically Signed   By: Elgie Collard M.D.   On: 04/07/2015 02:23   Dg Knee Complete 4 Views Left  04/01/2015   CLINICAL DATA:  Patient fell at noon on 03/31/2015.  Left knee pain.  EXAM: LEFT KNEE - COMPLETE 4+ VIEW  COMPARISON:  12/30/2014  FINDINGS: Previous left total knee arthroplasty. Patellar femoral component is present. Components appear well seated. Mild diffuse soft tissue swelling similar to previous study. No significant effusion. No evidence of acute fracture or dislocation.  IMPRESSION: Postoperative left total knee arthroplasty. Components appear well seated. Diffuse soft tissue swelling similar  to prior study. No acute bony abnormalities.   Electronically Signed   By: Burman Nieves M.D.   On: 04/01/2015 01:58   Dg Humerus Left  04/07/2015   CLINICAL DATA:  59 year old male with trauma and pain in the left upper extremity.  EXAM: LEFT SHOULDER - 2+ VIEW; LEFT FOREARM - 2 VIEW; LEFT ELBOW - COMPLETE 3+ VIEW; LEFT HUMERUS - 2+ VIEW; LEFT WRIST - COMPLETE 3+ VIEW; LEFT HAND - COMPLETE 3+ VIEW  COMPARISON:  None.  FINDINGS: There is no evidence of fracture or dislocation. There is no evidence of arthropathy or other focal bone abnormality. Soft tissues are unremarkable.  There is diffuse soft tissue swelling of the distal forearm and hand. No radiopaque foreign object identified. Clinical correlation is recommended.  IMPRESSION: No acute fracture or dislocation.  Soft tissue swelling of the distal forearm and hand.   Electronically Signed   By: Burtis Junes  Radparvar M.D.   On: 04/07/2015 02:23   Dg Hand Complete Left  04/07/2015   CLINICAL DATA:  59 year old male with trauma and pain in the left upper extremity.  EXAM: LEFT SHOULDER - 2+ VIEW; LEFT FOREARM - 2 VIEW; LEFT ELBOW - COMPLETE 3+ VIEW; LEFT HUMERUS - 2+ VIEW; LEFT WRIST - COMPLETE 3+ VIEW; LEFT HAND - COMPLETE 3+ VIEW  COMPARISON:  None.  FINDINGS: There is no evidence of fracture or dislocation. There is no evidence of arthropathy or other focal bone abnormality. Soft tissues are unremarkable.  There is diffuse soft tissue swelling of the distal forearm and hand. No radiopaque foreign object identified. Clinical correlation is recommended.  IMPRESSION: No acute fracture or dislocation.  Soft tissue swelling of the distal forearm and hand.   Electronically Signed   By: Elgie Collard M.D.   On: 04/07/2015 02:23    Microbiology: No results found for this or any previous visit (from the past 240 hour(s)).   Labs: Basic Metabolic Panel:  Recent Labs Lab 04/24/15 0647 04/25/15 0355 04/27/15 0445 04/28/15 0426 04/29/15 0642  04/30/15 0520  NA  --  135 136 134* 136 134*  K  --  3.6 3.7 3.6 3.6 3.6  CL  --  98* 99* 97* 99* 98*  CO2  --  29 30 31 30 30   GLUCOSE  --  97 98 106* 99 104*  BUN  --  8 5* 6 <5* 7  CREATININE  --  0.94 0.85 0.93 0.81 0.92  CALCIUM  --  7.7* 7.9* 7.7* 7.9* 7.7*  MG 2.0  --   --   --   --   --   PHOS  --   --  3.6  --   --   --    Liver Function Tests:  Recent Labs Lab 04/25/15 0355 04/27/15 0445  AST 36  --   ALT 13*  --   ALKPHOS 133*  --   BILITOT 0.5  --   PROT 6.3*  --   ALBUMIN 1.5* 1.6*   No results for input(s): LIPASE, AMYLASE in the last 168 hours. No results for input(s): AMMONIA in the last 168 hours. CBC:  Recent Labs Lab 04/26/15 0403 04/27/15 0445 04/28/15 0426 04/29/15 0642 04/30/15 0520  WBC 8.6 8.1 8.0 9.5 10.1  NEUTROABS 5.7 5.2 5.1 6.3 6.3  HGB 8.4* 8.2* 8.3* 8.1* 8.2*  HCT 27.1* 26.2* 26.3* 25.7* 26.1*  MCV 88.0 87.9 87.1 87.7 87.3  PLT 398 378 372 382 364   Cardiac Enzymes: No results for input(s): CKTOTAL, CKMB, CKMBINDEX, TROPONINI in the last 168 hours. BNP: BNP (last 3 results) No results for input(s): BNP in the last 8760 hours.  ProBNP (last 3 results) No results for input(s): PROBNP in the last 8760 hours.  CBG: No results for input(s): GLUCAP in the last 168 hours.     Signed:  Sequan Auxier A  [facility] 04/30/2015, 11:01 AM

## 2015-04-30 NOTE — Progress Notes (Signed)
POD# 10 rectus flap to right groin/thigh, over sewing femoral vein  Temp:  [99.1 F (37.3 C)-99.7 F (37.6 C)] 99.1 F (37.3 C) (10/04 0454) Pulse Rate:  [98-103] 103 (10/04 0454) Resp:  [18-19] 19 (10/04 0454) BP: (153-194)/(85-105) 153/105 mmHg (10/04 0454) SpO2:  [95 %-100 %] 95 % (10/04 0454)   JP thigh 30 cc  Reports unable to bear wt on right foot  PE Abdomen and right groin sutures out- incisions dry intact   RLE proximal thigh : VAC removed, wound base granulated 100% 5.5 x 2.4 x 0.3cm  JP scant material   A/P S/p rectus muscle flap to prior abscess cavity over stumps of vessels  D/c thigh JP, d/c wound VAC  Start silver alginate every other day to right thigh and cover with dry dressing or foam.   Anticipate transfer to Rehab, still has staples in thigh and will remove these by week's end   Glenna Fellows, MD [facility]astic & Reconstruct[facility] Surgery [phone]

## 2015-04-30 NOTE — Progress Notes (Signed)
ANTICOAGULATION CONSULT NOTE  Pharmacy Consult for Warfarin Indication: R foot thromboembolism  Allergies  Allergen Reactions  . Imitrex [Sumatriptan] Other (See Comments)    Chest pain, 2004 (tolerates 50 mg prn)  . Verapamil Other (See Comments)    Causes pvc's   Patient Measurements: Height:  (190.5 cm) Weight: 228 lb (103.42 kg) IBW/kg (Calculated) : 84.5  Vital Signs: Temp: 99.1 F (37.3 C) (10/04 0454) Temp Source: Oral (10/04 0454) BP: 153/105 mmHg (10/04 0454) Pulse Rate: 103 (10/04 0454)  Labs:  Recent Labs  04/28/15 0426 04/29/15 0642 04/30/15 0520  HGB 8.3* 8.1* 8.2*  HCT 26.3* 25.7* 26.1*  PLT 372 382 364  LABPROT 22.3* 22.4* 23.4*  INR 1.97* 1.98* 2.10*  CREATININE 0.93 0.81 0.92   Estimated Creatinine Clearance: 115.4 mL/min (by C-G formula based on Cr of 0.92).  Medications:  Prescriptions prior to admission  Medication Sig Dispense Refill Last Dose  . albuterol (PROAIR HFA) 108 (90 BASE) MCG/ACT inhaler Inhale 1 puff into the lungs every 6 (six) hours as needed for wheezing or shortness of breath.    04/06/2015 at Unknown time  . atenolol (TENORMIN) 100 MG tablet Take 100 mg by mouth daily.   2-3 days ago at usually around 6am  . atorvastatin (LIPITOR) 80 MG tablet Take 80 mg by mouth daily.   few days ago  . clonazePAM (KLONOPIN) 2 MG tablet Take 2 mg by mouth 4 (four) times daily.   04/07/2015 at 1400  . Diclofenac-Misoprostol 75-0.2 MG TBEC Take 1 tablet by mouth 2 (two) times daily. Arthrotec  0 04/06/2015 at pm  . famciclovir (FAMVIR) 500 MG tablet Take 500 mg by mouth 2 (two) times daily as needed (fever blisters).    1-2 weeks ago  . ibuprofen (ADVIL,MOTRIN) 800 MG tablet Take 1 tablet (800 mg total) by mouth 3 (three) times daily. (Patient taking differently: Take 800 mg by mouth 2 (two) times daily as needed (pain). ) 21 tablet 0 3 weeks ago  . morphine (MS CONTIN) 30 MG 12 hr tablet Take 30 mg by mouth every 12 (twelve) hours. Take with a  60 mg tablet for a 90 mg dose  0 04/06/2015 at 900  . morphine (MS CONTIN) 60 MG 12 hr tablet Take 60 mg by mouth every 12 (twelve) hours. Take with a 30 mg tablet for a 90 mg dose  0 04/06/2015 at 900  . naloxegol oxalate (MOVANTIK) 25 MG TABS tablet Take 25 mg by mouth 2 (two) times daily.   04/05/2015  . ondansetron (ZOFRAN) 8 MG tablet Take 8 mg by mouth daily as needed for nausea or vomiting.   0 04/06/2015 at Unknown time  . OVER THE COUNTER MEDICATION Place 1 drop into both eyes 2 (two) times daily as needed (dry eyes). OTC lubricant eye drops   2-3 weeks ago  . oxycodone (ROXICODONE) 30 MG immediate release tablet Take 30 mg by mouth every 6 (six) hours. scheduled   04/06/2015 at pm  . SUMAtriptan (IMITREX) 50 MG tablet Take 50 mg by mouth daily as needed for migraine or headache. Maximum 2 doses in 2 days  0 couple weeks ago  . testosterone cypionate (DEPOTESTOSTERONE CYPIONATE) 200 MG/ML injection Inject 400 mg into the muscle once a week. On Wednesday  0 04/03/2015  . trifluridine (VIROPTIC) 1 % ophthalmic solution Place 1 drop into both eyes 2 (two) times daily as needed (breakouts (blurry, matted eyelids)).   3 weeks ago  . venlafaxine XR (  EFFEXOR-XR) 37.5 MG 24 hr capsule Take 37.5 mg by mouth daily with breakfast.   04/04/2015  . colchicine 0.6 MG tablet Take 1 tablet (0.6 mg total) by mouth 2 (two) times daily. (Patient not taking: Reported on 04/07/2015) 14 tablet 0 Not Taking at Unknown time  . oxyCODONE-acetaminophen (PERCOCET/ROXICET) 5-325 MG per tablet Take 1 tablet by mouth every 6 (six) hours as needed for severe pain. (Patient not taking: Reported on 04/07/2015) 10 tablet 0 Not Taking at Unknown time   Scheduled:  .  ceFAZolin (ANCEF) IV  2 g Intravenous 3 times per day  . docusate sodium  100 mg Oral BID  . feeding supplement (ENSURE ENLIVE)  237 mL Oral TID BM  . metoprolol tartrate  75 mg Oral BID  . morphine  90 mg Oral 2 times per day  . naloxegol oxalate  25 mg Oral Daily  .  sodium chloride  3 mL Intravenous Q12H  . venlafaxine XR  75 mg Oral Q breakfast  . Warfarin - Pharmacist Dosing Inpatient   Does not apply q1800   Infusions:  . sodium chloride 10 mL/hr at 04/26/15 1846   Assessment: 59yo male with long hospital course of heparin followed by argatroban and now lovenox for R foot thromboembolism. Pharmacy is consulted to dose warfarin for VTE treatment while bridged with enoxaparin. INR now therapeutic at 2.1 today. His CBC is (low) but stable and no noted bleeding complications.  His renal function remains normal.  Goal of Therapy:  INR 2-3 Monitor platelets by anticoagulation protocol: Yes   Plan:  Stop enoxaparin  Tullos Q12 Give coumadin  PO x 1 today Monitor daily INR, CBC, s/s of bleed Educate when more stable   Enzo Bi, PharmD Clinical Pharmacist Pager [phone] 04/30/2015 9:14 AM

## 2015-04-30 NOTE — Progress Notes (Signed)
Patient transferring to [facility] inpatient rehab. Report called and given to nurse. Patient ready for transfer.

## 2015-04-30 NOTE — H&P (View-Only) (Signed)
  Physical Medicine and Rehabilitation Admission H&P    Chief Complaint  Patient presents with  . Tachycardia  : HPI: [name] is a 59 y.o. right handed male history of hypertension, chronic knee pain, peptic ulcer disease, MRSA abscesses, peripheral neuropathy. Independent with occasional cane and walker prior to admission living with wife, in a home with facilities on the first floor and 4 steps to enter. Presented 04/07/2015 with recent fall 04/03/2015 with increasing left hand and wrist pain. In the ED noted to be hypotensive tachycardia and white blood cell count of 31,300. Placed on broad-spectrum anti-biotic's. MRI of left hand show dorsal distal radius transverse fracture, diffuse edema of the hand likely reactive to distal radius fracture, tear of the dorsal band of the scaphounate ligament. Multiple small deep fluid collections in the hands compatible with hematoma question superimposed infection. CT of right femur due to pain showed a 7.4 cm process involving or adjacent to the proximal right SFA question aneurysm, pseudoaneurysm or hematoma. 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 Chen. Follow-up infectious disease blood cultures positive bacillus/MSSA and remains on broad-spectrum antibiotics as advised. Patient returned to the OR 04/13/2015 for a repeat incision and drainage of thigh abscess negative pressure dressing placement as well as washout of right thigh abscess cavity 04/15/2015 and 04/17/2015 with rectus flap procedure 04/19/2015 per plastic surgery Dr Thimmappa and wound VAC change 04/23/2015. . TTE did not show valvular insufficiency or abscess. Hospital course tachycardia/SVT (requiring adenosine) with cardiology services consulted Dr. Ganji. Echocardiogram with ejection fraction of 60% grade 1 diastolic dysfunction. In regards to abscess of left dorsal  hand underwent incision and drainage 04/23/2015 per Dr. Coley. Vascular study left upper extremity shows no DVT. Patient developed gangrenous thromboembolic injury to the right foot and toes with consult Dr. Duda. No plan for invasive procedures at this time with wound care nurse follow-up for skin care. Patient placed on Coumadin for thromboembolism as per Dr. Duda. Physical and occupational therapy evaluations completed 04/24/2015 with recommendations of physical medicine rehabilitation consult. Patient was admitted for comprehensive rehabilitation program  ROS Constitutional: Negative for fever and chills.  HENT: Negative for hearing loss.  Eyes: Negative for blurred vision and double vision.  Respiratory: Negative for cough.  Cardiovascular: Positive for leg swelling. Negative for chest pain and palpitations.  Gastrointestinal: Negative for nausea and vomiting.  Genitourinary: Negative for dysuria and hematuria.  Musculoskeletal: Positive for myalgias, joint pain and falls.   Chronic knee pain  Skin: Negative for rash.  Neurological: Positive for weakness and headaches. Negative for seizures and loss of consciousness.  All other systems reviewed and are negative   Past Medical History  Diagnosis Date  . Hypertension   . Chronic knee pain   . Hyperlipemia   . PUD (peptic ulcer disease)    Past Surgical History  Procedure Laterality Date  . I&d extremity Left 07/12/2013    Procedure: I&D Left Thigh Abscess;  Surgeon: John [name], MD;  Location: [facility] OR;  Service: Orthopedics;  Laterality: Left;  . Replacement total knee bilateral    . Tonsillectomy    . Hernia repair    . Cholecystectomy    . Appendectomy    . Shoulder surgery    . Elbow surgery    . Femoral artery exploration Right 04/11/2015    Procedure: Resection of infected right femoral artery aneurysm;  Surgeon: Brian L Chen, MD;    Location: [facility] OR;  Service: Vascular;  Laterality: Right;  . Incision and drainage  abscess Right 04/11/2015    Procedure: INCISION AND DRAINAGE OF RIGHT THIGH ABSCESS;  Surgeon: Conrad Montalvin Manor, MD;  Location: [place];  Service: Vascular;  Laterality: Right;  . Incision and drainage abscess Right 04/13/2015    Procedure: INCISION AND DRAINAGE THIGH ABSCESS;  Surgeon: Conrad Dodge [place], MD;  Location: [place];  Service: Vascular;  Laterality: Right;  . I&d extremity Left 04/17/2015    Procedure: IRRIGATION AND DEBRIDEMENT LEFT EXTREMITY;  Surgeon: Conrad [place], MD;  Location: [place];  Service: Vascular;  Laterality: Left;  Marland Kitchen Muscle flap closure Right 04/19/2015    Procedure: RIGHT RECTUS ABDOMINUS  FLAP TO RIGHT GROIN;  Surgeon: Irene Limbo, MD;  Location: [place];  Service: Plastics;  Laterality: Right;  . Femoral revision Right 04/19/2015    Procedure: OVERSEW RIGHT FEMORAL VEIN ;  Surgeon: Angelia Mould, MD;  Location: [place];  Service: Vascular;  Laterality: Right;  . Wound exploration Right 04/23/2015    Procedure: EXAM UNDER ANESTHESIA AND VAC CHANGE RIGHT THIGH;  Surgeon: Irene Limbo, MD;  Location: [place];  Service: Plastics;  Laterality: Right;   Family History  Problem Relation Age of Onset  . Heart attack Paternal Grandmother 52  . Heart disease Mother 18    arrythmia  . Stroke Paternal Grandfather   . Stroke Maternal Grandfather    Social History:  reports that he has quit smoking. His smoking use included Cigarettes. He does not have any smokeless tobacco history on file. He reports that he does not drink alcohol or use illicit drugs. Allergies:  Allergies  Allergen Reactions  . Imitrex [Sumatriptan] Other (See Comments)    Chest pain, 2004 (tolerates 50 mg prn)  . Verapamil Other (See Comments)    Causes pvc's   Medications Prior to Admission  Medication Sig Dispense Refill  . albuterol (PROAIR HFA) 108 (90 BASE) MCG/ACT inhaler Inhale 1 puff into the lungs every 6 (six) hours as needed for wheezing or shortness of breath.     Marland Kitchen atenolol (TENORMIN) 100 MG  tablet Take 100 mg by mouth daily.    Marland Kitchen atorvastatin (LIPITOR) 80 MG tablet Take 80 mg by mouth daily.    . clonazePAM (KLONOPIN) 2 MG tablet Take 2 mg by mouth 4 (four) times daily.    . Diclofenac-Misoprostol 75-0.2 MG TBEC Take 1 tablet by mouth 2 (two) times daily. Arthrotec  0  . famciclovir (FAMVIR) 500 MG tablet Take 500 mg by mouth 2 (two) times daily as needed (fever blisters).     Marland Kitchen ibuprofen (ADVIL,MOTRIN) 800 MG tablet Take 1 tablet (800 mg total) by mouth 3 (three) times daily. (Patient taking differently: Take 800 mg by mouth 2 (two) times daily as needed (pain). ) 21 tablet 0  . morphine (MS CONTIN) 30 MG 12 hr tablet Take 30 mg by mouth every 12 (twelve) hours. Take with a 60 mg tablet for a 90 mg dose  0  . morphine (MS CONTIN) 60 MG 12 hr tablet Take 60 mg by mouth every 12 (twelve) hours. Take with a 30 mg tablet for a 90 mg dose  0  . naloxegol oxalate (MOVANTIK) 25 MG TABS tablet Take 25 mg by mouth 2 (two) times daily.    . ondansetron (ZOFRAN) 8 MG tablet Take 8 mg by mouth daily as needed for nausea or vomiting.   0  . OVER THE COUNTER MEDICATION  Place 1 drop into both eyes 2 (two) times daily as needed (dry eyes). OTC lubricant eye drops    . oxycodone (ROXICODONE) 30 MG immediate release tablet Take 30 mg by mouth every 6 (six) hours. scheduled    . SUMAtriptan (IMITREX) 50 MG tablet Take 50 mg by mouth daily as needed for migraine or headache. Maximum 2 doses in 2 days  0  . testosterone cypionate (DEPOTESTOSTERONE CYPIONATE) 200 MG/ML injection Inject 400 mg into the muscle once a week. On Wednesday  0  . trifluridine (VIROPTIC) 1 % ophthalmic solution Place 1 drop into both eyes 2 (two) times daily as needed (breakouts (blurry, matted eyelids)).    . venlafaxine XR (EFFEXOR-XR) 37.5 MG 24 hr capsule Take 37.5 mg by mouth daily with breakfast.    . colchicine 0.6 MG tablet Take 1 tablet (0.6 mg total) by mouth 2 (two) times daily. (Patient not taking: Reported on  04/07/2015) 14 tablet 0  . oxyCODONE-acetaminophen (PERCOCET/ROXICET) 5-325 MG per tablet Take 1 tablet by mouth every 6 (six) hours as needed for severe pain. (Patient not taking: Reported on 04/07/2015) 10 tablet 0    Home: Home Living Family/patient expects to be discharged to:: Private residence Living Arrangements: Spouse/significant other, Children (6 y.o. grandson) Available Help at Discharge: Family, Available 24 hours/day Type of Home: House Home Access: Stairs to enter Entrance Stairs-Number of Steps: 4 Entrance Stairs-Rails: Right, Left Home Layout: Two level, 1/2 bath on main level Bathroom Shower/Tub: Walk-in shower (upstairs) Bathroom Toilet: Standard Home Equipment: Walker - 2 wheels, Cane - single point, Bedside commode   Functional History: Prior Function Level of Independence: Independent Comments: pt is on disability, former pharmaceutical salesman  Functional Status:  Mobility: Bed Mobility Overal bed mobility: Needs Assistance Bed Mobility: Supine to Sit Supine to sit: Min assist, HOB elevated (use of rail) Sit to supine: Mod assist, +2 for physical assistance General bed mobility comments: pt much more aggressive with his assist today. Transfers Overall transfer level: Needs assistance Equipment used:  (chairback for standing assist) Transfers: Sit to/from Stand, Squat Pivot Transfers Sit to Stand: Mod assist, +2 physical assistance Squat pivot transfers: Mod assist General transfer comment: assist to come forward and initial support at R knee      ADL: ADL Overall ADL's : Needs assistance/impaired Eating/Feeding: Set up, Bed level Grooming: Brushing hair, Oral care, Sitting, Supervision/safety Upper Body Bathing: Sitting, Moderate assistance Lower Body Bathing: Total assistance, +2 for physical assistance, Bed level Upper Body Dressing : Sitting, Moderate assistance Upper Body Dressing Details (indicate cue type and reason): instructed to dress L  UE first Lower Body Dressing: +2 for physical assistance, Total assistance, Bed level General ADL Comments: Performed retrograde massage to L UE and elevated on 3 pillows.  Instructed pt to remind staff to elevate L UE when positioning pt in bed or chair.  Cognition: Cognition Overall Cognitive Status: Within Functional Limits for tasks assessed Orientation Level: Oriented X4 Cognition Arousal/Alertness: Awake/alert Behavior During Therapy: WFL for tasks assessed/performed Overall Cognitive Status: Within Functional Limits for tasks assessed  Physical Exam: Blood pressure 163/73, pulse 96, temperature 99.5 F (37.5 C), temperature source Oral, resp. rate 18, height 6' 3" (1.905 m), weight 103.42 kg (228 lb), SpO2 97 %. Physical Exam Constitutional: He is oriented to person, place, and time. He appears well-developed and well-nourished.  HENT:  Head: Normocephalic and atraumatic.  Eyes: Conjunctivae and EOM are normal.  Neck: Normal range of motion. Neck supple. No thyromegaly present.  Cardiovascular:   Normal rate and regular rhythm.  Respiratory: Effort normal and breath sounds normal. No respiratory distress.  GI: Soft. Bowel sounds are normal. He exhibits no distension.  Genitourinary:  +Foley  Musculoskeletal: He exhibits edema (LUE) (improving).  RUE 5/5 LUE 3+/5 shoulder abduction, elbow flex/ext; 3/5 finger grip LLE 5/5  RLE 2/5 hip flexion, 2/5 ankle dorsi/plantar flexion  Neurological: He is alert and oriented to person, place, and time.  Skin: Skin is warm and dry.  Right foot dressing in placed was some dried blood on the dressing.  RLE with VAC and drain in place Left upper extremity wrist dressing in place appropriately tender   Results for orders placed or performed during the hospital encounter of 04/06/15 (from the past 48 hour(s))  CBC with Differential/Platelet     Status: Abnormal   Collection Time: 04/28/15  4:26 AM  Result Value Ref Range   WBC 8.0 4.0  - 10.5 K/uL   RBC 3.02 (L) 4.22 - 5.81 MIL/uL   Hemoglobin 8.3 (L) 13.0 - 17.0 g/dL   HCT 26.3 (L) 39.0 - 52.0 %   MCV 87.1 78.0 - 100.0 fL   MCH 27.5 26.0 - 34.0 pg   MCHC 31.6 30.0 - 36.0 g/dL   RDW 15.6 (H) 11.5 - 15.5 %   Platelets 372 150 - 400 K/uL   Neutrophils Relative % 63 %   Neutro Abs 5.1 1.7 - 7.7 K/uL   Lymphocytes Relative 17 %   Lymphs Abs 1.3 0.7 - 4.0 K/uL   Monocytes Relative 16 %   Monocytes Absolute 1.3 (H) 0.1 - 1.0 K/uL   Eosinophils Relative 3 %   Eosinophils Absolute 0.2 0.0 - 0.7 K/uL   Basophils Relative 1 %   Basophils Absolute 0.0 0.0 - 0.1 K/uL  Protime-INR     Status: Abnormal   Collection Time: 04/28/15  4:26 AM  Result Value Ref Range   Prothrombin Time 22.3 (H) 11.6 - 15.2 seconds   INR 1.97 (H) 0.00 - 1.49  Basic metabolic panel     Status: Abnormal   Collection Time: 04/28/15  4:26 AM  Result Value Ref Range   Sodium 134 (L) 135 - 145 mmol/L   Potassium 3.6 3.5 - 5.1 mmol/L   Chloride 97 (L) 101 - 111 mmol/L   CO2 31 22 - 32 mmol/L   Glucose, Bld 106 (H) 65 - 99 mg/dL   BUN 6 6 - 20 mg/dL   Creatinine, Ser 0.93 0.61 - 1.24 mg/dL   Calcium 7.7 (L) 8.9 - 10.3 mg/dL   GFR calc non Af Amer >60 >60 mL/min   GFR calc Af Amer >60 >60 mL/min    Comment: (NOTE) The eGFR has been calculated using the CKD EPI equation. This calculation has not been validated in all clinical situations. eGFR's persistently <60 mL/min signify possible Chronic Kidney Disease.    Anion gap 6 5 - 15  Urinalysis, Routine w reflex microscopic (not at [facility])     Status: Abnormal   Collection Time: 04/28/15  9:20 AM  Result Value Ref Range   Color, Urine YELLOW YELLOW   APPearance CLOUDY (A) CLEAR   Specific Gravity, Urine 1.013 1.005 - 1.030   pH 7.5 5.0 - 8.0   Glucose, UA NEGATIVE NEGATIVE mg/dL   Hgb urine dipstick LARGE (A) NEGATIVE   Bilirubin Urine NEGATIVE NEGATIVE   Ketones, ur NEGATIVE NEGATIVE mg/dL   Protein, ur 100 (A) NEGATIVE mg/dL   Urobilinogen,  UA 1.0 0.0 -   1.0 mg/dL   Nitrite NEGATIVE NEGATIVE   Leukocytes, UA NEGATIVE NEGATIVE  Urine microscopic-add on     Status: Abnormal   Collection Time: 04/28/15  9:20 AM  Result Value Ref Range   WBC, UA 3-6 <3 WBC/hpf   RBC / HPF TOO NUMEROUS TO COUNT <3 RBC/hpf   Casts HYALINE CASTS (A) NEGATIVE    Comment: WBC CAST  CBC with Differential/Platelet     Status: Abnormal   Collection Time: 04/29/15  6:42 AM  Result Value Ref Range   WBC 9.5 4.0 - 10.5 K/uL   RBC 2.93 (L) 4.22 - 5.81 MIL/uL   Hemoglobin 8.1 (L) 13.0 - 17.0 g/dL   HCT 25.7 (L) 39.0 - 52.0 %   MCV 87.7 78.0 - 100.0 fL   MCH 27.6 26.0 - 34.0 pg   MCHC 31.5 30.0 - 36.0 g/dL   RDW 15.6 (H) 11.5 - 15.5 %   Platelets 382 150 - 400 K/uL   Neutrophils Relative % 66 %   Neutro Abs 6.3 1.7 - 7.7 K/uL   Lymphocytes Relative 17 %   Lymphs Abs 1.6 0.7 - 4.0 K/uL   Monocytes Relative 14 %   Monocytes Absolute 1.4 (H) 0.1 - 1.0 K/uL   Eosinophils Relative 3 %   Eosinophils Absolute 0.3 0.0 - 0.7 K/uL   Basophils Relative 0 %   Basophils Absolute 0.0 0.0 - 0.1 K/uL  Protime-INR     Status: Abnormal   Collection Time: 04/29/15  6:42 AM  Result Value Ref Range   Prothrombin Time 22.4 (H) 11.6 - 15.2 seconds   INR 1.98 (H) 0.00 - 3.09  Basic metabolic panel     Status: Abnormal   Collection Time: 04/29/15  6:42 AM  Result Value Ref Range   Sodium 136 135 - 145 mmol/L   Potassium 3.6 3.5 - 5.1 mmol/L   Chloride 99 (L) 101 - 111 mmol/L   CO2 30 22 - 32 mmol/L   Glucose, Bld 99 65 - 99 mg/dL   BUN <5 (L) 6 - 20 mg/dL   Creatinine, Ser 0.81 0.61 - 1.24 mg/dL   Calcium 7.9 (L) 8.9 - 10.3 mg/dL   GFR calc non Af Amer >60 >60 mL/min   GFR calc Af Amer >60 >60 mL/min    Comment: (NOTE) The eGFR has been calculated using the CKD EPI equation. This calculation has not been validated in all clinical situations. eGFR's persistently <60 mL/min signify possible Chronic Kidney Disease.    Anion gap 7 5 - 15   No results  found.     Medical Problem List and Plan: 1. Functional deficits secondary to debilitation/sepsis/multi-medical/MRSA abscesses with multiple irrigation and debridements with rectus flap procedure 04/19/2015 2.  DVT Prophylaxis/Anticoagulation: Coumadin for thromboembolism. Monitor for any bleeding episodes 3. Pain Management: MS Contin 90 mg twice a day, oxycodone for breakthrough pain. Monitor with increased mobility 4. Mood: Effexor XR 75 mg daily, Klonopin 0.5 mg 4 times a day as needed 5. Neuropsych: This patient is capable of making decisions on his own behalf. 6. Skin/Wound Care: Skin care is supervised to right foot. Follow-up outpatient with Dr. Sharol Given 7. Fluids/Electrolytes/Nutrition: Strict I and O's with follow-up chemistries 8.ID. Presently on Ancef 2 g 3 times a day. Will confirm duration of antibodies with infectious disease 9. Tachycardia SVT. Follow-up cardiology services. Continue Lopressor 50 mg twice a day for now 10. Constipation. Laxitive  assistance   Post Admission Physician Evaluation: 1. Functional deficits secondary  to debilitation/sepsis/multi-medical/MRSA abscesses with multiple irrigation and debridements with rectus flap procedure 04/19/2015. 2. Patient is admitted to receive collaborative, interdisciplinary care between the physiatrist, rehab nursing staff, and therapy team. 3. Patient's level of medical complexity and substantial therapy needs in context of that medical necessity cannot be provided at a lesser intensity of care such as a SNF. 4. Patient has experienced substantial functional loss from his/her baseline which was documented above under the "Functional History" and "Functional Status" headings.  Judging by the patient's diagnosis, physical exam, and functional history, the patient has potential for functional progress which will result in measurable gains while on inpatient rehab.  These gains will be of substantial and practical use upon discharge   in facilitating mobility and self-care at the household level. 5. Physiatrist will provide 24 hour management of medical needs as well as oversight of the therapy plan/treatment and provide guidance as appropriate regarding the interaction of the two. 6. 24 hour rehab nursing will assist with bladder management, safety, skin/wound care, disease management, medication administration, pain management and patient education  and help integrate therapy concepts, techniques,education, etc. 7. PT will assess and treat for/with: Lower extremity strength, range of motion, stamina, balance, functional mobility, safety, adaptive techniques and equipment, with focus on strengthening RLE.   Goals are: Min A/Supervision. 8. OT will assess and treat for/with: ADL's, functional mobility, safety, upper extremity strength, adaptive techniques and equipment, focusing on improving strength in LUE.   Goals are: supervision/Mod I. Therapy may not proceed with showering this patient. 9. Case Management and Social Worker will assess and treat for psychological issues and discharge planning. 10. Team conference will be held weekly to assess progress toward goals and to determine barriers to discharge. 11. Patient will receive at least 3 hours of therapy per day at least 5 days per week. 12. ELOS: 13-17 days       13. Prognosis:  good  Delice Lesch, MD 04/29/2015

## 2015-04-30 NOTE — Interval H&P Note (Signed)
[name] was admitted today to Inpatient Rehabilitation with the diagnosis of debilitation/sepsis/multi-medical/MRSA abscesses with multiple irrigation and debridements with rectus flap procedure 04/19/2015.  The patient's history has been reviewed, patient examined, and there is no change in status.  Patient continues to be appropriate for intensive inpatient rehabilitation.  I have reviewed the patient's chart and labs.  Questions were answered to the patient's satisfaction. The PAPE has been reviewed and assessment remains appropriate.  [name] 04/30/2015, 7:07 PM

## 2015-05-01 ENCOUNTER — Inpatient Hospital Stay (HOSPITAL_COMMUNITY): Payer: Medicaid Other | Admitting: Physical Therapy

## 2015-05-01 ENCOUNTER — Inpatient Hospital Stay (HOSPITAL_COMMUNITY): Payer: 59 | Admitting: Occupational Therapy

## 2015-05-01 DIAGNOSIS — A4102 Sepsis due to Methicillin resistant Staphylococcus aureus: Secondary | ICD-10-CM

## 2015-05-01 DIAGNOSIS — R5381 Other malaise: Secondary | ICD-10-CM | POA: Diagnosis present

## 2015-05-01 LAB — COMPREHENSIVE METABOLIC PANEL
ALT: 12 U/L — AB (ref 17–63)
AST: 34 U/L (ref 15–41)
Albumin: 1.6 g/dL — ABNORMAL LOW (ref 3.5–5.0)
Alkaline Phosphatase: 136 U/L — ABNORMAL HIGH (ref 38–126)
Anion gap: 6 (ref 5–15)
BUN: 6 mg/dL (ref 6–20)
CHLORIDE: 99 mmol/L — AB (ref 101–111)
CO2: 29 mmol/L (ref 22–32)
Calcium: 7.9 mg/dL — ABNORMAL LOW (ref 8.9–10.3)
Creatinine, Ser: 0.91 mg/dL (ref 0.61–1.24)
GFR calc Af Amer: >60 mL/min (ref >60)
Glucose, Bld: 104 mg/dL — ABNORMAL HIGH (ref 65–99)
POTASSIUM: 3.8 mmol/L (ref 3.5–5.1)
SODIUM: 134 mmol/L — AB (ref 135–145)
Total Bilirubin: 0.6 mg/dL (ref 0.3–1.2)
Total Protein: 6.4 g/dL — ABNORMAL LOW (ref 6.5–8.1)

## 2015-05-01 LAB — CBC WITH DIFFERENTIAL/PLATELET
BASOS ABS: 0.1 10*3/uL (ref 0.0–0.1)
BASOS PCT: 1 %
EOS ABS: 0.2 10*3/uL (ref 0.0–0.7)
EOS PCT: 2 %
HCT: 24.7 % — ABNORMAL LOW (ref 39.0–52.0)
Hemoglobin: 7.9 g/dL — ABNORMAL LOW (ref 13.0–17.0)
Lymphocytes Relative: 22 %
Lymphs Abs: 2 10*3/uL (ref 0.7–4.0)
MCH: 27.7 pg (ref 26.0–34.0)
MCHC: 32 g/dL (ref 30.0–36.0)
MCV: 86.7 fL (ref 78.0–100.0)
MONO ABS: 1.2 10*3/uL — AB (ref 0.1–1.0)
Monocytes Relative: 13 %
Neutro Abs: 5.5 10*3/uL (ref 1.7–7.7)
Neutrophils Relative %: 62 %
PLATELETS: 333 10*3/uL (ref 150–400)
RBC: 2.85 MIL/uL — AB (ref 4.22–5.81)
RDW: 15.6 % — AB (ref 11.5–15.5)
WBC: 8.9 10*3/uL (ref 4.0–10.5)

## 2015-05-01 LAB — PROTIME-INR
INR: 2.1 — ABNORMAL HIGH (ref 0.00–1.49)
PROTHROMBIN TIME: 23.4 s — AB (ref 11.6–15.2)

## 2015-05-01 MED ORDER — WARFARIN SODIUM 7.5 MG PO TABS
7.5000 mg | ORAL_TABLET | Freq: Once | ORAL | Status: AC
  Administered 2015-05-01: 7.5 mg via ORAL
  Filled 2015-05-01: qty 1

## 2015-05-01 NOTE — Progress Notes (Signed)
59 y.o. right handed male history of hypertension, chronic knee pain, peptic ulcer disease, MRSA abscesses, peripheral neuropathy. Independent with occasional cane and walker prior to admission living with wife, in a home with facilities on the first floor and 4 steps to enter. Presented 04/07/2015 with recent fall 04/03/2015 with increasing left hand and wrist pain. In the ED noted to be hypotensive tachycardia and white blood cell count of 31,300. Placed on broad-spectrum anti-biotic's. MRI of left hand show dorsal distal radius transverse fracture, diffuse edema of the hand likely reactive to distal radius fracture, tear of the dorsal band of the scaphounate ligament. Multiple small deep fluid collections in the hands compatible with hematoma question superimposed infection. CT of right femur due to pain showed a 7.4 cm process involving or adjacent to the proximal right SFA question aneurysm, pseudoaneurysm or hematoma. 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 Bridgett Larsson. Follow-up infectious disease blood cultures positive bacillus/MSSA and remains on broad-spectrum antibiotics as advised. Patient returned to the OR 04/13/2015 for a repeat incision and drainage of thigh abscess negative pressure dressing placement as well as washout of right thigh abscess cavity 04/15/2015 and 04/17/2015 with rectus flap procedure 04/19/2015 per plastic surgery Dr Iran Planas and wound VAC change 04/23/2015. . TTE did not show valvular insufficiency or abscess. Hospital course tachycardia/SVT (requiring adenosine) with cardiology services consulted Dr. Einar Gip. Echocardiogram with ejection fraction of 83% grade 1 diastolic dysfunction. In regards to abscess of left dorsal hand underwent incision and drainage 04/23/2015 per Dr. Lenon Curt  Subjective/Complaints: PT c/o fatigue with ADLs  No SOB or CP  Knee pain  is chronic and is the most troublesome pain today.  No abd pain or R foot or L wrist pain noted  Objective: Vital Signs: Blood pressure 101/69, pulse 92, temperature 98.9 F (37.2 C), temperature source Oral, resp. rate 19, SpO2 98 %. No results found. Results for orders placed or performed during the hospital encounter of 04/30/15 (from the past 72 hour(s))  CBC WITH DIFFERENTIAL     Status: Abnormal   Collection Time: 05/01/15  5:35 AM  Result Value Ref Range   WBC 8.9 4.0 - 10.5 K/uL   RBC 2.85 (L) 4.22 - 5.81 MIL/uL   Hemoglobin 7.9 (L) 13.0 - 17.0 g/dL   HCT 24.7 (L) 39.0 - 52.0 %   MCV 86.7 78.0 - 100.0 fL   MCH 27.7 26.0 - 34.0 pg   MCHC 32.0 30.0 - 36.0 g/dL   RDW 15.6 (H) 11.5 - 15.5 %   Platelets 333 150 - 400 K/uL   Neutrophils Relative % 62 %   Neutro Abs 5.5 1.7 - 7.7 K/uL   Lymphocytes Relative 22 %   Lymphs Abs 2.0 0.7 - 4.0 K/uL   Monocytes Relative 13 %   Monocytes Absolute 1.2 (H) 0.1 - 1.0 K/uL   Eosinophils Relative 2 %   Eosinophils Absolute 0.2 0.0 - 0.7 K/uL   Basophils Relative 1 %   Basophils Absolute 0.1 0.0 - 0.1 K/uL  Comprehensive metabolic panel     Status: Abnormal   Collection Time: 05/01/15  5:35 AM  Result Value Ref Range   Sodium 134 (L) 135 - 145 mmol/L   Potassium 3.8 3.5 - 5.1 mmol/L   Chloride 99 (L) 101 - 111 mmol/L   CO2 29 22 - 32 mmol/L   Glucose, Bld 104 (H) 65 - 99 mg/dL   BUN  6 6 - 20 mg/dL   Creatinine, Ser 0.91 0.61 - 1.24 mg/dL   Calcium 7.9 (L) 8.9 - 10.3 mg/dL   Total Protein 6.4 (L) 6.5 - 8.1 g/dL   Albumin 1.6 (L) 3.5 - 5.0 g/dL   AST 34 15 - 41 U/L   ALT 12 (L) 17 - 63 U/L   Alkaline Phosphatase 136 (H) 38 - 126 U/L   Total Bilirubin 0.6 0.3 - 1.2 mg/dL   GFR calc non Af Amer >60 >60 mL/min   GFR calc Af Amer >60 >60 mL/min    Comment: (NOTE) The eGFR has been calculated using the CKD EPI equation. This calculation has not been validated in all clinical situations. eGFR's persistently <60 mL/min signify possible  Chronic Kidney Disease.    Anion gap 6 5 - 15  Protime-INR     Status: Abnormal   Collection Time: 05/01/15  5:35 AM  Result Value Ref Range   Prothrombin Time 23.4 (H) 11.6 - 15.2 seconds   INR 2.10 (H) 0.00 - 1.49     HEENT: normal Cardio: RRR and no murmur Resp: CTA B/L and unlabored GI: BS positive and NT, ND Extremity:  Pulses positive and No Edema Skin:   Breakdown Right foot with erythema and small areas of black eschar on great toe and Wound midline abd incision with staples Neuro: Alert/Oriented, Anxious, Abnormal Sensory reduced sensation to LT in left fingers and Right medial and ant thigh and Abnormal Motor 4/5 B Knee ext , 3- Left hand grip, 3- Right ankle , 4- Left ankle , RUE normal Musc/Skel:  Other healed incision bilateral knee Gen NAD except complained of fatigue with minmal activity   Assessment/Plan: 1. Functional deficits secondary  to debilitation/sepsis/multi-medical/MRSA abscesses with multiple irrigation and debridements with rectus flap procedure 04/19/2015  which require 3+ hours per day of interdisciplinary therapy in a comprehensive inpatient rehab setting. Physiatrist is providing close team supervision and 24 hour management of active medical problems listed below. Physiatrist and rehab team continue to assess barriers to discharge/monitor patient progress toward functional and medical goals. Team conference today please see physician documentation under team conference tab, met with team face-to-face to discuss problems,progress, and goals. Formulized individual treatment plan based on medical history, underlying problem and comorbidities. May need 15/7 schedule FIM:         Medical Problem List and Plan: 1. Functional deficits secondary to debilitation/sepsis/multi-medical/MRSA abscesses with multiple irrigation and debridements with rectus flap procedure 04/19/2015 2.  DVT Prophylaxis/Anticoagulation: Coumadin for thromboembolism. Monitor for any  bleeding episodes 3. Pain Management: MS Contin 90 mg twice a day, oxycodone for breakthrough pain. Monitor with increased mobility 4. Mood: Effexor XR 75 mg daily, Klonopin 0.5 mg 4 times a day as needed 5. Neuropsych: This patient is capable of making decisions on his own behalf. 6. Skin/Wound Care: Skin care is supervised to right foot. 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 Sharol Given 7. Fluids/Electrolytes/Nutrition: Strict I and O's with follow-up chemistries 8.ID. Presently on Ancef 2 g 3 times a day. ID to address duration 9. Tachycardia SVT. Follow-up cardiology services. Continue Lopressor 50 mg twice a day for now 10. Constipation. Laxitive  assistance                            LOS (Days) 1 A FACE TO FACE EVALUATION WAS PERFORMED  Jeffrey Graefe E 05/01/2015, 8:42 AM

## 2015-05-01 NOTE — Progress Notes (Signed)
Occupational Therapy Session Note  Patient Details  Name: SMAYAN HACKBART MRN: [id number] Date of Birth: 06/02/1956  Today's Date: 05/01/2015 OT Individual Time: 1400-1430 OT Individual Time Calculation (min): 30 min    Short Term Goals: Week 1:  OT Short Term Goal 1 (Week 1): Pt will transfer to [facility] with +1 max A  OT Short Term Goal 2 (Week 1): Pt will don shirt with setup OT Short Term Goal 3 (Week 1): Pt will thread LB clothing with mod A  OT Short Term Goal 4 (Week 1): Pt will perform bed mobility supine <>sit with min A with extra time OT Short Term Goal 5 (Week 1): Pt will perform sit to stand with max A in prep for clothing management   Skilled Therapeutic Interventions/Progress Updates:  Pt seen for OT therapy session focusing on ADL re-training and education. Pt in supine upon arrival, agreeable to tx session. He required mod VCs to recall previous therapy sessions, however, once oriented was able to recall tasks completed in previous sessions. He completed oral care from bed level with set-up. He was educated extensively regarding role of OT, POC, OT goals and rate of progression. Introduced pt to drop arm BSC, recommending nursing use maximove with pt at this time and will work with therapies in using sliding board to get to [facility].  Retrograde massage performed to pt's L UE with education provided regarding purpose and technique for massage, pt voiced understanding and comfort with message.  Pt left in supine at end of session, all needs in reach.   Therapy Documentation Precautions:  Precautions Precautions: Fall Precaution Comments: no specific weight bearing order but avoided weight bearing through L wrist; wound to right LE and groin Restrictions Weight Bearing Restrictions: Yes Other Position/Activity Restrictions: see above General: General Chart Reviewed: Yes Family/Caregiver Present: No Pain:   Voiced consistent discomfort in L wrist, no numerical value given.  Retrograde massage performed to L UE ADL: ADL ADL Comments: see Care tool function  See Function Navigator for Current Functional Status.   Therapy/Group: Individual Therapy  [name], Chett Taniguchi C 05/01/2015, 3:22 PM

## 2015-05-01 NOTE — Progress Notes (Signed)
Patient information reviewed and entered into eRehab system by Espn Zeman, RN, CRRN, PPS Coordinator.  Information including medical coding and functional independence measure will be reviewed and updated through discharge.    

## 2015-05-01 NOTE — Progress Notes (Signed)
[facility] Individual Statement of Services  Patient Name:  [name]  Date:  05/01/2015  Welcome to the [facility].  Our goal is to provide you with an individualized program based on your diagnosis and situation, designed to meet your specific needs.  With this comprehensive rehabilitation program, you will be expected to participate in at least 3 hours of rehabilitation therapies Monday-Friday, with modified therapy programming on the weekends.  Your rehabilitation program will include the following services:  Physical Therapy (PT), Occupational Therapy (OT), 24 hour per day rehabilitation nursing, Neuropsychology, Case Management (Social Worker), Rehabilitation Medicine, Nutrition Services and Pharmacy Services  Weekly team conferences will be held on Wednesdays to discuss your progress.  Your Social Worker will talk with you frequently to get your input and to update you on team discussions.  Team conferences with you and your family in attendance may also be held.  Expected length of stay:  3 weeks  Overall anticipated outcome:  Supervision with some minimal assistance  Depending on your progress and recovery, your program may change. Your Social Worker will coordinate services and will keep you informed of any changes. Your Social Worker's name and contact numbers are listed  below.  The following services may also be recommended but are not provided by the [facility]:   Driving Evaluations  Home Health Rehabiltiation Services  Outpatient Rehabilitation Services   Arrangements will be made to provide these services after discharge if needed.  Arrangements include referral to agencies that provide these services.  Your insurance has been verified to be:  Medicaid Your primary doctor is:  Dr. [name]  Pertinent information will be shared with your doctor and your insurance company.  Social Worker:  Staci Acosta,  LCSW  [phone] or ([phone]  Information discussed with and copy given to patient by: Elvera Lennox, 05/01/2015, 12:17 PM

## 2015-05-01 NOTE — Evaluation (Signed)
Occupational Therapy Assessment and Plan  Patient Details  Name: [name] MRN: [id number] Date of Birth: 10-11-1955  OT Diagnosis: acute pain and muscle weakness (generalized) Rehab Potential: Rehab Potential (ACUTE ONLY): Good ELOS: 3 weeks   Today's Date: 05/01/2015 OT Individual Time:  - 8:00-9:00am       Problem List:  Patient Active Problem List   Diagnosis Date Noted  . Debility 05/01/2015  . Sepsis ([place]) 04/30/2015  . Acute renal failure with tubular necrosis ([place])   . Abscess of left hand   . Staphylococcus aureus bacteremia with sepsis ([place])   . Colles' fracture of left radius   . Cellulitis of left upper extremity   . Diastolic dysfunction   . Radial fracture   . Abscess   . Bacteremia due to Staphylococcus   . Pulmonary hypertension ([place])   . Acute blood loss anemia   . 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 DVT, lower extremity ([place])   . Radius distal fracture   . Cellulitis of finger of left hand   . Acute pyelonephritis   . Arm pain, anterior   . Cellulitis   . Bacteremia due to Staphylococcus aureus   . Acute renal failure syndrome ([place])   . Fracture of left radius   . ARF (acute renal failure) ([place]) 04/07/2015  . Severe sepsis ([place]) 04/07/2015  . Cellulitis of left hand 04/07/2015  . Elevated INR 04/07/2015  . Foot swelling 04/07/2015  . Elevated bilirubin 04/07/2015  . Hypokalemia 04/07/2015  . Hypomagnesemia 04/07/2015  . 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 Bacteremia 04/07/2015  . UTI (lower urinary tract infection)   . Chest pain 11/15/2013  . History of duodenal ulcer 11/15/2013  . 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 Septic shock due to abscess 07/13/2013  . Chronic use of opiate drugs therapeutic purposes 07/12/2013  . Abscess of left thigh 07/11/2013  . Acute renal failure ([place]) 07/11/2013  . Chronic pain syndrome 07/11/2013  . Testosterone deficiency 07/11/2013    Past Medical History:  Past Medical History  Diagnosis  Date  . Hypertension   . Chronic knee pain   . Hyperlipemia   . PUD (peptic ulcer disease)    Past Surgical History:  Past Surgical History  Procedure Laterality Date  . I&d extremity Left 07/12/2013    Procedure: I&D Left Thigh Abscess;  Surgeon: Wylene Simmer, MD;  Location: [facility];  Service: Orthopedics;  Laterality: Left;  . Replacement total knee bilateral    . Tonsillectomy    . Hernia repair    . Cholecystectomy    . Appendectomy    . Shoulder surgery    . Elbow surgery    . Femoral artery exploration Right 04/11/2015    Procedure: Resection of infected right femoral artery aneurysm;  Surgeon: Conrad [place], MD;  Location: [facility];  Service: Vascular;  Laterality: Right;  . Incision and drainage abscess Right 04/11/2015    Procedure: INCISION AND DRAINAGE OF RIGHT THIGH ABSCESS;  Surgeon: Conrad [place], MD;  Location: [place] [facility];  Service: Vascular;  Laterality: Right;  . Incision and drainage abscess Right 04/13/2015    Procedure: INCISION AND DRAINAGE THIGH ABSCESS;  Surgeon: Conrad [place], MD;  Location: [facility];  Service: Vascular;  Laterality: Right;  . I&d extremity Left 04/17/2015    Procedure: IRRIGATION AND DEBRIDEMENT LEFT EXTREMITY;  Surgeon: Conrad Catherine, MD;  Location: [facility];  Service: Vascular;  Laterality: Left;  Marland Kitchen Muscle flap  closure Right 04/19/2015    Procedure: RIGHT RECTUS ABDOMINUS  FLAP TO RIGHT GROIN;  Surgeon: Irene Limbo, MD;  Location: [place] [facility];  Service: Plastics;  Laterality: Right;  . Femoral revision Right 04/19/2015    Procedure: OVERSEW RIGHT FEMORAL VEIN ;  Surgeon: Angelia Mould, MD;  Location: [facility];  Service: Vascular;  Laterality: Right;  . Wound exploration Right 04/23/2015    Procedure: EXAM UNDER ANESTHESIA AND VAC CHANGE RIGHT THIGH;  Surgeon: Irene Limbo, MD;  Location: [facility];  Service: Plastics;  Laterality: Right;    Assessment & Plan Clinical Impression: Patient is a 59 y.o. year old male hypertension, chronic knee pain, peptic ulcer  disease, MRSA abscesses, peripheral neuropathy. Independent with occasional cane and walker prior to admission living with wife, in a home with facilities on the first floor and 4 steps to enter. Presented 04/07/2015 with recent fall 04/03/2015 with increasing left hand and wrist pain. In the ED noted to be hypotensive tachycardia and white blood cell count of 31,300. Placed on broad-spectrum anti-biotic's. MRI of left hand show dorsal distal radius transverse fracture, diffuse edema of the hand likely reactive to distal radius fracture, tear of the dorsal band of the scaphounate ligament. Multiple small deep fluid collections in the hands compatible with hematoma question superimposed infection. CT of right femur due to pain showed a 7.4 cm process involving or adjacent to the proximal right SFA question aneurysm, pseudoaneurysm or hematoma. 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 Bridgett Larsson. Follow-up infectious disease blood cultures positive bacillus/MSSA and remains on broad-spectrum antibiotics as advised. Patient returned to the OR 04/13/2015 for a repeat incision and drainage of thigh abscess negative pressure dressing placement as well as washout of right thigh abscess cavity 04/15/2015 and 04/17/2015 with rectus flap procedure 04/19/2015 per plastic surgery Dr Iran Planas and wound VAC change 04/23/2015. . TTE did not show valvular insufficiency or abscess. Hospital course tachycardia/SVT (requiring adenosine) with cardiology services consulted Dr. Einar Gip. Echocardiogram with ejection fraction of 63% grade 1 diastolic dysfunction. In regards to abscess of left dorsal hand underwent incision and drainage 04/23/2015 per Dr. Lenon Curt. Vascular study left upper extremity shows no DVT. Patient developed gangrenous thromboembolic injury to the right foot and toes with consult Dr. Sharol Given. No plan for  invasive procedures at this time with wound care nurse follow-up for skin care. Patient placed on Coumadin for thromboembolism as per Dr. Sharol Given. Patient transferred to CIR on 04/30/2015 .    Patient currently requires max A - total A +2 with basic self-care skills and basic mobility with max A with +2 for safety secondary to muscle weakness and acute pain, decreased cardiorespiratoy endurance and decreased sitting balance, decreased standing balance, decreased balance strategies and difficulty maintaining precautions.  Prior to hospitalization, patient could complete ADL with modified independent .  Patient will benefit from skilled intervention to decrease level of assist with basic self-care skills and increase independence with basic self-care skills prior to discharge home with care partner.  Anticipate patient will require 24 hour supervision and minimal physical assistance and follow up home health.  OT - End of Session Activity Tolerance: Tolerates < 10 min activity, no significant change in vital signs Endurance Deficit: Yes Endurance Deficit Description: cardiorespiratory OT Assessment Rehab Potential (ACUTE ONLY): Good OT Patient demonstrates impairments in the following area(s): Balance;Behavior;Edema;Endurance;Motor;Nutrition;Pain;Safety;Skin Integrity OT Basic ADL's Functional Problem(s): Grooming;Bathing;Dressing;Toileting OT Transfers Functional Problem(s): Toilet;Tub/Shower OT Additional  Impairment(s): Fuctional Use of Upper Extremity OT Plan OT Intensity: Minimum of 1-2 x/day, 45 to 90 minutes OT Frequency: 5 out of 7 days OT Duration/Estimated Length of Stay: 3 weeks OT Treatment/Interventions: Balance/vestibular training;Cognitive remediation/compensation;Discharge planning;Community reintegration;DME/adaptive equipment instruction;Functional mobility training;Pain management;Psychosocial support;Skin care/wound managment;Self Care/advanced ADL retraining;Patient/family  education;Therapeutic Activities;UE/LE Strength taining/ROM;UE/LE Coordination activities;Therapeutic Exercise;Wheelchair propulsion/positioning;Disease mangement/prevention OT Self Feeding Anticipated Outcome(s): n/a OT Basic Self-Care Anticipated Outcome(s): supervision OT Toileting Anticipated Outcome(s): supervision OT Bathroom Transfers Anticipated Outcome(s): supervision OT Recommendation Patient destination: Home Follow Up Recommendations: Outpatient OT Equipment Recommended: To be determined   Skilled Therapeutic Intervention 1:1 OT eval initiated with OT goals, purpose and role discussed. Self care retraining at EOB with focus on dynamic sitting balance, sitting tolerance, management/ positioning of right LE, lateral leans for clothing management, bed mobility, forward flexion for LB dressing, sit to stands attempts, and slide board transfer. Attempted to perform sit to stand with platform RW from elevated bed but unable to achieve fully upright stance- even after 4 attempts. Pt with difficulty keeping right Le underneath him and taking any weight through that LE. Slide board transfer bed to w/c with mod A with +2 for safety.      OT Evaluation Precautions/Restrictions  Precautions Precautions: Fall Precaution Comments: no specific weight bearing order but avoided weight bearing through L wrist; wound to right LE and groin Restrictions Weight Bearing Restrictions: Yes General Chart Reviewed: Yes Family/Caregiver Present: No Vital Signs Therapy Vitals Temp: (!) 100.5 F (38.1 C) Temp Source: Oral Pulse Rate: 99 Resp: 18 BP: (!) 148/74 mmHg Patient Position (if appropriate): Lying Oxygen Therapy SpO2: 96 % O2 Device: Not Delivered Pain  pain in right thigh and foot relief with rest Home Living/Prior Functioning Home Living Available Help at Discharge: Family, Available 24 hours/day Type of Home: House Home Access: Stairs to enter Technical brewer of Steps:  4 Entrance Stairs-Rails: Right, Left Home Layout: Two level, 1/2 bath on main level Alternate Level Stairs-Number of Steps: flight Alternate Level Stairs-Rails: Can reach both Bathroom Shower/Tub: Multimedia programmer: Associate Professor Accessibility: Yes  Lives With: Spouse (2 sons and a grandson) Prior Function Level of Independence: Requires assistive device for independence  Able to Take Stairs?: Yes Driving: No Vocation: Other (comment) Vocation Requirements: awaiting disability Comments: used to love playing golf & basketball; likes to watch sports on tv ADL ADL ADL Comments: see Care tool function Vision/Perception  Vision- History Baseline Vision/History: Wears glasses Wears Glasses: At all times Patient Visual Report: No change from baseline Vision- Assessment Vision Assessment?: No apparent visual deficits  Cognition Overall Cognitive Status: Within Functional Limits for tasks assessed Arousal/Alertness: Awake/alert Orientation Level: Person;Place;Situation Person: Oriented Place: Oriented Situation: Oriented Year: 2016 Month: September Day of Week: Incorrect (said it was a Saturday) Memory: Impaired Immediate Memory Recall: Sock;Blue;Bed Memory Recall: Sock;Bed (didnt get blue) Memory Recall Sock: Without Cue Memory Recall Bed: Without Cue Attention: Focused;Sustained Focused Attention: Appears intact Sustained Attention: Impaired Sustained Attention Impairment: Functional complex Awareness: Appears intact Problem Solving: Impaired Problem Solving Impairment: Functional complex Safety/Judgment: Appears intact Sensation Sensation Light Touch: Impaired Detail Light Touch Impaired Details: Impaired RLE Hot/Cold: Appears Intact Proprioception: Impaired Detail Proprioception Impaired Details: Impaired RLE Additional Comments: tingly R LE - likely from swelling throughout leg Coordination Gross Motor Movements are Fluid and Coordinated:  No Fine Motor Movements are Fluid and Coordinated: No Coordination and Movement Description: L UE and R LE pain limited use/coordination;  Motor   generalized weakness Mobility  Transfers Transfers:  (  unable to achieve fully upright position)  Trunk/Postural Assessment  Cervical Assessment Cervical Assessment: Within Functional Limits Thoracic Assessment Thoracic Assessment: Within Functional Limits Lumbar Assessment Lumbar Assessment: Within Functional Limits  Balance Static Sitting Balance Static Sitting - Balance Support: Feet supported Static Sitting - Level of Assistance: 5: Stand by assistance Dynamic Sitting Balance Dynamic Sitting - Balance Support: Bilateral upper extremity supported;Feet supported Dynamic Sitting - Level of Assistance: 3: Mod assist;4: Min assist Dynamic Sitting - Balance Activities: Forward lean/weight shifting;Lateral lean/weight shifting Extremity/Trunk Assessment RUE Assessment RUE Assessment: Within Functional Limits LUE Assessment LUE Assessment: Exceptions to WFL LUE AROM (degrees) Overall AROM Left Upper Extremity: Deficits LUE Strength LUE Overall Strength: Deficits;Due to pain LUE Overall Strength Comments: shoulder and elbow grossly >= 3/5; finger extension and grip 2+/5; no formal MMT due to pain and L wrist splint   See Function Navigator for Current Functional Status.   Refer to Care Plan for Long Term Goals  Recommendations for other services: Neuropsych  Discharge Criteria: Patient will be discharged from OT if patient refuses treatment 3 consecutive times without medical reason, if treatment goals not met, if there is a change in medical status, if patient makes no progress towards goals or if patient is discharged from hospital.  The above assessment, treatment plan, treatment alternatives and goals were discussed and mutually agreed upon: by patient  Nicoletta Ba 05/01/2015, 3:17 PM

## 2015-05-01 NOTE — Progress Notes (Signed)
ANTICOAGULATION CONSULT NOTE - Follow Up Consult  Pharmacy Consult for couamdin Indication: DVT  Allergies  Allergen Reactions  . Imitrex [Sumatriptan] Other (See Comments)    Chest pain, 2004 (tolerates 50 mg prn)  . Verapamil Other (See Comments)    Causes pvc's    Patient Measurements:   Heparin Dosing Weight:   Vital Signs: Temp: 98.9 F (37.2 C) (10/05 0557) Temp Source: Oral (10/05 0557) BP: 101/69 mmHg (10/05 0557) Pulse Rate: 92 (10/05 0557)  Labs:  Recent Labs  04/29/15 0642 04/30/15 0520 05/01/15 0535  HGB 8.1* 8.2* 7.9*  HCT 25.7* 26.1* 24.7*  PLT 382 364 333  LABPROT 22.4* 23.4* 23.4*  INR 1.98* 2.10* 2.10*  CREATININE 0.81 0.92 0.91    CrCl cannot be calculated (Unknown ideal weight.).   Medications:  Scheduled:  .  ceFAZolin (ANCEF) IV  2 g Intravenous 3 times per day  . docusate sodium  100 mg Oral BID  . feeding supplement (ENSURE ENLIVE)  237 mL Oral TID BM  . metoprolol tartrate  75 mg Oral BID  . morphine  90 mg Oral 2 times per day  . naloxegol oxalate  25 mg Oral Daily  . venlafaxine XR  75 mg Oral Q breakfast  . Warfarin - Pharmacist Dosing Inpatient   Does not apply q1800   Infusions:    Assessment: 59 yo male with DVT is currently on therapeutic coumadin.  INR today is 2.1 Goal of Therapy:  INR 2-3 Monitor platelets by anticoagulation protocol: Yes   Plan:  Give coumadin 7.5mg  PO x 1 today (may need 7.5 mg few days and 5 mg the rest) Monitor daily INR, CBC, s/s of bleed  Laurissa Cowper, Tsz-Yin 05/01/2015,8:48 AM

## 2015-05-01 NOTE — Plan of Care (Signed)
Problem: RH PAIN MANAGEMENT Goal: RH STG PAIN MANAGED AT OR BELOW PT'S PAIN GOAL <4  Outcome: Not Progressing States pain is a 8.5 or higher throughout my shift.

## 2015-05-01 NOTE — Evaluation (Signed)
Physical Therapy Assessment and Plan  Patient Details  Name: [name] MRN: [id number] Date of Birth: 12/12/1955  PT Diagnosis: Abnormal posture, Coordination disorder, Difficulty walking, Edema, Impaired cognition, Impaired sensation, Muscle weakness and Pain in R leg and L wrist Rehab Potential: Fair ELOS: 3 weeks   Today's Date: 05/01/2015 PT Individual Time: 0930-1030 Treatment Session 2: 1500-1600 PT Individual Time Calculation (min): 60 min  Treatment Session 2: 60 min  Problem List:  Patient Active Problem List   Diagnosis Date Noted  . Debility 05/01/2015  . Sepsis (HCC) 04/30/2015  . Acute renal failure with tubular necrosis (HCC)   . Abscess of left hand   . Staphylococcus aureus bacteremia with sepsis (HCC)   . Colles' fracture of left radius   . Cellulitis of left upper extremity   . Diastolic dysfunction   . Radial fracture   . Abscess   . Bacteremia due to Staphylococcus   . Pulmonary hypertension (HCC)   . Acute blood loss anemia   . Aneurysm of right femoral artery (HCC)   . DVT, lower extremity (HCC)   . Radius distal fracture   . Cellulitis of finger of left hand   . Acute pyelonephritis   . Arm pain, anterior   . Cellulitis   . Bacteremia due to Staphylococcus aureus   . Acute renal failure syndrome (HCC)   . Fracture of left radius   . ARF (acute renal failure) (HCC) 04/07/2015  . Severe sepsis (HCC) 04/07/2015  . Cellulitis of left hand 04/07/2015  . Elevated INR 04/07/2015  . Foot swelling 04/07/2015  . Elevated bilirubin 04/07/2015  . Hypokalemia 04/07/2015  . Hypomagnesemia 04/07/2015  . 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 Bacteremia 04/07/2015  . UTI (lower urinary tract infection)   . Chest pain 11/15/2013  . History of duodenal ulcer 11/15/2013  . 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 Septic shock due to abscess 07/13/2013  . Chronic use of opiate drugs therapeutic purposes 07/12/2013  . Abscess of left thigh 07/11/2013  . Acute  renal failure (HCC) 07/11/2013  . Chronic pain syndrome 07/11/2013  . Testosterone deficiency 07/11/2013    Past Medical History:  Past Medical History  Diagnosis Date  . Hypertension   . Chronic knee pain   . Hyperlipemia   . PUD (peptic ulcer disease)    Past Surgical History:  Past Surgical History  Procedure Laterality Date  . I&d extremity Left 07/12/2013    Procedure: I&D Left Thigh Abscess;  Surgeon: John [name], MD;  Location: [facility] OR;  Service: Orthopedics;  Laterality: Left;  . Replacement total knee bilateral    . Tonsillectomy    . Hernia repair    . Cholecystectomy    . Appendectomy    . Shoulder surgery    . Elbow surgery    . 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 Incision and drainage abscess Right 04/11/2015    Procedure: INCISION AND DRAINAGE OF RIGHT THIGH ABSCESS;  Surgeon: Brian L Chen, MD;  Location: [facility] OR;  Service: Vascular;  Laterality: Right;  . Incision and drainage abscess Right 04/13/2015    Procedure: INCISION AND DRAINAGE THIGH ABSCESS;  Surgeon: Brian L Chen, MD;  Location: [facility] OR;  Service: Vascular;  Laterality: Right;  . I&d extremity Left 04/17/2015    Procedure: IRRIGATION AND DEBRIDEMENT LEFT   EXTREMITY;  Surgeon: Conrad [place], MD;  Location: [place];  Service: Vascular;  Laterality: Left;  Marland Kitchen Muscle flap closure Right 04/19/2015    Procedure: RIGHT RECTUS ABDOMINUS  FLAP TO RIGHT GROIN;  Surgeon: Irene Limbo, MD;  Location: [place];  Service: Plastics;  Laterality: Right;  . Femoral revision Right 04/19/2015    Procedure: OVERSEW RIGHT FEMORAL VEIN ;  Surgeon: Angelia Mould, MD;  Location: [place];  Service: Vascular;  Laterality: Right;  . Wound exploration Right 04/23/2015    Procedure: EXAM UNDER ANESTHESIA AND VAC CHANGE RIGHT THIGH;  Surgeon: Irene Limbo, MD;  Location: [place];   Service: Plastics;  Laterality: Right;    Assessment & Plan Clinical Impression: [name] is a 59 y.o. right handed male history of hypertension, chronic knee pain, peptic ulcer disease, MRSA abscesses, peripheral neuropathy. Independent with occasional cane and walker prior to admission living with wife, in a home with facilities on the first floor and 4 steps to enter. Presented 04/07/2015 with recent fall 04/03/2015 with increasing left hand and wrist pain. In the ED noted to be hypotensive tachycardia and white blood cell count of 31,300. Placed on broad-spectrum anti-biotic's. MRI of left hand show dorsal distal radius transverse fracture, diffuse edema of the hand likely reactive to distal radius fracture, tear of the dorsal band of the scaphounate ligament. Multiple small deep fluid collections in the hands compatible with hematoma question superimposed infection. CT of right femur due to pain showed a 7.4 cm process involving or adjacent to the proximal right SFA question aneurysm, pseudoaneurysm or hematoma. 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 Bridgett Larsson. Follow-up infectious disease blood cultures positive bacillus/MSSA and remains on broad-spectrum antibiotics as advised. Patient returned to the OR 04/13/2015 for a repeat incision and drainage of thigh abscess negative pressure dressing placement as well as washout of right thigh abscess cavity 04/15/2015 and 04/17/2015 with rectus flap procedure 04/19/2015 per plastic surgery Dr Iran Planas and wound VAC change 04/23/2015. . TTE did not show valvular insufficiency or abscess. Hospital course tachycardia/SVT (requiring adenosine) with cardiology services consulted Dr. Einar Gip. Echocardiogram with ejection fraction of 71% grade 1 diastolic dysfunction. In regards to abscess of left dorsal hand underwent incision and drainage  04/23/2015 per Dr. Lenon Curt. Vascular study left upper extremity shows no DVT. Patient developed gangrenous thromboembolic injury to the right foot and toes with consult Dr. Sharol Given. No plan for invasive procedures at this time with wound care nurse follow-up for skin care. Patient placed on Coumadin for thromboembolism as per Dr. Sharol Given. Physical and occupational therapy evaluations completed 04/24/2015 with recommendations of physical medicine rehabilitation consult.  Patient transferred to CIR on 04/30/2015 .   Patient currently requires total with mobility secondary to muscle weakness and muscle joint tightness, decreased cardiorespiratoy endurance, decreased coordination and decreased sitting balance, decreased standing balance and decreased postural control.  Prior to hospitalization, patient was modified independent  with mobility and lived with   in a House home.  Home access is 4Stairs to enter.  Patient will benefit from skilled PT intervention to maximize safe functional mobility, minimize fall risk and decrease caregiver burden for planned discharge home with 24 hour assist.  Anticipate patient will benefit from follow up [facility] at discharge.  PT - End of Session Activity Tolerance: Tolerates < 10 min activity with changes in vital signs Endurance Deficit: Yes Endurance Deficit Description: cardiorespiratory PT Assessment  Rehab Potential (ACUTE/IP ONLY): Fair Barriers to Discharge: Inaccessible home environment PT Patient demonstrates impairments in the following area(s): Balance;Skin Integrity;Edema;Endurance;Motor;Pain;Safety;Sensory PT Transfers Functional Problem(s): Bed Mobility;Bed to Chair;Car;Furniture PT Locomotion Functional Problem(s): Ambulation;Wheelchair Mobility;Stairs PT Plan PT Intensity: Minimum of 1-2 x/day ,45 to 90 minutes PT Frequency: 5 out of 7 days PT Duration Estimated Length of Stay: 3 weeks PT Treatment/Interventions: Ambulation/gait training;Discharge  planning;Functional mobility training;Psychosocial support;Therapeutic Activities;Balance/vestibular training;Disease management/prevention;Neuromuscular re-education;Skin care/wound management;Therapeutic Exercise;Wheelchair propulsion/positioning;Cognitive remediation/compensation;DME/adaptive equipment instruction;Pain management;Splinting/orthotics;UE/LE Strength taining/ROM;Community reintegration;Patient/family education;Stair training;UE/LE Coordination activities PT Transfers Anticipated Outcome(s): supervision PT Locomotion Anticipated Outcome(s): supervision gait and w/c mobility; min A stairs PT Recommendation Recommendations for Other Services: Neuropsych consult Follow Up Recommendations: 24 hour supervision/assistance;Home health PT Patient destination: Home Equipment Recommended: To be determined Equipment Details: Pt already owns RW and [facility]  Skilled Therapeutic Intervention Treatment Session 1: Pt received up in w/c - c/o high levels of pain, but unable to receive additional pain medication until after PT evaluation. PT Evaluation: Pt presents with significant decline in functional mobility, primarily due to edema and increased pain in L wrist and entire R leg. Pt educated about muscle pumping action and elevating extremities above heart level to assist in edema reduction, which should translate to less pain. Pt has impaired R hip/knee/ankle ROM due to edema in leg and L wrist ROM/strength not tested due to splint and precaution. Pt unable to stand with L PFRW on eval visit due to RW being too narrow. Pt requesting wider walker and sit to stand planned to be trialed second PT session, today. Therapeutic Activity. Pt req min A x 1 and mod A x 1 for scoot transfer w/c to bed with slideboard - verbal cues for head hips relationship with poor compliance by pt. Pt req mod A for sit to lying down in flat bed without rail (assist at trunk and for R leg). Therapeutic Exercise: PT instructs pt in  L arm flexion overhead activity x 10 reps, R LAQ/seated knee flexion within available rom/strength x 10 reps, supine hip abduction/adduction x 10 reps, and heel slides x 10 reps. Pt ended in bed, comfortable, with all needs in reach.   Treatment Session 2: Pt received resting in bed with IV and foley catheter attached - agreeable to PT. Therapeutic Exercise: PT instructs pt in ankle rom exercises: PF/DF and ankle circles clockwise & counter clockwise x 20 reps each. Therapeutic Activity - PT instructs pt in sit to stand from eob with L forearm on lower bedrail and R arm pushing up on upper bedrail req mod-max A. PT progresses this activity to include transitioning to L PFRW once in stand and req mod-max A x 2 stand-pivot transfer wiggling support foot with heavy reliance on L LE into w/c. At end of session, PT instructs pt in scoot transfer with slideboard w/c to bed to pt's R req mod A and tactile cues for head-hips relationship, then mod A sit to supine transfer. W/C Management - PT instructs pt in w/c propulsion with R UE and L LE x 25' req min A and PT management of IV pole - pt fatigued after this distance. Gait Training: PT instructs pt in ambulation with L PFRW x 1' (2 hops) req mod A x 2 for safety - pt reports legs feel as if they are going to give out, so pt assisted to sit in w/c. At end of session, pt resting comfortably in bed with all needs in reach. Continue per PT POC.  PT Evaluation Precautions/Restrictions Precautions Precautions: Fall Precaution Comments: no specific weight bearing order but avoided weight bearing through L wrist Restrictions Weight Bearing Restrictions: Yes Other Position/Activity Restrictions: see above General Chart Reviewed: Yes Family/Caregiver Present: Yes ([name], wife present)  Pain Pain Assessment Pain Assessment: 0-10 Pain Score: 9  Pain Type: Chronic pain Pain Location: Hip Pain Orientation: Right Pain Descriptors / Indicators: Sharp Pain  Onset: On-going Pain Intervention(s): Emotional support;Distraction;Rest;Medication (See eMAR) Multiple Pain Sites: Yes 2nd Pain Site Pain Score: 8 Pain Type: Acute pain Pain Location: Wrist Pain Orientation: Left Pain Descriptors / Indicators: Sharp Pain Onset: On-going Pain Intervention(s): Medication (See eMAR);Rest;Emotional support;Distraction  Treatment Session 2: Pt c/o 6/10 pain in L wrist at rest; PT uses emotional support, rest, and distraction to decrease pt's pain.   Home Living/Prior Functioning Home Living Available Help at Discharge: Family;Available 24 hours/day Type of Home: House Home Access: Stairs to enter Entrance Stairs-Number of Steps: 4 Entrance Stairs-Rails: Right;Left Home Layout: Two level;1/2 bath on main level Alternate Level Stairs-Number of Steps: flight Alternate Level Stairs-Rails: Can reach both Bathroom Shower/Tub: Walk-in shower (upstairs) Bathroom Toilet: Standard Bathroom Accessibility: Yes Prior Function Level of Independence: Requires assistive device for independence  Able to Take Stairs?: Yes Driving: No Vocation: Other (comment) Vocation Requirements: awaiting disability Comments: used to love playing golf & basketball; likes to watch sports on tv Vision/Perception  Perception Comments: wfl  Cognition Overall Cognitive Status: Within Functional Limits for tasks assessed Arousal/Alertness: Awake/alert Orientation Level: Oriented to person;Disoriented to time;Oriented to place;Oriented to situation Attention: Focused;Sustained Focused Attention: Appears intact Sustained Attention: Impaired Sustained Attention Impairment: Verbal complex (likely due to pain) Memory: Appears intact Problem Solving: Appears intact Sensation Sensation Light Touch: Impaired Detail Light Touch Impaired Details: Impaired RLE Stereognosis: Not tested Hot/Cold: Not tested Proprioception: Appears Intact Additional Comments: tingly R LE - likely from  swelling throughout leg Coordination Gross Motor Movements are Fluid and Coordinated: No Fine Motor Movements are Fluid and Coordinated: No Coordination and Movement Description: L UE and R LE pain limited use/coordination;  Finger Nose Finger Test: increased intention tremor L UE> R UE, likely due to pain (pt reports this not present prior to accident) Heel Shin Test: wfl L LE; unable to complete on R LE due to pain Motor  Motor Motor: Abnormal postural alignment and control Motor - Skilled Clinical Observations: pt retropulsive in sit, likely due to pain  Mobility Bed Mobility Bed Mobility: Sit to Supine;Scooting to HOB Sit to Supine: 3: Mod assist Sit to Supine - Details: Manual facilitation for weight shifting;Manual facilitation for placement;Verbal cues for sequencing;Verbal cues for technique;Verbal cues for precautions/safety Scooting to HOB: 4: Min assist Scooting to HOB Details: Manual facilitation for weight shifting;Manual facilitation for placement;Verbal cues for technique;Verbal cues for sequencing;Verbal cues for precautions/safety Scooting to HOB Details (indicate cue type and reason): PT stabilized L foot while pt scooting with L leg and elbows dug into mattress Transfers Transfers: Yes Lateral/Scoot Transfers: 1: +2 Total assist;With slide board Lateral/Scoot Transfer Details: Manual facilitation for placement;Manual facilitation for weight shifting;Verbal cues for technique;Verbal cues for sequencing;Verbal cues for precautions/safety Lateral/Scoot Transfer Details (indicate cue type and reason): lean forward and head-hips relationship Locomotion (PM session) Ambulation Ambulation: Yes Ambulation/Gait Assistance: 1: +2 Total assist Ambulation Distance (Feet): 1 Feet Assistive device: Left platform walker Ambulation/Gait Assistance Details: Manual facilitation for placement;Manual facilitation for weight shifting;Verbal cues for safe use of DME/AE;Verbal cues for  precautions/safety;Verbal cues for technique;Verbal cues for sequencing;Verbal cues for gait pattern Ambulation/Gait   Assistance Details: crouched posture with heavy reliance on L PFRW; minimal weightbearing through R LE due to pain Gait Gait: Yes Gait Pattern: Impaired Gait Pattern: Step-to pattern;Decreased step length - left;Decreased stance time - right;Decreased weight shift to right;Shuffle;Trunk flexed;Wide base of support Gait velocity: slow Stairs / Additional Locomotion Stairs: No Wheelchair Mobility Wheelchair Mobility: Yes Wheelchair Assistance: 4: Min assist Wheelchair Assistance Details: Manual facilitation for placement;Verbal cues for technique;Verbal cues for precautions/safety Wheelchair Propulsion: Right upper extremity;Left lower extremity Wheelchair Parts Management: Needs assistance Distance: 25'  Trunk/Postural Assessment  Cervical Assessment Cervical Assessment: Within Functional Limits Thoracic Assessment Thoracic Assessment: Within Functional Limits Lumbar Assessment Lumbar Assessment: Within Functional Limits Postural Control Postural Control: Deficits on evaluation Trunk Control: retropulsive trunk during dyanmic sit balance activity Postural Limitations: generalized retropulosive sitting posture with sacral sitting, likely due to pain  Balance Balance Balance Assessed: Yes Static Sitting Balance Static Sitting - Balance Support: Bilateral upper extremity supported;Feet supported Static Sitting - Level of Assistance: 4: Min assist Dynamic Sitting Balance Dynamic Sitting - Balance Support: Bilateral upper extremity supported;Feet supported Dynamic Sitting - Level of Assistance: 3: Mod assist Dynamic Sitting - Balance Activities: Forward lean/weight shifting;Lateral lean/weight shifting Extremity Assessment  RUE Assessment RUE Assessment: Within Functional Limits LUE Assessment LUE Assessment: Exceptions to WFL LUE AROM (degrees) Overall AROM Left  Upper Extremity: Deficits LUE Overall AROM Comments: shoulder and elbow wfl; finger extension limited 25%; grip limited 50%; wrist NT due to precaution/splint in place LUE Strength LUE Overall Strength: Deficits;Due to pain LUE Overall Strength Comments: shoulder and elbow grossly >= 3/5; finger extension and grip 2+/5; no formal MMT due to pain and L wrist splint RLE Assessment RLE Assessment: Exceptions to WFL RLE AROM (degrees) Overall AROM Right Lower Extremity: Deficits;Due to decreased strength;Due to pain RLE Overall AROM Comments: hip flexion limited 75%; knee rom limited 50%; ankle rom limited 50% RLE Strength RLE Overall Strength: Deficits;Due to pain RLE Overall Strength Comments: grossly 2+/5 throughout LLE Assessment LLE Assessment: Within Functional Limits (but slowed movement due to pain)   See Function Navigator for Current Functional Status.   Refer to Care Plan for Long Term Goals  Recommendations for other services: Neuropsych  Discharge Criteria: Patient will be discharged from PT if patient refuses treatment 3 consecutive times without medical reason, if treatment goals not met, if there is a change in medical status, if patient makes no progress towards goals or if patient is discharged from hospital.  The above assessment, treatment plan, treatment alternatives and goals were discussed and mutually agreed upon: by patient and by family  HYSLOP,AMANDA M 05/01/2015, 12:31 PM  

## 2015-05-02 ENCOUNTER — Inpatient Hospital Stay (HOSPITAL_COMMUNITY): Payer: Medicaid Other

## 2015-05-02 ENCOUNTER — Inpatient Hospital Stay (HOSPITAL_COMMUNITY): Payer: 59

## 2015-05-02 ENCOUNTER — Inpatient Hospital Stay (HOSPITAL_COMMUNITY): Payer: Medicaid Other | Admitting: Physical Therapy

## 2015-05-02 DIAGNOSIS — G5721 Lesion of femoral nerve, right lower limb: Secondary | ICD-10-CM

## 2015-05-02 LAB — PROTIME-INR
INR: 2.3 — AB (ref 0.00–1.49)
PROTHROMBIN TIME: 25.1 s — AB (ref 11.6–15.2)

## 2015-05-02 MED ORDER — CLONAZEPAM 0.5 MG PO TABS
0.5000 mg | ORAL_TABLET | Freq: Two times a day (BID) | ORAL | Status: DC | PRN
  Administered 2015-05-02 – 2015-05-07 (×10): 0.5 mg via ORAL
  Filled 2015-05-02 (×10): qty 1

## 2015-05-02 MED ORDER — CLONAZEPAM 0.5 MG PO TABS: 0.5000 mg | ORAL_TABLET | Freq: Two times a day (BID) | ORAL | Status: DC

## 2015-05-02 MED ORDER — BENEPROTEIN PO POWD
1.0000 | Freq: Three times a day (TID) | ORAL | Status: DC
  Administered 2015-05-02 – 2015-05-07 (×14): 6 g via ORAL
  Filled 2015-05-02 (×2): qty 227

## 2015-05-02 MED ORDER — WARFARIN SODIUM 5 MG PO TABS
5.0000 mg | ORAL_TABLET | Freq: Once | ORAL | Status: AC
Start: 2015-05-02 — End: 2015-05-02
  Administered 2015-05-02: 5 mg via ORAL
  Filled 2015-05-02: qty 1

## 2015-05-02 NOTE — Progress Notes (Signed)
Occupational Therapy Session Note  Patient Details  Name: [name] MRN: [id number] Date of Birth: [birth year]  Today's Date: 05/02/2015 OT Individual Time: 1300-1330 OT Individual Time Calculation (min): 30 min    Short Term Goals: Week 1:  OT Short Term Goal 1 (Week 1): Pt will transfer to [facility] with +1 max A  OT Short Term Goal 2 (Week 1): Pt will don shirt with setup OT Short Term Goal 3 (Week 1): Pt will thread LB clothing with mod A  OT Short Term Goal 4 (Week 1): Pt will perform bed mobility supine <>sit with min A with extra time OT Short Term Goal 5 (Week 1): Pt will perform sit to stand with max A in prep for clothing management   Skilled Therapeutic Interventions/Progress Updates: Therapeutic activity with focus on family education (wife [name]), re-ed on goals and methods of treatment.   Pt confirms plan to reside s/p d/c at lower level which has only a 1/2 bath available.  OT advised to focus treatment on transfers, activity tolerance and standing balance.   Spouse reports potential for assist from her sons on helping pt in/out of walk-in shower at home or use of tub/shower with tub bench (if provided).    Therapeutic exercises with focus on maximal strength through RUE as beneficial to maintaining standing balance.   Pt performed 2 progressive resistance exercises for RUE strengthening with manual setup assist to anchor thera-band, blue (level 4 resistance).   Pt completes 1 set of 10, RUE curls and RUE tricep extensions.   Pt retains competence with UE strengthening exercises from prior training.     Therapy Documentation Precautions:  Precautions Precautions: Fall Precaution Comments: no specific weight bearing order but avoided weight bearing through L wrist; wound to right LE and groin Restrictions Weight Bearing Restrictions: Yes Other Position/Activity Restrictions: see above  Vital Signs: Therapy Vitals Temp: 99.6 F (37.6 C) Temp Source: Oral Pulse Rate: 87 Resp:  17 BP: 135/67 mmHg Patient Position (if appropriate): Sitting Oxygen Therapy SpO2: 97 % O2 Device: Not Delivered   Pain: Pain Assessment Pain Assessment: 0-10 Pain Score: 8  Pain Type: Acute pain Pain Location: Leg Pain Orientation: Right Pain Descriptors / Indicators: Sharp Pain Onset: On-going Pain Intervention(s): Medication (See eMAR)  ADL: ADL ADL Comments: see Care tool function   Exercises: General Exercises - Upper Extremity Elbow Flexion: Strengthening;Right;Theraband;10 reps;Seated Theraband Level (Elbow Flexion): Level 4 (Blue) Elbow Extension: Strengthening;Right;10 reps;Theraband;Seated Theraband Level (Elbow Extension): Level 4 (Blue)  See Function Navigator for Current Functional Status.   Therapy/Group: Individual Therapy  [name] 05/02/2015, 2:12 PM

## 2015-05-02 NOTE — Progress Notes (Signed)
Social Work Patient ID: [name], male   DOB: [birth year], 59 y.o.   MRN: [id number]   CSW met with pt and his wife to update them on team conference discussion.  Pt recognizes that he needs some time to rehabilitate, so they were not upset about a long length of stay.  CSW explained that if pt progresses faster than expected, we can change the d/c date to reflect his progress.  Pt/wife expressed understanding and they were appreciative that pt has the opportunity to rehabilitate on CIR.  CSW explained that pt may not be able to get PT/OT after CIR, so that makes it even more important to take advantage of this time.  CSW will continue to follow and assist as needed.

## 2015-05-02 NOTE — Progress Notes (Signed)
Social Work Assessment and Plan  Patient Details  Name: [name] MRN: [id number] Date of Birth: 10/09/1955  Today's Date: 05/02/2015  Problem List:  Patient Active Problem List   Diagnosis Date Noted  . Debility 05/01/2015  . Sepsis ([place]) 04/30/2015  . Acute renal failure with tubular necrosis ([place])   . Abscess of left hand   . Staphylococcus aureus bacteremia with sepsis ([place])   . Colles' fracture of left radius   . Cellulitis of left upper extremity   . Diastolic dysfunction   . Radial fracture   . Abscess   . Bacteremia due to Staphylococcus   . Pulmonary hypertension ([place])   . Acute blood loss anemia   . 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 DVT, lower extremity ([place])   . Radius distal fracture   . Cellulitis of finger of left hand   . Acute pyelonephritis   . Arm pain, anterior   . Cellulitis   . Bacteremia due to Staphylococcus aureus   . Acute renal failure syndrome ([place])   . Fracture of left radius   . ARF (acute renal failure) ([place]) 04/07/2015  . Severe sepsis ([place]) 04/07/2015  . Cellulitis of left hand 04/07/2015  . Elevated INR 04/07/2015  . Foot swelling 04/07/2015  . Elevated bilirubin 04/07/2015  . Hypokalemia 04/07/2015  . Hypomagnesemia 04/07/2015  . Femoral artery aneurysm, right ([place]) 04/07/2015  . Bacteremia 04/07/2015  . UTI (lower urinary tract infection)   . Chest pain 11/15/2013  . History of duodenal ulcer 11/15/2013  . 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 Septic shock due to abscess 07/13/2013  . Chronic use of opiate drugs therapeutic purposes 07/12/2013  . Abscess of left thigh 07/11/2013  . Acute renal failure ([place]) 07/11/2013  . Chronic pain syndrome 07/11/2013  . Testosterone deficiency 07/11/2013   Past Medical History:  Past Medical History  Diagnosis Date  . Hypertension   . Chronic knee pain   . Hyperlipemia   . PUD (peptic ulcer disease)    Past Surgical History:  Past Surgical History  Procedure Laterality Date  . I&d  extremity Left 07/12/2013    Procedure: I&D Left Thigh Abscess;  Surgeon: Craig Simmer, MD;  Location: [facility];  Service: Orthopedics;  Laterality: Left;  . Replacement total knee bilateral    . Tonsillectomy    . Hernia repair    . Cholecystectomy    . Appendectomy    . Shoulder surgery    . Elbow surgery    . Femoral artery exploration Right 04/11/2015    Procedure: Resection of infected right femoral artery aneurysm;  Surgeon: Craig Atwood, MD;  Location: [facility];  Service: Vascular;  Laterality: Right;  . Incision and drainage abscess Right 04/11/2015    Procedure: INCISION AND DRAINAGE OF RIGHT THIGH ABSCESS;  Surgeon: Craig Florence, MD;  Location: [facility];  Service: Vascular;  Laterality: Right;  . Incision and drainage abscess Right 04/13/2015    Procedure: INCISION AND DRAINAGE THIGH ABSCESS;  Surgeon: Craig Hanover, MD;  Location: [facility];  Service: Vascular;  Laterality: Right;  . I&d extremity Left 04/17/2015    Procedure: IRRIGATION AND DEBRIDEMENT LEFT EXTREMITY;  Surgeon: Craig [place], MD;  Location: [facility];  Service: Vascular;  Laterality: Left;  Marland Kitchen Muscle flap closure Right 04/19/2015    Procedure: RIGHT RECTUS ABDOMINUS  FLAP TO RIGHT GROIN;  Surgeon: Craig Limbo, MD;  Location: [facility];  Service: Plastics;  Laterality: Right;  .  Femoral revision Right 04/19/2015    Procedure: OVERSEW RIGHT FEMORAL VEIN ;  Surgeon: Craig Mould, MD;  Location: [facility];  Service: Vascular;  Laterality: Right;  . Wound exploration Right 04/23/2015    Procedure: EXAM UNDER ANESTHESIA AND VAC CHANGE RIGHT THIGH;  Surgeon: Craig Limbo, MD;  Location: [facility];  Service: Plastics;  Laterality: Right;   Social History:  reports that he has quit smoking. His smoking use included Cigarettes. He does not have any smokeless tobacco history on file. He reports that he does not drink alcohol or use illicit drugs.  Family / Support Systems Marital Status: Married How Long?: 31 years Patient Roles: Spouse,  Parent, Other (Comment) (grandparent) Spouse/Significant Other: [name] - wife - [phone] Children: 3 adult children; 2 sons live with them and 1 dtr out of the home Other Supports: church family Anticipated Caregiver: wife with sons Ability/Limitations of Caregiver: wife can assist, but does have COPD.  feeling well currently, but can have difficult days. Caregiver Availability: 24/7 Family Dynamics: close, supportive family  Social History Preferred language: English Religion: Baptist Cultural Background: Member of [employer] Education: college - biology and business Read: Yes Write: Yes Employment Status: Unemployed Date Retired/Disabled/Unemployed: has not been able to work for over a year Public relations account executive Issues: Pt is appealing Visual merchandiser and has hired an Forensic psychologist to assist. Guardian/Conservator: N/A - pt is capable of making his own decisions per Dr. Letta Pate   Abuse/Neglect Physical Abuse: Denies Verbal Abuse: Denies Sexual Abuse: Denies Exploitation of patient/patient's resources: Denies Self-Neglect: Denies  Emotional Status Pt's affect, behavior and adjustment status: Pt was a little flat during assessment, but did smile a couple of times.  He expressed frustration with his current medical condition and was hard on himself for making a mistake. Recent Psychosocial Issues: Pt has not been able to work for over a year and his wife does not work.  Financial stressors are great for the family.  They try to stay positive. Psychiatric History: hx of depression and anxiety - on medication and sees a psychiatrist Substance Abuse History: none reported  Patient / Family Perceptions, Expectations & Goals Pt/Family understanding of illness & functional limitations: Pt/wife report feeling that their questions have been answered and that they have a good understanding of pt's condition. Premorbid pt/family roles/activities: Pt enjoys  spending time with his 71 y/o grandchild and their activities usually revolve around him. Anticipated changes in roles/activities/participation: Pt would like to resume activities with grandchild.  He feels he will never be able to return to work. Pt/family expectations/goals: Pt wants to "get back to his previous level of functioning" and to be more independent.  Community Resources [employer]: Other (Comment) (DSS) Premorbid Home Care/DME Agencies: Other (Comment) (Pt can't remember, but he and wife have had HH in the past.  Maybe [facility]?) Transportation available at discharge: wife/family Resource referrals recommended: Neuropsychology  Discharge Planning Living Arrangements: Spouse/significant other, Children Support Systems: Spouse/significant other, Children, Other relatives, Friends/neighbors, Church/faith community Type of Residence: Private residence Insurance Resources: Medicaid (specify county) Sports coach) Financial Screen Referred: Previously completed Money Management: Patient, Spouse Does the patient have any problems obtaining your medications?: No Home Management: Pt's sons take care of the outside of the home and wife takes care of the inside of the home, although sons help with that a lot, too, per wife. Patient/Family Preliminary Plans: Pt plans to return to his home with his wife and sons. Barriers to Discharge: Steps,  Finances Social Work Anticipated Follow Up Needs: HH/OP Expected length of stay: 3 weeks  Clinical Impression CSW met with pt and his wife to introduce self and role of CSW, as well as to complete assessment.  Pt and wife were friendly with CSW, but pt was a little flat but with good eye contact.  He reports a hx of anxiety and depression, but that medication and visits with the psychiatrist help.  Pt has good support from his wife and 3 adult children, with 2 sons living in the home who help their parents out a lot.  Pt/wife are grateful  pt could come to [facility] for rehab and hope that pt can regain his functional level and be more independent than he is now.  Pt will need DME and CSW will order this.  Pt will most likely not meet Medicaid criteria for f/u therapies, so therapy team can prepare him for this and send him home with an exercise program.  CSW will continue to follow pt and assist as needed.  Rickeya Manus, Silvestre Mesi 05/02/2015, 2:21 PM

## 2015-05-02 NOTE — Progress Notes (Signed)
Occupational Therapy Session Note  Patient Details  Name: [name] MRN: [id number] Date of Birth: Jul 07, 1956  Today's Date: 05/02/2015 OT Individual Time: 1000-1045 OT Individual Time Calculation (min): 45 min    Short Term Goals: Week 1:  OT Short Term Goal 1 (Week 1): Pt will transfer to [facility] with +1 max A  OT Short Term Goal 2 (Week 1): Pt will don shirt with setup OT Short Term Goal 3 (Week 1): Pt will thread LB clothing with mod A  OT Short Term Goal 4 (Week 1): Pt will perform bed mobility supine <>sit with min A with extra time OT Short Term Goal 5 (Week 1): Pt will perform sit to stand with max A in prep for clothing management   Skilled Therapeutic Interventions/Progress Updates:    Pt resting in w/c upon arrival.  Pt stated he had not yet received his breakfast tray and call made to nutritional services. Pt requested pain meds and RN notified.  Pt reminded that RN has posted time on white board when he could have his next pain meds.  Pt stated he didn't have any clothing but when hospital gown removed pt realized he did have on shorts.  Pt engaged in BADL retraining at w/c level.  Pt declined standing secondary to pain of 8/10 in BLE and hips.  Pt required increased time to complete tasks.  Focus on activity tolerance, BADL retraining, compensatory strategies, task initiation, and safety awareness. Pt's breakfast tray arrived near end of session and session terminated to allow pt time to eat breakfast.  Pt remained in w/c with all needs within reach.  Therapy Documentation Precautions:  Precautions Precautions: Fall Precaution Comments: no specific weight bearing order but avoided weight bearing through L wrist; wound to right LE and groin Restrictions Weight Bearing Restrictions: Yes Other Position/Activity Restrictions: see above General: General OT Amount of Missed Time: 15 Minutes   Pain: Pain Assessment Pain Assessment: 0-10 Pain Score: 8  Pain Type: Acute  pain Pain Location: Leg Pain Orientation: Right Pain Descriptors / Indicators: Sharp Pain Onset: On-going Pain Intervention(s):RN aware  Multiple Pain Sites: Yes 2nd Pain Site Pain Score: 7 Pain Type: Acute pain Pain Location: Wrist Pain Orientation: Left Pain Descriptors / Indicators: Sharp;Aching Pain Onset: On-going Pain Intervention(s): Rn aware;Rest; ADL: ADL ADL Comments: see Care tool function  See Function Navigator for Current Functional Status.   Therapy/Group: Individual Therapy  Rich Brave 05/02/2015, 12:10 PM

## 2015-05-02 NOTE — Progress Notes (Signed)
59 y.o. right handed male history of hypertension, chronic knee pain, peptic ulcer disease, MRSA abscesses, peripheral neuropathy. Independent with occasional cane and walker prior to admission living with wife, in a home with facilities on the first floor and 4 steps to enter. Presented 04/07/2015 with recent fall 04/03/2015 with increasing left hand and wrist pain. In the ED noted to be hypotensive tachycardia and white blood cell count of 31,300. Placed on broad-spectrum anti-biotic's. MRI of left hand show dorsal distal radius transverse fracture, diffuse edema of the hand likely reactive to distal radius fracture, tear of the dorsal band of the scaphounate ligament. Multiple small deep fluid collections in the hands compatible with hematoma question superimposed infection. CT of right femur due to pain showed a 7.4 cm process involving or adjacent to the proximal right SFA question aneurysm, pseudoaneurysm or hematoma. 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 Bridgett Larsson. . In regards to abscess of left dorsal hand underwent incision and drainage 04/23/2015 per Dr. Lenon Curt  Subjective/Complaints: Good day in therapy yesterday  No SOB or CP  Knee pain is chronic and is the most troublesome pain today.  No abd pain or R foot or L wrist pain noted  Objective: Vital Signs: Blood pressure 131/83, pulse 88, temperature 99.5 F (37.5 C), temperature source Oral, resp. rate 18, weight 122.925 kg (271 lb), SpO2 96 %. No results found. Results for orders placed or performed during the hospital encounter of 04/30/15 (from the past 72 hour(s))  CBC WITH DIFFERENTIAL     Status: Abnormal   Collection Time: 05/01/15  5:35 AM  Result Value Ref Range   WBC 8.9 4.0 - 10.5 K/uL   RBC 2.85 (L) 4.22 - 5.81 MIL/uL   Hemoglobin 7.9 (L) 13.0 - 17.0 g/dL   HCT 24.7 (L) 39.0 - 52.0 %   MCV 86.7 78.0  - 100.0 fL   MCH 27.7 26.0 - 34.0 pg   MCHC 32.0 30.0 - 36.0 g/dL   RDW 15.6 (H) 11.5 - 15.5 %   Platelets 333 150 - 400 K/uL   Neutrophils Relative % 62 %   Neutro Abs 5.5 1.7 - 7.7 K/uL   Lymphocytes Relative 22 %   Lymphs Abs 2.0 0.7 - 4.0 K/uL   Monocytes Relative 13 %   Monocytes Absolute 1.2 (H) 0.1 - 1.0 K/uL   Eosinophils Relative 2 %   Eosinophils Absolute 0.2 0.0 - 0.7 K/uL   Basophils Relative 1 %   Basophils Absolute 0.1 0.0 - 0.1 K/uL  Comprehensive metabolic panel     Status: Abnormal   Collection Time: 05/01/15  5:35 AM  Result Value Ref Range   Sodium 134 (L) 135 - 145 mmol/L   Potassium 3.8 3.5 - 5.1 mmol/L   Chloride 99 (L) 101 - 111 mmol/L   CO2 29 22 - 32 mmol/L   Glucose, Bld 104 (H) 65 - 99 mg/dL   BUN 6 6 - 20 mg/dL   Creatinine, Ser 0.91 0.61 - 1.24 mg/dL   Calcium 7.9 (L) 8.9 - 10.3 mg/dL   Total Protein 6.4 (L) 6.5 - 8.1 g/dL   Albumin 1.6 (L) 3.5 - 5.0 g/dL   AST 34 15 - 41 U/L   ALT 12 (L) 17 - 63 U/L   Alkaline Phosphatase 136 (H) 38 - 126 U/L   Total Bilirubin 0.6 0.3 - 1.2 mg/dL   GFR calc non Af  Amer >60 >60 mL/min   GFR calc Af Amer >60 >60 mL/min    Comment: (NOTE) The eGFR has been calculated using the CKD EPI equation. This calculation has not been validated in all clinical situations. eGFR's persistently <60 mL/min signify possible Chronic Kidney Disease.    Anion gap 6 5 - 15  Protime-INR     Status: Abnormal   Collection Time: 05/01/15  5:35 AM  Result Value Ref Range   Prothrombin Time 23.4 (H) 11.6 - 15.2 seconds   INR 2.10 (H) 0.00 - 1.49     HEENT: normal Cardio: RRR and no murmur Resp: CTA B/L and unlabored GI: BS positive and NT, ND Extremity:  Pulses positive and No Edema Skin:   Breakdown Right foot with erythema and small areas of black eschar on great toe and Wound midline abd incision healing well staples removed Neuro: Alert/Oriented, Anxious, Abnormal Sensory reduced sensation to LT in left fingers and Right  medial and ant thigh and Abnormal Motor 4/5 B Knee ext , 3- Left hand grip, 3- Right ankle , 4- Left ankle , RUE normal Musc/Skel:  Other healed incision bilateral knee Gen NAD except complained of fatigue with minmal activity   Assessment/Plan: 1. Functional deficits secondary  to debilitation/sepsis/multi-medical/MRSA abscesses with multiple irrigation and debridements with rectus flap procedure 04/19/2015  which require 3+ hours per day of interdisciplinary therapy in a comprehensive inpatient rehab setting. Physiatrist is providing close team supervision and 24 hour management of active medical problems listed below. Physiatrist and rehab team continue to assess barriers to discharge/monitor patient progress toward functional and medical goals.  FIM: Function - Bathing Position: Sitting EOB Body parts bathed by patient: Right arm, Left arm, Chest, Abdomen, Front perineal area, Right upper leg, Left upper leg Body parts bathed by helper: Right lower leg, Left lower leg, Back, Buttocks Assist Level: Touching or steadying assistance(Pt > 75%)  Function- Upper Body Dressing/Undressing What is the patient wearing?: Hospital gown Assist Level: Touching or steadying assistance(Pt > 75%) Function - Lower Body Dressing/Undressing What is the patient wearing?: Underwear, Pants, Ted Hose, Non-skid slipper socks Underwear - Performed by helper: Thread/unthread right underwear leg, Thread/unthread left underwear leg, Pull underwear up/down Pants- Performed by helper: Thread/unthread right pants leg, Thread/unthread left pants leg, Pull pants up/down Non-skid slipper socks- Performed by helper: Don/doff right sock (only wearing one sock (on the right)) TED Hose - Performed by helper: Don/doff left TED hose (only one TED hose- on the left) Assist for footwear: Maximal assist Assist for lower body dressing: Touching or steadying assistance (Pt > 75%)    Medical Problem List and Plan: 1. 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  DVT Prophylaxis/Anticoagulation: Coumadin for thromboembolism. Monitor for any bleeding episodes 3. Pain Management: MS Contin 90 mg twice a day, oxycodone for breakthrough pain. Monitor with increased mobility 4. Mood: Effexor XR 75 mg daily, Klonopin 0.5 mg 4 times a day as needed 5. Neuropsych: This patient is capable of making decisions on his own behalf. 6. Skin/Wound Care: Skin care is supervised to right foot. 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 Sharol Given 7. Fluids/Electrolytes/Nutrition: Strict I and O's with follow-up chemistries 8.ID. Presently on Ancef 2 g 3 times a day. ID to address duration 9. Tachycardia SVT. Follow-up cardiology services. Continue Lopressor 50 mg twice a day for now,  HR ok at 88 this am 10. Constipation. Laxitive  assistance 11.  Severe protein malnutrition, alb 1.6- supplement with beneprotein    Function - Chair/bed transfer Chair/bed transfer method: Lateral scoot, Stand pivot Chair/bed transfer assist level: 2 helpers (min A x 1; mod A x 1 for scoot; mod-max A x 2 SPT) Chair/bed transfer assistive device: Sliding board Chair/bed transfer details: Verbal cues for safe use of DME/AE, Verbal cues for sequencing, Verbal cues for technique, Verbal cues for precautions/safety, Manual facilitation for placement, Manual facilitation for weight shifting  Function - Locomotion: Wheelchair Will patient use wheelchair at discharge?: Yes Type: Manual Max wheelchair distance: 25' Assist Level: Touching or steadying assistance (Pt > 75%) Wheel 50 feet with 2 turns activity did not occur: Safety/medical concerns Wheel 150 feet activity did not occur: Safety/medical concerns Turns around,maneuvers to table,bed, and  toilet,negotiates 3% grade,maneuvers on rugs and over doorsills: No Function - Locomotion: Ambulation Assistive device: Walker-platform Max distance: 1' Assist level: 2 helpers (mod A x 2) Walk 10 feet activity did not occur: Safety/medical concerns Walk 50 feet with 2 turns activity did not occur: Safety/medical concerns Walk 150 feet activity did not occur: Safety/medical concerns Walk 10 feet on uneven surfaces activity did not occur: Safety/medical concerns  Function - Comprehension Comprehension: Auditory Comprehension assist level: Follows basic conversation/direction with no assist  Function - Expression Expression: Verbal Expression assist level: Expresses basic needs/ideas: With extra time/assistive device  Function - Social Interaction Social Interaction assist level: Interacts appropriately 90% of the time - Needs monitoring or encouragement for participation or interaction.  Function - Problem Solving Problem solving assist level: Solves basic 90% of the time/requires cueing < 10% of the time  Function - Memory Memory assist level: Complete Independence: No helper Patient normally able to recall (first 3 days only): Current season, Location of own room, Staff names and faces, That he or she is in a hospital    LOS (Days) 2 A FACE TO [place] E 05/02/2015, 8:29 AM

## 2015-05-02 NOTE — Plan of Care (Signed)
Problem: RH BOWEL ELIMINATION Goal: RH STG MANAGE BOWEL W/EQUIPMENT W/ASSISTANCE STG Manage Bowel With Equipment With Assistance  Outcome: Not Progressing Pt still having issues with bowel movement production. Pt had small bowel movement today.

## 2015-05-02 NOTE — Progress Notes (Signed)
Physical Therapy Session Note  Patient Details  Name: [name] MRN: [id number] Date of Birth: [birth year]  Today's Date: 05/02/2015 PT Individual Time: 0830-0930 Treatment Session 2: 2952-8413 PT Individual Time Calculation (min): 60 min Treatment Session 2: 60 min  Short Term Goals: Week 1:  PT Short Term Goal 1 (Week 1): Pt will initiate ambulation during PT session.  PT Short Term Goal 2 (Week 1): Pt will tolerate sitting up in w/c oob x 4 hours.  PT Short Term Goal 3 (Week 1): Pt will initiate stand-pivot bed to/from w/c transfers.  PT Short Term Goal 4 (Week 1): Pt will initiate stair training during PT session.  PT Short Term Goal 5 (Week 1): Pt will demonstrate 1 person assist scoot transfers with slideboard req mod cues for head hips relationship.   Skilled Therapeutic Interventions/Progress Updates:    Treatment Session 1: Pt received in bed with IV and foley attached and L wrist splint in place. Pt reports he has not yet eaten breakfast or taken medication. Nurse tech notified and reports pt has taken medication and that breakfast has been ordered and will be delivered soon. Therapeutic Activity - Pt completes bed mobility supine to sit from flat bed req steadying assist once in sit to prevent sliding forward on inflated air mattress. Air mattress remains inflated for pt comfort. PT instructs pt in sit to stand from eob with R bedrail and L PFRW x 2 reps - height of L platform adjusted for improved forearm support in stand req mod A to achieve standing each rep. PT instructs pt in scoot transfer with slideboard from bed to w/c to pt's L - good use of head-hips relationship with verbal and tactile cues, but pt continues to req verbal cues for hand placement (min A). Therapeutic Exercise - PT instructs pt in bed level R LE rom and strengthening exercises: ankle pumps, heel slides, laq, and seated knee flexion with R foot partial dangling x 10 reps. B LE rom and strengthening exercise: 5  second isometric hip adduction in sit with soft ball between legs, hip ER in sit with orange PEP band around knees x 10 reps each. L arm flexion overhead x 10 reps with verbal cues for neutral shoulder rotation to prevent impingement position x 10 reps. Neuromuscular Reeducation: Once in stand with L PFRW, PT instructs pt in dynamic standing balance activity weight shift R/L x 10 reps in stand - movements of small excursion noted. W/C Management - PT instructs pt in w/c propulsion with R arm and L leg (shoe donned) x 50' with 2 turns req sba and encouragement for this distance while PT manages IV pole. Pt ended up in w/c with all needs in reach. Continue per PT POC.   Treatment Session 2: Pt received up in w/c with wife present entire session, agreeable to PT, but moaning and req encouragement throughout session. Gait Training - see function tab for details. Pt req assist to place L forearm in platform, and encouragement for this distance, as well as assist with walker and IV pole management. W/C Management - see function tab for details - pt uses R UE and L LE to self propel and verbal cues to use foot to steer more in order to avoid scraping arm on L side of wall. Backrest joints tightened to reduce reclining play in solid backrest. Therapeutic Activity - see function tab for sit to stand, transfers, and car transfer details. Pt unable to get R leg into car  due to fatigue and not wishing to scoot any further, as well as swelling, limiting R knee flexion, so transfer graded as tot A, even though it was min-mod A to scoot with slideboard into car, except for R leg. Pt ended in bed with wife present and all needs in reach. Continue per PT POC.    Therapy Documentation Precautions:  Precautions Precautions: Fall Precaution Comments: no specific weight bearing order but avoided weight bearing through L wrist; wound to right LE and groin Restrictions Weight Bearing Restrictions: Yes Other Position/Activity  Restrictions: see above Pain: Pain Assessment Pain Assessment: 0-10 Pain Score: 9  Pain Type: Acute pain;Neuropathic pain Pain Location: Leg Pain Orientation: Right Pain Descriptors / Indicators: Sharp Pain Frequency: Constant Pain Onset: On-going Pain Intervention(s): Rest;Medication (See eMAR) Multiple Pain Sites: Yes 2nd Pain Site Pain Score: 7 Pain Type: Acute pain Pain Location: Wrist Pain Orientation: Left Pain Descriptors / Indicators: Sharp;Aching Pain Onset: On-going Pain Intervention(s): Rest;Medication (See eMAR) Treatment Session 2: Pt c/o 8/10 pain in B legs and L wrist - PT offers emotional support and offers an ice pack for at the end of therapy.    See Function Navigator for Current Functional Status.   Therapy/Group: Individual Therapy  Chaske Paskett M 05/02/2015, 8:46 AM

## 2015-05-02 NOTE — Progress Notes (Signed)
POD# 13 rectus flap to right groin/thigh, over sewing femoral vein  Temp:  [99.5 F (37.5 C)-99.6 F (37.6 C)] 99.6 F (37.6 C) (10/06 1345) Pulse Rate:  [87-97] 87 (10/06 1345) Resp:  [17-18] 17 (10/06 1345) BP: (125-135)/(67-83) 135/67 mmHg (10/06 1345) SpO2:  [96 %-97 %] 97 % (10/06 1345)   Reports no significant drainage from thigh or drain exit site    PE Abdomen not examined Right groin incision dry, intact RLE proximal thigh : open wound with guaze packed in wound and silver alginate placed over this, then dry dressing Wound 100 % granulation no slough nearly flush with skin edges  A/P S/p rectus muscle flap to prior abscess cavity over stumps of vessels  Removed remainder staples.  Please place silver alginate DIRECTLY WITHIN WOUND. Continue every other day , ok to remove dressing to shower.   Glenna Fellows, MD [facility] [phone]

## 2015-05-02 NOTE — Progress Notes (Signed)
ANTICOAGULATION CONSULT NOTE - Follow Up Consult  Pharmacy Consult for coumadin Indication: DVT  Allergies  Allergen Reactions  . Imitrex [Sumatriptan] Other (See Comments)    Chest pain, 2004 (tolerates 50 mg prn)  . Verapamil Other (See Comments)    Causes pvc's    Patient Measurements: Weight: 271 lb (122.925 kg) Heparin Dosing Weight:   Vital Signs: Temp: 99.5 F (37.5 C) (10/06 0459) Temp Source: Oral (10/06 0459) BP: 131/83 mmHg (10/06 0459) Pulse Rate: 88 (10/06 0459)  Labs:  Recent Labs  04/30/15 0520 05/01/15 0535  HGB 8.2* 7.9*  HCT 26.1* 24.7*  PLT 364 333  LABPROT 23.4* 23.4*  INR 2.10* 2.10*  CREATININE 0.92 0.91    Estimated Creatinine Clearance: 126.6 mL/min (by C-G formula based on Cr of 0.91).   Medications:  Scheduled:  .  ceFAZolin (ANCEF) IV  2 g Intravenous 3 times per day  . docusate sodium  100 mg Oral BID  . feeding supplement (ENSURE ENLIVE)  237 mL Oral TID BM  . metoprolol tartrate  75 mg Oral BID  . morphine  90 mg Oral 2 times per day  . naloxegol oxalate  25 mg Oral Daily  . protein supplement  1 scoop Oral TID WC  . venlafaxine XR  75 mg Oral Q breakfast  . Warfarin - Pharmacist Dosing Inpatient   Does not apply q1800   Infusions:    Assessment: 59 yo male with DVT is currently on therapeutic coumadin.  INR today is 2.3.  Goal of Therapy:  INR 2-3 Monitor platelets by anticoagulation protocol: Yes   Plan:  - coumadin 5 mg po x1 - INR in am  Karsyn Rochin, Tsz-Yin 05/02/2015,8:31 AM

## 2015-05-02 NOTE — Patient Care Conference (Signed)
Inpatient RehabilitationTeam Conference and Plan of Care Update Date: 05/01/2015   Time: 11:45 AM    Patient Name: [name]      Medical Record Number: [id number]  Date of Birth: 1956/03/20 Sex: Male         Room/Bed: 4M13C/4M13C-01 Payor Info: Payor: MEDICAID [place] / Plan: MEDICAID [place] ACCESS / Product Type: *No Product type* /    Admitting Diagnosis: Sepsis  Admit Date/Time:  04/30/2015  4:47 PM Admission Comments: No comment available   Primary Diagnosis:  <principal problem not specified> Principal Problem: <principal problem not specified>  Patient Active Problem List   Diagnosis Date Noted  . Debility 05/01/2015  . Sepsis (HCC) 04/30/2015  . Acute renal failure with tubular necrosis (HCC)   . Abscess of left hand   . Staphylococcus aureus bacteremia with sepsis (HCC)   . Colles' fracture of left radius   . Cellulitis of left upper extremity   . Diastolic dysfunction   . Radial fracture   . Abscess   . Bacteremia due to Staphylococcus   . Pulmonary hypertension (HCC)   . Acute blood loss anemia   . Aneurysm of right femoral artery (HCC)   . DVT, lower extremity (HCC)   . Radius distal fracture   . Cellulitis of finger of left hand   . Acute pyelonephritis   . Arm pain, anterior   . Cellulitis   . Bacteremia due to Staphylococcus aureus   . Acute renal failure syndrome (HCC)   . Fracture of left radius   . ARF (acute renal failure) (HCC) 04/07/2015  . Severe sepsis (HCC) 04/07/2015  . Cellulitis of left hand 04/07/2015  . Elevated INR 04/07/2015  . Foot swelling 04/07/2015  . Elevated bilirubin 04/07/2015  . Hypokalemia 04/07/2015  . Hypomagnesemia 04/07/2015  . 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 Bacteremia 04/07/2015  . UTI (lower urinary tract infection)   . Chest pain 11/15/2013  . History of duodenal ulcer 11/15/2013  . 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 Septic shock due to abscess 07/13/2013  . Chronic use of opiate drugs therapeutic  purposes 07/12/2013  . Abscess of left thigh 07/11/2013  . Acute renal failure (HCC) 07/11/2013  . Chronic pain syndrome 07/11/2013  . Testosterone deficiency 07/11/2013    Expected Discharge Date:    Team Members Present:       Current Status/Progress Goal Weekly Team Focus  Medical   chronic pain with post op pain  home with sup  improve activity tolerance   Bowel/Bladder   Foley for urinary retention. Continent of bowel. LBM 04/29/15. Sorbitol given   Remain continent of bowel and manage bladder with min assist  Monitor urine and encourage bowel medications.    Swallow/Nutrition/ Hydration             ADL's   max A with lateral leans for clothing mangement, unable to stand during eval. Mod A +2 for slide board transfer  supervision to min A overall  transfers, sit to stands, bed mobility, activity tolerance   Mobility   grossly max A  supervision  progressive gait and w/c mobility, slideboard transfers, sit to stands, bed mobility   Communication             Safety/Cognition/ Behavioral Observations  Pt is following safety precautions appropriately.  Pt will remain free of falls and injury with min assist   Encourage use of safety precautions.    Pain   Pt rating pain at 8-10 consistenly.  Pain managed on Oxycontin 12 hr 2X/day. Oxy IR Q6. Pain is chronic.   Manage pain at 5 or less on a scale of 0-10  Assess pain Q4 hours and treat accordingly.    Skin   Wound to left wrist. Petroleum dressing with dry dressing Q shift. Wound to right thigh/groin. Wet-to-dry with aquacel and dry dressing. Change dressing every other day. Abdominal wound OTA. Groin red and swollen. Microgaurd powder applied   Remain free of infection and furthur skin breakdown.   Assess skin Q shift and prn. Monitor wounds for signs of infection and encourage pressure relief methods.     Rehab Goals Patient on target to meet rehab goals: Yes Rehab Goals Revised: none - pt's first conference *See Care Plan  and progress notes for long and short-term goals.  Barriers to Discharge: chronic issues as well as acute    Possible Resolutions to Barriers:  initiate rehab    Discharge Planning/Teaching Needs:  Pt to return to his home where his wife and two adult sons live and they will be with him 24/7.  Pt's wife is here often and can attend family education when appropriate.   Team Discussion:  Pt could not tolerate standing with OT due to pain.  PT needs to try a wide RW with platform to see if that would work better for him, but he didn't want to try standard RW.  Pt has areas on his right leg where nursing is monitoring his skin.  Pt has chronic pain issues prior to admission.  Revisions to Treatment Plan:  none   Continued Need for Acute Rehabilitation Level of Care: The patient requires daily medical management by a physician with specialized training in physical medicine and rehabilitation for the following conditions: Daily direction of a multidisciplinary physical rehabilitation program to ensure safe treatment while eliciting the highest outcome that is of practical value to the patient.: Yes Daily medical management of patient stability for increased activity during participation in an intensive rehabilitation regime.: Yes Daily analysis of laboratory values and/or radiology reports with any subsequent need for medication adjustment of medical intervention for : Post surgical problems  Daina Cara, [place] 05/02/2015, 10:19 AM

## 2015-05-02 NOTE — Plan of Care (Signed)
Problem: RH BOWEL ELIMINATION Goal: RH STG MANAGE BOWEL W/MEDICATION W/ASSISTANCE STG Manage Bowel with Medication with Assistance.  Outcome: Progressing Min  Goal: RH STG MANAGE BOWEL W/EQUIPMENT W/ASSISTANCE STG Manage Bowel With Equipment With Assistance  Outcome: Progressing Min

## 2015-05-03 ENCOUNTER — Inpatient Hospital Stay (HOSPITAL_COMMUNITY): Payer: Medicaid Other | Admitting: Occupational Therapy

## 2015-05-03 ENCOUNTER — Inpatient Hospital Stay (HOSPITAL_COMMUNITY): Payer: Medicaid Other

## 2015-05-03 ENCOUNTER — Inpatient Hospital Stay (HOSPITAL_COMMUNITY): Payer: Medicaid Other | Admitting: Physical Therapy

## 2015-05-03 DIAGNOSIS — G5721 Lesion of femoral nerve, right lower limb: Secondary | ICD-10-CM

## 2015-05-03 LAB — URINE MICROSCOPIC-ADD ON

## 2015-05-03 LAB — URINALYSIS, ROUTINE W REFLEX MICROSCOPIC
Bilirubin Urine: NEGATIVE
GLUCOSE, UA: NEGATIVE mg/dL
KETONES UR: NEGATIVE mg/dL
LEUKOCYTES UA: NEGATIVE
Nitrite: NEGATIVE
PROTEIN: 30 mg/dL — AB
Specific Gravity, Urine: 1.015 (ref 1.005–1.030)
UROBILINOGEN UA: 1 mg/dL (ref 0.0–1.0)
pH: 7 (ref 5.0–8.0)

## 2015-05-03 LAB — PROTIME-INR
INR: 2.7 — ABNORMAL HIGH (ref 0.00–1.49)
Prothrombin Time: 28.2 seconds — ABNORMAL HIGH (ref 11.6–15.2)

## 2015-05-03 MED ORDER — WARFARIN SODIUM 5 MG PO TABS
5.0000 mg | ORAL_TABLET | Freq: Every day | ORAL | Status: DC
  Administered 2015-05-03: 5 mg via ORAL
  Filled 2015-05-03: qty 1

## 2015-05-03 NOTE — Progress Notes (Signed)
Occupational Therapy Session Note  Patient Details  Name: [name] MRN: [id number] Date of Birth: [birth year]  Today's Date: 05/03/2015 OT Individual Time: 1000-1100 OT Individual Time Calculation (min): 60 min    Short Term Goals: Week 1:  OT Short Term Goal 1 (Week 1): Pt will transfer to [facility] with +1 max A  OT Short Term Goal 2 (Week 1): Pt will don shirt with setup OT Short Term Goal 3 (Week 1): Pt will thread LB clothing with mod A  OT Short Term Goal 4 (Week 1): Pt will perform bed mobility supine <>sit with min A with extra time OT Short Term Goal 5 (Week 1): Pt will perform sit to stand with max A in prep for clothing management   Skilled Therapeutic Interventions/Progress Updates: Therapeutic activity with focus on slide board tranfers, squat-pivot transfer, pain management, AE training (reacher, LH sponge).   Therex with focus on PRE at RUE and assisted isometric exercise of left elbow, with therapist facilitating to inhibit WB through left wrist.   Pt received seated in w/c complaining of 8/10 pain at right hip from prolonged sitting.   After setup to prepare bed, place w/c and manage IV, OT re-educated pt on slide-board transfer, weigh-shifting through anterior lean toward left LLE to promote improved lift-off of board.   Pt advanced with extra time and overall mod assist to lift.   Pt recovers to EOB and completes sitting to side lying with mod assist to lower trunk and place RLE.    Pt then preforms BUE therex with 1:1 assist to anchor theraband and manual facilitation to provide blocking for LUE isometric exercise at elbow.   Pt left in bed at end of session with all needs within reach as  OT provides introductory education on use of reacher and LH sponge to improve performance with BADL.     Therapy Documentation Precautions:  Precautions Precautions: Fall Precaution Comments: no specific weight bearing order but avoided weight bearing through L wrist; wound to right LE and  groin Restrictions Weight Bearing Restrictions: Yes Other Position/Activity Restrictions: see above  Pain: Pain Assessment Pain Assessment: 0-10 Pain Score: 8  Pain Type: Acute pain Pain Location: Leg Pain Orientation: Right;Proximal Pain Descriptors / Indicators: Aching Pain Frequency: Intermittent Pain Onset: On-going Patients Stated Pain Goal: 3 Pain Intervention(s): Medication (See eMAR);Repositioned Multiple Pain Sites: No  ADL: ADL ADL Comments: see Care tool function  See Function Navigator for Current Functional Status.   Therapy/Group: Individual Therapy  [name] 05/03/2015, 12:57 PM

## 2015-05-03 NOTE — Progress Notes (Signed)
Occupational Therapy Session Note  Patient Details  Name: [name] MRN: [id number] Date of Birth: [birth year]  Today's Date: 05/03/2015 OT Individual Time: 3818-2993 OT Individual Time Calculation (min): 60 min    Short Term Goals: Week 1:  OT Short Term Goal 1 (Week 1): Pt will transfer to [facility] with +1 max A  OT Short Term Goal 2 (Week 1): Pt will don shirt with setup OT Short Term Goal 3 (Week 1): Pt will thread LB clothing with mod A  OT Short Term Goal 4 (Week 1): Pt will perform bed mobility supine <>sit with min A with extra time OT Short Term Goal 5 (Week 1): Pt will perform sit to stand with max A in prep for clothing management   Skilled Therapeutic Interventions/Progress Updates:    Pt seen this session to facilitate functional mobility and ADL transfers. Pt received in bed, sat to EOB using rails with S only.  Rewrapped L wrist and applied splint.  Worked on sitting balance and tolerance at EOB, he was then able to use slide board with set up assist only to stabilize board as he was transferring on it. Pt was able to use BLE to push through with scoots versus sliding.  He demonstrated good balance on BSC and practiced lateral leans. Did not need to toilet.  He then transferred to wc with board with set up assist using scoots. Completed grooming at sink., set up in chair at end of session with all needs met.  Therapy Documentation Precautions:  Precautions Precautions: Fall Precaution Comments: no specific weight bearing order but avoided weight bearing through L wrist; wound to right LE and groin Restrictions Weight Bearing Restrictions: Yes Other Position/Activity Restrictions: see above      Pain: Pain Assessment Pain Assessment: 0-10 Pain Score: 8  Pain Type: Acute pain;Surgical pain Pain Location: Leg Pain Orientation: Right;Proximal Pain Frequency: Intermittent Pain Onset: Progressive Patients Stated Pain Goal: 3 Pain Intervention(s):  Repositioned;Distraction;Elevated extremity ADL: ADL ADL Comments: see Care tool function  See Function Navigator for Current Functional Status.   Therapy/Group: Individual Therapy  [place] 05/03/2015, 10:45 AM

## 2015-05-03 NOTE — Progress Notes (Signed)
59 y.o. right handed male history of hypertension, chronic knee pain, peptic ulcer disease, MRSA abscesses, peripheral neuropathy. Independent with occasional cane and walker prior to admission living with wife, in a home with facilities on the first floor and 4 steps to enter. Presented 04/07/2015 with recent fall 04/03/2015 with increasing left hand and wrist pain. In the ED noted to be hypotensive tachycardia and white blood cell count of 31,300. Placed on broad-spectrum anti-biotic's. MRI of left hand show dorsal distal radius transverse fracture, diffuse edema of the hand likely reactive to distal radius fracture, tear of the dorsal band of the scaphounate ligament. Multiple small deep fluid collections in the hands compatible with hematoma question superimposed infection. CT of right femur due to pain showed a 7.4 cm process involving or adjacent to the proximal right SFA question aneurysm, pseudoaneurysm or hematoma. 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 Bridgett Larsson. . In regards to abscess of left dorsal hand underwent incision and drainage 04/23/2015 per Dr. Lenon Curt  Subjective/Complaints: Good day in therapy yesterday  No SOB or CP  Knee pain is chronic and is the most troublesome pain today.  No abd pain or R foot or L wrist pain noted  Objective: Vital Signs: Blood pressure 154/59, pulse 96, temperature 99.4 F (37.4 C), temperature source Oral, resp. rate 18, weight 122.925 kg (271 lb), SpO2 97 %. No results found. Results for orders placed or performed during the hospital encounter of 04/30/15 (from the past 72 hour(s))  CBC WITH DIFFERENTIAL     Status: Abnormal   Collection Time: 05/01/15  5:35 AM  Result Value Ref Range   WBC 8.9 4.0 - 10.5 K/uL   RBC 2.85 (L) 4.22 - 5.81 MIL/uL   Hemoglobin 7.9 (L) 13.0 - 17.0 g/dL   HCT 24.7 (L) 39.0 - 52.0 %   MCV 86.7 78.0  - 100.0 fL   MCH 27.7 26.0 - 34.0 pg   MCHC 32.0 30.0 - 36.0 g/dL   RDW 15.6 (H) 11.5 - 15.5 %   Platelets 333 150 - 400 K/uL   Neutrophils Relative % 62 %   Neutro Abs 5.5 1.7 - 7.7 K/uL   Lymphocytes Relative 22 %   Lymphs Abs 2.0 0.7 - 4.0 K/uL   Monocytes Relative 13 %   Monocytes Absolute 1.2 (H) 0.1 - 1.0 K/uL   Eosinophils Relative 2 %   Eosinophils Absolute 0.2 0.0 - 0.7 K/uL   Basophils Relative 1 %   Basophils Absolute 0.1 0.0 - 0.1 K/uL  Comprehensive metabolic panel     Status: Abnormal   Collection Time: 05/01/15  5:35 AM  Result Value Ref Range   Sodium 134 (L) 135 - 145 mmol/L   Potassium 3.8 3.5 - 5.1 mmol/L   Chloride 99 (L) 101 - 111 mmol/L   CO2 29 22 - 32 mmol/L   Glucose, Bld 104 (H) 65 - 99 mg/dL   BUN 6 6 - 20 mg/dL   Creatinine, Ser 0.91 0.61 - 1.24 mg/dL   Calcium 7.9 (L) 8.9 - 10.3 mg/dL   Total Protein 6.4 (L) 6.5 - 8.1 g/dL   Albumin 1.6 (L) 3.5 - 5.0 g/dL   AST 34 15 - 41 U/L   ALT 12 (L) 17 - 63 U/L   Alkaline Phosphatase 136 (H) 38 - 126 U/L   Total Bilirubin 0.6 0.3 - 1.2 mg/dL   GFR calc non Af  Amer >60 >60 mL/min   GFR calc Af Amer >60 >60 mL/min    Comment: (NOTE) The eGFR has been calculated using the CKD EPI equation. This calculation has not been validated in all clinical situations. eGFR's persistently <60 mL/min signify possible Chronic Kidney Disease.    Anion gap 6 5 - 15  Protime-INR     Status: Abnormal   Collection Time: 05/01/15  5:35 AM  Result Value Ref Range   Prothrombin Time 23.4 (H) 11.6 - 15.2 seconds   INR 2.10 (H) 0.00 - 1.49  Protime-INR     Status: Abnormal   Collection Time: 05/02/15  8:25 AM  Result Value Ref Range   Prothrombin Time 25.1 (H) 11.6 - 15.2 seconds   INR 2.30 (H) 0.00 - 1.49  Protime-INR     Status: Abnormal   Collection Time: 05/03/15  4:44 AM  Result Value Ref Range   Prothrombin Time 28.2 (H) 11.6 - 15.2 seconds   INR 2.70 (H) 0.00 - 1.49     HEENT: normal Cardio: RRR and no  murmur Resp: CTA B/L and unlabored GI: BS positive and NT, ND Extremity:  Pulses positive and No Edema Skin:   Breakdown Right foot with erythema and small areas of black eschar on great toe and Wound midline abd incision healing well staples removed Neuro: Alert/Oriented, Anxious, Abnormal Sensory reduced sensation to LT in left fingers and Right medial and ant thigh and right medial leg   Abnormal Motor 4- R hip add, 5/5 left Hip add  4/5 left  Knee ext , 2- RIght Knee ext , 3- Left hand grip, 3- Right ankle , 4- Left ankle , RUE normal Musc/Skel:  Other healed incision bilateral knee Gen NAD except complained of fatigue with minmal activity   Assessment/Plan: 1. 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 Physiatrist is providing close team supervision and 24 hour management of active medical problems listed below. Physiatrist and rehab team continue to assess barriers to discharge/monitor patient progress toward functional and medical goals.  FIM: Function - Bathing Position: Sitting EOB Body parts bathed by patient: Right arm, Left arm, Chest, Abdomen, Front perineal area, Right upper leg, Left upper leg Body parts bathed by helper: Right lower leg, Left lower leg, Back, Buttocks Assist Level: Touching or steadying assistance(Pt > 75%)  Function- Upper Body Dressing/Undressing What is the patient wearing?: Hospital gown Assist Level: Touching or steadying assistance(Pt > 75%) Function - Lower Body Dressing/Undressing What is the patient wearing?: Underwear, Pants, Ted Hose, Non-skid slipper socks Underwear - Performed by helper: Thread/unthread right underwear leg, Thread/unthread left underwear leg, Pull underwear  up/down Pants- Performed by helper: Thread/unthread right pants leg, Thread/unthread left pants leg, Pull pants up/down Non-skid slipper socks- Performed by helper: Don/doff right sock (only wearing one sock (on the right)) TED Hose - Performed by helper: Don/doff left TED hose (only one TED hose- on the left) Assist for footwear: Maximal assist Assist for lower body dressing: Touching or steadying assistance (Pt > 75%)    Medical Problem List and Plan: 1. 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  DVT Prophylaxis/Anticoagulation: Coumadin for thromboembolism. Monitor for any bleeding episodes 3. Pain Management: MS Contin 90  mg twice a day, oxycodone for breakthrough pain. Monitor with increased mobility, at this point not much nerve pain in RLE 4. Mood: Effexor XR 75 mg daily, Klonopin 0.5 mg 4 times a day as needed 5. Neuropsych: This patient is capable of making decisions on his own behalf. 6. Skin/Wound Care: Skin care is supervised to right foot. 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 Sharol Given 7. Fluids/Electrolytes/Nutrition: Strict I and O's with follow-up chemistries 8.ID. Presently on Ancef 2 g 3 times a day. ID rec total 6 wks 9. Tachycardia SVT. Follow-up cardiology services. Continue Lopressor 50 mg twice a day for now, HR ok at 96 this am 10. Constipation. Laxitive  assistance 11.  Severe protein malnutrition, alb 1.6- supplement with beneprotein, enc hi protein diet choices    Function - Chair/bed transfer Chair/bed transfer method: Lateral scoot Chair/bed transfer assist level: Touching or steadying assistance (Pt > 75%) Chair/bed transfer assistive device: Sliding board, Armrests Chair/bed transfer details: Verbal cues for safe use of DME/AE, Manual facilitation for  weight shifting, Manual facilitation for placement, Verbal cues for technique, Verbal cues for precautions/safety  Function - Locomotion: Wheelchair Will patient use wheelchair at discharge?: Yes Type: Manual Max wheelchair distance: 100' Assist Level: Touching or steadying assistance (Pt > 75%) Wheel 50 feet with 2 turns activity did not occur: Safety/medical concerns Assist Level: Touching or steadying assistance (Pt > 75%) Wheel 150 feet activity did not occur: Safety/medical concerns Turns around,maneuvers to table,bed, and toilet,negotiates 3% grade,maneuvers on rugs and over doorsills: No (threshold negotiation only) Function - Locomotion: Ambulation Assistive device: Walker-platform Max distance: 15' Assist level: 2 helpers (mod A x 2 with w/c follow +3) Walk 10 feet activity did not occur: Safety/medical concerns Assist level: 2 helpers Walk 50 feet with 2 turns activity did not occur: Safety/medical concerns Walk 150 feet activity did not occur: Safety/medical concerns Walk 10 feet on uneven surfaces activity did not occur: Safety/medical concerns  Function - Comprehension Comprehension: Auditory Comprehension assist level: Follows basic conversation/direction with no assist  Function - Expression Expression: Verbal Expression assist level: Expresses basic needs/ideas: With extra time/assistive device  Function - Social Interaction Social Interaction assist level: Interacts appropriately 90% of the time - Needs monitoring or encouragement for participation or interaction.  Function - Problem Solving Problem solving assist level: Solves basic 90% of the time/requires cueing < 10% of the time  Function - Memory Memory assist level: Recognizes or recalls 90% of the time/requires cueing < 10% of the time Patient normally able to recall (first 3 days only): Current season, Location of own room, Staff names and faces, That he or she is in a hospital    LOS (Days) 3 A FACE  TO [place] E 05/03/2015, 8:28 AM

## 2015-05-03 NOTE — Progress Notes (Signed)
Occupational Therapy Note  Patient Details  Name: [name] MRN: [id number] Date of Birth: 25-Nov-1955  Today's Date: 05/03/2015 OT Individual Time: 1300-1330 OT Individual Time Calculation (min): 30 min   Pt c/o discomfort in L hand with increased pain when using for bed mobility and transfers; RN aware Individual Therapy  Pt resting in bed upon arrival.  OT session focus on bed mobility, sitting balance, activity tolerance.  Therapist educated pt on importance of of AROM of L hand fingers to facilitate fluid movement and edema management.  Pt's L hand considerably warmer than R hand. RN notified.   Lavone Neri [facility] 05/03/2015, 2:40 PM

## 2015-05-03 NOTE — Progress Notes (Signed)
ANTICOAGULATION CONSULT NOTE - Follow Up Consult  Pharmacy Consult for coumadin Indication: DVT  Allergies  Allergen Reactions  . Imitrex [Sumatriptan] Other (See Comments)    Chest pain, 2004 (tolerates 50 mg prn)  . Verapamil Other (See Comments)    Causes pvc's    Patient Measurements: Weight: 271 lb (122.925 kg) Heparin Dosing Weight:   Vital Signs: Temp: 99.4 F (37.4 C) (10/07 0510) Temp Source: Oral (10/07 0510) BP: 154/59 mmHg (10/07 0510) Pulse Rate: 96 (10/07 0510)  Labs:  Recent Labs  05/01/15 0535 05/02/15 0825 05/03/15 0444  HGB 7.9*  --   --   HCT 24.7*  --   --   PLT 333  --   --   LABPROT 23.4* 25.1* 28.2*  INR 2.10* 2.30* 2.70*  CREATININE 0.91  --   --     Estimated Creatinine Clearance: 126.6 mL/min (by C-G formula based on Cr of 0.91).   Medications:  Scheduled:  .  ceFAZolin (ANCEF) IV  2 g Intravenous 3 times per day  . docusate sodium  100 mg Oral BID  . feeding supplement (ENSURE ENLIVE)  237 mL Oral TID BM  . metoprolol tartrate  75 mg Oral BID  . morphine  90 mg Oral 2 times per day  . naloxegol oxalate  25 mg Oral Daily  . protein supplement  1 scoop Oral TID WC  . venlafaxine XR  75 mg Oral Q breakfast  . Warfarin - Pharmacist Dosing Inpatient   Does not apply q1800   Infusions:    Assessment: 59 yo male with DVT is currently on therapeutic coumadin.  INR today is 2.3 > 2.7. No bleeding noted per chart.   Goal of Therapy:  INR 2-3 Monitor platelets by anticoagulation protocol: Yes   Plan:  - Coumadin 5 mg po daily - INR in am  Bayard Hugger, PharmD, BCPS  Clinical Pharmacist  Pager: [phone]   05/03/2015,2:00 PM

## 2015-05-03 NOTE — Progress Notes (Signed)
Physical Therapy Session Note  Patient Details  Name: [name] MRN: [id number] Date of Birth: 08/26/1955  Today's Date: 05/03/2015 PT Individual Time: 1500-1600 PT Individual Time Calculation (min): 60 min   Short Term Goals: Week 1:  PT Short Term Goal 1 (Week 1): Pt will initiate ambulation during PT session.  PT Short Term Goal 2 (Week 1): Pt will tolerate sitting up in w/c oob x 4 hours.  PT Short Term Goal 3 (Week 1): Pt will initiate stand-pivot bed to/from w/c transfers.  PT Short Term Goal 4 (Week 1): Pt will initiate stair training during PT session.  PT Short Term Goal 5 (Week 1): Pt will demonstrate 1 person assist scoot transfers with slideboard req mod cues for head hips relationship.   Skilled Therapeutic Interventions/Progress Updates:    Pt received in bed, agreeable to PT. Pt reports R leg pain feels better today, but continues to rate it at 8/10. Therapeutic Exercise - PT instructs pt in R LE rom and strengthening exercises, also for edema management through muscle pumping action: ankle pumps x 20, heel slides x 10, LAQ x 10, and supine hip abduction/adduction x 10 reps. Therapeutic Activity - see function tab for bed mobility, stand-pivot transfer bed to w/c, and scoot with slideboard transfer w/c to bed req min A and verbal cues for head-hips relationship. PT instructs pt in beginning squat-pivot transfers first step focus on leaning forward and pushing with B legs and R arm to achieve bottom clearance x 4 reps req mod A. Pt fearful, painful, and anxious about difficulty getting appropriate bottom clearance and is not yet ready for squat-pivot transfers; continue with slideboard transfers.  W/C Management - see function tab for details; pt req encouragement for this distance with R UE and L LE. Pt ended with all needs in reach, R LE elevated on pillows to assist in edema reduction. Continue per PT POC.   Therapy Documentation Precautions:  Precautions Precautions:  Fall Precaution Comments: no specific weight bearing order but avoided weight bearing through L wrist; wound to right LE and groin Restrictions Weight Bearing Restrictions: Yes Other Position/Activity Restrictions: see above Pain: Pain Assessment Pain Assessment: 0-10 Pain Score: 8  Pain Type: Acute pain;Neuropathic pain Pain Location: Knee Pain Orientation: Right Pain Descriptors / Indicators: Aching;Sore Pain Frequency: Intermittent Pain Onset: On-going Patients Stated Pain Goal: 3 Pain Intervention(s): Emotional support;Distraction;Rest Multiple Pain Sites: Yes 2nd Pain Site Pain Score: 8 Pain Type: Acute pain Pain Location: Wrist Pain Orientation: Left Pain Descriptors / Indicators: Aching;Sore Pain Onset: Sudden Pain Intervention(s): Rest;Emotional support   See Function Navigator for Current Functional Status.   Therapy/Group: Individual Therapy  Arnez Stoneking M 05/03/2015, 3:06 PM

## 2015-05-03 NOTE — IPOC Note (Signed)
Overall Plan of Care [facility]) Patient Details Name: [name] MRN: [id number] DOB: 1955/12/08  Admitting Diagnosis: Sepsis  Hospital Problems: Active Problems:   Chronic use of opiate drugs therapeutic purposes   Sepsis [facility])   Debility     Functional Problem List: Nursing Bladder, Edema, Endurance, Motor, Pain, Skin Integrity  PT Balance, Skin Integrity, Edema, Endurance, Motor, Pain, Safety, Sensory  OT Balance, Behavior, Edema, Endurance, Motor, Nutrition, Pain, Safety, Skin Integrity  SLP    TR         Basic ADL's: OT Grooming, Bathing, Dressing, Toileting     Advanced  ADL's: OT       Transfers: PT Bed Mobility, Bed to Chair, Car, Occupational psychologist, Research scientist (life sciences): PT Ambulation, Psychologist, prison and probation services, Stairs     Additional Impairments: OT Fuctional Use of Upper Extremity  SLP        TR      Anticipated Outcomes Item Anticipated Outcome  Self Feeding n/a  Swallowing      Basic self-care  supervision  Toileting  supervision   Bathroom Transfers supervision  Bowel/Bladder  Continent bowel and bladder, no retention once foley removed, no s/s infection.  Transfers  supervision  Locomotion  supervision gait and w/c mobility; min A stairs  Communication     Cognition     Pain  Managed at patient's goal of 3/10.  Safety/Judgment  Increased safety awareness, no falls, injury this admission.   Therapy Plan: PT Intensity: Minimum of 1-2 x/day ,45 to 90 minutes PT Frequency: 5 out of 7 days PT Duration Estimated Length of Stay: 3 weeks OT Intensity: Minimum of 1-2 x/day, 45 to 90 minutes OT Frequency: 5 out of 7 days OT Duration/Estimated Length of Stay: 3 weeks         Team Interventions: Nursing Interventions Patient/Family Education, Pain Management, Bladder Management, Disease Management/Prevention, Skin Care/Wound Management, Psychosocial Support  PT interventions Ambulation/gait training, Discharge planning, Functional  mobility training, Psychosocial support, Therapeutic Activities, Balance/vestibular training, Disease management/prevention, Neuromuscular re-education, Skin care/wound management, Therapeutic Exercise, Wheelchair propulsion/positioning, Cognitive remediation/compensation, DME/adaptive equipment instruction, Pain management, Splinting/orthotics, UE/LE Strength taining/ROM, Firefighter, Equities trader education, Museum/gallery curator, UE/LE Coordination activities  OT Interventions Warden/ranger, Cognitive remediation/compensation, Discharge planning, Community reintegration, Fish farm manager, Functional mobility training, Pain management, Psychosocial support, Skin care/wound managment, Self Care/advanced ADL retraining, Patient/family education, Therapeutic Activities, UE/LE Strength taining/ROM, UE/LE Coordination activities, Therapeutic Exercise, Wheelchair propulsion/positioning, Disease mangement/prevention  SLP Interventions    TR Interventions    SW/CM Interventions Discharge Planning, Psychosocial Support, Patient/Family Education    Team Discharge Planning: Destination: PT-Home ,OT- Home , SLP-  Projected Follow-up: PT-24 hour supervision/assistance, Home health PT, OT-  Outpatient OT, SLP-  Projected Equipment Needs: PT-To be determined, OT- To be determined, SLP-  Equipment Details: PT-Pt already owns RW and SPC, OT-  Patient/family involved in discharge planning: PT- Patient, Family member/caregiver,  OT-Patient, SLP-   MD ELOS: 18-21d Medical Rehab Prognosis:  Good Assessment: 59 y.o. right handed male history of hypertension, chronic knee pain, peptic ulcer disease, MRSA abscesses, peripheral neuropathy. Independent with occasional cane and walker prior to admission living with wife, in a home with facilities on the first floor and 4 steps to enter. Presented 04/07/2015 with recent fall 04/03/2015 with increasing left hand and wrist pain. In the ED  noted to be hypotensive tachycardia and white blood cell count of 31,300. Placed on broad-spectrum anti-biotic's. MRI of left hand show dorsal distal radius transverse fracture,  diffuse edema of the hand likely reactive to distal radius fracture, tear of the dorsal band of the scaphounate ligament. Multiple small deep fluid collections in the hands compatible with hematoma question superimposed infection. CT of right femur due to pain showed a 7.4 cm process involving or adjacent to the proximal right SFA question aneurysm, pseudoaneurysm or hematoma. 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 Imogene Burn    See Team Conference Notes for weekly updates to the plan of care

## 2015-05-04 ENCOUNTER — Inpatient Hospital Stay (HOSPITAL_COMMUNITY): Payer: Medicaid Other | Admitting: Physical Therapy

## 2015-05-04 DIAGNOSIS — L259 Unspecified contact dermatitis, unspecified cause: Secondary | ICD-10-CM

## 2015-05-04 LAB — PROTIME-INR
INR: 2.31 — AB (ref 0.00–1.49)
PROTHROMBIN TIME: 25.2 s — AB (ref 11.6–15.2)

## 2015-05-04 LAB — CBC
HEMATOCRIT: 29.4 % — AB (ref 39.0–52.0)
HEMOGLOBIN: 8.9 g/dL — AB (ref 13.0–17.0)
MCH: 26.6 pg (ref 26.0–34.0)
MCHC: 30.3 g/dL (ref 30.0–36.0)
MCV: 88 fL (ref 78.0–100.0)
Platelets: 417 10*3/uL — ABNORMAL HIGH (ref 150–400)
RBC: 3.34 MIL/uL — ABNORMAL LOW (ref 4.22–5.81)
RDW: 15.9 % — ABNORMAL HIGH (ref 11.5–15.5)
WBC: 9.3 10*3/uL (ref 4.0–10.5)

## 2015-05-04 MED ORDER — LORATADINE 10 MG PO TABS
10.0000 mg | ORAL_TABLET | Freq: Every day | ORAL | Status: DC
  Administered 2015-05-04 – 2015-05-07 (×4): 10 mg via ORAL
  Filled 2015-05-04 (×4): qty 1

## 2015-05-04 MED ORDER — TRIAMCINOLONE ACETONIDE 0.5 % EX CREA
TOPICAL_CREAM | Freq: Three times a day (TID) | CUTANEOUS | Status: DC
Start: 2015-05-04 — End: 2015-05-08
  Administered 2015-05-04 – 2015-05-07 (×9): via TOPICAL
  Filled 2015-05-04 (×2): qty 15

## 2015-05-04 MED ORDER — CLONIDINE HCL 0.1 MG PO TABS
0.1000 mg | ORAL_TABLET | ORAL | Status: DC | PRN
  Administered 2015-05-04: 0.1 mg via ORAL
  Filled 2015-05-04: qty 1

## 2015-05-04 MED ORDER — WARFARIN SODIUM 5 MG PO TABS
5.0000 mg | ORAL_TABLET | Freq: Once | ORAL | Status: AC
  Administered 2015-05-04: 5 mg via ORAL
  Filled 2015-05-04: qty 1

## 2015-05-04 NOTE — Consult Note (Signed)
WOC wound consult note Reason for Consult:Assistance with wound care. It is noted that my partner, D. Sylvie Farrier saw this patient on 04/29/15 and indicated that Orthopedics, specifically [name] was consulted on this patient on 9/26 and recommended conservative measures of watchful waiting and compression stockings. Additionally, he noted that topical dressings would not be effective for gangrene/eschar areas. Please contact [name] for any assistance needed in the care of this patient's right foot. WOC Nurse did not see. WOC nursing team will not follow, but will remain available to this patient, the nursing and medical teams.  Thanks, Ladona Mow, MSN, RN, GNP, [place], CWON-AP [phone])

## 2015-05-04 NOTE — Progress Notes (Signed)
Physical Therapy Session Note  Patient Details  Name: [name] MRN: [id number] Date of Birth: 11-22-1955  Today's Date: 05/04/2015 PT Individual Time: 0900-1000 PT Individual Time Calculation (min): 60 min   Short Term Goals: Week 1:  PT Short Term Goal 1 (Week 1): Pt will initiate ambulation during PT session.  PT Short Term Goal 2 (Week 1): Pt will tolerate sitting up in w/c oob x 4 hours.  PT Short Term Goal 3 (Week 1): Pt will initiate stand-pivot bed to/from w/c transfers.  PT Short Term Goal 4 (Week 1): Pt will initiate stair training during PT session.  PT Short Term Goal 5 (Week 1): Pt will demonstrate 1 person assist scoot transfers with slideboard req mod cues for head hips relationship.   Skilled Therapeutic Interventions/Progress Updates:    Pt received in bed - agreeable to PT session - consistently rating pain no less than 8/10 and groaning with movement/exertion throughout session, IV pole attached. Therapeutic Exercise - PT instructs pt in R LE ROM and strengthening exercises: ankle pumps x 20, partial arom heel slides, supine hip abduction/adduction x 10, slr aarom x 10, laq x 10, seated knee flexion eob x 10, ankle PF/DF closed chain eob x 10. PT places pt on Kinetron machine for 5 minutes at 20 cm/sec using B LEs while sitting in w/c for improved muscle activation in R LE with active assist from L. Therapeutic Activity - see function tab for bed mobility and transfers - pt is tolerating slight progressively lower sit to stands from eob bed height, but does so in an unconventional way with both UEs on PFRW. W/C Management - see function tab for details - pt req encouragement for this distance and uses B UEs - denying pain in L wrist. PT explains pt does not have specific NWB orders through L UE, but to be cautious with using it due to fracture. Pt reports no pain in L wrist with this activity. Gait Training - PT instructs pt in ascending/descending one 3" height step with B rails  req mod A: up with L (strong) leg, down backwards with R leg. Pt ended up in w/c in room with all needs in reach. Continue per PT POC.   Therapy Documentation Precautions:  Precautions Precautions: Fall Precaution Comments: no specific weight bearing order but avoided weight bearing through L wrist; wound to right LE and groin Restrictions Weight Bearing Restrictions: Yes Other Position/Activity Restrictions: see above Pain: Pain Assessment Pain Assessment: 0-10 Pain Score: 8  Pain Type: Acute pain Pain Location: Knee Pain Orientation: Right Pain Descriptors / Indicators: Sharp;Radiating (from knee to foot) Pain Frequency: Constant Pain Onset: On-going Pain Intervention(s): Rest;Emotional support;Distraction Multiple Pain Sites: Yes 2nd Pain Site Pain Score: 8 Pain Type: Acute pain Pain Location: Wrist Pain Orientation: Left Pain Descriptors / Indicators: Sharp Pain Onset: On-going Pain Intervention(s): Rest;Emotional support;Distraction  See Function Navigator for Current Functional Status.   Therapy/Group: Individual Therapy  Keryn Nessler M 05/04/2015, 9:08 AM

## 2015-05-04 NOTE — Progress Notes (Signed)
[name] is a 59 y.o. male 07-03-1956 [id number]  Subjective: C/o rash in L axilla after a new deodorant use. Per RN - gauze is stuck to R foot wound. Feeling OK.  Objective: Vital signs in last 24 hours: Temp:  [99.7 F (37.6 C)-100.1 F (37.8 C)] 99.7 F (37.6 C) (10/08 0533) Pulse Rate:  [101-102] 102 (10/08 0533) Resp:  [18] 18 (10/08 0533) BP: (138-170)/(63-71) 163/63 mmHg (10/08 0533) SpO2:  [96 %-98 %] 98 % (10/08 0533) Weight change:  Last BM Date: 05/02/15  Intake/Output from previous day: 10/07 0701 - 10/08 0700 In: 840 [P.O.:840] Out: 1600 [Urine:1600] Last cbgs: CBG (last 3)  No results for input(s): GLUCAP in the last 72 hours.   Physical Exam General: No apparent distress   HEENT: not dry Lungs: Normal effort. Lungs clear to auscultation, no crackles or wheezes. Cardiovascular: Regular rate and rhythm, no edema Abdomen: S/NT/ND; BS(+) Musculoskeletal:  unchanged Neurological: No new neurological deficits Wounds: R Toes w/gangrene - gauze is stuck Skin: paular rash in L axilla Mental state: Alert, oriented, cooperative    Lab Results: BMET    Component Value Date/Time   NA 134* 05/01/2015 0535   K 3.8 05/01/2015 0535   CL 99* 05/01/2015 0535   CO2 29 05/01/2015 0535   GLUCOSE 104* 05/01/2015 0535   BUN 6 05/01/2015 0535   CREATININE 0.91 05/01/2015 0535   CALCIUM 7.9* 05/01/2015 0535   GFRNONAA >60 05/01/2015 0535   GFRAA >60 05/01/2015 0535   CBC    Component Value Date/Time   WBC 8.9 05/01/2015 0535   RBC 2.85* 05/01/2015 0535   RBC 3.36* 04/08/2015 0848   HGB 7.9* 05/01/2015 0535   HCT 24.7* 05/01/2015 0535   PLT 333 05/01/2015 0535   MCV 86.7 05/01/2015 0535   MCH 27.7 05/01/2015 0535   MCHC 32.0 05/01/2015 0535   RDW 15.6* 05/01/2015 0535   LYMPHSABS 2.0 05/01/2015 0535   MONOABS 1.2* 05/01/2015 0535   EOSABS 0.2 05/01/2015 0535   BASOSABS 0.1 05/01/2015 0535    Studies/Results: No results found.  Medications: I have  reviewed the patient's current medications.  Assessment/Plan:  1. 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 DVT Prophylaxis/Anticoagulation: Coumadin for thromboembolism. Monitor for any bleeding episodes 3. Pain Management: MS Contin 90 mg twice a day, oxycodone for breakthrough pain. Monitor with increased mobility, at this point not much nerve pain in RLE 4. Mood: Effexor XR 75 mg daily, Klonopin 0.5 mg 4 times a day as needed 5. Neuropsych: This patient is capable of making decisions on his own behalf. 6. Skin/Wound Care: Skin care is supervised to right foot. 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. Wound consult. 7. Fluids/Electrolytes/Nutrition: Strict I and O's with follow-up chemistries 8.ID. Presently on Ancef 2 g 3 times a day. ID rec total 6 wks 9. Tachycardia SVT. Follow-up cardiology services. Continue Lopressor 50 mg twice a day for now, HR ok at 96 this am 10. Constipation. Laxitive assistance 11. Severe protein malnutrition, alb 1.6- supplement with beneprotein, enc hi protein diet choices 12. L axilla rash - probable contact dermatitis. D/c deodorant. Claritin. Steroid cream       Length of stay, days: 4  Sonda Primes , MD 05/04/2015, 8:28 AM

## 2015-05-04 NOTE — Progress Notes (Signed)
RN changed all three dressings this am. Left wrist has an order to pack wound, but wound does not need packing. It is a small would with a pink bed with pink skin surrounding wound. No drainage present. Kurlex placed then recovered with LUE splint. Right groin/upper thigh red, warm to touch, and +3 pitting edema noted. Wound appears red, with un approximated edges. Wound cleaned, and dressed with aquacel with silver alginate and covered with an ABD pad. Pt tolerated well. Right foot has no dressing change orders, but dressing was in place. I removed dressing, unstageable areas noted on foot. Right great toe, 2-3rd toe and pinky toe have necrotic areas. Inbetween great toe and second toe, large amount of slough noted with a moderate amount of serous drainage noted. Pt tolerated all dressing changed well. Pt did verbalize that he is getting discouraged. "I feel like I will never leave here." RN provided emotional support, offered chaplin services, and advised him to talk to RN, Techs, therapist when having discouraging thoughts. RN noted rash to bilateral axilla traveling to elbows. Pt said it itches a little bit when touched, but said he used a new deodorant last night that he has never used before. RN washed off remaining deodorant. Will monitor rash and clarify dressing changes with Doctor this am. Rudie Meyer, RN

## 2015-05-04 NOTE — Progress Notes (Signed)
ANTICOAGULATION CONSULT NOTE - Follow Up Consult  Pharmacy Consult for warfarin Indication: DVT  Allergies  Allergen Reactions  . Imitrex [Sumatriptan] Other (See Comments)    Chest pain, 2004 (tolerates 50 mg prn)  . Verapamil Other (See Comments)    Causes pvc's    Patient Measurements: Weight: 271 lb (122.925 kg)  Vital Signs: Temp: 99.7 F (37.6 C) (10/08 0533) Temp Source: Oral (10/08 0533) BP: 163/63 mmHg (10/08 0533) Pulse Rate: 102 (10/08 0533)  Labs:  Recent Labs  05/02/15 0825 05/03/15 0444 05/04/15 0546 05/04/15 0951  HGB  --   --   --  8.9*  HCT  --   --   --  29.4*  PLT  --   --   --  417*  LABPROT 25.1* 28.2* 25.2*  --   INR 2.30* 2.70* 2.31*  --     Estimated Creatinine Clearance: 126.6 mL/min (by C-G formula based on Cr of 0.91).   Medications:  Prescriptions prior to admission  Medication Sig Dispense Refill Last Dose  . albuterol (PROAIR HFA) 108 (90 BASE) MCG/ACT inhaler Inhale 1 puff into the lungs every 6 (six) hours as needed for wheezing or shortness of breath.    04/06/2015 at Unknown time  . atorvastatin (LIPITOR) 80 MG tablet Take 80 mg by mouth daily.   few days ago  . ceFAZolin (ANCEF) 2-3 GM-% SOLR Inject 50 mLs (2 g total) into the vein every 8 (eight) hours.     . clonazePAM (KLONOPIN) 0.5 MG tablet Take 4 tablets (2 mg total) by mouth 2 (two) times daily as needed for anxiety.     . colchicine 0.6 MG tablet Take 1 tablet (0.6 mg total) by mouth 2 (two) times daily. (Patient not taking: Reported on 04/07/2015) 14 tablet 0 Not Taking at Unknown time  . famciclovir (FAMVIR) 500 MG tablet Take 500 mg by mouth 2 (two) times daily as needed (fever blisters).    1-2 weeks ago  . Metoprolol Tartrate 75 MG TABS Take 75 mg by mouth 2 (two) times daily.     Marland Kitchen morphine (MS CONTIN) 30 MG 12 hr tablet Take 30 mg by mouth every 12 (twelve) hours. Take with a 60 mg tablet for a 90 mg dose  0 04/06/2015 at 900  . morphine (MS CONTIN) 60 MG 12 hr tablet  Take 60 mg by mouth every 12 (twelve) hours. Take with a 30 mg tablet for a 90 mg dose  0 04/06/2015 at 900  . naloxegol oxalate (MOVANTIK) 25 MG TABS tablet Take 25 mg by mouth 2 (two) times daily.   04/05/2015  . ondansetron (ZOFRAN) 8 MG tablet Take 8 mg by mouth daily as needed for nausea or vomiting.   0 04/06/2015 at Unknown time  . OVER THE COUNTER MEDICATION Place 1 drop into both eyes 2 (two) times daily as needed (dry eyes). OTC lubricant eye drops   2-3 weeks ago  . oxycodone (ROXICODONE) 30 MG immediate release tablet Take 30 mg by mouth every 6 (six) hours. scheduled   04/06/2015 at pm  . SUMAtriptan (IMITREX) 50 MG tablet Take 50 mg by mouth daily as needed for migraine or headache. Maximum 2 doses in 2 days  0 couple weeks ago  . trifluridine (VIROPTIC) 1 % ophthalmic solution Place 1 drop into both eyes 2 (two) times daily as needed (breakouts (blurry, matted eyelids)).   3 weeks ago  . venlafaxine XR (EFFEXOR-XR) 37.5 MG 24 hr capsule Take 37.5  mg by mouth daily with breakfast.   04/04/2015  . warfarin (COUMADIN) 5 MG tablet Take 1 tablet (5 mg total) by mouth daily at 6 PM.       Assessment: 59 yo male with DVT who is currently on therapeutic coumadin.  INR today is 2.31 > 2.7. Hgb 8.9, Plt 417. No bleeding noted per chart.  Goal of Therapy:  INR 2-3 Monitor platelets by anticoagulation protocol: Yes   Plan:  Warfarin 5 mg po x1 today Daily INR, CBC q72h Education completed 04-29-15  Arcola Jansky, PharmD Clinical Pharmacy Resident Pager: [phone] 05/04/2015,11:19 AM

## 2015-05-04 NOTE — Progress Notes (Signed)
Physical Therapy Session Note  Patient Details  Name: [name] MRN: [id number] Date of Birth: 01/09/1956  Today's Date: 05/04/2015 PT Individual Time: 4709-6283 and [id number] PT Individual Time Calculation (min): 87 min and 55 min   Short Term Goals: Week 1:  PT Short Term Goal 1 (Week 1): Pt will initiate ambulation during PT session.  PT Short Term Goal 2 (Week 1): Pt will tolerate sitting up in w/c oob x 4 hours.  PT Short Term Goal 3 (Week 1): Pt will initiate stand-pivot bed to/from w/c transfers.  PT Short Term Goal 4 (Week 1): Pt will initiate stair training during PT session.  PT Short Term Goal 5 (Week 1): Pt will demonstrate 1 person assist scoot transfers with slideboard req mod cues for head hips relationship.   Skilled Therapeutic Interventions/Progress Updates:    Session 1: Pt received in bed and agreeable to therapy session.  Pt transitioned to EOB with supervision and verbal cues for safety.  Pt stood with mod assist +1 and increased time with verbal cues for forward weight shift.  Pt amb x12' with mod assist and w/c follow with verbal cues for RW sequencing and RW placement to facilitate optimal use of UEs to offload RLE.  Pt propelled w/c x75' to therapy gym with RUE, LLE and transitioned to therapy mat with sliding board and supervision.  Pt performed BLE therex for strengthening, endurance, and muscle pump action, 2x10 ankle pumps, SAQ, and hip abd with manual resistance from therapist. Pt reports increased pain in lateral border of bilateral knees during manual resistance.  Pt performed partial sit ups from wedge 2x5 for core strength.  Pt participated in balance activity to increase forward weight shift and gluteal activation sorting golf balls between 2 containers.  Pt performed 10 reps of glute sets in sitting with no UE support.  Pt performed 7 mins on kinetron on 20 cm/s to increase knee flexion ROM, strength, and endurance.  Pt returned to room in w/c and positioned to  comfort in w/c with 2" step and 2 pieces of foam for support under R foot and call bell in reach and needs met.    Session 2: Pt received sitting in w/c and reporting significant fatigue but agreeable to therapy.  Pt propels w/c x50' +100' and negotiates 3% grade with min A from therapist.  Pt requesting to return to bed but agreeable to therex at bed level.  Pt performed slide board transfer with steady assist and assist for board placement.  Pt returned to supine with min assist for RLE and scooted to [facility] mod I.  PT instructed patient in 2x10 ankle pumps, quad sets, glute sets, heel slides, and TA activation.  Min verbal cues for LE exercise and mod verbal and tactile cues for TA activation.  PT provided hand out for LE exercise and instructed patient to perform 10 reps 2x/day for endurance, strengthening, and increased muscle pump action to reduce LE edema.  Noted pitting edema on LLE from just distal to knee to proximal of knee to lower 1/3 of thigh.  RN notified and TED hose adjusted to reduce risk of skin breakdown.  Pt positioned to comfort in supine at end of session and left with call bell in reach and needs met.   Therapy Documentation Precautions:  Precautions Precautions: Fall Precaution Comments: no specific weight bearing order but avoided weight bearing through L wrist; wound to right LE and groin Restrictions Weight Bearing Restrictions: Yes Other Position/Activity Restrictions: see  above Pain: Pain Assessment Pain Assessment: 0-10 Pain Score: 7  (pt satisfied with this relief) Pain Type: Acute pain Pain Location: Hip Pain Orientation: Right Pain Descriptors / Indicators: Sharp Pain Frequency: Constant Pain Onset: With Activity Pain Intervention(s): Medication (See eMAR)   See Function Navigator for Current Functional Status.   Therapy/Group: Individual Therapy  Earnest Conroy Penven-Crew 05/04/2015, 1:54 PM

## 2015-05-04 NOTE — Plan of Care (Signed)
Problem: RH PAIN MANAGEMENT Goal: RH STG PAIN MANAGED AT OR BELOW PT'S PAIN GOAL <4  Outcome: Not Progressing Pt rates pain an 8 or higher throughout shift

## 2015-05-04 NOTE — Progress Notes (Signed)
Dr. Posey Rea notified of patients axilla rash, and slough to right foot. No new orders received. Report given to oncoming RN to monitor today. Rudie Meyer, RN

## 2015-05-04 NOTE — Progress Notes (Signed)
On left crease of knee, new red streak - blanchable - appears to be along line of TED hose. Thigh high teds better? Swelling above knee, therapy applied ice. Will report to oncoming RN and continue to follow.  BP of 169/113 today. Md notified. 0.1 mg of Catapres given. 1 hour later, BP 151/81. Will continue to monitor and pass on to oncoming RN.

## 2015-05-05 ENCOUNTER — Inpatient Hospital Stay (HOSPITAL_COMMUNITY): Payer: 59 | Admitting: Occupational Therapy

## 2015-05-05 LAB — URINE CULTURE: CULTURE: NO GROWTH

## 2015-05-05 LAB — PROTIME-INR
INR: 2.06 — AB (ref 0.00–1.49)
PROTHROMBIN TIME: 23.1 s — AB (ref 11.6–15.2)

## 2015-05-05 MED ORDER — WARFARIN SODIUM 7.5 MG PO TABS
7.5000 mg | ORAL_TABLET | Freq: Once | ORAL | Status: AC
  Administered 2015-05-05: 7.5 mg via ORAL
  Filled 2015-05-05: qty 1

## 2015-05-05 MED ORDER — ALTEPLASE 100 MG IV SOLR
2.0000 mg | Freq: Once | INTRAVENOUS | Status: AC
  Administered 2015-05-05: 2 mg
  Filled 2015-05-05: qty 2

## 2015-05-05 MED ORDER — CEFAZOLIN SODIUM-DEXTROSE 2-3 GM-% IV SOLR
2.0000 g | Freq: Three times a day (TID) | INTRAVENOUS | Status: DC
  Administered 2015-05-05 – 2015-05-08 (×9): 2 g via INTRAVENOUS
  Filled 2015-05-05 (×14): qty 50

## 2015-05-05 NOTE — Plan of Care (Signed)
Problem: RH SKIN INTEGRITY Goal: RH STG SKIN FREE OF INFECTION/BREAKDOWN Pt will remain free of skin breakdown and infection while on rehab with min assist.  Outcome: Not Progressing R foot Goal: RH STG MAINTAIN SKIN INTEGRITY WITH ASSISTANCE STG Maintain Skin Integrity With max Assistance.  Outcome: Not Progressing R foot

## 2015-05-05 NOTE — Progress Notes (Signed)
Occupational Therapy Session Note  Patient Details  Name: [name] MRN: [id number] Date of Birth: 09-11-1955  Today's Date: 05/05/2015 OT Individual Time: 1300-1345 OT Individual Time Calculation (min): 45 min    Short Term Goals: Week 1:  OT Short Term Goal 1 (Week 1): Pt will transfer to [facility] with +1 max A  OT Short Term Goal 2 (Week 1): Pt will don shirt with setup OT Short Term Goal 3 (Week 1): Pt will thread LB clothing with mod A  OT Short Term Goal 4 (Week 1): Pt will perform bed mobility supine <>sit with min A with extra time OT Short Term Goal 5 (Week 1): Pt will perform sit to stand with max A in prep for clothing management   Skilled Therapeutic Interventions/Progress Updates:    Pt seen for OT therapy session focusing on functional transfers and activity tolerance. Pt in supine upon arrival, agreeable to tx session. He voiced 8/10 pain in R LE, medications provided prior to tx session. He voiced need to complete toileting task. Sliding board transfer completed from EOB > drop arm BSC. VCs and close supervision provided for placing of sliding board and for transfer. Pt sat on BSC for BM, demonstrating functional sitting balance and endurance. He stood at [facility] for hygiene and clothing management to be completed with total A as pt required B UE support for static standing balance. Pt educated regarding lateral leans for clothing management prior to toileting task and pt opted to stand for hygiene and to pull pants up. Pt declined sitting in w/c, opting instead to return to bed. Completed stand pivot transfer with PFRW back to bed with mod A and VCs for sequencing. Pt tolerated ~5 minutes of unsupported sitting at EOB. He completed sit<> stand from EOB with mod A and stood ~ 1-2 minutes with VCs for weightbearing through LEs vs UEs on PFRW. Pt returned to supine at end of session, left with all needs in reach. Pt educated throughout session regarding 3:1 hospital stay to recovery ratio,  benefits of OOB and mobility, energy conservation, lateral leans, activity progression, and d/c planning.   Therapy Documentation Precautions:  Precautions Precautions: Fall Precaution Comments: no specific weight bearing order but avoided weight bearing through L wrist; wound to right LE and groin Restrictions Weight Bearing Restrictions: Yes Other Position/Activity Restrictions: see above Pain: Pain Assessment Pain Score: 8  Pain Type: Acute pain Pain Location: Leg Pain Orientation: Right Pain Descriptors / Indicators: Sharp Pain Intervention(s): Ambulation/increased activity;Repositioned ADL: ADL ADL Comments: see Care tool function  See Function Navigator for Current Functional Status.   Therapy/Group: Individual Therapy  [name], Analie Katzman C 05/05/2015, 7:44 AM

## 2015-05-05 NOTE — Progress Notes (Signed)
WOC nurse reported patient in need of orthopedic surgeon consult with [name]. Dr. Posey Rea aware and will follow up on Monday. adm

## 2015-05-05 NOTE — Progress Notes (Signed)
Changed dressing to R foot. Black/brown spots to bottom of foot. Large yellow area between first and second toe, minimal serous drainage to gauze. Black necrotic areas on all toes (previous report spots on 4/5 toes - small black area on 4th toe noted). Large black spot to sides of foot. Discussed with MD this morning, recommended Pt wait to see [name]. Patient discouraged and anxious about foot, experiencing frequent pain in toes. Encouraged pt to ask questions of MD tomorrow. Will report to oncoming nurse.

## 2015-05-05 NOTE — Progress Notes (Signed)
[name] is a 59 y.o. male 1956/02/28 [id number]  Subjective: C/o chronic pain in R foot. F/u rash in L axilla after a new deodorant use - better. Per RN - gauze is stuck to R foot wound. Feeling OK.  Objective: Vital signs in last 24 hours: Temp:  [98.5 F (36.9 C)-98.7 F (37.1 C)] 98.5 F (36.9 C) (10/09 0600) Pulse Rate:  [89-106] 89 (10/09 0831) Resp:  [18-20] 20 (10/09 0600) BP: (145-169)/(58-113) 151/69 mmHg (10/09 0831) SpO2:  [97 %-98 %] 98 % (10/09 0600) Weight change:  Last BM Date: 05/04/15  Intake/Output from previous day: 10/08 0701 - 10/09 0700 In: 1080 [P.O.:1080] Out: 2800 [Urine:2800] Last cbgs: CBG (last 3)  No results for input(s): GLUCAP in the last 72 hours.   Physical Exam General: No apparent distress   HEENT: not dry Lungs: Normal effort. Lungs clear to auscultation, no crackles or wheezes. Cardiovascular: Regular rate and rhythm, no edema Abdomen: S/NT/ND; BS(+) Musculoskeletal:  unchanged Neurological: No new neurological deficits Wounds: R Toes w/gangrene - gauze is stuck, foot is swollen Skin: paular rash in L axilla - resolving Mental state: Alert, oriented, cooperative    Lab Results: BMET    Component Value Date/Time   NA 134* 05/01/2015 0535   K 3.8 05/01/2015 0535   CL 99* 05/01/2015 0535   CO2 29 05/01/2015 0535   GLUCOSE 104* 05/01/2015 0535   BUN 6 05/01/2015 0535   CREATININE 0.91 05/01/2015 0535   CALCIUM 7.9* 05/01/2015 0535   GFRNONAA >60 05/01/2015 0535   GFRAA >60 05/01/2015 0535   CBC    Component Value Date/Time   WBC 9.3 05/04/2015 0951   RBC 3.34* 05/04/2015 0951   RBC 3.36* 04/08/2015 0848   HGB 8.9* 05/04/2015 0951   HCT 29.4* 05/04/2015 0951   PLT 417* 05/04/2015 0951   MCV 88.0 05/04/2015 0951   MCH 26.6 05/04/2015 0951   MCHC 30.3 05/04/2015 0951   RDW 15.9* 05/04/2015 0951   LYMPHSABS 2.0 05/01/2015 0535   MONOABS 1.2* 05/01/2015 0535   EOSABS 0.2 05/01/2015 0535   BASOSABS 0.1 05/01/2015  0535    Studies/Results: No results found.  Medications: I have reviewed the patient's current medications.  Assessment/Plan:  1. 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 DVT Prophylaxis/Anticoagulation: Coumadin for thromboembolism. Monitor for any bleeding episodes 3. Pain Management: MS Contin 90 mg twice a day, oxycodone for breakthrough pain. Monitor with increased mobility, at this point not much nerve pain in RLE 4. Mood: Effexor XR 75 mg daily, Klonopin 0.5 mg 4 times a day as needed 5. Neuropsych: This patient is capable of making decisions on his own behalf. 6. Skin/Wound Care: Skin care is supervised to right foot. 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.  "Plan: I will order compressive stockings to help decrease the swelling in the right lower extremity to try to improve his microcirculation. I will follow-up as an outpatient. At this time I do not see indication for urgent surgical intervention and patient may be able to heal most of these gangrenous changes without surgical intervention. The biggest concern is great toe and I will follow this conservatively as well. I will place him in my medical compressive stockings once he is discharged from the hospital.  DUDA,[name]"    Wound consult: Editor: Link Snuffer, RN (  Registered Nurse)     Expand All Collapse All   "WOC wound consult note Reason for Consult:Assistance with wound care. It is noted that my partner, D. Sylvie Farrier saw this patient on 04/29/15 and indicated that Orthopedics, specifically [name] was consulted on this patient on 9/26 and recommended conservative measures of watchful waiting and compression stockings. Additionally, he noted that topical dressings would not be  effective for gangrene/eschar areas. Please contact [name] for any assistance needed in the care of this patient's right foot. WOC Nurse did not see. WOC nursing team will not follow, but will remain available to this patient, the nursing and medical teams.  Thanks, Ladona Mow, MSN, RN, GNP, [place], CWON-AP [phone])"     Would call Dr Lajoyce Corners on Mon if needed  7. Fluids/Electrolytes/Nutrition: Strict I and O's with follow-up chemistries 8.ID. Presently on Ancef 2 g 3 times a day. ID rec total 6 wks 9. Tachycardia SVT. Follow-up cardiology services. Continue Lopressor 50 mg twice a day for now, HR ok at 96 this am 10. Constipation. Laxitive assistance 11. Severe protein malnutrition, alb 1.6- supplement with beneprotein, enc hi protein diet choices 12. L axilla rash - probable contact dermatitis. D/c deodorant. Claritin. Steroid cream. Better - resolving.       Length of stay, days: 5  Sonda Primes , MD 05/05/2015, 8:42 AM

## 2015-05-05 NOTE — Progress Notes (Signed)
ANTICOAGULATION CONSULT NOTE - Follow Up Consult  Pharmacy Consult for warfarin Indication: DVT  Allergies  Allergen Reactions  . Imitrex [Sumatriptan] Other (See Comments)    Chest pain, 2004 (tolerates 50 mg prn)  . Verapamil Other (See Comments)    Causes pvc's    Patient Measurements: Weight: 271 lb (122.925 kg)  Vital Signs: Temp: 98.5 F (36.9 C) (10/09 0600) Temp Source: Oral (10/09 0600) BP: 151/69 mmHg (10/09 0831) Pulse Rate: 89 (10/09 0831)  Labs:  Recent Labs  05/03/15 0444 05/04/15 0546 05/04/15 0951 05/05/15 0607  HGB  --   --  8.9*  --   HCT  --   --  29.4*  --   PLT  --   --  417*  --   LABPROT 28.2* 25.2*  --  23.1*  INR 2.70* 2.31*  --  2.06*    Estimated Creatinine Clearance: 126.6 mL/min (by C-G formula based on Cr of 0.91).   Medications:  Prescriptions prior to admission  Medication Sig Dispense Refill Last Dose  . albuterol (PROAIR HFA) 108 (90 BASE) MCG/ACT inhaler Inhale 1 puff into the lungs every 6 (six) hours as needed for wheezing or shortness of breath.    04/06/2015 at Unknown time  . atorvastatin (LIPITOR) 80 MG tablet Take 80 mg by mouth daily.   few days ago  . ceFAZolin (ANCEF) 2-3 GM-% SOLR Inject 50 mLs (2 g total) into the vein every 8 (eight) hours.     . clonazePAM (KLONOPIN) 0.5 MG tablet Take 4 tablets (2 mg total) by mouth 2 (two) times daily as needed for anxiety.     . colchicine 0.6 MG tablet Take 1 tablet (0.6 mg total) by mouth 2 (two) times daily. (Patient not taking: Reported on 04/07/2015) 14 tablet 0 Not Taking at Unknown time  . famciclovir (FAMVIR) 500 MG tablet Take 500 mg by mouth 2 (two) times daily as needed (fever blisters).    1-2 weeks ago  . Metoprolol Tartrate 75 MG TABS Take 75 mg by mouth 2 (two) times daily.     Marland Kitchen morphine (MS CONTIN) 30 MG 12 hr tablet Take 30 mg by mouth every 12 (twelve) hours. Take with a 60 mg tablet for a 90 mg dose  0 04/06/2015 at 900  . morphine (MS CONTIN) 60 MG 12 hr tablet  Take 60 mg by mouth every 12 (twelve) hours. Take with a 30 mg tablet for a 90 mg dose  0 04/06/2015 at 900  . naloxegol oxalate (MOVANTIK) 25 MG TABS tablet Take 25 mg by mouth 2 (two) times daily.   04/05/2015  . ondansetron (ZOFRAN) 8 MG tablet Take 8 mg by mouth daily as needed for nausea or vomiting.   0 04/06/2015 at Unknown time  . OVER THE COUNTER MEDICATION Place 1 drop into both eyes 2 (two) times daily as needed (dry eyes). OTC lubricant eye drops   2-3 weeks ago  . oxycodone (ROXICODONE) 30 MG immediate release tablet Take 30 mg by mouth every 6 (six) hours. scheduled   04/06/2015 at pm  . SUMAtriptan (IMITREX) 50 MG tablet Take 50 mg by mouth daily as needed for migraine or headache. Maximum 2 doses in 2 days  0 couple weeks ago  . trifluridine (VIROPTIC) 1 % ophthalmic solution Place 1 drop into both eyes 2 (two) times daily as needed (breakouts (blurry, matted eyelids)).   3 weeks ago  . venlafaxine XR (EFFEXOR-XR) 37.5 MG 24 hr capsule Take 37.5  mg by mouth daily with breakfast.   04/04/2015  . warfarin (COUMADIN) 5 MG tablet Take 1 tablet (5 mg total) by mouth daily at 6 PM.       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  INR today is 2.06 > 2.31 > 2.7. Hgb 8.9, Plt 417. No bleeding noted per chart.  Goal of Therapy:  INR 2-3 Monitor platelets by anticoagulation protocol: Yes   Plan:  Warfarin 7.5 mg po x1 today Daily INR, CBC q72h (next on 10/11), signs/symptoms of bleeding Education completed 04-29-15  Arcola Jansky, PharmD Clinical Pharmacy Resident Pager: [phone] 05/05/2015,11:11 AM

## 2015-05-06 ENCOUNTER — Inpatient Hospital Stay (HOSPITAL_COMMUNITY): Payer: Medicaid Other | Admitting: Physical Therapy

## 2015-05-06 ENCOUNTER — Inpatient Hospital Stay (HOSPITAL_COMMUNITY): Payer: 59 | Admitting: Occupational Therapy

## 2015-05-06 ENCOUNTER — Inpatient Hospital Stay (HOSPITAL_COMMUNITY): Payer: Medicaid Other

## 2015-05-06 ENCOUNTER — Encounter (HOSPITAL_COMMUNITY): Payer: 59

## 2015-05-06 DIAGNOSIS — R5381 Other malaise: Principal | ICD-10-CM

## 2015-05-06 DIAGNOSIS — F4321 Adjustment disorder with depressed mood: Secondary | ICD-10-CM | POA: Insufficient documentation

## 2015-05-06 DIAGNOSIS — Z79899 Other long term (current) drug therapy: Secondary | ICD-10-CM

## 2015-05-06 LAB — PROTIME-INR
INR: 1.84 — AB (ref 0.00–1.49)
PROTHROMBIN TIME: 21.2 s — AB (ref 11.6–15.2)

## 2015-05-06 MED ORDER — SENNOSIDES-DOCUSATE SODIUM 8.6-50 MG PO TABS
2.0000 | ORAL_TABLET | Freq: Two times a day (BID) | ORAL | Status: DC
  Administered 2015-05-06 – 2015-05-07 (×4): 2 via ORAL
  Filled 2015-05-06 (×4): qty 2

## 2015-05-06 MED ORDER — WARFARIN SODIUM 7.5 MG PO TABS
7.5000 mg | ORAL_TABLET | Freq: Once | ORAL | Status: AC
  Administered 2015-05-06: 7.5 mg via ORAL
  Filled 2015-05-06: qty 1

## 2015-05-06 NOTE — Progress Notes (Signed)
Occupational Therapy Session Note  Patient Details  Name: [name] MRN: [id number] Date of Birth: June 21, 1956  Today's Date: 05/06/2015 OT Individual Time: 4098-1191 OT Individual Time Calculation (min): 30 min   Short Term Goals: Week 1:  OT Short Term Goal 1 (Week 1): Pt will transfer to [facility] with +1 max A  OT Short Term Goal 2 (Week 1): Pt will don shirt with setup OT Short Term Goal 3 (Week 1): Pt will thread LB clothing with mod A  OT Short Term Goal 4 (Week 1): Pt will perform bed mobility supine <>sit with min A with extra time OT Short Term Goal 5 (Week 1): Pt will perform sit to stand with max A in prep for clothing management   Skilled Therapeutic Interventions/Progress Updates:  Patient received supine in bed with LUE elevated and RLE elevated. Pt stated he did not feel like getting out of bed, pt stated he was still anxious from a near fall this morning with therapy. Engaged pt in LUE ROM strengthening exercises (finger flexion/extension, elbow flexion/extension, shoulder flexion/extension) to also work on decreasing edema in hand and arm. Then engaged pt in RUE ROM exercise using blue theraband (shoulder flexion/extension, tricep extension, latissimus dorsi exercises). Encouraged pt to complete 10each and at least 3x per day. At end of session, left pt supine in bed with LUE elevated on 3 pillows and all needs within reach.   Therapy Documentation Precautions:  Precautions Precautions: Fall Precaution Comments: no specific weight bearing order but avoided weight bearing through L wrist; wound to right LE and groin Restrictions Weight Bearing Restrictions: Yes Other Position/Activity Restrictions: see above  Pain: Pain Assessment Pain Assessment: 0-10 Pain Score: 0-No pain Pain Type: Acute pain Pain Location: Leg Pain Descriptors / Indicators: Aching Pain Onset: On-going Pain Intervention(s): Medication (See eMAR)   See Function Navigator for Current Functional  Status.  Therapy/Group: Individual Therapy  Elidia Bonenfant , MS, OTR/L, CLT Pager: [phone]  05/06/2015, 6:22 PM

## 2015-05-06 NOTE — Consult Note (Signed)
INITIAL DIAGNOSTIC EVALUATION - [facility]   MEDICAL NECESSITY:  Mr. Emir Nack was seen on the [facility] Unit for an initial diagnostic evaluation owing to the patient's diagnosis of debility, chronic pain, and depression.    According to medical records, Mr. 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 Records also indicate that he is a "59 y.o. right handed male history of hypertension, chronic knee pain, peptic ulcer disease, MRSA abscesses, peripheral neuropathy. Independent with occasional cane and walker prior to admission living with wife.Presented 04/07/2015 with recent fall 04/03/2015 with increasing left hand and wrist pain. In the ED noted to be hypotensive, tachycardia and white blood cell count of 31,300. Placed on broad-spectrum antibiotics. MRI of left hand show dorsal distal radius transverse fracture, diffuse edema of the hand likely reactive to distal radius fracture, tear of the dorsal band of the scaphounate ligament. Multiple small deep fluid collections in the hands compatible with hematoma question superimposed infection. CT of right femur due to pain showed a 7.4 cm process involving or adjacent to the proximal right SFA question aneurysm, pseudoaneurysm or hematoma."  During today's visit, Mr. Rebstock was not overly concerned about his cognition. He said that his wife has noticed that he is not as sharp as he used to be and he wonders about his memory but not to a significant degree.   From an emotional standpoint, Mr. Schueller is a chronic pain patient of several decades following knee surgeries.  Owing to this he suffers bouts of depression and anxiety. He has been treated for this via an antidepressant and an anxiolytic for several years, which he finds to be  somewhat helpful. Just before this hospitalization he had planned to have an initial consultation with a psychiatrist (Dr. Sandria Manly). He plans to reinitiate this visit upon discharge. He has never participated in psychotherapy but he is interested in it. With regard to this admission, he has been "frustrated" lately and continues to experience mild depression that resolves on its own. Most of his longstanding depression stems from loss of functioning secondary to chronic pain (e.g., inability to work; inability to participate in previously enjoyed activities).  No adjustment issues endorsed. Suicidal/homicidal ideation, plan or intent was denied. No manic or hypomanic episodes were reported. The patient denied ever experiencing any auditory/visual hallucinations. No major behavioral or personality changes were endorsed.   Patient feels that progress is being made in therapy. No barriers to therapy identified. Mr. Renfroe described the rehab staff as "very good." He has been unable to work so he is in the process of applying of disability benefits. He described having ample social support via his wife. He said they also keep their 41-year-old grandson.   PROCEDURES: [1 unit 90791] Diagnostic clinical interview  Review of available records   IMPRESSION: Overall, Mr. Meetze denied experiencing any major cognitive symptoms and no overt cognitive impairment was noted. Emotionally, he suffers from longstanding bouts of depression and anxiety and he described frustration with his chronic issues and the loss of functioning that comes along with it. He has taken the initiative to schedule a consult with a psychiatrist to optimize medication management of depression and anxiety, and I encouraged him to keep this appointment. He and I also discussed the benefits of psychotherapy and participation in support groups. He is interested in both and we will supply him with resources  to utilize upon discharge. In the meantime, I plan to  follow-up with him regularly throughout this admission to assist with mood management and to provide support. I will ask social work to place him on my schedule for next week.     Debbe Mounts, Psy.D.  Clinical Neuropsychologist

## 2015-05-06 NOTE — Progress Notes (Signed)
Occupational Therapy Session Note  Patient Details  Name: [name] MRN: [id number] Date of Birth: [birth year]  Today's Date: 05/06/2015 OT Individual Time: 1000-1100 OT Individual Time Calculation (min): 60 min    Short Term Goals: Week 1:  OT Short Term Goal 1 (Week 1): Pt will transfer to [facility] with +1 max A  OT Short Term Goal 2 (Week 1): Pt will don shirt with setup OT Short Term Goal 3 (Week 1): Pt will thread LB clothing with mod A  OT Short Term Goal 4 (Week 1): Pt will perform bed mobility supine <>sit with min A with extra time OT Short Term Goal 5 (Week 1): Pt will perform sit to stand with max A in prep for clothing management   Skilled Therapeutic Interventions/Progress Updates: Therapeutic activity with focus on pain management at BUE, emphasizing upper back/shoulder girlde to include: trapezius, deltoids, rhomboids, subscapularis.   Pt assessed for progressive pain/fatigue at upper back.  AROM wfl, no evidence of trauma, positive for history of B-RTC repair, and reporting awareness of exertion from supported standing at RW with NWB restriction at left wrist.   Pt agreeable to use of heat packs (15 min), massage, and gentle joint mobilization of bilateral shoulder capsules.   Pt reports decrease of symptoms and is escorted back to his room to complete squat-pivot transfer to bed with overall mod assist for transfer (pt=80%) and supervision for bed mobility from sitting to supine.   Pt left with call light and phone within reach.     Therapy Documentation Precautions:  Precautions Precautions: Fall Precaution Comments: no specific weight bearing order but avoided weight bearing through L wrist; wound to right LE and groin Restrictions Weight Bearing Restrictions: Yes Other Position/Activity Restrictions: see above  Vital Signs: Therapy Vitals Temp: 98.9 F (37.2 C) Temp Source: Oral Pulse Rate: 97 Resp: 16 BP: (!) 160/64 mmHg Patient Position (if appropriate):  Lying Oxygen Therapy SpO2: 98 % O2 Device: Not Delivered   Pain: Pain Assessment Pain Score: 6  Pain Type: Acute pain Pain Location: Foot Pain Orientation: Right Pain Descriptors / Indicators: Throbbing Pain Onset: On-going Pain Intervention(s):  (premedicated)  See Function Navigator for Current Functional Status.   Therapy/Group: Individual Therapy   Second session: Time: 1300-1400 Time Calculation (min):  60 min  Pain Assessment: 6/10, RLE  Skilled Therapeutic Interventions: ADL-retraining, bed level with focus on edema control at left wrist, adapted bathing/dressing, skin care, AE re-ed (reacher, LH sponge).   Pt performs upper body bathing after setup with min assist to manage LUE.   OT instructs pt on edema control using retrograde massage, elevation, and finger AROM (six pack exercises).   Pt instructed on skin care peri-area to include inspection of catheter and cleaning secretions from tubing.   OT provides assist with bed mobility and side lying to allow inspection of skin and assist with washing buttocks.   OT notes maceration of skin at and around scrotum and alerts RN to need for continued care.   RN arrives an provides expedient care using anti-fungal powder.   Pt left in bed at end of session without lower body clothing to enhance healing of skin irration.   See FIM for current functional status  Therapy/Group: Individual Therapy  Filipe Greathouse 05/06/2015, 3:20 PM

## 2015-05-06 NOTE — Progress Notes (Signed)
Physical Therapy Session Note  Patient Details  Name: [name] MRN: [id number] Date of Birth: [birth year]  Today's Date: 05/06/2015 PT Individual Time: 0830-0930 PT Individual Time Calculation (min): 60 min   Short Term Goals: Week 1:  PT Short Term Goal 1 (Week 1): Pt will initiate ambulation during PT session.  PT Short Term Goal 1 - Progress (Week 1): Met PT Short Term Goal 2 (Week 1): Pt will tolerate sitting up in w/c oob x 4 hours.  PT Short Term Goal 2 - Progress (Week 1): Progressing toward goal PT Short Term Goal 3 (Week 1): Pt will initiate stand-pivot bed to/from w/c transfers.  PT Short Term Goal 3 - Progress (Week 1): Met PT Short Term Goal 4 (Week 1): Pt will initiate stair training during PT session.  PT Short Term Goal 4 - Progress (Week 1): Met PT Short Term Goal 5 (Week 1): Pt will demonstrate 1 person assist scoot transfers with slideboard req mod cues for head hips relationship.  PT Short Term Goal 5 - Progress (Week 1): Met  Skilled Therapeutic Interventions/Progress Updates:   Pt received in bed reporting that he is awaiting visit from Dr Sharol Given re: R foot but agreeable to therapy.  Pt reporting L wrist splint being tighter; assessed fit of hand splint due to increased edema-able to fit two fingers underneath comfortably.  Discussed safety with R foot and discussed wearing shoe to protect toes due to tight gastroc and difficulty WB through heel.  Pt does have bed room shoes in room but would be too tight on R foot.  Requested wife to bring in pair of bigger shoes to try.  Will also have RN clarify with MD about WB status through R foot.  Pt transferred to EOB with supervision with [facility] raised and bed rails.  Donned shoe on L foot and sock on R foot.  Pt transferred sit > stand from EOB with mod A with bed rails and ambulated 10' to sink with mod A and verbal cues for WB through UE to minimize WB through R toes.  At sink pt stood with min-mod A for balance to brush teeth  with cues to maintain weight on LLE and L elbow.  When stepping backwards to sit in w/c pt experienced full LOB; caught by PT and returned to standing with max-total A but pt noted to have skin tear on L knee from hitting RW. RN notified who examined and cleaned and dressed.  Continued to gym in w/c with total A.    Discussed home set up re: entry and placement of bed/bathroom.  Pt reports 4-5 STE with 2 rails and then full flight to second level bed/bath.  Discussed with pt the challenge of hopping up multiple steps on LLE, L elbow and RUE while keeping weight off of RLE; demonstrated method to pt of leaning on L elbow and maintaining weight through UE to hop LLE up onto step.  Pt return demonstrated on 2" step forwards to ascend, backwards to descend with mod A but poor ability to keep weight off of R toes.  Returned to sitting and discussed possibility of building a ramp for home entry/exit and setting up a bedroom downstairs due to weakness in LLE and difficulty with un weighting RLE.  Pt reporting that his father could install a ramp.    Following stair negotiation RN reporting PA recommending NWB RLE until evaluated by surgeon.    Performed stand pivot transfer training with focus on  maintaining NWB RLE: performed stand pivot with PFRW w/c <> mat with mod-max A overall with verbal cues to keep RLE elevated off floor and pivot or hop with LLE to transfer.  Pt able to maintain NWB but LLE fatigued quickly and pt reporting soreness in shoulders.  Returned to room in w/c total A and pt set up with all items within reach to await OT.  OT alerted to changes in WB status and recommendations for home.       Strongly recommending ramp for safety for home entry/exit and bedroom with hospital bed set up on main level.  Goals may need to be downgraded for safety following assessment and recommendations given by surgeon regarding R foot.    Therapy Documentation Precautions:  Precautions Precautions:  Fall Precaution Comments: no specific weight bearing order but avoided weight bearing through L wrist; wound to right LE and groin Restrictions Weight Bearing Restrictions: Yes Other Position/Activity Restrictions: see above Pain: Pain Assessment Pain Score: 6  Pain Type: Acute pain Pain Location: Foot Pain Orientation: Right Pain Descriptors / Indicators: Throbbing Pain Onset: On-going Pain Intervention(s):  (premedicated)  See Function Navigator for Current Functional Status.   Therapy/Group: Individual Therapy  [name] 05/06/2015, 12:26 PM

## 2015-05-06 NOTE — Progress Notes (Signed)
Subjective/Complaints: Wrist pain, come tingling in fingers  No SOB or CP    Objective: Vital Signs: Blood pressure 169/77, pulse 91, temperature 98.7 F (37.1 C), temperature source Oral, resp. rate 18, weight 122.925 kg (271 lb), SpO2 97 %. No results found. Results for orders placed or performed during the hospital encounter of 04/30/15 (from the past 72 hour(s))  Urine culture     Status: None   Collection Time: 05/03/15  6:10 PM  Result Value Ref Range   Specimen Description URINE, RANDOM    Special Requests NONE    Culture NO GROWTH 2 DAYS    Report Status 05/05/2015 FINAL   Urinalysis, Routine w reflex microscopic (not at [facility])     Status: Abnormal   Collection Time: 05/03/15  6:12 PM  Result Value Ref Range   Color, Urine YELLOW YELLOW   APPearance CLEAR CLEAR   Specific Gravity, Urine 1.015 1.005 - 1.030   pH 7.0 5.0 - 8.0   Glucose, UA NEGATIVE NEGATIVE mg/dL   Hgb urine dipstick LARGE (A) NEGATIVE   Bilirubin Urine NEGATIVE NEGATIVE   Ketones, ur NEGATIVE NEGATIVE mg/dL   Protein, ur 30 (A) NEGATIVE mg/dL   Urobilinogen, UA 1.0 0.0 - 1.0 mg/dL   Nitrite NEGATIVE NEGATIVE   Leukocytes, UA NEGATIVE NEGATIVE  Urine microscopic-add on     Status: None   Collection Time: 05/03/15  6:12 PM  Result Value Ref Range   Squamous Epithelial / LPF RARE RARE   WBC, UA 3-6 <3 WBC/hpf   RBC / HPF 21-50 <3 RBC/hpf   Bacteria, UA RARE RARE  Protime-INR     Status: Abnormal   Collection Time: 05/04/15  5:46 AM  Result Value Ref Range   Prothrombin Time 25.2 (H) 11.6 - 15.2 seconds   INR 2.31 (H) 0.00 - 1.49  CBC     Status: Abnormal   Collection Time: 05/04/15  9:51 AM  Result Value Ref Range   WBC 9.3 4.0 - 10.5 K/uL   RBC 3.34 (L) 4.22 - 5.81 MIL/uL   Hemoglobin 8.9 (L) 13.0 - 17.0 g/dL   HCT 47.8 (L) 29.5 - 62.1 %   MCV 88.0 78.0 - 100.0 fL   MCH 26.6 26.0 - 34.0 pg   MCHC 30.3 30.0 - 36.0 g/dL   RDW 30.8 (H) 65.7 - 84.6 %   Platelets 417 (H) 150 - 400 K/uL   Protime-INR     Status: Abnormal   Collection Time: 05/05/15  6:07 AM  Result Value Ref Range   Prothrombin Time 23.1 (H) 11.6 - 15.2 seconds   INR 2.06 (H) 0.00 - 1.49  Protime-INR     Status: Abnormal   Collection Time: 05/06/15  5:17 AM  Result Value Ref Range   Prothrombin Time 21.2 (H) 11.6 - 15.2 seconds   INR 1.84 (H) 0.00 - 1.49     HEENT: normal Cardio: RRR and no murmur Resp: CTA B/L and unlabored GI: BS positive and NT, ND Extremity:  Pulses positive and No Edema Skin:   Breakdown Right foot with erythema and small areas of black eschar on great toe and Wound midline abd incision healing well staples removed Neuro: Alert/Oriented, Anxious, Abnormal Sensory reduced sensation to LT in left fingers and Right medial and ant thigh and right medial leg   Abnormal Motor 4- R hip add, 5/5 left Hip add  4/5 left  Knee ext , 2- RIght Knee ext , 3- Left hand grip, 3- Right ankle ,  4- Left ankle , RUE normal Musc/Skel:  Other healed incision bilateral knee Gen NAD except complained of fatigue with minmal activity   Assessment/Plan: 1. 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 Physiatrist is providing close team supervision and 24 hour management of active medical problems listed below. Physiatrist and rehab team continue to assess barriers to discharge/monitor patient progress toward functional and medical goals.  We discussed that the femoral neuropathy is partial but recovery will take months, may need outpt EMG  FIM: Function - Bathing Position: Sitting EOB Body parts bathed by patient: Chest, Abdomen, Left arm Body parts bathed by helper: Right arm, Back Bathing not applicable: Buttocks, Right upper leg, Left upper leg,  Front perineal area, Right lower leg, Left lower leg (UB bathing only today) Assist Level: Touching or steadying assistance(Pt > 75%)  Function- Upper Body Dressing/Undressing What is the patient wearing?: Hospital gown Assist Level: Touching or steadying assistance(Pt > 75%) Function - Lower Body Dressing/Undressing What is the patient wearing?: Underwear, Pants, Ted Hose, Non-skid slipper socks Underwear - Performed by helper: Thread/unthread right underwear leg, Thread/unthread left underwear leg, Pull underwear up/down Pants- Performed by helper: Thread/unthread right pants leg, Thread/unthread left pants leg, Pull pants up/down Non-skid slipper socks- Performed by helper: Don/doff right sock (only wearing one sock (on the right)) TED Hose - Performed by helper: Don/doff left TED hose (only one TED hose- on the left) Assist for footwear: Maximal assist Assist for lower body dressing: Touching or steadying assistance (Pt > 75%)  Function - Toileting Toileting steps completed by helper: Adjust clothing prior to toileting, Performs perineal hygiene, Adjust clothing after toileting Toileting Assistive Devices: Grab bar or rail Medical Problem List and Plan: 1. 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  DVT Prophylaxis/Anticoagulation: Coumadin for thromboembolism. Monitor for any bleeding episodes 3. Pain Management: MS Contin 90 mg twice a day, oxycodone for breakthrough pain. Monitor with increased mobility, at this point not much nerve pain in RLE 4. Mood: Effexor XR 75 mg daily, Klonopin 0.5 mg 4 times a day as needed 5. Neuropsych: This patient is capable of making decisions on his own behalf. 6. Skin/Wound Care: Skin care is supervised to right foot. 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 7. Fluids/Electrolytes/Nutrition: Strict I and O's with follow-up chemistries 8.ID. Presently on Ancef 2 g 3 times a day. ID rec total 6 wks, UCx showed no growth so no changes needed 9. Tachycardia SVT. Follow-up cardiology services. Continue Lopressor 50 mg twice a day for now, HR ok at 91 this am 10. Constipation. Laxitive  assistance, last BM 2 days ago, adjust meds 11.  Severe protein malnutrition, alb 1.6- supplement with beneprotein, enc hi protein diet choices Function - Toilet Transfers Toilet transfer assistive device: Drop arm commode, Sliding board, Walker Assist level to toilet:  (Sliding board to toilet) Assist level from toilet:  (Stand pivot PFRW) Assist level to bedside commode (at bedside): Touching or steadying assistance (Pt > 75%) (Sliding Board) Assist level from bedside commode (at bedside): Moderate assist (Pt 50 - 74%/lift or lower) (Stand pivot with PFRW)  Function - Chair/bed transfer Chair/bed transfer method: Lateral scoot Chair/bed transfer assist  level: Touching or steadying assistance (Pt > 75%) Chair/bed transfer assistive device: Sliding board Chair/bed transfer details: Manual facilitation for weight shifting, Manual facilitation for placement, Verbal cues for sequencing, Verbal cues for technique, Verbal cues for precautions/safety, Verbal cues for safe use of DME/AE  Function - Locomotion: Wheelchair Will patient use wheelchair at discharge?: Yes Type: Manual Max wheelchair distance: 100 Assist Level: Supervision or verbal cues Wheel 50 feet with 2 turns activity did not occur: Safety/medical concerns Assist Level: Supervision or verbal cues Wheel 150 feet activity did not occur: Safety/medical concerns Assist Level: Supervision or verbal cues Turns around,maneuvers to table,bed, and toilet,negotiates 3% grade,maneuvers on rugs and over doorsills: No (min A for 3% grade) Function - Locomotion: Ambulation Assistive device:  Walker-rolling Max distance: 12 Assist level: Moderate assist (Pt 50 - 74%) Walk 10 feet activity did not occur: Safety/medical concerns Assist level: Moderate assist (Pt 50 - 74%) Walk 50 feet with 2 turns activity did not occur: Safety/medical concerns Walk 150 feet activity did not occur: Safety/medical concerns Walk 10 feet on uneven surfaces activity did not occur: Safety/medical concerns  Function - Comprehension Comprehension: Auditory Comprehension assist level: Follows complex conversation/direction with extra time/assistive device  Function - Expression Expression: Verbal Expression assist level: Expresses complex ideas: With extra time/assistive device  Function - Social Interaction Social Interaction assist level: Interacts appropriately with others with medication or extra time (anti-anxiety, antidepressant).  Function - Problem Solving Problem solving assist level: Solves basic problems with no assist  Function - Memory Memory assist level: Recognizes or recalls 90% of the time/requires cueing < 10% of the time Patient normally able to recall (first 3 days only): Current season, Location of own room, Staff names and faces, That he or she is in a hospital    LOS (Days) 6 A FACE TO FACE EVALUATION WAS PERFORMED  Abhiraj Dozal E 05/06/2015, 8:08 AM

## 2015-05-06 NOTE — Progress Notes (Signed)
ANTICOAGULATION CONSULT NOTE - Follow Up Consult  Pharmacy Consult for warfarin Indication: DVT  Allergies  Allergen Reactions  . Imitrex [Sumatriptan] Other (See Comments)    Chest pain, 2004 (tolerates 50 mg prn)  . Verapamil Other (See Comments)    Causes pvc's    Patient Measurements: Weight: 271 lb (122.925 kg)  Vital Signs: Temp: 98.7 F (37.1 C) (10/10 0433) Temp Source: Oral (10/10 0433) BP: 169/77 mmHg (10/10 0433) Pulse Rate: 91 (10/10 0433)  Labs:  Recent Labs  05/04/15 0546 05/04/15 0951 05/05/15 0607 05/06/15 0517  HGB  --  8.9*  --   --   HCT  --  29.4*  --   --   PLT  --  417*  --   --   LABPROT 25.2*  --  23.1* 21.2*  INR 2.31*  --  2.06* 1.84*    Estimated Creatinine Clearance: 126.6 mL/min (by C-G formula based on Cr of 0.91).  Assessment: 59 yo male with DVT who is currently on warfarin.INR 1.84 today. No bleeding noted per chart.  Goal of Therapy:  INR 2-3 Monitor platelets by anticoagulation protocol: Yes   Plan:  Warfarin 7.5 mg po x1 today Daily INR, CBC q72h (next on 10/11), signs/symptoms of bleeding Education completed 04-29-15  Bayard Hugger, PharmD, BCPS  Clinical Pharmacist  Pager: [phone]   05/06/2015,1:15 PM

## 2015-05-07 ENCOUNTER — Inpatient Hospital Stay (HOSPITAL_COMMUNITY): Payer: Medicaid Other | Admitting: Physical Therapy

## 2015-05-07 ENCOUNTER — Inpatient Hospital Stay (HOSPITAL_COMMUNITY): Payer: Medicaid Other

## 2015-05-07 ENCOUNTER — Inpatient Hospital Stay (HOSPITAL_COMMUNITY): Payer: Medicaid Other | Admitting: Occupational Therapy

## 2015-05-07 ENCOUNTER — Inpatient Hospital Stay (HOSPITAL_COMMUNITY): Payer: Medicaid Other | Admitting: *Deleted

## 2015-05-07 DIAGNOSIS — G5612 Other lesions of median nerve, left upper limb: Secondary | ICD-10-CM | POA: Diagnosis present

## 2015-05-07 LAB — CBC
HEMATOCRIT: 26.4 % — AB (ref 39.0–52.0)
Hemoglobin: 8.1 g/dL — ABNORMAL LOW (ref 13.0–17.0)
MCH: 26.6 pg (ref 26.0–34.0)
MCHC: 30.7 g/dL (ref 30.0–36.0)
MCV: 86.6 fL (ref 78.0–100.0)
Platelets: 356 10*3/uL (ref 150–400)
RBC: 3.05 MIL/uL — ABNORMAL LOW (ref 4.22–5.81)
RDW: 16.1 % — AB (ref 11.5–15.5)
WBC: 7.4 10*3/uL (ref 4.0–10.5)

## 2015-05-07 LAB — PROTIME-INR
INR: 2.04 — AB (ref 0.00–1.49)
PROTHROMBIN TIME: 22.9 s — AB (ref 11.6–15.2)

## 2015-05-07 MED ORDER — WARFARIN SODIUM 7.5 MG PO TABS
7.5000 mg | ORAL_TABLET | Freq: Once | ORAL | Status: AC
  Administered 2015-05-07: 7.5 mg via ORAL
  Filled 2015-05-07: qty 1

## 2015-05-07 MED ORDER — METHOCARBAMOL 500 MG PO TABS
500.0000 mg | ORAL_TABLET | Freq: Four times a day (QID) | ORAL | Status: DC | PRN
  Administered 2015-05-07 (×2): 500 mg via ORAL
  Filled 2015-05-07 (×2): qty 1

## 2015-05-07 NOTE — Progress Notes (Signed)
Occupational Therapy Session Note  Patient Details  Name: [name] MRN: [id number] Date of Birth: [birth year]  Today's Date: 05/07/2015 OT Individual Time: 0900-1000 OT Individual Time Calculation (min): 60 min    Short Term Goals: Week 1:  OT Short Term Goal 1 (Week 1): Pt will transfer to [facility] with +1 max A  OT Short Term Goal 2 (Week 1): Pt will don shirt with setup OT Short Term Goal 3 (Week 1): Pt will thread LB clothing with mod A  OT Short Term Goal 4 (Week 1): Pt will perform bed mobility supine <>sit with min A with extra time OT Short Term Goal 5 (Week 1): Pt will perform sit to stand with max A in prep for clothing management   Skilled Therapeutic Interventions/Progress Updates: ADL-retraining at bed and sink with focus on improve use of AE (LH sponge, reacher), squat-pivot transfer out of bed, improved performance of supine bathing/dressing using roll to left and pelvic lift.   Pt able to perform lower body bathing supine using LH sponge and setup assist and instructional cues to wash peri-area and buttocks.  Pt able to lace left leg and hike up pants this session w/o need for reacher.   OT re-educated pt on lateral scoot to w/c versus slide board as more expedient transfer method with therapist blocking left foot from sliding and manually placing left foot under left knee to facilitate improved lift of buttocks off mattress.   Pt completes upper body bathing/dressing at sink with setup assist this session.   Pt left in w/c at end of session with all needs within reach.   PA-C alerted to need for review with provider of NWB precaution through left wrist to advocate for reduction of precautions d/t need for advancing in treatment.     Therapy Documentation Precautions:  Precautions Precautions: Fall Precaution Comments: no specific weight bearing order but avoided weight bearing through L wrist; wound to right LE and groin Restrictions Weight Bearing Restrictions: Yes Other  Position/Activity Restrictions: see above  Pain: Pain Assessment Pain Assessment: 0-10 Pain Score: 7  Pain Type: Acute pain Pain Location: Leg Pain Orientation: Right Pain Intervention(s): Medication (See eMAR)  ADL: ADL ADL Comments: see Care tool function  See Function Navigator for Current Functional Status.   Therapy/Group: Individual Therapy  Rawlins Stuard 05/07/2015, 11:18 AM

## 2015-05-07 NOTE — Progress Notes (Signed)
Occupational Therapy Session Note  Patient Details  Name: [name] MRN: [id number] Date of Birth: 17-Dec-1955  Today's Date: 05/07/2015 OT Individual Time: 1300-1415 OT Individual Time Calculation (min): 75 min    Short Term Goals: Week 1:  OT Short Term Goal 1 (Week 1): Pt will transfer to [facility] with +1 max A  OT Short Term Goal 2 (Week 1): Pt will don shirt with setup OT Short Term Goal 3 (Week 1): Pt will thread LB clothing with mod A  OT Short Term Goal 4 (Week 1): Pt will perform bed mobility supine <>sit with min A with extra time OT Short Term Goal 5 (Week 1): Pt will perform sit to stand with max A in prep for clothing management   Skilled Therapeutic Interventions/Progress Updates:    Treatment session with focus on functional transfers, education on AE, and Lt wrist gentle ROM.  Engaged in shower transfer training with use of tub transfer bench in room shower.  Required assist to position w/c for proper positioning to increase safety and ease of transfer utilizing lateral scoots.  Pt progressed from min/steady assist to supervision with transfer in/out of shower.  Discussed setup of shower at home, with pt reporting they had a seat for his wife.  Educated pt on use of reacher to doff socks and shoe, shoe horn to don shoes, and reacher to assist with LB dressing.  Engaged in lateral leans to simulate LB dressing and hygiene at seated level in shower.  Pt reports stiffness at Lt wrist, engaged in retrograde massage, gentle ROM at wrist - both PROM and AAROM, and active grasp and thumb opposition to promote circulation and decrease stiffness.  Therapy Documentation Precautions:  Precautions Precautions: Fall Precaution Comments: no specific weight bearing order but avoided weight bearing through L wrist; wound to right LE and groin Restrictions Weight Bearing Restrictions: Yes Other Position/Activity Restrictions: see above General:   Vital Signs: Therapy Vitals Temp: 98.5 F  (36.9 C) Temp Source: Oral Pulse Rate: 94 Resp: 18 BP: (!) 138/101 mmHg Patient Position (if appropriate): Sitting Oxygen Therapy SpO2: 100 % O2 Device: Not Delivered Pain: Pain Assessment Pain Score: 6  Pain Type: Acute pain Pain Location: Toe (Comment which one) Pain Orientation: Right Pain Descriptors / Indicators: Sore;Throbbing Pain Onset: On-going Pain Intervention(s): Rest ADL: ADL ADL Comments: see Care tool function  See Function Navigator for Current Functional Status.   Therapy/Group: Individual Therapy  Rosalio Loud 05/07/2015, 3:26 PM

## 2015-05-07 NOTE — Progress Notes (Signed)
Physical Therapy Session Note  Patient Details  Name: [name] MRN: [id number] Date of Birth: 1955-11-30  Today's Date: 05/07/2015 PT Individual Time: 1112-1212 PT Individual Time Calculation (min): 60 min   Short Term Goals: Week 1:  PT Short Term Goal 1 (Week 1): Pt will initiate ambulation during PT session.  PT Short Term Goal 1 - Progress (Week 1): Met PT Short Term Goal 2 (Week 1): Pt will tolerate sitting up in w/c oob x 4 hours.  PT Short Term Goal 2 - Progress (Week 1): Progressing toward goal PT Short Term Goal 3 (Week 1): Pt will initiate stand-pivot bed to/from w/c transfers.  PT Short Term Goal 3 - Progress (Week 1): Met PT Short Term Goal 4 (Week 1): Pt will initiate stair training during PT session.  PT Short Term Goal 4 - Progress (Week 1): Met PT Short Term Goal 5 (Week 1): Pt will demonstrate 1 person assist scoot transfers with slideboard req mod cues for head hips relationship.  PT Short Term Goal 5 - Progress (Week 1): Met  Skilled Therapeutic Interventions/Progress Updates:   Pt received in w/c; no word from Dr. Sharol Given or MD about WB restrictions on RLE and OT notifying this PT that pt's xrays did not reveal fractures in L wrist; will need to clarify orders with MD.  Patient may not require use of platform for RW.  Discussed specifics of equipment with pt and possibility of downgrading goals to more of a w/c level depending on WB restrictions at home; pt agreeable to hospital bed, reports he has a RW at home and they may still have his wife's w/c but needs wife to check.  Discussed having wife bring in equipment from home to assess. Made list of questions and suggestions for wife.  Performed w/c mobility training x 100' in controlled environment with >2 turns with supervision and extra time with pt propelling with LLE and RUE.  Reviewed car transfer with pt performing stand pivot w/c <> simulated mid size SUV with pt requiring mod A for pivot but min A to bring RLE  into/out of car.  Returned to w/c and transitioned to large gym where pt performed transfer stand pivot w/c <> Nustep with mod A.  Performed Nustep at level 4 x 5 minutes with LLE and RUE for extension strengthening and endurance training.  Returned to room and pt set up with tray table and all items within reach.    Therapy Documentation Precautions:  Precautions Precautions: Fall Precaution Comments: no specific weight bearing order but avoided weight bearing through L wrist; wound to right LE and groin Restrictions Weight Bearing Restrictions: Yes Other Position/Activity Restrictions: see above Pain: Pain Assessment Pain Assessment: 0-10 Pain Score: 6  Pain Type: Acute pain Pain Location: Toe (Comment which one) Pain Orientation: Right Pain Descriptors / Indicators: Sore;Throbbing Pain Onset: On-going Pain Intervention(s): Rest  See Function Navigator for Current Functional Status.   Therapy/Group: Individual Therapy  [name] 05/07/2015, 12:45 PM

## 2015-05-07 NOTE — Progress Notes (Signed)
ANTICOAGULATION CONSULT NOTE - Follow Up Consult  Pharmacy Consult for warfarin Indication: DVT  Allergies  Allergen Reactions  . Imitrex [Sumatriptan] Other (See Comments)    Chest pain, 2004 (tolerates 50 mg prn)  . Verapamil Other (See Comments)    Causes pvc's    Patient Measurements: Weight: 271 lb (122.925 kg)  Vital Signs: Temp: 98.7 F (37.1 C) (10/11 0510) Temp Source: Oral (10/11 0510) BP: 157/86 mmHg (10/11 0510) Pulse Rate: 89 (10/11 0510)  Labs:  Recent Labs  05/05/15 0607 05/06/15 0517 05/07/15 0510  HGB  --   --  8.1*  HCT  --   --  26.4*  PLT  --   --  356  LABPROT 23.1* 21.2* 22.9*  INR 2.06* 1.84* 2.04*    Estimated Creatinine Clearance: 126.6 mL/min (by C-G formula based on Cr of 0.91).  Assessment: 59 yo male with DVT who is currently on warfarin.INR 2.04 today. hgb 8.1, plt 356K, stable. No bleeding noted per chart.  Goal of Therapy:  INR 2-3 Monitor platelets by anticoagulation protocol: Yes   Plan:  Warfarin 7.5 mg po x1 today Daily INR, CBC q72h   Bayard Hugger, PharmD, BCPS  Clinical Pharmacist  Pager: [phone]   05/07/2015,12:59 PM

## 2015-05-07 NOTE — Consult Note (Addendum)
WOC re-consult: Consult requested for scrotum/inner groin area; appearance red and moist and consistent with moisture associated skin damage.  Plan: Antifungal powder to affected area PRN and leave open to air when not in therapy.  Pt has a low-airloss bed which will promote airflow. Please re-consult if further assistance is needed.  Thank-you,  Cammie Mcgee MSN, RN, CWOCN, [place], CNS [phone]

## 2015-05-07 NOTE — Progress Notes (Signed)
Subjective/Complaints:  tingling in fingers of left hand not increasing Still has foley, bowels are moving Daughter in room  No SOB or CP    Objective: Vital Signs: Blood pressure 157/86, pulse 89, temperature 98.7 F (37.1 C), temperature source Oral, resp. rate 17, weight 122.925 kg (271 lb), SpO2 99 %. No results found. Results for orders placed or performed during the hospital encounter of 04/30/15 (from the past 72 hour(s))  CBC     Status: Abnormal   Collection Time: 05/04/15  9:51 AM  Result Value Ref Range   WBC 9.3 4.0 - 10.5 K/uL   RBC 3.34 (L) 4.22 - 5.81 MIL/uL   Hemoglobin 8.9 (L) 13.0 - 17.0 g/dL   HCT 82.9 (L) 56.2 - 13.0 %   MCV 88.0 78.0 - 100.0 fL   MCH 26.6 26.0 - 34.0 pg   MCHC 30.3 30.0 - 36.0 g/dL   RDW 86.5 (H) 78.4 - 69.6 %   Platelets 417 (H) 150 - 400 K/uL  Protime-INR     Status: Abnormal   Collection Time: 05/05/15  6:07 AM  Result Value Ref Range   Prothrombin Time 23.1 (H) 11.6 - 15.2 seconds   INR 2.06 (H) 0.00 - 1.49  Protime-INR     Status: Abnormal   Collection Time: 05/06/15  5:17 AM  Result Value Ref Range   Prothrombin Time 21.2 (H) 11.6 - 15.2 seconds   INR 1.84 (H) 0.00 - 1.49  Protime-INR     Status: Abnormal   Collection Time: 05/07/15  5:10 AM  Result Value Ref Range   Prothrombin Time 22.9 (H) 11.6 - 15.2 seconds   INR 2.04 (H) 0.00 - 1.49  CBC     Status: Abnormal   Collection Time: 05/07/15  5:10 AM  Result Value Ref Range   WBC 7.4 4.0 - 10.5 K/uL   RBC 3.05 (L) 4.22 - 5.81 MIL/uL   Hemoglobin 8.1 (L) 13.0 - 17.0 g/dL   HCT 29.5 (L) 28.4 - 13.2 %   MCV 86.6 78.0 - 100.0 fL   MCH 26.6 26.0 - 34.0 pg   MCHC 30.7 30.0 - 36.0 g/dL   RDW 44.0 (H) 10.2 - 72.5 %   Platelets 356 150 - 400 K/uL     HEENT: normal Cardio: RRR and no murmur Resp: CTA B/L and unlabored GI: BS positive and NT, ND Extremity:  Pulses positive and No Edema Skin:   Breakdown Right foot with erythema and small areas of black eschar on great  toe and Wound midline abd incision healing well staples removed Neuro: Alert/Oriented, Anxious, Abnormal Sensory reduced sensation to LT in left fingers and Right medial and ant thigh and right medial leg   Abnormal Motor 4- R hip add, 5/5 left Hip add  4/5 left  Knee ext , 2- RIght Knee ext , 3- Left hand grip, 3- Right ankle , 4- Left ankle , RUE normal Musc/Skel:  Other healed incision bilateral knee Gen NAD except complained of fatigue with minmal activity   Assessment/Plan: 1. 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 Physiatrist is providing close team supervision and 24 hour management of active medical problems listed below. Physiatrist and rehab team continue to assess barriers to discharge/monitor patient progress toward functional and medical goals.  FIM: Function - Bathing Position:  Bed Body parts bathed by patient: Right arm, Left arm, Chest, Abdomen, Front perineal area Body parts bathed by helper: Front perineal area, Buttocks, Back Bathing not applicable: Buttocks, Right upper leg, Left upper leg, Front perineal area, Right lower leg, Left lower leg (UB bathing only today) Assist Level: Touching or steadying assistance(Pt > 75%)  Function- Upper Body Dressing/Undressing What is the patient wearing?: Pull over shirt/dress Pull over shirt/dress - Perfomed by patient: Thread/unthread right sleeve, Thread/unthread left sleeve, Put head through opening, Pull shirt over trunk Assist Level: Set up Function - Lower Body Dressing/Undressing Lower body dressing/undressing activity did not occur: Safety/medical concerns What is the patient wearing?: Non-skid slipper socks Position: Bed Underwear - Performed by helper:  Thread/unthread right underwear leg, Thread/unthread left underwear leg, Pull underwear up/down Pants- Performed by helper: Thread/unthread right pants leg, Thread/unthread left pants leg, Pull pants up/down Non-skid slipper socks- Performed by helper: Don/doff right sock TED Hose - Performed by helper: Don/doff left TED hose (only one TED hose- on the left) Assist for footwear: Maximal assist (sock applied by helper - dependent) Assist for lower body dressing: Supervision or verbal cues  Function - Toileting Toileting activity did not occur: N/A (no void, no bm, toileting did not occur) Toileting steps completed by helper: Adjust clothing prior to toileting, Performs perineal hygiene, Adjust clothing after toileting Toileting Assistive Devices: Grab bar or rail Medical Problem List and Plan: 1. 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  DVT Prophylaxis/Anticoagulation: Coumadin for thromboembolism. Monitor for any bleeding episodes 3. Pain Management: MS Contin 90 mg twice a day, oxycodone for breakthrough pain. Monitor with increased mobility, at this point not much nerve pain in RLE 4. Mood: Effexor XR 75 mg daily, Klonopin 0.5 mg 4 times a day as needed 5. Neuropsych: This patient is capable of making decisions on his own behalf. 6. Skin/Wound Care: Skin care is supervised to right foot. 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, still in process of demarcation 7. Fluids/Electrolytes/Nutrition: Strict I and O's with follow-up chemistries 8.ID. Presently on Ancef 2 g 3 times a day. ID rec total 6 wks, UCx showed no growth so no changes needed 9. Tachycardia SVT. Follow-up cardiology services. Continue Lopressor 50 mg twice a day for now, HR ok at 91 this am 10. Constipation.  Laxitive  assistance, per pt doing well 11.  Severe protein malnutrition, alb 1.6- supplement with beneprotein, enc hi protein diet choices Urinary retention- looks ready for voiding trial Function - Toilet Transfers Toilet transfer assistive device: Drop arm commode, Sliding board, Walker Assist level to toilet:  (Sliding board to toilet) Assist level from toilet:  (Stand pivot PFRW) Assist level to bedside commode (at bedside): Touching or steadying assistance (Pt > 75%) (Sliding Board) Assist level from bedside commode (at bedside): Moderate assist (Pt 50 - 74%/lift or lower) (Stand pivot with PFRW)  Function - Chair/bed transfer Chair/bed transfer method: Stand pivot Chair/bed transfer assist level: Maximal assist (Pt 25 - 49%/lift and lower) Chair/bed transfer assistive device: Walker Chair/bed transfer details: Manual facilitation for weight shifting, Verbal cues for sequencing, Verbal cues for technique, Verbal cues for precautions/safety  Function - Locomotion: Wheelchair Will patient use wheelchair at discharge?: Yes Type: Manual Max wheelchair distance: 150 Assist Level: Supervision or verbal cues Wheel 50 feet with 2 turns activity did not occur: Safety/medical concerns Assist  Level: Supervision or verbal cues Wheel 150 feet activity did not occur: Safety/medical concerns Assist Level: Supervision or verbal cues Turns around,maneuvers to table,bed, and toilet,negotiates 3% grade,maneuvers on rugs and over doorsills: No (min A for 3% grade) Function - Locomotion: Ambulation Assistive device: Walker-rolling Max distance: 12 Assist level: Moderate assist (Pt 50 - 74%) Walk 10 feet activity did not occur: Safety/medical concerns Assist level: Moderate assist (Pt 50 - 74%) Walk 50 feet with 2 turns activity did not occur: Safety/medical concerns Walk 150 feet activity did not occur: Safety/medical concerns Walk 10 feet on uneven surfaces activity did not occur: Safety/medical  concerns  Function - Comprehension Comprehension: Auditory Comprehension assist level: Follows complex conversation/direction with extra time/assistive device  Function - Expression Expression: Verbal Expression assist level: Expresses complex ideas: With extra time/assistive device  Function - Social Interaction Social Interaction assist level: Interacts appropriately with others with medication or extra time (anti-anxiety, antidepressant).  Function - Problem Solving Problem solving assist level: Solves basic problems with no assist  Function - Memory Memory assist level: Recognizes or recalls 90% of the time/requires cueing < 10% of the time Patient normally able to recall (first 3 days only): Current season, Location of own room, Staff names and faces, That he or she is in a hospital    LOS (Days) 7 A FACE TO FACE EVALUATION WAS PERFORMED  KIRSTEINS,ANDREW E 05/07/2015, 7:31 AM

## 2015-05-08 ENCOUNTER — Inpatient Hospital Stay (HOSPITAL_COMMUNITY): Payer: Medicaid Other | Admitting: Physical Therapy

## 2015-05-08 ENCOUNTER — Inpatient Hospital Stay (HOSPITAL_COMMUNITY): Payer: Medicaid Other

## 2015-05-08 ENCOUNTER — Inpatient Hospital Stay (HOSPITAL_COMMUNITY)
Admission: AD | Admit: 2015-05-08 | Discharge: 2015-05-10 | DRG: 308 | Disposition: A | Discharge status: Rehabilitation Facility | Payer: Medicaid Other | Source: Ambulatory Visit | Attending: Internal Medicine | Admitting: Internal Medicine

## 2015-05-08 ENCOUNTER — Inpatient Hospital Stay (HOSPITAL_COMMUNITY): Payer: 59 | Admitting: Occupational Therapy

## 2015-05-08 ENCOUNTER — Other Ambulatory Visit: Payer: Self-pay | Admitting: Cardiology

## 2015-05-08 ENCOUNTER — Inpatient Hospital Stay (HOSPITAL_COMMUNITY): Payer: 59 | Admitting: Physical Therapy

## 2015-05-08 DIAGNOSIS — Z79891 Long term (current) use of opiate analgesic: Secondary | ICD-10-CM | POA: Diagnosis not present

## 2015-05-08 DIAGNOSIS — I272 Other secondary pulmonary hypertension: Secondary | ICD-10-CM | POA: Diagnosis present

## 2015-05-08 DIAGNOSIS — R7881 Bacteremia: Secondary | ICD-10-CM | POA: Diagnosis present

## 2015-05-08 DIAGNOSIS — G5612 Other lesions of median nerve, left upper limb: Secondary | ICD-10-CM

## 2015-05-08 DIAGNOSIS — I10 Essential (primary) hypertension: Secondary | ICD-10-CM | POA: Diagnosis present

## 2015-05-08 DIAGNOSIS — A419 Sepsis, unspecified organism: Secondary | ICD-10-CM

## 2015-05-08 DIAGNOSIS — M109 Gout, unspecified: Secondary | ICD-10-CM | POA: Diagnosis present

## 2015-05-08 DIAGNOSIS — G894 Chronic pain syndrome: Secondary | ICD-10-CM | POA: Diagnosis not present

## 2015-05-08 DIAGNOSIS — I824Z1 Acute embolism and thrombosis of unspecified deep veins of right distal lower extremity: Secondary | ICD-10-CM | POA: Insufficient documentation

## 2015-05-08 DIAGNOSIS — R6520 Severe sepsis without septic shock: Secondary | ICD-10-CM | POA: Diagnosis present

## 2015-05-08 DIAGNOSIS — I471 Supraventricular tachycardia, unspecified: Secondary | ICD-10-CM | POA: Diagnosis present

## 2015-05-08 DIAGNOSIS — I724 Aneurysm of artery of lower extremity: Secondary | ICD-10-CM | POA: Diagnosis not present

## 2015-05-08 DIAGNOSIS — I80291 Phlebitis and thrombophlebitis of other deep vessels of right lower extremity: Secondary | ICD-10-CM

## 2015-05-08 DIAGNOSIS — I96 Gangrene, not elsewhere classified: Secondary | ICD-10-CM | POA: Diagnosis not present

## 2015-05-08 DIAGNOSIS — Z79899 Other long term (current) drug therapy: Secondary | ICD-10-CM | POA: Diagnosis not present

## 2015-05-08 DIAGNOSIS — R29898 Other symptoms and signs involving the musculoskeletal system: Secondary | ICD-10-CM

## 2015-05-08 DIAGNOSIS — G8929 Other chronic pain: Secondary | ICD-10-CM | POA: Diagnosis present

## 2015-05-08 DIAGNOSIS — M25532 Pain in left wrist: Secondary | ICD-10-CM | POA: Diagnosis not present

## 2015-05-08 DIAGNOSIS — A4901 Methicillin susceptible Staphylococcus aureus infection, unspecified site: Secondary | ICD-10-CM | POA: Diagnosis not present

## 2015-05-08 DIAGNOSIS — L03114 Cellulitis of left upper limb: Secondary | ICD-10-CM | POA: Diagnosis not present

## 2015-05-08 DIAGNOSIS — A4101 Sepsis due to Methicillin susceptible Staphylococcus aureus: Secondary | ICD-10-CM | POA: Diagnosis present

## 2015-05-08 DIAGNOSIS — G5721 Lesion of femoral nerve, right lower limb: Secondary | ICD-10-CM | POA: Diagnosis not present

## 2015-05-08 DIAGNOSIS — M86171 Other acute osteomyelitis, right ankle and foot: Secondary | ICD-10-CM | POA: Diagnosis not present

## 2015-05-08 DIAGNOSIS — Z86718 Personal history of other venous thrombosis and embolism: Secondary | ICD-10-CM | POA: Diagnosis not present

## 2015-05-08 DIAGNOSIS — D62 Acute posthemorrhagic anemia: Secondary | ICD-10-CM | POA: Diagnosis not present

## 2015-05-08 DIAGNOSIS — I82409 Acute embolism and thrombosis of unspecified deep veins of unspecified lower extremity: Secondary | ICD-10-CM | POA: Diagnosis present

## 2015-05-08 DIAGNOSIS — Z7901 Long term (current) use of anticoagulants: Secondary | ICD-10-CM

## 2015-05-08 DIAGNOSIS — Z87891 Personal history of nicotine dependence: Secondary | ICD-10-CM

## 2015-05-08 DIAGNOSIS — E785 Hyperlipidemia, unspecified: Secondary | ICD-10-CM | POA: Diagnosis present

## 2015-05-08 DIAGNOSIS — R609 Edema, unspecified: Secondary | ICD-10-CM

## 2015-05-08 DIAGNOSIS — S52592A Other fractures of lower end of left radius, initial encounter for closed fracture: Secondary | ICD-10-CM | POA: Diagnosis not present

## 2015-05-08 DIAGNOSIS — S62102A Fracture of unspecified carpal bone, left wrist, initial encounter for closed fracture: Secondary | ICD-10-CM | POA: Insufficient documentation

## 2015-05-08 DIAGNOSIS — M00032 Staphylococcal arthritis, left wrist: Secondary | ICD-10-CM | POA: Diagnosis not present

## 2015-05-08 DIAGNOSIS — Z96653 Presence of artificial knee joint, bilateral: Secondary | ICD-10-CM | POA: Diagnosis present

## 2015-05-08 DIAGNOSIS — R52 Pain, unspecified: Secondary | ICD-10-CM

## 2015-05-08 DIAGNOSIS — S91309A Unspecified open wound, unspecified foot, initial encounter: Secondary | ICD-10-CM | POA: Diagnosis present

## 2015-05-08 DIAGNOSIS — M71032 Abscess of bursa, left wrist: Secondary | ICD-10-CM | POA: Insufficient documentation

## 2015-05-08 DIAGNOSIS — G629 Polyneuropathy, unspecified: Secondary | ICD-10-CM | POA: Diagnosis present

## 2015-05-08 DIAGNOSIS — R5381 Other malaise: Secondary | ICD-10-CM | POA: Diagnosis present

## 2015-05-08 DIAGNOSIS — N179 Acute kidney failure, unspecified: Secondary | ICD-10-CM | POA: Diagnosis present

## 2015-05-08 DIAGNOSIS — F4321 Adjustment disorder with depressed mood: Secondary | ICD-10-CM | POA: Diagnosis not present

## 2015-05-08 DIAGNOSIS — B9561 Methicillin susceptible Staphylococcus aureus infection as the cause of diseases classified elsewhere: Secondary | ICD-10-CM | POA: Diagnosis not present

## 2015-05-08 DIAGNOSIS — E43 Unspecified severe protein-calorie malnutrition: Secondary | ICD-10-CM | POA: Diagnosis not present

## 2015-05-08 DIAGNOSIS — B488 Other specified mycoses: Secondary | ICD-10-CM | POA: Diagnosis not present

## 2015-05-08 DIAGNOSIS — R000 Tachycardia, unspecified: Secondary | ICD-10-CM

## 2015-05-08 DIAGNOSIS — M86132 Other acute osteomyelitis, left radius and ulna: Secondary | ICD-10-CM | POA: Diagnosis not present

## 2015-05-08 DIAGNOSIS — R509 Fever, unspecified: Secondary | ICD-10-CM

## 2015-05-08 HISTORY — DX: Gastro-esophageal reflux disease without esophagitis: K21.9

## 2015-05-08 HISTORY — DX: Anxiety disorder, unspecified: F41.9

## 2015-05-08 HISTORY — DX: Peripheral vascular disease, unspecified: I73.9

## 2015-05-08 LAB — COMPREHENSIVE METABOLIC PANEL
ALK PHOS: 113 U/L (ref 38–126)
ALT: 14 U/L — AB (ref 17–63)
AST: 30 U/L (ref 15–41)
Albumin: 2 g/dL — ABNORMAL LOW (ref 3.5–5.0)
Anion gap: 9 (ref 5–15)
BILIRUBIN TOTAL: 0.8 mg/dL (ref 0.3–1.2)
BUN: 6 mg/dL (ref 6–20)
CALCIUM: 8.1 mg/dL — AB (ref 8.9–10.3)
CHLORIDE: 97 mmol/L — AB (ref 101–111)
CO2: 27 mmol/L (ref 22–32)
CREATININE: 0.89 mg/dL (ref 0.61–1.24)
Glucose, Bld: 116 mg/dL — ABNORMAL HIGH (ref 65–99)
Potassium: 3.9 mmol/L (ref 3.5–5.1)
Sodium: 133 mmol/L — ABNORMAL LOW (ref 135–145)
TOTAL PROTEIN: 6.7 g/dL (ref 6.5–8.1)

## 2015-05-08 LAB — CBC WITH DIFFERENTIAL/PLATELET
Basophils Absolute: 0.1 10*3/uL (ref 0.0–0.1)
Basophils Relative: 1 %
EOS PCT: 1 %
Eosinophils Absolute: 0.1 10*3/uL (ref 0.0–0.7)
HEMATOCRIT: 28.3 % — AB (ref 39.0–52.0)
Hemoglobin: 8.8 g/dL — ABNORMAL LOW (ref 13.0–17.0)
LYMPHS ABS: 3.5 10*3/uL (ref 0.7–4.0)
LYMPHS PCT: 35 %
MCH: 26.9 pg (ref 26.0–34.0)
MCHC: 31.1 g/dL (ref 30.0–36.0)
MCV: 86.5 fL (ref 78.0–100.0)
Monocytes Absolute: 1.2 10*3/uL — ABNORMAL HIGH (ref 0.1–1.0)
Monocytes Relative: 12 %
NEUTROS ABS: 5.1 10*3/uL (ref 1.7–7.7)
Neutrophils Relative %: 51 %
PLATELETS: 348 10*3/uL (ref 150–400)
RBC: 3.27 MIL/uL — AB (ref 4.22–5.81)
RDW: 16.1 % — ABNORMAL HIGH (ref 11.5–15.5)
WBC: 10 10*3/uL (ref 4.0–10.5)

## 2015-05-08 LAB — TROPONIN I
Troponin I: <0.03 ng/mL (ref <0.031)
Troponin I: <0.03 ng/mL (ref <0.031)

## 2015-05-08 LAB — URINALYSIS, ROUTINE W REFLEX MICROSCOPIC
Bilirubin Urine: NEGATIVE
GLUCOSE, UA: NEGATIVE mg/dL
KETONES UR: NEGATIVE mg/dL
Leukocytes, UA: NEGATIVE
Nitrite: NEGATIVE
PH: 8 (ref 5.0–8.0)
Protein, ur: 30 mg/dL — AB
SPECIFIC GRAVITY, URINE: 1.011 (ref 1.005–1.030)
Urobilinogen, UA: 1 mg/dL (ref 0.0–1.0)

## 2015-05-08 LAB — PROTIME-INR
INR: 2.38 — ABNORMAL HIGH (ref 0.00–1.49)
INR: 2.47 — AB (ref 0.00–1.49)
PROTHROMBIN TIME: 26.4 s — AB (ref 11.6–15.2)
Prothrombin Time: 25.7 seconds — ABNORMAL HIGH (ref 11.6–15.2)

## 2015-05-08 LAB — PROCALCITONIN: Procalcitonin: <0.1 ng/mL

## 2015-05-08 LAB — APTT: APTT: 57 s — AB (ref 24–37)

## 2015-05-08 LAB — URINE MICROSCOPIC-ADD ON

## 2015-05-08 LAB — LACTIC ACID, PLASMA: Lactic Acid, Venous: 0.9 mmol/L (ref 0.5–2.0)

## 2015-05-08 LAB — MRSA PCR SCREENING: MRSA BY PCR: NEGATIVE

## 2015-05-08 MED ORDER — VERAPAMIL HCL 2.5 MG/ML IV SOLN
5.0000 mg | Freq: Once | INTRAVENOUS | Status: DC
  Filled 2015-05-08: qty 2

## 2015-05-08 MED ORDER — ACETAMINOPHEN 325 MG PO TABS: 650.0000 mg | ORAL_TABLET | Freq: Four times a day (QID) | ORAL | Status: DC | PRN

## 2015-05-08 MED ORDER — SODIUM CHLORIDE 0.9 % IV BOLUS (SEPSIS): 1000.0000 mL | Freq: Once | INTRAVENOUS | Status: DC

## 2015-05-08 MED ORDER — DILTIAZEM HCL 25 MG/5ML IV SOLN
10.0000 mg | Freq: Once | INTRAVENOUS | Status: DC
  Filled 2015-05-08: qty 5

## 2015-05-08 MED ORDER — NALOXEGOL OXALATE 25 MG PO TABS
25.0000 mg | ORAL_TABLET | Freq: Every day | ORAL | Status: DC
  Administered 2015-05-08 – 2015-05-10 (×3): 25 mg via ORAL
  Filled 2015-05-08 (×4): qty 1

## 2015-05-08 MED ORDER — SODIUM CHLORIDE 0.9 % IV SOLN
INTRAVENOUS | Status: AC
Start: 2015-05-08 — End: 2015-05-08

## 2015-05-08 MED ORDER — FLECAINIDE ACETATE 100 MG PO TABS
100.0000 mg | ORAL_TABLET | Freq: Two times a day (BID) | ORAL | Status: DC
  Administered 2015-05-08 – 2015-05-10 (×5): 100 mg via ORAL
  Filled 2015-05-08 (×6): qty 1

## 2015-05-08 MED ORDER — TRIFLURIDINE 1 % OP SOLN
1.0000 [drp] | Freq: Two times a day (BID) | OPHTHALMIC | Status: DC | PRN
  Filled 2015-05-08: qty 7.5

## 2015-05-08 MED ORDER — CLONAZEPAM 1 MG PO TABS
2.0000 mg | ORAL_TABLET | Freq: Two times a day (BID) | ORAL | Status: DC | PRN
  Administered 2015-05-08 – 2015-05-10 (×4): 2 mg via ORAL
  Filled 2015-05-08 (×4): qty 2

## 2015-05-08 MED ORDER — SODIUM CHLORIDE 0.9 % IJ SOLN
3.0000 mL | Freq: Two times a day (BID) | INTRAMUSCULAR | Status: DC
  Administered 2015-05-08: 10 mL via INTRAVENOUS
  Administered 2015-05-08 – 2015-05-09 (×3): 3 mL via INTRAVENOUS
  Administered 2015-05-10: 20 mL via INTRAVENOUS

## 2015-05-08 MED ORDER — PIPERACILLIN-TAZOBACTAM 3.375 G IVPB
3.3750 g | Freq: Three times a day (TID) | INTRAVENOUS | Status: DC
  Administered 2015-05-08: 3.375 g via INTRAVENOUS
  Filled 2015-05-08 (×3): qty 50

## 2015-05-08 MED ORDER — OXYCODONE HCL 5 MG PO TABS: 30.0000 mg | ORAL_TABLET | Freq: Four times a day (QID) | ORAL | Status: DC | PRN

## 2015-05-08 MED ORDER — METOPROLOL TARTRATE 50 MG PO TABS
75.0000 mg | ORAL_TABLET | Freq: Two times a day (BID) | ORAL | Status: DC
  Administered 2015-05-08 – 2015-05-10 (×4): 75 mg via ORAL
  Filled 2015-05-08 (×8): qty 1

## 2015-05-08 MED ORDER — ONDANSETRON 4 MG PO TBDP: 8.0000 mg | ORAL_TABLET | Freq: Three times a day (TID) | ORAL | Status: DC | PRN

## 2015-05-08 MED ORDER — MORPHINE SULFATE ER 30 MG PO TBCR
90.0000 mg | EXTENDED_RELEASE_TABLET | Freq: Two times a day (BID) | ORAL | Status: DC
  Administered 2015-05-08 – 2015-05-10 (×4): 90 mg via ORAL
  Filled 2015-05-08 (×4): qty 3

## 2015-05-08 MED ORDER — WARFARIN SODIUM 7.5 MG PO TABS
7.5000 mg | ORAL_TABLET | Freq: Once | ORAL | Status: AC
  Administered 2015-05-08: 7.5 mg via ORAL
  Filled 2015-05-08: qty 1

## 2015-05-08 MED ORDER — SODIUM CHLORIDE 0.9 % IV SOLN: 250.0000 mL | INTRAVENOUS | Status: DC | PRN

## 2015-05-08 MED ORDER — OXYCODONE-ACETAMINOPHEN 5-325 MG PO TABS
2.0000 | ORAL_TABLET | Freq: Four times a day (QID) | ORAL | Status: DC | PRN
  Administered 2015-05-08 – 2015-05-10 (×4): 2 via ORAL
  Filled 2015-05-08 (×4): qty 2

## 2015-05-08 MED ORDER — DILTIAZEM HCL 100 MG IV SOLR: 5.0000 mg/h | INTRAVENOUS | Status: DC

## 2015-05-08 MED ORDER — CEFAZOLIN SODIUM-DEXTROSE 2-3 GM-% IV SOLR
2.0000 g | Freq: Three times a day (TID) | INTRAVENOUS | Status: DC
  Filled 2015-05-08: qty 50

## 2015-05-08 MED ORDER — COLCHICINE 0.6 MG PO TABS: 0.6000 mg | ORAL_TABLET | Freq: Two times a day (BID) | ORAL | Status: DC

## 2015-05-08 MED ORDER — CEFAZOLIN SODIUM-DEXTROSE 2-3 GM-% IV SOLR
2.0000 g | Freq: Three times a day (TID) | INTRAVENOUS | Status: DC
  Filled 2015-05-08 (×2): qty 50

## 2015-05-08 MED ORDER — SODIUM CHLORIDE 0.9 % IJ SOLN
3.0000 mL | Freq: Two times a day (BID) | INTRAMUSCULAR | Status: DC
  Administered 2015-05-10: 10 mL via INTRAVENOUS

## 2015-05-08 MED ORDER — CEFAZOLIN SODIUM-DEXTROSE 2-3 GM-% IV SOLR
2.0000 g | Freq: Three times a day (TID) | INTRAVENOUS | Status: DC
  Administered 2015-05-08 – 2015-05-10 (×6): 2 g via INTRAVENOUS
  Filled 2015-05-08 (×8): qty 50

## 2015-05-08 MED ORDER — FAMCICLOVIR 500 MG PO TABS
500.0000 mg | ORAL_TABLET | Freq: Two times a day (BID) | ORAL | Status: DC | PRN
  Filled 2015-05-08: qty 1

## 2015-05-08 MED ORDER — COLLAGENASE 250 UNIT/GM EX OINT
TOPICAL_OINTMENT | Freq: Every day | CUTANEOUS | Status: DC
  Administered 2015-05-10: 11:00:00 via TOPICAL
  Filled 2015-05-08 (×3): qty 30

## 2015-05-08 MED ORDER — FLEET ENEMA 7-19 GM/118ML RE ENEM
1.0000 | ENEMA | Freq: Once | RECTAL | Status: DC
Start: 2015-05-08 — End: 2015-05-10
  Filled 2015-05-08: qty 1

## 2015-05-08 MED ORDER — OXYCODONE HCL 5 MG PO TABS
30.0000 mg | ORAL_TABLET | Freq: Four times a day (QID) | ORAL | Status: DC
  Administered 2015-05-08 – 2015-05-10 (×9): 30 mg via ORAL
  Filled 2015-05-08 (×9): qty 6

## 2015-05-08 MED ORDER — POLYETHYLENE GLYCOL 3350 17 G PO PACK
17.0000 g | PACK | Freq: Every day | ORAL | Status: DC
  Administered 2015-05-09 – 2015-05-10 (×2): 17 g via ORAL
  Filled 2015-05-08 (×2): qty 1

## 2015-05-08 MED ORDER — DOCUSATE SODIUM 100 MG PO CAPS
100.0000 mg | ORAL_CAPSULE | Freq: Two times a day (BID) | ORAL | Status: DC
  Administered 2015-05-08 – 2015-05-10 (×5): 100 mg via ORAL
  Filled 2015-05-08 (×5): qty 1

## 2015-05-08 MED ORDER — ACETAMINOPHEN 650 MG RE SUPP: 650.0000 mg | Freq: Four times a day (QID) | RECTAL | Status: DC | PRN

## 2015-05-08 MED ORDER — VENLAFAXINE HCL ER 75 MG PO CP24
75.0000 mg | ORAL_CAPSULE | Freq: Every day | ORAL | Status: DC
  Administered 2015-05-08 – 2015-05-10 (×3): 75 mg via ORAL
  Filled 2015-05-08 (×4): qty 1

## 2015-05-08 MED ORDER — WARFARIN - PHARMACIST DOSING INPATIENT
Freq: Every day | Status: DC
  Administered 2015-05-08 – 2015-05-09 (×2)

## 2015-05-08 MED ORDER — SUMATRIPTAN SUCCINATE 50 MG PO TABS
50.0000 mg | ORAL_TABLET | ORAL | Status: DC | PRN
  Filled 2015-05-08: qty 1

## 2015-05-08 MED ORDER — ADENOSINE 6 MG/2ML IV SOLN
6.0000 mg | Freq: Once | INTRAVENOUS | Status: AC
  Administered 2015-05-08: 6 mg via INTRAVENOUS
  Filled 2015-05-08: qty 2

## 2015-05-08 MED ORDER — ATORVASTATIN CALCIUM 80 MG PO TABS
80.0000 mg | ORAL_TABLET | Freq: Every day | ORAL | Status: DC
  Administered 2015-05-08 – 2015-05-09 (×2): 80 mg via ORAL
  Filled 2015-05-08 (×2): qty 1

## 2015-05-08 MED ORDER — MORPHINE SULFATE ER 60 MG PO TBCR: 60.0000 mg | EXTENDED_RELEASE_TABLET | Freq: Two times a day (BID) | ORAL | Status: DC

## 2015-05-08 MED ORDER — SODIUM CHLORIDE 0.9 % IJ SOLN: 3.0000 mL | INTRAMUSCULAR | Status: DC | PRN

## 2015-05-08 MED ORDER — ALBUTEROL SULFATE (2.5 MG/3ML) 0.083% IN NEBU: 3.0000 mL | INHALATION_SOLUTION | Freq: Four times a day (QID) | RESPIRATORY_TRACT | Status: DC | PRN

## 2015-05-08 MED FILL — Medication: Qty: 1 | Status: AC

## 2015-05-08 NOTE — Progress Notes (Addendum)
ANTICOAGULATION CONSULT NOTE - Follow Up Consult  Pharmacy Consult for Coumadin + Zosyn Indication: DVT  + R/o sepsis  Allergies  Allergen Reactions  . Imitrex [Sumatriptan] Other (See Comments)    Chest pain, 2004 (tolerates 50 mg prn)  . Verapamil Other (See Comments)    Causes pvc's    Patient Measurements:    Vital Signs: Temp: 99.9 F (37.7 C) (10/12 0829) Temp Source: Oral (10/12 0829) BP: 118/64 mmHg (10/12 0745) Pulse Rate: 130 (10/12 0745)  Labs:  Recent Labs  05/06/15 0517 05/07/15 0510 05/08/15 0515 05/08/15 0640  HGB  --  8.1*  --  8.8*  HCT  --  26.4*  --  28.3*  PLT  --  356  --  348  LABPROT 21.2* 22.9* 25.7*  --   INR 1.84* 2.04* 2.38*  --   CREATININE  --   --   --  0.89  TROPONINI  --   --   --  <0.03    Estimated Creatinine Clearance: 129.4 mL/min (by C-G formula based on Cr of 0.89).  Assessment: 59 y/o M transferred from rehab to 2H with SVT (130-175). 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 . 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.  Anticoagulation: right superficial artery aneurysm and right lower extremity DVT.. INR 2.38 on Coumadin. CBC stable.  Infectious Disease: MSSA bacteremia on 6 wks abs on Ancef. Famvir. Tmax 100.7. WBC 10. Start Zosyn for r/o sepsis  Cardiovascular: HTN, HLD SVT on Lipitor, Flecainide, metoprolol, verapamil  Endocrinology: colchicine.   Gastrointestinal / Nutrition: PUD. PCM on Miralax, Movantik (opioid induced constipation). C/o abdominal pain.   Neurology: Chronic pain. MS Contin, scheduled Oxy IR, Effexor XR  Nephrology: Scr 0.89  Pulmonary: RA  Hematology / Oncology: CBC stable   Goal of  Therapy:  INR 2-3 Monitor platelets by anticoagulation protocol: Yes   Plan:  Coumadin 7.5mg  po x 1 more day. May try alternating 7.5mg  and 5mg  daily. Daily INR Zosyn 3.375g IV q8hr.   [name], PharmD, BCPS Clinical Staff Pharmacist Pager [phone]  [name], [name] 05/08/2015,8:49 AM

## 2015-05-08 NOTE — Consult Note (Addendum)
CARDIOLOGY CONSULT NOTE  Patient ID: [name] MRN: [id number] DOB/AGE: 1955/12/28 59 y.o.  Admit date: 05/08/2015 Referring Physician  Hospitalist  Primary Physician:  [name], MD Reason for Consultation  Tachycardia/SVT  HPI: [name]  is a 59 y.o. male  With history of hyperlipidemia, hypertension, hypogonadism who was taking testosterone supplementation, accidentally injected into the right femoral artery followed by sepsis, mycotic aneurysm of the femoral artery and embolic complication.   Patient was recently transferred to inpatient rehabilitation after long hospitalization.  He had an episode of SVT while he was in the intensive care unit, was started on beta blocker therapy.  This morning he complained of not feeling well and upper abdominal discomfort, and was found to be tachycardic, EKG revealed SVT.  He is now transferred back to the hospital for further management and evaluation.  This morning when I saw him, he feels well, no further abdominal discomfort, denies any chest pain, shortness of breath.  No PND or orthopnea.  Still extremely weak and fatigued, states that his right leg has significant discomfort at rest and he is barely able to get up to the wheelchair and walk to the beach chair.  No other specific complaints.  No dizziness or syncope.  Past Medical History  Diagnosis Date  . Hypertension   . Chronic knee pain   . Hyperlipemia   . PUD (peptic ulcer disease)      Past Surgical History  Procedure Laterality Date  . I&d extremity Left 07/12/2013    Procedure: I&D Left Thigh Abscess;  Surgeon: [name], MD;  Location: [facility] OR;  Service: Orthopedics;  Laterality: Left;  . Replacement total knee bilateral    . Tonsillectomy    . Hernia repair    . Cholecystectomy    . Appendectomy    . Shoulder surgery    . Elbow surgery    . Femoral artery exploration Right 04/11/2015    Procedure: Resection of infected right femoral artery aneurysm;  Surgeon:  Fransisco Hertz, MD;  Location: [facility] OR;  Service: Vascular;  Laterality: Right;  . Incision and drainage abscess Right 04/11/2015    Procedure: INCISION AND DRAINAGE OF RIGHT THIGH ABSCESS;  Surgeon: Fransisco Hertz, MD;  Location: [facility] OR;  Service: Vascular;  Laterality: Right;  . Incision and drainage abscess Right 04/13/2015    Procedure: INCISION AND DRAINAGE THIGH ABSCESS;  Surgeon: Fransisco Hertz, MD;  Location: [facility] OR;  Service: Vascular;  Laterality: Right;  . I&d extremity Left 04/17/2015    Procedure: IRRIGATION AND DEBRIDEMENT LEFT EXTREMITY;  Surgeon: Fransisco Hertz, MD;  Location: [facility] OR;  Service: Vascular;  Laterality: Left;  Marland Kitchen Muscle flap closure Right 04/19/2015    Procedure: RIGHT RECTUS ABDOMINUS  FLAP TO RIGHT GROIN;  Surgeon: Glenna Fellows, MD;  Location: [facility] OR;  Service: Plastics;  Laterality: Right;  . Femoral revision Right 04/19/2015    Procedure: OVERSEW RIGHT FEMORAL VEIN ;  Surgeon: Chuck Hint, MD;  Location: [facility] OR;  Service: Vascular;  Laterality: Right;  . Wound exploration Right 04/23/2015    Procedure: EXAM UNDER ANESTHESIA AND VAC CHANGE RIGHT THIGH;  Surgeon: Glenna Fellows, MD;  Location: [facility] OR;  Service: Plastics;  Laterality: Right;     Family History  Problem Relation Age of Onset  . Heart attack Paternal Grandmother 38  . Heart disease Mother 40    arrythmia  . Stroke Paternal Grandfather   . Stroke Maternal Grandfather  Social History: Social History   Social History  . Marital Status: Married    Spouse Name: N/A  . Number of Children: 3  . Years of Education: N/A   Occupational History  .      Sales   Social History Main Topics  . Smoking status: Former Smoker    Types: Cigarettes  . Smokeless tobacco: Not on file  . Alcohol Use: No  . Drug Use: No  . Sexual Activity: Not on file   Other Topics Concern  . Not on file   Social History Narrative     Prescriptions prior to admission  Medication Sig Dispense Refill Last Dose  .  albuterol (PROAIR HFA) 108 (90 BASE) MCG/ACT inhaler Inhale 1 puff into the lungs every 6 (six) hours as needed for wheezing or shortness of breath.    04/06/2015 at Unknown time  . atorvastatin (LIPITOR) 80 MG tablet Take 80 mg by mouth daily.   few days ago  . ceFAZolin (ANCEF) 2-3 GM-% SOLR Inject 50 mLs (2 g total) into the vein every 8 (eight) hours.     . clonazePAM (KLONOPIN) 0.5 MG tablet Take 4 tablets (2 mg total) by mouth 2 (two) times daily as needed for anxiety.     . famciclovir (FAMVIR) 500 MG tablet Take 500 mg by mouth 2 (two) times daily as needed (fever blisters).    1-2 weeks ago  . Metoprolol Tartrate 75 MG TABS Take 75 mg by mouth 2 (two) times daily.     Marland Kitchen. morphine (MS CONTIN) 30 MG 12 hr tablet Take 30 mg by mouth every 12 (twelve) hours. Take with a 60 mg tablet for a 90 mg dose  0 04/06/2015 at 900  . morphine (MS CONTIN) 60 MG 12 hr tablet Take 60 mg by mouth every 12 (twelve) hours. Take with a 30 mg tablet for a 90 mg dose  0 04/06/2015 at 900  . naloxegol oxalate (MOVANTIK) 25 MG TABS tablet Take 25 mg by mouth 2 (two) times daily.   04/05/2015  . ondansetron (ZOFRAN) 8 MG tablet Take 8 mg by mouth daily as needed for nausea or vomiting.   0 04/06/2015 at Unknown time  . OVER THE COUNTER MEDICATION Place 1 drop into both eyes 2 (two) times daily as needed (dry eyes). OTC lubricant eye drops   2-3 weeks ago  . oxycodone (ROXICODONE) 30 MG immediate release tablet Take 30 mg by mouth every 6 (six) hours. scheduled   04/06/2015 at pm  . SUMAtriptan (IMITREX) 50 MG tablet Take 50 mg by mouth daily as needed for migraine or headache. Maximum 2 doses in 2 days  0 couple weeks ago  . trifluridine (VIROPTIC) 1 % ophthalmic solution Place 1 drop into both eyes 2 (two) times daily as needed (breakouts (blurry, matted eyelids)).   3 weeks ago  . venlafaxine XR (EFFEXOR-XR) 37.5 MG 24 hr capsule Take 37.5 mg by mouth daily with breakfast.   04/04/2015  . warfarin (COUMADIN) 5 MG tablet Take 1  tablet (5 mg total) by mouth daily at 6 PM.     . colchicine 0.6 MG tablet Take 1 tablet (0.6 mg total) by mouth 2 (two) times daily. (Patient not taking: Reported on 04/07/2015) 14 tablet 0 Not Taking at Unknown time   ROS: General: no fevers/chills/night sweats, feels generally weak, complains of left arm pain since fall, presently immobilized and unable to get up. Skin rash present. No fever or chills over the  past 24 hours. Denies urinary disturbances. Still has pain in right legs and weakness right leg.  Physical Exam:  Temp(Src) 99.9 F (37.7 C) (Oral), HR 100/min, reg. RR 15/min. 126/76 mm Hg.  General appearance: alert, cooperative, appears stated age, no distress and moderately obese Lungs: clear to auscultation bilaterally Chest wall: no tenderness Heart: regular rate and rhythm, S1, S2 normal, no murmur, click, rub or gallop and Tachycardia present Abdomen: soft, non-tender; bowel sounds normal; no masses,  no organomegaly Extremities: Limited examination,surgical site right groin has healed well. 2+ edema right worse than the left trace edema. Pulses: Radial pulses are normal, pedal pulses absent right and feeble left. Normal capillary fill.. Neurologic: Grossly normal  Labs:   Lab Results  Component Value Date   WBC 10.0 05/08/2015   HGB 8.8* 05/08/2015   HCT 28.3* 05/08/2015   MCV 86.5 05/08/2015   PLT 348 05/08/2015     Recent Labs Lab 05/08/15 0640  NA 133*  K 3.9  CL 97*  CO2 27  BUN 6  CREATININE 0.89  CALCIUM 8.1*  PROT 6.7  BILITOT 0.8  ALKPHOS 113  ALT 14*  AST 30  GLUCOSE 116*    Lipid Panel     Component Value Date/Time   CHOL 152 11/16/2013 0420   TRIG 134 11/16/2013 0420   HDL 36* 11/16/2013 0420   CHOLHDL 4.2 11/16/2013 0420   VLDL 27 11/16/2013 0420   LDLCALC 89 11/16/2013 0420   HEMOGLOBIN A1C Lab Results  Component Value Date   HGBA1C 6.0* 07/15/2013   MPG 126* 07/15/2013    Cardiac Panel (last 3 results)  Recent Labs   03/01/15 1920 04/07/15 0008 04/07/15 0113 05/08/15 0640  CKTOTAL  --   --  495*  --   TROPONINI <0.03 <0.03  --  <0.03    Lab Results  Component Value Date   CKTOTAL 495* 04/07/2015   TROPONINI <0.03 05/08/2015     TSH  Recent Labs  04/14/15 0250  TSH 3.019    EKG: 04/12/2059 revealed sinus tachycardia otherwise normal, EKG from 04/14/2015 reveals episodes of atrial tachycardia with variable AV conduction. Ventricular rate of 130-140 bpm. EKG 05/08/2015: SVT, repeat EKG normal sinus rhythm/sinus tachycardia without ischemia  Scheduled Meds: . atorvastatin  80 mg Oral q1800  .  ceFAZolin (ANCEF) IV  2 g Intravenous Q8H  . docusate sodium  100 mg Oral BID  . flecainide  100 mg Oral Q12H  . metoprolol tartrate  75 mg Oral BID  . morphine  90 mg Oral Q12H  . naloxegol oxalate  25 mg Oral QAC breakfast  . oxyCODONE  30 mg Oral 4 times per day  . polyethylene glycol  17 g Oral Daily  . sodium chloride  3 mL Intravenous Q12H  . sodium chloride  3 mL Intravenous Q12H  . sodium phosphate  1 enema Rectal Once  . venlafaxine XR  75 mg Oral Q breakfast  . verapamil  5 mg Intravenous Once  . warfarin  7.5 mg Oral ONCE-1800  . Warfarin - Pharmacist Dosing Inpatient   Does not apply q1800   Continuous Infusions: . sodium chloride     PRN Meds:.sodium chloride, acetaminophen **OR** acetaminophen, albuterol, clonazePAM, famciclovir, ondansetron, oxyCODONE-acetaminophen, sodium chloride, SUMAtriptan, trifluridine  ASSESSMENT AND PLAN:  1. Atrial tachycardia with variable AV conduction, converted to sinus tachycardia with 6 mg of intravenous and innocent on 04/15/2015. No  Recurrence. 2. 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 2. 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  Recommendation: Patient is converted back sinus rhythm, sinus tachycardia.  Patient has had recurrent episodes of PSVT, normal echocardiogram previously, no ischemic EKG  changes in spite of significant tachycardia, hence I have started him on flecainide 100 mg by mouth twice a day.  We will repeat EKG in the morning.  If he remains stable, he can be transferred back to rehabilitation again. There is no atrial fibrillation.  Yates Decamp, MD 05/08/2015, 10:12 AM [facility] [place] Pager: [phone] Office: [phone] If no answer Cell [phone]

## 2015-05-08 NOTE — Consult Note (Signed)
[facility]  Total days of antibiotics 30        Day 9/10-9/13 vancomycin, zosyn        Day 9/14-10/11 ancef        Day 10/12 zosyn       Reason for Consult: Recurrent fever, SVT, in setting of ongoing MSSA treatment    Referring Physician: [facility]  Principal Problem:   SVT (supraventricular tachycardia) (HCC) Active Problems:   Chronic use of opiate drugs therapeutic purposes   Bacteremia due to Staphylococcus aureus   Aneurysm of right femoral artery (HCC)   DVT, lower extremity (HCC)   Sepsis (HCC)   HPI: [name] is a 59 y.o. 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 Was treated with Ancef for MSSA since 9/14. He underwent serial repairs and debridement over the R thigh aneurysm last being an abdominus rectus tissue graft on 9/23. Drainage of LUE abscess on 9/27. He was discharged to CIR in good condition but returned to hospital after becoming febrile to 100.7 with recurrent SVT to a rate of 167.  Past Medical History  Diagnosis Date  . Hypertension   . Chronic knee pain   . Hyperlipemia   . PUD (peptic ulcer disease)     Allergies:  Allergies  Allergen Reactions  . Imitrex [Sumatriptan] Other (See Comments)    Chest pain, 2004 (tolerates 50 mg prn)  . Verapamil Other (See Comments)    Causes pvc's    Current antibiotics:   MEDICATIONS: . atorvastatin  80 mg Oral q1800  . collagenase   Topical Daily  . docusate sodium  100 mg Oral BID  . flecainide  100 mg Oral Q12H  . metoprolol tartrate  75 mg Oral BID  . morphine  90 mg Oral Q12H  . naloxegol oxalate  25 mg Oral QAC breakfast  . oxyCODONE  30 mg Oral 4 times per day  . piperacillin-tazobactam  (ZOSYN)  IV  3.375 g Intravenous Q8H  . polyethylene glycol  17 g Oral Daily  . sodium chloride  3 mL Intravenous Q12H  . sodium chloride  3 mL Intravenous Q12H  . sodium phosphate  1 enema Rectal Once  . venlafaxine XR  75 mg Oral Q breakfast  . Warfarin - Pharmacist Dosing Inpatient   Does not apply q1800    Social History  Substance Use Topics  . Smoking status: Former Smoker    Types: Cigarettes  . Smokeless tobacco: Not on file  . Alcohol Use: No    Family History  Problem Relation Age of Onset  . Heart attack Paternal Grandmother 77  . Heart disease Mother 84    arrythmia  . Stroke Paternal Grandfather   . Stroke Maternal Grandfather     Review of Systems  Constitutional: Positive for fever.  Eyes: Negative for blurred vision.  Respiratory: Negative for shortness of breath.   Cardiovascular: Positive for leg swelling. Negative for chest pain and palpitations.  Gastrointestinal: Positive for heartburn and abdominal pain. Negative for nausea, diarrhea and blood in stool.  Genitourinary: Negative for dysuria.  Musculoskeletal: Positive for myalgias and joint pain. Negative for falls.  Skin: Negative for rash.  Neurological: Positive for weakness. Negative for dizziness and  headaches.  Psychiatric/Behavioral: The patient does not have insomnia.     OBJECTIVE: Temp:  [98.6 F (37 C)-100.7 F (38.2 C)] 98.7 F (37.1 C) (10/12 1600) Pulse Rate:  [94-175] 99 (10/12 1600) Resp:  [9-28] 16 (10/12 1704) BP: (92-153)/(55-111) 153/82 mmHg (10/12 1704) SpO2:  [95 %-100 %] 95 % (10/12 1600)  GENERAL- alert, co-operative, NAD HEENT- Atraumatic, oral mucosa appears moist, good and intact dentition, no cervical LN enlargement. CARDIAC- RRR, no murmurs, rubs or gallops. RESP- CTAB, no wheezes or crackles. ABDOMEN- Soft, nontender, vertical incision closed, nonerythematous, appears well healing NEURO- strength upper and lower extremities- 5/5, Sensation intact-  globally EXTREMITIES- RLE has thigh surgical site, wound base is red, healthy appearing tissue 2+ pitting edema up to knee R toes include small areas of dry gangrene on and 5 digits, mostly on great toe, with small areas <1cm on first 2 toes appearing purulent, sensation is intact, fair degree of skin peeling with healthy pink tissue underneath most tissue PSYCH- Normal mood and affect, appropriate thought content and speech.  LABS: Results for orders placed or performed during the hospital encounter of 05/08/15 (from the past 48 hour(s))  MRSA PCR Screening     Status: None   Collection Time: 05/08/15  9:08 AM  Result Value Ref Range   MRSA by PCR NEGATIVE NEGATIVE    Comment:        The GeneXpert MRSA Assay (FDA approved for NASAL specimens only), is one component of a comprehensive MRSA colonization surveillance program. It is not intended to diagnose MRSA infection nor to guide or monitor treatment for MRSA infections.   Troponin I (q 6hr x 3)     Status: None   Collection Time: 05/08/15 10:36 AM  Result Value Ref Range   Troponin I <0.03 <0.031 ng/mL    Comment:        NO INDICATION OF MYOCARDIAL INJURY.   Lactic acid, plasma     Status: None   Collection Time: 05/08/15 10:36 AM  Result Value Ref Range   Lactic Acid, Venous 0.9 0.5 - 2.0 mmol/L  Procalcitonin     Status: None   Collection Time: 05/08/15 10:36 AM  Result Value Ref Range   Procalcitonin <0.10 ng/mL    Comment:        Interpretation: PCT (Procalcitonin) <= 0.5 ng/mL: Systemic infection (sepsis) is not likely. Local bacterial infection is possible. (NOTE)         ICU PCT Algorithm               Non ICU PCT Algorithm    ----------------------------     ------------------------------         PCT < 0.25 ng/mL                 PCT < 0.1 ng/mL     Stopping of antibiotics            Stopping of antibiotics       strongly encouraged.               strongly encouraged.    ----------------------------      ------------------------------       PCT level decrease by               PCT < 0.25 ng/mL       >= 80% from peak PCT       OR PCT 0.25 - 0.5 ng/mL  Stopping of antibiotics                                             encouraged.     Stopping of antibiotics           encouraged.    ----------------------------     ------------------------------       PCT level decrease by              PCT >= 0.25 ng/mL       < 80% from peak PCT        AND PCT >= 0.5 ng/mL            Continuin g antibiotics                                              encouraged.       Continuing antibiotics            encouraged.    ----------------------------     ------------------------------     PCT level increase compared          PCT > 0.5 ng/mL         with peak PCT AND          PCT >= 0.5 ng/mL             Escalation of antibiotics                                          strongly encouraged.      Escalation of antibiotics        strongly encouraged.   Protime-INR     Status: Abnormal   Collection Time: 05/08/15 10:36 AM  Result Value Ref Range   Prothrombin Time 26.4 (H) 11.6 - 15.2 seconds   INR 2.47 (H) 0.00 - 1.49  APTT     Status: Abnormal   Collection Time: 05/08/15 10:36 AM  Result Value Ref Range   aPTT 57 (H) 24 - 37 seconds    Comment:        IF BASELINE aPTT IS ELEVATED, SUGGEST PATIENT RISK ASSESSMENT BE USED TO DETERMINE APPROPRIATE ANTICOAGULANT THERAPY.   Culture, blood (routine x 2)     Status: None (Preliminary result)   Collection Time: 05/08/15 10:40 AM  Result Value Ref Range   Specimen Description BLOOD LEFT FINGER    Special Requests BOTTLES DRAWN AEROBIC AND ANAEROBIC 10CC    Culture PENDING    Report Status PENDING     MICRO:  IMAGING: Dg Chest Port 1 View  05/08/2015  CLINICAL DATA:  Sepsis. EXAM: PORTABLE CHEST 1 VIEW COMPARISON:  April 26, 2015. FINDINGS: The heart size and mediastinal contours are within normal limits. Both lungs are clear. No  pneumothorax or pleural effusion is noted. Right-sided PICC line is stable in position. The visualized skeletal structures are unremarkable. IMPRESSION: No acute cardiopulmonary abnormality seen. Electronically Signed   By: Lupita Raider, M.D.   On: 05/08/2015 09:09   Dg Abd Portable 1v  05/08/2015  CLINICAL DATA:  Abdominal pain this morning. History of constipation. Pressure. EXAM: PORTABLE ABDOMEN - 1 VIEW COMPARISON:  Chest 04/26/2015 FINDINGS: Stool-filled colon with scattered gas. No small or large bowel distention. No radiopaque stones. Surgical clips in the right abdomen. Visualized bones appear intact. IMPRESSION: Normal nonobstructive bowel gas pattern with diffusely stool-filled colon. Findings may indicate constipation. Electronically Signed   By: Burman NievesWilliam  Stevens M.D.   On: 05/08/2015 06:34    HISTORICAL MICRO/IMAGING  Assessment/Plan:  Patient on IV antibiotics for 30 days now, intiially vancomycin + zosyn then ancef for 4 weeks. Blood cultures have cleared but he continued to have intermittent fevers up to 100.7 from 9/29-10/7. SVT recurrence on 10/12 may have been secondary to fever of 100.7, and his pain at that time. Converted to NSR and cardiology has started on flecanide with some suspicion of re-entrant atrial rhythm as a factor. Seems unlikely that bacteremia is recurring with new resistance with known MSSA. His thigh, arm, and abdominal site look good. IV access looks good. Foot has some areas that could be harboring reservoir of bacteria, agree with chemical debridement at this time though as most tissue looks viable. No evidence of deep structures involvement.  -Continue wound care, including santyl/chemical debridement of gangrenous sites in particular -Should not need broadening of abx, recommend resuming Ancef IV treatment -Will follow up blood cultures    Pincus Badderhris W Tuwanda Vokes Internal Medicine Resident PGY-I Pager: [phone]  05/08/2015, 6:42 PM

## 2015-05-08 NOTE — Progress Notes (Signed)
Report called to cardiac unit, RRT nurses with patient pending transport.

## 2015-05-08 NOTE — H&P (Signed)
[facility] History and Physical                                                                                    Zia Kanner, is a 59 y.o. male  MRN: [id number]   DOB - 10-10-1955  Admit Date - 05/08/2015  Outpatient Primary MD for the patient is [name], MD  Referring Physician:  [facility]  Chief Complaint:  SVT   HPI  Waleed Dettman  is a 59 y.o. male, with HTN, HLD, and chronic pain developed SVT this am (10/12) and was re-admitted to stepdown. Mr. Wingert was initially admitted on 04/07/2015 workup revealed MSSA bacteremia, UTI, acute kidney injury, right superficial artery aneurysm and right lower extremity DVT. During his hospitalization he had multiple episodes of SVT and required adenosine therapy. He was placed on metoprolol 75 mg twice a day. He was prescribed 6 weeks of cefazolin for bacteremia for MSSA, his right femoral artery aneurysm was oversewn by vascular surgery, and he was discharged to [facility] inpatient rehabilitation on Coumadin on October 4.  He has been doing fairly well in rehabilitation, but this morning was noted to have a temperature of 100.7, pulse rate 168 and respiratory rate of 28 thus qualifying him as having SIRS and SVT. The patient denies chest pain and SOB.  He complains of left sided stomach pain that started yesterday, he also mentions that his right foot has remained swollen due to gangrene.  On initial exam by Dr. Katrinka Blazing and myself the patient had a pulse rate of 168 and received adenosine.  His pulse slowed to 120 and later stabilized at 94 after his lopressor dose was given.  Right foot is wrapped in gauze but his great toe is swollen, wet, erythematous with black covering the plantar portion.  I believe this is a likely source of current sepsis.  Review of Systems   In addition to the HPI above,  No chills, No Headache, No changes with Vision or hearing, No problems swallowing food or Liquids, No Chest pain, Cough or Shortness of  Breath, No Blood in stool or Urine, No dysuria, No new skin rashes or bruises, No new joints pains-aches,  ++ ongoing pain of left upper ext. No new weakness, tingling, numbness in any extremity, No recent weight gain or loss, A full 10 point Review of Systems was done, except as stated above, all other Review of Systems were negative.  Past Medical History  Past Medical History  Diagnosis Date  . Hypertension   . Chronic knee pain   . Hyperlipemia   . PUD (peptic ulcer disease)     Past Surgical History  Procedure Laterality Date  . I&d extremity Left 07/12/2013    Procedure: I&D Left Thigh Abscess;  Surgeon: [name], MD;  Location: [facility] OR;  Service: Orthopedics;  Laterality: Left;  . Replacement total knee bilateral    . Tonsillectomy    . Hernia repair    . Cholecystectomy    . Appendectomy    . Shoulder surgery    . Elbow surgery    . Femoral artery exploration Right 04/11/2015    Procedure: Resection  of infected right femoral artery aneurysm;  Surgeon: Fransisco Hertz, MD;  Location: [facility] OR;  Service: Vascular;  Laterality: Right;  . Incision and drainage abscess Right 04/11/2015    Procedure: INCISION AND DRAINAGE OF RIGHT THIGH ABSCESS;  Surgeon: Fransisco Hertz, MD;  Location: [facility] OR;  Service: Vascular;  Laterality: Right;  . Incision and drainage abscess Right 04/13/2015    Procedure: INCISION AND DRAINAGE THIGH ABSCESS;  Surgeon: Fransisco Hertz, MD;  Location: [facility] OR;  Service: Vascular;  Laterality: Right;  . I&d extremity Left 04/17/2015    Procedure: IRRIGATION AND DEBRIDEMENT LEFT EXTREMITY;  Surgeon: Fransisco Hertz, MD;  Location: [facility] OR;  Service: Vascular;  Laterality: Left;  Marland Kitchen Muscle flap closure Right 04/19/2015    Procedure: RIGHT RECTUS ABDOMINUS  FLAP TO RIGHT GROIN;  Surgeon: Glenna Fellows, MD;  Location: [facility] OR;  Service: Plastics;  Laterality: Right;  . Femoral revision Right 04/19/2015    Procedure: OVERSEW RIGHT FEMORAL VEIN ;  Surgeon: Chuck Hint, MD;   Location: [facility] OR;  Service: Vascular;  Laterality: Right;  . Wound exploration Right 04/23/2015    Procedure: EXAM UNDER ANESTHESIA AND VAC CHANGE RIGHT THIGH;  Surgeon: Glenna Fellows, MD;  Location: [facility] OR;  Service: Plastics;  Laterality: Right;      Social History Social History  Substance Use Topics  . Smoking status: Former Smoker    Types: Cigarettes  . Smokeless tobacco: Not on file  . Alcohol Use: No    Family History Family History  Problem Relation Age of Onset  . Heart attack Paternal Grandmother 77  . Heart disease Mother 68    arrythmia  . Stroke Paternal Grandfather   . Stroke Maternal Grandfather     Prior to Admission medications   Medication Sig Start Date End Date Taking? Authorizing Provider  albuterol (PROAIR HFA) 108 (90 BASE) MCG/ACT inhaler Inhale 1 puff into the lungs every 6 (six) hours as needed for wheezing or shortness of breath.  09/06/13   Historical Provider, MD  atorvastatin (LIPITOR) 80 MG tablet Take 80 mg by mouth daily.    Historical Provider, MD  ceFAZolin (ANCEF) 2-3 GM-% SOLR Inject 50 mLs (2 g total) into the vein every 8 (eight) hours. 04/30/15   Clydia Llano, MD  clonazePAM (KLONOPIN) 0.5 MG tablet Take 4 tablets (2 mg total) by mouth 2 (two) times daily as needed for anxiety. 04/30/15   Clydia Llano, MD  colchicine 0.6 MG tablet Take 1 tablet (0.6 mg total) by mouth 2 (two) times daily. Patient not taking: Reported on 04/07/2015 02/25/15   Ames Dura, MD  famciclovir [facility]) 500 MG tablet Take 500 mg by mouth 2 (two) times daily as needed (fever blisters).  10/25/13   Historical Provider, MD  Metoprolol Tartrate 75 MG TABS Take 75 mg by mouth 2 (two) times daily. 04/30/15   Clydia Llano, MD  morphine (MS CONTIN) 30 MG 12 hr tablet Take 30 mg by mouth every 12 (twelve) hours. Take with a 60 mg tablet for a 90 mg dose 02/11/15   Historical Provider, MD  morphine (MS CONTIN) 60 MG 12 hr tablet Take 60 mg by mouth every 12 (twelve) hours. Take  with a 30 mg tablet for a 90 mg dose 02/11/15   Historical Provider, MD  naloxegol oxalate (MOVANTIK) 25 MG TABS tablet Take 25 mg by mouth 2 (two) times daily. 09/18/14   Historical Provider, MD  ondansetron (ZOFRAN) 8 MG tablet Take 8 mg  by mouth daily as needed for nausea or vomiting.  12/25/14   Historical Provider, MD  OVER THE COUNTER MEDICATION Place 1 drop into both eyes 2 (two) times daily as needed (dry eyes). OTC lubricant eye drops    Historical Provider, MD  oxycodone (ROXICODONE) 30 MG immediate release tablet Take 30 mg by mouth every 6 (six) hours. scheduled    Historical Provider, MD  SUMAtriptan (IMITREX) 50 MG tablet Take 50 mg by mouth daily as needed for migraine or headache. Maximum 2 doses in 2 days 02/15/15   Historical Provider, MD  trifluridine (VIROPTIC) 1 % ophthalmic solution Place 1 drop into both eyes 2 (two) times daily as needed (breakouts (blurry, matted eyelids)).    Historical Provider, MD  venlafaxine XR (EFFEXOR-XR) 37.5 MG 24 hr capsule Take 37.5 mg by mouth daily with breakfast.    Historical Provider, MD  warfarin (COUMADIN) 5 MG tablet Take 1 tablet (5 mg total) by mouth daily at 6 PM. 04/30/15   Clydia Llano, MD    Allergies  Allergen Reactions  . Imitrex [Sumatriptan] Other (See Comments)    Chest pain, 2004 (tolerates 50 mg prn)  . Verapamil Other (See Comments)    Causes pvc's    Physical Exam  Vitals  Filed Vitals:   05/08/15 0829  Temp: 99.9 F (37.7 C)     General: Wd, Wn male, NAD, lying in bed  Psych:  Normal affect and insight, Not Suicidal or Homicidal, Awake Alert, Oriented X 3.  Neuro:   No F.N deficits, ALL C.Nerves Intact, Strength 5/5 all 4 extremities, Sensation intact all 4 extremities.  ENT:  Ears and Eyes appear Normal, Conjunctivae clear, PER. Moist oral mucosa without erythema or exudates.  Neck:  Supple, No lymphadenopathy appreciated  Respiratory:  Symmetrical chest wall movement, Good air movement bilaterally,  CTAB.  Cardiac:  Tachycardic, No Murmurs, +rapid JVP   Abdomen:  Positive bowel sounds, Soft, mild tenderness to palpation on the RLE, Non distended,  No masses appreciated  Skin:  No Cyanosis, Normal Skin Turgor, No Skin Rash or Bruise.  Extremities:  Able to move all 4. 5/5 strength in each,  no effusions. Left upper extremity in wrist splint.  RLE bandaged. 2+ pitting ankle edema, great toe swollen, wet, red, with black on the plantar portion.    Data Review  Wt Readings from Last 3 Encounters:  05/01/15 122.925 kg (271 lb)  04/06/15 103.42 kg (228 lb)  03/31/15 106.595 kg (235 lb)    CBC  Recent Labs Lab 05/04/15 0951 05/07/15 0510 05/08/15 0640  WBC 9.3 7.4 10.0  HGB 8.9* 8.1* 8.8*  HCT 29.4* 26.4* 28.3*  PLT 417* 356 348  MCV 88.0 86.6 86.5  MCH 26.6 26.6 26.9  MCHC 30.3 30.7 31.1  RDW 15.9* 16.1* 16.1*  LYMPHSABS  --   --  3.5  MONOABS  --   --  1.2*  EOSABS  --   --  0.1  BASOSABS  --   --  0.1    Chemistries   Recent Labs Lab 05/08/15 0640  NA 133*  K 3.9  CL 97*  CO2 27  GLUCOSE 116*  BUN 6  CREATININE 0.89  CALCIUM 8.1*  AST 30  ALT 14*  ALKPHOS 113  BILITOT 0.8     Coagulation profile  Recent Labs Lab 05/04/15 0546 05/05/15 0607 05/06/15 0517 05/07/15 0510 05/08/15 0515  INR 2.31* 2.06* 1.84* 2.04* 2.38*    Cardiac Enzymes  Recent Labs Lab  05/08/15 0640  TROPONINI <0.03     Urinalysis    Component Value Date/Time   COLORURINE YELLOW 05/03/2015 1812   APPEARANCEUR CLEAR 05/03/2015 1812   LABSPEC 1.015 05/03/2015 1812   PHURINE 7.0 05/03/2015 1812   GLUCOSEU NEGATIVE 05/03/2015 1812   HGBUR LARGE* 05/03/2015 1812   BILIRUBINUR NEGATIVE 05/03/2015 1812   KETONESUR NEGATIVE 05/03/2015 1812   PROTEINUR 30* 05/03/2015 1812   UROBILINOGEN 1.0 05/03/2015 1812   NITRITE NEGATIVE 05/03/2015 1812   LEUKOCYTESUR NEGATIVE 05/03/2015 1812    Anti-infectives    Start     Dose/Rate Route Frequency Ordered Stop   05/08/15  1600  ceFAZolin (ANCEF) IVPB 2 g/50 mL premix  Status:  Discontinued     2 g 100 mL/hr over 30 Minutes Intravenous Every 8 hours 05/08/15 0822 05/08/15 1016   05/08/15 1100  piperacillin-tazobactam (ZOSYN) IVPB 3.375 g     3.375 g 12.5 mL/hr over 240 Minutes Intravenous Every 8 hours 05/08/15 1019     05/08/15 0850  famciclovir (FAMVIR) tablet 500 mg     500 mg Oral 2 times daily PRN 05/08/15 0824         Imaging results:   Ct Femur Right Wo Contrast  04/08/2015  CLINICAL DATA:  Pain post fall, right femoral aneurysm likely causing thromboembolism to his right lower extremity. Other active problems: Sepsis, UTI, ARF coagulopathy, left radial fracture. EXAM: CT OF THE RIGHT FEMUR WITHOUT CONTRAST TECHNIQUE: Multidetector CT imaging was performed according to the standard protocol. Multiplanar CT image reconstructions were also generated. COMPARISON:  None. FINDINGS: 7.4 cm nearly isoechoic mass deep to the sartorius in the proximal thigh, along the course of the proximal SFA, which is not identified separately at the level of the process. Prominent right inguinal adenopathy up to 13 mm short axis diameter. Changes of knee hemiarthroplasty. Degenerative spurring in the lateral compartment of the knee. Small effusion in the suprapatellar bursa. No fracture or other acute bone abnormality. IMPRESSION: 1. 7.4 cm process involving or adjacent to the proximal right SFA. Considerations include aneurysm, pseudoaneurysm, hematoma, mass. Electronically Signed   By: Corlis Leak M.D.   On: 04/08/2015 12:54   Dg Chest Port 1 View  04/26/2015  CLINICAL DATA:  Confirm line placement EXAM: PORTABLE CHEST 1 VIEW COMPARISON:  April 20, 2015 FINDINGS: The heart size and mediastinal contours are stable. Right central venous line is identified with distal tip in the superior vena cava right atrial junction. Left central venous line is identified distal tip in superior vena cava unchanged. There is no pneumothorax.  The lungs are clear. The heart size is enlarged. The visualized skeletal structures are stable. IMPRESSION: Right central venous line distal tip in the superior vena cava right atrial junction. There is no pneumothorax. Electronically Signed   By: Sherian Rein M.D.   On: 04/26/2015 18:30   Dg Chest Port 1 View  04/20/2015  CLINICAL DATA:  Central line placement EXAM: PORTABLE CHEST 1 VIEW COMPARISON:  04/14/2015 FINDINGS: Left IJ venous catheter with its tip at the junction of the left brachiocephalic vein and SVC. Visualized lungs are clear.  No pneumothorax. Bilateral lung bases are excluded. The visualized heart is unremarkable. IMPRESSION: Left IJ venous catheter with its tip at the junction of the left brachiocephalic vein and SVC. Electronically Signed   By: Charline Bills M.D.   On: 04/20/2015 10:07   Dg Chest Portable 1 View  04/14/2015  CLINICAL DATA:  Encounter for central line placement EXAM: PORTABLE  CHEST - 1 VIEW COMPARISON:  04/07/2015 FINDINGS: Cardiomediastinal silhouette is unremarkable. No acute infiltrate or pleural effusion. No pulmonary edema. There is left IJ central line with tip in SVC. No pneumothorax. IMPRESSION: Left IJ central line with tip in SVC.  No pneumothorax. Electronically Signed   By: Natasha Mead M.D.   On: 04/14/2015 16:17   Dg Abd Portable 1v  05/08/2015  CLINICAL DATA:  Abdominal pain this morning. History of constipation. Pressure. EXAM: PORTABLE ABDOMEN - 1 VIEW COMPARISON:  Chest 04/26/2015 FINDINGS: Stool-filled colon with scattered gas. No small or large bowel distention. No radiopaque stones. Surgical clips in the right abdomen. Visualized bones appear intact. IMPRESSION: Normal nonobstructive bowel gas pattern with diffusely stool-filled colon. Findings may indicate constipation. Electronically Signed   By: Burman Nieves M.D.   On: 05/08/2015 06:34    My personal review of EKG: Sinus Tach with RBBB   Assessment & Plan  Principal Problem:   SVT  (supraventricular tachycardia) (HCC) Active Problems:   Chronic use of opiate drugs therapeutic purposes   Bacteremia due to Staphylococcus aureus   Aneurysm of right femoral artery (HCC)   DVT, lower extremity (HCC)   Sepsis (HCC)   SVT Recurrent episodes.  168 this am.  Broken with adenosine.   Dr. Jacinto Halim consulted.  Will monitor the patient in stepdown.  Continue lopressor bid.   Patient is on coumadin for anticoagulation.  Will cycle troponin.   Initial EKG shows SVT with non specific ST abnormalities, subsequent EKG shows sinus tach with RBBB. Need to treat underlying cause.   Sepsis Blood cultures obtained, Lactic acid and pro calcitonin pending. Discussed with ID (Dr. Drue Second), he has been on Ancef for 4 weeks.  Will change to Zosyn today, and ID will consult. Source suspected to be right foot, but will check blood cultures, U/A and CXR   Gangrene of right lower extremity Right foot appears acutely infected.  We will place on Zosyn. Have asked [name] to re-round on the patient and make recommendations. Will defer imaging to [name].   Bacteremia MSSA.  Started Ancef 04/10/15.  It was recommended for 6 weeks.  Changing to Zosyn 10/12 due to sepsis.   Aneurysm of right femoral artery Over sewn by Vascular surgery during pervious hospitalization with multiple subsequent returns to the OR for cavity wash out. Currently stable.  Wound packed with gauze.  Needs follow up wound care to right groin.  Abdominal incision is closed and appears to be healing well     DVT lower ext Thromboembolism thought to be secondary to right femoral artery aneurysm. Patient is on Coumadin for anticoagulation.   Chronic pain Patient has had a steady chronic pain regimen for the past 20+ years which includes 90 mg of MS Contin twice a day and 30 mg of OxyIR every 6. We will continue these medications.   HTN Patient was hypotensive this morning during SVT episode. Now appears normotensive.  We'll continue metoprolol 75 mg twice a day.   HLD Continue atorvastatin.     Consultants Called:  Cardiology = Dr. Jacinto Halim, Infectious Disease = Dr. Drue Second, Orthopedic Surgery = [name]  Family Communication:   Patient is alert, orientated and understands their plan of care.  Code Status:  Full  Condition:  Guarded.  Potential Disposition: Return to CIR when stable.  Time spent in minutes : [address]   [facility] [place]is [place],  [place] on 05/08/2015 at 8:43 AM Between 7am to 7pm - Pager -  [phone] After 7pm go to [email or web address] - password [email or web address] And look for the night coverage person covering me after hours

## 2015-05-08 NOTE — Progress Notes (Deleted)
    Summary 59 y/o M transferred from rehab to 2H with SVT (130-175). 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.  Anticoagulation: VTE to R foot with dry gangrene. INR 2.38 on Coumadin. CBC stable.  Infectious Disease: 6 wks abs on Ancef. Famvir  Cardiovascular: SVT on Flecainide, metoprolol, verapamil  Endocrinology: colchicine  Gastrointestinal / Nutrition: PCM on Miralax, Movantik (opioid induced constipation)  Neurology: MS Contin, scheduled Oxy IR, Effexor XR  Nephrology: Scr 0.89  Pulmonary: RA  Hematology / Oncology: CBC stable  PTA Medication Issues  Best Practices  Plan:  Coumadin 7.5mg  po x 1 more day. May try alternating 7.5mg  and 5mg  daily. Daily INR

## 2015-05-08 NOTE — Discharge Summary (Signed)
Discharge summary job # [id number]

## 2015-05-08 NOTE — Plan of Care (Signed)
Problem: RH PAIN MANAGEMENT Goal: RH STG PAIN MANAGED AT OR BELOW PT'S PAIN GOAL <4  Outcome: Not Progressing Patient with pain @ least 7/10 scale requiring round the clock pain meds

## 2015-05-08 NOTE — Discharge Summary (Signed)
NAMEADEKUNLE, ROHRBACH                   ACCOUNT NO.:  [id number]  MEDICAL RECORD NO.:  [id number]  LOCATION:  4M13C                        FACILITY:  [facility]  PHYSICIAN:  Mariam Dollar, P.A.  DATE OF BIRTH:  1955/10/16  DATE OF ADMISSION:  04/30/2015 DATE OF DISCHARGE:  05/08/2015                              DISCHARGE SUMMARY   DISCHARGE DIAGNOSES: 1. Functional deficits secondary to debilitation, sepsis, methicillin-     resistant Staphylococcus aureus (MRSA) abscess. 2. Coumadin for thromboembolism. 3. Supraventricular tachycardia (SVT) with tachycardia. 4. Severe protein malnutrition. 5. Acute blood loss anemia.  HISTORY OF PRESENT ILLNESS:  This is a 59 year old right-handed male, history of hypertension, MRSA abscess, peripheral neuropathy, independent with occasional cane walker prior to admission.  Living in a home with his wife, one level with 4 steps to entry.  Presented on April 07, 2015, with recent fall, increasing left hand and wrist pain.  Became hypotensive, tachycardic in the ED.  White count 31,000 placed on broad-spectrum antibiotics.  An MRI of the left hand show dorsal distal radius transverse fracture.  Multiple small deep fluid collections in the hands compatible with hematoma questionable superimposed infection.  CT of the right femur due to pain showed a 7.4 cm process involving or adjacent to the proximal right SFA  question aneurysm pseudo aneurysm or hematoma.  Vascular Surgery consulted, underwent right thigh abscess incision and drainage of infected right superficial femoral artery aneurysm on April 11, 2015, per Dr. Imogene Burn. Follow up infectious disease.  Blood cultures positive bacillus MSSA, remained on broad-spectrum antibiotics.  Multiple returns to the OR for irrigation and debridements as well as rectus flap, procedure on April 19, 2015 per plastic surgery and wound VAC applied.  A TEE did not show any valvular insufficiency.  Hospital  course complicated by tachycardia, SVT, Cardiology Service is consulted, received adenosine. Echocardiogram with ejection fraction 60% grade I diastolic dysfunction. In regard to abscess of left dorsal hand, underwent incision and drainage per [name] on April 23, 2015.  Coumadin initiated for thromboembolism.  Physical and occupational therapy ongoing.  The patient was admitted for comprehensive rehab program.  PAST MEDICAL HISTORY:  See discharge diagnoses.  SOCIAL HISTORY:  Lives with spouse, independent with a cane prior to admission.  Functional status upon admission to rehab services was +2 physical assist sit to stand, +2 physical assist sit to supine, mod max assist activities of daily living.  PHYSICAL EXAMINATION:  VITAL SIGNS:  Blood pressure 163/73, pulse 96, temperature 99, and respirations 18. GENERAL:  Alert male in no acute distress. LUNGS:  Clear to auscultation. CARDIAC:  Rate controlled. ABDOMEN:  Soft, nontender.  Good bowel sounds. EXTREMITIES:  Right foot dressing in place.  Some dried drainage on the dressing.  Right lower extremity with VAC in place.  Left upper extremity wrist dressing.  REHABILITATION HOSPITAL COURSE:  The patient was admitted to inpatient rehab services with therapies initiated on a 3-hour daily basis, consisting of physical therapy, occupational therapy, and rehabilitation nursing.  The following issues were addressed during the patient's rehabilitation stay.  Pertaining to Mr. Lauderback debilitation sepsis, multi medical, he remained on Ancef 3 times a day  for a total 6 weeks followed by Infectious Disease.  He had undergone multiple irrigation and debridements with rectus flap procedure dressing changes as advised.  He remained on chronic Coumadin for thromboembolism, no bleeding episodes. Pain management with the use of MS Contin 90 mg twice daily and oxycodone for breakthrough pain.  Hospital rehab course complicated by tachycardia  SVT.  He remained on Lopressor 50 twice a day.  He had required adenosine while on the acute side of the hospital, he had been titrated to 75 mg twice daily.  The patient received weekly collaborative interdisciplinary team conferences to discuss estimated length of stay, family teaching, and any barriers to discharge.  All weightbearing restrictions as advised, using a rolling walker, propelling his wheelchair, mobility, and supervision.  Reviewed car transfers with the patient performing stand pivot simulated midsize SUV, moderate assist for pivot, but minimal assist to bring right lower extremity in and out of the car.  Activities of daily living and homemaking, he engaged in shower transfer, training with the use of tub transfer, bench and a room shower, required assistance to position wheelchair for proper positioning to increase safety and ease of transfers, utilizing lateral scoots.  Noted on the morning of May 08, 2015, the patient again with SVT heart rate in the 160s, blood pressure was soft 103/50, stat blood work was ordered pending. Cardiology Service is consulted for need for monitored bed.  The patient discharged to Acute Care Services under [facility] for ongoing medical care.  All issues in regard to medication changes were made as per medical team.  The patient's condition at the time of transfer was guarded.     Mariam Dollaraniel Lonell Stamos, P.A.     DA/MEDQ  D:  05/08/2015  T:  05/08/2015  Job:  [id number]  cc:   Pamella PertJagadeesh R. Ganji, MD Fransisco HertzBrian L Chen, MD Lacretia LeighJeffrey C. Ninetta LightsHatcher, M.D. Erick ColaceAndrew E. Kirsteins, M.D.

## 2015-05-08 NOTE — Progress Notes (Signed)
Orthopedic and Infectious disease in to see patient. Dressing to right foot changed with dry dressing great toe,second toe and last toe black. MD suggest that the tissue is pink above that area and that would care would help in the healing process. ID in to see patient and dressing to right thigh changed see note, area open with red and pink tissue. Orders to follow. Patient tolerated well.

## 2015-05-08 NOTE — Progress Notes (Signed)
Patient's HR per dinamap shows 148, complaining of abdominal pain when taking a deep breath. HR per pulse oximeter shows variable reading between 99- 150s. Patient denies chest pain. Apical pulse  upon auscultation has a rapid pattern followed by slow pauses. Patient was just given Oxycontin prn for right leg pain. Last bm was 10/10, passing flatus, abdomen soft with hypoactive bowel sounds.

## 2015-05-08 NOTE — Progress Notes (Signed)
Dialed Marissa NestlePam Love (on call), left voicemail message @ this time.

## 2015-05-08 NOTE — Progress Notes (Signed)
Spoke to Pam Love regarding [employer]'s HR, new orders received for stat EKG, cardiac enzymes, RRT nurse @ bedside.Marland Kitchen. KUB was done. Dan (PA) & Dr. Wynn BankerKirsteins evaluating patient @ this time.

## 2015-05-08 NOTE — Progress Notes (Signed)
Subjective/Complaints: Pt noted to have elevated HR this am.  Was laying in bed.  No CP or other pains, no sweats or chills, no nausea or vomiting.  Mild abd discomfort feels like he may have BM later  No tachycardia problems in therapy   ROS:  See above.  No new weakness  Objective: Vital Signs: Blood pressure 105/90, pulse 166, temperature 98.6 F (37 C), temperature source Oral, resp. rate 25, weight 122.925 kg (271 lb), SpO2 98 %. Dg Abd Portable 1v  05/08/2015  CLINICAL DATA:  Abdominal pain this morning. History of constipation. Pressure. EXAM: PORTABLE ABDOMEN - 1 VIEW COMPARISON:  Chest 04/26/2015 FINDINGS: Stool-filled colon with scattered gas. No small or large bowel distention. No radiopaque stones. Surgical clips in the right abdomen. Visualized bones appear intact. IMPRESSION: Normal nonobstructive bowel gas pattern with diffusely stool-filled colon. Findings may indicate constipation. Electronically Signed   By: Burman Nieves M.D.   On: 05/08/2015 06:34   Results for orders placed or performed during the hospital encounter of 04/30/15 (from the past 72 hour(s))  Protime-INR     Status: Abnormal   Collection Time: 05/06/15  5:17 AM  Result Value Ref Range   Prothrombin Time 21.2 (H) 11.6 - 15.2 seconds   INR 1.84 (H) 0.00 - 1.49  Protime-INR     Status: Abnormal   Collection Time: 05/07/15  5:10 AM  Result Value Ref Range   Prothrombin Time 22.9 (H) 11.6 - 15.2 seconds   INR 2.04 (H) 0.00 - 1.49  CBC     Status: Abnormal   Collection Time: 05/07/15  5:10 AM  Result Value Ref Range   WBC 7.4 4.0 - 10.5 K/uL   RBC 3.05 (L) 4.22 - 5.81 MIL/uL   Hemoglobin 8.1 (L) 13.0 - 17.0 g/dL   HCT 16.1 (L) 09.6 - 04.5 %   MCV 86.6 78.0 - 100.0 fL   MCH 26.6 26.0 - 34.0 pg   MCHC 30.7 30.0 - 36.0 g/dL   RDW 40.9 (H) 81.1 - 91.4 %   Platelets 356 150 - 400 K/uL  Protime-INR     Status: Abnormal   Collection Time: 05/08/15  5:15 AM  Result Value Ref Range   Prothrombin Time  25.7 (H) 11.6 - 15.2 seconds   INR 2.38 (H) 0.00 - 1.49     HEENT: normal Cardio: tachycardia and no murmur Resp: CTA B/L and unlabored GI: BS positive and NT, ND Extremity:  Pulses positive and No Edema Skin:   Breakdown Right foot with erythema and small areas of black eschar on great toe and Wound midline abd incision healing well staples removed Neuro: Alert/Oriented, Anxious, Abnormal Sensory reduced sensation to LT in left fingers and Right medial and ant thigh and right medial leg  Musc/Skel:  Other healed incision bilateral knee Gen NAD except complained of fatigue with minmal activity   Assessment/Plan: 1. 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 PA contacted DR Nadara Eaton who rec transfer to acute care hospitalist service Now with recurrent SVT.  Will need cardiac monitoring and adenosine  Back to CIR when stabilizied  FIM: Function - Bathing Position: Bed (LB in bed UB at sink) Body parts bathed by patient: Right arm, Left arm, Chest, Abdomen, Front perineal area, Buttocks, Right upper leg, Left upper leg Body parts bathed by helper: Right lower leg, Left lower leg Bathing not applicable: Buttocks, Right upper leg,  Left upper leg, Front perineal area, Right lower leg, Left lower leg (UB bathing only today) Assist Level: Touching or steadying assistance(Pt > 75%)  Function- Upper Body Dressing/Undressing What is the patient wearing?: Pull over shirt/dress Pull over shirt/dress - Perfomed by patient: Thread/unthread right sleeve, Thread/unthread left sleeve, Put head through opening, Pull shirt over trunk Assist Level: Set up Set up : To obtain clothing/put away Function - Lower Body Dressing/Undressing Lower body dressing/undressing activity did not occur: Safety/medical concerns What is the patient wearing?:  Pants Position: Bed Underwear - Performed by helper: Thread/unthread right underwear leg, Thread/unthread left underwear leg, Pull underwear up/down Pants- Performed by patient: Pull pants up/down, Thread/unthread right pants leg Pants- Performed by helper: Thread/unthread right pants leg Non-skid slipper socks- Performed by helper: Don/doff right sock, Don/doff left sock TED Hose - Performed by helper: Don/doff left TED hose (only one TED hose- on the left) Assist for footwear: Maximal assist (sock applied by helper - dependent) Assist for lower body dressing: Touching or steadying assistance (Pt > 75%)  Function - Toileting Toileting activity did not occur: N/A (no void, no bm, toileting did not occur) Toileting steps completed by helper: Adjust clothing prior to toileting, Performs perineal hygiene, Adjust clothing after toileting Toileting Assistive Devices: Grab bar or rail Medical Problem List and Plan: 1. 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  DVT Prophylaxis/Anticoagulation: Coumadin for thromboembolism. Monitor for any bleeding episodes 3. Pain Management: MS Contin 90 mg twice a day, oxycodone for breakthrough pain. Monitor with increased mobility, at this point not much nerve pain in RLE 4. Mood: Effexor XR 75 mg daily, Klonopin 0.5 mg 4 times a day as needed 5. Neuropsych: This patient is capable of making decisions on his own behalf. 6. Skin/Wound Care: Skin care is supervised to right foot. 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 Lajoyce Cornersuda, still in process of demarcation 7. Fluids/Electrolytes/Nutrition: Strict I and O's with follow-up chemistries 8.ID. Presently on Ancef 2 g 3 times a day. ID rec total 6 wks, UCx showed no growth so no changes needed 9. Tachycardia  SVT. Follow-up cardiology services. On lopressor 50mg  BID but has required adenosine on acute care 10. Constipation. Laxitive  assistance,  11.  Severe protein malnutrition, alb 1.6- supplement with beneprotein, enc hi protein diet choices 12.  Urinary retention- starting to void after foley removal  13.  ABLA- Hgb stable ~8 Function - Toilet Transfers Toilet transfer assistive device: Drop arm commode, Sliding board, Walker Assist level to toilet:  (Sliding board to toilet) Assist level from toilet:  (Stand pivot PFRW) Assist level to bedside commode (at bedside): Touching or steadying assistance (Pt > 75%) (Sliding Board) Assist level from bedside commode (at bedside): Moderate assist (Pt 50 - 74%/lift or lower) (Stand pivot with PFRW)  Function - Chair/bed transfer Chair/bed transfer method: Stand pivot Chair/bed transfer assist level: Moderate assist (Pt 50 - 74%/lift or lower) Chair/bed transfer assistive device: Armrests, Walker Chair/bed transfer details: Verbal cues for technique, Manual facilitation for placement, Manual facilitation for weight shifting, Verbal cues for safe use of DME/AE, Verbal cues for precautions/safety  Function - Locomotion: Wheelchair Will patient use wheelchair at discharge?: Yes Type: Manual Max wheelchair distance: 100 Assist Level: Supervision or verbal cues Wheel 50 feet with 2 turns activity did not occur: Safety/medical concerns Assist Level: Supervision or verbal cues Wheel  150 feet activity did not occur: Safety/medical concerns Assist Level: Supervision or verbal cues Turns around,maneuvers to table,bed, and toilet,negotiates 3% grade,maneuvers on rugs and over doorsills: No Function - Locomotion: Ambulation Assistive device: Walker-rolling Max distance: 12 Assist level: Moderate assist (Pt 50 - 74%) Walk 10 feet activity did not occur: Safety/medical concerns Assist level: Moderate assist (Pt 50 - 74%) Walk 50 feet with 2 turns activity  did not occur: Safety/medical concerns Walk 150 feet activity did not occur: Safety/medical concerns Walk 10 feet on uneven surfaces activity did not occur: Safety/medical concerns  Function - Comprehension Comprehension: Auditory Comprehension assist level: Follows complex conversation/direction with extra time/assistive device  Function - Expression Expression: Verbal Expression assist level: Expresses complex ideas: With extra time/assistive device  Function - Social Interaction Social Interaction assist level: Interacts appropriately with others with medication or extra time (anti-anxiety, antidepressant).  Function - Problem Solving Problem solving assist level: Solves basic problems with no assist  Function - Memory Memory assist level: Recognizes or recalls 90% of the time/requires cueing < 10% of the time Patient normally able to recall (first 3 days only): Current season, Location of own room, Staff names and faces, That he or she is in a hospital    LOS (Days) 8 A FACE TO FACE EVALUATION WAS PERFORMED  [name] E 05/08/2015, 6:38 AM

## 2015-05-08 NOTE — Progress Notes (Signed)
Patient ID: Craig MunchJimmy F [name], male   DOB: Jan 07, 1956, 59 y.o.   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 Examination there is no ascending cellulitis there is no purulent abscess. Patient has dry gangrenous changes to the toes worse for the great toe which is improving on the lesser toes.  I will place orders for Santyl dressing changes to the black eschar areas right foot daily dressing changes.  I feel it's better to wait as the dry gangrenous changes continue to resolve the patient may require very minimal amputation surgery. I will follow-up as an outpatient.

## 2015-05-08 NOTE — Progress Notes (Addendum)
Call received per floor RN at 0545 regarding Pt with elevated HR. Pam Love P.A called and EKG ordered, advised RN to call RRT. Pt seen at bedside 0555. Pt found resting in bed, no apparent distress. Alert oriented X 4. EKG completed yield SVT. Pt placed on Zoll monitor. D. Anguilli P.A at bedside, updated on Pt status. Cardiologist consulted per P.A. [facility] consulted via flow manager. Dr. Pilar Grammes. [facility] to admit Pt to Step Down Unit. Dr. Katrinka BlazingSmith and Georgann HousekeeperMary Anne York NP from [facility] at bedside 0715. Adenosine 6mg  IVP at 0740. Pt tolerated adenosine well, minimal SOB, placed on 2 LNC Po2 98-100%.  2H RN given report per floor RN, Pt transfered to 2 H at 0800 with RRT RN at bedside. See flowsheet for VS.

## 2015-05-08 NOTE — Progress Notes (Signed)
Social Work  Discharge Note  The overall goal for the admission was met for:   Discharge location: No - pt transferred back to acute unexpectedly  Length of Stay: No - 8 days  Discharge activity level: No - goals not met due to unexpected return to acute  Home/community participation: No  Services provided included: MD, RD, PT, OT, RN, Pharmacy, Neuropsych and SW  Financial Services: Medicaid  Follow-up services arranged: Other: none at this time due to return to acute hospital  Comments (or additional information):  Pt returned to acute unexpectedly this morning, but was working hard in therapies and will need to return to CIR to continue rehabilitation toward his goals.  Pt was targeted for d/c on 05/21/15, so he will need more time on CIR to reach his goals, which were supervision - minimal assistance.  CSW spoke with therapists and Dr. Letta Pate and team is in agreement that pt would be a good candidate to return to CIR.  CSW made Karene Fry, Admissions Coordinator aware of the above, as well.  Patient/Family verbalized understanding of follow-up arrangements: Other : No opportunity to talk with pt/wife due to transfer back to acute.  CSW is available as needed.  Individual responsible for coordination of the follow-up plan: Genie will continue to follow pt in acute setting for return to CIR when pt is medically stable and if pt is willing to return.  Confirmed correct DME delivered: Trey Sailors 05/08/2015    Cobe Viney, Silvestre Mesi

## 2015-05-09 ENCOUNTER — Encounter (HOSPITAL_COMMUNITY): Payer: Self-pay | Admitting: *Deleted

## 2015-05-09 DIAGNOSIS — E876 Hypokalemia: Secondary | ICD-10-CM

## 2015-05-09 DIAGNOSIS — N179 Acute kidney failure, unspecified: Secondary | ICD-10-CM

## 2015-05-09 DIAGNOSIS — I96 Gangrene, not elsewhere classified: Secondary | ICD-10-CM | POA: Diagnosis present

## 2015-05-09 DIAGNOSIS — S62102A Fracture of unspecified carpal bone, left wrist, initial encounter for closed fracture: Secondary | ICD-10-CM

## 2015-05-09 DIAGNOSIS — I824Z1 Acute embolism and thrombosis of unspecified deep veins of right distal lower extremity: Secondary | ICD-10-CM

## 2015-05-09 DIAGNOSIS — I724 Aneurysm of artery of lower extremity: Secondary | ICD-10-CM

## 2015-05-09 DIAGNOSIS — M71032 Abscess of bursa, left wrist: Secondary | ICD-10-CM | POA: Insufficient documentation

## 2015-05-09 DIAGNOSIS — R7881 Bacteremia: Secondary | ICD-10-CM

## 2015-05-09 DIAGNOSIS — B9561 Methicillin susceptible Staphylococcus aureus infection as the cause of diseases classified elsewhere: Secondary | ICD-10-CM

## 2015-05-09 DIAGNOSIS — G894 Chronic pain syndrome: Secondary | ICD-10-CM

## 2015-05-09 DIAGNOSIS — I272 Other secondary pulmonary hypertension: Secondary | ICD-10-CM

## 2015-05-09 DIAGNOSIS — I519 Heart disease, unspecified: Secondary | ICD-10-CM

## 2015-05-09 DIAGNOSIS — S91309A Unspecified open wound, unspecified foot, initial encounter: Secondary | ICD-10-CM | POA: Diagnosis present

## 2015-05-09 LAB — CBC
HCT: 28.2 % — ABNORMAL LOW (ref 39.0–52.0)
Hemoglobin: 8.4 g/dL — ABNORMAL LOW (ref 13.0–17.0)
MCH: 25.8 pg — ABNORMAL LOW (ref 26.0–34.0)
MCHC: 29.8 g/dL — AB (ref 30.0–36.0)
MCV: 86.8 fL (ref 78.0–100.0)
Platelets: 353 10*3/uL (ref 150–400)
RBC: 3.25 MIL/uL — ABNORMAL LOW (ref 4.22–5.81)
RDW: 16 % — AB (ref 11.5–15.5)
WBC: 6.6 10*3/uL (ref 4.0–10.5)

## 2015-05-09 LAB — COMPREHENSIVE METABOLIC PANEL
ALK PHOS: 98 U/L (ref 38–126)
ALT: 15 U/L — AB (ref 17–63)
ANION GAP: 9 (ref 5–15)
AST: 31 U/L (ref 15–41)
Albumin: 1.9 g/dL — ABNORMAL LOW (ref 3.5–5.0)
BILIRUBIN TOTAL: 0.4 mg/dL (ref 0.3–1.2)
BUN: 7 mg/dL (ref 6–20)
CALCIUM: 8.6 mg/dL — AB (ref 8.9–10.3)
CO2: 29 mmol/L (ref 22–32)
CREATININE: 0.91 mg/dL (ref 0.61–1.24)
Chloride: 99 mmol/L — ABNORMAL LOW (ref 101–111)
GFR calc non Af Amer: >60 mL/min (ref >60)
GLUCOSE: 105 mg/dL — AB (ref 65–99)
Potassium: 4 mmol/L (ref 3.5–5.1)
Sodium: 137 mmol/L (ref 135–145)
TOTAL PROTEIN: 7.1 g/dL (ref 6.5–8.1)

## 2015-05-09 LAB — URINE CULTURE: Culture: NO GROWTH

## 2015-05-09 LAB — PROTIME-INR
INR: 2.85 — ABNORMAL HIGH (ref 0.00–1.49)
Prothrombin Time: 29.4 seconds — ABNORMAL HIGH (ref 11.6–15.2)

## 2015-05-09 MED ORDER — WARFARIN SODIUM 5 MG PO TABS
5.0000 mg | ORAL_TABLET | Freq: Once | ORAL | Status: AC
  Administered 2015-05-09: 5 mg via ORAL
  Filled 2015-05-09: qty 1

## 2015-05-09 NOTE — Progress Notes (Signed)
[facility]    Date of Admission:  05/08/2015   Total days of antibiotics 31 Day 9/10-9/13 vancomycin, zosyn Day 9/14->> ancef  ID: [name] is a 59 y.o. male with [name] is a 59 y.o. 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 Was treated with Ancef for MSSA since 9/14. He underwent serial repairs and debridement over the R thigh aneurysm last being an abdominus rectus tissue graft on 9/23. Drainage of LUE abscess on 9/27. He was discharged to CIR in good condition but returned to hospital after becoming febrile to 100.7 with recurrent SVT.  Principal Problem:   SVT (supraventricular tachycardia) (HCC) Active Problems:   Chronic use of opiate drugs therapeutic purposes   Bacteremia due to Staphylococcus aureus   Aneurysm of right femoral artery (HCC)   DVT, lower extremity (HCC)   Sepsis (HCC)    Subjective: Patient afebrile, in NSR throughout the day. He denies any recurrence of abdominal pain. His right foot hurts but is controlled, and tolerated wound care evaluation today well. He is tolerating all medications without significant nausea or diarrhea.  Medications:  . atorvastatin  80 mg Oral q1800  .  ceFAZolin (ANCEF) IV  2 g Intravenous Q8H  . collagenase   Topical Daily  . docusate sodium  100 mg Oral BID  . flecainide  100 mg Oral Q12H  . metoprolol tartrate  75 mg Oral BID  . morphine  90 mg Oral Q12H  . naloxegol oxalate  25 mg Oral QAC breakfast  . oxyCODONE  30 mg Oral 4 times per day  . polyethylene  glycol  17 g Oral Daily  . sodium chloride  3 mL Intravenous Q12H  . sodium chloride  3 mL Intravenous Q12H  . sodium phosphate  1 enema Rectal Once  . venlafaxine XR  75 mg Oral Q breakfast  . warfarin  5 mg Oral ONCE-1800  . Warfarin - Pharmacist Dosing Inpatient   Does not apply q1800    Objective: Vital signs in last 24 hours: Temp:  [97.4 F (36.3 C)-99.4 F (37.4 C)] 98 F (36.7 C) (10/13 1641) Pulse Rate:  [82-111] 111 (10/13 1020) Resp:  [10-16] 10 (10/13 1641) BP: (109-164)/(69-127) 121/77 mmHg (10/13 1641) SpO2:  [96 %-98 %] 96 % (10/13 1641)    GENERAL- alert, co-operative, NAD HEENT- Atraumatic, oral mucosa appears moist CARDIAC- RRR, no murmurs, rubs or gallops. RESP- CTAB, no wheezes or crackles. ABDOMEN- Soft, nontender, vertical incision closed, nonerythematous, appears well healing EXTREMITIES- RLE wound dressings left intact during examination today, pitting pedal edema in RLE to knee PSYCH- Normal mood and affect, appropriate thought content and speech.  Lab Results  Recent Labs  05/08/15 0640 05/09/15 0428  WBC 10.0 6.6  HGB 8.8* 8.4*  HCT 28.3* 28.2*  NA 133* 137  K 3.9 4.0  CL 97* 99*  CO2 27 29  BUN 6 7  CREATININE 0.89 0.91   Liver Panel  Recent Labs  05/08/15 0640 05/09/15 0428  PROT 6.7 7.1  ALBUMIN 2.0* 1.9*  AST 30 31  ALT 14* 15*  ALKPHOS 113 98  BILITOT 0.8 0.4   Sedimentation Rate No results for input(s): ESRSEDRATE in the last 72 hours.  C-Reactive Protein No results for input(s): CRP in the last 72 hours.  Microbiology:  Studies/Results: Dg Chest Port 1 View  05/08/2015  CLINICAL DATA:  Sepsis. EXAM: PORTABLE CHEST 1 VIEW COMPARISON:  April 26, 2015. FINDINGS: The heart size and mediastinal contours are within normal limits. Both lungs are clear. No pneumothorax or pleural effusion is noted. Right-sided PICC line is stable in position. The visualized skeletal structures are unremarkable. IMPRESSION: No acute  cardiopulmonary abnormality seen. Electronically Signed   By: Lupita RaiderJames  Green Jr, M.D.   On: 05/08/2015 09:09   Dg Abd Portable 1v  05/08/2015  CLINICAL DATA:  Abdominal pain this morning. History of constipation. Pressure. EXAM: PORTABLE ABDOMEN - 1 VIEW COMPARISON:  Chest 04/26/2015 FINDINGS: Stool-filled colon with scattered gas. No small or large bowel distention. No radiopaque stones. Surgical clips in the right abdomen. Visualized bones appear intact. IMPRESSION: Normal nonobstructive bowel gas pattern with diffusely stool-filled colon. Findings may indicate constipation. Electronically Signed   By: Burman NievesWilliam  Stevens M.D.   On: 05/08/2015 06:34     Assessment/Plan: Patient doing well afebrile, with WBC of 6.6 today.  He will probably continue to benefit from gentle enzymatic debridement at the gangrenous areas of right foot.  Seems unlikely that bacteremia is recurring with new resistance with known MSSA. SVT recurrence may be resolved or improved with flecainide added to regimen. Continuing current abtx on Ancef out to 6wks since debridement is best plan for systemic therapy at this point. Can continue to follow up cultures but are unlikely to result positive and should not delay return to therapy/rehab.  Will sign off at this time, please contact ID service again as needed or further questions.   Pincus Badderhris W Allante Beane Internal Medicine Resident PGY-I Pager: [phone]  05/09/2015, 5:43 PM

## 2015-05-09 NOTE — Progress Notes (Signed)
[place] TEAM 1 - Stepdown/ICU TEAM Progress Note  KAYODE PETION JXB:[id number] DOB: 12/18/1955 DOA: 05/08/2015 PCP: [name], MD  Admit HPI / Brief Narrative: 59 y.o.WM PMHx PUD, HLD, history of MRSA abscesses, peripheral neuropathy, and chronic pain   On recent admission Presented to [facility] [place] [facility] with severe left hand pain. He was found to be septic with acute kidney injury and left hand cellulitis, right inguinal/thigh abscess from injecting testosterone . After admission he was noted to have a dusky swollen right foot. The patient reported that approximately 2 weeks prior he started feeling poorly and eating less. 9/7 he could not feel his right foot and fell as a result. He landed on his left wrist, shoulder, and back side. Since then his left hand has been very painful and his wrist and arm had begun to swell. He has developed thick erythematous streaking on his left arm. Patient with MSSA bacteremia, multiple debridement of right thigh/inguinal area and abscess in left wrist was discharged to CIR on 04/30/2015  On 10/12 while in CIR Patient developed SVT, TX with 6 mg of adenosine given at 7:30 AM with the rapid response team at bedside. Patient heart rates at that time were in the 160s to 170s and following administration of adenosine decreased down to the 130s  HPI/Subjective: 10/13 afebrile overnight, A/O 4, S/P Rt thigh debridement, and Plastic surgery intervention with wound VAC in place. S/P left hand I&D.Negative CP, negative SOB. 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 (orthopedic surgery) recommended compression stockings and follow-up as an outpatient. Conservative treatment for now. -Most likely will lose first and second metatarsal right foot    RLE DVT -Continue Coumadin per pharmacy  Left hand radius fracture/ cellulitis w/Lt wrist abscess? -[name], hand surgeon performed I&D left wrist abscess 9/27  Pulmonary hypertension/diastolic dysfunction -Transfuse for Hb<8 -9/22 transfuse 1 unit PRBC -9/24 transfuse 1 unit PRBC  Acute renal failure(Cr=0.9 at baseline) -improving with aggressive hydration, but not back to baseline.. -Hold any nephrotoxic medications.  Therapeutic INR -2.85 therapeutic  Hypokalemia -Potassium goal > 4   Hypomagnesemia -Magnesium goal>2  Chronic pain -been pain medications for 12 years - managed by Dr. [name] [facility]?).  -Klonopin 2 mg BID PRN  Code Status: FULL Family Communication: None present at time of exam Disposition Plan: Discharge back to CIR in A.m.    Consultants: Dr. Knute Neu Hand Surgery Dr.Brian Rolena [facility] Vascular Surgery  Dr. [name] (ID)   Procedure/Significant Events: 9/15, 9/17, and 9/19 Right thigh abscess I&D, Excision of infected right superficial femoral artery aneurysm 9/22 bilateral LE Doppler;Right: DVT noted in the FV, Pop v, Gastrocnemius v, PTV. Superficial thrombosis noted in the LSV. Unable to image CFV due to bandage/wound vac. No Baker's cyst.  -Left: No evidence of DVT/SVT/ Baker's cyst. 9/19 echocardiogram;- LVEF=  55% to  60%.-(grade 1 diastolic dysfunction). - Pulmonary arteries: PApeak pressure: 41 mm Hg (S). 9/22 transfuse 1 unit PRBC 9/24  transfuse 1 unit PRBC 9/24 LUE venous Doppler: Negative for DVT/SVT 9/27; I&D Lt Hand Abscess   Culture 9/10 blood right hand positive MSSA 9/11 blood right forearm positive MSSA 9/11 negative MRSA by PCR 9/11 urine negative 9/12 blood right AC NGTD   Antibiotics: Zosyn 9/10 > stopped 9/13 Vancomycin 9/10 > stopped 9/15 Ancef 9/14>>   Famciclovir PRN  9/11>>   DVT prophylaxis: SCD   Devices    LINES / TUBES:      Continuous Infusions:    Objective: VITAL SIGNS: Temp: 98 F (36.7 C) (10/13 1641) Temp Source: Oral (10/13 1641) BP: 121/77 mmHg (10/13 1641) Pulse Rate: 111 (10/13 1020) SPO2; FIO2:   Intake/Output Summary (Last 24 hours) at 05/09/15 1950 Last data filed at 05/09/15 1807  Gross per 24 hour  Intake    470 ml  Output   2200 ml  Net  -1730 ml     Exam: General: A/O  4, No acute respiratory distress, significant pain secondary to multiple surgeries and left wrist abscess I&D Eyes: Negative headache, negative scleral hemorrhage ENT: Negative Runny nose, negative ear pain, negative gingival bleeding, Neck:  Negative scars, masses, torticollis, lymphadenopathy, JVD Lungs: Clear to auscultation bilaterally without wheezes or crackles Cardiovascular: Regular rhythm and rate without murmur gallop or rub normal S1 and S2 Abdomen:negative abdominal pain, nondistended, positive soft bowel sounds, no rebound, no ascites, no appreciable mass, midline incision scabbed over and healing well negative sign of infection . Extremities: left forearm/wrist/hand decreased swelling S/P I&D, negative warmth to the touch,right thigh/inguinal area swollen, mild erythematous, negative warmth to touch, surgical area covered and clean. Right foot first and second and third metatarsal bluish in color, (stable) , cool to touch, loss of sensation in first/second/third metatarsal, remainder of foot and leg 3-4+ edema. painful to the touch. Psychiatric:  Negative depression,  negative anxiety, negative fatigue, negative mania  Neurologic:  Cranial nerves II through XII intact, tongue/uvula midline, all extremities muscle strength 5/5, sensation intact throughout,  negative dysarthria, negative expressive aphasia, negative receptive aphasia.   Data Reviewed: Basic Metabolic Panel:  Recent Labs Lab 05/08/15 0640 05/09/15 0428  NA 133* 137  K 3.9 4.0  CL 97* 99*  CO2 27 29  GLUCOSE 116* 105*  BUN 6 7  CREATININE 0.89 0.91  CALCIUM 8.1* 8.6*   Liver Function Tests:  Recent Labs Lab 05/08/15 0640 05/09/15 0428  AST 30 31  ALT 14* 15*  ALKPHOS 113 98  BILITOT 0.8 0.4  PROT 6.7 7.1  ALBUMIN 2.0* 1.9*   No results for input(s): LIPASE, AMYLASE in the last 168 hours. No results for input(s): AMMONIA in the last 168 hours. CBC:  Recent Labs Lab 05/04/15 0951 05/07/15 0510 05/08/15 0640 05/09/15 0428  WBC 9.3 7.4 10.0 6.6  NEUTROABS  --   --  5.1  --   HGB 8.9* 8.1* 8.8* 8.4*  HCT 29.4* 26.4* 28.3* 28.2*  MCV 88.0 86.6 86.5 86.8  PLT 417* 356 348 353   Cardiac Enzymes:  Recent Labs Lab 05/08/15 0640 05/08/15 1036 05/08/15 1645 05/08/15 2030  TROPONINI <0.03 <0.03 <0.03 <0.03   BNP (last 3 results) No results for input(s): BNP in the last 8760 hours.  ProBNP (last 3 results) No results for input(s): PROBNP in the last 8760 hours.  CBG: No results for input(s): GLUCAP in the last 168 hours.  Recent  Results (from the past 240 hour(s))  Urine culture     Status: None   Collection Time: 05/03/15  6:10 PM  Result Value Ref Range Status   Specimen Description URINE, RANDOM  Final   Special Requests NONE  Final   Culture NO GROWTH 2 DAYS  Final   Report Status 05/05/2015 FINAL  Final  MRSA PCR Screening     Status: None   Collection Time: 05/08/15  9:08 AM  Result Value Ref Range Status   MRSA by PCR NEGATIVE NEGATIVE Final    Comment:        The GeneXpert MRSA Assay (FDA approved for NASAL specimens only), is one component  of a comprehensive MRSA colonization surveillance program. It is not intended to diagnose MRSA infection nor to guide or monitor treatment for MRSA infections.   Culture, blood (routine x 2)     Status: None (Preliminary result)   Collection Time: 05/08/15 10:40 AM  Result Value Ref Range Status   Specimen Description BLOOD LEFT FINGER  Final   Special Requests BOTTLES DRAWN AEROBIC AND ANAEROBIC 10CC  Final   Culture NO GROWTH 1 DAY  Final   Report Status PENDING  Incomplete  Culture, blood (routine x 2)     Status: None (Preliminary result)   Collection Time: 05/08/15 10:47 AM  Result Value Ref Range Status   Specimen Description BLOOD LEFT ANTECUBITAL  Final   Special Requests BOTTLES DRAWN AEROBIC ONLY 10CC  Final   Culture NO GROWTH 1 DAY  Final   Report Status PENDING  Incomplete  Urine culture     Status: None   Collection Time: 05/08/15  7:42 PM  Result Value Ref Range Status   Specimen Description URINE, RANDOM  Final   Special Requests NONE  Final   Culture NO GROWTH 1 DAY  Final   Report Status 05/09/2015 FINAL  Final     Studies:  Recent x-ray studies have been reviewed in detail by the Attending Physician  Scheduled Meds:  Scheduled Meds: . atorvastatin  80 mg Oral q1800  .  ceFAZolin (ANCEF) IV  2 g Intravenous Q8H  . collagenase   Topical Daily  . docusate sodium  100 mg Oral BID  . flecainide  100 mg Oral Q12H  . metoprolol tartrate  75 mg Oral BID  . morphine  90 mg Oral Q12H  . naloxegol oxalate  25 mg Oral QAC breakfast  . oxyCODONE  30 mg Oral 4 times per day  . polyethylene glycol  17 g Oral Daily  . sodium chloride  3 mL Intravenous Q12H  . sodium chloride  3 mL Intravenous Q12H  . sodium phosphate  1 enema Rectal Once  . venlafaxine XR  75 mg Oral Q breakfast  . Warfarin - Pharmacist Dosing Inpatient   Does not apply q1800    Time spent on care of this patient: 40 mins   WOODS, Roselind MessierURTIS J , MD  [facility] Office   [phone] Pager - [phone]  On-Call/Text Page:      [email or web address]      password [email or web address]  If 7PM-7AM, please contact night-coverage [email or web address] Password [email or web address] 05/09/2015, 7:50 PM   LOS: 1 day   Care during the described time interval was provided by me .  I have reviewed this patient's available data, including medical history, events of note, physical examination, and all test results as part of my evaluation. I have personally reviewed and interpreted all radiology studies.   Lyda Jesterurtis  Sherral Hammers, [place] Pager

## 2015-05-09 NOTE — H&P (Signed)
  Physical Medicine and Rehabilitation Admission H&P    Chief complaint: Weakness  HPI:[name] is a 59 y.o. right handed male history of hypertension, chronic knee pain, peptic ulcer disease, MRSA abscesses, peripheral neuropathy, hypogonadism taking testosterone supplementation. Independent with occasional cane and walker prior to admission living with wife, in a home with facilities on the first floor and 4 steps to enter. Presented 04/07/2015 with recent fall 04/03/2015 with increasing left hand and wrist pain. In the ED noted to be hypotensive tachycardia and white blood cell count of 31,300. Placed on broad-spectrum anti-biotic's. MRI of left hand show dorsal distal radius transverse fracture, diffuse edema of the hand likely reactive to distal radius fracture, tear of the dorsal band of the scaphounate ligament. Multiple small deep fluid collections in the hands compatible with hematoma question superimposed infection. CT of right femur due to pain showed a 7.4 cm process involving or adjacent to the proximal right SFA question aneurysm, pseudoaneurysm or hematoma. 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 Chen. Follow-up infectious disease blood cultures positive bacillus/MSSA and remains on broad-spectrum antibiotics as advised. Patient returned to the OR 04/13/2015 for a repeat incision and drainage of thigh abscess negative pressure dressing placement as well as washout of right thigh abscess cavity 04/15/2015 and 04/17/2015 with rectus flap procedure 04/19/2015 per plastic surgery Dr Thimmappa and wound VAC change 04/23/2015. . TTE did not show valvular insufficiency or abscess. Hospital course tachycardia/SVT (requiring adenosine) with cardiology services consulted Dr. Ganji. Echocardiogram with ejection fraction of 60% grade 1 diastolic dysfunction. In regards to  abscess of left dorsal hand underwent incision and drainage 04/23/2015 per Dr. Coley. Vascular study left upper extremity shows no DVT. Patient developed gangrenous thromboembolic injury to the right foot and toes with consult Dr. Duda. No plan for invasive procedures at this time with wound care nurse follow-up for skin care. Patient placed on Coumadin for thromboembolism as per Dr. Duda. Physical and occupational therapy evaluations completed 04/24/2015 with recommendations of physical medicine rehabilitation consult. Patient was admitted for comprehensive rehabilitation program 04/29/2015 with slow progressive gains. On the early morning of 05/08/2015 patient with nonspecific chest discomfort. EKG revealed SVT with heart rate 168. Troponin negative. Blood pressure was a bit soft at 103/70. Cardiology services consulted patient was discharged to acute care services for ongoing follow-up in regards to SVT and placed on flecainide 100 mg twice daily. Repeat EKG normal sinus rhythm without ischemia. Patient did receive follow-up from orthopedic services Dr. Duda for thromboembolic disease to the right foot with dry gangrenous changes. Patient remained on chronic Coumadin therapy. Orders for Santyl dressing changes to the black eschar areas of the right foot. There was possible need for minimal amputation surgery which will be addressed as an outpatient. Remains on Ancef as indicated by infectious disease for MSSA bacteremia through October 31 and stop. Progressive left wrist pain with swelling edema suspect possible gouty flareup. Patient is readmitted to inpatient rehabilitation services to proceed with comprehensive rehabilitation program.  ROS Constitutional: Negative for fever and chills.  HENT: Negative for hearing loss.  Eyes: Negative for blurred vision and double vision.  Respiratory: Negative for cough.  Cardiovascular: Positive for leg swelling. Negative for chest pain and palpitations.   Gastrointestinal: Negative for nausea and vomiting.  Genitourinary: Negative for dysuria and hematuria.  Musculoskeletal: Positive for myalgias, joint pain and falls.   Chronic knee pain  Skin:   Negative for rash.  Neurological: Positive for weakness and headaches. Negative for seizures and loss of consciousness.  All other systems reviewed and are negative   Past Medical History  Diagnosis Date  . Hypertension   . Chronic knee pain   . Hyperlipemia   . PUD (peptic ulcer disease)   . Dysrhythmia   . Peripheral vascular disease (HCC)   . Anxiety   . GERD (gastroesophageal reflux disease)    Past Surgical History  Procedure Laterality Date  . I&d extremity Left 07/12/2013    Procedure: I&D Left Thigh Abscess;  Surgeon: John [name], MD;  Location: [facility] OR;  Service: Orthopedics;  Laterality: Left;  . Replacement total knee bilateral    . Tonsillectomy    . Hernia repair    . Cholecystectomy    . Appendectomy    . Shoulder surgery    . Elbow surgery    . 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 Incision and drainage abscess Right 04/11/2015    Procedure: INCISION AND DRAINAGE OF RIGHT THIGH ABSCESS;  Surgeon: Brian L Chen, MD;  Location: [facility] OR;  Service: Vascular;  Laterality: Right;  . Incision and drainage abscess Right 04/13/2015    Procedure: INCISION AND DRAINAGE THIGH ABSCESS;  Surgeon: Brian L Chen, MD;  Location: [facility] OR;  Service: Vascular;  Laterality: Right;  . I&d extremity Left 04/17/2015    Procedure: IRRIGATION AND DEBRIDEMENT LEFT EXTREMITY;  Surgeon: Brian L Chen, MD;  Location: [facility] OR;  Service: Vascular;  Laterality: Left;  . Muscle flap closure Right 04/19/2015    Procedure: RIGHT RECTUS ABDOMINUS  FLAP TO RIGHT GROIN;  Surgeon: Brinda Thimmappa, MD;  Location: [facility] OR;  Service: Plastics;  Laterality:  Right;  . Femoral revision Right 04/19/2015    Procedure: OVERSEW RIGHT FEMORAL VEIN ;  Surgeon: Christopher S Dickson, MD;  Location: [facility] OR;  Service: Vascular;  Laterality: Right;  . Wound exploration Right 04/23/2015    Procedure: EXAM UNDER ANESTHESIA AND VAC CHANGE RIGHT THIGH;  Surgeon: Brinda Thimmappa, MD;  Location: [facility] OR;  Service: Plastics;  Laterality: Right;   Family History  Problem Relation Age of Onset  . Heart attack Paternal Grandmother 55  . Heart disease Mother 70    arrythmia  . Stroke Paternal Grandfather   . Stroke Maternal Grandfather    Social History:  reports that he has quit smoking. His smoking use included Cigarettes. He does not have any smokeless tobacco history on file. He reports that he does not drink alcohol or use illicit drugs. Allergies:  Allergies  Allergen Reactions  . Imitrex [Sumatriptan] Other (See Comments)    Chest pain, 2004 (tolerates 50 mg prn)  . Verapamil Other (See Comments)    Causes pvc's   Medications Prior to Admission  Medication Sig Dispense Refill  . albuterol (PROAIR HFA) 108 (90 BASE) MCG/ACT inhaler Inhale 1 puff into the lungs every 6 (six) hours as needed for wheezing or shortness of breath.     . atorvastatin (LIPITOR) 80 MG tablet Take 80 mg by mouth daily.    . ceFAZolin (ANCEF) 2-3 GM-% SOLR Inject 50 mLs (2 g total) into the vein every 8 (eight) hours.    . clonazePAM (KLONOPIN) 0.5 MG tablet Take 4 tablets (2 mg total) by mouth 2 (two) times daily as needed for anxiety.    .   famciclovir (FAMVIR) 500 MG tablet Take 500 mg by mouth 2 (two) times daily as needed (fever blisters).     . Metoprolol Tartrate 75 MG TABS Take 75 mg by mouth 2 (two) times daily.    . morphine (MS CONTIN) 30 MG 12 hr tablet Take 30 mg by mouth every 12 (twelve) hours. Take with a 60 mg tablet for a 90 mg dose  0  . morphine (MS CONTIN) 60 MG 12 hr tablet Take 60 mg by mouth every 12 (twelve) hours. Take with a 30 mg tablet for a 90 mg dose  0    . naloxegol oxalate (MOVANTIK) 25 MG TABS tablet Take 25 mg by mouth 2 (two) times daily.    . ondansetron (ZOFRAN) 8 MG tablet Take 8 mg by mouth daily as needed for nausea or vomiting.   0  . OVER THE COUNTER MEDICATION Place 1 drop into both eyes 2 (two) times daily as needed (dry eyes). OTC lubricant eye drops    . oxycodone (ROXICODONE) 30 MG immediate release tablet Take 30 mg by mouth every 6 (six) hours. scheduled    . SUMAtriptan (IMITREX) 50 MG tablet Take 50 mg by mouth daily as needed for migraine or headache. Maximum 2 doses in 2 days  0  . trifluridine (VIROPTIC) 1 % ophthalmic solution Place 1 drop into both eyes 2 (two) times daily as needed (breakouts (blurry, matted eyelids)).    . venlafaxine XR (EFFEXOR-XR) 37.5 MG 24 hr capsule Take 37.5 mg by mouth daily with breakfast.    . warfarin (COUMADIN) 5 MG tablet Take 1 tablet (5 mg total) by mouth daily at 6 PM.    . colchicine 0.6 MG tablet Take 1 tablet (0.6 mg total) by mouth 2 (two) times daily. (Patient not taking: Reported on 04/07/2015) 14 tablet 0    Home: Home Living Family/patient expects to be discharged to:: Other (Comment) (rehab) Living Arrangements: Spouse/significant other, Children Available Help at Discharge: Family, Available 24 hours/day Type of Home: House Home Access: Stairs to enter Entrance Stairs-Number of Steps: 4 Entrance Stairs-Rails: Right, Left Home Layout: Two level, 1/2 bath on main level, Bed/bath upstairs Alternate Level Stairs-Number of Steps: Flight Alternate Level Stairs-Rails: Can reach both Bathroom Shower/Tub: Walk-in shower Bathroom Toilet: Handicapped height Bathroom Accessibility: Yes Home Equipment: Walker - 2 wheels, Cane - single point, Bedside commode  Lives With: Spouse, Other (Comment) (Spouse, 2 sons and grandson.)   Functional History: Prior Function Level of Independence: Independent Comments: Used to love playing golf and basketball; likes to watch sports on TV.  Is  now on disability, former pharmaceutical salesman  Functional Status:  Mobility:       Wheelchair Mobility Distance: 25'  ADL:    Cognition: Cognition Overall Cognitive Status: Within Functional Limits for tasks assessed Arousal/Alertness: Awake/alert Orientation Level: Oriented X4 Attention: Focused, Sustained Focused Attention: Appears intact Sustained Attention: Impaired Sustained Attention Impairment: Functional complex Memory: Impaired Awareness: Appears intact Problem Solving: Impaired Problem Solving Impairment: Functional complex Safety/Judgment: Appears intact Cognition Overall Cognitive Status: Within Functional Limits for tasks assessed  Physical Exam: Blood pressure 114/76, pulse 87, temperature 97.9 F (36.6 C), temperature source Oral, resp. rate 12, height 6' 3" (1.905 m), weight 107 kg (235 lb 14.3 oz), SpO2 98 %. Physical Exam Constitutional: He is oriented to person, place, and time. He appears well-developed and well-nourished.  HENT:  Head: Normocephalic and atraumatic.  Eyes: Conjunctivae and EOM are normal.  Neck: Normal range of motion. Neck   supple. No thyromegaly present.  Cardiovascular: RRR, no murmurs, rubs or gallops  Respiratory: Effort normal and breath sounds normal. No respiratory distress.  GI: Soft. Bowel sounds are normal. He exhibits no distension.  Genitourinary:  +Foley  Musculoskeletal: He exhibits edema (LUE). Left wrist warm, red, tender to touch. Slight abrasion. Has edema into hand and forearm as well. RUE 5/5 LUE 3+/5 shoulder abduction, elbow flex/ext; 2 to 2+/5 finger grip. Wrist extension very limited by pain. LLE 3+/5 HF, 4- ke, adf,apf  RLE 2/5 hip flexion, 2+ KE,  2+/5 ankle dorsi/plantar flexion  Neurological: He is alert and oriented to person, place, and time.  Skin: Skin is warm and dry.  Right foot dressing in placed was some dried blood on the dressing. Dry gangrenous changes to the toes worse for the  great toe  Left upper extremity wrist dressing in place appropriately tender    Results for orders placed or performed during the hospital encounter of 05/08/15 (from the past 48 hour(s))  Troponin I (q 6hr x 3)     Status: None   Collection Time: 05/08/15  4:45 PM  Result Value Ref Range   Troponin I <0.03 <0.031 ng/mL    Comment:        NO INDICATION OF MYOCARDIAL INJURY.   Urine culture     Status: None   Collection Time: 05/08/15  7:42 PM  Result Value Ref Range   Specimen Description URINE, RANDOM    Special Requests NONE    Culture NO GROWTH 1 DAY    Report Status 05/09/2015 FINAL   Urinalysis, Routine w reflex microscopic (not at [facility])     Status: Abnormal   Collection Time: 05/08/15  7:42 PM  Result Value Ref Range   Color, Urine YELLOW YELLOW   APPearance CLOUDY (A) CLEAR   Specific Gravity, Urine 1.011 1.005 - 1.030   pH 8.0 5.0 - 8.0   Glucose, UA NEGATIVE NEGATIVE mg/dL   Hgb urine dipstick LARGE (A) NEGATIVE   Bilirubin Urine NEGATIVE NEGATIVE   Ketones, ur NEGATIVE NEGATIVE mg/dL   Protein, ur 30 (A) NEGATIVE mg/dL   Urobilinogen, UA 1.0 0.0 - 1.0 mg/dL   Nitrite NEGATIVE NEGATIVE   Leukocytes, UA NEGATIVE NEGATIVE  Urine microscopic-add on     Status: None   Collection Time: 05/08/15  7:42 PM  Result Value Ref Range   Squamous Epithelial / LPF RARE RARE   WBC, UA 0-2 <3 WBC/hpf   RBC / HPF 11-20 <3 RBC/hpf   Bacteria, UA RARE RARE  Troponin I (q 6hr x 3)     Status: None   Collection Time: 05/08/15  8:30 PM  Result Value Ref Range   Troponin I <0.03 <0.031 ng/mL    Comment:        NO INDICATION OF MYOCARDIAL INJURY.   Comprehensive metabolic panel     Status: Abnormal   Collection Time: 05/09/15  4:28 AM  Result Value Ref Range   Sodium 137 135 - 145 mmol/L   Potassium 4.0 3.5 - 5.1 mmol/L   Chloride 99 (L) 101 - 111 mmol/L   CO2 29 22 - 32 mmol/L   Glucose, Bld 105 (H) 65 - 99 mg/dL   BUN 7 6 - 20 mg/dL   Creatinine, Ser 0.91 0.61 - 1.24  mg/dL   Calcium 8.6 (L) 8.9 - 10.3 mg/dL   Total Protein 7.1 6.5 - 8.1 g/dL   Albumin 1.9 (L) 3.5 - 5.0 g/dL   AST   31 15 - 41 U/L   ALT 15 (L) 17 - 63 U/L   Alkaline Phosphatase 98 38 - 126 U/L   Total Bilirubin 0.4 0.3 - 1.2 mg/dL   GFR calc non Af Amer >60 >60 mL/min   GFR calc Af Amer >60 >60 mL/min    Comment: (NOTE) The eGFR has been calculated using the CKD EPI equation. This calculation has not been validated in all clinical situations. eGFR's persistently <60 mL/min signify possible Chronic Kidney Disease.    Anion gap 9 5 - 15  CBC     Status: Abnormal   Collection Time: 05/09/15  4:28 AM  Result Value Ref Range   WBC 6.6 4.0 - 10.5 K/uL   RBC 3.25 (L) 4.22 - 5.81 MIL/uL   Hemoglobin 8.4 (L) 13.0 - 17.0 g/dL   HCT 28.2 (L) 39.0 - 52.0 %   MCV 86.8 78.0 - 100.0 fL   MCH 25.8 (L) 26.0 - 34.0 pg   MCHC 29.8 (L) 30.0 - 36.0 g/dL   RDW 16.0 (H) 11.5 - 15.5 %   Platelets 353 150 - 400 K/uL  Protime-INR     Status: Abnormal   Collection Time: 05/09/15 12:00 PM  Result Value Ref Range   Prothrombin Time 29.4 (H) 11.6 - 15.2 seconds   INR 2.85 (H) 0.00 - 1.49  Protime-INR     Status: Abnormal   Collection Time: 05/10/15  3:15 AM  Result Value Ref Range   Prothrombin Time 25.9 (H) 11.6 - 15.2 seconds   INR 2.40 (H) 0.00 - 1.49   No results found.     Medical Problem List and Plan: 1. Functional deficits secondary to debilitation/sepsis/multi-medical/MRSA abscesses with multiple irrigation and debridements status post left dorsal hand abscess I&D 04/23/2015 as well as rectus flap procedure 04/19/2015 2.  DVT Prophylaxis/Anticoagulation: Chronic Coumadin therapy for thromboembolism. Monitor for any bleeding episodes 3. Pain Management/chronic knee pain: MS Contin 90 mg every 12 hours, oxycodone immediate release 30 mg 4 times a day,Imitrex 50 mg prn, Percocet 2 tablets every 6 hours as needed. Monitor mental status closely 4. Mood:Effexor 75 mg daily Klonopin 2 mg twice  a day as needed 5. Neuropsych: This patient is capable of making decisions on his own behalf. 6. Skin/Wound Care: Routine skin checks. Wound care nurse follow-up for right foot dressings 7. Fluids/Electrolytes/Nutrition: Routine I&O with follow-up chemistries 8. ID. Continue Ancef 2 g 3 times a day until October 31 and stop 9. Tachycardia SVT. Follow-up cardiology services. Tambocor initiated 05/08/2015 100 mg every 12 hours, continue Lopressor 75 mg twice a day. 10. Hyperlipidemia. Lipitor 11. Constipation. Laxative assistance 12. Persistent left wrist pain with hx of distal radial fracture and scapholunate fracture in September seen on MRI. 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  -re-check x-ray of wrist. Check uric acid level  -consider repeat MRI if gout treatment/work up unremarkable to assess for underlying abscess  Post Admission Physician Evaluation: 1. Functional deficits secondary  to debility due to multiple medical. 2. Patient is admitted to receive collaborative, interdisciplinary care between the physiatrist, rehab nursing staff, and therapy team. 3. Patient's level of medical complexity and substantial therapy needs in context of that medical necessity cannot be provided at a lesser intensity of care such as a SNF. 4. Patient has experienced substantial functional loss from his/her baseline which was documented above   under the "Functional History" and "Functional Status" headings.  Judging by the patient's diagnosis, physical exam, and functional history, the patient has potential for functional progress which will result in measurable gains while on inpatient rehab.  These gains will be of substantial and practical use upon discharge  in facilitating mobility and self-care at the household level. 5. Physiatrist will provide 24 hour management of medical  needs as well as oversight of the therapy plan/treatment and provide guidance as appropriate regarding the interaction of the two. 6. 24 hour rehab nursing will assist with bladder management, bowel management, safety, skin/wound care, disease management, medication administration, pain management and patient education  and help integrate therapy concepts, techniques,education, etc. 7. PT will assess and treat for/with: Lower extremity strength, range of motion, stamina, balance, functional mobility, safety, adaptive techniques and equipment, pain mgt, appropriate orthotics, weight bearing precautions, wound care, ego support. Goals are Mod I. 8. OT will assess and treat for/with: ADL's, functional mobility, safety, upper extremity strength, adaptive techniques and equipment, pain mgt, ortho precautions, wound care, orthotics.   Goals are: mod I. Therapy may not yet proceed with showering this patient. 9. SLP will assess and treat for/with: n/a.  Goals are: n/a. 10. Case Management and Social Worker will assess and treat for psychological issues and discharge planning. 11. Team conference will be held weekly to assess progress toward goals and to determine barriers to discharge. 12. Patient will receive at least 3 hours of therapy per day at least 5 days per week. 13. ELOS: 8-13 days       14. Prognosis:  excellent     Zachary T. Swartz, MD, FAAPMR [employer] [facility] 05/10/2015   05/10/2015 

## 2015-05-09 NOTE — Progress Notes (Signed)
  Plastic Surgery Follow up  POD# 20 rectus flap to right groin/thigh, over sewing femoral vein  Temp:  [97.4 F (36.3 C)-99.4 F (37.4 C)] 99.4 F (37.4 C) (10/13 1215) Pulse Rate:  [82-111] 111 (10/13 1020) Resp:  [10-16] 12 (10/13 0723) BP: (109-164)/(69-127) 124/71 mmHg (10/13 1020) SpO2:  [95 %-98 %] 97 % (10/13 0723)   Events noted, awaiting transfer back to [facility]    PE Abdomen soft, scar with few scabs Right groin scar maturing, no open area RLE proximal thigh : Wound 100 % granulation no slough nearly flush with skin edges, minimal new skin since last exam, scant drainage, thigh less edema, R foot just dressed  A/P S/p rectus muscle flap to prior abscess cavity over stumps of vessels  Continue silver alginate to proximal thigh wound every other day , ok to remove dressing to shower. Ideally would switch to collagen dressings at this time, but not available in hospital.   I will be out of town from 10/14-10/23/16. Dr. Kelly SplinterSanger will be available for any concerns.   Glenna FellowsBrinda Melat Wrisley, MD [facility] Pl[facility] Re[facility] [phone]

## 2015-05-09 NOTE — Progress Notes (Signed)
Rehab admissions - patient known to me from previous inpatient rehab admission.  I have talked with Dr. Joseph ArtWoods today.  If heart rate stable overnight, then can admit back to acute inpatient rehab tomorrow.  Will follow up in am.  Call me for questions.  #[phone]

## 2015-05-09 NOTE — Progress Notes (Signed)
Subjective:  Feeling well, no recurrence of heart racing.  Objective:  Vital Signs in the last 24 hours: Temp:  [97.4 F (36.3 C)-99.9 F (37.7 C)] 97.8 F (36.6 C) (10/13 0723) Pulse Rate:  [82-104] 83 (10/13 0400) Resp:  [9-16] 12 (10/13 0723) BP: (109-164)/(67-127) 124/79 mmHg (10/13 0723) SpO2:  [95 %-98 %] 97 % (10/13 0723)  Intake/Output from previous day: 10/12 0701 - 10/13 0700 In: 1245 [P.O.:1095; IV Piggyback:150] Out: 1700 [Urine:1700]  Physical Exam: General appearance: alert, cooperative, appears stated age, no distress and moderately obese Lungs: clear to auscultation bilaterally Chest wall: no tenderness Heart: regular rate and rhythm, S1, S2 normal, no murmur, click, rub or gallop and Tachycardia present Abdomen: soft, non-tender; bowel sounds normal; no masses, no organomegaly Extremities: Limited examination,surgical site right groin has healed well. 2+ edema right worse than the left trace edema. Pulses: Radial pulses are normal, pedal pulses absent right and feeble left. Normal capillary fill.. Neurologic: Grossly normal   Lab Results: BMP  Recent Labs  05/01/15 0535 05/08/15 0640 05/09/15 0428  NA 134* 133* 137  K 3.8 3.9 4.0  CL 99* 97* 99*  CO2 29 27 29   GLUCOSE 104* 116* 105*  BUN 6 6 7   CREATININE 0.91 0.89 0.91  CALCIUM 7.9* 8.1* 8.6*  GFRNONAA >60 >60 >60  GFRAA >60 >60 >60    CBC  Recent Labs Lab 05/08/15 0640 05/09/15 0428  WBC 10.0 6.6  RBC 3.27* 3.25*  HGB 8.8* 8.4*  HCT 28.3* 28.2*  PLT 348 353  MCV 86.5 86.8  MCH 26.9 25.8*  MCHC 31.1 29.8*  RDW 16.1* 16.0*  LYMPHSABS 3.5  --   MONOABS 1.2*  --   EOSABS 0.1  --   BASOSABS 0.1  --     HEMOGLOBIN A1C Lab Results  Component Value Date   HGBA1C 6.0* 07/15/2013   MPG 126* 07/15/2013    Cardiac Panel (last 3 results)  Recent Labs  04/07/15 0113  05/08/15 1036 05/08/15 1645 05/08/15 2030  CKTOTAL 495*  --   --   --   --   TROPONINI  --   < > <0.03 <0.03  <0.03  < > = values in this interval not displayed.  BNP (last 3 results) No results for input(s): PROBNP in the last 8760 hours.  TSH  Recent Labs  04/14/15 0250  TSH 3.019    CHOLESTEROL No results for input(s): CHOL in the last 8760 hours.  Hepatic Function Panel  Recent Labs  04/07/15 1610 04/08/15 0319  05/01/15 0535 05/08/15 0640 05/09/15 0428  PROT  --  5.2*  < > 6.4* 6.7 7.1  ALBUMIN  --  1.6*  < > 1.6* 2.0* 1.9*  AST  --  24  < > 34 30 31  ALT  --  21  < > 12* 14* 15*  ALKPHOS  --  81  < > 136* 113 98  BILITOT 2.0* 1.8*  < > 0.6 0.8 0.4  BILIDIR 1.1* 0.6*  --   --   --   --   IBILI 0.9 1.2*  --   --   --   --   < > = values in this interval not displayed.  Imaging: Dg Chest Port 1 View  05/08/2015  CLINICAL DATA:  Sepsis. EXAM: PORTABLE CHEST 1 VIEW COMPARISON:  April 26, 2015. FINDINGS: The heart size and mediastinal contours are within normal limits. Both lungs are clear. No pneumothorax or pleural effusion is noted.  Right-sided PICC line is stable in position. The visualized skeletal structures are unremarkable. IMPRESSION: No acute cardiopulmonary abnormality seen. Electronically Signed   By: Lupita Raider, M.D.   On: 05/08/2015 09:09   Dg Abd Portable 1v  05/08/2015  CLINICAL DATA:  Abdominal pain this morning. History of constipation. Pressure. EXAM: PORTABLE ABDOMEN - 1 VIEW COMPARISON:  Chest 04/26/2015 FINDINGS: Stool-filled colon with scattered gas. No small or large bowel distention. No radiopaque stones. Surgical clips in the right abdomen. Visualized bones appear intact. IMPRESSION: Normal nonobstructive bowel gas pattern with diffusely stool-filled colon. Findings may indicate constipation. Electronically Signed   By: Burman Nieves M.D.   On: 05/08/2015 06:34    Cardiac Studies:  EKG 05/09/2015: Sinus rhythm at a rate of 81 bpm,  borderline first-degree AV block, leftward axis, IRBBB, normal QT.   Assessment/Plan:  1. Atrial  tachycardia with variable AV conduction, converted to sinus tachycardia with 6 mg of intravenous and innocent on 04/15/2015. No Recurrence.  2. 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  2. 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  Recommendation: Pt currently stable with no EKG changes on flecainide. Stable for transfer back to rehab.   Erling Conte, NP-C 05/09/2015, 8:12 AM [facility] Pager: [phone] Office: [phone]

## 2015-05-09 NOTE — Progress Notes (Signed)
ANTICOAGULATION CONSULT NOTE - Follow Up Consult  Pharmacy Consult for Coumadin Indication: DVT    Labs:  Recent Labs  05/07/15 0510 05/08/15 0515  05/08/15 0640 05/08/15 1036 05/08/15 1645 05/08/15 2030 05/09/15 0428 05/09/15 1200  HGB 8.1*  --   --  8.8*  --   --   --  8.4*  --   HCT 26.4*  --   --  28.3*  --   --   --  28.2*  --   PLT 356  --   --  348  --   --   --  353  --   APTT  --   --   --   --  57*  --   --   --   --   LABPROT 22.9* 25.7*  --   --  26.4*  --   --   --  29.4*  INR 2.04* 2.38*  --   --  2.47*  --   --   --  2.85*  CREATININE  --   --   --  0.89  --   --   --  0.91  --   TROPONINI  --   --   < > <0.03 <0.03 <0.03 <0.03  --   --   < > = values in this interval not displayed.   Assessment: 59 y/o M transferred from rehab to 2H with SVT (130-175). 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 Functional deficits secondary  to debilitation/sepsis/multi-medical/MRSA abscesses with multiple irrigation and debridements with rectus flap procedure 04/19/2015 also with R femoral neuropathy. INR 2.85 today.   Goal of Therapy:  INR 2-3 Monitor platelets by anticoagulation protocol: Yes   Plan:   - Coumadin 5 mg x 1 tonight. May need alternating 7.5mg  and 5mg  daily long term  - Daily INR while inpatient   Pollyann SamplesAndy Olvin Rohr, PharmD, BCPS 05/09/2015, 1:08 PM Pager: [phone]

## 2015-05-09 NOTE — Consult Note (Addendum)
WOC wound consult note Reason for Consult: Consult requested for necrotic toes to right foot.  Dr Lajoyce Cornersuda assessed locations this AM and has ordered Santyl. Wound type: Full thickness dry eschar to 5 toes on RLE.  Patchy areas peel off easily, revealing pink intact skin.  After skin removal the remaining eschar is tightly adhered, without odor or drainage. Great toe 6X2cm 2nd toe: 2X2cm 3rd toe: 1X1cm 4th toe: .2X.2cm 5th toe no further eschar. Plantar foot with dry skin which removes in sheets, 50% came off easily, 50% remains tightly adhered. 10X10X.1cm affected area. Inner plantar foot with partial thickness wound; .2X.2X.1cm, red and moist. Dressing procedure/placement/frequency: Continue present plan of care with Santyl for enzymatic debridement.  Pt can follow-up with Dr Lajoyce Cornersuda after discharge. Please re-consult if further assistance is needed.  Thank-you,  Cammie Mcgeeawn Dickie Labarre MSN, RN, CWOCN, [place] FarmWCN-AP, CNS [phone]

## 2015-05-10 ENCOUNTER — Inpatient Hospital Stay (HOSPITAL_COMMUNITY)
Admission: RE | Admit: 2015-05-10 | Discharge: 2015-05-25 | DRG: 939 | Disposition: A | Discharge status: Home Health | Payer: Medicaid Other | Source: Intra-hospital | Attending: Physical Medicine & Rehabilitation | Admitting: Physical Medicine & Rehabilitation

## 2015-05-10 ENCOUNTER — Inpatient Hospital Stay (HOSPITAL_COMMUNITY): Payer: Medicaid Other

## 2015-05-10 DIAGNOSIS — M86132 Other acute osteomyelitis, left radius and ulna: Secondary | ICD-10-CM | POA: Diagnosis not present

## 2015-05-10 DIAGNOSIS — E291 Testicular hypofunction: Secondary | ICD-10-CM | POA: Diagnosis not present

## 2015-05-10 DIAGNOSIS — I96 Gangrene, not elsewhere classified: Secondary | ICD-10-CM | POA: Insufficient documentation

## 2015-05-10 DIAGNOSIS — Z79891 Long term (current) use of opiate analgesic: Secondary | ICD-10-CM

## 2015-05-10 DIAGNOSIS — M86171 Other acute osteomyelitis, right ankle and foot: Secondary | ICD-10-CM | POA: Diagnosis not present

## 2015-05-10 DIAGNOSIS — G629 Polyneuropathy, unspecified: Secondary | ICD-10-CM

## 2015-05-10 DIAGNOSIS — M869 Osteomyelitis, unspecified: Secondary | ICD-10-CM

## 2015-05-10 DIAGNOSIS — M25532 Pain in left wrist: Secondary | ICD-10-CM | POA: Diagnosis not present

## 2015-05-10 DIAGNOSIS — Z96653 Presence of artificial knee joint, bilateral: Secondary | ICD-10-CM | POA: Diagnosis present

## 2015-05-10 DIAGNOSIS — Z89419 Acquired absence of unspecified great toe: Secondary | ICD-10-CM | POA: Insufficient documentation

## 2015-05-10 DIAGNOSIS — B9562 Methicillin resistant Staphylococcus aureus infection as the cause of diseases classified elsewhere: Secondary | ICD-10-CM | POA: Diagnosis not present

## 2015-05-10 DIAGNOSIS — G8929 Other chronic pain: Secondary | ICD-10-CM

## 2015-05-10 DIAGNOSIS — B9561 Methicillin susceptible Staphylococcus aureus infection as the cause of diseases classified elsewhere: Secondary | ICD-10-CM | POA: Diagnosis not present

## 2015-05-10 DIAGNOSIS — Z7901 Long term (current) use of anticoagulants: Secondary | ICD-10-CM

## 2015-05-10 DIAGNOSIS — R339 Retention of urine, unspecified: Secondary | ICD-10-CM

## 2015-05-10 DIAGNOSIS — Z79899 Other long term (current) drug therapy: Secondary | ICD-10-CM | POA: Diagnosis not present

## 2015-05-10 DIAGNOSIS — K5900 Constipation, unspecified: Secondary | ICD-10-CM

## 2015-05-10 DIAGNOSIS — Z86718 Personal history of other venous thrombosis and embolism: Secondary | ICD-10-CM

## 2015-05-10 DIAGNOSIS — I10 Essential (primary) hypertension: Secondary | ICD-10-CM

## 2015-05-10 DIAGNOSIS — I729 Aneurysm of unspecified site: Secondary | ICD-10-CM | POA: Insufficient documentation

## 2015-05-10 DIAGNOSIS — Z792 Long term (current) use of antibiotics: Secondary | ICD-10-CM

## 2015-05-10 DIAGNOSIS — I471 Supraventricular tachycardia: Principal | ICD-10-CM

## 2015-05-10 DIAGNOSIS — L02419 Cutaneous abscess of limb, unspecified: Secondary | ICD-10-CM | POA: Insufficient documentation

## 2015-05-10 DIAGNOSIS — M00232 Other streptococcal arthritis, left wrist: Secondary | ICD-10-CM | POA: Insufficient documentation

## 2015-05-10 DIAGNOSIS — M25569 Pain in unspecified knee: Secondary | ICD-10-CM

## 2015-05-10 DIAGNOSIS — A4101 Sepsis due to Methicillin susceptible Staphylococcus aureus: Secondary | ICD-10-CM

## 2015-05-10 DIAGNOSIS — L03114 Cellulitis of left upper limb: Secondary | ICD-10-CM | POA: Diagnosis present

## 2015-05-10 DIAGNOSIS — D62 Acute posthemorrhagic anemia: Secondary | ICD-10-CM | POA: Diagnosis not present

## 2015-05-10 DIAGNOSIS — R609 Edema, unspecified: Secondary | ICD-10-CM | POA: Insufficient documentation

## 2015-05-10 DIAGNOSIS — S52592A Other fractures of lower end of left radius, initial encounter for closed fracture: Secondary | ICD-10-CM | POA: Diagnosis not present

## 2015-05-10 DIAGNOSIS — F4321 Adjustment disorder with depressed mood: Secondary | ICD-10-CM | POA: Diagnosis not present

## 2015-05-10 DIAGNOSIS — E785 Hyperlipidemia, unspecified: Secondary | ICD-10-CM | POA: Diagnosis not present

## 2015-05-10 DIAGNOSIS — A419 Sepsis, unspecified organism: Secondary | ICD-10-CM | POA: Diagnosis present

## 2015-05-10 DIAGNOSIS — G894 Chronic pain syndrome: Secondary | ICD-10-CM | POA: Diagnosis not present

## 2015-05-10 DIAGNOSIS — R5381 Other malaise: Secondary | ICD-10-CM | POA: Diagnosis present

## 2015-05-10 DIAGNOSIS — L02519 Cutaneous abscess of unspecified hand: Secondary | ICD-10-CM | POA: Insufficient documentation

## 2015-05-10 DIAGNOSIS — I76 Septic arterial embolism: Secondary | ICD-10-CM | POA: Insufficient documentation

## 2015-05-10 DIAGNOSIS — E43 Unspecified severe protein-calorie malnutrition: Secondary | ICD-10-CM

## 2015-05-10 DIAGNOSIS — Z87891 Personal history of nicotine dependence: Secondary | ICD-10-CM

## 2015-05-10 DIAGNOSIS — M00032 Staphylococcal arthritis, left wrist: Secondary | ICD-10-CM | POA: Diagnosis not present

## 2015-05-10 DIAGNOSIS — Z6828 Body mass index (BMI) 28.0-28.9, adult: Secondary | ICD-10-CM

## 2015-05-10 DIAGNOSIS — I739 Peripheral vascular disease, unspecified: Secondary | ICD-10-CM | POA: Diagnosis not present

## 2015-05-10 DIAGNOSIS — G546 Phantom limb syndrome with pain: Secondary | ICD-10-CM

## 2015-05-10 DIAGNOSIS — R52 Pain, unspecified: Secondary | ICD-10-CM | POA: Insufficient documentation

## 2015-05-10 DIAGNOSIS — G5721 Lesion of femoral nerve, right lower limb: Secondary | ICD-10-CM | POA: Diagnosis not present

## 2015-05-10 DIAGNOSIS — R29898 Other symptoms and signs involving the musculoskeletal system: Secondary | ICD-10-CM | POA: Insufficient documentation

## 2015-05-10 DIAGNOSIS — B488 Other specified mycoses: Secondary | ICD-10-CM | POA: Diagnosis not present

## 2015-05-10 DIAGNOSIS — A4901 Methicillin susceptible Staphylococcus aureus infection, unspecified site: Secondary | ICD-10-CM | POA: Diagnosis not present

## 2015-05-10 DIAGNOSIS — L02512 Cutaneous abscess of left hand: Secondary | ICD-10-CM | POA: Diagnosis not present

## 2015-05-10 DIAGNOSIS — S52502A Unspecified fracture of the lower end of left radius, initial encounter for closed fracture: Secondary | ICD-10-CM | POA: Diagnosis present

## 2015-05-10 LAB — URIC ACID: URIC ACID, SERUM: 2.3 mg/dL — AB (ref 4.4–7.6)

## 2015-05-10 LAB — CBC WITH DIFFERENTIAL/PLATELET
BASOS ABS: 0.1 10*3/uL (ref 0.0–0.1)
BASOS PCT: 1 %
EOS ABS: 0.2 10*3/uL (ref 0.0–0.7)
Eosinophils Relative: 3 %
HCT: 28.6 % — ABNORMAL LOW (ref 39.0–52.0)
HEMOGLOBIN: 9 g/dL — AB (ref 13.0–17.0)
Lymphocytes Relative: 44 %
Lymphs Abs: 3.5 10*3/uL (ref 0.7–4.0)
MCH: 27 pg (ref 26.0–34.0)
MCHC: 31.5 g/dL (ref 30.0–36.0)
MCV: 85.9 fL (ref 78.0–100.0)
MONO ABS: 0.8 10*3/uL (ref 0.1–1.0)
MONOS PCT: 10 %
NEUTROS PCT: 42 %
Neutro Abs: 3.3 10*3/uL (ref 1.7–7.7)
Platelets: 365 10*3/uL (ref 150–400)
RBC: 3.33 MIL/uL — ABNORMAL LOW (ref 4.22–5.81)
RDW: 16.2 % — AB (ref 11.5–15.5)
WBC: 8 10*3/uL (ref 4.0–10.5)

## 2015-05-10 LAB — PROTIME-INR
INR: 2.4 — ABNORMAL HIGH (ref 0.00–1.49)
Prothrombin Time: 25.9 seconds — ABNORMAL HIGH (ref 11.6–15.2)

## 2015-05-10 LAB — SEDIMENTATION RATE: SED RATE: 111 mm/h — AB (ref 0–16)

## 2015-05-10 MED ORDER — POLYETHYLENE GLYCOL 3350 17 G PO PACK
17.0000 g | PACK | Freq: Every day | ORAL | Status: DC
  Administered 2015-05-11 – 2015-05-25 (×14): 17 g via ORAL
  Filled 2015-05-10 (×15): qty 1

## 2015-05-10 MED ORDER — MORPHINE SULFATE ER 60 MG PO TBCR
90.0000 mg | EXTENDED_RELEASE_TABLET | Freq: Two times a day (BID) | ORAL | Status: DC
  Administered 2015-05-10 – 2015-05-25 (×30): 90 mg via ORAL
  Filled 2015-05-10: qty 2
  Filled 2015-05-10 (×3): qty 1
  Filled 2015-05-10 (×5): qty 2
  Filled 2015-05-10: qty 1
  Filled 2015-05-10 (×2): qty 2
  Filled 2015-05-10 (×2): qty 1
  Filled 2015-05-10: qty 2
  Filled 2015-05-10 (×6): qty 1
  Filled 2015-05-10 (×2): qty 2
  Filled 2015-05-10 (×3): qty 1
  Filled 2015-05-10 (×2): qty 2
  Filled 2015-05-10: qty 1
  Filled 2015-05-10: qty 2
  Filled 2015-05-10: qty 1
  Filled 2015-05-10: qty 2
  Filled 2015-05-10: qty 1
  Filled 2015-05-10 (×2): qty 2
  Filled 2015-05-10 (×2): qty 1
  Filled 2015-05-10: qty 2
  Filled 2015-05-10 (×2): qty 1
  Filled 2015-05-10 (×2): qty 2
  Filled 2015-05-10 (×2): qty 1
  Filled 2015-05-10: qty 2
  Filled 2015-05-10 (×2): qty 1
  Filled 2015-05-10: qty 2
  Filled 2015-05-10: qty 1
  Filled 2015-05-10 (×5): qty 2
  Filled 2015-05-10: qty 1
  Filled 2015-05-10: qty 2
  Filled 2015-05-10 (×2): qty 1
  Filled 2015-05-10 (×2): qty 2

## 2015-05-10 MED ORDER — COLLAGENASE 250 UNIT/GM EX OINT
TOPICAL_OINTMENT | Freq: Every day | CUTANEOUS | Status: DC
  Administered 2015-05-11: 1 via TOPICAL
  Administered 2015-05-12 – 2015-05-16 (×5): via TOPICAL
  Filled 2015-05-10 (×2): qty 30

## 2015-05-10 MED ORDER — METOPROLOL TARTRATE 50 MG PO TABS
75.0000 mg | ORAL_TABLET | Freq: Two times a day (BID) | ORAL | Status: DC
  Administered 2015-05-10 – 2015-05-25 (×26): 75 mg via ORAL
  Filled 2015-05-10 (×60): qty 1

## 2015-05-10 MED ORDER — TRIFLURIDINE 1 % OP SOLN
1.0000 [drp] | Freq: Two times a day (BID) | OPHTHALMIC | Status: DC | PRN
  Filled 2015-05-10: qty 7.5

## 2015-05-10 MED ORDER — ATORVASTATIN CALCIUM 80 MG PO TABS
80.0000 mg | ORAL_TABLET | Freq: Every day | ORAL | Status: DC
  Administered 2015-05-10 – 2015-05-24 (×15): 80 mg via ORAL
  Filled 2015-05-10 (×15): qty 1

## 2015-05-10 MED ORDER — NAPROXEN 250 MG PO TABS
500.0000 mg | ORAL_TABLET | Freq: Two times a day (BID) | ORAL | Status: DC
  Administered 2015-05-11 – 2015-05-25 (×29): 500 mg via ORAL
  Filled 2015-05-10 (×31): qty 2

## 2015-05-10 MED ORDER — WARFARIN SODIUM 7.5 MG PO TABS
7.5000 mg | ORAL_TABLET | Freq: Once | ORAL | Status: AC
  Administered 2015-05-10: 7.5 mg via ORAL
  Filled 2015-05-10: qty 1

## 2015-05-10 MED ORDER — SORBITOL 70 % SOLN: 30.0000 mL | Freq: Every day | Status: DC | PRN

## 2015-05-10 MED ORDER — ALBUTEROL SULFATE (2.5 MG/3ML) 0.083% IN NEBU: 3.0000 mL | INHALATION_SOLUTION | Freq: Four times a day (QID) | RESPIRATORY_TRACT | Status: DC | PRN

## 2015-05-10 MED ORDER — OXYCODONE-ACETAMINOPHEN 5-325 MG PO TABS
2.0000 | ORAL_TABLET | Freq: Four times a day (QID) | ORAL | Status: DC | PRN
  Administered 2015-05-11 – 2015-05-25 (×27): 2 via ORAL
  Filled 2015-05-10 (×28): qty 2

## 2015-05-10 MED ORDER — ACETAMINOPHEN 650 MG RE SUPP: 650.0000 mg | Freq: Four times a day (QID) | RECTAL | Status: DC | PRN

## 2015-05-10 MED ORDER — PREDNISONE 20 MG PO TABS: 20.0000 mg | ORAL_TABLET | Freq: Three times a day (TID) | ORAL | Status: DC

## 2015-05-10 MED ORDER — NALOXEGOL OXALATE 25 MG PO TABS
25.0000 mg | ORAL_TABLET | Freq: Every day | ORAL | Status: DC
  Administered 2015-05-11 – 2015-05-25 (×15): 25 mg via ORAL
  Filled 2015-05-10 (×17): qty 1

## 2015-05-10 MED ORDER — DOCUSATE SODIUM 100 MG PO CAPS
100.0000 mg | ORAL_CAPSULE | Freq: Two times a day (BID) | ORAL | Status: DC
  Administered 2015-05-10 – 2015-05-25 (×29): 100 mg via ORAL
  Filled 2015-05-10 (×29): qty 1

## 2015-05-10 MED ORDER — FLECAINIDE ACETATE 100 MG PO TABS
100.0000 mg | ORAL_TABLET | Freq: Two times a day (BID) | ORAL | Status: DC
  Administered 2015-05-10 – 2015-05-24 (×29): 100 mg via ORAL
  Filled 2015-05-10 (×33): qty 1

## 2015-05-10 MED ORDER — CEFAZOLIN SODIUM-DEXTROSE 2-3 GM-% IV SOLR
2.0000 g | Freq: Three times a day (TID) | INTRAVENOUS | Status: DC
  Administered 2015-05-10 – 2015-05-15 (×15): 2 g via INTRAVENOUS
  Filled 2015-05-10 (×17): qty 50

## 2015-05-10 MED ORDER — CLONAZEPAM 0.5 MG PO TABS
2.0000 mg | ORAL_TABLET | Freq: Two times a day (BID) | ORAL | Status: DC | PRN
  Administered 2015-05-11 – 2015-05-25 (×19): 2 mg via ORAL
  Filled 2015-05-10 (×22): qty 4

## 2015-05-10 MED ORDER — SODIUM CHLORIDE 0.9 % IJ SOLN
10.0000 mL | INTRAMUSCULAR | Status: DC | PRN
  Administered 2015-05-10: 20 mL
  Administered 2015-05-11 (×2): 10 mL
  Administered 2015-05-12: 20 mL
  Administered 2015-05-13 – 2015-05-24 (×13): 10 mL
  Administered 2015-05-25: 20 mL
  Filled 2015-05-10 (×18): qty 40

## 2015-05-10 MED ORDER — OXYCODONE HCL 5 MG PO TABS
30.0000 mg | ORAL_TABLET | Freq: Three times a day (TID) | ORAL | Status: DC
  Administered 2015-05-10 – 2015-05-25 (×58): 30 mg via ORAL
  Filled 2015-05-10 (×58): qty 6

## 2015-05-10 MED ORDER — ONDANSETRON 8 MG PO TBDP
8.0000 mg | ORAL_TABLET | Freq: Three times a day (TID) | ORAL | Status: DC | PRN
  Filled 2015-05-10: qty 1

## 2015-05-10 MED ORDER — OXYCODONE HCL 5 MG PO TABS
30.0000 mg | ORAL_TABLET | Freq: Four times a day (QID) | ORAL | Status: DC
  Administered 2015-05-10: 30 mg via ORAL
  Filled 2015-05-10: qty 6

## 2015-05-10 MED ORDER — ACETAMINOPHEN 325 MG PO TABS: 650.0000 mg | ORAL_TABLET | Freq: Four times a day (QID) | ORAL | Status: DC | PRN

## 2015-05-10 MED ORDER — ADULT MULTIVITAMIN W/MINERALS CH
1.0000 | ORAL_TABLET | Freq: Every day | ORAL | Status: DC
  Administered 2015-05-11 – 2015-05-25 (×14): 1 via ORAL
  Filled 2015-05-10 (×13): qty 1

## 2015-05-10 MED ORDER — WARFARIN SODIUM 7.5 MG PO TABS: 7.5000 mg | ORAL_TABLET | Freq: Once | ORAL | Status: DC

## 2015-05-10 MED ORDER — SUMATRIPTAN SUCCINATE 50 MG PO TABS
50.0000 mg | ORAL_TABLET | ORAL | Status: DC | PRN
  Filled 2015-05-10: qty 1

## 2015-05-10 MED ORDER — ENSURE ENLIVE PO LIQD
237.0000 mL | Freq: Three times a day (TID) | ORAL | Status: DC
  Administered 2015-05-10: 237 mL via ORAL

## 2015-05-10 MED ORDER — ADULT MULTIVITAMIN W/MINERALS CH
1.0000 | ORAL_TABLET | Freq: Every day | ORAL | Status: DC
  Administered 2015-05-10: 1 via ORAL
  Filled 2015-05-10: qty 1

## 2015-05-10 MED ORDER — OXYCODONE HCL 5 MG PO TABS: 30.0000 mg | ORAL_TABLET | Freq: Two times a day (BID) | ORAL | Status: DC

## 2015-05-10 MED ORDER — VENLAFAXINE HCL ER 75 MG PO CP24
75.0000 mg | ORAL_CAPSULE | Freq: Every day | ORAL | Status: DC
  Administered 2015-05-11 – 2015-05-25 (×14): 75 mg via ORAL
  Filled 2015-05-10 (×16): qty 1

## 2015-05-10 MED ORDER — FAMCICLOVIR 500 MG PO TABS
500.0000 mg | ORAL_TABLET | Freq: Two times a day (BID) | ORAL | Status: DC | PRN
  Filled 2015-05-10: qty 1

## 2015-05-10 MED ORDER — GLUCERNA SHAKE PO LIQD: 237.0000 mL | Freq: Three times a day (TID) | ORAL | Status: DC

## 2015-05-10 MED ORDER — WARFARIN - PHARMACIST DOSING INPATIENT
Freq: Every day | Status: DC
  Administered 2015-05-11 – 2015-05-13 (×2)

## 2015-05-10 NOTE — Progress Notes (Signed)
Ankit Karis JubaAnil Patel, MD Physician Signed Physical Medicine and Rehabilitation Consult Note 04/29/2015 6:31 AM  Related encounter: ED to Hosp-Admission (Discharged) [facility]    Expand All Collapse All        Physical Medicine and Rehabilitation Consult Reason for Consult: Sepsis/MSSA bacteremia, right superficial femoral artery aneurysm with right foot thromboembolism Referring Physician: [facility]  HPI: [name] is a 59 y.o. right handed male history of hypertension, chronic knee pain, peptic ulcer disease, MRSA abscesses, peripheral neuropathy. Independent with occasional cane and walker prior to admission living with wife, in a home with facilities on the first floor and 4 steps to enter. Presented 04/07/2015 with recent fall 04/03/2015 with increasing left hand and wrist pain. In the ED noted to be hypotensive tachycardia and white blood cell count of 31,300. Placed on broad-spectrum anti-biotic's. MRI of left hand show dorsal distal radius transverse fracture, diffuse edema of the hand likely reactive to distal radius fracture, tear of the dorsal band of the scaphounate ligament. Multiple small deep fluid collections in the hands compatible with hematoma question superimposed infection. CT of right femur due to pain showed a 7.4 cm process involving or adjacent to the proximal right SFA question aneurysm, pseudoaneurysm or hematoma. 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 Imogene Burnhen. Follow-up infectious disease blood cultures positive bacillus/MSSA and remains on anti-bowel therapy is advised. Patient returned to the OR 04/13/2015 for a repeat incision and drainage of thigh abscess negative pressure dressing placement as well as washout of right thigh abscess cavity 04/15/2015 and 04/17/2015 with rectus flap procedure 04/19/2015  per plastic surgery Dr Leta Baptisthimmappa and wound VAC change 04/23/2015. . TTE did not show valvular insufficiency or abscess. Hospital course tachycardia/SVT (requiring adenosine) with cardiology services consulted Dr. Jacinto HalimGanji. Echocardiogram with ejection fraction of 60% grade 1 diastolic dysfunction. In regards to abscess of left dorsal hand underwent incision and drainage 04/23/2015 per Dr. Izora Ribasoley. Vascular study left upper extremity shows no DVT. Patient developed gangrenous thromboembolic injury to the right foot with consult Dr. Lajoyce Cornersuda. No plan for invasive procedures at this time with wound care nurse follow-up for skin care. Patient placed on Coumadin for thromboembolism as per Dr. Lajoyce Cornersuda. Physical and occupational therapy evaluations completed 04/24/2015 with recommendations of physical medicine rehabilitation consult.   Review of Systems  Constitutional: Negative for fever and chills.  HENT: Negative for hearing loss.  Eyes: Negative for blurred vision and double vision.  Respiratory: Negative for cough.  Cardiovascular: Positive for leg swelling. Negative for chest pain and palpitations.  Gastrointestinal: Negative for nausea and vomiting.  Genitourinary: Negative for dysuria and hematuria.  Musculoskeletal: Positive for myalgias, joint pain and falls.   Chronic knee pain  Skin: Negative for rash.  Neurological: Positive for weakness and headaches. Negative for seizures and loss of consciousness.  All other systems reviewed and are negative.  Past Medical History  Diagnosis Date  . Hypertension   . Chronic knee pain   . Hyperlipemia   . PUD (peptic ulcer disease)    Past Surgical History  Procedure Laterality Date  . I&d extremity Left 07/12/2013    Procedure: I&D Left Thigh Abscess; Surgeon: Toni ArthursJohn [name], MD; [facility]; Service: Orthopedics; Laterality: Left;  . Replacement total knee bilateral    . Tonsillectomy    . Hernia repair      . Cholecystectomy    .  Appendectomy    . Shoulder surgery    . Elbow surgery    . Femoral artery exploration Right 04/11/2015    Procedure: Resection of infected right femoral artery aneurysm; Surgeon: Fransisco Hertz, MD; Location: [facility] OR; Service: Vascular; Laterality: Right;  . Incision and drainage abscess Right 04/11/2015    Procedure: INCISION AND DRAINAGE OF RIGHT THIGH ABSCESS; Surgeon: Fransisco Hertz, MD; Location[facility] OR; Service: Vascular; Laterality: Right;  . Incision and drainage abscess Right 04/13/2015    Procedure: INCISION AND DRAINAGE THIGH ABSCESS; Surgeon: Fransisco Hertz, MD; Location: [facility] OR; Service: Vascular; Laterality: Right;  . I&d extremity Left 04/17/2015    Procedure: IRRIGATION AND DEBRIDEMENT LEFT EXTREMITY; Surgeon: Fransisco Hertz, MD; Location: [facility] OR; Service: Vascular; Laterality: Left;  Marland Kitchen Muscle flap closure Right 04/19/2015    Procedure: RIGHT RECTUS ABDOMINUS FLAP TO RIGHT GROIN; Surgeon: Glenna Fellows, MD; Location: [facility] OR; Service: Plastics; Laterality: Right;  . Femoral revision Right 04/19/2015    Procedure: OVERSEW RIGHT FEMORAL VEIN ; Surgeon: Chuck Hint, MD; Location: [facility] OR; Service: Vascular; Laterality: Right;  . Wound exploration Right 04/23/2015    Procedure: EXAM UNDER ANESTHESIA AND VAC CHANGE RIGHT THIGH; Surgeon: Glenna Fellows, MD; Location: [facility] OR; Service: Plastics; Laterality: Right;   Family History  Problem Relation Age of Onset  . Heart attack Paternal Grandmother 39  . Heart disease Mother 3    arrythmia  . Stroke Paternal Grandfather   . Stroke Maternal Grandfather    Social History:  reports that he has quit smoking. His smoking use included Cigarettes. He does not have any smokeless tobacco history on file. He reports that he does not drink alcohol or use illicit drugs. Allergies:  Allergies  Allergen  Reactions  . Imitrex [Sumatriptan] Other (See Comments)    Chest pain, 2004 (tolerates 50 mg prn)  . Verapamil Other (See Comments)    Causes pvc's   Medications Prior to Admission  Medication Sig Dispense Refill  . albuterol (PROAIR HFA) 108 (90 BASE) MCG/ACT inhaler Inhale 1 puff into the lungs every 6 (six) hours as needed for wheezing or shortness of breath.     Marland Kitchen atenolol (TENORMIN) 100 MG tablet Take 100 mg by mouth daily.    Marland Kitchen atorvastatin (LIPITOR) 80 MG tablet Take 80 mg by mouth daily.    . clonazePAM (KLONOPIN) 2 MG tablet Take 2 mg by mouth 4 (four) times daily.    . Diclofenac-Misoprostol 75-0.2 MG TBEC Take 1 tablet by mouth 2 (two) times daily. Arthrotec  0  . famciclovir (FAMVIR) 500 MG tablet Take 500 mg by mouth 2 (two) times daily as needed (fever blisters).     Marland Kitchen ibuprofen (ADVIL,MOTRIN) 800 MG tablet Take 1 tablet (800 mg total) by mouth 3 (three) times daily. (Patient taking differently: Take 800 mg by mouth 2 (two) times daily as needed (pain). ) 21 tablet 0  . morphine (MS CONTIN) 30 MG 12 hr tablet Take 30 mg by mouth every 12 (twelve) hours. Take with a 60 mg tablet for a 90 mg dose  0  . morphine (MS CONTIN) 60 MG 12 hr tablet Take 60 mg by mouth every 12 (twelve) hours. Take with a 30 mg tablet for a 90 mg dose  0  . naloxegol oxalate (MOVANTIK) 25 MG TABS tablet Take 25 mg by mouth 2 (two) times daily.    . ondansetron (ZOFRAN) 8 MG tablet Take 8 mg by mouth daily as needed for nausea or  vomiting.   0  . OVER THE COUNTER MEDICATION Place 1 drop into both eyes 2 (two) times daily as needed (dry eyes). OTC lubricant eye drops    . oxycodone (ROXICODONE) 30 MG immediate release tablet Take 30 mg by mouth every 6 (six) hours. scheduled    . SUMAtriptan (IMITREX) 50 MG tablet Take 50 mg by mouth daily as needed for migraine or headache. Maximum 2 doses in 2 days  0  . testosterone  cypionate (DEPOTESTOSTERONE CYPIONATE) 200 MG/ML injection Inject 400 mg into the muscle once a week. On Wednesday  0  . trifluridine (VIROPTIC) 1 % ophthalmic solution Place 1 drop into both eyes 2 (two) times daily as needed (breakouts (blurry, matted eyelids)).    Marland Kitchen venlafaxine XR (EFFEXOR-XR) 37.5 MG 24 hr capsule Take 37.5 mg by mouth daily with breakfast.    . colchicine 0.6 MG tablet Take 1 tablet (0.6 mg total) by mouth 2 (two) times daily. (Patient not taking: Reported on 04/07/2015) 14 tablet 0  . oxyCODONE-acetaminophen (PERCOCET/ROXICET) 5-325 MG per tablet Take 1 tablet by mouth every 6 (six) hours as needed for severe pain. (Patient not taking: Reported on 04/07/2015) 10 tablet 0    Home: Home Living Family/patient expects to be discharged to:: Private residence Living Arrangements: Spouse/significant other, Children (6 y.o. grandson) Available Help at Discharge: Family, Available 24 hours/day Type of Home: House Home Access: Stairs to enter [employer] of Steps: 4 Entrance Stairs-Rails: Right, Left Home Layout: Two level, 1/2 bath on main level Bathroom Shower/Tub: Walk-in shower (upstairs) Bathroom Toilet: Standard Home Equipment: Environmental consultant - 2 wheels, Cane - single point, Bedside commode  Functional History: Prior Function Level of Independence: Independent Comments: pt is on disability, former Doctor, hospital Status:  Mobility: Bed Mobility Overal bed mobility: Needs Assistance Bed Mobility: Supine to Sit Supine to sit: Min assist, HOB elevated (use of rail) Sit to supine: Mod assist, +2 for physical assistance General bed mobility comments: pt much more aggressive with his assist today. Transfers Overall transfer level: Needs assistance Equipment used: (chairback for standing assist) Transfers: Sit to/from Stand, [employer] Transfers Sit to Stand: Mod assist, +2 physical assistance Squat pivot transfers: Mod  assist General transfer comment: assist to come forward and initial support at R knee      ADL: ADL Overall ADL's : Needs assistance/impaired Eating/Feeding: Set up, Bed level Grooming: Brushing hair, Oral care, Sitting, Supervision/safety Upper Body Bathing: Sitting, Moderate assistance Lower Body Bathing: Total assistance, +2 for physical assistance, Bed level Upper Body Dressing : Sitting, Moderate assistance Upper Body Dressing Details (indicate cue type and reason): instructed to dress L UE first Lower Body Dressing: +2 for physical assistance, Total assistance, Bed level  Cognition: Cognition Overall Cognitive Status: Within Functional Limits for tasks assessed Orientation Level: Oriented X4 Cognition Arousal/Alertness: Awake/alert Behavior During Therapy: WFL for tasks assessed/performed Overall Cognitive Status: Within Functional Limits for tasks assessed  Blood pressure 163/73, pulse 96, temperature 99.5 F (37.5 C), temperature source Oral, resp. rate 18, height  (1.905 m), weight 103.42 kg (228 lb), SpO2 97 %. Physical Exam  Vitals reviewed. Constitutional: He is oriented to person, place, and time. He appears well-developed and well-nourished.  HENT:  Head: Normocephalic and atraumatic.  Eyes: Conjunctivae and EOM are normal.  Neck: Normal range of motion. Neck supple. No thyromegaly present.  Cardiovascular: Normal rate and regular rhythm.  Respiratory: Effort normal and breath sounds normal. No respiratory distress.  GI: Soft. Bowel sounds are normal.  He exhibits no distension.  Genitourinary:  +Foley  Musculoskeletal: He exhibits edema (LUE).  RUE 5/5 LUE 3+/5 grossly LLE 5/5  RLE 2/5 hip flexion, 3/5 ankle dorsi/plantar  flexion  Neurological: He is alert and oriented to person, place, and time.  Skin: Skin is warm and dry.  Right foot dressing in placed was some dried blood on the dressing.  RLE with VAC and drain in place Left upper extremity  wrist dressing in place appropriately tender     Lab Results Last 24 Hours    Results for orders placed or performed during the hospital encounter of 04/06/15 (from the past 24 hour(s))  CBC with Differential/Platelet Status: Abnormal   Collection Time: 04/29/15 6:42 AM  Result Value Ref Range   WBC 9.5 4.0 - 10.5 K/uL   RBC 2.93 (L) 4.22 - 5.81 MIL/uL   Hemoglobin 8.1 (L) 13.0 - 17.0 g/dL   HCT 16.1 (L) 09.6 - 04.5 %   MCV 87.7 78.0 - 100.0 fL   MCH 27.6 26.0 - 34.0 pg   MCHC 31.5 30.0 - 36.0 g/dL   RDW 40.9 (H) 81.1 - 91.4 %   Platelets 382 150 - 400 K/uL   Neutrophils Relative % 66 %   Neutro Abs 6.3 1.7 - 7.7 K/uL   Lymphocytes Relative 17 %   Lymphs Abs 1.6 0.7 - 4.0 K/uL   Monocytes Relative 14 %   Monocytes Absolute 1.4 (H) 0.1 - 1.0 K/uL   Eosinophils Relative 3 %   Eosinophils Absolute 0.3 0.0 - 0.7 K/uL   Basophils Relative 0 %   Basophils Absolute 0.0 0.0 - 0.1 K/uL  Protime-INR Status: Abnormal   Collection Time: 04/29/15 6:42 AM  Result Value Ref Range   Prothrombin Time 22.4 (H) 11.6 - 15.2 seconds   INR 1.98 (H) 0.00 - 1.49  Basic metabolic panel Status: Abnormal   Collection Time: 04/29/15 6:42 AM  Result Value Ref Range   Sodium 136 135 - 145 mmol/L   Potassium 3.6 3.5 - 5.1 mmol/L   Chloride 99 (L) 101 - 111 mmol/L   CO2 30 22 - 32 mmol/L   Glucose, Bld 99 65 - 99 mg/dL   BUN <5 (L) 6 - 20 mg/dL   Creatinine, Ser 7.82 0.61 - 1.24 mg/dL   Calcium 7.9 (L) 8.9 - 10.3 mg/dL   GFR calc non Af Amer >60 >60 mL/min   GFR calc Af Amer >60 >60 mL/min   Anion gap 7 5 - 15      Imaging Results (Last 48 hours)    No results found.    Assessment/Plan: Diagnosis: Sepsis 1. Does the need for close, 24 hr/day medical supervision in concert with the patient's rehab needs make it unreasonable for this  patient to be served in a less intensive setting? Yes 2. Co-Morbidities requiring supervision/potential complications: SVT, chronic pain, VTE, muscle flap 3. Due to bladder management, safety, skin/wound care, disease management, medication administration, pain management and patient education, does the patient require 24 hr/day rehab nursing? Yes 4. Does the patient require coordinated care of a physician, rehab nurse, PT (1-2 hrs/day, 5 days/week) and OT (1-2 hrs/day, 5 days/week) to address physical and functional deficits in the context of the above medical diagnosis(es)? Yes Addressing deficits in the following areas: balance, endurance, locomotion, strength, transferring, bowel/bladder control, bathing, dressing, grooming, toileting and psychosocial support 5. Can the patient actively participate in an intensive therapy program of at least 3 hrs of therapy per day  at least 5 days per week? Potentially 6. The potential for patient to make measurable gains while on inpatient rehab is good 7. Anticipated functional outcomes upon discharge from inpatient rehab are min assist with PT, supervision and min assist with OT, n/a with SLP. 8. Estimated rehab length of stay to reach the above functional goals is: 13-17 days 9. Does the patient have adequate social supports and living environment to accommodate these discharge functional goals? Potentially 10. Anticipated D/C setting: Home 11. Anticipated post D/C treatments: HH therapy and Home excercise program 12. Overall Rehab/Functional Prognosis: good  RECOMMENDATIONS: This patient's condition is appropriate for continued rehabilitative care in the following setting: CIR Patient has agreed to participate in recommended program. Yes Note that insurance prior authorization may be required for reimbursement for recommended care.  Comment: Rehab Admissions Coordinator to follow up, with focus on stabilization of HR, improvement in pain control,  evidence of ability to tolerate 3 hours therapy/day, and confirmation of social support with physical assistance.    04/29/2015       Revision History     Date/Time User Provider Type Action   04/29/2015 11:54 AM [name], MD Physician Sign   04/29/2015 7:32 AM Charlton Amor, PA-C Physician Assistant Pend   View Details Report       Routing History     Date/Time From To Method   04/29/2015 11:54 AM [name], MD [name], MD In [facility]   04/29/2015 11:54 AM [name], MD [name], MD Fax

## 2015-05-10 NOTE — Discharge Summary (Signed)
DISCHARGE SUMMARY  ARCH METHOT  MR#: [id number]  DOB:Dec 30, 1955  Date of Admission: 05/08/2015 Date of Discharge: 05/10/2015  Attending Physician:Fernando Stoiber T  Patient's WUJ:[id number], MIKE, MD  Consults:  Plastic Surgery / ID / Cardiology   Disposition: Return to CIR   Follow-up Appts:     Follow-up Information    Follow up with DUDA,MARCUS V, MD In 2 weeks.   Specialty:  Orthopedic Surgery   Contact information:   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 Pulmonary hypertension/diastolic dysfunction Chronic pain Gout  Initial presentation: 59 y.o.M Hx PUD, HLD, history of MRSA abscesses, peripheral neuropathy, and chronic pain who originally presented to [facility] with severe left hand pain. He was found to be septic with acute kidney injury and left hand cellulitis, and a right inguinal/thigh abscess from injecting testosterone . He was ultimately diagnoses w/ MSSA bacteremia and underwent multiple debridements of the right thigh/inguinal area and left wrist, to include a muscle flap closure of his extensive R femoral region wound w/ exposed vessels.  He was discharged to CIR on 04/30/2015.  On 10/12 while in CIR patient developed SVT, TX with 6 mg of adenosine given at 7:30 AM with the Rapid Response team at bedside. Patient heart rates at that time were in the 160s to 170s and following administration of adenosine decreased down to the 130s.  This had occurred a couple occasions during his prior acute  hospital stay, and had been evaluated by Cardiology.    Hospital Course:  SVT -This was a recurrence of an issue encountered during his previous acute hospital stay -Cardiology saw the patient and initiated flecainide plus beta blocker therapy -To continue Flecainide 100 mg BID and Metoprolol 75 mgBID -Rate well controlled at time of transfer back to CIR  Severe sepsis - MSSA Bacteremia  -Multiple potential sourcesl: Lt hand cellulitis, UTI/pyelonephritis, Rt groin puncture/aneursym, left wrist abscess -S/P multiple debridement of right thigh/groin -S/P left hand I&D -Continue antibiotics per ID; cefazolin 2 g IV every 8 hours through October 31 -Discharge back to CIR  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 (orthopedic surgery) recommended compression stockings and follow-up as an outpatient. Conservative treatment for now. -Most likely will lose first and second metatarsal right foot  -Edema of right lower extremity much improved when compared to prior hospital stay  RLE DVT -Continue Coumadin per pharmacy - INR at goal throughout this hospitalization  Left hand radius fracture/ cellulitis w/Lt wrist abscess? -[name], hand surgeon performed I&D left wrist abscess 9/27 - patient complained of swelling and increased pain in wrist 10/14 with exam noting erythema and calor - questionable gout flare versus recurrent infection - white blood cell normal and no fever - it was felt that continued monitoring  in the rehabilitation unit was reasonable with no change in treatment and ongoing IV antibiotics - should his symptoms not respond to treatment for gout MRI of the hand is  suggested  Pulmonary hypertension/diastolic dysfunction -Well compensated during this hospital stay  Chronic pain -Pain has been reasonably well controlled throughout this acute hospital stay  Severe decondition status -The patient is interested in returning to inpatient rehabilitation in the medical team feels that he is stable to do so at this time    Medication List    ASK your doctor about these medications        atorvastatin 80 MG tablet  Commonly known as:  LIPITOR  Take 80 mg by mouth daily.     ceFAZolin 2-3 GM-% Solr  Commonly known as:  ANCEF  Inject 50 mLs (2 g total) into the vein every 8 (eight) hours.     clonazePAM 0.5 MG tablet  Commonly known as:  KLONOPIN  Take 4 tablets (2 mg total) by mouth 2 (two) times daily as needed for anxiety.     colchicine 0.6 MG tablet  Take 1 tablet (0.6 mg total) by mouth 2 (two) times daily.     famciclovir 500 MG tablet  Commonly known as:  FAMVIR  Take 500 mg by mouth 2 (two) times daily as needed (fever blisters).     Metoprolol Tartrate 75 MG Tabs  Take 75 mg by mouth 2 (two) times daily.     morphine 30 MG 12 hr tablet  Commonly known as:  MS CONTIN  Take 30 mg by mouth every 12 (twelve) hours. Take with a 60 mg tablet for a 90 mg dose     morphine 60 MG 12 hr tablet  Commonly known as:  MS CONTIN  Take 60 mg by mouth every 12 (twelve) hours. Take with a 30 mg tablet for a 90 mg dose     MOVANTIK 25 MG Tabs tablet  Generic drug:  naloxegol oxalate  Take 25 mg by mouth 2 (two) times daily.     ondansetron 8 MG tablet  Commonly known as:  ZOFRAN  Take 8 mg by mouth daily as needed for nausea or vomiting.     OVER THE COUNTER MEDICATION  Place 1 drop into both eyes 2 (two) times daily as needed (dry eyes). OTC lubricant eye drops     oxycodone 30 MG immediate release tablet  Commonly known as:  ROXICODONE  Take 30 mg by mouth every 6 (six) hours. scheduled     PROAIR HFA 108 (90 BASE) MCG/ACT inhaler    Generic drug:  albuterol  Inhale 1 puff into the lungs every 6 (six) hours as needed for wheezing or shortness of breath.     SUMAtriptan 50 MG tablet  Commonly known as:  IMITREX  Take 50 mg by mouth daily as needed for migraine or headache. Maximum 2 doses in 2 days     trifluridine 1 % ophthalmic solution  Commonly known as:  VIROPTIC  Place 1 drop into both eyes 2 (two) times daily as needed (breakouts (blurry, matted eyelids)).     venlafaxine XR 37.5 MG 24 hr capsule  Commonly known as:  EFFEXOR-XR  Take 37.5 mg by mouth daily with breakfast.     warfarin 5 MG tablet  Commonly known as:  COUMADIN  Take 1 tablet (5 mg total) by mouth daily at 6 PM.       Day of Discharge BP 114/76 mmHg  Pulse  87  Temp(Src) 97.9 F (36.6 C) (Oral)  Resp 12  Ht 6\' 3"  (1.905 m)  Wt 107 kg (235 lb 14.3 oz)  BMI 29.48 kg/m2  SpO2 98%  Physical Exam: General: No acute respiratory distress Lungs: Clear to auscultation bilaterally without wheezes or crackles Cardiovascular: Regular rate and rhythm without murmur gallop or rub normal S1 and S2 Abdomen: Nontender, nondistended, soft, bowel sounds positive, no rebound, no ascites, no appreciable mass Extremities: No significant cyanosis, clubbing, or edema bilateral lower extremities - left wrist is edematous with erythema and calor but without purulent discharge, induration, or fluctuance  Basic Metabolic Panel:  Recent Labs Lab 05/08/15 0640 05/09/15 0428  NA 133* 137  K 3.9 4.0  CL 97* 99*  CO2 27 29  GLUCOSE 116* 105*  BUN 6 7  CREATININE 0.89 0.91  CALCIUM 8.1* 8.6*    Liver Function Tests:  Recent Labs Lab 05/08/15 0640 05/09/15 0428  AST 30 31  ALT 14* 15*  ALKPHOS 113 98  BILITOT 0.8 0.4  PROT 6.7 7.1  ALBUMIN 2.0* 1.9*   Coags:  Recent Labs Lab 05/07/15 0510 05/08/15 0515 05/08/15 1036 05/09/15 1200 05/10/15 0315  INR 2.04* 2.38* 2.47* 2.85* 2.40*   CBC:  Recent Labs Lab 05/04/15 0951  05/07/15 0510 05/08/15 0640 05/09/15 0428  WBC 9.3 7.4 10.0 6.6  NEUTROABS  --   --  5.1  --   HGB 8.9* 8.1* 8.8* 8.4*  HCT 29.4* 26.4* 28.3* 28.2*  MCV 88.0 86.6 86.5 86.8  PLT 417* 356 348 353    Cardiac Enzymes:  Recent Labs Lab 05/08/15 0640 05/08/15 1036 05/08/15 1645 05/08/15 2030  TROPONINI <0.03 <0.03 <0.03 <0.03    Recent Results (from the past 240 hour(s))  Urine culture     Status: None   Collection Time: 05/03/15  6:10 PM  Result Value Ref Range Status   Specimen Description URINE, RANDOM  Final   Special Requests NONE  Final   Culture NO GROWTH 2 DAYS  Final   Report Status 05/05/2015 FINAL  Final  MRSA PCR Screening     Status: None   Collection Time: 05/08/15  9:08 AM  Result Value Ref Range Status   MRSA by PCR NEGATIVE NEGATIVE Final    Comment:        The GeneXpert MRSA Assay (FDA approved for NASAL specimens only), is one component of a comprehensive MRSA colonization surveillance program. It is not intended to diagnose MRSA infection nor to guide or monitor treatment for MRSA infections.   Culture, blood (routine x 2)     Status: None (Preliminary result)   Collection Time: 05/08/15 10:40 AM  Result Value Ref Range Status   Specimen Description BLOOD LEFT FINGER  Final   Special Requests BOTTLES DRAWN AEROBIC AND ANAEROBIC 10CC  Final   Culture NO GROWTH 1 DAY  Final   Report Status PENDING  Incomplete  Culture, blood (routine x 2)     Status: None (Preliminary result)   Collection Time: 05/08/15 10:47 AM  Result Value Ref Range Status   Specimen Description BLOOD LEFT ANTECUBITAL  Final   Special Requests BOTTLES DRAWN AEROBIC ONLY 10CC  Final   Culture NO GROWTH 1 DAY  Final   Report Status PENDING  Incomplete  Urine culture     Status: None   Collection Time: 05/08/15  7:42 PM  Result Value Ref Range Status   Specimen Description URINE, RANDOM  Final   Special Requests NONE  Final   Culture NO GROWTH 1 DAY  Final   Report Status  05/09/2015 FINAL  Final     Time spent in discharge (includes decision making & examination of pt): >35 minutes  05/10/2015, 2:57 PM   Lonia Blood, MD [facility] Office  [phone] Pager [phone]  On-Call/Text Page:      [email or web address]      password [email or web address]

## 2015-05-10 NOTE — H&P (View-Only) (Signed)
Physical Medicine and Rehabilitation Admission H&P    Chief complaint: Weakness  [name] is a 59 y.o. right handed male history of hypertension, chronic knee pain, peptic ulcer disease, MRSA abscesses, peripheral neuropathy, hypogonadism taking testosterone supplementation. Independent with occasional cane and walker prior to admission living with wife, in a home with facilities on the first floor and 4 steps to enter. Presented 04/07/2015 with recent fall 04/03/2015 with increasing left hand and wrist pain. In the ED noted to be hypotensive tachycardia and white blood cell count of 31,300. Placed on broad-spectrum anti-biotic's. MRI of left hand show dorsal distal radius transverse fracture, diffuse edema of the hand likely reactive to distal radius fracture, tear of the dorsal band of the scaphounate ligament. Multiple small deep fluid collections in the hands compatible with hematoma question superimposed infection. CT of right femur due to pain showed a 7.4 cm process involving or adjacent to the proximal right SFA question aneurysm, pseudoaneurysm or hematoma. 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 Bridgett Larsson. Follow-up infectious disease blood cultures positive bacillus/MSSA and remains on broad-spectrum antibiotics as advised. Patient returned to the OR 04/13/2015 for a repeat incision and drainage of thigh abscess negative pressure dressing placement as well as washout of right thigh abscess cavity 04/15/2015 and 04/17/2015 with rectus flap procedure 04/19/2015 per plastic surgery Dr Iran Planas and wound VAC change 04/23/2015. . TTE did not show valvular insufficiency or abscess. Hospital course tachycardia/SVT (requiring adenosine) with cardiology services consulted Dr. Einar Gip. Echocardiogram with ejection fraction of 67% grade 1 diastolic dysfunction. In regards to  abscess of left dorsal hand underwent incision and drainage 04/23/2015 per Dr. Lenon Curt. Vascular study left upper extremity shows no DVT. Patient developed gangrenous thromboembolic injury to the right foot and toes with consult Dr. Sharol Given. No plan for invasive procedures at this time with wound care nurse follow-up for skin care. Patient placed on Coumadin for thromboembolism as per Dr. Sharol Given. Physical and occupational therapy evaluations completed 04/24/2015 with recommendations of physical medicine rehabilitation consult. Patient was admitted for comprehensive rehabilitation program 04/29/2015 with slow progressive gains. On the early morning of 05/08/2015 patient with nonspecific chest discomfort. EKG revealed SVT with heart rate 168. Troponin negative. Blood pressure was a bit soft at 103/70. Cardiology services consulted patient was discharged to acute care services for ongoing follow-up in regards to SVT and placed on flecainide 100 mg twice daily. Repeat EKG normal sinus rhythm without ischemia. Patient did receive follow-up from orthopedic services Dr. Sharol Given for thromboembolic disease to the right foot with dry gangrenous changes. Patient remained on chronic Coumadin therapy. Orders for Santyl dressing changes to the black eschar areas of the right foot. There was possible need for minimal amputation surgery which will be addressed as an outpatient. Remains on Ancef as indicated by infectious disease for MSSA bacteremia through October 31 and stop. Progressive left wrist pain with swelling edema suspect possible gouty flareup. Patient is readmitted to inpatient rehabilitation services to proceed with comprehensive rehabilitation program.  ROS Constitutional: Negative for fever and chills.  HENT: Negative for hearing loss.  Eyes: Negative for blurred vision and double vision.  Respiratory: Negative for cough.  Cardiovascular: Positive for leg swelling. Negative for chest pain and palpitations.   Gastrointestinal: Negative for nausea and vomiting.  Genitourinary: Negative for dysuria and hematuria.  Musculoskeletal: Positive for myalgias, joint pain and falls.   Chronic knee pain  Skin:  Negative for rash.  Neurological: Positive for weakness and headaches. Negative for seizures and loss of consciousness.  All other systems reviewed and are negative   Past Medical History  Diagnosis Date  . Hypertension   . Chronic knee pain   . Hyperlipemia   . PUD (peptic ulcer disease)   . Dysrhythmia   . Peripheral vascular disease ([place])   . Anxiety   . GERD (gastroesophageal reflux disease)    Past Surgical History  Procedure Laterality Date  . I&d extremity Left 07/12/2013    Procedure: I&D Left Thigh Abscess;  Surgeon: Wylene Simmer, MD;  Location: [place];  Service: Orthopedics;  Laterality: Left;  . Replacement total knee bilateral    . Tonsillectomy    . Hernia repair    . Cholecystectomy    . Appendectomy    . Shoulder surgery    . Elbow surgery    . Femoral artery exploration Right 04/11/2015    Procedure: Resection of infected right femoral artery aneurysm;  Surgeon: Conrad Nara Visa, MD;  Location: [place];  Service: Vascular;  Laterality: Right;  . Incision and drainage abscess Right 04/11/2015    Procedure: INCISION AND DRAINAGE OF RIGHT THIGH ABSCESS;  Surgeon: Conrad Brunsville, MD;  Location: [place];  Service: Vascular;  Laterality: Right;  . Incision and drainage abscess Right 04/13/2015    Procedure: INCISION AND DRAINAGE THIGH ABSCESS;  Surgeon: Conrad Fithian, MD;  Location: [place];  Service: Vascular;  Laterality: Right;  . I&d extremity Left 04/17/2015    Procedure: IRRIGATION AND DEBRIDEMENT LEFT EXTREMITY;  Surgeon: Conrad Gregory, MD;  Location: [place];  Service: Vascular;  Laterality: Left;  Marland Kitchen Muscle flap closure Right 04/19/2015    Procedure: RIGHT RECTUS ABDOMINUS  FLAP TO RIGHT GROIN;  Surgeon: Irene Limbo, MD;  Location: [place];  Service: Plastics;  Laterality:  Right;  . Femoral revision Right 04/19/2015    Procedure: OVERSEW RIGHT FEMORAL VEIN ;  Surgeon: Angelia Mould, MD;  Location: [place];  Service: Vascular;  Laterality: Right;  . Wound exploration Right 04/23/2015    Procedure: EXAM UNDER ANESTHESIA AND VAC CHANGE RIGHT THIGH;  Surgeon: Irene Limbo, MD;  Location: [place];  Service: Plastics;  Laterality: Right;   Family History  Problem Relation Age of Onset  . Heart attack Paternal Grandmother 27  . Heart disease Mother 69    arrythmia  . Stroke Paternal Grandfather   . Stroke Maternal Grandfather    Social History:  reports that he has quit smoking. His smoking use included Cigarettes. He does not have any smokeless tobacco history on file. He reports that he does not drink alcohol or use illicit drugs. Allergies:  Allergies  Allergen Reactions  . Imitrex [Sumatriptan] Other (See Comments)    Chest pain, 2004 (tolerates 50 mg prn)  . Verapamil Other (See Comments)    Causes pvc's   Medications Prior to Admission  Medication Sig Dispense Refill  . albuterol (PROAIR HFA) 108 (90 BASE) MCG/ACT inhaler Inhale 1 puff into the lungs every 6 (six) hours as needed for wheezing or shortness of breath.     Marland Kitchen atorvastatin (LIPITOR) 80 MG tablet Take 80 mg by mouth daily.    Marland Kitchen ceFAZolin (ANCEF) 2-3 GM-% SOLR Inject 50 mLs (2 g total) into the vein every 8 (eight) hours.    . clonazePAM (KLONOPIN) 0.5 MG tablet Take 4 tablets (2 mg total) by mouth 2 (two) times daily as needed for anxiety.    Marland Kitchen  famciclovir (FAMVIR) 500 MG tablet Take 500 mg by mouth 2 (two) times daily as needed (fever blisters).     . Metoprolol Tartrate 75 MG TABS Take 75 mg by mouth 2 (two) times daily.    Marland Kitchen morphine (MS CONTIN) 30 MG 12 hr tablet Take 30 mg by mouth every 12 (twelve) hours. Take with a 60 mg tablet for a 90 mg dose  0  . morphine (MS CONTIN) 60 MG 12 hr tablet Take 60 mg by mouth every 12 (twelve) hours. Take with a 30 mg tablet for a 90 mg dose  0    . naloxegol oxalate (MOVANTIK) 25 MG TABS tablet Take 25 mg by mouth 2 (two) times daily.    . ondansetron (ZOFRAN) 8 MG tablet Take 8 mg by mouth daily as needed for nausea or vomiting.   0  . OVER THE COUNTER MEDICATION Place 1 drop into both eyes 2 (two) times daily as needed (dry eyes). OTC lubricant eye drops    . oxycodone (ROXICODONE) 30 MG immediate release tablet Take 30 mg by mouth every 6 (six) hours. scheduled    . SUMAtriptan (IMITREX) 50 MG tablet Take 50 mg by mouth daily as needed for migraine or headache. Maximum 2 doses in 2 days  0  . trifluridine (VIROPTIC) 1 % ophthalmic solution Place 1 drop into both eyes 2 (two) times daily as needed (breakouts (blurry, matted eyelids)).    Marland Kitchen venlafaxine XR (EFFEXOR-XR) 37.5 MG 24 hr capsule Take 37.5 mg by mouth daily with breakfast.    . warfarin (COUMADIN) 5 MG tablet Take 1 tablet (5 mg total) by mouth daily at 6 PM.    . colchicine 0.6 MG tablet Take 1 tablet (0.6 mg total) by mouth 2 (two) times daily. (Patient not taking: Reported on 04/07/2015) 14 tablet 0    Home: Home Living Family/patient expects to be discharged to:: Other (Comment) (rehab) Living Arrangements: Spouse/significant other, Children Available Help at Discharge: Family, Available 24 hours/day Type of Home: House Home Access: Stairs to enter [employer] of Steps: 4 Entrance Stairs-Rails: Right, Left Home Layout: Two level, 1/2 bath on main level, Bed/bath upstairs Alternate Level Stairs-Number of Steps: Flight Alternate Level Stairs-Rails: Can reach both Bathroom Shower/Tub: Multimedia programmer: Handicapped height Bathroom Accessibility: Yes Home Equipment: Environmental consultant - 2 wheels, Cane - single point, Bedside commode  Lives With: Spouse, Other (Comment) (Spouse, 2 sons and grandson.)   Functional History: Prior Function Level of Independence: Independent Comments: Used to love playing golf and basketball; likes to watch sports on TV.  Is  now on disability, former Geophysical data processor Status:  Mobility:       Wheelchair Mobility Distance: 25'  ADL:    Cognition: Cognition Overall Cognitive Status: Within Functional Limits for tasks assessed Arousal/Alertness: Awake/alert Orientation Level: Oriented X4 Attention: Focused, Sustained Focused Attention: Appears intact Sustained Attention: Impaired Sustained Attention Impairment: Functional complex Memory: Impaired Awareness: Appears intact Problem Solving: Impaired Problem Solving Impairment: Functional complex Safety/Judgment: Appears intact Cognition Overall Cognitive Status: Within Functional Limits for tasks assessed  Physical Exam: Blood pressure 114/76, pulse 87, temperature 97.9 F (36.6 C), temperature source Oral, resp. rate 12, height _0  (1.905 m), weight 107 kg (235 lb 14.3 oz), SpO2 98 %. Physical Exam Constitutional: He is oriented to person, place, and time. He appears well-developed and well-nourished.  HENT:  Head: Normocephalic and atraumatic.  Eyes: Conjunctivae and EOM are normal.  Neck: Normal range of motion. Neck  supple. No thyromegaly present.  Cardiovascular: RRR, no murmurs, rubs or gallops  Respiratory: Effort normal and breath sounds normal. No respiratory distress.  GI: Soft. Bowel sounds are normal. He exhibits no distension.  Genitourinary:  +Foley  Musculoskeletal: He exhibits edema (LUE). Left wrist warm, red, tender to touch. Slight abrasion. Has edema into hand and forearm as well. RUE 5/5 LUE 3+/5 shoulder abduction, elbow flex/ext; 2 to 2+/5 finger grip. Wrist extension very limited by pain. LLE 3+/5 HF, 4- ke, adf,apf  RLE 2/5 hip flexion, 2+ KE,  2+/5 ankle dorsi/plantar flexion  Neurological: He is alert and oriented to person, place, and time.  Skin: Skin is warm and dry.  Right foot dressing in placed was some dried blood on the dressing. Dry gangrenous changes to the toes worse for the  great toe  Left upper extremity wrist dressing in place appropriately tender    Results for orders placed or performed during the hospital encounter of 05/08/15 (from the past 48 hour(s))  Troponin I (q 6hr x 3)     Status: None   Collection Time: 05/08/15  4:45 PM  Result Value Ref Range   Troponin I <0.03 <0.031 ng/mL    Comment:        NO INDICATION OF MYOCARDIAL INJURY.   Urine culture     Status: None   Collection Time: 05/08/15  7:42 PM  Result Value Ref Range   Specimen Description URINE, RANDOM    Special Requests NONE    Culture NO GROWTH 1 DAY    Report Status 05/09/2015 FINAL   Urinalysis, Routine w reflex microscopic (not at [facility])     Status: Abnormal   Collection Time: 05/08/15  7:42 PM  Result Value Ref Range   Color, Urine YELLOW YELLOW   APPearance CLOUDY (A) CLEAR   Specific Gravity, Urine 1.011 1.005 - 1.030   pH 8.0 5.0 - 8.0   Glucose, UA NEGATIVE NEGATIVE mg/dL   Hgb urine dipstick LARGE (A) NEGATIVE   Bilirubin Urine NEGATIVE NEGATIVE   Ketones, ur NEGATIVE NEGATIVE mg/dL   Protein, ur 30 (A) NEGATIVE mg/dL   Urobilinogen, UA 1.0 0.0 - 1.0 mg/dL   Nitrite NEGATIVE NEGATIVE   Leukocytes, UA NEGATIVE NEGATIVE  Urine microscopic-add on     Status: None   Collection Time: 05/08/15  7:42 PM  Result Value Ref Range   Squamous Epithelial / LPF RARE RARE   WBC, UA 0-2 <3 WBC/hpf   RBC / HPF 11-20 <3 RBC/hpf   Bacteria, UA RARE RARE  Troponin I (q 6hr x 3)     Status: None   Collection Time: 05/08/15  8:30 PM  Result Value Ref Range   Troponin I <0.03 <0.031 ng/mL    Comment:        NO INDICATION OF MYOCARDIAL INJURY.   Comprehensive metabolic panel     Status: Abnormal   Collection Time: 05/09/15  4:28 AM  Result Value Ref Range   Sodium 137 135 - 145 mmol/L   Potassium 4.0 3.5 - 5.1 mmol/L   Chloride 99 (L) 101 - 111 mmol/L   CO2 29 22 - 32 mmol/L   Glucose, Bld 105 (H) 65 - 99 mg/dL   BUN 7 6 - 20 mg/dL   Creatinine, Ser 0.91 0.61 - 1.24  mg/dL   Calcium 8.6 (L) 8.9 - 10.3 mg/dL   Total Protein 7.1 6.5 - 8.1 g/dL   Albumin 1.9 (L) 3.5 - 5.0 g/dL   AST  31 15 - 41 U/L   ALT 15 (L) 17 - 63 U/L   Alkaline Phosphatase 98 38 - 126 U/L   Total Bilirubin 0.4 0.3 - 1.2 mg/dL   GFR calc non Af Amer >60 >60 mL/min   GFR calc Af Amer >60 >60 mL/min    Comment: (NOTE) The eGFR has been calculated using the CKD EPI equation. This calculation has not been validated in all clinical situations. eGFR's persistently <60 mL/min signify possible Chronic Kidney Disease.    Anion gap 9 5 - 15  CBC     Status: Abnormal   Collection Time: 05/09/15  4:28 AM  Result Value Ref Range   WBC 6.6 4.0 - 10.5 K/uL   RBC 3.25 (L) 4.22 - 5.81 MIL/uL   Hemoglobin 8.4 (L) 13.0 - 17.0 g/dL   HCT 28.2 (L) 39.0 - 52.0 %   MCV 86.8 78.0 - 100.0 fL   MCH 25.8 (L) 26.0 - 34.0 pg   MCHC 29.8 (L) 30.0 - 36.0 g/dL   RDW 16.0 (H) 11.5 - 15.5 %   Platelets 353 150 - 400 K/uL  Protime-INR     Status: Abnormal   Collection Time: 05/09/15 12:00 PM  Result Value Ref Range   Prothrombin Time 29.4 (H) 11.6 - 15.2 seconds   INR 2.85 (H) 0.00 - 1.49  Protime-INR     Status: Abnormal   Collection Time: 05/10/15  3:15 AM  Result Value Ref Range   Prothrombin Time 25.9 (H) 11.6 - 15.2 seconds   INR 2.40 (H) 0.00 - 1.49   No results found.     Medical Problem List and Plan: 1. Functional deficits secondary to debilitation/sepsis/multi-medical/MRSA abscesses with multiple irrigation and debridements status post left dorsal hand abscess I&D 04/23/2015 as well as rectus flap procedure 04/19/2015 2.  DVT Prophylaxis/Anticoagulation: Chronic Coumadin therapy for thromboembolism. Monitor for any bleeding episodes 3. Pain Management/chronic knee pain: MS Contin 90 mg every 12 hours, oxycodone immediate release 30 mg 4 times a day,Imitrex 50 mg prn, Percocet 2 tablets every 6 hours as needed. Monitor mental status closely 4. Mood:Effexor 75 mg daily Klonopin 2 mg twice  a day as needed 5. Neuropsych: This patient is capable of making decisions on his own behalf. 6. Skin/Wound Care: Routine skin checks. Wound care nurse follow-up for right foot dressings 7. Fluids/Electrolytes/Nutrition: Routine I&O with follow-up chemistries 8. ID. Continue Ancef 2 g 3 times a day until October 31 and stop 9. Tachycardia SVT. Follow-up cardiology services. Tambocor initiated 05/08/2015 100 mg every 12 hours, continue Lopressor 75 mg twice a day. 10. Hyperlipidemia. Lipitor 11. Constipation. Laxative assistance 12. Persistent left wrist pain with hx of distal radial fracture and scapholunate fracture in September seen on MRI. 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  -re-check x-ray of wrist. Check uric acid level  -consider repeat MRI if gout treatment/work up unremarkable to assess for underlying abscess  Post Admission Physician Evaluation: 1. Functional deficits secondary  to debility due to multiple medical. 2. Patient is admitted to receive collaborative, interdisciplinary care between the physiatrist, rehab nursing staff, and therapy team. 3. Patient's level of medical complexity and substantial therapy needs in context of that medical necessity cannot be provided at a lesser intensity of care such as a SNF. 4. Patient has experienced substantial functional loss from his/her baseline which was documented above  under the "Functional History" and "Functional Status" headings.  Judging by the patient's diagnosis, physical exam, and functional history, the patient has potential for functional progress which will result in measurable gains while on inpatient rehab.  These gains will be of substantial and practical use upon discharge  in facilitating mobility and self-care at the household level. 5. Physiatrist will provide 24 hour management of medical  needs as well as oversight of the therapy plan/treatment and provide guidance as appropriate regarding the interaction of the two. 6. 24 hour rehab nursing will assist with bladder management, bowel management, safety, skin/wound care, disease management, medication administration, pain management and patient education  and help integrate therapy concepts, techniques,education, etc. 7. PT will assess and treat for/with: Lower extremity strength, range of motion, stamina, balance, functional mobility, safety, adaptive techniques and equipment, pain mgt, appropriate orthotics, weight bearing precautions, wound care, ego support. Goals are Mod I. 8. OT will assess and treat for/with: ADL's, functional mobility, safety, upper extremity strength, adaptive techniques and equipment, pain mgt, ortho precautions, wound care, orthotics.   Goals are: mod I. Therapy may not yet proceed with showering this patient. 9. SLP will assess and treat for/with: n/a.  Goals are: n/a. 10. Case Management and Social Worker will assess and treat for psychological issues and discharge planning. 11. Team conference will be held weekly to assess progress toward goals and to determine barriers to discharge. 12. Patient will receive at least 3 hours of therapy per day at least 5 days per week. 13. ELOS: 8-13 days       14. Prognosis:  excellent     Meredith Staggers, MD, [place] [facility] 05/10/2015   05/10/2015

## 2015-05-10 NOTE — Progress Notes (Signed)
Patient had (L) wrist X-ray this evening, results showed inflammatory arthroplasty or possible diffuse septic arthritis. Notified Kirsteins, MD given order for Naproxen 500 mg BID, CBC with diff in am, and discontinued Prednisone. Also patient should wear (L) wrist splint. Made patient aware, and report given to on-coming nurse to implement orders. [place]

## 2015-05-10 NOTE — PMR Pre-admission (Signed)
PMR Admission Coordinator Pre-Admission Assessment  Patient: [name] is an 59 y.o., male MRN: [id number] DOB: 05-14-1956 Height:  (190.5 cm) Weight: 107 kg (235 lb 14.3 oz)              Insurance Information HMO:No   PPO:       PCP:       IPA:       80/20:       OTHER:   PRIMARY:  Medicaid [place] access      Policy#: [id number]      Subscriber: Elam Dutch CM Name:        Phone#:       Fax#:   Pre-Cert#:        Employer: Disabled Benefits:  Phone #: [phone]     Name:  Automated Eff. Date: Eligible 04/29/15     Deduct:        Out of Pocket Max:        Life Max:   CIR:        SNF:   Outpatient:       Co-Pay:   Home Health:        Co-Pay:   DME:       Co-Pay:   Providers:   Note:  Patient says he has applied for disability 02/24/15 but court case is pending.  He does have medicaid.  Medicaid Application Date:        Case Manager:   Disability Application Date:        Case Worker:    Emergency Contact Information Contact Information    Name Relation Home Work Mobile   Gwinn,[name] Spouse [phone]       Current Medical History  Patient Admitting Diagnosis:  Debility, sepsis  History of Present Illness: A 59 y.o. right handed male history of hypertension, chronic knee pain, peptic ulcer disease, MRSA abscesses, peripheral neuropathy, hypogonadism taking testosterone supplementation. Independent with occasional cane and walker prior to admission living with wife, in a home with facilities on the first floor and 4 steps to enter. Presented 04/07/2015 with recent fall 04/03/2015 with increasing left hand and wrist pain. In the ED noted to be hypotensive tachycardia and white blood cell count of 31,300. Placed on broad-spectrum anti-biotic's. MRI of left hand show dorsal distal radius transverse fracture, diffuse edema of the hand likely reactive to distal radius fracture, tear of the dorsal band of the scaphounate ligament. Multiple small deep fluid collections in the hands  compatible with hematoma question superimposed infection. CT of right femur due to pain showed a 7.4 cm process involving or adjacent to the proximal right SFA question aneurysm, pseudoaneurysm or hematoma. 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 Imogene Burn. Follow-up infectious disease blood cultures positive bacillus/MSSA and remains on broad-spectrum antibiotics as advised. Patient returned to the OR 04/13/2015 for a repeat incision and drainage of thigh abscess negative pressure dressing placement as well as washout of right thigh abscess cavity 04/15/2015 and 04/17/2015 with rectus flap procedure 04/19/2015 per plastic surgery Dr Leta Baptist and wound VAC change 04/23/2015. . TTE did not show valvular insufficiency or abscess. Hospital course tachycardia/SVT (requiring adenosine) with cardiology services consulted Dr. Jacinto Halim. Echocardiogram with ejection fraction of 60% grade 1 diastolic dysfunction. In regards to abscess of left dorsal hand underwent incision and drainage 04/23/2015 per [name]. Vascular study left upper extremity shows no DVT. Patient developed gangrenous thromboembolic injury  to the right foot and toes with consult [name]. No plan for invasive procedures at this time with wound care nurse follow-up for skin care. Patient placed on Coumadin for thromboembolism as per [name]. Physical and occupational therapy evaluations completed 04/24/2015 with recommendations of physical medicine rehabilitation consult. Patient was admitted for comprehensive rehabilitation program 04/29/2015 with slow progressive gains. On the early morning of 05/08/2015 patient with nonspecific chest discomfort. EKG revealed SVT with heart rate 168. Troponin negative. Blood pressure was a bit soft at 103/70. Cardiology services consulted patient was discharged to acute care services for ongoing  follow-up in regards to SVT and placed on flecainide 100 mg twice daily. Repeat EKG normal sinus rhythm without ischemia. Patient did receive follow-up from orthopedic services [name] for thromboembolic disease to the right foot with dry gangrenous changes. Patient remained on chronic Coumadin therapy. Orders for Santyl dressing changes to the black eschar areas of the right foot. There was possible need for minimal amputation surgery which will be addressed as an outpatient. Remains on Ancef as indicated by infectious disease for MSSA bacteremia through October 31 and stop. Patient is readmitted to inpatient rehabilitation services to proceed with comprehensive rehabilitation program.  Note:  Patient is complaining of Left wrist pain/discomfort and some follow up xrays have been ordered.  Will need MD follow up on rehab.    Past Medical History  Past Medical History  Diagnosis Date  . Hypertension   . Chronic knee pain   . Hyperlipemia   . PUD (peptic ulcer disease)   . Dysrhythmia   . Peripheral vascular disease (HCC)   . Anxiety   . GERD (gastroesophageal reflux disease)     Family History  family history includes Heart attack (age of onset: 54) in his paternal grandmother; Heart disease (age of onset: 58) in his mother; Stroke in his maternal grandfather and paternal grandfather.  Prior Rehab/Hospitalizations: Recently on CIR 04/30/15 to 05/08/15.  Returned to acute hospital after he developed SVT.  Has the patient had major surgery during 100 days prior to admission? Yes.  Had a L TKR verision in 05/16  Current Medications   Current facility-administered medications:  .  0.9 %  sodium chloride infusion, 250 mL, Intravenous, PRN, Tora Kindred York, PA-C .  acetaminophen (TYLENOL) tablet 650 mg, 650 mg, Oral, Q6H PRN **OR** acetaminophen (TYLENOL) suppository 650 mg, 650 mg, Rectal, Q6H PRN, Tora Kindred York, PA-C .  albuterol (PROVENTIL) (2.5 MG/3ML) 0.083% nebulizer solution 3 mL, 3  mL, Inhalation, Q6H PRN, Tora Kindred York, PA-C .  atorvastatin (LIPITOR) tablet 80 mg, 80 mg, Oral, q1800, Stephani Police, PA-C, 80 mg at 05/09/15 1807 .  ceFAZolin (ANCEF) IVPB 2 g/50 mL premix, 2 g, Intravenous, Q8H, Lonia Blood, MD, 2 g at 05/10/15 1228 .  clonazePAM (KLONOPIN) tablet 2 mg, 2 mg, Oral, BID PRN, Tora Kindred York, PA-C, 2 mg at 05/10/15 1053 .  collagenase (SANTYL) ointment, , Topical, Daily, Drema Dallas, MD .  docusate sodium (COLACE) capsule 100 mg, 100 mg, Oral, BID, Stephani Police, PA-C, 100 mg at 05/10/15 1035 .  famciclovir [facility]) tablet 500 mg, 500 mg, Oral, BID PRN, Tora Kindred York, PA-C .  feeding supplement (GLUCERNA SHAKE) (GLUCERNA SHAKE) liquid 237 mL, 237 mL, Oral, TID BM, Jenifer A Williams, RD .  flecainide (TAMBOCOR) tablet 100 mg, 100 mg, Oral, Q12H, Yates Decamp, MD, 100 mg at 05/10/15 1034 .  metoprolol tartrate (LOPRESSOR) tablet 75 mg, 75 mg, Oral,  BID, Stephani PoliceMarianne L York, PA-C, 75 mg at 05/10/15 1035 .  morphine (MS CONTIN) 12 hr tablet 90 mg, 90 mg, Oral, Q12H, Stephani PoliceMarianne L York, PA-C, 90 mg at 05/10/15 1035 .  multivitamin with minerals tablet 1 tablet, 1 tablet, Oral, Daily, Jenifer A Williams, RD .  naloxegol oxalate (MOVANTIK) tablet 25 mg, 25 mg, Oral, QAC breakfast, Stephani PoliceMarianne L York, PA-C, 25 mg at 05/10/15 0920 .  ondansetron (ZOFRAN-ODT) disintegrating tablet 8 mg, 8 mg, Oral, Q8H PRN, Tora KindredMarianne L York, PA-C .  oxyCODONE (Oxy IR/ROXICODONE) immediate release tablet 30 mg, 30 mg, Oral, 4 times per day, Stephani PoliceMarianne L York, PA-C, 30 mg at 05/10/15 1228 .  oxyCODONE-acetaminophen (PERCOCET/ROXICET) 5-325 MG per tablet 2 tablet, 2 tablet, Oral, Q6H PRN, Stephani PoliceMarianne L York, PA-C, 2 tablet at 05/10/15 0908 .  polyethylene glycol (MIRALAX / GLYCOLAX) packet 17 g, 17 g, Oral, Daily, Stephani PoliceMarianne L York, PA-C, 17 g at 05/10/15 1035 .  sodium chloride 0.9 % injection 3 mL, 3 mL, Intravenous, Q12H, Marianne L York, PA-C, 10 mL at 05/10/15 1040 .  sodium chloride 0.9 %  injection 3 mL, 3 mL, Intravenous, Q12H, Marianne L York, PA-C, 20 mL at 05/10/15 1038 .  sodium chloride 0.9 % injection 3 mL, 3 mL, Intravenous, PRN, Tora KindredMarianne L York, PA-C .  sodium phosphate (FLEET) 7-19 GM/118ML enema 1 enema, 1 enema, Rectal, Once, Stephani PoliceMarianne L York, PA-C, 1 enema at 05/08/15 1623 .  SUMAtriptan (IMITREX) tablet 50 mg, 50 mg, Oral, Q2H PRN, Tora KindredMarianne L York, PA-C .  trifluridine (VIROPTIC) 1 % ophthalmic solution 1 drop, 1 drop, Both Eyes, BID PRN, Stephani PoliceMarianne L York, PA-C .  venlafaxine XR (EFFEXOR-XR) 24 hr capsule 75 mg, 75 mg, Oral, Q breakfast, Stephani PoliceMarianne L York, PA-C, 75 mg at 05/10/15 0919 .  warfarin (COUMADIN) tablet 7.5 mg, 7.5 mg, Oral, ONCE-1800, Lonia BloodJeffrey T McClung, MD .  Warfarin - Pharmacist Dosing Inpatient, , Does not apply, q1800, Norva Pavlovrystal S Robertson, RPH  Patients Current Diet: Diet Heart Room service appropriate?: Yes; Fluid consistency:: Thin  Precautions / Restrictions Restrictions Weight Bearing Restrictions: Yes RLE Weight Bearing: Non weight bearing   Has the patient had 2 or more falls or a fall with injury in the past year?Yes.  Has had 2 falls.  The last fall resulted in injury to left wrist, elbow and shoulder.  Prior Activity Level Household: Housebound most recently only going out about once every 2 weeks.  Home Assistive Devices / Equipment Home Assistive Devices/Equipment: Environmental consultantWalker (specify type), Wheelchair Home Equipment: Walker - 2 wheels, Cane - single point, Bedside commode  Prior Device Use: Indicate devices/aids used by the patient prior to current illness, exacerbation or injury? Walker and [employer]ane.  Uses a RW most of the time.  Prior Functional Level Prior Function Level of Independence: Independent Comments: Used to love playing golf and basketball; likes to watch sports on TV.  Is now on disability, former Chiropractorpharmaceutical salesman  Self Care: Did the patient need help bathing, dressing, using the toilet or eating?   Independent  Indoor Mobility: Did the patient need assistance with walking from room to room (with or without device)? Independent  Stairs: Did the patient need assistance with internal or external stairs (with or without device)? Independent  Functional Cognition: Did the patient need help planning regular tasks such as shopping or remembering to take medications? Independent  Current Functional Level Cognition  Arousal/Alertness: Awake/alert Overall Cognitive Status: Within Functional Limits for tasks assessed Orientation Level: Oriented X4 Attention: Focused,  Sustained Focused Attention: Appears intact Sustained Attention: Impaired Sustained Attention Impairment: Functional complex Memory: Impaired Awareness: Appears intact Problem Solving: Impaired Problem Solving Impairment: Functional complex Safety/Judgment: Appears intact    Extremity Assessment (includes Sensation/Coordination)  Upper Extremity Assessment: RUE deficits/detail, LUE deficits/detail RUE Deficits / Details: WFL  Lower Extremity Assessment: Defer to PT evaluation RLE Sensation: history of peripheral neuropathy    ADLs  Min/Mod assist    Mobility  Min/mod assist    Transfers  Min/Mod assist    Ambulation / Gait / Stairs / Engineering geologist Distance: 25'    Posture / Balance Static Sitting Balance Static Sitting - Level of Assistance: 5: Stand by assistance Dynamic Sitting Balance Dynamic Sitting - Balance Support: Bilateral upper extremity supported, Feet supported Dynamic Sitting - Level of Assistance: 3: Mod assist Dynamic Sitting - Balance Activities: Lateral lean/weight shifting, Forward lean/weight shifting    Special needs/care consideration BiPAP/CPAP No CPM No Continuous Drip IV No Dialysis No       Life Vest No Oxygen No Special Bed No Trach Size No Wound Vac (area) Yes    Skin Right groin and abdominal wounds, had a drain in right thigh, dressing left  hand.             Bowel mgmt: Last BM 05/09/15 Bladder mgmt: Voiding WDL, enlarged scrotum Diabetic mgmt No    Previous Home Environment Living Arrangements: Spouse/significant other, Children  Lives With: Spouse, Other (Comment) (Spouse, 2 sons and grandson.) Available Help at Discharge: Family, Available 24 hours/day Type of Home: House Home Layout: Two level, 1/2 bath on main level, Bed/bath upstairs Alternate Level Stairs-Rails: Can reach both Alternate Level Stairs-Number of Steps: Flight Home Access: Stairs to enter Entrance Stairs-Rails: Right, Left Entrance Stairs-Number of Steps: 4 Bathroom Shower/Tub: Health visitor: Handicapped height Bathroom Accessibility: Yes How Accessible: Accessible via walker Home Care Services: No  Discharge Living Setting Plans for Discharge Living Setting: Patient's home, Lives with (comment) (Lives with wife.) Type of Home at Discharge: House Discharge Home Layout: Two level, 1/2 bath on main level, Bed/bath upstairs (Plans to use dining room as a bedroom.) Alternate Level Stairs-Number of Steps: Flight Discharge Home Access: Stairs to enter [employer] of Steps: 3 at the front and 5 at the garage entry. Does the patient have any problems obtaining your medications?: No  Social/Family/Support Systems Patient Roles: Spouse, Parent, Other (Comment) (Grandparent) Contact Information: Darragh Nay - wife Anticipated Caregiver: Wife with sons Anticipated Caregiver's Contact Information: Luisfernando Brightwell - wife [phone] Ability/Limitations of Caregiver: Wife can assist, but does have COPD.  Feeling well currently, but can have difficult days Caregiver Availability: 24/7 Discharge Plan Discussed with Primary Caregiver: Yes Is Caregiver In Agreement with Plan?: Yes Does Caregiver/Family have Issues with Lodging/Transportation while Pt is in Rehab?: No  Goals/Additional Needs Patient/Family Goal for Rehab: PT/OT supervision  to min assist goals Expected length of stay: 3 weeks Cultural Considerations: None Dietary Needs: Heart diet, thin liquids Equipment Needs: TBD Pt/Family Agrees to Admission and willing to participate: Yes Program Orientation Provided & Reviewed with Pt/Caregiver Including Roles  & Responsibilities: Yes  Decrease burden of Care through IP rehab admission: N/A  Possible need for SNF placement upon discharge: Not planned  Patient Condition: This patient's medical and functional status has changed since the consult dated: 04/29/15 in which the Rehabilitation Physician determined and documented that the patient's condition is appropriate for intensive rehabilitative care in an inpatient rehabilitation facility. See "History of Present  Illness" (above) for medical update. Functional changes are: Patient has not had therapies for the last 2 days on the acute side of hospital.  On rehab two days ago he needed min/mod assist for ADLS and mobility.  Patient's medical and functional status update has been discussed with the Rehabilitation physician and patient remains appropriate for inpatient rehabilitation. Will admit to inpatient rehab today.  Preadmission Screen Completed By:  Trish Mage, 05/10/2015 1:47 PM ______________________________________________________________________   Discussed status with Dr. Riley Kill on 05/10/15 at 1347 and received telephone approval for admission today.  Admission Coordinator:  Trish Mage, time1347/Date10/14/16

## 2015-05-10 NOTE — Progress Notes (Signed)
ANTICOAGULATION CONSULT NOTE - Follow Up Consult  Pharmacy Consult for Coumadin Indication: DVT    Labs:  Recent Labs  05/08/15 0640 05/08/15 1036 05/08/15 1645 05/08/15 2030 05/09/15 0428 05/09/15 1200 05/10/15 0315  HGB 8.8*  --   --   --  8.4*  --   --   HCT 28.3*  --   --   --  28.2*  --   --   PLT 348  --   --   --  353  --   --   APTT  --  57*  --   --   --   --   --   LABPROT  --  26.4*  --   --   --  29.4* 25.9*  INR  --  2.47*  --   --   --  2.85* 2.40*  CREATININE 0.89  --   --   --  0.91  --   --   TROPONINI <0.03 <0.03 <0.03 <0.03  --   --   --      Assessment: 59 y/o M transferred from rehab to 2H with SVT (130-175). 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 Found to have R lower extremity DVT on 04/18/15. INR 2.4 today.   Goal of Therapy:  INR 2-3 Monitor platelets by anticoagulation protocol: Yes   Plan:  - INR with slight trend down over night will give 7.5 mg X 1 tonight; long term may need 7.5/5 mg alternating regimen. - Daily INR while inpatient - Likely back to inpt rehab soon   Pollyann SamplesAndy Bailee Metter, PharmD, BCPS 05/10/2015, 11:40 AM Pager: [phone]

## 2015-05-10 NOTE — Progress Notes (Signed)
Brief Nutrition Follow-Up Note  Reviewed call from RN. Pt prefers Ensure supplement over Glucerna Shake. RD will modify order.   Will continue to follow.   Soha Thorup A. Mayford KnifeWilliams, RD, LDN, CDE Pager: [phone] After hours Pager: [phone]

## 2015-05-10 NOTE — Progress Notes (Signed)
Patient arrived to [address] on an air overlay mattress, with nurse at bed side. Patient given admission packet, all questions answered. Patient inquired about Oxycodone IR 30 mg 4 times a day prescribed by [place], MD. Patient states he's taken MS Contin 90 mg every 12 hrs, and Oxycodone 30 mg for the past 20 years. Notified Dan A., PA given order for Oxycodone IR 30 mg 4 times daily. Will continue to monitor patient. [place]

## 2015-05-10 NOTE — Progress Notes (Signed)
Trish Mage, RN Rehab Admission Coordinator Signed Physical Medicine and Rehabilitation PMR Pre-admission 05/10/2015 1:00 PM  Related encounter: Admission (Discharged) from 05/08/2015 in [facility]    Expand All Collapse All   PMR Admission Coordinator Pre-Admission Assessment  Patient: [name] is an 59 y.o., male MRN: [id number] DOB: 12-Sep-1955 Height:  (190.5 cm) Weight: 107 kg (235 lb 14.3 oz)  Insurance Information HMO:No PPO: PCP: IPA: 80/20: OTHER:  PRIMARY: Medicaid [place] access Policy#: [id number] T Subscriber: Elam Dutch CM Name: Phone#: Fax#:  Pre-Cert#: Employer: Disabled Benefits: Phone #: [phone] Name: Automated Eff. Date: Eligible 04/29/15 Deduct: Out of Pocket Max: Life Max:  CIR: SNF:  Outpatient: Co-Pay:  Home Health: Co-Pay:  DME: Co-Pay:  Providers:  Note: Patient says he has applied for disability 02/24/15 but court case is pending. He does have medicaid.  Medicaid Application Date: Case Manager:  Disability Application Date: Case Worker:   Emergency Contact Information Contact Information    Name Relation Home Work Mobile   Lyter,[name] Spouse [phone]       Current Medical History  Patient Admitting Diagnosis: Debility, sepsis  History of Present Illness: A 59 y.o. right handed male history of hypertension, chronic knee pain, peptic ulcer disease, MRSA abscesses, peripheral neuropathy, hypogonadism taking testosterone supplementation. Independent with occasional cane and walker prior to admission living with wife, in a home with facilities on the first floor and 4 steps to enter. Presented 04/07/2015  with recent fall 04/03/2015 with increasing left hand and wrist pain. In the ED noted to be hypotensive tachycardia and white blood cell count of 31,300. Placed on broad-spectrum anti-biotic's. MRI of left hand show dorsal distal radius transverse fracture, diffuse edema of the hand likely reactive to distal radius fracture, tear of the dorsal band of the scaphounate ligament. Multiple small deep fluid collections in the hands compatible with hematoma question superimposed infection. CT of right femur due to pain showed a 7.4 cm process involving or adjacent to the proximal right SFA question aneurysm, pseudoaneurysm or hematoma. 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 Imogene Burn. Follow-up infectious disease blood cultures positive bacillus/MSSA and remains on broad-spectrum antibiotics as advised. Patient returned to the OR 04/13/2015 for a repeat incision and drainage of thigh abscess negative pressure dressing placement as well as washout of right thigh abscess cavity 04/15/2015 and 04/17/2015 with rectus flap procedure 04/19/2015 per plastic surgery Dr Leta Baptist and wound VAC change 04/23/2015. . TTE did not show valvular insufficiency or abscess. Hospital course tachycardia/SVT (requiring adenosine) with cardiology services consulted Dr. Jacinto Halim. Echocardiogram with ejection fraction of 60% grade 1 diastolic dysfunction. In regards to abscess of left dorsal hand underwent incision and drainage 04/23/2015 per [name]. Vascular study left upper extremity shows no DVT. Patient developed gangrenous thromboembolic injury to the right foot and toes with consult [name]. No plan for invasive procedures at this time with wound care nurse follow-up for skin care. Patient placed on Coumadin for thromboembolism as per [name]. Physical and occupational therapy evaluations completed 04/24/2015 with  recommendations of physical medicine rehabilitation consult. Patient was admitted for comprehensive rehabilitation program 04/29/2015 with slow progressive gains. On the early morning of 05/08/2015 patient with nonspecific chest discomfort. EKG revealed SVT with heart rate 168. Troponin negative. Blood pressure was a bit soft at 103/70. Cardiology services consulted patient was discharged to acute care services for ongoing follow-up  in regards to SVT and placed on flecainide 100 mg twice daily. Repeat EKG normal sinus rhythm without ischemia. Patient did receive follow-up from orthopedic services [name] for thromboembolic disease to the right foot with dry gangrenous changes. Patient remained on chronic Coumadin therapy. Orders for Santyl dressing changes to the black eschar areas of the right foot. There was possible need for minimal amputation surgery which will be addressed as an outpatient. Remains on Ancef as indicated by infectious disease for MSSA bacteremia through October 31 and stop. Patient is readmitted to inpatient rehabilitation services to proceed with comprehensive rehabilitation program.  Note: Patient is complaining of Left wrist pain/discomfort and some follow up xrays have been ordered. Will need MD follow up on rehab.   Past Medical History  Past Medical History  Diagnosis Date  . Hypertension   . Chronic knee pain   . Hyperlipemia   . PUD (peptic ulcer disease)   . Dysrhythmia   . Peripheral vascular disease (HCC)   . Anxiety   . GERD (gastroesophageal reflux disease)     Family History  family history includes Heart attack (age of onset: 72) in his paternal grandmother; Heart disease (age of onset: 66) in his mother; Stroke in his maternal grandfather and paternal grandfather.  Prior Rehab/Hospitalizations: Recently on CIR 04/30/15 to 05/08/15. Returned to acute hospital after he developed SVT.  Has the patient had major surgery during  100 days prior to admission? Yes. Had a L TKR verision in 05/16  Current Medications   Current facility-administered medications:  . 0.9 % sodium chloride infusion, 250 mL, Intravenous, PRN, Tora Kindred York, PA-C . acetaminophen (TYLENOL) tablet 650 mg, 650 mg, Oral, Q6H PRN **OR** acetaminophen (TYLENOL) suppository 650 mg, 650 mg, Rectal, Q6H PRN, Tora Kindred York, PA-C . albuterol (PROVENTIL) (2.5 MG/3ML) 0.083% nebulizer solution 3 mL, 3 mL, Inhalation, Q6H PRN, Tora Kindred York, PA-C . atorvastatin (LIPITOR) tablet 80 mg, 80 mg, Oral, q1800, Stephani Police, PA-C, 80 mg at 05/09/15 1807 . ceFAZolin (ANCEF) IVPB 2 g/50 mL premix, 2 g, Intravenous, Q8H, Lonia Blood, MD, 2 g at 05/10/15 1228 . clonazePAM (KLONOPIN) tablet 2 mg, 2 mg, Oral, BID PRN, Tora Kindred York, PA-C, 2 mg at 05/10/15 1053 . collagenase (SANTYL) ointment, , Topical, Daily, Drema Dallas, MD . docusate sodium (COLACE) capsule 100 mg, 100 mg, Oral, BID, Stephani Police, PA-C, 100 mg at 05/10/15 1035 . famciclovir [facility]) tablet 500 mg, 500 mg, Oral, BID PRN, Tora Kindred York, PA-C . feeding supplement (GLUCERNA SHAKE) (GLUCERNA SHAKE) liquid 237 mL, 237 mL, Oral, TID BM, Jenifer A Williams, RD . flecainide (TAMBOCOR) tablet 100 mg, 100 mg, Oral, Q12H, Yates Decamp, MD, 100 mg at 05/10/15 1034 . metoprolol tartrate (LOPRESSOR) tablet 75 mg, 75 mg, Oral, BID, Stephani Police, PA-C, 75 mg at 05/10/15 1035 . morphine (MS CONTIN) 12 hr tablet 90 mg, 90 mg, Oral, Q12H, Stephani Police, PA-C, 90 mg at 05/10/15 1035 . multivitamin with minerals tablet 1 tablet, 1 tablet, Oral, Daily, Jenifer A Williams, RD . naloxegol oxalate (MOVANTIK) tablet 25 mg, 25 mg, Oral, QAC breakfast, Stephani Police, PA-C, 25 mg at 05/10/15 0920 . ondansetron (ZOFRAN-ODT) disintegrating tablet 8 mg, 8 mg, Oral, Q8H PRN, Tora Kindred York, PA-C . oxyCODONE (Oxy IR/ROXICODONE) immediate release tablet 30 mg, 30 mg, Oral, 4 times per day,  Stephani Police, PA-C, 30 mg at 05/10/15 1228 . oxyCODONE-acetaminophen (PERCOCET/ROXICET) 5-325 MG per tablet 2 tablet, 2 tablet, Oral,  Q6H PRN, Stephani PoliceMarianne L York, PA-C, 2 tablet at 05/10/15 0908 . polyethylene glycol (MIRALAX / GLYCOLAX) packet 17 g, 17 g, Oral, Daily, Stephani PoliceMarianne L York, PA-C, 17 g at 05/10/15 1035 . sodium chloride 0.9 % injection 3 mL, 3 mL, Intravenous, Q12H, Marianne L York, PA-C, 10 mL at 05/10/15 1040 . sodium chloride 0.9 % injection 3 mL, 3 mL, Intravenous, Q12H, Marianne L York, PA-C, 20 mL at 05/10/15 1038 . sodium chloride 0.9 % injection 3 mL, 3 mL, Intravenous, PRN, Tora KindredMarianne L York, PA-C . sodium phosphate (FLEET) 7-19 GM/118ML enema 1 enema, 1 enema, Rectal, Once, Stephani PoliceMarianne L York, PA-C, 1 enema at 05/08/15 1623 . SUMAtriptan (IMITREX) tablet 50 mg, 50 mg, Oral, Q2H PRN, Tora KindredMarianne L York, PA-C . trifluridine (VIROPTIC) 1 % ophthalmic solution 1 drop, 1 drop, Both Eyes, BID PRN, Stephani PoliceMarianne L York, PA-C . venlafaxine XR (EFFEXOR-XR) 24 hr capsule 75 mg, 75 mg, Oral, Q breakfast, Stephani PoliceMarianne L York, PA-C, 75 mg at 05/10/15 0919 . warfarin (COUMADIN) tablet 7.5 mg, 7.5 mg, Oral, ONCE-1800, Lonia BloodJeffrey T McClung, MD . Warfarin - Pharmacist Dosing Inpatient, , Does not apply, q1800, Norva Pavlovrystal S Robertson, RPH  Patients Current Diet: Diet Heart Room service appropriate?: Yes; Fluid consistency:: Thin  Precautions / Restrictions Restrictions Weight Bearing Restrictions: Yes RLE Weight Bearing: Non weight bearing   Has the patient had 2 or more falls or a fall with injury in the past year?Yes. Has had 2 falls. The last fall resulted in injury to left wrist, elbow and shoulder.  Prior Activity Level Household: Housebound most recently only going out about once every 2 weeks.  Home Assistive Devices / Equipment Home Assistive Devices/Equipment: Environmental consultantWalker (specify type), Wheelchair Home Equipment: Walker - 2 wheels, Cane - single point, Bedside commode  Prior Device Use:  Indicate devices/aids used by the patient prior to current illness, exacerbation or injury? Walker and [employer]ane. Uses a RW most of the time.  Prior Functional Level Prior Function Level of Independence: Independent Comments: Used to love playing golf and basketball; likes to watch sports on TV. Is now on disability, former Chiropractorpharmaceutical salesman  Self Care: Did the patient need help bathing, dressing, using the toilet or eating? Independent  Indoor Mobility: Did the patient need assistance with walking from room to room (with or without device)? Independent  Stairs: Did the patient need assistance with internal or external stairs (with or without device)? Independent  Functional Cognition: Did the patient need help planning regular tasks such as shopping or remembering to take medications? Independent  Current Functional Level Cognition  Arousal/Alertness: Awake/alert Overall Cognitive Status: Within Functional Limits for tasks assessed Orientation Level: Oriented X4 Attention: Focused, Sustained Focused Attention: Appears intact Sustained Attention: Impaired Sustained Attention Impairment: Functional complex Memory: Impaired Awareness: Appears intact Problem Solving: Impaired Problem Solving Impairment: Functional complex Safety/Judgment: Appears intact   Extremity Assessment (includes Sensation/Coordination)  Upper Extremity Assessment: RUE deficits/detail, LUE deficits/detail RUE Deficits / Details: WFL  Lower Extremity Assessment: Defer to PT evaluation RLE Sensation: history of peripheral neuropathy    ADLs  Min/Mod assist    Mobility  Min/mod assist    Transfers  Min/Mod assist    Ambulation / Gait / Stairs / Engineering geologistWheelchair Mobility  Wheelchair Mobility Distance: 25'    Posture / Balance Static Sitting Balance Static Sitting - Level of Assistance: 5: Stand by assistance Dynamic Sitting Balance Dynamic Sitting - Balance Support: Bilateral  upper extremity supported, Feet supported Dynamic Sitting - Level of Assistance: 3: Mod assist Dynamic  Sitting - Balance Activities: Lateral lean/weight shifting, Forward lean/weight shifting    Special needs/care consideration BiPAP/CPAP No CPM No Continuous Drip IV No Dialysis No  Life Vest No Oxygen No Special Bed No Trach Size No Wound Vac (area) Yes  Skin Right groin and abdominal wounds, had a drain in right thigh, dressing left hand.  Bowel mgmt: Last BM 05/09/15 Bladder mgmt: Voiding WDL, enlarged scrotum Diabetic mgmt No    Previous Home Environment Living Arrangements: Spouse/significant other, Children Lives With: Spouse, Other (Comment) (Spouse, 2 sons and grandson.) Available Help at Discharge: Family, Available 24 hours/day Type of Home: House Home Layout: Two level, 1/2 bath on main level, Bed/bath upstairs Alternate Level Stairs-Rails: Can reach both Alternate Level Stairs-Number of Steps: Flight Home Access: Stairs to enter Entrance Stairs-Rails: Right, Left Entrance Stairs-Number of Steps: 4 Bathroom Shower/Tub: Health visitor: Handicapped height Bathroom Accessibility: Yes How Accessible: Accessible via walker Home Care Services: No  Discharge Living Setting Plans for Discharge Living Setting: Patient's home, Lives with (comment) (Lives with wife.) Type of Home at Discharge: House Discharge Home Layout: Two level, 1/2 bath on main level, Bed/bath upstairs (Plans to use dining room as a bedroom.) Alternate Level Stairs-Number of Steps: Flight Discharge Home Access: Stairs to enter [employer] of Steps: 3 at the front and 5 at the garage entry. Does the patient have any problems obtaining your medications?: No  Social/Family/Support Systems Patient Roles: Spouse, Parent, Other (Comment) (Grandparent) Contact Information: Dewey Viens - wife Anticipated Caregiver: Wife with sons Anticipated Caregiver's  Contact Information: Elya Diloreto - wife [phone] Ability/Limitations of Caregiver: Wife can assist, but does have COPD. Feeling well currently, but can have difficult days Caregiver Availability: 24/7 Discharge Plan Discussed with Primary Caregiver: Yes Is Caregiver In Agreement with Plan?: Yes Does Caregiver/Family have Issues with Lodging/Transportation while Pt is in Rehab?: No  Goals/Additional Needs Patient/Family Goal for Rehab: PT/OT supervision to min assist goals Expected length of stay: 3 weeks Cultural Considerations: None Dietary Needs: Heart diet, thin liquids Equipment Needs: TBD Pt/Family Agrees to Admission and willing to participate: Yes Program Orientation Provided & Reviewed with Pt/Caregiver Including Roles & Responsibilities: Yes  Decrease burden of Care through IP rehab admission: N/A  Possible need for SNF placement upon discharge: Not planned  Patient Condition: This patient's medical and functional status has changed since the consult dated: 04/29/15 in which the Rehabilitation Physician determined and documented that the patient's condition is appropriate for intensive rehabilitative care in an inpatient rehabilitation facility. See "History of Present Illness" (above) for medical update. Functional changes are: Patient has not had therapies for the last 2 days on the acute side of hospital. On rehab two days ago he needed min/mod assist for ADLS and mobility. Patient's medical and functional status update has been discussed with the Rehabilitation physician and patient remains appropriate for inpatient rehabilitation. Will admit to inpatient rehab today.  Preadmission Screen Completed By: Trish Mage, 05/10/2015 1:47 PM ______________________________________________________________________  Discussed status with Dr. Riley Kill on 05/10/15 at 1347 and received telephone approval for admission today.  Admission Coordinator: Trish Mage,  [id number]/Date10/14/16          Cosigned by: Ranelle Oyster, MD at 05/10/2015 1:58 PM  Revision History     Date/Time User Provider Type Action   05/10/2015 1:58 PM Ranelle Oyster, MD Physician Cosign   05/10/2015 1:48 PM Trish Mage, RN Rehab Admission Coordinator Sign

## 2015-05-10 NOTE — Interval H&P Note (Signed)
[name] was admitted today to Inpatient Rehabilitation with the diagnosis of debility after multiple medical.  The patient's history has been reviewed, patient examined, and there is no change in status.  Patient continues to be appropriate for intensive inpatient rehabilitation.  I have reviewed the patient's chart and labs.  Questions were answered to the patient's satisfaction. The PAPE has been reviewed and assessment remains appropriate.  [name] 05/10/2015, 6:07 PM

## 2015-05-10 NOTE — Progress Notes (Addendum)
Notified of the xray results.  Reviewed films , discussed with ortho hand surgeon Dr Mina MarbleWeingold who is covering [facility].  Pt had Left wrsit abscess I and D per Dr Izora Ribasoley 04/25/15.  Has been on Keflex for MSSA bacteremia presumed to be from Right thigh abscess. Pt afebrile with a normal WBC ct from 10/13.  Urate level normal at 2.6  Also discussed with Hand surgery, Dr Izora Ribasoley differential includes gout vs smoldering infection, will d/c prednisone ( did not received yet), start Naproxen.    Apply wrist splint Both hand surgeons did not feel this is likely to be a primary infected joint given lack of Elevated WBC and fever Recheck CBC in am Dr Izora Ribasoley to assess in am

## 2015-05-10 NOTE — Progress Notes (Signed)
Initial Nutrition Assessment  DOCUMENTATION CODES:   Not applicable  INTERVENTION:   -Glucerna Shake po TID, each supplement provides 220 kcal and 10 grams of protein -MVI daily  NUTRITION DIAGNOSIS:   Increased nutrient needs related to wound healing as evidenced by estimated needs.  GOAL:   Patient will meet greater than or equal to 90% of their needs  MONITOR:   PO intake, Supplement acceptance, Diet advancement, Labs, Weight trends, Skin, I & O's  REASON FOR ASSESSMENT:   Malnutrition Screening Tool    ASSESSMENT:   [name] is a 59 y.o. male with complicated history in which he had previously developed SVT which was resolved with adenosine during his prior admission. Patient was retreated this a.m. with 6 mg of adenosine given at 7:30 AM with the rapid response team at bedside. Patient heart rates at that time were in the 160s to 170s and following administration of adenosine decreased down to the 130s. Has patient meets sepsis criteria Antibiotics changed today from Ancef to Zosyn and will re-panculture. ID is consulted  Pt admitted from CIR for SVT.   Pt being bathed by nurse tech at time of visit. Unable to complete Nutrition-Focused physical exam at this time.   Reviewed RD notes from previous admission (nutrition-focused physical exam revealed no fat and muscle depletion on 04/08/15). Pt was eating well PTA and was receiving Ensure Enlive supplements TID during previous admission. Meal completion 50-100% this admission. Pt with increased calorie and protein needs, due to wound healing; pt currently with no supplements ordered- RD will order.   Orthopedics and ID following for gangrenous foot wound. Reviewed [facility] note from 05/09/15; pt with full thickness dry eschar to 5 toes on RLE and partial thickness wound on inner plantar foot; remains on santyl.   Reviewed rehab admissions coordinator note; plan to discharge back to CIR today if heart rate remains stable  overnight.   Labs reviewed.   Diet Order:  Diet Heart Room service appropriate?: Yes; Fluid consistency:: Thin  Skin:  Wound (see comment) (partial thickness rt foot wound, dry eschar rt toes)  Last BM:  05/09/15  Height:   Ht Readings from Last 1 Encounters:  05/08/15 6\' 3"  (1.905 m)    Weight:   Wt Readings from Last 1 Encounters:  05/10/15 235 lb 14.3 oz (107 kg)    Ideal Body Weight:  89.1 kg  BMI:  Body mass index is 29.48 kg/(m^2).  Estimated Nutritional Needs:   Kcal:  2200-2400  Protein:  115-130 grams  Fluid:  2.2-2.4 L  EDUCATION NEEDS:   No education needs identified at this time  Simran Mannis A. Mayford KnifeWilliams, RD, LDN, CDE Pager: [phone] After hours Pager: [phone]

## 2015-05-11 ENCOUNTER — Inpatient Hospital Stay (HOSPITAL_COMMUNITY): Payer: 59 | Admitting: Physical Therapy

## 2015-05-11 ENCOUNTER — Inpatient Hospital Stay (HOSPITAL_COMMUNITY): Payer: Medicaid Other

## 2015-05-11 ENCOUNTER — Inpatient Hospital Stay (HOSPITAL_COMMUNITY): Payer: 59 | Admitting: Occupational Therapy

## 2015-05-11 LAB — PROTIME-INR
INR: 2.11 — AB (ref 0.00–1.49)
PROTHROMBIN TIME: 23.5 s — AB (ref 11.6–15.2)

## 2015-05-11 MED ORDER — ENSURE ENLIVE PO LIQD
237.0000 mL | Freq: Two times a day (BID) | ORAL | Status: DC
  Administered 2015-05-11 – 2015-05-25 (×15): 237 mL via ORAL

## 2015-05-11 MED ORDER — WARFARIN SODIUM 5 MG PO TABS
5.0000 mg | ORAL_TABLET | Freq: Once | ORAL | Status: AC
  Administered 2015-05-11: 5 mg via ORAL
  Filled 2015-05-11: qty 1

## 2015-05-11 NOTE — Progress Notes (Signed)
ANTICOAGULATION CONSULT NOTE - Follow Up Consult  Pharmacy Consult for Coumadin Indication: DVT    Labs:  Recent Labs  05/08/15 1645 05/08/15 2030 05/09/15 0428 05/09/15 1200 05/10/15 0315 05/10/15 2150 05/11/15 0510  HGB  --   --  8.4*  --   --  9.0*  --   HCT  --   --  28.2*  --   --  28.6*  --   PLT  --   --  353  --   --  365  --   LABPROT  --   --   --  29.4* 25.9*  --  23.5*  INR  --   --   --  2.85* 2.40*  --  2.11*  CREATININE  --   --  0.91  --   --   --   --   TROPONINI <0.03 <0.03  --   --   --   --   --      Assessment: 59 y/o M transferred back to rehab. 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 Found to have R lower extremity DVT on 04/18/15. INR 2.11 today.    Goal of Therapy:  INR 2-3 Monitor platelets by anticoagulation protocol: Yes   Plan:  - Warfarin 5 mg PO x1; long term may need 7.5/5 mg alternating regimen. - Daily INR while inpatient   Greggory Stallionristy Reyes, PharmD Clinical Pharmacy Resident Pager # [phone] 05/11/2015 12:32 PM

## 2015-05-11 NOTE — Evaluation (Signed)
Occupational Therapy Assessment and Plan  Patient Details  Name: [name] MRN: [id number] Date of Birth: 1956/04/29  OT Diagnosis: abnormal posture, acute pain, muscle weakness (generalized), pain in joint and swelling of limb Rehab Potential: Rehab Potential (ACUTE ONLY): Good ELOS: 14-17 days   Today's Date: 05/11/2015 OT Individual Time: 1045-1200 and 1300-1330 OT Individual Time Calculation (min): 75 min   And 30 min  Problem List:  Patient Active Problem List   Diagnosis Date Noted  . Dry gangrene ([place])   . Wound, open, foot with complication   . Acute deep vein thrombosis (DVT) of distal end of right lower extremity ([place])   . Left wrist fracture   . Abscess of bursa, left wrist   . SVT (supraventricular tachycardia) ([facility]) 05/08/2015  . 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 Adjustment disorder with depressed mood   . 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 Debility 05/01/2015  . Sepsis ([place]) 04/30/2015  . Acute renal failure with tubular necrosis ([place])   . Abscess of left hand   . Staphylococcus aureus bacteremia with sepsis ([place])   . Colles' fracture of left radius   . Cellulitis of left upper extremity   . Diastolic dysfunction   . Nondisplaced fracture of distal end of left radius   . Abscess   . Bacteremia due to Staphylococcus   . Pulmonary hypertension ([place])   . Acute blood loss anemia   . 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 DVT, lower extremity ([place])   . Radius distal fracture   . Cellulitis of finger of left hand   . Acute pyelonephritis   . Arm pain, anterior   . Cellulitis   . Bacteremia due to Staphylococcus aureus   . Acute renal failure syndrome ([place])   . Fracture of left radius   . ARF (acute renal failure) ([place]) 04/07/2015  . Severe sepsis ([place]) 04/07/2015  . Cellulitis of left hand 04/07/2015  . Elevated INR 04/07/2015  . Foot swelling 04/07/2015  . Elevated bilirubin 04/07/2015  . Hypokalemia 04/07/2015  .  Hypomagnesemia 04/07/2015  . Femoral artery aneurysm, right ([place]) 04/07/2015  . Bacteremia 04/07/2015  . UTI (lower urinary tract infection)   . Chest pain 11/15/2013  . History of duodenal ulcer 11/15/2013  . 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 Septic shock due to abscess 07/13/2013  . Chronic use of opiate drugs therapeutic purposes 07/12/2013  . Acute renal failure ([place]) 07/11/2013  . Chronic pain syndrome 07/11/2013  . Testosterone deficiency 07/11/2013    Past Medical History:  Past Medical History  Diagnosis Date  . Hypertension   . Chronic knee pain   . Hyperlipemia   . PUD (peptic ulcer disease)   . Dysrhythmia   . Peripheral vascular disease ([place])   . Anxiety   . GERD (gastroesophageal reflux disease)    Past Surgical History:  Past Surgical History  Procedure Laterality Date  . I&d extremity Left 07/12/2013    Procedure: I&D Left Thigh Abscess;  Surgeon: Wylene Simmer, MD;  Location: [facility];  Service: Orthopedics;  Laterality: Left;  . Replacement total knee bilateral    . Tonsillectomy    . Hernia repair    . Cholecystectomy    . Appendectomy    . Shoulder surgery    . Elbow surgery    . Femoral artery exploration Right 04/11/2015    Procedure: Resection of infected right femoral artery aneurysm;  Surgeon: Conrad Millersville, MD;  [facility];  Service: Vascular;  Laterality: Right;  . Incision and drainage abscess Right 04/11/2015    Procedure: INCISION AND DRAINAGE OF RIGHT THIGH ABSCESS;  Surgeon: Conrad Dallastown, MD;  Location: [facility];  Service: Vascular;  Laterality: Right;  . Incision and drainage abscess Right 04/13/2015    Procedure: INCISION AND DRAINAGE THIGH ABSCESS;  Surgeon: Conrad Mila Doce, MD;  Location: [facility];  Service: Vascular;  Laterality: Right;  . I&d extremity Left 04/17/2015    Procedure: IRRIGATION AND DEBRIDEMENT LEFT EXTREMITY;  Surgeon: Conrad Covington, MD;  Location: [facility];  Service: Vascular;  Laterality: Left;  Marland Kitchen Muscle flap closure Right 04/19/2015     Procedure: RIGHT RECTUS ABDOMINUS  FLAP TO RIGHT GROIN;  Surgeon: Irene Limbo, MD;  Location: [facility];  Service: Plastics;  Laterality: Right;  . Femoral revision Right 04/19/2015    Procedure: OVERSEW RIGHT FEMORAL VEIN ;  Surgeon: Angelia Mould, MD;  Location: [facility];  Service: Vascular;  Laterality: Right;  . Wound exploration Right 04/23/2015    Procedure: EXAM UNDER ANESTHESIA AND VAC CHANGE RIGHT THIGH;  Surgeon: Irene Limbo, MD;  Location: [facility];  Service: Plastics;  Laterality: Right;    Assessment & Plan Clinical Impression: [name] is a 59 y.o. right handed male history of hypertension, chronic knee pain, peptic ulcer disease, MRSA abscesses, peripheral neuropathy, hypogonadism taking testosterone supplementation. Independent with occasional cane and walker prior to admission living with wife, in a home with facilities on the first floor and 4 steps to enter. Presented 04/07/2015 with recent fall 04/03/2015 with increasing left hand and wrist pain. In the ED noted to be hypotensive tachycardia and white blood cell count of 31,300. Placed on broad-spectrum anti-biotic's. MRI of left hand show dorsal distal radius transverse fracture, diffuse edema of the hand likely reactive to distal radius fracture, tear of the dorsal band of the scaphounate ligament. Multiple small deep fluid collections in the hands compatible with hematoma question superimposed infection. CT of right femur due to pain showed a 7.4 cm process involving or adjacent to the proximal right SFA question aneurysm, pseudoaneurysm or hematoma. 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 Bridgett Larsson. Follow-up infectious disease blood cultures positive bacillus/MSSA and remains on broad-spectrum antibiotics as advised. Patient returned to the OR 04/13/2015 for a repeat incision and drainage of thigh abscess  negative pressure dressing placement as well as washout of right thigh abscess cavity 04/15/2015 and 04/17/2015 with rectus flap procedure 04/19/2015 per plastic surgery Dr Iran Planas and wound VAC change 04/23/2015. . TTE did not show valvular insufficiency or abscess. Hospital course tachycardia/SVT (requiring adenosine) with cardiology services consulted Dr. Einar Gip. Echocardiogram with ejection fraction of 05% grade 1 diastolic dysfunction. In regards to abscess of left dorsal hand underwent incision and drainage 04/23/2015 per Dr. Lenon Curt. Vascular study left upper extremity shows no DVT. Patient developed gangrenous thromboembolic injury to the right foot and toes with consult Dr. Sharol Given. No plan for invasive procedures at this time with wound care nurse follow-up for skin care. Patient placed on Coumadin for thromboembolism as per Dr. Sharol Given. Physical and occupational therapy evaluations completed 04/24/2015 with recommendations of physical medicine rehabilitation consult. Patient was admitted for comprehensive rehabilitation program 04/29/2015 with slow progressive gains. On the early morning of 05/08/2015 patient with nonspecific chest discomfort. EKG revealed SVT with heart rate 168. Troponin negative. Blood pressure was a bit soft at  103/70. Cardiology services consulted patient was discharged to acute care services for ongoing follow-up in regards to SVT and placed on flecainide 100 mg twice daily. Repeat EKG normal sinus rhythm without ischemia. Patient did receive follow-up from orthopedic services Dr. Sharol Given for thromboembolic disease to the right foot with dry gangrenous changes. Patient remained on chronic Coumadin therapy. Orders for Santyl dressing changes to the black eschar areas of the right foot. There was possible need for minimal amputation surgery which will be addressed as an outpatient. Remains on Ancef as indicated by infectious disease for MSSA bacteremia through October 31 and stop. Progressive  left wrist pain with swelling edema suspect possible gouty flareup. Patient is readmitted to inpatient rehabilitation services to proceed with comprehensive rehabilitation program. Patient transferred to CIR on 05/10/2015 .    Patient currently requires min with basic self-care skills secondary to muscle weakness, decreased cardiorespiratoy endurance and decreased sitting balance, decreased standing balance, decreased postural control, decreased balance strategies and difficulty maintaining precautions.  Prior to hospitalization, patient could complete ADLs/IADLs with modified independent .  Patient will benefit from skilled intervention to decrease level of assist with basic self-care skills, increase independence with basic self-care skills and increase level of independence with iADL prior to discharge home with care partner.  Anticipate patient will require intermittent supervision and follow up home health or outpatient OT.  OT - End of Session Activity Tolerance: Tolerates 30+ min activity with multiple rests Endurance Deficit: Yes Endurance Deficit Description: cardiorespiratory OT Assessment Rehab Potential (ACUTE ONLY): Good OT Patient demonstrates impairments in the following area(s): Balance;Edema;Endurance;Motor;Safety;Sensory;Pain;Skin Integrity OT Basic ADL's Functional Problem(s): Bathing;Dressing;Toileting OT Transfers Functional Problem(s): Toilet OT Additional Impairment(s): Fuctional Use of Upper Extremity OT Plan OT Intensity: Minimum of 1-2 x/day, 45 to 90 minutes OT Frequency: 5 out of 7 days OT Duration/Estimated Length of Stay: 14-17 days OT Treatment/Interventions: Balance/vestibular training;Cognitive remediation/compensation;Discharge planning;Community reintegration;DME/adaptive equipment instruction;Functional mobility training;Pain management;Psychosocial support;Skin care/wound managment;Self Care/advanced ADL retraining;Patient/family education;Therapeutic  Activities;UE/LE Strength taining/ROM;UE/LE Coordination activities;Therapeutic Exercise;Wheelchair propulsion/positioning;Disease mangement/prevention OT Self Feeding Anticipated Outcome(s): Mod I OT Basic Self-Care Anticipated Outcome(s): Supervision OT Toileting Anticipated Outcome(s): Supervision- mod I OT Bathroom Transfers Anticipated Outcome(s): Mod I toilet; Supervision shower OT Recommendation Patient destination: Home Follow Up Recommendations: Outpatient OT Equipment Recommended: To be determined;3 in 1 bedside comode   Skilled Therapeutic Intervention Session One: Pt seen for OT eval and ADL bathing/dressing session. Pt in supine upon arrival, agreeable to tx session. He transferred to EOB with spervision using bedrails and completed stand pivot transfer to w/c using PFRW and min A. Bathing/ dressing completed in w/c at sink. VCs provided for hemi dressing technique for painful R side. Completed sit <> stands at sink with min A and min-mod steadying assist with VCs for weight shift during functional standing task.  He completed [facility] to [facility] via stand pivot with PFRW and min A. He then ambulated~17f to back to bed. Sitting EOB, pt completed fine motor task, required to remove and replace lids from various self care items. Pt with decreased functional grasp in L UE, able to use L UE at diminished/ stabilizer level. Pt with pain in R shoulder with shoulder flexion/ ext. Exercises. Pt noted to have limited scapular motion. Massage/ scapular mobilization performed with pt feeling increased comfort.  Pt requested return to supine at end of session, left with all needs in reach.   Session Two: Pt seen for OT therapy session focusing on fine motor coordination, UE ROM/ strengthening/, and functional activity tolerance.  Pt in supine finishing lunch upon arrival, agreeable to tx session. He transferred to w/c with min A using PFRW and taken to therapy gym total A for time and energy  conservation.  In gym, pt completed 9 hole peg test, see results below. PROM/scapular mobilization performed to pt's L UE with pt voicing increase comfort with shoulder scapularization, however, pt still reports increased discomfort in R shoulder with mobility. Pt completed x10 shoulder shrugs and scapular protraction exercises.  He self propelled w/c~10 ft in hallway using R UE and L LE. Pt reported decreased ability to complete function w/c propulsion with L UE due to pain and decreased grip strength. Pt taken remainder of way back to room total A. Pt left sitting up in w/c at end of session, all needs in reach, educated regarding use of call bell for assist.   OT Evaluation Precautions/Restrictions  Precautions Precautions: Fall Precaution Comments: no specific weight bearing order but avoided weight bearing through L wrist; wound to right LE and groin Restrictions Weight Bearing Restrictions: Yes RLE Weight Bearing: Non weight bearing Other Position/Activity Restrictions: R wrist General Chart Reviewed: Yes Pain Pain Assessment Pain Assessment: 0-10 Pain Score: 8  Pain Type: Acute pain Pain Location: Wrist Pain Orientation: Left Pain Descriptors / Indicators: Sore Pain Onset: On-going Pain Intervention(s): Ambulation/increased activity;Repositioned Multiple Pain Sites: Yes 2nd Pain Site Pain Score: 8 Pain Type: Acute pain Pain Location: Leg Pain Orientation: Right Pain Descriptors / Indicators: Aching Pain Onset: On-going Pain Intervention(s): Ambulation/increased activity;Repositioned Home Living/Prior Functioning Home Living Available Help at Discharge: Family, Available 24 hours/day Type of Home: House Home Access: Stairs to enter Technical brewer of Steps: 4 Entrance Stairs-Rails: Right, Left Home Layout: Two level, 1/2 bath on main level, Bed/bath upstairs Alternate Level Stairs-Number of Steps: Flight Alternate Level Stairs-Rails: Can reach both Bathroom  Shower/Tub: Multimedia programmer: Handicapped height Bathroom Accessibility: Yes  Lives With: Spouse, Other (Comment) (2 sons and grand son) ADL   Vision/Perception  Vision- History Baseline Vision/History: Wears glasses Wears Glasses: Reading only Patient Visual Report: No change from baseline Vision- Assessment Vision Assessment?: No apparent visual deficits Perception Comments: wfl  Cognition Overall Cognitive Status: Within Functional Limits for tasks assessed Arousal/Alertness: Awake/alert Orientation Level: Person;Place;Situation Person: Oriented Place: Oriented Situation: Oriented Year: 2016 Month: October Day of Week: Incorrect Michela Pitcher it was Friday) Memory: Impaired Memory Impairment: Decreased recall of new information Immediate Memory Recall: Sock;Blue;Bed Memory Recall: Sock;Blue;Bed Memory Recall Sock: Without Cue Memory Recall Blue: Without Cue Memory Recall Bed: Without Cue Attention: Focused;Sustained Focused Attention: Appears intact Sustained Attention: Appears intact Awareness: Appears intact Problem Solving: Impaired Problem Solving Impairment: Functional complex Safety/Judgment: Appears intact Sensation Sensation Light Touch: Impaired Detail Light Touch Impaired Details: Impaired RLE;Impaired LUE Stereognosis: Not tested Hot/Cold: Not tested Proprioception: Appears Intact Proprioception Impaired Details: Impaired RLE Additional Comments: R LE knee to foot almost numb but not quite; L fingers tingly Coordination Gross Motor Movements are Fluid and Coordinated: No Fine Motor Movements are Fluid and Coordinated: No Coordination and Movement Description: Decreased L finger opposition. R finger opposition intact Finger Nose Finger Test: wfl R UE; slowed movement but path and accuracy wfl L UE Heel Shin Test: wfl L LE; unable to complete on R LE due to pain Motor  Motor Motor: Within Functional Limits Mobility  Bed Mobility Bed  Mobility: Rolling Right;Rolling Left;Right Sidelying to Sit;Left Sidelying to Sit;Sit to Supine Rolling Right: 5: Supervision Rolling Right Details: Visual cues/gestures for precautions/safety;Verbal cues for precautions/safety Rolling Left:  5: Supervision Rolling Left Details: Visual cues/gestures for precautions/safety;Verbal cues for precautions/safety Right Sidelying to Sit: 4: Min assist Right Sidelying to Sit Details: Manual facilitation for placement;Verbal cues for sequencing;Verbal cues for technique;Verbal cues for precautions/safety Left Sidelying to Sit: 5: Supervision Left Sidelying to Sit Details: Verbal cues for precautions/safety;Verbal cues for technique Sit to Supine: 5: Supervision Sit to Supine - Details: Visual cues/gestures for precautions/safety;Verbal cues for precautions/safety Transfers Sit to Stand: 4: Min assist;With upper extremity assist;From bed Sit to Stand Details: Verbal cues for safe use of DME/AE;Manual facilitation for placement Stand to Sit: 4: Min guard;With armrests;With upper extremity assist  Trunk/Postural Assessment  Cervical Assessment Cervical Assessment: Within Functional Limits Thoracic Assessment Thoracic Assessment: Within Functional Limits Lumbar Assessment Lumbar Assessment: Within Functional Limits Postural Control Postural Control: Within Functional Limits  Balance Balance Balance Assessed: Yes Static Sitting Balance Static Sitting - Balance Support: Feet supported Static Sitting - Level of Assistance: 5: Stand by assistance Dynamic Sitting Balance Dynamic Sitting - Balance Support: Feet supported Dynamic Sitting - Level of Assistance: 4: Min assist Dynamic Sitting - Balance Activities: Lateral lean/weight shifting;Forward lean/weight shifting;Reaching for objects Sitting balance - Comments: During bathing/dressing seated in w/c at sink Static Standing Balance Static Standing - Balance Support: During functional activity;Right  upper extremity supported Static Standing - Level of Assistance: 4: Min assist Static Standing - Comment/# of Minutes: Standing to complete bathing/ dressing task Dynamic Standing Balance Dynamic Standing - Balance Support: During functional activity;Right upper extremity supported Dynamic Standing - Level of Assistance: 4: Min assist Dynamic Standing - Balance Activities: Reaching across midline;Reaching for objects Dynamic Standing - Comments: Standing to complete LB bathing/dressing Extremity/Trunk Assessment RUE Assessment RUE Assessment: Within Functional Limits LUE Assessment LUE Assessment: Exceptions to WFL LUE AROM (degrees) Overall AROM Left Upper Extremity: Deficits;Due to precautions;Due to pain LUE Overall AROM Comments: shoulder & elbow wfl; finger extension limited 25%; finger flexion limited 25%; decreased scapular mobilization LUE Strength LUE Overall Strength: Deficits;Due to precautions;Due to pain LUE Overall Strength Comments: shoulder not tested due to pain, elbow grossly 4/5; finger 2+/5   See Function Navigator for Current Functional Status.   Refer to Care Plan for Long Term Goals  Recommendations for other services: None  Discharge Criteria: Patient will be discharged from OT if patient refuses treatment 3 consecutive times without medical reason, if treatment goals not met, if there is a change in medical status, if patient makes no progress towards goals or if patient is discharged from hospital.  The above assessment, treatment plan, treatment alternatives and goals were discussed and mutually agreed upon: by patient  Ernestina Patches 05/11/2015, 12:39 PM

## 2015-05-11 NOTE — Progress Notes (Signed)
S:asked to see pt again regarding xray findings, and wrist pain  O:Blood pressure 96/61, pulse 71, temperature 97.7 F (36.5 C), temperature source Oral, resp. rate 18, height 6\' 3"  (1.905 m), weight 102.059 kg (225 lb), SpO2 97 %.  Splint removed, still with some generalized edema of hand and fingers. Small scab where previous i&d site was, with minimal surrounding erythema, no drainage, not able to express anything with squeezing area around wound, generalized discomfort with wrist movement, no extreme pain to suggest acutely septic joint.  A:wrist pain, s/p i&d, xray findings suggestive of osteo, with joint collapse...sclerosis   P:I don't see anything that needs acute surgical drainage, would treat with 6 wks abx as if were osteo, cont splint for comfort.  If area dorsal wrist starts draining, will re i&d.  Pt may need repeat CT/MRI to look for devitalized bone that needs excision.

## 2015-05-11 NOTE — Progress Notes (Signed)
Subjective/Complaints: Has had some increased wrist pain over the last couple days. Has not noticed much change in swelling. Some pinkness where prior incision was. No increased numbness or tingling in the fingers. Has has some chronic problems with this. No other joint swelling Right thigh feels less tight, able to move it better  No SOB or CP    Objective: Vital Signs: Blood pressure 112/72, pulse 89, temperature 98.1 F (36.7 C), temperature source Oral, resp. rate 17, height  (1.905 m), weight 102.059 kg (225 lb), SpO2 98 %. Dg Wrist Complete Left  05/10/2015  CLINICAL DATA:  Left wrist pain and swelling and warmth, noted for 3 days. EXAM: LEFT WRIST - COMPLETE 3+ VIEW COMPARISON:  Left hand and wrist MRI, 04/07/2015. Left wrist radiographs, 04/07/2015. FINDINGS: Since prior radiographs, there has been a marked change. There is now marked loss of the joint spaces, specifically the radiocarpal joints and the midcarpal articulations. There is also joint space narrowing between the distal carpal row and the bases of the metacarpals. Bones are demineralized. There is a smudgy indistinctness of the trabecular pattern particularly notable in the distal radius. There is a bony fragment along the dorsal margin of the wrist on the lateral view is stable from the prior study. There is a small bone fragment along the radial margin of the ulnar styloid which was not evident previously. This could potentially reflect a small fracture at the insertion of the triangle fibrocartilage complex. No other evidence of a recent fracture. There is diffuse soft tissue swelling. There is some density in the soft tissues along the radial margin of the distal ulna, nonspecific. IMPRESSION: 1. Marked change from the prior wrist radiographs. There are now advanced arthropathic changes, developing since the prior study, which involve the radiocarpal joints and the intercarpal and carpometacarpal articulations, with  marked loss of the joint spaces. The bony trabecula are less distinct. There is some bone demineralization suggested of the distal radius and ulna and carpus. Diffuse surrounding soft tissue swelling is evident. Findings are consistent with an inflammatory arthropathy or possibly is diffuse septic arthritis. Electronically Signed   By: Amie Portland M.D.   On: 05/10/2015 18:18   Results for orders placed or performed during the hospital encounter of 05/10/15 (from the past 72 hour(s))  Sedimentation rate     Status: Abnormal   Collection Time: 05/10/15  6:00 PM  Result Value Ref Range   Sed Rate 111 (H) 0 - 16 mm/hr  Uric acid     Status: Abnormal   Collection Time: 05/10/15  6:00 PM  Result Value Ref Range   Uric Acid, Serum 2.3 (L) 4.4 - 7.6 mg/dL  CBC with Differential/Platelet     Status: Abnormal   Collection Time: 05/10/15  9:50 PM  Result Value Ref Range   WBC 8.0 4.0 - 10.5 K/uL   RBC 3.33 (L) 4.22 - 5.81 MIL/uL   Hemoglobin 9.0 (L) 13.0 - 17.0 g/dL   HCT 16.1 (L) 09.6 - 04.5 %   MCV 85.9 78.0 - 100.0 fL   MCH 27.0 26.0 - 34.0 pg   MCHC 31.5 30.0 - 36.0 g/dL   RDW 40.9 (H) 81.1 - 91.4 %   Platelets 365 150 - 400 K/uL   Neutrophils Relative % 42 %   Neutro Abs 3.3 1.7 - 7.7 K/uL   Lymphocytes Relative 44 %   Lymphs Abs 3.5 0.7 - 4.0 K/uL   Monocytes Relative 10 %   Monocytes Absolute  0.8 0.1 - 1.0 K/uL   Eosinophils Relative 3 %   Eosinophils Absolute 0.2 0.0 - 0.7 K/uL   Basophils Relative 1 %   Basophils Absolute 0.1 0.0 - 0.1 K/uL  Protime-INR     Status: Abnormal   Collection Time: 05/11/15  5:10 AM  Result Value Ref Range   Prothrombin Time 23.5 (H) 11.6 - 15.2 seconds   INR 2.11 (H) 0.00 - 1.49     HEENT: normal Cardio: RRR and no murmur Resp: CTA B/L and unlabored GI: BS positive and NT, ND Extremity:  Pulses positive and No Edema Skin:   Breakdown Right foot with erythema and small areas of black eschar on great toe a.  Left wrist has mild erythema dorsal  ulnar aspect Neuro: Alert/Oriented, Anxious, Abnormal Sensory reduced sensation to LT in left fingers and Right medial and ant thigh and right medial leg   Abnormal Motor 4- R hip add, 5/5 left Hip add  4/5 left  Knee ext , 2- RIght Knee ext , 3- Left hand grip, 3- Right ankle , 4- Left ankle , RUE normal Musc/Skel:  Other healed incision bilateral knee, reduced range of motion left wrist has dorsum of the hand edema as well as forearm edema. No significant joint swelling appreciated. Mild pain with gentle range of motion. More pain with supination pronation then with flexion and extension of the wrist. No pain with finger flexion or extension Gen NAD except complained of fatigue with minmal activity   Assessment/Plan: 1. 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 Physiatrist is providing close team supervision and 24 hour management of active medical problems listed below. Physiatrist and rehab team continue to assess barriers to discharge/monitor patient progress toward functional and medical goals.  FIM: Function - Bathing Position: Wheelchair/chair at sink Body parts bathed by patient: Right arm, Left arm, Chest, Abdomen       Medical Problem List and Plan: 1. 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  DVT Prophylaxis/Anticoagulation: Coumadin for thromboembolism. Monitor for any bleeding episodes 3. Pain Management: MS Contin 90 mg twice a day, oxycodone for breakthrough pain. Monitor with increased mobility, at this point not much nerve pain in RLE 4. Mood:  Effexor XR 75 mg daily, Klonopin 0.5 mg 4 times a day as needed 5. Neuropsych: This patient is capable of making decisions on his own behalf. 6. Skin/Wound Care: Skin care is supervised to right foot. 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 Lajoyce Cornersuda, still in process of demarcation 7. Fluids/Electrolytes/Nutrition: Strict I and O's with follow-up chemistries 8.ID. Presently on Ancef 2 g 3 times a day. ID rec total 6 wks, UCx showed no growth so no changes needed 9. Tachycardia SVT. Follow-up cardiology services. Continue Lopressor 50 mg twice a day for now, HR ok at 91 this am 10. Constipation. Laxitive  assistance, per pt doing well 11.  Severe protein malnutrition, alb 1.6- supplement with beneprotein, enc hi protein diet choices Urinary retention- looks ready for voiding trial 12. Left wrist pain. History of distal radial fracture. X-rays indicate collapse of joint space over the last 6 weeks or so. In discussion with hand surgery does not appear to  be an acutely septic joint. They will evaluate today. We'll continue wrist splint. No prednisone recommended as this does not appear to be gout          Function - Comprehension Comprehension: Auditory Comprehension assist level: Follows complex conversation/direction with extra time/assistive device  Function - Expression Expression: Verbal Expression assist level: Expresses complex ideas: With extra time/assistive device  Function - Social Interaction Social Interaction assist level: Interacts appropriately with others with medication or extra time (anti-anxiety, antidepressant).  Function - Problem Solving Problem solving assist level: Solves basic problems with no assist  Function - Memory Memory assist level: Recognizes or recalls 90% of the time/requires cueing < 10% of the time Patient normally able to recall (first 3 days only): Current season, Location of own room, Staff names and faces, That he  or she is in a hospital    LOS (Days) 1 A FACE TO FACE EVALUATION WAS PERFORMED  Stephens Shreve E 05/11/2015, 12:08 PM

## 2015-05-11 NOTE — Progress Notes (Signed)
Initial Nutrition Assessment  INTERVENTION:   Ensure Enlive po BID, each supplement provides 350 kcal and 20 grams of protein   NUTRITION DIAGNOSIS:   Increased nutrient needs related to wound healing as evidenced by estimated needs.  GOAL:   Patient will meet greater than or equal to 90% of their needs  MONITOR:   PO intake, Supplement acceptance, Labs, Weight trends, Skin, I & O's  REASON FOR ASSESSMENT:   Malnutrition Screening Tool    ASSESSMENT:   Pt with recent hospital admission and found to be septic with acute kidney injury and left hand cellulitis, and a right inguinal/thigh abscess from injecting testosterone . He was ultimately diagnoses w/ MSSA bacteremia and underwent multiple debridements of the right thigh/inguinal area and left wrist, to include a muscle flap closure of his extensive R femoral region wound w/ exposed vessels. He was discharged to [facility] on 04/30/2015 but returned to acute hospital after he developed SVT 10/12. Pt d/c'ed back to [facility] 10/14.    Nutrition-Focused physical exam completed. Findings are no fat depletion, mild muscle depletion, and severe edema LUE and LLE non nutrition related.  Per pt his appetite is coming back in the last few days. He feels he has lost weight reports usual weight 230 lb. Spoke with RN, pt currently NPO for possible wrist surgery.   Diet Order:  Diet NPO time specified  Skin:  Wound (see comment) (left wrist,right thigh,unstageable right heel,dryeschar toes)  Last BM:  10/13  Height:   Ht Readings from Last 1 Encounters:  05/10/15 6\' 3"  (1.905 m)    Weight:   Wt Readings from Last 1 Encounters:  05/10/15 225 lb (102.059 kg)    Ideal Body Weight:  89.1 kg  BMI:  Body mass index is 28.12 kg/(m^2).  Estimated Nutritional Needs:   Kcal:  2200-2400  Protein:  115-130 grams  Fluid:  > 2.2 L/day  EDUCATION NEEDS:   No education needs identified at this time  Kendell BaneHeather Korin Setzler RD, LDN, CNSC [phone]  Pager [phone] After Hours Pager

## 2015-05-11 NOTE — Evaluation (Addendum)
Physical Therapy Assessment and Plan  Patient Details  Name: [name] MRN: [id number] Date of Birth: October 14, 1955  PT Diagnosis: Abnormality of gait, Cognitive deficits, Difficulty walking, Edema, Impaired cognition, Impaired sensation, Muscle weakness and Pain in L hand and R leg Rehab Potential: Good ELOS: 14-17 days   Today's Date: 05/11/2015 PT Individual Time: 0900-1000 Treatment Session 2: 1415-1445 PT Individual Time Calculation (min): 60 min  Treatment Session 2: 30 min  Problem List:  Patient Active Problem List   Diagnosis Date Noted  . Dry gangrene ([place])   . Wound, open, foot with complication   . Acute deep vein thrombosis (DVT) of distal end of right lower extremity ([place])   . Left wrist fracture   . Abscess of bursa, left wrist   . SVT (supraventricular tachycardia) ([facility]) 05/08/2015  . 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 Adjustment disorder with depressed mood   . 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 Debility 05/01/2015  . Sepsis ([place]) 04/30/2015  . Acute renal failure with tubular necrosis ([place])   . Abscess of left hand   . Staphylococcus aureus bacteremia with sepsis ([place])   . Colles' fracture of left radius   . Cellulitis of left upper extremity   . Diastolic dysfunction   . Nondisplaced fracture of distal end of left radius   . Abscess   . Bacteremia due to Staphylococcus   . Pulmonary hypertension ([place])   . Acute blood loss anemia   . 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 DVT, lower extremity ([place])   . Radius distal fracture   . Cellulitis of finger of left hand   . Acute pyelonephritis   . Arm pain, anterior   . Cellulitis   . Bacteremia due to Staphylococcus aureus   . Acute renal failure syndrome ([place])   . Fracture of left radius   . ARF (acute renal failure) ([place]) 04/07/2015  . Severe sepsis ([place]) 04/07/2015  . Cellulitis of left hand 04/07/2015  . Elevated INR 04/07/2015  . Foot swelling 04/07/2015  .  Elevated bilirubin 04/07/2015  . Hypokalemia 04/07/2015  . Hypomagnesemia 04/07/2015  . Femoral artery aneurysm, right ([place]) 04/07/2015  . Bacteremia 04/07/2015  . UTI (lower urinary tract infection)   . Chest pain 11/15/2013  . History of duodenal ulcer 11/15/2013  . 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 Septic shock due to abscess 07/13/2013  . Chronic use of opiate drugs therapeutic purposes 07/12/2013  . Acute renal failure ([place]) 07/11/2013  . Chronic pain syndrome 07/11/2013  . Testosterone deficiency 07/11/2013    Past Medical History:  Past Medical History  Diagnosis Date  . Hypertension   . Chronic knee pain   . Hyperlipemia   . PUD (peptic ulcer disease)   . Dysrhythmia   . Peripheral vascular disease ([place])   . Anxiety   . GERD (gastroesophageal reflux disease)    Past Surgical History:  Past Surgical History  Procedure Laterality Date  . I&d extremity Left 07/12/2013    Procedure: I&D Left Thigh Abscess;  Surgeon: Wylene Simmer, MD;  Location: [place] [facility];  Service: Orthopedics;  Laterality: Left;  . Replacement total knee bilateral    . Tonsillectomy    . Hernia repair    . Cholecystectomy    . Appendectomy    . Shoulder surgery    . Elbow surgery    . Femoral artery exploration Right 04/11/2015    Procedure: Resection of infected right femoral artery aneurysm;  Surgeon: Conrad Harrisburg, MD;  Location: [place];  Service: Vascular;  Laterality: Right;  . Incision and drainage abscess Right 04/11/2015    Procedure: INCISION AND DRAINAGE OF RIGHT THIGH ABSCESS;  Surgeon: Conrad Mustang Ridge, MD;  Location: [place];  Service: Vascular;  Laterality: Right;  . Incision and drainage abscess Right 04/13/2015    Procedure: INCISION AND DRAINAGE THIGH ABSCESS;  Surgeon: Conrad Webb, MD;  Location: [facility];  Service: Vascular;  Laterality: Right;  . I&d extremity Left 04/17/2015    Procedure: IRRIGATION AND DEBRIDEMENT LEFT EXTREMITY;  Surgeon: Conrad , MD;  Location: [place];  Service: Vascular;   Laterality: Left;  Marland Kitchen Muscle flap closure Right 04/19/2015    Procedure: RIGHT RECTUS ABDOMINUS  FLAP TO RIGHT GROIN;  Surgeon: Irene Limbo, MD;  Location: [place] [facility];  Service: Plastics;  Laterality: Right;  . Femoral revision Right 04/19/2015    Procedure: OVERSEW RIGHT FEMORAL VEIN ;  Surgeon: Angelia Mould, MD;  Location: [facility];  Service: Vascular;  Laterality: Right;  . Wound exploration Right 04/23/2015    Procedure: EXAM UNDER ANESTHESIA AND VAC CHANGE RIGHT THIGH;  Surgeon: Irene Limbo, MD;  Location: [place];  Service: Plastics;  Laterality: Right;    Assessment & Plan Clinical Impression: [name] is a 59 y.o. right handed male history of hypertension, chronic knee pain, peptic ulcer disease, MRSA abscesses, peripheral neuropathy, hypogonadism taking testosterone supplementation. Independent with occasional cane and walker prior to admission living with wife, in a home with facilities on the first floor and 4 steps to enter. Presented 04/07/2015 with recent fall 04/03/2015 with increasing left hand and wrist pain. In the ED noted to be hypotensive tachycardia and white blood cell count of 31,300. Placed on broad-spectrum anti-biotic's. MRI of left hand show dorsal distal radius transverse fracture, diffuse edema of the hand likely reactive to distal radius fracture, tear of the dorsal band of the scaphounate ligament. Multiple small deep fluid collections in the hands compatible with hematoma question superimposed infection. CT of right femur due to pain showed a 7.4 cm process involving or adjacent to the proximal right SFA question aneurysm, pseudoaneurysm or hematoma. 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 Bridgett Larsson. Follow-up infectious disease blood cultures positive bacillus/MSSA and remains on broad-spectrum antibiotics as advised. Patient returned to the OR  04/13/2015 for a repeat incision and drainage of thigh abscess negative pressure dressing placement as well as washout of right thigh abscess cavity 04/15/2015 and 04/17/2015 with rectus flap procedure 04/19/2015 per plastic surgery Dr Iran Planas and wound VAC change 04/23/2015. . TTE did not show valvular insufficiency or abscess. Hospital course tachycardia/SVT (requiring adenosine) with cardiology services consulted Dr. Einar Gip. Echocardiogram with ejection fraction of 23% grade 1 diastolic dysfunction. In regards to abscess of left dorsal hand underwent incision and drainage 04/23/2015 per Dr. Lenon Curt. Vascular study left upper extremity shows no DVT. Patient developed gangrenous thromboembolic injury to the right foot and toes with consult Dr. Sharol Given. No plan for invasive procedures at this time with wound care nurse follow-up for skin care. Patient placed on Coumadin for thromboembolism as per Dr. Sharol Given. Physical and occupational therapy evaluations completed 04/24/2015 with recommendations of physical medicine rehabilitation consult. Patient was admitted for comprehensive rehabilitation program 04/29/2015 with slow progressive gains. On the early morning of 05/08/2015 patient with nonspecific chest discomfort. EKG revealed SVT with heart rate 168. Troponin negative.  Blood pressure was a bit soft at 103/70. Cardiology services consulted patient was discharged to acute care services for ongoing follow-up in regards to SVT and placed on flecainide 100 mg twice daily. Repeat EKG normal sinus rhythm without ischemia. Patient did receive follow-up from orthopedic services Dr. Sharol Given for thromboembolic disease to the right foot with dry gangrenous changes. Patient remained on chronic Coumadin therapy. Orders for Santyl dressing changes to the black eschar areas of the right foot. There was possible need for minimal amputation surgery which will be addressed as an outpatient. Remains on Ancef as indicated by infectious disease  for MSSA bacteremia through October 31 and stop. Progressive left wrist pain with swelling edema suspect possible gouty flareup.  Patient transferred to CIR on 05/10/2015 .   Patient currently requires min with mobility secondary to muscle weakness and muscle joint tightness, decreased cardiorespiratoy endurance, decreased coordination, decreased memory and decreased standing balance.  Prior to hospitalization, patient was modified independent  with mobility and lived with Spouse, Other (Comment) in a House home.  Home access is 5 through garage; 4 through frontStairs to enter.  Patient will benefit from skilled PT intervention to maximize safe functional mobility, minimize fall risk and decrease caregiver burden for planned discharge home with 24 hour assist.  Anticipate patient will benefit from follow up [facility] at discharge.  PT - End of Session Activity Tolerance: Tolerates < 10 min activity with changes in vital signs Endurance Deficit: Yes Endurance Deficit Description: cardiorespiratory PT Assessment Rehab Potential (ACUTE/IP ONLY): Good Barriers to Discharge: Inaccessible home environment PT Patient demonstrates impairments in the following area(s): Balance;Edema;Endurance;Motor;Pain;Safety;Sensory;Skin Integrity PT Transfers Functional Problem(s): Bed Mobility;Bed to Chair;Car;Furniture PT Locomotion Functional Problem(s): Ambulation;Wheelchair Mobility;Stairs PT Plan PT Intensity: Minimum of 1-2 x/day ,45 to 90 minutes PT Frequency: 5 out of 7 days PT Duration Estimated Length of Stay: 14-17 days PT Treatment/Interventions: Ambulation/gait training;DME/adaptive equipment instruction;Psychosocial support;UE/LE Strength taining/ROM;Balance/vestibular training;Skin care/wound management;UE/LE Coordination activities;Functional electrical stimulation;Cognitive remediation/compensation;Functional mobility training;Splinting/orthotics;Community reintegration;Neuromuscular re-education;Stair  training;Wheelchair propulsion/positioning;Discharge planning;Pain management;Therapeutic Activities;Disease management/prevention;Patient/family education;Therapeutic Exercise PT Transfers Anticipated Outcome(s): Mod I PT Locomotion Anticipated Outcome(s): Supervision gait and w/c mobility; min A stairs PT Recommendation Recommendations for Other Services: Neuropsych consult Follow Up Recommendations: 24 hour supervision/assistance;Home health PT Patient destination: Home Equipment Recommended: To be determined Equipment Details: Pt already owns RW and [facility]  Skilled Therapeutic Intervention Treatment Session 1: PT Evaluation - pt received in bed, known to this PT from very recent IPR admission. Pt presents with pain limiting function, especially with movement or weightbearing through R LE, though swelling subjectively is much improved since last rehab stay. Pain and swelling also limit pt's use of L hand and L UE in general as pt is avoiding all use of this extremity. Pt grossly req up to min A for mobility and has very low activity tolerance, req multiple rest breaks throughout PT evaluation. Therapeutic Exercise - PT instructs pt in AROM exercises to R LE: supine hip abduction/adduction and heel slides - partial ROM as tolerated x 10 reps each. Therapeutic Activity - Pt req Supervision to roll on flat air mattress without bedrails, min A R side lie to sit, but on only supervision for sit to supine and L side lie to sit transfer. PT assembled L Platform RW to accommodate pt's L wrist pain and pt req min A to stand from eob, min A stand-pivot transfer bed to/from w/c. W/C Management - See function tab for details - pt uses R UE and L LE to self  propel manual w/c req verbal cues for encouragement - good steering. Pt ended in bed, as OT requested for her evaluation immediately following PT, with all needs in reach.   Treatment Session 2: Pt received in bed eating - surgeon visited and decided to just  "watch" pt's  Lhand, for now, so pt no longer NPO. Gait Training: PT instructs pt in ambulation with L PFRW x 15' - focus is on putting as much weight through R LE as possible. Stair Training - see function tab for details - pt completes one 6" height step up with L leg, down backwards with R leg using B handrails and min A. Therapeutic Activity - PT instructs pt in car transfer with [place] req steadying assist for safety stand-pivot. Pt ended in bed with same level of assist as stated above - all needs in reach. Continue per PT POC.    PT Evaluation Precautions/Restrictions Precautions Precautions: Fall Precaution Comments: no specific weight bearing order but avoided weight bearing through L wrist; wound to right LE and groin Restrictions Weight Bearing Restrictions: Yes RLE Weight Bearing: Non weight bearing General Chart Reviewed: Yes Family/Caregiver Present: No  Vital SignsTherapy Vitals Pulse Rate: 89 BP: 112/72 mmHg Patient Position (if appropriate): Lying Oxygen Therapy SpO2: 98 % O2 Device: Not Delivered Pain Pain Assessment Pain Assessment: 0-10 Pain Score: 9  Pain Type: Acute pain Pain Location: Wrist Pain Orientation: Left Pain Descriptors / Indicators: Sharp Pain Onset: On-going Pain Intervention(s): Rest;Emotional support Multiple Pain Sites: Yes 2nd Pain Site Pain Score: 9 Pain Type: Acute pain Pain Location: Leg Pain Orientation: Right Pain Descriptors / Indicators: Sharp Pain Onset: On-going Pain Intervention(s): Rest;Emotional support  Treatment Session 2: Pt reports 8/10 pain in L arm and R leg - PT uses rest breaks and emotional support to decrease pain.   Home Living/Prior Functioning Home Living Available Help at Discharge: Family;Available 24 hours/day Type of Home: House Home Access: Stairs to enter [employer] of Steps: 5 through garage; 4 through front Entrance Stairs-Rails: Can reach both (through garage) Home Layout: Two level;1/2  bath on main level;Bed/bath upstairs Alternate Level Stairs-Number of Steps: Flight Alternate Level Stairs-Rails: Can reach both Bathroom Shower/Tub: Multimedia programmer: Handicapped height Bathroom Accessibility: Yes  Lives With: Spouse;Other (Comment) Prior Function Level of Independence: Requires assistive device for independence  Able to Take Stairs?: Yes Driving: No Vocation Requirements: awaiting disability Comments: Used to love playing golf and basketball; likes to watch sports on TV.  Is now on disability, former [employer] Vision/Perception  Perception Comments: wfl  Cognition Overall Cognitive Status: Within Functional Limits for tasks assessed Arousal/Alertness: Awake/alert Orientation Level: Oriented X4 Attention: Focused;Sustained Focused Attention: Appears intact Sustained Attention: Appears intact Memory: Impaired Memory Impairment: Decreased recall of new information Awareness: Appears intact Problem Solving: Impaired Problem Solving Impairment: Functional complex Safety/Judgment: Appears intact Sensation Sensation Light Touch: Impaired Detail Light Touch Impaired Details: Impaired RLE;Impaired LUE Stereognosis: Not tested Hot/Cold: Not tested Proprioception: Appears Intact Additional Comments: R LE knee to foot almost numb but not quite; L fingers tingly Coordination Gross Motor Movements are Fluid and Coordinated: No Fine Motor Movements are Fluid and Coordinated: No Coordination and Movement Description: R LE antalgic movement and L fingers swollen, weak, and likely learned nonuse Finger Nose Finger Test: wfl R UE; slowed movement but path and accuracy wfl L UE Heel Shin Test: wfl L LE; unable to complete on R LE due to pain Motor  Motor Motor: Within Functional Limits  Mobility Bed  Mobility Bed Mobility: Rolling Right;Rolling Left;Right Sidelying to Sit;Left Sidelying to Sit;Sit to Supine Rolling Right: 5:  Supervision Rolling Right Details: Visual cues/gestures for precautions/safety;Verbal cues for precautions/safety Rolling Left: 5: Supervision Rolling Left Details: Visual cues/gestures for precautions/safety;Verbal cues for precautions/safety Right Sidelying to Sit: 4: Min assist Right Sidelying to Sit Details: Manual facilitation for placement;Verbal cues for sequencing;Verbal cues for technique;Verbal cues for precautions/safety Left Sidelying to Sit: 5: Supervision Left Sidelying to Sit Details: Verbal cues for precautions/safety;Verbal cues for technique Sit to Supine: 5: Supervision Sit to Supine - Details: Visual cues/gestures for precautions/safety;Verbal cues for precautions/safety Transfers Transfers: Yes Sit to Stand: 4: Min assist;With upper extremity assist;From bed Sit to Stand Details: Verbal cues for safe use of DME/AE;Manual facilitation for placement Stand to Sit: 4: Min guard;With armrests;With upper extremity assist Stand Pivot Transfers: 4: Min assist Stand Pivot Transfer Details: Manual facilitation for placement;Verbal cues for precautions/safety Locomotion  Wheelchair Mobility Wheelchair Mobility: Yes Wheelchair Assistance: 5: Supervision Wheelchair Assistance Details: Verbal cues for precautions/safety;Verbal cues for technique Wheelchair Propulsion: Right upper extremity;Left lower extremity Wheelchair Parts Management: Supervision/cueing Distance: 120'  Trunk/Postural Assessment  Cervical Assessment Cervical Assessment: Within Functional Limits Thoracic Assessment Thoracic Assessment: Within Functional Limits Lumbar Assessment Lumbar Assessment: Within Functional Limits Postural Control Postural Control: Within Functional Limits  Balance Balance Balance Assessed: Yes Static Sitting Balance Static Sitting - Balance Support: Bilateral upper extremity supported Static Sitting - Level of Assistance: 5: Stand by assistance Dynamic Sitting Balance Dynamic  Sitting - Balance Support: Feet supported;Bilateral upper extremity supported Dynamic Sitting - Level of Assistance: 4: Min assist Static Standing Balance Static Standing - Balance Support: Bilateral upper extremity supported;During functional activity Static Standing - Level of Assistance: 4: Min assist Dynamic Standing Balance Dynamic Standing - Balance Support: Bilateral upper extremity supported;During functional activity Dynamic Standing - Level of Assistance: 4: Min assist Extremity Assessment  RUE Assessment RUE Assessment: Within Functional Limits LUE Assessment LUE Assessment: Exceptions to WFL LUE AROM (degrees) Overall AROM Left Upper Extremity: Deficits;Due to precautions;Due to pain LUE Overall AROM Comments: shoulder & elbow wfl; finger extension limited 25%; finger flexion limited 25% LUE Strength LUE Overall Strength: Deficits;Due to precautions;Due to pain LUE Overall Strength Comments: shoulder 3/5; elbow grossly 4/5; finger 2+/5 RLE Assessment RLE Assessment: Exceptions to [facility] RLE AROM (degrees) Overall AROM Right Lower Extremity: Deficits;Due to decreased strength;Due to pain RLE Overall AROM Comments: hip flexion limited 75%; knee rom limited 50%; ankle rom limited 25% RLE Strength RLE Overall Strength: Deficits;Due to pain RLE Overall Strength Comments: grossly 2+/5 throughout LLE Assessment LLE Assessment: Within Functional Limits   See Function Navigator for Current Functional Status.   Refer to Care Plan for Long Term Goals  Recommendations for other services: Neuropsych  Discharge Criteria: Patient will be discharged from PT if patient refuses treatment 3 consecutive times without medical reason, if treatment goals not met, if there is a change in medical status, if patient makes no progress towards goals or if patient is discharged from hospital.  The above assessment, treatment plan, treatment alternatives and goals were discussed and mutually  agreed upon: by patient  [facility] M 05/11/2015, 11:57 AM

## 2015-05-12 ENCOUNTER — Inpatient Hospital Stay (HOSPITAL_COMMUNITY): Payer: Medicaid Other | Admitting: Physical Therapy

## 2015-05-12 LAB — PROTIME-INR
INR: 2.15 — AB (ref 0.00–1.49)
PROTHROMBIN TIME: 23.8 s — AB (ref 11.6–15.2)

## 2015-05-12 MED ORDER — WARFARIN SODIUM 7.5 MG PO TABS
7.5000 mg | ORAL_TABLET | Freq: Once | ORAL | Status: AC
  Administered 2015-05-12: 7.5 mg via ORAL
  Filled 2015-05-12: qty 1

## 2015-05-12 NOTE — Progress Notes (Signed)
ANTICOAGULATION CONSULT NOTE - Follow Up Consult  Pharmacy Consult for Coumadin Indication: DVT    Labs:  Recent Labs  05/10/15 0315 05/10/15 2150 05/11/15 0510 05/12/15 0500  HGB  --  9.0*  --   --   HCT  --  28.6*  --   --   PLT  --  365  --   --   LABPROT 25.9*  --  23.5* 23.8*  INR 2.40*  --  2.11* 2.15*     Assessment: 59 y/o M transferred back to rehab. 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 Found to have R lower extremity DVT on 04/18/15. INR 2.15 today.    Goal of Therapy:  INR 2-3 Monitor platelets by anticoagulation protocol: Yes   Plan:  - Warfarin 7.5 mg PO x1; long term may need 7.5/5 mg alternating regimen. - Daily INR while inpatient  Greggory Stallionristy Reyes, PharmD Clinical Pharmacy Resident Pager # [phone] 05/12/2015 9:35 AM

## 2015-05-12 NOTE — Progress Notes (Signed)
Physical Therapy Session Note  Patient Details  Name: [name] MRN: [id number] Date of Birth: 1955-10-31  Today's Date: 05/12/2015 PT Individual Time: 1440-1525 PT Individual Time Calculation (min): 45 min   Short Term Goals: Week 1:  PT Short Term Goal 1 (Week 1): Pt will initiate ambulation during PT session.  PT Short Term Goal 2 (Week 1): Pt will tolerate sitting up in w/c oob x 4 hours.  PT Short Term Goal 3 (Week 1): Pt will initiate stand-pivot bed to/from w/c transfers.  PT Short Term Goal 4 (Week 1): Pt will initiate stair training during PT session.  PT Short Term Goal 5 (Week 1): Pt will demonstrate weight bearing through R heel during stand-pivot transfers in order to increase functional use of the extremity.   Skilled Therapeutic Interventions/Progress Updates:   Session focused on functional mobility training, BLE strengthening/ROM, and activity tolerance. Patient performed squat pivot transfer to wheelchair with supervision after setup assist. Propelled wheelchair using RUE and LLE x 130 ft with supervision, limited by LLE fatigue. Gait using PFRW x 27 ft with min A and patient demonstrating forward flexed posture, antalgic gait pattern, and decreased RLE weight bearing. Seated BLE therex: 2 x 10 each LE hip flexion and SAQ, x 20 ankle pumps, hip adduction with ball squeeze, hip abduction with 5 sec hold manual resistance. Patient negotiated up/down 3 (3") stairs using 2 rails with min A overall, step-to pattern with no cues needed for sequencing. Patient limited during session by pain in RLE and LUE and fatigue. Patient left sitting in wheelchair with all needs within reach.   Therapy Documentation Precautions:  Precautions Precautions: Fall Precaution Comments: no specific weight bearing order but avoided weight bearing through L wrist; wound to right LE and groin Restrictions Weight Bearing Restrictions: Yes RLE Weight Bearing: Non weight bearing Other  Position/Activity Restrictions: R wrist Pain: Pain Assessment Pain Assessment: 0-10 Pain Score: 7  Pain Type: Acute pain Pain Location: Wrist Pain Orientation: Left Pain Descriptors / Indicators: Aching Pain Frequency: Intermittent Pain Onset: On-going Pain Intervention(s): Rest;Emotional support 2nd Pain Site Pain Score: 7 Pain Type: Acute pain Pain Location: Leg Pain Orientation: Right Pain Descriptors / Indicators: Aching Pain Onset: On-going Pain Intervention(s): Rest;Emotional support   See Function Navigator for Current Functional Status.   Therapy/Group: Individual Therapy  Kerney ElbeVarner, Lionell Matuszak A 05/12/2015, 3:29 PM

## 2015-05-12 NOTE — Progress Notes (Signed)
Subjective/Complaints: Appreciate hand surgery note. Wrist pain a little bit better has been trying to elevate it. Had MRI of the wrist yesterday evening, report is pending. I looked at the films but looks like there is quite a bit of movement artifact  No SOB or CP    Objective: Vital Signs: Blood pressure 109/66, pulse 96, temperature 98.8 F (37.1 C), temperature source Oral, resp. rate 18, height 6\' 3"  (1.905 m), weight 102.059 kg (225 lb), SpO2 98 %. Dg Wrist Complete Left  05/10/2015  CLINICAL DATA:  Left wrist pain and swelling and warmth, noted for 3 days. EXAM: LEFT WRIST - COMPLETE 3+ VIEW COMPARISON:  Left hand and wrist MRI, 04/07/2015. Left wrist radiographs, 04/07/2015. FINDINGS: Since prior radiographs, there has been a marked change. There is now marked loss of the joint spaces, specifically the radiocarpal joints and the midcarpal articulations. There is also joint space narrowing between the distal carpal row and the bases of the metacarpals. Bones are demineralized. There is a smudgy indistinctness of the trabecular pattern particularly notable in the distal radius. There is a bony fragment along the dorsal margin of the wrist on the lateral view is stable from the prior study. There is a small bone fragment along the radial margin of the ulnar styloid which was not evident previously. This could potentially reflect a small fracture at the insertion of the triangle fibrocartilage complex. No other evidence of a recent fracture. There is diffuse soft tissue swelling. There is some density in the soft tissues along the radial margin of the distal ulna, nonspecific. IMPRESSION: 1. Marked change from the prior wrist radiographs. There are now advanced arthropathic changes, developing since the prior study, which involve the radiocarpal joints and the intercarpal and carpometacarpal articulations, with marked loss of the joint spaces. The bony trabecula are less distinct. There is  some bone demineralization suggested of the distal radius and ulna and carpus. Diffuse surrounding soft tissue swelling is evident. Findings are consistent with an inflammatory arthropathy or possibly is diffuse septic arthritis. Electronically Signed   By: Amie Portlandavid  Ormond M.D.   On: 05/10/2015 18:18   Results for orders placed or performed during the hospital encounter of 05/10/15 (from the past 72 hour(s))  Sedimentation rate     Status: Abnormal   Collection Time: 05/10/15  6:00 PM  Result Value Ref Range   Sed Rate 111 (H) 0 - 16 mm/hr  Uric acid     Status: Abnormal   Collection Time: 05/10/15  6:00 PM  Result Value Ref Range   Uric Acid, Serum 2.3 (L) 4.4 - 7.6 mg/dL  CBC with Differential/Platelet     Status: Abnormal   Collection Time: 05/10/15  9:50 PM  Result Value Ref Range   WBC 8.0 4.0 - 10.5 K/uL   RBC 3.33 (L) 4.22 - 5.81 MIL/uL   Hemoglobin 9.0 (L) 13.0 - 17.0 g/dL   HCT 16.128.6 (L) 09.639.0 - 04.552.0 %   MCV 85.9 78.0 - 100.0 fL   MCH 27.0 26.0 - 34.0 pg   MCHC 31.5 30.0 - 36.0 g/dL   RDW 40.916.2 (H) 81.111.5 - 91.415.5 %   Platelets 365 150 - 400 K/uL   Neutrophils Relative % 42 %   Neutro Abs 3.3 1.7 - 7.7 K/uL   Lymphocytes Relative 44 %   Lymphs Abs 3.5 0.7 - 4.0 K/uL   Monocytes Relative 10 %   Monocytes Absolute 0.8 0.1 - 1.0 K/uL   Eosinophils Relative 3 %  Eosinophils Absolute 0.2 0.0 - 0.7 K/uL   Basophils Relative 1 %   Basophils Absolute 0.1 0.0 - 0.1 K/uL  Protime-INR     Status: Abnormal   Collection Time: 05/11/15  5:10 AM  Result Value Ref Range   Prothrombin Time 23.5 (H) 11.6 - 15.2 seconds   INR 2.11 (H) 0.00 - 1.49  Protime-INR     Status: Abnormal   Collection Time: 05/12/15  5:00 AM  Result Value Ref Range   Prothrombin Time 23.8 (H) 11.6 - 15.2 seconds   INR 2.15 (H) 0.00 - 1.49     HEENT: normal Cardio: RRR and no murmur Resp: CTA B/L and unlabored GI: BS positive and NT, ND Extremity:  Pulses positive and No Edema Skin:   Breakdown Right foot with  erythema and small areas of black eschar on great toe a.  Left wrist has mild erythema dorsal ulnar aspect Neuro: Alert/Oriented, Anxious, Abnormal Sensory reduced sensation to LT in left fingers and Right medial and ant thigh and right medial leg   Abnormal Motor 4- R hip add, 5/5 left Hip add  4/5 left  Knee ext , 2- RIght Knee ext , 3- Left hand grip, 3- Right ankle , 4- Left ankle , RUE normal Musc/Skel:  Other healed incision bilateral knee, reduced range of motion left wrist has dorsum of the hand edema as well as forearm edema. No significant joint swelling appreciated. Mild pain with gentle range of motion. More pain with supination pronation then with flexion and extension of the wrist. No pain with finger flexion or extension Gen NAD except complained of fatigue with minmal activity   Assessment/Plan: 1. 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 Physiatrist is providing close team supervision and 24 hour management of active medical problems listed below. Physiatrist and rehab team continue to assess barriers to discharge/monitor patient progress toward functional and medical goals.  FIM: Function - Bathing Position: Wheelchair/chair at sink Body parts bathed by patient: Right arm, Left arm, Chest, Abdomen, Front perineal area, Buttocks, Right upper leg, Left upper leg, Left lower leg Body parts bathed by helper: Right lower leg, Back Assist Level: Touching or steadying assistance(Pt > 75%)  Function- Upper Body Dressing/Undressing What is the patient wearing?: Pull over shirt/dress Pull over shirt/dress - Perfomed by patient: Thread/unthread right sleeve, Thread/unthread left sleeve, Put head through opening, Pull shirt over  trunk Assist Level: Supervision or verbal cues, Set up Set up : To obtain clothing/put away Function - Lower Body Dressing/Undressing What is the patient wearing?: Pants, Non-skid slipper socks Position: Wheelchair/chair at sink Pants- Performed by patient: Thread/unthread left pants leg, Pull pants up/down Pants- Performed by helper: Thread/unthread right pants leg Non-skid slipper socks- Performed by patient: Don/doff left sock Non-skid slipper socks- Performed by helper: Don/doff right sock  Function - Toileting Toileting steps completed by patient: Adjust clothing prior to toileting, Performs perineal hygiene, Adjust clothing after toileting Assist level: Touching or steadying assistance (Pt.75%) Medical Problem List and Plan: 1. 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  DVT Prophylaxis/Anticoagulation: Coumadin for thromboembolism. Monitor for any bleeding episodes 3. Pain Management: MS Contin 90 mg twice a day, oxycodone for breakthrough pain. Monitor with increased  mobility, at this point not much nerve pain in RLE, pain seems well controlled, no excess sedation 4. Mood: Effexor XR 75 mg daily, Klonopin 0.5 mg 4 times a day as needed 5. Neuropsych: This patient is capable of making decisions on his own behalf. 6. Skin/Wound Care: Skin care is supervised to right foot. 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, still in process of demarcation 7. Fluids/Electrolytes/Nutrition: Strict I and O's with follow-up chemistries 8.ID. Presently on Ancef 2 g 3 times a day. ID rec total 6 wks, UCx showed no growth so no changes needed 9. Tachycardia SVT. Follow-up cardiology services. Continue Lopressor 50 mg twice a day for now, HR ok at 91 this am 10. Constipation. Laxitive   assistance, per pt doing well, large BM this morning, continent 11.  Severe protein malnutrition, alb 1.6- supplement with beneprotein, enc hi protein diet choices Urinary retention-voiding well 12. Left wrist pain. History of distal radial fracture. X-rays indicate collapse of joint space over the last 6 weeks or so. In discussion with hand surgery does not appear to be an acutely septic joint. Await MRI report We'll continue wrist splint. No prednisone recommended as this does not appear to be gout Function - Archivist transfer assistive device: Bedside commode Assist level to bedside commode (at bedside): Touching or steadying assistance (Pt > 75%) Assist level from bedside commode (at bedside): Touching or steadying assistance (Pt > 75%)  Function - Chair/bed transfer Chair/bed transfer method: Stand pivot Chair/bed transfer assist level: Touching or steadying assistance (Pt > 75%) Chair/bed transfer assistive device: Walker, Armrests Chair/bed transfer details: Verbal cues for technique, Manual facilitation for placement, Manual facilitation for weight shifting, Verbal cues for safe use of DME/AE, Verbal cues for precautions/safety  Function - Locomotion: Wheelchair Will patient use wheelchair at discharge?: Yes Type: Manual Max wheelchair distance: 100' Assist Level: Supervision or verbal cues Assist Level: Supervision or verbal cues Wheel 150 feet activity did not occur: Safety/medical concerns Turns around,maneuvers to table,bed, and toilet,negotiates 3% grade,maneuvers on rugs and over doorsills: No Function - Locomotion: Ambulation Assistive device: Walker-platform Max distance: 15' Assist level: Touching or steadying assistance (Pt > 75%) Assist level: Touching or steadying assistance (Pt > 75%) Walk 50 feet with 2 turns activity did not occur: Safety/medical concerns Walk 150 feet activity did not occur: Safety/medical concerns Walk 10 feet on uneven surfaces  activity did not occur: Safety/medical concerns  Function - Comprehension Comprehension: Auditory Comprehension assist level: Follows complex conversation/direction with extra time/assistive device  Function - Expression Expression: Verbal Expression assist level: Expresses complex ideas: With extra time/assistive device  Function - Social Interaction Social Interaction assist level: Interacts appropriately with others with medication or extra time (anti-anxiety, antidepressant).  Function - Problem Solving Problem solving assist level: Solves basic problems with no assist  Function - Memory Memory assist level: Recognizes or recalls 90% of the time/requires cueing < 10% of the time Patient normally able to recall (first 3 days only): Current season, Location of own room, Staff names and faces, That he or she is in a hospital    LOS (Days) 2 A FACE TO FACE EVALUATION WAS PERFORMED  KIRSTEINS,ANDREW E 05/12/2015, 11:08 AM

## 2015-05-12 NOTE — IPOC Note (Addendum)
Overall Plan of Care (IPOC) Patient Details Name: [name] MRN: [id number] DOB: Sep 15, 1955  Admitting Diagnosis: sepsis  Hospital Problems: Principal Problem:   Debility Active Problems:   Chronic pain syndrome   Cellulitis of left hand   Acute blood loss anemia   Nondisplaced fracture of distal end of left radius   Sepsis (HCC)   Femoral neuropathy of right lower extremity   Adjustment disorder with depressed mood     Functional Problem List: Nursing Bowel, Medication Management, Pain, Skin Integrity, Edema, Endurance, Motor  PT Balance, Edema, Endurance, Motor, Pain, Safety, Sensory, Skin Integrity  OT Balance, Edema, Endurance, Motor, Safety, Sensory, Pain, Skin Integrity  SLP    TR         Basic ADL's: OT Bathing, Dressing, Toileting     Advanced  ADL's: OT       Transfers: PT Bed Mobility, Bed to Chair, Car, Oncologist: PT Ambulation, Psychologist, prison and probation services, Stairs     Additional Impairments: OT Fuctional Use of Upper Extremity  SLP        TR      Anticipated Outcomes Item Anticipated Outcome  Self Feeding Mod I  Swallowing      Basic self-care  Supervision  Toileting  Supervision- mod I   Bathroom Transfers Mod I toilet; Supervision shower  Bowel/Bladder  Continent of bowel and bladder LBM 10/13  Transfers  Mod I  Locomotion  Supervision gait and w/c mobility; min A stairs  Communication     Cognition     Pain  Pain goal 4/10  Safety/Judgment  Supervision   Therapy Plan: PT Intensity: Minimum of 1-2 x/day ,45 to 90 minutes PT Frequency: 5 out of 7 days PT Duration Estimated Length of Stay: 14-17 days OT Intensity: Minimum of 1-2 x/day, 45 to 90 minutes OT Frequency: 5 out of 7 days OT Duration/Estimated Length of Stay: 14-17 days         Team Interventions: Nursing Interventions Patient/Family Education, Bowel Management, Disease Management/Prevention, Pain Management, Medication Management, Skin  Care/Wound Management, Psychosocial Support  PT interventions Ambulation/gait training, DME/adaptive equipment instruction, Psychosocial support, UE/LE Strength taining/ROM, Balance/vestibular training, Skin care/wound management, UE/LE Coordination activities, Functional electrical stimulation, Cognitive remediation/compensation, Functional mobility training, Splinting/orthotics, Community reintegration, Neuromuscular re-education, Museum/gallery curator, Wheelchair propulsion/positioning, Discharge planning, Pain management, Therapeutic Activities, Disease management/prevention, Equities trader education, Therapeutic Exercise  OT Interventions Warden/ranger, Cognitive remediation/compensation, Discharge planning, Community reintegration, DME/adaptive equipment instruction, Functional mobility training, Pain management, Psychosocial support, Skin care/wound managment, Self Care/advanced ADL retraining, Patient/family education, Therapeutic Activities, UE/LE Strength taining/ROM, UE/LE Coordination activities, Therapeutic Exercise, Wheelchair propulsion/positioning, Disease mangement/prevention  SLP Interventions    TR Interventions    SW/CM Interventions Discharge Planning, Psychosocial Support, Patient/Family Education    Team Discharge Planning: Destination: PT-Home ,OT- Home , SLP-  Projected Follow-up: PT-24 hour supervision/assistance, Home health PT, OT-  Outpatient OT, SLP-  Projected Equipment Needs: PT-To be determined, OT- To be determined, 3 in 1 bedside comode, SLP-  Equipment Details: PT-Pt already owns RW and SPC, OT-  Patient/family involved in discharge planning: PT- Patient,  OT-Patient, SLP-   MD ELOS: 10-14d Medical Rehab Prognosis:  Good Assessment: 59 year old male who had an injection related abscess of the right thigh leading to right femoral neuropathy. He also had a fall leading to distal wrist fracture. He's had evidence of septic arthritis likely from hematogenous  spread to the left wrist. Further evaluation with MRI is pending.He was transferred to acute  care from rehabilitation for supraventricular tachycardia. This condition was stabilized and the patient is back to resume therapy at an intensive level  Now requiring 24/7 Rehab RN,MD, as well as CIR level PT, OT and SLP.  Treatment team will focus on ADLs and mobility with goals set at Sup   See Team Conference Notes for weekly updates to the plan of care

## 2015-05-12 NOTE — Progress Notes (Signed)
Following therapy today, pt noted scant serous drainage in abdominal incision site, at scab. Pt mentions some pain at site. Applied gauze dressing. Will report to oncoming RN

## 2015-05-13 ENCOUNTER — Inpatient Hospital Stay (HOSPITAL_COMMUNITY): Payer: Medicaid Other

## 2015-05-13 ENCOUNTER — Encounter (HOSPITAL_COMMUNITY): Payer: 59

## 2015-05-13 ENCOUNTER — Inpatient Hospital Stay (HOSPITAL_COMMUNITY): Payer: Medicaid Other | Admitting: Physical Therapy

## 2015-05-13 ENCOUNTER — Inpatient Hospital Stay (HOSPITAL_COMMUNITY): Payer: 59

## 2015-05-13 DIAGNOSIS — F4321 Adjustment disorder with depressed mood: Secondary | ICD-10-CM

## 2015-05-13 LAB — CULTURE, BLOOD (ROUTINE X 2)
CULTURE: NO GROWTH
Culture: NO GROWTH

## 2015-05-13 LAB — CBC WITH DIFFERENTIAL/PLATELET
Basophils Absolute: 0.1 10*3/uL (ref 0.0–0.1)
Basophils Relative: 1 %
Eosinophils Absolute: 0.3 10*3/uL (ref 0.0–0.7)
Eosinophils Relative: 4 %
HEMATOCRIT: 27.3 % — AB (ref 39.0–52.0)
HEMOGLOBIN: 8.1 g/dL — AB (ref 13.0–17.0)
LYMPHS PCT: 47 %
Lymphs Abs: 3.7 10*3/uL (ref 0.7–4.0)
MCH: 25.6 pg — ABNORMAL LOW (ref 26.0–34.0)
MCHC: 29.7 g/dL — AB (ref 30.0–36.0)
MCV: 86.1 fL (ref 78.0–100.0)
MONO ABS: 0.6 10*3/uL (ref 0.1–1.0)
MONOS PCT: 8 %
NEUTROS ABS: 3.2 10*3/uL (ref 1.7–7.7)
Neutrophils Relative %: 40 %
Platelets: 329 10*3/uL (ref 150–400)
RBC: 3.17 MIL/uL — ABNORMAL LOW (ref 4.22–5.81)
RDW: 16.2 % — AB (ref 11.5–15.5)
WBC: 7.9 10*3/uL (ref 4.0–10.5)

## 2015-05-13 LAB — COMPREHENSIVE METABOLIC PANEL
ALK PHOS: 89 U/L (ref 38–126)
ALT: 9 U/L — ABNORMAL LOW (ref 17–63)
AST: 21 U/L (ref 15–41)
Albumin: 2.3 g/dL — ABNORMAL LOW (ref 3.5–5.0)
Anion gap: 10 (ref 5–15)
BILIRUBIN TOTAL: 0.4 mg/dL (ref 0.3–1.2)
BUN: 18 mg/dL (ref 6–20)
CO2: 29 mmol/L (ref 22–32)
Calcium: 8.7 mg/dL — ABNORMAL LOW (ref 8.9–10.3)
Chloride: 99 mmol/L — ABNORMAL LOW (ref 101–111)
Creatinine, Ser: 0.95 mg/dL (ref 0.61–1.24)
GFR calc Af Amer: >60 mL/min (ref >60)
Glucose, Bld: 105 mg/dL — ABNORMAL HIGH (ref 65–99)
POTASSIUM: 4.5 mmol/L (ref 3.5–5.1)
Sodium: 138 mmol/L (ref 135–145)
TOTAL PROTEIN: 7 g/dL (ref 6.5–8.1)

## 2015-05-13 LAB — PROTIME-INR
INR: 1.97 — ABNORMAL HIGH (ref 0.00–1.49)
PROTHROMBIN TIME: 22.3 s — AB (ref 11.6–15.2)

## 2015-05-13 MED ORDER — WARFARIN SODIUM 7.5 MG PO TABS
7.5000 mg | ORAL_TABLET | Freq: Once | ORAL | Status: AC
  Administered 2015-05-13: 7.5 mg via ORAL
  Filled 2015-05-13: qty 1

## 2015-05-13 NOTE — Progress Notes (Signed)
Occupational Therapy Session Note  Patient Details  Name: Craig MunchJimmy F Ardila MRN: [id number] Date of Birth: [birth year]  Today's Date: 05/13/2015 OT Individual Time  [id number] 60 Min  Short Term Goals: Week 1:  OT Short Term Goal 1 (Week 1): Pt will complete stand pivot [facility] with supervision OT Short Term Goal 2 (Week 1): Pt will dress don underwear/ pants with steadying assist.  OT Short Term Goal 3 (Week 1): Pt will complete all grooming tasks from seated level mod I OT Short Term Goal 4 (Week 1): Pt will demonstrate carryover of education of self-care skills with min questioning cues  Skilled Therapeutic Interventions/Progress Updates: ADL-retraining at shower level with focus on improved transfers, toileting, adapted bathing/dressing skils, and DME use.   Pt received supine in bed, HOB elevated and finishing his breakfast.  With setup assist to provide w/c and adjust bed rail, pt rises to sit at edge of bed using bed rails and completes lateral scoot transfer from edge of bed to w/c with steadying assist to stabilize w/c.  Pt is escorted to bathroom and performs stand-pivot transfer from w/c to bench with min vc and steadying assist.  After setup to provide water barriers to wounds, pt bathes unassisted although requiring supervision to problem-solve and to prompt to use LUE during task.   Pt dries at bench and returns to w/c for safe transfer from w/c to toilet.   Pt completes BM to include hygiene and dresses on toilet with assist to lace pants and don socks d/t time limit near end of session.  Pt is placed near bedside with call light placed within reach.     Therapy Documentation Precautions:  Precautions Precautions: Fall Precaution Comments: no specific weight bearing order but avoided weight bearing through L wrist; wound to right LE and groin Restrictions Weight Bearing Restrictions: Yes RLE Weight Bearing: Non weight bearing Other Position/Activity Restrictions: R wrist    Pain: Pain Assessment Pain Score: 4   See Function Navigator for Current Functional Status.   Therapy/Group: Individual Therapy  Gerhardt Gleed 05/13/2015, 12:52 PM

## 2015-05-13 NOTE — Progress Notes (Signed)
Physical Therapy Session Note  Patient Details  Name: [name] MRN: [id number] Date of Birth: Jun 07, 1956  Today's Date: 05/13/2015 PT Individual Time: 1105-1205 PT Individual Time Calculation (min): 60 min   Short Term Goals: Week 1:  PT Short Term Goal 1 (Week 1): Pt will initiate ambulation during PT session.  PT Short Term Goal 2 (Week 1): Pt will tolerate sitting up in w/c oob x 4 hours.  PT Short Term Goal 3 (Week 1): Pt will initiate stand-pivot bed to/from w/c transfers.  PT Short Term Goal 4 (Week 1): Pt will initiate stair training during PT session.  PT Short Term Goal 5 (Week 1): Pt will demonstrate weight bearing through R heel during stand-pivot transfers in order to increase functional use of the extremity.   Skilled Therapeutic Interventions/Progress Updates:    Patient received in bed and performed supine to sit with rail and HOB elevated with supervision.  Patient's walker adjusted to move platform so screws outside walker.  Patient sit to stand min assist and ambulated x 35' min assist.  In wheelchair propelled x 130' supervision with right UE and left LE.  Patient transferred to mat min assist with walker SPT.  Walker further adjusted with tennis balls on back legs and new leg rest applied to wheelchair for better fit.  Patient performed sit to stand from elevated mat with out UE support x 2 prior to fatigue.  Then standing weight shifts for increased right weight shift with focus on attaining foot flat.  Seated ankle pumps x 10.  Patient ambulated with PFRW x 45' with min assist improved foot flat and reported improved comfort with walker.  In wheelchair pushed by staff back to room due to fatigued at end of session.  Left in chair with call bell in reach.   Therapy Documentation Precautions:  Precautions Precautions: Fall Precaution Comments: no specific weight bearing order but avoided weight bearing through L wrist; wound to right LE and groin Restrictions Weight  Bearing Restrictions: Yes RLE Weight Bearing: Non weight bearing Other Position/Activity Restrictions: R wrist Pain: Pain Assessment Pain Score: 4  Faces Pain Scale: Hurts even more Pain Location: Foot Pain Orientation: Right Pain Descriptors / Indicators: Aching Pain Onset: On-going Pain Intervention(s): Repositioned;Rest   See Function Navigator for Current Functional Status.   Therapy/Group: Individual Therapy  Rudi CocoWYNN,CYNDI  Cyndi DefianceWynn, South CarolinaPT [phone] 05/13/2015  05/13/2015, 1:42 PM

## 2015-05-13 NOTE — Consult Note (Addendum)
WOC wound follow-up consult note Reason for Consult: Plastics following for right thigh.  Hand surgeon following for left arm. Requested to re-assess right foot.  Refer to previous consult on 10/13.   Wound type: Full thickness wounds to right toes and plantar foot, basically unchanged from previous assessment.  Right heel with tightly adhered dry yellow skin with patchy areas of black; no open wounds.  Eschar beginning to loosen slightly to first 3 toes.  4th and fifth toes with intact skin at this time.  Right great toe is very red and swollen and has small amt yellow drainage, no odor.  Appearance has declined since previous consult.  Pt could benefit from MRI to assess for gout versus infection.  Discussed plan of care with PA this AM; please order if desired. Continue present plan of care with [facility] for chemical debridement to eschar areas.  Dr Lajoyce Cornersuda plans to follow as an outpatient when discharged. Requested to assess abd wound; few patchy areas of dry scabs where previous suture line is located.  Currently no odor, drainage, or open wounds.  Pt states it had small amt yellow drainage earlier.  Leave open to air unless drainage occurs, then apply foam dressing to protect and absorb drainage if this occurs.  Please re-consult if further assistance is needed.  Thank-you,  Cammie Mcgeeawn Cheralyn Oliver MSN, RN, CWOCN, [place]CN-AP, CNS [phone]

## 2015-05-13 NOTE — Progress Notes (Signed)
ANTICOAGULATION CONSULT NOTE - Follow Up Consult  Pharmacy Consult for Coumadin Indication: DVT    Labs:  Recent Labs  05/10/15 2150 05/11/15 0510 05/12/15 0500 05/13/15 0516  HGB 9.0*  --   --  8.1*  HCT 28.6*  --   --  27.3*  PLT 365  --   --  329  LABPROT  --  23.5* 23.8* 22.3*  INR  --  2.11* 2.15* 1.97*  CREATININE  --   --   --  0.95     Assessment: 59 y/o M with prolonged hospital stay for MSSA bacteremia and septic thrombophlebitis. He has been on coumadin for anticoagulation since 9/22. INR 1.97 today. Hgb 8.1, plt 329K, stable. No bleeding noted per chart.   Goal of Therapy:  INR 2-3 Monitor platelets by anticoagulation protocol: Yes   Plan:  - Coumadin 7.5 mg po x 1 - Daily INR while inpatient  Bayard HuggerMei Layza Summa, PharmD, BCPS  Clinical Pharmacist  Pager: [phone]

## 2015-05-13 NOTE — Progress Notes (Signed)
MRI review:  Poor MRI, bony and soft tissue edema - diffuse; no definite single area of necrosis. Plan:  Would continue wrist immobilization in current splint, pt should move fingers to prevent stiffness, may perform general wrist motion as discomfort dictates.  Would treat with IV antibiotics for ~6wks.  If hand wound / sinus develops may need further I&D.  An anti-inflammatory may also help, but would probably stay away from prednisone.  Pt should f/u up with me as outpatient in 3-4 wks, prn.

## 2015-05-13 NOTE — Progress Notes (Signed)
Physical Therapy Session Note  Patient Details  Name: [name] MRN: [id number] Date of Birth: 04-Jan-1956  Today's Date: 05/13/2015 PT Individual Time: 0349-1791 PT Individual Time Calculation (min): 89 min   Short Term Goals: Week 1:  PT Short Term Goal 1 (Week 1): Pt will initiate ambulation during PT session.  PT Short Term Goal 2 (Week 1): Pt will tolerate sitting up in w/c oob x 4 hours.  PT Short Term Goal 3 (Week 1): Pt will initiate stand-pivot bed to/from w/c transfers.  PT Short Term Goal 4 (Week 1): Pt will initiate stair training during PT session.  PT Short Term Goal 5 (Week 1): Pt will demonstrate weight bearing through R heel during stand-pivot transfers in order to increase functional use of the extremity.   Skilled Therapeutic Interventions/Progress Updates:    Pt received supine in bed and agreeable to therapy.  Pt transitioned supine>sitting EOB with set up only and sit>stand with platform RW with steady assist.  PT instructed patient in ambulation x60' with platform RW with steady assist and verbal cues for R knee extension, R heel strike and foot flat, upright posture and increased step length on L.  Pt propelled gym remaining 130' to gym in w/c.  PT instructed patient in R calf stretch 10x15 seconds and self R calf stretch in sitting with focus on passive knee ROM (scooting towards edge of w/c) and focus on reaching R heel to floor.  PT instructed patient in 2x5 minutes on Kinetron at 20 cm/s from w/c.  PT instructed patient in 4" step ups in // bars 2 steps R and L foot with increased reliance on UEs noted in R stance.  Pt requesting seated rest break following 2 steps on each foot stating he is fatigued from kinetron.  Pt agreeable to continue with therapy focusing on community level w/c mobility.  Pt able to propel w/c 130' with supervision and requires min assist to propel in a straight path on uneven surfaces and up/down an incline.  PT provided patient education on L  hand/wrist mobility encouraging patient to keep L hand elevated and to work on making a fist for muscle pump action to reduce swelling. Pt returned to room and transferred w/c>bed with squat pivot and supervision. Pt positioned in supine with call bell in reach and needs met.   Therapy Documentation Precautions:  Precautions Precautions: Fall Precaution Comments: no specific weight bearing order but avoided weight bearing through L wrist; wound to right LE and groin Restrictions Weight Bearing Restrictions: Yes RLE Weight Bearing: Non weight bearing Other Position/Activity Restrictions: R wrist Pain: Pain Assessment Pain Assessment: 0-10 Pain Score: 8  Faces Pain Scale: Hurts even more Pain Type: Acute pain Pain Location: Knee Pain Orientation: Right Pain Descriptors / Indicators: Aching Pain Frequency: Intermittent Pain Onset: On-going Patients Stated Pain Goal: 3 Pain Intervention(s): Elevated extremity;Emotional support;RN made aware;Repositioned;Rest Multiple Pain Sites: No   See Function Navigator for Current Functional Status.   Therapy/Group: Individual Therapy  Earnest Conroy Penven-Crew 05/13/2015, 4:23 PM

## 2015-05-13 NOTE — Progress Notes (Signed)
Recreational Therapy Session Note  Patient Details  Name: [name] MRN: [id number] Date of Birth: Dec 11, 1955 Today's Date: 05/13/2015  Pt participated in animal assisted activity/therapy at bed level with supervision.  Bernice Mullin 05/13/2015, 3:28 PM

## 2015-05-13 NOTE — Progress Notes (Signed)
Subjective/Complaints: Appreciate hand surgery note. Wrist pain a little bit better ,, feels like he is moving his fingers better. Had MRI of the wrist , Official report, suboptimal study, movement, synovitis, osteomyelitis versus septic arthritis. No nerve compression in the carpal tunnel  Review of systems:No SOB or CP, good BM this morning.    Objective: Vital Signs: Blood pressure 105/59, pulse 69, temperature 97.9 F (36.6 C), temperature source Oral, resp. rate 17, height 6\' 3"  (1.905 m), weight 102.059 kg (225 lb), SpO2 99 %. Mr Wrist Left Wo Contrast  05/12/2015  CLINICAL DATA:  Status post fall 04/03/2015 with a distal left radius fracture. Worsening right wrist pain over the past few days with continued swelling. Question septic joint. History of thigh abscess and bacteremia. Subsequent encounter. EXAM: MR OF THE LEFT WRIST WITHOUT CONTRAST TECHNIQUE: Multiplanar, multisequence MR imaging of the left wrist was performed. No intravenous contrast was administered. COMPARISON:  Plain films of the left wrist 05/09/2017, 04/07/2015 and 10/18/2014. MRI of the left hand 04/07/2015. FINDINGS: This examination is markedly degraded by patient motion. Ligaments: Scapholunate ligament tear seen on the comparison MRI is again seen with widening of the scapholunate interval. The lunotriquetral ligament is not visualized on this motion degraded exam. Triangular fibrocartilage: Limited visualization due to motion. There may be a tear through the disc of the triangular fibrocartilage. Tendons: There is a small amount of fluid about all of the extensor tendons. The extensor carpi ulnaris is posteriorly subluxed at the distal ulna. No tendon tear is identified. Carpal tunnel/median nerve: Unremarkable. Guyon's canal: Unremarkable. Joint/cartilage: Limited visualization. There is marked joint space loss throughout the carpus. Bones/carpal alignment: There is edema in the distal radius, ulna, all of the  carpal bones and bases of the metacarpals. The patient's distal radius fracture is not well demonstrated on this examination. Other: Extensive soft tissue swelling and marked synovial thickening are seen throughout the carpus. Imaged intrinsic musculature is also edematous. No focal fluid collection is seen on this uninfused examination. IMPRESSION: The exam is degraded by patient motion. 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 No discrete abscess is identified on this examination. Small volume of fluid about the extensor tendons compatible with tenosynovitis, possibly infectious. Carpal subluxation of the extensor carpi ulnaris at the distal ulna is consistent with tear of the ECU subsheath. Known distal radius fracture is not well seen on this examination. Likely tear of the disc of the triangular fibrocartilage. Scapholunate ligament tear is again seen as on the prior CT. Electronically Signed   By: 06/07/2015 M.D.   On: 05/12/2015 12:22   Results for orders placed or performed during the hospital encounter of 05/10/15 (from the past 72 hour(s))  Sedimentation rate     Status: Abnormal   Collection Time: 05/10/15  6:00 PM  Result Value Ref Range   Sed Rate 111 (H) 0 - 16 mm/hr  Uric acid     Status: Abnormal   Collection Time: 05/10/15  6:00 PM  Result Value Ref Range   Uric Acid, Serum 2.3 (L) 4.4 - 7.6 mg/dL  CBC with Differential/Platelet     Status: Abnormal   Collection Time: 05/10/15  9:50 PM  Result Value Ref Range   WBC 8.0 4.0 - 10.5 K/uL   RBC 3.33 (L) 4.22 - 5.81 MIL/uL   Hemoglobin 9.0 (L) 13.0 - 17.0 g/dL   HCT 05/12/15 (L) 95.1 -  52.0 %   MCV 85.9 78.0 - 100.0 fL   MCH 27.0 26.0 - 34.0 pg   MCHC 31.5 30.0 - 36.0 g/dL   RDW 16.2 (H) 11.5 - 15.5 %   Platelets 365 150 - 400 K/uL   Neutrophils Relative % 42 %   Neutro Abs 3.3 1.7 - 7.7 K/uL   Lymphocytes Relative 44 %    Lymphs Abs 3.5 0.7 - 4.0 K/uL   Monocytes Relative 10 %   Monocytes Absolute 0.8 0.1 - 1.0 K/uL   Eosinophils Relative 3 %   Eosinophils Absolute 0.2 0.0 - 0.7 K/uL   Basophils Relative 1 %   Basophils Absolute 0.1 0.0 - 0.1 K/uL  Protime-INR     Status: Abnormal   Collection Time: 05/11/15  5:10 AM  Result Value Ref Range   Prothrombin Time 23.5 (H) 11.6 - 15.2 seconds   INR 2.11 (H) 0.00 - 1.49  Protime-INR     Status: Abnormal   Collection Time: 05/12/15  5:00 AM  Result Value Ref Range   Prothrombin Time 23.8 (H) 11.6 - 15.2 seconds   INR 2.15 (H) 0.00 - 1.49  CBC WITH DIFFERENTIAL     Status: Abnormal   Collection Time: 05/13/15  5:16 AM  Result Value Ref Range   WBC 7.9 4.0 - 10.5 K/uL   RBC 3.17 (L) 4.22 - 5.81 MIL/uL   Hemoglobin 8.1 (L) 13.0 - 17.0 g/dL   HCT 27.3 (L) 39.0 - 52.0 %   MCV 86.1 78.0 - 100.0 fL   MCH 25.6 (L) 26.0 - 34.0 pg   MCHC 29.7 (L) 30.0 - 36.0 g/dL   RDW 16.2 (H) 11.5 - 15.5 %   Platelets 329 150 - 400 K/uL   Neutrophils Relative % 40 %   Neutro Abs 3.2 1.7 - 7.7 K/uL   Lymphocytes Relative 47 %   Lymphs Abs 3.7 0.7 - 4.0 K/uL   Monocytes Relative 8 %   Monocytes Absolute 0.6 0.1 - 1.0 K/uL   Eosinophils Relative 4 %   Eosinophils Absolute 0.3 0.0 - 0.7 K/uL   Basophils Relative 1 %   Basophils Absolute 0.1 0.0 - 0.1 K/uL  Comprehensive metabolic panel     Status: Abnormal   Collection Time: 05/13/15  5:16 AM  Result Value Ref Range   Sodium 138 135 - 145 mmol/L   Potassium 4.5 3.5 - 5.1 mmol/L   Chloride 99 (L) 101 - 111 mmol/L   CO2 29 22 - 32 mmol/L   Glucose, Bld 105 (H) 65 - 99 mg/dL   BUN 18 6 - 20 mg/dL   Creatinine, Ser 0.95 0.61 - 1.24 mg/dL   Calcium 8.7 (L) 8.9 - 10.3 mg/dL   Total Protein 7.0 6.5 - 8.1 g/dL   Albumin 2.3 (L) 3.5 - 5.0 g/dL   AST 21 15 - 41 U/L   ALT 9 (L) 17 - 63 U/L   Alkaline Phosphatase 89 38 - 126 U/L   Total Bilirubin 0.4 0.3 - 1.2 mg/dL   GFR calc non Af Amer >60 >60 mL/min   GFR calc Af Amer  >60 >60 mL/min    Comment: (NOTE) The eGFR has been calculated using the CKD EPI equation. This calculation has not been validated in all clinical situations. eGFR's persistently <60 mL/min signify possible Chronic Kidney Disease.    Anion gap 10 5 - 15  Protime-INR     Status: Abnormal   Collection Time: 05/13/15  5:16 AM  Result Value Ref Range   Prothrombin Time 22.3 (H) 11.6 - 15.2 seconds   INR 1.97 (H) 0.00 - 1.49     HEENT: normal Cardio: RRR and no murmur Resp: CTA B/L and unlabored GI: BS positive and NT, ND Extremity:  Pulses positive and No Edema Skin:   Breakdown Right foot with erythema and small areas of black eschar on great toe a.  Left wrist has mild erythema dorsal ulnar aspect Neuro: Alert/Oriented, Anxious, Abnormal Sensory reduced sensation to LT in left fingers and Right medial and ant thigh and right medial leg   Abnormal Motor 4- R hip add, 5/5 left Hip add  4/5 left  Knee ext , 2- RIght Knee ext , 3- Left hand grip, 3- Right ankle , 4- Left ankle , RUE normal Musc/Skel:  Other healed incision bilateral knee, reduced range of motion left wrist has dorsum of the hand edema as well as forearm edema. No significant joint swelling appreciated. Mild pain with gentle range of motion. More pain with supination pronation then with flexion and extension of the wrist. No pain with finger flexion or extension Gen NAD except complained of fatigue with minmal activity   Assessment/Plan: 1. 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 Physiatrist is providing close team supervision and 24 hour management of active medical problems listed below. Physiatrist and rehab team continue to assess barriers to discharge/monitor  patient progress toward functional and medical goals.  FIM: Function - Bathing Position: Wheelchair/chair at sink Body parts bathed by patient: Right arm, Left arm, Chest, Abdomen, Front perineal area, Buttocks, Right upper leg, Left upper leg, Left lower leg Body parts bathed by helper: Right lower leg, Back Assist Level: Touching or steadying assistance(Pt > 75%)  Function- Upper Body Dressing/Undressing What is the patient wearing?: Pull over shirt/dress Pull over shirt/dress - Perfomed by patient: Thread/unthread right sleeve, Thread/unthread left sleeve, Put head through opening, Pull shirt over trunk Assist Level: Supervision or verbal cues, Set up Set up : To obtain clothing/put away Function - Lower Body Dressing/Undressing What is the patient wearing?: Pants, Non-skid slipper socks Position: Wheelchair/chair at sink Pants- Performed by patient: Thread/unthread left pants leg, Pull pants up/down Pants- Performed by helper: Thread/unthread right pants leg Non-skid slipper socks- Performed by patient: Don/doff left sock Non-skid slipper socks- Performed by helper: Don/doff right sock  Function - Toileting Toileting steps completed by patient: Adjust clothing prior to toileting, Performs perineal hygiene, Adjust clothing after toileting Assist level: Touching or steadying assistance (Pt.75%) Medical Problem List and Plan: 1. 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  DVT Prophylaxis/Anticoagulation: Coumadin for thromboembolism. Monitor for any bleeding episodes 3. Pain Management: MS Contin 90 mg twice a day, oxycodone for breakthrough pain. Monitor with increased mobility, at this point not much nerve pain in RLE, pain seems well controlled, no excess sedation 4. Mood: Effexor XR 75 mg daily, Klonopin 0.5  mg 4 times a day as needed 5. Neuropsych: This patient is capable of making decisions on his own behalf. 6. Skin/Wound Care: Skin care is supervised to right foot. 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 Sharol Given, still in process of demarcation 7. Fluids/Electrolytes/Nutrition: Strict I  and O's with follow-up chemistries 8.ID. Presently on Ancef 2 g 3 times a day. ID rec total 6 wks, UCx showed no growth so no changes needed 9. Tachycardia SVT. Follow-up cardiology services. Continue Lopressor 50 mg twice a day for now, HR ok at 91 this am 10. Constipation. Laxitive  assistance, per pt doing well, large BM this morning, continent 11.  Severe protein malnutrition, alb 1.6- supplement with beneprotein, enc hi protein diet choices Urinary retention-voiding well 12. Left wrist pain. History of distal radial fracture. X-rays indicate collapse of joint space over the last 6 weeks or so. In discussion with hand surgery does not appear to be an acutely septic joint. Await hand surgery review of MRI. Will need at least 6 weeks of antibiotics, question broaden coverage. Infectious disease may need to weigh in  Function - Toilet Transfers Toilet transfer assistive device: Bedside commode Assist level to bedside commode (at bedside): Touching or steadying assistance (Pt > 75%) Assist level from bedside commode (at bedside): Touching or steadying assistance (Pt > 75%)  Function - Chair/bed transfer Chair/bed transfer method: Squat pivot Chair/bed transfer assist level: Supervision or verbal cues Chair/bed transfer assistive device: Walker, Armrests Chair/bed transfer details: Verbal cues for technique, Manual facilitation for placement, Manual facilitation for weight shifting, Verbal cues for safe use of DME/AE, Verbal cues for precautions/safety  Function - Locomotion: Wheelchair Will patient use wheelchair at discharge?: Yes Type: Motorized Max wheelchair distance: 130  ft Assist Level: Supervision or verbal cues Assist Level: Supervision or verbal cues Wheel 150 feet activity did not occur: Safety/medical concerns Turns around,maneuvers to table,bed, and toilet,negotiates 3% grade,maneuvers on rugs and over doorsills: No Function - Locomotion: Ambulation Assistive device: Walker-platform Max distance: 27 ft Assist level: Touching or steadying assistance (Pt > 75%) Assist level: Touching or steadying assistance (Pt > 75%) Walk 50 feet with 2 turns activity did not occur: Safety/medical concerns Walk 150 feet activity did not occur: Safety/medical concerns Walk 10 feet on uneven surfaces activity did not occur: Safety/medical concerns  Function - Comprehension Comprehension: Auditory Comprehension assist level: Follows complex conversation/direction with extra time/assistive device  Function - Expression Expression: Verbal Expression assist level: Expresses complex ideas: With extra time/assistive device  Function - Social Interaction Social Interaction assist level: Interacts appropriately with others with medication or extra time (anti-anxiety, antidepressant).  Function - Problem Solving Problem solving assist level: Solves basic problems with no assist  Function - Memory Memory assist level: Recognizes or recalls 90% of the time/requires cueing < 10% of the time Patient normally able to recall (first 3 days only): Current season, Location of own room, Staff names and faces, That he or she is in a hospital    LOS (Days) 3 A FACE TO [place] E 05/13/2015, 9:54 AM

## 2015-05-14 ENCOUNTER — Inpatient Hospital Stay (HOSPITAL_COMMUNITY): Payer: Medicaid Other

## 2015-05-14 ENCOUNTER — Inpatient Hospital Stay (HOSPITAL_COMMUNITY): Payer: Medicaid Other | Admitting: Physical Therapy

## 2015-05-14 DIAGNOSIS — I96 Gangrene, not elsewhere classified: Secondary | ICD-10-CM | POA: Insufficient documentation

## 2015-05-14 DIAGNOSIS — L02519 Cutaneous abscess of unspecified hand: Secondary | ICD-10-CM | POA: Insufficient documentation

## 2015-05-14 DIAGNOSIS — A4901 Methicillin susceptible Staphylococcus aureus infection, unspecified site: Secondary | ICD-10-CM

## 2015-05-14 DIAGNOSIS — L02419 Cutaneous abscess of limb, unspecified: Secondary | ICD-10-CM | POA: Insufficient documentation

## 2015-05-14 DIAGNOSIS — I76 Septic arterial embolism: Secondary | ICD-10-CM | POA: Insufficient documentation

## 2015-05-14 DIAGNOSIS — M25532 Pain in left wrist: Secondary | ICD-10-CM

## 2015-05-14 DIAGNOSIS — M00232 Other streptococcal arthritis, left wrist: Secondary | ICD-10-CM | POA: Insufficient documentation

## 2015-05-14 DIAGNOSIS — I729 Aneurysm of unspecified site: Secondary | ICD-10-CM | POA: Insufficient documentation

## 2015-05-14 DIAGNOSIS — M86132 Other acute osteomyelitis, left radius and ulna: Secondary | ICD-10-CM | POA: Insufficient documentation

## 2015-05-14 LAB — PROTIME-INR
INR: 2.1 — ABNORMAL HIGH (ref 0.00–1.49)
Prothrombin Time: 23.4 seconds — ABNORMAL HIGH (ref 11.6–15.2)

## 2015-05-14 MED ORDER — WARFARIN SODIUM 7.5 MG PO TABS
7.5000 mg | ORAL_TABLET | Freq: Once | ORAL | Status: AC
  Administered 2015-05-14: 7.5 mg via ORAL
  Filled 2015-05-14: qty 1

## 2015-05-14 NOTE — Progress Notes (Addendum)
Physical Therapy Session Note  Patient Details  Name: [name] MRN: [id number] Date of Birth: 06/13/1956  Today's Date: 05/14/2015 PT Individual Time: 4098-1191 and 1520-1557 PT Individual Time Calculation (min): 60 min and 37 min  Short Term Goals: Week 1:  PT Short Term Goal 1 (Week 1): Pt will initiate ambulation during PT session.  PT Short Term Goal 2 (Week 1): Pt will tolerate sitting up in w/c oob x 4 hours.  PT Short Term Goal 3 (Week 1): Pt will initiate stand-pivot bed to/from w/c transfers.  PT Short Term Goal 4 (Week 1): Pt will initiate stair training during PT session.  PT Short Term Goal 5 (Week 1): Pt will demonstrate weight bearing through R heel during stand-pivot transfers in order to increase functional use of the extremity.   Skilled Therapeutic Interventions/Progress Updates:   Pt received in bed resting; pt stating that surgeon had been in the room discussing possible surgery for wrist due to osteomyelitis.  Also stating that another surgeon may be coming to assess R foot for intervention.  Pt now needing L wrist and R foot re-wrapped to get OOB.  Assisted pt with re-donning L wrist brace; notified RN to re-wrap R foot.  While awaiting re-dressing of R foot discussed again with pt possible equipment needs and having wife bring in current RW and discussed need for ramp vs. Stair negotiation depending on R foot.  Will discus at conference tomorrow.  Also discussed with RN need for [facility] for R foot for pressure relief, for positioning and ROM.  Once R foot re-dressed pt performed supine > sit with HOB elevated and bed rails with supervision with verbal cues to initiate.  Pt performed squat scoot to w/c with supervision with therapist holding w/c in position.  Educated pt on how to swing away and don/doff leg rests on w/c with mod A.  Transported to gym in w/c and performed gait training with PFRW with focus on safe management of RW during lateral hopping to L and R and retro  hopping with min A.  Also performed adjustment of L platform for improved shoulder and elbow positioning for WB/support.  Performed partial stand > sits beginning with bilat UE support progressing to one UE support for increased reliance/activation and strengthening of LE for transfers with min A overall.  Returned to room and pt set up in w/c for lunch and to rest before next therapy session.   PM session:  Pt still up in w/c c/o increased edema in LUE and R knee; continued to educate pt on use of elevation for edema management when OOB.  Pt transported to gym in w/c.  Continued sit <> stand training with pt performing partial stand<>sit from w/c with no UE support on RW and RUE touching arm rest; focused on eccentric control during lowering and concentric strengthening to stand.  Continued standing balance and stabilization training on LLE while performing open chain ROM/strengthening on RLE to mimic gait sequence and for glute strengthening with intermittent rest breaks in sitting to assess HR: 76 bpm; no episodes of LLE buckling.  Returned to room and performed lateral scoot into bed with supervision.  Pt performed sit > supine with supervision and pt set up with LE and LUE elevated above heart level.  Pt left with all items within reach.  Therapy Documentation Precautions:  Precautions Precautions: Fall Precaution Comments: no specific weight bearing order but avoided weight bearing through L wrist; wound to right LE and groin Restrictions  Weight Bearing Restrictions: Yes RLE Weight Bearing: Non weight bearing Other Position/Activity Restrictions: R wrist Vital Signs: Therapy Vitals Pulse Rate: 94 Resp: 18 BP: 110/67 mmHg Patient Position (if appropriate): Lying Oxygen Therapy SpO2: 98 % Pain: Pain Assessment Pain Assessment: 0-10 Pain Score: 8  Pain Type: Acute pain Pain Location: Leg Pain Orientation: Right Pain Descriptors / Indicators: Aching;Throbbing Pain Frequency:  Constant Pain Onset: On-going Patients Stated Pain Goal: 4 Pain Intervention(s): Medication (See eMAR) (percocet 2 po)  See Function Navigator for Current Functional Status.   Therapy/Group: Individual Therapy  Edman CircleHall, Mickle Campton Faucette 05/14/2015, 1:06 PM

## 2015-05-14 NOTE — Progress Notes (Signed)
ANTICOAGULATION CONSULT NOTE - Follow Up Consult  Pharmacy Consult for Coumadin Indication: DVT    Labs:  Recent Labs  05/12/15 0500 05/13/15 0516 05/14/15 0430  HGB  --  8.1*  --   HCT  --  27.3*  --   PLT  --  329  --   LABPROT 23.8* 22.3* 23.4*  INR 2.15* 1.97* 2.10*  CREATININE  --  0.95  --      Assessment: 59 y/o M with prolonged hospital stay for MSSA bacteremia and septic thrombophlebitis. He has been on coumadin for anticoagulation since 9/22. INR 2.1 today. Hgb 8.1, plt 329K, stable. No bleeding noted per chart.   Goal of Therapy:  INR 2-3 Monitor platelets by anticoagulation protocol: Yes   Plan:  - Coumadin 7.5 mg po x 1 - Daily INR while inpatient  Bayard HuggerMei Lonette Stevison, PharmD, BCPS  Clinical Pharmacist  Pager: [phone]

## 2015-05-14 NOTE — Progress Notes (Signed)
Subjective/Complaints: Appreciate hand surgery note. Recommending 6 weeks of antibiotics. Currently on Ancef IV Per OT patient is pushing against the foot board with his right foot, insensate in that area. Review of systems:No SOB or CP, good BM this morning.    Objective: Vital Signs: Blood pressure 103/44, pulse 82, temperature 97.8 F (36.6 C), temperature source Oral, resp. rate 18, height _0  (1.905 m), weight 102.059 kg (225 lb), SpO2 98 %. No results found. Results for orders placed or performed during the hospital encounter of 05/10/15 (from the past 72 hour(s))  Protime-INR     Status: Abnormal   Collection Time: 05/12/15  5:00 AM  Result Value Ref Range   Prothrombin Time 23.8 (H) 11.6 - 15.2 seconds   INR 2.15 (H) 0.00 - 1.49  CBC WITH DIFFERENTIAL     Status: Abnormal   Collection Time: 05/13/15  5:16 AM  Result Value Ref Range   WBC 7.9 4.0 - 10.5 K/uL   RBC 3.17 (L) 4.22 - 5.81 MIL/uL   Hemoglobin 8.1 (L) 13.0 - 17.0 g/dL   HCT 27.3 (L) 39.0 - 52.0 %   MCV 86.1 78.0 - 100.0 fL   MCH 25.6 (L) 26.0 - 34.0 pg   MCHC 29.7 (L) 30.0 - 36.0 g/dL   RDW 16.2 (H) 11.5 - 15.5 %   Platelets 329 150 - 400 K/uL   Neutrophils Relative % 40 %   Neutro Abs 3.2 1.7 - 7.7 K/uL   Lymphocytes Relative 47 %   Lymphs Abs 3.7 0.7 - 4.0 K/uL   Monocytes Relative 8 %   Monocytes Absolute 0.6 0.1 - 1.0 K/uL   Eosinophils Relative 4 %   Eosinophils Absolute 0.3 0.0 - 0.7 K/uL   Basophils Relative 1 %   Basophils Absolute 0.1 0.0 - 0.1 K/uL  Comprehensive metabolic panel     Status: Abnormal   Collection Time: 05/13/15  5:16 AM  Result Value Ref Range   Sodium 138 135 - 145 mmol/L   Potassium 4.5 3.5 - 5.1 mmol/L   Chloride 99 (L) 101 - 111 mmol/L   CO2 29 22 - 32 mmol/L   Glucose, Bld 105 (H) 65 - 99 mg/dL   BUN 18 6 - 20 mg/dL   Creatinine, Ser 0.95 0.61 - 1.24 mg/dL   Calcium 8.7 (L) 8.9 - 10.3 mg/dL   Total Protein 7.0 6.5 - 8.1 g/dL   Albumin 2.3 (L) 3.5 - 5.0 g/dL   AST 21 15 - 41 U/L   ALT 9 (L) 17 - 63 U/L   Alkaline Phosphatase 89 38 - 126 U/L   Total Bilirubin 0.4 0.3 - 1.2 mg/dL   GFR calc non Af Amer >60 >60 mL/min   GFR calc Af Amer >60 >60 mL/min    Comment: (NOTE) The eGFR has been calculated using the CKD EPI equation. This calculation has not been validated in all clinical situations. eGFR's persistently <60 mL/min signify possible Chronic Kidney Disease.    Anion gap 10 5 - 15  Protime-INR     Status: Abnormal   Collection Time: 05/13/15  5:16 AM  Result Value Ref Range   Prothrombin Time 22.3 (H) 11.6 - 15.2 seconds   INR 1.97 (H) 0.00 - 1.49  Protime-INR     Status: Abnormal   Collection Time: 05/14/15  4:30 AM  Result Value Ref Range   Prothrombin Time 23.4 (H) 11.6 - 15.2 seconds   INR 2.10 (H) 0.00 - 1.49  HEENT: normal Cardio: RRR and no murmur Resp: CTA B/L and unlabored GI: BS positive and NT, ND, evidence of drainage on clothing from abdominal wound, abdominal wound is closed with 2 scabbed areas but no evidence of active drainage. Extremity:  Pulses positive and No Edema Skin:   Breakdown Right foot with erythema and small areas of black eschar on great toe .  Left wrist has mild erythema dorsal ulnar aspect, also has edema of the fingers. Neuro: Alert/Oriented, Anxious, Abnormal Sensory reduced sensation to LT in left fingers and Right medial and ant thigh and right medial leg   Abnormal Motor 4- R hip add, 5/5 left Hip add  4/5 left  Knee ext , 2- RIght Knee ext , 3- Left hand grip, 3- Right ankle , 4- Left ankle , RUE normal Musc/Skel:  Other healed incision bilateral knee, reduced range of motion left wrist has dorsum of the hand edema as well as forearm edema. No significant joint swelling appreciated. Mild pain with gentle range of motion. More pain with supination pronation then with flexion and extension of the wrist. No pain with finger flexion or extension Gen NAD except complained of fatigue with minmal  activity   Assessment/Plan: 1. 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 Physiatrist is providing close team supervision and 24 hour management of active medical problems listed below. Physiatrist and rehab team continue to assess barriers to discharge/monitor patient progress toward functional and medical goals.  FIM: Function - Bathing Position: Shower Body parts bathed by patient: Right arm, Left arm, Chest, Abdomen, Front perineal area, Buttocks, Right upper leg, Left upper leg Body parts bathed by helper: Right lower leg, Back Assist Level: Supervision or verbal cues  Function- Upper Body Dressing/Undressing What is the patient wearing?: Pull over shirt/dress Pull over shirt/dress - Perfomed by patient: Thread/unthread right sleeve, Thread/unthread left sleeve, Put head through opening, Pull shirt over trunk Assist Level: Set up Set up : To obtain clothing/put away Function - Lower Body Dressing/Undressing What is the patient wearing?: Pants, Non-skid slipper socks Position: Standing at sink Pants- Performed by patient: Pull pants up/down, Fasten/unfasten pants Pants- Performed by helper: Thread/unthread right pants leg, Thread/unthread left pants leg Non-skid slipper socks- Performed by patient: Don/doff left sock Non-skid slipper socks- Performed by helper: Don/doff right sock, Don/doff left sock Assist for lower body dressing: Touching or steadying assistance (Pt > 75%)  Function - Toileting Toileting steps completed by patient: Adjust clothing prior to toileting, Performs perineal hygiene, Adjust clothing after toileting Assist level: Supervision or verbal cues Medical Problem List and Plan: 1. 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  DVT Prophylaxis/Anticoagulation: Coumadin for thromboembolism. Monitor for any bleeding episodes 3. Pain Management: MS Contin 90 mg twice a day, oxycodone for breakthrough pain. Monitor with increased mobility, at this point not much nerve pain in RLE, pain seems well controlled, no excess sedation 4. Mood: Effexor XR 75 mg daily, Klonopin 0.5 mg 4 times a day as needed 5. Neuropsych: This patient is capable of making decisions on his own behalf. 6. Skin/Wound Care: Skin care is supervised to right foot. 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 Sharol Given, still in  process of demarcation 7. Fluids/Electrolytes/Nutrition: Strict I and O's with follow-up chemistries 8.ID. Presently on Ancef 2 g 3 times a day. ID rec total 6 wks, UCx showed no growth so no changes needed 9. Tachycardia SVT. Follow-up cardiology services. Continue Lopressor 50 mg twice a day for now, HR ok at 82 this am 10. Constipation. Laxitive  assistance, per pt doing well, large BM this morning, continent 11.  Severe protein malnutrition, alb 1.6- supplement with beneprotein, enc hi protein diet choices Urinary retention-voiding well 12. Left wrist pain. History of distal radial fracture. X-rays indicate collapse of joint space over the last 6 weeks or so. In discussion with hand surgery does not appear to be an acutely septic joint. Appreciatehand surgery review of MRI. Will need at least 6 weeks of antibiotics, question broaden coverage. Infectious disease to address IV vs po and any changes in antibiotic coverage based on Wrist MRI findings  Function - Toilet Transfers Toilet transfer assistive device: Elevated toilet seat/BSC over toilet, Grab bar Assist level to toilet: Touching or steadying  assistance (Pt > 75%) Assist level from toilet: Touching or steadying assistance (Pt > 75%) Assist level to bedside commode (at bedside): Touching or steadying assistance (Pt > 75%) Assist level from bedside commode (at bedside): Touching or steadying assistance (Pt > 75%)  Function - Chair/bed transfer Chair/bed transfer method: Squat pivot, Ambulatory Chair/bed transfer assist level: Touching or steadying assistance (Pt > 75%) (supervision for squat/pivot, steady assist for ambulatory transfer) Chair/bed transfer assistive device: Walker, Armrests Chair/bed transfer details: Verbal cues for safe use of DME/AE, Verbal cues for precautions/safety  Function - Locomotion: Wheelchair Will patient use wheelchair at discharge?: Yes Type: Manual Max wheelchair distance: 130 Assist Level: Supervision or verbal cues Assist Level: Supervision or verbal cues Wheel 150 feet activity did not occur: Safety/medical concerns Turns around,maneuvers to table,bed, and toilet,negotiates 3% grade,maneuvers on rugs and over doorsills: No Function - Locomotion: Ambulation Assistive device: Walker-rolling Max distance: 60 Assist level: Touching or steadying assistance (Pt > 75%) Assist level: Touching or steadying assistance (Pt > 75%) Walk 50 feet with 2 turns activity did not occur: Safety/medical concerns Assist level: Touching or steadying assistance (Pt > 75%) Walk 150 feet activity did not occur: Safety/medical concerns Walk 10 feet on uneven surfaces activity did not occur: Safety/medical concerns  Function - Comprehension Comprehension: Auditory Comprehension assist level: Follows complex conversation/direction with extra time/assistive device  Function - Expression Expression: Verbal Expression assist level: Expresses complex ideas: With extra time/assistive device  Function - Social Interaction Social Interaction assist level: Interacts appropriately with others with medication or extra time  (anti-anxiety, antidepressant).  Function - Problem Solving Problem solving assist level: Solves basic problems with no assist  Function - Memory Memory assist level: Recognizes or recalls 90% of the time/requires cueing < 10% of the time Patient normally able to recall (first 3 days only): Current season, Location of own room, Staff names and faces, That he or she is in a hospital    LOS (Days) 4 A FACE TO [place] E 05/14/2015, 8:29 AM

## 2015-05-14 NOTE — Consult Note (Signed)
NEUROPSYCHOLOGY FOLLOW-UP - [facility]   MEDICAL NECESSITY:  [name] was seen on the [facility] owing to the patient's diagnosis of debility, chronic pain, and depression.    According to medical records, 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 Records also indicate that he is a "59 y.o. right handed male history of hypertension, chronic knee pain, peptic ulcer disease, MRSA abscesses, peripheral neuropathy. Independent with occasional cane and walker prior to admission living with wife.Presented 04/07/2015 with recent fall 04/03/2015 with increasing left hand and wrist pain. In the ED noted to be hypotensive, tachycardia and white blood cell count of 31,300. Placed on broad-spectrum antibiotics. MRI of left hand show dorsal distal radius transverse fracture, diffuse edema of the hand likely reactive to distal radius fracture, tear of the dorsal band of the scaphounate ligament. Multiple small deep fluid collections in the hands compatible with hematoma question superimposed infection. CT of right femur due to pain showed a 7.4 cm process involving or adjacent to the proximal right SFA question aneurysm, pseudoaneurysm or hematoma."  During today's visit, [name] explained that he was taken to the ICU recently owing to an arrhythmic event that he suffered that his care providers believe was secondary to fever. He said his heartrate went to 240 BPM. He was cardio-inverted and that resolved the situation. He has reportedly accepted what has happened and moved on. He denies and adjustment issues returning to rehab.   We talked more today about chronic depression and anxiety. [name] suffers continued issues with depression secondary to loss of functioning owing to  chronic pain/medical issues. He was essentially forced to retire because of his medical problems and has now had to rely on his father for financial support while he applies for disability benefits, which he has been doing for almost two years. This has been a huge blow to his ego and self-esteem and he remains constantly frustrated with his situation.   Cognitively, [name] continues to not be overly concerned, though he said he realizes that his pain medications slow him down to a degree. In fact, he has stopped driving because of the pain meds he takes.  He still plans to consult with a psychiatrist (Dr. Sandria Manly) upon discharge but we talked again about the benefit of counseling with a psychologist as well, particularly since his depression is reactionary. He agreed and plans to pursue this endeavor. I told him that I would confer with social work to see if they could offer some suggestions of providers in his area that could be of assistance and who accept Medicaid (e.g., [facility]). Suicidal/homicidal ideation, plan or intent was denied. No manic or hypomanic episodes were reported. The patient denied ever experiencing any auditory/visual hallucinations. No major behavioral or personality changes were endorsed.   Since his return, [name] feels that progress is being made in therapy and that he has not lost anything that he gained before leaving the unit. No barriers to therapy identified. He continues to have ample social support via his wife.    IMPRESSION: [name] continues to deny any major cognitive symptoms and no overt cognitive impairment was noted. Emotionally, he experiences longstanding depression in relation to loss of function secondary to chronic pain/medical. I spent a majority of the time discussing the underlying emotional traumas that led to his present  mental state. I encouraged him again to consider engaging in psychotherapy upon discharge which he plans to do. He  will also keep his initial consult with psychiatry. I plan to at least informally follow-up with the patient next week for support. He can formally be placed on my schedule as needed.     Debbe MountsAdam T. Lashay Osborne, Psy.D.  Clinical Neuropsychologist  Rehabilitation Psychologist

## 2015-05-14 NOTE — Progress Notes (Signed)
Occupational Therapy Session Note  Patient Details  Name: [name] MRN: [id number] Date of Birth: 05/19/1956  Today's Date: 05/14/2015 OT Individual Time: [id number] OT Individual Time Calculation (min): 60 min    Short Term Goals: Week 1:  OT Short Term Goal 1 (Week 1): [facility] Short Term Goal 2 (Week 1): Pt will dress don underwear/ pants with steadying assist.  OT Short Term Goal 3 (Week 1): Pt will complete all grooming tasks from seated level mod I OT Short Term Goal 4 (Week 1): Pt will demonstrate carryover of education of self-care skills with min questioning cues  Skilled Therapeutic Interventions/Progress Updates: ADL-retraining at bed level with focus on improved use of AE for lower body bathing/dressing, re-ed on conservative care plan for left wrist: AROM of digits, thumb, minimal wrist AROM to within limits of discomfort, edema control, re-ed on orthotics recommended for RLE.   Pt requires setup to self-feed breakfast and to provide supplies and vc to use reacher and LH sponge during BADL.  Pt bathes lower body performing side rolls independently to wash buttocks using bed rails as needed.   Pt dresses LB using reacher and min assist to lace right pant leg over dressing.   Pt bathes upper body unassisted using HOB elevated and requires min assist to don gown d/t IV line present.   Pt left in bed at end of session with all needs within reach.     Therapy Documentation Precautions:  Precautions Precautions: Fall Precaution Comments: no specific weight bearing order but avoided weight bearing through L wrist; wound to right LE and groin Restrictions Weight Bearing Restrictions: Yes RLE Weight Bearing: Non weight bearing Other Position/Activity Restrictions: R wrist  Vital Signs: Therapy Vitals Temp: 97.8 F (36.6 C) Temp Source: Oral Pulse Rate: 82 Resp: 18 BP: (!) 103/44 mmHg Patient Position (if appropriate):  Lying Oxygen Therapy SpO2: 98 % O2 Device: Not Delivered   Pain: Pain Assessment Pain Assessment: 0-10 Pain Score: 8  Pain Type: Acute pain Pain Location: Leg Pain Orientation: Right Pain Descriptors / Indicators: Throbbing Pain Frequency: Constant Pain Onset: On-going Patients Stated Pain Goal: 3 Pain Intervention(s): Medication (See eMAR)  See Function Navigator for Current Functional Status.   Therapy/Group: Individual Therapy   Second session: Time: 1330-1400 Time Calculation (min):  30 min  Pain Assessment: 6/10, RLE  Skilled Therapeutic Interventions: ADL-retraining with focus on toileting, toilet tranfers, functional mobility using platform walker.  Pt received seated in w/c and ready for continued treatment.   OT educates pt on necessity of skill in directing caregivers with assisting toilet transfers, complicated by use of portable IV pole.   Pt appreciates discussion and stands from w/c to platform walker with steadying assist.   Pt ambulates to bathroom.  Pt learns that IV pole must be adjusted to pass under privacy curtain support rod and must lead ahead of pt to prevent accidental entanglement with IV line during transfer.   Additionally, pt discovers that use of BSC over commode is required to improve transfer and use of grab bar on with RUE versus BSC armrest improves safety with transfer.   OT updates safety plan and advises pt to instruct facility RN techs to read safety plan updated info.   Pt voids small BM and performs clothing management and hygiene with supervision for safety.   Pt returns to w/c at end of session as IV team RN arrives for continued treatment.  See FIM for current functional status  Therapy/Group: Individual Therapy  [name] 05/14/2015, 8:46 AM

## 2015-05-14 NOTE — Consult Note (Signed)
[facility] for Infectious Disease    Date of Admission:  05/10/2015  Date of Consult:  05/14/2015  Reason for Consult: septic arthritis and osteomyelitis of wrist Referring Physician: Dr. Letta Pate   HPI: [name] is an 59 y.o. 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 and later by plastics later to cover wound.    Patient also developed a left hand abscess and MRI done on 04/07/15 showed:  1. Dorsal distal radius transverse fracture, partially visible. 2. Diffuse edema of the hand, likely reactive to distal radius fracture. 3. Tear of the dorsal band of the scapholunate ligament. 4. Multiple small deep fluid collections in the hands are most compatible with hematoma given the heterogeneous T2 signal. In this patient with an elevated white blood cell count, superimposed infection is possible but this would be rare and in the setting of wrist trauma, ordinary hematoma is most likely.   He had I and D of hand abscess   by Dr. Lenon Curt on 04/23/2015. Patient has remained on IV cefazolin since 04/10/15 and active abx since 04/06/15.  Despite this he has apparently been having increasing pain in his wrist, particular with increase in physical activity. Edema and pain have failed to go down and therefore an MRI was obtained by the primary team which showed:  edema in the distal radius, ulna,all of the carpal bones and bases of the metacarpals.   Extensive soft tissue swelling and marked synovial thickening are seen throughout the carpus. 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  Dr. Lenon Curt was reconsulted and saw the pt yesterday and he felt that the MRI was of  poor quality but there is no evidence of necrosis. He did not feel the patient needed surgery at this point in time rather needed wrist immobilization with a splint.   Of note the patient also has robotic lesions in his foot from his infected mycotic aneurysm some of which are necrotic and will likely require amputation ultimately. The right great toe and majora has had some purulent drainage from the site. Patient had been followed by not just wound care but also by orthopedic surgery. We had also seen the patient when Dr. Baxter Flattery and housestaff saw the patient on 05/08/2015.  We are now reconsult that given the MRI findings concerning for osteomyelitis and septic arthritis in the wrist and also the drainage from the patient's foot.  I would note the patient has had several cardiac arrhythmias recently I do not see that he ever underwent a transesophageal echocardiogram.   Past Medical History  Diagnosis Date  . Hypertension   . Chronic knee pain   . Hyperlipemia   . PUD (peptic ulcer disease)   . Dysrhythmia   . Peripheral vascular disease ([place])   . Anxiety   . GERD (gastroesophageal reflux disease)     Past Surgical History  Procedure Laterality Date  . I&d extremity Left 07/12/2013    Procedure: I&D Left Thigh Abscess;  Surgeon: Wylene Simmer, MD;  Location: [facility];  Service: Orthopedics;  Laterality: Left;  . Replacement total knee bilateral    . Tonsillectomy    . Hernia repair    . Cholecystectomy    .  Appendectomy    . Shoulder surgery    . Elbow surgery    . Femoral artery exploration Right 04/11/2015    Procedure: Resection of infected right femoral artery aneurysm;  Surgeon: Conrad Newcomerstown, MD;  Location: [facility];  Service: Vascular;  Laterality: Right;  . Incision and drainage abscess Right 04/11/2015    Procedure: INCISION AND DRAINAGE OF RIGHT THIGH ABSCESS;  Surgeon: Conrad Emmet, MD;  Location: [facility];  Service: Vascular;  Laterality: Right;  . Incision and drainage abscess  Right 04/13/2015    Procedure: INCISION AND DRAINAGE THIGH ABSCESS;  Surgeon: Conrad Cody, MD;  Location: [facility];  Service: Vascular;  Laterality: Right;  . I&d extremity Left 04/17/2015    Procedure: IRRIGATION AND DEBRIDEMENT LEFT EXTREMITY;  Surgeon: Conrad Haverhill, MD;  Location: [facility];  Service: Vascular;  Laterality: Left;  Marland Kitchen Muscle flap closure Right 04/19/2015    Procedure: RIGHT RECTUS ABDOMINUS  FLAP TO RIGHT GROIN;  Surgeon: Irene Limbo, MD;  Location: [facility];  Service: Plastics;  Laterality: Right;  . Femoral revision Right 04/19/2015    Procedure: OVERSEW RIGHT FEMORAL VEIN ;  Surgeon: Angelia Mould, MD;  Location: [facility];  Service: Vascular;  Laterality: Right;  . Wound exploration Right 04/23/2015    Procedure: EXAM UNDER ANESTHESIA AND VAC CHANGE RIGHT THIGH;  Surgeon: Irene Limbo, MD;  Location: [facility];  Service: Plastics;  Laterality: Right;    Social History:  reports that he has quit smoking. His smoking use included Cigarettes. He does not have any smokeless tobacco history on file. He reports that he does not drink alcohol or use illicit drugs.   Family History  Problem Relation Age of Onset  . Heart attack Paternal Grandmother 84  . Heart disease Mother 3    arrythmia  . Stroke Paternal Grandfather   . Stroke Maternal Grandfather     Allergies  Allergen Reactions  . Imitrex [Sumatriptan] Other (See Comments)    Chest pain, 2004 (tolerates 50 mg prn)  . Verapamil Other (See Comments)    Causes pvc's     Medications: I have reviewed patients current medications as documented in Epic Anti-infectives    Start     Dose/Rate Route Frequency Ordered Stop   05/10/15 2000  ceFAZolin (ANCEF) IVPB 2 g/50 mL premix     2 g 100 mL/hr over 30 Minutes Intravenous Every 8 hours 05/10/15 1600 05/28/15 0359   05/10/15 1600  famciclovir (FAMVIR) tablet 500 mg     500 mg Oral 2 times daily PRN 05/10/15 1600           ROS: Worsening pain and wrist to clear  with moving it around. He can get it into a place where it does not hurt no recent fevers chills does have drainage from his foot. Otherwise 12 point review of systems is negative   Blood pressure 102/60, pulse 76, temperature 97.7 F (36.5 C), temperature source Oral, resp. rate 18, height $RemoveBe'6\' 3"'fydDIFCCm$  (1.905 m), weight 225 lb (102.059 kg), SpO2 100 %. General: Alert and awake, oriented x3, not in any acute distress. HEENT: anicteric sclera,  EOMI, oropharynx clear and without exudate Cardiovascular: regular rate, normal r,  no murmur rubs or gallops Pulmonary: clear to auscultation bilaterally, no wheezing, rales or rhonchi Gastrointestinal: soft nontender, nondistended, normal bowel sounds, Musculoskeletal/ Skin  Left wrist with wound 05/14/15:     05/14/15: thigh surgical site with good granulation tissue present  05/14/15:Toes with purulent material between first and second toes and thrombotic areas visualized           Neuro: nonfocal, strength and sensation intact   Results for orders placed or performed during the hospital encounter of 05/10/15 (from the past 48 hour(s))  CBC WITH DIFFERENTIAL     Status: Abnormal   Collection Time: 05/13/15  5:16 AM  Result Value Ref Range   WBC 7.9 4.0 - 10.5 K/uL   RBC 3.17 (L) 4.22 - 5.81 MIL/uL   Hemoglobin 8.1 (L) 13.0 - 17.0 g/dL   HCT 27.3 (L) 39.0 - 52.0 %   MCV 86.1 78.0 - 100.0 fL   MCH 25.6 (L) 26.0 - 34.0 pg   MCHC 29.7 (L) 30.0 - 36.0 g/dL   RDW 16.2 (H) 11.5 - 15.5 %   Platelets 329 150 - 400 K/uL   Neutrophils Relative % 40 %   Neutro Abs 3.2 1.7 - 7.7 K/uL   Lymphocytes Relative 47 %   Lymphs Abs 3.7 0.7 - 4.0 K/uL   Monocytes Relative 8 %   Monocytes Absolute 0.6 0.1 - 1.0 K/uL   Eosinophils Relative 4 %   Eosinophils Absolute 0.3 0.0 - 0.7 K/uL   Basophils Relative 1 %   Basophils Absolute 0.1 0.0 - 0.1 K/uL  Comprehensive metabolic panel     Status: Abnormal   Collection Time: 05/13/15  5:16 AM    Result Value Ref Range   Sodium 138 135 - 145 mmol/L   Potassium 4.5 3.5 - 5.1 mmol/L   Chloride 99 (L) 101 - 111 mmol/L   CO2 29 22 - 32 mmol/L   Glucose, Bld 105 (H) 65 - 99 mg/dL   BUN 18 6 - 20 mg/dL   Creatinine, Ser 0.95 0.61 - 1.24 mg/dL   Calcium 8.7 (L) 8.9 - 10.3 mg/dL   Total Protein 7.0 6.5 - 8.1 g/dL   Albumin 2.3 (L) 3.5 - 5.0 g/dL   AST 21 15 - 41 U/L   ALT 9 (L) 17 - 63 U/L   Alkaline Phosphatase 89 38 - 126 U/L   Total Bilirubin 0.4 0.3 - 1.2 mg/dL   GFR calc non Af Amer >60 >60 mL/min   GFR calc Af Amer >60 >60 mL/min    Comment: (NOTE) The eGFR has been calculated using the CKD EPI equation. This calculation has not been validated in all clinical situations. eGFR's persistently <60 mL/min signify possible Chronic Kidney Disease.    Anion gap 10 5 - 15  Protime-INR     Status: Abnormal   Collection Time: 05/13/15  5:16 AM  Result Value Ref Range   Prothrombin Time 22.3 (H) 11.6 - 15.2 seconds   INR 1.97 (H) 0.00 - 1.49  Protime-INR     Status: Abnormal   Collection Time: 05/14/15  4:30 AM  Result Value Ref Range   Prothrombin Time 23.4 (H) 11.6 - 15.2 seconds   INR 2.10 (H) 0.00 - 1.49   '@BRIEFLABTABLE'$ (sdes,specrequest,cult,reptstatus)   ) Recent Results (from the past 720 hour(s))  Urine culture     Status: None   Collection Time: 05/03/15  6:10 PM  Result Value Ref Range Status   Specimen Description URINE, RANDOM  Final   Special Requests NONE  Final   Culture NO GROWTH 2 DAYS  Final   Report Status 05/05/2015 FINAL  Final  MRSA PCR Screening     Status: None   Collection Time: 05/08/15  9:08 AM  Result  Value Ref Range Status   MRSA by PCR NEGATIVE NEGATIVE Final    Comment:        The GeneXpert MRSA Assay (FDA approved for NASAL specimens only), is one component of a comprehensive MRSA colonization surveillance program. It is not intended to diagnose MRSA infection nor to guide or monitor treatment for MRSA infections.   Culture,  blood (routine x 2)     Status: None   Collection Time: 05/08/15 10:40 AM  Result Value Ref Range Status   Specimen Description BLOOD LEFT FINGER  Final   Special Requests BOTTLES DRAWN AEROBIC AND ANAEROBIC 10CC  Final   Culture NO GROWTH 5 DAYS  Final   Report Status 05/13/2015 FINAL  Final  Culture, blood (routine x 2)     Status: None   Collection Time: 05/08/15 10:47 AM  Result Value Ref Range Status   Specimen Description BLOOD LEFT ANTECUBITAL  Final   Special Requests BOTTLES DRAWN AEROBIC ONLY 10CC  Final   Culture NO GROWTH 5 DAYS  Final   Report Status 05/13/2015 FINAL  Final  Urine culture     Status: None   Collection Time: 05/08/15  7:42 PM  Result Value Ref Range Status   Specimen Description URINE, RANDOM  Final   Special Requests NONE  Final   Culture NO GROWTH 1 DAY  Final   Report Status 05/09/2015 FINAL  Final     Impression/Recommendation  Principal Problem:   Debility Active Problems:   Chronic pain syndrome   Cellulitis of left hand   Acute blood loss anemia   Nondisplaced fracture of distal end of left radius   Sepsis (HCC)   Femoral neuropathy of right lower extremity   Adjustment disorder with depressed mood   [name] is a 60 y.o. male with  MSSA bacteremia infected mycotic aneurysm and thigh abscess sp ALT surgery by vascular surgery then plastic surgeries also with fractured wrist and abscess in hand status post I&D of hand abscess, thrombotic lesions to toes where he has chronic wounds and areas that may require amputation now with MRI showing evidence of septic arthritis and osteomyelitis in his left wrist worries had worsening pain recently.  #1 Left wrist with increasing pain and MRI showing evidence of septic arthritis and osteomyelitis:  MRI was not optimal quality but disconcerting seeing these findings well into SECOND month of parenteral therapy  I will discuss further with Dr Lenon Curt. He must not feel that there is clear cut septic  arthritis or osteomyelitis due to image quality.   I would consider repeat imaging with hand in stable position, though perhaps give more time to also see if significant differences compared to recent scan IF no surgery planned  I DO worry if this site is infected and infection is not controlled could mean more morbidity for the patient and potential spread of the infection back out into the bloodstream and to distant metastatic sites including recent bleed repaired vascular sites.  #2 purulent drainage from foot where he has multiple thrombotic lesions and necrosis:  Would ask Dr. Sharol Given to come back and re-examine foot. I have seen the patient before but per [place] this drainage is new  #3 MSSA bacteremia: he never had TEE before and had all 4 arrhythmias would consider having the patient undergo transesophageal echocardiogram. Though if he would be poor surgical candidate then there may not be much use in this procedure  At present with areas in foot and hand that  are NOT resolved I would continue with parenteral therapy for an additional 8 weeks esp in light of report of osteomyelitis in his wrist. I would also plan on giving him protracted oral antibiotics afterwards as well since I don't think he is going to have resolution of all of his pathology in the very near future in particular lesions on the toes where he has thrombosis.     05/14/2015, 2:37 PM   Thank you so much for this interesting consult  [facility] [phone] (pager) [phone] (office) 05/14/2015, 2:37 PM  Rhina [place] 05/14/2015, 2:37 PM

## 2015-05-15 ENCOUNTER — Inpatient Hospital Stay (HOSPITAL_COMMUNITY): Payer: Medicaid Other | Admitting: Occupational Therapy

## 2015-05-15 ENCOUNTER — Inpatient Hospital Stay (HOSPITAL_COMMUNITY): Payer: Medicaid Other | Admitting: Physical Therapy

## 2015-05-15 DIAGNOSIS — L02419 Cutaneous abscess of limb, unspecified: Secondary | ICD-10-CM

## 2015-05-15 DIAGNOSIS — I96 Gangrene, not elsewhere classified: Secondary | ICD-10-CM

## 2015-05-15 LAB — PROTIME-INR
INR: 2.29 — ABNORMAL HIGH (ref 0.00–1.49)
Prothrombin Time: 25 seconds — ABNORMAL HIGH (ref 11.6–15.2)

## 2015-05-15 MED ORDER — GABAPENTIN 100 MG PO CAPS
100.0000 mg | ORAL_CAPSULE | Freq: Three times a day (TID) | ORAL | Status: DC
  Administered 2015-05-15 – 2015-05-25 (×30): 100 mg via ORAL
  Filled 2015-05-15 (×30): qty 1

## 2015-05-15 MED ORDER — CEFAZOLIN SODIUM-DEXTROSE 2-3 GM-% IV SOLR
2.0000 g | Freq: Three times a day (TID) | INTRAVENOUS | Status: DC
  Administered 2015-05-15 – 2015-05-17 (×7): 2 g via INTRAVENOUS
  Filled 2015-05-15 (×8): qty 50

## 2015-05-15 MED ORDER — WARFARIN SODIUM 7.5 MG PO TABS
7.5000 mg | ORAL_TABLET | Freq: Every day | ORAL | Status: DC
  Administered 2015-05-15: 7.5 mg via ORAL
  Filled 2015-05-15: qty 1

## 2015-05-15 NOTE — Progress Notes (Signed)
ANTICOAGULATION CONSULT NOTE - Follow Up Consult  Pharmacy Consult for Coumadin Indication: DVT    Labs:  Recent Labs  05/13/15 0516 05/14/15 0430 05/15/15 0505  HGB 8.1*  --   --   HCT 27.3*  --   --   PLT 329  --   --   LABPROT 22.3* 23.4* 25.0*  INR 1.97* 2.10* 2.29*  CREATININE 0.95  --   --      Assessment: 59 y/o M with prolonged hospital stay for MSSA bacteremia and septic thrombophlebitis. He has been on coumadin for anticoagulation since 9/28. INR 2.29 today. Hgb 8.1, plt 329K, stable. No bleeding noted per chart.   Goal of Therapy:  INR 2-3 Monitor platelets by anticoagulation protocol: Yes   Plan:  - Coumadin 7.5 mg po daily - Daily INR while inpatient  Bayard HuggerMei Larraine Argo, PharmD, BCPS  Clinical Pharmacist  Pager: [phone]

## 2015-05-15 NOTE — Progress Notes (Signed)
Patient ID: [name], male   DOB: August 12, 1955, 59 y.o.   MRN: [id number] The dry gangrene involving the majority of patient's right foot has resolved quite nicely. The dry gangrenous changes at the base of the first and second toes has developed into wet gangrene with exposure of the base of the first and second toe. At this time patient will require a great toe and second toe amputation with possible amputation of the metatarsal heads. We will plan for surgery Friday afternoon nothing by mouth after midnight Thursday.

## 2015-05-15 NOTE — Progress Notes (Signed)
Occupational Therapy Session Note  Patient Details  Name: [name] MRN: [id number] Date of Birth: 01-24-1956  Today's Date: 05/15/2015 OT Individual Time: 1500-1525 OT Individual Time Calculation (min): 25 min    Short Term Goals: Week 1:  OT Short Term Goal 1 (Week 1): Pt will complete stand pivot [facility] supervision OT Short Term Goal 2 (Week 1): Pt will dress don underwear/ pants with steadying assist.  OT Short Term Goal 3 (Week 1): Pt will complete all grooming tasks from seated level mod I OT Short Term Goal 4 (Week 1): Pt will demonstrate carryover of education of self-care skills with min questioning cues  Skilled Therapeutic Interventions/Progress Updates:    1:1 Pt in bed when arrived. Focused on bed mobility to come to EOB, short distance functional ambulation from EOB to bathroom toilet with steadying A and management of IV pole.  In bathroom pt able to manage clothing with close supervision.  Practicing doffing and donning pants with reacher and able to complete with setup with extra time. Pt able to doff left sock and donn slipper shoes with extra time.  Pt will require A for right foot to maintain safety with breakdown.  Able to stand and manage clothing back up with steadying A.  Ambulated back out to sink and participated in handwashing and donning deodorant in standing. Pt able to complete hand washing with steadying A but required min A to regain LOB with donning deodorant. Pt reports his left knee gives out. Return to bed with performing bed mobility with extra time mod I  Therapy Documentation Precautions:  Precautions Precautions: Fall Precaution Comments: no specific weight bearing order but avoided weight bearing through L wrist; wound to right LE and groin Restrictions Weight Bearing Restrictions: Yes RLE Weight Bearing: Non weight bearing Other Position/Activity Restrictions: R wrist Pain: Pain Assessment Pain Assessment: 0-10 Pain Score: 6   Pain Type: Acute pain Pain Location: Leg Pain Orientation: Right Pain Intervention(s): Medication (See eMAR)  See Function Navigator for Current Functional Status.   Therapy/Group: Individual Therapy  Roney MansSmith, Alonnah Lampkins [facility] 05/15/2015, 8:04 PM

## 2015-05-15 NOTE — Progress Notes (Signed)
Physical Therapy Session Note  Patient Details  Name: [name] MRN: [id number] Date of Birth: 01-Nov-1955  Today's Date: 05/15/2015 PT Individual Time: 0830-0930 PT Individual Time Calculation (min): 60 min   Short Term Goals: Week 1:  PT Short Term Goal 1 (Week 1): Pt will initiate ambulation during PT session.  PT Short Term Goal 2 (Week 1): Pt will tolerate sitting up in w/c oob x 4 hours.  PT Short Term Goal 3 (Week 1): Pt will initiate stand-pivot bed to/from w/c transfers.  PT Short Term Goal 4 (Week 1): Pt will initiate stair training during PT session.  PT Short Term Goal 5 (Week 1): Pt will demonstrate weight bearing through R heel during stand-pivot transfers in order to increase functional use of the extremity.   Skilled Therapeutic Interventions/Progress Updates:    Pt received up in w/c - agreeable to PT session. W/C Management - see function tab for details to & from gym - pt uses R UE and L LE due to pain in L wrist and R leg - on level surface with one short rest break < 30 seconds. Gait Training - see function tab for details - focus on weightbearing through R heel - pt demonstrates ability to weightbear through mid-foot, but heel still ~ half an inch from the ground and shortened L step length noted due to limp off of R LE. Therapeutic Exercise: Focus on B LE ROM and strengthenin with 2# ankle weight on L leg: heel slides x 10 reps arom, R knee extension with sheet looped around midfoot 5 seconds on/off x 10 reps, SLR x 10 reps with light AAROM on L side, ankle pumps x 20 reps, bridges x 10 reps with PT assistance for stabilization of R leg, clam shells x 10 reps, side lie hip abduction x 10 reps, side lie quadriceps stretch x 1 minute each side. 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 Pt ended in bed to rest. PT asks pt to have wife bring in lace-up or  velcro tennis shoe for R foot, and to purchase a cheap pair that is one size larger, if needed, to accommodate R foot swelling for improved support with upright mobility. Continue per PT POC.   Therapy Documentation Precautions:  Precautions Precautions: Fall Precaution Comments: no specific weight bearing order but avoided weight bearing through L wrist; wound to right LE and groin Restrictions Weight Bearing Restrictions: Yes RLE Weight Bearing: Non weight bearing Other Position/Activity Restrictions: R wrist Pain: Pain Assessment Pain Assessment: 0-10 Pain Score: 9  Pain Type: Acute pain Pain Location: Wrist Pain Orientation: Left Pain Descriptors / Indicators: Sharp Pain Frequency: Constant Pain Onset: With Activity Patients Stated Pain Goal: 5 Pain Intervention(s): Rest Multiple Pain Sites: Yes 2nd Pain Site Pain Score: 9 Pain Type: Acute pain Pain Location: Leg Pain Orientation: Right Pain Descriptors / Indicators: Sharp Pain Onset: With Activity Pain Intervention(s): Rest   See Function Navigator for Current Functional Status.   Therapy/Group: Individual Therapy  [name] 05/15/2015, 8:41 AM

## 2015-05-15 NOTE — Plan of Care (Signed)
Problem: RH Bathing Goal: LTG Patient will bathe with assist, cues/equipment (OT) LTG: Patient will bathe specified number of body parts with assist with/without cues using equipment (position) (OT)  Assistance needed for right foot due to wound  Problem: RH Dressing Goal: LTG Patient will perform lower body dressing w/assist (OT) LTG: Patient will perform lower body dressing with assist, with/without cues in positioning using equipment (OT)  Assistance needed for right  Foot due to wound

## 2015-05-15 NOTE — Progress Notes (Signed)
[facility]    Subjective: No new complaints   Antibiotics:  Anti-infectives    Start     Dose/Rate Route Frequency Ordered Stop   05/15/15 2000  ceFAZolin (ANCEF) IVPB 2 g/50 mL premix     2 g 100 mL/hr over 30 Minutes Intravenous Every 8 hours 05/15/15 1341     05/10/15 2000  ceFAZolin (ANCEF) IVPB 2 g/50 mL premix  Status:  Discontinued     2 g 100 mL/hr over 30 Minutes Intravenous Every 8 hours 05/10/15 1600 05/15/15 1341   05/10/15 1600  famciclovir (FAMVIR) tablet 500 mg     500 mg Oral 2 times daily PRN 05/10/15 1600        Medications: Scheduled Meds: . atorvastatin  80 mg Oral q1800  .  ceFAZolin (ANCEF) IV  2 g Intravenous Q8H  . collagenase   Topical Daily  . docusate sodium  100 mg Oral BID  . feeding supplement (ENSURE ENLIVE)  237 mL Oral BID BM  . flecainide  100 mg Oral Q12H  . gabapentin  100 mg Oral TID  . metoprolol tartrate  75 mg Oral BID  . morphine  90 mg Oral Q12H  . multivitamin with minerals  1 tablet Oral Daily  . naloxegol oxalate  25 mg Oral QAC breakfast  . naproxen  500 mg Oral BID WC  . oxyCODONE  30 mg Oral TID PC & HS  . polyethylene glycol  17 g Oral Daily  . venlafaxine XR  75 mg Oral Q breakfast  . warfarin  7.5 mg Oral q1800  . Warfarin - Pharmacist Dosing Inpatient   Does not apply q1800   Continuous Infusions:  PRN Meds:.acetaminophen **OR** acetaminophen, albuterol, clonazePAM, famciclovir, ondansetron, oxyCODONE-acetaminophen, sodium chloride, sorbitol, SUMAtriptan, trifluridine    Objective: Weight change:   Intake/Output Summary (Last 24 hours) at 05/15/15 1456 Last data filed at 05/15/15 1131  Gross per 24 hour  Intake    240 ml  Output    850 ml  Net   -610 ml   Blood pressure 96/58, pulse 69, temperature 97.7 F (36.5 C), temperature source Oral, resp. rate 17, height 6\' 3"  (1.905 m), weight 225 lb (102.059 kg), SpO2 97 %. Temp:  [97.7 F (36.5 C)] 97.7 F (36.5  C) (10/19 0553) Pulse Rate:  [69-79] 69 (10/19 0553) Resp:  [17] 17 (10/19 0553) BP: (96-112)/(58-65) 96/58 mmHg (10/19 0553) SpO2:  [97 %] 97 % (10/19 0553)  Physical Exam: General: Alert and awake, oriented x3, not in any acute distress. HEENT: anicteric sclera, EOMI, oropharynx clear and without exudate Cardiovascular: regular rate, normal r, no murmur rubs or gallops Pulmonary: clear to auscultation bilaterally, no wheezing, rales or rhonchi Gastrointestinal: soft nontender, nondistended, normal bowel sounds, Musculoskeletal/ Skin  Left wrist with wound 05/14/15:     05/14/15: thigh surgical site with good granulation tissue present      05/14/15:Toes with purulent material between first and second toes and thrombotic areas visualized           Neuro: nonfocal, strength and sensation intact   CBC:  CBC Latest Ref Rng 05/13/2015 05/10/2015 05/09/2015  WBC 4.0 - 10.5 K/uL 7.9 8.0 6.6  Hemoglobin 13.0 - 17.0 g/dL 8.1(L) 9.0(L) 8.4(L)  Hematocrit 39.0 - 52.0 % 27.3(L) 28.6(L) 28.2(L)  Platelets 150 - 400 K/uL 329 365 353       BMET  Recent Labs  05/13/15  0516  NA 138  K 4.5  CL 99*  CO2 29  GLUCOSE 105*  BUN 18  CREATININE 0.95  CALCIUM 8.7*     Liver Panel   Recent Labs  05/13/15 0516  PROT 7.0  ALBUMIN 2.3*  AST 21  ALT 9*  ALKPHOS 89  BILITOT 0.4       Sedimentation Rate No results for input(s): ESRSEDRATE in the last 72 hours. C-Reactive Protein No results for input(s): CRP in the last 72 hours.  Micro Results: Recent Results (from the past 720 hour(s))  Urine culture     Status: None   Collection Time: 05/03/15  6:10 PM  Result Value Ref Range Status   Specimen Description URINE, RANDOM  Final   Special Requests NONE  Final   Culture NO GROWTH 2 DAYS  Final   Report Status 05/05/2015 FINAL  Final  MRSA PCR Screening     Status: None   Collection Time: 05/08/15  9:08 AM  Result Value Ref Range Status   MRSA by  PCR NEGATIVE NEGATIVE Final    Comment:        The GeneXpert MRSA Assay (FDA approved for NASAL specimens only), is one component of a comprehensive MRSA colonization surveillance program. It is not intended to diagnose MRSA infection nor to guide or monitor treatment for MRSA infections.   Culture, blood (routine x 2)     Status: None   Collection Time: 05/08/15 10:40 AM  Result Value Ref Range Status   Specimen Description BLOOD LEFT FINGER  Final   Special Requests BOTTLES DRAWN AEROBIC AND ANAEROBIC 10CC  Final   Culture NO GROWTH 5 DAYS  Final   Report Status 05/13/2015 FINAL  Final  Culture, blood (routine x 2)     Status: None   Collection Time: 05/08/15 10:47 AM  Result Value Ref Range Status   Specimen Description BLOOD LEFT ANTECUBITAL  Final   Special Requests BOTTLES DRAWN AEROBIC ONLY 10CC  Final   Culture NO GROWTH 5 DAYS  Final   Report Status 05/13/2015 FINAL  Final  Urine culture     Status: None   Collection Time: 05/08/15  7:42 PM  Result Value Ref Range Status   Specimen Description URINE, RANDOM  Final   Special Requests NONE  Final   Culture NO GROWTH 1 DAY  Final   Report Status 05/09/2015 FINAL  Final    Studies/Results: No results found.    Assessment/Plan:  INTERVAL HISTORY:   05/14/15: Discussed case with [name] yesterday   Principal Problem:   Debility Active Problems:   Chronic pain syndrome   Cellulitis of left hand   Acute blood loss anemia   Nondisplaced fracture of distal end of left radius   Sepsis (HCC)   Femoral neuropathy of right lower extremity   Adjustment disorder with depressed mood   Mycotic aneurysm   Thigh abscess   Hand abscess   Septic embolism (HCC)   Gangrene of toe (HCC)   Acute osteomyelitis of left ulna (HCC)   Streptococcal arthritis of left wrist (HCC)    [name] is a 59 y.o. male with  with MSSA bacteremia infected mycotic aneurysm and thigh abscess sp ALT surgery by vascular surgery then  plastic surgeries also with fractured wrist and abscess in hand status post I&D of hand abscess, thrombotic lesions to toes where he has chronic wounds and areas that may require amputation now with MRI showing evidence of septic arthritis and  osteomyelitis in his left wrist worries had worsening pain recently.  #1 Left wrist with increasing pain and MRI showing evidence of septic arthritis and osteomyelitis:  MRI was not optimal quality but disconcerting seeing these findings well into SECOND month of parenteral therapy  Discussed  with Dr [name]. He does not feel that there is a clinical need for surgery at this time  We will therefore continue protracted systemic  Antibiotics and repeat an MRI in next 2-4 weeks to see if this process is getting better or worsening and needing surgical intervention  #2 purulent drainage from foot where he has multiple thrombotic lesions and necrosis:  I got in touch with  [name] and he will come back to re-examine foot.  WOC had recommended MRI but will await Dr Audrie Lia opinion first  #3 MSSA bacteremia: he never had TEE before and had all 4 arrhythmias would consider having the patient undergo transesophageal echocardiogram. Though if he would be poor surgical candidate then there may not be much use in this procedure  At present with areas in foot and hand that are NOT resolved I would continue with parenteral therapy for an additional 8 weeks esp in light of report of osteomyelitis in his wrist. I would also plan on giving him protracted oral antibiotics afterwards as well since I don't think he is going to have resolution of all of his pathology in the very near future in particular lesions on the toes where he has thrombosis.  I will work on outpatient followup with [place] if this has not yet been arranged.  I spent greater than 40 minutes with the patient including greater than 50% of time in face to face counsel of the patient re his MSSA bacteremia,  mycotic aneurysm infected wrist, osteomyelitis septic arthritis of wrist, thrombosis to toe with purulent drainage and in coordination of his  care.    LOS: 5 days   Acey Lav 05/15/2015, 2:56 PM

## 2015-05-15 NOTE — Progress Notes (Signed)
Subjective/Complaints: Appreciate ID no Recommending addnl 8 weeks of antibiotics. Currently on Ancef IV Increasing burning pain in right thigh Review of systems:No SOB or CP, good BM this morning.    Objective: Vital Signs: Blood pressure 96/58, pulse 69, temperature 97.7 F (36.5 C), temperature source Oral, resp. rate 17, height _0  (1.905 m), weight 102.059 kg (225 lb), SpO2 97 %. No results found. Results for orders placed or performed during the hospital encounter of 05/10/15 (from the past 72 hour(s))  CBC WITH DIFFERENTIAL     Status: Abnormal   Collection Time: 05/13/15  5:16 AM  Result Value Ref Range   WBC 7.9 4.0 - 10.5 K/uL   RBC 3.17 (L) 4.22 - 5.81 MIL/uL   Hemoglobin 8.1 (L) 13.0 - 17.0 g/dL   HCT 27.3 (L) 39.0 - 52.0 %   MCV 86.1 78.0 - 100.0 fL   MCH 25.6 (L) 26.0 - 34.0 pg   MCHC 29.7 (L) 30.0 - 36.0 g/dL   RDW 16.2 (H) 11.5 - 15.5 %   Platelets 329 150 - 400 K/uL   Neutrophils Relative % 40 %   Neutro Abs 3.2 1.7 - 7.7 K/uL   Lymphocytes Relative 47 %   Lymphs Abs 3.7 0.7 - 4.0 K/uL   Monocytes Relative 8 %   Monocytes Absolute 0.6 0.1 - 1.0 K/uL   Eosinophils Relative 4 %   Eosinophils Absolute 0.3 0.0 - 0.7 K/uL   Basophils Relative 1 %   Basophils Absolute 0.1 0.0 - 0.1 K/uL  Comprehensive metabolic panel     Status: Abnormal   Collection Time: 05/13/15  5:16 AM  Result Value Ref Range   Sodium 138 135 - 145 mmol/L   Potassium 4.5 3.5 - 5.1 mmol/L   Chloride 99 (L) 101 - 111 mmol/L   CO2 29 22 - 32 mmol/L   Glucose, Bld 105 (H) 65 - 99 mg/dL   BUN 18 6 - 20 mg/dL   Creatinine, Ser 0.95 0.61 - 1.24 mg/dL   Calcium 8.7 (L) 8.9 - 10.3 mg/dL   Total Protein 7.0 6.5 - 8.1 g/dL   Albumin 2.3 (L) 3.5 - 5.0 g/dL   AST 21 15 - 41 U/L   ALT 9 (L) 17 - 63 U/L   Alkaline Phosphatase 89 38 - 126 U/L   Total Bilirubin 0.4 0.3 - 1.2 mg/dL   GFR calc non Af Amer >60 >60 mL/min   GFR calc Af Amer >60 >60 mL/min    Comment: (NOTE) The eGFR has been  calculated using the CKD EPI equation. This calculation has not been validated in all clinical situations. eGFR's persistently <60 mL/min signify possible Chronic Kidney Disease.    Anion gap 10 5 - 15  Protime-INR     Status: Abnormal   Collection Time: 05/13/15  5:16 AM  Result Value Ref Range   Prothrombin Time 22.3 (H) 11.6 - 15.2 seconds   INR 1.97 (H) 0.00 - 1.49  Protime-INR     Status: Abnormal   Collection Time: 05/14/15  4:30 AM  Result Value Ref Range   Prothrombin Time 23.4 (H) 11.6 - 15.2 seconds   INR 2.10 (H) 0.00 - 1.49  Protime-INR     Status: Abnormal   Collection Time: 05/15/15  5:05 AM  Result Value Ref Range   Prothrombin Time 25.0 (H) 11.6 - 15.2 seconds   INR 2.29 (H) 0.00 - 1.49     HEENT: normal Cardio: RRR and no murmur Resp:  CTA B/L and unlabored GI: BS positive and NT, ND, evidence of drainage on clothing from abdominal wound, abdominal wound is closed with 2 scabbed areas but no evidence of active drainage. Extremity:  Pulses positive and No Edema Skin:   Breakdown Right foot with erythema and small areas of black eschar on great toe .  Left wrist has mild erythema dorsal ulnar aspect, also has edema of the fingers. Neuro: Alert/Oriented, Anxious, Abnormal Sensory reduced sensation to LT in left fingers and Right medial and ant thigh and right medial leg   Abnormal Motor 4- R hip add, 5/5 left Hip add  4/5 left  Knee ext , 2- RIght Knee ext , 3- Left hand grip, 3- Right ankle , 4- Left ankle , RUE normal Musc/Skel:  Other healed incision bilateral knee, reduced range of motion left wrist has dorsum of the hand edema as well as forearm edema. No significant joint swelling appreciated. Mild pain with gentle range of motion. More pain with supination pronation then with flexion and extension of the wrist. No pain with finger flexion or extension Gen NAD except complained of fatigue with minmal activity   Assessment/Plan: 1. Functional deficits secondary   to debilitation/sepsis/multi-medical/MRSA abscesses with multiple irrigation and debridements with rectus flap procedure 04/19/2015 also with Right femoral neuropathy  which require 3+ hours per day of interdisciplinary therapy in a comprehensive inpatient rehab setting. Physiatrist is providing close team supervision and 24 hour management of active medical problems listed below. Physiatrist and rehab team continue to assess barriers to discharge/monitor patient progress toward functional and medical goals.  FIM: Function - Bathing Position: Wheelchair/chair at sink Body parts bathed by patient: Right arm, Left arm, Chest, Abdomen, Front perineal area, Buttocks, Right upper leg, Left upper leg, Left lower leg Body parts bathed by helper: Back, Right lower leg Assist Level: Touching or steadying assistance(Pt > 75%)  Function- Upper Body Dressing/Undressing What is the patient wearing?: Pull over shirt/dress Pull over shirt/dress - Perfomed by patient: Thread/unthread right sleeve, Thread/unthread left sleeve, Pull shirt over trunk, Put head through opening Pull over shirt/dress - Perfomed by helper: Put head through opening Assist Level: Set up Set up : To obtain clothing/put away Function - Lower Body Dressing/Undressing What is the patient wearing?: Pants, Non-skid slipper socks Position: Bed Pants- Performed by patient: Thread/unthread right pants leg, Thread/unthread left pants leg, Pull pants up/down, Fasten/unfasten pants Pants- Performed by helper: Thread/unthread right pants leg Non-skid slipper socks- Performed by patient: Don/doff left sock Non-skid slipper socks- Performed by helper: Don/doff left sock Assist for footwear: Dependant Assist for lower body dressing: Touching or steadying assistance (Pt > 75%)  Function - Toileting Toileting activity did not occur: N/A Toileting steps completed by patient: Adjust clothing prior to toileting, Performs perineal hygiene, Adjust  clothing after toileting Toileting steps completed by helper: Adjust clothing prior to toileting, Performs perineal hygiene, Adjust clothing after toileting Toileting Assistive Devices: Grab bar or rail Assist level: Supervision or verbal cues Medical Problem List and Plan: 1. 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  DVT Prophylaxis/Anticoagulation: Coumadin for thromboembolism. Monitor for any bleeding episodes 3. Pain Management: MS Contin 90 mg twice a day, oxycodone for breakthrough pain. Monitor with increased mobility, Developing increased nerve pain in RLE, Start low-dose gabapentin, titrate upward 4. Mood: Effexor XR 75 mg daily, Klonopin 0.5 mg 4 times a day as needed  5. Neuropsych: This patient is capable of making decisions on his own behalf. 6. Skin/Wound Care: Skin care is supervised to right foot. 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 Sharol Given, still in process of demarcation 7. Fluids/Electrolytes/Nutrition: Strict I and O's with follow-up chemistries 8.ID. Presently on Ancef 2 g 3 times a day. ID rec total 12 wks, 9. Tachycardia SVT. Follow-up cardiology services. Continue Lopressor 50 mg twice a day for now, HR ok at 82 this am 10. Constipation. Laxitive  assistance, per pt doing well, large BM this morning, continent 11.  Severe protein malnutrition, alb 1.6- supplement with beneprotein, enc hi protein diet choices Urinary retention-voiding well 12. Left wrist pain. History of distal radial fracture. X-rays indicate collapse of joint space over the last 6 weeks or so. In discussion with hand surgery does not appear to be an acutely septic joint. Appreciatehand surgery review of MRI. Will need at least 6 weeks of antibiotics, question broaden coverage. Infectious disease  rec same abx for addnl 8 weeks.  I spoke to Dr Lenon Curt.  MRI quality adequate to judge need for addnl surgery ( no surgery indicated)  Function - Toilet Transfers Toilet transfer assistive device: Elevated toilet seat/BSC over toilet, Grab bar Assist level to toilet: Touching or steadying assistance (Pt > 75%) Assist level from toilet: Touching or steadying assistance (Pt > 75%) Assist level to bedside commode (at bedside): Touching or steadying assistance (Pt > 75%) Assist level from bedside commode (at bedside): Touching or steadying assistance (Pt > 75%)  Function - Chair/bed transfer Chair/bed transfer method: Lateral scoot, Stand pivot Chair/bed transfer assist level: Touching or steadying assistance (Pt > 75%) Chair/bed transfer assistive device: Walker, Armrests Chair/bed transfer details: Verbal cues for safe use of DME/AE, Verbal cues for precautions/safety  Function - Locomotion: Wheelchair Will patient use wheelchair at discharge?: Yes Type: Manual Max wheelchair distance: 130 Assist Level: Supervision or verbal cues Assist Level: Supervision or verbal cues Wheel 150 feet activity did not occur: Safety/medical concerns Turns around,maneuvers to table,bed, and toilet,negotiates 3% grade,maneuvers on rugs and over doorsills: No Function - Locomotion: Ambulation Assistive device: Walker-platform Max distance: 10 Assist level: Touching or steadying assistance (Pt > 75%) Assist level: Touching or steadying assistance (Pt > 75%) Walk 50 feet with 2 turns activity did not occur: Safety/medical concerns Assist level: Touching or steadying assistance (Pt > 75%) Walk 150 feet activity did not occur: Safety/medical concerns Walk 10 feet on uneven surfaces activity did not occur: Safety/medical concerns  Function - Comprehension Comprehension: Auditory Comprehension assist level: Follows complex conversation/direction with extra time/assistive device  Function -  Expression Expression: Verbal Expression assist level: Expresses complex ideas: With extra time/assistive device  Function - Social Interaction Social Interaction assist level: Interacts appropriately with others with medication or extra time (anti-anxiety, antidepressant).  Function - Problem Solving Problem solving assist level: Solves basic problems with no assist  Function - Memory Memory assist level: Recognizes or recalls 90% of the time/requires cueing < 10% of the time Patient normally able to recall (first 3 days only): Current season, Location of own room, Staff names and faces, That he or she is in a hospital    LOS (Days) 5 A FACE TO [place] E 05/15/2015, 8:33 AM

## 2015-05-15 NOTE — Progress Notes (Signed)
Occupational Therapy Session Note  Patient Details  Name: Craig MunchJimmy F Jorden MRN: [id number] Date of Birth: August 16, 1955  Today's Date: 05/15/2015 OT Individual Time: [id number] and 1300-1357 OT Individual Time Calculation (min): 54 min and 57 min    Short Term Goals: Week 1:  OT Short Term Goal 1 (Week 1): [facility] supervision OT Short Term Goal 2 (Week 1): Pt will dress don underwear/ pants with steadying assist.  OT Short Term Goal 3 (Week 1): Pt will complete all grooming tasks from seated level mod I OT Short Term Goal 4 (Week 1): Pt will demonstrate carryover of education of self-care skills with min questioning cues  Skilled Therapeutic Interventions/Progress Updates:  Session 1: Upon entering the room, pt supine in bed with 6/10 c/o pain in L wrist. OT intervention with focus on energy conservation, self care retraining, functional transfers, and education. Pt performing scoot transfer with supervision this session. Pt declined shower secondary to fatigue but agreeable to sink bathing. Pt requiring min verbal guidance cues for use of AE for LB self care tasks. Pt standing at sink with steady assist while he managed fasteners on pants. Pt utilizing long handle reacher and bath sponge for self care this session in order to increase independence. Pt remaining in wheelchair at end of session with breakfast tray placed in front of him. Pt able to independently don and doff wrist splint this session. Pt able to open all containers for breakfast with 1 hand as well. Call bell and all needed items within reach upon exiting the room.   Session 2: Upon entering the room, pt supine in bed with 4/10 c/o pain in L wrist. Pt performing squat pivot transfer with supervision assistance into wheelchair. Pt requesting to shave face at sink side. He performed grooming task with set up A and while shaving OT educated pt on progress towards goals. Pt propelled wheelchair 6670' with  supervision and use of R hand and L foot as other extremities causing too much pain to utilize. Pt requiring rest breaks and then returning to room in same manner. Sit >stand from wheelchair with steady assist. Pt ambulating with platform RW into bathroom ~ 15' with steady assist for toilet transfer. Pt preforming all LB clothing management and hygiene with supervision for safety. Pt returning to wheelchair by ambulating in same manner as stated above. Pt clearly fatigued and required rest break. Call bell and all needed items within reach upon exiting the room.   Therapy Documentation Precautions:  Precautions Precautions: Fall Precaution Comments: no specific weight bearing order but avoided weight bearing through L wrist; wound to right LE and groin Restrictions Weight Bearing Restrictions: Yes RLE Weight Bearing: Non weight bearing Other Position/Activity Restrictions: R wrist General:   Vital Signs: Therapy Vitals Temp: 97.7 F (36.5 C) Temp Source: Oral Pulse Rate: 69 Resp: 17 BP: (!) 96/58 mmHg Patient Position (if appropriate): Lying Oxygen Therapy SpO2: 97 % O2 Device: Not Delivered Pain: Pain Assessment Pain Assessment: 0-10 Pain Score: 8   See Function Navigator for Current Functional Status.   Therapy/Group: Individual Therapy  Lowella Gripittman, Zamyah Wiesman L 05/15/2015, 8:04 AM

## 2015-05-15 NOTE — Progress Notes (Signed)
Orthopedic Tech Progress Note Patient Details:  Gerhard MunchJimmy F Logan June 10, 1956 [id number]  Ortho Devices Type of Ortho Device: Postop shoe/boot Ortho Device/Splint Location: RLE prafo boot Ortho Device/Splint Interventions: Ordered, Application   Jennye MoccasinHughes, Mataeo Ingwersen Craig 05/15/2015, 6:14 PM

## 2015-05-16 ENCOUNTER — Inpatient Hospital Stay (HOSPITAL_COMMUNITY): Payer: Medicaid Other

## 2015-05-16 ENCOUNTER — Other Ambulatory Visit (HOSPITAL_COMMUNITY): Payer: Self-pay | Admitting: Orthopedic Surgery

## 2015-05-16 ENCOUNTER — Inpatient Hospital Stay (HOSPITAL_COMMUNITY): Payer: Medicaid Other | Admitting: Physical Therapy

## 2015-05-16 ENCOUNTER — Inpatient Hospital Stay (HOSPITAL_COMMUNITY): Payer: Medicaid Other | Admitting: Occupational Therapy

## 2015-05-16 DIAGNOSIS — F4321 Adjustment disorder with depressed mood: Secondary | ICD-10-CM

## 2015-05-16 DIAGNOSIS — R5381 Other malaise: Secondary | ICD-10-CM

## 2015-05-16 DIAGNOSIS — Z79899 Other long term (current) drug therapy: Secondary | ICD-10-CM

## 2015-05-16 DIAGNOSIS — G5721 Lesion of femoral nerve, right lower limb: Secondary | ICD-10-CM

## 2015-05-16 DIAGNOSIS — G5612 Other lesions of median nerve, left upper limb: Secondary | ICD-10-CM

## 2015-05-16 DIAGNOSIS — R000 Tachycardia, unspecified: Secondary | ICD-10-CM

## 2015-05-16 LAB — PROTIME-INR
INR: 2.49 — ABNORMAL HIGH (ref 0.00–1.49)
PROTHROMBIN TIME: 26.6 s — AB (ref 11.6–15.2)

## 2015-05-16 MED ORDER — WARFARIN SODIUM 5 MG PO TABS
5.0000 mg | ORAL_TABLET | ORAL | Status: DC
Start: 2015-05-16 — End: 2015-05-18

## 2015-05-16 MED ORDER — WARFARIN SODIUM 7.5 MG PO TABS: 7.5000 mg | ORAL_TABLET | ORAL | Status: DC

## 2015-05-16 NOTE — Progress Notes (Signed)
Nutrition Follow-up  DOCUMENTATION CODES:   Not applicable  INTERVENTION:   Continue Ensure Enlive po BID, each supplement provides 350 kcal and 20 grams of protein.  Encourage adequate PO intake.   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 He was ultimately diagnoses w/ MSSA bacteremia and underwent multiple debridements of the right thigh/inguinal area and left wrist, to include a muscle flap closure of his extensive R femoral region wound w/ exposed vessels. He was discharged to [facility] on 04/30/2015 but returned to acute hospital after he developed SVT 10/12. Pt d/c'ed back to CIR 10/14.   Plans for NPO at midnight as pt scheduled for OR tomorrow for R toe amputations with possible amputation of the metatarsal heads from dry gangrene.  Meal completion has been 100%. Intake has been adequate. Pt currently has Ensure ordered with varied consumption. RD to continue with current orders to aid in wound healing especially after procedure tomorrow. Pt encouraged to eat his food at meals and to drink his supplements.   Diet Order:  Diet Heart Room service appropriate?: Yes; Fluid consistency:: Thin Diet NPO time specified  Skin:  Wound (see comment) (Unstageable ulcer on R heel, dry gangrene R toes)  Last BM:  10/19  Height:   Ht Readings from Last 1 Encounters:  05/10/15 $RemoveB'6\' 3"'TETTWzlG$  (1.905 m)    Weight:   Wt Readings from Last 1 Encounters:  05/10/15 225 lb (102.059 kg)    Ideal Body Weight:  89.1 kg  BMI:   Body mass index is 28.12 kg/(m^2).  Estimated Nutritional Needs:   Kcal:  2200-2400  Protein:  115-130 grams  Fluid:  > 2.2 L/day  EDUCATION NEEDS:   No education needs identified at this time  Corrin Parker, MS, RD, LDN Pager # [phone] After hours/ weekend pager # [phone]

## 2015-05-16 NOTE — Progress Notes (Signed)
Physical Therapy Session Note  Patient Details  Name: Gerhard MunchJimmy F Kuhar MRN: [id number] Date of Birth: December 27, 1955  Today's Date: 05/16/2015 PT Individual Time: 1030-1130 PT Individual Time Calculation (min): 60 min   Short Term Goals: Week 1:  PT Short Term Goal 1 (Week 1): Pt will initiate ambulation during PT session.  PT Short Term Goal 2 (Week 1): Pt will tolerate sitting up in w/c oob x 4 hours.  PT Short Term Goal 3 (Week 1): Pt will initiate stand-pivot bed to/from w/c transfers.  PT Short Term Goal 4 (Week 1): Pt will initiate stair training during PT session.  PT Short Term Goal 5 (Week 1): Pt will demonstrate weight bearing through R heel during stand-pivot transfers in order to increase functional use of the extremity.   Skilled Therapeutic Interventions/Progress Updates:    Pt received up in w/c - ready for [facility] PT educates pt that [facility] only needs to be worn when in bed, but is the type of boot that can be used for ambulation since it has a rubber sole. Pt reports he is heading back to surgery Friday for 1st and 2nd toe amputation due to necrotic toes. PT gave pt a written handout with 3 ramp companies to call who will rent temporary ramps for pt to use at home. W/C Management - See function tab for details. Pt uses R UE and L LE with shoe on - increased time req. Gait Training - see function tab for details - Pt req verbal cues to stay close to the PFRW, but no LOB req assist to correct - pt maintains TTWB or NWB on R LE due to planned amputation surgery on toes. Therapeutic Activity - Again, PT cues pt to maintain TTWB or NWB on R foot during stand-pivot transfers with PFRW. During scoot/squat-pivot transfer PT instructs pt in bottom clearance by leaning trunk forward so as to avoid puncture injury from brake handle. See function tab for toileting, transfer, and bed mobility details. Therapeutic Exercise - PT instructs pt in B LE AROM and strengthening exercises with  3# ankle weight on L LE and 0.5# ankle weight on R LE: heel slides 2 x 10 reps, knee extension isometric 5 seconds on/off with sheet around midfoot for resistance to push into 2 x 10 reps. Pt ended in bed with all needs in reach.   Therapy Documentation Precautions:  Precautions Precautions: Fall Precaution Comments: no specific weight bearing order but avoided weight bearing through L wrist; wound to right LE and groin Restrictions Weight Bearing Restrictions: Yes RLE Weight Bearing: Non weight bearing Other Position/Activity Restrictions: R wrist Pain: Pain Assessment Pain Assessment: 0-10 Pain Score: 6  Pain Type: Acute pain Pain Location: Leg Pain Orientation: Right Pain Descriptors / Indicators: Aching Pain Onset: With Activity Patients Stated Pain Goal: 5 Pain Intervention(s): Medication (See eMAR)   See Function Navigator for Current Functional Status.   Therapy/Group: Individual Therapy  Jaelan Rasheed M 05/16/2015, 11:03 AM

## 2015-05-16 NOTE — Progress Notes (Signed)
Occupational Therapy Session Note  Patient Details  Name: [name] MRN: [id number] Date of Birth: May 10, 1956  Today's Date: 05/16/2015 OT Individual Time: [id number] OT Individual Time Calculation (min): 60 min    Short Term Goals: Week 1:  OT Short Term Goal 1 (Week 1): Pt will complete stand pivot [facility] supervision OT Short Term Goal 2 (Week 1): Pt will dress don underwear/ pants with steadying assist.  OT Short Term Goal 3 (Week 1): Pt will complete all grooming tasks from seated level mod I OT Short Term Goal 4 (Week 1): Pt will demonstrate carryover of education of self-care skills with min questioning cues  Skilled Therapeutic Interventions/Progress Updates: ADL-retraining at shower level with focus on adapted bathing/dressing using AE, prn.   Transfers, safety awareness, and dynamic standing balance.   Pt performs bed mobility and transfers with overall setup and supervision assist, min vc for safety to check brakes and position near tub bench.   Pt bathes unassisted without dressing on right foot this date, using LH sponge to reach feet and back, scooting to edge of bench to wash buttocks performing lateral leans.   Pt dresses at sink allow OT inspection of right foot.   OT notes pressure blister at right lateral ankle and alerts RN who provides additional inspection and care, as needed.   Pt grooms at sink with setup.   Pt able to use reacher to lace pants this date but requires min assist only to don right non-slip sock.  Pt left in w/c near bed with all needs within reach.     Therapy Documentation Precautions:  Precautions Precautions: Fall Precaution Comments: no specific weight bearing order but avoided weight bearing through L wrist; wound to right LE and groin Restrictions Weight Bearing Restrictions: Yes RLE Weight Bearing: Non weight bearing Other Position/Activity Restrictions: R wrist  Pain: Pain Assessment Pain Assessment: 0-10 Pain Score: 8   Pain Type: Acute pain Pain Location: Leg Pain Orientation: Right Pain Intervention(s): Medication (See eMAR)  See Function Navigator for Current Functional Status.   Therapy/Group: Individual Therapy   Second session: Time: 1300-1400 Time Calculation (min):  60 min  Pain Assessment: 5/10, right foot  Skilled Therapeutic Interventions: Therapeutic activity with focus on improved dynamic standing balance, functional endurance, transfers, UE strengthening.    Pt completes bed mobility and stand-pivot transfer to w/c using bed rails, arm rest, and platform walker with setup and steadying assist during pivot to w/c.   Pt completes UE strengthening at TES machine, 7.5 lbs resistance, 3 sets of 10 reps, right bicep curls, right shoulder pull-downs, with 3 rest breaks, moderate exertion.    Pt is measured for circumferential custom orthosis to left wrist/hand intended for showering for protection d/t potential loss of balance and inadvertent weight-bearing through left wrist while standing performing transfers.   Pt completes iADL task, loading washing machine, standing and maintaining balance using machine tops for stability while completing task; pt reports that he has performed similar home making tasks as needed while his wife maintains her employment.   Pt is escorted back his room and elects to remain in w/c at end of session with all needs within reach.  See FIM for current functional status  Therapy/Group: Individual Therapy  Kileigh Ortmann 05/16/2015, 12:27 PM

## 2015-05-16 NOTE — Progress Notes (Signed)
Occupational Therapy Session Note  Patient Details  Name: Craig MunchJimmy F [name] MRN: [id number] Date of Birth: 1956/06/30  Today's Date: 05/16/2015 OT Individual Time: [id number] OT Individual Time Calculation (min): 29 min    Skilled Therapeutic Interventions/Progress Updates:    Pt completed laundry management from wheelchair level with supervision.  Incorporated the reacher for retrieving clothing from the dryer from the wheelchair.  Pt able to remove right leg rest and push the wheelchair to and from the laundry room with modified independence.  Once back to the room he transferred scoot pivot to the bed with supervision as well as transitioning to supine.  Call button and phone placed in reach.    Therapy Documentation Precautions:  Precautions Precautions: Fall Precaution Comments: no specific weight bearing order but avoided weight bearing through L wrist; wound to right LE and groin Restrictions Weight Bearing Restrictions: Yes RLE Weight Bearing: Non weight bearing Other Position/Activity Restrictions: R wrist  Pain: Pain Assessment Pain Assessment: Faces Pain Score: 6  Faces Pain Scale: Hurts a little bit Pain Type: Acute pain Pain Location: Leg Pain Orientation: Right Pain Intervention(s): RN made aware;Repositioned;Emotional support ADL: See Function Navigator for Current Functional Status.   Therapy/Group: Individual Therapy  Jaclyn Carew OTR/L 05/16/2015, 3:38 PM

## 2015-05-16 NOTE — Plan of Care (Signed)
Problem: RH Stairs Goal: LTG Patient will ambulate up and down stairs w/assist (PT) LTG: Patient will ambulate up and down # of stairs with assistance (PT)  Outcome: Not Applicable Date Met:  94/37/00 Pt plans to have ramp installed for home entry.

## 2015-05-16 NOTE — Consult Note (Addendum)
WOC follow-up: Dr Lajoyce Cornersuda following for assessment and plan of care to right foot wounds.  He plans to take to the OR on Fri for toe amputation according to progress notes.   Please re-consult if further assistance is needed.  Thank-you,  Cammie Mcgeeawn Naheim Burgen MSN, RN, CWOCN, [place], CNS [phone]

## 2015-05-16 NOTE — Progress Notes (Signed)
Subjective/Complaints: Patient states he slept well overnight.  He states that he is a little nervous about going for his procedure tomorrow. He was told he will have to get his toe amputated.  Review of systems: Denies CP, SOB, n/v/d.   Objective: Vital Signs: Blood pressure 99/60, pulse 73, temperature 98.2 F (36.8 C), temperature source Oral, resp. rate 16, height  (1.905 m), weight 102.059 kg (225 lb), SpO2 97 %. No results found. Results for orders placed or performed during the hospital encounter of 05/10/15 (from the past 72 hour(s))  Protime-INR     Status: Abnormal   Collection Time: 05/14/15  4:30 AM  Result Value Ref Range   Prothrombin Time 23.4 (H) 11.6 - 15.2 seconds   INR 2.10 (H) 0.00 - 1.49  Protime-INR     Status: Abnormal   Collection Time: 05/15/15  5:05 AM  Result Value Ref Range   Prothrombin Time 25.0 (H) 11.6 - 15.2 seconds   INR 2.29 (H) 0.00 - 1.49  Protime-INR     Status: Abnormal   Collection Time: 05/16/15  5:00 AM  Result Value Ref Range   Prothrombin Time 26.6 (H) 11.6 - 15.2 seconds   INR 2.49 (H) 0.00 - 1.49     HEENT: Normocephalic, atraumatic. Cardio: RRR and no murmur Resp: CTA B/L and unlabored GI: BS positive and NT, ND . Skin:   Breakdown Right foot with erythema and small areas of black eschar on great toe .  Left wrist has mild erythema dorsal ulnar aspect, also has edema of the fingers. Neuro: Alert/Oriented, Anxious, Abnormal Sensation in left fingers and RLE Musc/Skel:  Edema of LUE Tenderness along LUE Antigravity strength throughout Gen: NAD. Vital signs reviewed.   Assessment/Plan: 1. 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 Physiatrist is providing close team  supervision and 24 hour management of active medical problems listed below. Physiatrist and rehab team continue to assess barriers to discharge/monitor patient progress toward functional and medical goals.  FIM: Function - Bathing Position: Shower Body parts bathed by patient: Right arm, Left arm, Chest, Abdomen, Front perineal area, Buttocks, Right upper leg, Left upper leg, Left lower leg, Right lower leg Body parts bathed by helper: Back, Right lower leg Assist Level: Supervision or verbal cues  Function- Upper Body Dressing/Undressing What is the patient wearing?: Pull over shirt/dress Pull over shirt/dress - Perfomed by patient: Thread/unthread right sleeve, Thread/unthread left sleeve, Put head through opening, Pull shirt over trunk Pull over shirt/dress - Perfomed by helper: Put head through opening Assist Level: Set up Set up : To obtain clothing/put away Function - Lower Body Dressing/Undressing What is the patient wearing?: Pants, Non-skid slipper socks Position: Wheelchair/chair at sink Pants- Performed by patient: Thread/unthread right pants leg, Thread/unthread left pants leg, Pull pants up/down, Fasten/unfasten pants Pants- Performed by helper: Thread/unthread right pants leg Non-skid slipper socks- Performed by patient: Don/doff left sock Non-skid slipper socks- Performed by helper: Don/doff right sock Assist for footwear: Dependant Assist for lower body dressing: Touching or steadying assistance (Pt > 75%)  Function - Toileting Toileting activity did not occur: N/A Toileting steps completed by patient: Adjust clothing prior to toileting Toileting steps completed by helper: Adjust clothing prior to toileting, Performs perineal hygiene, Adjust clothing after toileting Toileting Assistive Devices: Grab bar or rail Assist level:  Supervision or verbal cues  Function - Toilet Transfers Toilet transfer assistive device: Elevated toilet seat/BSC over toilet Assist level to  toilet: Touching or steadying assistance (Pt > 75%) Assist level from toilet: Touching or steadying assistance (Pt > 75%) Assist level to bedside commode (at bedside): Touching or steadying assistance (Pt > 75%) Assist level from bedside commode (at bedside): Touching or steadying assistance (Pt > 75%)  Function - Chair/bed transfer Chair/bed transfer method: Squat pivot, Stand pivot Chair/bed transfer assist level: Supervision or verbal cues Chair/bed transfer assistive device: Walker Chair/bed transfer details: Verbal cues for safe use of DME/AE, Verbal cues for precautions/safety  Function - Locomotion: Wheelchair Will patient use wheelchair at discharge?: Yes Type: Manual Max wheelchair distance: 185' Assist Level: Supervision or verbal cues Assist Level: Supervision or verbal cues Wheel 150 feet activity did not occur: Safety/medical concerns Assist Level: Supervision or verbal cues Turns around,maneuvers to table,bed, and toilet,negotiates 3% grade,maneuvers on rugs and over doorsills: No Function - Locomotion: Ambulation Assistive device: Walker-platform Max distance: 74' with 2 turns Assist level: Touching or steadying assistance (Pt > 75%) Assist level: Touching or steadying assistance (Pt > 75%) Walk 50 feet with 2 turns activity did not occur: Safety/medical concerns Assist level: Touching or steadying assistance (Pt > 75%) Walk 150 feet activity did not occur: Safety/medical concerns Walk 10 feet on uneven surfaces activity did not occur: Safety/medical concerns  Function - Comprehension Comprehension: Auditory Comprehension assist level: Follows complex conversation/direction with extra time/assistive device  Function - Expression Expression: Verbal Expression assist level: Expresses complex ideas: With extra time/assistive device  Function - Social Interaction Social Interaction assist level: Interacts appropriately with others with medication or extra time  (anti-anxiety, antidepressant).  Function - Problem Solving Problem solving assist level: Solves basic problems with no assist  Function - Memory Memory assist level: Recognizes or recalls 90% of the time/requires cueing < 10% of the time Patient normally able to recall (first 3 days only): Current season, Location of own room, Staff names and faces, That he or she is in a hospital  Medical Problem List and Plan: 1. 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  DVT Prophylaxis/Anticoagulation: Coumadin for thromboembolism. Monitor for any bleeding episodes 3. Pain Management: MS Contin 90 mg twice a day, oxycodone for breakthrough pain. Monitor with increased mobility, Developing increased nerve pain in RLE, Start low-dose gabapentin, titrate upward 4. Mood: Effexor XR 75 mg daily, Klonopin 0.5 mg 4 times a day as needed 5. Neuropsych: This patient is capable of making decisions on his own behalf. 6. Skin/Wound Care: Skin care is supervised to right foot. 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, still in process of demarcation 7. Fluids/Electrolytes/Nutrition: Strict I and O's with follow-up chemistries 8.ID. Presently on Ancef 2 g 3 times a day. ID rec total 12 wks, 9. Tachycardia SVT. Follow-up cardiology services. Continue Lopressor 50 mg twice a day for now, HR 73 this am 10. Constipation. Laxitive  assistance, per pt doing well, large BM this morning, continent 11.  Severe protein malnutrition, alb 1.6- supplement with beneprotein, enc hi protein diet choices 12.  Urinary retention-voiding well 13. Left wrist pain. History of distal radial fracture. X-rays indicate collapse of joint space over the last 6 weeks or so. Per surgery, does not  appear to be  an acutely septic joint. Appreciatehand surgery review of MRI. Will need at least 6 weeks of antibiotics, question broaden coverage. Infectious disease rec same abx for addnl 8 weeks per Dr Izora Ribasoley.  MRI quality adequate to judge need for addnl surgery ( no surgery indicated)   LOS (Days) 6 A FACE TO FACE EVALUATION WAS PERFORMED  Tomoko Sandra Karis JubaAnil Cordel Drewes 05/16/2015, 2:20 PM

## 2015-05-16 NOTE — Progress Notes (Addendum)
ANTICOAGULATION CONSULT NOTE - Follow Up Consult  Pharmacy Consult for Coumadin Indication: DVT    Labs:  Recent Labs  05/14/15 0430 05/15/15 0505 05/16/15 0500  LABPROT 23.4* 25.0* 26.6*  INR 2.10* 2.29* 2.49*     Assessment: 59 y/o M with prolonged hospital stay for MSSA bacteremia and septic thrombophlebitis. He has been on coumadin anticoagulation for R foot DVT since 9/28. INR remains therapeutic, 2.49 today. last CBC done on 10/17: Hgb 8.1, plt 329K, stable. No bleeding noted per chart. Ortho plans surgery 10/21 tomorrow @14 :50 for great toe and second toe amputation with possible amputation of the metatarsal heads. No orders given to hold coumadin yet. I spoke to Consolidated EdisonDan Angiulli, PA-C. They have a call out to Dr. Lajoyce Cornersuda to get preop orders.     Goal of Therapy:  INR 2-3 Monitor platelets by anticoagulation protocol: Yes   Plan:  - Coumadin 5mg  today , every Mon, Thur and 7.5 mg po qTWFSS unless held by Dr. Lajoyce Cornersuda or Dr. Wynn BankerKirsteins - Daily INR while inpatient  [name], RPh Clinical Pharmacist Pager: [phone] 05/16/2015    ADDENDUM:  Craig LemmingsWhitney, RN reports that Orthopedic surgeon did not give any orders regarding the coumadin tonight with surgery planned tomorrow, but that [name] told her to hold tonight's coumadin dose.   Plan:  Hold coumadin tonight INR qAM F/u post surgery tomorrow.  [name], RPh Clinical Pharmacist Pager: [phone] 05/16/2015 3:46 PM

## 2015-05-17 ENCOUNTER — Inpatient Hospital Stay (HOSPITAL_COMMUNITY): Payer: Medicaid Other | Admitting: Anesthesiology

## 2015-05-17 ENCOUNTER — Inpatient Hospital Stay (HOSPITAL_COMMUNITY): Payer: Medicaid Other | Admitting: Physical Therapy

## 2015-05-17 ENCOUNTER — Inpatient Hospital Stay (HOSPITAL_COMMUNITY): Payer: Medicaid Other

## 2015-05-17 ENCOUNTER — Inpatient Hospital Stay (HOSPITAL_COMMUNITY): Admission: RE | Admit: 2015-05-17 | Payer: Medicaid Other | Source: Ambulatory Visit | Admitting: Orthopedic Surgery

## 2015-05-17 ENCOUNTER — Encounter (HOSPITAL_COMMUNITY)
Admission: RE | Disposition: A | Discharge status: Home Health | Payer: Self-pay | Source: Intra-hospital | Attending: Physical Medicine & Rehabilitation

## 2015-05-17 HISTORY — PX: AMPUTATION: SHX166

## 2015-05-17 LAB — PROTIME-INR
INR: 2.42 — ABNORMAL HIGH (ref 0.00–1.49)
PROTHROMBIN TIME: 26 s — AB (ref 11.6–15.2)

## 2015-05-17 SURGERY — AMPUTATION DIGIT
Anesthesia: General | Site: Foot | Laterality: Right

## 2015-05-17 MED ORDER — PROPOFOL 10 MG/ML IV BOLUS
INTRAVENOUS | Status: DC | PRN
  Administered 2015-05-17: 20 mg via INTRAVENOUS
  Administered 2015-05-17: 40 mg via INTRAVENOUS

## 2015-05-17 MED ORDER — SODIUM CHLORIDE 0.9 % IV SOLN: INTRAVENOUS | Status: DC

## 2015-05-17 MED ORDER — KETAMINE HCL 10 MG/ML IJ SOLN
INTRAMUSCULAR | Status: DC | PRN
  Administered 2015-05-17 (×2): 20 mg via INTRAVENOUS
  Administered 2015-05-17: 40 mg via INTRAVENOUS

## 2015-05-17 MED ORDER — METHOCARBAMOL 500 MG PO TABS
ORAL_TABLET | ORAL | Status: AC
  Administered 2015-05-17: 500 mg via ORAL
  Filled 2015-05-17: qty 1

## 2015-05-17 MED ORDER — METOCLOPRAMIDE HCL 5 MG/ML IJ SOLN: 5.0000 mg | Freq: Three times a day (TID) | INTRAMUSCULAR | Status: DC | PRN

## 2015-05-17 MED ORDER — ONDANSETRON HCL 4 MG PO TABS: 4.0000 mg | ORAL_TABLET | Freq: Four times a day (QID) | ORAL | Status: DC | PRN

## 2015-05-17 MED ORDER — METOCLOPRAMIDE HCL 5 MG PO TABS: 5.0000 mg | ORAL_TABLET | Freq: Three times a day (TID) | ORAL | Status: DC | PRN

## 2015-05-17 MED ORDER — CEFAZOLIN SODIUM 1-5 GM-% IV SOLN
1.0000 g | Freq: Four times a day (QID) | INTRAVENOUS | Status: DC
  Filled 2015-05-17 (×2): qty 50

## 2015-05-17 MED ORDER — OXYCODONE HCL 5 MG PO TABS
ORAL_TABLET | ORAL | Status: AC
  Administered 2015-05-17: 30 mg via ORAL
  Filled 2015-05-17: qty 6

## 2015-05-17 MED ORDER — CEFAZOLIN SODIUM-DEXTROSE 2-3 GM-% IV SOLR
2.0000 g | Freq: Three times a day (TID) | INTRAVENOUS | Status: DC
  Administered 2015-05-17 – 2015-05-25 (×23): 2 g via INTRAVENOUS
  Filled 2015-05-17 (×25): qty 50

## 2015-05-17 MED ORDER — ONDANSETRON HCL 4 MG/2ML IJ SOLN
INTRAMUSCULAR | Status: DC | PRN
  Administered 2015-05-17: 4 mg via INTRAVENOUS

## 2015-05-17 MED ORDER — 0.9 % SODIUM CHLORIDE (POUR BTL) OPTIME
TOPICAL | Status: DC | PRN
  Administered 2015-05-17: 1000 mL

## 2015-05-17 MED ORDER — HYDROMORPHONE HCL 1 MG/ML IJ SOLN
INTRAMUSCULAR | Status: AC
  Administered 2015-05-17: 0.5 mg via INTRAVENOUS
  Filled 2015-05-17: qty 2

## 2015-05-17 MED ORDER — ACETAMINOPHEN 650 MG RE SUPP: 650.0000 mg | Freq: Four times a day (QID) | RECTAL | Status: DC | PRN

## 2015-05-17 MED ORDER — HYDROMORPHONE HCL 1 MG/ML IJ SOLN
0.5000 mg | INTRAMUSCULAR | Status: AC | PRN
  Administered 2015-05-17 (×4): 0.5 mg via INTRAVENOUS

## 2015-05-17 MED ORDER — FENTANYL CITRATE (PF) 100 MCG/2ML IJ SOLN
INTRAMUSCULAR | Status: AC
  Administered 2015-05-17: 50 ug via INTRAVENOUS
  Filled 2015-05-17: qty 2

## 2015-05-17 MED ORDER — LIDOCAINE-EPINEPHRINE (PF) 1.5 %-1:200000 IJ SOLN
INTRAMUSCULAR | Status: DC | PRN
  Administered 2015-05-17: 30 mL via PERINEURAL

## 2015-05-17 MED ORDER — METHOCARBAMOL 1000 MG/10ML IJ SOLN
500.0000 mg | Freq: Four times a day (QID) | INTRAVENOUS | Status: DC | PRN
  Filled 2015-05-17: qty 5

## 2015-05-17 MED ORDER — MIDAZOLAM HCL 2 MG/2ML IJ SOLN
INTRAMUSCULAR | Status: AC
  Administered 2015-05-17: 2 mg via INTRAVENOUS
  Filled 2015-05-17: qty 2

## 2015-05-17 MED ORDER — ONDANSETRON HCL 4 MG/2ML IJ SOLN
4.0000 mg | Freq: Four times a day (QID) | INTRAMUSCULAR | Status: DC | PRN
  Administered 2015-05-18: 4 mg via INTRAVENOUS
  Filled 2015-05-17: qty 2

## 2015-05-17 MED ORDER — MIDAZOLAM HCL 2 MG/2ML IJ SOLN
1.0000 mg | INTRAMUSCULAR | Status: DC | PRN
Start: 2015-05-17 — End: 2015-05-17
  Administered 2015-05-17: 2 mg via INTRAVENOUS

## 2015-05-17 MED ORDER — ACETAMINOPHEN 325 MG PO TABS: 650.0000 mg | ORAL_TABLET | Freq: Four times a day (QID) | ORAL | Status: DC | PRN

## 2015-05-17 MED ORDER — FENTANYL CITRATE (PF) 100 MCG/2ML IJ SOLN
50.0000 ug | INTRAMUSCULAR | Status: DC | PRN
  Administered 2015-05-17: 50 ug via INTRAVENOUS

## 2015-05-17 MED ORDER — PROPOFOL 500 MG/50ML IV EMUL
INTRAVENOUS | Status: DC | PRN
  Administered 2015-05-17: 50 ug/kg/min via INTRAVENOUS

## 2015-05-17 MED ORDER — BUPIVACAINE-EPINEPHRINE (PF) 0.5% -1:200000 IJ SOLN
INTRAMUSCULAR | Status: DC | PRN
  Administered 2015-05-17: 30 mL via PERINEURAL

## 2015-05-17 MED ORDER — LACTATED RINGERS IV SOLN
INTRAVENOUS | Status: DC
  Administered 2015-05-17 (×2): via INTRAVENOUS

## 2015-05-17 MED ORDER — METHOCARBAMOL 500 MG PO TABS
500.0000 mg | ORAL_TABLET | Freq: Four times a day (QID) | ORAL | Status: DC | PRN
  Administered 2015-05-17 – 2015-05-24 (×10): 500 mg via ORAL
  Filled 2015-05-17 (×10): qty 1

## 2015-05-17 MED ORDER — KETAMINE HCL 100 MG/ML IJ SOLN
INTRAMUSCULAR | Status: AC
  Filled 2015-05-17: qty 1

## 2015-05-17 SURGICAL SUPPLY — 30 items
BLADE SURG 21 STRL SS (BLADE) ×3 IMPLANT
BNDG CMPR 9X4 STRL LF SNTH (GAUZE/BANDAGES/DRESSINGS)
BNDG COHESIVE 4X5 TAN STRL (GAUZE/BANDAGES/DRESSINGS) ×3 IMPLANT
BNDG ESMARK 4X9 LF (GAUZE/BANDAGES/DRESSINGS) IMPLANT
BNDG GAUZE ELAST 4 BULKY (GAUZE/BANDAGES/DRESSINGS) ×3 IMPLANT
COVER SURGICAL LIGHT HANDLE (MISCELLANEOUS) ×3 IMPLANT
DRAPE U-SHAPE 47X51 STRL (DRAPES) ×3 IMPLANT
DRSG ADAPTIC 3X8 NADH LF (GAUZE/BANDAGES/DRESSINGS) ×3 IMPLANT
DRSG PAD ABDOMINAL 8X10 ST (GAUZE/BANDAGES/DRESSINGS) ×3 IMPLANT
DURAPREP 26ML APPLICATOR (WOUND CARE) ×3 IMPLANT
ELECT REM PT RETURN 9FT ADLT (ELECTROSURGICAL) ×3
ELECTRODE REM PT RTRN 9FT ADLT (ELECTROSURGICAL) ×1 IMPLANT
GAUZE SPONGE 4X4 12PLY STRL (GAUZE/BANDAGES/DRESSINGS) ×2 IMPLANT
GLOVE BIOGEL PI IND STRL 9 (GLOVE) ×1 IMPLANT
GLOVE BIOGEL PI INDICATOR 9 (GLOVE) ×2
GLOVE SURG ORTHO 9.0 STRL STRW (GLOVE) ×3 IMPLANT
GOWN STRL REUS W/ TWL XL LVL3 (GOWN DISPOSABLE) ×2 IMPLANT
GOWN STRL REUS W/TWL XL LVL3 (GOWN DISPOSABLE) ×6
KIT BASIN OR (CUSTOM PROCEDURE TRAY) ×3 IMPLANT
KIT ROOM TURNOVER OR (KITS) ×3 IMPLANT
NEEDLE 22X1 1/2 (OR ONLY) (NEEDLE) IMPLANT
NS IRRIG 1000ML POUR BTL (IV SOLUTION) ×3 IMPLANT
PACK ORTHO EXTREMITY (CUSTOM PROCEDURE TRAY) ×3 IMPLANT
PAD ARMBOARD 7.5X6 YLW CONV (MISCELLANEOUS) ×4 IMPLANT
SUCTION FRAZIER TIP 10 FR DISP (SUCTIONS) IMPLANT
SUT ETHILON 2 0 PSLX (SUTURE) ×5 IMPLANT
SYR BULB IRRIGATION 50ML (SYRINGE) ×2 IMPLANT
SYR CONTROL 10ML LL (SYRINGE) IMPLANT
TOWEL OR 17X24 6PK STRL BLUE (TOWEL DISPOSABLE) ×3 IMPLANT
TOWEL OR 17X26 10 PK STRL BLUE (TOWEL DISPOSABLE) ×3 IMPLANT

## 2015-05-17 NOTE — Transfer of Care (Signed)
Immediate Anesthesia Transfer of Care Note  Patient: [name]  Procedure(s) Performed: Procedure(s): Right 1st and 2nd Toe Amputation (Right)  Patient Location: PACU  Anesthesia Type:MAC  Level of Consciousness: awake, alert , oriented and patient cooperative  Airway & Oxygen Therapy: Patient Spontanous Breathing  Post-op Assessment: Report given to RN, Post -op Vital signs reviewed and stable and Patient moving all extremities  Post vital signs: Reviewed and stable  Last Vitals:  Filed Vitals:   05/17/15 1400  BP: 111/64  Pulse: 76  Temp:   Resp: 10    Complications: No apparent anesthesia complications

## 2015-05-17 NOTE — Interval H&P Note (Signed)
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 After consideration of risks, benefits and other options for treatment, the patient has consented to  Procedure(s): Right 1st and 2nd Toe Amputation (Right) as a surgical intervention .  The patient's history has been reviewed, patient examined, no change in status, stable for surgery.  I have reviewed the patient's chart and labs.  Questions were answered to the patient's satisfaction.     [name]

## 2015-05-17 NOTE — Progress Notes (Signed)
Called report to OR nurse in preparation for patient to go to surgery.  Awaiting transport.  Dani Gobbleeardon, Rhythm Wigfall J, RN

## 2015-05-17 NOTE — H&P (View-Only) (Signed)
Patient ID: [name], male   DOB: 02/09/1956, 59 y.o.   MRN: [id number] The dry gangrene involving the majority of patient's right foot has resolved quite nicely. The dry gangrenous changes at the base of the first and second toes has developed into wet gangrene with exposure of the base of the first and second toe. At this time patient will require a great toe and second toe amputation with possible amputation of the metatarsal heads. We will plan for surgery Friday afternoon nothing by mouth after midnight Thursday. 

## 2015-05-17 NOTE — Anesthesia Postprocedure Evaluation (Signed)
Anesthesia Post Note  Patient: [name]  Procedure(s) Performed: Procedure(s) (LRB): Right 1st and 2nd Toe Amputation (Right)  Anesthesia type: regional  Patient location: PACU  Post pain: Pain level controlled  Post assessment: Patient's Cardiovascular Status Stable  Last Vitals:  Filed Vitals:   05/17/15 1632  BP: 108/64  Pulse: 75  Temp: 36.9 C  Resp: 16    Post vital signs: Reviewed and stable  Level of consciousness: awake  Complications: No apparent anesthesia complications

## 2015-05-17 NOTE — Progress Notes (Signed)
Physical Therapy Session Note  Patient Details  Name: Craig MunchJimmy F [name] MRN: [id number] Date of Birth: [birth year]  Today's Date: 05/17/2015 PT Individual Time: 0930-1030 PT Individual Time Calculation (min): 60 min   Short Term Goals: Week 1:  PT Short Term Goal 1 (Week 1): Pt will initiate ambulation during PT session.  PT Short Term Goal 2 (Week 1): Pt will tolerate sitting up in w/c oob x 4 hours.  PT Short Term Goal 3 (Week 1): Pt will initiate stand-pivot bed to/from w/c transfers.  PT Short Term Goal 4 (Week 1): Pt will initiate stair training during PT session.  PT Short Term Goal 5 (Week 1): Pt will demonstrate weight bearing through R heel during stand-pivot transfers in order to increase functional use of the extremity.   Skilled Therapeutic Interventions/Progress Updates:    Pt received up in w/c - NPO since last night, but agreeable to "light" PT. W/C Management - PT observes R footplate come unattached from legrest - PT fixes this once in gym and has access to toolbox. See function tab for details - pt takes increased time and self propels with R UE and L LE (shoe on) x 175' mod I in a controlled environment. Therapeutic Exercise: 1# ankle weight to R ankle and 4# ankle weight to L ankle: heel slides, isometric knee extension with sheet looped around midfoot x 5 second holds, bridges with PT stabilizing R LE placement, side lie hip abduction with verbal & tactile cues for technique - 1# weight removed from R LE for 2nd rep: 2 x 10 reps each. Therapeutic Activity - see function tab for details transferring in/out of w/c and bed/mat mobility. Pt able to scoot/squat-pivot transfer level surface with close Supervision, but req min A when slightly uphill. Pt ended in bed with all needs in reach. Continue per PT POC.   Therapy Documentation Precautions:  Precautions Precautions: Fall Precaution Comments: no specific weight bearing order but avoided weight bearing through L wrist; wound to  right LE and groin Restrictions Weight Bearing Restrictions: Yes RLE Weight Bearing: Non weight bearing Other Position/Activity Restrictions: R wrist Pain: Pain Assessment Pain Assessment: 0-10 Pain Score: 8  Pain Type: Chronic pain Pain Location: Leg Pain Orientation: Right Pain Descriptors / Indicators: Sharp Pain Frequency: Constant Pain Onset: On-going Patients Stated Pain Goal: 4 Pain Intervention(s): Rest;Emotional support Multiple Pain Sites: Yes 2nd Pain Site Pain Score: 8 Pain Type: Acute pain Pain Location: Hand Pain Orientation: Left Pain Descriptors / Indicators: Sharp Pain Onset: On-going Pain Intervention(s): Rest;Emotional support   See Function Navigator for Current Functional Status.   Therapy/Group: Individual Therapy  Alistair Senft M 05/17/2015, 9:50 AM

## 2015-05-17 NOTE — Progress Notes (Signed)
Occupational Therapy Weekly Progress Note  Patient Details  Name: [name] MRN: [id number] Date of Birth: 1955/09/21  Beginning of progress report period: May 11, 2015 End of progress report period: May 17, 2015  Today's Date: 05/17/2015 OT Individual Time: 0768-0881 OT Individual Time Calculation (min): 60 min   Patient has met 5 of 5 short term goals.  Pt continues to demonstrate improved performance with self-care complicated by wound/orthopedic care precautions.  Pt now using AE effectively but requires assist with managing clothing over right foot.  Patient continues to demonstrate the following deficits: Pain management, impaired general endurance, difficulty with adherence to orthopedic precautions  and therefore will continue to benefit from skilled OT intervention to enhance overall performance with BADL.  Patient progressing toward long term goals..  Continue plan of care.  OT Short Term Goals Week 1:  OT Short Term Goal 1 (Week 1): Pt will complete stand pivot transfer to [facility] with supervision OT Short Term Goal 1 - Progress (Week 1): Met OT Short Term Goal 2 (Week 1): Pt will dress don underwear/ pants with steadying assist.  OT Short Term Goal 2 - Progress (Week 1): Met OT Short Term Goal 3 (Week 1): Pt will complete all grooming tasks from seated level mod I OT Short Term Goal 3 - Progress (Week 1): Met OT Short Term Goal 4 (Week 1): Pt will demonstrate carryover of education of self-care skills with min questioning cues OT Short Term Goal 4 - Progress (Week 1): Met OT Short Term Goal 5 - Progress (Week 1): Met Week 2:  OT Short Term Goal 1 (Week 2): Pt will complete bathing with setup assist OT Short Term Goal 2 (Week 2): Pt will dress upper body iindependently seated with extra time OT Short Term Goal 3 (Week 2): Pt will don pants using reacher with vc to problem solve OT Short Term Goal 4 (Week 2): Pt will retrieve food from refrigerator using least  restrictive adaptive device while maintaining precautions 100% of the time with supervision.  Skilled Therapeutic Interventions/Progress Updates: ADL-retraining with focus on bed mobility, lower body dressing skills using AE, and orthotics management.   Pt completes self-feeding unassisted, bed mobility with supervision and setup to place platform walker, and steadying assist.  Pt requires cues to insure brakes are locked prior to transfers.   Pt requests assist with managing orthotics for left wrist due to concern for weight-bearing through wrist during bathing, using grab bars, and potential fall risk.   OT initiates fitting of custom thermoplastic orthotic intended for use in shower.   Pt reports satisfaction with fit and form; plan to initiate trial use during performance of BADL on 05/20/15.     Therapy Documentation Precautions:  Precautions Precautions: Fall Precaution Comments: no specific weight bearing order but avoided weight bearing through L wrist; wound to right LE and groin Restrictions Weight Bearing Restrictions: Yes RLE Weight Bearing: Non weight bearing Other Position/Activity Restrictions: R wrist  Pain: Pain Assessment Pain Assessment: 0-10 Pain Score: 6  Pain Type: Chronic pain Pain Location: Leg Pain Orientation: Right Pain Descriptors / Indicators: Sharp Pain Onset: On-going Pain Intervention(s): Rest;Emotional support Multiple Pain Sites: Yes 2nd Pain Site Pain Score: 8 Pain Type: Acute pain Pain Location: Hand Pain Orientation: Left Pain Descriptors / Indicators: Sharp Pain Onset: On-going Pain Intervention(s): Rest;Emotional support  See Function Navigator for Current Functional Status.   Therapy/Group: Individual Therapy  Niquan Charnley 05/18/2015, 8:00 AM

## 2015-05-17 NOTE — Progress Notes (Signed)
Social Work Patient ID: [name], male   DOB: December 27, 1955, 59 y.o.   MRN: [id number]   CSW met with pt on 05-16-15 to update him on team conference discussion.  By that time, he had learned that he would be having 2 toes amputated.  CSW explained that he would be able to return to rehab unit after surgery and not have to stay on acute and that therapy team will need to revisit pt's d/c date.  Pt expressed understanding.  Pt also said that he and his wife were coping okay with new surgery and that he encouraged her to stay home until day of surgery and rest and take care of their 49 y/o grandson.  Pt's family is to be working on ramp at home.  CSW will continue to follow and assist as needed - f/u therapies/care; DME; IV antibiotics; etc.

## 2015-05-17 NOTE — Anesthesia Preprocedure Evaluation (Addendum)
Anesthesia Evaluation  Patient identified by MRN, date of birth, ID band Patient awake    Reviewed: Allergy & Precautions, NPO status , Patient's Chart, lab work & pertinent test results  Airway Mallampati: I  TM Distance: >3 FB Neck ROM: Full    Dental   Pulmonary former smoker,    Pulmonary exam normal        Cardiovascular hypertension, Pt. on medications + Peripheral Vascular Disease  Normal cardiovascular exam     Neuro/Psych Anxiety    GI/Hepatic GERD  Medicated and Controlled,  Endo/Other    Renal/GU      Musculoskeletal   Abdominal   Peds  Hematology   Anesthesia Other Findings   Reproductive/Obstetrics                            Anesthesia Physical Anesthesia Plan  ASA: III  Anesthesia Plan: Regional   Post-op Pain Management:    Induction: Intravenous  Airway Management Planned: Natural Airway  Additional Equipment:   Intra-op Plan:   Post-operative Plan:   Informed Consent: I have reviewed the patients History and Physical, chart, labs and discussed the procedure including the risks, benefits and alternatives for the proposed anesthesia with the patient or authorized representative who has indicated his/her understanding and acceptance.     Plan Discussed with: CRNA and Surgeon  Anesthesia Plan Comments:         Anesthesia Quick Evaluation

## 2015-05-17 NOTE — Anesthesia Procedure Notes (Signed)
Anesthesia Regional Block:  Popliteal block  Pre-Anesthetic Checklist: ,, timeout performed, Correct Patient, Correct Site, Correct Laterality, Correct Procedure, Correct Position, site marked, Risks and benefits discussed,  Surgical consent,  Pre-op evaluation,  At surgeon's request and post-op pain management  Laterality: Right  Prep: chloraprep       Needles:  Injection technique: Single-shot  Needle Type: Echogenic Stimulator Needle     Needle Length: 10cm 10 cm Needle Gauge: 21 and 21 G    Additional Needles:  Procedures: ultrasound guided (picture in chart) and nerve stimulator Popliteal block  Nerve Stimulator or Paresthesia:  Response: 0.4 mA,   Additional Responses:   Narrative:  Start time: 05/17/2015 1:45 PM End time: 05/17/2015 2:00 PM Injection made incrementally with aspirations every 5 mL.  Performed by: Personally  Anesthesiologist: Arta BruceSSEY, Hikaru Delorenzo  Additional Notes: Monitors applied. Patient sedated. Sterile prep and drape,hand hygiene and sterile gloves were used. Relevant anatomy identified.Needle position confirmed.Local anesthetic injected incrementally after negative aspiration. Local anesthetic spread visualized around nerve(s). Vascular puncture avoided. No complications. Image printed for medical record.The patient tolerated the procedure well.  Additional Saphenous nerve block performed. 15cc Local Anesthetic mixture placed under ultrasonic guidance along the medio-inferior border of the Sartorious muscle 6 inches above the knee.  No Problems encountered.  Arta BruceKevin Leliana Kontz MD

## 2015-05-17 NOTE — Progress Notes (Signed)
ANTICOAGULATION CONSULT NOTE - Follow Up Consult  Pharmacy Consult for Coumadin Indication: RLE DVT    Labs:  Recent Labs  05/15/15 0505 05/16/15 0500 05/17/15 0440  LABPROT 25.0* 26.6* 26.0*  INR 2.29* 2.49* 2.42*     Assessment: 59 y/o M with prolonged hospital stay for MSSA bacteremia and septic thrombophlebitis. He has been on coumadin anticoagulation for R foot DVT since 9/28. INR remains therapeutic at 2.42 today. Coumadin held yesterday due to plan for surgery today.  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  Deatra Inaan Angiulli, PA-C for Rehab told RN to hold coumadin today due to patient is post op surgery today.   Last CBC done on 10/17 Hgb 8.1, plt 329K, stable. No bleeding reported or noted per chart.  Goal of Therapy:  INR 2-3 Monitor platelets by anticoagulation protocol: Yes   Plan:  - Coumadin held today per Deatra Inaan Angiulli, PA-C Tomorrow plan to resume Coumadin 7.5 mg po qTWFSS and 5mg  qMon & Thursday.  - Daily INR while inpatient  Noah Delaineuth Arryana Tolleson, RPh Clinical Pharmacist Pager: [phone] 05/17/2015    ADDENDUM:  [name], RN reports that Orthopedic surgeon did not give any orders regarding the coumadin tonight with surgery planned tomorrow, but that [name] told her to hold tonight's coumadin dose.   Plan:  Hold coumadin tonight INR qAM F/u post surgery tomorrow.  Noah Delaineuth Panzy Bubeck, RPh Clinical Pharmacist Pager: [phone] 05/17/2015 5:59 PM

## 2015-05-17 NOTE — Patient Care Conference (Signed)
Inpatient RehabilitationTeam Conference and Plan of Care Update Date: 05/15/2015   Time: 2:10 PM    Patient Name: [name]      Medical Record Number: [id number]  Date of Birth: 1956/02/21 Sex: Male         Room/Bed: 4W08C/4W08C-01 Payor Info: Payor: MEDICAID [place] / Plan: MEDICAID [place] ACCESS / Product Type: *No Product type* /    Admitting Diagnosis: sepsis Gangrene Right Great Toe and 2nd Toe  Admit Date/Time:  05/10/2015  3:58 PM Admission Comments: No comment available   Primary Diagnosis:  Debility Principal Problem: Debility  Patient Active Problem List   Diagnosis Date Noted  . Mycotic aneurysm   . Thigh abscess   . Hand abscess   . Septic embolism (HCC)   . Gangrene of toe (HCC)   . Acute osteomyelitis of left ulna (HCC)   . Streptococcal arthritis of left wrist (HCC)   . Dry gangrene (HCC)   . Wound, open, foot with complication   . Acute deep vein thrombosis (DVT) of distal end of right lower extremity (HCC)   . Left wrist fracture   . Abscess of bursa, left wrist   . SVT (supraventricular tachycardia) (HCC) 05/08/2015  . 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 Adjustment disorder with depressed mood   . 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 Debility 05/01/2015  . Sepsis (HCC) 04/30/2015  . Acute renal failure with tubular necrosis (HCC)   . Abscess of left hand   . Staphylococcus aureus bacteremia with sepsis (HCC)   . Colles' fracture of left radius   . Cellulitis of left upper extremity   . Diastolic dysfunction   . Nondisplaced fracture of distal end of left radius   . Abscess   . Bacteremia due to Staphylococcus   . Pulmonary hypertension (HCC)   . Acute blood loss anemia   . Aneurysm of right femoral artery (HCC)   . DVT, lower extremity (HCC)   . Radius distal fracture   . Cellulitis of finger of left hand   . Acute pyelonephritis   . Arm pain, anterior   . Cellulitis   . Bacteremia due to Staphylococcus aureus   .  Acute renal failure syndrome (HCC)   . Fracture of left radius   . ARF (acute renal failure) (HCC) 04/07/2015  . Severe sepsis (HCC) 04/07/2015  . Cellulitis of left hand 04/07/2015  . Elevated INR 04/07/2015  . Foot swelling 04/07/2015  . Elevated bilirubin 04/07/2015  . Hypokalemia 04/07/2015  . Hypomagnesemia 04/07/2015  . 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 Bacteremia 04/07/2015  . UTI (lower urinary tract infection)   . Chest pain 11/15/2013  . History of duodenal ulcer 11/15/2013  . 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 Septic shock due to abscess 07/13/2013  . Chronic use of opiate drugs therapeutic purposes 07/12/2013  . Acute renal failure (HCC) 07/11/2013  . Chronic pain syndrome 07/11/2013  . Testosterone deficiency 07/11/2013    Expected Discharge Date: Expected Discharge Date: 05/21/15  Team Members Present: Physician leading conference: Dr. Maryla Morrow Social Worker Present: Staci Acosta, LCSW Nurse Present: Chana Bode, RN PT Present: Laurin Coder, PT OT Present: Roney Mans, OT PPS Coordinator present : Tora Duck, RN, CRRN     Current Status/Progress Goal Weekly Team Focus  Medical   Debility s/p sepsis with pain issues and hemotological abnormalities  Stabilize Hb, pain control  Pain management, improve ambulation with PRAFO   Bowel/Bladder   foley  for retention; continent of bowel; LBM 10/18  min assist for bowels;   Assess and treat for constipation as needed; review need for foley and begin voiding trial   Swallow/Nutrition/ Hydration             ADL's   Sup-Min A with transfers, Min A lower body dressing, setup for upper body BADL,   Mod I - Supervision, overall  Improved endurance, transfers, functional mobility, standing balance, AE re-ed, adherence to ortho precautions   Mobility   min A overall  Mod I overall transfers and w/c mobility, supervision gait - stair goal d/c as pt plans to get a ramp  LE strengthening & ROM, transfers,  gait, standing balance, activity tolerance/endurance   Communication             Safety/Cognition/ Behavioral Observations            Pain   Pain 6-9/10- MS contin scheduled, oxycodone 30mg  po scheduled; oxy IR as needed; added neurontin- seems to be helping.  < 5  Assess and treat for pain q shift and prn   Skin   Multiple wounds; right thigh- dressing changes QOD; right foot 3 necrotic toes on underside; microguard powder to scrotum; abdominal wound OTA  No further breakdown or infection with mod assist  Assess skin q shift and prn; perform dressing changes per MD order and note any changes in wound appearance    Rehab Goals Patient on target to meet rehab goals: Yes Rehab Goals Revised: none *See Care Plan and progress notes for long and short-term goals.  Barriers to Discharge: Pain and anemia    Possible Resolutions to Barriers:  Stabalize Hb, pain management, PRAFO    Discharge Planning/Teaching Needs:  Pt to return to his home where his wife and two adult sons live and they will be with him 24/7.  Pt's wife is here often and can attend family education when appropriate.   Team Discussion:  Pt is progressing well and doing better than he was prior to returning to acute.  PT wants pt to have a ramp built and he stated his family can work on that.  Pt will need a left platform for his own rolling walker.  OT still has mod I goals, but they question if he will meet these.  Pt may need some min assist for lower body dressing.  PT plans to get a PRAFO for pt.  Pt will continue to need IV antibiotics at home.  MD checking with Dr. Lajoyce Cornersuda to see what the plan is for pt's right foot re: amputation or not.  If they are not doing surgery, pt will be ready for d/c on 05-21-15.  If surgery, team will revisit d/c date.  Revisions to Treatment Plan:  Pending decision about surgery.   Continued Need for Acute Rehabilitation Level of Care: The patient requires daily medical management by a  physician with specialized training in physical medicine and rehabilitation for the following conditions: Daily direction of a multidisciplinary physical rehabilitation program to ensure safe treatment while eliciting the highest outcome that is of practical value to the patient.: Yes Daily medical management of patient stability for increased activity during participation in an intensive rehabilitation regime.: Yes Daily analysis of laboratory values and/or radiology reports with any subsequent need for medication adjustment of medical intervention for : Post surgical problems  Vansh Reckart, Vista DeckJennifer Capps 05/17/2015, 10:15 PM

## 2015-05-17 NOTE — Progress Notes (Signed)
Subjective/Complaints: Patient states she slept okay last night. He is a little nervous about his surgery today and the coordination of therapies as a result.  Review of systems: Denies CP, SOB, n/v/d.   Objective: Vital Signs: Blood pressure 98/69, pulse 80, temperature 98.6 F (37 C), temperature source Oral, resp. rate 16, height  (1.905 m), weight 102.059 kg (225 lb), SpO2 99 %. No results found. Results for orders placed or performed during the hospital encounter of 05/10/15 (from the past 72 hour(s))  Protime-INR     Status: Abnormal   Collection Time: 05/15/15  5:05 AM  Result Value Ref Range   Prothrombin Time 25.0 (H) 11.6 - 15.2 seconds   INR 2.29 (H) 0.00 - 1.49  Protime-INR     Status: Abnormal   Collection Time: 05/16/15  5:00 AM  Result Value Ref Range   Prothrombin Time 26.6 (H) 11.6 - 15.2 seconds   INR 2.49 (H) 0.00 - 1.49  Protime-INR     Status: Abnormal   Collection Time: 05/17/15  4:40 AM  Result Value Ref Range   Prothrombin Time 26.0 (H) 11.6 - 15.2 seconds   INR 2.42 (H) 0.00 - 1.49     HEENT: Normocephalic, atraumatic. Cardio: RRR and no murmur Resp: CTA B/L and unlabored GI: BS positive and NT, ND . Skin:   Breakdown Right foot with erythema and small areas of black eschar on great toe .  Left wrist has mild erythema dorsal ulnar aspect, also has edema of the fingers. Neuro: Alert/Oriented, Anxious, Abnormal Sensation in left fingers and RLE Musc/Skel:  Edema of LUE Tenderness along LUE Antigravity strength throughout Gen: NAD. Vital signs reviewed.   Assessment/Plan: 1. 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 Physiatrist is providing close team supervision and 24 hour management of  active medical problems listed below. Physiatrist and rehab team continue to assess barriers to discharge/monitor patient progress toward functional and medical goals.  FIM: Function - Bathing Position: Shower Body parts bathed by patient: Right arm, Left arm, Chest, Abdomen, Front perineal area, Buttocks, Right upper leg, Left upper leg, Left lower leg, Right lower leg Body parts bathed by helper: Back, Right lower leg Assist Level: Supervision or verbal cues  Function- Upper Body Dressing/Undressing What is the patient wearing?: Pull over shirt/dress Pull over shirt/dress - Perfomed by patient: Thread/unthread right sleeve, Thread/unthread left sleeve, Put head through opening, Pull shirt over trunk Pull over shirt/dress - Perfomed by helper: Put head through opening Assist Level: Set up Set up : To obtain clothing/put away Function - Lower Body Dressing/Undressing What is the patient wearing?: Pants, Non-skid slipper socks Position: Wheelchair/chair at sink Pants- Performed by patient: Thread/unthread right pants leg, Thread/unthread left pants leg, Pull pants up/down, Fasten/unfasten pants Pants- Performed by helper: Thread/unthread right pants leg Non-skid slipper socks- Performed by patient: Don/doff left sock Non-skid slipper socks- Performed by helper: Don/doff right sock Assist for footwear: Dependant Assist for lower body dressing: Touching or steadying assistance (Pt > 75%)  Function - Toileting Toileting activity did not occur: N/A Toileting steps completed by patient: Adjust clothing prior to toileting Toileting steps completed by helper: Adjust clothing prior to toileting, Performs perineal hygiene, Adjust clothing after toileting Toileting Assistive Devices: Grab bar or rail Assist level: Supervision or verbal cues  Function - Toilet  Transfers Toilet transfer activity did not occur: N/A Toilet transfer assistive device: Elevated toilet seat/BSC over toilet Assist  level to toilet: Touching or steadying assistance (Pt > 75%) Assist level from toilet: Touching or steadying assistance (Pt > 75%) Assist level to bedside commode (at bedside): Touching or steadying assistance (Pt > 75%) Assist level from bedside commode (at bedside): Touching or steadying assistance (Pt > 75%)  Function - Chair/bed transfer Chair/bed transfer method: Lateral scoot Chair/bed transfer assist level: Touching or steadying assistance (Pt > 75%) (Supervision level tx; min A slightly uphill tx) Chair/bed transfer assistive device: Armrests Chair/bed transfer details: Verbal cues for technique (head-hips relationship)  Function - Locomotion: Wheelchair Will patient use wheelchair at discharge?: Yes Type: Manual Max wheelchair distance: 175' Assist Level: No help, No cues, assistive device, takes more than reasonable amount of time Assist Level: No help, No cues, assistive device, takes more than reasonable amount of time Wheel 150 feet activity did not occur: Safety/medical concerns Assist Level: No help, No cues, assistive device, takes more than reasonable amount of time Turns around,maneuvers to table,bed, and toilet,negotiates 3% grade,maneuvers on rugs and over doorsills:  (NT) Function - Locomotion: Ambulation Assistive device: Walker-platform Max distance: 9830' with 2 turns Assist level: Touching or steadying assistance (Pt > 75%) Assist level: Touching or steadying assistance (Pt > 75%) Walk 50 feet with 2 turns activity did not occur: Safety/medical concerns Assist level: Touching or steadying assistance (Pt > 75%) Walk 150 feet activity did not occur: Safety/medical concerns Walk 10 feet on uneven surfaces activity did not occur: Safety/medical concerns  Function - Comprehension Comprehension: Auditory Comprehension assist level: Follows complex conversation/direction with extra time/assistive device  Function - Expression Expression: Verbal Expression assist  level: Expresses complex ideas: With extra time/assistive device  Function - Social Interaction Social Interaction assist level: Interacts appropriately with others with medication or extra time (anti-anxiety, antidepressant).  Function - Problem Solving Problem solving assist level: Solves basic problems with no assist  Function - Memory Memory assist level: Recognizes or recalls 90% of the time/requires cueing < 10% of the time Patient normally able to recall (first 3 days only): Current season, Location of own room, Staff names and faces, That he or she is in a hospital  Medical Problem List and Plan: 1. Functional deficits secondary to debilitation/sepsis/multi-medical/MRSA abscesses with multiple irrigation and debridements with rectus flap procedure 04/19/2015-abd incsion healing well,  femoral neuropathy on right   - Plan for OR today at 3PM for RLE 2 digit amputation  -Will limit therapies today and reevaluate tomorrow 2.  DVT Prophylaxis/Anticoagulation: Coumadin for thromboembolism. Monitor for any bleeding episodes 3. Pain Management: MS Contin 90 mg twice a day, oxycodone for breakthrough pain. Monitor with increased mobility, Developing increased nerve pain in RLE, Start low-dose gabapentin, titrate upward 4. Mood: Effexor XR 75 mg daily, Klonopin 0.5 mg 4 times a day as needed 5. Neuropsych: This patient is capable of making decisions on his own behalf. 6. Skin/Wound Care: Skin care is supervised to right foot. 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 Lajoyce Cornersuda, still in process of demarcation 7. Fluids/Electrolytes/Nutrition: Strict I and O's with follow-up chemistries 8.ID. Presently on Ancef 2 g 3 times a day. ID rec total 12 wks, 9. Tachycardia SVT. Resolved.  Follow-up cardiology services. Continue Lopressor 50 mg twice a day for now, HR 80 this am 10. Constipation. Laxitive  assistance, per pt doing well, large BM this morning, continent 11.   Severe  protein malnutrition, alb 1.6- supplement with beneprotein, enc hi protein diet choices 12.  Urinary retention-voiding well 13. Left wrist pain. History of distal radial fracture. X-rays indicate collapse of joint space over the last 6 weeks or so. Per surgery, does not appear to be an acutely septic joint. Appreciatehand surgery review of MRI. Will need at least 6 weeks of antibiotics, question broaden coverage. Infectious disease rec same abx for addnl 8 weeks per Dr [name].  MRI quality adequate to judge need for addnl surgery ( no surgery indicated)   LOS (Days) 7 A FACE TO FACE EVALUATION WAS PERFORMED  [name] 05/17/2015, 11:12 AM

## 2015-05-17 NOTE — Op Note (Signed)
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  SURGEON:  Nadara MustardUDA,Shaquavia Whisonant V, MD  PHYSICIAN ASSISTANT:None ANESTHESIA:   General  PREOPERATIVE INDICATIONS:  [name] is a  59 y.o. 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    The risks benefits and alternatives were discussed with the patient preoperatively including but not limited to the risks of infection, bleeding, nerve injury, cardiopulmonary complications, the need for revision surgery, among others, and the patient was willing to proceed.  OPERATIVE IMPLANTS: None  OPERATIVE FINDINGS: Ischemic changes but no purulence no abscess  OPERATIVE PROCEDURE: Patient was brought to the operating room after undergoing a popliteal block. After adequate levels anesthesia were obtained patient's right lower extremity was prepped using DuraPrep draped into a sterile field. A timeout was called. A racquet incision was made through the base of the first and second toes. Patient had ischemic changes which extended proximal to the MTP joints so the amputation was carried more proximally with a complete amputation of the first ray and a partial amputation of the second ray. The wound was irrigated with normal saline hemostasis was obtained. Local tissue rearrangement was performed to close a wound for by 8 cm. 2-0 nylon was used for wound closure. A sterile compressive dressing was applied. Patient was taken to the PACU in stable condition.

## 2015-05-18 ENCOUNTER — Inpatient Hospital Stay (HOSPITAL_COMMUNITY): Payer: Medicaid Other | Admitting: Occupational Therapy

## 2015-05-18 ENCOUNTER — Inpatient Hospital Stay (HOSPITAL_COMMUNITY): Payer: Medicaid Other | Admitting: Physical Therapy

## 2015-05-18 LAB — PROTIME-INR
INR: 2.43 — ABNORMAL HIGH (ref 0.00–1.49)
Prothrombin Time: 26.1 seconds — ABNORMAL HIGH (ref 11.6–15.2)

## 2015-05-18 MED ORDER — WARFARIN SODIUM 7.5 MG PO TABS
7.5000 mg | ORAL_TABLET | ORAL | Status: DC
  Administered 2015-05-18 – 2015-05-24 (×5): 7.5 mg via ORAL
  Filled 2015-05-18 (×6): qty 1

## 2015-05-18 MED ORDER — CETYLPYRIDINIUM CHLORIDE 0.05 % MT LIQD: 7.0000 mL | OROMUCOSAL | Status: DC | PRN

## 2015-05-18 MED ORDER — WARFARIN SODIUM 5 MG PO TABS
5.0000 mg | ORAL_TABLET | ORAL | Status: DC
  Administered 2015-05-20: 5 mg via ORAL
  Filled 2015-05-18: qty 1

## 2015-05-18 NOTE — Progress Notes (Signed)
Physical Therapy Session Note  Patient Details  Name: Gerhard MunchJimmy F Walling MRN: [id number] Date of Birth: [birth year]  Today's Date: 05/18/2015 PT Individual Time: 1111-1151 PT Individual Time Calculation (min): 40 min   Short Term Goals: Week 1:  PT Short Term Goal 1 (Week 1): Pt will initiate ambulation during PT session.  PT Short Term Goal 2 (Week 1): Pt will tolerate sitting up in w/c oob x 4 hours.  PT Short Term Goal 3 (Week 1): Pt will initiate stand-pivot bed to/from w/c transfers.  PT Short Term Goal 4 (Week 1): Pt will initiate stair training during PT session.  PT Short Term Goal 5 (Week 1): Pt will demonstrate weight bearing through R heel during stand-pivot transfers in order to increase functional use of the extremity.   Skilled Therapeutic Interventions/Progress Updates:   Session focused on initiating functional mobility with RLE NWB due to surgery on 10/21. With supine > sit and with lateral scooting transfer from bed > w/c, pt required min A for RLE management to ensure adherence to WB precautions. With verbal/demonstration cueing for gait pattern, pt ambulated x15' in controlled environment with L PFRW, 2-point gait pattern to adhere to RLE NWB. See Care Tool and descriptions below for further detail on assist/cueing required with all aspects of functional mobility. Educated pt on importance of proper RLE alignment (avoiding excessive R hip external rotation) to maintain functional muscle length; pt with verbal understanding/effective return demonstration. Departed with pt seated in w/c with all needs within reach.  Therapy Documentation Precautions:  Precautions Precautions: Fall Precaution Comments: no specific weight bearing order but avoided weight bearing through L wrist; wound to right LE and groin Restrictions Weight Bearing Restrictions: Yes RLE Weight Bearing: Non weight bearing Other Position/Activity Restrictions: L wrist Pain:  Pt reported 7/10 "throbbing" pain  (surgical) in R foot; RN aware and premedicated pt pre-session. Locomotion : Ambulation Ambulation: Yes Ambulation/Gait Assistance: 4: Min assist Ambulation Distance (Feet): 15 Feet Assistive device: Left platform walker Ambulation/Gait Assistance Details: Verbal cues for gait pattern;Manual facilitation for placement;Verbal cues for safe use of DME/AE;Verbal cues for precautions/safety;Visual cues/gestures for sequencing Ambulation/Gait Assistance Details: RLE NWB throughout Gait Gait: Yes Gait Pattern: Impaired Gait Pattern: Step-to pattern;Trunk flexed ("hop-to" pattern to adhere to RLE NWB) Stairs / Additional Locomotion Stairs: No (Unsafe at this time) Corporate treasurerWheelchair Mobility Wheelchair Mobility: Yes Wheelchair Assistance: 6: Modified independent (Device/Increase time) Wheelchair Propulsion: Right upper extremity;Left lower extremity Wheelchair Parts Management: Supervision/cueing Distance: 150'  Balance: Dynamic Sitting Balance Dynamic Sitting - Balance Support: Feet supported Dynamic Sitting - Level of Assistance: 5: Stand by assistance Dynamic Sitting - Balance Activities: Lateral lean/weight shifting;Forward lean/weight shifting;Trunk control activities Sitting balance - Comments: Seated EOM with LLE support, donning shorts  See Function Navigator for Current Functional Status.   Therapy/Group: Individual Therapy  Hobble, Lorenda IshiharaBlair A 05/18/2015, 4:10 PM

## 2015-05-18 NOTE — Progress Notes (Signed)
Subjective/Complaints: Patient seen this morning lying in bed. He states that he is in pain this morning however he recently received pain medications and they seem to be helping.  Review of systems: Denies CP, SOB, n/v/d.   Objective: Vital Signs: Blood pressure 107/62, pulse 74, temperature 98.7 F (37.1 C), temperature source Oral, resp. rate 18, height  (1.905 m), weight 102.059 kg (225 lb), SpO2 100 %. No results found. Results for orders placed or performed during the hospital encounter of 05/10/15 (from the past 72 hour(s))  Protime-INR     Status: Abnormal   Collection Time: 05/16/15  5:00 AM  Result Value Ref Range   Prothrombin Time 26.6 (H) 11.6 - 15.2 seconds   INR 2.49 (H) 0.00 - 1.49  Protime-INR     Status: Abnormal   Collection Time: 05/17/15  4:40 AM  Result Value Ref Range   Prothrombin Time 26.0 (H) 11.6 - 15.2 seconds   INR 2.42 (H) 0.00 - 1.49  Protime-INR     Status: Abnormal   Collection Time: 05/18/15  5:55 AM  Result Value Ref Range   Prothrombin Time 26.1 (H) 11.6 - 15.2 seconds   INR 2.43 (H) 0.00 - 1.49     Gen: NAD. Vital signs reviewed.  HEENT: Normocephalic, atraumatic. Cardio: RRR and no murmur Resp: CTA B/L and unlabored GI: BS positive and NT, ND . Neuro: Alert/Oriented, Anxious, Abnormal Sensation in left fingers and RLE Musc/Skel:  Edema of LUE Tenderness along LUE Antigravity strength throughout Skin:   Dressing to Right foot c/d/i. Left wrist has mild erythema dorsal ulnar aspect, also has edema of the fingers.  Assessment/Plan: 1. 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 Physiatrist is providing close team supervision and 24 hour management of active medical problems  listed below. Physiatrist and rehab team continue to assess barriers to discharge/monitor patient progress toward functional and medical goals.  FIM: Function - Bathing Position: Shower Body parts bathed by patient: Right arm, Left arm, Chest, Abdomen, Front perineal area, Buttocks, Right upper leg, Left upper leg, Left lower leg, Right lower leg Body parts bathed by helper: Back, Right lower leg Assist Level: Supervision or verbal cues  Function- Upper Body Dressing/Undressing What is the patient wearing?: Pull over shirt/dress Pull over shirt/dress - Perfomed by patient: Thread/unthread right sleeve, Thread/unthread left sleeve, Put head through opening, Pull shirt over trunk Pull over shirt/dress - Perfomed by helper: Put head through opening Assist Level: Set up Set up : To obtain clothing/put away Function - Lower Body Dressing/Undressing What is the patient wearing?: Pants, Non-skid slipper socks Position: Wheelchair/chair at sink Pants- Performed by patient: Thread/unthread right pants leg, Thread/unthread left pants leg, Pull pants up/down, Fasten/unfasten pants Pants- Performed by helper: Thread/unthread right pants leg Non-skid slipper socks- Performed by patient: Don/doff left sock Non-skid slipper socks- Performed by helper: Don/doff right sock Assist for footwear: Dependant Assist for lower body dressing: Touching or steadying assistance (Pt > 75%)  Function - Toileting Toileting activity did not occur: N/A Toileting steps completed by patient: Adjust clothing prior to toileting Toileting steps completed by helper: Adjust clothing prior to toileting, Performs perineal hygiene, Adjust clothing after toileting Toileting Assistive Devices: Grab bar or rail Assist level: Supervision or verbal cues  Function - Archivist transfer activity did not occur:  N/A Toilet transfer assistive device: Elevated toilet seat/BSC over toilet Assist level to toilet: Touching  or steadying assistance (Pt > 75%) Assist level from toilet: Touching or steadying assistance (Pt > 75%) Assist level to bedside commode (at bedside): Touching or steadying assistance (Pt > 75%) Assist level from bedside commode (at bedside): Touching or steadying assistance (Pt > 75%)  Function - Chair/bed transfer Chair/bed transfer method: Lateral scoot Chair/bed transfer assist level: Touching or steadying assistance (Pt > 75%) (Supervision level tx; min A slightly uphill tx) Chair/bed transfer assistive device: Armrests Chair/bed transfer details: Verbal cues for technique (head-hips relationship)  Function - Locomotion: Wheelchair Will patient use wheelchair at discharge?: Yes Type: Manual Max wheelchair distance: 175' Assist Level: No help, No cues, assistive device, takes more than reasonable amount of time Assist Level: No help, No cues, assistive device, takes more than reasonable amount of time Wheel 150 feet activity did not occur: Safety/medical concerns Assist Level: No help, No cues, assistive device, takes more than reasonable amount of time Turns around,maneuvers to table,bed, and toilet,negotiates 3% grade,maneuvers on rugs and over doorsills:  (NT) Function - Locomotion: Ambulation Assistive device: Walker-platform Max distance: 2930' with 2 turns Assist level: Touching or steadying assistance (Pt > 75%) Assist level: Touching or steadying assistance (Pt > 75%) Walk 50 feet with 2 turns activity did not occur: Safety/medical concerns Assist level: Touching or steadying assistance (Pt > 75%) Walk 150 feet activity did not occur: Safety/medical concerns Walk 10 feet on uneven surfaces activity did not occur: Safety/medical concerns  Function - Comprehension Comprehension: Auditory Comprehension assist level: Follows complex conversation/direction with extra time/assistive device  Function - Expression Expression: Verbal Expression assist level: Expresses complex  ideas: With extra time/assistive device  Function - Social Interaction Social Interaction assist level: Interacts appropriately with others with medication or extra time (anti-anxiety, antidepressant).  Function - Problem Solving Problem solving assist level: Solves basic problems with no assist  Function - Memory Memory assist level: Recognizes or recalls 90% of the time/requires cueing < 10% of the time Patient normally able to recall (first 3 days only): Current season, Location of own room, Staff names and faces, That he or she is in a hospital  Medical Problem List and Plan: 1. 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  DVT Prophylaxis/Anticoagulation: Coumadin for thromboembolism. Monitor for any bleeding episodes 3. Pain Management: MS Contin 90 mg twice a day, oxycodone for breakthrough pain. Monitor with increased mobility, Developing increased nerve pain in RLE, Start low-dose gabapentin, titrate upward  -Will monitor and consider short term increase in the setting of recent surgery.  4. Mood: Effexor XR 75 mg daily, Klonopin 0.5 mg 4 times a day as needed 5. Neuropsych: This patient is capable of making decisions on his own behalf. 6. 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   7. Fluids/Electrolytes/Nutrition: Strict I and O's with follow-up chemistries  - Labs ordered for Monday 8.ID. Presently on Ancef 2 g 3 times a day. ID rec total 12 wks, 9. Tachycardia SVT. Resolved.  Follow-up cardiology services. Continue Lopressor 50 mg twice a day for now, HR 74 this am 10. Constipation. Laxitive  assistance, per pt doing well, large BM this morning, continent 11.  Severe protein malnutrition, alb 1.6- supplement with  beneprotein, enc  hi protein diet choices 12.  Urinary retention-voiding well 13. Left wrist pain. History of distal radial fracture. X-rays indicate collapse of joint space over the last 6 weeks or so. Per surgery, does not appear to be an acutely septic joint. Appreciatehand surgery review of MRI. Will need at least 6 weeks of antibiotics, question broaden coverage. Infectious disease rec same abx for addnl 8 weeks per Dr [name].  MRI quality adequate to judge need for addnl surgery (no surgery indicated)   LOS (Days) 8 A FACE TO FACE EVALUATION WAS PERFORMED  [name] 05/18/2015, 10:05 AM

## 2015-05-18 NOTE — Progress Notes (Signed)
Physical Therapy Note  Patient Details  Name: Craig MunchJimmy F [name] MRN: [id number] Date of Birth: Jan 06, 1956 Today's Date: 05/18/2015   Attempted to see patient for PT. Pt politely declined due to significant pain and nausea associated with recent surgery. Pt therefore missing 30 minutes of skilled PT. Will re-attempt PT session when able. RN made aware.  Calvert CantorHobble, [name] 05/18/2015, 8:44 AM

## 2015-05-18 NOTE — Progress Notes (Signed)
ANTICOAGULATION CONSULT NOTE - Follow Up Consult  Pharmacy Consult:  Coumadin Indication: RLE DVT    Labs:  Recent Labs  05/16/15 0500 05/17/15 0440 05/18/15 0555  LABPROT 26.6* 26.0* 26.1*  INR 2.49* 2.42* 2.43*     Assessment: 59 YOM with prolonged hospital stay for MSSA bacteremia and septic thrombophlebitis. He has been on Coumadin for right foot DVT since 04/24/15.  INR remains therapeutic despite Coumadin being on hold for two days.  No CBC since 05/13/15, but no bleeding documented.   Goal of Therapy:  INR 2-3    Plan:  - Resume Coumadin 7.5 mg PO qTWFSS and 5mg  qMon & Thurs - Daily PT / INR    Linh Hedberg D. Laney Potashang, PharmD, BCPS Pager:  [phone] 05/18/2015, 1:46 PM

## 2015-05-18 NOTE — Progress Notes (Signed)
Occupational Therapy Session Note  Patient Details  Name: Craig MunchJimmy F [name] MRN: [id number] Date of Birth: [birth year]  Today's Date: 05/18/2015 OT Individual Time: 1530-1602 OT Individual Time Calculation (min): 32 min    Short Term Goals: Week 2:  OT Short Term Goal 1 (Week 2): Pt will complete bathing with setup assist OT Short Term Goal 2 (Week 2): Pt will dress upper body iindependently seated with extra time OT Short Term Goal 3 (Week 2): Pt will don pants using reacher with vc to problem solve OT Short Term Goal 4 (Week 2): Pt will retrieve food from refrigerator using least restrictive adaptive device while maintaining precautions 100% of the time with supervision.  Skilled Therapeutic Interventions/Progress Updates:    Upon entering the room, pt supine in bed with R LE elevated with 7/10 c/o pain in R foot. His wife is present during session. They have several concerns regarding upcoming discharge since recent procedure. OT recommended pt write all questions down for primary therapist. Pt requesting to wash R LE this session to wash dried materials off of skin from procedure. Pt required set up but able to wash above knee with HOB elevated. OT assisted with washing below knee until start of bandage. Pt remains in bed with call bell and all needed items within reach upon exiting.   Therapy Documentation Precautions:  Precautions Precautions: Fall Precaution Comments: no specific weight bearing order but avoided weight bearing through L wrist; wound to right LE and groin Restrictions Weight Bearing Restrictions: Yes LUE Weight Bearing: Non weight bearing RLE Weight Bearing: Non weight bearing Other Position/Activity Restrictions: R wrist  See Function Navigator for Current Functional Status.   Therapy/Group: Individual Therapy  Lowella Gripittman, Shakeisha Horine L 05/18/2015, 4:24 PM

## 2015-05-19 ENCOUNTER — Inpatient Hospital Stay (HOSPITAL_COMMUNITY): Payer: Medicaid Other

## 2015-05-19 ENCOUNTER — Inpatient Hospital Stay (HOSPITAL_COMMUNITY): Payer: Medicaid Other | Admitting: Occupational Therapy

## 2015-05-19 DIAGNOSIS — R52 Pain, unspecified: Secondary | ICD-10-CM | POA: Insufficient documentation

## 2015-05-19 DIAGNOSIS — R609 Edema, unspecified: Secondary | ICD-10-CM | POA: Insufficient documentation

## 2015-05-19 DIAGNOSIS — R29898 Other symptoms and signs involving the musculoskeletal system: Secondary | ICD-10-CM | POA: Insufficient documentation

## 2015-05-19 LAB — PROTIME-INR
INR: 2.38 — AB (ref 0.00–1.49)
Prothrombin Time: 25.7 seconds — ABNORMAL HIGH (ref 11.6–15.2)

## 2015-05-19 NOTE — Progress Notes (Signed)
ANTICOAGULATION CONSULT NOTE - Follow Up Consult  Pharmacy Consult:  Coumadin Indication: RLE DVT    Labs:  Recent Labs  05/17/15 0440 05/18/15 0555 05/19/15 0509  LABPROT 26.0* 26.1* 25.7*  INR 2.42* 2.43* 2.38*     Assessment: 59 YOM with prolonged hospital stay for MSSA bacteremia and septic thrombophlebitis.  Patient continues on Coumadin for right foot DVT (03/2015) and INR remains therapeutic.  No CBC since 05/13/15, but no bleeding documented.   Goal of Therapy:  INR 2-3    Plan:  - Resume Coumadin 7.5 mg PO qTWFSS and 5mg  qMon & Thurs - Daily PT / INR    [name] D. Laney Potashang, PharmD, BCPS Pager:  [phone] 05/19/2015, 10:19 AM

## 2015-05-19 NOTE — Progress Notes (Signed)
Occupational Therapy Session Note  Patient Details  Name: [name] MRN: [id number] Date of Birth: 1955-10-09  Today's Date: 05/19/2015 OT Individual Time:  -       Short Term Goals: Week 1:  OT Short Term Goal 1 (Week 1): Pt will complete stand pivot transfer to [facility] with supervision OT Short Term Goal 1 - Progress (Week 1): Met OT Short Term Goal 2 (Week 1): Pt will dress don underwear/ pants with steadying assist.  OT Short Term Goal 2 - Progress (Week 1): Met OT Short Term Goal 3 (Week 1): Pt will complete all grooming tasks from seated level mod I OT Short Term Goal 3 - Progress (Week 1): Met OT Short Term Goal 4 (Week 1): Pt will demonstrate carryover of education of self-care skills with min questioning cues OT Short Term Goal 4 - Progress (Week 1): Met OT Short Term Goal 5 - Progress (Week 1): Met Week 2:  OT Short Term Goal 1 (Week 2): Pt will complete bathing with setup assist OT Short Term Goal 2 (Week 2): Pt will dress upper body iindependently seated with extra time OT Short Term Goal 3 (Week 2): Pt will don pants using reacher with vc to problem solve OT Short Term Goal 4 (Week 2): Pt will retrieve food from refrigerator using least restrictive adaptive device while maintaining precautions 100% of the time with supervision.  Skilled Therapeutic Interventions/Progress Updates:    Provided gentle wrist motion for flexion/extension, finger MCP and lmphedema massage.  Left written instructions for pt/wife to practice daily when not in therapy.    Therapy Documentation Precautions:  Precautions Precautions: Fall Precaution Comments: no specific weight bearing order but avoided weight bearing through L wrist; wound to right LE and groin Restrictions Weight Bearing Restrictions: Yes LUE Weight Bearing: Non weight bearing RLE Weight Bearing: Non weight bearing Other Position/Activity Restrictions: R wrist     Pain: Pain Assessment Pain Assessment: 6 Pain Score:  Asleep Pain Type: Surgical pain Pain Location: Foot Pain Orientation: Right Pain Descriptors / Indicators: Throbbing Pain Frequency: Constant Pain Onset: On-going Patients Stated Pain Goal: 3 Pain Intervention(s): Medication (See eMAR)             See Function Navigator for Current Functional Status.   Therapy/Group: Individual Therapy  Lisa Roca 05/19/2015, 4:48 PM

## 2015-05-19 NOTE — Progress Notes (Signed)
Subjective/Complaints: This is seen this morning resting comfortably in bed. Patient states that his pain is slowly improving. Does not have any complaints this morning other than the right foot pain.  Review of systems: Right foot pain. Denies CP, SOB, n/v/d.   Objective: Vital Signs: Blood pressure 114/70, pulse 76, temperature 98.1 F (36.7 C), temperature source Oral, resp. rate 18, height 6\' 3"  (1.905 m), weight 103.602 kg (228 lb 6.4 oz), SpO2 100 %. No results found. Results for orders placed or performed during the hospital encounter of 05/10/15 (from the past 72 hour(s))  Protime-INR     Status: Abnormal   Collection Time: 05/17/15  4:40 AM  Result Value Ref Range   Prothrombin Time 26.0 (H) 11.6 - 15.2 seconds   INR 2.42 (H) 0.00 - 1.49  Protime-INR     Status: Abnormal   Collection Time: 05/18/15  5:55 AM  Result Value Ref Range   Prothrombin Time 26.1 (H) 11.6 - 15.2 seconds   INR 2.43 (H) 0.00 - 1.49  Protime-INR     Status: Abnormal   Collection Time: 05/19/15  5:09 AM  Result Value Ref Range   Prothrombin Time 25.7 (H) 11.6 - 15.2 seconds   INR 2.38 (H) 0.00 - 1.49     Gen: NAD. Vital signs reviewed.  HEENT: Normocephalic, atraumatic. Cardio: RRR and no murmur Resp: CTA B/L and unlabored GI: BS positive and NT, ND . Neuro: Alert/Oriented, Anxious, Abnormal Sensation in left fingers and RLE Musc/Skel:  Edema of LUE Tenderness along LUE Antigravity strength throughout Skin:   Dressing to Right foot c/d/i. Left wrist has mild erythema dorsal ulnar aspect, also has edema of the fingers.  Assessment/Plan: 1. 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 Physiatrist is providing close team supervision and 24  hour management of active medical problems listed below. Physiatrist and rehab team continue to assess barriers to discharge/monitor patient progress toward functional and medical goals.  FIM: Function - Bathing Position: Shower Body parts bathed by patient: Right arm, Left arm, Chest, Abdomen, Front perineal area, Buttocks, Right upper leg, Left upper leg, Left lower leg, Right lower leg Body parts bathed by helper: Back, Right lower leg Assist Level: Supervision or verbal cues  Function- Upper Body Dressing/Undressing What is the patient wearing?: Pull over shirt/dress Pull over shirt/dress - Perfomed by patient: Thread/unthread right sleeve, Thread/unthread left sleeve, Put head through opening, Pull shirt over trunk Pull over shirt/dress - Perfomed by helper: Put head through opening Assist Level: Set up Set up : To obtain clothing/put away Function - Lower Body Dressing/Undressing What is the patient wearing?: Pants, Non-skid slipper socks Position: Wheelchair/chair at sink Pants- Performed by patient: Thread/unthread right pants leg, Thread/unthread left pants leg, Pull pants up/down, Fasten/unfasten pants Pants- Performed by helper: Thread/unthread right pants leg Non-skid slipper socks- Performed by patient: Don/doff left sock Non-skid slipper socks- Performed by helper: Don/doff right sock Assist for footwear: Dependant Assist for lower body dressing: Touching or steadying assistance (Pt > 75%)  Function - Toileting Toileting activity did not occur: N/A Toileting steps completed by patient: Adjust clothing prior to toileting Toileting steps completed by helper: Adjust clothing prior to toileting, Performs perineal hygiene, Adjust clothing after toileting Toileting Assistive Devices: Grab bar or rail Assist level: Supervision or verbal cues  Function - Toilet  Transfers Toilet transfer activity did not occur: N/A Toilet transfer assistive device: Elevated toilet seat/BSC over  toilet Assist level to toilet: Touching or steadying assistance (Pt > 75%) Assist level from toilet: Touching or steadying assistance (Pt > 75%) Assist level to bedside commode (at bedside): Touching or steadying assistance (Pt > 75%) Assist level from bedside commode (at bedside): Touching or steadying assistance (Pt > 75%)  Function - Chair/bed transfer Chair/bed transfer method: Lateral scoot, Stand pivot Chair/bed transfer assist level: Touching or steadying assistance (Pt > 75%) Chair/bed transfer assistive device: Armrests, Walker Chair/bed transfer details: Verbal cues for precautions/safety  Function - Locomotion: Wheelchair Will patient use wheelchair at discharge?: Yes Type: Manual Max wheelchair distance: 40 Assist Level: No help, No cues, assistive device, takes more than reasonable amount of time Assist Level: No help, No cues, assistive device, takes more than reasonable amount of time Wheel 150 feet activity did not occur: Safety/medical concerns Assist Level: No help, No cues, assistive device, takes more than reasonable amount of time Turns around,maneuvers to table,bed, and toilet,negotiates 3% grade,maneuvers on rugs and over doorsills:  (NT) Function - Locomotion: Ambulation Assistive device: Walker-platform Max distance: 25 Assist level: Touching or steadying assistance (Pt > 75%) Assist level: Touching or steadying assistance (Pt > 75%) Walk 50 feet with 2 turns activity did not occur: Safety/medical concerns Assist level: Touching or steadying assistance (Pt > 75%) Walk 150 feet activity did not occur: Safety/medical concerns Walk 10 feet on uneven surfaces activity did not occur: Safety/medical concerns  Function - Comprehension Comprehension: Auditory Comprehension assist level: Follows complex conversation/direction with extra time/assistive device  Function - Expression Expression: Verbal Expression assist level: Expresses complex ideas: With no  assist  Function - Social Interaction Social Interaction assist level: Interacts appropriately with others with medication or extra time (anti-anxiety, antidepressant).  Function - Problem Solving Problem solving assist level: Solves basic problems with no assist  Function - Memory Memory assist level: Recognizes or recalls 90% of the time/requires cueing < 10% of the time Patient normally able to recall (first 3 days only): Current season, Location of own room, Staff names and faces, That he or she is in a hospital  Medical Problem List and Plan: 1. 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  DVT Prophylaxis/Anticoagulation: Coumadin for thromboembolism. Monitor for any bleeding episodes  -INR 2.3 on 10/23 3. Pain Management: MS Contin 90 mg twice a day, oxycodone for breakthrough pain. Monitor with increased mobility, Developing increased nerve pain in RLE, Start low-dose gabapentin, titrate upward  -RLE foot pain improving.  4. Mood: Effexor XR 75 mg daily, Klonopin 0.5 mg 4 times a day as needed 5. Neuropsych: This patient is capable of making decisions on his own behalf. 6. 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   7. Fluids/Electrolytes/Nutrition: Strict I and O's with follow-up chemistries  - Labs ordered for Monday 8.ID. Presently on Ancef 2 g 3 times a day. ID rec total 12 wks, 9. Tachycardia SVT. Resolved.  Follow-up cardiology services. Continue Lopressor 50 mg twice a day for now, HR 76 this am 10. Constipation. Laxitive  assistance, per pt doing well, large BM this morning, continent 11.  Severe protein malnutrition, alb 1.6- supplement with beneprotein, enc hi protein diet choices 12.  Urinary  retention-voiding  well 13. Left wrist pain. History of distal radial fracture. X-rays indicate collapse of joint space over the last 6 weeks or so. Per surgery, does not appear to be an acutely septic joint. Appreciatehand surgery review of MRI. Will need at least 6 weeks of antibiotics, question broaden coverage. Infectious disease rec same abx for addnl 8 weeks per Dr [name].  MRI quality adequate to judge need for addnl surgery (no surgery indicated)   LOS (Days) 9 A FACE TO FACE EVALUATION WAS PERFORMED  [name] 05/19/2015, 9:58 AM

## 2015-05-19 NOTE — Progress Notes (Signed)
Physical Therapy Session Note  Patient Details  Name: Craig MunchJimmy F [name] MRN: [id number] Date of Birth: 06-04-1956  Today's Date: 05/19/2015 PT Individual Time: 0830-0900 PT Individual Time Calculation (min): 30 min   Short Term Goals: Week 1:  PT Short Term Goal 1 (Week 1): Pt will initiate ambulation during PT session.  PT Short Term Goal 2 (Week 1): Pt will tolerate sitting up in w/c oob x 4 hours.  PT Short Term Goal 3 (Week 1): Pt will initiate stand-pivot bed to/from w/c transfers.  PT Short Term Goal 4 (Week 1): Pt will initiate stair training during PT session.  PT Short Term Goal 5 (Week 1): Pt will demonstrate weight bearing through R heel during stand-pivot transfers in order to increase functional use of the extremity.   Skilled Therapeutic Interventions/Progress Updates:    Session focused on functional transfers (sit <-> stand) with focus on technique and hand placement in order to adhere to NWB status with min assist, gait training with PFRW with close supervision/steady assist x 25' and x 20' for functional mobility training, short distance w/c mobility back to room, education on therex in the bed/chair to increase strength in BLE, and education in regards to home set-up including building up surfaces for increased ease of transfers (sit <-> stands) especially due to patient's tall height.   Therapy Documentation Precautions:  Precautions Precautions: Fall Precaution Comments: no specific weight bearing order but avoided weight bearing through L wrist; wound to right LE and groin Restrictions Weight Bearing Restrictions: Yes LUE Weight Bearing: Non weight bearing RLE Weight Bearing: Non weight bearing Other Position/Activity Restrictions: R wrist Pain: Pain Assessment Pain Assessment: 0-10 Pain Score: 8  Pain Type: Surgical pain Pain Location: Foot Pain Orientation: Right Pain Descriptors / Indicators: Throbbing Pain Frequency: Constant Pain Onset: On-going Patients  Stated Pain Goal: 2 Pain Intervention(s): Medication (See eMAR)   See Function Navigator for Current Functional Status.   Therapy/Group: Individual Therapy  [name], [name]  [name], PT, DPT  05/19/2015, 9:01 AM

## 2015-05-20 ENCOUNTER — Encounter (HOSPITAL_COMMUNITY): Payer: Self-pay | Admitting: Orthopedic Surgery

## 2015-05-20 ENCOUNTER — Inpatient Hospital Stay (HOSPITAL_COMMUNITY): Payer: Medicaid Other

## 2015-05-20 ENCOUNTER — Inpatient Hospital Stay (HOSPITAL_COMMUNITY): Payer: Medicaid Other | Admitting: Physical Therapy

## 2015-05-20 DIAGNOSIS — B488 Other specified mycoses: Secondary | ICD-10-CM

## 2015-05-20 DIAGNOSIS — Z89421 Acquired absence of other right toe(s): Secondary | ICD-10-CM

## 2015-05-20 DIAGNOSIS — Z89419 Acquired absence of unspecified great toe: Secondary | ICD-10-CM | POA: Insufficient documentation

## 2015-05-20 DIAGNOSIS — M86171 Other acute osteomyelitis, right ankle and foot: Secondary | ICD-10-CM

## 2015-05-20 DIAGNOSIS — M00032 Staphylococcal arthritis, left wrist: Secondary | ICD-10-CM

## 2015-05-20 DIAGNOSIS — B9561 Methicillin susceptible Staphylococcus aureus infection as the cause of diseases classified elsewhere: Secondary | ICD-10-CM

## 2015-05-20 LAB — BASIC METABOLIC PANEL
Anion gap: 8 (ref 5–15)
BUN: 14 mg/dL (ref 6–20)
CHLORIDE: 100 mmol/L — AB (ref 101–111)
CO2: 29 mmol/L (ref 22–32)
CREATININE: 0.83 mg/dL (ref 0.61–1.24)
Calcium: 8.7 mg/dL — ABNORMAL LOW (ref 8.9–10.3)
Glucose, Bld: 104 mg/dL — ABNORMAL HIGH (ref 65–99)
POTASSIUM: 4.5 mmol/L (ref 3.5–5.1)
SODIUM: 137 mmol/L (ref 135–145)

## 2015-05-20 LAB — CBC WITH DIFFERENTIAL/PLATELET
BASOS PCT: 1 %
Basophils Absolute: 0.1 10*3/uL (ref 0.0–0.1)
EOS ABS: 0.6 10*3/uL (ref 0.0–0.7)
EOS PCT: 8 %
HCT: 26.4 % — ABNORMAL LOW (ref 39.0–52.0)
HEMOGLOBIN: 7.7 g/dL — AB (ref 13.0–17.0)
Lymphocytes Relative: 41 %
Lymphs Abs: 3.4 10*3/uL (ref 0.7–4.0)
MCH: 24.9 pg — ABNORMAL LOW (ref 26.0–34.0)
MCHC: 29.2 g/dL — AB (ref 30.0–36.0)
MCV: 85.4 fL (ref 78.0–100.0)
Monocytes Absolute: 0.7 10*3/uL (ref 0.1–1.0)
Monocytes Relative: 9 %
NEUTROS PCT: 41 %
Neutro Abs: 3.2 10*3/uL (ref 1.7–7.7)
PLATELETS: 260 10*3/uL (ref 150–400)
RBC: 3.09 MIL/uL — AB (ref 4.22–5.81)
RDW: 15.9 % — ABNORMAL HIGH (ref 11.5–15.5)
WBC: 8 10*3/uL (ref 4.0–10.5)

## 2015-05-20 LAB — PROTIME-INR
INR: 2.02 — ABNORMAL HIGH (ref 0.00–1.49)
PROTHROMBIN TIME: 22.8 s — AB (ref 11.6–15.2)

## 2015-05-20 MED ORDER — WARFARIN SODIUM 2.5 MG PO TABS
2.5000 mg | ORAL_TABLET | Freq: Once | ORAL | Status: AC
  Administered 2015-05-20: 2.5 mg via ORAL
  Filled 2015-05-20: qty 1

## 2015-05-20 MED ORDER — WARFARIN SODIUM 5 MG PO TABS
5.0000 mg | ORAL_TABLET | ORAL | Status: DC
  Administered 2015-05-23: 5 mg via ORAL
  Filled 2015-05-20: qty 1

## 2015-05-20 NOTE — Progress Notes (Signed)
Subjective/Complaints: Right foot pain is improving. Tolerating therapy but is having some difficulty avoiding weightbearing to the right foot   Review of systems: Right foot pain. Denies CP, SOB, n/v/d.   Objective: Vital Signs: Blood pressure 104/61, pulse 65, temperature 97.6 F (36.4 C), temperature source Oral, resp. rate 18, height $RemoveBe'6\' 3"'PqOWysVia$  (1.905 m), weight 103.602 kg (228 lb 6.4 oz), SpO2 98 %. No results found. Results for orders placed or performed during the hospital encounter of 05/10/15 (from the past 72 hour(s))  Protime-INR     Status: Abnormal   Collection Time: 05/18/15  5:55 AM  Result Value Ref Range   Prothrombin Time 26.1 (H) 11.6 - 15.2 seconds   INR 2.43 (H) 0.00 - 1.49  Protime-INR     Status: Abnormal   Collection Time: 05/19/15  5:09 AM  Result Value Ref Range   Prothrombin Time 25.7 (H) 11.6 - 15.2 seconds   INR 2.38 (H) 0.00 - 1.49  Protime-INR     Status: Abnormal   Collection Time: 05/20/15  4:35 AM  Result Value Ref Range   Prothrombin Time 22.8 (H) 11.6 - 15.2 seconds   INR 2.02 (H) 0.00 - 1.49  CBC with Differential/Platelet     Status: Abnormal   Collection Time: 05/20/15  4:35 AM  Result Value Ref Range   WBC 8.0 4.0 - 10.5 K/uL   RBC 3.09 (L) 4.22 - 5.81 MIL/uL   Hemoglobin 7.7 (L) 13.0 - 17.0 g/dL   HCT 26.4 (L) 39.0 - 52.0 %   MCV 85.4 78.0 - 100.0 fL   MCH 24.9 (L) 26.0 - 34.0 pg   MCHC 29.2 (L) 30.0 - 36.0 g/dL   RDW 15.9 (H) 11.5 - 15.5 %   Platelets 260 150 - 400 K/uL   Neutrophils Relative % 41 %   Neutro Abs 3.2 1.7 - 7.7 K/uL   Lymphocytes Relative 41 %   Lymphs Abs 3.4 0.7 - 4.0 K/uL   Monocytes Relative 9 %   Monocytes Absolute 0.7 0.1 - 1.0 K/uL   Eosinophils Relative 8 %   Eosinophils Absolute 0.6 0.0 - 0.7 K/uL   Basophils Relative 1 %   Basophils Absolute 0.1 0.0 - 0.1 K/uL  Basic metabolic panel     Status: Abnormal   Collection Time: 05/20/15  4:35 AM  Result Value Ref Range   Sodium 137 135 - 145 mmol/L   Potassium 4.5 3.5 - 5.1 mmol/L   Chloride 100 (L) 101 - 111 mmol/L   CO2 29 22 - 32 mmol/L   Glucose, Bld 104 (H) 65 - 99 mg/dL   BUN 14 6 - 20 mg/dL   Creatinine, Ser 0.83 0.61 - 1.24 mg/dL   Calcium 8.7 (L) 8.9 - 10.3 mg/dL   GFR calc non Af Amer >60 >60 mL/min   GFR calc Af Amer >60 >60 mL/min    Comment: (NOTE) The eGFR has been calculated using the CKD EPI equation. This calculation has not been validated in all clinical situations. eGFR's persistently <60 mL/min signify possible Chronic Kidney Disease.    Anion gap 8 5 - 15     Gen: NAD. Vital signs reviewed.  HEENT: Normocephalic, atraumatic. Cardio: RRR and no murmur Resp: CTA B/L and unlabored GI: BS positive and NT, ND . Neuro: Alert/Oriented, Anxious, Abnormal Sensation in left fingers and RLE Musc/Skel:  Edema of LUE Tenderness along LUE Antigravity strength throughout Skin:   Dressing to Right foot c/d/i. Left wrist has mild erythema  dorsal ulnar aspect, also has edema of the fingers.  Assessment/Plan: 1. 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 Physiatrist is providing close team supervision and 24 hour management of active medical problems listed below. Physiatrist and rehab team continue to assess barriers to discharge/monitor patient progress toward functional and medical goals.  FIM: Function - Bathing Position: Shower Body parts bathed by patient: Right arm, Left arm, Chest, Abdomen, Front perineal area, Buttocks, Right upper leg, Left upper leg, Left lower leg, Right lower leg Body parts bathed by helper: Back, Right lower leg Assist Level: Supervision or verbal cues  Function- Upper Body Dressing/Undressing What is the patient wearing?: Pull over  shirt/dress Pull over shirt/dress - Perfomed by patient: Thread/unthread right sleeve, Thread/unthread left sleeve, Put head through opening, Pull shirt over trunk Pull over shirt/dress - Perfomed by helper: Put head through opening Assist Level: Set up Set up : To obtain clothing/put away Function - Lower Body Dressing/Undressing What is the patient wearing?: Pants, Non-skid slipper socks Position: Wheelchair/chair at sink Pants- Performed by patient: Thread/unthread right pants leg, Thread/unthread left pants leg, Pull pants up/down, Fasten/unfasten pants Pants- Performed by helper: Thread/unthread right pants leg Non-skid slipper socks- Performed by patient: Don/doff left sock Non-skid slipper socks- Performed by helper: Don/doff right sock Assist for footwear: Dependant Assist for lower body dressing: Touching or steadying assistance (Pt > 75%)  Function - Toileting Toileting activity did not occur: N/A Toileting steps completed by patient: Adjust clothing prior to toileting Toileting steps completed by helper: Adjust clothing prior to toileting, Performs perineal hygiene, Adjust clothing after toileting Toileting Assistive Devices: Grab bar or rail Assist level: Supervision or verbal cues  Function - Air cabin crew transfer activity did not occur: N/A Toilet transfer assistive device: Elevated toilet seat/BSC over toilet Assist level to toilet: Touching or steadying assistance (Pt > 75%) Assist level from toilet: Touching or steadying assistance (Pt > 75%) Assist level to bedside commode (at bedside): Touching or steadying assistance (Pt > 75%) Assist level from bedside commode (at bedside): Touching or steadying assistance (Pt > 75%)  Function - Chair/bed transfer Chair/bed transfer method: Lateral scoot, Stand pivot Chair/bed transfer assist level: Touching or steadying assistance (Pt > 75%) Chair/bed transfer assistive device: Armrests, Walker Chair/bed transfer  details: Verbal cues for precautions/safety  Function - Locomotion: Wheelchair Will patient use wheelchair at discharge?: Yes Type: Manual Max wheelchair distance: 40 Assist Level: No help, No cues, assistive device, takes more than reasonable amount of time Assist Level: No help, No cues, assistive device, takes more than reasonable amount of time Wheel 150 feet activity did not occur: Safety/medical concerns Assist Level: No help, No cues, assistive device, takes more than reasonable amount of time Turns around,maneuvers to table,bed, and toilet,negotiates 3% grade,maneuvers on rugs and over doorsills:  (NT) Function - Locomotion: Ambulation Assistive device: Walker-platform Max distance: 25 Assist level: Touching or steadying assistance (Pt > 75%) Assist level: Touching or steadying assistance (Pt > 75%) Walk 50 feet with 2 turns activity did not occur: Safety/medical concerns Assist level: Touching or steadying assistance (Pt > 75%) Walk 150 feet activity did not occur: Safety/medical concerns Walk 10 feet on uneven surfaces activity did not occur: Safety/medical concerns  Function - Comprehension Comprehension: Auditory Comprehension assist level: Follows complex conversation/direction with extra time/assistive device  Function -  Expression Expression: Verbal Expression assist level: Expresses complex ideas: With no assist  Function - Social Interaction Social Interaction assist level: Interacts appropriately with others with medication or extra time (anti-anxiety, antidepressant).  Function - Problem Solving Problem solving assist level: Solves basic problems with no assist  Function - Memory Memory assist level: Recognizes or recalls 90% of the time/requires cueing < 10% of the time Patient normally able to recall (first 3 days only): Current season, Location of own room, Staff names and faces, That he or she is in a hospital  Medical Problem List and Plan: 1.  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  DVT Prophylaxis/Anticoagulation: Coumadin for thromboembolism. Monitor for any bleeding episodes  -INR 2.02 on 10/24, no signs of bleeding at the current time 3. Pain Management: MS Contin 90 mg twice a day, oxycodone for breakthrough pain. Monitor with increased mobility, Developing increased nerve pain in RLE, Start low-dose gabapentin, titrate upward  -RLE foot pain improving.  4. Mood: Effexor XR 75 mg daily, Klonopin 0.5 mg 4 times a day as needed 5. Neuropsych: This patient is capable of making decisions on his own behalf. 6. 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   7. Fluids/Electrolytes/Nutrition: Strict I and O's with follow-up chemistries  - hydration appears adequate 8.ID. Presently on Ancef 2 g 3 times a day. ID rec total 12 wks, 9. Tachycardia SVT. Resolved.  Follow-up cardiology services. Continue Lopressor 50 mg twice a day for now, HR 76 this am 10. Constipation. Laxitive  assistance, per pt doing well, large BM this morning, continent 11.  Severe protein malnutrition, alb 1.6- supplement with beneprotein, enc hi protein diet choices 12.  Urinary retention-voiding well 13. Left wrist pain. History of distal radial fracture. X-rays indicate collapse of joint space over the last 6 weeks or so. Per surgery, does not appear to be an acutely septic joint. Appreciatehand surgery review of MRI. Will need at least 6 weeks of antibiotics, question broaden coverage. Infectious disease rec same abx for addnl 8 weeks per Dr Lenon Curt.  MRI quality adequate to judge need for addnl surgery (no surgery indicated)  14. Acute blood loss anemia likely due to  most recent surgery LOS (Days) 10 A FACE TO FACE EVALUATION WAS PERFORMED  [name] 05/20/2015, 12:40 PM

## 2015-05-20 NOTE — Progress Notes (Signed)
Occupational Therapy Session Note  Patient Details  Name: [name] MRN: [id number] Date of Birth: Jul 03, 1956  Today's Date: 05/20/2015 OT Individual Time: 1122-1210 OT Individual Time Calculation (min): 48 min    Short Term Goals: Week 2:  OT Short Term Goal 1 (Week 2): Pt will complete bathing with setup assist OT Short Term Goal 2 (Week 2): Pt will dress upper body iindependently seated with extra time OT Short Term Goal 3 (Week 2): Pt will don pants using reacher with vc to problem solve OT Short Term Goal 4 (Week 2): Pt will retrieve food from refrigerator using least restrictive adaptive device while maintaining precautions 100% of the time with supervision.  Skilled Therapeutic Interventions/Progress Updates: ADL-retraining with focus on improved mobility, improved adherence to NWB precautions s/p recent amputation at right foot, orthotics management, and adapted bathing/dressing.   Pt completes bed mobility and transfer unassisted after setup but requests use of w/c vs platform walker d/t discomfort at right foot.   Pt performs transfers to toilet and tub bench from w/c with steadying assist but requires no assist with either toileting or bathing after setup with shower barriers for right arm and foot.   Pt requires cues to inhibit use of right arm for weight-bearing during transfer.    Pt requires physical assist with lower body dressing d/t limited time this session and is then fitted with custom orthotic for trial wearing of only 2 hrs this date.   Plan to f/u with pt during next BADL session to practice donning/doffing unassisted and need for mods/adjustments.      Therapy Documentation Precautions:  Precautions Precautions: Fall Precaution Comments: no specific weight bearing order but avoided weight bearing through L wrist; wound to right LE and groin Restrictions Weight Bearing Restrictions: Yes LUE Weight Bearing: Non weight bearing RLE Weight Bearing: Non weight  bearing Other Position/Activity Restrictions: R wrist   Pain:  Pain Assessment Pain Assessment: 0-10 Pain Score: 6  Pain Type: Surgical pain Pain Location: Foot Pain Orientation: Right;Distal Pain Frequency: Intermittent Pain Onset: With Activity Patients Stated Pain Goal: 3 Pain Intervention(s): Repositioned  See Function Navigator for Current Functional Status.   Therapy/Group: Individual Therapy  [name] 05/20/2015, 12:55 PM

## 2015-05-20 NOTE — Progress Notes (Signed)
Physical Therapy Weekly Progress Note  Patient Details  Name: [name] MRN: [id number] Date of Birth: [birth year]  Beginning of progress report period: May 11, 2015 End of progress report period: May 20, 2015  Patient has made steady progress and has met 3 of 5 short term goals with 2 of the goals being discontinued due to pt now NWB RLE s/p amputation of R great toe on Friday 05/17/15.  Pt is currently min A overall for bed mobility, stand pivot transfers, standing balance, gait short distances and supervision for w/c mobility.    Patient continues to demonstrate the following deficits: pain, impaired activity tolerance/endurance, impaired strength, ROM, balance, gait and therefore will continue to benefit from skilled PT intervention to enhance overall performance with activity tolerance, balance, ability to compensate for deficits and functional use of  right upper extremity and left lower extremity.  Patient not progressing toward long term goals.  See goal revision..  Plan of care revisions: Goals downgraded to supervision overall due to pt now NWB RLE s/p amputation.  LOS lengthened due to surgery in middle of stay and increased time needed to reach goal level and complete family education.  PT Short Term Goals Week 1:  PT Short Term Goal 1 (Week 1): Pt will initiate ambulation during PT session.  PT Short Term Goal 1 - Progress (Week 1): Met PT Short Term Goal 2 (Week 1): Pt will tolerate sitting up in w/c oob x 4 hours.  PT Short Term Goal 2 - Progress (Week 1): Met PT Short Term Goal 3 (Week 1): Pt will initiate stand-pivot bed to/from w/c transfers.  PT Short Term Goal 3 - Progress (Week 1): Met PT Short Term Goal 4 (Week 1): Pt will initiate stair training during PT session.  PT Short Term Goal 4 - Progress (Week 1): Discontinued (comment) PT Short Term Goal 5 (Week 1): Pt will demonstrate weight bearing through R heel during stand-pivot transfers in order to increase  functional use of the extremity.  PT Short Term Goal 5 - Progress (Week 1): Discontinued (comment) Week 2:  PT Short Term Goal 1 (Week 2): = LTG which have been downgraded with D/C by end of week  Therapy Documentation Precautions:  Precautions Precautions: Fall Precaution Comments: no specific weight bearing order but avoided weight bearing through L wrist; wound to right LE and groin Restrictions Weight Bearing Restrictions: Yes LUE Weight Bearing: Non weight bearing RLE Weight Bearing: Non weight bearing Other Position/Activity Restrictions: R wrist Vital Signs: Therapy Vitals Temp: 97.7 F (36.5 C) Temp Source: Oral Pulse Rate: 70 Resp: 18 BP: 112/69 mmHg Patient Position (if appropriate): Lying Oxygen Therapy SpO2: 100 % O2 Device: Not Delivered   See Function Navigator for Current Functional Status.   [name] 05/20/2015, 3:49 PM

## 2015-05-20 NOTE — Progress Notes (Signed)
Orthopedic Tech Progress Note Patient Details:  [name] Oct 09, 1955 [id number]  Ortho Devices Type of Ortho Device: CAM walker Ortho Device/Splint Location: rle Ortho Device/Splint Interventions: Application   [name] 05/20/2015, 7:56 AM

## 2015-05-20 NOTE — Progress Notes (Signed)
[facility] for Infectious Disease    Subjective: No new complaints, taking a shower   Antibiotics:  Anti-infectives    Start     Dose/Rate Route Frequency Ordered Stop   05/17/15 2200  ceFAZolin (ANCEF) IVPB 1 g/50 mL premix  Status:  Discontinued     1 g 100 mL/hr over 30 Minutes Intravenous Every 6 hours 05/17/15 1558 05/17/15 1605   05/17/15 2200  ceFAZolin (ANCEF) IVPB 2 g/50 mL premix     2 g 100 mL/hr over 30 Minutes Intravenous Every 8 hours 05/17/15 1605     05/15/15 2000  ceFAZolin (ANCEF) IVPB 2 g/50 mL premix  Status:  Discontinued     2 g 100 mL/hr over 30 Minutes Intravenous Every 8 hours 05/15/15 1341 05/17/15 1603   05/10/15 2000  ceFAZolin (ANCEF) IVPB 2 g/50 mL premix  Status:  Discontinued     2 g 100 mL/hr over 30 Minutes Intravenous Every 8 hours 05/10/15 1600 05/15/15 1341   05/10/15 1600  famciclovir (FAMVIR) tablet 500 mg     500 mg Oral 2 times daily PRN 05/10/15 1600        Medications: Scheduled Meds: . atorvastatin  80 mg Oral q1800  .  ceFAZolin (ANCEF) IV  2 g Intravenous Q8H  . docusate sodium  100 mg Oral BID  . feeding supplement (ENSURE ENLIVE)  237 mL Oral BID BM  . flecainide  100 mg Oral Q12H  . gabapentin  100 mg Oral TID  . metoprolol tartrate  75 mg Oral BID  . morphine  90 mg Oral Q12H  . multivitamin with minerals  1 tablet Oral Daily  . naloxegol oxalate  25 mg Oral QAC breakfast  . naproxen  500 mg Oral BID WC  . oxyCODONE  30 mg Oral TID PC & HS  . polyethylene glycol  17 g Oral Daily  . venlafaxine XR  75 mg Oral Q breakfast  . warfarin  5 mg Oral Once per day on Mon Thu  . warfarin  7.5 mg Oral Once per day on Sun Tue Wed Fri Sat  . Warfarin - Pharmacist Dosing Inpatient   Does not apply q1800   Continuous Infusions:  PRN Meds:.acetaminophen **OR** acetaminophen, albuterol, antiseptic oral rinse, clonazePAM, famciclovir, methocarbamol **OR** methocarbamol (ROBAXIN)  IV, metoCLOPramide **OR**  metoCLOPramide (REGLAN) injection, ondansetron **OR** ondansetron (ZOFRAN) IV, oxyCODONE-acetaminophen, sodium chloride, sorbitol, SUMAtriptan, trifluridine    Objective: Weight change:   Intake/Output Summary (Last 24 hours) at 05/20/15 1225 Last data filed at 05/20/15 1124  Gross per 24 hour  Intake    380 ml  Output   2050 ml  Net  -1670 ml   Blood pressure 104/61, pulse 65, temperature 97.6 F (36.4 C), temperature source Oral, resp. rate 18, height $RemoveBe'6\' 3"'yxHFcgXja$  (1.905 m), weight 228 lb 6.4 oz (103.602 kg), SpO2 98 %. Temp:  [97.6 F (36.4 C)-98.2 F (36.8 C)] 97.6 F (36.4 C) (10/24 0505) Pulse Rate:  [65-73] 65 (10/24 0505) Resp:  [15-18] 18 (10/24 0505) BP: (104-111)/(61-74) 104/61 mmHg (10/24 0505) SpO2:  [98 %] 98 % (10/24 0505)  Physical Exam: General: Alert and awake, oriented x3, not in any acute distress. Taking a shower Left wrist with wound 05/14/15:     05/14/15: thigh surgical site with good granulation tissue present      05/14/15:Toes with purulent material between first and second toes and thrombotic areas visualized  CBC:  CBC Latest Ref Rng 05/20/2015 05/13/2015 05/10/2015  WBC 4.0 - 10.5 K/uL 8.0 7.9 8.0  Hemoglobin 13.0 - 17.0 g/dL 7.7(L) 8.1(L) 9.0(L)  Hematocrit 39.0 - 52.0 % 26.4(L) 27.3(L) 28.6(L)  Platelets 150 - 400 K/uL 260 329 365       BMET  Recent Labs  05/20/15 0435  NA 137  K 4.5  CL 100*  CO2 29  GLUCOSE 104*  BUN 14  CREATININE 0.83  CALCIUM 8.7*     Liver Panel  No results for input(s): PROT, ALBUMIN, AST, ALT, ALKPHOS, BILITOT, BILIDIR, IBILI in the last 72 hours.     Sedimentation Rate No results for input(s): ESRSEDRATE in the last 72 hours. C-Reactive Protein No results for input(s): CRP in the last 72 hours.  Micro Results: Recent Results (from the past 720 hour(s))  Urine culture     Status: None   Collection Time: 05/03/15  6:10 PM  Result Value Ref Range Status    Specimen Description URINE, RANDOM  Final   Special Requests NONE  Final   Culture NO GROWTH 2 DAYS  Final   Report Status 05/05/2015 FINAL  Final  MRSA PCR Screening     Status: None   Collection Time: 05/08/15  9:08 AM  Result Value Ref Range Status   MRSA by PCR NEGATIVE NEGATIVE Final    Comment:        The GeneXpert MRSA Assay (FDA approved for NASAL specimens only), is one component of a comprehensive MRSA colonization surveillance program. It is not intended to diagnose MRSA infection nor to guide or monitor treatment for MRSA infections.   Culture, blood (routine x 2)     Status: None   Collection Time: 05/08/15 10:40 AM  Result Value Ref Range Status   Specimen Description BLOOD LEFT FINGER  Final   Special Requests BOTTLES DRAWN AEROBIC AND ANAEROBIC 10CC  Final   Culture NO GROWTH 5 DAYS  Final   Report Status 05/13/2015 FINAL  Final  Culture, blood (routine x 2)     Status: None   Collection Time: 05/08/15 10:47 AM  Result Value Ref Range Status   Specimen Description BLOOD LEFT ANTECUBITAL  Final   Special Requests BOTTLES DRAWN AEROBIC ONLY 10CC  Final   Culture NO GROWTH 5 DAYS  Final   Report Status 05/13/2015 FINAL  Final  Urine culture     Status: None   Collection Time: 05/08/15  7:42 PM  Result Value Ref Range Status   Specimen Description URINE, RANDOM  Final   Special Requests NONE  Final   Culture NO GROWTH 1 DAY  Final   Report Status 05/09/2015 FINAL  Final    Studies/Results: No results found.    Assessment/Plan:  INTERVAL HISTORY:   05/14/15: Discussed case with Dr. Lenon Curt yesterday   Principal Problem:   Debility Active Problems:   Chronic pain syndrome   Cellulitis of left hand   Acute blood loss anemia   Nondisplaced fracture of distal end of left radius   Sepsis (HCC)   Femoral neuropathy of right lower extremity   Adjustment disorder with depressed mood   Mycotic aneurysm   Thigh abscess   Hand abscess   Septic embolism  (HCC)   Gangrene of toe (HCC)   Acute osteomyelitis of left ulna (HCC)   Streptococcal arthritis of left wrist (HCC)   Pain   Swelling   Warmth of joint    [name] is a 59 y.o.  male with  with MSSA bacteremia infected mycotic aneurysm and thigh abscess sp ALT surgery by vascular surgery then plastic surgeries also with fractured wrist and abscess in hand status post I&D of hand abscess, thrombotic lesions to toes where he has chronic wounds and areas that may require amputation now with MRI showing evidence of septic arthritis and osteomyelitis in his left wrist worries had worsening pain recently. He is now sp amputation of two toes by Dr. Sharol Given  #1 Left wrist with increasing pain and MRI showing evidence of septic arthritis and osteomyelitis:  Continue protracted systemic  Antibiotics and repeat an MRII 2-4 weeks  From PRIOR ONEto see if this process is getting better or worsening and needing surgical intervention  #2 purulent drainage from foot where he has multiple thrombotic lesions and necrosis: sp amputation of 2 toes by Dr. Sharol Given  Monitor site closely  #3 MSSA bacteremia: he never had TEE before and had all 4 arrhythmias would consider having the patient undergo transesophageal echocardiogram. Though if he would be poor surgical candidate then there may not be much use in this procedure   continue with parenteral therapy for an additional 8 weeks esp in light of report of osteomyelitis in his wrist.   I would also plan on giving him protracted oral antibiotics afterwards    I will work on outpatient followup with [place]  Diagnosis: MSSA bacteremia, mycotic aneurysm, septic arthritis left wrist, osteomyelitis of toes  Culture Result: MSSA  Allergies  Allergen Reactions  . Imitrex [Sumatriptan] Other (See Comments)    Chest pain, 2004 (tolerates 50 mg prn)  . Verapamil Other (See Comments)    Causes pvc's    Discharge antibiotics: IV ancef 2 g I Vq  8  horus  Duration: 8 weeks  End Date:  07/05/15  [facility] Care Per Protocol: Labs weekly while on IV antibiotics: _x_ CBC with differential x__ BMP x__ CRP _x_ ESR   Fax weekly labs to [phone]  Clinic Follow Up Appt: 4 weeks    Otherwise I will sign off for now   Please call with further questions.      LOS: 10 days   Alcide Evener 05/20/2015, 12:25 PM

## 2015-05-20 NOTE — Consult Note (Addendum)
WOC wound consult note Reason for Consult: Consult requested for right thigh.  This wound is being followed by plastic surgery team and they had previously ordered Aquacel dressings to be changed every other day.  Refer to their recent progress notes on 10/13. The orders were inadvertently discontinued after the patient had surgery.  Requested to re-order topical treatment to this wound.   Hand surgeon is following for left upper extremity wounds and Dr Lajoyce Cornersuda of the ortho team is following for right foot wounds.  Please refer to their teams for further questions regarding these locations. Wound type: Right upper thigh with healing full thickness wound Measurement: 5.2X2.5X.1cm Wound bed: 100% beefy red Drainage (amount, consistency, odor) Mod amt yellow drainage, no odor  Periwound: Intact skin surrounding Dressing procedure/placement/frequency: Continue present plan of care with Aquacel applied every other day to absorb drainage and provide antimicrobial benefits. Pt is currently showering every other day and dressing can be changed afterwards.  Foam dressing to protect site.   He can resume follow-up with the plastics team after discharge. Please re-consult if further assistance is needed.  Thank-you,  Cammie Mcgeeawn Clotilda Hafer MSN, RN, CWOCN, [place], CNS [phone]

## 2015-05-20 NOTE — Progress Notes (Signed)
Physical Therapy Session Note  Patient Details  Name: [name] MRN: [id number] Date of Birth: 1955-09-01  Today's Date: 05/20/2015 PT Individual Time: 1405-1505 PT Individual Time Calculation (min): 60 min   Short Term Goals: Week 1:  PT Short Term Goal 1 (Week 1): Pt will initiate ambulation during PT session.  PT Short Term Goal 1 - Progress (Week 1): Met PT Short Term Goal 2 (Week 1): Pt will tolerate sitting up in w/c oob x 4 hours.  PT Short Term Goal 2 - Progress (Week 1): Met PT Short Term Goal 3 (Week 1): Pt will initiate stand-pivot bed to/from w/c transfers.  PT Short Term Goal 3 - Progress (Week 1): Met PT Short Term Goal 4 (Week 1): Pt will initiate stair training during PT session.  PT Short Term Goal 4 - Progress (Week 1): Discontinued (comment) PT Short Term Goal 5 (Week 1): Pt will demonstrate weight bearing through R heel during stand-pivot transfers in order to increase functional use of the extremity.  PT Short Term Goal 5 - Progress (Week 1): Discontinued (comment)  Skilled Therapeutic Interventions/Progress Updates:   Pt received supine in bed and agreeable to therapy session. Session focused on strengthening, functional transfers, w/c mobility, gait progression, and endurance.  PT instructed patient in gait x40' +40' with PRW and w/c follow.  Pt amb with supervision/steady assist and verbal cues for step length on L, weight bearing restrictions, upright posture, and walker positioning.  Pt required seated rest break after first 40' and demos posterior LOB during stand>sit requiring +2 assist into w/c.  PT instructed patient in BLE therex x20 reps for LAQ and hip flexion for endurance, strengthening, and ROM to hips and knees.  Pt propelled w/c x130 feet and PT propelled remaining distance to room. Pt performed squat pivot transfer with supervision for safety.  Pt positioned supine with call bell in reach and needs met.   Therapy  Documentation Precautions: Precautions Precautions: Fall Precaution Comments: no specific weight bearing order but avoided weight bearing through L wrist; wound to right LE and groin Restrictions Weight Bearing Restrictions: Yes LUE Weight Bearing: Non weight bearing RLE Weight Bearing: Non weight bearing Other Position/Activity Restrictions: R wrist Pain: Pain Assessment Pain Assessment: 0-10 Pain Score: 5  Pain Location: Foot Pain Orientation: Right Pain Intervention(s): Emotional support   See Function Navigator for Current Functional Status.   Therapy/Group: Individual Therapy  Earnest Conroy Penven-Crew 05/20/2015, 5:18 PM

## 2015-05-20 NOTE — Progress Notes (Signed)
ANTICOAGULATION CONSULT NOTE - Follow Up Consult  Pharmacy Consult:  Coumadin Indication: RLE DVT    Labs:  Recent Labs  05/18/15 0555 05/19/15 0509 05/20/15 0435  HGB  --   --  7.7*  HCT  --   --  26.4*  PLT  --   --  260  LABPROT 26.1* 25.7* 22.8*  INR 2.43* 2.38* 2.02*  CREATININE  --   --  0.83     Assessment: [age] YOM with prolonged hospital stay for MSSA bacteremia and septic thrombophlebitis. INR = 2.02.  Patient continues on Coumadin for right foot DVT (03/2015) and INR remains therapeutic.   CBC today Hgb 7.7 and pltc 260. No bleeding documented. s/p right 1st and 2nd Ray Amputation POD #3 (coumadin was held 10/20 &10/21).    Goal of Therapy:  INR 2-3    Plan:  Coumadin 5 mg po was already given this AM. Will give extra 2.5 mg today for total of 7.5mg  today.  -Then resume Coumadin 7.5 mg PO qTWFSS and 5mg  qMon & Thurs - Daily PT / INR   [name], RPh Clinical Pharmacist Pager: [phone] 05/20/2015, 1:31 PM

## 2015-05-20 NOTE — Plan of Care (Signed)
Problem: RH Balance Goal: LTG Patient will maintain dynamic standing balance (PT) LTG: Patient will maintain dynamic standing balance with assistance during mobility activities (PT)  Downgraded 05/20/15 due to new NWB RLE  Problem: RH Bed Mobility Goal: LTG Patient will perform bed mobility with assist (PT) LTG: Patient will perform bed mobility with assistance, with/without cues (PT).  Downgraded 05/20/15   Problem: RH Bed to Chair Transfers Goal: LTG Patient will perform bed/chair transfers w/assist (PT) LTG: Patient will perform bed/chair transfers with assistance, with/without cues (PT).  Downgraded 05/20/15 due to new NWB RLE  Problem: RH Car Transfers Goal: LTG Patient will perform car transfers with assist (PT) LTG: Patient will perform car transfers with assistance (PT).  Downgraded 05/20/15  Problem: RH Furniture Transfers Goal: LTG Patient will perform furniture transfers w/assist (OT/PT LTG: Patient will perform furniture transfers with assistance (OT/PT).  Downgraded 05/20/15 due to new NWB RLE

## 2015-05-21 ENCOUNTER — Inpatient Hospital Stay (HOSPITAL_COMMUNITY): Payer: Medicaid Other

## 2015-05-21 DIAGNOSIS — D62 Acute posthemorrhagic anemia: Secondary | ICD-10-CM

## 2015-05-21 DIAGNOSIS — L03114 Cellulitis of left upper limb: Secondary | ICD-10-CM

## 2015-05-21 DIAGNOSIS — R5381 Other malaise: Principal | ICD-10-CM

## 2015-05-21 DIAGNOSIS — G5721 Lesion of femoral nerve, right lower limb: Secondary | ICD-10-CM

## 2015-05-21 DIAGNOSIS — G894 Chronic pain syndrome: Secondary | ICD-10-CM

## 2015-05-21 DIAGNOSIS — A4102 Sepsis due to Methicillin resistant Staphylococcus aureus: Secondary | ICD-10-CM

## 2015-05-21 DIAGNOSIS — S52592A Other fractures of lower end of left radius, initial encounter for closed fracture: Secondary | ICD-10-CM

## 2015-05-21 DIAGNOSIS — M86132 Other acute osteomyelitis, left radius and ulna: Secondary | ICD-10-CM

## 2015-05-21 LAB — PROTIME-INR
INR: 2.22 — AB (ref 0.00–1.49)
Prothrombin Time: 24.4 seconds — ABNORMAL HIGH (ref 11.6–15.2)

## 2015-05-21 NOTE — Progress Notes (Signed)
Physical Therapy Session Note  Patient Details  Name: [name] MRN: [id number] Date of Birth: October 17, 1955  Today's Date: 05/21/2015 PT Individual Time: Session 1: 0950-1100; Session 2: 1400-1500 PT Individual Time Calculation (min): Session 1: 70 min; Session 2: 60 min  Short Term Goals: Week 2:  PT Short Term Goal 1 (Week 2): = LTG which have been downgraded with D/C by end of week  Skilled Therapeutic Interventions/Progress Updates:   Session 1:  Patient in supine performed LE therex consisting of Rt. SLR, bilat hip adduction, bilat hip int rot, bilat hip int rot w/knee press, SAQ, and unilat bridge all x 5-10 reps.  Patient performed supine to sit mod I, then transfer to w/c via squat pivot minguard assist.  Patient propelled wheelchair x 150' supervision with right UE and left LE.  Patient sit to stand min A to PFRW and ambulated x 30' then 20', then 20' with one 90 degree turn NWB on R LE with min A and seated rest breaks in between.  Patient performed car transfer to simulated height of [place] with min guard for pivot w/c to/from car and cues for w/c set up.  Patient wheeled back to room x 160' supervision and assisted with pivot to bed supervision and mod I sit to supine left with all needs in reach.  Session 2:  Patient supine to sit Mod I and transfer to w/c supervision after w/c set up.  Propelled to gym 150' supervision and transferred to mat supervision including w/c set up.  Measured for w/c and discussed other home needs including PFRW, tub seat, 3:1 w/drop arm, hospital bed, HHPT/OT/RN.  Informed SW of needs as well.  Patient requested trip outside so assisted outside in wheelchair and discussed issues with community accessibility along the way.  Patient propelled w/c back to room x 120' and transferred to bed with supervision.  Left with all needs in reach.  Therapy Documentation Precautions:  Precautions Precautions: Fall Precaution Comments: NWB R LE, L  UE Restrictions Weight Bearing Restrictions: Yes LUE Weight Bearing: Non weight bearing RLE Weight Bearing: Non weight bearing Other Position/Activity Restrictions: R wrist Pain: Pain Assessment Faces Pain Scale: Hurts little more Pain Location: Foot Pain Orientation: Right Pain Descriptors / Indicators: Aching Pain Onset: With Activity Pain Intervention(s): Elevated extremity;Repositioned;Rest   See Function Navigator for Current Functional Status.   Therapy/Group: Individual Therapy  Rudi CocoWYNN,CYNDI  Cyndi [place] NorthWynn, South CarolinaPT [phone] 05/21/2015  05/21/2015, 12:54 PM

## 2015-05-21 NOTE — Progress Notes (Signed)
Occupational Therapy Session Note  Patient Details  Name: Craig MunchJimmy F [name] MRN: [id number] Date of Birth: May 23, 1956  Today's Date: 05/21/2015 OT Individual Time: 1100-1200 OT Individual Time Calculation (min): 60 min   Short Term Goals: Week 2:  OT Short Term Goal 1 (Week 2): Pt will complete bathing with setup assist OT Short Term Goal 2 (Week 2): Pt will dress upper body iindependently seated with extra time OT Short Term Goal 3 (Week 2): Pt will don pants using reacher with vc to problem solve OT Short Term Goal 4 (Week 2): Pt will retrieve food from refrigerator using least restrictive adaptive device while maintaining precautions 100% of the time with supervision.  Skilled Therapeutic Interventions/Progress Updates: ADL-retraining at ADL-apartment with focus on tub bench transfer, seated bathing, AE-retraining.   Pt reports plan to use standard tub versus walk-in shower s/p d/c and requests tub bench.  OT presents plan to complete trial use of device and tub as focus of session.   Pt is escorted to apartment and educated on transfer method, safety awareness, and use of AE.   Pt performs transfer as advised and completes bathing/dressing in tub with only setup assist.   OT re-educates pt on use of custom splint while performing transfer and provides modification to splint to reduce pressure at radial border.   Pt confirms need for bench and commode post-discharge (10/29).  Therapy Documentation Precautions:  Precautions Precautions: Fall Precaution Comments: NWB R LE, L UE Restrictions Weight Bearing Restrictions: Yes LUE Weight Bearing: Non weight bearing RLE Weight Bearing: Non weight bearing Other Position/Activity Restrictions: R wrist  Pain: Pain Assessment Faces Pain Scale: Hurts little more Pain Location: Foot Pain Orientation: Right Pain Descriptors / Indicators: Aching Pain Onset: With Activity Pain Intervention(s): Elevated extremity;Repositioned;Rest  See Function  Navigator for Current Functional Status.   Therapy/Group: Individual Therapy  Taylen Wendland 05/21/2015, 3:27 PM

## 2015-05-21 NOTE — Progress Notes (Signed)
Subjective/Complaints: Discussed wound care follow-up with plastic surgeon. Patient states he still has some numbness and tingling in the right thigh but improving. Still has weakness in the right thigh. Currently nonweightbearing right lower extremity due to recent ray amputations digits 1 and 2    Review of systems: Right foot pain. Denies CP, SOB, n/v/d.   Objective: Vital Signs: Blood pressure 104/67, pulse 68, temperature 97.8 F (36.6 C), temperature source Oral, resp. rate 18, height $RemoveBe'6\' 3"'rTtbfFOlO$  (1.905 m), weight 103.602 kg (228 lb 6.4 oz), SpO2 97 %. No results found. Results for orders placed or performed during the hospital encounter of 05/10/15 (from the past 72 hour(s))  Protime-INR     Status: Abnormal   Collection Time: 05/19/15  5:09 AM  Result Value Ref Range   Prothrombin Time 25.7 (H) 11.6 - 15.2 seconds   INR 2.38 (H) 0.00 - 1.49  Protime-INR     Status: Abnormal   Collection Time: 05/20/15  4:35 AM  Result Value Ref Range   Prothrombin Time 22.8 (H) 11.6 - 15.2 seconds   INR 2.02 (H) 0.00 - 1.49  CBC with Differential/Platelet     Status: Abnormal   Collection Time: 05/20/15  4:35 AM  Result Value Ref Range   WBC 8.0 4.0 - 10.5 K/uL   RBC 3.09 (L) 4.22 - 5.81 MIL/uL   Hemoglobin 7.7 (L) 13.0 - 17.0 g/dL   HCT 26.4 (L) 39.0 - 52.0 %   MCV 85.4 78.0 - 100.0 fL   MCH 24.9 (L) 26.0 - 34.0 pg   MCHC 29.2 (L) 30.0 - 36.0 g/dL   RDW 15.9 (H) 11.5 - 15.5 %   Platelets 260 150 - 400 K/uL   Neutrophils Relative % 41 %   Neutro Abs 3.2 1.7 - 7.7 K/uL   Lymphocytes Relative 41 %   Lymphs Abs 3.4 0.7 - 4.0 K/uL   Monocytes Relative 9 %   Monocytes Absolute 0.7 0.1 - 1.0 K/uL   Eosinophils Relative 8 %   Eosinophils Absolute 0.6 0.0 - 0.7 K/uL   Basophils Relative 1 %   Basophils Absolute 0.1 0.0 - 0.1 K/uL  Basic metabolic panel     Status: Abnormal   Collection Time: 05/20/15  4:35 AM  Result Value Ref Range   Sodium 137 135 - 145 mmol/L   Potassium 4.5 3.5 -  5.1 mmol/L   Chloride 100 (L) 101 - 111 mmol/L   CO2 29 22 - 32 mmol/L   Glucose, Bld 104 (H) 65 - 99 mg/dL   BUN 14 6 - 20 mg/dL   Creatinine, Ser 0.83 0.61 - 1.24 mg/dL   Calcium 8.7 (L) 8.9 - 10.3 mg/dL   GFR calc non Af Amer >60 >60 mL/min   GFR calc Af Amer >60 >60 mL/min    Comment: (NOTE) The eGFR has been calculated using the CKD EPI equation. This calculation has not been validated in all clinical situations. eGFR's persistently <60 mL/min signify possible Chronic Kidney Disease.    Anion gap 8 5 - 15  Protime-INR     Status: Abnormal   Collection Time: 05/21/15  6:00 AM  Result Value Ref Range   Prothrombin Time 24.4 (H) 11.6 - 15.2 seconds   INR 2.22 (H) 0.00 - 1.49     Gen: NAD. Vital signs reviewed.  HEENT: Normocephalic, atraumatic. Cardio: RRR and no murmur Resp: CTA B/L and unlabored GI: BS positive and NT, ND . Neuro: Alert/Oriented, Anxious, Abnormal Sensation in left  fingers and RLE Musc/Skel:  Edema of LUE Tenderness along LUE Antigravity strength throughout Skin:   Dressing to Right foot c/d/i. Left wrist has mild erythema dorsal ulnar aspect, also has edema of the fingers.  Assessment/Plan: 1. 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 Physiatrist is providing close team supervision and 24 hour management of active medical problems listed below. Physiatrist and rehab team continue to assess barriers to discharge/monitor patient progress toward functional and medical goals. Will extend rehabilitation stay by 2-3 days due to nonweightbearing status right lower extremity FIM: Function - Bathing Position: Shower Body parts bathed by patient: Right arm, Left arm, Chest, Abdomen, Front perineal area,  Buttocks, Right upper leg, Left upper leg, Right lower leg, Left lower leg Body parts bathed by helper: Back, Right lower leg Assist Level: Supervision or verbal cues  Function- Upper Body Dressing/Undressing What is the patient wearing?: Pull over shirt/dress Pull over shirt/dress - Perfomed by patient: Thread/unthread right sleeve, Thread/unthread left sleeve, Put head through opening, Pull shirt over trunk Pull over shirt/dress - Perfomed by helper: Put head through opening Assist Level: Set up Set up : To obtain clothing/put away Function - Lower Body Dressing/Undressing What is the patient wearing?: Pants, Non-skid slipper socks Position: Standing at sink Pants- Performed by patient: Thread/unthread left pants leg, Pull pants up/down, Fasten/unfasten pants Pants- Performed by helper: Thread/unthread right pants leg Non-skid slipper socks- Performed by patient: Don/doff left sock Non-skid slipper socks- Performed by helper: Don/doff right sock, Don/doff left sock Assist for footwear: Dependant Assist for lower body dressing: Touching or steadying assistance (Pt > 75%)  Function - Toileting Toileting activity did not occur: N/A Toileting steps completed by patient: Adjust clothing prior to toileting, Performs perineal hygiene, Adjust clothing after toileting Toileting steps completed by helper: Adjust clothing prior to toileting, Performs perineal hygiene, Adjust clothing after toileting Toileting Assistive Devices: Grab bar or rail Assist level: Supervision or verbal cues  Function - Air cabin crew transfer activity did not occur: N/A Toilet transfer assistive device: Elevated toilet seat/BSC over toilet, Grab bar Assist level to toilet: Touching or steadying assistance (Pt > 75%) Assist level from toilet: Touching or steadying assistance (Pt > 75%) Assist level to bedside commode (at bedside): Touching or steadying assistance (Pt > 75%) Assist level from bedside commode  (at bedside): Touching or steadying assistance (Pt > 75%)  Function - Chair/bed transfer Chair/bed transfer method: Lateral scoot, Stand pivot Chair/bed transfer assist level: Touching or steadying assistance (Pt > 75%) Chair/bed transfer assistive device: Armrests, Walker Chair/bed transfer details: Verbal cues for precautions/safety  Function - Locomotion: Wheelchair Will patient use wheelchair at discharge?: Yes Type: Manual Max wheelchair distance: 40 Assist Level: No help, No cues, assistive device, takes more than reasonable amount of time Assist Level: No help, No cues, assistive device, takes more than reasonable amount of time Wheel 150 feet activity did not occur: Safety/medical concerns Assist Level: No help, No cues, assistive device, takes more than reasonable amount of time Turns around,maneuvers to table,bed, and toilet,negotiates 3% grade,maneuvers on rugs and over doorsills:  (NT) Function - Locomotion: Ambulation Assistive device: Walker-platform Max distance: 40 Assist level: Supervision or verbal cues Assist level: Touching or steadying assistance (Pt > 75%) Walk 50 feet with 2 turns activity did not occur: Safety/medical concerns Assist level: Touching or  steadying assistance (Pt > 75%) Walk 150 feet activity did not occur: Safety/medical concerns Walk 10 feet on uneven surfaces activity did not occur: Safety/medical concerns  Function - Comprehension Comprehension: Auditory Comprehension assist level: Follows complex conversation/direction with extra time/assistive device  Function - Expression Expression: Verbal Expression assist level: Expresses complex ideas: With no assist  Function - Social Interaction Social Interaction assist level: Interacts appropriately with others with medication or extra time (anti-anxiety, antidepressant).  Function - Problem Solving Problem solving assist level: Solves basic problems with no assist  Function -  Memory Memory assist level: Recognizes or recalls 90% of the time/requires cueing < 10% of the time Patient normally able to recall (first 3 days only): Current season, Location of own room, Staff names and faces, That he or she is in a hospital  Medical Problem List and Plan: 1. 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  DVT Prophylaxis/Anticoagulation: Coumadin for thromboembolism. Monitor for any bleeding episodes  -INR 2.22 on 10/25, no signs of bleeding at the current time 3. Pain Management: MS Contin 90 mg twice a day, oxycodone for breakthrough pain. Monitor with increased mobility, Developing increased nerve pain in RLE, Start low-dose gabapentin, titrate upward  -RLE foot pain improving.  4. Mood: Effexor XR 75 mg daily, Klonopin 0.5 mg 4 times a day as needed 5. Neuropsych: This patient is capable of making decisions on his own behalf. 6. 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   7. Fluids/Electrolytes/Nutrition: Strict I and O's with follow-up chemistries  - hydration appears adequate 8.ID. Presently on Ancef 2 g 3 times a day. ID rec total 12 wks, 9. Tachycardia SVT. Resolved.  Follow-up cardiology services. Continue Lopressor 50 mg twice a day for now, HR 68 this am 10. Constipation. Laxitive  assistance, per pt doing well, large BM this morning, continent 11.  Severe protein malnutrition, alb 1.6- supplement with beneprotein, enc hi protein diet choices 12.  Urinary retention-voiding well 13. Left wrist pain. History of distal radial fracture. X-rays indicate collapse of joint space over the last 6 weeks or so. Per surgery, does not appear to be an acutely septic joint.  Appreciatehand surgery review of MRI. Will need at least 6 weeks of antibiotics, question broaden coverage. Infectious disease rec same abx for addnl 8 weeks per Dr Lenon Curt.  MRI Repeat in about 2 weeks, Dr. Lenon Curt will follow-up as an outpatient. 14. Acute blood loss anemia likely due to most recent surgery LOS (Days) 11 A FACE TO FACE EVALUATION WAS PERFORMED  Tacari Repass E 05/21/2015, 10:02 AM

## 2015-05-21 NOTE — Progress Notes (Signed)
  Plastic Surgery Follow up  1 month post rectus flap to right groin/thigh, over sewing femoral vein  Temp:  [97.7 F (36.5 C)-97.8 F (36.6 C)] 97.8 F (36.6 C) (10/25 0452) Pulse Rate:  [68-75] 68 (10/25 0452) Resp:  [18] 18 (10/25 0452) BP: (104-126)/(67-77) 104/67 mmHg (10/25 0452) SpO2:  [97 %-100 %] 97 % (10/25 0452)   S/p 1st and 2 nd ray amputation on right, anticipate d/c later this week    PE Abdomen soft, scar with few scabs Right groin scar softer, no open area RLE proximal thigh : Wound 100 % granulation no slough, flush with skin edges, contracting, scant drainage  A/P S/p rectus muscle flap to prior abscess cavity over stumps of vessels  Continue silver alginate to proximal thigh wound every other day while in hospital , ok to remove dressing to shower.   Upon discharge, start collagen dressing to right thigh three times per week and cover with foam or dry dressing. Ok to remove dressing to bathe, soap and water ok. Follow up in clinic with me 1-2 weeks post discharge.   Glenna FellowsBrinda Keshara Kiger, MD [facility] ServicesM[facility] Plastic & Reconstructive Surgery [phone]

## 2015-05-21 NOTE — Progress Notes (Signed)
ANTICOAGULATION CONSULT NOTE - Follow Up Consult  Pharmacy Consult for Coumadin Indication: RLE DVT  Allergies  Allergen Reactions  . Imitrex [Sumatriptan] Other (See Comments)    Chest pain, 2004 (tolerates 50 mg prn)  . Verapamil Other (See Comments)    Causes pvc's    Patient Measurements: Height: 6\' 3"  (190.5 cm) Weight: 228 lb 6.4 oz (103.602 kg) IBW/kg (Calculated) : 84.5 Heparin Dosing Weight:   Vital Signs: Temp: 97.8 F (36.6 C) (10/25 0452) Temp Source: Oral (10/25 0452) BP: 104/67 mmHg (10/25 0452) Pulse Rate: 68 (10/25 0452)  Labs:  Recent Labs  05/19/15 0509 05/20/15 0435 05/21/15 0600  HGB  --  7.7*  --   HCT  --  26.4*  --   PLT  --  260  --   LABPROT 25.7* 22.8* 24.4*  INR 2.38* 2.02* 2.22*  CREATININE  --  0.83  --     Estimated Creatinine Clearance: 127.9 mL/min (by C-G formula based on Cr of 0.83).   Medications:  Scheduled:  . atorvastatin  80 mg Oral q1800  .  ceFAZolin (ANCEF) IV  2 g Intravenous Q8H  . docusate sodium  100 mg Oral BID  . feeding supplement (ENSURE ENLIVE)  237 mL Oral BID BM  . flecainide  100 mg Oral Q12H  . gabapentin  100 mg Oral TID  . metoprolol tartrate  75 mg Oral BID  . morphine  90 mg Oral Q12H  . multivitamin with minerals  1 tablet Oral Daily  . naloxegol oxalate  25 mg Oral QAC breakfast  . naproxen  500 mg Oral BID WC  . oxyCODONE  30 mg Oral TID PC & HS  . polyethylene glycol  17 g Oral Daily  . venlafaxine XR  75 mg Oral Q breakfast  . [START ON 05/23/2015] warfarin  5 mg Oral Once per day on Mon Thu  . warfarin  7.5 mg Oral Once per day on Sun Tue Wed Fri Sat  . Warfarin - Pharmacist Dosing Inpatient   Does not apply q1800    Assessment: 59yo male with DVT, on Coumadin 7.5mg  daily x 5mg  MonThur.  He received a total of 7.5mg  yesterday as INR had drifted down some.  Today, INR 2.22 and no bleeding noted.  Hg is ~stable at 7.7 and pltc wnl.  Goal of Therapy:  INR 2-3 Monitor platelets by  anticoagulation protocol: Yes   Plan:  Continue Coumadin 7.5mg  daily x 5mg  MonThur Change protimes to MWF only Monitor for s/s of bleeding  Marisue HumbleKendra Burnette Sautter, PharmD Clinical Pharmacist [facility]

## 2015-05-22 ENCOUNTER — Inpatient Hospital Stay (HOSPITAL_COMMUNITY): Payer: Medicaid Other | Admitting: Physical Therapy

## 2015-05-22 ENCOUNTER — Inpatient Hospital Stay (HOSPITAL_COMMUNITY): Payer: Medicaid Other

## 2015-05-22 LAB — PROTIME-INR
INR: 2.13 — AB (ref 0.00–1.49)
Prothrombin Time: 23.6 seconds — ABNORMAL HIGH (ref 11.6–15.2)

## 2015-05-22 NOTE — Progress Notes (Signed)
Physical Therapy Session Note  Patient Details  Name: Craig MunchJimmy F Ferrelli MRN: [id number] Date of Birth: 11/09/1955  Today's Date: 05/22/2015 PT Individual Time: 0900-1000 PT Individual Time Calculation (min): 60 min   Short Term Goals: Week 2:  PT Short Term Goal 1 (Week 2): = LTG which have been downgraded with D/C by end of week  Skilled Therapeutic Interventions/Progress Updates:   Patient seen for 1:1 session focus of treatment on LE strength, gait endurance, balance.  Patient performed OOB transfer supervision after w/c set up.  Propels mod I with right Ue. L LE.  Performed gait with PFRW x 40' minguard assist w/ w/c following.  Amb another 8420' minguard with one turn to sit on mat.  Patient sit to stand x 5 w/ right UE assist only and minguard for balance, balance standing right UE support only w/ cue for left LE hip and knee full extension over 15-20 sec and work on controlled stand to sit on mat.  Patient supine on mat for LE therex consisting of hip adduction 5 sec hold, R SLR multimodal cues for knee extension, hip IR w/ knee press 5 sec hold, bridging bilat right knee on bolster 3 sec hold all x 5-10 reps.  Patient transferred back to w/c supervision and propelled back to room and left in chair with all needs in reach.  Therapy Documentation Precautions:  Precautions Precautions: Fall Precaution Comments: NWB R LE, L UE Restrictions Weight Bearing Restrictions: Yes LUE Weight Bearing: Non weight bearing RLE Weight Bearing: Non weight bearing Other Position/Activity Restrictions: R wrist General:   Pain: Pain Assessment Pain Score: 7  Pain Type: Surgical pain Pain Location: Leg Pain Orientation: Right Pain Descriptors / Indicators: Throbbing Pain Intervention(s): Repositioned;Rest   See Function Navigator for Current Functional Status.   Therapy/Group: Individual Therapy  Rudi CocoWYNN,CYNDI  Cyndi PenroseWynn, [place] CarolinaPT [phone] 05/22/2015  05/22/2015, 9:48 AM

## 2015-05-22 NOTE — Progress Notes (Signed)
Physical Therapy Session Note  Patient Details  Name: [name] MRN: [id number] Date of Birth: Jul 03, 1956  Today's Date: 05/22/2015 PT Individual Time: 1300-1400 PT Individual Time Calculation (min): 60 min   Short Term Goals: Week 2:  PT Short Term Goal 1 (Week 2): = LTG which have been downgraded with D/C by end of week  Skilled Therapeutic Interventions/Progress Updates:    Pt received up in w/c - agreeable to PT. Pt reports occasional big toe phantom pain, but none currently. Gait Training - see function tab for details. Pt uses a L PFRW and demonstrates good NWB status, hopping forward. Therapeutic Activity - See function tab for details re: sit to stands, bed mobility, and car transfer. TranSit set at medium height to simulate crossover vehicle - pt completes stand-step transfer, directing PT appropriately to move w/c, as needed. Pt completes transfer as if in front passenger seat of car, but PT also educates pt about option of transferring into backseat of car with R leg across seat if not enough leg room in front passenger seat of actual car. Pt verbalizes understanding. Therapeutic Exercise - PT instructs pt in R UE strengthening exercise: lat pull down 15#, seated row 15#, triceps pull downs arm with 10#, chest presses in sit with 10#: 3 x 15 reps each, at cable column. Pt ended in bed, resting with all needs in reach. Continue per PT POC.   Therapy Documentation Precautions:  Precautions Precautions: Fall Precaution Comments: NWB R LE, L UE Restrictions Weight Bearing Restrictions: Yes LUE Weight Bearing: Non weight bearing RLE Weight Bearing: Non weight bearing Other Position/Activity Restrictions: R wrist Pain: Pain Assessment Pain Assessment: 0-10 Pain Score: 8  Pain Type: Surgical pain Pain Location: Foot Pain Orientation: Right Pain Descriptors / Indicators: Aching;Throbbing Pain Onset: On-going Pain Intervention(s): Rest Multiple Pain Sites: No  See Function  Navigator for Current Functional Status.   Therapy/Group: Individual Therapy  Isatu Macinnes M 05/22/2015, 1:12 PM

## 2015-05-22 NOTE — Progress Notes (Signed)
ANTICOAGULATION CONSULT NOTE - Follow Up Consult  Pharmacy Consult for Coumadin Indication: DVT  Allergies  Allergen Reactions  . Imitrex [Sumatriptan] Other (See Comments)    Chest pain, 2004 (tolerates 50 mg prn)  . Verapamil Other (See Comments)    Causes pvc's    Patient Measurements: Height: 6\' 3"  (190.5 cm) Weight: 226 lb 3.1 oz (102.6 kg) IBW/kg (Calculated) : 84.5 Heparin Dosing Weight:   Vital Signs: Temp: 97.8 F (36.6 C) (10/26 0542) Temp Source: Oral (10/26 0542) BP: 99/60 mmHg (10/26 0838) Pulse Rate: 74 (10/26 0838)  Labs:  Recent Labs  05/20/15 0435 05/21/15 0600 05/22/15 0506  HGB 7.7*  --   --   HCT 26.4*  --   --   PLT 260  --   --   LABPROT 22.8* 24.4* 23.6*  INR 2.02* 2.22* 2.13*  CREATININE 0.83  --   --     Estimated Creatinine Clearance: 127.4 mL/min (by C-G formula based on Cr of 0.83).   Medications:  Scheduled:  . atorvastatin  80 mg Oral q1800  .  ceFAZolin (ANCEF) IV  2 g Intravenous Q8H  . docusate sodium  100 mg Oral BID  . feeding supplement (ENSURE ENLIVE)  237 mL Oral BID BM  . flecainide  100 mg Oral Q12H  . gabapentin  100 mg Oral TID  . metoprolol tartrate  75 mg Oral BID  . morphine  90 mg Oral Q12H  . multivitamin with minerals  1 tablet Oral Daily  . naloxegol oxalate  25 mg Oral QAC breakfast  . naproxen  500 mg Oral BID WC  . oxyCODONE  30 mg Oral TID PC & HS  . polyethylene glycol  17 g Oral Daily  . venlafaxine XR  75 mg Oral Q breakfast  . [START ON 05/23/2015] warfarin  5 mg Oral Once per day on Mon Thu  . warfarin  7.5 mg Oral Once per day on Sun Tue Wed Fri Sat  . Warfarin - Pharmacist Dosing Inpatient   Does not apply q1800    Assessment: 59yo male with DVT on Coumadin 7.5mg  daily x 5mg  MonThur.  INR 2.13 this AM and no bleeding noted.  Hg and pltc were stable on 10/24.    Goal of Therapy:  INR 2-3 Monitor platelets by anticoagulation protocol: Yes   Plan:  Continue current dose Check protime  MWF Watch for s/s of bleeding  Marisue HumbleKendra Lerlene Treadwell, PharmD Clinical Pharmacist [facility]

## 2015-05-22 NOTE — Progress Notes (Signed)
Subjective/Complaints: No new issues overnight. Less stiff in the left hand but still has stiffness at the wrist. Phantom toe pain right great toe    Review of systems: Right foot pain. Denies CP, SOB, n/v/d.   Objective: Vital Signs: Blood pressure 99/60, pulse 74, temperature 97.8 F (36.6 C), temperature source Oral, resp. rate 17, height 6' 3" (1.905 m), weight 102.6 kg (226 lb 3.1 oz), SpO2 97 %. No results found. Results for orders placed or performed during the hospital encounter of 05/10/15 (from the past 72 hour(s))  Protime-INR     Status: Abnormal   Collection Time: 05/20/15  4:35 AM  Result Value Ref Range   Prothrombin Time 22.8 (H) 11.6 - 15.2 seconds   INR 2.02 (H) 0.00 - 1.49  CBC with Differential/Platelet     Status: Abnormal   Collection Time: 05/20/15  4:35 AM  Result Value Ref Range   WBC 8.0 4.0 - 10.5 K/uL   RBC 3.09 (L) 4.22 - 5.81 MIL/uL   Hemoglobin 7.7 (L) 13.0 - 17.0 g/dL   HCT 26.4 (L) 39.0 - 52.0 %   MCV 85.4 78.0 - 100.0 fL   MCH 24.9 (L) 26.0 - 34.0 pg   MCHC 29.2 (L) 30.0 - 36.0 g/dL   RDW 15.9 (H) 11.5 - 15.5 %   Platelets 260 150 - 400 K/uL   Neutrophils Relative % 41 %   Neutro Abs 3.2 1.7 - 7.7 K/uL   Lymphocytes Relative 41 %   Lymphs Abs 3.4 0.7 - 4.0 K/uL   Monocytes Relative 9 %   Monocytes Absolute 0.7 0.1 - 1.0 K/uL   Eosinophils Relative 8 %   Eosinophils Absolute 0.6 0.0 - 0.7 K/uL   Basophils Relative 1 %   Basophils Absolute 0.1 0.0 - 0.1 K/uL  Basic metabolic panel     Status: Abnormal   Collection Time: 05/20/15  4:35 AM  Result Value Ref Range   Sodium 137 135 - 145 mmol/L   Potassium 4.5 3.5 - 5.1 mmol/L   Chloride 100 (L) 101 - 111 mmol/L   CO2 29 22 - 32 mmol/L   Glucose, Bld 104 (H) 65 - 99 mg/dL   BUN 14 6 - 20 mg/dL   Creatinine, Ser 0.83 0.61 - 1.24 mg/dL   Calcium 8.7 (L) 8.9 - 10.3 mg/dL   GFR calc non Af Amer >60 >60 mL/min   GFR calc Af Amer >60 >60 mL/min    Comment: (NOTE) The eGFR has been  calculated using the CKD EPI equation. This calculation has not been validated in all clinical situations. eGFR's persistently <60 mL/min signify possible Chronic Kidney Disease.    Anion gap 8 5 - 15  Protime-INR     Status: Abnormal   Collection Time: 05/21/15  6:00 AM  Result Value Ref Range   Prothrombin Time 24.4 (H) 11.6 - 15.2 seconds   INR 2.22 (H) 0.00 - 1.49  Protime-INR     Status: Abnormal   Collection Time: 05/22/15  5:06 AM  Result Value Ref Range   Prothrombin Time 23.6 (H) 11.6 - 15.2 seconds   INR 2.13 (H) 0.00 - 1.49     Gen: NAD. Vital signs reviewed.  HEENT: Normocephalic, atraumatic. Cardio: RRR and no murmur Resp: CTA B/L and unlabored GI: BS positive and NT, ND . Neuro: Alert/Oriented, Anxious, Abnormal Sensation in left fingers and RLE Musc/Skel:  Edema of LUE Tenderness along LUE Antigravity strength throughout Skin:   Dressing to Right  foot c/d/i. Left wrist has mild erythema dorsal ulnar aspect, also has edema of the fingers.  Assessment/Plan: 1. 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 Physiatrist is providing close team supervision and 24 hour management of active medical problems listed below. Physiatrist and rehab team continue to assess barriers to discharge/monitor patient progress toward functional and medical goals. Will extend rehabilitation stay by 2-3 days due to nonweightbearing status right lower extremity FIM: Function - Bathing Position: Other (comment) (standard tub) Body parts bathed by patient: Right arm, Left arm, Abdomen, Chest, Front perineal area, Buttocks, Right upper leg, Left upper leg, Left lower leg Body parts bathed by helper: Back, Right lower leg Bathing not  applicable: Right lower leg Assist Level: Supervision or verbal cues  Function- Upper Body Dressing/Undressing What is the patient wearing?: Pull over shirt/dress Pull over shirt/dress - Perfomed by patient: Thread/unthread right sleeve, Thread/unthread left sleeve, Put head through opening, Pull shirt over trunk Pull over shirt/dress - Perfomed by helper: Put head through opening Assist Level: Set up Set up : To obtain clothing/put away Function - Lower Body Dressing/Undressing What is the patient wearing?: Pants, Non-skid slipper socks Position: Standing at sink Pants- Performed by patient: Thread/unthread right pants leg, Thread/unthread left pants leg, Pull pants up/down, Fasten/unfasten pants Pants- Performed by helper: Thread/unthread right pants leg Non-skid slipper socks- Performed by patient: Don/doff left sock Non-skid slipper socks- Performed by helper: Don/doff right sock, Don/doff left sock Assist for footwear: Dependant Assist for lower body dressing: Touching or steadying assistance (Pt > 75%)  Function - Toileting Toileting activity did not occur: N/A Toileting steps completed by patient: Performs perineal hygiene, Adjust clothing after toileting, Adjust clothing prior to toileting Toileting steps completed by helper: Adjust clothing prior to toileting, Performs perineal hygiene, Adjust clothing after toileting Toileting Assistive Devices: Grab bar or rail Assist level: Supervision or verbal cues  Function - Air cabin crew transfer activity did not occur: N/A Toilet transfer assistive device: Elevated toilet seat/BSC over toilet, Grab bar Assist level to toilet: Touching or steadying assistance (Pt > 75%) Assist level from toilet: Touching or steadying assistance (Pt > 75%) Assist level to bedside commode (at bedside): Touching or steadying assistance (Pt > 75%) Assist level from bedside commode (at bedside): Touching or steadying assistance (Pt >  75%)  Function - Chair/bed transfer Chair/bed transfer method: Lateral scoot Chair/bed transfer assist level: Touching or steadying assistance (Pt > 75%) Chair/bed transfer assistive device: Armrests Chair/bed transfer details: Verbal cues for safe use of DME/AE, Verbal cues for technique  Function - Locomotion: Wheelchair Will patient use wheelchair at discharge?: Yes Type: Manual Max wheelchair distance: 200 Assist Level: No help, No cues, assistive device, takes more than reasonable amount of time Assist Level: No help, No cues, assistive device, takes more than reasonable amount of time Wheel 150 feet activity did not occur: Safety/medical concerns Assist Level: No help, No cues, assistive device, takes more than reasonable amount of time Turns around,maneuvers to table,bed, and toilet,negotiates 3% grade,maneuvers on rugs and over doorsills: No Function - Locomotion: Ambulation Assistive device: Walker-platform Max distance: 30 Assist level: Touching or steadying assistance (Pt > 75%) Assist level: Touching or steadying assistance (Pt > 75%) Walk 50 feet with 2 turns activity did not occur: Safety/medical concerns Assist level: Touching or steadying assistance (Pt >  75%) Walk 150 feet activity did not occur: Safety/medical concerns Walk 10 feet on uneven surfaces activity did not occur: Safety/medical concerns  Function - Comprehension Comprehension: Auditory Comprehension assist level: Follows basic conversation/direction with extra time/assistive device  Function - Expression Expression: Verbal Expression assist level: Expresses basic needs/ideas: With no assist  Function - Social Interaction Social Interaction assist level: Interacts appropriately with others with medication or extra time (anti-anxiety, antidepressant).  Function - Problem Solving Problem solving assist level: Solves basic problems with no assist  Function - Memory Memory assist level: Recognizes or  recalls 90% of the time/requires cueing < 10% of the time Patient normally able to recall (first 3 days only): Current season, Location of own room, Staff names and faces, That he or she is in a hospital  Medical Problem List and Plan: 1. 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  DVT Prophylaxis/Anticoagulation: Coumadin for thromboembolism. Monitor for any bleeding episodes  -INR 2.22 on 10/25, no signs of bleeding at the current time 3. Pain Management: MS Contin 90 mg twice a day, oxycodone for breakthrough pain. Monitor with increased mobility, Developing increased nerve pain in RLE, Start low-dose gabapentin, titrate upward  -RLE foot pain improving.  4. Mood: Effexor XR 75 mg daily, Klonopin 0.5 mg 4 times a day as needed 5. Neuropsych: This patient is capable of making decisions on his own behalf. 6. 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   7. Fluids/Electrolytes/Nutrition: Strict I and O's with follow-up chemistries  - hydration appears adequate 8.ID. Presently on Ancef 2 g 3 times a day. ID rec total 12 wks,ppatient will discharge home on the current dose and follow-up with infectious disease in 4 weeks. 9. Tachycardia SVT. Resolved.  Follow-up cardiology services. Continue Lopressor 50 mg twice a day for now, HR 68 this am 10. Constipation. Laxitive  assistance, per pt doing well, large BM this morning, continent 11.  Severe protein malnutrition, alb 1.6- supplement with beneprotein, enc hi protein diet choices 12.  Urinary retention-voiding well 13. Left wrist pain. History of distal radial fracture. X-rays indicate collapse of joint space over the last 6 weeks or so. Per  surgery, does not appear to be an acutely septic joint. Appreciatehand surgery review of MRI. Will need at least 6 weeks of antibiotics, question broaden coverage. Infectious disease rec same abx for addnl 8 weeks per Dr Lenon Curt.  MRI Repeat in about 2 weeks, Dr. Lenon Curt will follow-up as an outpatient. 14. Acute blood loss anemia likely due to most recent surgery LOS (Days) 12 A FACE TO FACE EVALUATION WAS PERFORMED  Ronasia Isola E 05/22/2015, 9:18 AM

## 2015-05-22 NOTE — Patient Instructions (Signed)
Strengthening: Straight Leg Raise (Phase 1)    Tighten muscles on front of right thigh, then lift leg _6___ inches from surface, keeping knee locked.  Repeat _10___ times per set. Do __1-2__ sets per session. Do __1__ sessions per day.  REALLY FOCUS ON KEEPING RIGHT KNEE STRAIGHT!  http://orth.exer.us/614   Copyright  VHI. All rights reserved.  Strengthening: Quadriceps Set    Roll legs in together and squeeze pillow between knees while you tighten muscles on top of thighs by pushing knees down into surface. Hold __5__ seconds. Repeat __10__ times per set. Do __1-2__ sets per session. Do _1___ sessions per day.  http://orth.exer.us/602   Copyright  VHI. All rights reserved.  Bridging    With left knee bent and foot on the bed and right knee on a rolled up pillow, slowly raise buttocks from floor, keeping stomach tight.  Hold 3 seconds Repeat _10___ times per set. Do __1-2__ sets per session. Do __1__ sessions per day.  http://orth.exer.us/1096   Copyright  VHI. All rights reserved.  Knee Extension (Sitting)    Keeping the walking boot on your foot for added weight straighten right knee fully hold 5 seconds, lower slowly. Repeat _10___ times per set. Do __1-2__ sets per session. Do __1__ sessions per day.  http://orth.exer.us/732   Copyright  VHI. All rights reserved.  Hip Hike    Stand with left foot on floor, right leg off floor, knee straight. Extend hip and knee to full extension and hold with right hand on walker x 20 seconds.  Repeat __5__ times per set. Do _1-2___ sets per session. Do __1__ sessions per day.  http://orth.exer.us/692   Copyright  VHI. All rights reserved.

## 2015-05-22 NOTE — Progress Notes (Signed)
Nutrition Follow-up  DOCUMENTATION CODES:   Not applicable  INTERVENTION:   Continue Ensure Enlive po BID, each supplement provides 350 kcal and 20 grams of protein.  Encourage adequate PO intake.   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 He was ultimately diagnoses w/ MSSA bacteremia and underwent multiple debridements of the right thigh/inguinal area and left wrist, to include a muscle flap closure of his extensive R femoral region wound w/ exposed vessels. He was discharged to [facility] on 04/30/2015 but returned to acute hospital after he developed SVT 10/12. Pt d/c'ed back to CIR 10/14.   PROCEDURE (10/21): Right 1st and 2nd Ray Amputation Local tissue rearrangement for wound closure 4 x 8 cm.  Meal completion has been 80-100%. Intake has been adequate. Pt currently has Ensure ordered and has been consuming them most days. RD to continue with orders to aid in wound healing. Post op pain has been improving.   Diet Order:  Diet Heart Room service appropriate?: Yes; Fluid consistency:: Thin  Skin:  Wound (see comment) (Unstageable ulcer R heel, incision R foot)  Last BM:  10/25  Height:   Ht Readings from Last 1 Encounters:  05/10/15 _0  (1.905 m)    Weight:   Wt Readings from Last 1 Encounters:  05/22/15 226 lb 3.1 oz (102.6 kg)    Ideal Body Weight:  89.1 kg  BMI:  Body mass index is 28.27 kg/(m^2).  Estimated Nutritional Needs:   Kcal:  2200-2400  Protein:  115-130 grams  Fluid:  >  2.2 L/day  EDUCATION NEEDS:   No education needs identified at this time  Corrin Parker, MS, RD, LDN Pager # [phone] After hours/ weekend pager # [phone]

## 2015-05-22 NOTE — Progress Notes (Signed)
Occupational Therapy Session Note  Patient Details  Name: Craig MunchJimmy F [name] MRN: [id number] Date of Birth: 1956-01-03  Today's Date: 05/22/2015 OT Individual Time: 1100-1200 OT Individual Time Calculation (min): 60 min    Short Term Goals: Week 2:  OT Short Term Goal 1 (Week 2): Pt will complete bathing with setup assist OT Short Term Goal 2 (Week 2): Pt will dress upper body iindependently seated with extra time OT Short Term Goal 3 (Week 2): Pt will don pants using reacher with vc to problem solve OT Short Term Goal 4 (Week 2): Pt will retrieve food from refrigerator using least restrictive adaptive device while maintaining precautions 100% of the time with supervision.  Skilled Therapeutic Interventions/Progress Updates:    Pt engaged in BADL retraining including bathing at shower level and dressing with sit<>stand from w/c.  Pt performed scoot transfer w/c<>tub bench and complete bathing while seated on bench.  Pt stood with PFRW to pull up pants during dressing tasks.  Pt directed care appropriately.  Pt required more than a reasonable amount of time to complete tasks.  Pt required steady A with transfers and standing to pull up pants.  Focus on activity tolerance, functional transfers, sit<>stand, standing balance, and safety awareness.  Therapy Documentation Precautions:  Precautions Precautions: Fall Precaution Comments: NWB R LE, L UE Restrictions Weight Bearing Restrictions: Yes LUE Weight Bearing: Non weight bearing RLE Weight Bearing: Non weight bearing Other Position/Activity Restrictions: R wrist Pain: Pain Assessment Pain Score: 7  Pain Type: Surgical pain Pain Location: Leg Pain Orientation: Right Pain Descriptors / Indicators: Throbbing Pain Intervention(s): Repositioned;Rest  See Function Navigator for Current Functional Status.   Therapy/Group: Individual Therapy  Rich BraveLanier, Jamicia Haaland Chappell 05/22/2015, 12:22 PM

## 2015-05-22 NOTE — Progress Notes (Signed)
Physical Therapy Session Note  Patient Details  Name: Craig MunchJimmy F [name] MRN: [id number] Date of Birth: [birth year]  Today's Date: 05/22/2015 PT Individual Time: 1500-1600 (30 min treatment, 30 min make up ) PT Individual Time Calculation (min): 60 min   Short Term Goals: Week 2:  PT Short Term Goal 1 (Week 2): = LTG which have been downgraded with D/C by end of week  Skilled Therapeutic Interventions/Progress Updates:    Patient seen with focus of treatment on practicing steps from seated position and on initiating HEP.  Patient in supine squat pivot to chair supervision.  Propelled to/from therapy gym.  Adjustment to leg rest due to loosening of footrest.  Patient problem solved through ideas to access upper floor of home when cleared to shower. Patient SPT w/ PRFW w/c to low bench min guard to S.  Simulated bumping up 4" steps from low bench to mat, then up to platform on mat with cues and supervision.  Also discussed obtaining stool for bumping up to wheelchair once up at the top of the steps.  Patient instructed in written HEP as previously practiced in earlier session today.  Issued with verbal instructions for SLR, LAQ, bridging with right LE on bolster, hip IR with knee press and standing left knee and hip extension.  Patient verbalized understanding.  Propelled back to room and transferred with supervision to bed and positioned with pillows for comfort.  Instructed on use of PRAFO on R foot for hip positioning as well as heel protection.  Left with all needs in reach.  Therapy Documentation Precautions:  Precautions Precautions: Fall Precaution Comments: NWB R LE, L UE Restrictions Weight Bearing Restrictions: Yes LUE Weight Bearing: Non weight bearing RLE Weight Bearing: Non weight bearing Other Position/Activity Restrictions: R wrist Pain: Pain Assessment Pain Assessment: 0-10 Pain Score: 6  Pain Type: Surgical pain Pain Location: Foot Pain Orientation: Right Pain Descriptors /  Indicators: Aching Pain Onset: On-going Pain Intervention(s): Elevated extremity;Repositioned Multiple Pain Sites: No      See Function Navigator for Current Functional Status.   Therapy/Group: Individual Therapy  Rudi CocoWYNN,CYNDI  Cyndi OgdensburgWynn, South CarolinaPT [phone] 05/22/2015  05/22/2015, 4:42 PM

## 2015-05-23 ENCOUNTER — Inpatient Hospital Stay (HOSPITAL_COMMUNITY): Payer: Medicaid Other | Admitting: Occupational Therapy

## 2015-05-23 ENCOUNTER — Inpatient Hospital Stay (HOSPITAL_COMMUNITY): Payer: Medicaid Other

## 2015-05-23 ENCOUNTER — Inpatient Hospital Stay (HOSPITAL_COMMUNITY): Payer: Medicaid Other | Admitting: Physical Therapy

## 2015-05-23 DIAGNOSIS — Z89411 Acquired absence of right great toe: Secondary | ICD-10-CM

## 2015-05-23 NOTE — Progress Notes (Signed)
Subjective/Complaints:  Foot and toe pain overall better.    Review of systems: Right foot pain. Denies CP, SOB, n/v/d.   Objective: Vital Signs: Blood pressure 145/75, pulse 64, temperature 97.9 F (36.6 C), temperature source Oral, resp. rate 17, height 6\' 3"  (1.905 m), weight 102.6 kg (226 lb 3.1 oz), SpO2 97 %. No results found. Results for orders placed or performed during the hospital encounter of 05/10/15 (from the past 72 hour(s))  Protime-INR     Status: Abnormal   Collection Time: 05/21/15  6:00 AM  Result Value Ref Range   Prothrombin Time 24.4 (H) 11.6 - 15.2 seconds   INR 2.22 (H) 0.00 - 1.49  Protime-INR     Status: Abnormal   Collection Time: 05/22/15  5:06 AM  Result Value Ref Range   Prothrombin Time 23.6 (H) 11.6 - 15.2 seconds   INR 2.13 (H) 0.00 - 1.49     Gen: NAD. Vital signs reviewed.  HEENT: Normocephalic, atraumatic. Cardio: RRR and no murmur Resp: CTA B/L and unlabored GI: BS positive and NT, ND . Neuro: Alert/Oriented, Anxious, Abnormal Sensation in left fingers and RLE Musc/Skel:  Edema of LUE Tenderness along LUE Antigravity strength throughout Skin:   Dressing to Right foot c/d/i. Left wrist has mild erythema dorsal ulnar aspect, also has edema of the fingers.  Assessment/Plan: 1. 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 Physiatrist is providing close team supervision and 24 hour management of active medical problems listed below. Physiatrist and rehab team continue to assess barriers to discharge/monitor patient progress toward functional and medical goals. Plan discharge on 05/25/2015 FIM: Function - Bathing Position: Other (comment) (standard tub) Body parts bathed by patient:  Right arm, Left arm, Abdomen, Chest, Front perineal area, Buttocks, Right upper leg, Left upper leg, Left lower leg Body parts bathed by helper: Back, Right lower leg Bathing not applicable: Right lower leg Assist Level: Supervision or verbal cues  Function- Upper Body Dressing/Undressing What is the patient wearing?: Pull over shirt/dress Pull over shirt/dress - Perfomed by patient: Thread/unthread right sleeve, Thread/unthread left sleeve, Put head through opening, Pull shirt over trunk Pull over shirt/dress - Perfomed by helper: Put head through opening Assist Level: Set up Set up : To obtain clothing/put away Function - Lower Body Dressing/Undressing What is the patient wearing?: Pants, Non-skid slipper socks Position: Standing at sink Pants- Performed by patient: Thread/unthread right pants leg, Thread/unthread left pants leg, Pull pants up/down, Fasten/unfasten pants Pants- Performed by helper: Thread/unthread right pants leg Non-skid slipper socks- Performed by patient: Don/doff left sock Non-skid slipper socks- Performed by helper: Don/doff right sock, Don/doff left sock Assist for footwear: Dependant Assist for lower body dressing: Touching or steadying assistance (Pt > 75%)  Function - Toileting Toileting activity did not occur: N/A Toileting steps completed by patient: Adjust clothing prior to toileting, Performs perineal hygiene, Adjust clothing after toileting Toileting steps completed by helper: Adjust clothing prior to toileting, Performs perineal hygiene, Adjust clothing after toileting Toileting Assistive Devices: Grab bar or rail Assist level: Supervision or verbal cues  Function - ArchivistToilet Transfers Toilet transfer activity did not occur: N/A Toilet transfer assistive device: Elevated toilet seat/BSC over toilet Assist level to toilet: Touching or steadying assistance (Pt > 75%) Assist level from toilet: Touching or steadying assistance (Pt > 75%) Assist level  to  bedside commode (at bedside): Touching or steadying assistance (Pt > 75%) Assist level from bedside commode (at bedside): Touching or steadying assistance (Pt > 75%)  Function - Chair/bed transfer Chair/bed transfer method: Stand pivot Chair/bed transfer assist level: Supervision or verbal cues Chair/bed transfer assistive device: Armrests, Environmental consultant, Other (platform attachment) Chair/bed transfer details: Visual cues for safe use of DME/AE, Visual cues/gestures for precautions/safety  Function - Locomotion: Wheelchair Will patient use wheelchair at discharge?: Yes Type: Manual Max wheelchair distance: 150' Assist Level: No help, No cues, assistive device, takes more than reasonable amount of time Assist Level: No help, No cues, assistive device, takes more than reasonable amount of time Wheel 150 feet activity did not occur: Safety/medical concerns Assist Level: No help, No cues, assistive device, takes more than reasonable amount of time Turns around,maneuvers to table,bed, and toilet,negotiates 3% grade,maneuvers on rugs and over doorsills: No Function - Locomotion: Ambulation Assistive device: Walker-platform Max distance: 34' + 20' Assist level: Supervision or verbal cues Assist level: Supervision or verbal cues Walk 50 feet with 2 turns activity did not occur: Safety/medical concerns Assist level: Touching or steadying assistance (Pt > 75%) Walk 150 feet activity did not occur: Safety/medical concerns Walk 10 feet on uneven surfaces activity did not occur: Safety/medical concerns  Function - Comprehension Comprehension: Auditory Comprehension assist level: Understands complex 90% of the time/cues 10% of the time  Function - Expression Expression: Verbal Expression assist level: Expresses complex 90% of the time/cues < 10% of the time  Function - Social Interaction Social Interaction assist level: Interacts appropriately 90% of the time - Needs monitoring or encouragement for  participation or interaction.  Function - Problem Solving Problem solving assist level: Solves basic 90% of the time/requires cueing < 10% of the time  Function - Memory Memory assist level: Recognizes or recalls 90% of the time/requires cueing < 10% of the time Patient normally able to recall (first 3 days only): Current season, Location of own room, Staff names and faces, That he or she is in a hospital  Medical Problem List and Plan: 1. 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  DVT Prophylaxis/Anticoagulation: Coumadin for thromboembolism. Monitor for any bleeding episodes  -INR 2.22 on 10/25, no signs of bleeding at the current time 3. Pain Management: MS Contin 90 mg twice a day, oxycodone for breakthrough pain. Monitor with increased mobility, Developing increased nerve pain in RLE, Start low-dose gabapentin, titrate upward  -RLE foot pain improving.  4. Mood: Effexor XR 75 mg daily, Klonopin 0.5 mg 4 times a day as needed 5. Neuropsych: This patient is capable of making decisions on his own behalf. 6. 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   7. Fluids/Electrolytes/Nutrition: Strict I and O's with follow-up chemistries  - hydration appears adequate 8.ID. Presently on Ancef 2 g 3 times a day. ID rec total 12 wks,ppatient will discharge home on the current dose and follow-up with infectious disease in 4 weeks. 9. Tachycardia SVT. Resolved.  Follow-up cardiology services. Continue Lopressor 50 mg twice a day for now, HR 68 this am 10. Constipation. Laxitive  assistance, per pt doing well, large BM this morning, continent 11.  Severe protein  malnutrition, alb 1.6- supplement with beneprotein, enc  hi protein diet choices 12.  Urinary retention-voiding well 13. Left wrist pain. History of distal radial fracture. X-rays indicate collapse of joint space over the last 6 weeks or so. Per surgery, does not appear to be an acutely septic joint. Appreciatehand surgery review of MRI. Will need at least 6 weeks of antibiotics, question broaden coverage. Infectious disease rec same abx for addnl 8 weeks per Dr [name].  MRI Repeat in about 2 weeks, [name] will follow-up as an outpatient. 14. Acute blood loss anemia likely due to most recent surgery LOS (Days) 13 A FACE TO FACE EVALUATION WAS PERFORMED  Alyus Mofield E 05/23/2015, 3:19 PM

## 2015-05-23 NOTE — Progress Notes (Signed)
Physical Therapy Session Note  Patient Details  Name: Craig MunchJimmy F [name] MRN: [id number] Date of Birth: 1956-02-16  Today's Date: 05/23/2015 PT Individual Time: 1330-1400 Treatment Session 2: 1500-1600 PT Individual Time Calculation (min): 30 min Treatment Session 2: 60 min  Short Term Goals: Week 2:  PT Short Term Goal 1 (Week 2): = LTG which have been downgraded with D/C by end of week  Skilled Therapeutic Interventions/Progress Updates:    Treatment Session 1: Pt received up in w/c with new w/c and walker equipment delivered, but not yet fit to pt. W/C Management - No w/c cushion has yet been delivered. PT adjusts pt's legrest length to accommodate pt's R CAM boot and ensure comfort in w/c. Gait Training - PT also adjusted pt's RW and L platform attachment to pt's comfort, and pt ambulates - see function tab for details. Pt reports that L knee is hitting platform attachment bracket and so PT asks SW to exchange currect RW for wide RW and she agrees to do this. Pt ended up in new w/c with all needs in reach to assess comfort of w/c.   Treatment Session 2: Pt received up in w/c, agreeable to second session of PT. W/C management - see functional tab for details. Pt uses R arm and L leg to self propel on level surface, as well as up/down a low ramp. Therapeutic Exercise - CAM boot to R foot for protection and weight challenge; 4# ankle weight to L leg: LAQ with 5 second holds, heel slides, knee extension with sheet around foot for resistance x 5 second holds, : 2 x 10 reps each. Pt ended in bed with all needs in reach - CAM boot to R foot [facility]. Pt's B LE strength and safety with functional mobility are continuing to improve. Continue per PT POC.    Therapy Documentation Precautions:  Precautions Precautions: Fall Precaution Comments: NWB R LE, L UE Restrictions Weight Bearing Restrictions: Yes LUE Weight Bearing: Non weight bearing RLE Weight Bearing: Non weight bearing Other  Position/Activity Restrictions: R wrist Pain: Pain Assessment Pain Assessment: 0-10 Pain Score: 8  Pain Type: Acute pain;Surgical pain Pain Location: Foot Pain Orientation: Right Pain Descriptors / Indicators: Aching;Throbbing Pain Onset: On-going Pain Intervention(s): Emotional support;Rest Multiple Pain Sites: No Treatment Session 2: Pt reports 9/10 pain overall in B knees, R foot and L wrist. PT uses distraction, rest, and emotional support to decrease his pain.    See Function Navigator for Current Functional Status.   Therapy/Group: Individual Therapy  Zhoe Catania M 05/23/2015, 2:30 PM

## 2015-05-23 NOTE — Progress Notes (Signed)
Social Work Patient ID: [name], male   DOB: Dec 06, 1955, 59 y.o.   MRN: [id number]   CSW spoke with pt and later to his wife via telephone to update them on team conference discussion.  Pt is doing well and will be ready for d/c on 05-25-15.  Wife came in today for training on IV antibiotics with Jeri ModenaPam Chandler, IV RN for [facility].  Pam stated that wife did well with the training.  Wife will come in on 05-24-15 for family education with therapists.  DME was ordered and w/c and walker were delivered to the room, but the hospital bed, commode, tub bench to be delivered to the home.  Pt's family is having a ramp installed prior to d/c.  Pt will not have therapies at home and so therapists are giving him a home exercise program.  [facility].  Pt was very appreciative of the care he has received on Rehab.  CSW remains available to assist as needs arise.

## 2015-05-23 NOTE — Progress Notes (Signed)
Orthopedic Tech Progress Note Patient Details:  Craig MunchJimmy F [name] 1956-01-01 [id number]  Patient ID: [name], male   DOB: 1956-01-01, 59 y.o.   MRN: [id number]   Craig FordyceJennifer C Oluwanifemi [name] 05/23/2015, 9:36 AMCalled hanger for right off loading shoe.

## 2015-05-23 NOTE — Patient Care Conference (Signed)
Inpatient RehabilitationTeam Conference and Plan of Care Update Date: 05/22/2015   Time: 2:05 PM    Patient Name: Craig MunchJimmy F [name]      Medical Record Number: [id number]  Date of Birth: 09-Sep-1955 Sex: Male         Room/Bed: 4W08C/4W08C-01 Payor Info: Payor: MEDICAID [place] / Plan: MEDICAID [place] ACCESS / Product Type: *No Product type* /    Admitting Diagnosis: sepsis Gangrene Right Great Toe and 2nd Toe  Admit Date/Time:  05/10/2015  3:58 PM Admission Comments: No comment available   Primary Diagnosis:  Debility Principal Problem: Debility  Patient Active Problem List   Diagnosis Date Noted  . Status post amputation of great toe (HCC)   . Pain   . Swelling   . Warmth of joint   . Mycotic aneurysm   . Thigh abscess   . Hand abscess   . Septic embolism (HCC)   . Gangrene of toe (HCC)   . Acute osteomyelitis of left ulna (HCC)   . Streptococcal arthritis of left wrist (HCC)   . Dry gangrene (HCC)   . Wound, open, foot with complication   . Acute deep vein thrombosis (DVT) of distal end of right lower extremity (HCC)   . Left wrist fracture   . Abscess of bursa, left wrist   . SVT (supraventricular tachycardia) (HCC) 05/08/2015  . 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 Adjustment disorder with depressed mood   . 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 Debility 05/01/2015  . Sepsis (HCC) 04/30/2015  . Acute renal failure with tubular necrosis (HCC)   . Abscess of left hand   . Staphylococcus aureus bacteremia with sepsis (HCC)   . Colles' fracture of left radius   . Cellulitis of left upper extremity   . Diastolic dysfunction   . Nondisplaced fracture of distal end of left radius   . Abscess   . Bacteremia due to Staphylococcus   . Pulmonary hypertension (HCC)   . Acute blood loss anemia   . Aneurysm of right femoral artery (HCC)   . DVT, lower extremity (HCC)   . Radius distal fracture   . Cellulitis of finger of left hand   . Acute pyelonephritis    . Arm pain, anterior   . Cellulitis   . Bacteremia due to Staphylococcus aureus   . Acute renal failure syndrome (HCC)   . Fracture of left radius   . ARF (acute renal failure) (HCC) 04/07/2015  . Severe sepsis (HCC) 04/07/2015  . Cellulitis of left hand 04/07/2015  . Elevated INR 04/07/2015  . Foot swelling 04/07/2015  . Elevated bilirubin 04/07/2015  . Hypokalemia 04/07/2015  . Hypomagnesemia 04/07/2015  . 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 Bacteremia 04/07/2015  . UTI (lower urinary tract infection)   . Chest pain 11/15/2013  . History of duodenal ulcer 11/15/2013  . 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 Septic shock due to abscess 07/13/2013  . Chronic use of opiate drugs therapeutic purposes 07/12/2013  . Acute renal failure (HCC) 07/11/2013  . Chronic pain syndrome 07/11/2013  . Testosterone deficiency 07/11/2013    Expected Discharge Date: Expected Discharge Date: 05/25/15  Team Members Present: Physician leading conference: Dr. Maryla MorrowAnkit Patel Social Worker Present: Staci AcostaJenny Macrina Lehnert, LCSW Nurse Present: Carmie EndAngie Joyce, RN PT Present: Laurin CoderMandy Hyslop, PT OT Present: Roney MansJennifer Smith, OT PPS Coordinator present : Tora DuckMarie Noel, RN, CRRN     Current Status/Progress Goal Weekly Team Focus  Medical   Debility s/p sepsis  with pain issue, new righ 1st and 2nd digit ray amputation with phantom toe pain  Education for transfers, improve pain  see above   Bowel/Bladder   Continent to bowel and bladder.LBM 10/25.  To continue continent to B & B with min. assisst.  Assess B & B function Q shift and PRN.Rt. foot with post surgi   Swallow/Nutrition/ Hydration             ADL's   Supervision for transfers, setup for UB BADL, Min A for lower body BADL  Mod I for BADL (Min A only for management of R foot).  Transfers, standing balance, AE retraining,  adherenece to precatuions.   Mobility   SBA overall  Mod I overall transfers and w/c mobility, supervision gait   LE strengthening &  ROM, transfers, gait, standing balance, activity tolerance/endurance, maintaining NWB status to R LE   Communication             Safety/Cognition/ Behavioral Observations            Pain   Continue using MS contin schedule,Oxi schedule,and PRN Robaxin and OXi IR.  Assess and treat for pain Q 2-3 hrs. and PRN      Skin   Rt. foot with post surgical dressing on.Rt  thigh dressing changes Q daily.  No further breakdown or infection with mod. assisst.  Assess skin Q shift and PRN,performe dressing changes as per orders.    Rehab Goals Patient on target to meet rehab goals: Yes Rehab Goals Revised: none *See Care Plan and progress notes for long and short-term goals.  Barriers to Discharge: Phantom pain, transfers, safety    Possible Resolutions to Barriers:  Education, optimize phantom pain     Discharge Planning/Teaching Needs:  Pt to return to his home where his wife and two adult sons live and they will be with him 24/7.  Family is having a ramp installed.  Pt's wife to come for family education with [facility] RN for IV antibiotics training and with PT and OT for training prior to d/c.   Team Discussion:  Pt is doing well in PT, even with more medical complications.  Pt is also progressing with OT and doing lateral scoots.  Homero Fellers, OT, made him a splint for his left hand which is better than one ordered for him.  RN and therapists to train wife prior to d/c.  Revisions to Treatment Plan:  none   Continued Need for Acute Rehabilitation Level of Care: The patient requires daily medical management by a physician with specialized training in physical medicine and rehabilitation for the following conditions: Daily direction of a multidisciplinary physical rehabilitation program to ensure safe treatment while eliciting the highest outcome that is of practical value to the patient.: Yes Daily medical management of patient stability for increased activity during participation in an  intensive rehabilitation regime.: Yes Daily analysis of laboratory values and/or radiology reports with any subsequent need for medication adjustment of medical intervention for : Post surgical problems  Vanna Sailer, [place]ck 05/23/2015, 9:49 PM

## 2015-05-23 NOTE — Progress Notes (Signed)
[facility] Patient Status: [facility]  [facility] is providing the following services: HHRN and Home Infusion Pharmacy team for home IV ABX.  Hospital Infusion Coordinator will provide in hospital teaching with wife regarding IV ABX set up and administration to support independence at home.   [facility] will follow pt to support transition home when ordered.  If patient discharges after hours, please call [phone].   Sedalia Mutaamela S Chandler 05/23/2015, 9:58 AM

## 2015-05-23 NOTE — Progress Notes (Signed)
Physical Therapy Session Note  Patient Details  Name: [name] MRN: [id number] Date of Birth: 13-Apr-1956  Today's Date: 05/23/2015 PT Individual Time: 0900-1000 PT Individual Time Calculation (min): 60 min   Short Term Goals: Week 2:  PT Short Term Goal 1 (Week 2): = LTG which have been downgraded with D/C by end of week  Skilled Therapeutic Interventions/Progress Updates: Pt donned L hand splint with assistance for liner. PT donned R boot.  Gait with RPFRW x 28'x 35' with close superviison, PT pushing IV pole, observing restrictions without cues.  Core strengthening with reciprocal scooting forward/backward, and pelvic tilts with bil shoulder adduction, trunk extension with 5 second hold at end range.    Therapeutic activity - reaching out of BOS with L and R hands for objects below knees and above head to facilitate trunk shortening/lengthening/rotating.  PT returned pt to room in w/c; all needs left within reach and family present. Therapy Documentation Precautions:  Precautions Precautions: Fall Precaution Comments: NWB R LE, L UE Restrictions Weight Bearing Restrictions: Yes LUE Weight Bearing: Non weight bearing RLE Weight Bearing: Non weight bearing Other Position/Activity Restrictions: R wrist   Pain: Pain Assessment Pain Assessment: 0-10 Pain Score: 8  Pain Type: Acute pain Pain Orientation: Right knee Pain Intervention(s): Medication (See eMAR) Multiple Pain Sites: No        See Function Navigator for Current Functional Status.   Therapy/Group: Individual Therapy  Eward Rutigliano 05/23/2015, 4:28 PM

## 2015-05-23 NOTE — Progress Notes (Signed)
Occupational Therapy Session Note  Patient Details  Name: KENAN MOODIE MRN: [id number] Date of Birth: [birth year]  Today's Date: 05/23/2015 OT Individual Time: 4098-1191 OT Individual Time Calculation (min): 59 min    Short Term Goals: Week 1:  OT Short Term Goal 1 (Week 1): Pt will complete stand pivot transfer to [facility] with supervision OT Short Term Goal 1 - Progress (Week 1): Met OT Short Term Goal 2 (Week 1): Pt will dress don underwear/ pants with steadying assist.  OT Short Term Goal 2 - Progress (Week 1): Met OT Short Term Goal 3 (Week 1): Pt will complete all grooming tasks from seated level mod I OT Short Term Goal 3 - Progress (Week 1): Met OT Short Term Goal 4 (Week 1): Pt will demonstrate carryover of education of self-care skills with min questioning cues OT Short Term Goal 4 - Progress (Week 1): Met OT Short Term Goal 5 - Progress (Week 1): Met Week 2:  OT Short Term Goal 1 (Week 2): Pt will complete bathing with setup assist OT Short Term Goal 2 (Week 2): Pt will dress upper body iindependently seated with extra time OT Short Term Goal 3 (Week 2): Pt will don pants using reacher with vc to problem solve OT Short Term Goal 4 (Week 2): Pt will retrieve food from refrigerator using least restrictive adaptive device while maintaining precautions 100% of the time with supervision.     Skilled Therapeutic Interventions/Progress Updates:    Pt seen this session to review discharge plans and progress. Pt stated that he needs to continue to practice side stepping with PFRW, but his L leg was very fatigued from prior PT session. Pt concerned with edema in L hand. Pt worked on AROM of fingers, slight active wrist ROM within pain tolerance, retrograde massage,  Coban wrapped each finger.  Pt then engaged in AROM of LUE with sh flexion to 160 and active A using R arm to reach 180, then no assist for controlled eccentric lowering of arm. 5 reps, 4 sets. L Sh flexion with active overhead elbow  ext 10 reps, 2 sets. Pt then used 5 lb dumbbell for R shoulder presses, elbow flexion and tricep extension for 10-15 reps each. Education with pt on need to work on these exercises daily to maintain strength in arms and to assist with ease of transfers.   Pt's lunch tray arrived and pt with all needs met.    Therapy Documentation Precautions:  Precautions Precautions: Fall Precaution Comments: NWB R LE, L UE Restrictions Weight Bearing Restrictions: Yes LUE Weight Bearing: Non weight bearing RLE Weight Bearing: Non weight bearing Other Position/Activity Restrictions: R wrist  Pain: Pain Assessment Pain Score: 8  Pain Location: Knee Pain Orientation: Right (also L) Pain Intervention(s): Medication (See eMAR) ADL:  See Function Navigator for Current Functional Status.   Therapy/Group: Individual Therapy  Janalynn Eder 05/23/2015, 12:51 PM

## 2015-05-24 ENCOUNTER — Inpatient Hospital Stay (HOSPITAL_COMMUNITY): Payer: Medicaid Other | Admitting: Physical Therapy

## 2015-05-24 ENCOUNTER — Encounter (HOSPITAL_COMMUNITY): Payer: Medicaid Other | Admitting: Occupational Therapy

## 2015-05-24 LAB — PROTIME-INR
INR: 2.26 — AB (ref 0.00–1.49)
PROTHROMBIN TIME: 24.7 s — AB (ref 11.6–15.2)

## 2015-05-24 MED ORDER — GABAPENTIN 100 MG PO CAPS: 100.0000 mg | ORAL_CAPSULE | Freq: Three times a day (TID) | ORAL | Status: DC

## 2015-05-24 MED ORDER — POLYETHYLENE GLYCOL 3350 17 G PO PACK: 17.0000 g | PACK | Freq: Every day | ORAL | Status: DC

## 2015-05-24 MED ORDER — METHOCARBAMOL 500 MG PO TABS: 500.0000 mg | ORAL_TABLET | Freq: Four times a day (QID) | ORAL | Status: DC | PRN

## 2015-05-24 MED ORDER — ATORVASTATIN CALCIUM 80 MG PO TABS: 80.0000 mg | ORAL_TABLET | Freq: Every day | ORAL | Status: DC

## 2015-05-24 MED ORDER — OXYCODONE-ACETAMINOPHEN 5-325 MG PO TABS: 2.0000 | ORAL_TABLET | Freq: Four times a day (QID) | ORAL | Status: DC | PRN

## 2015-05-24 MED ORDER — FLECAINIDE ACETATE 100 MG PO TABS: 100.0000 mg | ORAL_TABLET | Freq: Two times a day (BID) | ORAL | Status: DC

## 2015-05-24 MED ORDER — WARFARIN SODIUM 5 MG PO TABS: 5.0000 mg | ORAL_TABLET | Freq: Every day | ORAL | Status: DC

## 2015-05-24 MED ORDER — DOCUSATE SODIUM 100 MG PO CAPS: 100.0000 mg | ORAL_CAPSULE | Freq: Two times a day (BID) | ORAL | Status: DC

## 2015-05-24 MED ORDER — CLONAZEPAM 2 MG PO TABS: 2.0000 mg | ORAL_TABLET | Freq: Two times a day (BID) | ORAL | Status: DC | PRN

## 2015-05-24 MED ORDER — CEFAZOLIN SODIUM-DEXTROSE 2-3 GM-% IV SOLR: INTRAVENOUS | Status: DC

## 2015-05-24 MED ORDER — METOPROLOL TARTRATE 75 MG PO TABS: 75.0000 mg | ORAL_TABLET | Freq: Two times a day (BID) | ORAL | Status: DC

## 2015-05-24 MED ORDER — WARFARIN SODIUM 5 MG PO TABS: ORAL_TABLET | ORAL | Status: DC

## 2015-05-24 MED ORDER — MORPHINE SULFATE ER 30 MG PO TBCR: 90.0000 mg | EXTENDED_RELEASE_TABLET | Freq: Two times a day (BID) | ORAL | Status: DC

## 2015-05-24 MED ORDER — VENLAFAXINE HCL ER 75 MG PO CP24: 75.0000 mg | ORAL_CAPSULE | Freq: Every day | ORAL | Status: DC

## 2015-05-24 MED ORDER — OXYCODONE HCL 30 MG PO TABS: 30.0000 mg | ORAL_TABLET | Freq: Four times a day (QID) | ORAL | Status: DC

## 2015-05-24 MED ORDER — NAPROXEN 500 MG PO TABS: 500.0000 mg | ORAL_TABLET | Freq: Two times a day (BID) | ORAL | Status: DC

## 2015-05-24 MED ORDER — ADULT MULTIVITAMIN W/MINERALS CH: 1.0000 | ORAL_TABLET | Freq: Every day | ORAL | Status: DC

## 2015-05-24 MED ORDER — NALOXEGOL OXALATE 25 MG PO TABS: 25.0000 mg | ORAL_TABLET | Freq: Every day | ORAL | Status: AC

## 2015-05-24 NOTE — Progress Notes (Addendum)
Subjective/Complaints:  No severe foot pain    Review of systems: Right foot pain. Denies CP, SOB, n/v/d.   Objective: Vital Signs: Blood pressure 95/65, pulse 77, temperature 98.3 F (36.8 C), temperature source Oral, resp. rate 16, height  (1.905 m), weight 102.6 kg (226 lb 3.1 oz), SpO2 98 %. No results found. Results for orders placed or performed during the hospital encounter of 05/10/15 (from the past 72 hour(s))  Protime-INR     Status: Abnormal   Collection Time: 05/22/15  5:06 AM  Result Value Ref Range   Prothrombin Time 23.6 (H) 11.6 - 15.2 seconds   INR 2.13 (H) 0.00 - 1.49  Protime-INR     Status: Abnormal   Collection Time: 05/24/15  5:10 AM  Result Value Ref Range   Prothrombin Time 24.7 (H) 11.6 - 15.2 seconds   INR 2.26 (H) 0.00 - 1.49     Gen: NAD. Vital signs reviewed.  HEENT: Normocephalic, atraumatic. Cardio: RRR and no murmur Resp: CTA B/L and unlabored GI: BS positive and NT, ND . Neuro: Alert/Oriented, Anxious, Abnormal Sensation in left fingers and RLE Musc/Skel:  Edema of LUE Tenderness along LUE Antigravity strength throughout Skin:   Dressing to Right foot c/d/i. Left wrist has mild erythema dorsal ulnar aspect, also has edema of the fingers.  Assessment/Plan: 1. 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  Plan discharge on 05/25/2015 FIM: Function - Bathing Position: Shower Body parts bathed by patient: Right arm, Left arm, Chest, Abdomen, Front perineal area, Buttocks, Right upper leg, Left upper leg, Left lower leg, Back (right LE wrapped to not get wet) Body parts bathed by helper: Back, Right lower leg Bathing not applicable: Right lower leg Assist Level: More than reasonable time  Function- Upper Body Dressing/Undressing What is the patient wearing?: Pull over  shirt/dress Pull over shirt/dress - Perfomed by patient: Thread/unthread right sleeve, Thread/unthread left sleeve, Put head through opening, Pull shirt over trunk Pull over shirt/dress - Perfomed by helper: Put head through opening Assist Level: No help, No cues Set up : To obtain clothing/put away Function - Lower Body Dressing/Undressing What is the patient wearing?: Pants, Shoes (and CAM boot) Position:  (sitting on edge of shower bench) Pants- Performed by patient: Thread/unthread right pants leg, Thread/unthread left pants leg, Pull pants up/down Pants- Performed by helper: Thread/unthread right pants leg Non-skid slipper socks- Performed by patient: Don/doff left sock Non-skid slipper socks- Performed by helper: Don/doff right sock, Don/doff left sock Shoes - Performed by patient: Don/doff left shoe (n/a right shoe) Assist for footwear: Partial/moderate assist (cam boot ) Assist for lower body dressing: Set up  Function - Toileting Toileting activity did not occur: N/A Toileting steps completed by patient: Adjust clothing prior to toileting, Performs perineal hygiene, Adjust clothing after toileting Toileting steps completed by helper: Adjust clothing prior to toileting, Performs perineal hygiene, Adjust clothing after toileting Toileting Assistive Devices: Grab bar or rail Assist level: More than reasonable time  Function - Archivist transfer activity did not occur: N/A Toilet transfer assistive device: Elevated toilet seat/BSC over toilet Assist level to toilet: No Help, no cues, assistive device, takes more than a reasonable amount of time Assist level from toilet: No Help, no cues, assistive device, takes more than a reasonable amount of time Assist level to bedside commode (at bedside): Touching or steadying assistance (Pt > 75%) Assist level from bedside  commode (at bedside): Touching or steadying assistance (Pt > 75%)  Function - Chair/bed transfer Chair/bed  transfer method: Squat pivot, Stand pivot Chair/bed transfer assist level: No Help, no cues, assistive device, takes more than a reasonable amount of time Chair/bed transfer assistive device: Armrests, Walker, Other (L platform attachment) Chair/bed transfer details: Verbal cues for safe use of DME/AE, Verbal cues for technique, Verbal cues for precautions/safety  Function - Locomotion: Wheelchair Will patient use wheelchair at discharge?: Yes Type: Manual Max wheelchair distance: 200' Assist Level: No help, No cues, assistive device, takes more than reasonable amount of time Assist Level: No help, No cues, assistive device, takes more than reasonable amount of time Wheel 150 feet activity did not occur: Safety/medical concerns Assist Level: No help, No cues, assistive device, takes more than reasonable amount of time Turns around,maneuvers to table,bed, and toilet,negotiates 3% grade,maneuvers on rugs and over doorsills: Yes Function - Locomotion: Ambulation Assistive device: Walker-platform Max distance: 64' Assist level: Supervision or verbal cues Assist level: Supervision or verbal cues Walk 50 feet with 2 turns activity did not occur: Safety/medical concerns Assist level: Supervision or verbal cues Walk 150 feet activity did not occur: Safety/medical concerns Walk 10 feet on uneven surfaces activity did not occur: Safety/medical concerns  Function - Comprehension Comprehension: Auditory Comprehension assist level: Understands complex 90% of the time/cues 10% of the time  Function - Expression Expression: Verbal Expression assist level: Expresses complex 90% of the time/cues < 10% of the time  Function - Social Interaction Social Interaction assist level: Interacts appropriately with others with medication or extra time (anti-anxiety, antidepressant).  Function - Problem Solving Problem solving assist level: Solves complex 90% of the time/cues < 10% of the time  Function -  Memory Memory assist level: Recognizes or recalls 90% of the time/requires cueing < 10% of the time Patient normally able to recall (first 3 days only): Current season, Location of own room, Staff names and faces, That he or she is in a hospital  Medical Problem List and Plan: 1. 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  DVT Prophylaxis/Anticoagulation: Coumadin for thromboembolism. Monitor for any bleeding episodes  -INR 2.22 on 10/25, no signs of bleeding at the current time 3. Pain Management: MS Contin 90 mg twice a day, oxycodone for breakthrough pain. Monitor with increased mobility, Developing increased nerve pain in RLE, Start low-dose gabapentin, titrate upward   4. Mood: Effexor XR 75 mg daily, Klonopin 0.5 mg 4 times a day as needed 5. Neuropsych: This patient is capable of making decisions on his own behalf. 6. 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 F/u ortho                                                        7. Fluids/Electrolytes/Nutrition: Strict I and O's with follow-up chemistries  - hydration appears adequate 8.ID. Presently on Ancef 2 g 3 times a day. ID rec total 12 wks,ppatient will discharge home on the current dose and follow-up with infectious disease in 4 weeks. 9. Tachycardia SVT. Resolved.  Follow-up cardiology services. Continue Lopressor 50 mg twice a day for now, HR 68 this am 10. Constipation. Laxitive  assistance, per pt doing well, large BM this morning, continent 11.  Severe protein malnutrition, alb 1.6- supplement with beneprotein, enc hi protein diet choices 12.  Urinary retention-voiding well 13. Left wrist pain.  History of distal radial fracture. X-rays indicate collapse of joint space over the last 6 weeks or so. Per surgery, does not appear to be an acutely septic joint. Appreciatehand surgery review of MRI. Will need at least 6 weeks of antibiotics, question broaden coverage. Infectious disease rec same abx for addnl 8 weeks per Dr Izora Ribasoley.  MRI Repeat in about 2 weeks, Dr. Izora Ribasoley will follow-up as an outpatient. 14. Acute blood loss anemia likely due to most recent surgery LOS (Days) 14 A FACE TO FACE EVALUATION WAS PERFORMED  KIRSTEINS,ANDREW E 05/24/2015, 5:47 PM

## 2015-05-24 NOTE — Progress Notes (Signed)
Dressing was removed as per MD's orders.Incision with sutures on place,no drainage,no odor.New dressing was applied.Vaseline gaze,4 x 4,Kerlix and ace wrap was applied.Keep monitoring closely.

## 2015-05-24 NOTE — Discharge Summary (Signed)
Discharge summary job [id number]

## 2015-05-24 NOTE — Discharge Instructions (Signed)
Inpatient Rehab Discharge Instructions  [name] Discharge date and time: No discharge date for patient encounter.   Activities/Precautions/ Functional Status: Activity: activity as tolerated Diet: regular diet Wound Care: keep wound clean and dry Functional status:  ___ No restrictions     ___ Walk up steps independently ___ 24/7 supervision/assistance   ___ Walk up steps with assistance ___ Intermittent supervision/assistance  ___ Bathe/dress independently ___ Walk with walker     ___ Bathe/dress with assistance ___ Walk Independently    ___ Shower independently _x__ Walk with assistance    ___ Shower with assistance ___ No alcohol     ___ Return to work/school ________  COMMUNITY REFERRALS UPON DISCHARGE:   Home Health:  RN    Agency:  [facility] Phone: [phone] Medical Equipment/Items Ordered:  Drop arm commode; Wide rolling walker with left arm platform; semi-electric hospital bed; tub transfer bench; 20"x20" lightweight w/c - 19" STF with elevating leg rests and swing away arm, antitippers, brake extenders; 20"x20" basic cushion  Agency/Supplier:  [facility]        Phone:  [phone]) [phone] Other: Staci AcostaJenny Prevatt - Social Worker on Rehab - [phone]) [phone]  Special Instructions: Home health nurse to check INR 05/28/2015  [facility] [phone] fax number [phone]   Continue to intravenous Ancef 2 g every 8 hours until 07/05/2015 and stopped   Collagen dressing right thigh 3 times a week, with foam or dry dressing. [facility]   My questions have been answered and I understand these instructions. I will adhere to these goals and the provided educational materials after my discharge from the hospital.  Patient/Caregiver Signature _______________________________ Date __________  Clinician Signature _______________________________________ Date __________  Please bring this form and your medication list with you to all  your follow-up doctor's appointments.

## 2015-05-24 NOTE — Progress Notes (Signed)
Occupational Therapy Daily note and  Discharge Summary  Patient Details  Name: SASAN WILKIE MRN: [id number] Date of Birth: [birth year]  Today's Date: 05/24/2015 OT Individual Time: 0830-1000 OT Individual Time Calculation (min): 90 min  1:1 GRAD DAY with focus on family education with pt and pt's wife. Pt perform tolieting and toilet transfer mod I with platform RW and BSC over the commode. Pt demonstrated bed mobility mod I from air mattress.   Made appropriate adjustments to new w/c and leg rest and adjusting height of platform RW for pt. Transitioned down to ADL apartment for tub bench transfer into shower.  Wife has been education on donning/ doffing CAM boot, safety with functional ambulation,  importance of keeping right foot dry and PICC line dry and how to keep them dry. They were also educate donning wrist brace and maintaining nonweight bearing status on left wrist and right LE.     Patient has met 9 of 9 long term goals due to improved activity tolerance, improved balance, postural control, ability to compensate for deficits and improved coordination.  Patient to discharge at overall mod I for transfers and bed mobility and overall supervision/ setup for bathing and dressing and shower transfers level. Pt can shower at tub shower level with tub bench with supervision for transfer and remains seated for bathing with lateral leans for washing periarea and bottom.  Wife has been education on donning/ doffing CAM boot, importance of keeping right foot dry and PICC line dry and how to keep them dry. They were also educate donning wrist brace and maintaining nonweight bearing status on left wrist and right LE.   Patient's care partner is independent to provide the necessary physical assistance at discharge.    Reasons goals not met: n/a  Recommendation:  Pt recommended to continue his HEP at home. No f/u at this time  Equipment: w/c with elevating leg rest, tub bench, BSC  Reasons for  discharge: treatment goals met and discharge from hospital  Patient/family agrees with progress made and goals achieved: Yes  OT Discharge Precautions/Restrictions  Precautions Precautions: Fall Precaution Comments: NWB R LE & L UE Required Braces or Orthoses: Other Brace/Splint Other Brace/Splint: wrist spling and cam boot on right LE Restrictions Weight Bearing Restrictions: Yes LUE Weight Bearing: Non weight bearing RLE Weight Bearing: Non weight bearing Other Position/Activity Restrictions: R wrist    Vital Signs Therapy Vitals Pulse Rate: 77 BP: 111/67 mmHg Patient Position (if appropriate): Lying Oxygen Therapy SpO2: 99 % Pain Pain Assessment Pain Assessment: 0-10 Pain Score: 8  Pain Type: Acute pain;Surgical pain Pain Location: Foot Pain Orientation: Right Pain Descriptors / Indicators: Aching Pain Onset: On-going Pain Intervention(s): Rest;Emotional support Multiple Pain Sites: No ADL   Vision/Perception  Vision- History Baseline Vision/History: Wears glasses Wears Glasses: Reading only Patient Visual Report: No change from baseline Vision- Assessment Vision Assessment?: No apparent visual deficits Perception Comments: WFL  Cognition Overall Cognitive Status: Within Functional Limits for tasks assessed Arousal/Alertness: Awake/alert Orientation Level: Oriented X4 Attention: Focused;Sustained Focused Attention: Appears intact Sustained Attention: Appears intact Memory: Appears intact Memory Impairment: Decreased recall of new information Awareness: Appears intact Problem Solving: Impaired Problem Solving Impairment: Functional complex Safety/Judgment: Appears intact Sensation Sensation Light Touch: Impaired Detail Light Touch Impaired Details: Impaired RLE;Impaired LUE Stereognosis: Not tested Hot/Cold: Appears Intact Proprioception: Impaired by gross assessment Proprioception Impaired Details: Impaired RLE Motor  Motor Motor: Within  Functional Limits Mobility  Transfers Sit to Stand: 6: Modified independent (Device/Increase time) Stand to  Sit: 6: Modified independent (Device/Increase time)  Trunk/Postural Assessment  Cervical Assessment Cervical Assessment: Within Functional Limits Thoracic Assessment Thoracic Assessment: Within Functional Limits Lumbar Assessment Lumbar Assessment: Within Functional Limits Postural Control Postural Control: Within Functional Limits  Balance Static Sitting Balance Static Sitting - Level of Assistance: 6: Modified independent (Device/Increase time) Dynamic Sitting Balance Dynamic Sitting - Level of Assistance: 6: Modified independent (Device/Increase time) Static Standing Balance Static Standing - Level of Assistance: 6: Modified independent (Device/Increase time) Dynamic Standing Balance Dynamic Standing - Level of Assistance: 5: Stand by assistance Extremity/Trunk Assessment RUE Assessment RUE Assessment: Within Functional Limits LUE AROM (degrees) Overall AROM Left Upper Extremity: Deficits;Due to precautions;Due to pain LUE Overall AROM Comments: shoulder & elbow wfl; finger extension limited 25%; finger flexion limited 25%; decreased scapular mobilization LUE Strength LUE Overall Strength: Deficits;Due to precautions;Due to pain LUE Overall Strength Comments: shoulder not tested due to pain, elbow grossly 4/5; finger 2+/5   See Function Navigator for Current Functional Status.  Willeen Cass [facility] 05/24/2015, 11:03 AM

## 2015-05-24 NOTE — Progress Notes (Signed)
Physical Therapy Discharge Summary  Patient Details  Name: [name] MRN: [id number] Date of Birth: 20-Jun-1956  Today's Date: 05/24/2015 PT Individual Time: 1000-1100 Treatment Session 2: 1415-1500 PT Individual Time Calculation (min): 60 min Treatment Session 2: 45 min   Patient has met 10 of 10 long term goals due to improved activity tolerance, improved balance, increased strength, increased range of motion, decreased pain, ability to compensate for deficits and improved coordination.  Patient to discharge at a primarily mod I w/c level, but very short ambulation with PFRW for household distances req Supervision level.  Modified Independent.   Patient's care partner is independent to provide the necessary physical assistance at discharge.  Recommendation:  Patient will benefit from HEP to continue to advance safe functional mobility, address ongoing impairments in R LE ROM and strength deficits, limited activity tolerance, and minimize fall risk.  Equipment: w/c and wide RW with L platform attachment  Reasons for discharge: treatment goals met and discharge from hospital  Patient/family agrees with progress made and goals achieved: Yes  Therapeutic Interventions Treatment Session 1: Pt received up in w/c with wife present for family training. W/C Management - PT teaches wife how each w/c part works and how to collapse and unfold w/c for transport - wife demonstrates back understanding. Pt's w/c management is mod I, including up/down low ramp and navigating in room. Gait Training - see function tab for details - pt pushes himself and is able to ambulate 50' hopping with PFRW while maintaining NWB through R foot and L wrist req SBA. Stairs are unsafe to do at this time. Wife educated on safety cues with sequencing and to always double check pt locks his brakes. Wife and pt agree to practice ambulation of 10'-15' with L PFRW at home for improved endurance. Therapeutic Activity - PT educates  wife on safety with stand-pivot transfer in/out of car, as well as car modifications to allow pt increased leg room and comfort during the drive. Pt plans to use backseat of car for transport. Wife demonstrates back understanding. Pt completes stand-pivot transfer w/c to/from easy chair with armrests (w/c cushion placed in seat of easy chair to simulate height of recliner at home) with SBA - no LOB req assist to correct. See function tab for bed mobility and other transfer details. Pt ended in bed with all needs in reach. Wife reports comfort in assisting pt as needed at home and has no further questions for PT.   Treatment Session 2: Pt received in bed with new forefoot off-loading shoe in place. Pt asks if he's still NWB and PT examines chart and notes the NWB R LE order is still active. Gait Training - Pt requests to try ambulation with PFRW and R [facility] boot. Pt ambulates 45' with CGA - in step-to pattern, but R LE stays in front of L LE throughout gait cycle. PT instructs pt this is ok for now in order to ensure R heel weightbearing. Therapeutic Exercise - PT instructs pt in B LE AROM and strengthening exercises: clam shells and side lie hip abduction 3 x 10 reps each. Pt ended up in w/c with all needs in reach. Pt safe to d/c home.    PT Discharge Precautions/Restrictions Precautions Precautions: Fall Precaution Comments: NWB R LE & L UE Required Braces or Orthoses: Other Brace/Splint Other Brace/Splint: wrist spling and cam boot on right LE Restrictions Weight Bearing Restrictions: Yes LUE Weight Bearing: Non weight bearing RLE Weight Bearing: Non weight bearing Other  Position/Activity Restrictions: R wrist Pain Pain Assessment Pain Assessment: 0-10 Pain Score: 8  Pain Type: Acute pain;Surgical pain Pain Location: Foot Pain Orientation: Right Pain Descriptors / Indicators: Aching Pain Onset: On-going Pain Intervention(s): Rest;Emotional support Multiple Pain Sites: No  Treatment  Session 2: Pt reports 7/10 pain in R foot and knee - PT uses rest breaks to decrease pt's pain.  Vision/Perception  Perception Comments: WFL  Cognition Overall Cognitive Status: Within Functional Limits for tasks assessed Arousal/Alertness: Awake/alert Orientation Level: Oriented X4 Attention: Focused;Sustained Focused Attention: Appears intact Sustained Attention: Appears intact Memory: Appears intact Memory Impairment: Decreased recall of new information Awareness: Appears intact Problem Solving: Impaired Problem Solving Impairment: Functional complex Safety/Judgment: Appears intact Sensation Sensation Light Touch: Impaired Detail Light Touch Impaired Details: Impaired RLE;Impaired LUE Stereognosis: Not tested Hot/Cold: Not tested Proprioception: Appears Intact Proprioception Impaired Details: Impaired RLE Additional Comments: R LE tingly knee to toes; L elbow to fingers tingly Coordination Gross Motor Movements are Fluid and Coordinated: No Fine Motor Movements are Fluid and Coordinated: Not tested Coordination and Movement Description: decreased gross movement of R LE due to swelling, weakness, and pin Motor  Motor Motor: Within Functional Limits  Mobility Bed Mobility Bed Mobility: Rolling Right;Rolling Left;Supine to Sit;Sit to Supine Rolling Right: 6: Modified independent (Device/Increase time) Rolling Left: 6: Modified independent (Device/Increase time) Left Sidelying to Sit: 6: Modified independent (Device/Increase time) Sit to Supine: 6: Modified independent (Device/Increase time) Transfers Transfers: Yes Sit to Stand: 5: Supervision Sit to Stand Details: Verbal cues for sequencing;Verbal cues for precautions/safety Stand to Sit: 5: Supervision;With upper extremity assist Stand to Sit Details (indicate cue type and reason): Verbal cues for precautions/safety Stand Pivot Transfers: 5: Supervision Stand Pivot Transfer Details: Verbal cues for  precautions/safety Squat Pivot Transfers: 6: Modified independent (Device/Increase time) Locomotion  Ambulation Ambulation: Yes Ambulation/Gait Assistance: 5: Supervision Ambulation Distance (Feet): 50 Feet Assistive device: Left platform walker Ambulation/Gait Assistance Details: Verbal cues for safe use of DME/AE Gait Gait: Yes Gait Pattern: Impaired Gait Pattern: Decreased step length - left;Trunk flexed;Poor foot clearance - left Gait velocity: slow Stairs / Additional Locomotion Stairs: No Wheelchair Mobility Wheelchair Mobility: Yes Wheelchair Assistance: 6: Modified independent (Device/Increase time) Environmental health practitioner: Both upper extremities Wheelchair Parts Management: Independent Distance: 300'  Trunk/Postural Assessment  Cervical Assessment Cervical Assessment: Within Functional Limits Thoracic Assessment Thoracic Assessment: Within Functional Limits Lumbar Assessment Lumbar Assessment: Within Functional Limits Postural Control Postural Control: Within Functional Limits  Balance Balance Balance Assessed: Yes Static Sitting Balance Static Sitting - Balance Support: Feet supported Static Sitting - Level of Assistance: 6: Modified independent (Device/Increase time) Dynamic Sitting Balance Dynamic Sitting - Balance Support: Left upper extremity supported;Right upper extremity supported;Feet supported Dynamic Sitting - Level of Assistance: 6: Modified independent (Device/Increase time) Static Standing Balance Static Standing - Balance Support: Bilateral upper extremity supported;During functional activity Static Standing - Level of Assistance: 6: Modified independent (Device/Increase time) Dynamic Standing Balance Dynamic Standing - Balance Support: Bilateral upper extremity supported;During functional activity Dynamic Standing - Level of Assistance: 5: Stand by assistance Extremity Assessment  RUE Assessment RUE Assessment: Within Functional Limits LUE  Assessment LUE Assessment: Exceptions to WFL LUE AROM (degrees) Overall AROM Left Upper Extremity: Deficits;Due to precautions;Due to pain LUE Overall AROM Comments: shoulder & elbow wfl; finger extension limited 25%; finger flexion limited 25%; decreased scapular mobilization LUE Strength LUE Overall Strength: Deficits;Due to precautions;Due to pain LUE Overall Strength Comments: shoulder not tested due to pain, elbow grossly 4/5; finger 2+/5 RLE Assessment RLE  Assessment: Exceptions to [facility] RLE AROM (degrees) Overall AROM Right Lower Extremity: Deficits;Due to decreased strength;Due to pain RLE Strength RLE Overall Strength: Deficits;Due to pain RLE Overall Strength Comments: hip flexion 3-/5, hip abduction 3-/5, knee extension 3+/5 knee flexion 3+/5, ankle DF 3-/5 LLE Assessment LLE Assessment: Within Functional Limits   See Function Navigator for Current Functional Status.  [facility] M 05/24/2015, 12:08 PM

## 2015-05-24 NOTE — Progress Notes (Signed)
ANTICOAGULATION CONSULT NOTE - Follow Up Consult  Pharmacy Consult for coumadin Indication: DVT  Allergies  Allergen Reactions  . Imitrex [Sumatriptan] Other (See Comments)    Chest pain, 2004 (tolerates 50 mg prn)  . Verapamil Other (See Comments)    Causes pvc's    Patient Measurements: Height: 6\' 3"  (190.5 cm) Weight: 226 lb 3.1 oz (102.6 kg) IBW/kg (Calculated) : 84.5 Heparin Dosing Weight:   Vital Signs: Temp: 97.7 F (36.5 C) (10/28 0603) Temp Source: Oral (10/28 0603) BP: 111/67 mmHg (10/28 0819) Pulse Rate: 77 (10/28 0819)  Labs:  Recent Labs  05/22/15 0506 05/24/15 0510  LABPROT 23.6* 24.7*  INR 2.13* 2.26*    Estimated Creatinine Clearance: 127.4 mL/min (by C-G formula based on Cr of 0.83).   Medications:  Scheduled:  . atorvastatin  80 mg Oral q1800  .  ceFAZolin (ANCEF) IV  2 g Intravenous Q8H  . docusate sodium  100 mg Oral BID  . feeding supplement (ENSURE ENLIVE)  237 mL Oral BID BM  . flecainide  100 mg Oral Q12H  . gabapentin  100 mg Oral TID  . metoprolol tartrate  75 mg Oral BID  . morphine  90 mg Oral Q12H  . multivitamin with minerals  1 tablet Oral Daily  . naloxegol oxalate  25 mg Oral QAC breakfast  . naproxen  500 mg Oral BID WC  . oxyCODONE  30 mg Oral TID PC & HS  . polyethylene glycol  17 g Oral Daily  . venlafaxine XR  75 mg Oral Q breakfast  . warfarin  5 mg Oral Once per day on Mon Thu  . warfarin  7.5 mg Oral Once per day on Sun Tue Wed Fri Sat  . Warfarin - Pharmacist Dosing Inpatient   Does not apply q1800   Infusions:    Assessment: 59 yo male with DVT is currently on therapeutic coumadin.  INR today is 2.26 Goal of Therapy:  INR 2-3 Monitor platelets by anticoagulation protocol: Yes   Plan:  Cont Coumadin 7.5 mg PO qTWFSS and 5mg  qMon & Thurs Protime to MWF  Ophia Shamoon, Tsz-Yin 05/24/2015,8:50 AM

## 2015-05-24 NOTE — Discharge Summary (Signed)
NAMELONDELL, NOLL NO.:  [id number]  MEDICAL RECORD NO.:  [id number]  LOCATION:  [id number]                        FACILITY:  [facility]  PHYSICIAN:  Mariam Dollar, P.A.  DATE OF BIRTH:  20-Aug-1955  DATE OF ADMISSION:  05/10/2015 DATE OF DISCHARGE:  05/25/2015                              DISCHARGE SUMMARY   DISCHARGE DIAGNOSES: 1. Functional deficits secondary to debilitation, sepsis, multi-     medical with methicillin-resistant Staphylococcus aureus abscesses     with multiple irrigation and debridements with rectus flap     procedure, April 19, 2015. 2. Coumadin for thromboembolism. 3. Chronic pain management. 4. Depression. 5. Status post right first and second ray amputations, May 17, 2015. 6. Tachycardia with supraventricular tachycardia. 7. Constipation. 8. Left wrist pain.  HISTORY OF PRESENT ILLNESS:  This is a 59 year old right-handed male with history of hypertension, chronic pain, MRSA abscesses, hypogonadism, taking testosterone supplementations, independent with occasional cane prior to admission, living with his wife.  Presented on April 07, 2015, with recent fall, increasing left hand and wrist pain.  In the ED, noted to be hypotensive, tachycardic, white blood cell count 31,000.  Placed on broad-spectrum antibiotics.  MRI of the left hand showed dorsal distal radius transverse fracture, diffuse edema of the hand, likely reactive to distal radius fracture, tear of the dorsal band of the scapholunate ligament.  Multiple small deep fluid collections in the hands, compatible with hematoma, questionable superimposed infection.  CT of right femur due to pain showed a 7.4 cm process involving adjacent to the proximal right SFA, question aneurysm, pseudoaneurysm.  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 Imogene Burn.  Followup Infectious  Disease.  Blood cultures positive bacillus - MSSA and remained on broad-spectrum antibiotics.  Returned to the OR multiple times for irrigation and debridements as well as rectus flap procedure, April 19, 2015, per Plastic Surgery, and a wound VAC applied.  TEE did not show valvular insufficiency or abscess.  Hospital course, tachycardia, SVT requiring adenosine followed by Cardiology Services.  Echocardiogram with ejection fraction of 60% and grade 1 diastolic dysfunction.  In regard to abscess of left dorsal hand, underwent incision and drainage on April 23, 2015, per [name].  Vascular study, left upper extremity showed no DVT.  Developed gangrenous thromboembolic injury to the right foot and toes, with consulted [name] and monitored.  Placed on Coumadin for thromboembolism.  Physical and occupational therapy ongoing.  The patient was admitted for comprehensive rehab program, April 29, 2015, slow progressive gains.  On the early morning of May 08, 2015, with nonspecific chest discomfort.  EKG revealed SVT, heart rate 168, troponin negative.  Blood pressure was a bit soft, 103/70.  Cardiology Service was consulted.  Discharged to acute care services.  Placed on flecainide 100 mg twice daily.  Repeat EKG, normal sinus rhythm.  The patient did receive followup with Orthopedic Service, [name], for thromboembolism disease of the right foot, dry gangrenous changes. Remained on chronic Coumadin therapy with dressing changes as advised. Awaiting possible need for amputation.  The patient remained  on Ancef as indicated for MSSA bacteremia per Infectious Disease.  The patient was readmitted for comprehensive rehab program.  PAST MEDICAL HISTORY:  See discharge diagnoses.  SOCIAL HISTORY:  Lives with spouse.  Used an occasional cane prior to admission.  Functional status upon admission to [facility], ambulating short distances, moderate assist 25 feet, mod-to-max  assist activities of daily living.  PHYSICAL EXAMINATION:  VITAL SIGNS:  Blood pressure 114/76, pulse 87, temperature 97, respirations 20. GENERAL:  This was an alert male, oriented x3. HEENT:  Normocephalic, EOMs normal. NECK:  Normal range of motion.  Neck supple. CARDIAC:  Regular rate and rhythm.  No murmurs, rubs, or gallops. PULMONARY:  Respiratory effort normal.  No respiratory distress. ABDOMEN:  Soft, nontender.  Good bowel sounds. EXTREMITIES:  Left wrist warm, red, tender to touch, slightly abrasion. Had some edema to the hand and forearm.  Right foot dressing in place with some dried blood on the dressing.  Dry gangrenous changes.  REHABILITATION HOSPITAL COURSE:  The patient was admitted to inpatient rehab services with therapies initiated on a 3-hour daily basis consisting of physical therapy, occupational therapy, and rehabilitation nursing.  The following issues were addressed during the patient's rehabilitation stay.  Pertaining to Mr. Labrador overall debilitation, sepsis, multi-medical MRSA abscesses, he had undergone multiple irrigation, debridements, rectus flap procedures per Plastic Surgery as well as a right first and second toe amputations for dry gangrene per [name] on May 17, 2015, placed in a shoe boot.  He remained on chronic Coumadin for thromboembolism.  INR goal 2.0 to 3.0, to be followed by Dr. Georgena Spurling at [phone], and a home health nurse arranged. Latest INR 2.26 05/24/2015. Chronic pain management; MS Contin, oxycodone, and good results.  Followed by Infectious Disease for multiple MSSA abscess infections, placed on Ancef that he would remain on until July 05, 2015.  Arrangements made for home health nursing to follow up outpatient infectious disease.  Left wrist pain followed by Orthopedic Service, [name].  There was a history of distal radius fracture.  Recent x-rays and imaging revealed collapse of joint space over the last 6 weeks,  did not appear to be acutely septic.  He would need followup with Orthopedic Services and repeat MRI as an outpatient.  In regard to hypertension, SVT, he was now on flecainide 100 mg every 12 hours as well as Lopressor 75 mg b.i.d., heart rate well controlled.  He would follow up with outpatient Cardiology Services.  Bouts of constipation resolved with laxative assistance.  The patient received weekly collaborative interdisciplinary team conferences to discuss estimated length of stay, family teaching, and any barriers to his discharge.  A Cam booth in place to right lower extremity after recent dry gangrene amputations of first and second toes.  He had a left hand splint for assistance due to some left wrist pain.  Ambulating 35 feet, close supervision.  Working with side stepping with his platform rolling walker.  Ongoing core strengthening.  Educated on transfer methods, safety awareness, and use of his assistive device.  Perform transfers as advised.  Complete bathing dressing and tub with only simple set up.  Full family teaching was completed and plan discharge home.  DISCHARGE MEDICATIONS: 1. Lipitor 80 mg p.o. daily. 2. Klonopin 2 mg p.o. twice daily as needed. 3. Colace 100 mg p.o. b.i.d. 4. Famvir 500 mg p.o. b.i.d. as needed fever blisters. 5. Tambocor 100 mg p.o. every 12 hours. 6. Neurontin 100 mg p.o. t.i.d. 7. Robaxin  500 mg p.o. every 6 hours as needed for muscle spasms. 8. Lopressor 75 mg p.o. b.i.d. 9. MS Contin 90 mg p.o. every 12 hours. 10.Multivitamin daily. 11.Movantik 25 mg p.o. daily. 12.Naprosyn 500 mg p.o. b.i.d. 13.Oxycodone immediate release 30 mg 3 times daily after meals and     bedtime. 14.Oxycodone 2 tablets every 6 hours as needed for moderate pain,     dispense of 60 tablets. 15.MiraLax daily, hold for loose stools. 16.Viroptic ophthalmic solution 1 drop both eyes twice daily as     needed. 17.Effexor 75 mg p.o. daily. 18.Coumadin, latest dose  of 5 mg Mondays and Thursdays with 7.5 mg all other days(latest INR 2.26 05/22/2015)  DIET:  Regular consistency.  FOLLOWUP:  The patient would follow up with Dr. Claudette LawsAndrew Kirsteins at the outpatient service as needed; Dr. Aldean BakerMarcus Duda, 2 weeks, call for appointment; Dr. Leonides SakeBrian Chen, call for appointment; Dr. Jeanella CaraJ Ganji, call for appointment, 2 weeks; Dr. Izora Ribasoley, Orthopedic Services, 2 weeks; Dr. Paulette Blanchornelius Van Dam at the outpatient Infectious Disease, 4 weeks; Dr. Leta Baptisthimmappa, 1 to 2 weeks, after discharge; Dr. Patria ManeMike Gassemi, medical management, June 03, 2015.  SPECIAL INSTRUCTIONS:  Continue intravenous Ancef 2 g every 8 hours until July 05, 2015, and stop.  Home health nurse to check INR on May 28, 2015, results to Dr. Georgena SpurlingGassemi at [phone], fax [phone].  Collagen dressing right thigh 3 times weekly with foam for dry dressing, okay to remove dressing to bathe, ongoing therapies had been arranged.     Mariam Dollaraniel Angiulli, P.A.     DA/MEDQ  D:  05/24/2015  T:  05/24/2015  Job:  [id number]  cc:   Pamella PertJagadeesh R. Ganji, MD Erick ColaceAndrew E. Kirsteins, M.D. Johnette AbrahamHarrill C Coley, MD Nadara MustardMarcus V. Duda, MD Glenna FellowsBrinda Thimmappa, MD Acey Lavornelius Van Dam, MD Fransisco HertzBrian L Chen, MD Gaspar SkeetersMike M Gassemi, MD

## 2015-05-25 MED ORDER — HEPARIN SOD (PORK) LOCK FLUSH 100 UNIT/ML IV SOLN
250.0000 [IU] | INTRAVENOUS | Status: AC | PRN
  Administered 2015-05-25: 250 [IU]

## 2015-05-25 NOTE — Progress Notes (Signed)
Subjective/Complaints:  No severe foot pain    Review of systems: Right foot pain. Denies CP, SOB, n/v/d.   Objective: Vital Signs: Blood pressure 110/60, pulse 69, temperature 98 F (36.7 C), temperature source Oral, resp. rate 17, height $RemoveBe'6\' 3"'PhDAyiFml$  (1.905 m), weight 102.6 kg (226 lb 3.1 oz), SpO2 97 %. No results found. Results for orders placed or performed during the hospital encounter of 05/10/15 (from the past 72 hour(s))  Protime-INR     Status: Abnormal   Collection Time: 05/24/15  5:10 AM  Result Value Ref Range   Prothrombin Time 24.7 (H) 11.6 - 15.2 seconds   INR 2.26 (H) 0.00 - 1.49     Gen: NAD. Vital signs reviewed.  HEENT: Normocephalic, atraumatic. Cardio: RRR and no murmur Resp: CTA B/L and unlabored GI: BS positive and NT, ND . Neuro: Alert/Oriented, Anxious, Abnormal Sensation in left fingers and RLE Musc/Skel:  Edema of LUE Tenderness along LUE Antigravity strength throughout Skin:   Dressing to Right foot c/d/i. Left wrist has mild erythema dorsal ulnar aspect, also has edema of the fingers.  Assessment/Plan: 1. 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  Plan discharge on 05/25/2015 FIM: Function - Bathing Position: Shower Body parts bathed by patient: Right arm, Left arm, Chest, Abdomen, Front perineal area, Buttocks, Right upper leg, Left upper leg, Left lower leg, Back (right LE wrapped to not get wet) Body parts bathed by helper: Back, Right lower leg Bathing not applicable: Right lower leg Assist Level: More than reasonable time  Function- Upper Body Dressing/Undressing What is the patient wearing?: Pull over shirt/dress Pull over shirt/dress - Perfomed by patient: Thread/unthread right sleeve, Thread/unthread left sleeve, Put head through opening, Pull shirt over trunk Pull over shirt/dress  - Perfomed by helper: Put head through opening Assist Level: No help, No cues Set up : To obtain clothing/put away Function - Lower Body Dressing/Undressing What is the patient wearing?: Pants, Shoes (and CAM boot) Position:  (sitting on edge of shower bench) Pants- Performed by patient: Thread/unthread right pants leg, Thread/unthread left pants leg, Pull pants up/down Pants- Performed by helper: Thread/unthread right pants leg Non-skid slipper socks- Performed by patient: Don/doff left sock Non-skid slipper socks- Performed by helper: Don/doff right sock, Don/doff left sock Shoes - Performed by patient: Don/doff left shoe (n/a right shoe) Assist for footwear: Partial/moderate assist (cam boot ) Assist for lower body dressing: Set up  Function - Toileting Toileting activity did not occur: N/A Toileting steps completed by patient: Adjust clothing prior to toileting, Performs perineal hygiene, Adjust clothing after toileting Toileting steps completed by helper: Adjust clothing prior to toileting, Performs perineal hygiene, Adjust clothing after toileting Toileting Assistive Devices: Grab bar or rail Assist level: More than reasonable time  Function - Air cabin crew transfer activity did not occur: N/A Toilet transfer assistive device: Elevated toilet seat/BSC over toilet Assist level to toilet: No Help, no cues, assistive device, takes more than a reasonable amount of time Assist level from toilet: No Help, no cues, assistive device, takes more than a reasonable amount of time Assist level to bedside commode (at bedside): Touching or steadying assistance (Pt > 75%) Assist level from bedside commode (at bedside): Touching or steadying assistance (Pt > 75%)  Function - Chair/bed transfer Chair/bed transfer method: Squat pivot, Stand pivot Chair/bed transfer assist level: No Help, no cues, assistive device, takes more than a reasonable amount of  time Chair/bed transfer assistive  device: Armrests, Walker, Other (L platform attachment) Chair/bed transfer details: Verbal cues for safe use of DME/AE, Verbal cues for technique, Verbal cues for precautions/safety  Function - Locomotion: Wheelchair Will patient use wheelchair at discharge?: Yes Type: Manual Max wheelchair distance: 200' Assist Level: No help, No cues, assistive device, takes more than reasonable amount of time Assist Level: No help, No cues, assistive device, takes more than reasonable amount of time Wheel 150 feet activity did not occur: Safety/medical concerns Assist Level: No help, No cues, assistive device, takes more than reasonable amount of time Turns around,maneuvers to table,bed, and toilet,negotiates 3% grade,maneuvers on rugs and over doorsills: Yes Function - Locomotion: Ambulation Assistive device: Walker-platform Max distance: 70' Assist level: Supervision or verbal cues Assist level: Supervision or verbal cues Walk 50 feet with 2 turns activity did not occur: Safety/medical concerns Assist level: Supervision or verbal cues Walk 150 feet activity did not occur: Safety/medical concerns Walk 10 feet on uneven surfaces activity did not occur: Safety/medical concerns  Function - Comprehension Comprehension: Auditory Comprehension assist level: Understands complex 90% of the time/cues 10% of the time  Function - Expression Expression: Verbal Expression assist level: Expresses complex 90% of the time/cues < 10% of the time  Function - Social Interaction Social Interaction assist level: Interacts appropriately with others with medication or extra time (anti-anxiety, antidepressant).  Function - Problem Solving Problem solving assist level: Solves complex 90% of the time/cues < 10% of the time  Function - Memory Memory assist level: Recognizes or recalls 90% of the time/requires cueing < 10% of the time Patient normally able to recall (first 3 days only): Current season, Location of own  room, Staff names and faces, That he or she is in a hospital  Medical Problem List and Plan: 1. 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  DVT Prophylaxis/Anticoagulation: Coumadin for thromboembolism. Monitor for any bleeding episodes  -INR 2.26 on 10/28, no signs of bleeding at the current time 3. Pain Management: MS Contin 90 mg twice a day, oxycodone for breakthrough pain.  Developing increased nerve pain in    -gabapentin--  -outpt follow up of meds   4. Mood: Effexor XR 75 mg daily, Klonopin 0.5 mg 4 times a day as needed 5. Neuropsych: This patient is capable of making decisions on his own behalf. 6. 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 F/u ortho                                                        7. Fluids/Electrolytes/Nutrition: Strict I and O's with follow-up chemistries  - hydration appears adequate 8.ID. Presently on Ancef 2 g 3 times a day. ID rec total 12 wks,ppatient will discharge home on the current dose and follow-up with infectious disease in 4 weeks. 9. Tachycardia SVT. Resolved.  Follow-up cardiology services. Continue Lopressor 50 mg twice a day for now, HR 69 this am 10. Constipation. Laxitive  assistance, per pt doing well, large BM this morning, continent 11.  Severe protein malnutrition, alb 1.6- supplement with beneprotein, enc hi protein diet choices 12.  Urinary retention-voiding well 13. Left wrist pain. History of distal radial fracture. X-rays indicate collapse of joint space over the last 6 weeks or so. Per surgery, does not appear to be an acutely septic joint. Appreciatehand surgery  review of MRI. Will need at least 6 weeks of antibiotics, question broaden coverage. Infectious disease rec same abx for addnl 8 weeks per Dr Lenon Curt.  MRI Repeat in about 2 weeks, Dr. Lenon Curt will follow-up as an outpatient. 14. Acute blood loss anemia likely due to most recent surgery LOS (Days) 15 A FACE TO FACE EVALUATION WAS PERFORMED  [name] T 05/25/2015, 9:12 AM

## 2015-05-25 NOTE — Progress Notes (Signed)
Pt. Got d/c paper,follow up instructions and prescription.Pt. Ready to go home with his wife.

## 2015-05-28 NOTE — Progress Notes (Signed)
Social Work Discharge Note  The overall goal for the admission was met for:   Discharge location: Yes - home  Length of Stay: Yes - 25 days  Discharge activity level: Yes - supervision   Home/community participation: Yes  Services provided included: MD, RD, PT, OT, RN, Pharmacy, Neuropsych and SW  Financial Services: Medicaid  Follow-up services arranged: Home Health: RN for IV antibiotics from [facility], DME: 20x20 w/c with basic cushion and elevating leg rests: drop arm commode; tub transfer bench; hospital bed: wide rolling walker with left platform; IV antibiotics until 07-05-15 from [place] and Patient/Family has no preference for HH/DME agencies  Comments (or additional information):  Pt to d/c to home with his wife and two adult sons.  Pt's family was having ramp installed. Wife was trained on how to administer the IV antibiotics and the therapists trained wife on other tasks.  Pt is pleased to be going home.  Will only have Dana for IV, no therapies due to Medicaid criteria for therapy not being met.  Patient/Family verbalized understanding of follow-up arrangements: Yes  Individual responsible for coordination of the follow-up plan: pt with his wife  Confirmed correct DME delivered: Trey Sailors 05/28/2015    Demitrius Crass, Silvestre Mesi

## 2015-06-05 ENCOUNTER — Encounter: Payer: Self-pay | Admitting: Infectious Disease

## 2015-06-05 ENCOUNTER — Ambulatory Visit (INDEPENDENT_AMBULATORY_CARE_PROVIDER_SITE_OTHER): Payer: Medicaid Other | Admitting: Infectious Disease

## 2015-06-05 VITALS — BP 128/83 | HR 77 | Temp 98.0°F

## 2015-06-05 DIAGNOSIS — L02419 Cutaneous abscess of limb, unspecified: Secondary | ICD-10-CM | POA: Diagnosis not present

## 2015-06-05 DIAGNOSIS — I724 Aneurysm of artery of lower extremity: Secondary | ICD-10-CM | POA: Diagnosis not present

## 2015-06-05 DIAGNOSIS — M869 Osteomyelitis, unspecified: Secondary | ICD-10-CM | POA: Insufficient documentation

## 2015-06-05 DIAGNOSIS — R6521 Severe sepsis with septic shock: Secondary | ICD-10-CM

## 2015-06-05 DIAGNOSIS — I330 Acute and subacute infective endocarditis: Secondary | ICD-10-CM

## 2015-06-05 DIAGNOSIS — R6520 Severe sepsis without septic shock: Secondary | ICD-10-CM | POA: Diagnosis not present

## 2015-06-05 DIAGNOSIS — B958 Unspecified staphylococcus as the cause of diseases classified elsewhere: Secondary | ICD-10-CM

## 2015-06-05 DIAGNOSIS — A4101 Sepsis due to Methicillin susceptible Staphylococcus aureus: Secondary | ICD-10-CM

## 2015-06-05 DIAGNOSIS — I2690 Septic pulmonary embolism without acute cor pulmonale: Secondary | ICD-10-CM | POA: Diagnosis not present

## 2015-06-05 DIAGNOSIS — M86132 Other acute osteomyelitis, left radius and ulna: Secondary | ICD-10-CM

## 2015-06-05 DIAGNOSIS — I96 Gangrene, not elsewhere classified: Secondary | ICD-10-CM | POA: Diagnosis not present

## 2015-06-05 DIAGNOSIS — L02512 Cutaneous abscess of left hand: Secondary | ICD-10-CM

## 2015-06-05 DIAGNOSIS — I729 Aneurysm of unspecified site: Secondary | ICD-10-CM

## 2015-06-05 DIAGNOSIS — S91001A Unspecified open wound, right ankle, initial encounter: Secondary | ICD-10-CM | POA: Diagnosis not present

## 2015-06-05 DIAGNOSIS — M86271 Subacute osteomyelitis, right ankle and foot: Secondary | ICD-10-CM | POA: Diagnosis not present

## 2015-06-05 DIAGNOSIS — R7881 Bacteremia: Secondary | ICD-10-CM

## 2015-06-05 DIAGNOSIS — A419 Sepsis, unspecified organism: Secondary | ICD-10-CM

## 2015-06-05 DIAGNOSIS — I76 Septic arterial embolism: Secondary | ICD-10-CM

## 2015-06-05 HISTORY — DX: Osteomyelitis, unspecified: M86.9

## 2015-06-05 NOTE — Patient Instructions (Signed)
I am very worried about your left wrist and new ulcer on right anle  We need urgent MRI left wrist and also need MRI ankle and foot  IF Dr. Izora Ribasoley will not see you I can arrange for a different hand surgeon to see you

## 2015-06-05 NOTE — Progress Notes (Signed)
Chief complaint: "left wrist pain and warmth, new ulcer on heel:" Subjective:    Patient ID: [name], male    DOB: [birth year], 57 y.o.   MRN: [id number]  HPI  [name] is an 59 y.o. 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 and later by plastics later to cover wound.    Patient also developed a left hand abscess and MRI done on 04/07/15 showed:  1. Dorsal distal radius transverse fracture, partially visible. 2. Diffuse edema of the hand, likely reactive to distal radius fracture. 3. Tear of the dorsal band of the scapholunate ligament. 4. Multiple small deep fluid collections in the hands are most compatible with hematoma given the heterogeneous T2 signal. In this patient with an elevated white blood cell count, superimposed infection is possible but this would be rare and in the setting of wrist trauma, ordinary hematoma is most likely.   He had I and D of hand abscess by Dr. Lenon Curt on 04/23/2015. Patient has remained on IV cefazolin since 04/10/15 and active abx since 04/06/15.  Despite this he had  been having increasing pain in his wrist, particular with increase in physical activity. Rehab MDs were concenred and ordered MRI on 05/11/15 which showed :  edema in the distal radius, ulna,all of the carpal bones and bases of the metacarpals.  Extensive soft tissue swelling and marked synovial thickening are seen throughout the carpus. 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  Dr. Lenon Curt was reconsulted and saw the pt yesterday and he felt that the MRI was of poor quality but there is no evidence of necrosis. He did not feel the patient needed surgery at this point in time rather  needed wrist immobilization with a splint.   Of note the patient also has thombotic lesions in his foot from his infected mycotic aneurysm some of which are necrotic and will likely require amputation ultimately. The right great toe and majora  had some purulent drainage from the site.   I had discussions with Dr Lenon Curt who as mentioned did not feel there was compelling evidence for osteomyelitis in wrist in need of surgery and we planned further protracted IV abx followed by repeat MRI --which has not yet been done. He remains on IV ancef 2g IV q 8 hours.  His left wrist pain persists and is WORSE with worsening redness and swelling. His ESR on blood work done by [facility] has risen further to 120.  Of not he also did have amputation of 2 toes by Dr Sharol Given during hospital stay and has been followed by Dr. Sharol Given. He has developed new ulcer on the heel on the right side.  He also has persistent lesion on third toe (see pictures)  He has nto been seen by Dr. Lenon Curt because he was told by front office staff that he needed a referral?--despite fact that he had been operated upon!  Past Medical History  Diagnosis Date  . Hypertension   . Chronic knee pain   . Hyperlipemia   . PUD (peptic ulcer disease)   . Dysrhythmia   . Peripheral vascular disease ([place])   . Anxiety   . GERD (gastroesophageal reflux disease)   . 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 Foot  osteomyelitis, right ([place]) 06/05/2015  . Wound of right ankle 06/05/2015    Past Surgical History  Procedure Laterality Date  . I&d extremity Left 07/12/2013    Procedure: I&D Left Thigh Abscess;  Surgeon: Wylene Simmer, MD;  Location: [place];  Service: Orthopedics;  Laterality: Left;  . Replacement total knee bilateral    . Tonsillectomy    . Hernia repair    . Cholecystectomy    . Appendectomy    . Shoulder surgery    . Elbow surgery    . Femoral artery exploration Right 04/11/2015    Procedure: Resection of infected right femoral  artery aneurysm;  Surgeon: Conrad Augusta, MD;  Location: [place];  Service: Vascular;  Laterality: Right;  . Incision and drainage abscess Right 04/11/2015    Procedure: INCISION AND DRAINAGE OF RIGHT THIGH ABSCESS;  Surgeon: Conrad [place], MD;  Location: [place];  Service: Vascular;  Laterality: Right;  . Incision and drainage abscess Right 04/13/2015    Procedure: INCISION AND DRAINAGE THIGH ABSCESS;  Surgeon: Conrad [place], MD;  Location: [place];  Service: Vascular;  Laterality: Right;  . I&d extremity Left 04/17/2015    Procedure: IRRIGATION AND DEBRIDEMENT LEFT EXTREMITY;  Surgeon: Conrad [place], MD;  Location: [place];  Service: Vascular;  Laterality: Left;  Marland Kitchen Muscle flap closure Right 04/19/2015    Procedure: RIGHT RECTUS ABDOMINUS  FLAP TO RIGHT GROIN;  Surgeon: Irene Limbo, MD;  Location: [place];  Service: Plastics;  Laterality: Right;  . Femoral revision Right 04/19/2015    Procedure: OVERSEW RIGHT FEMORAL VEIN ;  Surgeon: Angelia Mould, MD;  Location: [place];  Service: Vascular;  Laterality: Right;  . Wound exploration Right 04/23/2015    Procedure: EXAM UNDER ANESTHESIA AND VAC CHANGE RIGHT THIGH;  Surgeon: Irene Limbo, MD;  Location: [place];  Service: Plastics;  Laterality: Right;  . Amputation Right 05/17/2015    Procedure: Right 1st and 2nd Toe Amputation;  Surgeon: Newt Minion, MD;  Location: [place];  Service: Orthopedics;  Laterality: Right;    Family History  Problem Relation Age of Onset  . Heart attack Paternal Grandmother 66  . Heart disease Mother 22    arrythmia  . Stroke Paternal Grandfather   . Stroke Maternal Grandfather       Social History   Social History  . Marital Status: Married    Spouse Name: N/A  . Number of Children: 3  . Years of Education: N/A   Occupational History  .      Sales   Social History Main Topics  . Smoking status: Former Smoker    Types: Cigarettes  . Smokeless tobacco: Never Used  . Alcohol Use: No  . Drug Use: No  .  Sexual Activity: Not Asked   Other Topics Concern  . None   Social History Narrative    Allergies  Allergen Reactions  . Imitrex [Sumatriptan] Other (See Comments)    Chest pain, 2004 (tolerates 50 mg prn)  . Verapamil Other (See Comments)    Causes pvc's     Current outpatient prescriptions:  .  albuterol (PROAIR HFA) 108 (90 BASE) MCG/ACT inhaler, Inhale 1 puff into the lungs every 6 (six) hours as needed for wheezing or shortness of breath. , Disp: , Rfl:  .  atorvastatin (LIPITOR) 80 MG tablet, Take 1 tablet (80 mg total) by mouth daily., Disp: 30 tablet, Rfl: 1 .  ceFAZolin (ANCEF) 2-3 GM-% SOLR, IV Ancef 2  g every 8 hours until 07/05/2015 and stop, Disp: , Rfl:  .  docusate sodium (COLACE) 100 MG capsule, Take 1 capsule (100 mg total) by mouth 2 (two) times daily., Disp: 10 capsule, Rfl: 0 .  famciclovir (FAMVIR) 500 MG tablet, Take 500 mg by mouth 2 (two) times daily as needed (fever blisters). , Disp: , Rfl:  .  flecainide (TAMBOCOR) 100 MG tablet, Take 1 tablet (100 mg total) by mouth every 12 (twelve) hours., Disp: 60 tablet, Rfl: 1 .  gabapentin (NEURONTIN) 100 MG capsule, Take 1 capsule (100 mg total) by mouth 3 (three) times daily., Disp: 90 capsule, Rfl: 1 .  methocarbamol (ROBAXIN) 500 MG tablet, Take 1 tablet (500 mg total) by mouth every 6 (six) hours as needed for muscle spasms., Disp: 60 tablet, Rfl: 0 .  Metoprolol Tartrate 75 MG TABS, Take 75 mg by mouth 2 (two) times daily., Disp: 60 tablet, Rfl: 1 .  morphine (MS CONTIN) 30 MG 12 hr tablet, Take 3 tablets (90 mg total) by mouth every 12 (twelve) hours., Disp: 60 tablet, Rfl: 0 .  Multiple Vitamin (MULTIVITAMIN WITH MINERALS) TABS tablet, Take 1 tablet by mouth daily., Disp: , Rfl:  .  naloxegol oxalate (MOVANTIK) 25 MG TABS tablet, Take 1 tablet (25 mg total) by mouth daily before breakfast., Disp: 30 tablet, Rfl: 0 .  naproxen (NAPROSYN) 500 MG tablet, Take 1 tablet (500 mg total) by mouth 2 (two) times daily  with a meal., Disp: 60 tablet, Rfl: 1 .  oxycodone (ROXICODONE) 30 MG immediate release tablet, Take 1 tablet (30 mg total) by mouth every 6 (six) hours. scheduled, Disp: 90 tablet, Rfl: 0 .  polyethylene glycol (MIRALAX / GLYCOLAX) packet, Take 17 g by mouth daily., Disp: 14 each, Rfl: 0 .  SUMAtriptan (IMITREX) 50 MG tablet, Take 50 mg by mouth daily as needed for migraine or headache. Maximum 2 doses in 2 days, Disp: , Rfl: 0 .  trifluridine (VIROPTIC) 1 % ophthalmic solution, Place 1 drop into both eyes 2 (two) times daily as needed (breakouts (blurry, matted eyelids))., Disp: , Rfl:  .  venlafaxine XR (EFFEXOR-XR) 75 MG 24 hr capsule, Take 1 capsule (75 mg total) by mouth daily with breakfast., Disp: 30 capsule, Rfl: 1 .  warfarin (COUMADIN) 5 MG tablet, 1 tablet Mondays and Thursdays. 1-1/2 tablet Sunday Tuesday Wednesday Friday Saturday, Disp: 90 tablet, Rfl: 1 .  [DISCONTINUED] diphenhydrAMINE (BENADRYL) 25 MG tablet, Take 1 tablet (25 mg total) by mouth every 6 (six) hours. (Patient not taking: Reported on 02/25/2015), Disp: 20 tablet, Rfl: 0 .  [DISCONTINUED] pantoprazole (PROTONIX) 40 MG tablet, Take 1 tablet (40 mg total) by mouth daily. (Patient taking differently: Take 40 mg by mouth daily as needed (heartburn). ), Disp: 30 tablet, Rfl: 0   Review of Systems  Constitutional: Negative for fever, chills, diaphoresis, activity change, appetite change, fatigue and unexpected weight change.  HENT: Negative for congestion, rhinorrhea, sinus pressure, sneezing, sore throat and trouble swallowing.   Eyes: Negative for photophobia and visual disturbance.  Respiratory: Negative for cough, chest tightness, shortness of breath, wheezing and stridor.   Cardiovascular: Negative for chest pain, palpitations and leg swelling.  Gastrointestinal: Negative for nausea, vomiting, abdominal pain, diarrhea, constipation, blood in stool, abdominal distention and anal bleeding.  Genitourinary: Negative for  dysuria, hematuria, flank pain and difficulty urinating.  Musculoskeletal: Positive for myalgias, joint swelling and arthralgias. Negative for back pain and gait problem.  Skin: Positive for wound. Negative for color  change, pallor and rash.  Neurological: Negative for dizziness, tremors, weakness and light-headedness.  Hematological: Negative for adenopathy. Does not bruise/bleed easily.  Psychiatric/Behavioral: Negative for behavioral problems, confusion, sleep disturbance, dysphoric mood, decreased concentration and agitation.       Objective:   Physical Exam  Constitutional: He is oriented to person, place, and time. He appears well-developed and well-nourished.  HENT:  Head: Normocephalic and atraumatic.  Eyes: Conjunctivae and EOM are normal.  Neck: Normal range of motion. Neck supple.  Cardiovascular: Normal rate, regular rhythm and normal heart sounds.  Exam reveals no gallop and no friction rub.   No murmur heard. Pulmonary/Chest: He is in respiratory distress. He has no wheezes.  Abdominal: Soft. He exhibits no distension.  Neurological: He is alert and oriented to person, place, and time.  Skin: Skin is warm and dry.  Psychiatric: He has a normal mood and affect. His behavior is normal. Judgment and thought content normal.   Left wrist October 14th, 2016       Left wrist November 9th, 2016:      Right toes October 2016 preop        Right foot November 9th postop:           Right ankle wound 06/05/15:     Left prosthetic knee site 06/05/15: clean     VVS operative site 06/05/15:         Assessment & Plan:   MSSA bacteremia infected mycotic aneurysm and thigh abscess sp  surgery by vascular surgery then plastic surgeries also with fractured wrist and abscess in hand status post I&D of hand abscess, thrombotic lesions to toes where he has chronic wounds and areas that  with MRI showing evidence of septic arthritis and osteomyelitis in  his left wrist in October but no surgery performed (see above) with amputations of 2 toes by Dr. Sharol Given protracted IV abx but despite this worsening and persistence of wrist pain, edema, and warmth (on exam is fairly obvious that is is warmer than tissue proximal) Also with new lesion on ankle and persistent area from thrombotic event on toe  #1 Left wrist with increasing pain and MRI showing evidence of septic arthritis and osteomyelitis in October  I am ordering MRI to be repeated of this wrist   His ESR is high, his wrist remains swollen and warm and he has not had surgery for what was called possible septic arthritis and osteo on MRI (albeit poor quality MRI)  CONSEQUENCES of failing to operate on this septic joint are not limited to morbidity to the joint itself but pos risk of recurrent bacteremia with potential to seed his left THA, his heart valves and his repaired vascular sites  If Dr. Brennan Bailey office is unable to have him seen there (and it seems like a misunderstanding) then I will refer him to a different hand surgeon  #2 Persistent abnormality on 3rd digit and new ulcer ankle after 2 toe amptution by Dr. Sharol Given --will also  Get MRI of the ankle and foot and he will followup with Dr. Sharol Given diligently   #3 MSSA bacteremia with mycotic aneurysm: he never had TEE before and had all 4 arrhythmias I would at minimum consider getting TTE repeat  I spent greater than 40 minutes with the patient including greater than 50% of time in face to face counsel of the patient re his likely septic wrist with osteomyelitis, his infected aneurysm, MSSA bacteremia, osteo of his toes, persistent lesion there from thrombosis  and new ulcer and in coordination of his care.

## 2015-06-07 DIAGNOSIS — S52502D Unspecified fracture of the lower end of left radius, subsequent encounter for closed fracture with routine healing: Secondary | ICD-10-CM | POA: Diagnosis not present

## 2015-06-07 DIAGNOSIS — L02415 Cutaneous abscess of right lower limb: Secondary | ICD-10-CM | POA: Diagnosis not present

## 2015-06-07 DIAGNOSIS — A4901 Methicillin susceptible Staphylococcus aureus infection, unspecified site: Secondary | ICD-10-CM | POA: Diagnosis not present

## 2015-06-07 DIAGNOSIS — Z452 Encounter for adjustment and management of vascular access device: Secondary | ICD-10-CM | POA: Diagnosis not present

## 2015-06-10 ENCOUNTER — Encounter (HOSPITAL_BASED_OUTPATIENT_CLINIC_OR_DEPARTMENT_OTHER): Payer: Self-pay | Admitting: Emergency Medicine

## 2015-06-10 ENCOUNTER — Emergency Department (HOSPITAL_BASED_OUTPATIENT_CLINIC_OR_DEPARTMENT_OTHER)
Admission: EM | Admit: 2015-06-10 | Discharge: 2015-06-10 | Discharge status: Against Medical Advice | Payer: Medicaid Other | Attending: Emergency Medicine | Admitting: Emergency Medicine

## 2015-06-10 DIAGNOSIS — M25579 Pain in unspecified ankle and joints of unspecified foot: Secondary | ICD-10-CM | POA: Insufficient documentation

## 2015-06-10 DIAGNOSIS — I10 Essential (primary) hypertension: Secondary | ICD-10-CM | POA: Diagnosis not present

## 2015-06-10 DIAGNOSIS — G8929 Other chronic pain: Secondary | ICD-10-CM | POA: Insufficient documentation

## 2015-06-10 NOTE — ED Notes (Signed)
Called to front desk to talk to patient. As I am coming to the front he is wheeling himself out of the door and states that he does not want to wait any longer. The patient is unwilling to sign out.

## 2015-06-10 NOTE — ED Notes (Signed)
Patient states that he is a pain patient and he took his last pain pill this am. The patient has MD appointment in am  With pain dr but can not make it until then  - his foot hurts

## 2015-06-11 ENCOUNTER — Telehealth: Payer: Self-pay | Admitting: *Deleted

## 2015-06-11 NOTE — Telephone Encounter (Signed)
Patient calling asking if MRIs have been scheduled.  Val Steps is working on English as a second language teacherinsurance authorization.   Will need to be scheduled once this is approved. Patient wanted to know if his PCP should go ahead with the referral to Dr. Amanda PeaGramig.  Patient has medicaid, referrals must come from PCP.  RN advised patient's wife to go ahead and ask Dr. Georgena SpurlingGassemi for the referral.  We will be happy to provide any additional information as needed.  RN also advised patient to ask for all images on disk to bring to orthopedic appointment. Andree CossHowell, Cinque Begley M, RN

## 2015-06-12 NOTE — Telephone Encounter (Signed)
Craig MonsValeria was given the MRIs  for prior authorization. I checked with her yesterday and she said she had not received approval yet. Craig MolaJacqueline [name]

## 2015-06-24 ENCOUNTER — Encounter: Payer: Self-pay | Admitting: Infectious Disease

## 2015-06-25 ENCOUNTER — Telehealth: Payer: Self-pay | Admitting: *Deleted

## 2015-06-25 NOTE — Telephone Encounter (Signed)
Patient notified of appt for MRIs for 07/12/15 at 11:45 AM [facility]. These tests have been prior authorized. Wendall MolaJacqueline Cockerham

## 2015-07-04 ENCOUNTER — Ambulatory Visit (INDEPENDENT_AMBULATORY_CARE_PROVIDER_SITE_OTHER): Payer: Medicaid Other | Admitting: Infectious Disease

## 2015-07-04 ENCOUNTER — Ambulatory Visit: Payer: Medicaid Other | Admitting: Infectious Disease

## 2015-07-04 ENCOUNTER — Inpatient Hospital Stay (HOSPITAL_COMMUNITY)
Admission: AD | Admit: 2015-07-04 | Discharge: 2015-07-11 | DRG: 501 | Disposition: A | Discharge status: Home Health | Payer: Medicaid Other | Source: Ambulatory Visit | Attending: Internal Medicine | Admitting: Internal Medicine

## 2015-07-04 DIAGNOSIS — Z89421 Acquired absence of other right toe(s): Secondary | ICD-10-CM | POA: Diagnosis not present

## 2015-07-04 DIAGNOSIS — F1120 Opioid dependence, uncomplicated: Secondary | ICD-10-CM | POA: Diagnosis present

## 2015-07-04 DIAGNOSIS — R7881 Bacteremia: Secondary | ICD-10-CM

## 2015-07-04 DIAGNOSIS — E785 Hyperlipidemia, unspecified: Secondary | ICD-10-CM | POA: Diagnosis present

## 2015-07-04 DIAGNOSIS — M86131 Other acute osteomyelitis, right radius and ulna: Secondary | ICD-10-CM | POA: Insufficient documentation

## 2015-07-04 DIAGNOSIS — A4101 Sepsis due to Methicillin susceptible Staphylococcus aureus: Secondary | ICD-10-CM

## 2015-07-04 DIAGNOSIS — M869 Osteomyelitis, unspecified: Secondary | ICD-10-CM | POA: Diagnosis present

## 2015-07-04 DIAGNOSIS — I10 Essential (primary) hypertension: Secondary | ICD-10-CM | POA: Diagnosis present

## 2015-07-04 DIAGNOSIS — I480 Paroxysmal atrial fibrillation: Secondary | ICD-10-CM | POA: Diagnosis present

## 2015-07-04 DIAGNOSIS — Z7901 Long term (current) use of anticoagulants: Secondary | ICD-10-CM | POA: Diagnosis not present

## 2015-07-04 DIAGNOSIS — L02419 Cutaneous abscess of limb, unspecified: Secondary | ICD-10-CM

## 2015-07-04 DIAGNOSIS — A419 Sepsis, unspecified organism: Secondary | ICD-10-CM

## 2015-07-04 DIAGNOSIS — M79603 Pain in arm, unspecified: Secondary | ICD-10-CM | POA: Diagnosis present

## 2015-07-04 DIAGNOSIS — Z79891 Long term (current) use of opiate analgesic: Secondary | ICD-10-CM

## 2015-07-04 DIAGNOSIS — L03114 Cellulitis of left upper limb: Secondary | ICD-10-CM | POA: Diagnosis present

## 2015-07-04 DIAGNOSIS — B958 Unspecified staphylococcus as the cause of diseases classified elsewhere: Secondary | ICD-10-CM

## 2015-07-04 DIAGNOSIS — Z89411 Acquired absence of right great toe: Secondary | ICD-10-CM

## 2015-07-04 DIAGNOSIS — G894 Chronic pain syndrome: Secondary | ICD-10-CM | POA: Diagnosis present

## 2015-07-04 DIAGNOSIS — I76 Septic arterial embolism: Secondary | ICD-10-CM

## 2015-07-04 DIAGNOSIS — I272 Other secondary pulmonary hypertension: Secondary | ICD-10-CM | POA: Diagnosis present

## 2015-07-04 DIAGNOSIS — I2690 Septic pulmonary embolism without acute cor pulmonale: Secondary | ICD-10-CM

## 2015-07-04 DIAGNOSIS — M659 Synovitis and tenosynovitis, unspecified: Secondary | ICD-10-CM | POA: Diagnosis present

## 2015-07-04 DIAGNOSIS — I82591 Chronic embolism and thrombosis of other specified deep vein of right lower extremity: Secondary | ICD-10-CM | POA: Diagnosis present

## 2015-07-04 DIAGNOSIS — Z8619 Personal history of other infectious and parasitic diseases: Secondary | ICD-10-CM | POA: Diagnosis not present

## 2015-07-04 DIAGNOSIS — M86132 Other acute osteomyelitis, left radius and ulna: Secondary | ICD-10-CM | POA: Diagnosis present

## 2015-07-04 DIAGNOSIS — I824Y9 Acute embolism and thrombosis of unspecified deep veins of unspecified proximal lower extremity: Secondary | ICD-10-CM | POA: Diagnosis not present

## 2015-07-04 DIAGNOSIS — S52532A Colles' fracture of left radius, initial encounter for closed fracture: Secondary | ICD-10-CM | POA: Diagnosis not present

## 2015-07-04 DIAGNOSIS — Z79899 Other long term (current) drug therapy: Secondary | ICD-10-CM | POA: Diagnosis not present

## 2015-07-04 DIAGNOSIS — M86671 Other chronic osteomyelitis, right ankle and foot: Secondary | ICD-10-CM

## 2015-07-04 DIAGNOSIS — M009 Pyogenic arthritis, unspecified: Secondary | ICD-10-CM | POA: Diagnosis present

## 2015-07-04 DIAGNOSIS — L02512 Cutaneous abscess of left hand: Secondary | ICD-10-CM | POA: Diagnosis present

## 2015-07-04 DIAGNOSIS — Z96653 Presence of artificial knee joint, bilateral: Secondary | ICD-10-CM | POA: Diagnosis present

## 2015-07-04 DIAGNOSIS — M79601 Pain in right arm: Secondary | ICD-10-CM | POA: Diagnosis not present

## 2015-07-04 DIAGNOSIS — M71032 Abscess of bursa, left wrist: Secondary | ICD-10-CM

## 2015-07-04 DIAGNOSIS — I330 Acute and subacute infective endocarditis: Secondary | ICD-10-CM | POA: Diagnosis not present

## 2015-07-04 DIAGNOSIS — Z87891 Personal history of nicotine dependence: Secondary | ICD-10-CM

## 2015-07-04 DIAGNOSIS — L03012 Cellulitis of left finger: Secondary | ICD-10-CM | POA: Diagnosis present

## 2015-07-04 DIAGNOSIS — M868X3 Other osteomyelitis, forearm: Secondary | ICD-10-CM | POA: Diagnosis present

## 2015-07-04 DIAGNOSIS — S62102G Fracture of unspecified carpal bone, left wrist, subsequent encounter for fracture with delayed healing: Secondary | ICD-10-CM

## 2015-07-04 DIAGNOSIS — Z01818 Encounter for other preprocedural examination: Secondary | ICD-10-CM

## 2015-07-04 DIAGNOSIS — R6521 Severe sepsis with septic shock: Secondary | ICD-10-CM

## 2015-07-04 DIAGNOSIS — I96 Gangrene, not elsewhere classified: Secondary | ICD-10-CM

## 2015-07-04 MED ORDER — OXYCODONE HCL 5 MG PO TABS
30.0000 mg | ORAL_TABLET | Freq: Once | ORAL | Status: AC
  Administered 2015-07-04: 30 mg via ORAL
  Filled 2015-07-04: qty 6

## 2015-07-04 NOTE — Progress Notes (Signed)
Direct admission from Dr. Lattie HawVanDam's office.   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 Izora Ribasoley on 04/23/15. Remains on IV ancef as outpatient.  Now with decreased sensation in left hand for a week.  outpt MRI of left wrist and hand could not be scheduled for another 10 days.  Dr. Algis LimingVanDam is concerned about septic joint, osteomyelitis and requests admission for MRI left wrist and hand and if abnormal, consulting Dr. Izora Ribasoley.  VSS. Accepted for observation to medsurg.  May call Dr. Algis LimingVanDam with any questions: [phone].  NURSING: CALL FLOW MANAGER ON ARRIVAL TO UNIT. SHE WILL PAGE HOSPITALIST TO WRITE ORDERS AND EXAMINE THE PATIENT.  Craig Curborinna Keaisha Sublette, MD [facility]

## 2015-07-04 NOTE — Progress Notes (Signed)
Chief complaint: "left wrist pain and warmth, now fingers are going numb"  Subjective:    Patient ID: [name], male    DOB: 1956-02-24, 59 y.o.   MRN: [id number]  HPI   [name] is an 59 y.o. 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 and later by plastics later to cover wound.    Patient also developed a left hand abscess and MRI done on 04/07/15 showed:  1. Dorsal distal radius transverse fracture, partially visible. 2. Diffuse edema of the hand, likely reactive to distal radius fracture. 3. Tear of the dorsal band of the scapholunate ligament. 4. Multiple small deep fluid collections in the hands are most compatible with hematoma given the heterogeneous T2 signal. In this patient with an elevated white blood cell count, superimposed infection is possible but this would be rare and in the setting of wrist trauma, ordinary hematoma is most likely.   He had I and D of hand abscess by Dr. Lenon Curt on 04/23/2015. Patient had remained on IV cefazolin since 04/10/15 and active abx since 04/06/15.  Despite this he had  been having increasing pain in his wrist, particular with increase in physical activity. Rehab MDs were concenred and ordered MRI on 05/11/15 which showed :  edema in the distal radius, ulna,all of the carpal bones and bases of the metacarpals.  Extensive soft tissue swelling and marked synovial thickening are seen throughout the carpus. 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  Dr. Lenon Curt was reconsulted and saw the pt on October 15th and he felt that the MRI was of poor quality but there is no evidence of necrosis. He did not feel the patient  needed surgery at this point in time rather needed wrist immobilization with a splint.   Of note the patient also has thombotic lesions in his foot from his infected mycotic aneurysm some of which are necrotic and will likely require amputation ultimately. The right great toe had some purulent drainage from the site.   I had discussions with Dr Lenon Curt who as mentioned did not feel there was compelling evidence for osteomyelitis in wrist in need of surgery and we planned further protracted IV abx followed by repeat MRI 2-4 weeks from the one in mid October. Unfortunately this was not done and patient was DC from rehab on the 29th of October. He saw me on November 5th and had in the meantime remained on high dose ancef.   His left wrist pain persisted and was WORSE with worsening redness and swelling. His ESR on blood work done by [facility] has risen further to 120.  Of not he also did have amputation of 2 toes by Dr Sharol Given during hospital stay and has been followed by Dr. Sharol Given. He had developed new ulcer on the heel on the right side.  He also has persistent lesion on third toe (see pictures)  He has initially had not  been seen by Dr. Lenon Curt because he was told by front office staff that he needed a referral?--despite fact that he had been operated upon!  As mentioned I saw the patient and ordered an MRI as an outpatient and we endeavored to get pt seen by Dr. Lenon Curt vs refer  to a different Copy. Unfortunately since then the MRI has still not been done though it is scheduled for ten days from now. Patient was referred to Dr Amedeo Plenty but was refused due to being Dr. Brennan Bailey patient. Patient himself tells me this and that he was eventually seen by Dr. Lenon Curt in his clinic and that repeat plain films were taken that per the patient showed dramatic differences between left and right wrists and Dr Lenon Curt saw there was dramatic changes on plain films. He tells me that Dr Lenon Curt wanted to wait upon the MRI before  acting surgically and that he was not sure if the findings were due to "old vs new infection."  In the interim pt has continued for yet another month on high dose ancef and despite this his wrist is in more pain.  In the last week his fingers have begun to go numb--something he experienced in last 7 days.  He  Is also having shaking of his hands when in a certain position.  I am very worried that the patients infection is progressing even further. I have little doubt that he has had osteomyelitis since his initial admission when he was bacteremic and that it first declared itself in mid October. At that time Dr Lenon Curt was not compelled by the evidence but I was hopeful that repeat MRI would either prove him or myself wrong. Again it was NOT done in timely manner and I am afraid further destruction has continued. He is now getting numb fingers and I think he is going to need AN OPERATION in the next 24 hours. I am having him admitted to [facility] for MRI and then orthopedic hand surgery consultation.    Past Medical History  Diagnosis Date  . Hypertension   . Chronic knee pain   . Hyperlipemia   . PUD (peptic ulcer disease)   . Dysrhythmia   . Peripheral vascular disease ([place])   . Anxiety   . GERD (gastroesophageal reflux disease)   . 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 Foot osteomyelitis, right ([place]) 06/05/2015  . Wound of right ankle 06/05/2015    Past Surgical History  Procedure Laterality Date  . I&d extremity Left 07/12/2013    Procedure: I&D Left Thigh Abscess;  Surgeon: Wylene Simmer, MD;  Location: [place];  Service: Orthopedics;  Laterality: Left;  . Replacement total knee bilateral    . Tonsillectomy    . Hernia repair    . Cholecystectomy    . Appendectomy    . Shoulder surgery    . Elbow surgery    . Femoral artery exploration Right 04/11/2015    Procedure: Resection of infected right femoral artery aneurysm;  Surgeon: Conrad Toombs, MD;  Location: [place];  Service:  Vascular;  Laterality: Right;  . Incision and drainage abscess Right 04/11/2015    Procedure: INCISION AND DRAINAGE OF RIGHT THIGH ABSCESS;  Surgeon: Conrad Whitehaven, MD;  Location: [place];  Service: Vascular;  Laterality: Right;  . Incision and drainage abscess Right 04/13/2015    Procedure: INCISION AND DRAINAGE THIGH ABSCESS;  Surgeon: Conrad Trout Valley, MD;  Location: [place];  Service: Vascular;  Laterality: Right;  . I&d extremity Left 04/17/2015    Procedure: IRRIGATION AND DEBRIDEMENT LEFT EXTREMITY;  Surgeon: Conrad McCone, MD;  Location: [place];  Service: Vascular;  Laterality: Left;  Marland Kitchen Muscle flap closure Right 04/19/2015    Procedure: RIGHT RECTUS ABDOMINUS  FLAP TO RIGHT GROIN;  Surgeon: Arnoldo Hooker  Iran Planas, MD;  Location: [place];  Service: Plastics;  Laterality: Right;  . Femoral revision Right 04/19/2015    Procedure: OVERSEW RIGHT FEMORAL VEIN ;  Surgeon: Angelia Mould, MD;  Location: [place];  Service: Vascular;  Laterality: Right;  . Wound exploration Right 04/23/2015    Procedure: EXAM UNDER ANESTHESIA AND VAC CHANGE RIGHT THIGH;  Surgeon: Irene Limbo, MD;  Location: [place];  Service: Plastics;  Laterality: Right;  . Amputation Right 05/17/2015    Procedure: Right 1st and 2nd Toe Amputation;  Surgeon: Newt Minion, MD;  Location: [place];  Service: Orthopedics;  Laterality: Right;    Family History  Problem Relation Age of Onset  . Heart attack Paternal Grandmother 36  . Heart disease Mother 35    arrythmia  . Stroke Paternal Grandfather   . Stroke Maternal Grandfather       Social History   Social History  . Marital Status: Married    Spouse Name: N/A  . Number of Children: 3  . Years of Education: N/A   Occupational History  .      Sales   Social History Main Topics  . Smoking status: Former Smoker    Types: Cigarettes  . Smokeless tobacco: Never Used  . Alcohol Use: No  . Drug Use: No  . Sexual Activity: Not on file   Other Topics Concern  . Not on file    Social History Narrative    Allergies  Allergen Reactions  . Imitrex [Sumatriptan] Other (See Comments)    Chest pain, 2004 (tolerates 50 mg prn)  . Verapamil Other (See Comments)    Causes pvc's     Current outpatient prescriptions:  .  albuterol (PROAIR HFA) 108 (90 BASE) MCG/ACT inhaler, Inhale 1 puff into the lungs every 6 (six) hours as needed for wheezing or shortness of breath. , Disp: , Rfl:  .  atorvastatin (LIPITOR) 80 MG tablet, Take 1 tablet (80 mg total) by mouth daily., Disp: 30 tablet, Rfl: 1 .  ceFAZolin (ANCEF) 2-3 GM-% SOLR, IV Ancef 2 g every 8 hours until 07/05/2015 and stop, Disp: , Rfl:  .  docusate sodium (COLACE) 100 MG capsule, Take 1 capsule (100 mg total) by mouth 2 (two) times daily., Disp: 10 capsule, Rfl: 0 .  famciclovir (FAMVIR) 500 MG tablet, Take 500 mg by mouth 2 (two) times daily as needed (fever blisters). , Disp: , Rfl:  .  flecainide (TAMBOCOR) 100 MG tablet, Take 1 tablet (100 mg total) by mouth every 12 (twelve) hours., Disp: 60 tablet, Rfl: 1 .  gabapentin (NEURONTIN) 100 MG capsule, Take 1 capsule (100 mg total) by mouth 3 (three) times daily., Disp: 90 capsule, Rfl: 1 .  methocarbamol (ROBAXIN) 500 MG tablet, Take 1 tablet (500 mg total) by mouth every 6 (six) hours as needed for muscle spasms., Disp: 60 tablet, Rfl: 0 .  Metoprolol Tartrate 75 MG TABS, Take 75 mg by mouth 2 (two) times daily., Disp: 60 tablet, Rfl: 1 .  morphine (MS CONTIN) 30 MG 12 hr tablet, Take 3 tablets (90 mg total) by mouth every 12 (twelve) hours., Disp: 60 tablet, Rfl: 0 .  Multiple Vitamin (MULTIVITAMIN WITH MINERALS) TABS tablet, Take 1 tablet by mouth daily., Disp: , Rfl:  .  naloxegol oxalate (MOVANTIK) 25 MG TABS tablet, Take 1 tablet (25 mg total) by mouth daily before breakfast., Disp: 30 tablet, Rfl: 0 .  naproxen (NAPROSYN) 500 MG tablet, Take 1 tablet (500 mg total)  by mouth 2 (two) times daily with a meal., Disp: 60 tablet, Rfl: 1 .  oxycodone (ROXICODONE)  30 MG immediate release tablet, Take 1 tablet (30 mg total) by mouth every 6 (six) hours. scheduled, Disp: 90 tablet, Rfl: 0 .  polyethylene glycol (MIRALAX / GLYCOLAX) packet, Take 17 g by mouth daily., Disp: 14 each, Rfl: 0 .  SUMAtriptan (IMITREX) 50 MG tablet, Take 50 mg by mouth daily as needed for migraine or headache. Maximum 2 doses in 2 days, Disp: , Rfl: 0 .  trifluridine (VIROPTIC) 1 % ophthalmic solution, Place 1 drop into both eyes 2 (two) times daily as needed (breakouts (blurry, matted eyelids))., Disp: , Rfl:  .  venlafaxine XR (EFFEXOR-XR) 75 MG 24 hr capsule, Take 1 capsule (75 mg total) by mouth daily with breakfast., Disp: 30 capsule, Rfl: 1 .  warfarin (COUMADIN) 5 MG tablet, 1 tablet Mondays and Thursdays. 1-1/2 tablet Sunday Tuesday Wednesday Friday Saturday, Disp: 90 tablet, Rfl: 1 .  [DISCONTINUED] diphenhydrAMINE (BENADRYL) 25 MG tablet, Take 1 tablet (25 mg total) by mouth every 6 (six) hours. (Patient not taking: Reported on 02/25/2015), Disp: 20 tablet, Rfl: 0 .  [DISCONTINUED] pantoprazole (PROTONIX) 40 MG tablet, Take 1 tablet (40 mg total) by mouth daily. (Patient taking differently: Take 40 mg by mouth daily as needed (heartburn). ), Disp: 30 tablet, Rfl: 0   Review of Systems  Constitutional: Negative for fever, chills, diaphoresis, activity change, appetite change, fatigue and unexpected weight change.  HENT: Negative for congestion, rhinorrhea, sinus pressure, sneezing, sore throat and trouble swallowing.   Eyes: Negative for photophobia and visual disturbance.  Respiratory: Negative for cough, chest tightness, shortness of breath, wheezing and stridor.   Cardiovascular: Negative for chest pain, palpitations and leg swelling.  Gastrointestinal: Negative for nausea, vomiting, abdominal pain, diarrhea, constipation, blood in stool, abdominal distention and anal bleeding.  Genitourinary: Negative for dysuria, hematuria, flank pain and difficulty urinating.   Musculoskeletal: Positive for myalgias, joint swelling and arthralgias. Negative for back pain and gait problem.  Skin: Positive for wound. Negative for color change, pallor and rash.  Neurological: Positive for numbness. Negative for dizziness, tremors, weakness and light-headedness.  Hematological: Negative for adenopathy. Does not bruise/bleed easily.  Psychiatric/Behavioral: Negative for behavioral problems, confusion, sleep disturbance, dysphoric mood, decreased concentration and agitation.       Objective:   Physical Exam  Constitutional: He is oriented to person, place, and time. He appears well-developed and well-nourished.  HENT:  Head: Normocephalic and atraumatic.  Eyes: Conjunctivae and EOM are normal.  Neck: Normal range of motion. Neck supple.  Cardiovascular: Normal rate, regular rhythm and normal heart sounds.  Exam reveals no gallop and no friction rub.   No murmur heard. Pulmonary/Chest: He has no wheezes.  Abdominal: Soft. He exhibits no distension.  Neurological: He is alert and oriented to person, place, and time.  Skin: Skin is warm and dry.  Psychiatric: His behavior is normal. Judgment and thought content normal. His mood appears anxious.   Left wrist October 14th, 2016       Left wrist November 9th, 2016:    Left wrist 07/04/15: warm tender increased numbness in first 3 fingers      Right toes October 2016 preop        Right foot November 9th postop:        Right foot 07/04/15:       Right ankle wound 06/05/15:    Not examined today  Left prosthetic knee site 06/05/15:  clean     VVS operative site 06/05/15:    Other surgical sites were clean today      Assessment & Plan:   MSSA bacteremia infected mycotic aneurysm and thigh abscess sp  surgery by vascular surgery then plastic surgeries also with fractured wrist and abscess in hand status post I&D of hand abscess, thrombotic lesions to toes where he has chronic  wounds and areas that  with MRI showing evidence of septic arthritis and osteomyelitis in his left wrist in October but no surgery performed (see above) with amputations of 2 toes by Dr. Sharol Given protracted IV abx but despite this worsening and persistence of wrist pain, edema, and warmth a month ago and now worsening through today with fingers going numb.   #1 Left wrist with increasing pain numbness in several fingers: I am afraid he has VERY morbid pathology in hand and wrist and needs expedited surgery. He CANNOT wait another 10 days for an MRI and further for surgery  I am admitting him to [facility] and Dr. Conley Canal has agreed to have patient admitted.  We have called the bed control and he is #3 in line for bed. Bed control has patient number and will call him as soon as bed is available. My clinic is closing as of time of this note being written (30 minutes ago)  --TONIGHT HE NEEDS MRI WITH AND WITHOUT CONTRAST OF HAND AND WRIST --his labs from [facility] this week showed his BMP with NORMAL Cr of 0.72 --His ESR is still high at 56 and CRP at 14.9. His hemoglobin is 10.2, platelets are normal at 275.  --Clearly this man need surgery and I think he should have it within the next 24 hours  NOTE he does NOT need DIFFERENT antibiotics UNLESS an intra-operative culture discloses a new pathogen this is without question a morbid complication from his SAB  I will add him to the ID consult list for [facility] and inform Dr. Johnnye Sima that he is coming in  I am happy to discuss case and call Dr Lenon Curt if need be. If he is not available or partner not covering then would call Ortho hand on call  CONSEQUENCES of failing to operate on this septic joint are not limited to morbidity to the joint itself but pos risk of recurrent bacteremia with potential to seed his left THA, his heart valves and his repaired vascular sites   #2 Persistent abnormality on 3rd digit and new ulcer ankle after 2 toe amptution by Dr.  Sharol Given --I did not examine this today but it needs re-exmamining and likely an MRI  of the ankle and foot   #3 MSSA bacteremia with mycotic aneurysm: he never had TEE before and had all 4 arrhythmias I would at minimum consider getting TTE repeat  I spent greater than 40 minutes with the patient including greater than 50% of time in face to face counsel of the patient re his  septic wrist with osteomyelitis, his infected aneurysm, MSSA bacteremia, osteo of his toes, persistent lesion there  and new ulcer and in coordination of his care.

## 2015-07-05 ENCOUNTER — Inpatient Hospital Stay (HOSPITAL_COMMUNITY): Payer: Medicaid Other

## 2015-07-05 ENCOUNTER — Encounter (HOSPITAL_COMMUNITY): Payer: Self-pay | Admitting: *Deleted

## 2015-07-05 DIAGNOSIS — I480 Paroxysmal atrial fibrillation: Secondary | ICD-10-CM | POA: Insufficient documentation

## 2015-07-05 DIAGNOSIS — G894 Chronic pain syndrome: Secondary | ICD-10-CM

## 2015-07-05 DIAGNOSIS — M86132 Other acute osteomyelitis, left radius and ulna: Principal | ICD-10-CM

## 2015-07-05 DIAGNOSIS — M869 Osteomyelitis, unspecified: Secondary | ICD-10-CM | POA: Diagnosis present

## 2015-07-05 LAB — COMPREHENSIVE METABOLIC PANEL
ALT: 10 U/L — AB (ref 17–63)
AST: 20 U/L (ref 15–41)
Albumin: 3.6 g/dL (ref 3.5–5.0)
Alkaline Phosphatase: 96 U/L (ref 38–126)
Anion gap: 9 (ref 5–15)
BILIRUBIN TOTAL: 0.3 mg/dL (ref 0.3–1.2)
BUN: 14 mg/dL (ref 6–20)
CALCIUM: 9.1 mg/dL (ref 8.9–10.3)
CO2: 22 mmol/L (ref 22–32)
CREATININE: 0.77 mg/dL (ref 0.61–1.24)
Chloride: 104 mmol/L (ref 101–111)
GFR calc Af Amer: >60 mL/min (ref >60)
Glucose, Bld: 110 mg/dL — ABNORMAL HIGH (ref 65–99)
Potassium: 3.9 mmol/L (ref 3.5–5.1)
Sodium: 135 mmol/L (ref 135–145)
TOTAL PROTEIN: 7.9 g/dL (ref 6.5–8.1)

## 2015-07-05 LAB — CBC WITH DIFFERENTIAL/PLATELET
BASOS ABS: 0 10*3/uL (ref 0.0–0.1)
BASOS PCT: 0 %
EOS PCT: 2 %
Eosinophils Absolute: 0.2 10*3/uL (ref 0.0–0.7)
HCT: 37.5 % — ABNORMAL LOW (ref 39.0–52.0)
Hemoglobin: 11.3 g/dL — ABNORMAL LOW (ref 13.0–17.0)
LYMPHS PCT: 31 %
Lymphs Abs: 3.5 10*3/uL (ref 0.7–4.0)
MCH: 25.7 pg — ABNORMAL LOW (ref 26.0–34.0)
MCHC: 30.1 g/dL (ref 30.0–36.0)
MCV: 85.2 fL (ref 78.0–100.0)
MONO ABS: 1.2 10*3/uL — AB (ref 0.1–1.0)
Monocytes Relative: 11 %
Neutro Abs: 6.1 10*3/uL (ref 1.7–7.7)
Neutrophils Relative %: 56 %
PLATELETS: 318 10*3/uL (ref 150–400)
RBC: 4.4 MIL/uL (ref 4.22–5.81)
RDW: 17.3 % — AB (ref 11.5–15.5)
WBC: 11 10*3/uL — ABNORMAL HIGH (ref 4.0–10.5)

## 2015-07-05 LAB — HEPARIN LEVEL (UNFRACTIONATED)

## 2015-07-05 LAB — LACTIC ACID, PLASMA
LACTIC ACID, VENOUS: 1.2 mmol/L (ref 0.5–2.0)
Lactic Acid, Venous: 1.1 mmol/L (ref 0.5–2.0)

## 2015-07-05 LAB — C-REACTIVE PROTEIN: CRP: 5.6 mg/dL — AB (ref <1.0)

## 2015-07-05 LAB — PROTIME-INR
INR: 1.59 — ABNORMAL HIGH (ref 0.00–1.49)
Prothrombin Time: 19 seconds — ABNORMAL HIGH (ref 11.6–15.2)

## 2015-07-05 LAB — APTT: APTT: 39 s — AB (ref 24–37)

## 2015-07-05 LAB — BRAIN NATRIURETIC PEPTIDE: B Natriuretic Peptide: 20.4 pg/mL (ref 0.0–100.0)

## 2015-07-05 LAB — SEDIMENTATION RATE: SED RATE: 36 mm/h — AB (ref 0–16)

## 2015-07-05 LAB — MAGNESIUM: Magnesium: 1.9 mg/dL (ref 1.7–2.4)

## 2015-07-05 LAB — TSH: TSH: 3.67 u[IU]/mL (ref 0.350–4.500)

## 2015-07-05 LAB — TROPONIN I: Troponin I: <0.03 ng/mL (ref <0.031)

## 2015-07-05 MED ORDER — SODIUM CHLORIDE 0.9 % IJ SOLN
3.0000 mL | Freq: Two times a day (BID) | INTRAMUSCULAR | Status: DC
  Administered 2015-07-05 – 2015-07-06 (×2): 3 mL via INTRAVENOUS

## 2015-07-05 MED ORDER — MORPHINE SULFATE ER 30 MG PO TBCR
90.0000 mg | EXTENDED_RELEASE_TABLET | Freq: Two times a day (BID) | ORAL | Status: DC
  Administered 2015-07-05 – 2015-07-11 (×14): 90 mg via ORAL
  Filled 2015-07-05 (×14): qty 3

## 2015-07-05 MED ORDER — ADULT MULTIVITAMIN W/MINERALS CH
1.0000 | ORAL_TABLET | Freq: Every day | ORAL | Status: DC
  Administered 2015-07-05 – 2015-07-11 (×6): 1 via ORAL
  Filled 2015-07-05 (×6): qty 1

## 2015-07-05 MED ORDER — HEPARIN (PORCINE) IN NACL 100-0.45 UNIT/ML-% IJ SOLN
2400.0000 [IU]/h | INTRAMUSCULAR | Status: DC
  Administered 2015-07-05: 1700 [IU]/h via INTRAVENOUS
  Administered 2015-07-06 – 2015-07-07 (×2): 2000 [IU]/h via INTRAVENOUS
  Filled 2015-07-05 (×3): qty 250

## 2015-07-05 MED ORDER — VENLAFAXINE HCL ER 75 MG PO CP24
75.0000 mg | ORAL_CAPSULE | Freq: Every day | ORAL | Status: DC
  Administered 2015-07-05 – 2015-07-11 (×7): 75 mg via ORAL
  Filled 2015-07-05 (×8): qty 1

## 2015-07-05 MED ORDER — ALBUTEROL SULFATE (2.5 MG/3ML) 0.083% IN NEBU: 2.5000 mg | INHALATION_SOLUTION | Freq: Four times a day (QID) | RESPIRATORY_TRACT | Status: DC | PRN

## 2015-07-05 MED ORDER — POTASSIUM CHLORIDE IN NACL 20-0.9 MEQ/L-% IV SOLN
INTRAVENOUS | Status: AC
  Administered 2015-07-05: 05:00:00 via INTRAVENOUS
  Filled 2015-07-05: qty 1000

## 2015-07-05 MED ORDER — TEMAZEPAM 7.5 MG PO CAPS
15.0000 mg | ORAL_CAPSULE | Freq: Every day | ORAL | Status: DC
  Administered 2015-07-05: 30 mg via ORAL
  Administered 2015-07-05: 15 mg via ORAL
  Administered 2015-07-06 – 2015-07-10 (×5): 30 mg via ORAL
  Filled 2015-07-05 (×2): qty 2
  Filled 2015-07-05 (×2): qty 4
  Filled 2015-07-05: qty 2
  Filled 2015-07-05 (×2): qty 4
  Filled 2015-07-05 (×2): qty 2

## 2015-07-05 MED ORDER — HEPARIN BOLUS VIA INFUSION
2000.0000 [IU] | Freq: Once | INTRAVENOUS | Status: AC
  Administered 2015-07-05: 2000 [IU] via INTRAVENOUS
  Filled 2015-07-05: qty 2000

## 2015-07-05 MED ORDER — SODIUM CHLORIDE 0.9 % IJ SOLN
10.0000 mL | INTRAMUSCULAR | Status: DC | PRN
  Administered 2015-07-09: 10 mL
  Administered 2015-07-10 – 2015-07-11 (×3): 20 mL
  Filled 2015-07-05 (×4): qty 40

## 2015-07-05 MED ORDER — ATORVASTATIN CALCIUM 80 MG PO TABS
80.0000 mg | ORAL_TABLET | Freq: Every day | ORAL | Status: DC
  Administered 2015-07-05 – 2015-07-10 (×6): 80 mg via ORAL
  Filled 2015-07-05 (×6): qty 1

## 2015-07-05 MED ORDER — METOPROLOL TARTRATE 25 MG PO TABS
75.0000 mg | ORAL_TABLET | Freq: Two times a day (BID) | ORAL | Status: DC
  Administered 2015-07-05 – 2015-07-11 (×13): 75 mg via ORAL
  Filled 2015-07-05 (×14): qty 3

## 2015-07-05 MED ORDER — HYDROCERIN EX CREA
TOPICAL_CREAM | Freq: Every day | CUTANEOUS | Status: DC
  Administered 2015-07-05: 16:00:00 via TOPICAL
  Administered 2015-07-06: 1 via TOPICAL
  Administered 2015-07-07 – 2015-07-11 (×5): via TOPICAL
  Filled 2015-07-05: qty 113

## 2015-07-05 MED ORDER — HEPARIN (PORCINE) IN NACL 100-0.45 UNIT/ML-% IJ SOLN
1400.0000 [IU]/h | INTRAMUSCULAR | Status: DC
  Administered 2015-07-05: 1400 [IU]/h via INTRAVENOUS
  Filled 2015-07-05: qty 250

## 2015-07-05 MED ORDER — TRIFLURIDINE 1 % OP SOLN: 1.0000 [drp] | Freq: Two times a day (BID) | OPHTHALMIC | Status: DC | PRN

## 2015-07-05 MED ORDER — MORPHINE SULFATE (PF) 2 MG/ML IV SOLN
2.0000 mg | INTRAVENOUS | Status: DC | PRN
  Administered 2015-07-05 – 2015-07-09 (×4): 2 mg via INTRAVENOUS
  Filled 2015-07-05 (×4): qty 1

## 2015-07-05 MED ORDER — GADOBENATE DIMEGLUMINE 529 MG/ML IV SOLN
20.0000 mL | Freq: Once | INTRAVENOUS | Status: AC
  Administered 2015-07-05: 20 mL via INTRAVENOUS

## 2015-07-05 MED ORDER — POLYETHYLENE GLYCOL 3350 17 G PO PACK
17.0000 g | PACK | Freq: Every day | ORAL | Status: DC
  Administered 2015-07-08: 17 g via ORAL
  Filled 2015-07-05 (×5): qty 1

## 2015-07-05 MED ORDER — DULOXETINE HCL 30 MG PO CPEP
30.0000 mg | ORAL_CAPSULE | Freq: Every day | ORAL | Status: DC
  Administered 2015-07-05 – 2015-07-11 (×7): 30 mg via ORAL
  Filled 2015-07-05 (×7): qty 1

## 2015-07-05 MED ORDER — NALOXEGOL OXALATE 25 MG PO TABS
25.0000 mg | ORAL_TABLET | Freq: Every day | ORAL | Status: DC
  Administered 2015-07-05 – 2015-07-11 (×7): 25 mg via ORAL
  Filled 2015-07-05 (×9): qty 1

## 2015-07-05 MED ORDER — OXYCODONE HCL 5 MG PO TABS
30.0000 mg | ORAL_TABLET | Freq: Four times a day (QID) | ORAL | Status: DC | PRN
  Administered 2015-07-05 – 2015-07-11 (×21): 30 mg via ORAL
  Filled 2015-07-05 (×22): qty 6

## 2015-07-05 MED ORDER — DOCUSATE SODIUM 100 MG PO CAPS
100.0000 mg | ORAL_CAPSULE | Freq: Two times a day (BID) | ORAL | Status: DC
  Administered 2015-07-06 – 2015-07-11 (×8): 100 mg via ORAL
  Filled 2015-07-05 (×10): qty 1

## 2015-07-05 MED ORDER — VALACYCLOVIR HCL 500 MG PO TABS: 1000.0000 mg | ORAL_TABLET | Freq: Every day | ORAL | Status: DC

## 2015-07-05 MED ORDER — FLECAINIDE ACETATE 100 MG PO TABS
100.0000 mg | ORAL_TABLET | Freq: Two times a day (BID) | ORAL | Status: DC
  Administered 2015-07-05 – 2015-07-11 (×14): 100 mg via ORAL
  Filled 2015-07-05 (×16): qty 1

## 2015-07-05 MED ORDER — GABAPENTIN 100 MG PO CAPS
100.0000 mg | ORAL_CAPSULE | Freq: Three times a day (TID) | ORAL | Status: DC
  Administered 2015-07-05 – 2015-07-11 (×20): 100 mg via ORAL
  Filled 2015-07-05 (×20): qty 1

## 2015-07-05 MED ORDER — CEFAZOLIN SODIUM-DEXTROSE 2-3 GM-% IV SOLR
2.0000 g | Freq: Three times a day (TID) | INTRAVENOUS | Status: DC
Start: 2015-07-05 — End: 2015-07-06
  Administered 2015-07-05 – 2015-07-06 (×4): 2 g via INTRAVENOUS
  Filled 2015-07-05 (×6): qty 50

## 2015-07-05 NOTE — H&P (Signed)
[facility] History and Physical  Craig MunchJimmy F Taft ZOX:[id number] DOB: 05/29/1956 DOA: 07/04/2015  Referring physician: ED physician PCP: Patria ManeGASSEMI, MIKE, MD  Specialists: Dr. Daiva EvesVan Dam (ID), Dr. Izora Ribasoley (Ortho)   Chief Complaint:   HPI: [name] is a 59 y.o. male with PMH of paroxysmal atrial fibrillation, chronic diastolic CHF, pulmonary hypertension, and a very complicated course since September this year with bacteremia, abscesses, likely osteomyelitis, hematomas and fracture of the left distal radius, now presenting at the direction of his infectious disease physician, Dr. Daiva EvesVan Dam, for evaluation and management of suspected osteomyelitis of the left radius and surrounding soft tissue infection. The patient describes inadvertently administrate his testosterone injection into his right femoral artery back in September this year, which led to a large hematoma and secondary infection with bacteremia requiring multiple surgeries for resection of tissue and debridement, and eventual closure by plastics. He describes attempting to ambulate between surgeries on his right thigh and falling onto an outstretched left arm, fracturing the distal radius. There was an MRI performed on the right forearm with multiple small deep fluid collections that were suspected to represent hematomas. Over time, the infectious disease physician saw changes in the left forearm that he believed to represent superimposed bacterial infection. The patient has been on high dose IV cefazolin since September, but unfortunately he has continued to worsen. In addition to pain, swelling, erythema, and tenderness of the left forearm and full R aspect of the hand, the patient has developed loss of sensation in the first 3 digits over the week leading up to his admission. He saw Dr. Daiva EvesVan Dam in his clinic and was directed to the hospital for MRI and likely surgical consultation pending imaging results.  Upon his presentation the patient is  nontoxic in appearance and afebrile.  Where does patient live?   At home   SNF    Assistant living facility   [facility]irement center Can patient participate in ADLs?  Yes     Barely     None  Little     Some   Review of Systems:   General: no fevers, chills, sweats, poor appetite, or fatigue HEENT: no blurry vision, hearing changes or sore throat Pulm: no dyspnea, cough, or wheeze CV: no chest pain or palpitations Abd: no nausea, vomiting, abdominal pain, diarrhea, or constipation GU: no dysuria, hematuria, increased urinary frequency, or urgency  Ext: leg edema bilaterally, left forearm pain, swelling, erythema, and heat. Denies discharge or foul odor. Neuro:  no focal weakness, numbness, or tingling, no vision change or hearing loss Skin: no rash, no wounds MSK: No muscle spasm, no deformity, no red, hot, or swollen joint Heme: No easy bruising or bleeding Travel history: No recent long distant travel    Allergy:  Allergies  Allergen Reactions  . Imitrex [Sumatriptan] Other (See Comments)    Chest pain, 2004 (tolerates 50 mg prn)  . Verapamil Other (See Comments)    Causes pvc's    Past Medical History  Diagnosis Date  . Hypertension   . Chronic knee pain   . Hyperlipemia   . PUD (peptic ulcer disease)   . Dysrhythmia   . Peripheral vascular disease (HCC)   . Anxiety   . GERD (gastroesophageal reflux disease)   . 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 Wound of right ankle 06/05/2015    Past Surgical History  Procedure Laterality Date  . I&d extremity Left 07/12/2013    Procedure:  I&D Left Thigh Abscess;  Surgeon: [name], MD;  Location: [facility] OR;  Service: Orthopedics;  Laterality: Left;  . Replacement total knee bilateral    . Tonsillectomy    . Hernia repair    . Cholecystectomy    . Appendectomy    . Shoulder surgery    . Elbow surgery    . 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 Incision and drainage abscess Right 04/11/2015    Procedure: INCISION AND DRAINAGE OF RIGHT THIGH ABSCESS;  Surgeon: Fransisco Hertz, MD;  Location: [facility] OR;  Service: Vascular;  Laterality: Right;  . Incision and drainage abscess Right 04/13/2015    Procedure: INCISION AND DRAINAGE THIGH ABSCESS;  Surgeon: Fransisco Hertz, MD;  Location: [facility] OR;  Service: Vascular;  Laterality: Right;  . I&d extremity Left 04/17/2015    Procedure: IRRIGATION AND DEBRIDEMENT LEFT EXTREMITY;  Surgeon: Fransisco Hertz, MD;  Location: [facility] OR;  Service: Vascular;  Laterality: Left;  Marland Kitchen Muscle flap closure Right 04/19/2015    Procedure: RIGHT RECTUS ABDOMINUS  FLAP TO RIGHT GROIN;  Surgeon: Glenna Fellows, MD;  Location: [facility] OR;  Service: Plastics;  Laterality: Right;  . Femoral revision Right 04/19/2015    Procedure: OVERSEW RIGHT FEMORAL VEIN ;  Surgeon: Chuck Hint, MD;  Location: [facility] OR;  Service: Vascular;  Laterality: Right;  . Wound exploration Right 04/23/2015    Procedure: EXAM UNDER ANESTHESIA AND VAC CHANGE RIGHT THIGH;  Surgeon: Glenna Fellows, MD;  Location: [facility] OR;  Service: Plastics;  Laterality: Right;  . 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 His smoking use included Cigarettes. He has never used smokeless tobacco. He reports that he does not drink alcohol or use illicit drugs.  Family History:  Family History  Problem Relation Age of Onset  . Heart attack Paternal Grandmother 18  . Heart disease Mother 56    arrythmia  . Stroke Paternal Grandfather   . Stroke Maternal Grandfather      Prior to Admission medications   Medication Sig Start Date End Date Taking? Authorizing Provider  albuterol  (PROAIR HFA) 108 (90 BASE) MCG/ACT inhaler Inhale 1 puff into the lungs every 6 (six) hours as needed for wheezing or shortness of breath.  09/06/13  Yes Historical Provider, MD  atorvastatin (LIPITOR) 80 MG tablet Take 1 tablet (80 mg total) by mouth daily. 05/24/15  Yes Daniel J Angiulli, PA-C  ceFAZolin (ANCEF) 2-3 GM-% SOLR IV Ancef 2 g every 8 hours until 07/05/2015 and stop 05/24/15  Yes Daniel J Angiulli, PA-C  docusate sodium (COLACE) 100 MG capsule Take 1 capsule (100 mg total) by mouth 2 (two) times daily. 05/24/15  Yes Daniel J Angiulli, PA-C  DULoxetine (CYMBALTA) 30 MG capsule Take 60 mg by mouth daily at 12 noon. 06/25/15  Yes Historical Provider, MD  famciclovir (FAMVIR) 500 MG tablet Take 500 mg by mouth 2 (two) times daily as needed (fever blisters).  10/25/13  Yes Historical Provider, MD  flecainide (TAMBOCOR) 100 MG tablet Take 1 tablet (100 mg total) by mouth every 12 (twelve) hours. 05/24/15  Yes Daniel J Angiulli, PA-C  gabapentin (NEURONTIN)  100 MG capsule Take 1 capsule (100 mg total) by mouth 3 (three) times daily. 05/24/15  Yes Daniel J Angiulli, PA-C  Metoprolol Tartrate 75 MG TABS Take 75 mg by mouth 2 (two) times daily. 05/24/15  Yes Daniel J Angiulli, PA-C  morphine (MS CONTIN) 30 MG 12 hr tablet Take 3 tablets (90 mg total) by mouth every 12 (twelve) hours. 05/24/15  Yes Daniel J Angiulli, PA-C  Multiple Vitamin (MULTIVITAMIN WITH MINERALS) TABS tablet Take 1 tablet by mouth daily. 05/24/15  Yes Daniel J Angiulli, PA-C  naloxegol oxalate (MOVANTIK) 25 MG TABS tablet Take 1 tablet (25 mg total) by mouth daily before breakfast. 05/24/15  Yes Daniel J Angiulli, PA-C  oxycodone (ROXICODONE) 30 MG immediate release tablet Take 1 tablet (30 mg total) by mouth every 6 (six) hours. scheduled 05/24/15  Yes Daniel J Angiulli, PA-C  polyethylene glycol (MIRALAX / GLYCOLAX) packet Take 17 g by mouth daily. 05/24/15  Yes Daniel J Angiulli, PA-C  SUMAtriptan (IMITREX) 50 MG tablet Take 50  mg by mouth daily as needed for migraine or headache. Maximum 2 doses in 2 days 02/15/15  Yes Historical Provider, MD  temazepam (RESTORIL) 15 MG capsule Take 15-30 mg by mouth at bedtime. 06/25/15  Yes Historical Provider, MD  testosterone cypionate (DEPOTESTOSTERONE CYPIONATE) 200 MG/ML injection Inject 100 mg into the muscle every 14 (fourteen) days. 06/25/15  Yes Historical Provider, MD  trifluridine (VIROPTIC) 1 % ophthalmic solution Place 1 drop into both eyes 2 (two) times daily as needed (breakouts (blurry, matted eyelids)).   Yes Historical Provider, MD  venlafaxine XR (EFFEXOR-XR) 75 MG 24 hr capsule Take 1 capsule (75 mg total) by mouth daily with breakfast. 05/24/15  Yes Mcarthur Rossetti Angiulli, PA-C  warfarin (COUMADIN) 5 MG tablet 1 tablet Mondays and Thursdays. 1-1/2 tablet Sunday Tuesday Wednesday Friday Saturday Patient taking differently: Take 7.5-10 mg by mouth daily at 6 PM. Take 10 mg by mouth daily on Tuesday and Friday. Take 7.5 mg by mouth on all other days. 05/24/15  Yes Daniel J Angiulli, PA-C  methocarbamol (ROBAXIN) 500 MG tablet Take 1 tablet (500 mg total) by mouth every 6 (six) hours as needed for muscle spasms. Patient not taking: Reported on 07/04/2015 05/24/15   Mcarthur Rossetti Angiulli, PA-C  naproxen (NAPROSYN) 500 MG tablet Take 1 tablet (500 mg total) by mouth 2 (two) times daily with a meal. Patient not taking: Reported on 07/04/2015 05/24/15   Charlton Amor, PA-C    Physical Exam: Filed Vitals:   07/04/15 2035 07/04/15 2050 07/04/15 2316  BP:  134/80 133/74  Pulse:  95 92  Temp:  97.9 F (36.6 C)   TempSrc:  Oral   Resp:  17   Height:  (1.905 m)    Weight: 99.701 kg (219 lb 12.8 oz)    SpO2:  99%    General: Not in acute distress HEENT:       Eyes: PERRL, EOMI, no scleral icterus or conjunctival pallor.       ENT: No discharge from the ears or nose, no pharyngeal ulcers, petechiae or exudate, no tonsillar enlargement.        Neck: No JVD, no bruit, no  appreciable mass Heme: No cervical adenopathy, no pallor Cardiac: S1/S2,  Regular rhythm with rate approximately 80 and no significant murmur, No gallops or rubs. Pulm: Good air movement bilaterally. No rales, wheezing, rhonchi or rubs. Abd: Soft, nondistended, nontender, no rebound pain or gaurding, no mass or organomegaly, BS present.  Ext: Mild dependent edema of b/l legs without erythema, warmth, tenderness, or palpable cord. Distal left forearm and volar hand with gross deformities, edema, discoloration, and exquisite tenderness. Capillary refill is less than 2 seconds in all digits of the left hand, but sensation to light touch is absent in the second and third fingers.  Musculoskeletal:  aside from that described above, No gross deformity, no red, hot, swollen joints, no limitation in ROM  Skin:  aside from the extremity findings,No rashes or wounds on exposed surfaces  Neuro: Alert, oriented X3, cranial nerves II-XII grossly intact. Loss of sensation to light touch in the second and third digits of the left hand as described above.  Psych: Patient is not overtly psychotic.  Labs on Admission:  Basic Metabolic Panel: No results for input(s): NA, K, CL, CO2, GLUCOSE, BUN, CREATININE, CALCIUM, MG, PHOS in the last 168 hours. Liver Function Tests: No results for input(s): AST, ALT, ALKPHOS, BILITOT, PROT, ALBUMIN in the last 168 hours. No results for input(s): LIPASE, AMYLASE in the last 168 hours. No results for input(s): AMMONIA in the last 168 hours. CBC: No results for input(s): WBC, NEUTROABS, HGB, HCT, MCV, PLT in the last 168 hours. Cardiac Enzymes: No results for input(s): CKTOTAL, CKMB, CKMBINDEX, TROPONINI in the last 168 hours.  BNP (last 3 results) No results for input(s): BNP in the last 8760 hours.  ProBNP (last 3 results) No results for input(s): PROBNP in the last 8760 hours.  CBG: No results for input(s): GLUCAP in the last 168 hours.  Radiological Exams on  Admission: No results found.  EKG: Not done in ED, will obtain as appropriate   Assessment/Plan  1. Suspected osteomyelitis of the left radius - MRIs have been ordered and are pending - No sign of systemic infection, so observing off of antibiotics pending possible bone culture - Surgical consultation may be obtained in the morning pending clinical course and imaging results  2. Paroxysmal atrial fibrillation - Patient is on chronic Coumadin therapy with last dose taken the day before admission - Holding Coumadin for potential surgical intervention with plan to initiate heparin infusion once INR falls below 2. Pharmacy is been consulted for assistance with this - Regular rhythm noted on exam, EKG pending  3. Chronic deep vein thrombosis in right lower extremity - Patient has been on Coumadin as above, now held in light of potential surgical intervention, with heparin infusion to be initiated per pharmacy once INR is below 2 - Mild edema of the right lower extremity on exam, but is symmetric with left, not erythematous or particularly tender, and with no palpable cord  4. Chronic pain with opiate dependence - Patient will be continued on MS Contin with immediate release oxycodone available as needed for breakthrough pain  DVT ppx: Presented on Coumadin, now held with INR pending  Code Status: Full code Family Communication: None at bed side.     Disposition Plan: Admit to inpatient   Date of Service 07/05/2015    Briscoe Deutscher [facility] Pager [phone]  If 7PM-7AM, please contact night-coverage [email or web address] Password [email or web address] 07/05/2015, 12:47 AM

## 2015-07-05 NOTE — Progress Notes (Signed)
Paged on-call MD Claiborne Billingsallahan about pt's BP 102/66 and HR 71 and his scheduled 10 PM medications including 75 mg of metoprolol, 90 mg of morphine sulfate extended release, and 30 mg of restoril. MD returned page; gave orders to hold the scheduled 75 mg metoprolol and administer his other scheduled meds. Will carry out orders at this time.

## 2015-07-05 NOTE — Progress Notes (Addendum)
Pt Heparin noted not running  upon pt arrival from MRI. Heparin restarted and flushed. Dionne BucyP. Amo Janaysia Mcleroy RN

## 2015-07-05 NOTE — Progress Notes (Signed)
[facility]. Admissions returned page stating that MD was paged. Will continue to monitor.

## 2015-07-05 NOTE — Progress Notes (Signed)
Paged on-call MD Claiborne Billingsallahan for pain medicine because pt was c/o of 7/10 pain in his left wrist and hand and his right foot.  New orders received and implemented. Will continue to monitor.

## 2015-07-05 NOTE — Progress Notes (Signed)
ANTICOAGULATION CONSULT NOTE - Initial Consult  Pharmacy Consult for Heparin (holding warfarin) Indication: DVT  Allergies  Allergen Reactions  . Imitrex [Sumatriptan] Other (See Comments)    Chest pain, 2004 (tolerates 50 mg prn)  . Verapamil Other (See Comments)    Causes pvc's   Patient Measurements: Height: 6\' 3"  (190.5 cm) Weight: 219 lb 12.8 oz (99.701 kg) IBW/kg (Calculated) : 84.5  Vital Signs: Temp: 97.9 F (36.6 C) (12/08 2050) Temp Source: Oral (12/08 2050) BP: 133/79 mmHg (12/09 0119) Pulse Rate: 87 (12/09 0119)  Labs:  Recent Labs  07/05/15 0140  HGB 11.3*  HCT 37.5*  PLT 318  APTT 39*  LABPROT 19.0*  INR 1.59*    CrCl cannot be calculated (Patient has no serum creatinine result on file.).   Medical History: Past Medical History  Diagnosis Date  . Hypertension   . Chronic knee pain   . Hyperlipemia   . PUD (peptic ulcer disease)   . Dysrhythmia   . Peripheral vascular disease (HCC)   . Anxiety   . GERD (gastroesophageal reflux disease)   . 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 Wound of right ankle 06/05/2015    Assessment: Recent discharged for staph aureus bacteremia with ruptured mycotic femoral artery aneurysm, on warfarin PTA for DVT, holding warfarin and starting heparin in anticipation of surgery, INR is 1.59 so will start heparin now, Hgb 11.3, other labs reviewed.   Goal of Therapy:  Heparin level 0.3-0.7 units/ml Monitor platelets by anticoagulation protocol: Yes   Plan:  -Start heparin drip at 1400 units/hr -1100 HL -Daily CBC/HL -Monitor for bleeding -Likely surgery today  Abran DukeLedford, Rolland Steinert 07/05/2015,2:25 AM

## 2015-07-05 NOTE — Progress Notes (Signed)
ANTICOAGULATION CONSULT NOTE - Initial Consult  Pharmacy Consult for Heparin (holding warfarin) Indication: DVT  Allergies  Allergen Reactions  . Imitrex [Sumatriptan] Other (See Comments)    Chest pain, 2004 (tolerates 50 mg prn)  . Verapamil Other (See Comments)    Causes pvc's   Patient Measurements: Height: 6\' 3"  (190.5 cm) Weight: 219 lb 12.8 oz (99.701 kg) IBW/kg (Calculated) : 84.5  Vital Signs: Temp: 98 F (36.7 C) (12/09 1427) Temp Source: Oral (12/09 1427) BP: 112/73 mmHg (12/09 1427) Pulse Rate: 71 (12/09 1427)  Labs:  Recent Labs  07/05/15 0140 07/05/15 1023 07/05/15 1817  HGB 11.3*  --   --   HCT 37.5*  --   --   PLT 318  --   --   APTT 39*  --   --   LABPROT 19.0*  --   --   INR 1.59*  --   --   HEPARINUNFRC  --  <0.10* <0.10*  CREATININE 0.77  --   --   TROPONINI <0.03  --   --     Estimated Creatinine Clearance: 121.8 mL/min (by C-G formula based on Cr of 0.77).   Medical History: Past Medical History  Diagnosis Date  . Hypertension   . Chronic knee pain   . Hyperlipemia   . PUD (peptic ulcer disease)   . Dysrhythmia   . Peripheral vascular disease (HCC)   . Anxiety   . GERD (gastroesophageal reflux disease)   . 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 Wound of right ankle 06/05/2015    Assessment: Recent discharged for staph aureus bacteremia with ruptured mycotic femoral artery aneurysm, on warfarin PTA for DVT, holding warfarin and starting heparin in anticipation of surgery, INR is 1.59 so will start heparin now, Hgb 11.3, other labs reviewed. Heparin level is undetectable tonight. We'll increase the rate for now. It looks like the procedure prob won't happen.   Goal of Therapy:  Heparin level 0.3-0.7 units/ml Monitor platelets by anticoagulation protocol: Yes   Plan:   Heparin bolus 2000 units x1   Increase heparin to 1700 units/hr F/u with AM HL/CBC  Ulyses SouthwardMinh Amariyon Maynes,  PharmD Pager: [phone] 07/05/2015 7:24 PM

## 2015-07-05 NOTE — Progress Notes (Signed)
[facility] PROGRESS NOTE  [name]:[id number] DOB: 09-28-1955 DOA: 07/04/2015 PCP: [name], MD  Assessment/Plan: 1. Osteomyelitis of Wrist -[name] is a 59 year old gentleman having history of left wrist abscess status post incision and drainage, found to have evidence of osteomyelitis/septic joint involving left wrist from MRI dated 05/11/2015. -He has been treated with IN ancef in the outpatient setting.  -There was concern from his infectious disease specialist regarding progression of infection for which admission was recommended for hand surgery consultation. MRI of left wrist performed on 07/05/2015 revealing findings concerning again for septic arthritis and osteomyelitis involving carpal bones and bases of the metacarpals. -Today I discussed case with his hand surgeon [name] informing him of last night's MRI findings who recommended further treatment with IV antibiotic therapy and follow up at his office this Monday.  -Case discussed with Dr Ninetta Lights of Infectious Disease who recommended obtaining a second opinion from Hand Surgery. Per Dr Algis Liming a second opinion was sought from Dr Amanda Pea who declined to see patient since he was already being seen by Dr [name].  -Patient appears nontoxic, hemodynamically stable.   2. 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 of orthopedic surgery on 05/17/2015 -Surgical wound appears to be healing well, no evidence of active infection.  -Wound consult   3.  Right femoral abscess/right femoral aneurysm -Status post excision of ruptured right mycotic SFA, with right thigh incision and drainage of abscess -Surgical incision site appears clean, no evidence of active infection  4.  History of DVT of right lower extremity -Coumadin was held given the possibility of surgery. -INR now subtherapeutic, pharmacy consulted for IV heparin dosing.   Code  Status: Full Code Family Communication: Spoke to his wife and daughter were present at bedside Disposition Plan: Starting Ancef, in process of obtaining second opinion orthopedic consult   Consultants:  Infectious disease  Antibiotics:  Ancef  HPI/Subjective: [name] is a pleasant 59 year old gentleman with a past medical history of hypertension, dyslipidemia, peripheral last her disease, having a history of MSSA bacteremia, right femoral abscess, right femoral aneurysm (S/p excision of ruptured R mycotic SFA) , left hand abscess (S/p I&D)  for which she was hospitalized in September 2016. He was eventually discharged on Ancef  for a total of 6 weeks. Patient was transferred to acute rehabilitation however came back to the hospital about a week later with fever. For the workup of left hand with MRI revealed findings concerning for osteomyelitis. Patient was also found to have gangrene of right great toe and second toe, undergoing first and second ray amputation, procedure performed by [name] of Orthopedic Surgery. Craig. [name] was discharged on an additional 8 weeks of IV Ancef.  Concern for persistent left wrist pain associate with swelling consistent with further progression of infection. MRI of left hand performed on 07/05/2015 showing findings concerning for septic arthritis and osteomyelitis   Objective: Filed Vitals:   07/05/15 0921 07/05/15 1427  BP: 120/74 112/73  Pulse: 73 71  Temp: 98.1 F (36.7 C) 98 F (36.7 C)  Resp: 18 18    Intake/Output Summary (Last 24 hours) at 07/05/15 1534 Last data filed at 07/05/15 1300  Gross per 24 hour  Intake 418.33 ml  Output    650 ml  Net -231.67 ml   Filed Weights   07/04/15 2035  Weight: 99.701 kg (219 lb 12.8 oz)  Exam:   General:  Patient is awake and alert, nontoxic-appearing  Cardiovascular: Regular rate and rhythm normal S1-S2  Respiratory: Normal respiratory effort, lungs are clear to auscultation  bilaterally  Abdomen: Soft nontender nondistended positive bowel sounds  Musculoskeletal: There is swelling surrounding left wrist, having some associated pain with passive and active movement There was mild erythema. I did not palpate fluctuant masses or notice purulence. On his right foot status post first and second ray amputations, no evidence of active infection.  Data Reviewed: Basic Metabolic Panel:  Recent Labs Lab 07/05/15 0140  NA 135  K 3.9  CL 104  CO2 22  GLUCOSE 110*  BUN 14  CREATININE 0.77  CALCIUM 9.1  MG 1.9   Liver Function Tests:  Recent Labs Lab 07/05/15 0140  AST 20  ALT 10*  ALKPHOS 96  BILITOT 0.3  PROT 7.9  ALBUMIN 3.6   No results for input(s): LIPASE, AMYLASE in the last 168 hours. No results for input(s): AMMONIA in the last 168 hours. CBC:  Recent Labs Lab 07/05/15 0140  WBC 11.0*  NEUTROABS 6.1  HGB 11.3*  HCT 37.5*  MCV 85.2  PLT 318   Cardiac Enzymes:  Recent Labs Lab 07/05/15 0140  TROPONINI <0.03   BNP (last 3 results)  Recent Labs  07/05/15 0140  BNP 20.4    ProBNP (last 3 results) No results for input(s): PROBNP in the last 8760 hours.  CBG: No results for input(s): GLUCAP in the last 168 hours.  No results found for this or any previous visit (from the past 240 hour(s)).   Studies: Dg Chest 1 View  07/05/2015  CLINICAL DATA:  Preop. EXAM: CHEST 1 VIEW COMPARISON:  05/08/2015 FINDINGS: There is a right upper extremity PICC with tip at the upper cavoatrial junction. Normal heart size and mediastinal contours. There is no edema, consolidation, effusion, or pneumothorax. IMPRESSION: No active disease. Electronically Signed   By: Marnee Spring M.D.   On: 07/05/2015 08:48   Craig Hand Left W Wo Contrast  07/05/2015  CLINICAL DATA:  Pain and swelling of the left wrist and hand. History of left radius fracture. EXAM: Craig OF THE LEFT WRIST WITHOUT AND WITH CONTRAST TECHNIQUE: Multiplanar multisequence Craig imaging of  the left wrist was performed both before and after the administration of intravenous contrast. CONTRAST:  20mL MULTIHANCE GADOBENATE DIMEGLUMINE 529 MG/ML IV SOLN COMPARISON:  05/11/2015 FINDINGS: There is severe patient motion degrading image quality limiting evaluation. Ligaments: Nonvisualized lunotriquetral ligament. Nonvisualized scapholunate ligament. Triangular fibrocartilage: Limited visualization secondary to patient motion. Tendons: Intact flexor and extensor compartment tendons. Fluid in the extensor carpi radialis longus and brevis tendon sheath with synovial enhancement most consistent with tenosynovitis. Carpal tunnel/median nerve: Normal carpal tunnel. Normal median nerve. Guyon's canal: Normal. Joint/cartilage: Suboptimal visualization secondary to severe patient motion. There is apparent diffuse chondral loss and joint space narrowing. Bones/carpal alignment: Diffuse marrow edema in the distal radius, distal ulna, throughout the carpal bones and at the bases of the metacarpals most severe in the second metacarpal. Other: Diffuse soft tissue swelling and marked synovial thickening with enhancement. 1.9 x 0.8 x 1.2 cm peripherally enhancing fluid collection which appears to emanate from the dorsal distal radioulnar joint. IMPRESSION: 1. Severely limited examination secondary to significant patient motion throughout the exam degrading image quality. 2. Severe edema involving the distal radius, distal ulna, throughout the carpal bones and bases of the metacarpals with severe synovial enhancement and extensive joint space narrowing most concerning for septic  arthritis and osteomyelitis versus alternatively aggressive inflammatory or crystalline arthropathy. 1.9 x 0.8 x 1.2 cm peripherally enhancing fluid collection which appears to emanate from the dorsal distal radioulnar joint. 3. Tenosynovitis of the extensor carpi radialis longus and brevis which may be inflammatory versus infectious. Electronically  Signed   By: Elige KoHetal  Patel   On: 07/05/2015 08:24   Craig Wrist Left W Wo Contrast  07/05/2015  CLINICAL DATA:  Pain and swelling of the left wrist and hand. History of left radius fracture. EXAM: Craig OF THE LEFT WRIST WITHOUT AND WITH CONTRAST TECHNIQUE: Multiplanar multisequence Craig imaging of the left wrist was performed both before and after the administration of intravenous contrast. CONTRAST:  20mL MULTIHANCE GADOBENATE DIMEGLUMINE 529 MG/ML IV SOLN COMPARISON:  05/11/2015 FINDINGS: There is severe patient motion degrading image quality limiting evaluation. Ligaments: Nonvisualized lunotriquetral ligament. Nonvisualized scapholunate ligament. Triangular fibrocartilage: Limited visualization secondary to patient motion. Tendons: Intact flexor and extensor compartment tendons. Fluid in the extensor carpi radialis longus and brevis tendon sheath with synovial enhancement most consistent with tenosynovitis. Carpal tunnel/median nerve: Normal carpal tunnel. Normal median nerve. Guyon's canal: Normal. Joint/cartilage: Suboptimal visualization secondary to severe patient motion. There is apparent diffuse chondral loss and joint space narrowing. Bones/carpal alignment: Diffuse marrow edema in the distal radius, distal ulna, throughout the carpal bones and at the bases of the metacarpals most severe in the second metacarpal. Other: Diffuse soft tissue swelling and marked synovial thickening with enhancement. 1.9 x 0.8 x 1.2 cm peripherally enhancing fluid collection which appears to emanate from the dorsal distal radioulnar joint. IMPRESSION: 1. Severely limited examination secondary to significant patient motion throughout the exam degrading image quality. 2. Severe edema involving the distal radius, distal ulna, throughout the carpal bones and bases of the metacarpals with severe synovial enhancement and extensive joint space narrowing most concerning for septic arthritis and osteomyelitis versus alternatively  aggressive inflammatory or crystalline arthropathy. 1.9 x 0.8 x 1.2 cm peripherally enhancing fluid collection which appears to emanate from the dorsal distal radioulnar joint. 3. Tenosynovitis of the extensor carpi radialis longus and brevis which may be inflammatory versus infectious. Electronically Signed   By: Elige KoHetal  Patel   On: 07/05/2015 08:24    Scheduled Meds: . atorvastatin  80 mg Oral Daily  .  ceFAZolin (ANCEF) IV  2 g Intravenous 3 times per day  . docusate sodium  100 mg Oral BID  . DULoxetine  30 mg Oral Daily  . flecainide  100 mg Oral Q12H  . gabapentin  100 mg Oral TID  . hydrocerin   Topical Daily  . metoprolol tartrate  75 mg Oral BID  . morphine  90 mg Oral Q12H  . multivitamin with minerals  1 tablet Oral Daily  . naloxegol oxalate  25 mg Oral QAC breakfast  . polyethylene glycol  17 g Oral Daily  . sodium chloride  3 mL Intravenous Q12H  . temazepam  15-30 mg Oral QHS  . venlafaxine XR  75 mg Oral Q breakfast   Continuous Infusions: . heparin 1,400 Units/hr (07/05/15 0939)    Principal Problem:   Acute osteomyelitis of left ulna (HCC) Active Problems:   Chronic pain syndrome   Cellulitis of left hand   Arm pain, anterior   Cellulitis of finger of left hand   Cellulitis of left upper extremity   Abscess of left hand   Osteomyelitis (HCC)   Paroxysmal atrial fibrillation (HCC)    Time spent: 35 min    Gracelee Stemmler,  [facility]  [facility] Pager [phone]. If 7PM-7AM, please contact night-coverage at [email or web address], password [facility] 07/05/2015, 3:34 PM  LOS: 1 day

## 2015-07-05 NOTE — Consult Note (Addendum)
[facility]  Date of Admission:  07/04/2015  Date of Consult:  07/05/2015  Reason for Consult: Osteomyelitis of L wrist Referring Physician: Vanessa [name]  Impression/Recommendation Osteomyelitis of L wrist Prev mssa sepsis R foot wounds  Will continue him on ancef I spoke with hand surgery for possible second opinion.  Hospitalist has called his primary surgeon, they wish to see him as outpt on 12-12 Will recheck his ESR and CRP while in hospital.  States he had ortho f/u sched for today, can reschedule this.   Thank you so much for this interesting consult,   [name] (pager) [phone] [email or web address]  [name] is an 59 y.o. male.  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 [name], debrided on 9-27. He had BCx+ for staph aureus (MSSA) at that time as well. He had TTE that did not comment on veg, showed grade 1 diastolic dysfxn, he was recommended to have TEE (not done) as well as to continue on ancef for 6 weeks. He underwent plastics flap on 9-23 to his thigh. He was transferred to rehab on 10-3. Returned to hospital on 10-12 with SVT and temp 100.7. He had repeat MRI of his hand which was concerning for osteo at this time as well as drainage from his R foot and aneurysm repair site.   He was seen by ID 10-13 and 10-18 and recommended to receive an additional 8 weeks of ancef. He underwent amputation of 2 toes 10-21. He was d/c to home on 10-29 with ancef 2g q8h.  He had ID f/u on 11-9 and was sent for MRI of his L wrist which unfortunately was delayed.  He had ID f/u on 12-8 and was noted to have worsened swelling, numbness, and pain. He had MRI 12-9: 1. Severely limited examination secondary to significant patient motion throughout the exam degrading image quality. 2. Severe edema involving the distal radius, distal ulna, throughout the  carpal bones and bases of the metacarpals with severe synovial enhancement and extensive joint space narrowing most concerning for septic arthritis and osteomyelitis versus alternatively aggressive inflammatory or crystalline arthropathy. 1.9 x 0.8 x 1.2 cm peripherally enhancing fluid collection which appears to emanate from the dorsal distal radioulnar joint. 3. Tenosynovitis of the extensor carpi radialis longus and brevis which may be inflammatory versus infectious.   Past Medical History  Diagnosis Date  . Hypertension   . Chronic knee pain   . Hyperlipemia   . PUD (peptic ulcer disease)   . Dysrhythmia   . Peripheral vascular disease (HCC)   . Anxiety   . GERD (gastroesophageal reflux disease)   . 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 Wound of right ankle 06/05/2015    Past Surgical History  Procedure Laterality Date  . I&d extremity Left 07/12/2013    Procedure: I&D Left Thigh Abscess;  Surgeon: Craig Arthurs, MD;  Location: [facility];  Service: Orthopedics;  Laterality: Left;  . Replacement total knee bilateral    . Tonsillectomy    . Hernia repair    . Cholecystectomy    . Appendectomy    . Shoulder surgery    . Elbow surgery    . Femoral artery exploration Right 04/11/2015    Procedure: Resection of infected right femoral artery aneurysm;  Surgeon: Craig Hertz, MD;  Location: [facility] OR;  Service: Vascular;  Laterality: Right;  . Incision and drainage abscess Right 04/11/2015    Procedure: INCISION AND DRAINAGE OF RIGHT THIGH ABSCESS;  Surgeon: Craig Sabula, MD;  Location: [place];  Service: Vascular;  Laterality: Right;  . Incision and drainage abscess Right 04/13/2015    Procedure: INCISION AND DRAINAGE THIGH ABSCESS;  Surgeon: Craig Beyerville, MD;  Location: [place];  Service: Vascular;  Laterality: Right;  . I&d extremity Left 04/17/2015    Procedure: IRRIGATION AND DEBRIDEMENT LEFT EXTREMITY;  Surgeon: Craig Henry Fork, MD;   Location: [place];  Service: Vascular;  Laterality: Left;  Marland Kitchen Muscle flap closure Right 04/19/2015    Procedure: RIGHT RECTUS ABDOMINUS  FLAP TO RIGHT GROIN;  Surgeon: Craig Limbo, MD;  Location: [place];  Service: Plastics;  Laterality: Right;  . Femoral revision Right 04/19/2015    Procedure: OVERSEW RIGHT FEMORAL VEIN ;  Surgeon: Craig Mould, MD;  Location: [place];  Service: Vascular;  Laterality: Right;  . Wound exploration Right 04/23/2015    Procedure: EXAM UNDER ANESTHESIA AND VAC CHANGE RIGHT THIGH;  Surgeon: Craig Limbo, MD;  Location: [place];  Service: Plastics;  Laterality: Right;  . Amputation Right 05/17/2015    Procedure: Right 1st and 2nd Toe Amputation;  Surgeon: Craig Minion, MD;  Location: [place];  Service: Orthopedics;  Laterality: Right;     Allergies  Allergen Reactions  . Imitrex [Sumatriptan] Other (See Comments)    Chest pain, 2004 (tolerates 50 mg prn)  . Verapamil Other (See Comments)    Causes pvc's    Medications:  Scheduled: . atorvastatin  80 mg Oral Daily  . docusate sodium  100 mg Oral BID  . DULoxetine  30 mg Oral Daily  . flecainide  100 mg Oral Q12H  . gabapentin  100 mg Oral TID  . metoprolol tartrate  75 mg Oral BID  . morphine  90 mg Oral Q12H  . multivitamin with minerals  1 tablet Oral Daily  . naloxegol oxalate  25 mg Oral QAC breakfast  . polyethylene glycol  17 g Oral Daily  . sodium chloride  3 mL Intravenous Q12H  . temazepam  15-30 mg Oral QHS  . venlafaxine XR  75 mg Oral Q breakfast    Abtx:  Anti-infectives    Start     Dose/Rate Route Frequency Ordered Stop   07/05/15 1000  valACYclovir (VALTREX) tablet 1,000 mg  Status:  Discontinued     1,000 mg Oral Daily 07/05/15 0013 07/05/15 0017      Total days of antibiotics: 102 (sept 9, 2016)          Social History:  reports that he has quit smoking. His smoking use included Cigarettes. He has never used smokeless tobacco. He reports that he does not drink alcohol or  use illicit drugs.  Family History  Problem Relation Age of Onset  . Heart attack Paternal Grandmother 53  . Heart disease Mother 34    arrythmia  . Stroke Paternal Grandfather   . Stroke Maternal Grandfather     General ROS: no f/c, no problems with PIC, is unable to use his L wrist, normal BM, normal urine, see HPI. 12 point ROS o/w normal.   Blood pressure 120/74, pulse 73, temperature 98.1 F (36.7 C), temperature source Oral, resp. rate 18, height $RemoveBe'6\' 3"'sqrajTFxq$  (1.905 m), weight 99.701 kg (219 lb 12.8 oz), SpO2 100 %. General appearance: alert, cooperative and no distress Eyes: negative findings: conjunctivae  and sclerae normal and pupils equal, round, reactive to light and accomodation Throat: lips, mucosa, and tongue normal; teeth and gums normal Neck: no adenopathy and supple, symmetrical, trachea midline Lungs: clear to auscultation bilaterally Heart: regular rate and rhythm Abdomen: normal findings: bowel sounds normal and soft, non-tender Extremities: edema none and L wrist swollen, warm, non-tender. RUE PIC is clean, no erythema, no streaking, no d/c.  R foot- some scabbing, no d/c. Well healing.   Results for orders placed or performed during the hospital encounter of 07/04/15 (from the past 48 hour(s))  Comprehensive metabolic panel     Status: Abnormal   Collection Time: 07/05/15  1:40 AM  Result Value Ref Range   Sodium 135 135 - 145 mmol/L   Potassium 3.9 3.5 - 5.1 mmol/L   Chloride 104 101 - 111 mmol/L   CO2 22 22 - 32 mmol/L   Glucose, Bld 110 (H) 65 - 99 mg/dL   BUN 14 6 - 20 mg/dL   Creatinine, Ser 0.77 0.61 - 1.24 mg/dL   Calcium 9.1 8.9 - 10.3 mg/dL   Total Protein 7.9 6.5 - 8.1 g/dL   Albumin 3.6 3.5 - 5.0 g/dL   AST 20 15 - 41 U/L   ALT 10 (L) 17 - 63 U/L   Alkaline Phosphatase 96 38 - 126 U/L   Total Bilirubin 0.3 0.3 - 1.2 mg/dL   GFR calc non Af Amer >60 >60 mL/min   GFR calc Af Amer >60 >60 mL/min    Comment: (NOTE) The eGFR has been calculated using  the CKD EPI equation. This calculation has not been validated in all clinical situations. eGFR's persistently <60 mL/min signify possible Chronic Kidney Disease.    Anion gap 9 5 - 15  Magnesium     Status: None   Collection Time: 07/05/15  1:40 AM  Result Value Ref Range   Magnesium 1.9 1.7 - 2.4 mg/dL  CBC WITH DIFFERENTIAL     Status: Abnormal   Collection Time: 07/05/15  1:40 AM  Result Value Ref Range   WBC 11.0 (H) 4.0 - 10.5 K/uL   RBC 4.40 4.22 - 5.81 MIL/uL   Hemoglobin 11.3 (L) 13.0 - 17.0 g/dL   HCT 37.5 (L) 39.0 - 52.0 %   MCV 85.2 78.0 - 100.0 fL   MCH 25.7 (L) 26.0 - 34.0 pg   MCHC 30.1 30.0 - 36.0 g/dL   RDW 17.3 (H) 11.5 - 15.5 %   Platelets 318 150 - 400 K/uL   Neutrophils Relative % 56 %   Neutro Abs 6.1 1.7 - 7.7 K/uL   Lymphocytes Relative 31 %   Lymphs Abs 3.5 0.7 - 4.0 K/uL   Monocytes Relative 11 %   Monocytes Absolute 1.2 (H) 0.1 - 1.0 K/uL   Eosinophils Relative 2 %   Eosinophils Absolute 0.2 0.0 - 0.7 K/uL   Basophils Relative 0 %   Basophils Absolute 0.0 0.0 - 0.1 K/uL  Protime-INR     Status: Abnormal   Collection Time: 07/05/15  1:40 AM  Result Value Ref Range   Prothrombin Time 19.0 (H) 11.6 - 15.2 seconds   INR 1.59 (H) 0.00 - 1.49  APTT     Status: Abnormal   Collection Time: 07/05/15  1:40 AM  Result Value Ref Range   aPTT 39 (H) 24 - 37 seconds    Comment:        IF BASELINE aPTT IS ELEVATED, SUGGEST PATIENT RISK ASSESSMENT BE USED TO DETERMINE  APPROPRIATE ANTICOAGULANT THERAPY.   Brain natriuretic peptide     Status: None   Collection Time: 07/05/15  1:40 AM  Result Value Ref Range   B Natriuretic Peptide 20.4 0.0 - 100.0 pg/mL  Troponin I     Status: None   Collection Time: 07/05/15  1:40 AM  Result Value Ref Range   Troponin I <0.03 <0.031 ng/mL    Comment:        NO INDICATION OF MYOCARDIAL INJURY.   TSH     Status: None   Collection Time: 07/05/15  1:40 AM  Result Value Ref Range   TSH 3.670 0.350 - 4.500 uIU/mL    Lactic acid, plasma     Status: None   Collection Time: 07/05/15  1:40 AM  Result Value Ref Range   Lactic Acid, Venous 1.2 0.5 - 2.0 mmol/L  Lactic acid, plasma     Status: None   Collection Time: 07/05/15  4:48 AM  Result Value Ref Range   Lactic Acid, Venous 1.1 0.5 - 2.0 mmol/L  Heparin level (unfractionated)     Status: Abnormal   Collection Time: 07/05/15 10:23 AM  Result Value Ref Range   Heparin Unfractionated <0.10 (L) 0.30 - 0.70 IU/mL    Comment:        IF HEPARIN RESULTS ARE BELOW EXPECTED VALUES, AND PATIENT DOSAGE HAS BEEN CONFIRMED, SUGGEST FOLLOW UP TESTING OF ANTITHROMBIN III LEVELS. REPEATED TO VERIFY       Component Value Date/Time   SDES URINE, RANDOM 05/08/2015 1942   SPECREQUEST NONE 05/08/2015 1942   CULT NO GROWTH 1 DAY 05/08/2015 1942   REPTSTATUS 05/09/2015 FINAL 05/08/2015 1942   Dg Chest 1 View  07/05/2015  CLINICAL DATA:  Preop. EXAM: CHEST 1 VIEW COMPARISON:  05/08/2015 FINDINGS: There is a right upper extremity PICC with tip at the upper cavoatrial junction. Normal heart size and mediastinal contours. There is no edema, consolidation, effusion, or pneumothorax. IMPRESSION: No active disease. Electronically Signed   By: Monte Fantasia M.D.   On: 07/05/2015 08:48   Mr Hand Left W Wo Contrast  07/05/2015  CLINICAL DATA:  Pain and swelling of the left wrist and hand. History of left radius fracture. EXAM: MR OF THE LEFT WRIST WITHOUT AND WITH CONTRAST TECHNIQUE: Multiplanar multisequence MR imaging of the left wrist was performed both before and after the administration of intravenous contrast. CONTRAST:  54mL MULTIHANCE GADOBENATE DIMEGLUMINE 529 MG/ML IV SOLN COMPARISON:  05/11/2015 FINDINGS: There is severe patient motion degrading image quality limiting evaluation. Ligaments: Nonvisualized lunotriquetral ligament. Nonvisualized scapholunate ligament. Triangular fibrocartilage: Limited visualization secondary to patient motion. Tendons: Intact flexor  and extensor compartment tendons. Fluid in the extensor carpi radialis longus and brevis tendon sheath with synovial enhancement most consistent with tenosynovitis. Carpal tunnel/median nerve: Normal carpal tunnel. Normal median nerve. Guyon's canal: Normal. Joint/cartilage: Suboptimal visualization secondary to severe patient motion. There is apparent diffuse chondral loss and joint space narrowing. Bones/carpal alignment: Diffuse marrow edema in the distal radius, distal ulna, throughout the carpal bones and at the bases of the metacarpals most severe in the second metacarpal. Other: Diffuse soft tissue swelling and marked synovial thickening with enhancement. 1.9 x 0.8 x 1.2 cm peripherally enhancing fluid collection which appears to emanate from the dorsal distal radioulnar joint. IMPRESSION: 1. Severely limited examination secondary to significant patient motion throughout the exam degrading image quality. 2. Severe edema involving the distal radius, distal ulna, throughout the carpal bones and bases of the metacarpals with severe  synovial enhancement and extensive joint space narrowing most concerning for septic arthritis and osteomyelitis versus alternatively aggressive inflammatory or crystalline arthropathy. 1.9 x 0.8 x 1.2 cm peripherally enhancing fluid collection which appears to emanate from the dorsal distal radioulnar joint. 3. Tenosynovitis of the extensor carpi radialis longus and brevis which may be inflammatory versus infectious. Electronically Signed   By: Kathreen Devoid   On: 07/05/2015 08:24   Mr Wrist Left W Wo Contrast  07/05/2015  CLINICAL DATA:  Pain and swelling of the left wrist and hand. History of left radius fracture. EXAM: MR OF THE LEFT WRIST WITHOUT AND WITH CONTRAST TECHNIQUE: Multiplanar multisequence MR imaging of the left wrist was performed both before and after the administration of intravenous contrast. CONTRAST:  60mL MULTIHANCE GADOBENATE DIMEGLUMINE 529 MG/ML IV SOLN  COMPARISON:  05/11/2015 FINDINGS: There is severe patient motion degrading image quality limiting evaluation. Ligaments: Nonvisualized lunotriquetral ligament. Nonvisualized scapholunate ligament. Triangular fibrocartilage: Limited visualization secondary to patient motion. Tendons: Intact flexor and extensor compartment tendons. Fluid in the extensor carpi radialis longus and brevis tendon sheath with synovial enhancement most consistent with tenosynovitis. Carpal tunnel/median nerve: Normal carpal tunnel. Normal median nerve. Guyon's canal: Normal. Joint/cartilage: Suboptimal visualization secondary to severe patient motion. There is apparent diffuse chondral loss and joint space narrowing. Bones/carpal alignment: Diffuse marrow edema in the distal radius, distal ulna, throughout the carpal bones and at the bases of the metacarpals most severe in the second metacarpal. Other: Diffuse soft tissue swelling and marked synovial thickening with enhancement. 1.9 x 0.8 x 1.2 cm peripherally enhancing fluid collection which appears to emanate from the dorsal distal radioulnar joint. IMPRESSION: 1. Severely limited examination secondary to significant patient motion throughout the exam degrading image quality. 2. Severe edema involving the distal radius, distal ulna, throughout the carpal bones and bases of the metacarpals with severe synovial enhancement and extensive joint space narrowing most concerning for septic arthritis and osteomyelitis versus alternatively aggressive inflammatory or crystalline arthropathy. 1.9 x 0.8 x 1.2 cm peripherally enhancing fluid collection which appears to emanate from the dorsal distal radioulnar joint. 3. Tenosynovitis of the extensor carpi radialis longus and brevis which may be inflammatory versus infectious. Electronically Signed   By: Kathreen Devoid   On: 07/05/2015 08:24   No results found for this or any previous visit (from the past 240 hour(s)).    07/05/2015, 11:16 AM      LOS: 1 day    Records and images were personally reviewed where available.

## 2015-07-05 NOTE — Progress Notes (Signed)
MD arrived to unit to assess patient. Awaiting new orders to be entered. Will continue to monitor.

## 2015-07-05 NOTE — Consult Note (Signed)
WOC wound consult note Reason for Consult: right groin, right foot Patient with extensive surgical history of his right LE/groin in September 2016.  Amputation of the right first and second toes as well.  Plastic surgery involved for muscle flap closure of the groin/thigh.  Wound type: Surgical wound right thigh: 3cm x 1.5cm x 0.2cm; 100% granulation tissue, pink, moist Surgical wound right foot: 10cm; 4 areas of eschar, stable, dry Pressure Ulcer POA: No Drainage (amount, consistency, odor) none from the foot, scant from the right thigh Periwound: intact with scarring, the foot is extremely dry.  Dressing procedure/placement/frequency: Eucerin for dry skin of the foot, no topical care needed for the foot.  Patient using collagen dressing every 3 days per plastic surgeon guidance.  Family will provide dressing from home, hospital does not carry collagen.  Family in agreement for this.  Orders written.  Discussed POC with patient and bedside nurse.  Re consult if needed, will not follow at this time. Thanks  Dyana Magner [employer] RN, CWOCN [phone])

## 2015-07-05 NOTE — Care Management Note (Signed)
Case Management Note  Patient Details  Name: [name] MRN: [id number] Date of Birth: [birth year]  Subjective/Objective:   Patient lives with spouse, pta indep.  NCM will cont to follow for dc needs.                 Action/Plan:   Expected Discharge Date:                  Expected Discharge Plan:  Home/Self Care  In-House Referral:     Discharge planning Services  CM Consult  Post Acute Care Choice:    Choice offered to:     DME Arranged:    DME Agency:     HH Arranged:    HH Agency:     Status of Service:  In process, will continue to follow  Medicare Important Message Given:    Date Medicare IM Given:    Medicare IM give by:    Date Additional Medicare IM Given:    Additional Medicare Important Message give by:     If discussed at Long Length of Stay Meetings, dates discussed:    Additional Comments:  Leone Havenaylor, Avamae Dehaan Clinton, RN 07/05/2015, 6:39 PM

## 2015-07-05 NOTE — Progress Notes (Signed)
NURSING PROGRESS NOTE  Craig LemonsJimmy F FoxMRN: [id number] Admission Data: 07/04/15   7:40 pm Attending Provider: Briscoe Deutscherimothy S Opyd, MD PCP: Patria ManeGASSEMI, MIKE, MD Code status: Full  Allergies:  Allergies  Allergen Reactions  . Imitrex [Sumatriptan] Other (See Comments)    Chest pain, 2004 (tolerates 50 mg prn)  . Verapamil Other (See Comments)    Causes pvc's    Past Medical History:  Past Medical History  Diagnosis Date  . Hypertension   . Chronic knee pain   . Hyperlipemia   . PUD (peptic ulcer disease)   . Dysrhythmia   . Peripheral vascular disease (HCC)   . Anxiety   . GERD (gastroesophageal reflux disease)   . 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 Wound of right ankle 06/05/2015    Past Surgical History:  Past Surgical History  Procedure Laterality Date  . I&d extremity Left 07/12/2013    Procedure: I&D Left Thigh Abscess;  Surgeon: Toni ArthursJohn [name], MD;  [facility];  Service: Orthopedics;  Laterality: Left;  . Replacement total knee bilateral    . Tonsillectomy    . Hernia repair    . Cholecystectomy    . Appendectomy    . Shoulder surgery    . Elbow surgery    . Femoral artery exploration Right 04/11/2015    Procedure: Resection of infected right femoral artery aneurysm;  Surgeon: Fransisco HertzBrian L Chen, MD;  [facility];  Service: Vascular;  Laterality: Right;  . Incision and drainage abscess Right 04/11/2015    Procedure: INCISION AND DRAINAGE OF RIGHT THIGH ABSCESS;  Surgeon: Fransisco HertzBrian L Chen, MD;  [facility];  Service: Vascular;  Laterality: Right;  . Incision and drainage abscess Right 04/13/2015    Procedure: INCISION AND DRAINAGE THIGH ABSCESS;  Surgeon: Fransisco HertzBrian L Chen, MD;  [facility];  Service: Vascular;  Laterality: Right;  . I&d extremity Left 04/17/2015    Procedure: IRRIGATION AND DEBRIDEMENT LEFT EXTREMITY;  Surgeon: Fransisco HertzBrian L Chen, MD;  [facility];  Service: Vascular;  Laterality: Left;  Marland Kitchen. Muscle flap  closure Right 04/19/2015    Procedure: RIGHT RECTUS ABDOMINUS  FLAP TO RIGHT GROIN;  Surgeon: Glenna FellowsBrinda Thimmappa, MD;  Location[facility] OR;  Service: Plastics;  Laterality: Right;  . Femoral revision Right 04/19/2015    Procedure: OVERSEW RIGHT FEMORAL VEIN ;  Surgeon: Chuck Hinthristopher S Dickson, MD;  [facility];  Service: Vascular;  Laterality: Right;  . Wound exploration Right 04/23/2015    Procedure: EXAM UNDER ANESTHESIA AND VAC CHANGE RIGHT THIGH;  Surgeon: Glenna FellowsBrinda Thimmappa, MD;  [facility];  Service: Plastics;  Laterality: Right;  . Amputation Right 05/17/2015    Procedure: Right 1st and 2nd Toe Amputation;  Surgeon: Nadara MustardMarcus Duda V, MD;  [facility];  Service: Orthopedics;  Laterality: Right;    [name] is a 59 y.o. male patient, arrived to floor in room [phone]. Patient alert and oriented X 4. No acute distress noted. Complains of 7/10 pain in right foot and left hand and wrist.   Vital signs: Oral temperature 97.9 F (36.6 C), Blood pressure 134/80, Pulse 95, RR 17, SpO2 99 % on room air. Height 6'3", 99.7 kg.   Cardiac monitoring: Telemetry box 5W #18 in place.  IV access: Pt has a PICC line with a double lumen in his right upper arm; site has no redness.  Skin: wound to right upper thigh  Patient's ID armband verified with patient and  in place. Information packet given to patient. Fall risk assessed, SR up X2, patient able to verbalize understanding of risks associated with falls and to call nurse or staff to assist before getting out of bed. Patient oriented to room and equipment. Call bell within reach.

## 2015-07-06 DIAGNOSIS — M79601 Pain in right arm: Secondary | ICD-10-CM

## 2015-07-06 DIAGNOSIS — A4101 Sepsis due to Methicillin susceptible Staphylococcus aureus: Secondary | ICD-10-CM

## 2015-07-06 DIAGNOSIS — M79604 Pain in right leg: Secondary | ICD-10-CM

## 2015-07-06 DIAGNOSIS — I330 Acute and subacute infective endocarditis: Secondary | ICD-10-CM

## 2015-07-06 LAB — CBC
HEMATOCRIT: 32.9 % — AB (ref 39.0–52.0)
HEMOGLOBIN: 10 g/dL — AB (ref 13.0–17.0)
MCH: 26 pg (ref 26.0–34.0)
MCHC: 30.4 g/dL (ref 30.0–36.0)
MCV: 85.7 fL (ref 78.0–100.0)
Platelets: 225 10*3/uL (ref 150–400)
RBC: 3.84 MIL/uL — ABNORMAL LOW (ref 4.22–5.81)
RDW: 17.3 % — AB (ref 11.5–15.5)
WBC: 5.3 10*3/uL (ref 4.0–10.5)

## 2015-07-06 LAB — HEPARIN LEVEL (UNFRACTIONATED)
Heparin Unfractionated: <0.1 IU/mL — ABNORMAL LOW (ref 0.30–0.70)
Heparin Unfractionated: 0.35 IU/mL (ref 0.30–0.70)

## 2015-07-06 LAB — PROTIME-INR
INR: 1.43 (ref 0.00–1.49)
Prothrombin Time: 17.5 seconds — ABNORMAL HIGH (ref 11.6–15.2)

## 2015-07-06 MED ORDER — HEPARIN BOLUS VIA INFUSION
2000.0000 [IU] | Freq: Once | INTRAVENOUS | Status: AC
  Administered 2015-07-06: 2000 [IU] via INTRAVENOUS
  Filled 2015-07-06: qty 2000

## 2015-07-06 NOTE — Progress Notes (Signed)
[facility]    Subjective:  Left wrist still hurts, numbness in fingers,   Antibiotics:  Anti-infectives    Start     Dose/Rate Route Frequency Ordered Stop   07/05/15 1500  ceFAZolin (ANCEF) IVPB 2 g/50 mL premix     2 g 100 mL/hr over 30 Minutes Intravenous 3 times per day 07/05/15 1431     07/05/15 1000  valACYclovir (VALTREX) tablet 1,000 mg  Status:  Discontinued     1,000 mg Oral Daily 07/05/15 0013 07/05/15 0017      Medications: Scheduled Meds: . atorvastatin  80 mg Oral Daily  .  ceFAZolin (ANCEF) IV  2 g Intravenous 3 times per day  . docusate sodium  100 mg Oral BID  . DULoxetine  30 mg Oral Daily  . flecainide  100 mg Oral Q12H  . gabapentin  100 mg Oral TID  . hydrocerin   Topical Daily  . metoprolol tartrate  75 mg Oral BID  . morphine  90 mg Oral Q12H  . multivitamin with minerals  1 tablet Oral Daily  . naloxegol oxalate  25 mg Oral QAC breakfast  . polyethylene glycol  17 g Oral Daily  . sodium chloride  3 mL Intravenous Q12H  . temazepam  15-30 mg Oral QHS  . venlafaxine XR  75 mg Oral Q breakfast   Continuous Infusions: . heparin 2,000 Units/hr (07/06/15 1247)   PRN Meds:.albuterol, morphine injection, oxycodone, sodium chloride, trifluridine    Objective: Weight change:   Intake/Output Summary (Last 24 hours) at 07/06/15 1450 Last data filed at 07/06/15 1121  Gross per 24 hour  Intake 116.27 ml  Output   2400 ml  Net -2283.73 ml   Blood pressure 126/76, pulse 72, temperature 97.5 F (36.4 C), temperature source Oral, resp. rate 20, height  (1.905 m), weight 219 lb 12.8 oz (99.701 kg), SpO2 100 %. Temp:  [97.5 F (36.4 C)-98.4 F (36.9 C)] 97.5 F (36.4 C) (12/10 1400) Pulse Rate:  [69-90] 72 (12/10 1400) Resp:  [18-20] 20 (12/10 1400) BP: (102-126)/(66-80) 126/76 mmHg (12/10 1400) SpO2:  [98 %-100 %] 100 % (12/10 1400)  Physical Exam: General: Alert and awake, oriented x3, not  in any acute distress. 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: clean        Other surgical sites were clean today  Neuro: nonfocal  CBC:  CBC Latest Ref Rng 07/06/2015 07/05/2015 05/20/2015  WBC 4.0 - 10.5 K/uL 5.3 11.0(H) 8.0  Hemoglobin 13.0 - 17.0 g/dL 10.0(L) 11.3(L) 7.7(L)  Hematocrit 39.0 - 52.0 % 32.9(L) 37.5(L) 26.4(L)  Platelets 150 - 400 K/uL 225 318 260      BMET  Recent Labs  07/05/15 0140  NA 135  K 3.9  CL 104  CO2 22  GLUCOSE  110*  BUN 14  CREATININE 0.77  CALCIUM 9.1     Liver Panel   Recent Labs  07/05/15 0140  PROT 7.9  ALBUMIN 3.6  AST 20  ALT 10*  ALKPHOS 96  BILITOT 0.3       Sedimentation Rate  Recent Labs  07/05/15 1817  ESRSEDRATE 36*   C-Reactive Protein  Recent Labs  07/05/15 1817  CRP 5.6*    Micro Results: Recent Results (from the past 720 hour(s))  Culture, blood (routine x 2)     Status: None (Preliminary result)   Collection Time: 07/05/15  1:40 AM  Result Value Ref Range Status   Specimen Description BLOOD BLOOD LEFT FOREARM  Final   Special Requests BOTTLES DRAWN AEROBIC ONLY 5CC  Final   Culture NO GROWTH 1 DAY  Final   Report Status PENDING  Incomplete  Culture, blood (routine x 2)     Status: None (Preliminary result)   Collection Time: 07/05/15  1:45 AM  Result Value Ref Range Status   Specimen Description BLOOD LEFT HAND  Final   Special Requests IN PEDIATRIC BOTTLE 3CC  Final   Culture NO GROWTH  1 DAY  Final   Report Status PENDING  Incomplete    Studies/Results: Dg Chest 1 View  07/05/2015  CLINICAL DATA:  Preop. EXAM: CHEST 1 VIEW COMPARISON:  05/08/2015 FINDINGS: There is a right upper extremity PICC with tip at the upper cavoatrial junction. Normal heart size and mediastinal contours. There is no edema, consolidation, effusion, or pneumothorax. IMPRESSION: No active disease. Electronically Signed   By: Marnee Spring M.D.   On: 07/05/2015 08:48   Mr Hand Left W Wo Contrast  07/05/2015  CLINICAL DATA:  Pain and swelling of the left wrist and hand. History of left radius fracture. EXAM: MR OF THE LEFT WRIST WITHOUT AND WITH CONTRAST TECHNIQUE: Multiplanar multisequence MR imaging of the left wrist was performed both before and after the administration of intravenous contrast. CONTRAST:  20mL MULTIHANCE GADOBENATE DIMEGLUMINE 529 MG/ML IV SOLN COMPARISON:  05/11/2015 FINDINGS: There is severe patient motion degrading image quality limiting evaluation. Ligaments: Nonvisualized lunotriquetral ligament. Nonvisualized scapholunate ligament. Triangular fibrocartilage: Limited visualization secondary to patient motion. Tendons: Intact flexor and extensor compartment tendons. Fluid in the extensor carpi radialis longus and brevis tendon sheath with synovial enhancement most consistent with tenosynovitis. Carpal tunnel/median nerve: Normal carpal tunnel. Normal median nerve. Guyon's canal: Normal. Joint/cartilage: Suboptimal visualization secondary to severe patient motion. There is apparent diffuse chondral loss and joint space narrowing. Bones/carpal alignment: Diffuse marrow edema in the distal radius, distal ulna, throughout the carpal bones and at the bases of the metacarpals most severe in the second metacarpal. Other: Diffuse soft tissue swelling and marked synovial thickening with enhancement. 1.9 x 0.8 x 1.2 cm peripherally enhancing fluid collection which appears to emanate from the dorsal  distal radioulnar joint. IMPRESSION: 1. Severely limited examination secondary to significant patient motion throughout the exam degrading image quality. 2. Severe edema involving the distal radius, distal ulna, throughout the carpal bones and bases of the metacarpals with severe synovial enhancement and extensive joint space narrowing most concerning for septic arthritis and osteomyelitis versus alternatively aggressive inflammatory or crystalline arthropathy. 1.9 x 0.8 x 1.2 cm peripherally enhancing fluid collection which appears to emanate from the dorsal distal radioulnar joint. 3. Tenosynovitis of the extensor carpi radialis longus and brevis which may be inflammatory versus infectious. Electronically Signed   By: Elige Ko   On: 07/05/2015 08:24  Mr Wrist Left W Wo Contrast  07/05/2015  CLINICAL DATA:  Pain and swelling of the left wrist and hand. History of left radius fracture. EXAM: MR OF THE LEFT WRIST WITHOUT AND WITH CONTRAST TECHNIQUE: Multiplanar multisequence MR imaging of the left wrist was performed both before and after the administration of intravenous contrast. CONTRAST:  20mL MULTIHANCE GADOBENATE DIMEGLUMINE 529 MG/ML IV SOLN COMPARISON:  05/11/2015 FINDINGS: There is severe patient motion degrading image quality limiting evaluation. Ligaments: Nonvisualized lunotriquetral ligament. Nonvisualized scapholunate ligament. Triangular fibrocartilage: Limited visualization secondary to patient motion. Tendons: Intact flexor and extensor compartment tendons. Fluid in the extensor carpi radialis longus and brevis tendon sheath with synovial enhancement most consistent with tenosynovitis. Carpal tunnel/median nerve: Normal carpal tunnel. Normal median nerve. Guyon's canal: Normal. Joint/cartilage: Suboptimal visualization secondary to severe patient motion. There is apparent diffuse chondral loss and joint space narrowing. Bones/carpal alignment: Diffuse marrow edema in the distal radius, distal  ulna, throughout the carpal bones and at the bases of the metacarpals most severe in the second metacarpal. Other: Diffuse soft tissue swelling and marked synovial thickening with enhancement. 1.9 x 0.8 x 1.2 cm peripherally enhancing fluid collection which appears to emanate from the dorsal distal radioulnar joint. IMPRESSION: 1. Severely limited examination secondary to significant patient motion throughout the exam degrading image quality. 2. Severe edema involving the distal radius, distal ulna, throughout the carpal bones and bases of the metacarpals with severe synovial enhancement and extensive joint space narrowing most concerning for septic arthritis and osteomyelitis versus alternatively aggressive inflammatory or crystalline arthropathy. 1.9 x 0.8 x 1.2 cm peripherally enhancing fluid collection which appears to emanate from the dorsal distal radioulnar joint. 3. Tenosynovitis of the extensor carpi radialis longus and brevis which may be inflammatory versus infectious. Electronically Signed   By: Elige Ko   On: 07/05/2015 08:24      Assessment/Plan:  INTERVAL HISTORY:   07/06/15: awaiting 2nd Ortho hand surgery opinion, [name] wanted pt to followup as an outpatient and continue IV abx--not acceptable tot he patient--NOR to me, he has been on IV abx for three months and he is losing feeling in fingers   Principal Problem:   Acute osteomyelitis of left ulna [facility]) Active Problems:   Chronic pain syndrome   Cellulitis of left hand   Arm pain, anterior   Cellulitis of finger of left hand   Cellulitis of left upper extremity   Abscess of left hand   Osteomyelitis (HCC)   Paroxysmal atrial fibrillation (HCC)    [name] is a 59 y.o. male with  MSSA bacteremia infected mycotic aneurysm and thigh abscess sp surgery by vascular surgery then plastic surgeries also with fractured wrist and abscess in hand status post I&D of hand abscess, thrombotic lesions to toes where he has  chronic wounds and areas that with MRI showing evidence of septic arthritis and osteomyelitis in his left wrist in October but no surgery performed (see above) with amputations of 2 toes by [name] protracted IV abx but despite this worsening and persistence of wrist pain, edema, and warmth nearly TWO months ago and now worsening through today with fingers going numb.   #1 Left wrist with increasing pain numbness in several fingers and new MRI nearly TWO months from the last wone showing evidence of persistent septic arhritis and osteo of ulna and radius with fluid collection between the two bones and now extensive edema icarpal bones and bases of the metacarpals , tenosynovitis of the extensor  carpi radialis longus and brevis  --this is ASSUREDLY all infection and likely due to his MSSA Bacteremia and mycotic aneurysm, thigh abscess which also set up show in his hand abscess in September  HE NEEDS SURGERY THIS ADMISSION  MY UNDERSTANDING IS A 2ND ORTHO HAND SURGEON IS COMING TO SEE THE PATIENT AND GIVE A 2ND OPINION  IF NONE OF OUR LOCAL HAND SURGEONS CAN MAKE A TIMELY INTERVENTION AND PERFORM SURGERY I WOULD TRANSFER THE PATIENT TO [facility] OR ANOTHER TERTIARY CARE CENTER  HE REALLY SHOULD HAVE HAD SURGERY BACK IN October AND I REGRET NOT INSISTING UPON IT THEN. I WAS HOPEFUL ANOTHER MRI BEFORE THE END OF DC FROM REHAB WOULD CHANGE HIS ORIGINAL HAND SURGEONS MIND  HE HAS BEEN ON IV ANTIBIOTICS FOR WELL OVER THREE MONTHS. HE HAS ALREADY HAD AGGRESSIVE VASCULAR --MX SURGERIES, PLASTIC SURGERY AND ORTHO TO TOES AND HAND ABSCESS I AND D BUT HE CLEARLY NEEDS THIS SEPTIC WRIST AND OSTEO INTERVENED UPON. PERHAPS HE CANNOT SALVAGE THE LIMB BUT IF SO SUCH FRANK CONVERSATIONS NEED TO BE HAD AND ACTION TAKEN  OK to hold ABX for deep operative culture.   LOS: 2 days   Acey LavCornelius Van Dam 07/06/2015, 2:50 PM

## 2015-07-06 NOTE — Progress Notes (Addendum)
ANTICOAGULATION CONSULT NOTE - Initial Consult  Pharmacy Consult for Heparin (holding warfarin) Indication: DVT  Allergies  Allergen Reactions  . Imitrex [Sumatriptan] Other (See Comments)    Chest pain, 2004 (tolerates 50 mg prn)  . Verapamil Other (See Comments)    Causes pvc's   Patient Measurements: Height: 6\' 3"  (190.5 cm) Weight: 219 lb 12.8 oz (99.701 kg) IBW/kg (Calculated) : 84.5  Vital Signs: Temp: 98.4 F (36.9 C) (12/10 0553) Temp Source: Oral (12/10 0553) BP: 112/70 mmHg (12/10 0553) Pulse Rate: 72 (12/10 0553)  Labs:  Recent Labs  07/05/15 0140 07/05/15 1023 07/05/15 1817 07/06/15 0521  HGB 11.3*  --   --  10.0*  HCT 37.5*  --   --  32.9*  PLT 318  --   --  225  APTT 39*  --   --   --   LABPROT 19.0*  --   --  17.5*  INR 1.59*  --   --  1.43  HEPARINUNFRC  --  <0.10* <0.10* <0.10*  CREATININE 0.77  --   --   --   TROPONINI <0.03  --   --   --     Estimated Creatinine Clearance: 121.8 mL/min (by C-G formula based on Cr of 0.77).   Medical History: Past Medical History  Diagnosis Date  . Hypertension   . Chronic knee pain   . Hyperlipemia   . PUD (peptic ulcer disease)   . Dysrhythmia   . Peripheral vascular disease (HCC)   . Anxiety   . GERD (gastroesophageal reflux disease)   . 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 Wound of right ankle 06/05/2015    Assessment: Recent discharged for staph aureus bacteremia with ruptured mycotic femoral artery aneurysm, on warfarin PTA for DVT, holding warfarin and continuing heparin pending any procedures, HL is still undetectable, no issues per RN.   Goal of Therapy:  Heparin level 0.3-0.7 units/ml Monitor platelets by anticoagulation protocol: Yes   Plan:  -Heparin 2000 units BOLUS -Increase heparin drip to 2000 units/hr -1400 HL -Daily CBC/HL -Monitor for bleeding  Abran DukeLedford, James 07/06/2015,6:35  AM  ______________________________________________________ 6 hr HL therapeutic at 0.35 Continue heparin gtt at 2000 units/hr Daily HL, CBC Monitor for S&S of bleed  Sandi CarneNick Cornel Werber, PharmD Pharmacy Resident Pager: [phone]

## 2015-07-06 NOTE — Progress Notes (Signed)
[facility] PROGRESS NOTE  Craig MunchJimmy F [name] ZOX:[id number] DOB: 08-May-1956 DOA: 07/04/2015 PCP: Patria ManeGASSEMI, MIKE, MD  Assessment/Plan: 1. Osteomyelitis of Wrist -[name] is a 59 year old gentleman having history of left wrist abscess status post incision and drainage, found to have evidence of osteomyelitis/septic joint involving left wrist from MRI dated 05/11/2015. -He has been treated with IN ancef in the outpatient setting.  -There was concern from his infectious disease specialist regarding progression of infection for which admission was recommended for hand surgery consultation. MRI of left wrist performed on 07/05/2015 revealing findings concerning again for septic arthritis and osteomyelitis involving carpal bones and bases of the metacarpals. -Today I discussed case with his hand surgeon Dr. Izora Ribasoley informing him of last night's MRI findings who recommended further treatment with IV antibiotic therapy and follow up at his office this Monday.  -Case discussed with Dr Ninetta LightsHatcher of Infectious Disease who recommended obtaining a second opinion from Hand Surgery. Per Dr Algis LimingVanDam a second opinion was sought from Dr Amanda PeaGramig who declined to see patient since he was already being seen by Dr Izora Ribasoley.  -Patient appears nontoxic, hemodynamically stable for the time being.  -We are awaiting a second opinion from Hand Surgery. Considering transfer to tertiary medical center as well.  -Antimicrobial therapy has been held in the hopes of surgical intervention in the near future for obtaining better cultures.   2. 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 Lajoyce Cornersuda of orthopedic surgery on 05/17/2015 -Surgical wound appears to be healing well, no evidence of active infection.  -Wound consulted   3.  Right femoral abscess/right femoral aneurysm -Status post excision of ruptured right mycotic SFA, with right thigh incision and drainage  of abscess -Surgical incision site appears clean, no evidence of active infection  4.  History of DVT of right lower extremity -Coumadin was held given the possibility of surgery. -INR now subtherapeutic, pharmacy consulted for IV heparin dosing.   Code Status: Full Code Family Communication: Spoke to his wife and daughter were present at bedside Disposition Plan: Starting Ancef, in process of obtaining second opinion orthopedic consult   Consultants:  Infectious disease  Antibiotics:  Ancef  HPI/Subjective: [name] is a pleasant 59 year old gentleman with a past medical history of hypertension, dyslipidemia, peripheral last her disease, having a history of MSSA bacteremia, right femoral abscess, right femoral aneurysm (S/p excision of ruptured R mycotic SFA) , left hand abscess (S/p I&D)  for which she was hospitalized in September 2016. He was eventually discharged on Ancef  for a total of 6 weeks. Patient was transferred to acute rehabilitation however came back to the hospital about a week later with fever. For the workup of left hand with MRI revealed findings concerning for osteomyelitis. Patient was also found to have gangrene of right great toe and second toe, undergoing first and second ray amputation, procedure performed by Dr. Lajoyce Cornersuda of Orthopedic Surgery. Craig. Caryn [name] was discharged on an additional 8 weeks of IV Ancef.  Concern for persistent left wrist pain associate with swelling consistent with further progression of infection. MRI of left hand performed on 07/05/2015 showing findings concerning for septic arthritis and osteomyelitis   Objective: Filed Vitals:   07/06/15 0937 07/06/15 1400  BP: 124/80 126/76  Pulse: 90 72  Temp: 98.3 F (36.8 C) 97.5 F (36.4 C)  Resp: 18 20    Intake/Output Summary (Last 24 hours) at 07/06/15 1730 Last data filed  at 07/06/15 1600  Gross per 24 hour  Intake 1332.16 ml  Output   2700 ml  Net -1367.84 ml   Filed Weights   07/04/15  2035  Weight: 99.701 kg (219 lb 12.8 oz)    Exam:   General:  Patient is awake and alert, nontoxic-appearing  Cardiovascular: Regular rate and rhythm normal S1-S2  Respiratory: Normal respiratory effort, lungs are clear to auscultation bilaterally  Abdomen: Soft nontender nondistended positive bowel sounds  Musculoskeletal: There is swelling surrounding left wrist, having some associated pain with passive and active movement There was mild erythema. I did not palpate fluctuant masses or notice purulence. On his right foot status post first and second ray amputations, no evidence of active infection.  Data Reviewed: Basic Metabolic Panel:  Recent Labs Lab 07/05/15 0140  NA 135  K 3.9  CL 104  CO2 22  GLUCOSE 110*  BUN 14  CREATININE 0.77  CALCIUM 9.1  MG 1.9   Liver Function Tests:  Recent Labs Lab 07/05/15 0140  AST 20  ALT 10*  ALKPHOS 96  BILITOT 0.3  PROT 7.9  ALBUMIN 3.6   No results for input(s): LIPASE, AMYLASE in the last 168 hours. No results for input(s): AMMONIA in the last 168 hours. CBC:  Recent Labs Lab 07/05/15 0140 07/06/15 0521  WBC 11.0* 5.3  NEUTROABS 6.1  --   HGB 11.3* 10.0*  HCT 37.5* 32.9*  MCV 85.2 85.7  PLT 318 225   Cardiac Enzymes:  Recent Labs Lab 07/05/15 0140  TROPONINI <0.03   BNP (last 3 results)  Recent Labs  07/05/15 0140  BNP 20.4    ProBNP (last 3 results) No results for input(s): PROBNP in the last 8760 hours.  CBG: No results for input(s): GLUCAP in the last 168 hours.  Recent Results (from the past 240 hour(s))  Culture, blood (routine x 2)     Status: None (Preliminary result)   Collection Time: 07/05/15  1:40 AM  Result Value Ref Range Status   Specimen Description BLOOD BLOOD LEFT FOREARM  Final   Special Requests BOTTLES DRAWN AEROBIC ONLY 5CC  Final   Culture NO GROWTH 1 DAY  Final   Report Status PENDING  Incomplete  Culture, blood (routine x 2)     Status: None (Preliminary result)    Collection Time: 07/05/15  1:45 AM  Result Value Ref Range Status   Specimen Description BLOOD LEFT HAND  Final   Special Requests IN PEDIATRIC BOTTLE 3CC  Final   Culture NO GROWTH 1 DAY  Final   Report Status PENDING  Incomplete     Studies: Dg Chest 1 View  07/05/2015  CLINICAL DATA:  Preop. EXAM: CHEST 1 VIEW COMPARISON:  05/08/2015 FINDINGS: There is a right upper extremity PICC with tip at the upper cavoatrial junction. Normal heart size and mediastinal contours. There is no edema, consolidation, effusion, or pneumothorax. IMPRESSION: No active disease. Electronically Signed   By: Marnee Spring M.D.   On: 07/05/2015 08:48   Craig Hand Left W Wo Contrast  07/05/2015  CLINICAL DATA:  Pain and swelling of the left wrist and hand. History of left radius fracture. EXAM: Craig OF THE LEFT WRIST WITHOUT AND WITH CONTRAST TECHNIQUE: Multiplanar multisequence Craig imaging of the left wrist was performed both before and after the administration of intravenous contrast. CONTRAST:  20mL MULTIHANCE GADOBENATE DIMEGLUMINE 529 MG/ML IV SOLN COMPARISON:  05/11/2015 FINDINGS: There is severe patient motion degrading image quality limiting evaluation. Ligaments: Nonvisualized  lunotriquetral ligament. Nonvisualized scapholunate ligament. Triangular fibrocartilage: Limited visualization secondary to patient motion. Tendons: Intact flexor and extensor compartment tendons. Fluid in the extensor carpi radialis longus and brevis tendon sheath with synovial enhancement most consistent with tenosynovitis. Carpal tunnel/median nerve: Normal carpal tunnel. Normal median nerve. Guyon's canal: Normal. Joint/cartilage: Suboptimal visualization secondary to severe patient motion. There is apparent diffuse chondral loss and joint space narrowing. Bones/carpal alignment: Diffuse marrow edema in the distal radius, distal ulna, throughout the carpal bones and at the bases of the metacarpals most severe in the second metacarpal. Other:  Diffuse soft tissue swelling and marked synovial thickening with enhancement. 1.9 x 0.8 x 1.2 cm peripherally enhancing fluid collection which appears to emanate from the dorsal distal radioulnar joint. IMPRESSION: 1. Severely limited examination secondary to significant patient motion throughout the exam degrading image quality. 2. Severe edema involving the distal radius, distal ulna, throughout the carpal bones and bases of the metacarpals with severe synovial enhancement and extensive joint space narrowing most concerning for septic arthritis and osteomyelitis versus alternatively aggressive inflammatory or crystalline arthropathy. 1.9 x 0.8 x 1.2 cm peripherally enhancing fluid collection which appears to emanate from the dorsal distal radioulnar joint. 3. Tenosynovitis of the extensor carpi radialis longus and brevis which may be inflammatory versus infectious. Electronically Signed   By: Elige Ko   On: 07/05/2015 08:24   Craig Wrist Left W Wo Contrast  07/05/2015  CLINICAL DATA:  Pain and swelling of the left wrist and hand. History of left radius fracture. EXAM: Craig OF THE LEFT WRIST WITHOUT AND WITH CONTRAST TECHNIQUE: Multiplanar multisequence Craig imaging of the left wrist was performed both before and after the administration of intravenous contrast. CONTRAST:  20mL MULTIHANCE GADOBENATE DIMEGLUMINE 529 MG/ML IV SOLN COMPARISON:  05/11/2015 FINDINGS: There is severe patient motion degrading image quality limiting evaluation. Ligaments: Nonvisualized lunotriquetral ligament. Nonvisualized scapholunate ligament. Triangular fibrocartilage: Limited visualization secondary to patient motion. Tendons: Intact flexor and extensor compartment tendons. Fluid in the extensor carpi radialis longus and brevis tendon sheath with synovial enhancement most consistent with tenosynovitis. Carpal tunnel/median nerve: Normal carpal tunnel. Normal median nerve. Guyon's canal: Normal. Joint/cartilage: Suboptimal  visualization secondary to severe patient motion. There is apparent diffuse chondral loss and joint space narrowing. Bones/carpal alignment: Diffuse marrow edema in the distal radius, distal ulna, throughout the carpal bones and at the bases of the metacarpals most severe in the second metacarpal. Other: Diffuse soft tissue swelling and marked synovial thickening with enhancement. 1.9 x 0.8 x 1.2 cm peripherally enhancing fluid collection which appears to emanate from the dorsal distal radioulnar joint. IMPRESSION: 1. Severely limited examination secondary to significant patient motion throughout the exam degrading image quality. 2. Severe edema involving the distal radius, distal ulna, throughout the carpal bones and bases of the metacarpals with severe synovial enhancement and extensive joint space narrowing most concerning for septic arthritis and osteomyelitis versus alternatively aggressive inflammatory or crystalline arthropathy. 1.9 x 0.8 x 1.2 cm peripherally enhancing fluid collection which appears to emanate from the dorsal distal radioulnar joint. 3. Tenosynovitis of the extensor carpi radialis longus and brevis which may be inflammatory versus infectious. Electronically Signed   By: Elige Ko   On: 07/05/2015 08:24    Scheduled Meds: . atorvastatin  80 mg Oral Daily  . docusate sodium  100 mg Oral BID  . DULoxetine  30 mg Oral Daily  . flecainide  100 mg Oral Q12H  . gabapentin  100 mg Oral TID  . hydrocerin  Topical Daily  . metoprolol tartrate  75 mg Oral BID  . morphine  90 mg Oral Q12H  . multivitamin with minerals  1 tablet Oral Daily  . naloxegol oxalate  25 mg Oral QAC breakfast  . polyethylene glycol  17 g Oral Daily  . sodium chloride  3 mL Intravenous Q12H  . temazepam  15-30 mg Oral QHS  . venlafaxine XR  75 mg Oral Q breakfast   Continuous Infusions: . heparin 2,000 Units/hr (07/06/15 1247)    Principal Problem:   Acute osteomyelitis of left ulna (HCC) Active  Problems:   Chronic pain syndrome   Cellulitis of left hand   Arm pain, anterior   Cellulitis of finger of left hand   Cellulitis of left upper extremity   Abscess of left hand   Osteomyelitis (HCC)   Paroxysmal atrial fibrillation (HCC)    Time spent: 15 min    Jeralyn Bennett  [facility] Pager [phone]. If 7PM-7AM, please contact night-coverage at [email or web address], password [facility] 07/06/2015, 5:30 PM  LOS: 2 days

## 2015-07-07 LAB — BASIC METABOLIC PANEL
Anion gap: 6 (ref 5–15)
BUN: 15 mg/dL (ref 6–20)
CALCIUM: 8.8 mg/dL — AB (ref 8.9–10.3)
CO2: 27 mmol/L (ref 22–32)
CREATININE: 0.82 mg/dL (ref 0.61–1.24)
Chloride: 102 mmol/L (ref 101–111)
GFR calc Af Amer: >60 mL/min (ref >60)
GLUCOSE: 106 mg/dL — AB (ref 65–99)
Potassium: 4.2 mmol/L (ref 3.5–5.1)
Sodium: 135 mmol/L (ref 135–145)

## 2015-07-07 LAB — CBC
HCT: 34.8 % — ABNORMAL LOW (ref 39.0–52.0)
Hemoglobin: 10.5 g/dL — ABNORMAL LOW (ref 13.0–17.0)
MCH: 25.9 pg — AB (ref 26.0–34.0)
MCHC: 30.2 g/dL (ref 30.0–36.0)
MCV: 85.9 fL (ref 78.0–100.0)
PLATELETS: 246 10*3/uL (ref 150–400)
RBC: 4.05 MIL/uL — ABNORMAL LOW (ref 4.22–5.81)
RDW: 16.9 % — AB (ref 11.5–15.5)
WBC: 5.3 10*3/uL (ref 4.0–10.5)

## 2015-07-07 LAB — PROTIME-INR
INR: 1.24 (ref 0.00–1.49)
Prothrombin Time: 15.7 seconds — ABNORMAL HIGH (ref 11.6–15.2)

## 2015-07-07 LAB — HEPARIN LEVEL (UNFRACTIONATED): HEPARIN UNFRACTIONATED: 0.11 [IU]/mL — AB (ref 0.30–0.70)

## 2015-07-07 MED ORDER — SODIUM CHLORIDE 0.9 % IJ SOLN: 10.0000 mL | INTRAMUSCULAR | Status: DC | PRN

## 2015-07-07 MED ORDER — ENOXAPARIN SODIUM 100 MG/ML ~~LOC~~ SOLN
100.0000 mg | Freq: Two times a day (BID) | SUBCUTANEOUS | Status: DC
  Administered 2015-07-07 – 2015-07-11 (×8): 100 mg via SUBCUTANEOUS
  Filled 2015-07-07 (×8): qty 1

## 2015-07-07 MED ORDER — HEPARIN BOLUS VIA INFUSION
3000.0000 [IU] | Freq: Once | INTRAVENOUS | Status: AC
  Administered 2015-07-07: 3000 [IU] via INTRAVENOUS
  Filled 2015-07-07: qty 3000

## 2015-07-07 MED ORDER — SODIUM CHLORIDE 0.9 % IJ SOLN
10.0000 mL | Freq: Two times a day (BID) | INTRAMUSCULAR | Status: DC
  Administered 2015-07-07 – 2015-07-08 (×4): 10 mL via INTRAVENOUS

## 2015-07-07 MED ORDER — SODIUM CHLORIDE 0.9 % IJ SOLN
10.0000 mL | Freq: Two times a day (BID) | INTRAMUSCULAR | Status: DC
  Administered 2015-07-07 – 2015-07-09 (×4): 10 mL via INTRAVENOUS

## 2015-07-07 NOTE — Progress Notes (Signed)
[facility] PROGRESS NOTE  [name] EXB:[id number] DOB: Jun 21, 1956 DOA: 07/04/2015 PCP: Patria ManeGASSEMI, MIKE, MD  Assessment/Plan: 1. Osteomyelitis of Wrist -[name] is a 59 year old gentleman having history of left wrist abscess status post incision and drainage, found to have evidence of osteomyelitis/septic joint involving left wrist from MRI dated 05/11/2015. -He has been treated with IN ancef in the outpatient setting.  -There was concern from his infectious disease specialist regarding progression of infection for which admission was recommended for hand surgery consultation. MRI of left wrist performed on 07/05/2015 revealing findings concerning again for septic arthritis and osteomyelitis involving carpal bones and bases of the metacarpals. -Today I discussed case with his hand surgeon Dr. Izora Ribasoley informing him of last night's MRI findings who recommended further treatment with IV antibiotic therapy and follow up at his office this Monday.  -Case discussed with Dr Ninetta LightsHatcher of Infectious Disease who recommended obtaining a second opinion from Hand Surgery. Per Dr Algis LimingVanDam a second opinion was sought from Dr Amanda PeaGramig who declined to see patient since he was already being seen by Dr Izora Ribasoley.  -Patient appears nontoxic, hemodynamically stable for the time being.  -We are awaiting a second opinion from Hand Surgery. Considering transfer to tertiary medical center as well.  -Antimicrobial therapy has been held in the hopes of surgical intervention in the near future for increasing yield of cultures.  -On 07/07/2015 he remains stable, nontoxic, afebrile, tolerating PO   2. 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 Lajoyce Cornersuda of orthopedic surgery on 05/17/2015 -Surgical wound appears to be healing well, no evidence of active infection.  -Wound care consulted   3.  Right femoral abscess/right femoral aneurysm -Status post  excision of ruptured right mycotic SFA, with right thigh incision and drainage of abscess -Surgical incision site appears clean, no evidence of active infection  4.  History of DVT of right lower extremity -Coumadin was held given the possibility of surgery. -INR now subtherapeutic, pharmacy consulted for IV heparin dosing.   Code Status: Full Code Family Communication: Spoke to his wife and daughter were present at bedside Disposition Plan: Starting Ancef, in process of obtaining second opinion orthopedic consult   Consultants:  Infectious disease  Antibiotics:  Ancef  HPI/Subjective: [name] is a pleasant 59 year old gentleman with a past medical history of hypertension, dyslipidemia, peripheral last her disease, having a history of MSSA bacteremia, right femoral abscess, right femoral aneurysm (S/p excision of ruptured R mycotic SFA) , left hand abscess (S/p I&D)  for which she was hospitalized in September 2016. He was eventually discharged on Ancef  for a total of 6 weeks. Patient was transferred to acute rehabilitation however came back to the hospital about a week later with fever. For the workup of left hand with MRI revealed findings concerning for osteomyelitis. Patient was also found to have gangrene of right great toe and second toe, undergoing first and second ray amputation, procedure performed by Dr. Lajoyce Cornersuda of Orthopedic Surgery. [name] was discharged on an additional 8 weeks of IV Ancef.  Concern for persistent left wrist pain associate with swelling consistent with further progression of infection. MRI of left hand performed on 07/05/2015 showing findings concerning for septic arthritis and osteomyelitis   Objective: Filed Vitals:   07/07/15 0515 07/07/15 1341  BP: 96/64 109/72  Pulse: 68 71  Temp: 97.8 F (36.6 C) 98.1 F (36.7 C)  Resp: 15 18  Intake/Output Summary (Last 24 hours) at 07/07/15 1525 Last data filed at 07/07/15 0900  Gross per 24 hour  Intake  814.33 ml  Output    400 ml  Net 414.33 ml   Filed Weights   07/04/15 2035  Weight: 99.701 kg (219 lb 12.8 oz)    Exam:   General:  Patient is awake and alert, nontoxic-appearing  Cardiovascular: Regular rate and rhythm normal S1-S2  Respiratory: Normal respiratory effort, lungs are clear to auscultation bilaterally  Abdomen: Soft nontender nondistended positive bowel sounds  Musculoskeletal: There is swelling surrounding left wrist, having some associated pain with passive and active movement There was mild erythema. I did not palpate fluctuant masses or notice purulence. On his right foot status post first and second ray amputations, no evidence of active infection.  Data Reviewed: Basic Metabolic Panel:  Recent Labs Lab 07/05/15 0140 07/07/15 0511  NA 135 135  K 3.9 4.2  CL 104 102  CO2 22 27  GLUCOSE 110* 106*  BUN 14 15  CREATININE 0.77 0.82  CALCIUM 9.1 8.8*  MG 1.9  --    Liver Function Tests:  Recent Labs Lab 07/05/15 0140  AST 20  ALT 10*  ALKPHOS 96  BILITOT 0.3  PROT 7.9  ALBUMIN 3.6   No results for input(s): LIPASE, AMYLASE in the last 168 hours. No results for input(s): AMMONIA in the last 168 hours. CBC:  Recent Labs Lab 07/05/15 0140 07/06/15 0521 07/07/15 0511  WBC 11.0* 5.3 5.3  NEUTROABS 6.1  --   --   HGB 11.3* 10.0* 10.5*  HCT 37.5* 32.9* 34.8*  MCV 85.2 85.7 85.9  PLT 318 225 246   Cardiac Enzymes:  Recent Labs Lab 07/05/15 0140  TROPONINI <0.03   BNP (last 3 results)  Recent Labs  07/05/15 0140  BNP 20.4    ProBNP (last 3 results) No results for input(s): PROBNP in the last 8760 hours.  CBG: No results for input(s): GLUCAP in the last 168 hours.  Recent Results (from the past 240 hour(s))  Culture, blood (routine x 2)     Status: None (Preliminary result)   Collection Time: 07/05/15  1:40 AM  Result Value Ref Range Status   Specimen Description BLOOD BLOOD LEFT FOREARM  Final   Special Requests BOTTLES  DRAWN AEROBIC ONLY 5CC  Final   Culture NO GROWTH 2 DAYS  Final   Report Status PENDING  Incomplete  Culture, blood (routine x 2)     Status: None (Preliminary result)   Collection Time: 07/05/15  1:45 AM  Result Value Ref Range Status   Specimen Description BLOOD LEFT HAND  Final   Special Requests IN PEDIATRIC BOTTLE 3CC  Final   Culture NO GROWTH 2 DAYS  Final   Report Status PENDING  Incomplete     Studies: No results found.  Scheduled Meds: . atorvastatin  80 mg Oral Daily  . docusate sodium  100 mg Oral BID  . DULoxetine  30 mg Oral Daily  . enoxaparin (LOVENOX) injection  100 mg Subcutaneous Q12H  . flecainide  100 mg Oral Q12H  . gabapentin  100 mg Oral TID  . hydrocerin   Topical Daily  . metoprolol tartrate  75 mg Oral BID  . morphine  90 mg Oral Q12H  . multivitamin with minerals  1 tablet Oral Daily  . naloxegol oxalate  25 mg Oral QAC breakfast  . polyethylene glycol  17 g Oral Daily  . sodium chloride  10 mL Intravenous Q12H  . sodium chloride  10 mL Intravenous Q12H  . temazepam  15-30 mg Oral QHS  . venlafaxine XR  75 mg Oral Q breakfast   Continuous Infusions:    Principal Problem:   Acute osteomyelitis of left ulna (HCC) Active Problems:   Chronic pain syndrome   Cellulitis of left hand   Arm pain, anterior   Cellulitis of finger of left hand   Cellulitis of left upper extremity   Abscess of left hand   Osteomyelitis (HCC)   Paroxysmal atrial fibrillation (HCC)    Time spent: 15 min    Jeralyn Bennett  [facility] Pager [phone]. If 7PM-7AM, please contact night-coverage at [email or web address], password [facility] 07/07/2015, 3:25 PM  LOS: 3 days

## 2015-07-07 NOTE — Progress Notes (Signed)
[facility]    Subjective:  Left wrist still hurts, numbness in fingers   Antibiotics:  Anti-infectives    Start     Dose/Rate Route Frequency Ordered Stop   07/05/15 1500  ceFAZolin (ANCEF) IVPB 2 g/50 mL premix  Status:  Discontinued     2 g 100 mL/hr over 30 Minutes Intravenous 3 times per day 07/05/15 1431 07/06/15 1501   07/05/15 1000  valACYclovir (VALTREX) tablet 1,000 mg  Status:  Discontinued     1,000 mg Oral Daily 07/05/15 0013 07/05/15 0017      Medications: Scheduled Meds: . atorvastatin  80 mg Oral Daily  . docusate sodium  100 mg Oral BID  . DULoxetine  30 mg Oral Daily  . enoxaparin (LOVENOX) injection  100 mg Subcutaneous Q12H  . flecainide  100 mg Oral Q12H  . gabapentin  100 mg Oral TID  . hydrocerin   Topical Daily  . metoprolol tartrate  75 mg Oral BID  . morphine  90 mg Oral Q12H  . multivitamin with minerals  1 tablet Oral Daily  . naloxegol oxalate  25 mg Oral QAC breakfast  . polyethylene glycol  17 g Oral Daily  . sodium chloride  10 mL Intravenous Q12H  . sodium chloride  10 mL Intravenous Q12H  . temazepam  15-30 mg Oral QHS  . venlafaxine XR  75 mg Oral Q breakfast   Continuous Infusions:   PRN Meds:.albuterol, morphine injection, oxycodone, sodium chloride, sodium chloride, trifluridine    Objective: Weight change:   Intake/Output Summary (Last 24 hours) at 07/07/15 1953 Last data filed at 07/07/15 1914  Gross per 24 hour  Intake   1230 ml  Output   1000 ml  Net    230 ml   Blood pressure 109/72, pulse 71, temperature 98.1 F (36.7 C), temperature source Oral, resp. rate 18, height  (1.905 m), weight 219 lb 12.8 oz (99.701 kg), SpO2 100 %. Temp:  [97.6 F (36.4 C)-98.1 F (36.7 C)] 98.1 F (36.7 C) (12/11 1341) Pulse Rate:  [68-86] 71 (12/11 1341) Resp:  [15-18] 18 (12/11 1341) BP: (96-112)/(64-82) 109/72 mmHg (12/11 1341) SpO2:  [98 %-100 %] 100 % (12/11  1341)  Physical Exam: General: Alert and awake, oriented x3, not in any acute distress. 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: clean        Other surgical sites were clean today  Neuro: nonfocal  CBC:  CBC Latest Ref Rng 07/07/2015 07/06/2015 07/05/2015  WBC 4.0 - 10.5 K/uL 5.3 5.3 11.0(H)  Hemoglobin 13.0 - 17.0 g/dL 10.5(L) 10.0(L) 11.3(L)  Hematocrit 39.0 - 52.0 % 34.8(L) 32.9(L) 37.5(L)  Platelets 150 - 400 K/uL 246 225 318      BMET  Recent Labs  07/05/15 0140 07/07/15 0511  NA  135 135  K 3.9 4.2  CL 104 102  CO2 22 27  GLUCOSE 110* 106*  BUN 14 15  CREATININE 0.77 0.82  CALCIUM 9.1 8.8*     Liver Panel   Recent Labs  07/05/15 0140  PROT 7.9  ALBUMIN 3.6  AST 20  ALT 10*  ALKPHOS 96  BILITOT 0.3       Sedimentation Rate  Recent Labs  07/05/15 1817  ESRSEDRATE 36*   C-Reactive Protein  Recent Labs  07/05/15 1817  CRP 5.6*    Micro Results: Recent Results (from the past 720 hour(s))  Culture, blood (routine x 2)     Status: None (Preliminary result)   Collection Time: 07/05/15  1:40 AM  Result Value Ref Range Status   Specimen Description BLOOD BLOOD LEFT FOREARM  Final   Special Requests BOTTLES DRAWN AEROBIC ONLY 5CC  Final   Culture NO GROWTH 2 DAYS  Final   Report Status PENDING  Incomplete  Culture, blood (routine x 2)     Status: None (Preliminary result)   Collection Time: 07/05/15  1:45 AM  Result Value Ref Range Status    Specimen Description BLOOD LEFT HAND  Final   Special Requests IN PEDIATRIC BOTTLE 3CC  Final   Culture NO GROWTH 2 DAYS  Final   Report Status PENDING  Incomplete    Studies/Results: No results found.    Assessment/Plan:  INTERVAL HISTORY:   07/06/15: awaiting 2nd Ortho hand surgery opinion, Dr. Izora Ribasoley wanted pt to followup as an outpatient and continue IV abx--not acceptable tot he patient--NOR to me, he has been on IV abx for three months and he is losing feeling in fingers  07/07/2015: Dr. Amanda PeaGramig has seen patient (patient reports) and he agrees patient needs surgery. He will (per patient also discuss with Dr. Izora Ribasoley)   Principal Problem:   Acute osteomyelitis of left ulna [facility](HCC) Active Problems:   Chronic pain syndrome   Cellulitis of left hand   Arm pain, anterior   Cellulitis of finger of left hand   Cellulitis of left upper extremity   Abscess of left hand   Osteomyelitis (HCC)   Paroxysmal atrial fibrillation (HCC)    [name] is a 59 y.o. male with  MSSA bacteremia infected mycotic aneurysm and thigh abscess sp surgery by vascular surgery then plastic surgeries also with fractured wrist and abscess in hand status post I&D of hand abscess, thrombotic lesions to toes where he has chronic wounds and areas that with MRI showing evidence of septic arthritis and osteomyelitis in his left wrist in October but no surgery performed (see above) with amputations of 2 toes by Dr. Lajoyce Cornersuda protracted IV abx but despite this worsening and persistence of wrist pain, edema, and warmth nearly TWO months ago and now worsening through today with fingers going numb.   #1 Left wrist with increasing pain numbness in several fingers and new MRI nearly TWO months from the last wone showing evidence of persistent septic arhritis and osteo of ulna and radius with fluid collection between the two bones and now extensive edema icarpal bones and bases of the metacarpals , tenosynovitis of the  extensor carpi radialis longus and brevis  --this is ASSUREDLY all infection and likely due to his MSSA Bacteremia and mycotic aneurysm, thigh abscess which also set up show in his hand abscess in September  HE NEEDS SURGERY THIS ADMISSION and I am happy to hear (per the patient) that Dr. Amanda PeaGramig has seen the  patient and that he agrees that surgery needs to be peformed  Continue to hold  hold ABX for deep operative cultures. He has been "marinating"' in cefazolin for 3 months so we may not recover an organism. Again the undoubted culprit has to be his MSSA  Dr. Ninetta Lights is back tomorrow  Many thanks to [facility], [facility] and to Dr. Amanda Pea   LOS: 3 days   Acey Lav 07/07/2015, 7:53 PM

## 2015-07-07 NOTE — Progress Notes (Signed)
ANTICOAGULATION CONSULT NOTE  Pharmacy Consult for Heparin Indication: h/o DVT  Allergies  Allergen Reactions  . Imitrex [Sumatriptan] Other (See Comments)    Chest pain, 2004 (tolerates 50 mg prn)  . Verapamil Other (See Comments)    Causes pvc's   Patient Measurements: Height: 6\' 3"  (190.5 cm) Weight: 219 lb 12.8 oz (99.701 kg) IBW/kg (Calculated) : 84.5  Vital Signs: Temp: 97.8 F (36.6 C) (12/11 0515) Temp Source: Oral (12/11 0515) BP: 96/64 mmHg (12/11 0515) Pulse Rate: 68 (12/11 0515)  Labs:  Recent Labs  07/05/15 0140  07/06/15 0521 07/06/15 1440 07/07/15 0511  HGB 11.3*  --  10.0*  --  10.5*  HCT 37.5*  --  32.9*  --  34.8*  PLT 318  --  225  --  246  APTT 39*  --   --   --   --   LABPROT 19.0*  --  17.5*  --  15.7*  INR 1.59*  --  1.43  --  1.24  HEPARINUNFRC  --   < > <0.10* 0.35 0.11*  CREATININE 0.77  --   --   --  0.82  TROPONINI <0.03  --   --   --   --   < > = values in this interval not displayed.  Estimated Creatinine Clearance: 118.8 mL/min (by C-G formula based on Cr of 0.82).  Assessment: 59 y.o. male with h/o DVT, Coumadin on hold for possible surgery, for heparin  Goal of Therapy:  Heparin level 0.3-0.7 units/ml Monitor platelets by anticoagulation protocol: Yes   Plan:  Heparin 3000 units IV bolus, then increase heparin 2400 units/hr Check heparin level in 6 hours.   Eddie Candlebbott, Ammi Hutt [place] 07/07/2015,6:43 AM

## 2015-07-07 NOTE — Progress Notes (Signed)
ANTICOAGULATION CONSULT NOTE - Initial Consult  Pharmacy Consult for Lovenox Indication: DVT  Allergies  Allergen Reactions  . Imitrex [Sumatriptan] Other (See Comments)    Chest pain, 2004 (tolerates 50 mg prn)  . Verapamil Other (See Comments)    Causes pvc's    Patient Measurements: Height: 6\' 3"  (190.5 cm) Weight: 219 lb 12.8 oz (99.701 kg) IBW/kg (Calculated) : 84.5  Vital Signs: Temp: 97.8 F (36.6 C) (12/11 0515) Temp Source: Oral (12/11 0515) BP: 96/64 mmHg (12/11 0515) Pulse Rate: 68 (12/11 0515)  Labs:  Recent Labs  07/05/15 0140  07/06/15 0521 07/06/15 1440 07/07/15 0511  HGB 11.3*  --  10.0*  --  10.5*  HCT 37.5*  --  32.9*  --  34.8*  PLT 318  --  225  --  246  APTT 39*  --   --   --   --   LABPROT 19.0*  --  17.5*  --  15.7*  INR 1.59*  --  1.43  --  1.24  HEPARINUNFRC  --   < > <0.10* 0.35 0.11*  CREATININE 0.77  --   --   --  0.82  TROPONINI <0.03  --   --   --   --   < > = values in this interval not displayed.  Estimated Creatinine Clearance: 118.8 mL/min (by C-G formula based on Cr of 0.82).   Medical History: Past Medical History  Diagnosis Date  . Hypertension   . Chronic knee pain   . Hyperlipemia   . PUD (peptic ulcer disease)   . Dysrhythmia   . Peripheral vascular disease (HCC)   . Anxiety   . GERD (gastroesophageal reflux disease)   . 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 Wound of right ankle 06/05/2015   Assessment:  59 y/o M w/ h/o DVT. Warfarin is on hold for surgery and pt has been on heparin gtt. Pharmacy consulted to start Lovenox due to PAD and osteo making lab sticks difficult. Hgb 10.5, plts 246  Goal of Therapy:  Monitor platelets by anticoagulation protocol: Yes   Plan:  Discontinue heparin Lovenox SQ 100 mg q12h Daily CBC  Sandi CarneNick Kadance Mccuistion, PharmD Pharmacy Resident Pager: [phone] 07/07/2015,11:42 AM

## 2015-07-08 ENCOUNTER — Ambulatory Visit: Payer: Medicaid Other | Admitting: Infectious Disease

## 2015-07-08 LAB — SURGICAL PCR SCREEN
MRSA, PCR: NEGATIVE
Staphylococcus aureus: NEGATIVE

## 2015-07-08 LAB — CBC
HEMATOCRIT: 32.5 % — AB (ref 39.0–52.0)
HEMOGLOBIN: 9.8 g/dL — AB (ref 13.0–17.0)
MCH: 26.1 pg (ref 26.0–34.0)
MCHC: 30.2 g/dL (ref 30.0–36.0)
MCV: 86.7 fL (ref 78.0–100.0)
Platelets: 257 10*3/uL (ref 150–400)
RBC: 3.75 MIL/uL — ABNORMAL LOW (ref 4.22–5.81)
RDW: 16.8 % — AB (ref 11.5–15.5)
WBC: 5.1 10*3/uL (ref 4.0–10.5)

## 2015-07-08 LAB — PROTIME-INR
INR: 1.16 (ref 0.00–1.49)
PROTHROMBIN TIME: 15 s (ref 11.6–15.2)

## 2015-07-08 NOTE — Progress Notes (Signed)
INFECTIOUS DISEASE PROGRESS NOTE  ID: [name] is a 59 y.o. male with  Principal Problem:   Acute osteomyelitis of left ulna (HCC) Active Problems:   Chronic pain syndrome   Cellulitis of left hand   Arm pain, anterior   Cellulitis of finger of left hand   Cellulitis of left upper extremity   Abscess of left hand   Osteomyelitis (HCC)   Paroxysmal atrial fibrillation (HCC)  Subjective: Without complaints  Abtx:  Anti-infectives    Start     Dose/Rate Route Frequency Ordered Stop   07/05/15 1500  ceFAZolin (ANCEF) IVPB 2 g/50 mL premix  Status:  Discontinued     2 g 100 mL/hr over 30 Minutes Intravenous 3 times per day 07/05/15 1431 07/06/15 1501   07/05/15 1000  valACYclovir (VALTREX) tablet 1,000 mg  Status:  Discontinued     1,000 mg Oral Daily 07/05/15 0013 07/05/15 0017      Medications:  Scheduled: . atorvastatin  80 mg Oral Daily  . docusate sodium  100 mg Oral BID  . DULoxetine  30 mg Oral Daily  . enoxaparin (LOVENOX) injection  100 mg Subcutaneous Q12H  . flecainide  100 mg Oral Q12H  . gabapentin  100 mg Oral TID  . hydrocerin   Topical Daily  . metoprolol tartrate  75 mg Oral BID  . morphine  90 mg Oral Q12H  . multivitamin with minerals  1 tablet Oral Daily  . naloxegol oxalate  25 mg Oral QAC breakfast  . polyethylene glycol  17 g Oral Daily  . sodium chloride  10 mL Intravenous Q12H  . sodium chloride  10 mL Intravenous Q12H  . temazepam  15-30 mg Oral QHS  . venlafaxine XR  75 mg Oral Q breakfast    Objective: Vital signs in last 24 hours: Temp:  [97.9 F (36.6 C)-98.3 F (36.8 C)] 97.9 F (36.6 C) (12/12 0500) Pulse Rate:  [71-86] 76 (12/12 0500) Resp:  [16-18] 16 (12/12 0500) BP: (107-116)/(67-79) 107/79 mmHg (12/12 0500) SpO2:  [98 %-100 %] 98 % (12/11 1958)   General appearance: alert, cooperative and no distress Extremities: Left upper resume swelling unchanged. No change in heat. No erythema.  Trace bilateral lower extremity  edema  Lab Results  Recent Labs  07/07/15 0511 07/08/15 0644  WBC 5.3 5.1  HGB 10.5* 9.8*  HCT 34.8* 32.5*  NA 135  --   K 4.2  --   CL 102  --   CO2 27  --   BUN 15  --   CREATININE 0.82  --    Liver Panel No results for input(s): PROT, ALBUMIN, AST, ALT, ALKPHOS, BILITOT, BILIDIR, IBILI in the last 72 hours. Sedimentation Rate  Recent Labs  07/05/15 1817  ESRSEDRATE 36*   C-Reactive Protein  Recent Labs  07/05/15 1817  CRP 5.6*    Microbiology: Recent Results (from the past 240 hour(s))  Culture, blood (routine x 2)     Status: None (Preliminary result)   Collection Time: 07/05/15  1:40 AM  Result Value Ref Range Status   Specimen Description BLOOD BLOOD LEFT FOREARM  Final   Special Requests BOTTLES DRAWN AEROBIC ONLY 5CC  Final   Culture NO GROWTH 2 DAYS  Final   Report Status PENDING  Incomplete  Culture, blood (routine x 2)     Status: None (Preliminary result)   Collection Time: 07/05/15  1:45 AM  Result Value Ref Range Status   Specimen Description BLOOD  LEFT HAND  Final   Special Requests IN PEDIATRIC BOTTLE 3CC  Final   Culture NO GROWTH 2 DAYS  Final   Report Status PENDING  Incomplete    Studies/Results: No results found.   Assessment/Plan: Osteomyelitis of L wrist Prev mssa sepsis R foot wounds  Total days of antibiotics: off anbx  Will continue to watch off anbx Await surgery, deep Cx         [name] Infectious Diseases (pager) [phone] [email or web address] 07/08/2015, 10:54 AM  LOS: 4 days

## 2015-07-08 NOTE — Consult Note (Signed)
Reason for Consult:L hand infection Referring Physician: Infectious Disease/Medicine   CC:My hand hurts  HPI:  [name] is an 58 y.o. right handed male who is known to me from initial hospitalization months ago. Recently his L wrist became more warm, swollen, with some erythema; was hospitalized for ?worsening osteo of L wrist; he had been on home Ancef, with the initial plan of clearing the infection then proceeding with some sort of reconstructive wrist surgery.        .   Pain is rated at    4/10 and is described as sharp/dull.  Pain is intermittant.  Pain is made better by rest/immobilization, worse with motion.   Associated signs/symptoms: stiffness and decreased motion of L fingers, some altered sensation of LeftF, LF Previous treatment:  L Wrist:  Fractures, abscess (i&d'd)  Past Medical History  Diagnosis Date  . Hypertension   . Chronic knee pain   . Hyperlipemia   . PUD (peptic ulcer disease)   . Dysrhythmia   . Peripheral vascular disease ([place])   . Anxiety   . GERD (gastroesophageal reflux disease)   . Wrist osteomyelitis, right ([place]) 06/05/2015  . Foot osteomyelitis, right ([place]) 06/05/2015  . Wound of right ankle 06/05/2015    Past Surgical History  Procedure Laterality Date  . I&d extremity Left 07/12/2013    Procedure: I&D Left Thigh Abscess;  Surgeon: Wylene Simmer, MD;  Location: [place];  Service: Orthopedics;  Laterality: Left;  . Replacement total knee bilateral    . Tonsillectomy    . Hernia repair    . Cholecystectomy    . Appendectomy    . Shoulder surgery    . Elbow surgery    . 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 Incision and drainage abscess Right 04/11/2015    Procedure: INCISION AND DRAINAGE OF RIGHT THIGH ABSCESS;  Surgeon: Conrad Malcolm, MD;  Location: [place];  Service: Vascular;  Laterality:  Right;  . Incision and drainage abscess Right 04/13/2015    Procedure: INCISION AND DRAINAGE THIGH ABSCESS;  Surgeon: Conrad Antoine, MD;  Location: [place];  Service: Vascular;  Laterality: Right;  . I&d extremity Left 04/17/2015    Procedure: IRRIGATION AND DEBRIDEMENT LEFT EXTREMITY;  Surgeon: Conrad Mogul, MD;  Location: [place];  Service: Vascular;  Laterality: Left;  Marland Kitchen Muscle flap closure Right 04/19/2015    Procedure: RIGHT RECTUS ABDOMINUS  FLAP TO RIGHT GROIN;  Surgeon: Irene Limbo, MD;  Location: [place];  Service: Plastics;  Laterality: Right;  . Femoral revision Right 04/19/2015    Procedure: OVERSEW RIGHT FEMORAL VEIN ;  Surgeon: Angelia Mould, MD;  Location: [place];  Service: Vascular;  Laterality: Right;  . Wound exploration Right 04/23/2015    Procedure: EXAM UNDER ANESTHESIA AND VAC CHANGE RIGHT THIGH;  Surgeon: Irene Limbo, MD;  Location: [place];  Service: Plastics;  Laterality: Right;  . Amputation Right 05/17/2015    Procedure: Right 1st and 2nd Toe Amputation;  Surgeon: Newt Minion, MD;  Location: [place];  Service: Orthopedics;  Laterality: Right;    Family History  Problem Relation Age of Onset  . Heart attack Paternal Grandmother 56  . Heart disease Mother 71    arrythmia  . Stroke Paternal Grandfather   . Stroke Maternal Grandfather     Social  History:  reports that he has quit smoking. His smoking use included Cigarettes. He has never used smokeless tobacco. He reports that he does not drink alcohol or use illicit drugs.  Allergies:  Allergies  Allergen Reactions  . Imitrex [Sumatriptan] Other (See Comments)    Chest pain, 2004 (tolerates 50 mg prn)  . Verapamil Other (See Comments)    Causes pvc's    Medications: I have reviewed the patient's current medications.  Results for orders placed or performed during the hospital encounter of 07/04/15 (from the past 48 hour(s))  CBC     Status: Abnormal   Collection Time: 07/07/15  5:11 AM  Result Value  Ref Range   WBC 5.3 4.0 - 10.5 K/uL   RBC 4.05 (L) 4.22 - 5.81 MIL/uL   Hemoglobin 10.5 (L) 13.0 - 17.0 g/dL   HCT 34.8 (L) 39.0 - 52.0 %   MCV 85.9 78.0 - 100.0 fL   MCH 25.9 (L) 26.0 - 34.0 pg   MCHC 30.2 30.0 - 36.0 g/dL   RDW 16.9 (H) 11.5 - 15.5 %   Platelets 246 150 - 400 K/uL  Heparin level (unfractionated)     Status: Abnormal   Collection Time: 07/07/15  5:11 AM  Result Value Ref Range   Heparin Unfractionated 0.11 (L) 0.30 - 0.70 IU/mL    Comment:        IF HEPARIN RESULTS ARE BELOW EXPECTED VALUES, AND PATIENT DOSAGE HAS BEEN CONFIRMED, SUGGEST FOLLOW UP TESTING OF ANTITHROMBIN III LEVELS.   Basic metabolic panel     Status: Abnormal   Collection Time: 07/07/15  5:11 AM  Result Value Ref Range   Sodium 135 135 - 145 mmol/L   Potassium 4.2 3.5 - 5.1 mmol/L   Chloride 102 101 - 111 mmol/L   CO2 27 22 - 32 mmol/L   Glucose, Bld 106 (H) 65 - 99 mg/dL   BUN 15 6 - 20 mg/dL   Creatinine, Ser 0.82 0.61 - 1.24 mg/dL   Calcium 8.8 (L) 8.9 - 10.3 mg/dL   GFR calc non Af Amer >60 >60 mL/min   GFR calc Af Amer >60 >60 mL/min    Comment: (NOTE) The eGFR has been calculated using the CKD EPI equation. This calculation has not been validated in all clinical situations. eGFR's persistently <60 mL/min signify possible Chronic Kidney Disease.    Anion gap 6 5 - 15  Protime-INR     Status: Abnormal   Collection Time: 07/07/15  5:11 AM  Result Value Ref Range   Prothrombin Time 15.7 (H) 11.6 - 15.2 seconds   INR 1.24 0.00 - 1.49  CBC     Status: Abnormal   Collection Time: 07/08/15  6:44 AM  Result Value Ref Range   WBC 5.1 4.0 - 10.5 K/uL   RBC 3.75 (L) 4.22 - 5.81 MIL/uL   Hemoglobin 9.8 (L) 13.0 - 17.0 g/dL   HCT 32.5 (L) 39.0 - 52.0 %   MCV 86.7 78.0 - 100.0 fL   MCH 26.1 26.0 - 34.0 pg   MCHC 30.2 30.0 - 36.0 g/dL   RDW 16.8 (H) 11.5 - 15.5 %   Platelets 257 150 - 400 K/uL  Protime-INR     Status: None   Collection Time: 07/08/15  6:44 AM  Result Value Ref  Range   Prothrombin Time 15.0 11.6 - 15.2 seconds   INR 1.16 0.00 - 1.49    No results found.  Pertinent items are noted in HPI. Temp:  [  97.9 F (36.6 C)-98.3 F (36.8 C)] 97.9 F (36.6 C) (12/12 0500) Pulse Rate:  [67-86] 67 (12/12 1359) Resp:  [16-23] 23 (12/12 1359) BP: (107-133)/(67-80) 133/80 mmHg (12/12 1359) SpO2:  [98 %-100 %] 100 % (12/12 1359) General appearance: alert and cooperative Resp: clear to auscultation bilaterally Cardio: regular rate and rhythm L wrist:  warm to touch, no erythema, no draining sinus tract, limited wrist and finger motion, pain with wrist motion; fingers warm and pink, palp radial pulse;   MRI: poor quality but as previous MRI shows changes c/w osteo of distal radius, distal ulna, carpal bones, with associated synovitis  Assessment: L wrist Osteo  Plan: OR for deep tissue culture, debridement; discussed plan with ID,per ID pt will need to stay in hospital after surgery; Spoke at length again to patient that ultimate goal is to clear infection; unable to reconstruct until no infection present.  This process will take time and require more than one surgery.     Jaicob Dia CHRISTOPHER 07/08/2015, 4:11 PM

## 2015-07-08 NOTE — Progress Notes (Signed)
BP 101/67 HR 67 pt. scheduled to receive 75mg  of lopressor which RN held & text paged Craige CottaKirby, NP.  Will await for return call back and continue to monitor.  Forbes Cellarelcine Caspar Favila, RN

## 2015-07-08 NOTE — Progress Notes (Signed)
[facility] PROGRESS NOTE  [name] GEX:[id number] DOB: 29-Mar-1956 DOA: 07/04/2015 PCP: [name], MD  Assessment/Plan: 1. Osteomyelitis of Wrist -[name] is a 59 year old gentleman having history of left wrist abscess status post incision and drainage, found to have evidence of osteomyelitis/septic joint involving left wrist from MRI dated 05/11/2015. -He has been treated with IN ancef in the outpatient setting.  -There was concern from his infectious disease specialist regarding progression of infection for which admission was recommended for hand surgery consultation. MRI of left wrist performed on 07/05/2015 revealing findings concerning again for septic arthritis and osteomyelitis involving carpal bones and bases of the metacarpals. -Today I discussed case with his hand surgeon [name] informing him of last night's MRI findings who recommended further treatment with IV antibiotic therapy and follow up at his office this Monday.  -Case discussed with Dr Ninetta Lights of Infectious Disease who recommended obtaining a second opinion from Hand Surgery. Per Dr Algis Liming a second opinion was sought from Dr Amanda Pea who declined to see patient since he was already being seen by Dr [name].  -Patient appears nontoxic, hemodynamically stable for the time being.  -We are awaiting a second opinion from Hand Surgery. Considering transfer to tertiary medical center as well.  -Antimicrobial therapy has been held in the hopes of surgical intervention in the near future for increasing yield of cultures.  -Plan for surgical intervention on 07/08/2015 for debridement. Procedure to be performed by Dr [name].   2. 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 of orthopedic surgery on 05/17/2015 -Surgical wound appears to be healing well, no evidence of active infection.  -Wound care consulted   3.  Right femoral abscess/right  femoral aneurysm -Status post excision of ruptured right mycotic SFA, with right thigh incision and drainage of abscess -Surgical incision site appears clean, no evidence of active infection  4.  History of DVT of right lower extremity -Coumadin was held given the possibility of surgery. -INR now subtherapeutic, pharmacy consulted for IV heparin dosing.   Code Status: Full Code Family Communication: Spoke to his wife  Disposition Plan: Plan for surgical intervention in am   Consultants:  Infectious disease  Antibiotics:  Ancef  HPI/Subjective: [name] is a pleasant 59 year old gentleman with a past medical history of hypertension, dyslipidemia, peripheral last her disease, having a history of MSSA bacteremia, right femoral abscess, right femoral aneurysm (S/p excision of ruptured R mycotic SFA) , left hand abscess (S/p I&D)  for which she was hospitalized in September 2016. He was eventually discharged on Ancef  for a total of 6 weeks. Patient was transferred to acute rehabilitation however came back to the hospital about a week later with fever. For the workup of left hand with MRI revealed findings concerning for osteomyelitis. Patient was also found to have gangrene of right great toe and second toe, undergoing first and second ray amputation, procedure performed by [name] of Orthopedic Surgery. Craig. [name] was discharged on an additional 8 weeks of IV Ancef.  Concern for persistent left wrist pain associate with swelling consistent with further progression of infection. MRI of left hand performed on 07/05/2015 showing findings concerning for septic arthritis and osteomyelitis   Objective: Filed Vitals:   07/08/15 0500 07/08/15 1359  BP: 107/79 133/80  Pulse: 76 67  Temp: 97.9 F (36.6 C)   Resp: 16 23    Intake/Output Summary (Last 24 hours)  at 07/08/15 1802 Last data filed at 07/08/15 1630  Gross per 24 hour  Intake    720 ml  Output   1075 ml  Net   -355 ml   Filed  Weights   07/04/15 2035  Weight: 99.701 kg (219 lb 12.8 oz)    Exam:   General:  Patient is awake and alert, nontoxic-appearing  Cardiovascular: Regular rate and rhythm normal S1-S2  Respiratory: Normal respiratory effort, lungs are clear to auscultation bilaterally  Abdomen: Soft nontender nondistended positive bowel sounds  Musculoskeletal: There is swelling surrounding left wrist, having some associated pain with passive and active movement There was mild erythema. I did not palpate fluctuant masses or notice purulence. On his right foot status post first and second ray amputations, no evidence of active infection.  Data Reviewed: Basic Metabolic Panel:  Recent Labs Lab 07/05/15 0140 07/07/15 0511  NA 135 135  K 3.9 4.2  CL 104 102  CO2 22 27  GLUCOSE 110* 106*  BUN 14 15  CREATININE 0.77 0.82  CALCIUM 9.1 8.8*  MG 1.9  --    Liver Function Tests:  Recent Labs Lab 07/05/15 0140  AST 20  ALT 10*  ALKPHOS 96  BILITOT 0.3  PROT 7.9  ALBUMIN 3.6   No results for input(s): LIPASE, AMYLASE in the last 168 hours. No results for input(s): AMMONIA in the last 168 hours. CBC:  Recent Labs Lab 07/05/15 0140 07/06/15 0521 07/07/15 0511 07/08/15 0644  WBC 11.0* 5.3 5.3 5.1  NEUTROABS 6.1  --   --   --   HGB 11.3* 10.0* 10.5* 9.8*  HCT 37.5* 32.9* 34.8* 32.5*  MCV 85.2 85.7 85.9 86.7  PLT 318 225 246 257   Cardiac Enzymes:  Recent Labs Lab 07/05/15 0140  TROPONINI <0.03   BNP (last 3 results)  Recent Labs  07/05/15 0140  BNP 20.4    ProBNP (last 3 results) No results for input(s): PROBNP in the last 8760 hours.  CBG: No results for input(s): GLUCAP in the last 168 hours.  Recent Results (from the past 240 hour(s))  Culture, blood (routine x 2)     Status: None (Preliminary result)   Collection Time: 07/05/15  1:40 AM  Result Value Ref Range Status   Specimen Description BLOOD BLOOD LEFT FOREARM  Final   Special Requests BOTTLES DRAWN  AEROBIC ONLY 5CC  Final   Culture NO GROWTH 3 DAYS  Final   Report Status PENDING  Incomplete  Culture, blood (routine x 2)     Status: None (Preliminary result)   Collection Time: 07/05/15  1:45 AM  Result Value Ref Range Status   Specimen Description BLOOD LEFT HAND  Final   Special Requests IN PEDIATRIC BOTTLE 3CC  Final   Culture NO GROWTH 3 DAYS  Final   Report Status PENDING  Incomplete     Studies: No results found.  Scheduled Meds: . atorvastatin  80 mg Oral Daily  . docusate sodium  100 mg Oral BID  . DULoxetine  30 mg Oral Daily  . enoxaparin (LOVENOX) injection  100 mg Subcutaneous Q12H  . flecainide  100 mg Oral Q12H  . gabapentin  100 mg Oral TID  . hydrocerin   Topical Daily  . metoprolol tartrate  75 mg Oral BID  . morphine  90 mg Oral Q12H  . multivitamin with minerals  1 tablet Oral Daily  . naloxegol oxalate  25 mg Oral QAC breakfast  . polyethylene glycol  17 g Oral Daily  . sodium chloride  10 mL Intravenous Q12H  . sodium chloride  10 mL Intravenous Q12H  . temazepam  15-30 mg Oral QHS  . venlafaxine XR  75 mg Oral Q breakfast   Continuous Infusions:    Principal Problem:   Acute osteomyelitis of left ulna (HCC) Active Problems:   Chronic pain syndrome   Cellulitis of left hand   Arm pain, anterior   Cellulitis of finger of left hand   Cellulitis of left upper extremity   Abscess of left hand   Osteomyelitis (HCC)   Paroxysmal atrial fibrillation (HCC)    Time spent: 15 min    Jeralyn Bennett  [facility] Pager [phone]. If 7PM-7AM, please contact night-coverage at [email or web address], password [facility] 07/08/2015, 6:02 PM  LOS: 4 days

## 2015-07-08 NOTE — Care Management Note (Signed)
Case Management Note  Patient Details  Name: [name] MRN: [id number] Date of Birth: [birth year]  Subjective/Objective:    Date:07/08/15 Spoke with patient at the bedside.  Introduced self as Sports coachcase manager and explained role in discharge planning and how to be reached.  Verified patient lives in town,  with spouse. Has DME rolling walker, manuel wheelchair and bsc.  Expressed potential need for no other DME.  Verified patient anticipates to go home with family,  at time of discharge and will have full-time  supervision by family at this time to best of their knowledge. Patient  denied needing help with their medication.  Patient  is driven by spouse to MD appointments.  Verified patient has PCP Patria ManeMike Gassemi..   Plan: CM will continue to follow for [facility] planning [facility].                 Action/Plan:   Expected Discharge Date:                  Expected Discharge Plan:  Home/Self Care  In-House Referral:     Discharge planning Services  CM Consult  Post Acute Care Choice:    Choice offered to:     DME Arranged:    DME Agency:     HH Arranged:    HH Agency:     Status of Service:  Completed, signed off  Medicare Important Message Given:    Date Medicare IM Given:    Medicare IM give by:    Date Additional Medicare IM Given:    Additional Medicare Important Message give by:     If discussed at Long Length of Stay Meetings, dates discussed:    Additional Comments:  [name], Craig Lund Clinton, RN 07/08/2015, 4:55 PM

## 2015-07-09 ENCOUNTER — Inpatient Hospital Stay (HOSPITAL_COMMUNITY): Payer: Medicaid Other | Admitting: Anesthesiology

## 2015-07-09 ENCOUNTER — Encounter (HOSPITAL_COMMUNITY)
Admission: AD | Disposition: A | Discharge status: Home Health | Payer: Self-pay | Source: Ambulatory Visit | Attending: Internal Medicine

## 2015-07-09 ENCOUNTER — Encounter (HOSPITAL_COMMUNITY): Payer: Self-pay | Admitting: Anesthesiology

## 2015-07-09 HISTORY — PX: I&D EXTREMITY: SHX5045

## 2015-07-09 SURGERY — IRRIGATION AND DEBRIDEMENT EXTREMITY
Anesthesia: General | Site: Arm Lower | Laterality: Left

## 2015-07-09 MED ORDER — SODIUM CHLORIDE 0.9 % IR SOLN
Status: DC | PRN
  Administered 2015-07-09: 1000 mL
  Administered 2015-07-09: 3000 mL

## 2015-07-09 MED ORDER — SODIUM CHLORIDE 0.9 % IV SOLN
10.0000 mg | INTRAVENOUS | Status: DC | PRN
  Administered 2015-07-09: 50 ug/min via INTRAVENOUS

## 2015-07-09 MED ORDER — HYDROMORPHONE HCL 1 MG/ML IJ SOLN: 0.2500 mg | INTRAMUSCULAR | Status: DC | PRN

## 2015-07-09 MED ORDER — MIDAZOLAM HCL 2 MG/2ML IJ SOLN
INTRAMUSCULAR | Status: AC
  Administered 2015-07-09: 2 mg
  Filled 2015-07-09: qty 2

## 2015-07-09 MED ORDER — ROPIVACAINE HCL 5 MG/ML IJ SOLN
INTRAMUSCULAR | Status: DC | PRN
  Administered 2015-07-09: 150 mg via PERINEURAL

## 2015-07-09 MED ORDER — EPHEDRINE SULFATE 50 MG/ML IJ SOLN
INTRAMUSCULAR | Status: DC | PRN
  Administered 2015-07-09: 10 mg via INTRAVENOUS

## 2015-07-09 MED ORDER — MORPHINE SULFATE (PF) 2 MG/ML IV SOLN
2.0000 mg | INTRAVENOUS | Status: DC | PRN
  Administered 2015-07-09 – 2015-07-11 (×8): 2 mg via INTRAVENOUS
  Filled 2015-07-09 (×8): qty 1

## 2015-07-09 MED ORDER — MIDAZOLAM HCL 2 MG/2ML IJ SOLN
INTRAMUSCULAR | Status: AC
Start: 2015-07-09 — End: 2015-07-09
  Filled 2015-07-09: qty 2

## 2015-07-09 MED ORDER — LIDOCAINE HCL (CARDIAC) 20 MG/ML IV SOLN
INTRAVENOUS | Status: AC
  Filled 2015-07-09: qty 5

## 2015-07-09 MED ORDER — MIDAZOLAM HCL 5 MG/5ML IJ SOLN
INTRAMUSCULAR | Status: DC | PRN
  Administered 2015-07-09: 1 mg via INTRAVENOUS

## 2015-07-09 MED ORDER — FENTANYL CITRATE (PF) 250 MCG/5ML IJ SOLN
INTRAMUSCULAR | Status: AC
  Filled 2015-07-09: qty 5

## 2015-07-09 MED ORDER — FENTANYL CITRATE (PF) 100 MCG/2ML IJ SOLN
INTRAMUSCULAR | Status: AC
  Administered 2015-07-09: 100 ug
  Filled 2015-07-09: qty 2

## 2015-07-09 MED ORDER — BUPIVACAINE HCL (PF) 0.25 % IJ SOLN
INTRAMUSCULAR | Status: AC
Start: 2015-07-09 — End: 2015-07-09
  Filled 2015-07-09: qty 30

## 2015-07-09 MED ORDER — PROMETHAZINE HCL 25 MG/ML IJ SOLN: 6.2500 mg | INTRAMUSCULAR | Status: DC | PRN

## 2015-07-09 MED ORDER — LACTATED RINGERS IV SOLN
INTRAVENOUS | Status: DC | PRN
  Administered 2015-07-09: 14:00:00 via INTRAVENOUS

## 2015-07-09 MED ORDER — ONDANSETRON HCL 4 MG/2ML IJ SOLN
INTRAMUSCULAR | Status: AC
  Filled 2015-07-09: qty 2

## 2015-07-09 MED ORDER — PROPOFOL 10 MG/ML IV BOLUS
INTRAVENOUS | Status: DC | PRN
  Administered 2015-07-09: 140 mg via INTRAVENOUS

## 2015-07-09 MED ORDER — PHENYLEPHRINE HCL 10 MG/ML IJ SOLN
INTRAMUSCULAR | Status: DC | PRN
  Administered 2015-07-09: 80 ug via INTRAVENOUS
  Administered 2015-07-09 (×2): 120 ug via INTRAVENOUS
  Administered 2015-07-09: 80 ug via INTRAVENOUS

## 2015-07-09 MED ORDER — ALBUMIN HUMAN 5 % IV SOLN
INTRAVENOUS | Status: AC
  Filled 2015-07-09: qty 250

## 2015-07-09 SURGICAL SUPPLY — 50 items
BAG DECANTER FOR FLEXI CONT (MISCELLANEOUS) ×3 IMPLANT
BANDAGE ELASTIC 3 VELCRO ST LF (GAUZE/BANDAGES/DRESSINGS) ×2 IMPLANT
BANDAGE ELASTIC 4 VELCRO ST LF (GAUZE/BANDAGES/DRESSINGS) IMPLANT
BNDG GAUZE ELAST 4 BULKY (GAUZE/BANDAGES/DRESSINGS) IMPLANT
CONT SPEC 4OZ CLIKSEAL STRL BL (MISCELLANEOUS) ×2 IMPLANT
CORDS BIPOLAR (ELECTRODE) ×2 IMPLANT
COVER SURGICAL LIGHT HANDLE (MISCELLANEOUS) ×2 IMPLANT
CUFF TOURNIQUET SINGLE 18IN (TOURNIQUET CUFF) ×2 IMPLANT
DRAPE SURG 17X23 STRL (DRAPES) ×3 IMPLANT
ELECT REM PT RETURN 9FT ADLT (ELECTROSURGICAL) ×3
ELECTRODE REM PT RTRN 9FT ADLT (ELECTROSURGICAL) IMPLANT
GAUZE PACKING IODOFORM 1/4X5 (PACKING) IMPLANT
GAUZE SPONGE 4X4 12PLY STRL (GAUZE/BANDAGES/DRESSINGS) ×1 IMPLANT
GAUZE XEROFORM 1X8 LF (GAUZE/BANDAGES/DRESSINGS) ×3 IMPLANT
GLOVE BIO SURGEON STRL SZ 6.5 (GLOVE) ×2 IMPLANT
GLOVE BIO SURGEONS STRL SZ 6.5 (GLOVE) ×1
GLOVE BIOGEL M STRL SZ7.5 (GLOVE) ×3 IMPLANT
GLOVE BIOGEL PI IND STRL 6.5 (GLOVE) IMPLANT
GLOVE BIOGEL PI INDICATOR 6.5 (GLOVE) ×2
GOWN STRL REUS W/ TWL LRG LVL3 (GOWN DISPOSABLE) ×2 IMPLANT
GOWN STRL REUS W/TWL LRG LVL3 (GOWN DISPOSABLE) ×6
HANDPIECE INTERPULSE COAX TIP (DISPOSABLE)
KIT BASIN OR (CUSTOM PROCEDURE TRAY) ×3 IMPLANT
KIT ROOM TURNOVER OR (KITS) ×3 IMPLANT
MANIFOLD NEPTUNE II (INSTRUMENTS) ×3 IMPLANT
NEEDLE HYPO 25GX1X1/2 BEV (NEEDLE) IMPLANT
NS IRRIG 1000ML POUR BTL (IV SOLUTION) ×3 IMPLANT
PACK ORTHO EXTREMITY (CUSTOM PROCEDURE TRAY) ×3 IMPLANT
PAD ARMBOARD 7.5X6 YLW CONV (MISCELLANEOUS) ×6 IMPLANT
PAD CAST 4YDX4 CTTN HI CHSV (CAST SUPPLIES) ×1 IMPLANT
PADDING CAST COTTON 4X4 STRL (CAST SUPPLIES) ×3
SET CYSTO W/LG BORE CLAMP LF (SET/KITS/TRAYS/PACK) ×2 IMPLANT
SET HNDPC FAN SPRY TIP SCT (DISPOSABLE) IMPLANT
SOAP 2 % CHG 4 OZ (WOUND CARE) ×3 IMPLANT
SPLINT FIBERGLASS 3X12 (CAST SUPPLIES) ×3 IMPLANT
SPONGE GAUZE 4X4 12PLY STER LF (GAUZE/BANDAGES/DRESSINGS) ×2 IMPLANT
SPONGE LAP 18X18 X RAY DECT (DISPOSABLE) IMPLANT
SPONGE LAP 4X18 X RAY DECT (DISPOSABLE) ×3 IMPLANT
SUT ETHILON 4 0 PS 2 18 (SUTURE) ×4 IMPLANT
SUT VIC AB 3-0 SH 27 (SUTURE) ×3
SUT VIC AB 3-0 SH 27X BRD (SUTURE) IMPLANT
SWAB CULTURE ESWAB REG 1ML (MISCELLANEOUS) ×3 IMPLANT
SYR CONTROL 10ML LL (SYRINGE) IMPLANT
TOWEL OR 17X24 6PK STRL BLUE (TOWEL DISPOSABLE) ×3 IMPLANT
TOWEL OR 17X26 10 PK STRL BLUE (TOWEL DISPOSABLE) ×3 IMPLANT
TUBE ANAEROBIC SPECIMEN COL (MISCELLANEOUS) ×2 IMPLANT
TUBE CONNECTING 12'X1/4 (SUCTIONS) ×1
TUBE CONNECTING 12X1/4 (SUCTIONS) ×2 IMPLANT
WATER STERILE IRR 1000ML POUR (IV SOLUTION) ×1 IMPLANT
YANKAUER SUCT BULB TIP NO VENT (SUCTIONS) ×3 IMPLANT

## 2015-07-09 NOTE — Progress Notes (Signed)
Pt seen in Pre-op, examined, all questions answered regarding surgery.  Will proceed with incision, drainage and obtaining cultures as planned. Consent signed and witnessed.

## 2015-07-09 NOTE — Transfer of Care (Signed)
Immediate Anesthesia Transfer of Care Note  Patient: Craig MunchJimmy F [name]  Procedure(s) Performed: Procedure(s): IRRIGATION AND DEBRIDEMENT LEFT WRIST (Left)  Patient Location: PACU  Anesthesia Type:General  Level of Consciousness: awake, alert  and patient cooperative  Airway & Oxygen Therapy: Patient Spontanous Breathing and Patient connected to nasal cannula oxygen  Post-op Assessment: Report given to RN, Post -op Vital signs reviewed and stable and Patient moving all extremities  Post vital signs: Reviewed and stable  Last Vitals:  Filed Vitals:   07/09/15 0552 07/09/15 0936  BP: 104/91 108/63  Pulse: 76 63  Temp: 36.8 C   Resp: 18     Complications: No apparent anesthesia complications

## 2015-07-09 NOTE — Anesthesia Procedure Notes (Addendum)
Anesthesia Regional Block:  Supraclavicular block  Pre-Anesthetic Checklist: ,, timeout performed, Correct Patient, Correct Site, Correct Laterality, Correct Procedure, Correct Position, site marked, Risks and benefits discussed,  Surgical consent,  Pre-op evaluation,  At surgeon's request and post-op pain management  Laterality: Left  Prep: chloraprep       Needles:  Injection technique: Single-shot  Needle Type: Echogenic Stimulator Needle     Needle Length: 5cm 5 cm Needle Gauge: 22 and 22 G    Additional Needles:  Procedures: ultrasound guided (picture in chart) and nerve stimulator Supraclavicular block  Nerve Stimulator or Paresthesia:  Response: bicep contraction, 0.45 mA,   Additional Responses:   Narrative:  Start time: 07/09/2015 2:07 PM End time: 07/09/2015 2:17 PM Injection made incrementally with aspirations every 5 mL.  Performed by: Personally  Anesthesiologist: Heather RobertsSINGER, JAMES  Additional Notes: Functioning IV was confirmed and monitors applied.  A 50mm 22ga echogenic arrow stimulator was used. Sterile prep and drape,hand hygiene and sterile gloves were used.Ultrasound guidance: relevant anatomy identified, needle position confirmed, local anesthetic spread visualized around nerve(s)., vascular puncture avoided.  Image printed for medical record.  Negative aspiration and negative test dose prior to incremental administration of local anesthetic. The patient tolerated the procedure well.   Procedure Name: LMA Insertion Date/Time: 07/09/2015 2:30 PM Performed by: Sharlene DoryWALKER, Janavia Rottman E Pre-anesthesia Checklist: Patient identified, Emergency Drugs available, Suction available, Patient being monitored and Timeout performed Patient Re-evaluated:Patient Re-evaluated prior to inductionOxygen Delivery Method: Circle system utilized Preoxygenation: Pre-oxygenation with 100% oxygen Intubation Type: IV induction Ventilation: Mask ventilation without difficulty LMA: LMA  inserted LMA Size: 4.0 Number of attempts: 1 Placement Confirmation: positive ETCO2 and breath sounds checked- equal and bilateral Tube secured with: Tape Dental Injury: Teeth and Oropharynx as per pre-operative assessment

## 2015-07-09 NOTE — Progress Notes (Signed)
Lt arm "Knumb" ace wrap CDI. Warm to touch.Elevated on 2 pillows

## 2015-07-09 NOTE — Anesthesia Preprocedure Evaluation (Signed)
Anesthesia Evaluation  Patient identified by MRN, date of birth, ID band Patient awake    Reviewed: Allergy & Precautions, NPO status , Patient's Chart, lab work & pertinent test results  Airway        Dental   Pulmonary neg pulmonary ROS, former smoker,           Cardiovascular hypertension, + Peripheral Vascular Disease  negative cardio ROS       Neuro/Psych PSYCHIATRIC DISORDERS Anxiety negative neurological ROS  negative psych ROS   GI/Hepatic Neg liver ROS, PUD, GERD  ,  Endo/Other  negative endocrine ROS  Renal/GU negative Renal ROS  negative genitourinary   Musculoskeletal negative musculoskeletal ROS (+)   Abdominal   Peds negative pediatric ROS (+)  Hematology negative hematology ROS (+)   Anesthesia Other Findings   Reproductive/Obstetrics negative OB ROS                             Anesthesia Physical Anesthesia Plan  ASA: III  Anesthesia Plan: General   Post-op Pain Management:    Induction: Intravenous  Airway Management Planned: LMA  Additional Equipment:   Intra-op Plan:   Post-operative Plan: Extubation in OR  Informed Consent:   Plan Discussed with:   Anesthesia Plan Comments:         Anesthesia Quick Evaluation

## 2015-07-09 NOTE — Progress Notes (Signed)
[facility] PROGRESS NOTE  Gerhard MunchJimmy F Cumbie BTD:[id number] DOB: 1955-12-16 DOA: 07/04/2015 PCP: Patria ManeGASSEMI, MIKE, MD  Interim Summary [name] is a pleasant 59 year old gentleman with a past medical history of hypertension, dyslipidemia, peripheral last her disease, having a history of MSSA bacteremia, right femoral abscess, right femoral aneurysm (S/p excision of ruptured R mycotic SFA) , left hand abscess (S/p I&D)  for which she was hospitalized in September 2016. He was eventually discharged on Ancef  for a total of 6 weeks. Patient was transferred to acute rehabilitation however came back to the hospital about a week later with fever. For the workup of left hand with MRI revealed findings concerning for osteomyelitis. Patient was also found to have gangrene of right great toe and second toe, undergoing first and second ray amputation, procedure performed by Dr. Lajoyce Cornersuda of Orthopedic Surgery. [name] was discharged on an additional 8 weeks of IV Ancef.  Concern for persistent left wrist pain associate with swelling consistent with further progression of infection. MRI of left hand performed on 07/05/2015 showing findings concerning for septic arthritis and osteomyelitis. He was taken to the OR on 07/09/2015.   Assessment/Plan: 1. Osteomyelitis of Wrist -[name] is a 59 year old gentleman having history of left wrist abscess status post incision and drainage, found to have evidence of osteomyelitis/septic joint involving left wrist from MRI dated 05/11/2015. -He has been treated with IN ancef in the outpatient setting.  -There was concern from his infectious disease specialist regarding progression of infection for which admission was recommended for hand surgery consultation. MRI of left wrist performed on 07/05/2015 revealing findings concerning again for septic arthritis and osteomyelitis involving carpal bones and bases of the metacarpals. -Today I discussed case with his hand surgeon Dr. Izora Ribasoley informing  him of last night's MRI findings who recommended further treatment with IV antibiotic therapy and follow up at his office this Monday.  -Case discussed with Dr Ninetta LightsHatcher of Infectious Disease who recommended obtaining a second opinion from Hand Surgery. Per Dr Algis LimingVanDam a second opinion was sought from Dr Amanda PeaGramig who declined to see patient since he was already being seen by Dr Izora Ribasoley.  -Patient appears nontoxic, hemodynamically stable for the time being.  -We are awaiting a second opinion from Hand Surgery. Considering transfer to tertiary medical center as well.  -Antimicrobial therapy has been held in the hopes of surgical intervention in the near future for increasing yield of cultures.  -Plan for surgical intervention on 07/08/2015 for debridement. Procedure to be performed by Dr Izora Ribasoley.   2. 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 Lajoyce Cornersuda of orthopedic surgery on 05/17/2015 -Surgical wound appears to be healing well, no evidence of active infection.  -Wound care consulted   3.  Right femoral abscess/right femoral aneurysm -Status post excision of ruptured right mycotic SFA, with right thigh incision and drainage of abscess -Surgical incision site appears clean, no evidence of active infection  4.  History of DVT of right lower extremity -Coumadin was held given the possibility of surgery. -INR now subtherapeutic, pharmacy consulted for IV heparin dosing.   Code Status: Full Code Family Communication: Spoke to his wife  Disposition Plan: Plan for surgical intervention in am   Consultants:  Infectious disease  Antibiotics:  Ancef  HPI/Subjective: Patient states feeling the same, has no new complaints.   Objective: Filed Vitals:   07/09/15 1715 07/09/15 1730  BP:  85/61  Pulse:  77 75  Temp:    Resp: 11 11    Intake/Output Summary (Last 24 hours) at 07/09/15 1758 Last data filed at 07/09/15  1600  Gross per 24 hour  Intake    700 ml  Output   2435 ml  Net  -1735 ml   Filed Weights   07/04/15 2035  Weight: 99.701 kg (219 lb 12.8 oz)    Exam:   General:  Patient is awake and alert, nontoxic-appearing  Cardiovascular: Regular rate and rhythm normal S1-S2  Respiratory: Normal respiratory effort, lungs are clear to auscultation bilaterally  Abdomen: Soft nontender nondistended positive bowel sounds  Musculoskeletal: There is swelling surrounding left wrist, having some associated pain with passive and active movement There was mild erythema. I did not palpate fluctuant masses or notice purulence. On his right foot status post first and second ray amputations, no evidence of active infection.  Data Reviewed: Basic Metabolic Panel:  Recent Labs Lab 07/05/15 0140 07/07/15 0511  NA 135 135  K 3.9 4.2  CL 104 102  CO2 22 27  GLUCOSE 110* 106*  BUN 14 15  CREATININE 0.77 0.82  CALCIUM 9.1 8.8*  MG 1.9  --    Liver Function Tests:  Recent Labs Lab 07/05/15 0140  AST 20  ALT 10*  ALKPHOS 96  BILITOT 0.3  PROT 7.9  ALBUMIN 3.6   No results for input(s): LIPASE, AMYLASE in the last 168 hours. No results for input(s): AMMONIA in the last 168 hours. CBC:  Recent Labs Lab 07/05/15 0140 07/06/15 0521 07/07/15 0511 07/08/15 0644  WBC 11.0* 5.3 5.3 5.1  NEUTROABS 6.1  --   --   --   HGB 11.3* 10.0* 10.5* 9.8*  HCT 37.5* 32.9* 34.8* 32.5*  MCV 85.2 85.7 85.9 86.7  PLT 318 225 246 257   Cardiac Enzymes:  Recent Labs Lab 07/05/15 0140  TROPONINI <0.03   BNP (last 3 results)  Recent Labs  07/05/15 0140  BNP 20.4    ProBNP (last 3 results) No results for input(s): PROBNP in the last 8760 hours.  CBG: No results for input(s): GLUCAP in the last 168 hours.  Recent Results (from the past 240 hour(s))  Culture, blood (routine x 2)     Status: None (Preliminary result)   Collection Time: 07/05/15  1:40 AM  Result Value Ref Range Status    Specimen Description BLOOD BLOOD LEFT FOREARM  Final   Special Requests BOTTLES DRAWN AEROBIC ONLY 5CC  Final   Culture NO GROWTH 4 DAYS  Final   Report Status PENDING  Incomplete  Culture, blood (routine x 2)     Status: None (Preliminary result)   Collection Time: 07/05/15  1:45 AM  Result Value Ref Range Status   Specimen Description BLOOD LEFT HAND  Final   Special Requests IN PEDIATRIC BOTTLE 3CC  Final   Culture NO GROWTH 4 DAYS  Final   Report Status PENDING  Incomplete  Surgical pcr screen     Status: None   Collection Time: 07/08/15  5:27 PM  Result Value Ref Range Status   MRSA, PCR NEGATIVE NEGATIVE Final   Staphylococcus aureus NEGATIVE NEGATIVE Final    Comment:        The Xpert SA Assay (FDA approved for NASAL specimens in patients over 46 years of age), is one component of a comprehensive surveillance program.  Test performance has been validated by [facility] for patients greater than or equal to 57 year old. It  is not intended to diagnose infection nor to guide or monitor treatment.      Studies: No results found.  Scheduled Meds: . albumin human      . [MAR Hold] atorvastatin  80 mg Oral Daily  . [MAR Hold] docusate sodium  100 mg Oral BID  . [MAR Hold] DULoxetine  30 mg Oral Daily  . [MAR Hold] enoxaparin (LOVENOX) injection  100 mg Subcutaneous Q12H  . [MAR Hold] flecainide  100 mg Oral Q12H  . [MAR Hold] gabapentin  100 mg Oral TID  . [MAR Hold] hydrocerin   Topical Daily  . [MAR Hold] metoprolol tartrate  75 mg Oral BID  . [MAR Hold] morphine  90 mg Oral Q12H  . [MAR Hold] multivitamin with minerals  1 tablet Oral Daily  . [MAR Hold] naloxegol oxalate  25 mg Oral QAC breakfast  . [MAR Hold] polyethylene glycol  17 g Oral Daily  . [MAR Hold] sodium chloride  10 mL Intravenous Q12H  . [MAR Hold] sodium chloride  10 mL Intravenous Q12H  . [MAR Hold] temazepam  15-30 mg Oral QHS  . [MAR Hold] venlafaxine XR  75 mg Oral Q breakfast   Continuous  Infusions:    Principal Problem:   Acute osteomyelitis of left ulna (HCC) Active Problems:   Chronic pain syndrome   Cellulitis of left hand   Arm pain, anterior   Cellulitis of finger of left hand   Cellulitis of left upper extremity   Abscess of left hand   Osteomyelitis (HCC)   Paroxysmal atrial fibrillation (HCC)    Time spent: 15 min    Jeralyn Bennett  [facility] Pager [phone]. If 7PM-7AM, please contact night-coverage at [email or web address], password [facility] 07/09/2015, 5:58 PM  LOS: 5 days

## 2015-07-09 NOTE — Progress Notes (Signed)
INFECTIOUS DISEASE PROGRESS NOTE  ID: [name] is a 59 y.o. male with  Principal Problem:   Acute osteomyelitis of left ulna (HCC) Active Problems:   Chronic pain syndrome   Cellulitis of left hand   Arm pain, anterior   Cellulitis of finger of left hand   Cellulitis of left upper extremity   Abscess of left hand   Osteomyelitis (HCC)   Paroxysmal atrial fibrillation (HCC)  Subjective: Without complaints, in room, waiting for surgery today  Abtx:  Anti-infectives    Start     Dose/Rate Route Frequency Ordered Stop   07/05/15 1500  ceFAZolin (ANCEF) IVPB 2 g/50 mL premix  Status:  Discontinued     2 g 100 mL/hr over 30 Minutes Intravenous 3 times per day 07/05/15 1431 07/06/15 1501   07/05/15 1000  valACYclovir (VALTREX) tablet 1,000 mg  Status:  Discontinued     1,000 mg Oral Daily 07/05/15 0013 07/05/15 0017      Medications:  Scheduled: . atorvastatin  80 mg Oral Daily  . docusate sodium  100 mg Oral BID  . DULoxetine  30 mg Oral Daily  . enoxaparin (LOVENOX) injection  100 mg Subcutaneous Q12H  . flecainide  100 mg Oral Q12H  . gabapentin  100 mg Oral TID  . hydrocerin   Topical Daily  . metoprolol tartrate  75 mg Oral BID  . morphine  90 mg Oral Q12H  . multivitamin with minerals  1 tablet Oral Daily  . naloxegol oxalate  25 mg Oral QAC breakfast  . polyethylene glycol  17 g Oral Daily  . sodium chloride  10 mL Intravenous Q12H  . sodium chloride  10 mL Intravenous Q12H  . temazepam  15-30 mg Oral QHS  . venlafaxine XR  75 mg Oral Q breakfast    Objective: Vital signs in last 24 hours: Temp:  [97.6 F (36.4 C)-98.3 F (36.8 C)] 98.3 F (36.8 C) (12/13 0552) Pulse Rate:  [63-76] 63 (12/13 0936) Resp:  [18-23] 18 (12/13 0552) BP: (101-133)/(62-91) 108/63 mmHg (12/13 0936) SpO2:  [98 %-100 %] 98 % (12/13 0552)   General appearance: alert, cooperative and no distress  L wrist unchanged.   Lab Results  Recent Labs  07/07/15 0511 07/08/15 0644   WBC 5.3 5.1  HGB 10.5* 9.8*  HCT 34.8* 32.5*  NA 135  --   K 4.2  --   CL 102  --   CO2 27  --   BUN 15  --   CREATININE 0.82  --    Liver Panel No results for input(s): PROT, ALBUMIN, AST, ALT, ALKPHOS, BILITOT, BILIDIR, IBILI in the last 72 hours. Sedimentation Rate No results for input(s): ESRSEDRATE in the last 72 hours. C-Reactive Protein No results for input(s): CRP in the last 72 hours.  Microbiology: Recent Results (from the past 240 hour(s))  Culture, blood (routine x 2)     Status: None (Preliminary result)   Collection Time: 07/05/15  1:40 AM  Result Value Ref Range Status   Specimen Description BLOOD BLOOD LEFT FOREARM  Final   Special Requests BOTTLES DRAWN AEROBIC ONLY 5CC  Final   Culture NO GROWTH 3 DAYS  Final   Report Status PENDING  Incomplete  Culture, blood (routine x 2)     Status: None (Preliminary result)   Collection Time: 07/05/15  1:45 AM  Result Value Ref Range Status   Specimen Description BLOOD LEFT HAND  Final   Special Requests IN  PEDIATRIC BOTTLE 3CC  Final   Culture NO GROWTH 3 DAYS  Final   Report Status PENDING  Incomplete  Surgical pcr screen     Status: None   Collection Time: 07/08/15  5:27 PM  Result Value Ref Range Status   MRSA, PCR NEGATIVE NEGATIVE Final   Staphylococcus aureus NEGATIVE NEGATIVE Final    Comment:        The Xpert SA Assay (FDA approved for NASAL specimens in patients over 57 years of age), is one component of a comprehensive surveillance program.  Test performance has been validated by [facility] for patients greater than or equal to 829 year old. It is not intended to diagnose infection nor to guide or monitor treatment.     Studies/Results: No results found.   Assessment/Plan: Osteomyelitis of L wrist Prev mssa sepsis R foot wounds  Total days of antibiotics: off anbx  Will continue to watch off anbx Await surgery, deep Cx          Craig SaxJeffrey [name] Infectious Diseases (pager)  [phone] [email or web address] 07/09/2015, 12:14 PM  LOS: 5 days

## 2015-07-10 ENCOUNTER — Encounter (HOSPITAL_COMMUNITY): Payer: Self-pay | Admitting: General Surgery

## 2015-07-10 DIAGNOSIS — L02512 Cutaneous abscess of left hand: Secondary | ICD-10-CM

## 2015-07-10 DIAGNOSIS — M86131 Other acute osteomyelitis, right radius and ulna: Secondary | ICD-10-CM | POA: Insufficient documentation

## 2015-07-10 LAB — CULTURE, BLOOD (ROUTINE X 2)
Culture: NO GROWTH
Culture: NO GROWTH

## 2015-07-10 LAB — CBC
HEMATOCRIT: 33.9 % — AB (ref 39.0–52.0)
Hemoglobin: 10 g/dL — ABNORMAL LOW (ref 13.0–17.0)
MCH: 25.6 pg — AB (ref 26.0–34.0)
MCHC: 29.5 g/dL — ABNORMAL LOW (ref 30.0–36.0)
MCV: 86.9 fL (ref 78.0–100.0)
Platelets: 292 10*3/uL (ref 150–400)
RBC: 3.9 MIL/uL — AB (ref 4.22–5.81)
RDW: 16.9 % — ABNORMAL HIGH (ref 11.5–15.5)
WBC: 7.1 10*3/uL (ref 4.0–10.5)

## 2015-07-10 LAB — BASIC METABOLIC PANEL
Anion gap: 7 (ref 5–15)
BUN: 11 mg/dL (ref 6–20)
CHLORIDE: 99 mmol/L — AB (ref 101–111)
CO2: 30 mmol/L (ref 22–32)
CREATININE: 0.85 mg/dL (ref 0.61–1.24)
Calcium: 8.9 mg/dL (ref 8.9–10.3)
GFR calc non Af Amer: >60 mL/min (ref >60)
Glucose, Bld: 114 mg/dL — ABNORMAL HIGH (ref 65–99)
POTASSIUM: 4.2 mmol/L (ref 3.5–5.1)
SODIUM: 136 mmol/L (ref 135–145)

## 2015-07-10 MED ORDER — CEFAZOLIN SODIUM-DEXTROSE 2-3 GM-% IV SOLR
2.0000 g | Freq: Three times a day (TID) | INTRAVENOUS | Status: DC
  Administered 2015-07-10 – 2015-07-11 (×4): 2 g via INTRAVENOUS
  Filled 2015-07-10 (×8): qty 50

## 2015-07-10 NOTE — Progress Notes (Signed)
ANTICOAGULATION CONSULT NOTE  Pharmacy Consult for Lovenox Indication: DVT bridge therapy while off coumadin  Allergies  Allergen Reactions  . Imitrex [Sumatriptan] Other (See Comments)    Chest pain, 2004 (tolerates 50 mg prn)  . Verapamil Other (See Comments)    Causes pvc's    Patient Measurements: Height: 6\' 3"  (190.5 cm) Weight: 219 lb 12.8 oz (99.701 kg) IBW/kg (Calculated) : 84.5  Vital Signs: Temp: 97.6 F (36.4 C) (12/14 0839) Temp Source: Oral (12/14 0839) BP: 163/101 mmHg (12/14 0840) Pulse Rate: 74 (12/14 0840)  Labs:  Recent Labs  07/08/15 0644 07/10/15 0418  HGB 9.8* 10.0*  HCT 32.5* 33.9*  PLT 257 292  LABPROT 15.0  --   INR 1.16  --   CREATININE  --  0.85    Estimated Creatinine Clearance: 114.6 mL/min (by C-G formula based on Cr of 0.85).   Medical History: Past Medical History  Diagnosis Date  . Hypertension   . Chronic knee pain   . Hyperlipemia   . PUD (peptic ulcer disease)   . Dysrhythmia   . Peripheral vascular disease (HCC)   . Anxiety   . GERD (gastroesophageal reflux disease)   . 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 Wound of right ankle 06/05/2015   Assessment:  59 y/o M w/ h/o DVT. Warfarin is on hold for surgery. . Pharmacy dosing Lovenox bridge therapy due to PAD and osteo making lab sticks difficult when previously on IV heparin drip. S/p OR 12/13 - per Dr Vanessa BarbaraZamora, multiple staged interventions planned, so not ready to transition back to coumadin.  CBC stable, no bleeding reported. Renal function stable.   Goal of Therapy:  Monitor platelets by anticoagulation protocol: Yes   Plan:  Continue Lovenox SQ 100 mg q12h CBC and BMET q72 hrs  Herby AbrahamMichelle T. Krystianna Soth, Pharm.D. [phone] 07/10/2015 8:56 AM

## 2015-07-10 NOTE — Progress Notes (Signed)
INFECTIOUS DISEASE PROGRESS NOTE  ID: [name] is a 59 y.o. male with  Principal Problem:   Acute osteomyelitis of left ulna (HCC) Active Problems:   Chronic pain syndrome   Cellulitis of left hand   Arm pain, anterior   Cellulitis of finger of left hand   Cellulitis of left upper extremity   Abscess of left hand   Osteomyelitis (HCC)   Paroxysmal atrial fibrillation (HCC)  Subjective: Without complaints  Abtx:  Anti-infectives    Start     Dose/Rate Route Frequency Ordered Stop   07/05/15 1500  ceFAZolin (ANCEF) IVPB 2 g/50 mL premix  Status:  Discontinued     2 g 100 mL/hr over 30 Minutes Intravenous 3 times per day 07/05/15 1431 07/06/15 1501   07/05/15 1000  valACYclovir (VALTREX) tablet 1,000 mg  Status:  Discontinued     1,000 mg Oral Daily 07/05/15 0013 07/05/15 0017      Medications:  Scheduled: . atorvastatin  80 mg Oral Daily  . docusate sodium  100 mg Oral BID  . DULoxetine  30 mg Oral Daily  . enoxaparin (LOVENOX) injection  100 mg Subcutaneous Q12H  . flecainide  100 mg Oral Q12H  . gabapentin  100 mg Oral TID  . hydrocerin   Topical Daily  . metoprolol tartrate  75 mg Oral BID  . morphine  90 mg Oral Q12H  . multivitamin with minerals  1 tablet Oral Daily  . naloxegol oxalate  25 mg Oral QAC breakfast  . polyethylene glycol  17 g Oral Daily  . sodium chloride  10 mL Intravenous Q12H  . sodium chloride  10 mL Intravenous Q12H  . temazepam  15-30 mg Oral QHS  . venlafaxine XR  75 mg Oral Q breakfast    Objective: Vital signs in last 24 hours: Temp:  [97.6 F (36.4 C)-98.7 F (37.1 C)] 98.6 F (37 C) (12/14 1413) Pulse Rate:  [66-83] 66 (12/14 1413) Resp:  [9-18] 18 (12/14 1413) BP: (76-167)/(54-106) 148/75 mmHg (12/14 1413) SpO2:  [96 %-100 %] 98 % (12/14 1413)   General appearance: alert, cooperative and no distress Extremities: L wrist is wrapped. decreased light touch in fingers.   Lab Results  Recent Labs  07/08/15 0644  07/10/15 0418  WBC 5.1 7.1  HGB 9.8* 10.0*  HCT 32.5* 33.9*  NA  --  136  K  --  4.2  CL  --  99*  CO2  --  30  BUN  --  11  CREATININE  --  0.85   Liver Panel No results for input(s): PROT, ALBUMIN, AST, ALT, ALKPHOS, BILITOT, BILIDIR, IBILI in the last 72 hours. Sedimentation Rate No results for input(s): ESRSEDRATE in the last 72 hours. C-Reactive Protein No results for input(s): CRP in the last 72 hours.  Microbiology: Recent Results (from the past 240 hour(s))  Culture, blood (routine x 2)     Status: None   Collection Time: 07/05/15  1:40 AM  Result Value Ref Range Status   Specimen Description BLOOD BLOOD LEFT FOREARM  Final   Special Requests BOTTLES DRAWN AEROBIC ONLY 5CC  Final   Culture NO GROWTH 5 DAYS  Final   Report Status 07/10/2015 FINAL  Final  Culture, blood (routine x 2)     Status: None   Collection Time: 07/05/15  1:45 AM  Result Value Ref Range Status   Specimen Description BLOOD LEFT HAND  Final   Special Requests IN PEDIATRIC BOTTLE 3CC  Final   Culture NO GROWTH 5 DAYS  Final   Report Status 07/10/2015 FINAL  Final  Surgical pcr screen     Status: None   Collection Time: 07/08/15  5:27 PM  Result Value Ref Range Status   MRSA, PCR NEGATIVE NEGATIVE Final   Staphylococcus aureus NEGATIVE NEGATIVE Final    Comment:        The Xpert SA Assay (FDA approved for NASAL specimens in patients over 80 years of age), is one component of a comprehensive surveillance program.  Test performance has been validated by [facility] for patients greater than or equal to 80 year old. It is not intended to diagnose infection nor to guide or monitor treatment.   Anaerobic culture     Status: None (Preliminary result)   Collection Time: 07/09/15  2:19 PM  Result Value Ref Range Status   Specimen Description TISSUE LEFT WRIST  Final   Special Requests SPEC A IN CUP  Final   Gram Stain   Final    RARE WBC PRESENT, PREDOMINANTLY MONONUCLEAR NO ORGANISMS  SEEN Performed at [facility]    Culture PENDING  Incomplete   Report Status PENDING  Incomplete  Fungus Culture with Smear     Status: None (Preliminary result)   Collection Time: 07/09/15  2:19 PM  Result Value Ref Range Status   Specimen Description TISSUE WRIST LEFT  Final   Special Requests SPEC A IN CUP  Final   Fungal Smear   Final    NO YEAST OR FUNGAL ELEMENTS SEEN Performed at [facility]    Culture   Final    CULTURE IN PROGRESS FOR FOUR WEEKS Performed at [facility]    Report Status PENDING  Incomplete  Tissue culture     Status: None (Preliminary result)   Collection Time: 07/09/15  2:19 PM  Result Value Ref Range Status   Specimen Description TISSUE WRIST LEFT  Final   Special Requests SPEC A IN CUP  Final   Gram Stain   Final    RARE WBC PRESENT, PREDOMINANTLY MONONUCLEAR NO ORGANISMS SEEN Performed at [facility]    Culture PENDING  Incomplete   Report Status PENDING  Incomplete  AFB culture with smear     Status: None (Preliminary result)   Collection Time: 07/09/15  2:19 PM  Result Value Ref Range Status   Specimen Description TISSUE WRIST LEFT  Final   Special Requests SPEC A IN CUP  Final   Acid Fast Smear   Final    NO ACID FAST BACILLI SEEN Performed at [facility]    Culture   Final    CULTURE WILL BE EXAMINED FOR 6 WEEKS BEFORE ISSUING A FINAL REPORT Performed at [facility]    Report Status PENDING  Incomplete  Body fluid culture     Status: None (Preliminary result)   Collection Time: 07/09/15  2:56 PM  Result Value Ref Range Status   Specimen Description FLUID  Final   Special Requests LEFT ULNA SWAB  Final   Gram Stain   Final    RARE WBC PRESENT, PREDOMINANTLY MONONUCLEAR NO ORGANISMS SEEN    Culture NO GROWTH < 24 HOURS  Final   Report Status PENDING  Incomplete  Wound culture     Status: None (Preliminary result)   Collection Time: 07/09/15  4:50 PM  Result Value Ref Range  Status   Specimen Description WOUND LEFT WRIST  Final  Special Requests SPEC B ON SWAB  Final   Gram Stain   Final    FEW WBC PRESENT, PREDOMINANTLY PMN NO SQUAMOUS EPITHELIAL CELLS SEEN NO ORGANISMS SEEN Performed at [facility]    Culture PENDING  Incomplete   Report Status PENDING  Incomplete    Studies/Results: No results found.   Assessment/Plan: Osteomyelitis of L wrist Prev mssa sepsis R foot wounds  Total days of antibiotics: 0  Will resume ancef, high dose.  Await his Cx.          Johny SaxJeffrey Dashonna Chagnon Infectious Diseases (pager) [phone] [email or web address] 07/10/2015, 4:25 PM  LOS: 6 days

## 2015-07-10 NOTE — Op Note (Signed)
[name], [name]                   ACCOUNT NO.:  [id number]  MEDICAL RECORD NO.:  [id number]  LOCATION:  [id number]                        FACILITY:  [facility]  PHYSICIAN:  Craig Abraham, MD    DATE OF BIRTH:  August 10, 1955  DATE OF PROCEDURE:  07/09/2015 DATE OF DISCHARGE:                              OPERATIVE REPORT   PREOPERATIVE DIAGNOSIS:  Osteomyelitis of the left wrist.  POSTOPERATIVE DIAGNOSIS:  Osteomyelitis of the left wrist.  PROCEDURE:  Incision and drainage, debridement, and deep culture of the left wrist.  ANESTHESIA:  General with an axillary block.  INDICATIONS:  [name] is a 59 year old gentleman well known to multiple specialties here in the hospital.  He presented a couple months ago with sepsis originating from the right groin.  He also had a fall and had some wrist hematoma, subsequently developed a seeding of the left wrist and developed abscess and subsequently osteomyelitis of the wrist including the distal radius or ulna, DRUJ, and carpal bones.  He has been placed on IV antibiotics for some time.  However, recent MRI suggested worsening of the appearance of the wrist and consensus was made that operative debridement deep tissue culture was necessary.  This was thoroughly discussed with the patient.  Consent was obtained.  PROCEDURE IN DETAIL:  Patient was taken to the operating room, placed supine on the operating table.  Time-out was performed identifying the correct extremity.  General anesthesia was administered without difficulty.  The left wrist was prepped and draped in the normal sterile fashion.  The arm was exsanguinated and the tourniquet was inflated in the upper arm to 250 mmHg.  An incision along the dorsal wrist was made.  Careful dissection was carried down to the proximal portion of the extensor retinaculum; it was taken down in a stepwise fashion.  Initially, the joint capsule between the distal radius and the scaphoid lunate interval was  entered.  There did not appear to be any gross purulence.  Once the joint capsule was entered, further probing of the wound, I unroofed a fluid collection that was somewhat turbid emanating from either the radiocarpal joint or in the DRUJ space.  This fluid was sent for culture.  A small curette was used in this interval to gently remove any loculated tissue.  The bone quality in this area felt adequate without any spongy bone or gross necrotic bone.  Next due to the MRI findings of some involvement overlying the distal ulna, the dissection was carried ulnarly over the ulnar head. There was some bulging tissue that was incised revealing some serous type fluid.  This fluid did not appear contaminated; however cultures were taken separately. Again, a curette was used in this area, in proximity to the DRUJ, were some thickened mucosal type tissue was debrided, excised, and sent for culture. Following, both of these sites were irrigated with total of 3 L of saline solution.  After, both of these areas were closed, by bringing some soft tissue, synovium together.    The extensor retinaculum was also repaired with 3-0 Vicryl.  Next, the subcutaneous layers were closed and the skin itself was closed with multiple 4-0 nylon  sutures.  The Patient tolerated procedure well, was taken to recovery room in stable condition.  Cultures sent, there were 2 aerobic cultures, one from the radial side of the wrist, the other was from the serous fluid obtained from the ulnar side of the wrist and there were soft tissue cultures sent for multiple microbiology specimens.     Craig AbrahamHarrill C Niobe Dick, MD     HCC/MEDQ  D:  07/09/2015  T:  07/10/2015  Job:  [id number]

## 2015-07-10 NOTE — Progress Notes (Signed)
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 He was eventually discharged on Ancef for a total of 6 weeks. Patient was transferred to acute rehabilitation however came back to the hospital about a week later with fever. For the workup of left hand with MRI revealed findings concerning for osteomyelitis. Patient was also found to have gangrene of right great toe and second toe, undergoing first and second ray amputation, procedure performed by [name] of Orthopedic Surgery. Craig. [name] was discharged on an additional 8 weeks of IV Ancef. Concern for persistent left wrist pain associate with swelling consistent with further progression of infection. MRI of left hand performed on 07/05/2015 showing findings concerning for septic arthritis and osteomyelitis.  Subjective / 24 H Interval events - endorsing pain in his left wrist - denies fever or chills   Assessment/Plan: Principal Problem:   Acute osteomyelitis of left ulna (HCC) Active Problems:   Chronic pain syndrome   Cellulitis of left hand   Arm pain, anterior   Cellulitis of finger of left hand   Cellulitis of left upper extremity   Abscess of left hand   Osteomyelitis (HCC)   Paroxysmal atrial fibrillation (HCC)  Osteomyelitis of Wrist - [name] is a 59 year old gentleman having history of left wrist abscess status post incision and drainage, found to have evidence of osteomyelitis/septic joint involving left wrist from MRI dated 05/11/2015. - He has been treated with IV ancef in the outpatient setting.  - There was concern from his infectious  disease specialist regarding progression of infection for which admission was recommended for hand surgery consultation. MRI of left wrist performed on 07/05/2015 revealing findings concerning again for septic arthritis and osteomyelitis involving carpal bones and bases of the metacarpals. - [name] consulted, took patient to OR on 12/13. Cultures pending - Antimicrobial therapy has been held in the hopes of surgical intervention in the near future for increasing yield of cultures. 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 of orthopedic surgery on 05/17/2015 - Surgical wound appears to be healing well, no evidence of active infection.  - Wound care consulted    Right femoral abscess/right femoral aneurysm - Status post excision of ruptured right mycotic SFA, with right thigh incision and drainage of abscess - Surgical incision site appears clean, no evidence of active infection  History of DVT of right lower extremity - Coumadin was held given the possibility of surgery. - INR now subtherapeutic, on Lovenox, to resume Coumadin per orthopedic surgery    Diet: Diet regular Room service appropriate?: Yes; Fluid consistency:: Thin Fluids: none  DVT Prophylaxis: Lovenox / Coumadin  Code Status: Full Code Family Communication: no family bedside  Disposition Plan: home when ready   Barriers to discharge: post op  Consultants:  Hand surgery   ID  Procedures:  I&D, debridement left wrist 12/13   Antibiotics  Anti-infectives    Start     Dose/Rate Route Frequency Ordered Stop   07/05/15 1500  ceFAZolin (ANCEF) IVPB 2 g/50 mL premix  Status:  Discontinued     2 g 100 mL/hr over 30 Minutes Intravenous 3 times per day 07/05/15 1431 07/06/15 1501   07/05/15 1000  valACYclovir (VALTREX) tablet 1,000 mg  Status:  Discontinued     1,000 mg Oral Daily 07/05/15 0013 07/05/15 0017         Studies  No results found.  Objective  Filed Vitals:   07/10/15 0520 07/10/15 0839 07/10/15 0840 07/10/15 1013  BP: 143/68 167/106 163/101 143/75  Pulse: 82 76 74 74  Temp: 98.7 F (37.1 C) 97.6 F (36.4 C)    TempSrc: Oral Oral    Resp: 18 18    Height:      Weight:      SpO2: 96% 100%      Intake/Output Summary (Last 24 hours) at 07/10/15 1314 Last data filed at 07/10/15 0857  Gross per 24 hour  Intake   1216 ml  Output   3160 ml  Net  -1944 ml   Filed Weights   07/04/15 2035  Weight: 99.701 kg (219 lb 12.8 oz)    Exam:  GENERAL: NAD  HEENT: no scleral icterus, PERRL  NECK: supple, no LAD  LUNGS: CTA biL, no wheezing  HEART: RRR without MRG  ABDOMEN: soft, non tender  EXTREMITIES: no clubbing / cyanosis. Left wrist ACE wrapped, right foot status post first and second ray amputations, no evidence of active infection  NEUROLOGIC: non focal, decreased sensation fingers 2-3 on left   PSYCHIATRIC: normal mood and affect  SKIN: no rashes  Data Reviewed: Basic Metabolic Panel:  Recent Labs Lab 07/05/15 0140 07/07/15 0511 07/10/15 0418  NA 135 135 136  K 3.9 4.2 4.2  CL 104 102 99*  CO2 GLUCOSE 110* 106* 114*  BUN CREATININE 0.77 0.82 0.85  CALCIUM 9.1 8.8* 8.9  MG 1.9  --   --    Liver Function Tests:  Recent Labs Lab 07/05/15 0140  AST 20  ALT 10*  ALKPHOS 96  BILITOT 0.3  PROT 7.9  ALBUMIN 3.6   CBC:  Recent Labs Lab 07/05/15 0140 07/06/15 0521 07/07/15 0511 07/08/15 0644 07/10/15 0418  WBC 11.0* 5.3 5.3 5.1 7.1  NEUTROABS 6.1  --   --   --   --   HGB 11.3* 10.0* 10.5* 9.8* 10.0*  HCT 37.5* 32.9* 34.8* 32.5* 33.9*  MCV 85.2 85.7 85.9 86.7 86.9  PLT 318 225 246 257 292   Cardiac Enzymes:  Recent Labs Lab 07/05/15 0140  TROPONINI <0.03   BNP (last 3 results)  Recent Labs  07/05/15 0140  BNP 20.4   Recent Results (from the past 240 hour(s))  Culture, blood (routine x 2)      Status: None   Collection Time: 07/05/15  1:40 AM  Result Value Ref Range Status   Specimen Description BLOOD BLOOD LEFT FOREARM  Final   Special Requests BOTTLES DRAWN AEROBIC ONLY 5CC  Final   Culture NO GROWTH 5 DAYS  Final   Report Status 07/10/2015 FINAL  Final  Culture, blood (routine x 2)     Status: None   Collection Time: 07/05/15  1:45 AM  Result Value Ref Range Status   Specimen Description BLOOD LEFT HAND  Final   Special Requests IN PEDIATRIC BOTTLE 3CC  Final   Culture NO GROWTH 5 DAYS  Final   Report Status 07/10/2015 FINAL  Final  Surgical pcr screen     Status: None  Collection Time: 07/08/15  5:27 PM  Result Value Ref Range Status   MRSA, PCR NEGATIVE NEGATIVE Final   Staphylococcus aureus NEGATIVE NEGATIVE Final    Comment:        The Xpert SA Assay (FDA approved for NASAL specimens in patients over 821 years of age), is one component of a comprehensive surveillance program.  Test performance has been validated by [facility] for patients greater than or equal to 57 year old. It is not intended to diagnose infection nor to guide or monitor treatment.   Body fluid culture     Status: None (Preliminary result)   Collection Time: 07/09/15  2:56 PM  Result Value Ref Range Status   Specimen Description FLUID  Final   Special Requests LEFT ULNA SWAB  Final   Gram Stain   Final    RARE WBC PRESENT, PREDOMINANTLY MONONUCLEAR NO ORGANISMS SEEN    Culture NO GROWTH < 24 HOURS  Final   Report Status PENDING  Incomplete     Scheduled Meds: . atorvastatin  80 mg Oral Daily  . docusate sodium  100 mg Oral BID  . DULoxetine  30 mg Oral Daily  . enoxaparin (LOVENOX) injection  100 mg Subcutaneous Q12H  . flecainide  100 mg Oral Q12H  . gabapentin  100 mg Oral TID  . hydrocerin   Topical Daily  . metoprolol tartrate  75 mg Oral BID  . morphine  90 mg Oral Q12H  . multivitamin with minerals  1 tablet Oral Daily  . naloxegol oxalate  25 mg Oral QAC breakfast    . polyethylene glycol  17 g Oral Daily  . sodium chloride  10 mL Intravenous Q12H  . sodium chloride  10 mL Intravenous Q12H  . temazepam  15-30 mg Oral QHS  . venlafaxine XR  75 mg Oral Q breakfast   Continuous Infusions:   Pamella Pertostin Salih Williamson, MD [facility] Pager [phone]. If 7 PM - 7 AM, please contact night-coverage at [email or web address], password [email or web address] 07/10/2015, 1:14 PM  LOS: 6 days

## 2015-07-11 DIAGNOSIS — I824Y9 Acute embolism and thrombosis of unspecified deep veins of unspecified proximal lower extremity: Secondary | ICD-10-CM

## 2015-07-11 LAB — BASIC METABOLIC PANEL WITH GFR
Anion gap: 7 (ref 5–15)
BUN: 12 mg/dL (ref 6–20)
CO2: 29 mmol/L (ref 22–32)
Calcium: 9.2 mg/dL (ref 8.9–10.3)
Chloride: 101 mmol/L (ref 101–111)
Creatinine, Ser: 0.87 mg/dL (ref 0.61–1.24)
GFR calc Af Amer: >60 mL/min (ref >60)
GFR calc non Af Amer: >60 mL/min (ref >60)
Glucose, Bld: 109 mg/dL — ABNORMAL HIGH (ref 65–99)
Potassium: 4.4 mmol/L (ref 3.5–5.1)
Sodium: 137 mmol/L (ref 135–145)

## 2015-07-11 LAB — CBC
HCT: 33.5 % — ABNORMAL LOW (ref 39.0–52.0)
Hemoglobin: 10.2 g/dL — ABNORMAL LOW (ref 13.0–17.0)
MCH: 26.5 pg (ref 26.0–34.0)
MCHC: 30.4 g/dL (ref 30.0–36.0)
MCV: 87 fL (ref 78.0–100.0)
Platelets: 179 K/uL (ref 150–400)
RBC: 3.85 MIL/uL — ABNORMAL LOW (ref 4.22–5.81)
RDW: 17.1 % — ABNORMAL HIGH (ref 11.5–15.5)
WBC: 6.1 K/uL (ref 4.0–10.5)

## 2015-07-11 MED ORDER — HEPARIN SOD (PORK) LOCK FLUSH 100 UNIT/ML IV SOLN
250.0000 [IU] | INTRAVENOUS | Status: DC | PRN
  Administered 2015-07-11: 250 [IU]

## 2015-07-11 MED ORDER — ENOXAPARIN SODIUM 100 MG/ML ~~LOC~~ SOLN: 1.5000 mg/kg | Freq: Two times a day (BID) | SUBCUTANEOUS | Status: DC

## 2015-07-11 MED ORDER — CEFAZOLIN SODIUM-DEXTROSE 2-3 GM-% IV SOLR: 2.0000 g | Freq: Three times a day (TID) | INTRAVENOUS | Status: DC

## 2015-07-11 MED ORDER — ENOXAPARIN SODIUM 100 MG/ML ~~LOC~~ SOLN: 1.5000 mg/kg | SUBCUTANEOUS | Status: DC

## 2015-07-11 NOTE — Anesthesia Postprocedure Evaluation (Signed)
Anesthesia Post Note  Patient: Craig MunchJimmy F [name]  Procedure(s) Performed: Procedure(s) (LRB): IRRIGATION AND DEBRIDEMENT LEFT WRIST (Left)  Patient location during evaluation: PACU Anesthesia Type: General Level of consciousness: sedated and patient cooperative Pain management: pain level controlled Vital Signs Assessment: post-procedure vital signs reviewed and stable Respiratory status: spontaneous breathing Cardiovascular status: stable Anesthetic complications: no    Last Vitals:  Filed Vitals:   07/11/15 0848 07/11/15 0849  BP: 177/105 177/105  Pulse: 68 68  Temp: 36.5 C   Resp: 18     Last Pain:  Filed Vitals:   07/11/15 1200  PainSc: 7                  Bilaal Leib [employer]

## 2015-07-11 NOTE — Progress Notes (Signed)
S: pt with some hand pain  O:Blood pressure 177/105, pulse 68, temperature 97.7 F (36.5 C), temperature source Oral, resp. rate 18, height 6\' 3"  (1.905 m), weight 99.701 kg (219 lb 12.8 oz), SpO2 100 %.  Dressing L hand changed, minimal drainage, min erythema, swelling of fingers present  A:L wrist Osteo, s/p I&D cultures   P:cultures neg thus far, ID restarted abx; cont hand elevation, move fingers, splint, dressing changes daily, f/u in office next Wed am.

## 2015-07-11 NOTE — Progress Notes (Signed)
D/C'd to home via w/c voices no c/o

## 2015-07-11 NOTE — Discharge Summary (Addendum)
Physician Discharge Summary  [name] MWN:[id number] DOB: [birth year] DOA: 07/04/2015  PCP: [name], MD  Admit date: 07/04/2015 Discharge date: 07/11/2015  Time spent: > 30 minutes  Recommendations for Outpatient Follow-up:  1. Follow up with [name] next week for suture removal 2. Daily dressing changes 3. Ancef as prescribed for 6 weeks, end date 08/22/2015. ID follow up in 3-4 weeks, prior to stopping antibiotics 4. Wrist cultures pending at the time of discharge    Discharge Diagnoses:  Principal Problem:   Acute osteomyelitis of left ulna [facility]) Active Problems:   Chronic pain syndrome   Cellulitis of left hand   Arm pain, anterior   Cellulitis of finger of left hand   Cellulitis of left upper extremity   Abscess of left hand   Osteomyelitis (HCC)   Paroxysmal atrial fibrillation (HCC)   Acute osteomyelitis of right radius [facility])  Discharge Condition: stable  Diet recommendation: regular  Filed Weights   07/04/15 2035  Weight: 99.701 kg (219 lb 12.8 oz)    History of present illness:  See H&P, Labs, Consult and Test reports for all details in brief, patient is a 59 y.o. male with PMH of paroxysmal atrial fibrillation, chronic diastolic CHF, pulmonary hypertension, and a very complicated course since September this year with bacteremia, abscesses, likely osteomyelitis, hematomas and fracture of the left distal radius, now presenting at the direction of his infectious disease physician, Dr. Daiva Eves, for evaluation and management of suspected osteomyelitis of the left radius and surrounding soft tissue infection  Hospital Course:  Osteomyelitis of Wrist - [name] is a 59 year old gentleman having history of left wrist abscess status post incision and drainage, found to have evidence of osteomyelitis/septic joint involving left wrist from MRI dated 05/11/2015. He has been treated with IV ancef in the outpatient setting. There was concern from his infectious disease  specialist regarding progression of infection for which admission was recommended for hand surgery consultation. MRI of left wrist performed on 07/05/2015 revealing findings concerning again for septic arthritis and osteomyelitis involving carpal bones and bases of the metacarpals. [name] consulted, took patient to OR on 12/13 for I&D, cultures were obtained and are pending at the time of discharge. He is to follow up with [name] next week and with Dr. Daiva Eves in 2-4 weeks. He needs to be on Ancef for at least 6 weeks, final length of treatment to be decided by ID in outpatient setting.  Early subcutaneous fluid collection in mid back - small, < 1 cm, can be monitor as an outpatient. Patient knows to follow up with his PCP if it doesn't resolve and expressed understanding.  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 of orthopedic surgery on 05/17/2015. Surgical wound appears to be healing well, no evidence of active infection.  Right femoral abscess/right femoral aneurysm - Status post excision of ruptured right mycotic SFA, with right thigh incision and drainage of abscess. Surgical incision site appears clean, no evidence of active infection History of DVT of right lower extremity - Coumadin was held given the possibility of surgery, he will be bridged back with Lovenox.   Procedures:  I&D, debridement left wrist 12/13   Consultations:  Orthopedic surgery - Hand, [name]  ID  Discharge Exam: Filed Vitals:   07/11/15 0521 07/11/15 0848 07/11/15 0849 07/11/15 1425  BP: 122/63 177/105 177/105 150/63  Pulse: 67  68 68 71  Temp: 98 F (36.7 C) 97.7 F (36.5 C)  98.2 F (36.8 C)  TempSrc:  Oral  Oral  Resp: 18 18  16   Height:      Weight:      SpO2: 93% 100%  100%    General: NAD Cardiovascular: RRR Respiratory: CTA biL  Discharge Instructions Activity:  As tolerated   Get Medicines  reviewed and adjusted: Please take all your medications with you for your next visit with your Primary MD  Please request your Primary MD to go over all hospital tests and procedure/radiological results at the follow up, please ask your Primary MD to get all Hospital records sent to his/her office.  If you experience worsening of your admission symptoms, develop shortness of breath, life threatening emergency, suicidal or homicidal thoughts you must seek medical attention immediately by calling 911 or calling your MD immediately if symptoms less severe.  You must read complete instructions/literature along with all the possible adverse reactions/side effects for all the Medicines you take and that have been prescribed to you. Take any new Medicines after you have completely understood and accpet all the possible adverse reactions/side effects.   Do not drive when taking Pain medications.   Do not take more than prescribed Pain, Sleep and Anxiety Medications  Special Instructions: If you have smoked or chewed Tobacco in the last 2 yrs please stop smoking, stop any regular Alcohol and or any Recreational drug use.  Wear Seat belts while driving.  Please note  You were cared for by a hospitalist during your hospital stay. Once you are discharged, your primary care physician will handle any further medical issues. Please note that NO REFILLS for any discharge medications will be authorized once you are discharged, as it is imperative that you return to your primary care physician (or establish a relationship with a primary care physician if you do not have one) for your aftercare needs so that they can reassess your need for medications and monitor your lab values.    Medication List    TAKE these medications        atorvastatin 80 MG tablet  Commonly known as:  LIPITOR  Take 1 tablet (80 mg total) by mouth daily.     ceFAZolin 2-3 GM-% Solr  Commonly known as:  ANCEF  Inject 50 mLs (2  g total) into the vein every 8 (eight) hours. For 6 weeks. Preliminary End date 08/22/2015. Stop ONLY IF INSTRUCTED by ID PHYSICIAN     docusate sodium 100 MG capsule  Commonly known as:  COLACE  Take 1 capsule (100 mg total) by mouth 2 (two) times daily.     DULoxetine 30 MG capsule  Commonly known as:  CYMBALTA  Take 60 mg by mouth daily at 12 noon.     enoxaparin 100 MG/ML injection  Commonly known as:  LOVENOX  Inject 1.5 mLs (150 mg total) into the skin daily.     famciclovir 500 MG tablet  Commonly known as:  FAMVIR  Take 500 mg by mouth 2 (two) times daily as needed (fever blisters).     flecainide 100 MG tablet  Commonly known as:  TAMBOCOR  Take 1 tablet (100 mg total) by mouth every 12 (twelve) hours.     gabapentin 100 MG capsule  Commonly known as:  NEURONTIN  Take 1 capsule (100 mg total) by mouth 3 (three) times daily.     methocarbamol 500 MG tablet  Commonly known  as:  ROBAXIN  Take 1 tablet (500 mg total) by mouth every 6 (six) hours as needed for muscle spasms.     Metoprolol Tartrate 75 MG Tabs  Take 75 mg by mouth 2 (two) times daily.     morphine 30 MG 12 hr tablet  Commonly known as:  MS CONTIN  Take 3 tablets (90 mg total) by mouth every 12 (twelve) hours.     multivitamin with minerals Tabs tablet  Take 1 tablet by mouth daily.     naloxegol oxalate 25 MG Tabs tablet  Commonly known as:  MOVANTIK  Take 1 tablet (25 mg total) by mouth daily before breakfast.     naproxen 500 MG tablet  Commonly known as:  NAPROSYN  Take 1 tablet (500 mg total) by mouth 2 (two) times daily with a meal.     oxycodone 30 MG immediate release tablet  Commonly known as:  ROXICODONE  Take 1 tablet (30 mg total) by mouth every 6 (six) hours. scheduled     polyethylene glycol packet  Commonly known as:  MIRALAX / GLYCOLAX  Take 17 g by mouth daily.     PROAIR HFA 108 (90 BASE) MCG/ACT inhaler  Generic drug:  albuterol  Inhale 1 puff into the lungs every 6 (six)  hours as needed for wheezing or shortness of breath.     SUMAtriptan 50 MG tablet  Commonly known as:  IMITREX  Take 50 mg by mouth daily as needed for migraine or headache. Maximum 2 doses in 2 days     temazepam 15 MG capsule  Commonly known as:  RESTORIL  Take 15-30 mg by mouth at bedtime.     testosterone cypionate 200 MG/ML injection  Commonly known as:  DEPOTESTOSTERONE CYPIONATE  Inject 100 mg into the muscle every 14 (fourteen) days.     trifluridine 1 % ophthalmic solution  Commonly known as:  VIROPTIC  Place 1 drop into both eyes 2 (two) times daily as needed (breakouts (blurry, matted eyelids)).     venlafaxine XR 75 MG 24 hr capsule  Commonly known as:  EFFEXOR-XR  Take 1 capsule (75 mg total) by mouth daily with breakfast.     warfarin 5 MG tablet  Commonly known as:  COUMADIN  1 tablet Mondays and Thursdays. 1-1/2 tablet Sunday Tuesday Wednesday Friday Saturday       Follow-up Information    Follow up with Molinda BailiffOLEY,HARRILL CHRISTOPHER, MD. Schedule an appointment as soon as possible for a visit in 6 days.   Specialty:  General Surgery   Why:  For suture removal, For wound re-check   at Monday December 19th 8:45 a.m.   Contact information:   [address] [place] [address] [phone]       Follow up with [facility].   Why:  HHRN for IV ABX   Contact information:   [address] [place] [address] [phone]       Follow up with Acey Lavornelius Van Dam, MD. Schedule an appointment as soon as possible for a visit in 3 weeks.   Specialty:  Infectious Diseases   Contact information:   [address] [place] [address] [phone]       The results of significant diagnostics from this hospitalization (including imaging, microbiology, ancillary and laboratory) are listed below for reference.    Significant Diagnostic Studies: Dg Chest 1 View  07/05/2015  CLINICAL DATA:  Preop.  EXAM: CHEST 1 VIEW COMPARISON:  05/08/2015 FINDINGS: There is a right upper extremity PICC with tip at the upper cavoatrial junction. Normal heart size and mediastinal contours. There is no edema, consolidation, effusion, or pneumothorax. IMPRESSION: No active disease. Electronically Signed   By: Marnee Spring M.D.   On: 07/05/2015 08:48   Craig Hand Left W Wo Contrast  07/05/2015  CLINICAL DATA:  Pain and swelling of the left wrist and hand. History of left radius fracture. EXAM: Craig OF THE LEFT WRIST WITHOUT AND WITH CONTRAST TECHNIQUE: Multiplanar multisequence Craig imaging of the left wrist was performed both before and after the administration of intravenous contrast. CONTRAST:  20mL MULTIHANCE GADOBENATE DIMEGLUMINE 529 MG/ML IV SOLN COMPARISON:  05/11/2015 FINDINGS: There is severe patient motion degrading image quality limiting evaluation. Ligaments: Nonvisualized lunotriquetral ligament. Nonvisualized scapholunate ligament. Triangular fibrocartilage: Limited visualization secondary to patient motion. Tendons: Intact flexor and extensor compartment tendons. Fluid in the extensor carpi radialis longus and brevis tendon sheath with synovial enhancement most consistent with tenosynovitis. Carpal tunnel/median nerve: Normal carpal tunnel. Normal median nerve. Guyon's canal: Normal. Joint/cartilage: Suboptimal visualization secondary to severe patient motion. There is apparent diffuse chondral loss and joint space narrowing. Bones/carpal alignment: Diffuse marrow edema in the distal radius, distal ulna, throughout the carpal bones and at the bases of the metacarpals most severe in the second metacarpal. Other: Diffuse soft tissue swelling and marked synovial thickening with enhancement. 1.9 x 0.8 x 1.2 cm peripherally enhancing fluid collection which appears to emanate from the dorsal distal radioulnar joint. IMPRESSION: 1. Severely limited examination secondary to significant patient motion throughout the exam  degrading image quality. 2. Severe edema involving the distal radius, distal ulna, throughout the carpal bones and bases of the metacarpals with severe synovial enhancement and extensive joint space narrowing most concerning for septic arthritis and osteomyelitis versus alternatively aggressive inflammatory or crystalline arthropathy. 1.9 x 0.8 x 1.2 cm peripherally enhancing fluid collection which appears to emanate from the dorsal distal radioulnar joint. 3. Tenosynovitis of the extensor carpi radialis longus and brevis which may be inflammatory versus infectious. Electronically Signed   By: Elige Ko   On: 07/05/2015 08:24   Craig Wrist Left W Wo Contrast  07/05/2015  CLINICAL DATA:  Pain and swelling of the left wrist and hand. History of left radius fracture. EXAM: Craig OF THE LEFT WRIST WITHOUT AND WITH CONTRAST TECHNIQUE: Multiplanar multisequence Craig imaging of the left wrist was performed both before and after the administration of intravenous contrast. CONTRAST:  20mL MULTIHANCE GADOBENATE DIMEGLUMINE 529 MG/ML IV SOLN COMPARISON:  05/11/2015 FINDINGS: There is severe patient motion degrading image quality limiting evaluation. Ligaments: Nonvisualized lunotriquetral ligament. Nonvisualized scapholunate ligament. Triangular fibrocartilage: Limited visualization secondary to patient motion. Tendons: Intact flexor and extensor compartment tendons. Fluid in the extensor carpi radialis longus and brevis tendon sheath with synovial enhancement most consistent with tenosynovitis. Carpal tunnel/median nerve: Normal carpal tunnel. Normal median nerve. Guyon's canal: Normal. Joint/cartilage: Suboptimal visualization secondary to severe patient motion. There is apparent diffuse chondral loss and joint space narrowing. Bones/carpal alignment: Diffuse marrow edema in the distal radius, distal ulna, throughout the carpal bones and at the bases of the metacarpals most severe in the second metacarpal. Other: Diffuse soft  tissue swelling and marked synovial thickening with enhancement. 1.9 x 0.8 x 1.2 cm peripherally enhancing fluid collection which appears to emanate from the dorsal distal radioulnar joint. IMPRESSION: 1. Severely limited examination secondary to significant patient motion throughout the exam degrading image quality.  2. Severe edema involving the distal radius, distal ulna, throughout the carpal bones and bases of the metacarpals with severe synovial enhancement and extensive joint space narrowing most concerning for septic arthritis and osteomyelitis versus alternatively aggressive inflammatory or crystalline arthropathy. 1.9 x 0.8 x 1.2 cm peripherally enhancing fluid collection which appears to emanate from the dorsal distal radioulnar joint. 3. Tenosynovitis of the extensor carpi radialis longus and brevis which may be inflammatory versus infectious. Electronically Signed   By: Elige Ko   On: 07/05/2015 08:24    Microbiology: Recent Results (from the past 240 hour(s))  Culture, blood (routine x 2)     Status: None   Collection Time: 07/05/15  1:40 AM  Result Value Ref Range Status   Specimen Description BLOOD BLOOD LEFT FOREARM  Final   Special Requests BOTTLES DRAWN AEROBIC ONLY 5CC  Final   Culture NO GROWTH 5 DAYS  Final   Report Status 07/10/2015 FINAL  Final  Culture, blood (routine x 2)     Status: None   Collection Time: 07/05/15  1:45 AM  Result Value Ref Range Status   Specimen Description BLOOD LEFT HAND  Final   Special Requests IN PEDIATRIC BOTTLE 3CC  Final   Culture NO GROWTH 5 DAYS  Final   Report Status 07/10/2015 FINAL  Final  Surgical pcr screen     Status: None   Collection Time: 07/08/15  5:27 PM  Result Value Ref Range Status   MRSA, PCR NEGATIVE NEGATIVE Final   Staphylococcus aureus NEGATIVE NEGATIVE Final    Comment:        The Xpert SA Assay (FDA approved for NASAL specimens in patients over 70 years of age), is one component of a comprehensive  surveillance program.  Test performance has been validated by [facility] for patients greater than or equal to 73 year old. It is not intended to diagnose infection nor to guide or monitor treatment.   Anaerobic culture     Status: None (Preliminary result)   Collection Time: 07/09/15  2:19 PM  Result Value Ref Range Status   Specimen Description TISSUE LEFT WRIST  Final   Special Requests SPEC A IN CUP  Final   Gram Stain   Final    RARE WBC PRESENT, PREDOMINANTLY MONONUCLEAR NO ORGANISMS SEEN Performed at [facility]    Culture   Final    NO ANAEROBES ISOLATED; CULTURE IN PROGRESS FOR 5 DAYS Performed at [facility]    Report Status PENDING  Incomplete  Fungus Culture with Smear     Status: None (Preliminary result)   Collection Time: 07/09/15  2:19 PM  Result Value Ref Range Status   Specimen Description TISSUE WRIST LEFT  Final   Special Requests SPEC A IN CUP  Final   Fungal Smear   Final    NO YEAST OR FUNGAL ELEMENTS SEEN Performed at [facility]    Culture   Final    CULTURE IN PROGRESS FOR FOUR WEEKS Performed at [facility]    Report Status PENDING  Incomplete  Tissue culture     Status: None (Preliminary result)   Collection Time: 07/09/15  2:19 PM  Result Value Ref Range Status   Specimen Description TISSUE WRIST LEFT  Final   Special Requests SPEC A IN CUP  Final   Gram Stain   Final    RARE WBC PRESENT, PREDOMINANTLY MONONUCLEAR NO ORGANISMS SEEN Performed at [facility]  Final    BACILLUS SPECIES Note: Standardized susceptibility testing for this organism is not available. Performed at [facility]    Report Status PENDING  Incomplete  AFB culture with smear     Status: None (Preliminary result)   Collection Time: 07/09/15  2:19 PM  Result Value Ref Range Status   Specimen Description TISSUE WRIST LEFT  Final   Special Requests SPEC A IN CUP  Final   Acid Fast Smear   Final     NO ACID FAST BACILLI SEEN Performed at [facility]    Culture   Final    CULTURE WILL BE EXAMINED FOR 6 WEEKS BEFORE ISSUING A FINAL REPORT Performed at [facility]    Report Status PENDING  Incomplete  Anaerobic culture     Status: None (Preliminary result)   Collection Time: 07/09/15  2:50 PM  Result Value Ref Range Status   Specimen Description WOUND LEFT WRIST  Final   Special Requests SPEC B ON SWAB  Final   Gram Stain PENDING  Incomplete   Culture   Final    NO ANAEROBES ISOLATED; CULTURE IN PROGRESS FOR 5 DAYS Performed at [facility]    Report Status PENDING  Incomplete  Body fluid culture     Status: None (Preliminary result)   Collection Time: 07/09/15  2:56 PM  Result Value Ref Range Status   Specimen Description FLUID  Final   Special Requests SPEC C ULNA FLUID ON SWAB  Final   Gram Stain   Final    FEW WBC PRESENT, PREDOMINANTLY PMN NO SQUAMOUS EPITHELIAL CELLS SEEN NO ORGANISMS SEEN Performed at [facility]    Culture NO GROWTH 2 DAYS  Final   Report Status PENDING  Incomplete  Wound culture     Status: None (Preliminary result)   Collection Time: 07/09/15  4:50 PM  Result Value Ref Range Status   Specimen Description WOUND LEFT WRIST  Final   Special Requests SPEC B ON SWAB  Final   Gram Stain   Final    FEW WBC PRESENT, PREDOMINANTLY PMN NO SQUAMOUS EPITHELIAL CELLS SEEN NO ORGANISMS SEEN Performed at [facility]    Culture   Final    NO GROWTH 1 DAY Performed at [facility]    Report Status PENDING  Incomplete     Labs: Basic Metabolic Panel:  Recent Labs Lab 07/05/15 0140 07/07/15 0511 07/10/15 0418 07/11/15 0511  NA 135 135 136 137  K 3.9 4.2 4.2 4.4  CL 104 102 99* 101  CO2 22 27 30 29   GLUCOSE 110* 106* 114* 109*  BUN 14 15 11 12   CREATININE 0.77 0.82 0.85 0.87  CALCIUM 9.1 8.8* 8.9 9.2  MG 1.9  --   --   --    Liver Function Tests:  Recent Labs Lab 07/05/15 0140  AST  20  ALT 10*  ALKPHOS 96  BILITOT 0.3  PROT 7.9  ALBUMIN 3.6   CBC:  Recent Labs Lab 07/05/15 0140 07/06/15 0521 07/07/15 0511 07/08/15 0644 07/10/15 0418 07/11/15 0511  WBC 11.0* 5.3 5.3 5.1 7.1 6.1  NEUTROABS 6.1  --   --   --   --   --   HGB 11.3* 10.0* 10.5* 9.8* 10.0* 10.2*  HCT 37.5* 32.9* 34.8* 32.5* 33.9* 33.5*  MCV 85.2 85.7 85.9 86.7 86.9 87.0  PLT 318 225 246 257 292 179   Cardiac Enzymes:  Recent Labs Lab 07/05/15 0140  TROPONINI <0.03   BNP: BNP (last 3 results)  Recent Labs  07/05/15 0140  BNP 20.4    Signed:  Terelle Dobler  [facility] 07/11/2015, 4:09 PM

## 2015-07-11 NOTE — Progress Notes (Signed)
INFECTIOUS DISEASE PROGRESS NOTE  ID: [name] is a 59 y.o. male with  Principal Problem:   Acute osteomyelitis of left ulna (HCC) Active Problems:   Chronic pain syndrome   Cellulitis of left hand   Arm pain, anterior   Cellulitis of finger of left hand   Cellulitis of left upper extremity   Abscess of left hand   Osteomyelitis (HCC)   Paroxysmal atrial fibrillation (HCC)   Acute osteomyelitis of right radius (HCC)  Subjective: Without complaints.  Has new swelling on upper back.   Abtx:  Anti-infectives    Start     Dose/Rate Route Frequency Ordered Stop   07/10/15 1645  ceFAZolin (ANCEF) IVPB 2 g/50 mL premix     2 g 100 mL/hr over 30 Minutes Intravenous 3 times per day 07/10/15 1631     07/05/15 1500  ceFAZolin (ANCEF) IVPB 2 g/50 mL premix  Status:  Discontinued     2 g 100 mL/hr over 30 Minutes Intravenous 3 times per day 07/05/15 1431 07/06/15 1501   07/05/15 1000  valACYclovir (VALTREX) tablet 1,000 mg  Status:  Discontinued     1,000 mg Oral Daily 07/05/15 0013 07/05/15 0017      Medications:  Scheduled: . atorvastatin  80 mg Oral Daily  .  ceFAZolin (ANCEF) IV  2 g Intravenous 3 times per day  . docusate sodium  100 mg Oral BID  . DULoxetine  30 mg Oral Daily  . enoxaparin (LOVENOX) injection  100 mg Subcutaneous Q12H  . flecainide  100 mg Oral Q12H  . gabapentin  100 mg Oral TID  . hydrocerin   Topical Daily  . metoprolol tartrate  75 mg Oral BID  . morphine  90 mg Oral Q12H  . multivitamin with minerals  1 tablet Oral Daily  . naloxegol oxalate  25 mg Oral QAC breakfast  . polyethylene glycol  17 g Oral Daily  . sodium chloride  10 mL Intravenous Q12H  . sodium chloride  10 mL Intravenous Q12H  . temazepam  15-30 mg Oral QHS  . venlafaxine XR  75 mg Oral Q breakfast    Objective: Vital signs in last 24 hours: Temp:  [97.7 F (36.5 C)-98.6 F (37 C)] 97.7 F (36.5 C) (12/15 0848) Pulse Rate:  [66-74] 68 (12/15 0849) Resp:  [18] 18 (12/15  0848) BP: (122-177)/(62-105) 177/105 mmHg (12/15 0849) SpO2:  [93 %-100 %] 100 % (12/15 0848)   General appearance: alert, cooperative and no distress Back: over spine, mid back, quarter sized swelling.  Extremities: LUE wrapped.   Lab Results  Recent Labs  07/10/15 0418 07/11/15 0511  WBC 7.1 6.1  HGB 10.0* 10.2*  HCT 33.9* 33.5*  NA 136 137  K 4.2 4.4  CL 99* 101  CO2 30 29  BUN 11 12  CREATININE 0.85 0.87   Liver Panel No results for input(s): PROT, ALBUMIN, AST, ALT, ALKPHOS, BILITOT, BILIDIR, IBILI in the last 72 hours. Sedimentation Rate No results for input(s): ESRSEDRATE in the last 72 hours. C-Reactive Protein No results for input(s): CRP in the last 72 hours.  Microbiology: Recent Results (from the past 240 hour(s))  Culture, blood (routine x 2)     Status: None   Collection Time: 07/05/15  1:40 AM  Result Value Ref Range Status   Specimen Description BLOOD BLOOD LEFT FOREARM  Final   Special Requests BOTTLES DRAWN AEROBIC ONLY 5CC  Final   Culture NO GROWTH 5 DAYS  Final   Report Status 07/10/2015 FINAL  Final  Culture, blood (routine x 2)     Status: None   Collection Time: 07/05/15  1:45 AM  Result Value Ref Range Status   Specimen Description BLOOD LEFT HAND  Final   Special Requests IN PEDIATRIC BOTTLE 3CC  Final   Culture NO GROWTH 5 DAYS  Final   Report Status 07/10/2015 FINAL  Final  Surgical pcr screen     Status: None   Collection Time: 07/08/15  5:27 PM  Result Value Ref Range Status   MRSA, PCR NEGATIVE NEGATIVE Final   Staphylococcus aureus NEGATIVE NEGATIVE Final    Comment:        The Xpert SA Assay (FDA approved for NASAL specimens in patients over 57 years of age), is one component of a comprehensive surveillance program.  Test performance has been validated by [facility] for patients greater than or equal to 57 year old. It is not intended to diagnose infection nor to guide or monitor treatment.   Anaerobic culture      Status: None (Preliminary result)   Collection Time: 07/09/15  2:19 PM  Result Value Ref Range Status   Specimen Description TISSUE LEFT WRIST  Final   Special Requests SPEC A IN CUP  Final   Gram Stain   Final    RARE WBC PRESENT, PREDOMINANTLY MONONUCLEAR NO ORGANISMS SEEN Performed at [facility]    Culture PENDING  Incomplete   Report Status PENDING  Incomplete  Fungus Culture with Smear     Status: None (Preliminary result)   Collection Time: 07/09/15  2:19 PM  Result Value Ref Range Status   Specimen Description TISSUE WRIST LEFT  Final   Special Requests SPEC A IN CUP  Final   Fungal Smear   Final    NO YEAST OR FUNGAL ELEMENTS SEEN Performed at [facility]    Culture   Final    CULTURE IN PROGRESS FOR FOUR WEEKS Performed at [facility]    Report Status PENDING  Incomplete  Tissue culture     Status: None (Preliminary result)   Collection Time: 07/09/15  2:19 PM  Result Value Ref Range Status   Specimen Description TISSUE WRIST LEFT  Final   Special Requests SPEC A IN CUP  Final   Gram Stain   Final    RARE WBC PRESENT, PREDOMINANTLY MONONUCLEAR NO ORGANISMS SEEN Performed at [facility]    Culture PENDING  Incomplete   Report Status PENDING  Incomplete  AFB culture with smear     Status: None (Preliminary result)   Collection Time: 07/09/15  2:19 PM  Result Value Ref Range Status   Specimen Description TISSUE WRIST LEFT  Final   Special Requests SPEC A IN CUP  Final   Acid Fast Smear   Final    NO ACID FAST BACILLI SEEN Performed at [facility]    Culture   Final    CULTURE WILL BE EXAMINED FOR 6 WEEKS BEFORE ISSUING A FINAL REPORT Performed at [facility]    Report Status PENDING  Incomplete  Body fluid culture     Status: None (Preliminary result)   Collection Time: 07/09/15  2:56 PM  Result Value Ref Range Status   Specimen Description FLUID  Final   Special Requests SPEC C ULNA FLUID ON SWAB   Final   Gram Stain   Final    FEW WBC PRESENT, PREDOMINANTLY PMN NO  SQUAMOUS EPITHELIAL CELLS SEEN NO ORGANISMS SEEN Performed at [facility]    Culture NO GROWTH 2 DAYS  Final   Report Status PENDING  Incomplete  Wound culture     Status: None (Preliminary result)   Collection Time: 07/09/15  4:50 PM  Result Value Ref Range Status   Specimen Description WOUND LEFT WRIST  Final   Special Requests SPEC B ON SWAB  Final   Gram Stain   Final    FEW WBC PRESENT, PREDOMINANTLY PMN NO SQUAMOUS EPITHELIAL CELLS SEEN NO ORGANISMS SEEN Performed at [facility]    Culture PENDING  Incomplete   Report Status PENDING  Incomplete    Studies/Results: No results found.   Assessment/Plan: Osteomyelitis of L wrist Prev mssa sepsis R foot wounds  Total days of antibiotics: 1 ancef  Continue ancef Await his Cx Will check his Ig levels Ok for d/c from my point of view, when cleared by hand.  Can f/u with Dr Daiva Eves in ID clinic.          [name] Infectious Diseases (pager) [phone] [email or web address] 07/11/2015, 9:43 AM  LOS: 7 days

## 2015-07-11 NOTE — Progress Notes (Deleted)
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 He was eventually discharged on Ancef for a total of 6 weeks. Patient was transferred to acute rehabilitation however came back to the hospital about a week later with fever. For the workup of left hand with MRI revealed findings concerning for osteomyelitis. Patient was also found to have gangrene of right great toe and second toe, undergoing first and second ray amputation, procedure performed by Dr. Lajoyce Cornersuda of Orthopedic Surgery. Mr. Caryn SectionFox was discharged on an additional 8 weeks of IV Ancef. Concern for persistent left wrist pain associate with swelling consistent with further progression of infection. MRI of left hand performed on 07/05/2015 showing findings concerning for septic arthritis and osteomyelitis.  Subjective / 24 H Interval events - endorsing pain in his left wrist - new "bump" on his back    Assessment/Plan: Principal Problem:   Acute osteomyelitis of left ulna (HCC) Active Problems:   Chronic pain syndrome   Cellulitis of left hand   Arm pain, anterior   Cellulitis of finger of left hand   Cellulitis of left upper extremity   Abscess of left hand   Osteomyelitis (HCC)   Paroxysmal atrial fibrillation (HCC)   Acute osteomyelitis of right radius (HCC)  Osteomyelitis of Wrist - Mr Caryn SectionFox is a 59 year old gentleman having history of left wrist abscess status post incision and drainage, found to have evidence of osteomyelitis/septic joint involving left wrist from MRI dated 05/11/2015. - He has been treated with IV ancef in the outpatient setting.   - There was concern from his infectious disease specialist regarding progression of infection for which admission was recommended for hand surgery consultation. MRI of left wrist performed on 07/05/2015 revealing findings concerning again for septic arthritis and osteomyelitis involving carpal bones and bases of the metacarpals. - Dr. Izora Ribasoley consulted, took patient to OR on 12/13. Cultures pending - d/c when stable and cleared by hand surgery.   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 Lajoyce Cornersuda of orthopedic surgery on 05/17/2015 - Surgical wound appears to be healing well, no evidence of active infection.  - Wound care consulted    Right femoral abscess/right femoral aneurysm - Status post excision of ruptured right mycotic SFA, with right thigh incision and drainage of abscess - Surgical incision site appears clean, no evidence of active infection  History of DVT of right lower extremity - Coumadin was held given the possibility of surgery. - INR now subtherapeutic, on Lovenox, to resume Coumadin per orthopedic surgery    Diet: Diet regular Room service appropriate?: Yes; Fluid consistency:: Thin Fluids: none  DVT Prophylaxis: Lovenox / Coumadin  Code Status: Full Code Family Communication: no family bedside  Disposition Plan: home 1-2 days Barriers to discharge: awaiting clearance by hand surgery   Consultants:  Hand surgery   ID  Procedures:  I&D, debridement left wrist 12/13   Antibiotics  Anti-infectives    Start     Dose/Rate Route Frequency Ordered Stop  07/10/15 1645  ceFAZolin (ANCEF) IVPB 2 g/50 mL premix     2 g 100 mL/hr over 30 Minutes Intravenous 3 times per day 07/10/15 1631     07/05/15 1500  ceFAZolin (ANCEF) IVPB 2 g/50 mL premix  Status:  Discontinued     2 g 100 mL/hr over 30  Minutes Intravenous 3 times per day 07/05/15 1431 07/06/15 1501   07/05/15 1000  valACYclovir (VALTREX) tablet 1,000 mg  Status:  Discontinued     1,000 mg Oral Daily 07/05/15 0013 07/05/15 0017       Studies  No results found.  Objective  Filed Vitals:   07/11/15 0521 07/11/15 0848 07/11/15 0849 07/11/15 1425  BP: 122/63 177/105 177/105 150/63  Pulse: 67 68 68 71  Temp: 98 F (36.7 C) 97.7 F (36.5 C)  98.2 F (36.8 C)  TempSrc:  Oral  Oral  Resp: Height:      Weight:      SpO2: 93% 100%  100%    Intake/Output Summary (Last 24 hours) at 07/11/15 1431 Last data filed at 07/11/15 1117  Gross per 24 hour  Intake    340 ml  Output   2275 ml  Net  -1935 ml   Filed Weights   07/04/15 2035  Weight: 99.701 kg (219 lb 12.8 oz)    Exam:  GENERAL: NAD  HEENT: no scleral icterus  LUNGS: CTA biL, no wheezing  HEART: RRR without MRG  ABDOMEN: soft, non tender  EXTREMITIES: no clubbing / cyanosis. Left wrist ACE wrapped, right foot status post first and second ray amputations, no evidence of active infection.   MSK: 1 cm subcutaneous mobile fluid collection mid back; mild tenderness to palpation  Data Reviewed: Basic Metabolic Panel:  Recent Labs Lab 07/05/15 0140 07/07/15 0511 07/10/15 0418 07/11/15 0511  NA 135 135 136 137  K 3.9 4.2 4.2 4.4  CL 104 102 99* 101  CO2 GLUCOSE 110* 106* 114* 109*  BUN CREATININE 0.77 0.82 0.85 0.87  CALCIUM 9.1 8.8* 8.9 9.2  MG 1.9  --   --   --    Liver Function Tests:  Recent Labs Lab 07/05/15 0140  AST 20  ALT 10*  ALKPHOS 96  BILITOT 0.3  PROT 7.9  ALBUMIN 3.6   CBC:  Recent Labs Lab 07/05/15 0140 07/06/15 0521 07/07/15 0511 07/08/15 0644 07/10/15 0418 07/11/15 0511  WBC 11.0* 5.3 5.3 5.1 7.1 6.1  NEUTROABS 6.1  --   --   --   --   --   HGB 11.3* 10.0* 10.5* 9.8* 10.0* 10.2*  HCT 37.5* 32.9* 34.8* 32.5* 33.9* 33.5*  MCV 85.2 85.7 85.9 86.7 86.9 87.0  PLT  318 225 246 257 292 179   Cardiac Enzymes:  Recent Labs Lab 07/05/15 0140  TROPONINI <0.03   BNP (last 3 results)  Recent Labs  07/05/15 0140  BNP 20.4   Recent Results (from the past 240 hour(s))  Culture, blood (routine x 2)     Status: None   Collection Time: 07/05/15  1:40 AM  Result Value Ref Range Status   Specimen Description BLOOD BLOOD LEFT FOREARM  Final   Special Requests BOTTLES DRAWN AEROBIC ONLY 5CC  Final   Culture NO GROWTH 5 DAYS  Final   Report Status 07/10/2015 FINAL  Final  Culture, blood (routine x 2)     Status: None   Collection  Time: 07/05/15  1:45 AM  Result Value Ref Range Status   Specimen Description BLOOD LEFT HAND  Final   Special Requests IN PEDIATRIC BOTTLE 3CC  Final   Culture NO GROWTH 5 DAYS  Final   Report Status 07/10/2015 FINAL  Final  Surgical pcr screen     Status: None   Collection Time: 07/08/15  5:27 PM  Result Value Ref Range Status   MRSA, PCR NEGATIVE NEGATIVE Final   Staphylococcus aureus NEGATIVE NEGATIVE Final    Comment:        The Xpert SA Assay (FDA approved for NASAL specimens in patients over 33 years of age), is one component of a comprehensive surveillance program.  Test performance has been validated by [facility] for patients greater than or equal to 64 year old. It is not intended to diagnose infection nor to guide or monitor treatment.   Anaerobic culture     Status: None (Preliminary result)   Collection Time: 07/09/15  2:19 PM  Result Value Ref Range Status   Specimen Description TISSUE LEFT WRIST  Final   Special Requests SPEC A IN CUP  Final   Gram Stain   Final    RARE WBC PRESENT, PREDOMINANTLY MONONUCLEAR NO ORGANISMS SEEN Performed at [facility]    Culture   Final    NO ANAEROBES ISOLATED; CULTURE IN PROGRESS FOR 5 DAYS Performed at [facility]    Report Status PENDING  Incomplete  Fungus Culture with Smear     Status: None (Preliminary result)   Collection Time:  07/09/15  2:19 PM  Result Value Ref Range Status   Specimen Description TISSUE WRIST LEFT  Final   Special Requests SPEC A IN CUP  Final   Fungal Smear   Final    NO YEAST OR FUNGAL ELEMENTS SEEN Performed at [facility]    Culture   Final    CULTURE IN PROGRESS FOR FOUR WEEKS Performed at [facility]    Report Status PENDING  Incomplete  Tissue culture     Status: None (Preliminary result)   Collection Time: 07/09/15  2:19 PM  Result Value Ref Range Status   Specimen Description TISSUE WRIST LEFT  Final   Special Requests SPEC A IN CUP  Final   Gram Stain   Final    RARE WBC PRESENT, PREDOMINANTLY MONONUCLEAR NO ORGANISMS SEEN Performed at [facility]    Culture   Final    BACILLUS SPECIES Note: Standardized susceptibility testing for this organism is not available. Performed at [facility]    Report Status PENDING  Incomplete  AFB culture with smear     Status: None (Preliminary result)   Collection Time: 07/09/15  2:19 PM  Result Value Ref Range Status   Specimen Description TISSUE WRIST LEFT  Final   Special Requests SPEC A IN CUP  Final   Acid Fast Smear   Final    NO ACID FAST BACILLI SEEN Performed at [facility]    Culture   Final    CULTURE WILL BE EXAMINED FOR 6 WEEKS BEFORE ISSUING A FINAL REPORT Performed at [facility]    Report Status PENDING  Incomplete  Anaerobic culture     Status: None (Preliminary result)   Collection Time: 07/09/15  2:50 PM  Result Value Ref Range Status   Specimen Description WOUND LEFT WRIST  Final   Special Requests SPEC B ON SWAB  Final   Gram  Stain PENDING  Incomplete   Culture   Final    NO ANAEROBES ISOLATED; CULTURE IN PROGRESS FOR 5 DAYS Performed at [facility]    Report Status PENDING  Incomplete  Body fluid culture     Status: None (Preliminary result)   Collection Time: 07/09/15  2:56 PM  Result Value Ref Range Status   Specimen Description FLUID   Final   Special Requests SPEC C ULNA FLUID ON SWAB  Final   Gram Stain   Final    FEW WBC PRESENT, PREDOMINANTLY PMN NO SQUAMOUS EPITHELIAL CELLS SEEN NO ORGANISMS SEEN Performed at [facility]    Culture NO GROWTH 2 DAYS  Final   Report Status PENDING  Incomplete  Wound culture     Status: None (Preliminary result)   Collection Time: 07/09/15  4:50 PM  Result Value Ref Range Status   Specimen Description WOUND LEFT WRIST  Final   Special Requests SPEC B ON SWAB  Final   Gram Stain   Final    FEW WBC PRESENT, PREDOMINANTLY PMN NO SQUAMOUS EPITHELIAL CELLS SEEN NO ORGANISMS SEEN Performed at [facility]    Culture   Final    NO GROWTH 1 DAY Performed at [facility]    Report Status PENDING  Incomplete     Scheduled Meds: . atorvastatin  80 mg Oral Daily  .  ceFAZolin (ANCEF) IV  2 g Intravenous 3 times per day  . docusate sodium  100 mg Oral BID  . DULoxetine  30 mg Oral Daily  . enoxaparin (LOVENOX) injection  100 mg Subcutaneous Q12H  . flecainide  100 mg Oral Q12H  . gabapentin  100 mg Oral TID  . hydrocerin   Topical Daily  . metoprolol tartrate  75 mg Oral BID  . morphine  90 mg Oral Q12H  . multivitamin with minerals  1 tablet Oral Daily  . naloxegol oxalate  25 mg Oral QAC breakfast  . polyethylene glycol  17 g Oral Daily  . sodium chloride  10 mL Intravenous Q12H  . sodium chloride  10 mL Intravenous Q12H  . temazepam  15-30 mg Oral QHS  . venlafaxine XR  75 mg Oral Q breakfast   Continuous Infusions:   Pamella Pert, MD [facility] Pager [phone]. If 7 PM - 7 AM, please contact night-coverage at [email or web address], password [facility] 07/11/2015, 2:31 PM  LOS: 7 days

## 2015-07-11 NOTE — Care Management Note (Signed)
Case Management Note  Patient Details  Name: Craig MunchJimmy F Bluitt MRN: [id number] Date of Birth: 1955/12/08  Subjective/Objective:     Patient is for dc today, will resume HHRN for IV ABX with [facility].  [facility] notified.  Patient 's wife states she already knows what to do, they have been doing this for 3 months.  Tiffany with [facility] states they will get the medication out to patient home, the next dose is at 10 pm and the wife will be giving that dose to patient.  Patient will also be on lovenox and he states that he can afford it ,he has been on this before.  NCM gave him the script for the lovenox.  Patient will be receiving ancef iv q8.  Tiffany with [facility] has the script for the ancef.                 Action/Plan:   Expected Discharge Date:                  Expected Discharge Plan:  Home w Home Health Services  In-House Referral:     Discharge planning Services  CM Consult  Post Acute Care Choice:  Resumption of Svcs/PTA Provider Choice offered to:  Patient  DME Arranged:    DME Agency:     HH Arranged:   HHRN, IV antibiotics HH Agency:   [facility]  Status of Service:  Completed, signed off  Medicare Important Message Given:    Date Medicare IM Given:    Medicare IM give by:    Date Additional Medicare IM Given:    Additional Medicare Important Message give by:     If discussed at Long Length of Stay Meetings, dates discussed:    Additional Comments:  Leone Havenaylor, Thanya Cegielski [place], RN 07/11/2015, 3:47 PM

## 2015-07-12 ENCOUNTER — Ambulatory Visit (HOSPITAL_COMMUNITY): Admission: RE | Admit: 2015-07-12 | Payer: Medicaid Other | Source: Ambulatory Visit

## 2015-07-12 ENCOUNTER — Ambulatory Visit (HOSPITAL_COMMUNITY)
Admission: RE | Admit: 2015-07-12 | Discharge: 2015-07-12 | Disposition: A | Discharge status: Home / Self Care | Payer: Medicaid Other | Source: Ambulatory Visit | Attending: Infectious Disease | Admitting: Infectious Disease

## 2015-07-12 LAB — WOUND CULTURE: Culture: NO GROWTH

## 2015-07-12 LAB — IGG, IGA, IGM
IGA: 249 mg/dL (ref 90–386)
IGG (IMMUNOGLOBIN G), SERUM: 1495 mg/dL (ref 700–1600)
IGM, SERUM: 205 mg/dL — AB (ref 20–172)

## 2015-07-13 LAB — BODY FLUID CULTURE: Culture: NO GROWTH

## 2015-07-13 LAB — TISSUE CULTURE

## 2015-07-14 LAB — ANAEROBIC CULTURE

## 2015-07-15 LAB — ANAEROBIC CULTURE

## 2015-07-17 ENCOUNTER — Encounter: Payer: Self-pay | Admitting: Vascular Surgery

## 2015-07-26 ENCOUNTER — Encounter: Payer: Self-pay | Admitting: Vascular Surgery

## 2015-07-26 ENCOUNTER — Ambulatory Visit (INDEPENDENT_AMBULATORY_CARE_PROVIDER_SITE_OTHER): Payer: Self-pay | Admitting: Vascular Surgery

## 2015-07-26 VITALS — BP 130/84 | HR 90 | Temp 98.3°F | Ht 75.0 in | Wt 228.9 lb

## 2015-07-26 DIAGNOSIS — I724 Aneurysm of artery of lower extremity: Secondary | ICD-10-CM

## 2015-07-26 DIAGNOSIS — I729 Aneurysm of unspecified site: Secondary | ICD-10-CM

## 2015-07-26 DIAGNOSIS — I330 Acute and subacute infective endocarditis: Secondary | ICD-10-CM

## 2015-07-26 NOTE — Progress Notes (Signed)
    Postoperative Visit   History of Present Illness  Craig MunchJimmy F [name] is a 59 y.o. year old male who presents for postoperative follow-up for:   1.  Excision of mycotic R SFA Aneurysm, I&D thigh abcess (Date: 04/13/15).   2.  Repeat I&D R thigh abscess, VAC placement (04/15/15) 3.  Washout R thigh, VAC placement (04/17/15) 4.  Washout R thigh, VAC placement (04/19/15)  This patient's mycotic aneurysm eventually resulted in loss of both his SFA and femoral vein.  Plastic Surgery had to perform a muscle flap to fill the defect in the right thigh.  The patient's wounds are nearly healed.  The patient notes near resolution of lower extremity symptoms.  The patient is able to complete their activities of daily living.  The patient's current symptoms are: near healed right thigh incision and mild right leg swelling.  For VQI Use Only  PRE-ADM LIVING: Home  AMB STATUS: Ambulatory  Physical Examination  Filed Vitals:   07/26/15 0824  BP: 130/84  Pulse: 90  Temp: 98.3 F (36.8 C)   RLE: Right thigh incision nearly healed with some mild hypertrophic scar, Some dry eschar overlying ray amputation R 1st and 2nd toes, R foot appears viable  Medical Decision Making  Craig MunchJimmy F [name] is a [age] y.o. year old male who presents s/p excision of R SFA mycotic aneurysm, ligation of femoral vein after thrombosis and rupture, rectus abdominus muscle flap to right groin . I discussed in depth with the patient the nature of atherosclerosis, and emphasized the importance of maximal medical management including strict control of blood pressure, blood glucose, and lipid levels, obtaining regular exercise, and cessation of smoking.  The patient is aware that without maximal medical management the underlying atherosclerotic disease process will progress, limiting the benefit of any interventions. The patient's surveillance will included ABI which will be completed in: 3 months, at which time the patient will be  re-evaluated.   The patient agrees to participate in their maximal medical care and routine surveillance. I emphasized to the patient the severity of his infection in the right thigh.  The severity of scarring in the thigh along with a muscular flap in the subsequent defect make any further bypasses in the right technically difficult with possibility of disrupting the blood supply of the rectus flap.  Thank you for allowing us to participate in this patient's care.  Leonides SakeBrian Chen, MD [facility] Office: [phone] Pager: [phone]  07/26/2015, 8:42 AM

## 2015-07-26 NOTE — Addendum Note (Signed)
Addended by: Adria DillELDRIDGE-[name], Candiss Galeana L on: 07/26/2015 09:03 AM   Modules accepted: Orders

## 2015-07-30 ENCOUNTER — Encounter: Payer: Self-pay | Admitting: Infectious Disease

## 2015-08-01 ENCOUNTER — Encounter: Payer: Self-pay | Admitting: Infectious Disease

## 2015-08-04 ENCOUNTER — Inpatient Hospital Stay (HOSPITAL_COMMUNITY): Payer: Medicaid Other

## 2015-08-04 ENCOUNTER — Encounter (HOSPITAL_BASED_OUTPATIENT_CLINIC_OR_DEPARTMENT_OTHER): Payer: Self-pay | Admitting: *Deleted

## 2015-08-04 ENCOUNTER — Inpatient Hospital Stay (HOSPITAL_BASED_OUTPATIENT_CLINIC_OR_DEPARTMENT_OTHER)
Admission: EM | Admit: 2015-08-04 | Discharge: 2015-08-09 | DRG: 513 | Disposition: A | Discharge status: Home Health | Payer: Medicaid Other | Attending: Internal Medicine | Admitting: Internal Medicine

## 2015-08-04 ENCOUNTER — Emergency Department (HOSPITAL_BASED_OUTPATIENT_CLINIC_OR_DEPARTMENT_OTHER): Payer: Medicaid Other

## 2015-08-04 DIAGNOSIS — G894 Chronic pain syndrome: Secondary | ICD-10-CM | POA: Diagnosis present

## 2015-08-04 DIAGNOSIS — E876 Hypokalemia: Secondary | ICD-10-CM | POA: Diagnosis present

## 2015-08-04 DIAGNOSIS — Z8249 Family history of ischemic heart disease and other diseases of the circulatory system: Secondary | ICD-10-CM | POA: Diagnosis not present

## 2015-08-04 DIAGNOSIS — B9561 Methicillin susceptible Staphylococcus aureus infection as the cause of diseases classified elsewhere: Secondary | ICD-10-CM

## 2015-08-04 DIAGNOSIS — Z79891 Long term (current) use of opiate analgesic: Secondary | ICD-10-CM | POA: Diagnosis not present

## 2015-08-04 DIAGNOSIS — Z87891 Personal history of nicotine dependence: Secondary | ICD-10-CM

## 2015-08-04 DIAGNOSIS — I10 Essential (primary) hypertension: Secondary | ICD-10-CM | POA: Diagnosis present

## 2015-08-04 DIAGNOSIS — I824Z1 Acute embolism and thrombosis of unspecified deep veins of right distal lower extremity: Secondary | ICD-10-CM | POA: Diagnosis present

## 2015-08-04 DIAGNOSIS — M86632 Other chronic osteomyelitis, left radius and ulna: Secondary | ICD-10-CM | POA: Diagnosis present

## 2015-08-04 DIAGNOSIS — M869 Osteomyelitis, unspecified: Secondary | ICD-10-CM | POA: Diagnosis present

## 2015-08-04 DIAGNOSIS — R7881 Bacteremia: Secondary | ICD-10-CM | POA: Diagnosis present

## 2015-08-04 DIAGNOSIS — M86132 Other acute osteomyelitis, left radius and ulna: Secondary | ICD-10-CM | POA: Diagnosis present

## 2015-08-04 DIAGNOSIS — Z7902 Long term (current) use of antithrombotics/antiplatelets: Secondary | ICD-10-CM

## 2015-08-04 DIAGNOSIS — E785 Hyperlipidemia, unspecified: Secondary | ICD-10-CM | POA: Diagnosis present

## 2015-08-04 DIAGNOSIS — M00832 Arthritis due to other bacteria, left wrist: Secondary | ICD-10-CM | POA: Diagnosis present

## 2015-08-04 DIAGNOSIS — I471 Supraventricular tachycardia: Secondary | ICD-10-CM | POA: Diagnosis present

## 2015-08-04 DIAGNOSIS — Z96653 Presence of artificial knee joint, bilateral: Secondary | ICD-10-CM | POA: Diagnosis present

## 2015-08-04 DIAGNOSIS — I739 Peripheral vascular disease, unspecified: Secondary | ICD-10-CM | POA: Diagnosis present

## 2015-08-04 DIAGNOSIS — Z79899 Other long term (current) drug therapy: Secondary | ICD-10-CM | POA: Diagnosis not present

## 2015-08-04 DIAGNOSIS — Z89421 Acquired absence of other right toe(s): Secondary | ICD-10-CM | POA: Diagnosis not present

## 2015-08-04 DIAGNOSIS — M8610 Other acute osteomyelitis, unspecified site: Secondary | ICD-10-CM

## 2015-08-04 DIAGNOSIS — Z86718 Personal history of other venous thrombosis and embolism: Secondary | ICD-10-CM | POA: Diagnosis not present

## 2015-08-04 DIAGNOSIS — Z823 Family history of stroke: Secondary | ICD-10-CM | POA: Diagnosis not present

## 2015-08-04 DIAGNOSIS — Z888 Allergy status to other drugs, medicaments and biological substances status: Secondary | ICD-10-CM

## 2015-08-04 DIAGNOSIS — Z8619 Personal history of other infectious and parasitic diseases: Secondary | ICD-10-CM

## 2015-08-04 DIAGNOSIS — I272 Other secondary pulmonary hypertension: Secondary | ICD-10-CM | POA: Diagnosis present

## 2015-08-04 DIAGNOSIS — I5032 Chronic diastolic (congestive) heart failure: Secondary | ICD-10-CM | POA: Diagnosis present

## 2015-08-04 DIAGNOSIS — B9689 Other specified bacterial agents as the cause of diseases classified elsewhere: Secondary | ICD-10-CM | POA: Diagnosis not present

## 2015-08-04 LAB — CBC WITH DIFFERENTIAL/PLATELET
BASOS PCT: 0 %
Basophils Absolute: 0 10*3/uL (ref 0.0–0.1)
Eosinophils Absolute: 0.1 10*3/uL (ref 0.0–0.7)
Eosinophils Relative: 1 %
HEMATOCRIT: 41.5 % (ref 39.0–52.0)
HEMOGLOBIN: 12.9 g/dL — AB (ref 13.0–17.0)
LYMPHS ABS: 2.8 10*3/uL (ref 0.7–4.0)
LYMPHS PCT: 36 %
MCH: 26.5 pg (ref 26.0–34.0)
MCHC: 31.1 g/dL (ref 30.0–36.0)
MCV: 85.2 fL (ref 78.0–100.0)
MONO ABS: 0.9 10*3/uL (ref 0.1–1.0)
MONOS PCT: 11 %
NEUTROS ABS: 4.1 10*3/uL (ref 1.7–7.7)
NEUTROS PCT: 52 %
Platelets: 293 10*3/uL (ref 150–400)
RBC: 4.87 MIL/uL (ref 4.22–5.81)
RDW: 18.3 % — ABNORMAL HIGH (ref 11.5–15.5)
WBC: 8 10*3/uL (ref 4.0–10.5)

## 2015-08-04 LAB — URINALYSIS, ROUTINE W REFLEX MICROSCOPIC
Bilirubin Urine: NEGATIVE
Glucose, UA: NEGATIVE mg/dL
Ketones, ur: NEGATIVE mg/dL
LEUKOCYTES UA: NEGATIVE
NITRITE: NEGATIVE
PH: 5.5 (ref 5.0–8.0)
Protein, ur: NEGATIVE mg/dL
SPECIFIC GRAVITY, URINE: 1.016 (ref 1.005–1.030)

## 2015-08-04 LAB — URINE MICROSCOPIC-ADD ON: WBC, UA: NONE SEEN WBC/hpf (ref 0–5)

## 2015-08-04 LAB — PROTIME-INR
INR: 1.96 — AB (ref 0.00–1.49)
Prothrombin Time: 22.3 seconds — ABNORMAL HIGH (ref 11.6–15.2)

## 2015-08-04 LAB — I-STAT CG4 LACTIC ACID, ED: Lactic Acid, Venous: 1.59 mmol/L (ref 0.5–2.0)

## 2015-08-04 LAB — COMPREHENSIVE METABOLIC PANEL
ALBUMIN: 3.5 g/dL (ref 3.5–5.0)
ALK PHOS: 105 U/L (ref 38–126)
ALT: 12 U/L — ABNORMAL LOW (ref 17–63)
ANION GAP: 6 (ref 5–15)
AST: 14 U/L — ABNORMAL LOW (ref 15–41)
BILIRUBIN TOTAL: 0.4 mg/dL (ref 0.3–1.2)
BUN: 16 mg/dL (ref 6–20)
CALCIUM: 8.3 mg/dL — AB (ref 8.9–10.3)
CO2: 21 mmol/L — ABNORMAL LOW (ref 22–32)
Chloride: 110 mmol/L (ref 101–111)
Creatinine, Ser: 0.68 mg/dL (ref 0.61–1.24)
GFR calc Af Amer: >60 mL/min (ref >60)
GLUCOSE: 133 mg/dL — AB (ref 65–99)
Potassium: 3.3 mmol/L — ABNORMAL LOW (ref 3.5–5.1)
Sodium: 137 mmol/L (ref 135–145)
TOTAL PROTEIN: 7.2 g/dL (ref 6.5–8.1)

## 2015-08-04 LAB — TROPONIN I: Troponin I: <0.03 ng/mL (ref <0.031)

## 2015-08-04 LAB — TSH: TSH: 2.116 u[IU]/mL (ref 0.350–4.500)

## 2015-08-04 MED ORDER — SODIUM CHLORIDE 0.9 % IV SOLN: INTRAVENOUS | Status: DC

## 2015-08-04 MED ORDER — SUMATRIPTAN SUCCINATE 50 MG PO TABS: 50.0000 mg | ORAL_TABLET | Freq: Every day | ORAL | Status: DC | PRN

## 2015-08-04 MED ORDER — ONDANSETRON HCL 4 MG PO TABS: 4.0000 mg | ORAL_TABLET | Freq: Four times a day (QID) | ORAL | Status: DC | PRN

## 2015-08-04 MED ORDER — ADULT MULTIVITAMIN W/MINERALS CH
1.0000 | ORAL_TABLET | Freq: Every day | ORAL | Status: DC
  Administered 2015-08-05 – 2015-08-09 (×5): 1 via ORAL
  Filled 2015-08-04 (×5): qty 1

## 2015-08-04 MED ORDER — SODIUM CHLORIDE 0.9 % IV BOLUS (SEPSIS)
1000.0000 mL | Freq: Once | INTRAVENOUS | Status: AC
  Administered 2015-08-04: 1000 mL via INTRAVENOUS

## 2015-08-04 MED ORDER — POTASSIUM CHLORIDE CRYS ER 20 MEQ PO TBCR
40.0000 meq | EXTENDED_RELEASE_TABLET | Freq: Four times a day (QID) | ORAL | Status: AC
  Administered 2015-08-04 – 2015-08-05 (×2): 40 meq via ORAL
  Filled 2015-08-04 (×2): qty 2

## 2015-08-04 MED ORDER — MORPHINE SULFATE ER 30 MG PO TBCR
90.0000 mg | EXTENDED_RELEASE_TABLET | Freq: Two times a day (BID) | ORAL | Status: DC
Start: 2015-08-04 — End: 2015-08-09
  Administered 2015-08-04 – 2015-08-09 (×10): 90 mg via ORAL
  Filled 2015-08-04 (×5): qty 3
  Filled 2015-08-04: qty 6
  Filled 2015-08-04 (×4): qty 3

## 2015-08-04 MED ORDER — ACETAMINOPHEN 325 MG PO TABS: 650.0000 mg | ORAL_TABLET | Freq: Four times a day (QID) | ORAL | Status: DC | PRN

## 2015-08-04 MED ORDER — VANCOMYCIN HCL 10 G IV SOLR
2000.0000 mg | Freq: Once | INTRAVENOUS | Status: DC
  Filled 2015-08-04 (×2): qty 2000

## 2015-08-04 MED ORDER — TEMAZEPAM 7.5 MG PO CAPS
15.0000 mg | ORAL_CAPSULE | Freq: Every day | ORAL | Status: DC
Start: 2015-08-04 — End: 2015-08-09
  Administered 2015-08-04 – 2015-08-07 (×4): 15 mg via ORAL
  Administered 2015-08-08: 30 mg via ORAL
  Filled 2015-08-04 (×6): qty 2

## 2015-08-04 MED ORDER — POLYETHYLENE GLYCOL 3350 17 G PO PACK
17.0000 g | PACK | Freq: Every day | ORAL | Status: DC
  Filled 2015-08-04 (×4): qty 1

## 2015-08-04 MED ORDER — DOCUSATE SODIUM 100 MG PO CAPS
100.0000 mg | ORAL_CAPSULE | Freq: Two times a day (BID) | ORAL | Status: DC
  Administered 2015-08-04 – 2015-08-09 (×8): 100 mg via ORAL
  Filled 2015-08-04 (×8): qty 1

## 2015-08-04 MED ORDER — SODIUM CHLORIDE 0.9 % IJ SOLN
3.0000 mL | Freq: Two times a day (BID) | INTRAMUSCULAR | Status: DC
  Administered 2015-08-04 – 2015-08-09 (×4): 3 mL via INTRAVENOUS

## 2015-08-04 MED ORDER — ATORVASTATIN CALCIUM 80 MG PO TABS
80.0000 mg | ORAL_TABLET | Freq: Every day | ORAL | Status: DC
  Administered 2015-08-04 – 2015-08-08 (×5): 80 mg via ORAL
  Filled 2015-08-04 (×6): qty 1

## 2015-08-04 MED ORDER — MORPHINE SULFATE (PF) 4 MG/ML IV SOLN
4.0000 mg | Freq: Once | INTRAVENOUS | Status: AC
  Administered 2015-08-04: 4 mg via INTRAVENOUS
  Filled 2015-08-04: qty 1

## 2015-08-04 MED ORDER — MORPHINE SULFATE (PF) 4 MG/ML IV SOLN
4.0000 mg | Freq: Once | INTRAVENOUS | Status: AC
Start: 2015-08-04 — End: 2015-08-04
  Administered 2015-08-04: 4 mg via INTRAVENOUS
  Filled 2015-08-04: qty 1

## 2015-08-04 MED ORDER — TESTOSTERONE CYPIONATE 200 MG/ML IM SOLN: 100.0000 mg | INTRAMUSCULAR | Status: DC

## 2015-08-04 MED ORDER — DULOXETINE HCL 60 MG PO CPEP
60.0000 mg | ORAL_CAPSULE | Freq: Every day | ORAL | Status: DC
  Administered 2015-08-05 – 2015-08-09 (×5): 60 mg via ORAL
  Filled 2015-08-04 (×5): qty 1

## 2015-08-04 MED ORDER — VANCOMYCIN HCL IN DEXTROSE 1-5 GM/200ML-% IV SOLN
1000.0000 mg | Freq: Two times a day (BID) | INTRAVENOUS | Status: DC
  Administered 2015-08-04 – 2015-08-06 (×4): 1000 mg via INTRAVENOUS
  Filled 2015-08-04 (×3): qty 200

## 2015-08-04 MED ORDER — NALOXEGOL OXALATE 25 MG PO TABS
25.0000 mg | ORAL_TABLET | Freq: Every day | ORAL | Status: DC
  Administered 2015-08-05 – 2015-08-09 (×5): 25 mg via ORAL
  Filled 2015-08-04 (×6): qty 1

## 2015-08-04 MED ORDER — TRIFLURIDINE 1 % OP SOLN
1.0000 [drp] | Freq: Two times a day (BID) | OPHTHALMIC | Status: DC | PRN
  Filled 2015-08-04: qty 7.5

## 2015-08-04 MED ORDER — CEFAZOLIN SODIUM 1 G IJ SOLR
2.0000 g | Freq: Three times a day (TID) | INTRAMUSCULAR | Status: DC
  Administered 2015-08-05 – 2015-08-09 (×14): 2 g via INTRAVENOUS
  Filled 2015-08-04 (×17): qty 20

## 2015-08-04 MED ORDER — HEPARIN (PORCINE) IN NACL 100-0.45 UNIT/ML-% IJ SOLN
2200.0000 [IU]/h | INTRAMUSCULAR | Status: DC
  Administered 2015-08-04 – 2015-08-05 (×2): 1700 [IU]/h via INTRAVENOUS
  Administered 2015-08-06 – 2015-08-07 (×3): 1900 [IU]/h via INTRAVENOUS
  Filled 2015-08-04 (×5): qty 250

## 2015-08-04 MED ORDER — ONDANSETRON HCL 4 MG/2ML IJ SOLN
4.0000 mg | Freq: Four times a day (QID) | INTRAMUSCULAR | Status: DC | PRN
  Administered 2015-08-05 – 2015-08-08 (×2): 4 mg via INTRAVENOUS
  Filled 2015-08-04: qty 2

## 2015-08-04 MED ORDER — ACETAMINOPHEN 650 MG RE SUPP: 650.0000 mg | Freq: Four times a day (QID) | RECTAL | Status: DC | PRN

## 2015-08-04 MED ORDER — CEFAZOLIN SODIUM-DEXTROSE 2-3 GM-% IV SOLR: 2.0000 g | Freq: Three times a day (TID) | INTRAVENOUS | Status: DC

## 2015-08-04 MED ORDER — VENLAFAXINE HCL ER 75 MG PO CP24
75.0000 mg | ORAL_CAPSULE | Freq: Every day | ORAL | Status: DC
  Administered 2015-08-05 – 2015-08-09 (×5): 75 mg via ORAL
  Filled 2015-08-04 (×6): qty 1

## 2015-08-04 MED ORDER — ALBUTEROL SULFATE (2.5 MG/3ML) 0.083% IN NEBU
2.5000 mg | INHALATION_SOLUTION | Freq: Four times a day (QID) | RESPIRATORY_TRACT | Status: DC | PRN
Start: 2015-08-04 — End: 2015-08-09

## 2015-08-04 MED ORDER — GADOBENATE DIMEGLUMINE 529 MG/ML IV SOLN
20.0000 mL | Freq: Once | INTRAVENOUS | Status: AC | PRN
  Administered 2015-08-04: 20 mL via INTRAVENOUS

## 2015-08-04 MED ORDER — CEFAZOLIN SODIUM 1-5 GM-% IV SOLN
INTRAVENOUS | Status: AC
  Administered 2015-08-04: 2000 mg
  Filled 2015-08-04: qty 100

## 2015-08-04 MED ORDER — VANCOMYCIN HCL IN DEXTROSE 1-5 GM/200ML-% IV SOLN
INTRAVENOUS | Status: AC
  Administered 2015-08-04: 1000 mg via INTRAVENOUS
  Filled 2015-08-04: qty 400

## 2015-08-04 MED ORDER — HYDROMORPHONE HCL 1 MG/ML IJ SOLN
2.0000 mg | INTRAMUSCULAR | Status: DC | PRN
  Administered 2015-08-04 – 2015-08-06 (×7): 2 mg via INTRAVENOUS
  Filled 2015-08-04 (×7): qty 2

## 2015-08-04 MED ORDER — OXYCODONE HCL 5 MG PO TABS
30.0000 mg | ORAL_TABLET | Freq: Four times a day (QID) | ORAL | Status: DC
Start: 2015-08-04 — End: 2015-08-09
  Administered 2015-08-05 – 2015-08-09 (×19): 30 mg via ORAL
  Filled 2015-08-04 (×20): qty 6

## 2015-08-04 MED ORDER — GABAPENTIN 100 MG PO CAPS
100.0000 mg | ORAL_CAPSULE | Freq: Three times a day (TID) | ORAL | Status: DC
  Administered 2015-08-04 – 2015-08-09 (×12): 100 mg via ORAL
  Filled 2015-08-04 (×12): qty 1

## 2015-08-04 MED ORDER — CLONAZEPAM 1 MG PO TABS
1.0000 mg | ORAL_TABLET | Freq: Three times a day (TID) | ORAL | Status: DC | PRN
  Administered 2015-08-09: 1 mg via ORAL
  Filled 2015-08-04: qty 1

## 2015-08-04 MED ORDER — FLECAINIDE ACETATE 100 MG PO TABS
100.0000 mg | ORAL_TABLET | Freq: Two times a day (BID) | ORAL | Status: DC
  Administered 2015-08-04 – 2015-08-09 (×10): 100 mg via ORAL
  Filled 2015-08-04 (×12): qty 1

## 2015-08-04 MED ORDER — METOPROLOL TARTRATE 75 MG PO TABS: 75.0000 mg | ORAL_TABLET | Freq: Two times a day (BID) | ORAL | Status: DC

## 2015-08-04 MED ORDER — ALBUTEROL SULFATE HFA 108 (90 BASE) MCG/ACT IN AERS: 1.0000 | INHALATION_SPRAY | Freq: Four times a day (QID) | RESPIRATORY_TRACT | Status: DC | PRN

## 2015-08-04 MED ORDER — SUMATRIPTAN SUCCINATE 50 MG PO TABS
50.0000 mg | ORAL_TABLET | Freq: Every day | ORAL | Status: DC | PRN
  Filled 2015-08-04: qty 1

## 2015-08-04 MED ORDER — METHOCARBAMOL 500 MG PO TABS
500.0000 mg | ORAL_TABLET | Freq: Four times a day (QID) | ORAL | Status: DC | PRN
  Administered 2015-08-08: 500 mg via ORAL
  Filled 2015-08-04: qty 1

## 2015-08-04 MED ORDER — METOPROLOL TARTRATE 50 MG PO TABS
75.0000 mg | ORAL_TABLET | Freq: Two times a day (BID) | ORAL | Status: DC
  Administered 2015-08-04 – 2015-08-09 (×8): 75 mg via ORAL
  Filled 2015-08-04 (×9): qty 1

## 2015-08-04 MED ORDER — DEXTROSE 5 % IV SOLN: 2.0000 g | Freq: Once | INTRAVENOUS | Status: DC

## 2015-08-04 NOTE — Progress Notes (Signed)
ANTICOAGULATION CONSULT NOTE - Initial Consult  Pharmacy Consult for heparin Indication: DVT (holding warfarin)  Allergies  Allergen Reactions  . Verapamil Other (See Comments)    Causes pvc's  . Imitrex [Sumatriptan] Other (See Comments)    Chest pain, 2004 (tolerates 50 mg prn)    Patient Measurements: Height: 6\' 3"  (190.5 cm) Weight: 220 lb (99.791 kg) IBW/kg (Calculated) : 84.5 Heparin Dosing Weight: 99 kg  Vital Signs: Temp: 98.4 F (36.9 C) (01/08 1740) Temp Source: Oral (01/08 1740) BP: 140/90 mmHg (01/08 1740) Pulse Rate: 102 (01/08 1740)  Labs:  Recent Labs  08/04/15 1028 08/04/15 1905  HGB 12.9*  --   HCT 41.5  --   PLT 293  --   LABPROT  --  22.3*  INR  --  1.96*  CREATININE 0.68  --   TROPONINI <0.03  --     Estimated Creatinine Clearance: 121.8 mL/min (by C-G formula based on Cr of 0.68).  Assessment: 60 [facility] with left wrist pain with swelling.   PMH: SVT, dHF, Pulm HTN, hx of DVT on warfarin  AC: warfarin pta. Heparin to initiate in event surgery is needed. Admit INR 1.96  PTA warfarin is (med rec pending)  Renal: SCr 0.68  Heme: H&H 12.9/41.5, Plt 293  Goal of Therapy:  Heparin level 0.3-0.7 units/ml Monitor platelets by anticoagulation protocol: Yes   Plan:  Heparin 1700 units/hr Daily HL, CBC F/u renal function, C&S, LOT, and [facility]  Isaac BlissMichael Tamikka Pilger, PharmD, BCPS, [facility] Clinical Pharmacist Pager [phone] 08/04/2015 7:52 PM

## 2015-08-04 NOTE — ED Notes (Signed)
Presents with left wrist pain, noted to red in color and swelling noted, tender to touch

## 2015-08-04 NOTE — ED Notes (Signed)
Phone Hand Off report provided to Katie-RN as Engineer, structuralrec RN [facility]

## 2015-08-04 NOTE — ED Notes (Signed)
Patient transported to X-ray via stretcher, sr x 2 up 

## 2015-08-04 NOTE — ED Notes (Signed)
IVF NS bolus placed on infusion pump

## 2015-08-04 NOTE — ED Provider Notes (Signed)
CSN: [id number]     Arrival date & time 08/04/15  1002 History   First MD Initiated Contact with Patient 08/04/15 1019     Chief Complaint  Patient presents with  . Wrist Pain  . Tachycardia     (Consider location/radiation/quality/duration/timing/severity/associated sxs/prior Treatment) HPI   Patient is a 60 year old male presenting with left wrist pain. 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  Patient returned home 3 weeks ago on a 6 course of Ancef through his PICC line.  Unfortunately patient had been feeling improved but the last 2 days is been feeling "off". On arrival. Patient is tachycardic. He has wrist pain to his left wrist. It has overlying erythema. He has exquisite tenderness with range of motion.  Past Medical History  Diagnosis Date  . Hypertension   . Chronic knee pain   . Hyperlipemia   . PUD (peptic ulcer disease)   . Dysrhythmia   . Peripheral vascular disease (HCC)   . Anxiety   . GERD (gastroesophageal reflux disease)   . 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 Wound of right ankle 06/05/2015   Past Surgical History  Procedure Laterality Date  . I&d extremity Left 07/12/2013    Procedure: I&D Left Thigh Abscess;  Surgeon: Toni ArthursJohn [name], MD;  [facility];  Service: Orthopedics;  Laterality: Left;  . Replacement total knee bilateral    . Tonsillectomy    . Hernia repair    . Cholecystectomy    . Appendectomy    . Shoulder surgery    . Elbow surgery    . 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 Incision and drainage abscess Right 04/11/2015    Procedure: INCISION AND DRAINAGE OF RIGHT THIGH ABSCESS;  Surgeon: Fransisco HertzBrian L Chen, MD;   [facility];  Service: Vascular;  Laterality: Right;  . Incision and drainage abscess Right 04/13/2015    Procedure: INCISION AND DRAINAGE THIGH ABSCESS;  Surgeon: Fransisco HertzBrian L Chen, MD;  [facility];  Service: Vascular;  Laterality: Right;  . I&d extremity Left 04/17/2015    Procedure: IRRIGATION AND DEBRIDEMENT LEFT EXTREMITY;  Surgeon: Fransisco HertzBrian L Chen, MD;  [facility];  Service: Vascular;  Laterality: Left;  Marland Kitchen. Muscle flap closure Right 04/19/2015    Procedure: RIGHT RECTUS ABDOMINUS  FLAP TO RIGHT GROIN;  Surgeon: Glenna FellowsBrinda Thimmappa, MD;  Location: [facility] OR;  Service: Plastics;  Laterality: Right;  . Femoral revision Right 04/19/2015    Procedure: OVERSEW RIGHT FEMORAL VEIN ;  Surgeon: Chuck Hinthristopher S Dickson, MD;  [facility];  Service: Vascular;  Laterality: Right;  . Wound exploration Right 04/23/2015    Procedure: EXAM UNDER ANESTHESIA AND VAC CHANGE RIGHT THIGH;  Surgeon: Glenna FellowsBrinda Thimmappa, MD;  Location: [facility] OR;  Service: Plastics;  Laterality: Right;  . Amputation Right 05/17/2015    Procedure: Right 1st and 2nd Toe Amputation;  Surgeon: Nadara MustardMarcus Duda V, MD;  [facility];  Service: Orthopedics;  Laterality: Right;  . I&d extremity Left 07/09/2015    Procedure: IRRIGATION AND DEBRIDEMENT LEFT WRIST;  Surgeon: Knute NeuHarrill Coley, MD;  Location: [facility] OR;  Service: Plastics;  Laterality: Left;   Family History  Problem Relation Age of Onset  . Heart  attack Paternal Grandmother 81  . Heart disease Mother 22    arrythmia  . Stroke Paternal Grandfather   . Stroke Maternal Grandfather    Social History  Substance Use Topics  . Smoking status: Former Smoker    Types: Cigarettes  . Smokeless tobacco: Never Used  . Alcohol Use: No    Review of Systems  Constitutional: Positive for fatigue. Negative for fever and activity change.  HENT: Negative for congestion.   Respiratory: Negative for shortness of breath.   Cardiovascular: Negative for chest pain.  Gastrointestinal: Negative for  abdominal pain.  Genitourinary: Negative for dysuria.  Musculoskeletal: Positive for joint swelling.  Neurological: Negative for dizziness.  Psychiatric/Behavioral: Negative for agitation.  All other systems reviewed and are negative.     Allergies  Verapamil and Imitrex  Home Medications   Prior to Admission medications   Medication Sig Start Date End Date Taking? Authorizing Provider  albuterol (PROAIR HFA) 108 (90 BASE) MCG/ACT inhaler Inhale 1 puff into the lungs every 6 (six) hours as needed for wheezing or shortness of breath.  09/06/13  Yes Historical Provider, MD  atorvastatin (LIPITOR) 80 MG tablet Take 1 tablet (80 mg total) by mouth daily. 05/24/15  Yes Daniel J Angiulli, PA-C  ceFAZolin (ANCEF) 2-3 GM-% SOLR Inject 50 mLs (2 g total) into the vein every 8 (eight) hours. For 6 weeks. Preliminary End date 08/22/2015. Stop ONLY IF INSTRUCTED by ID PHYSICIAN 07/11/15  Yes Costin Otelia Sergeant, MD  Diclofenac-Misoprostol 75-0.2 MG TBEC  06/18/15  Yes Historical Provider, MD  docusate sodium (COLACE) 100 MG capsule Take 1 capsule (100 mg total) by mouth 2 (two) times daily. 05/24/15  Yes Daniel J Angiulli, PA-C  flecainide (TAMBOCOR) 100 MG tablet Take 1 tablet (100 mg total) by mouth every 12 (twelve) hours. 05/24/15  Yes Daniel J Angiulli, PA-C  gabapentin (NEURONTIN) 100 MG capsule Take 1 capsule (100 mg total) by mouth 3 (three) times daily. 05/24/15  Yes Daniel J Angiulli, PA-C  Metoprolol Tartrate 75 MG TABS Take 75 mg by mouth 2 (two) times daily. 05/24/15  Yes Daniel J Angiulli, PA-C  morphine (MS CONTIN) 30 MG 12 hr tablet Take 3 tablets (90 mg total) by mouth every 12 (twelve) hours. 05/24/15  Yes Daniel J Angiulli, PA-C  morphine (MS CONTIN) 60 MG 12 hr tablet  07/11/15  Yes Historical Provider, MD  Multiple Vitamin (MULTIVITAMIN WITH MINERALS) TABS tablet Take 1 tablet by mouth daily. 05/24/15  Yes Daniel J Angiulli, PA-C  naloxegol oxalate (MOVANTIK) 25 MG TABS tablet Take 1  tablet (25 mg total) by mouth daily before breakfast. 05/24/15  Yes Mcarthur Rossetti Angiulli, PA-C  naproxen (NAPROSYN) 500 MG tablet Take 1 tablet (500 mg total) by mouth 2 (two) times daily with a meal. 05/24/15  Yes Daniel J Angiulli, PA-C  oxycodone (ROXICODONE) 30 MG immediate release tablet Take 1 tablet (30 mg total) by mouth every 6 (six) hours. scheduled 05/24/15  Yes Daniel J Angiulli, PA-C  SUMAtriptan (IMITREX) 50 MG tablet Take 50 mg by mouth daily as needed for migraine or headache. Maximum 2 doses in 2 days 02/15/15  Yes Historical Provider, MD  temazepam (RESTORIL) 15 MG capsule Take 15-30 mg by mouth at bedtime. 06/25/15  Yes Historical Provider, MD  trifluridine (VIROPTIC) 1 % ophthalmic solution Place 1 drop into both eyes 2 (two) times daily as needed (breakouts (blurry, matted eyelids)).   Yes Historical Provider, MD  venlafaxine XR (EFFEXOR-XR) 75 MG 24 hr capsule Take  1 capsule (75 mg total) by mouth daily with breakfast. 05/24/15  Yes Mcarthur Rossetti Angiulli, PA-C  warfarin (COUMADIN) 5 MG tablet 1 tablet Mondays and Thursdays. 1-1/2 tablet Sunday Tuesday Wednesday Friday Saturday Patient taking differently: Take 10-10.5 mg by mouth daily at 6 PM. Take 10 mg by mouth on Monday, Wednesday, Thursday, Saturday, and Sunday. Take 10.5mg  on Tuesday and Thursday. 05/24/15  Yes Daniel J Angiulli, PA-C  DULoxetine (CYMBALTA) 30 MG capsule Take 60 mg by mouth daily at 12 noon. 06/25/15   Historical Provider, MD  famciclovir (FAMVIR) 500 MG tablet Take 500 mg by mouth 2 (two) times daily as needed (fever blisters).  10/25/13   Historical Provider, MD  testosterone cypionate (DEPOTESTOSTERONE CYPIONATE) 200 MG/ML injection Inject 100 mg into the muscle every 14 (fourteen) days. Reported on 08/05/2015 06/25/15   Historical Provider, MD   BP 102/71 mmHg  Pulse 65  Temp(Src) 97.7 F (36.5 C) (Oral)  Resp 19  Ht 6\' 3"  (1.905 m)  Wt 220 lb (99.791 kg)  BMI 27.50 kg/m2  SpO2 100% Physical Exam   Constitutional: He is oriented to person, place, and time. He appears well-nourished.  HENT:  Head: Normocephalic.  Mouth/Throat: Oropharynx is clear and moist.  Eyes: Conjunctivae are normal.  Neck: No tracheal deviation present.  Cardiovascular: Normal rate.   No murmur heard. No murmur.  Pulmonary/Chest: Effort normal. No stridor. No respiratory distress.  PICC line in place no surrounding erythema.  Abdominal: Soft. There is no tenderness. There is no guarding.  Musculoskeletal: Normal range of motion. He exhibits no edema.  Patient has mild erythema overlying left wrist with exquisite tenderness to range of motion.  Neurological: He is oriented to person, place, and time. No cranial nerve deficit.  Skin: Skin is warm and dry. No rash noted. He is not diaphoretic.  Tiny punctate lesions on the finger pads of the left hand. ?janeway  lesions.  Psychiatric: He has a normal mood and affect. His behavior is normal.  Nursing note and vitals reviewed.   ED Course  Procedures (including critical care time) Labs Review Labs Reviewed  CBC WITH DIFFERENTIAL/PLATELET - Abnormal; Notable for the following:    Hemoglobin 12.9 (*)    RDW 18.3 (*)    All other components within normal limits  COMPREHENSIVE METABOLIC PANEL - Abnormal; Notable for the following:    Potassium 3.3 (*)    CO2 21 (*)    Glucose, Bld 133 (*)    Calcium 8.3 (*)    AST 14 (*)    ALT 12 (*)    All other components within normal limits  URINALYSIS, ROUTINE W REFLEX MICROSCOPIC (NOT AT [facility]) - Abnormal; Notable for the following:    Hgb urine dipstick MODERATE (*)    All other components within normal limits  URINE MICROSCOPIC-ADD ON - Abnormal; Notable for the following:    Squamous Epithelial / LPF 0-5 (*)    Bacteria, UA MANY (*)    All other components within normal limits  PROTIME-INR - Abnormal; Notable for the following:    Prothrombin Time 18.4 (*)    INR 1.52 (*)    All other components within  normal limits  HEMOGLOBIN A1C - Abnormal; Notable for the following:    Hgb A1c MFr Bld 5.8 (*)    All other components within normal limits  BASIC METABOLIC PANEL - Abnormal; Notable for the following:    Potassium 5.2 (*)    All other components within normal limits  PROTIME-INR - Abnormal; Notable for the following:    Prothrombin Time 22.3 (*)    INR 1.96 (*)    All other components within normal limits  HEPARIN LEVEL (UNFRACTIONATED) - Abnormal; Notable for the following:    Heparin Unfractionated <0.10 (*)    All other components within normal limits  BASIC METABOLIC PANEL - Abnormal; Notable for the following:    Sodium 133 (*)    Chloride 99 (*)    Glucose, Bld 117 (*)    All other components within normal limits  BASIC METABOLIC PANEL - Abnormal; Notable for the following:    Glucose, Bld 108 (*)    All other components within normal limits  CBC - Abnormal; Notable for the following:    Hemoglobin 12.8 (*)    RDW 17.5 (*)    All other components within normal limits  PROTIME-INR - Abnormal; Notable for the following:    Prothrombin Time 15.5 (*)    All other components within normal limits  BASIC METABOLIC PANEL - Abnormal; Notable for the following:    Sodium 134 (*)    Chloride 100 (*)    Glucose, Bld 104 (*)    All other components within normal limits  HEPARIN LEVEL (UNFRACTIONATED) - Abnormal; Notable for the following:    Heparin Unfractionated 0.14 (*)    All other components within normal limits  CBC - Abnormal; Notable for the following:    Hemoglobin 11.6 (*)    HCT 37.8 (*)    RDW 17.1 (*)    All other components within normal limits  CULTURE, BLOOD (SINGLE)  URINE CULTURE  CULTURE, BLOOD (ROUTINE X 2)  CULTURE, BLOOD (ROUTINE X 2)  SURGICAL PCR SCREEN  TROPONIN I  TSH  MAGNESIUM  VANCOMYCIN, TROUGH  HEPARIN LEVEL (UNFRACTIONATED)  HEPARIN LEVEL (UNFRACTIONATED)  I-STAT CG4 LACTIC ACID, ED    Imaging Review No results found. I have  personally reviewed and evaluated these images and lab results as part of my medical decision-making.   EKG Interpretation   Date/Time:  Sunday August 04 2015 11:36:52 EST Ventricular Rate:  108 PR Interval:  171 QRS Duration: 95 QT Interval:  325 QTC Calculation: 436 R Axis:   -19 Text Interpretation:  Sinus tachycardia Borderline left axis deviation  RSR' in V1 or V2, probably normal variant ST elevation, consider inferior  injury ED PHYSICIAN INTERPRETATION AVAILABLE IN [facility] Confirmed  by TEST, Record ([id number]) on 08/05/2015 6:44:23 AM      MDM   Final diagnoses:  Osteomyelitis of left wrist [facility])    Patient is a 60 year old male with recent prolonged hospitalization significant for sepsis, osteomyelitis, endocarditis. He is presenting today with recurrent left wrist pain. This is a resident already been cleared out by orthopedics after osteomyelitis. Patient's been on several week course of Ancef since discharge home. 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 We've older to this cultures, lactate, chest x-ray.  Patient will require inpatient admission for echo, orthopedic consult for left wrist osteomyelitis and likely ID consult.  Given patient's history and high risk for osteomyelitis/endocarditis, I will start him on antibiotics    Courteney Randall An, MD 08/07/15 1501

## 2015-08-04 NOTE — ED Notes (Signed)
[facility] contacted @ 15:06 for transport

## 2015-08-04 NOTE — ED Notes (Signed)
Left wrist pain, with swelling, red in color for the past two days ago, states has had surgery on left wrist

## 2015-08-04 NOTE — Progress Notes (Signed)
ANTIBIOTIC CONSULT NOTE - INITIAL  Pharmacy Consult for Vancomycin and cefazolin Indication: osteomyelitis  Allergies  Allergen Reactions  . Imitrex [Sumatriptan] Other (See Comments)    Chest pain, 2004 (tolerates 50 mg prn)  . Verapamil Other (See Comments)    Causes pvc's    Patient Measurements: Height: 6\' 3"  (190.5 cm) Weight: 220 lb (99.791 kg) IBW/kg (Calculated) : 84.5  Vital Signs: Temp: 97.8 F (36.6 C) (01/08 1007) Temp Source: Oral (01/08 1007) BP: 134/81 mmHg (01/08 1145) Pulse Rate: 120 (01/08 1145) Intake/Output from previous day:   Intake/Output from this shift: Total I/O In: 1000 [IV Piggyback:1000] Out: -   Labs:  Recent Labs  08/04/15 1028  WBC 8.0  HGB 12.9*  PLT 293  CREATININE 0.68   Estimated Creatinine Clearance: 121.8 mL/min (by C-G formula based on Cr of 0.68). No results for input(s): VANCOTROUGH, VANCOPEAK, VANCORANDOM, GENTTROUGH, GENTPEAK, GENTRANDOM, TOBRATROUGH, TOBRAPEAK, TOBRARND, AMIKACINPEAK, AMIKACINTROU, AMIKACIN in the last 72 hours.   Microbiology: Recent Results (from the past 720 hour(s))  Surgical pcr screen     Status: None   Collection Time: 07/08/15  5:27 PM  Result Value Ref Range Status   MRSA, PCR NEGATIVE NEGATIVE Final   Staphylococcus aureus NEGATIVE NEGATIVE Final    Comment:        The Xpert SA Assay (FDA approved for NASAL specimens in patients over 58 years of age), is one component of a comprehensive surveillance program.  Test performance has been validated by [facility] for patients greater than or equal to 761 year old. It is not intended to diagnose infection nor to guide or monitor treatment.   Anaerobic culture     Status: None   Collection Time: 07/09/15  2:19 PM  Result Value Ref Range Status   Specimen Description TISSUE LEFT WRIST  Final   Special Requests SPEC A IN CUP  Final   Gram Stain   Final    RARE WBC PRESENT, PREDOMINANTLY MONONUCLEAR NO ORGANISMS SEEN Performed at  [facility]    Culture   Final    NO ANAEROBES ISOLATED Performed at [facility]    Report Status 07/15/2015 FINAL  Final  Fungus Culture with Smear     Status: None (Preliminary result)   Collection Time: 07/09/15  2:19 PM  Result Value Ref Range Status   Specimen Description TISSUE WRIST LEFT  Final   Special Requests SPEC A IN CUP  Final   Fungal Smear   Final    NO YEAST OR FUNGAL ELEMENTS SEEN Performed at [facility]    Culture   Final    CULTURE IN PROGRESS FOR FOUR WEEKS Performed at [facility]    Report Status PENDING  Incomplete  Tissue culture     Status: None   Collection Time: 07/09/15  2:19 PM  Result Value Ref Range Status   Specimen Description TISSUE WRIST LEFT  Final   Special Requests SPEC A IN CUP  Final   Gram Stain   Final    RARE WBC PRESENT, PREDOMINANTLY MONONUCLEAR NO ORGANISMS SEEN Performed at [facility]    Culture   Final    BACILLUS SPECIES Note: Standardized susceptibility testing for this organism is not available. Performed at [facility]    Report Status 07/13/2015 FINAL  Final  AFB culture with smear     Status: None (Preliminary result)   Collection Time: 07/09/15  2:19 PM  Result Value Ref Range Status  Specimen Description TISSUE WRIST LEFT  Final   Special Requests SPEC A IN CUP  Final   Acid Fast Smear   Final    NO ACID FAST BACILLI SEEN Performed at [facility]    Culture   Final    CULTURE WILL BE EXAMINED FOR 6 WEEKS BEFORE ISSUING A FINAL REPORT Performed at [facility]    Report Status PENDING  Incomplete  Anaerobic culture     Status: None   Collection Time: 07/09/15  2:50 PM  Result Value Ref Range Status   Specimen Description WOUND LEFT WRIST  Final   Special Requests SPEC B ON SWAB  Final   Gram Stain   Final    RARE WBC PRESENT, PREDOMINANTLY MONONUCLEAR NO ORGANISMS SEEN Performed at [facility]    Culture   Final    NO  ANAEROBES ISOLATED Performed at [facility]    Report Status 07/14/2015 FINAL  Final  Body fluid culture     Status: None   Collection Time: 07/09/15  2:56 PM  Result Value Ref Range Status   Specimen Description FLUID  Final   Special Requests SPEC C ULNA FLUID ON SWAB  Final   Gram Stain   Final    FEW WBC PRESENT, PREDOMINANTLY PMN NO SQUAMOUS EPITHELIAL CELLS SEEN NO ORGANISMS SEEN Performed at [facility]    Culture NO GROWTH 3 DAYS  Final   Report Status 07/13/2015 FINAL  Final  Wound culture     Status: None   Collection Time: 07/09/15  4:50 PM  Result Value Ref Range Status   Specimen Description WOUND LEFT WRIST  Final   Special Requests SPEC B ON SWAB  Final   Gram Stain   Final    FEW WBC PRESENT, PREDOMINANTLY PMN NO SQUAMOUS EPITHELIAL CELLS SEEN NO ORGANISMS SEEN Performed at [facility]    Culture   Final    NO GROWTH 2 DAYS Performed at [facility]    Report Status 07/12/2015 FINAL  Final    Medical History: Past Medical History  Diagnosis Date  . Hypertension   . Chronic knee pain   . Hyperlipemia   . PUD (peptic ulcer disease)   . Dysrhythmia   . Peripheral vascular disease (HCC)   . Anxiety   . GERD (gastroesophageal reflux disease)   . 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 Wound of right ankle 06/05/2015   Assessment: 60 y/o M at [facility] ED on 01/08 with left wrist pain with swelling. Pt on cefazolin PTA for osteomyelitis and received last dose of cefazolin at 0700 on 01/08. Pharmacy consulted to continue cefazolin and start vancomycin for osteo. Afeb, WBC WNL, CrCl > 100 mL/min. Plans to transfer patient to [facility]   Goal of Therapy:  Vancomycin trough level 15-20 mcg/ml  Plan:  Cefazolin IV 2g q8h Vancomycin IV 2000mg  x 1, then Vancomycin 1000mg  q12h F/u renal function, C&S, LOT, and VT at [facility]  Sandi Carne, PharmD Pharmacy Resident Pager:  [phone] 08/04/2015,12:01 PM

## 2015-08-04 NOTE — H&P (Signed)
[facility] History and Physical  ASHELY JOSHUA [name]:[id number] DOB: [birth year] DOA: 08/04/2015  Referring physician: [facility] PCP: Craig Mane, MD   Chief Complaint: Left wrist pain  HPI: [name] is a 60 y.o. male with extensive past medical history including SVT, chronic diastolic CHF and pulmonary hypertension, recent multiple admission to the hospital with MSSA bacteremia, right femoral artery mycotic aneurysm and left wrist osteomyelitis. Patient was seen by [name] of hand surgery and discharged home on cefazolin, patient came back to the hospital because increased pain for the past 2 days, he said he couldn't sleep because of the pain in his left wrist, noticed also mild erythema and increased pain with movement. In the ED x-ray of the wrist was done showed extensive erosive changes in the ulnar margin of the distal left radius compatible with previous infection no progression since previous exam. Does not have fever or leukocytosis, admitted to the hospital for further evaluation.  Review of Systems:  Constitutional: negative for anorexia, fevers and sweats Eyes: negative for irritation, redness and visual disturbance Ears, nose, mouth, throat, and face: negative for earaches, epistaxis, nasal congestion and sore throat Respiratory: negative for cough, dyspnea on exertion, sputum and wheezing Cardiovascular: negative for chest pain, dyspnea, lower extremity edema, orthopnea, palpitations and syncope Gastrointestinal: negative for abdominal pain, constipation, diarrhea, melena, nausea and vomiting Genitourinary:negative for dysuria, frequency and hematuria Hematologic/lymphatic: negative for bleeding, easy bruising and lymphadenopathy Musculoskeletal: Increased left wrist pain Neurological: negative for coordination problems, gait problems, headaches and weakness Endocrine: negative for diabetic symptoms including polydipsia, polyuria and weight  loss Allergic/Immunologic: negative for anaphylaxis, hay fever and urticaria  Past Medical History  Diagnosis Date  . Hypertension   . Chronic knee pain   . Hyperlipemia   . PUD (peptic ulcer disease)   . Dysrhythmia   . Peripheral vascular disease (HCC)   . Anxiety   . GERD (gastroesophageal reflux disease)   . 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 Wound of right ankle 06/05/2015   Past Surgical History  Procedure Laterality Date  . I&d extremity Left 07/12/2013    Procedure: I&D Left Thigh Abscess;  Surgeon: [name], MD;  Location: [facility] OR;  Service: Orthopedics;  Laterality: Left;  . Replacement total knee bilateral    . Tonsillectomy    . Hernia repair    . Cholecystectomy    . Appendectomy    . Shoulder surgery    . Elbow surgery    . Femoral artery exploration Right 04/11/2015    Procedure: Resection of infected right femoral artery aneurysm;  Surgeon: Fransisco Hertz, MD;  Location: [facility] OR;  Service: Vascular;  Laterality: Right;  . Incision and drainage abscess Right 04/11/2015    Procedure: INCISION AND DRAINAGE OF RIGHT THIGH ABSCESS;  Surgeon: Fransisco Hertz, MD;  Location: [facility] OR;  Service: Vascular;  Laterality: Right;  . Incision and drainage abscess Right 04/13/2015    Procedure: INCISION AND DRAINAGE THIGH ABSCESS;  Surgeon: Fransisco Hertz, MD;  Location: [facility] OR;  Service: Vascular;  Laterality: Right;  . I&d extremity Left 04/17/2015    Procedure: IRRIGATION AND DEBRIDEMENT LEFT EXTREMITY;  Surgeon: Fransisco Hertz, MD;  Location: [facility] OR;  Service: Vascular;  Laterality: Left;  Marland Kitchen Muscle flap closure Right 04/19/2015    Procedure: RIGHT RECTUS ABDOMINUS  FLAP TO RIGHT GROIN;  Surgeon: Glenna Fellows, MD;  Location: [facility] OR;  Service: Plastics;  Laterality: Right;  . Femoral revision Right 04/19/2015    Procedure: OVERSEW RIGHT FEMORAL VEIN ;  Surgeon: Chuck Hint, MD;  Location: [facility] OR;  Service: Vascular;  Laterality:  Right;  . Wound exploration Right 04/23/2015    Procedure: EXAM UNDER ANESTHESIA AND VAC CHANGE RIGHT THIGH;  Surgeon: Glenna Fellows, MD;  Location: [facility] OR;  Service: Plastics;  Laterality: Right;  . Amputation Right 05/17/2015    Procedure: Right 1st and 2nd Toe Amputation;  Surgeon: Nadara Mustard, MD;  Location: [facility] OR;  Service: Orthopedics;  Laterality: Right;  . I&d extremity Left 07/09/2015    Procedure: IRRIGATION AND DEBRIDEMENT LEFT WRIST;  Surgeon: Knute Neu, MD;  Location: [facility] OR;  Service: Plastics;  Laterality: Left;   Social History:   reports that he has quit smoking. His smoking use included Cigarettes. He has never used smokeless tobacco. He reports that he does not drink alcohol or use illicit drugs.  Allergies  Allergen Reactions  . Imitrex [Sumatriptan] Other (See Comments)    Chest pain, 2004 (tolerates 50 mg prn)  . Verapamil Other (See Comments)    Causes pvc's    Family History  Problem Relation Age of Onset  . Heart attack Paternal Grandmother 28  . Heart disease Mother 37    arrythmia  . Stroke Paternal Grandfather   . Stroke Maternal Grandfather      Prior to Admission medications   Medication Sig Start Date End Date Taking? Authorizing Provider  albuterol (PROAIR HFA) 108 (90 BASE) MCG/ACT inhaler Inhale 1 puff into the lungs every 6 (six) hours as needed for wheezing or shortness of breath.  09/06/13   Historical Provider, MD  atenolol (TENORMIN) 100 MG tablet  05/20/15   Historical Provider, MD  atorvastatin (LIPITOR) 80 MG tablet Take 1 tablet (80 mg total) by mouth daily. 05/24/15   Mcarthur Rossetti Angiulli, PA-C  ceFAZolin (ANCEF) 2-3 GM-% SOLR Inject 50 mLs (2 g total) into the vein every 8 (eight) hours. For 6 weeks. Preliminary End date 08/22/2015. Stop ONLY IF INSTRUCTED by ID PHYSICIAN 07/11/15   Costin Otelia Sergeant, MD  clonazePAM Scarlette Calico) 2 MG tablet  04/26/15   Historical Provider, MD  Diclofenac-Misoprostol 75-0.2 MG TBEC  06/18/15   Historical  Provider, MD  docusate sodium (COLACE) 100 MG capsule Take 1 capsule (100 mg total) by mouth 2 (two) times daily. 05/24/15   Mcarthur Rossetti Angiulli, PA-C  DULoxetine (CYMBALTA) 30 MG capsule Take 60 mg by mouth daily at 12 noon. 06/25/15   Historical Provider, MD  enoxaparin (LOVENOX) 100 MG/ML injection Inject 1.5 mLs (150 mg total) into the skin daily. 07/11/15   Costin Otelia Sergeant, MD  famciclovir (FAMVIR) 500 MG tablet Take 500 mg by mouth 2 (two) times daily as needed (fever blisters).  10/25/13   Historical Provider, MD  flecainide (TAMBOCOR) 100 MG tablet Take 1 tablet (100 mg total) by mouth every 12 (twelve) hours. 05/24/15   Mcarthur Rossetti Angiulli, PA-C  gabapentin (NEURONTIN) 100 MG capsule Take 1 capsule (100 mg total) by mouth 3 (three) times daily. 05/24/15   Mcarthur Rossetti Angiulli, PA-C  methocarbamol (ROBAXIN) 500 MG tablet Take 1 tablet (500 mg total) by mouth every 6 (six) hours as needed for muscle spasms. Patient not taking: Reported on 07/26/2015 05/24/15   Mcarthur Rossetti Angiulli, PA-C  Metoprolol Tartrate 75 MG TABS Take 75 mg by mouth 2 (two) times daily. 05/24/15   Mcarthur Rossetti Angiulli, PA-C  morphine (MS CONTIN) 30 MG  12 hr tablet Take 3 tablets (90 mg total) by mouth every 12 (twelve) hours. 05/24/15   Mcarthur Rossetti Angiulli, PA-C  morphine (MS CONTIN) 60 MG 12 hr tablet  07/11/15   Historical Provider, MD  Multiple Vitamin (MULTIVITAMIN WITH MINERALS) TABS tablet Take 1 tablet by mouth daily. 05/24/15   Mcarthur Rossetti Angiulli, PA-C  naloxegol oxalate (MOVANTIK) 25 MG TABS tablet Take 1 tablet (25 mg total) by mouth daily before breakfast. 05/24/15   Mcarthur Rossetti Angiulli, PA-C  naproxen (NAPROSYN) 500 MG tablet Take 1 tablet (500 mg total) by mouth 2 (two) times daily with a meal. Patient not taking: Reported on 07/26/2015 05/24/15   Mcarthur Rossetti Angiulli, PA-C  oxycodone (ROXICODONE) 30 MG immediate release tablet Take 1 tablet (30 mg total) by mouth every 6 (six) hours. scheduled 05/24/15   Mcarthur Rossetti Angiulli, PA-C    polyethylene glycol (MIRALAX / GLYCOLAX) packet Take 17 g by mouth daily. 05/24/15   Mcarthur Rossetti Angiulli, PA-C  SUMAtriptan (IMITREX) 50 MG tablet Take 50 mg by mouth daily as needed for migraine or headache. Maximum 2 doses in 2 days 02/15/15   Historical Provider, MD  temazepam (RESTORIL) 15 MG capsule Take 15-30 mg by mouth at bedtime. 06/25/15   Historical Provider, MD  testosterone cypionate (DEPOTESTOSTERONE CYPIONATE) 200 MG/ML injection Inject 100 mg into the muscle every 14 (fourteen) days. 06/25/15   Historical Provider, MD  trifluridine (VIROPTIC) 1 % ophthalmic solution Place 1 drop into both eyes 2 (two) times daily as needed (breakouts (blurry, matted eyelids)).    Historical Provider, MD  venlafaxine XR (EFFEXOR-XR) 75 MG 24 hr capsule Take 1 capsule (75 mg total) by mouth daily with breakfast. 05/24/15   Mcarthur Rossetti Angiulli, PA-C  warfarin (COUMADIN) 5 MG tablet 1 tablet Mondays and Thursdays. 1-1/2 tablet Sunday Tuesday Wednesday Friday Saturday Patient taking differently: Take 7.5-10 mg by mouth daily at 6 PM. Take 10 mg by mouth daily on Tuesday and Friday. Take 7.5 mg by mouth on all other days. 05/24/15   Charlton Amor, PA-C   Physical Exam: Filed Vitals:   08/04/15 1630 08/04/15 1740  BP: 124/94 140/90  Pulse: 103 102  Temp:  98.4 F (36.9 C)  Resp: 22 21   Constitutional: Oriented to person, place, and time. Well-developed and well-nourished. Cooperative.  Head: Normocephalic and atraumatic.  Nose: Nose normal.  Mouth/Throat: Uvula is midline, oropharynx is clear and moist and mucous membranes are normal.  Eyes: Conjunctivae and EOM are normal. Pupils are equal, round, and reactive to light.  Neck: Trachea normal and normal range of motion. Neck supple.  Cardiovascular: Normal rate, regular rhythm, S1 normal, S2 normal, normal heart sounds and intact distal pulses.   Pulmonary/Chest: Effort normal and breath sounds normal.  Abdominal: Soft. Bowel sounds are normal.  There is no hepatosplenomegaly. There is no tenderness.  Musculoskeletal: Normal range of motion.  Neurological: Alert and oriented to person, place, and time. Has normal strength. No cranial nerve deficit or sensory deficit.  Skin: Skin is warm, dry and intact.  Psychiatric: Has a normal mood and affect. Speech is normal and behavior is normal.      Labs on Admission:  Basic Metabolic Panel:  Recent Labs Lab 08/04/15 1028  NA 137  K 3.3*  CL 110  CO2 21*  GLUCOSE 133*  BUN 16  CREATININE 0.68  CALCIUM 8.3*   Liver Function Tests:  Recent Labs Lab 08/04/15 1028  AST 14*  ALT 12*  ALKPHOS  105  BILITOT 0.4  PROT 7.2  ALBUMIN 3.5   No results for input(s): LIPASE, AMYLASE in the last 168 hours. No results for input(s): AMMONIA in the last 168 hours. CBC:  Recent Labs Lab 08/04/15 1028  WBC 8.0  NEUTROABS 4.1  HGB 12.9*  HCT 41.5  MCV 85.2  PLT 293   Cardiac Enzymes:  Recent Labs Lab 08/04/15 1028  TROPONINI <0.03    BNP (last 3 results)  Recent Labs  07/05/15 0140  BNP 20.4    ProBNP (last 3 results) No results for input(s): PROBNP in the last 8760 hours.  CBG: No results for input(s): GLUCAP in the last 168 hours.  Radiological Exams on Admission: Dg Chest 2 View  08/04/2015  CLINICAL DATA:  Chest x-ray clearance prior to surgery. EXAM: CHEST  2 VIEW COMPARISON:  07/05/2015 FINDINGS: There is a right arm PICC line with tip in the right atrium. The heart size is normal. No pleural effusion or edema. There is no airspace consolidation identified. IMPRESSION: 1. No acute cardiopulmonary abnormalities. Electronically Signed   By: Signa Kell M.D.   On: 08/04/2015 12:07   Dg Wrist Complete Left  08/04/2015  CLINICAL DATA:  Left wrist pain with swelling, limited movement. History distal left radius fracture in 2016. History of irrigation and debridement surgery of the left wrist on 07/08/2005. EXAM: LEFT WRIST - COMPLETE 3+ VIEW COMPARISON:   Comparison plain films of the left wrist dated 05/10/2015 and 04/07/2015. Comparison also made to wrist MRI dated 07/05/2015. FINDINGS: Again noted is the erosive change along the ulna margin of the distal left radius, compatible with sequela of patient's previous osteomyelitis. This has not significantly progressed from the previous exam. No new erosions seen. 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 There appears to be some associated osseous fusion of the capitate and lunate bones. Again noted is severe joint space loss at the radiocarpal joint. There has been interval progression of healing at the distal left ulna fracture site, with exuberant callus formation. No acute fracture seen. Soft tissues about the left wrist are unremarkable. IMPRESSION: 1. Extensive erosive changes along the ulnar margin of the distal left radius, compatible with sequela of previous infection, not significantly progressed from the previous exam of 05/10/2015. Also no significant change of the severe radiocarpal joint space narrowing. 2. 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 Probable associated osseous fusion of the capitate and underlying lunate bone. 3. No new erosive change or fracture seen. Electronically Signed   By: Bary Richard M.D.   On: 08/04/2015 11:55    EKG: Independently reviewed. Pending   Assessment/Plan Principal Problem:   Acute osteomyelitis of left ulna [facility]) Active Problems:   Chronic pain syndrome   Chronic use of opiate drugs therapeutic purposes   Hypokalemia   Bacteremia due to Staphylococcus aureus   Acute deep vein thrombosis (DVT) of distal end of right lower extremity (HCC)   Osteomyelitis (HCC)    Osteomyelitis of the left wrist Acute osteomyelitis of the ulnar aspect of the left radius, treated with  cefazolin and I&D on 07/09/2015. Patient experienced increasing pain and mild erythema so came into the hospital for further evaluation. Continued Ancef, added vancomycin. Evaluate with left wrist MRI. ID and hand surgery to be consulted in the morning.  History of MSSA bacteremia Multiple blood cultures negative, been on cefazolin  for more than 2 months now.  DVT of the right lower extremity Patient is on Coumadin, check INR daily, hold Coumadin for now if surgery is decided. Placed on heparin drip per pharmacy protocol.  Hypokalemia Replete with oral supplements, check magnesium and BMP in a.m.  Chronic pain syndrome/opioid dependence Patient is on high dose of MS Contin 90 mg twice a day and 30 mg of OxyIR twice a day. Restarted home dose of narcotics to avoid withdrawal. Dilaudid IV for breakthrough pain.  History of SVT/sinus tachycardia Had history of symptomatic SVT, terminated with adenosine and now on metoprolol.   Code Status: Full code Family Communication: Plan discussed with the patient Disposition Plan: Telemetry  Time spent: 70 minutes  Shawnee Higham A, MD [facility] Pager [phone]

## 2015-08-04 NOTE — Progress Notes (Signed)
Sign out  Transfer from [facility] Patient is a 60 year old with a past medical history of endocarditis/osteomyelitis, recently hospitalized from 07/04/2015 through 07/11/2015 treated for osteomyelitis of wrist, undergoing incision and drainage and debridement on 07/09/2015, procedure performed by Dr. Izora Ribasoley of hand surgery. He was discharged on IV cefazolin with PICC line placement. He presented to [facility] today with complaints of increasing pain and erythema of left wrist. ED MD reporting reinfection and spoke with Dr. Ilsa IhaSnyder of infectious disease who will consult. ED MD reporting patient is hemodynamically stable. Patient accepted to telemetry.

## 2015-08-04 NOTE — ED Notes (Signed)
EDP informed of pts status

## 2015-08-04 NOTE — ED Notes (Signed)
Pharmacist at [facility] called and informed of EDP orders for abx consult

## 2015-08-04 NOTE — ED Notes (Signed)
Due to VS assessment, pt placed on cardiac monitior, ST noted, also cont POX with int NBP q15 min

## 2015-08-04 NOTE — ED Notes (Signed)
Bedside Hand Off report given to [facility] RN

## 2015-08-04 NOTE — Progress Notes (Addendum)
[name] [id number] Admission Data: 08/04/2015 5:39 PM Attending Provider: Jeralyn BennettEzequiel Zamora, MD  NGE:[id number]:GASSEMI, MIKE, MD Consults/ Treatment Team:    [name] is a 60 y.o. male [facility] awake, alert  & orientated  X 3,  Prior, VSS - Blood pressure 124/94, pulse 103, temperature 97.8 F (36.6 C), temperature source Oral, resp. rate 22, height 6\' 3"  (1.905 m), weight 99.791 kg (220 lb), SpO2 100 %.,  no c/o shortness of breath, no c/o chest pain, no distress noted.   IV site WDL: Right arm picc line intact, double lumen noted.   Allergies:   Allergies  Allergen Reactions  . Imitrex [Sumatriptan] Other (See Comments)    Chest pain, 2004 (tolerates 50 mg prn)  . Verapamil Other (See Comments)    Causes pvc's     Past Medical History  Diagnosis Date  . Hypertension   . Chronic knee pain   . Hyperlipemia   . PUD (peptic ulcer disease)   . Dysrhythmia   . Peripheral vascular disease (HCC)   . Anxiety   . GERD (gastroesophageal reflux disease)   . 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 Wound of right ankle 06/05/2015    Pt orientation to unit, room and routine. Information packet given to patient/family and safety video watched.  Admission INP armband ID verified with patient/family, and in place. SR up x 2, fall risk assessment complete with Patient and family verbalizing understanding of risks associated with falls. Pt verbalizes an understanding of how to use the call bell and to call for help before getting out of bed.  Skin, clean-dry- intact without evidence of bruising, or skin tears.   No evidence of skin break down noted on exam. Right foot, toe amputations noted. Great big toe and first toe.      Will cont to monitor and assist as needed.  Kern ReapBrumagin, Embry Huss L, RN 08/04/2015 5:39 PM

## 2015-08-05 DIAGNOSIS — M86132 Other acute osteomyelitis, left radius and ulna: Principal | ICD-10-CM

## 2015-08-05 DIAGNOSIS — B9689 Other specified bacterial agents as the cause of diseases classified elsewhere: Secondary | ICD-10-CM

## 2015-08-05 DIAGNOSIS — M00832 Arthritis due to other bacteria, left wrist: Secondary | ICD-10-CM

## 2015-08-05 LAB — BASIC METABOLIC PANEL
ANION GAP: 9 (ref 5–15)
Anion gap: 10 (ref 5–15)
BUN: 12 mg/dL (ref 6–20)
BUN: 13 mg/dL (ref 6–20)
CALCIUM: 9.3 mg/dL (ref 8.9–10.3)
CHLORIDE: 105 mmol/L (ref 101–111)
CHLORIDE: 99 mmol/L — AB (ref 101–111)
CO2: 23 mmol/L (ref 22–32)
CO2: 24 mmol/L (ref 22–32)
CREATININE: 0.9 mg/dL (ref 0.61–1.24)
Calcium: 9.4 mg/dL (ref 8.9–10.3)
Creatinine, Ser: 0.87 mg/dL (ref 0.61–1.24)
GFR calc non Af Amer: >60 mL/min (ref >60)
GFR calc non Af Amer: >60 mL/min (ref >60)
GLUCOSE: 89 mg/dL (ref 65–99)
Glucose, Bld: 117 mg/dL — ABNORMAL HIGH (ref 65–99)
Potassium: 5.2 mmol/L — ABNORMAL HIGH (ref 3.5–5.1)
Potassium: 4.9 mmol/L (ref 3.5–5.1)
Sodium: 137 mmol/L (ref 135–145)
Sodium: 133 mmol/L — ABNORMAL LOW (ref 135–145)

## 2015-08-05 LAB — URINE CULTURE: Culture: NO GROWTH

## 2015-08-05 LAB — PROTIME-INR
INR: 1.52 — ABNORMAL HIGH (ref 0.00–1.49)
PROTHROMBIN TIME: 18.4 s — AB (ref 11.6–15.2)

## 2015-08-05 LAB — HEPARIN LEVEL (UNFRACTIONATED)
Heparin Unfractionated: <0.1 IU/mL — ABNORMAL LOW (ref 0.30–0.70)
Heparin Unfractionated: 0.52 IU/mL (ref 0.30–0.70)

## 2015-08-05 LAB — MAGNESIUM: Magnesium: 2.1 mg/dL (ref 1.7–2.4)

## 2015-08-05 MED ORDER — POTASSIUM CHLORIDE CRYS ER 20 MEQ PO TBCR
40.0000 meq | EXTENDED_RELEASE_TABLET | Freq: Two times a day (BID) | ORAL | Status: AC
  Administered 2015-08-05 – 2015-08-06 (×2): 40 meq via ORAL
  Filled 2015-08-05 (×2): qty 2

## 2015-08-05 MED ORDER — SODIUM CHLORIDE 0.9 % IJ SOLN
10.0000 mL | INTRAMUSCULAR | Status: DC | PRN
  Administered 2015-08-07 – 2015-08-09 (×4): 10 mL
  Filled 2015-08-05 (×4): qty 40

## 2015-08-05 NOTE — Care Management Note (Addendum)
Case Management Note  Patient Details  Name: [name] MRN: [id number] Date of Birth: 1955/08/06  Subjective/Objective:                  Patient admitted fro home with spouse, was on abx at admission. Has osteo of left wrist, ID consulted, on IV abx.    Action/Plan:  Patient active with [facility] for IV abx prior to admission, and will continue after discharge for IV and RN, referral made. Eliquis card given.  Expected Discharge Date:                  Expected Discharge Plan:  Home w Home Health Services  In-House Referral:     Discharge planning Services  CM Consult  Post Acute Care Choice:    Choice offered to:     DME Arranged:    DME Agency:     HH Arranged:  IV Antibiotics HH Agency:     Status of Service:  In process, will continue to follow  Medicare Important Message Given:    Date Medicare IM Given:    Medicare IM give by:    Date Additional Medicare IM Given:    Additional Medicare Important Message give by:     If discussed at Long Length of Stay Meetings, dates discussed:    Additional Comments:  Lawerance SabalDebbie Jamica Woodyard, RN 08/05/2015, 3:38 PM

## 2015-08-05 NOTE — Consult Note (Signed)
S: pt presented to ER with worsening L wrist pain.  Pt with osteo of L wrist s/p recent 12/13 extensive I&D, cultures; has been on many weeks of IV abx.  O:Blood pressure 109/69, pulse 61, temperature 97.6 F (36.4 C), temperature source Oral, resp. rate 12, height 6\' 3"  (1.905 m), weight 99.791 kg (220 lb), SpO2 100 %.  L wrist : min swelling of wrist, don't appreciate significant erythema, wrist motion painful; limited flexion of fingers ( unchanged) WBC 8 however ? Accuracy as has been on abx MRI with marrow edema, fluid c/w cont osteo  A:chronic osteo L wrist despite aggressive I&D, IV abx   P:pt will most likely need further I&D in next 24-48 hrs, ID input appreciated.

## 2015-08-05 NOTE — Progress Notes (Signed)
Patient ID: [name], male   DOB: April 18, 1956, 60 y.o.   MRN: [id number]         [facility]    Date of Admission:  08/04/2015   Total days of antibiotics 127          Principal Problem:   Acute osteomyelitis of left ulna [facility](HCC) Active Problems:   Chronic pain syndrome   Chronic use of opiate drugs therapeutic purposes   Hypokalemia   Bacteremia due to Staphylococcus aureus   Acute deep vein thrombosis (DVT) of distal end of right lower extremity (HCC)   Osteomyelitis (HCC)   . atorvastatin  80 mg Oral q1800  .  ceFAZolin (ANCEF) IV  2 g Intravenous 3 times per day  . docusate sodium  100 mg Oral BID  . DULoxetine  60 mg Oral Q1200  . flecainide  100 mg Oral Q12H  . gabapentin  100 mg Oral TID  . metoprolol tartrate  75 mg Oral BID  . morphine  90 mg Oral Q12H  . multivitamin with minerals  1 tablet Oral Daily  . naloxegol oxalate  25 mg Oral QAC breakfast  . oxycodone  30 mg Oral 4 times per day  . polyethylene glycol  17 g Oral Daily  . sodium chloride  3 mL Intravenous Q12H  . temazepam  15-30 mg Oral QHS  . [START ON 08/15/2015] testosterone cypionate  100 mg Intramuscular Q14 Days  . vancomycin  2,000 mg Intravenous Once  . vancomycin  1,000 mg Intravenous Q12H  . venlafaxine XR  75 mg Oral Q breakfast    SUBJECTIVE: Mr. 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 He has been on IV cefazolin since that time. All subsequent blood cultures have been negative. His left thigh wound has completely healed and he is not having any further problem with his ischemic toes. However he continues to have problems with left wrist pain, swelling and redness. He felt like the inflammation worsened several days ago and came to the emergency department to see if he could get something for pain relief. He was  tachycardic and there was concern for early sepsis so he was readmitted. MRI reveals changes of progressive osteomyelitis of the left wrist with septic arthritis.  Review of Systems: Review of Systems  Constitutional: Positive for chills, weight loss, malaise/fatigue and diaphoresis. Negative for fever.  Respiratory: Negative for cough, sputum production and shortness of breath.   Cardiovascular: Positive for palpitations. Negative for chest pain.  Gastrointestinal: Negative for nausea, vomiting and diarrhea.  Genitourinary: Negative for dysuria.  Musculoskeletal: Positive for joint pain.  Skin: Negative for rash.  Neurological: Negative for headaches.    Past Medical History  Diagnosis Date  . Hypertension   . Chronic knee pain   . Hyperlipemia   . PUD (peptic ulcer disease)   . Dysrhythmia   . Peripheral vascular disease (HCC)   . Anxiety   . GERD (gastroesophageal reflux disease)   . 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 Wound of right ankle 06/05/2015    Social History  Substance Use Topics  . Smoking status: Former Smoker    Types: Cigarettes  . Smokeless tobacco: Never Used  . Alcohol Use: No    Family History  Problem Relation Age of Onset  .  Heart attack Paternal Grandmother 46  . Heart disease Mother 53    arrythmia  . Stroke Paternal Grandfather   . Stroke Maternal Grandfather    Allergies  Allergen Reactions  . Verapamil Other (See Comments)    Causes pvc's  . Imitrex [Sumatriptan] Other (See Comments)    Chest pain, 2004 (tolerates 50 mg prn)    OBJECTIVE: Filed Vitals:   08/04/15 1630 08/04/15 1740 08/04/15 2233 08/05/15 0633  BP: 124/94 140/90 124/82 131/77  Pulse: 103 102 98 68  Temp:  98.4 F (36.9 C) 100 F (37.8 C) 97.8 F (36.6 C)  TempSrc:  Oral Oral Oral  Resp: 22 21 18 18   Height:      Weight:      SpO2: 100% 100% 98% 100%   Body mass index is 27.5 kg/(m^2).  Physical Exam    Constitutional: He is oriented to person, place, and time. No distress.  Eyes: Conjunctivae are normal.  Cardiovascular: Normal rate and regular rhythm.   No murmur heard. Pulmonary/Chest: Breath sounds normal.  Abdominal: Soft. There is no tenderness.  Musculoskeletal:  Left wrist incision is fully healed. Very slight swelling of his fingers on the left hand. There is no significant redness warmth or swelling around his wrist.  Right first and second toes are surgically absent.  Neurological: He is alert and oriented to person, place, and time.  Skin: No rash noted.  Psychiatric: Mood and affect normal.    Lab Results Lab Results  Component Value Date   WBC 8.0 08/04/2015   HGB 12.9* 08/04/2015   HCT 41.5 08/04/2015   MCV 85.2 08/04/2015   PLT 293 08/04/2015    Lab Results  Component Value Date   CREATININE 0.87 08/05/2015   BUN 12 08/05/2015   NA 137 08/05/2015   K 5.2* 08/05/2015   CL 105 08/05/2015   CO2 23 08/05/2015    Lab Results  Component Value Date   ALT 12* 08/04/2015   AST 14* 08/04/2015   ALKPHOS 105 08/04/2015   BILITOT 0.4 08/04/2015     Microbiology: Recent Results (from the past 240 hour(s))  Culture, blood (single)     Status: None (Preliminary result)   Collection Time: 08/04/15 10:28 AM  Result Value Ref Range Status   Specimen Description BLOOD RIGHT PICC LINE  Final   Special Requests BOTTLES DRAWN AEROBIC AND ANAEROBIC 4CC  Final   Culture PENDING  Incomplete   Report Status PENDING  Incomplete  Culture, blood (Routine X 2) w Reflex to ID Panel     Status: None (Preliminary result)   Collection Time: 08/04/15 12:05 PM  Result Value Ref Range Status   Specimen Description BLOOD RIGHT WRIST  Final   Special Requests IN PEDIATRIC BOTTLE  Final   Culture PENDING  Incomplete   Report Status PENDING  Incomplete  Culture, blood (Routine X 2) w Reflex to ID Panel     Status: None (Preliminary result)   Collection Time: 08/04/15 12:05 PM   Result Value Ref Range Status   Specimen Description BLOOD RIGHT HAND  Final   Special Requests IN PEDIATRIC BOTTLE  Final   Culture PENDING  Incomplete   Report Status PENDING  Incomplete  Urine culture     Status: None   Collection Time: 08/04/15 12:17 PM  Result Value Ref Range Status   Specimen Description URINE, CLEAN CATCH  Final   Special Requests NONE  Final   Culture   Final  NO GROWTH 1 DAY Performed at [facility]    Report Status 08/05/2015 FINAL  Final     ASSESSMENT: He has smoldering left wrist infection despite 4 months of IV cefazolin therapy. The last culture from his left wrist, obtained on 07/09/2015, grew a bacillus species. Usually we would consider this an insignificant contaminant but there are case reports of bacillus causing invasive bone and joint infection, sometimes super infecting staph aureus infection. I agree with the addition of IV vancomycin for empiric therapy for possible bacillus infection. I will continue cefazolin for now.  PLAN: 1. Continue current antibiotics  Cliffton Asters, MD [facility] [phone] pager   [phone] cell 08/05/2015, 1:15 PM

## 2015-08-05 NOTE — Progress Notes (Signed)
[facility] PROGRESS NOTE  [name] ZOX:[id number] DOB: 1955-08-07 DOA: 08/04/2015 PCP: [name], MD Brief History: [name] is a 60 y.o. male with extensive past medical history including SVT, chronic diastolic CHF and pulmonary hypertension, recent multiple admission to the hospital with MSSA bacteremia, right femoral artery mycotic aneurysm and left wrist osteomyelitis, presents with recurrent, worsening  left wrist pain .   Assessment/Plan: 1. Acute on Chronic osteomyelitis of the left ulna: Admitted to telemetry for observation.  He was on ancef from December 15, vancomycin added on 1/8.  Blood cultures drawn and pending.  ID and Hand surgery consulted for new recommendations.  Plan for repeat I&D tomorrow or the next day.  Repeat MRi of the hand showed similar appearance to the MRI last month, but clinically he reports worsening of the left wrist and hand.  Pain control and symptomatic management.    MSSA bacteremia; Repeat cultures ordered.    DVT of the right lower extremity: Resume heparin, for possible I&D   Chronic Pain syndrome: - resume home meds and Iv Dilaudid added for breakthrough pain.   Hypokalemia : Replete and repeat in am.     Code Status: full code.  Family Communication: none at bedside Disposition Plan: pending further work up.   Consultants:  Dr [name] Hand surgery  Infectious disease Dr Orvan Falconer.  Procedures:  none  Antibiotics:  Ancef  Vancomycin 1/8  HPI/Subjective: Wants to eat, reports there is numbness in the two fingers of the left left hand  Objective: Filed Vitals:   08/04/15 2233 08/05/15 0633  BP: 124/82 131/77  Pulse: 98 68  Temp: 100 F (37.8 C) 97.8 F (36.6 C)  Resp: 18 18    Intake/Output Summary (Last 24 hours) at 08/05/15 1119 Last data filed at 08/05/15 0737  Gross per 24 hour  Intake 3017.53 ml  Output   3135 ml  Net -117.47 ml   Filed Weights   08/04/15 1007  Weight: 99.791 kg  (220 lb)    Exam:   General:  Alert afebrile comfortable.  Cardiovascular: s1s2  Respiratory: ctab  Abdomen: soft non tender non distended bowel sounds heard  Musculoskeletal: no pedal edema  Data Reviewed: Basic Metabolic Panel:  Recent Labs Lab 08/04/15 1028  NA 137  K 3.3*  CL 110  CO2 21*  GLUCOSE 133*  BUN 16  CREATININE 0.68  CALCIUM 8.3*   Liver Function Tests:  Recent Labs Lab 08/04/15 1028  AST 14*  ALT 12*  ALKPHOS 105  BILITOT 0.4  PROT 7.2  ALBUMIN 3.5   No results for input(s): LIPASE, AMYLASE in the last 168 hours. No results for input(s): AMMONIA in the last 168 hours. CBC:  Recent Labs Lab 08/04/15 1028  WBC 8.0  NEUTROABS 4.1  HGB 12.9*  HCT 41.5  MCV 85.2  PLT 293   Cardiac Enzymes:  Recent Labs Lab 08/04/15 1028  TROPONINI <0.03   BNP (last 3 results)  Recent Labs  07/05/15 0140  BNP 20.4    ProBNP (last 3 results) No results for input(s): PROBNP in the last 8760 hours.  CBG: No results for input(s): GLUCAP in the last 168 hours.  Recent Results (from the past 240 hour(s))  Culture, blood (single)     Status: None (Preliminary result)   Collection Time: 08/04/15 10:28 AM  Result Value Ref Range Status   Specimen Description BLOOD RIGHT PICC LINE  Final   Special Requests BOTTLES DRAWN AEROBIC AND ANAEROBIC 4CC  Final   Culture PENDING  Incomplete   Report Status PENDING  Incomplete  Culture, blood (Routine X 2) w Reflex to ID Panel     Status: None (Preliminary result)   Collection Time: 08/04/15 12:05 PM  Result Value Ref Range Status   Specimen Description BLOOD RIGHT WRIST  Final   Special Requests IN PEDIATRIC BOTTLE  Final   Culture PENDING  Incomplete   Report Status PENDING  Incomplete  Culture, blood (Routine X 2) w Reflex to ID Panel     Status: None (Preliminary result)   Collection Time: 08/04/15 12:05 PM  Result Value Ref Range Status   Specimen Description BLOOD RIGHT HAND  Final    Special Requests IN PEDIATRIC BOTTLE  Final   Culture PENDING  Incomplete   Report Status PENDING  Incomplete  Urine culture     Status: None   Collection Time: 08/04/15 12:17 PM  Result Value Ref Range Status   Specimen Description URINE, CLEAN CATCH  Final   Special Requests NONE  Final   Culture   Final    NO GROWTH 1 DAY Performed at [facility]    Report Status 08/05/2015 FINAL  Final     Studies: Dg Chest 2 View  08/04/2015  CLINICAL DATA:  Chest x-ray clearance prior to surgery. EXAM: CHEST  2 VIEW COMPARISON:  07/05/2015 FINDINGS: There is a right arm PICC line with tip in the right atrium. The heart size is normal. No pleural effusion or edema. There is no airspace consolidation identified. IMPRESSION: 1. No acute cardiopulmonary abnormalities. Electronically Signed   By: Signa Kell M.D.   On: 08/04/2015 12:07   Dg Wrist Complete Left  08/04/2015  CLINICAL DATA:  Left wrist pain with swelling, limited movement. History distal left radius fracture in 2016. History of irrigation and debridement surgery of the left wrist on 07/08/2005. EXAM: LEFT WRIST - COMPLETE 3+ VIEW COMPARISON:  Comparison plain films of the left wrist dated 05/10/2015 and 04/07/2015. Comparison also made to wrist MRI dated 07/05/2015. FINDINGS: Again noted is the erosive change along the ulna margin of the distal left radius, compatible with sequela of patient's previous osteomyelitis. This has not significantly progressed from the previous exam. No new erosions seen. 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 There appears to be some associated osseous fusion of the capitate and lunate bones. Again noted is severe joint space loss at the radiocarpal joint. There has been interval progression of healing at the distal left ulna fracture site, with exuberant callus formation. No acute fracture seen. Soft  tissues about the left wrist are unremarkable. IMPRESSION: 1. Extensive erosive changes along the ulnar margin of the distal left radius, compatible with sequela of previous infection, not significantly progressed from the previous exam of 05/10/2015. Also no significant change of the severe radiocarpal joint space narrowing. 2. 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 Probable associated osseous fusion of the capitate and underlying lunate bone. 3. No new erosive change or fracture seen. Electronically Signed   By: Bary Richard M.D.   On: 08/04/2015 11:55   Mr Wrist Left W Wo Contrast  08/05/2015  CLINICAL DATA:  History bacteremia.  Left wrist pain and swelling. EXAM: MR OF THE LEFT WRIST WITHOUT AND WITH CONTRAST TECHNIQUE: Multiplanar multisequence MR imaging of the left wrist was performed both before  and after the administration of intravenous contrast. CONTRAST:  20mL MULTIHANCE GADOBENATE DIMEGLUMINE 529 MG/ML IV SOLN COMPARISON:  05/11/2015, 07/05/2015 FINDINGS: Severe patient motion degrading image quality limiting evaluation. Ligaments: Tear of the scapholunate ligament. Lunotriquetral ligament is not delineated. Triangular fibrocartilage: Macerated TFCC. Tendons: Severe tendinosis of the extensor carpi ulnaris with peripheral dislocation from the distal ulnar groove. Otherwise Intact flexor and extensor compartment tendons. Carpal tunnel/median nerve: Normal carpal tunnel. Normal median nerve. Guyon's canal: Normal. Bones: There is no marrow edema in the distal radius, distal ulna, throughout the carpal bones and at the base of the metacarpals. There is erosion of the distal ulnar aspect of the radius adjacent to the radioulnar joint. There are erosive changes of the distal ulna with disruption of the distal radioulnar joint and cortical thickening, periosteal reaction and surrounding soft tissue edema involving the distal  ulnar diaphysis. There is a 7.9 x 15.3 mm peripherally enhancing fluid collection along the volar aspect of the distal radioulnar joint. There is diffuse synovitis with enhancement Other: There is no soft tissue mass or hematoma. IMPRESSION: 1. Diffuse marrow edema throughout the carpus, distal radius, distal ulna and at the bases of the metacarpals with diffuse synovitis most consistent with septic arthritis. Cortical erosion of the ulnar aspect of the distal radius and of the distal ulna at the distal radioulnar joint with a enhancing distal radioulnar joint effusion measuring 7.9 x 15.3 mm most concerning for septic arthritis. The overall changes are similar in appearance with MRI dated 07/05/2015. 2. There is cortical thickening, periosteal reaction and surrounding soft tissue edema involving the distal ulnar diaphysis which has progressed compared with the prior examination. Electronically Signed   By: Elige KoHetal  Patel   On: 08/05/2015 08:46    Scheduled Meds: . atorvastatin  80 mg Oral q1800  .  ceFAZolin (ANCEF) IV  2 g Intravenous 3 times per day  . docusate sodium  100 mg Oral BID  . DULoxetine  60 mg Oral Q1200  . flecainide  100 mg Oral Q12H  . gabapentin  100 mg Oral TID  . metoprolol tartrate  75 mg Oral BID  . morphine  90 mg Oral Q12H  . multivitamin with minerals  1 tablet Oral Daily  . naloxegol oxalate  25 mg Oral QAC breakfast  . oxycodone  30 mg Oral 4 times per day  . polyethylene glycol  17 g Oral Daily  . sodium chloride  3 mL Intravenous Q12H  . temazepam  15-30 mg Oral QHS  . [START ON 08/15/2015] testosterone cypionate  100 mg Intramuscular Q14 Days  . vancomycin  2,000 mg Intravenous Once  . vancomycin  1,000 mg Intravenous Q12H  . venlafaxine XR  75 mg Oral Q breakfast   Continuous Infusions: . sodium chloride    . heparin 1,700 Units/hr (08/04/15 2032)    Principal Problem:   Acute osteomyelitis of left ulna [facility](HCC) Active Problems:   Chronic pain syndrome    Chronic use of opiate drugs therapeutic purposes   Hypokalemia   Bacteremia due to Staphylococcus aureus   Acute deep vein thrombosis (DVT) of distal end of right lower extremity (HCC)   Osteomyelitis (HCC)    Time spent: 25 minutes    United Medical Park Asc LLCKULA,Odelia Graciano  [facility] Pager [phone]. If 7PM-7AM, please contact night-coverage at [email or web address], password [email or web address] 08/05/2015, 11:19 AM  LOS: 1 day

## 2015-08-05 NOTE — Progress Notes (Signed)
ANTICOAGULATION CONSULT NOTE - Follow Up Consult  Pharmacy Consult for Heparin Indication: DVT  Allergies  Allergen Reactions  . Verapamil Other (See Comments)    Causes pvc's  . Imitrex [Sumatriptan] Other (See Comments)    Chest pain, 2004 (tolerates 50 mg prn)    Patient Measurements: Height: 6\' 3"  (190.5 cm) Weight: 220 lb (99.791 kg) IBW/kg (Calculated) : 84.5 Heparin Dosing Weight: 99kg  Vital Signs: Temp: 97.6 F (36.4 C) (01/09 1415) Temp Source: Oral (01/09 1415) BP: 109/69 mmHg (01/09 1415) Pulse Rate: 61 (01/09 1415)  Labs:  Recent Labs  08/04/15 1028 08/04/15 1905 08/05/15 1047 08/05/15 1818 08/05/15 2033  HGB 12.9*  --   --   --   --   HCT 41.5  --   --   --   --   PLT 293  --   --   --   --   LABPROT  --  22.3* 18.4*  --   --   INR  --  1.96* 1.52*  --   --   HEPARINUNFRC  --   --  <0.10*  --  0.52  CREATININE 0.68  --  0.87 0.90  --   TROPONINI <0.03  --   --   --   --     Estimated Creatinine Clearance: 108.2 mL/min (by C-G formula based on Cr of 0.9).  Assessment: 60yo male on Heparin-bridge for DVT,while Coumadin on hold in case of potential surgery. Heparin level now therapeutic after a rate increase earlier today. No bleeding noted.   Goal of Therapy:  Heparin level 0.3-0.7 units/ml Monitor platelets by anticoagulation protocol: Yes   Plan:  Continue heparin at 1900 units/hr Next level with AM labs Daily HL, CBC Watch for s/s of bleeding  Marlyn Tondreau D. Kadisha Goodine, PharmD, BCPS Clinical Pharmacist Pager: [phone] 08/05/2015 9:11 PM

## 2015-08-05 NOTE — Progress Notes (Signed)
ANTICOAGULATION CONSULT NOTE - Follow Up Consult  Pharmacy Consult for Heparin Indication: DVT  Allergies  Allergen Reactions  . Verapamil Other (See Comments)    Causes pvc's  . Imitrex [Sumatriptan] Other (See Comments)    Chest pain, 2004 (tolerates 50 mg prn)    Patient Measurements: Height: 6\' 3"  (190.5 cm) Weight: 220 lb (99.791 kg) IBW/kg (Calculated) : 84.5 Heparin Dosing Weight:   Vital Signs: Temp: 97.8 F (36.6 C) (01/09 0633) Temp Source: Oral (01/09 16100633) BP: 131/77 mmHg (01/09 0633) Pulse Rate: 68 (01/09 0633)  Labs:  Recent Labs  08/04/15 1028 08/04/15 1905 08/05/15 1047  HGB 12.9*  --   --   HCT 41.5  --   --   PLT 293  --   --   LABPROT  --  22.3* 18.4*  INR  --  1.96* 1.52*  HEPARINUNFRC  --   --  <0.10*  CREATININE 0.68  --  0.87  TROPONINI <0.03  --   --     Estimated Creatinine Clearance: 112 mL/min (by C-G formula based on Cr of 0.87).   Medications:  Scheduled:  . atorvastatin  80 mg Oral q1800  .  ceFAZolin (ANCEF) IV  2 g Intravenous 3 times per day  . docusate sodium  100 mg Oral BID  . DULoxetine  60 mg Oral Q1200  . flecainide  100 mg Oral Q12H  . gabapentin  100 mg Oral TID  . metoprolol tartrate  75 mg Oral BID  . morphine  90 mg Oral Q12H  . multivitamin with minerals  1 tablet Oral Daily  . naloxegol oxalate  25 mg Oral QAC breakfast  . oxycodone  30 mg Oral 4 times per day  . polyethylene glycol  17 g Oral Daily  . sodium chloride  3 mL Intravenous Q12H  . temazepam  15-30 mg Oral QHS  . [START ON 08/15/2015] testosterone cypionate  100 mg Intramuscular Q14 Days  . vancomycin  2,000 mg Intravenous Once  . vancomycin  1,000 mg Intravenous Q12H  . venlafaxine XR  75 mg Oral Q breakfast    Assessment: 59yo male on Heparin-bridge for DVT,while Coumadin on hold in lieu of potential surgery.  Labs were drawn late today.  Heparin level < 0.1, INR 1.52.  No CBC.  No bleeding noted.  Will not bolus as INR remains >  1.5.  Goal of Therapy:  Heparin level 0.3-0.7 units/ml Monitor platelets by anticoagulation protocol: Yes   Plan:  Increase heparin to 1900 units/hr Check HL with Vanc trough tonight Watch for s/s of bleeding Daily HL, CBC  Marisue HumbleKendra Eyleen Rawlinson, PharmD Clinical Pharmacist [facility]

## 2015-08-06 LAB — CBC
HCT: 41 % (ref 39.0–52.0)
Hemoglobin: 12.8 g/dL — ABNORMAL LOW (ref 13.0–17.0)
MCH: 27.4 pg (ref 26.0–34.0)
MCHC: 31.2 g/dL (ref 30.0–36.0)
MCV: 87.8 fL (ref 78.0–100.0)
PLATELETS: 282 10*3/uL (ref 150–400)
RBC: 4.67 MIL/uL (ref 4.22–5.81)
RDW: 17.5 % — AB (ref 11.5–15.5)
WBC: 8.4 10*3/uL (ref 4.0–10.5)

## 2015-08-06 LAB — BASIC METABOLIC PANEL
Anion gap: 6 (ref 5–15)
BUN: 15 mg/dL (ref 6–20)
CALCIUM: 9.1 mg/dL (ref 8.9–10.3)
CO2: 27 mmol/L (ref 22–32)
CREATININE: 0.99 mg/dL (ref 0.61–1.24)
Chloride: 103 mmol/L (ref 101–111)
GFR calc non Af Amer: >60 mL/min (ref >60)
Glucose, Bld: 108 mg/dL — ABNORMAL HIGH (ref 65–99)
Potassium: 4.5 mmol/L (ref 3.5–5.1)
SODIUM: 136 mmol/L (ref 135–145)

## 2015-08-06 LAB — FUNGUS CULTURE W SMEAR: Fungal Smear: NONE SEEN

## 2015-08-06 LAB — HEMOGLOBIN A1C
HEMOGLOBIN A1C: 5.8 % — AB (ref 4.8–5.6)
Mean Plasma Glucose: 120 mg/dL

## 2015-08-06 LAB — HEPARIN LEVEL (UNFRACTIONATED): Heparin Unfractionated: 0.45 IU/mL (ref 0.30–0.70)

## 2015-08-06 LAB — VANCOMYCIN, TROUGH: Vancomycin Tr: 11 ug/mL (ref 10.0–20.0)

## 2015-08-06 MED ORDER — VANCOMYCIN HCL IN DEXTROSE 1-5 GM/200ML-% IV SOLN
1000.0000 mg | Freq: Three times a day (TID) | INTRAVENOUS | Status: DC
  Administered 2015-08-06 – 2015-08-08 (×7): 1000 mg via INTRAVENOUS
  Filled 2015-08-06 (×8): qty 200

## 2015-08-06 MED ORDER — HYDROMORPHONE HCL 1 MG/ML IJ SOLN
2.0000 mg | INTRAMUSCULAR | Status: DC | PRN
Start: 2015-08-06 — End: 2015-08-09
  Administered 2015-08-06 – 2015-08-09 (×10): 2 mg via INTRAVENOUS
  Administered 2015-08-09: 1 mg via INTRAVENOUS
  Administered 2015-08-09 (×2): 2 mg via INTRAVENOUS
  Filled 2015-08-06 (×13): qty 2

## 2015-08-06 NOTE — Progress Notes (Signed)
ANTIBIOTIC CONSULT NOTE  Pharmacy Consult for Vancomycin Indication: osteomyelitis  Allergies  Allergen Reactions  . Verapamil Other (See Comments)    Causes pvc's  . Imitrex [Sumatriptan] Other (See Comments)    Chest pain, 2004 (tolerates 50 mg prn)    Patient Measurements: Height: 6\' 3"  (190.5 cm) Weight: 220 lb (99.791 kg) IBW/kg (Calculated) : 84.5  Vital Signs: Temp: 97.8 F (36.6 C) (01/09 2330) Temp Source: Oral (01/09 2330) BP: 107/88 mmHg (01/09 2330) Pulse Rate: 64 (01/09 2330) Intake/Output from previous day: 01/09 0701 - 01/10 0700 In: 705.5 [P.O.:400; I.V.:235.5; IV Piggyback:70] Out: 2675 [Urine:2675] Intake/Output from this shift: Total I/O In: -  Out: 700 [Urine:700]  Labs:  Recent Labs  08/04/15 1028 08/05/15 1047 08/05/15 1818  WBC 8.0  --   --   HGB 12.9*  --   --   PLT 293  --   --   CREATININE 0.68 0.87 0.90   Estimated Creatinine Clearance: 108.2 mL/min (by C-G formula based on Cr of 0.9).  Recent Labs  08/06/15 0010  VANCOTROUGH 11     Assessment: 60 y.o. male with L wrist osteomyelitis for empiric antibiotics Goal of Therapy:  Vancomycin trough level 15-20 mcg/ml  Plan:  Change Vancomycin 1000mg  q8h  Geannie RisenGreg Freddrick Gladson, PharmD, BCPS  08/06/2015,12:59 AM

## 2015-08-06 NOTE — Progress Notes (Signed)
[facility]  Patient Status: Active (receiving services up to time of hospitalization)  [facility] is providing the following services: RN  If patient discharges after hours, please call [phone].   Kizzie FurnishDonna Fellmy 08/06/2015, 10:35 AM

## 2015-08-06 NOTE — Progress Notes (Signed)
Patient ID: [name], male   DOB: 03/04/1956, 60 y.o.   MRN: [id number]         [facility] for Infectious Disease    Date of Admission:  07/24/2015   Total days of antibiotics 128         He still feels that his left wrist is more swollen and painful over the past week. He met with Dr. Lenon Curt yesterday and consideration is being given for further surgery. If he does undergo surgery I would like to have deep tissue submitted for routine Gram stain, aerobic and anaerobic cultures. I will continue current antibiotic therapy to cover MSSA and possible bacillus infection for now.         Michel Bickers, MD [facility] for Infectious [facility] [phone] pager   [phone] cell 07/30/2015, 1:32 PM

## 2015-08-06 NOTE — Progress Notes (Signed)
ANTICOAGULATION CONSULT NOTE - Follow Up Consult  Pharmacy Consult for Heparin Indication: DVT  Allergies  Allergen Reactions  . Verapamil Other (See Comments)    Causes pvc's  . Imitrex [Sumatriptan] Other (See Comments)    Chest pain, 2004 (tolerates 50 mg prn)    Patient Measurements: Height: 6\' 3"  (190.5 cm) Weight: 220 lb (99.791 kg) IBW/kg (Calculated) : 84.5 Heparin Dosing Weight: 99kg  Vital Signs: Temp: 97.7 F (36.5 C) (01/10 1503) Temp Source: Oral (01/10 1503) BP: 99/67 mmHg (01/10 1503) Pulse Rate: 70 (01/10 1503)  Labs:  Recent Labs  08/04/15 1028 08/04/15 1905 08/05/15 1047 08/05/15 1818 08/05/15 2033 08/06/15 0511  HGB 12.9*  --   --   --   --  12.8*  HCT 41.5  --   --   --   --  41.0  PLT 293  --   --   --   --  282  LABPROT  --  22.3* 18.4*  --   --   --   INR  --  1.96* 1.52*  --   --   --   HEPARINUNFRC  --   --  <0.10*  --  0.52 0.45  CREATININE 0.68  --  0.87 0.90  --  0.99  TROPONINI <0.03  --   --   --   --   --     Estimated Creatinine Clearance: 98.4 mL/min (by C-G formula based on Cr of 0.99).  Assessment: 60 yo male on Heparin-bridge for DVT, while Coumadin on hold in anticipation of wrist I&D.  Heparin level therapeutic on 1900 units/hr.  No bleeding noted.   Goal of Therapy:  Heparin level 0.3-0.7 units/ml Monitor platelets by anticoagulation protocol: Yes   Plan:  Continue heparin at 1900 units/hr Next level with AM labs Daily HL, CBC, INR Watch for s/s of bleeding Follow-up plans for surgery  Dixie DialsKimberly Leyla Soliz, Pharm.D., BCPS Clinical Pharmacist Pager [phone] 08/06/2015 3:31 PM

## 2015-08-06 NOTE — Progress Notes (Signed)
[facility] PROGRESS NOTE  [name] ZOX:[id number] DOB: 1955/11/12 DOA: 08/04/2015 PCP: [name], MD Brief History: [name] is a 60 y.o. male with extensive past medical history including SVT, chronic diastolic CHF and pulmonary hypertension, recent multiple admission to the hospital with MSSA bacteremia, right femoral artery mycotic aneurysm and left wrist osteomyelitis, presents with recurrent, worsening  left wrist pain .   Assessment/Plan: 1. Acute osteomyelitis of the left ulna: Admitted to telemetry for observation.  He was on ancef from December 15, vancomycin added on 1/8.  Blood cultures drawn and pending.  ID and Hand surgery consulted for new recommendations.  Plan for repeat I&D tomorrow or the next day.  Repeat MRi of the hand showed similar appearance to the MRI last month, but clinically he reports worsening of the left wrist and hand.  Pain control and symptomatic management.    MSSA bacteremia; Repeat cultures ordered.    DVT of the right lower extremity: Resume heparin, for possible I&D in the next day or two.  Chronic Pain syndrome: - resume home meds and Iv Dilaudid added for breakthrough pain.   Hypokalemia : Repleted and repeat in am shows improvement.     Code Status: full code.  Family Communication: family at bedside Disposition Plan: pending further work up.   Consultants:  Dr [name] Hand surgery  Infectious disease Dr Orvan Falconer.  Procedures:  none  Antibiotics:  Ancef  Vancomycin 1/8  HPI/Subjective: Reports worsening pain, changed diluadid to every 2 hours prn.  Objective: Filed Vitals:   08/06/15 0524 08/06/15 1032  BP: 105/64 118/75  Pulse: 72 87  Temp: 97.7 F (36.5 C)   Resp: 18     Intake/Output Summary (Last 24 hours) at 08/06/15 1301 Last data filed at 08/06/15 1105  Gross per 24 hour  Intake 805.53 ml  Output   1525 ml  Net -719.47 ml   Filed Weights   08/04/15 1007  Weight: 99.791 kg (220  lb)    Exam:   General:  Alert afebrile comfortable.  Cardiovascular: s1s2  Respiratory: ctab  Abdomen: soft non tender non distended bowel sounds heard  Musculoskeletal: no pedal edema, left hand tender and swollen .   Data Reviewed: Basic Metabolic Panel:  Recent Labs Lab 08/04/15 1028 08/05/15 1047 08/05/15 1818 08/06/15 0511  NA 137 137 133* 136  K 3.3* 5.2* 4.9 4.5  CL 110 105 99* 103  CO2 21* 23 24 27   GLUCOSE 133* 89 117* 108*  BUN 16 12 13 15   CREATININE 0.68 0.87 0.90 0.99  CALCIUM 8.3* 9.3 9.4 9.1  MG  --  2.1  --   --    Liver Function Tests:  Recent Labs Lab 08/04/15 1028  AST 14*  ALT 12*  ALKPHOS 105  BILITOT 0.4  PROT 7.2  ALBUMIN 3.5   No results for input(s): LIPASE, AMYLASE in the last 168 hours. No results for input(s): AMMONIA in the last 168 hours. CBC:  Recent Labs Lab 08/04/15 1028 08/06/15 0511  WBC 8.0 8.4  NEUTROABS 4.1  --   HGB 12.9* 12.8*  HCT 41.5 41.0  MCV 85.2 87.8  PLT 293 282   Cardiac Enzymes:  Recent Labs Lab 08/04/15 1028  TROPONINI <0.03   BNP (last 3 results)  Recent Labs  07/05/15 0140  BNP 20.4    ProBNP (last 3 results) No results for input(s): PROBNP in the last 8760 hours.  CBG: No results for input(s): GLUCAP in the last 168  hours.  Recent Results (from the past 240 hour(s))  Culture, blood (single)     Status: None (Preliminary result)   Collection Time: 08/04/15 10:28 AM  Result Value Ref Range Status   Specimen Description BLOOD RIGHT PICC LINE  Final   Special Requests BOTTLES DRAWN AEROBIC AND ANAEROBIC 4CC  Final   Culture   Final    NO GROWTH < 24 HOURS Performed at [facility]    Report Status PENDING  Incomplete  Culture, blood (Routine X 2) w Reflex to ID Panel     Status: None (Preliminary result)   Collection Time: 08/04/15 12:05 PM  Result Value Ref Range Status   Specimen Description BLOOD RIGHT WRIST  Final   Special Requests IN PEDIATRIC BOTTLE   Final   Culture   Final    NO GROWTH < 24 HOURS Performed at [facility]    Report Status PENDING  Incomplete  Culture, blood (Routine X 2) w Reflex to ID Panel     Status: None (Preliminary result)   Collection Time: 08/04/15 12:05 PM  Result Value Ref Range Status   Specimen Description BLOOD RIGHT HAND  Final   Special Requests IN PEDIATRIC BOTTLE  Final   Culture   Final    NO GROWTH < 24 HOURS Performed at [facility]    Report Status PENDING  Incomplete  Urine culture     Status: None   Collection Time: 08/04/15 12:17 PM  Result Value Ref Range Status   Specimen Description URINE, CLEAN CATCH  Final   Special Requests NONE  Final   Culture   Final    NO GROWTH 1 DAY Performed at [facility]    Report Status 08/05/2015 FINAL  Final     Studies: Mr Wrist Left W Wo Contrast  08/05/2015  CLINICAL DATA:  History bacteremia.  Left wrist pain and swelling. EXAM: MR OF THE LEFT WRIST WITHOUT AND WITH CONTRAST TECHNIQUE: Multiplanar multisequence MR imaging of the left wrist was performed both before and after the administration of intravenous contrast. CONTRAST:  20mL MULTIHANCE GADOBENATE DIMEGLUMINE 529 MG/ML IV SOLN COMPARISON:  05/11/2015, 07/05/2015 FINDINGS: Severe patient motion degrading image quality limiting evaluation. Ligaments: Tear of the scapholunate ligament. Lunotriquetral ligament is not delineated. Triangular fibrocartilage: Macerated TFCC. Tendons: Severe tendinosis of the extensor carpi ulnaris with peripheral dislocation from the distal ulnar groove. Otherwise Intact flexor and extensor compartment tendons. Carpal tunnel/median nerve: Normal carpal tunnel. Normal median nerve. Guyon's canal: Normal. Bones: There is no marrow edema in the distal radius, distal ulna, throughout the carpal bones and at the base of the metacarpals. There is erosion of the distal ulnar aspect of the radius adjacent to the radioulnar joint. There are erosive  changes of the distal ulna with disruption of the distal radioulnar joint and cortical thickening, periosteal reaction and surrounding soft tissue edema involving the distal ulnar diaphysis. There is a 7.9 x 15.3 mm peripherally enhancing fluid collection along the volar aspect of the distal radioulnar joint. There is diffuse synovitis with enhancement Other: There is no soft tissue mass or hematoma. IMPRESSION: 1. Diffuse marrow edema throughout the carpus, distal radius, distal ulna and at the bases of the metacarpals with diffuse synovitis most consistent with septic arthritis. Cortical erosion of the ulnar aspect of the distal radius and of the distal ulna at the distal radioulnar joint with a enhancing distal radioulnar joint effusion measuring 7.9 x 15.3 mm most concerning for  septic arthritis. The overall changes are similar in appearance with MRI dated 07/05/2015. 2. There is cortical thickening, periosteal reaction and surrounding soft tissue edema involving the distal ulnar diaphysis which has progressed compared with the prior examination. Electronically Signed   By: Elige KoHetal  Patel   On: 08/05/2015 08:46    Scheduled Meds: . atorvastatin  80 mg Oral q1800  .  ceFAZolin (ANCEF) IV  2 g Intravenous 3 times per day  . docusate sodium  100 mg Oral BID  . DULoxetine  60 mg Oral Q1200  . flecainide  100 mg Oral Q12H  . gabapentin  100 mg Oral TID  . metoprolol tartrate  75 mg Oral BID  . morphine  90 mg Oral Q12H  . multivitamin with minerals  1 tablet Oral Daily  . naloxegol oxalate  25 mg Oral QAC breakfast  . oxycodone  30 mg Oral 4 times per day  . polyethylene glycol  17 g Oral Daily  . sodium chloride  3 mL Intravenous Q12H  . temazepam  15-30 mg Oral QHS  . [START ON 08/15/2015] testosterone cypionate  100 mg Intramuscular Q14 Days  . vancomycin  1,000 mg Intravenous Q8H  . venlafaxine XR  75 mg Oral Q breakfast   Continuous Infusions: . sodium chloride    . heparin 1,900 Units/hr  (08/06/15 0528)    Principal Problem:   Acute osteomyelitis of left ulna (HCC) Active Problems:   Chronic pain syndrome   Chronic use of opiate drugs therapeutic purposes   Hypokalemia   Bacteremia due to Staphylococcus aureus   Acute deep vein thrombosis (DVT) of distal end of right lower extremity (HCC)   Osteomyelitis (HCC)    Time spent: 25 minutes    Mesa SpringsKULA,Masen Luallen  [facility] Pager [phone]. If 7PM-7AM, please contact night-coverage at [email or web address], password [email or web address] 08/06/2015, 1:01 PM  LOS: 2 days

## 2015-08-07 ENCOUNTER — Ambulatory Visit: Payer: Medicaid Other | Admitting: Infectious Disease

## 2015-08-07 DIAGNOSIS — I824Z1 Acute embolism and thrombosis of unspecified deep veins of right distal lower extremity: Secondary | ICD-10-CM

## 2015-08-07 DIAGNOSIS — G894 Chronic pain syndrome: Secondary | ICD-10-CM

## 2015-08-07 LAB — BASIC METABOLIC PANEL
ANION GAP: 8 (ref 5–15)
BUN: 14 mg/dL (ref 6–20)
CALCIUM: 9 mg/dL (ref 8.9–10.3)
CHLORIDE: 100 mmol/L — AB (ref 101–111)
CO2: 26 mmol/L (ref 22–32)
Creatinine, Ser: 0.93 mg/dL (ref 0.61–1.24)
GFR calc Af Amer: >60 mL/min (ref >60)
GFR calc non Af Amer: >60 mL/min (ref >60)
GLUCOSE: 104 mg/dL — AB (ref 65–99)
Potassium: 4.4 mmol/L (ref 3.5–5.1)
Sodium: 134 mmol/L — ABNORMAL LOW (ref 135–145)

## 2015-08-07 LAB — SURGICAL PCR SCREEN
MRSA, PCR: NEGATIVE
Staphylococcus aureus: NEGATIVE

## 2015-08-07 LAB — PROTIME-INR
INR: 1.21 (ref 0.00–1.49)
PROTHROMBIN TIME: 15.5 s — AB (ref 11.6–15.2)

## 2015-08-07 LAB — HEPARIN LEVEL (UNFRACTIONATED): Heparin Unfractionated: 0.14 IU/mL — ABNORMAL LOW (ref 0.30–0.70)

## 2015-08-07 LAB — CBC
HEMATOCRIT: 37.8 % — AB (ref 39.0–52.0)
HEMOGLOBIN: 11.6 g/dL — AB (ref 13.0–17.0)
MCH: 26.6 pg (ref 26.0–34.0)
MCHC: 30.7 g/dL (ref 30.0–36.0)
MCV: 86.7 fL (ref 78.0–100.0)
PLATELETS: 240 10*3/uL (ref 150–400)
RBC: 4.36 MIL/uL (ref 4.22–5.81)
RDW: 17.1 % — ABNORMAL HIGH (ref 11.5–15.5)
WBC: 6.2 10*3/uL (ref 4.0–10.5)

## 2015-08-07 MED ORDER — HEPARIN (PORCINE) IN NACL 100-0.45 UNIT/ML-% IJ SOLN
2400.0000 [IU]/h | INTRAMUSCULAR | Status: AC
  Administered 2015-08-07: 2200 [IU]/h via INTRAVENOUS
  Administered 2015-08-08: 2400 [IU]/h via INTRAVENOUS
  Filled 2015-08-07: qty 250

## 2015-08-07 NOTE — Progress Notes (Signed)
ANTICOAGULATION CONSULT NOTE - Follow Up Consult  Pharmacy Consult for Heparin Indication: DVT history (Coumadin on hold)  Allergies  Allergen Reactions  . Verapamil Other (See Comments)    Causes pvc's  . Imitrex [Sumatriptan] Other (See Comments)    Chest pain, 2004 (tolerates 50 mg prn)    Patient Measurements: Height: 6\' 3"  (190.5 cm) Weight: 220 lb (99.791 kg) IBW/kg (Calculated) : 84.5 Heparin Dosing Weight: 99kg  Vital Signs: Temp: 97.7 F (36.5 C) (01/11 1402) Temp Source: Oral (01/11 1402) BP: 102/71 mmHg (01/11 1402) Pulse Rate: 65 (01/11 1402)  Labs:  Recent Labs  08/05/15 1047 08/05/15 1818 08/05/15 2033 08/06/15 0511 08/07/15 0601  HGB  --   --   --  12.8* 11.6*  HCT  --   --   --  41.0 37.8*  PLT  --   --   --  282 240  LABPROT 18.4*  --   --   --  15.5*  INR 1.52*  --   --   --  1.21  HEPARINUNFRC <0.10*  --  0.52 0.45 0.14*  CREATININE 0.87 0.90  --  0.99 0.93    Estimated Creatinine Clearance: 104.7 mL/min (by C-G formula based on Cr of 0.93).  Assessment: 60 yo male on Heparin-bridge for hx DVT, while Coumadin on hold in anticipation of wrist I&D.  Heparin level now sub-therapeutic on 1900 units/hr.  No IV issues or bleeding per RN.  Still awaiting decision [id number]:timing of wrist I&D.   Pt was scheduled for I&D tonight but is rescheduled for tomorrow AM. Will restart heparin at 2200 units/hr and turn off at 0600.  Goal of Therapy:  Heparin level 0.3-0.7 units/ml Monitor platelets by anticoagulation protocol: Yes   Plan:  Heparin to 2200 units/hr Heparin level in 6 hours Daily heparin level, CBC, INR Watch for s/s of bleeding  Arlean Hoppingorey M. Newman PiesBall, PharmD, BCPS Clinical Pharmacist Pager [phone]  08/07/2015 7:16 PM

## 2015-08-07 NOTE — Progress Notes (Signed)
Dr Orvan Falconerampbell called patient will not go to surgery tonight but will go tomorrow.  Called Dr Jerral RalphGhimire to get dinner tray for patient and make him NPO at midnight

## 2015-08-07 NOTE — Progress Notes (Addendum)
Patient going to surgery Paged DR Izora Ribascoley about Heparin gtts.  Asked me to speak with Hospitalist about restarting Heparin    Spoke with dr Jerral RalphGhimire about restarting the heparin drip.  We are to restart Heparin and stop it within 6 hours of surgery.  Pharmacy is to be consulted about dosing

## 2015-08-07 NOTE — Progress Notes (Signed)
PATIENT DETAILS Name: [name] Age: 60 y.o. Sex: male Date of Birth: Oct 29, 1955 Admit Date: 08/04/2015 Admitting Physician Clydia Llano, MD ZOX:WRUEAVW, MIKE, MD  Subjective: Continues to have pain at his left wrist joint. Swelling essentially unchanged.  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 Await further input from hand surgery.  Active Problems:  Recent history of MSSA bacteremia: On IV Ancef prior to this admission, continue with antibiotics. 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 Resume Coumadin when incision and drainage has been completed.  Chronic pain syndrome: continue home doses of narcotics.   Dyslipidemia: Continue statin  Hypertension: Controlled, continue metoprolol  History of SVT: Continue flecainide, metoprolol.  Disposition: Remain inpatient  Antimicrobial agents  See below  Anti-infectives    Start     Dose/Rate Route Frequency Ordered Stop   08/06/15 0800  vancomycin (VANCOCIN) IVPB 1000 mg/200 mL premix     1,000 mg 200 mL/hr over 60 Minutes Intravenous Every 8 hours 08/06/15 0102     08/05/15 0100  vancomycin (VANCOCIN) IVPB 1000 mg/200 mL premix  Status:  Discontinued     1,000 mg 200 mL/hr over 60 Minutes Intravenous Every 12 hours 08/04/15 1213 08/06/15 0102   08/04/15 2200  ceFAZolin (ANCEF) IVPB 2 g/50 mL premix  Status:  Discontinued     2 g 100 mL/hr over 30 Minutes Intravenous 3 times per day 08/04/15 1840 08/04/15 2126   08/04/15 1505  ceFAZolin (ANCEF) 1-5 GM-% IVPB    Comments:  Juanda Crumble   : cabinet override      08/04/15 1505 08/04/15 1559   08/04/15 1500  ceFAZolin (ANCEF) 2 g in dextrose 5 % 50 mL IVPB     2 g 140 mL/hr over 30 Minutes Intravenous 3 times per day 08/04/15 1212     08/04/15 1215   ceFAZolin (ANCEF) 2 g in dextrose 5 % 50 mL IVPB  Status:  Discontinued     2 g 140 mL/hr over 30 Minutes Intravenous  Once 08/04/15 1201 08/04/15 1212   08/04/15 1200  vancomycin (VANCOCIN) 2,000 mg in sodium chloride 0.9 % 500 mL IVPB  Status:  Discontinued     2,000 mg 250 mL/hr over 120 Minutes Intravenous  Once 08/04/15 1201 08/06/15 0102      DVT Prophylaxis: IV Heparin   Code Status: Full code   Family Communication None at bedside  Procedures: None  CONSULTS:  ID and orthopedic surgery  Time spent 30 minutes-Greater than 50% of this time was spent in counseling, explanation of diagnosis, planning of further management, and coordination of care.  MEDICATIONS: Scheduled Meds: . atorvastatin  80 mg Oral q1800  .  ceFAZolin (ANCEF) IV  2 g Intravenous 3 times per day  . docusate sodium  100 mg Oral BID  . DULoxetine  60 mg Oral Q1200  . flecainide  100 mg Oral Q12H  . gabapentin  100 mg Oral TID  . metoprolol tartrate  75 mg Oral BID  . morphine  90 mg Oral Q12H  . multivitamin with minerals  1 tablet Oral Daily  . naloxegol oxalate  25 mg Oral QAC breakfast  . oxycodone  30 mg Oral 4 times per day  . polyethylene glycol  17 g  Oral Daily  . sodium chloride  3 mL Intravenous Q12H  . temazepam  15-30 mg Oral QHS  . [START ON 08/15/2015] testosterone cypionate  100 mg Intramuscular Q14 Days  . vancomycin  1,000 mg Intravenous Q8H  . venlafaxine XR  75 mg Oral Q breakfast   Continuous Infusions: . sodium chloride     PRN Meds:.acetaminophen **OR** acetaminophen, albuterol, clonazePAM, HYDROmorphone (DILAUDID) injection, methocarbamol, ondansetron **OR** ondansetron (ZOFRAN) IV, sodium chloride, SUMAtriptan, trifluridine    PHYSICAL EXAM: Vital signs in last 24 hours: Filed Vitals:   08/06/15 2204 08/07/15 0029 08/07/15 0522 08/07/15 1402  BP: 98/64 112/66 110/69 102/71  Pulse: 80  86 65  Temp: 97.6 F (36.4 C)  98 F (36.7 C) 97.7 F (36.5 C)  TempSrc:  Oral  Oral Oral  Resp: 16  14 19   Height:      Weight:      SpO2: 98%  97% 100%    Weight change:  Filed Weights   08/04/15 1007  Weight: 99.791 kg (220 lb)   Body mass index is 27.5 kg/(m^2).   Gen Exam: Awake and alert with clear speech.   Neck: Supple, No JVD.   Chest: B/L Clear.   CVS: S1 S2 Regular, no murmurs.  Abdomen: soft, BS +, non tender, non distended.  Extremities: no edema, lower extremities warm to touch. Neurologic: Non Focal.   Skin: No Rash.   Wounds: N/A.    Intake/Output from previous day:  Intake/Output Summary (Last 24 hours) at 08/07/15 1427 Last data filed at 08/07/15 1408  Gross per 24 hour  Intake 2171.23 ml  Output    350 ml  Net 1821.23 ml     LAB RESULTS: CBC  Recent Labs Lab 08/04/15 1028 08/06/15 0511 08/07/15 0601  WBC 8.0 8.4 6.2  HGB 12.9* 12.8* 11.6*  HCT 41.5 41.0 37.8*  PLT 293 282 240  MCV 85.2 87.8 86.7  MCH 26.5 27.4 26.6  MCHC 31.1 31.2 30.7  RDW 18.3* 17.5* 17.1*  LYMPHSABS 2.8  --   --   MONOABS 0.9  --   --   EOSABS 0.1  --   --   BASOSABS 0.0  --   --     Chemistries   Recent Labs Lab 08/04/15 1028 08/05/15 1047 08/05/15 1818 08/06/15 0511 08/07/15 0601  NA 137 137 133* 136 134*  K 3.3* 5.2* 4.9 4.5 4.4  CL 110 105 99* 103 100*  CO2 21* 23 24 27 26   GLUCOSE 133* 89 117* 108* 104*  BUN 16 12 13 15 14   CREATININE 0.68 0.87 0.90 0.99 0.93  CALCIUM 8.3* 9.3 9.4 9.1 9.0  MG  --  2.1  --   --   --     CBG: No results for input(s): GLUCAP in the last 168 hours.  GFR Estimated Creatinine Clearance: 104.7 mL/min (by C-G formula based on Cr of 0.93).  Coagulation profile  Recent Labs Lab 08/04/15 1905 08/05/15 1047 08/07/15 0601  INR 1.96* 1.52* 1.21    Cardiac Enzymes  Recent Labs Lab 08/04/15 1028  TROPONINI <0.03    Invalid input(s): POCBNP No results for input(s): DDIMER in the last 72 hours.  Recent Labs  08/04/15 1905  HGBA1C 5.8*   No results for input(s): CHOL, HDL,  LDLCALC, TRIG, CHOLHDL, LDLDIRECT in the last 72 hours.  Recent Labs  08/04/15 1905  TSH 2.116   No results for input(s): VITAMINB12, FOLATE, FERRITIN, TIBC, IRON, RETICCTPCT in the last 72  hours. No results for input(s): LIPASE, AMYLASE in the last 72 hours.  Urine Studies No results for input(s): UHGB, CRYS in the last 72 hours.  Invalid input(s): UACOL, UAPR, USPG, UPH, UTP, UGL, UKET, UBIL, UNIT, UROB, ULEU, UEPI, UWBC, URBC, UBAC, CAST, UCOM, BILUA  MICROBIOLOGY: Recent Results (from the past 240 hour(s))  Culture, blood (single)     Status: None (Preliminary result)   Collection Time: 08/04/15 10:28 AM  Result Value Ref Range Status   Specimen Description BLOOD RIGHT PICC LINE  Final   Special Requests BOTTLES DRAWN AEROBIC AND ANAEROBIC 4CC  Final   Culture   Final    NO GROWTH 3 DAYS Performed at [facility]    Report Status PENDING  Incomplete  Culture, blood (Routine X 2) w Reflex to ID Panel     Status: None (Preliminary result)   Collection Time: 08/04/15 12:05 PM  Result Value Ref Range Status   Specimen Description BLOOD RIGHT WRIST  Final   Special Requests IN PEDIATRIC BOTTLE  Final   Culture   Final    NO GROWTH 3 DAYS Performed at [facility]    Report Status PENDING  Incomplete  Culture, blood (Routine X 2) w Reflex to ID Panel     Status: None (Preliminary result)   Collection Time: 08/04/15 12:05 PM  Result Value Ref Range Status   Specimen Description BLOOD RIGHT HAND  Final   Special Requests IN PEDIATRIC BOTTLE  Final   Culture   Final    NO GROWTH 3 DAYS Performed at [facility]    Report Status PENDING  Incomplete  Urine culture     Status: None   Collection Time: 08/04/15 12:17 PM  Result Value Ref Range Status   Specimen Description URINE, CLEAN CATCH  Final   Special Requests NONE  Final   Culture   Final    NO GROWTH 1 DAY Performed at [facility]    Report Status 08/05/2015 FINAL  Final     RADIOLOGY STUDIES/RESULTS: Dg Chest 2 View  08/04/2015  CLINICAL DATA:  Chest x-ray clearance prior to surgery. EXAM: CHEST  2 VIEW COMPARISON:  07/05/2015 FINDINGS: There is a right arm PICC line with tip in the right atrium. The heart size is normal. No pleural effusion or edema. There is no airspace consolidation identified. IMPRESSION: 1. No acute cardiopulmonary abnormalities. Electronically Signed   By: Signa Kell M.D.   On: 08/04/2015 12:07   Dg Wrist Complete Left  08/04/2015  CLINICAL DATA:  Left wrist pain with swelling, limited movement. History distal left radius fracture in 2016. History of irrigation and debridement surgery of the left wrist on 07/08/2005. EXAM: LEFT WRIST - COMPLETE 3+ VIEW COMPARISON:  Comparison plain films of the left wrist dated 05/10/2015 and 04/07/2015. Comparison also made to wrist MRI dated 07/05/2015. FINDINGS: Again noted is the erosive change along the ulna margin of the distal left radius, compatible with sequela of patient's previous osteomyelitis. This has not significantly progressed from the previous exam. No new erosions seen. 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 There appears to be some associated osseous fusion of the capitate and lunate bones. Again noted is severe joint space loss at the radiocarpal joint. There has been interval progression of healing at the distal left ulna fracture site, with exuberant callus formation. No acute fracture seen. Soft tissues about  the left wrist are unremarkable. IMPRESSION: 1. Extensive erosive changes along the ulnar margin of the distal left radius, compatible with sequela of previous infection, not significantly progressed from the previous exam of 05/10/2015. Also no significant change of the severe radiocarpal joint space narrowing. 2. Interval collapse of the scaphoid and lunate bones, with increased  scapholunate dissociation indicative of SLAC wrist. Probable associated osseous fusion of the capitate and underlying lunate bone. 3. No new erosive change or fracture seen. Electronically Signed   By: Bary Richard M.D.   On: 08/04/2015 11:55   Mr Wrist Left W Wo Contrast  08/05/2015  CLINICAL DATA:  History bacteremia.  Left wrist pain and swelling. EXAM: MR OF THE LEFT WRIST WITHOUT AND WITH CONTRAST TECHNIQUE: Multiplanar multisequence MR imaging of the left wrist was performed both before and after the administration of intravenous contrast. CONTRAST:  20mL MULTIHANCE GADOBENATE DIMEGLUMINE 529 MG/ML IV SOLN COMPARISON:  05/11/2015, 07/05/2015 FINDINGS: Severe patient motion degrading image quality limiting evaluation. Ligaments: Tear of the scapholunate ligament. Lunotriquetral ligament is not delineated. Triangular fibrocartilage: Macerated TFCC. Tendons: Severe tendinosis of the extensor carpi ulnaris with peripheral dislocation from the distal ulnar groove. Otherwise Intact flexor and extensor compartment tendons. Carpal tunnel/median nerve: Normal carpal tunnel. Normal median nerve. Guyon's canal: Normal. Bones: There is no marrow edema in the distal radius, distal ulna, throughout the carpal bones and at the base of the metacarpals. There is erosion of the distal ulnar aspect of the radius adjacent to the radioulnar joint. There are erosive changes of the distal ulna with disruption of the distal radioulnar joint and cortical thickening, periosteal reaction and surrounding soft tissue edema involving the distal ulnar diaphysis. There is a 7.9 x 15.3 mm peripherally enhancing fluid collection along the volar aspect of the distal radioulnar joint. There is diffuse synovitis with enhancement Other: There is no soft tissue mass or hematoma. IMPRESSION: 1. Diffuse marrow edema throughout the carpus, distal radius, distal ulna and at the bases of the metacarpals with diffuse synovitis most consistent with  septic arthritis. Cortical erosion of the ulnar aspect of the distal radius and of the distal ulna at the distal radioulnar joint with a enhancing distal radioulnar joint effusion measuring 7.9 x 15.3 mm most concerning for septic arthritis. The overall changes are similar in appearance with MRI dated 07/05/2015. 2. There is cortical thickening, periosteal reaction and surrounding soft tissue edema involving the distal ulnar diaphysis which has progressed compared with the prior examination. Electronically Signed   By: Elige Ko   On: 08/05/2015 08:46    Jeoffrey Massed, MD  [facility] Pager:[phone]  If 7PM-7AM, please contact night-coverage [email or web address] Password [email or web address] 08/07/2015, 2:27 PM   LOS: 3 days

## 2015-08-07 NOTE — Progress Notes (Addendum)
ANTICOAGULATION CONSULT NOTE - Follow Up Consult  Pharmacy Consult for Heparin Indication: DVT history (Coumadin on hold)  Allergies  Allergen Reactions  . Verapamil Other (See Comments)    Causes pvc's  . Imitrex [Sumatriptan] Other (See Comments)    Chest pain, 2004 (tolerates 50 mg prn)    Patient Measurements: Height: 6\' 3"  (190.5 cm) Weight: 220 lb (99.791 kg) IBW/kg (Calculated) : 84.5 Heparin Dosing Weight: 99kg  Vital Signs: Temp: 98 F (36.7 C) (01/11 0522) Temp Source: Oral (01/11 0522) BP: 110/69 mmHg (01/11 0522) Pulse Rate: 86 (01/11 0522)  Labs:  Recent Labs  08/04/15 1028 08/04/15 1905  08/05/15 1047 08/05/15 1818 08/05/15 2033 08/06/15 0511 08/07/15 0601  HGB 12.9*  --   --   --   --   --  12.8* 11.6*  HCT 41.5  --   --   --   --   --  41.0 37.8*  PLT 293  --   --   --   --   --  282 240  LABPROT  --  22.3*  --  18.4*  --   --   --  15.5*  INR  --  1.96*  --  1.52*  --   --   --  1.21  HEPARINUNFRC  --   --   < > <0.10*  --  0.52 0.45 0.14*  CREATININE 0.68  --   --  0.87 0.90  --  0.99 0.93  TROPONINI <0.03  --   --   --   --   --   --   --   < > = values in this interval not displayed.  Estimated Creatinine Clearance: 104.7 mL/min (by C-G formula based on Cr of 0.93).  Assessment: 60 yo male on Heparin-bridge for hx DVT, while Coumadin on hold in anticipation of wrist I&D.  Heparin level now sub-therapeutic on 1900 units/hr.  No IV issues or bleeding per RN.  Still awaiting decision VH:QIONGEre:timing of wrist I&D.   Goal of Therapy:  Heparin level 0.3-0.7 units/ml Monitor platelets by anticoagulation protocol: Yes   Plan:  Increase heparin to 2200 units/hr Heparin level in 6 hours Follow-up plans for OR for wrist I&D Daily heparin level, CBC, INR Watch for s/s of bleeding  Toys 'R' UsKimberly Angella Montas, Pharm.D., BCPS Clinical Pharmacist Pager [phone] 08/07/2015 9:02 AM   RN discussed with Dr. Izora Ribasoley (ortho), pt will have surgery today at 5pm.  To stop  heparin infusion now.  Will discontinue heparin level ordered for this afternoon.  Will need to follow-up when anticoagulation can be restarted post-op.  Toys 'R' UsKimberly Shyanne Mcclary, Pharm.D., BCPS Clinical Pharmacist Pager [phone] 08/07/2015 11:04 AM

## 2015-08-08 ENCOUNTER — Encounter (HOSPITAL_COMMUNITY): Payer: Self-pay | Admitting: Certified Registered"

## 2015-08-08 ENCOUNTER — Inpatient Hospital Stay (HOSPITAL_COMMUNITY): Payer: Medicaid Other | Admitting: Anesthesiology

## 2015-08-08 ENCOUNTER — Encounter (HOSPITAL_COMMUNITY)
Admission: EM | Disposition: A | Discharge status: Home Health | Payer: Self-pay | Source: Home / Self Care | Attending: Internal Medicine

## 2015-08-08 DIAGNOSIS — Z79899 Other long term (current) drug therapy: Secondary | ICD-10-CM

## 2015-08-08 HISTORY — PX: I&D EXTREMITY: SHX5045

## 2015-08-08 LAB — CBC
HCT: 38.4 % — ABNORMAL LOW (ref 39.0–52.0)
Hemoglobin: 11.9 g/dL — ABNORMAL LOW (ref 13.0–17.0)
MCH: 26.7 pg (ref 26.0–34.0)
MCHC: 31 g/dL (ref 30.0–36.0)
MCV: 86.3 fL (ref 78.0–100.0)
PLATELETS: 247 10*3/uL (ref 150–400)
RBC: 4.45 MIL/uL (ref 4.22–5.81)
RDW: 17 % — AB (ref 11.5–15.5)
WBC: 5.5 10*3/uL (ref 4.0–10.5)

## 2015-08-08 LAB — BASIC METABOLIC PANEL
Anion gap: 7 (ref 5–15)
BUN: 13 mg/dL (ref 6–20)
CALCIUM: 9 mg/dL (ref 8.9–10.3)
CO2: 30 mmol/L (ref 22–32)
CREATININE: 0.92 mg/dL (ref 0.61–1.24)
Chloride: 101 mmol/L (ref 101–111)
Glucose, Bld: 150 mg/dL — ABNORMAL HIGH (ref 65–99)
Potassium: 4.3 mmol/L (ref 3.5–5.1)
SODIUM: 138 mmol/L (ref 135–145)

## 2015-08-08 LAB — PROTIME-INR
INR: 1.1 (ref 0.00–1.49)
Prothrombin Time: 14.4 seconds (ref 11.6–15.2)

## 2015-08-08 LAB — HEPARIN LEVEL (UNFRACTIONATED): HEPARIN UNFRACTIONATED: 0.25 [IU]/mL — AB (ref 0.30–0.70)

## 2015-08-08 LAB — VANCOMYCIN, TROUGH: VANCOMYCIN TR: 23 ug/mL — AB (ref 10.0–20.0)

## 2015-08-08 SURGERY — IRRIGATION AND DEBRIDEMENT EXTREMITY
Anesthesia: General | Site: Wrist | Laterality: Left

## 2015-08-08 MED ORDER — HYDROMORPHONE HCL 1 MG/ML IJ SOLN
INTRAMUSCULAR | Status: AC
  Administered 2015-08-09: 1 mg
  Filled 2015-08-08: qty 1

## 2015-08-08 MED ORDER — MIDAZOLAM HCL 2 MG/2ML IJ SOLN
INTRAMUSCULAR | Status: AC
  Filled 2015-08-08: qty 2

## 2015-08-08 MED ORDER — PROPOFOL 10 MG/ML IV BOLUS
INTRAVENOUS | Status: AC
  Filled 2015-08-08: qty 20

## 2015-08-08 MED ORDER — DEXAMETHASONE SODIUM PHOSPHATE 4 MG/ML IJ SOLN
INTRAMUSCULAR | Status: DC | PRN
  Administered 2015-08-08: 4 mg via INTRAVENOUS

## 2015-08-08 MED ORDER — VANCOMYCIN HCL POWD: 1.0000 g | Status: DC

## 2015-08-08 MED ORDER — VANCOMYCIN HCL 10 G IV SOLR
1250.0000 mg | Freq: Two times a day (BID) | INTRAVENOUS | Status: DC
  Administered 2015-08-08 – 2015-08-09 (×2): 1250 mg via INTRAVENOUS
  Filled 2015-08-08 (×4): qty 1250

## 2015-08-08 MED ORDER — 0.9 % SODIUM CHLORIDE (POUR BTL) OPTIME
TOPICAL | Status: DC | PRN
  Administered 2015-08-08: 1000 mL

## 2015-08-08 MED ORDER — ONDANSETRON HCL 4 MG/2ML IJ SOLN
INTRAMUSCULAR | Status: AC
  Filled 2015-08-08: qty 2

## 2015-08-08 MED ORDER — VANCOMYCIN HCL 500 MG IV SOLR
INTRAVENOUS | Status: DC | PRN
  Administered 2015-08-08: 1000 mg

## 2015-08-08 MED ORDER — BUPIVACAINE HCL (PF) 0.25 % IJ SOLN
INTRAMUSCULAR | Status: AC
  Filled 2015-08-08: qty 30

## 2015-08-08 MED ORDER — LIDOCAINE HCL (CARDIAC) 20 MG/ML IV SOLN
INTRAVENOUS | Status: AC
  Filled 2015-08-08: qty 5

## 2015-08-08 MED ORDER — MIDAZOLAM HCL 5 MG/5ML IJ SOLN
INTRAMUSCULAR | Status: DC | PRN
  Administered 2015-08-08: 2 mg via INTRAVENOUS

## 2015-08-08 MED ORDER — CEFAZOLIN SODIUM-DEXTROSE 2-3 GM-% IV SOLR
INTRAVENOUS | Status: AC
  Filled 2015-08-08: qty 50

## 2015-08-08 MED ORDER — HEPARIN (PORCINE) IN NACL 100-0.45 UNIT/ML-% IJ SOLN: 2400.0000 [IU]/h | INTRAMUSCULAR | Status: DC

## 2015-08-08 MED ORDER — DEXAMETHASONE SODIUM PHOSPHATE 4 MG/ML IJ SOLN
INTRAMUSCULAR | Status: AC
  Filled 2015-08-08: qty 2

## 2015-08-08 MED ORDER — FENTANYL CITRATE (PF) 250 MCG/5ML IJ SOLN
INTRAMUSCULAR | Status: AC
  Filled 2015-08-08: qty 5

## 2015-08-08 MED ORDER — TOBRAMYCIN SULFATE 80 MG/2ML IJ SOLN
INTRAMUSCULAR | Status: DC | PRN
  Administered 2015-08-08: 80 mg via INTRAMUSCULAR

## 2015-08-08 MED ORDER — LACTATED RINGERS IV SOLN
INTRAVENOUS | Status: DC
  Administered 2015-08-08: 14:00:00 via INTRAVENOUS

## 2015-08-08 MED ORDER — DEXAMETHASONE SODIUM PHOSPHATE 4 MG/ML IJ SOLN
INTRAMUSCULAR | Status: AC
  Filled 2015-08-08: qty 1

## 2015-08-08 MED ORDER — HYDROMORPHONE HCL 1 MG/ML IJ SOLN
0.2500 mg | INTRAMUSCULAR | Status: DC | PRN
  Administered 2015-08-08 (×2): 0.5 mg via INTRAVENOUS

## 2015-08-08 MED ORDER — SODIUM CHLORIDE 0.9 % IR SOLN
Status: DC | PRN
  Administered 2015-08-08 (×3): 1000 mL

## 2015-08-08 MED ORDER — TOBRAMYCIN SULFATE 80 MG/2ML IJ SOLN
INTRAMUSCULAR | Status: DC | PRN
  Administered 2015-08-08 (×2): 80 mg via INTRAMUSCULAR

## 2015-08-08 MED ORDER — PROPOFOL 10 MG/ML IV BOLUS
INTRAVENOUS | Status: DC | PRN
  Administered 2015-08-08: 140 mg via INTRAVENOUS

## 2015-08-08 MED ORDER — SODIUM CHLORIDE 0.9 % IR SOLN
Status: DC | PRN
  Administered 2015-08-08: 500 mL

## 2015-08-08 MED ORDER — BUPIVACAINE HCL (PF) 0.25 % IJ SOLN
INTRAMUSCULAR | Status: DC | PRN
  Administered 2015-08-08: 15 mL

## 2015-08-08 MED ORDER — FENTANYL CITRATE (PF) 100 MCG/2ML IJ SOLN
INTRAMUSCULAR | Status: DC | PRN
Start: 2015-08-08 — End: 2015-08-08
  Administered 2015-08-08: 100 ug via INTRAVENOUS

## 2015-08-08 SURGICAL SUPPLY — 56 items
15 FR ROUND CHANNEL DRAIN ×2 IMPLANT
BAG DECANTER FOR FLEXI CONT (MISCELLANEOUS) ×3 IMPLANT
BANDAGE ACE 4X5 VEL STRL LF (GAUZE/BANDAGES/DRESSINGS) ×3 IMPLANT
BANDAGE ELASTIC 3 VELCRO ST LF (GAUZE/BANDAGES/DRESSINGS) IMPLANT
BANDAGE ELASTIC 4 VELCRO ST LF (GAUZE/BANDAGES/DRESSINGS) IMPLANT
BNDG GAUZE ELAST 4 BULKY (GAUZE/BANDAGES/DRESSINGS) ×2 IMPLANT
CORDS BIPOLAR (ELECTRODE) IMPLANT
COVER SURGICAL LIGHT HANDLE (MISCELLANEOUS) ×2 IMPLANT
CUFF TOURNIQUET SINGLE 18IN (TOURNIQUET CUFF) ×3 IMPLANT
DRAPE OEC MINIVIEW 54X84 (DRAPES) ×3 IMPLANT
DRAPE SURG 17X23 STRL (DRAPES) ×3 IMPLANT
ELECT REM PT RETURN 9FT ADLT (ELECTROSURGICAL)
ELECTRODE REM PT RTRN 9FT ADLT (ELECTROSURGICAL) IMPLANT
EVACUATOR SILICONE 100CC (DRAIN) ×2 IMPLANT
GAUZE PACKING IODOFORM 1/4X5 (PACKING) IMPLANT
GAUZE SPONGE 4X4 12PLY STRL (GAUZE/BANDAGES/DRESSINGS) ×3 IMPLANT
GAUZE XEROFORM 1X8 LF (GAUZE/BANDAGES/DRESSINGS) ×3 IMPLANT
GAUZE XEROFORM 5X9 LF (GAUZE/BANDAGES/DRESSINGS) ×3 IMPLANT
GLOVE BIOGEL M 8.0 STRL (GLOVE) ×3 IMPLANT
GLOVE BIOGEL M STRL SZ7.5 (GLOVE) ×3 IMPLANT
GOWN STRL REUS W/ TWL LRG LVL3 (GOWN DISPOSABLE) ×2 IMPLANT
GOWN STRL REUS W/TWL LRG LVL3 (GOWN DISPOSABLE) ×6
HANDPIECE INTERPULSE COAX TIP (DISPOSABLE)
KIT BASIN OR (CUSTOM PROCEDURE TRAY) ×3 IMPLANT
KIT ROOM TURNOVER OR (KITS) ×3 IMPLANT
KIT STIMULAN RAPID CURE 5CC (Orthopedic Implant) ×2 IMPLANT
MANIFOLD NEPTUNE II (INSTRUMENTS) ×3 IMPLANT
NDL 18GX1X1/2 (RX/OR ONLY) (NEEDLE) IMPLANT
NDL HYPO 25GX1X1/2 BEV (NEEDLE) IMPLANT
NEEDLE 18GX1X1/2 (RX/OR ONLY) (NEEDLE) ×3 IMPLANT
NEEDLE HYPO 25GX1X1/2 BEV (NEEDLE) IMPLANT
NS IRRIG 1000ML POUR BTL (IV SOLUTION) ×3 IMPLANT
PACK ORTHO EXTREMITY (CUSTOM PROCEDURE TRAY) ×3 IMPLANT
PAD ARMBOARD 7.5X6 YLW CONV (MISCELLANEOUS) ×6 IMPLANT
PAD CAST 3X4 CTTN HI CHSV (CAST SUPPLIES) IMPLANT
PAD CAST 4YDX4 CTTN HI CHSV (CAST SUPPLIES) IMPLANT
PADDING CAST COTTON 3X4 STRL (CAST SUPPLIES) ×3
PADDING CAST COTTON 4X4 STRL (CAST SUPPLIES) ×3
SET CYSTO W/LG BORE CLAMP LF (SET/KITS/TRAYS/PACK) ×2 IMPLANT
SET HNDPC FAN SPRY TIP SCT (DISPOSABLE) IMPLANT
SOAP 2 % CHG 4 OZ (WOUND CARE) ×3 IMPLANT
SPLINT FIBERGLASS 4X30 (CAST SUPPLIES) ×3 IMPLANT
SPONGE LAP 18X18 X RAY DECT (DISPOSABLE) IMPLANT
SPONGE LAP 4X18 X RAY DECT (DISPOSABLE) ×3 IMPLANT
SUT ETHILON 4 0 PS 2 18 (SUTURE) ×2 IMPLANT
SUT VIC AB 3-0 SH 27 (SUTURE) ×3
SUT VIC AB 3-0 SH 27X BRD (SUTURE) IMPLANT
SWAB COLLECTION DEVICE MRSA (MISCELLANEOUS) ×3 IMPLANT
SYR CONTROL 10ML LL (SYRINGE) ×2 IMPLANT
TOWEL OR 17X24 6PK STRL BLUE (TOWEL DISPOSABLE) ×3 IMPLANT
TOWEL OR 17X26 10 PK STRL BLUE (TOWEL DISPOSABLE) ×3 IMPLANT
TUBE ANAEROBIC SPECIMEN COL (MISCELLANEOUS) IMPLANT
TUBE CONNECTING 12'X1/4 (SUCTIONS) ×1
TUBE CONNECTING 12X1/4 (SUCTIONS) ×2 IMPLANT
WATER STERILE IRR 1000ML POUR (IV SOLUTION) ×3 IMPLANT
YANKAUER SUCT BULB TIP NO VENT (SUCTIONS) ×3 IMPLANT

## 2015-08-08 NOTE — Transfer of Care (Signed)
Immediate Anesthesia Transfer of Care Note  Patient: [name]  Procedure(s) Performed: Procedure(s): IRRIGATION AND DEBRIDEMENT EXTREMITY (Left)  Patient Location: PACU  Anesthesia Type:General  Level of Consciousness: awake and patient cooperative  Airway & Oxygen Therapy: Patient Spontanous Breathing and Patient connected to nasal cannula oxygen  Post-op Assessment: Report given to RN, Post -op Vital signs reviewed and stable and Patient moving all extremities  Post vital signs: Reviewed and stable  Last Vitals:  Filed Vitals:   08/08/15 0522 08/08/15 1332  BP: 107/65 110/67  Pulse: 74 66  Temp: 36.6 C 36.7 C  Resp: 16 19    Complications: No apparent anesthesia complications

## 2015-08-08 NOTE — Progress Notes (Signed)
Pharmacy Antibiotic Follow-up Note  [name] is a 60 y.o. year-old male admitted on 08/04/2015.  The patient is currently on day 5 of Vancomycin for L wrist osteomyelitis.  Assessment/Plan: Vancomycin trough is elevated on q8h regimen.  Will adjust dose to 1250mg  IV q12h.  Will follow-up post-op for long-term antibiotic plans.  Temp (24hrs), Avg:97.8 F (36.6 C), Min:97.7 F (36.5 C), Max:97.9 F (36.6 C)   Recent Labs Lab 08/04/15 1028 08/06/15 0511 08/07/15 0601 08/08/15 0216  WBC 8.0 8.4 6.2 5.5    Recent Labs Lab 08/05/15 1047 08/05/15 1818 08/06/15 0511 08/07/15 0601 08/08/15 0216  CREATININE 0.87 0.90 0.99 0.93 0.92   Estimated Creatinine Clearance: 105.9 mL/min (by C-G formula based on Cr of 0.92).    Allergies  Allergen Reactions  . Verapamil Other (See Comments)    Causes pvc's  . Imitrex [Sumatriptan] Other (See Comments)    Chest pain, 2004 (tolerates 50 mg prn)    Antimicrobials this admission: Vancomycin 1/8 >> Cefazolin (PTA 12/15)>>  Levels/dose changes this admission: 1/10 VT 11 and dose changed to 1gm IV q8h 1/12 VT 23 and dose changed to 1250mg  IV q12h  Microbiology results: 1/8: blood x 3 ngtd 1/8 urine- neg 1/11 MRSA PCR negative  Thank you for allowing pharmacy to be a part of this patient's care.  Toys 'R' UsKimberly Brettany Sydney, Pharm.D., BCPS Clinical Pharmacist Pager [phone] 08/08/2015 11:01 AM

## 2015-08-08 NOTE — Progress Notes (Signed)
Pt seen and examined, procedure explained to him, all questions answered.  Will proceed with surgery as planned.

## 2015-08-08 NOTE — Op Note (Signed)
NAMElam Dutch:  Costlow, Andrewjames                   ACCOUNT NO.:  [id number]  MEDICAL RECORD NO.:  [id number]  LOCATION:  [id number]                        FACILITY:  [facility]  PHYSICIAN:  Johnette AbrahamHarrill C Shigeko Manard, MD    DATE OF BIRTH:  [birth year]  DATE OF PROCEDURE:  08/08/2015 DATE OF DISCHARGE:                              OPERATIVE REPORT   PREOPERATIVE DIAGNOSIS:  Subacute/chronic osteomyelitis of the left wrist joint.  POSTOPERATIVE DIAGNOSIS:  Subacute/chronic osteomyelitis of the left wrist joint.  PROCEDURE: 1. Open arthrotomy of the left dorsal wrist joint with drainage     and sharp debridement of synovium, bone of the left wrist. 2. Partial excision of the scaphoid. 3. Partial excision of the lunate. 4. Partial excision of the triquetrum, debridement of the TFCC and     debridement of the distal radius, distal ulna, and DRUJ joint. 5. Placement of Stimulan calcium sulfate biodegradable antibiotic     irrigated beads.vancomycin, tobramycin 6. Complex wound closure.  INDICATIONS:  Mr. Caryn SectionFox is well known to multiple specialties here.  He presented recently with increasing pain and swelling of his left wrist suggestive of repeat infection.  MRI obtained revealed was consistent with subacute and chronic osteomyelitis with possible acute exacerbation of septic joint of the left wrist.  Risks, benefits, and alternatives of surgical drainage were thoroughly discussed with the patient.  The patient signed the consent for the procedure after all questions were answered.  DESCRIPTION OF PROCEDURE:  The patient was taken to the operating room, placed supine on the operating table.  Time-out was performed.  General anesthesia was administered without difficulty.  The left upper extremity was prepped and draped in normal sterile fashion.  The arm was exsanguinated and tourniquet was used, inflated to 250 mmHg.  The old dorsal incision was opened.  There was quite a bit of postoperative changes in the  subcutaneous tissue.  Deep skin flaps were raised exposing the extensor retinaculum; the extensor retinaculum was scarred in and thickened.  This was taken down in a stepwise fashion exposing the fourth extensor compartment.  Tendons were retracted ulnarly and the interval into the wrist joint was located.  The dorsal wrist capsule was extremely thickened and inflamed.  Sharp dissection was used to make an interval through this capsule exposing the ulnar most side of the radius.  There was no purulent material that was encountered upon entering the wrist joint.  There was some inflamed synovium and thickened synovium as mentioned earlier, but no gross purulence. Further dissection was performed more proximally on the radius in the vicinity of the distal radioulnar joint.  There was quite a bit of thickened synovial tissue.  Again, no gross purulence.  The dorsal radioulnar ligament was left intact.  There was a defect as seen radiographically on the MRI here at the DRUJ. Probing more volarly under the ulna, there was some fluid that was encountered.  It was nonpurulent type fluid; however, this was sent for culture.  More proximal on the ulna was the periosteal thickening or reaction that was seen on the x-ray, this was scraped with a curette and did not reveal any infectious type material. Next, the dorsal  capsule on the distal radius and scaphoid were evaluated.  The scaphoid was not its normal appearance as seen on the MRI.  The surrounding synovium was rongeured.  The proximal pole of the scaphoid was also excised, leaving the distal and volar ligaments intact.  It appeared that the lunate and capitate had already begin to fuse and therefore just a portion of this articulation between the lunate and the radius were rongeured as well as the proximal portion of the triquetrum.  The radius itself was curetted. I did not encounter any purulent fluid or nonviable infectious type tissue;  however, this was thoroughly curetted and a rongeur was used to remove any remaining synovium here.  Afterwards the DRUJ interval and the joint space were thoroughly irrigated with 3 L of saline solution with a cysto tubing.  After this was finished, the areas were dried. Cautery was used to cauterize any remaining synovial tissue for postoperative bleeding control.  Once this was complete, an additional 500 mL of irrigation fluid containing bacitracin was used to irrigate the wound.   The Stimulan was mixed with 1 g of vancomycin powder and 240 mg of tobramycin.  This was placed in the apparatus to make beads and allowed to dry.  During this interval x-ray was brought into the field.  Several x-ray views were made for documentation of partial scapholunate and triquetrum removal.  The tourniquet was then released.  Hemostasis was achieved.  There was still some oozing of the skin and some synovial tissue most likely due to the patient's previous heparin.  However, no gross bleeding was encountered. Following, Stimulan beads were placed in 2 areas :in the DRUJ space and along the ulna where the periosteal thickening was seen on the MRI, and in the area where the scaphoid, lunate, and triquetrum articulated with the radius and ulna.  Following the tendons were replaced.  Of note, the posterior interosseous nerve was isolated, retracted back approximately 2 cm and cauterized for again postoperative pain control.  Just prior to closure A 15 Blake drain was placed from the ulnar side of the wrist, placed underneath the extensor tendons. The extensor retinaculum that was very thickened had to be fashioned for closure over the extensor tendons.  This was performed with interrupted 3-0 Vicryl.  A subcutaneous closure of 3-0 Vicryl was also performed approximating the skin.  The skin was closed with 4-0 nylon.   A sterile dressing and volar resting splint were placed.  The patient tolerated  procedure well, was taken to recovery room in stable condition.     Johnette Abraham, MD     HCC/MEDQ  D:  08/08/2015  T:  08/08/2015  Job:  [id number]

## 2015-08-08 NOTE — Progress Notes (Signed)
[facility]

## 2015-08-08 NOTE — Progress Notes (Signed)
ANTICOAGULATION CONSULT NOTE - Follow Up Consult  Pharmacy Consult for Heparin Indication: DVT history (Coumadin on hold)  Allergies  Allergen Reactions  . Verapamil Other (See Comments)    Causes pvc's  . Imitrex [Sumatriptan] Other (See Comments)    Chest pain, 2004 (tolerates 50 mg prn)    Patient Measurements: Height: 6\' 3"  (190.5 cm) Weight: 220 lb (99.791 kg) IBW/kg (Calculated) : 84.5 Heparin Dosing Weight: 99kg  Vital Signs: Temp: 97.8 F (36.6 C) (01/11 2140) Temp Source: Oral (01/11 2140) BP: 94/65 mmHg (01/11 2140) Pulse Rate: 71 (01/11 2140)  Labs:  Recent Labs  08/05/15 1047 08/05/15 1818  08/06/15 0511 08/07/15 0601 08/08/15 0216  HGB  --   --   < > 12.8* 11.6* 11.9*  HCT  --   --   --  41.0 37.8* 38.4*  PLT  --   --   --  282 240 247  LABPROT 18.4*  --   --   --  15.5* 14.4  INR 1.52*  --   --   --  1.21 1.10  HEPARINUNFRC <0.10*  --   < > 0.45 0.14* 0.25*  CREATININE 0.87 0.90  --  0.99 0.93  --   < > = values in this interval not displayed.  Estimated Creatinine Clearance: 104.7 mL/min (by C-G formula based on Cr of 0.93).  Assessment: 60 yo male on Heparin-bridge for hx DVT, while Coumadin on hold in anticipation of wrist I&D. INR down to 1.1. Heparin level 0.25 (subtherapeutic) on 2200 units/hr. No issues with line or bleeding reported per RN. Noted heparin to be turned off at 0600 for I&D.   Goal of Therapy:  Heparin level 0.3-0.7 units/ml Monitor platelets by anticoagulation protocol: Yes   Plan:  Increase heparin to 2400 units/hr - to be turned off at 066 Will f/u heparin restart post I&D  Christoper Fabianaron Tykel Badie, PharmD, BCPS Clinical pharmacist, pager [phone] 08/08/2015 2:58 AM

## 2015-08-08 NOTE — Progress Notes (Signed)
PATIENT DETAILS Name: [name] Age: 60 y.o. Sex: male Date of Birth: 1956-01-20 Admit Date: 08/04/2015 Admitting Physician Clydia Llano, MD WUJ:WJXBJYN, MIKE, MD  Subjective: Continues to have pain at his left wrist joint. 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  Active Problems:  Recent history of MSSA bacteremia: On IV Ancef prior to this admission, continue with antibiotics. 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 Case Management consulted.  Chronic pain syndrome: continue home doses of narcotics.   Dyslipidemia: Continue statin  Hypertension: Controlled, continue metoprolol  History of SVT: Continue flecainide, metoprolol.  Disposition: Remain inpatient-home in 1-2 days  Antimicrobial agents  See below  Anti-infectives    Start     Dose/Rate Route Frequency Ordered Stop   08/08/15 2000  vancomycin (VANCOCIN) 1,250 mg in sodium chloride 0.9 % 250 mL IVPB     1,250 mg 166.7 mL/hr over 90 Minutes Intravenous Every 12 hours 08/08/15 1103     08/06/15 0800  vancomycin (VANCOCIN) IVPB 1000 mg/200 mL premix  Status:  Discontinued     1,000 mg 200 mL/hr over 60 Minutes Intravenous Every 8 hours 08/06/15 0102 08/08/15 1103   08/05/15 0100  vancomycin (VANCOCIN) IVPB 1000 mg/200 mL premix  Status:  Discontinued     1,000 mg 200 mL/hr over 60 Minutes Intravenous Every 12 hours 08/04/15 1213 08/06/15 0102   08/04/15 2200  ceFAZolin (ANCEF) IVPB 2 g/50 mL premix  Status:  Discontinued     2 g 100 mL/hr over 30 Minutes Intravenous 3 times per day 08/04/15 1840 08/04/15 2126   08/04/15 1505  ceFAZolin (ANCEF) 1-5  GM-% IVPB    Comments:  Juanda Crumble   : cabinet override      08/04/15 1505 08/04/15 1559   08/04/15 1500  ceFAZolin (ANCEF) 2 g in dextrose 5 % 50 mL IVPB     2 g 140 mL/hr over 30 Minutes Intravenous 3 times per day 08/04/15 1212     08/04/15 1215  ceFAZolin (ANCEF) 2 g in dextrose 5 % 50 mL IVPB  Status:  Discontinued     2 g 140 mL/hr over 30 Minutes Intravenous  Once 08/04/15 1201 08/04/15 1212   08/04/15 1200  vancomycin (VANCOCIN) 2,000 mg in sodium chloride 0.9 % 500 mL IVPB  Status:  Discontinued     2,000 mg 250 mL/hr over 120 Minutes Intravenous  Once 08/04/15 1201 08/06/15 0102      DVT Prophylaxis: IV Heparin   Code Status: Full code   Family Communication None at bedside  Procedures: None  CONSULTS:  ID and orthopedic surgery  Time spent 25 minutes-Greater than 50% of this time was spent in counseling, explanation of diagnosis, planning of further management, and coordination of care.  MEDICATIONS: Scheduled Meds: . atorvastatin  80 mg Oral q1800  .  ceFAZolin (ANCEF) IV  2 g Intravenous 3 times per day  . docusate sodium  100 mg Oral BID  . DULoxetine  60 mg Oral Q1200  . flecainide  100 mg Oral Q12H  . gabapentin  100 mg Oral TID  . metoprolol tartrate  75 mg Oral  BID  . morphine  90 mg Oral Q12H  . multivitamin with minerals  1 tablet Oral Daily  . naloxegol oxalate  25 mg Oral QAC breakfast  . oxycodone  30 mg Oral 4 times per day  . polyethylene glycol  17 g Oral Daily  . sodium chloride  3 mL Intravenous Q12H  . temazepam  15-30 mg Oral QHS  . [START ON 08/15/2015] testosterone cypionate  100 mg Intramuscular Q14 Days  . vancomycin  1,250 mg Intravenous Q12H  . venlafaxine XR  75 mg Oral Q breakfast   Continuous Infusions: . sodium chloride     PRN Meds:.acetaminophen **OR** acetaminophen, albuterol, clonazePAM, HYDROmorphone (DILAUDID) injection, methocarbamol, ondansetron **OR** ondansetron (ZOFRAN) IV, sodium chloride, SUMAtriptan,  trifluridine    PHYSICAL EXAM: Vital signs in last 24 hours: Filed Vitals:   08/07/15 1402 08/07/15 2140 08/07/15 2140 08/08/15 0522  BP: 102/71 94/65 94/65  107/65  Pulse: 65  71 74  Temp: 97.7 F (36.5 C)  97.8 F (36.6 C) 97.9 F (36.6 C)  TempSrc: Oral  Oral Oral  Resp: 19  19 16   Height:      Weight:      SpO2: 100%  98% 97%    Weight change:  Filed Weights   08/04/15 1007  Weight: 99.791 kg (220 lb)   Body mass index is 27.5 kg/(m^2).   Gen Exam: Awake and alert with clear speech.   Neck: Supple, No JVD.   Chest: B/L Clear.   CVS: S1 S2 Regular, no murmurs.  Abdomen: soft, BS +, non tender, non distended.  Extremities: no edema, lower extremities warm to touch. Neurologic: Non Focal.   Skin: No Rash.   Wounds: N/A.    Intake/Output from previous day:  Intake/Output Summary (Last 24 hours) at 08/08/15 1133 Last data filed at 08/08/15 0745  Gross per 24 hour  Intake 1288.23 ml  Output      0 ml  Net 1288.23 ml     LAB RESULTS: CBC  Recent Labs Lab 08/04/15 1028 08/06/15 0511 08/07/15 0601 08/08/15 0216  WBC 8.0 8.4 6.2 5.5  HGB 12.9* 12.8* 11.6* 11.9*  HCT 41.5 41.0 37.8* 38.4*  PLT 293 282 240 247  MCV 85.2 87.8 86.7 86.3  MCH 26.5 27.4 26.6 26.7  MCHC 31.1 31.2 30.7 31.0  RDW 18.3* 17.5* 17.1* 17.0*  LYMPHSABS 2.8  --   --   --   MONOABS 0.9  --   --   --   EOSABS 0.1  --   --   --   BASOSABS 0.0  --   --   --     Chemistries   Recent Labs Lab 08/05/15 1047 08/05/15 1818 08/06/15 0511 08/07/15 0601 08/08/15 0216  NA 137 133* 136 134* 138  K 5.2* 4.9 4.5 4.4 4.3  CL 105 99* 103 100* 101  CO2 23 24 27 26 30   GLUCOSE 89 117* 108* 104* 150*  BUN 12 13 15 14 13   CREATININE 0.87 0.90 0.99 0.93 0.92  CALCIUM 9.3 9.4 9.1 9.0 9.0  MG 2.1  --   --   --   --     CBG: No results for input(s): GLUCAP in the last 168 hours.  GFR Estimated Creatinine Clearance: 105.9 mL/min (by C-G formula based on Cr of 0.92).  Coagulation  profile  Recent Labs Lab 08/04/15 1905 08/05/15 1047 08/07/15 0601 08/08/15 0216  INR 1.96* 1.52* 1.21 1.10    Cardiac Enzymes  Recent  Labs Lab 08/04/15 1028  TROPONINI <0.03    Invalid input(s): POCBNP No results for input(s): DDIMER in the last 72 hours. No results for input(s): HGBA1C in the last 72 hours. No results for input(s): CHOL, HDL, LDLCALC, TRIG, CHOLHDL, LDLDIRECT in the last 72 hours. No results for input(s): TSH, T4TOTAL, T3FREE, THYROIDAB in the last 72 hours.  Invalid input(s): FREET3 No results for input(s): VITAMINB12, FOLATE, FERRITIN, TIBC, IRON, RETICCTPCT in the last 72 hours. No results for input(s): LIPASE, AMYLASE in the last 72 hours.  Urine Studies No results for input(s): UHGB, CRYS in the last 72 hours.  Invalid input(s): UACOL, UAPR, USPG, UPH, UTP, UGL, UKET, UBIL, UNIT, UROB, ULEU, UEPI, UWBC, URBC, UBAC, CAST, UCOM, BILUA  MICROBIOLOGY: Recent Results (from the past 240 hour(s))  Culture, blood (single)     Status: None (Preliminary result)   Collection Time: 08/04/15 10:28 AM  Result Value Ref Range Status   Specimen Description BLOOD RIGHT PICC LINE  Final   Special Requests BOTTLES DRAWN AEROBIC AND ANAEROBIC 4CC  Final   Culture   Final    NO GROWTH 4 DAYS Performed at [facility]    Report Status PENDING  Incomplete  Culture, blood (Routine X 2) w Reflex to ID Panel     Status: None (Preliminary result)   Collection Time: 08/04/15 12:05 PM  Result Value Ref Range Status   Specimen Description BLOOD RIGHT WRIST  Final   Special Requests IN PEDIATRIC BOTTLE  Final   Culture   Final    NO GROWTH 4 DAYS Performed at [facility]    Report Status PENDING  Incomplete  Culture, blood (Routine X 2) w Reflex to ID Panel     Status: None (Preliminary result)   Collection Time: 08/04/15 12:05 PM  Result Value Ref Range Status   Specimen Description BLOOD RIGHT HAND  Final   Special Requests IN PEDIATRIC BOTTLE   Final   Culture   Final    NO GROWTH 4 DAYS Performed at [facility]    Report Status PENDING  Incomplete  Urine culture     Status: None   Collection Time: 08/04/15 12:17 PM  Result Value Ref Range Status   Specimen Description URINE, CLEAN CATCH  Final   Special Requests NONE  Final   Culture   Final    NO GROWTH 1 DAY Performed at [facility]    Report Status 08/05/2015 FINAL  Final  Surgical pcr screen     Status: None   Collection Time: 08/07/15  2:01 PM  Result Value Ref Range Status   MRSA, PCR NEGATIVE NEGATIVE Final   Staphylococcus aureus NEGATIVE NEGATIVE Final    Comment:        The Xpert SA Assay (FDA approved for NASAL specimens in patients over 70 years of age), is one component of a comprehensive surveillance program.  Test performance has been validated by [facility] for patients greater than or equal to 51 year old. It is not intended to diagnose infection nor to guide or monitor treatment.     RADIOLOGY STUDIES/RESULTS: Dg Chest 2 View  08/04/2015  CLINICAL DATA:  Chest x-ray clearance prior to surgery. EXAM: CHEST  2 VIEW COMPARISON:  07/05/2015 FINDINGS: There is a right arm PICC line with tip in the right atrium. The heart size is normal. No pleural effusion or edema. There is no airspace consolidation identified. IMPRESSION: 1. No acute cardiopulmonary abnormalities. Electronically  Signed   By: Signa Kellaylor  Stroud M.D.   On: 08/04/2015 12:07   Dg Wrist Complete Left  08/04/2015  CLINICAL DATA:  Left wrist pain with swelling, limited movement. History distal left radius fracture in 2016. History of irrigation and debridement surgery of the left wrist on 07/08/2005. EXAM: LEFT WRIST - COMPLETE 3+ VIEW COMPARISON:  Comparison plain films of the left wrist dated 05/10/2015 and 04/07/2015. Comparison also made to wrist MRI dated 07/05/2015. FINDINGS: Again noted is the erosive change along the ulna margin of the distal left radius, compatible  with sequela of patient's previous osteomyelitis. This has not significantly progressed from the previous exam. No new erosions seen. 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 There appears to be some associated osseous fusion of the capitate and lunate bones. Again noted is severe joint space loss at the radiocarpal joint. There has been interval progression of healing at the distal left ulna fracture site, with exuberant callus formation. No acute fracture seen. Soft tissues about the left wrist are unremarkable. IMPRESSION: 1. Extensive erosive changes along the ulnar margin of the distal left radius, compatible with sequela of previous infection, not significantly progressed from the previous exam of 05/10/2015. Also no significant change of the severe radiocarpal joint space narrowing. 2. 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 Probable associated osseous fusion of the capitate and underlying lunate bone. 3. No new erosive change or fracture seen. Electronically Signed   By: Bary RichardStan  Maynard M.D.   On: 08/04/2015 11:55   Mr Wrist Left W Wo Contrast  08/05/2015  CLINICAL DATA:  History bacteremia.  Left wrist pain and swelling. EXAM: MR OF THE LEFT WRIST WITHOUT AND WITH CONTRAST TECHNIQUE: Multiplanar multisequence MR imaging of the left wrist was performed both before and after the administration of intravenous contrast. CONTRAST:  20mL MULTIHANCE GADOBENATE DIMEGLUMINE 529 MG/ML IV SOLN COMPARISON:  05/11/2015, 07/05/2015 FINDINGS: Severe patient motion degrading image quality limiting evaluation. Ligaments: Tear of the scapholunate ligament. Lunotriquetral ligament is not delineated. Triangular fibrocartilage: Macerated TFCC. Tendons: Severe tendinosis of the extensor carpi ulnaris with peripheral dislocation from the distal ulnar  groove. Otherwise Intact flexor and extensor compartment tendons. Carpal tunnel/median nerve: Normal carpal tunnel. Normal median nerve. Guyon's canal: Normal. Bones: There is no marrow edema in the distal radius, distal ulna, throughout the carpal bones and at the base of the metacarpals. There is erosion of the distal ulnar aspect of the radius adjacent to the radioulnar joint. There are erosive changes of the distal ulna with disruption of the distal radioulnar joint and cortical thickening, periosteal reaction and surrounding soft tissue edema involving the distal ulnar diaphysis. There is a 7.9 x 15.3 mm peripherally enhancing fluid collection along the volar aspect of the distal radioulnar joint. There is diffuse synovitis with enhancement Other: There is no soft tissue mass or hematoma. IMPRESSION: 1. Diffuse marrow edema throughout the carpus, distal radius, distal ulna and at the bases of the metacarpals with diffuse synovitis most consistent with septic arthritis. Cortical erosion of the ulnar aspect of the distal radius and of the distal ulna at the distal radioulnar joint with a enhancing distal radioulnar joint effusion measuring 7.9 x 15.3 mm most concerning for septic arthritis. The overall changes are similar in appearance with MRI dated 07/05/2015. 2. There is cortical thickening, periosteal reaction and surrounding soft tissue edema involving the  distal ulnar diaphysis which has progressed compared with the prior examination. Electronically Signed   By: Elige Ko   On: 08/05/2015 08:46    Jeoffrey Massed, MD  [facility] Pager:[phone]  If 7PM-7AM, please contact night-coverage [email or web address] Password [facility] 08/08/2015, 11:33 AM   LOS: 4 days

## 2015-08-08 NOTE — Anesthesia Postprocedure Evaluation (Signed)
Anesthesia Post Note  Patient: Gerhard MunchJimmy F Shupert  Procedure(s) Performed: Procedure(s) (LRB): IRRIGATION AND DEBRIDEMENT EXTREMITY (Left)  Patient location during evaluation: PACU Anesthesia Type: General Level of consciousness: awake and alert Pain management: pain level controlled Vital Signs Assessment: post-procedure vital signs reviewed and stable Respiratory status: spontaneous breathing, nonlabored ventilation and respiratory function stable Cardiovascular status: blood pressure returned to baseline and stable Postop Assessment: no signs of nausea or vomiting Anesthetic complications: no    Last Vitals:  Filed Vitals:   08/08/15 1730 08/08/15 1744  BP: 119/81 130/87  Pulse: 94 94  Temp:  36.5 C  Resp: 13 11    Last Pain:  Filed Vitals:   08/08/15 1744  PainSc: 0-No pain                 Zariana Strub,W. EDMOND

## 2015-08-08 NOTE — Progress Notes (Signed)
Pt brought back to floor from post op. Report received from RN at bedside. Pt stable and states he is hungry.

## 2015-08-08 NOTE — Anesthesia Preprocedure Evaluation (Addendum)
Anesthesia Evaluation  Patient identified by MRN, date of birth, ID band Patient awake    Reviewed: Allergy & Precautions, H&P , NPO status , Patient's Chart, lab work & pertinent test results  Airway Mallampati: II  TM Distance: >3 FB Neck ROM: Full    Dental no notable dental hx. (+) Teeth Intact, Dental Advisory Given   Pulmonary neg pulmonary ROS, former smoker,    Pulmonary exam normal breath sounds clear to auscultation       Cardiovascular hypertension, Pt. on medications + Peripheral Vascular Disease  + dysrhythmias  Rhythm:Regular Rate:Normal     Neuro/Psych Anxiety negative neurological ROS     GI/Hepatic Neg liver ROS, PUD, GERD  ,  Endo/Other  negative endocrine ROS  Renal/GU Renal disease  negative genitourinary   Musculoskeletal  (+) Arthritis , Osteoarthritis,    Abdominal   Peds  Hematology negative hematology ROS (+)   Anesthesia Other Findings   Reproductive/Obstetrics negative OB ROS                           Anesthesia Physical Anesthesia Plan  ASA: III  Anesthesia Plan: General   Post-op Pain Management:    Induction: Intravenous  Airway Management Planned: LMA  Additional Equipment:   Intra-op Plan:   Post-operative Plan: Extubation in OR  Informed Consent: I have reviewed the patients History and Physical, chart, labs and discussed the procedure including the risks, benefits and alternatives for the proposed anesthesia with the patient or authorized representative who has indicated his/her understanding and acceptance.   Dental advisory given  Plan Discussed with: CRNA  Anesthesia Plan Comments:         Anesthesia Quick Evaluation

## 2015-08-08 NOTE — Anesthesia Procedure Notes (Signed)
Procedure Name: LMA Insertion Date/Time: 08/08/2015 2:49 PM Performed by: Charm BargesBUTLER, Alishea Beaudin R Pre-anesthesia Checklist: Patient identified, Emergency Drugs available, Suction available, Patient being monitored and Timeout performed Patient Re-evaluated:Patient Re-evaluated prior to inductionOxygen Delivery Method: Circle system utilized Preoxygenation: Pre-oxygenation with 100% oxygen Intubation Type: IV induction Ventilation: Mask ventilation without difficulty LMA: LMA inserted LMA Size: 5.0 Number of attempts: 1 Placement Confirmation: positive ETCO2 Tube secured with: Tape Dental Injury: Teeth and Oropharynx as per pre-operative assessment

## 2015-08-09 ENCOUNTER — Encounter (HOSPITAL_COMMUNITY): Payer: Self-pay | Admitting: General Surgery

## 2015-08-09 LAB — PROTIME-INR
INR: 1.06 (ref 0.00–1.49)
PROTHROMBIN TIME: 14 s (ref 11.6–15.2)

## 2015-08-09 LAB — BASIC METABOLIC PANEL
Anion gap: 6 (ref 5–15)
BUN: 12 mg/dL (ref 6–20)
CALCIUM: 9.1 mg/dL (ref 8.9–10.3)
CO2: 30 mmol/L (ref 22–32)
CREATININE: 0.94 mg/dL (ref 0.61–1.24)
Chloride: 100 mmol/L — ABNORMAL LOW (ref 101–111)
GFR calc non Af Amer: >60 mL/min (ref >60)
Glucose, Bld: 143 mg/dL — ABNORMAL HIGH (ref 65–99)
Potassium: 4.7 mmol/L (ref 3.5–5.1)
SODIUM: 136 mmol/L (ref 135–145)

## 2015-08-09 LAB — CULTURE, BLOOD (ROUTINE X 2)
Culture: NO GROWTH
Culture: NO GROWTH

## 2015-08-09 LAB — CBC
HCT: 38.1 % — ABNORMAL LOW (ref 39.0–52.0)
Hemoglobin: 11.7 g/dL — ABNORMAL LOW (ref 13.0–17.0)
MCH: 26.9 pg (ref 26.0–34.0)
MCHC: 30.7 g/dL (ref 30.0–36.0)
MCV: 87.6 fL (ref 78.0–100.0)
PLATELETS: 244 10*3/uL (ref 150–400)
RBC: 4.35 MIL/uL (ref 4.22–5.81)
RDW: 17.1 % — AB (ref 11.5–15.5)
WBC: 8.7 10*3/uL (ref 4.0–10.5)

## 2015-08-09 LAB — HEPARIN LEVEL (UNFRACTIONATED)

## 2015-08-09 LAB — CULTURE, BLOOD (SINGLE): CULTURE: NO GROWTH

## 2015-08-09 MED ORDER — APIXABAN 5 MG PO TABS
5.0000 mg | ORAL_TABLET | Freq: Two times a day (BID) | ORAL | Status: DC
  Administered 2015-08-09: 5 mg via ORAL
  Filled 2015-08-09: qty 1

## 2015-08-09 MED ORDER — MORPHINE SULFATE ER 30 MG PO TBCR: 90.0000 mg | EXTENDED_RELEASE_TABLET | Freq: Two times a day (BID) | ORAL | Status: DC

## 2015-08-09 MED ORDER — POLYETHYLENE GLYCOL 3350 17 G PO PACK: 17.0000 g | PACK | Freq: Every day | ORAL | Status: DC

## 2015-08-09 MED ORDER — OXYCODONE HCL 30 MG PO TABS: 30.0000 mg | ORAL_TABLET | Freq: Four times a day (QID) | ORAL | Status: DC

## 2015-08-09 MED ORDER — SODIUM CHLORIDE 0.9 % IV SOLN: 1250.0000 mg | Freq: Two times a day (BID) | INTRAVENOUS | Status: DC

## 2015-08-09 MED ORDER — APIXABAN 5 MG PO TABS
5.0000 mg | ORAL_TABLET | Freq: Two times a day (BID) | ORAL | Status: DC
Start: 2015-08-09 — End: 2015-08-30

## 2015-08-09 NOTE — Discharge Summary (Signed)
PATIENT DETAILS Name: [name] Age: 60 y.o. Sex: male Date of Birth: [birth year] MRN: [id number]. Admitting Physician: Clydia Llano, MD KZS:WFUXNAT, MIKE, MD  Admit Date: 08/04/2015 Discharge date: 08/09/2015  Recommendations for Outpatient Follow-up:  1. IV vancomycin for 6 weeks, please ensure follow up with infectious disease before discontinuing antibiotics.  2. CBC/chemistries/Vanco trough levels weekly-please fax results to infectious disease office  3. PICC line care per protocol, please ensure PICC line is discontinued once patient completes IV antibiotics.  4. New Medication-Eliquis-coumadin discontinued 5. Had DVT in May 18 2015-may need discontinuation after a total of 6 months of treatment. Defer to PCP 6. Please follow blood cultures until final, intraoperative cultures also pending-please follow   PRIMARY DISCHARGE DIAGNOSIS:  Principal Problem:   Acute osteomyelitis of left ulna (HCC) Active Problems:   Chronic pain syndrome   Chronic use of opiate drugs therapeutic purposes   Hypokalemia   Bacteremia due to Staphylococcus aureus   Acute deep vein thrombosis (DVT) of distal end of right lower extremity (HCC)   Osteomyelitis (HCC)      PAST MEDICAL HISTORY: Past Medical History  Diagnosis Date  . Hypertension   . Chronic knee pain   . Hyperlipemia   . PUD (peptic ulcer disease)   . Dysrhythmia   . Peripheral vascular disease (HCC)   . Anxiety   . GERD (gastroesophageal reflux disease)   . 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 Wound of right ankle 06/05/2015    DISCHARGE MEDICATIONS: Current Discharge Medication List    START taking these medications   Details  apixaban (ELIQUIS) 5 MG TABS tablet Take 1 tablet (5 mg total) by mouth 2 (two) times daily. Qty: 60 tablet, Refills: 0    vancomycin 1,250 mg in sodium chloride 0.9 % 250 mL Inject 1,250 mg into the vein every 12 (twelve)  hours. Qty: 60 Syringe, Refills: 0      CONTINUE these medications which have CHANGED   Details  morphine (MS CONTIN) 30 MG 12 hr tablet Take 3 tablets (90 mg total) by mouth every 12 (twelve) hours. Qty: 7 tablet, Refills: 0    oxycodone (ROXICODONE) 30 MG immediate release tablet Take 1 tablet (30 mg total) by mouth every 6 (six) hours. scheduled Qty: 13 tablet, Refills: 0    polyethylene glycol (MIRALAX / GLYCOLAX) packet Take 17 g by mouth daily. Qty: 100 each, Refills: 0      CONTINUE these medications which have NOT CHANGED   Details  albuterol (PROAIR HFA) 108 (90 BASE) MCG/ACT inhaler Inhale 1 puff into the lungs every 6 (six) hours as needed for wheezing or shortness of breath.     atorvastatin (LIPITOR) 80 MG tablet Take 1 tablet (80 mg total) by mouth daily. Qty: 30 tablet, Refills: 1    Diclofenac-Misoprostol 75-0.2 MG TBEC     docusate sodium (COLACE) 100 MG capsule Take 1 capsule (100 mg total) by mouth 2 (two) times daily. Qty: 10 capsule, Refills: 0    flecainide (TAMBOCOR) 100 MG tablet Take 1 tablet (100 mg total) by mouth every 12 (twelve) hours. Qty: 60 tablet, Refills: 1    gabapentin (NEURONTIN) 100 MG capsule Take 1 capsule (100 mg total) by mouth 3 (three) times daily. Qty: 90 capsule, Refills: 1    Metoprolol Tartrate 75 MG TABS Take 75 mg by mouth 2 (two) times daily. Qty: 60 tablet, Refills: 1    Multiple Vitamin (MULTIVITAMIN WITH  MINERALS) TABS tablet Take 1 tablet by mouth daily.    naloxegol oxalate (MOVANTIK) 25 MG TABS tablet Take 1 tablet (25 mg total) by mouth daily before breakfast. Qty: 30 tablet, Refills: 0    SUMAtriptan (IMITREX) 50 MG tablet Take 50 mg by mouth daily as needed for migraine or headache. Maximum 2 doses in 2 days Refills: 0    temazepam (RESTORIL) 15 MG capsule Take 15-30 mg by mouth at bedtime. Refills: 0    trifluridine (VIROPTIC) 1 % ophthalmic solution Place 1 drop into both eyes 2 (two) times daily as needed  (breakouts (blurry, matted eyelids)).    venlafaxine XR (EFFEXOR-XR) 75 MG 24 hr capsule Take 1 capsule (75 mg total) by mouth daily with breakfast. Qty: 30 capsule, Refills: 1    DULoxetine (CYMBALTA) 30 MG capsule Take 60 mg by mouth daily at 12 noon. Refills: 0    famciclovir (FAMVIR) 500 MG tablet Take 500 mg by mouth 2 (two) times daily as needed (fever blisters).     testosterone cypionate (DEPOTESTOSTERONE CYPIONATE) 200 MG/ML injection Inject 100 mg into the muscle every 14 (fourteen) days. Reported on 08/05/2015 Refills: 0      STOP taking these medications     ceFAZolin (ANCEF) 2-3 GM-% SOLR      naproxen (NAPROSYN) 500 MG tablet      warfarin (COUMADIN) 5 MG tablet      clonazePAM (KLONOPIN) 2 MG tablet      methocarbamol (ROBAXIN) 500 MG tablet         ALLERGIES:   Allergies  Allergen Reactions  . Verapamil Other (See Comments)    Causes pvc's  . Imitrex [Sumatriptan] Other (See Comments)    Chest pain, 2004 (tolerates 50 mg prn)    BRIEF HPI:  See H&P, Labs, Consult and Test reports for all details in brief, patient [name] is a 60 y.o. male with extensive past medical history including SVT, chronic diastolic CHF and pulmonary hypertension, recent multiple admission to the hospital with MSSA bacteremia, right femoral artery mycotic aneurysm and left wrist osteomyelitis. Patient presented to the emergency room on 1/8 with complaints of worsening left wrist pain. He was subsequently admitted for further evaluation and treatment  CONSULTATIONS:   ID and orthopedic surgery  PERTINENT RADIOLOGIC STUDIES: Dg Chest 2 View  08/04/2015  CLINICAL DATA:  Chest x-ray clearance prior to surgery. EXAM: CHEST  2 VIEW COMPARISON:  07/05/2015 FINDINGS: There is a right arm PICC line with tip in the right atrium. The heart size is normal. No pleural effusion or edema. There is no airspace consolidation identified. IMPRESSION: 1. No acute cardiopulmonary abnormalities.  Electronically Signed   By: Signa Kell M.D.   On: 08/04/2015 12:07   Dg Wrist Complete Left  08/04/2015  CLINICAL DATA:  Left wrist pain with swelling, limited movement. History distal left radius fracture in 2016. History of irrigation and debridement surgery of the left wrist on 07/08/2005. EXAM: LEFT WRIST - COMPLETE 3+ VIEW COMPARISON:  Comparison plain films of the left wrist dated 05/10/2015 and 04/07/2015. Comparison also made to wrist MRI dated 07/05/2015. FINDINGS: Again noted is the erosive change along the ulna margin of the distal left radius, compatible with sequela of patient's previous osteomyelitis. This has not significantly progressed from the previous exam. No new erosions seen. 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 There appears to be some associated osseous fusion of  the capitate and lunate bones. Again noted is severe joint space loss at the radiocarpal joint. There has been interval progression of healing at the distal left ulna fracture site, with exuberant callus formation. No acute fracture seen. Soft tissues about the left wrist are unremarkable. IMPRESSION: 1. Extensive erosive changes along the ulnar margin of the distal left radius, compatible with sequela of previous infection, not significantly progressed from the previous exam of 05/10/2015. Also no significant change of the severe radiocarpal joint space narrowing. 2. 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 Probable associated osseous fusion of the capitate and underlying lunate bone. 3. No new erosive change or fracture seen. Electronically Signed   By: Bary Richard M.D.   On: 08/04/2015 11:55   Mr Wrist Left W Wo Contrast  08/05/2015  CLINICAL DATA:  History bacteremia.  Left wrist pain and swelling. EXAM: MR OF THE LEFT WRIST WITHOUT AND  WITH CONTRAST TECHNIQUE: Multiplanar multisequence MR imaging of the left wrist was performed both before and after the administration of intravenous contrast. CONTRAST:  20mL MULTIHANCE GADOBENATE DIMEGLUMINE 529 MG/ML IV SOLN COMPARISON:  05/11/2015, 07/05/2015 FINDINGS: Severe patient motion degrading image quality limiting evaluation. Ligaments: Tear of the scapholunate ligament. Lunotriquetral ligament is not delineated. Triangular fibrocartilage: Macerated TFCC. Tendons: Severe tendinosis of the extensor carpi ulnaris with peripheral dislocation from the distal ulnar groove. Otherwise Intact flexor and extensor compartment tendons. Carpal tunnel/median nerve: Normal carpal tunnel. Normal median nerve. Guyon's canal: Normal. Bones: There is no marrow edema in the distal radius, distal ulna, throughout the carpal bones and at the base of the metacarpals. There is erosion of the distal ulnar aspect of the radius adjacent to the radioulnar joint. There are erosive changes of the distal ulna with disruption of the distal radioulnar joint and cortical thickening, periosteal reaction and surrounding soft tissue edema involving the distal ulnar diaphysis. There is a 7.9 x 15.3 mm peripherally enhancing fluid collection along the volar aspect of the distal radioulnar joint. There is diffuse synovitis with enhancement Other: There is no soft tissue mass or hematoma. IMPRESSION: 1. Diffuse marrow edema throughout the carpus, distal radius, distal ulna and at the bases of the metacarpals with diffuse synovitis most consistent with septic arthritis. Cortical erosion of the ulnar aspect of the distal radius and of the distal ulna at the distal radioulnar joint with a enhancing distal radioulnar joint effusion measuring 7.9 x 15.3 mm most concerning for septic arthritis. The overall changes are similar in appearance with MRI dated 07/05/2015. 2. There is cortical thickening, periosteal reaction and surrounding soft tissue  edema involving the distal ulnar diaphysis which has progressed compared with the prior examination. Electronically Signed   By: Elige Ko   On: 08/05/2015 08:46     PERTINENT LAB RESULTS: CBC:  Recent Labs  08/08/15 0216 08/09/15 0444  WBC 5.5 8.7  HGB 11.9* 11.7*  HCT 38.4* 38.1*  PLT 247 244   CMET CMP     Component Value Date/Time   NA 136 08/09/2015 0444   K 4.7 08/09/2015 0444   CL 100* 08/09/2015 0444   CO2 30 08/09/2015 0444   GLUCOSE 143* 08/09/2015 0444   BUN 12 08/09/2015 0444   CREATININE 0.94 08/09/2015 0444   CALCIUM 9.1 08/09/2015 0444   PROT 7.2 08/04/2015 1028   ALBUMIN 3.5 08/04/2015 1028   AST 14* 08/04/2015 1028   ALT 12* 08/04/2015 1028   ALKPHOS 105 08/04/2015  1028   BILITOT 0.4 08/04/2015 1028   GFRNONAA >60 08/09/2015 0444   GFRAA >60 08/09/2015 0444    GFR Estimated Creatinine Clearance: 103.6 mL/min (by C-G formula based on Cr of 0.94). No results for input(s): LIPASE, AMYLASE in the last 72 hours. No results for input(s): CKTOTAL, CKMB, CKMBINDEX, TROPONINI in the last 72 hours. Invalid input(s): POCBNP No results for input(s): DDIMER in the last 72 hours. No results for input(s): HGBA1C in the last 72 hours. No results for input(s): CHOL, HDL, LDLCALC, TRIG, CHOLHDL, LDLDIRECT in the last 72 hours. No results for input(s): TSH, T4TOTAL, T3FREE, THYROIDAB in the last 72 hours.  Invalid input(s): FREET3 No results for input(s): VITAMINB12, FOLATE, FERRITIN, TIBC, IRON, RETICCTPCT in the last 72 hours. Coags:  Recent Labs  08/08/15 0216 08/09/15 0444  INR 1.10 1.06   Microbiology: Recent Results (from the past 240 hour(s))  Culture, blood (single)     Status: None   Collection Time: 08/04/15 10:28 AM  Result Value Ref Range Status   Specimen Description BLOOD RIGHT PICC LINE  Final   Special Requests BOTTLES DRAWN AEROBIC AND ANAEROBIC 4CC  Final   Culture   Final    NO GROWTH 5 DAYS Performed at [facility]     Report Status 08/09/2015 FINAL  Final  Culture, blood (Routine X 2) w Reflex to ID Panel     Status: None   Collection Time: 08/04/15 12:05 PM  Result Value Ref Range Status   Specimen Description BLOOD RIGHT WRIST  Final   Special Requests IN PEDIATRIC BOTTLE  Final   Culture   Final    NO GROWTH 5 DAYS Performed at [facility]    Report Status 08/09/2015 FINAL  Final  Culture, blood (Routine X 2) w Reflex to ID Panel     Status: None   Collection Time: 08/04/15 12:05 PM  Result Value Ref Range Status   Specimen Description BLOOD RIGHT HAND  Final   Special Requests IN PEDIATRIC BOTTLE  Final   Culture   Final    NO GROWTH 5 DAYS Performed at [facility]    Report Status 08/09/2015 FINAL  Final  Urine culture     Status: None   Collection Time: 08/04/15 12:17 PM  Result Value Ref Range Status   Specimen Description URINE, CLEAN CATCH  Final   Special Requests NONE  Final   Culture   Final    NO GROWTH 1 DAY Performed at [facility]    Report Status 08/05/2015 FINAL  Final  Surgical pcr screen     Status: None   Collection Time: 08/07/15  2:01 PM  Result Value Ref Range Status   MRSA, PCR NEGATIVE NEGATIVE Final   Staphylococcus aureus NEGATIVE NEGATIVE Final    Comment:        The Xpert SA Assay (FDA approved for NASAL specimens in patients over 49 years of age), is one component of a comprehensive surveillance program.  Test performance has been validated by [facility] for patients greater than or equal to 40 year old. It is not intended to diagnose infection nor to guide or monitor treatment.   Body fluid culture     Status: None (Preliminary result)   Collection Time: 08/08/15  3:28 PM  Result Value Ref Range Status   Specimen Description SYNOVIAL LEFT WRIST  Final   Special Requests   Final    DISTAL RADIO ULNA JOINT PATIENT ON  FOLLOWING VANC AND ANCEF FLUID ON SWAB   Gram Stain   Final    FEW WBC PRESENT,BOTH PMN AND  MONONUCLEAR NO ORGANISMS SEEN    Culture NO GROWTH < 24 HOURS  Final   Report Status PENDING  Incomplete  Tissue culture     Status: None (Preliminary result)   Collection Time: 08/08/15  3:28 PM  Result Value Ref Range Status   Specimen Description TISSUE LEFT WRIST  Final   Special Requests PATIENT ON FOLLOWING VANC AND ANCEF  Final   Gram Stain   Final    NO WBC SEEN NO ORGANISMS SEEN Performed at [facility]    Culture   Final    NO GROWTH 1 DAY Performed at [facility]    Report Status PENDING  Incomplete  Anaerobic culture     Status: None (Preliminary result)   Collection Time: 08/08/15  3:28 PM  Result Value Ref Range Status   Specimen Description TISSUE LEFT WRIST  Final   Special Requests PATIENT ON FOLLOWING VANC AND ANCEF  Final   Gram Stain   Final    NO WBC SEEN NO ORGANISMS SEEN Performed at [facility]    Culture PENDING  Incomplete   Report Status PENDING  Incomplete     BRIEF HOSPITAL COURSE:  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 He has been maintained on IV cefazolin since that time. He continued to have close outpatient follow-up with infectious disease and orthopedics. He presented to the hospital with worsening left wrist swelling, MRI of the wrist revealed progressive osteomyelitis. He was subsequently seen in consultation by both infectious disease and orthopedics. He was subsequently started on IV vancomycin in addition to IV Ancef. He underwent incision and drainage on 1/12. Postoperatively, he continues to do well, intraoperative cultures continue to be pending. Infectious disease now recommends stopping IV Ancef and starting IV vancomycin for at least 6 weeks. Patient was seen by orthopedics-Dr. Erlinda Hong subsequently. For discharge  today. Patient has been instructed to follow-up with both infectious disease and [name] closely.  Active Problems: Recent history of MSSA bacteremia: On IV Ancef prior to this admission, continue with antibiotics. Blood cultures on 1/8 negative so far.the mitral abuse now changed to IV vancomycin. Please see above   History of DVT to right lower extremity: Coumadin was held, patient was started on IV heparin. Spoke with patient 1/12-coumadin vs NOAC discussed-he would like to start Eliquis prior to d/c.   Chronic pain syndrome: continue home doses of narcotics.Patient requests that I provide him a few days supply of his chronic narcotics till he sees his pain management M.D. middle of next week.  Dyslipidemia: Continue statin  Hypertension: Controlled, continue metoprolol  History of SVT: Continue flecainide, metoprolol.  TODAY-DAY OF DISCHARGE:  Subjective:   Akia Desroches today has no headache,no chest abdominal pain,no new weakness tingling or numbness, feels much better wants to go home today.   Objective:   Blood pressure 118/69, pulse 65, temperature 98.2 F (36.8 C), temperature source Oral, resp. rate 16, height 6\' 3"  (1.905 m), weight 99.791 kg (220 lb), SpO2 100 %.  Intake/Output Summary (Last 24 hours) at 08/09/15 1454 Last data filed at 08/09/15 1423  Gross per 24 hour  Intake   2420 ml  Output  2225 ml  Net    195 ml   Filed Weights   08/04/15 1007  Weight: 99.791 kg (220 lb)    Exam Awake Alert, Oriented *3, No new F.N deficits, Normal affect [place].AT,PERRAL Supple Neck,No JVD, No cervical lymphadenopathy appriciated.  Symmetrical Chest wall movement, Good air movement bilaterally, CTAB RRR,No Gallops,Rubs or new Murmurs, No Parasternal Heave +ve B.Sounds, Abd Soft, Non tender, No organomegaly appriciated, No rebound -guarding or rigidity. No Cyanosis, Clubbing or edema, No new Rash or bruise  DISCHARGE CONDITION: Stable  DISPOSITION: Home with home  health services  DISCHARGE INSTRUCTIONS:    Activity:  As tolerated with Full fall precautions use walker/cane & assistance as needed  Get Medicines reviewed and adjusted: Please take all your medications with you for your next visit with your Primary MD  Please request your Primary MD to go over all hospital tests and procedure/radiological results at the follow up, please ask your Primary MD to get all Hospital records sent to his/her office.  If you experience worsening of your admission symptoms, develop shortness of breath, life threatening emergency, suicidal or homicidal thoughts you must seek medical attention immediately by calling 911 or calling your MD immediately  if symptoms less severe.  You must read complete instructions/literature along with all the possible adverse reactions/side effects for all the Medicines you take and that have been prescribed to you. Take any new Medicines after you have completely understood and accpet all the possible adverse reactions/side effects.   Do not drive when taking Pain medications.   Do not take more than prescribed Pain, Sleep and Anxiety Medications  Special Instructions: If you have smoked or chewed Tobacco  in the last 2 yrs please stop smoking, stop any regular Alcohol  and or any Recreational drug use.  Wear Seat belts while driving.  Please note  You were cared for by a hospitalist during your hospital stay. Once you are discharged, your primary care physician will handle any further medical issues. Please note that NO REFILLS for any discharge medications will be authorized once you are discharged, as it is imperative that you return to your primary care physician (or establish a relationship with a primary care physician if you do not have one) for your aftercare needs so that they can reassess your need for medications and monitor your lab values.   Diet recommendation: Heart Healthy diet  Discharge Instructions    Call  MD for:  redness, tenderness, or signs of infection (pain, swelling, redness, odor or green/yellow discharge around incision site)    Complete by:  As directed      Diet - low sodium heart healthy    Complete by:  As directed      Increase activity slowly    Complete by:  As directed            Follow-up Information    Follow up with [facility].   Why:  IV antibiotics and HH RN.   Contact information:   [address] [place] [address] [phone]       Follow up with Molinda Bailiff, MD. Schedule an appointment as soon as possible for a visit in 1 week.   Specialty:  General Surgery   Contact information:   [address] [place] [address] [phone]       Follow up with [name], MD. Schedule an appointment as soon as possible for a visit in 1 week.  Specialty:  Internal Medicine   Why:  Hospital follow up   Contact information:   [address] [facility] [address] [phone]       Follow up with Cliffton AstersJohn Campbell, MD. Call in 1 week.   Specialty:  Infectious Diseases   Contact information:   [address] [place] [address] [phone]       Total Time spent on discharge equals  45 minutes.  SignedJeoffrey Massed: GHIMIRE,SHANKER 08/09/2015 2:54 PM

## 2015-08-09 NOTE — Progress Notes (Addendum)
        [facility]    Date of Admission:  07/24/2015   Total days of antibiotics 131               Reason for Consult: Wrist infection despite 4 months of IV cefazolin.    Principal Problem: Progressive osteomyelitis of the left wrist with septic arthritis.  Assessment: [name] has a progressive wrist infection despite 4 months of IV cefazolin therapy. His last blood culture before current admission, was on 07/09/2015 where he grew a bacillus species. While this is commonly a contaminant we are concerned for the possibility of invasive bone and joint infection. There are case reports of this happening with bacillus species. Dr. Izora Ribasoley preformed incision and drainage of left wrist on 08/07/2105. Deep tissue submitted for routine gram stain, aerobic and anaerobic cultures which will be followed closely. Since patient has received 4 months of IV cefazolin and infection progressed, we will discontinue cefazolin and treat with vancomycin to cover bacillus species. Will follow up with cultures in clinic for further decisions in therapy.  Recommendations: 1. Continue vancomycin for a likely duration of 6 weeks 2. Stop cefazolin 3. Follow blood cultures  This was discussed with Dr. Cliffton AstersJohn Dewight Catino of Infectious Diseases. Will have patient follow-up in clinic for decisions on exact duration of therapy and any additional changes to antibiotic therapy.  Sherron MondayAubrey N. Jones, PharmD Clinical Pharmacy Resident Pager: [phone] 08/09/2015 11:11 AM  Addendum: I have seen and examined [name] today and discussed his care with [name]. He underwent repeat left wrist debridement yesterday. Operative Gram stain is negative and cultures are pending. I agree with the plan to stop cefazolin now. He has had lengthy therapy for his original MSSA isolate. I will continue vancomycin to cover for the possibility of bacillus infection based on December cultures. I will arrange follow-up in our  clinic within one week. I will sign off now.  Cliffton AstersJohn Terriann Difonzo, MD [facility] [phone] pager   [phone] cell 08/09/2015, 12:53 PM

## 2015-08-09 NOTE — Progress Notes (Signed)
S: pt offers no complaints, wrist sore  O:Blood pressure 118/69, pulse 65, temperature 98.2 F (36.8 C), temperature source Oral, resp. rate 16, height 6\' 3"  (1.905 m), weight 99.791 kg (220 lb), SpO2 100 %.  Min output from drain Finger tips pink, dressing/splint in place - wound not evaluated  A:s/p I&D, debridement L wrist   P:ok to be d/c'd , f/u next week in office for wound check, abx per ID

## 2015-08-09 NOTE — Discharge Instructions (Signed)
Information on my medicine - ELIQUIS (apixaban)  This medication education was reviewed with me or my healthcare representative as part of my discharge preparation.   Why was Eliquis prescribed for you? Eliquis was prescribed to treat blood clots that may have been found in the veins of your legs (deep vein thrombosis) or in your lungs (pulmonary embolism) and to reduce the risk of them occurring again.  What do You need to know about Eliquis ? Take ELIQUIS dose of ONE 5 mg tablet taken TWICE daily.  Eliquis may be taken with or without food.   Try to take the dose about the same time in the morning and in the evening. If you have difficulty swallowing the tablet whole please discuss with your pharmacist how to take the medication safely.  Take Eliquis exactly as prescribed and DO NOT stop taking Eliquis without talking to the doctor who prescribed the medication.  Stopping may increase your risk of developing a new blood clot.  Refill your prescription before you run out.  After discharge, you should have regular check-up appointments with your healthcare provider that is prescribing your Eliquis.    What do you do if you miss a dose? If a dose of ELIQUIS is not taken at the scheduled time, take it as soon as possible on the same day and twice-daily administration should be resumed. The dose should not be doubled to make up for a missed dose.  Important Safety Information A possible side effect of Eliquis is bleeding. You should call your healthcare provider right away if you experience any of the following: ? Bleeding from an injury or your nose that does not stop. ? Unusual colored urine (red or dark brown) or unusual colored stools (red or black). ? Unusual bruising for unknown reasons. ? A serious fall or if you hit your head (even if there is no bleeding).  Some medicines may interact with Eliquis and might increase your risk of bleeding or clotting while on Eliquis. To  help avoid this, consult your healthcare provider or pharmacist prior to using any new prescription or non-prescription medications, including herbals, vitamins, non-steroidal anti-inflammatory drugs (NSAIDs) and supplements.  This website has more information on Eliquis (apixaban): http://www.eliquis.com/eliquis/home

## 2015-08-09 NOTE — Progress Notes (Signed)
Nsg Discharge Note  Admit Date:  08/04/2015 Discharge date: 08/09/2015   Craig MunchJimmy F [name] to be D/C'd Home per MD order.  AVS completed.  Copy for chart, and copy for patient signed, and dated. Patient/caregiver able to verbalize understanding.  Discharge Medication:   Medication List    STOP taking these medications        ceFAZolin 2-3 GM-% Solr  Commonly known as:  ANCEF     naproxen 500 MG tablet  Commonly known as:  NAPROSYN     warfarin 5 MG tablet  Commonly known as:  COUMADIN      TAKE these medications        apixaban 5 MG Tabs tablet  Commonly known as:  ELIQUIS  Take 1 tablet (5 mg total) by mouth 2 (two) times daily.     atorvastatin 80 MG tablet  Commonly known as:  LIPITOR  Take 1 tablet (80 mg total) by mouth daily.     Diclofenac-Misoprostol 75-0.2 MG Tbec     docusate sodium 100 MG capsule  Commonly known as:  COLACE  Take 1 capsule (100 mg total) by mouth 2 (two) times daily.     DULoxetine 30 MG capsule  Commonly known as:  CYMBALTA  Take 60 mg by mouth daily at 12 noon.     famciclovir 500 MG tablet  Commonly known as:  FAMVIR  Take 500 mg by mouth 2 (two) times daily as needed (fever blisters).     flecainide 100 MG tablet  Commonly known as:  TAMBOCOR  Take 1 tablet (100 mg total) by mouth every 12 (twelve) hours.     gabapentin 100 MG capsule  Commonly known as:  NEURONTIN  Take 1 capsule (100 mg total) by mouth 3 (three) times daily.     Metoprolol Tartrate 75 MG Tabs  Take 75 mg by mouth 2 (two) times daily.     morphine 30 MG 12 hr tablet  Commonly known as:  MS CONTIN  Take 3 tablets (90 mg total) by mouth every 12 (twelve) hours.     multivitamin with minerals Tabs tablet  Take 1 tablet by mouth daily.     naloxegol oxalate 25 MG Tabs tablet  Commonly known as:  MOVANTIK  Take 1 tablet (25 mg total) by mouth daily before breakfast.     oxycodone 30 MG immediate release tablet  Commonly known as:  ROXICODONE  Take 1 tablet (30  mg total) by mouth every 6 (six) hours. scheduled     polyethylene glycol packet  Commonly known as:  MIRALAX / GLYCOLAX  Take 17 g by mouth daily.     PROAIR HFA 108 (90 Base) MCG/ACT inhaler  Generic drug:  albuterol  Inhale 1 puff into the lungs every 6 (six) hours as needed for wheezing or shortness of breath.     SUMAtriptan 50 MG tablet  Commonly known as:  IMITREX  Take 50 mg by mouth daily as needed for migraine or headache. Maximum 2 doses in 2 days     temazepam 15 MG capsule  Commonly known as:  RESTORIL  Take 15-30 mg by mouth at bedtime.     testosterone cypionate 200 MG/ML injection  Commonly known as:  DEPOTESTOSTERONE CYPIONATE  Inject 100 mg into the muscle every 14 (fourteen) days. Reported on 08/05/2015     trifluridine 1 % ophthalmic solution  Commonly known as:  VIROPTIC  Place 1 drop into both eyes 2 (two) times daily as needed (  breakouts (blurry, matted eyelids)).     vancomycin 1,250 mg in sodium chloride 0.9 % 250 mL  Inject 1,250 mg into the vein every 12 (twelve) hours.     venlafaxine XR 75 MG 24 hr capsule  Commonly known as:  EFFEXOR-XR  Take 1 capsule (75 mg total) by mouth daily with breakfast.        Discharge Assessment: Filed Vitals:   08/09/15 0703 08/09/15 1416  BP: 113/69 118/69  Pulse: 76 65  Temp: 97.9 F (36.6 C) 98.2 F (36.8 C)  Resp: 18 16   Skin clean, dry and intact without evidence of skin break down, no evidence of skin tears noted. IV SL from PICC site, pt to go home with PICC for [facility] to continue antibiotics at hom. Site without signs and symptoms of complications - no redness or edema noted at insertion site, patient denies c/o pain - only slight tenderness at site.   D/c Instructions-Education: Discharge instructions given to patient/family with verbalized understanding. D/c education completed with patient/family including follow up instructions, medication list, d/c activities limitations if indicated,  with other d/c instructions as indicated by MD - patient able to verbalize understanding, all questions fully answered. Patient instructed to return to ED, call 911, or call MD for any changes in condition.  Patient escorted via WC, and D/C home via private auto.  Camillo Flaming, RN 08/09/2015 3:51 PM

## 2015-08-11 ENCOUNTER — Telehealth: Payer: Self-pay | Admitting: Internal Medicine

## 2015-08-11 NOTE — Telephone Encounter (Signed)
[name] called this morning to let me that he had developed some mild ringing in his left ear shortly after discharge on 08/09/2015. 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  It is possible that he has early vancomycin-induced ototoxicity. I asked him to call her clinic early this week if the ringing persist. If so I suggested we make a change in antimicrobial therapy. He needs to be seen in clinic as soon as possible.

## 2015-08-12 LAB — TISSUE CULTURE
Culture: NO GROWTH
GRAM STAIN: NONE SEEN

## 2015-08-12 LAB — BODY FLUID CULTURE: CULTURE: NO GROWTH

## 2015-08-13 LAB — ANAEROBIC CULTURE: Gram Stain: NONE SEEN

## 2015-08-15 ENCOUNTER — Encounter (HOSPITAL_COMMUNITY): Payer: Self-pay | Admitting: Emergency Medicine

## 2015-08-15 ENCOUNTER — Inpatient Hospital Stay (HOSPITAL_COMMUNITY)
Admission: EM | Admit: 2015-08-15 | Discharge: 2015-08-21 | DRG: 493 | Disposition: A | Discharge status: Rehabilitation Facility | Payer: Medicaid Other | Attending: Orthopaedic Surgery | Admitting: Orthopaedic Surgery

## 2015-08-15 ENCOUNTER — Emergency Department (HOSPITAL_COMMUNITY): Payer: Medicaid Other

## 2015-08-15 DIAGNOSIS — W109XXA Fall (on) (from) unspecified stairs and steps, initial encounter: Secondary | ICD-10-CM | POA: Diagnosis present

## 2015-08-15 DIAGNOSIS — A4101 Sepsis due to Methicillin susceptible Staphylococcus aureus: Secondary | ICD-10-CM | POA: Diagnosis not present

## 2015-08-15 DIAGNOSIS — S82252A Displaced comminuted fracture of shaft of left tibia, initial encounter for closed fracture: Secondary | ICD-10-CM | POA: Diagnosis present

## 2015-08-15 DIAGNOSIS — I471 Supraventricular tachycardia, unspecified: Secondary | ICD-10-CM | POA: Insufficient documentation

## 2015-08-15 DIAGNOSIS — G629 Polyneuropathy, unspecified: Secondary | ICD-10-CM | POA: Diagnosis present

## 2015-08-15 DIAGNOSIS — E785 Hyperlipidemia, unspecified: Secondary | ICD-10-CM | POA: Diagnosis present

## 2015-08-15 DIAGNOSIS — Z79899 Other long term (current) drug therapy: Secondary | ICD-10-CM

## 2015-08-15 DIAGNOSIS — Z89411 Acquired absence of right great toe: Secondary | ICD-10-CM

## 2015-08-15 DIAGNOSIS — B958 Unspecified staphylococcus as the cause of diseases classified elsewhere: Secondary | ICD-10-CM | POA: Diagnosis not present

## 2015-08-15 DIAGNOSIS — G8918 Other acute postprocedural pain: Secondary | ICD-10-CM | POA: Diagnosis not present

## 2015-08-15 DIAGNOSIS — S82202S Unspecified fracture of shaft of left tibia, sequela: Secondary | ICD-10-CM | POA: Diagnosis not present

## 2015-08-15 DIAGNOSIS — R6521 Severe sepsis with septic shock: Secondary | ICD-10-CM | POA: Diagnosis not present

## 2015-08-15 DIAGNOSIS — Z8781 Personal history of (healed) traumatic fracture: Secondary | ICD-10-CM

## 2015-08-15 DIAGNOSIS — I5032 Chronic diastolic (congestive) heart failure: Secondary | ICD-10-CM | POA: Diagnosis present

## 2015-08-15 DIAGNOSIS — A419 Sepsis, unspecified organism: Secondary | ICD-10-CM | POA: Diagnosis not present

## 2015-08-15 DIAGNOSIS — R7881 Bacteremia: Secondary | ICD-10-CM | POA: Diagnosis not present

## 2015-08-15 DIAGNOSIS — I2690 Septic pulmonary embolism without acute cor pulmonale: Secondary | ICD-10-CM | POA: Diagnosis not present

## 2015-08-15 DIAGNOSIS — Z87891 Personal history of nicotine dependence: Secondary | ICD-10-CM

## 2015-08-15 DIAGNOSIS — B9562 Methicillin resistant Staphylococcus aureus infection as the cause of diseases classified elsewhere: Secondary | ICD-10-CM | POA: Diagnosis present

## 2015-08-15 DIAGNOSIS — I10 Essential (primary) hypertension: Secondary | ICD-10-CM | POA: Insufficient documentation

## 2015-08-15 DIAGNOSIS — M86632 Other chronic osteomyelitis, left radius and ulna: Secondary | ICD-10-CM | POA: Diagnosis present

## 2015-08-15 DIAGNOSIS — R6520 Severe sepsis without septic shock: Secondary | ICD-10-CM | POA: Diagnosis not present

## 2015-08-15 DIAGNOSIS — F419 Anxiety disorder, unspecified: Secondary | ICD-10-CM | POA: Diagnosis present

## 2015-08-15 DIAGNOSIS — Z79891 Long term (current) use of opiate analgesic: Secondary | ICD-10-CM

## 2015-08-15 DIAGNOSIS — Z96653 Presence of artificial knee joint, bilateral: Secondary | ICD-10-CM | POA: Diagnosis present

## 2015-08-15 DIAGNOSIS — I96 Gangrene, not elsewhere classified: Secondary | ICD-10-CM | POA: Diagnosis not present

## 2015-08-15 DIAGNOSIS — L02519 Cutaneous abscess of unspecified hand: Secondary | ICD-10-CM | POA: Diagnosis not present

## 2015-08-15 DIAGNOSIS — M86132 Other acute osteomyelitis, left radius and ulna: Secondary | ICD-10-CM | POA: Diagnosis not present

## 2015-08-15 DIAGNOSIS — M79605 Pain in left leg: Secondary | ICD-10-CM | POA: Diagnosis present

## 2015-08-15 DIAGNOSIS — Z792 Long term (current) use of antibiotics: Secondary | ICD-10-CM

## 2015-08-15 DIAGNOSIS — F329 Major depressive disorder, single episode, unspecified: Secondary | ICD-10-CM | POA: Diagnosis not present

## 2015-08-15 DIAGNOSIS — Z86718 Personal history of other venous thrombosis and embolism: Secondary | ICD-10-CM | POA: Diagnosis not present

## 2015-08-15 DIAGNOSIS — S82209A Unspecified fracture of shaft of unspecified tibia, initial encounter for closed fracture: Secondary | ICD-10-CM | POA: Diagnosis present

## 2015-08-15 DIAGNOSIS — R29898 Other symptoms and signs involving the musculoskeletal system: Secondary | ICD-10-CM | POA: Diagnosis not present

## 2015-08-15 DIAGNOSIS — S82202A Unspecified fracture of shaft of left tibia, initial encounter for closed fracture: Secondary | ICD-10-CM

## 2015-08-15 DIAGNOSIS — Z7901 Long term (current) use of anticoagulants: Secondary | ICD-10-CM

## 2015-08-15 DIAGNOSIS — R609 Edema, unspecified: Secondary | ICD-10-CM | POA: Diagnosis not present

## 2015-08-15 DIAGNOSIS — Z89421 Acquired absence of other right toe(s): Secondary | ICD-10-CM | POA: Diagnosis not present

## 2015-08-15 DIAGNOSIS — M869 Osteomyelitis, unspecified: Secondary | ICD-10-CM | POA: Diagnosis present

## 2015-08-15 DIAGNOSIS — M86671 Other chronic osteomyelitis, right ankle and foot: Secondary | ICD-10-CM | POA: Diagnosis not present

## 2015-08-15 DIAGNOSIS — R269 Unspecified abnormalities of gait and mobility: Secondary | ICD-10-CM | POA: Diagnosis not present

## 2015-08-15 DIAGNOSIS — S82201A Unspecified fracture of shaft of right tibia, initial encounter for closed fracture: Secondary | ICD-10-CM

## 2015-08-15 HISTORY — DX: Unspecified atrial fibrillation: I48.91

## 2015-08-15 HISTORY — DX: Bilateral primary osteoarthritis of knee: M17.0

## 2015-08-15 HISTORY — DX: Osteomyelitis, unspecified: M86.9

## 2015-08-15 HISTORY — DX: Migraine, unspecified, not intractable, without status migrainosus: G43.909

## 2015-08-15 HISTORY — DX: Depression, unspecified: F32.A

## 2015-08-15 HISTORY — DX: Unspecified chronic bronchitis: J42

## 2015-08-15 HISTORY — DX: Major depressive disorder, single episode, unspecified: F32.9

## 2015-08-15 LAB — BASIC METABOLIC PANEL
ANION GAP: 11 (ref 5–15)
BUN: 14 mg/dL (ref 6–20)
CALCIUM: 9.5 mg/dL (ref 8.9–10.3)
CHLORIDE: 100 mmol/L — AB (ref 101–111)
CO2: 30 mmol/L (ref 22–32)
Creatinine, Ser: 1.03 mg/dL (ref 0.61–1.24)
Glucose, Bld: 103 mg/dL — ABNORMAL HIGH (ref 65–99)
POTASSIUM: 5 mmol/L (ref 3.5–5.1)
SODIUM: 141 mmol/L (ref 135–145)

## 2015-08-15 LAB — SAMPLE TO BLOOD BANK

## 2015-08-15 LAB — CBC
HEMATOCRIT: 40.7 % (ref 39.0–52.0)
HEMOGLOBIN: 12.3 g/dL — AB (ref 13.0–17.0)
MCH: 26.7 pg (ref 26.0–34.0)
MCHC: 30.2 g/dL (ref 30.0–36.0)
MCV: 88.3 fL (ref 78.0–100.0)
Platelets: 177 10*3/uL (ref 150–400)
RBC: 4.61 MIL/uL (ref 4.22–5.81)
RDW: 17.1 % — ABNORMAL HIGH (ref 11.5–15.5)
WBC: 7.8 10*3/uL (ref 4.0–10.5)

## 2015-08-15 MED ORDER — ALBUTEROL SULFATE HFA 108 (90 BASE) MCG/ACT IN AERS: 1.0000 | INHALATION_SPRAY | Freq: Four times a day (QID) | RESPIRATORY_TRACT | Status: DC | PRN

## 2015-08-15 MED ORDER — HYDROMORPHONE HCL 1 MG/ML IJ SOLN
1.0000 mg | INTRAMUSCULAR | Status: DC | PRN
  Administered 2015-08-15: 2 mg via INTRAVENOUS
  Administered 2015-08-15: 1 mg via INTRAVENOUS
  Administered 2015-08-15 – 2015-08-16 (×4): 2 mg via INTRAVENOUS
  Administered 2015-08-16: 1 mg via INTRAVENOUS
  Administered 2015-08-16 (×2): 2 mg via INTRAVENOUS
  Administered 2015-08-16: 1 mg via INTRAVENOUS
  Administered 2015-08-16: 2 mg via INTRAVENOUS
  Administered 2015-08-17: 1 mg via INTRAVENOUS
  Administered 2015-08-17 (×5): 2 mg via INTRAVENOUS
  Administered 2015-08-17: 1 mg via INTRAVENOUS
  Administered 2015-08-18 (×3): 2 mg via INTRAVENOUS
  Administered 2015-08-18: 1 mg via INTRAVENOUS
  Administered 2015-08-18: 2 mg via INTRAVENOUS
  Administered 2015-08-18 – 2015-08-21 (×8): 1 mg via INTRAVENOUS
  Filled 2015-08-15 (×2): qty 1
  Filled 2015-08-15: qty 2
  Filled 2015-08-15: qty 1
  Filled 2015-08-15: qty 2
  Filled 2015-08-15 (×2): qty 1
  Filled 2015-08-15: qty 2
  Filled 2015-08-15: qty 1
  Filled 2015-08-15 (×2): qty 2
  Filled 2015-08-15: qty 1
  Filled 2015-08-15: qty 2
  Filled 2015-08-15 (×2): qty 1
  Filled 2015-08-15 (×2): qty 2
  Filled 2015-08-15 (×2): qty 1
  Filled 2015-08-15: qty 2
  Filled 2015-08-15 (×3): qty 1
  Filled 2015-08-15 (×5): qty 2
  Filled 2015-08-15: qty 1
  Filled 2015-08-15 (×3): qty 2

## 2015-08-15 MED ORDER — DIPHENHYDRAMINE HCL 50 MG/ML IJ SOLN: 25.0000 mg | Freq: Four times a day (QID) | INTRAMUSCULAR | Status: DC | PRN

## 2015-08-15 MED ORDER — VANCOMYCIN HCL 10 G IV SOLR
1250.0000 mg | Freq: Two times a day (BID) | INTRAVENOUS | Status: DC
  Administered 2015-08-15 – 2015-08-16 (×3): 1250 mg via INTRAVENOUS
  Filled 2015-08-15 (×5): qty 1250

## 2015-08-15 MED ORDER — FENTANYL CITRATE (PF) 100 MCG/2ML IJ SOLN
100.0000 ug | Freq: Once | INTRAMUSCULAR | Status: AC
Start: 2015-08-15 — End: 2015-08-15
  Administered 2015-08-15: 100 ug via INTRAVENOUS
  Filled 2015-08-15: qty 2

## 2015-08-15 MED ORDER — ADULT MULTIVITAMIN W/MINERALS CH
1.0000 | ORAL_TABLET | Freq: Every day | ORAL | Status: DC
  Administered 2015-08-15 – 2015-08-21 (×6): 1 via ORAL
  Filled 2015-08-15 (×6): qty 1

## 2015-08-15 MED ORDER — MORPHINE SULFATE ER 60 MG PO TBCR
90.0000 mg | EXTENDED_RELEASE_TABLET | Freq: Two times a day (BID) | ORAL | Status: DC
  Administered 2015-08-15 – 2015-08-21 (×12): 90 mg via ORAL
  Filled 2015-08-15 (×10): qty 1
  Filled 2015-08-15: qty 3
  Filled 2015-08-15 (×2): qty 1

## 2015-08-15 MED ORDER — VENLAFAXINE HCL ER 75 MG PO CP24
75.0000 mg | ORAL_CAPSULE | Freq: Every day | ORAL | Status: DC
  Administered 2015-08-16 – 2015-08-21 (×5): 75 mg via ORAL
  Filled 2015-08-15 (×5): qty 1

## 2015-08-15 MED ORDER — FENTANYL CITRATE (PF) 100 MCG/2ML IJ SOLN
50.0000 ug | Freq: Once | INTRAMUSCULAR | Status: AC
  Administered 2015-08-15: 50 ug via INTRAVENOUS
  Filled 2015-08-15: qty 2

## 2015-08-15 MED ORDER — OXYCODONE HCL 5 MG PO TABS
30.0000 mg | ORAL_TABLET | Freq: Four times a day (QID) | ORAL | Status: DC
  Administered 2015-08-15 – 2015-08-21 (×24): 30 mg via ORAL
  Filled 2015-08-15 (×24): qty 6

## 2015-08-15 MED ORDER — ONDANSETRON HCL 4 MG/2ML IJ SOLN: 4.0000 mg | Freq: Four times a day (QID) | INTRAMUSCULAR | Status: DC | PRN

## 2015-08-15 MED ORDER — POLYETHYLENE GLYCOL 3350 17 G PO PACK
17.0000 g | PACK | Freq: Every day | ORAL | Status: DC
  Administered 2015-08-16 – 2015-08-21 (×4): 17 g via ORAL
  Filled 2015-08-15 (×5): qty 1

## 2015-08-15 MED ORDER — HYDROMORPHONE HCL 1 MG/ML IJ SOLN
1.0000 mg | Freq: Once | INTRAMUSCULAR | Status: AC
  Administered 2015-08-15: 1 mg via INTRAVENOUS
  Filled 2015-08-15: qty 1

## 2015-08-15 MED ORDER — DOCUSATE SODIUM 100 MG PO CAPS
100.0000 mg | ORAL_CAPSULE | Freq: Two times a day (BID) | ORAL | Status: DC
  Administered 2015-08-15 – 2015-08-21 (×11): 100 mg via ORAL
  Filled 2015-08-15 (×12): qty 1

## 2015-08-15 MED ORDER — FLECAINIDE ACETATE 100 MG PO TABS
100.0000 mg | ORAL_TABLET | Freq: Two times a day (BID) | ORAL | Status: DC
  Administered 2015-08-15 – 2015-08-21 (×11): 100 mg via ORAL
  Filled 2015-08-15 (×15): qty 1

## 2015-08-15 MED ORDER — ENOXAPARIN SODIUM 100 MG/ML ~~LOC~~ SOLN
100.0000 mg | Freq: Two times a day (BID) | SUBCUTANEOUS | Status: DC
  Administered 2015-08-15: 100 mg via SUBCUTANEOUS
  Filled 2015-08-15 (×5): qty 1

## 2015-08-15 MED ORDER — SODIUM CHLORIDE 0.9 % IV SOLN
INTRAVENOUS | Status: DC
  Administered 2015-08-15: 15:00:00 via INTRAVENOUS

## 2015-08-15 MED ORDER — ATORVASTATIN CALCIUM 80 MG PO TABS
80.0000 mg | ORAL_TABLET | Freq: Every day | ORAL | Status: DC
  Administered 2015-08-15 – 2015-08-21 (×6): 80 mg via ORAL
  Filled 2015-08-15 (×7): qty 1

## 2015-08-15 MED ORDER — ALBUTEROL SULFATE (2.5 MG/3ML) 0.083% IN NEBU: 2.5000 mg | INHALATION_SOLUTION | Freq: Four times a day (QID) | RESPIRATORY_TRACT | Status: DC | PRN

## 2015-08-15 MED ORDER — ONDANSETRON HCL 4 MG/2ML IJ SOLN
4.0000 mg | Freq: Once | INTRAMUSCULAR | Status: AC
  Administered 2015-08-15: 4 mg via INTRAVENOUS
  Filled 2015-08-15: qty 2

## 2015-08-15 MED ORDER — METHOCARBAMOL 500 MG PO TABS
500.0000 mg | ORAL_TABLET | Freq: Four times a day (QID) | ORAL | Status: DC | PRN
  Administered 2015-08-17 – 2015-08-19 (×2): 500 mg via ORAL
  Filled 2015-08-15 (×3): qty 1

## 2015-08-15 MED ORDER — GABAPENTIN 100 MG PO CAPS
100.0000 mg | ORAL_CAPSULE | Freq: Three times a day (TID) | ORAL | Status: DC
  Administered 2015-08-15 – 2015-08-21 (×17): 100 mg via ORAL
  Filled 2015-08-15 (×17): qty 1

## 2015-08-15 MED ORDER — METOPROLOL TARTRATE 25 MG PO TABS
75.0000 mg | ORAL_TABLET | Freq: Two times a day (BID) | ORAL | Status: DC
  Administered 2015-08-15 – 2015-08-20 (×10): 75 mg via ORAL
  Filled 2015-08-15 (×13): qty 3

## 2015-08-15 MED ORDER — FENTANYL CITRATE (PF) 100 MCG/2ML IJ SOLN
100.0000 ug | Freq: Once | INTRAMUSCULAR | Status: AC
  Administered 2015-08-15: 100 ug via INTRAVENOUS
  Filled 2015-08-15: qty 2

## 2015-08-15 MED ORDER — DULOXETINE HCL 60 MG PO CPEP
60.0000 mg | ORAL_CAPSULE | Freq: Every day | ORAL | Status: DC
Start: 2015-08-15 — End: 2015-08-21
  Administered 2015-08-15 – 2015-08-19 (×5): 60 mg via ORAL
  Filled 2015-08-15 (×6): qty 1

## 2015-08-15 MED ORDER — TEMAZEPAM 15 MG PO CAPS
30.0000 mg | ORAL_CAPSULE | Freq: Every day | ORAL | Status: DC
  Administered 2015-08-15 – 2015-08-20 (×6): 30 mg via ORAL
  Filled 2015-08-15 (×6): qty 2

## 2015-08-15 NOTE — Progress Notes (Signed)
Orthopedic Tech Progress Note Patient Details:  [name] 08/17/1955 [id number]  Ortho Devices Type of Ortho Device: Ace wrap, Long leg splint, Stirrup splint Ortho Device/Splint Interventions: Application   Saul Fordyce 08/15/2015, 2:26 PM

## 2015-08-15 NOTE — ED Provider Notes (Signed)
CSN: [id number]     Arrival date & time 08/15/15  0846 History   First MD Initiated Contact with Patient 08/15/15 1000     Chief Complaint  Patient presents with  . Leg Pain  . Fall   (Consider location/radiation/quality/duration/timing/severity/associated sxs/prior Treatment) HPI 60 y.o. male presents to the Emergency Department today complaining of L leg pain after a fall sustained at 7:30AM today. States that he lost his balance and collapsed onto his left leg with his buttocks on the stairs. No head trauma/LOC. 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 Seen and treated surgically. Given PICC line and sent home with IV Vanc on 08/09/15. 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 Currently no N/V/D, Afebrile. No SOB/ CP/ Abd pain.   Past Medical History  Diagnosis Date  . Hypertension   . Chronic knee pain   . Hyperlipemia   . PUD (peptic ulcer disease)   . Dysrhythmia   . Peripheral vascular disease (HCC)   . Anxiety   . GERD (gastroesophageal reflux disease)   . 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 Wound of right ankle 06/05/2015   Past Surgical History  Procedure Laterality Date  . I&d extremity Left 07/12/2013    Procedure: I&D Left Thigh Abscess;  Surgeon: [name], MD;  Location: [facility] OR;  Service: Orthopedics;  Laterality: Left;  . Replacement total knee bilateral    . Tonsillectomy    . Hernia repair    . Cholecystectomy    . Appendectomy    . Shoulder surgery    . Elbow surgery    . 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 Incision and drainage abscess Right 04/11/2015    Procedure: INCISION AND DRAINAGE OF RIGHT THIGH ABSCESS;  Surgeon: Fransisco Hertz, MD;  Location: [facility] OR;  Service: Vascular;   Laterality: Right;  . Incision and drainage abscess Right 04/13/2015    Procedure: INCISION AND DRAINAGE THIGH ABSCESS;  Surgeon: Fransisco Hertz, MD;  Location: [facility] OR;  Service: Vascular;  Laterality: Right;  . I&d extremity Left 04/17/2015    Procedure: IRRIGATION AND DEBRIDEMENT LEFT EXTREMITY;  Surgeon: Fransisco Hertz, MD;  Location: [facility] OR;  Service: Vascular;  Laterality: Left;  Marland Kitchen Muscle flap closure Right 04/19/2015    Procedure: RIGHT RECTUS ABDOMINUS  FLAP TO RIGHT GROIN;  Surgeon: Glenna Fellows, MD;  Location: [facility] OR;  Service: Plastics;  Laterality: Right;  . Femoral revision Right 04/19/2015    Procedure: OVERSEW RIGHT FEMORAL VEIN ;  Surgeon: Chuck Hint, MD;  Location: [facility] OR;  Service: Vascular;  Laterality: Right;  . Wound exploration Right 04/23/2015    Procedure: EXAM UNDER ANESTHESIA AND VAC CHANGE RIGHT THIGH;  Surgeon: Glenna Fellows, MD;  Location: [facility] OR;  Service: Plastics;  Laterality: Right;  . 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 I&d extremity Left 07/09/2015    Procedure: IRRIGATION AND DEBRIDEMENT LEFT WRIST;  Surgeon: Knute Neu, MD;  Location: [facility] OR;  Service: Plastics;  Laterality: Left;  . I&d extremity Left 08/08/2015    Procedure: IRRIGATION AND DEBRIDEMENT EXTREMITY;  Surgeon: Knute Neu,  MD;  Location: [facility] OR;  Service: Plastics;  Laterality: Left;   Family History  Problem Relation Age of Onset  . Heart attack Paternal Grandmother 67  . Heart disease Mother 32    arrythmia  . Stroke Paternal Grandfather   . Stroke Maternal Grandfather    Social History  Substance Use Topics  . Smoking status: Former Smoker    Types: Cigarettes  . Smokeless tobacco: Never Used  . Alcohol Use: No    Review of Systems 10 Systems reviewed and all are negative for acute change except as noted in the HPI.  Allergies  Verapamil  and Imitrex  Home Medications   Prior to Admission medications   Medication Sig Start Date End Date Taking? Authorizing Provider  albuterol (PROAIR HFA) 108 (90 BASE) MCG/ACT inhaler Inhale 1 puff into the lungs every 6 (six) hours as needed for wheezing or shortness of breath.  09/06/13   Historical Provider, MD  apixaban (ELIQUIS) 5 MG TABS tablet Take 1 tablet (5 mg total) by mouth 2 (two) times daily. 08/09/15   Shanker Levora Dredge, MD  atorvastatin (LIPITOR) 80 MG tablet Take 1 tablet (80 mg total) by mouth daily. 05/24/15   Mcarthur Rossetti Angiulli, PA-C  Diclofenac-Misoprostol 75-0.2 MG TBEC  06/18/15   Historical Provider, MD  docusate sodium (COLACE) 100 MG capsule Take 1 capsule (100 mg total) by mouth 2 (two) times daily. 05/24/15   Mcarthur Rossetti Angiulli, PA-C  DULoxetine (CYMBALTA) 30 MG capsule Take 60 mg by mouth daily at 12 noon. 06/25/15   Historical Provider, MD  famciclovir (FAMVIR) 500 MG tablet Take 500 mg by mouth 2 (two) times daily as needed (fever blisters).  10/25/13   Historical Provider, MD  flecainide (TAMBOCOR) 100 MG tablet Take 1 tablet (100 mg total) by mouth every 12 (twelve) hours. 05/24/15   Mcarthur Rossetti Angiulli, PA-C  gabapentin (NEURONTIN) 100 MG capsule Take 1 capsule (100 mg total) by mouth 3 (three) times daily. 05/24/15   Mcarthur Rossetti Angiulli, PA-C  Metoprolol Tartrate 75 MG TABS Take 75 mg by mouth 2 (two) times daily. 05/24/15   Mcarthur Rossetti Angiulli, PA-C  morphine (MS CONTIN) 30 MG 12 hr tablet Take 3 tablets (90 mg total) by mouth every 12 (twelve) hours. 08/09/15   Shanker Levora Dredge, MD  Multiple Vitamin (MULTIVITAMIN WITH MINERALS) TABS tablet Take 1 tablet by mouth daily. 05/24/15   Mcarthur Rossetti Angiulli, PA-C  naloxegol oxalate (MOVANTIK) 25 MG TABS tablet Take 1 tablet (25 mg total) by mouth daily before breakfast. 05/24/15   Mcarthur Rossetti Angiulli, PA-C  oxycodone (ROXICODONE) 30 MG immediate release tablet Take 1 tablet (30 mg total) by mouth every 6 (six) hours. scheduled 08/09/15    Maretta Bees, MD  polyethylene glycol (MIRALAX / GLYCOLAX) packet Take 17 g by mouth daily. 08/09/15   Shanker Levora Dredge, MD  SUMAtriptan (IMITREX) 50 MG tablet Take 50 mg by mouth daily as needed for migraine or headache. Maximum 2 doses in 2 days 02/15/15   Historical Provider, MD  temazepam (RESTORIL) 15 MG capsule Take 15-30 mg by mouth at bedtime. 06/25/15   Historical Provider, MD  testosterone cypionate (DEPOTESTOSTERONE CYPIONATE) 200 MG/ML injection Inject 100 mg into the muscle every 14 (fourteen) days. Reported on 08/05/2015 06/25/15   Historical Provider, MD  trifluridine (VIROPTIC) 1 % ophthalmic solution Place 1 drop into both eyes 2 (two) times daily as needed (breakouts (blurry, matted eyelids)).    Historical Provider, MD  vancomycin 1,250  mg in sodium chloride 0.9 % 250 mL Inject 1,250 mg into the vein every 12 (twelve) hours. 08/09/15   Shanker Levora Dredge, MD  venlafaxine XR (EFFEXOR-XR) 75 MG 24 hr capsule Take 1 capsule (75 mg total) by mouth daily with breakfast. 05/24/15   Mcarthur Rossetti Angiulli, PA-C   BP 108/88 mmHg  Pulse 66  Resp 13  SpO2 99%   Physical Exam  Constitutional: He is oriented to person, place, and time. He appears well-developed and well-nourished.  HENT:  Head: Normocephalic and atraumatic.  Eyes: EOM are normal.  Neck: Normal range of motion. Neck supple.  Cardiovascular: Normal rate, regular rhythm and normal heart sounds.   Pulmonary/Chest: Effort normal and breath sounds normal.  Abdominal: Soft.  Musculoskeletal:       Left knee: Normal. He exhibits normal range of motion, no swelling and no deformity.       Left ankle: He exhibits decreased range of motion and ecchymosis. He exhibits no laceration and normal pulse.  No swelling of L tib compartment. Pulses palpable on Doralis pedis. Sensation intact. Motion intact.  Neurological: He is alert and oriented to person, place, and time.  Skin: Skin is warm and dry.  Psychiatric: He has a normal mood  and affect. His behavior is normal. Thought content normal.  Nursing note and vitals reviewed.  ED Course  Procedures (including critical care time) Labs Review Labs Reviewed  CBC - Abnormal; Notable for the following:    Hemoglobin 12.3 (*)    RDW 17.1 (*)    All other components within normal limits  BASIC METABOLIC PANEL - Abnormal; Notable for the following:    Chloride 100 (*)    Glucose, Bld 103 (*)    All other components within normal limits  SAMPLE TO BLOOD BANK   Imaging Review Dg Tibia/fibula Left  08/15/2015  CLINICAL DATA:  Pain following fall EXAM: LEFT TIBIA AND FIBULA - 2 VIEW COMPARISON:  None. FINDINGS: Frontal and lateral views were obtained. There is a comminuted fracture of the distal tibia with medial and posterior displacement of major fracture fragments with respect to the major proximal fragment. No other fractures. No dislocation apparent. There is a total knee replacement with the prosthetic components in this region appearing well-seated. IMPRESSION: Comminuted fracture distal tibia with displaced fracture fragments. No dislocations. Status post total knee replacement. Electronically Signed   By: Bretta Bang III M.D.   On: 08/15/2015 09:58   Dg Ankle Complete Left  08/15/2015  CLINICAL DATA:  Left leg pain, fell down steps this morning, deformity in left lower leg EXAM: LEFT ANKLE COMPLETE - 3+ VIEW COMPARISON:  04/01/2015 left knee FINDINGS: Three views of the left ankle submitted. No ankle dislocation. There is comminuted displaced oblique fracture in distal shaft of left of tibia. IMPRESSION: Comminuted displaced fracture distal shaft of left tibia. Electronically Signed   By: Natasha Mead M.D.   On: 08/15/2015 10:01   I have personally reviewed and evaluated these images and lab results as part of my medical decision-making.   EKG Interpretation   Date/Time:  Thursday August 15 2015 10:38:05 EST Ventricular Rate:  66 PR Interval:  152 QRS Duration:  111 QT Interval:  394 QTC Calculation: 413 R Axis:   -35 Text Interpretation:  Sinus rhythm Left axis deviation Low voltage,  precordial leads Borderline ST elevation, lateral leads Since last tracing  rate slower Confirmed by KNAPP  MD-J, JON ([id number]) on 08/15/2015 12:36:10 PM  MDM  I have reviewed relevant laboratory values.I have reviewed relevant imaging studies.I personally interpreted the relevant EKG.I have reviewed the relevant previous healthcare records.I obtained HPI from historian. Patient discussed with supervising physician  ED Course:  Assessment:  79y M presents with L distal comminuted closed tibial fracture w/ displaced fragments after a fall sustained earlier today at 7:30AM. Slight Lateral rotation noted on exam. Distal pulses intact. Sensation intact. Motor intact. Compartment not tense w/ no obvious swelling. Pt in NAD. Afebrile. Has hx of Osteomyelitis of L Ulna being and recently D/Ced on 08/09/15 with IV Vanc w/ PICC line in R arm. Currently on Eliquis for DVT sustained in October 2016. Consult with Dr. Magnus Ivan. Splinted in ED by Dr. Magnus Ivan with Posterior Long Leg. Will be admitted for further treatment.   Disposition/Plan:  Admit Pt acknowledges and agrees with plan   Supervising Physician Linwood Dibbles, MD   Final diagnoses:  Closed fracture of right tibia, initial encounter       Audry Pili, PA-C 08/15/15 1334  Linwood Dibbles, MD 08/15/15 [phone]

## 2015-08-15 NOTE — Progress Notes (Signed)
Pt admitted from ED after receiving rept from [facility] ([place] Pt stable. Pt accompanied by wife. Assessment performed.

## 2015-08-15 NOTE — H&P (Signed)
[name] is an 60 y.o. male.   Chief Complaint:   Left tibia shaft fracture HPI:   60 yo male who injured his left leg after an accidental mechanical fall down some stairs when he tripped. Was brought to the ED at [facility] and found to have a closed left tibia fracture.  He does reports left leg pain, but denies numbness and tingling.  He denies other injuries.  Past Medical History  Diagnosis Date  . Hypertension   . Chronic knee pain   . Hyperlipemia   . PUD (peptic ulcer disease)   . Dysrhythmia   . Peripheral vascular disease ([employer])   . Anxiety   . GERD (gastroesophageal reflux disease)   . 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 Foot osteomyelitis, right ([place]) 06/05/2015  . Wound of right ankle 06/05/2015    Past Surgical History  Procedure Laterality Date  . I&d extremity Left 07/12/2013    Procedure: I&D Left Thigh Abscess;  Surgeon: Wylene Simmer, MD;  Location: [place];  Service: Orthopedics;  Laterality: Left;  . Replacement total knee bilateral    . Tonsillectomy    . Hernia repair    . Cholecystectomy    . Appendectomy    . Shoulder surgery    . Elbow surgery    . 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 Incision and drainage abscess Right 04/11/2015    Procedure: INCISION AND DRAINAGE OF RIGHT THIGH ABSCESS;  Surgeon: Conrad Baring, MD;  Location: [place];  Service: Vascular;  Laterality: Right;  . Incision and drainage abscess Right 04/13/2015    Procedure: INCISION AND DRAINAGE THIGH ABSCESS;  Surgeon: Conrad [place], MD;  Location: [place];  Service: Vascular;  Laterality: Right;  . I&d extremity Left 04/17/2015    Procedure: IRRIGATION AND DEBRIDEMENT LEFT EXTREMITY;  Surgeon: Conrad , MD;  Location: [place];  Service: Vascular;  Laterality: Left;  Marland Kitchen Muscle flap closure Right 04/19/2015    Procedure: RIGHT RECTUS  ABDOMINUS  FLAP TO RIGHT GROIN;  Surgeon: Irene Limbo, MD;  Location: [place];  Service: Plastics;  Laterality: Right;  . Femoral revision Right 04/19/2015    Procedure: OVERSEW RIGHT FEMORAL VEIN ;  Surgeon: Angelia Mould, MD;  Location: [place];  Service: Vascular;  Laterality: Right;  . Wound exploration Right 04/23/2015    Procedure: EXAM UNDER ANESTHESIA AND VAC CHANGE RIGHT THIGH;  Surgeon: Irene Limbo, MD;  Location: [place];  Service: Plastics;  Laterality: Right;  . Amputation Right 05/17/2015    Procedure: Right 1st and 2nd Toe Amputation;  Surgeon: Newt Minion, MD;  Location: [place];  Service: Orthopedics;  Laterality: Right;  . I&d extremity Left 07/09/2015    Procedure: IRRIGATION AND DEBRIDEMENT LEFT WRIST;  Surgeon: Dayna Barker, MD;  Location: [place];  Service: Plastics;  Laterality: Left;  . I&d extremity Left 08/08/2015    Procedure: IRRIGATION AND DEBRIDEMENT EXTREMITY;  Surgeon: Dayna Barker, MD;  Location: [place];  Service: Plastics;  Laterality: Left;    Family History  Problem Relation Age of Onset  . Heart attack Paternal Grandmother 68  . Heart disease Mother 72    arrythmia  . Stroke Paternal Grandfather   . Stroke Maternal Grandfather    Social History:  reports that he has quit smoking. His smoking use  included Cigarettes. He has never used smokeless tobacco. He reports that he does not drink alcohol or use illicit drugs.  Allergies:  Allergies  Allergen Reactions  . Verapamil Other (See Comments)    Causes pvc's  . Imitrex [Sumatriptan] Other (See Comments)    Chest pain, 2004 (tolerates 50 mg prn)     (Not in a hospital admission)  Results for orders placed or performed during the hospital encounter of 08/15/15 (from the past 48 hour(s))  CBC     Status: Abnormal   Collection Time: 08/15/15 10:55 AM  Result Value Ref Range   WBC 7.8 4.0 - 10.5 K/uL   RBC 4.61 4.22 - 5.81 MIL/uL   Hemoglobin 12.3 (L) 13.0 - 17.0 g/dL   HCT 40.7 39.0 -  52.0 %   MCV 88.3 78.0 - 100.0 fL   MCH 26.7 26.0 - 34.0 pg   MCHC 30.2 30.0 - 36.0 g/dL   RDW 17.1 (H) 11.5 - 15.5 %   Platelets 177 150 - 400 K/uL  Basic metabolic panel     Status: Abnormal   Collection Time: 08/15/15 10:55 AM  Result Value Ref Range   Sodium 141 135 - 145 mmol/L   Potassium 5.0 3.5 - 5.1 mmol/L   Chloride 100 (L) 101 - 111 mmol/L   CO2 30 22 - 32 mmol/L   Glucose, Bld 103 (H) 65 - 99 mg/dL   BUN 14 6 - 20 mg/dL   Creatinine, Ser 1.03 0.61 - 1.24 mg/dL   Calcium 9.5 8.9 - 10.3 mg/dL   GFR calc non Af Amer >60 >60 mL/min   GFR calc Af Amer >60 >60 mL/min    Comment: (NOTE) The eGFR has been calculated using the CKD EPI equation. This calculation has not been validated in all clinical situations. eGFR's persistently <60 mL/min signify possible Chronic Kidney Disease.    Anion gap 11 5 - 15  Sample to Blood Bank     Status: None   Collection Time: 08/15/15 10:55 AM  Result Value Ref Range   Blood Bank Specimen SAMPLE AVAILABLE FOR TESTING    Sample Expiration 08/16/2015    Dg Tibia/fibula Left  08/15/2015  CLINICAL DATA:  Pain following fall EXAM: LEFT TIBIA AND FIBULA - 2 VIEW COMPARISON:  None. FINDINGS: Frontal and lateral views were obtained. There is a comminuted fracture of the distal tibia with medial and posterior displacement of major fracture fragments with respect to the major proximal fragment. No other fractures. No dislocation apparent. There is a total knee replacement with the prosthetic components in this region appearing well-seated. IMPRESSION: Comminuted fracture distal tibia with displaced fracture fragments. No dislocations. Status post total knee replacement. Electronically Signed   By: Lowella Grip III M.D.   On: 08/15/2015 09:58   Dg Ankle Complete Left  08/15/2015  CLINICAL DATA:  Left leg pain, fell down steps this morning, deformity in left lower leg EXAM: LEFT ANKLE COMPLETE - 3+ VIEW COMPARISON:  04/01/2015 left knee FINDINGS:  Three views of the left ankle submitted. No ankle dislocation. There is comminuted displaced oblique fracture in distal shaft of left of tibia. IMPRESSION: Comminuted displaced fracture distal shaft of left tibia. Electronically Signed   By: Lahoma Crocker M.D.   On: 08/15/2015 10:01    ROS  Blood pressure 108/88, pulse 66, resp. rate 13, SpO2 99 %. Physical Exam  Constitutional: He is oriented to person, place, and time. He appears well-developed and well-nourished.  HENT:  Head: Normocephalic  and atraumatic.  Eyes: EOM are normal. Pupils are equal, round, and reactive to light.  Neck: Normal range of motion. Neck supple.  Cardiovascular: Normal rate and regular rhythm.   Respiratory: Effort normal and breath sounds normal.  GI: Soft. Bowel sounds are normal.  Musculoskeletal:       Left lower leg: He exhibits tenderness, bony tenderness, swelling, edema and deformity.  Neurological: He is alert and oriented to person, place, and time.  Skin: Skin is warm and dry.  Psychiatric: He has a normal mood and affect.    His left leg compartments are soft and he has a palpable pulse in his left foot. He has normal sensation in his left foot   Assessment/Plan Left complex tibia shaft fracture 1)  He will need to be placed in a well-padded splint today with ice, elevation, and non-weight bearing.  Unfortunately, he will need surgery to stabilize his fracture with plates and screws due to a previous left knee replacement (no ability to treat with IM nail).  He is on Eliquis, so surgery will need to be delayed for a few days.  Will be admitted for pain control and therapy.  Surgery not for a few days.  Will start Lovenox givn his history of DVT in right leg just this past September.  He is on IV antibiotics due to osteomyelitis in his left wrist.  He is also on chronic pain meds.  He understands that is infection risk is quite high due to his present condition.  Mcarthur Rossetti 08/15/2015, 12:50  PM

## 2015-08-15 NOTE — Progress Notes (Signed)
PHARMACY CONSULT NOTE - Initial Consult  Pharmacy Consult for Lovenox / Vancomycin   Indication: VTE treatment / Osteomyelitis   Allergies  Allergen Reactions  . Verapamil Other (See Comments)    Causes pvc's  . Imitrex [Sumatriptan] Other (See Comments)    Chest pain, 2004 (tolerates 50 mg prn)    Patient Measurements:  Total Body Weight: 99.8 Ideal Body Weight: 84.5  Vital Signs: Temp Source: Oral (01/19 0857) BP: 119/78 mmHg (01/19 1430) Pulse Rate: 72 (01/19 1345)  Labs:  Recent Labs  08/15/15 1055  HGB 12.3*  HCT 40.7  PLT 177  CREATININE 1.03    Estimated Creatinine Clearance: 94.6 mL/min (by C-G formula based on Cr of 1.03).   Medical History: Past Medical History  Diagnosis Date  . Hypertension   . Chronic knee pain   . Hyperlipemia   . PUD (peptic ulcer disease)   . Dysrhythmia   . Peripheral vascular disease ([facility])   . Anxiety   . GERD (gastroesophageal reflux disease)   . 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 Wound of right ankle 06/05/2015    Medications:   (Not in a hospital admission) Scheduled:  . atorvastatin  80 mg Oral Daily  . docusate sodium  100 mg Oral BID  . DULoxetine  60 mg Oral Q1200  . flecainide  100 mg Oral Q12H  . gabapentin  100 mg Oral TID  . Metoprolol Tartrate  75 mg Oral BID  . morphine  90 mg Oral Q12H  . multivitamin with minerals  1 tablet Oral Daily  . oxycodone  30 mg Oral Q6H  . polyethylene glycol  17 g Oral Daily  . temazepam  30 mg Oral QHS  . [START ON 08/16/2015] venlafaxine XR  75 mg Oral Q breakfast   Infusions:  . sodium chloride    . 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 Presents w/ L leg pain after fall down stairs at 7:30AM. Pharmacy consulted to dose Lovenox for VTE treatment. Hgb 12.3, Plts 177, sCr 1.03,  CrCl 94.6, No s/sx of bleeding.  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 Presents w/ L leg pain after fall down stairs at 7:30AM. Pharmacy consulted to dose Lovenox for VTE treatment. WBC 7.8, Hgb 12.3, Plts 177, sCr 1.03, CrCl 94.6, No s/sx of bleeding, no cx results.  Pt currently on Eliquis  BID (last dose 1/19 7:00AM)  Pt currently on PTA Vancomycin  IV q12h  1/19>>Vancomycin>>  Goal of Therapy:  Heparin level 0.6-1 units/ml Monitor platelets by anticoagulation protocol: Yes  Vancomycin trough level 15-20 mcg/ml  Eradication of Infection   Plan:  Lovenox  q12h   Follow up clinical course, renal fxn, s/sx of bleed Continue PTA dose of Vancomycin  IV q12h Measure antibiotic drug levels at steady state Follow up culture results, renal fxn, abx tx, clinical course   [name], PharmD Student  I agree with the assessment and plan. Lovenox 100 mg [place] q12h beginning tonight Vancomycin 1250/12h Check VT at [facility]  [name], PharmD, BCPS Clinical Pharmacist Pager: [phone] 08/15/2015 5:07 PM

## 2015-08-15 NOTE — ED Notes (Signed)
Pt with fall this am and having pain in left leg; leg is discolored with increased cap refill but pulse present; pt sts pain in ankle; pt sts recent hospitalization for sepsis and blood clots

## 2015-08-15 NOTE — ED Notes (Signed)
Ortho, MD at bedside with patient.  

## 2015-08-16 NOTE — Progress Notes (Signed)
Patient ID: [name], male   DOB: [birth year], 60 y.o.   MRN: [id number] Surgery is posted for tomorrow am to address his left tibia shaft fracture.  Will place orders for informed consent, NPO after midnight, and holding Lovenox at this standpoint.

## 2015-08-16 NOTE — Progress Notes (Signed)
[facility]  Patient Status: Active pt with [facility]  [facility] is providing the following services: HHRN and home infusion pharmacy for home IV ABX.  [facility] hospital team will follow [name] while an inpatient and support transition at DC as ordered.  If patient discharges after hours, please call [phone].   Sedalia Muta 08/16/2015, 9:44 AM

## 2015-08-16 NOTE — Progress Notes (Signed)
Patient ID: [name], male   DOB: October 05, 1955, 60 y.o.   MRN: [id number] No acute changes.  Comfortable in left leg slpint.  He understands the need for surgery.  Will likely plan on surgery for tomorrow to fix his broken left tibia.

## 2015-08-16 NOTE — Progress Notes (Signed)
Utilization review completed. Tyshon Fanning, RN, BSN. 

## 2015-08-17 ENCOUNTER — Inpatient Hospital Stay (HOSPITAL_COMMUNITY): Payer: Medicaid Other

## 2015-08-17 ENCOUNTER — Encounter (HOSPITAL_COMMUNITY)
Admission: EM | Disposition: A | Discharge status: Rehabilitation Facility | Payer: Self-pay | Source: Home / Self Care | Attending: Orthopaedic Surgery

## 2015-08-17 ENCOUNTER — Inpatient Hospital Stay (HOSPITAL_COMMUNITY): Payer: Medicaid Other | Admitting: Certified Registered Nurse Anesthetist

## 2015-08-17 HISTORY — PX: ORIF TIBIA FRACTURE: SHX5416

## 2015-08-17 LAB — VANCOMYCIN, TROUGH: Vancomycin Tr: 12 ug/mL (ref 10.0–20.0)

## 2015-08-17 SURGERY — OPEN REDUCTION INTERNAL FIXATION (ORIF) TIBIA FRACTURE
Anesthesia: General | Site: Leg Lower | Laterality: Left

## 2015-08-17 MED ORDER — PROMETHAZINE HCL 25 MG/ML IJ SOLN: 6.2500 mg | INTRAMUSCULAR | Status: DC | PRN

## 2015-08-17 MED ORDER — PROPOFOL 10 MG/ML IV BOLUS
INTRAVENOUS | Status: AC
  Filled 2015-08-17: qty 20

## 2015-08-17 MED ORDER — PROPOFOL 10 MG/ML IV BOLUS
INTRAVENOUS | Status: AC
Start: 2015-08-17 — End: 2015-08-17
  Filled 2015-08-17: qty 20

## 2015-08-17 MED ORDER — FENTANYL CITRATE (PF) 250 MCG/5ML IJ SOLN
INTRAMUSCULAR | Status: AC
  Filled 2015-08-17: qty 5

## 2015-08-17 MED ORDER — 0.9 % SODIUM CHLORIDE (POUR BTL) OPTIME
TOPICAL | Status: DC | PRN
  Administered 2015-08-17: 1000 mL

## 2015-08-17 MED ORDER — LIDOCAINE HCL (CARDIAC) 20 MG/ML IV SOLN
INTRAVENOUS | Status: DC | PRN
  Administered 2015-08-17: 100 mg via INTRATRACHEAL

## 2015-08-17 MED ORDER — VANCOMYCIN HCL 10 G IV SOLR
1500.0000 mg | Freq: Two times a day (BID) | INTRAVENOUS | Status: DC
  Administered 2015-08-17 (×2): 1500 mg via INTRAVENOUS
  Administered 2015-08-17: 1500 g via INTRAVENOUS
  Administered 2015-08-18 – 2015-08-21 (×7): 1500 mg via INTRAVENOUS
  Filled 2015-08-17 (×11): qty 1500

## 2015-08-17 MED ORDER — PROPOFOL 10 MG/ML IV BOLUS
INTRAVENOUS | Status: DC | PRN
  Administered 2015-08-17: 150 mg via INTRAVENOUS

## 2015-08-17 MED ORDER — ONDANSETRON HCL 4 MG/2ML IJ SOLN
INTRAMUSCULAR | Status: DC | PRN
  Administered 2015-08-17: 4 mg via INTRAVENOUS

## 2015-08-17 MED ORDER — PHENYLEPHRINE HCL 10 MG/ML IJ SOLN
INTRAMUSCULAR | Status: DC | PRN
  Administered 2015-08-17: 80 ug via INTRAVENOUS

## 2015-08-17 MED ORDER — MIDAZOLAM HCL 5 MG/5ML IJ SOLN
INTRAMUSCULAR | Status: DC | PRN
  Administered 2015-08-17: 2 mg via INTRAVENOUS

## 2015-08-17 MED ORDER — APIXABAN 5 MG PO TABS
5.0000 mg | ORAL_TABLET | Freq: Two times a day (BID) | ORAL | Status: DC
  Administered 2015-08-17 – 2015-08-21 (×9): 5 mg via ORAL
  Filled 2015-08-17 (×9): qty 1

## 2015-08-17 MED ORDER — ROCURONIUM BROMIDE 100 MG/10ML IV SOLN
INTRAVENOUS | Status: DC | PRN
  Administered 2015-08-17: 50 mg via INTRAVENOUS

## 2015-08-17 MED ORDER — MIDAZOLAM HCL 2 MG/2ML IJ SOLN
INTRAMUSCULAR | Status: AC
  Filled 2015-08-17: qty 2

## 2015-08-17 MED ORDER — HYDROMORPHONE HCL 1 MG/ML IJ SOLN
0.2500 mg | INTRAMUSCULAR | Status: DC | PRN
  Administered 2015-08-17 (×2): 0.5 mg via INTRAVENOUS

## 2015-08-17 MED ORDER — FENTANYL CITRATE (PF) 250 MCG/5ML IJ SOLN
INTRAMUSCULAR | Status: DC | PRN
  Administered 2015-08-17: 100 ug via INTRAVENOUS
  Administered 2015-08-17: 150 ug via INTRAVENOUS

## 2015-08-17 MED ORDER — BUPIVACAINE HCL (PF) 0.25 % IJ SOLN
INTRAMUSCULAR | Status: AC
  Filled 2015-08-17: qty 30

## 2015-08-17 MED ORDER — NEOSTIGMINE METHYLSULFATE 10 MG/10ML IV SOLN
INTRAVENOUS | Status: DC | PRN
  Administered 2015-08-17: 3 mg via INTRAVENOUS

## 2015-08-17 MED ORDER — BUPIVACAINE HCL (PF) 0.25 % IJ SOLN
INTRAMUSCULAR | Status: DC | PRN
  Administered 2015-08-17: 10 mL

## 2015-08-17 MED ORDER — GLYCOPYRROLATE 0.2 MG/ML IJ SOLN
INTRAMUSCULAR | Status: DC | PRN
  Administered 2015-08-17: .4 mg via INTRAVENOUS

## 2015-08-17 MED ORDER — HYDROMORPHONE HCL 1 MG/ML IJ SOLN
INTRAMUSCULAR | Status: AC
  Administered 2015-08-19: 1 mg via INTRAVENOUS
  Filled 2015-08-17: qty 1

## 2015-08-17 MED ORDER — LACTATED RINGERS IV SOLN
INTRAVENOUS | Status: DC
  Administered 2015-08-17: 07:00:00 via INTRAVENOUS

## 2015-08-17 SURGICAL SUPPLY — 81 items
BANDAGE ACE 4X5 VEL STRL LF (GAUZE/BANDAGES/DRESSINGS) ×1 IMPLANT
BANDAGE ACE 6X5 VEL STRL LF (GAUZE/BANDAGES/DRESSINGS) ×1 IMPLANT
BANDAGE ELASTIC 4 VELCRO ST LF (GAUZE/BANDAGES/DRESSINGS) ×2 IMPLANT
BANDAGE ELASTIC 6 VELCRO ST LF (GAUZE/BANDAGES/DRESSINGS) ×2 IMPLANT
BANDAGE ESMARK 6X9 LF (GAUZE/BANDAGES/DRESSINGS) ×1 IMPLANT
BIT DRILL 2.5 X LONG (BIT) ×1
BIT DRILL CALIBR QC 2.8X250 (BIT) ×2 IMPLANT
BIT DRILL LCP QC 2X140 (BIT) ×1 IMPLANT
BIT DRILL QC 3.5X110 (BIT) ×2 IMPLANT
BIT DRILL X LONG 2.5 (BIT) IMPLANT
BNDG CMPR 9X6 STRL LF SNTH (GAUZE/BANDAGES/DRESSINGS) ×1
BNDG COHESIVE 4X5 TAN STRL (GAUZE/BANDAGES/DRESSINGS) ×2 IMPLANT
BNDG ESMARK 6X9 LF (GAUZE/BANDAGES/DRESSINGS) ×2
CLEANER TIP ELECTROSURG 2X2 (MISCELLANEOUS) ×2 IMPLANT
COVER MAYO STAND STRL (DRAPES) ×2 IMPLANT
CUFF TOURNIQUET SINGLE 34IN LL (TOURNIQUET CUFF) ×1 IMPLANT
CUFF TOURNIQUET SINGLE 44IN (TOURNIQUET CUFF) IMPLANT
DRAPE C-ARM 42X72 X-RAY (DRAPES) ×2 IMPLANT
DRAPE C-ARMOR (DRAPES) ×2 IMPLANT
DRAPE ORTHO SPLIT 77X108 STRL (DRAPES) ×4
DRAPE SURG ORHT 6 SPLT 77X108 (DRAPES) ×2 IMPLANT
DRAPE U-SHAPE 47X51 STRL (DRAPES) ×2 IMPLANT
DRILL BIT X LONG 2.5 (BIT) ×2
DRSG PAD ABDOMINAL 8X10 ST (GAUZE/BANDAGES/DRESSINGS) ×2 IMPLANT
DURAPREP 26ML APPLICATOR (WOUND CARE) ×3 IMPLANT
ELECT REM PT RETURN 9FT ADLT (ELECTROSURGICAL) ×2
ELECTRODE REM PT RTRN 9FT ADLT (ELECTROSURGICAL) ×1 IMPLANT
EVACUATOR 1/8 PVC DRAIN (DRAIN) IMPLANT
GAUZE SPONGE 4X4 12PLY STRL (GAUZE/BANDAGES/DRESSINGS) ×2 IMPLANT
GAUZE XEROFORM 1X8 LF (GAUZE/BANDAGES/DRESSINGS) ×2 IMPLANT
GAUZE XEROFORM 5X9 LF (GAUZE/BANDAGES/DRESSINGS) ×1 IMPLANT
GLOVE BIO SURGEON STRL SZ8 (GLOVE) ×2 IMPLANT
GLOVE BIOGEL PI IND STRL 8 (GLOVE) ×1 IMPLANT
GLOVE BIOGEL PI INDICATOR 8 (GLOVE) ×1
GLOVE ORTHO TXT STRL SZ7.5 (GLOVE) ×2 IMPLANT
GOWN EXTRA PROTECTION XXL 0583 (GOWNS) ×2 IMPLANT
GOWN STRL REUS W/ TWL LRG LVL3 (GOWN DISPOSABLE) ×2 IMPLANT
GOWN STRL REUS W/ TWL XL LVL3 (GOWN DISPOSABLE) ×2 IMPLANT
GOWN STRL REUS W/TWL LRG LVL3 (GOWN DISPOSABLE) ×4
GOWN STRL REUS W/TWL XL LVL3 (GOWN DISPOSABLE) ×4
IMMOBILIZER KNEE 22 UNIV (SOFTGOODS) IMPLANT
KIT BASIN OR (CUSTOM PROCEDURE TRAY) ×2 IMPLANT
KIT ROOM TURNOVER OR (KITS) ×2 IMPLANT
MANIFOLD NEPTUNE II (INSTRUMENTS) ×2 IMPLANT
NS IRRIG 1000ML POUR BTL (IV SOLUTION) ×2 IMPLANT
PACK ORTHO EXTREMITY (CUSTOM PROCEDURE TRAY) ×2 IMPLANT
PAD ARMBOARD 7.5X6 YLW CONV (MISCELLANEOUS) ×4 IMPLANT
PAD CAST 4YDX4 CTTN HI CHSV (CAST SUPPLIES) ×1 IMPLANT
PADDING CAST ABS 6INX4YD NS (CAST SUPPLIES) ×1
PADDING CAST ABS COTTON 6X4 NS (CAST SUPPLIES) IMPLANT
PADDING CAST COTTON 4X4 STRL (CAST SUPPLIES) ×2
PADDING CAST COTTON 6X4 STRL (CAST SUPPLIES) ×2 IMPLANT
PENCIL BUTTON HOLSTER BLD 10FT (ELECTRODE) ×2 IMPLANT
PLATE 10-HOLE (Plate) ×1 IMPLANT
SCREW 3.5X22MM (Screw) ×2 IMPLANT
SCREW 3.5X44MM (Screw) ×1 IMPLANT
SCREW CORTEX LOW PRO 3.5X24 (Screw) ×2 IMPLANT
SCREW CORTEX LOW PRO 3.5X26 (Screw) ×1 IMPLANT
SCREW CORTEX LOW PRO 3.5X28 (Screw) ×2 IMPLANT
SCREW CORTEX LOW PRO 3.5X30 (Screw) ×1 IMPLANT
SCREW LOCKING VA 2.7X38 (Screw) ×1 IMPLANT
SCREW LOCKING VA 2.7X40MM (Screw) ×1 IMPLANT
SCREW LOCKING VA 3.5X26MM (Screw) ×2 IMPLANT
SCREW VA LOCKING 2.7X36 (Screw) ×2 IMPLANT
SCREW VA LOCKING 2.7X36 VA (Screw) IMPLANT
SPONGE LAP 18X18 X RAY DECT (DISPOSABLE) ×2 IMPLANT
STAPLER VISISTAT 35W (STAPLE) ×2 IMPLANT
STOCKINETTE IMPERVIOUS 9X36 MD (GAUZE/BANDAGES/DRESSINGS) ×2 IMPLANT
STOCKINETTE IMPERVIOUS LG (DRAPES) ×2 IMPLANT
SUCTION FRAZIER TIP 10 FR DISP (SUCTIONS) ×2 IMPLANT
SUT VIC AB 0 CT1 27 (SUTURE) ×8
SUT VIC AB 0 CT1 27XBRD ANBCTR (SUTURE) ×2 IMPLANT
SUT VIC AB 2-0 CT1 27 (SUTURE) ×8
SUT VIC AB 2-0 CT1 TAPERPNT 27 (SUTURE) ×1 IMPLANT
SYR BULB IRRIGATION 50ML (SYRINGE) ×2 IMPLANT
TOWEL OR 17X24 6PK STRL BLUE (TOWEL DISPOSABLE) ×2 IMPLANT
TOWEL OR 17X26 10 PK STRL BLUE (TOWEL DISPOSABLE) ×2 IMPLANT
TUBE CONNECTING 12X1/4 (SUCTIONS) ×2 IMPLANT
UNDERPAD 30X30 INCONTINENT (UNDERPADS AND DIAPERS) ×2 IMPLANT
WATER STERILE IRR 1000ML POUR (IV SOLUTION) ×2 IMPLANT
YANKAUER SUCT BULB TIP NO VENT (SUCTIONS) ×2 IMPLANT

## 2015-08-17 NOTE — Discharge Instructions (Signed)

## 2015-08-17 NOTE — Op Note (Signed)
[name], [name] NO.:  [id number]  MEDICAL RECORD NO.:  [id number]  LOCATION:  [facility]                         FACILITY:  [facility]  PHYSICIAN:  Vanita Panda. Magnus Ivan, M.D.DATE OF BIRTH:  Nov 23, 1955  DATE OF PROCEDURE:  08/17/2015 DATE OF DISCHARGE:                              OPERATIVE REPORT   PREOPERATIVE DIAGNOSIS:  Left distal 3rd comminuted left tibial shaft fracture.  POSTOPERATIVE DIAGNOSIS:  Left distal 3rd comminuted left tibial shaft fracture.  PROCEDURE:  Open reduction and internal fixation of left distal 3rd comminuted tibial shaft fracture.  IMPLANTS:  Synthes 10-hole medial distal tibial locking plate.  SURGEON:  Vanita Panda. Magnus Ivan MD.  ANESTHESIA: 1. General. 2. Local with 0.25% plain Marcaine.  TOURNIQUET TIME:  Under an hour and half.  BLOOD LOSS:  Less than 100 mL.  COMPLICATIONS:  None.  INDICATIONS:  [name] is a 60 year old gentleman with multiple medical problems who also has bilateral knee replacements.  He is on chronic vancomycin due to osteomyelitis of his left wrist.  He is also on chronic narcotic pain medicines.  On Wednesday of this week, he sustained a mechanical fall down some stairs and sustained a comminuted closed left distal tibia fracture.  This was extra-articular, went up into the shaft.  Normally fracture like this could be treated with intramedullary nail, but he does have a total knee revision on the left side.  He is also on Eliquis, so we had to keep him off Eliquis for 2 days before proceeding to surgery.  We got him under ice and elevation and pain control.  At this point, he presents for surgery.  He understands the risks of acute blood loss anemia, nerve and vessel injury, nonunion, and mainly infection as well as DVT.  He is on Eliquis for previous DVT on the right side.  He is at high risk for infection given the fact that he does have osteomyelitis of the left wrist.  He has been treating  and continuing on vancomycin.  PROCEDURE DESCRIPTION:  After informed consent was obtained, appropriate left leg was marked.  He was brought to the operating room, placed supine on the operating table.  General anesthesia was then obtained.  A nonsterile tourniquet was placed around his upper left leg and his left leg was prepped and draped from the knee down the toes with DuraPrep and sterile drapes.  Time-out was called and he was identified as a correct patient, correct left leg.  I then made incision directly over the fracture site anteromedial and carried this proximally and distally.  We were able to dissect down the fracture upon highly comminuted fracture of the distal third tibial shaft.  This was extra-articular.  We had to tease the fracture fragments all in the place and found multiple fracture fragments.  We had to place at least 3 inter-frag screws to secure the fracture in place as well using reduction forceps.  We then chose a long Synthes periarticular medial plate because the fracture extending down distal.  We then secured the plate with a few bicortical screws as well as locking screws proximally and distally.  This was all done  under direct fluoroscopy and direct visualization.  Once we were done, we then irrigated the soft tissue with normal saline solution.  We were able to reapproximate tissue over the plate with 0 Vicryl followed by 2-0 Vicryl subcutaneous tissue, interrupted 2-0 nylon on the skin.  I then infiltrated the incision with 0.25% plain Marcaine.  Xeroform and well-padded sterile dressing applied.  The tourniquet was let down.  His toes were pink and he was awakened,  extubated, and taken to recovery room in stable condition.  All final counts were correct.  There were no complications noted.     Vanita Panda. Magnus Ivan, M.D.     CYB/MEDQ  D:  08/17/2015  T:  08/17/2015  Job:  [id number]

## 2015-08-17 NOTE — Anesthesia Preprocedure Evaluation (Signed)
Anesthesia Evaluation  Patient identified by MRN, date of birth, ID band Patient awake    Reviewed: Allergy & Precautions, H&P , NPO status , Patient's Chart, lab work & pertinent test results  History of Anesthesia Complications Negative for: history of anesthetic complications  Airway Mallampati: II  TM Distance: >3 FB Neck ROM: Full    Dental no notable dental hx. (+) Teeth Intact, Dental Advisory Given   Pulmonary neg pulmonary ROS, former smoker,    Pulmonary exam normal breath sounds clear to auscultation       Cardiovascular hypertension, Pt. on medications + Peripheral Vascular Disease  + dysrhythmias  Rhythm:Regular Rate:Normal     Neuro/Psych Anxiety negative neurological ROS     GI/Hepatic Neg liver ROS, PUD, GERD  ,  Endo/Other  negative endocrine ROS  Renal/GU Renal disease  negative genitourinary   Musculoskeletal  (+) Arthritis , Osteoarthritis,    Abdominal   Peds  Hematology negative hematology ROS (+)   Anesthesia Other Findings   Reproductive/Obstetrics negative OB ROS                             Anesthesia Physical Anesthesia Plan  ASA: II  Anesthesia Plan: General   Post-op Pain Management:    Induction: Intravenous  Airway Management Planned: Oral ETT  Additional Equipment:   Intra-op Plan:   Post-operative Plan: Extubation in OR  Informed Consent: I have reviewed the patients History and Physical, chart, labs and discussed the procedure including the risks, benefits and alternatives for the proposed anesthesia with the patient or authorized representative who has indicated his/her understanding and acceptance.   Dental advisory given  Plan Discussed with: CRNA and Surgeon  Anesthesia Plan Comments:         Anesthesia Quick Evaluation

## 2015-08-17 NOTE — Anesthesia Procedure Notes (Signed)
Procedure Name: Intubation Date/Time: 08/17/2015 7:43 AM Performed by: Yvonne Kendall S Patient Re-evaluated:Patient Re-evaluated prior to inductionOxygen Delivery Method: Circle system utilized Preoxygenation: Pre-oxygenation with 100% oxygen Intubation Type: IV induction Ventilation: Mask ventilation without difficulty Laryngoscope Size: Miller and 3 Grade View: Grade II Tube size: 7.5 mm Number of attempts: 1 Placement Confirmation: ETT inserted through vocal cords under direct vision,  positive ETCO2 and breath sounds checked- equal and bilateral Tube secured with: Tape Dental Injury: Teeth and Oropharynx as per pre-operative assessment

## 2015-08-17 NOTE — Progress Notes (Signed)
Patient ID: [name], male   DOB: 11/28/1955, 60 y.o.   MRN: [id number] [name] understands fully that we are proceeding to the OR this am for open reduction/internal fixation of his left tibia shaft fracture.  He understands the risks and benefits involved and informed consent is obtained.

## 2015-08-17 NOTE — Progress Notes (Signed)
Pharmacy Antibiotic Follow-up Note  [name] is a 60 y.o. year-old male admitted on 08/15/2015.  The patient is currently on Vancomycin for Osteomyelitis  Assessment/Plan: -Vancomycin trough is slightly low this AM -Increase Vancomycin to 1500 mg IV q12h -Re-check trough at steady state  Temp (24hrs), Avg:98.5 F (36.9 C), Min:98 F (36.7 C), Max:99.3 F (37.4 C)   Recent Labs Lab 08/15/15 1055  WBC 7.8    Recent Labs Lab 08/15/15 1055  CREATININE 1.03   Estimated Creatinine Clearance: 94.6 mL/min (by C-G formula based on Cr of 1.03).     Abran Duke PharmD 08/17/2015 5:42 AM

## 2015-08-17 NOTE — Brief Op Note (Signed)
08/15/2015 - 08/17/2015  9:36 AM  PATIENT:  [name]  60 y.o. male  PRE-OPERATIVE DIAGNOSIS:  left tibial saft fracture  POST-OPERATIVE DIAGNOSIS:  left tibial saft fracture  PROCEDURE:  Procedure(s): OPEN REDUCTION INTERNAL FIXATION (ORIF) TIBIA FRACTURE (Left)  SURGEON:  Surgeon(s) and Role:    * Kathryne Hitch, MD - Primary  ANESTHESIA:   general  EBL:  Total I/O In: 700 [I.V.:700] Out: -   LOCAL MEDICATIONS USED:  MARCAINE     COUNTS:  YES  TOURNIQUET:   Total Tourniquet Time Documented: Thigh (Left) - 80 minutes Total: Thigh (Left) - 80 minutes   DICTATION: .Other Dictation: Dictation Number [id number]  PLAN OF CARE: Admit to inpatient   PATIENT DISPOSITION:  PACU - hemodynamically stable.   Delay start of Pharmacological VTE agent (>24hrs) due to surgical blood loss or risk of bleeding: no

## 2015-08-17 NOTE — Transfer of Care (Signed)
Immediate Anesthesia Transfer of Care Note  Patient: [name]  Procedure(s) Performed: Procedure(s): OPEN REDUCTION INTERNAL FIXATION (ORIF) TIBIA FRACTURE (Left)  Patient Location: PACU  Anesthesia Type:General  Level of Consciousness: awake, alert  and oriented  Airway & Oxygen Therapy: Patient Spontanous Breathing and Patient connected to nasal cannula oxygen  Post-op Assessment: Report given to RN and Post -op Vital signs reviewed and stable  Post vital signs: Reviewed and stable  Last Vitals:  Filed Vitals:   08/17/15 0402 08/17/15 0940  BP: 108/63 112/79  Pulse: 77 54  Temp: 36.7 C   Resp: 16 10    Complications: No apparent anesthesia complications

## 2015-08-17 NOTE — Progress Notes (Signed)
Patient to OR

## 2015-08-18 LAB — CREATININE, SERUM
CREATININE: 0.98 mg/dL (ref 0.61–1.24)
GFR calc Af Amer: >60 mL/min (ref >60)

## 2015-08-18 MED ORDER — SODIUM CHLORIDE 0.9 % IJ SOLN
10.0000 mL | INTRAMUSCULAR | Status: DC | PRN
  Administered 2015-08-20: 10 mL
  Filled 2015-08-18: qty 40

## 2015-08-18 MED ORDER — ALTEPLASE 2 MG IJ SOLR
2.0000 mg | Freq: Once | INTRAMUSCULAR | Status: AC
Start: 2015-08-18 — End: 2015-08-18
  Administered 2015-08-18: 2 mg
  Filled 2015-08-18: qty 2

## 2015-08-18 NOTE — Progress Notes (Addendum)
Temperature 100.0 orally. Incentive spirometer encouraged.  Spirometer at  bedside and being used by patient now

## 2015-08-18 NOTE — Anesthesia Postprocedure Evaluation (Signed)
Anesthesia Post Note  Patient: [name]  Procedure(s) Performed: Procedure(s) (LRB): OPEN REDUCTION INTERNAL FIXATION (ORIF) TIBIA FRACTURE (Left)  Patient location during evaluation: PACU Anesthesia Type: General Level of consciousness: awake and alert Pain management: pain level controlled Vital Signs Assessment: post-procedure vital signs reviewed and stable Respiratory status: spontaneous breathing, nonlabored ventilation, respiratory function stable and patient connected to nasal cannula oxygen Cardiovascular status: blood pressure returned to baseline and stable Postop Assessment: no signs of nausea or vomiting Anesthetic complications: no    Last Vitals:  Filed Vitals:   08/18/15 0602 08/18/15 1254  BP: 124/68 108/64  Pulse: 98 84  Temp: 37.3 C   Resp: 18 20    Last Pain:  Filed Vitals:   08/18/15 1255  PainSc: 4                  Dare Spillman,JAMES TERRILL

## 2015-08-18 NOTE — Progress Notes (Signed)
Inpatient Rehabilitation  We received a prescreen request and a consult PMR consult has been ordered.  Await results of consult.  Admissions coordinator will follow up as appropriated once consult has been addressed.  Pt. On CIR 10/16.    Weldon Picking PT Inpatient Rehab Admissions Coordinator Cell [phone] Office [phone]

## 2015-08-18 NOTE — Evaluation (Signed)
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 Pt is motivated to work to get better, and will have assist at home; I favor considering CIR as I believe with intensive therapies he can reach a more independent and safe functional level; He has had success at CIR in teh past;  Patient will benefit from skilled PT to increase their independence and safety with mobility to allow discharge to the venue listed below.       Follow Up Recommendations CIR    Equipment Recommendations   (remains to be determined; he is pretty well-equipped)    Recommendations for Other Services OT consult;Rehab consult     Precautions / Restrictions Precautions Precautions: Fall Precaution Comments: difficulty with rise onto R LE Restrictions LUE Weight Bearing: Weight bear through elbow only LLE Weight Bearing: Non weight bearing      Mobility  Bed Mobility Overal bed mobility: Needs Assistance Bed Mobility: Supine to Sit     Supine to sit: Supervision     General bed mobility comments: Used rails to pull self to EOB and then push to sit once LEs cleared EOB; cues for technique  Transfers Overall transfer level: Needs assistance Equipment used: Left platform walker Transfers: Sit to/from Stand;Squat Pivot Transfers Sit to Stand: +2 physical assistance;Max assist   Squat  pivot transfers: +2 physical assistance;Mod assist     General transfer comment: Exteme difficulty with rise on RLE only, keeping LLE NWB -- unsuccessful with +2 Maximal assist; Opted then for squat pivot transfer to chair towards R side, and still with difficulty clearing armrest; perforemd pivot in two major pushes, the first with pt almost sitting on armrest, and teh second getting to seat of chair; very dependent on momentum  Ambulation/Gait             General Gait Details: Unable  Stairs            Wheelchair Mobility    Modified Rankin (Stroke Patients Only)       Balance                                             Pertinent Vitals/Pain Pain Assessment: 0-10 Pain Score: 6  Pain Location: L lower leg, when sitting with LLE in dependent position Pain Descriptors / Indicators: Aching Pain Intervention(s): Limited activity within patient's tolerance;Monitored during session;Premedicated before session;Repositioned    Home Living Family/patient expects to be discharged to:: Private residence Living Arrangements: Children (adult children) Available Help at Discharge: Family;Available 24 hours/day Type of Home: House Home Access: Stairs to enter (and a ramp) Entrance Stairs-Rails: Right;Left;Can reach both (through garage) Entrance Stairs-Number of Steps: 5 through garage; 4 through front Home Layout: Two level;1/2 bath on main level;Bed/bath upstairs Home Equipment: Wheelchair - manual;Shower seat (platform RW) Additional Comments: Apparently  had a rehab stay last year in October with successful dc home    Prior Function Level of Independence: Independent         Comments: Used to love playing golf and basketball; likes to watch sports on TV.  Is now on disability, former Therapist, occupational Dominance   Dominant Hand: Right    Extremity/Trunk Assessment   Upper Extremity Assessment: Defer to OT evaluation;LUE  deficits/detail (Kept wrist NWB with platform RW)           Lower Extremity Assessment: LLE deficits/detail   LLE Deficits / Details: grossly decr AROM and strength, limited by pain; difficulty getting knee fully straight; postitive active toe wiggle     Communication   Communication: No difficulties  Cognition Arousal/Alertness: Awake/alert Behavior During Therapy: WFL for tasks assessed/performed Overall Cognitive Status: Within Functional Limits for tasks assessed                      General Comments General comments (skin integrity, edema, etc.): Very motivated; states he would like as much therapy as he can get    Exercises        Assessment/Plan    PT Assessment Patient needs continued PT services  PT Diagnosis Difficulty walking;Generalized weakness;Acute pain   PT Problem List Decreased strength;Decreased range of motion;Decreased activity tolerance;Decreased balance;Decreased mobility;Decreased knowledge of use of DME;Decreased safety awareness;Pain;Decreased knowledge of precautions  PT Treatment Interventions DME instruction;Gait training;Stair training;Functional mobility training;Therapeutic activities;Therapeutic exercise;Balance training;Patient/family education;Wheelchair mobility training   PT Goals (Current goals can be found in the Care Plan section) Acute Rehab PT Goals Patient Stated Goal: Hopes to be able to get to CIR, get stronger and get better; wants to walk his daughter down the aisle in May PT Goal Formulation: With patient Time For Goal Achievement: 09/01/15 Potential to Achieve Goals: Good    Frequency Min 5X/week   Barriers to discharge        Co-evaluation               End of Session Equipment Utilized During Treatment: Gait belt Activity Tolerance: Patient tolerated treatment well Patient left: in chair;with call bell/phone within reach Nurse Communication: Mobility status;Need for lift equipment         Time:  1009-1033 PT Time Calculation (min) (ACUTE ONLY): 24 min   Charges:   PT Evaluation $PT Eval Moderate Complexity: 1 Procedure PT Treatments $Therapeutic Activity: 8-22 mins   PT G Codes:        [name] [facility] 08/18/2015, 11:37 AM  [name], PT  Acute Rehabilitation Services Pager [phone] Office [phone]

## 2015-08-18 NOTE — Progress Notes (Signed)
Patient ID: [name], male   DOB: 10-07-1955, 60 y.o.   MRN: [id number] Postoperative day 1 status post internal fixation left tibia as well as status post surgical debridement left wrist. Patient states he still feels unstable on his right leg and will be protected weightbearing on the left upper and left lower extremity. Patient may benefit from inpatient rehabilitation.

## 2015-08-19 ENCOUNTER — Encounter (HOSPITAL_COMMUNITY): Payer: Self-pay | Admitting: Orthopaedic Surgery

## 2015-08-19 ENCOUNTER — Ambulatory Visit: Payer: Medicaid Other | Admitting: Infectious Disease

## 2015-08-19 DIAGNOSIS — M869 Osteomyelitis, unspecified: Secondary | ICD-10-CM | POA: Insufficient documentation

## 2015-08-19 DIAGNOSIS — I471 Supraventricular tachycardia: Secondary | ICD-10-CM

## 2015-08-19 DIAGNOSIS — S82202S Unspecified fracture of shaft of left tibia, sequela: Secondary | ICD-10-CM

## 2015-08-19 DIAGNOSIS — D62 Acute posthemorrhagic anemia: Secondary | ICD-10-CM

## 2015-08-19 DIAGNOSIS — I10 Essential (primary) hypertension: Secondary | ICD-10-CM | POA: Insufficient documentation

## 2015-08-19 DIAGNOSIS — G8918 Other acute postprocedural pain: Secondary | ICD-10-CM

## 2015-08-19 DIAGNOSIS — Z89421 Acquired absence of other right toe(s): Secondary | ICD-10-CM

## 2015-08-19 LAB — VANCOMYCIN, TROUGH: VANCOMYCIN TR: 18 ug/mL (ref 10.0–20.0)

## 2015-08-19 NOTE — Progress Notes (Signed)
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 Completed safe sit to stand and stand pivot transfers this session. Continue to progress as tolerated with CIR remaining appropriate for d/c.   Follow Up Recommendations  CIR     Equipment Recommendations   (remains to be determined; he is pretty well-equipped)    Recommendations for Other Services OT consult;Rehab consult     Precautions / Restrictions Precautions Precautions: Fall Precaution Comments: difficulty with rise onto R LE Restrictions Weight Bearing Restrictions: Yes LUE Weight Bearing: Weight bear through elbow only LLE Weight Bearing: Non weight bearing    Mobility  Bed Mobility Overal bed mobility: Needs Assistance Bed Mobility: Supine to Sit     Supine to sit: Supervision     General bed mobility comments: supervision for safety; min use of bed rails and and HOB elevated 20 degrees  Transfers Overall transfer level: Needs assistance Equipment used: Left platform walker Transfers: Sit to/from Stand;Stand Pivot Transfers Sit to Stand: Max assist;+2 physical assistance Stand pivot transfers: +2 physical assistance;Max assist       General transfer comment: assistance to power up into standing and WS to R upon standing to maintain NWB L LE; 2 attempt before achieving stand; pt able to stand from elevated surface with vc for hand placement and  technique; pt able to maintian NWB status during stand pivot   Ambulation/Gait             General Gait Details: Unable   Stairs            Wheelchair Mobility    Modified Rankin (Stroke Patients Only)       Balance Overall balance assessment: Needs assistance Sitting-balance support: Feet supported Sitting balance-Leahy Scale: Good     Standing balance support: Bilateral upper extremity supported Standing balance-Leahy Scale: Zero                      Cognition Arousal/Alertness: Awake/alert Behavior During Therapy: WFL for tasks assessed/performed Overall Cognitive Status: Within Functional Limits for tasks assessed                      Exercises      General Comments General comments (skin integrity, edema, etc.): pt anxious about sit to stand but very willing to participate; pt with more confidence in his abililties after safe completion of transfers this session      Pertinent Vitals/Pain Pain Assessment: 0-10 Pain Score: 7  Pain Location: L LE Pain Descriptors / Indicators: Aching;Sore Pain Intervention(s): Limited activity within patient's tolerance;Monitored during session;RN gave pain meds during session;Patient requesting pain meds-RN notified;Repositioned;Ice applied    Home Living                      Prior Function            PT Goals (current goals can now be found in the care plan section)  Acute Rehab PT Goals Patient Stated Goal: Hopes to be able to get to CIR, get stronger and get better; wants to walk his daughter down the aisle in May PT Goal Formulation: With patient Time For Goal Achievement: 09/01/15 Potential to Achieve Goals: Good Progress towards PT goals: Progressing toward goals    Frequency  Min 5X/week    PT Plan Current plan remains appropriate    Co-evaluation             End of Session Equipment Utilized During Treatment: Gait belt Activity Tolerance: Patient tolerated  treatment well Patient left: in chair;with call bell/phone within reach     Time: 1329-1350 PT Time Calculation (min) (ACUTE ONLY): 21 min  Charges:  $Therapeutic Activity: 8-22 mins                    G Codes:      Derek Mound, PTA Pager: [phone]   08/19/2015, 3:11 PM

## 2015-08-19 NOTE — Progress Notes (Signed)
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 Pt currently requires max +2 assist for transfers and min assist for LB ADLs due to acute LLE and LUE fxs pain and generalized weakness. Attempted sit/stand transfers x2 today without success due to pt's increased pain and fatigue. Pt is very motivated and will have 24/7 assistance from wife and 2 sons at home. Feel pt is an excellent candidate for CIR as pt can make significant progress to maximize independence and safety with ADLs and decrease caregiver burden.    Follow Up Recommendations  CIR    Equipment Recommendations  Other (comment) (TBD in next venue)    Recommendations for Other Services       Precautions / Restrictions Precautions Precautions: Fall Precaution Comments: Attempting to WB on LLE to stand - requires 1 person to help elevate LLE during transfers Restrictions Weight Bearing Restrictions: Yes LUE Weight Bearing: Weight bear through elbow only LLE Weight Bearing: Non weight bearing      Mobility Bed Mobility Overal bed mobility: Needs Assistance Bed Mobility: Supine to Sit;Sit to Supine     Supine to sit: Min guard Sit to supine: Min guard   General bed mobility comments: HOB flat, use of bedrails to pull self up in bed and swing bil LE on/off bed. Verbal cues to position leg and foot to avoid WB on LLE.    Transfers Overall transfer level: Needs assistance Equipment used: Left platform walker Transfers: Sit to/from Stand Sit to Stand: Max assist;+2 physical assistance         General transfer comment: x2 attempts unsuccessful to reach full standing position. Did not continue attempting as pt reported significant increase in pain and fatigue after attempts. Pt attempted to WB on LLE despite verbal cues and will require physical assist to maintain NWB status.    Balance Overall balance assessment: Needs assistance Sitting-balance support: No upper extremity supported;Feet supported Sitting balance-Leahy Scale: Fair     Standing balance support: Bilateral upper extremity supported;During functional activity Standing balance-Leahy Scale: Zero                              ADL Overall ADL's : Needs assistance/impaired     Grooming: Wash/dry hands;Wash/dry face;Oral care;Set up;Sitting   Upper Body Bathing: Set up;Sitting   Lower Body Bathing: Set up;Sitting/lateral leans   Upper Body Dressing : Set up;Sitting   Lower Body Dressing: Minimal assistance;Sitting/lateral leans Lower Body Dressing Details (indicate cue type and reason): able to cross R ankle over L knee (prohibited from doing this with L ankle). Min assist to fully reach feet             Functional mobility during ADLs: Maximal assistance;+2 for physical assistance General ADL Comments: Unable to achieve full standing position to attempt transfers. Completed ADLs sitting EOB and reviewed pain management strategies (elevating, maintaining neutral hip position, and  icing LLE)     Vision Vision Assessment?: No apparent visual deficits   Perception     Praxis      Pertinent Vitals/Pain Pain Assessment: 0-10 Pain Score: 7  Pain Location: LLE Pain Descriptors / Indicators: Aching;Sore Pain Intervention(s): Limited activity within patient's tolerance;Monitored during session;Repositioned;Patient  requesting pain meds-RN notified;Ice applied     Hand Dominance Right   Extremity/Trunk Assessment Upper Extremity Assessment Upper Extremity Assessment: LUE deficits/detail LUE Deficits / Details: STRENGTH: elbow, shoulder 4/5, ROM: wfl LUE: Unable to fully assess due to immobilization LUE Coordination: decreased fine motor   Lower Extremity Assessment Lower Extremity Assessment: Generalized weakness;LLE deficits/detail RLE Deficits / Details: grossly decreased strength, hx of peripheral neuropathy RLE Sensation: history of peripheral neuropathy LLE Deficits / Details: grossly decreased strength, limited by pain, pt resting in hip ER (added pillows and blankets to keep leg in neutral) LLE: Unable to fully assess due to pain;Unable to fully assess due to immobilization LLE Sensation: history of peripheral neuropathy       Communication Communication Communication: No difficulties   Cognition Arousal/Alertness: Awake/alert Behavior During Therapy: WFL for tasks assessed/performed Overall Cognitive Status: Within Functional Limits for tasks assessed                     General Comments       Exercises       Shoulder Instructions      Home Living Family/patient expects to be discharged to:: Private residence Living Arrangements: Spouse/significant other;Children (2 sons) Available Help at Discharge: Family;Available 24 hours/day Type of Home: House Home Access: Stairs to enter [employer] of Steps: 5 through garage; 4 through front Entrance Stairs-Rails: Right;Left;Can reach both Home Layout: Two level;1/2 bath on main level;Bed/bath upstairs Alternate Level Stairs-Number of Steps: Flight Alternate Level Stairs-Rails: Right;Left;Can reach both Bathroom Shower/Tub: Tub/shower unit;Curtain Shower/tub characteristics: Engineer, building services: Standard Bathroom Accessibility: Yes How Accessible: Accessible via wheelchair;Accessible via walker Home  Equipment: Toilet riser;Grab bars - tub/shower;Hand held shower head;Wheelchair - Copy (platform RW)          Prior Functioning/Environment Level of Independence: Needs assistance  Gait / Transfers Assistance Needed: Using wheelchair for most mobility, occassionally ambulated with platform RW ADL's / Homemaking Assistance Needed: Assistance with LB dressing, sons complete cooking/cleaning   Comments: Used to love playing golf and basketball; likes to watch sports on TV.  Is now on disability, former Chiropractor    OT Diagnosis: Generalized weakness;Acute pain   OT Problem List: Decreased strength;Decreased range of motion;Decreased activity tolerance;Impaired balance (sitting and/or standing);Decreased coordination;Decreased safety awareness;Decreased knowledge of use of DME or AE;Decreased knowledge of precautions;Impaired sensation;Impaired UE functional use;Pain   OT Treatment/Interventions: Self-care/ADL training;Therapeutic exercise;Energy conservation;DME and/or AE instruction;Therapeutic activities;Patient/family education;Balance training    OT Goals(Current goals can be found in the care plan section) Acute Rehab OT Goals Patient Stated Goal: Hopes to be able to get to CIR, get stronger and get better; wants to walk his daughter down the aisle in May OT Goal Formulation: With patient Time For Goal Achievement: 09/02/15 Potential to Achieve Goals: Good ADL Goals Pt Will Perform Grooming: with mod assist;standing Pt Will Perform Lower Body Bathing: with min guard assist;with adaptive equipment;sitting/lateral leans;sit to/from stand Pt Will Perform Lower Body Dressing: with min guard assist;with adaptive equipment;sitting/lateral leans;sit to/from stand Pt Will Transfer to Toilet: with mod assist;bedside commode;stand pivot transfer (with L platform RW) Pt Will Perform Toileting - Clothing Manipulation and hygiene: with min assist;sitting/lateral leans Pt  Will Perform Tub/Shower Transfer: Tub transfer;with mod assist;Stand pivot transfer;ambulating;tub bench (with L platform RW)  OT Frequency: Min 3X/week   Barriers to D/C:            Co-evaluation              End of Session Equipment Utilized During Treatment: Gait belt;Other (comment) (L platform RW) Nurse Communication: Mobility status;Precautions;Weight bearing status  Activity Tolerance: Patient limited by fatigue;Patient limited by pain Patient left: in bed;with call bell/phone within reach;with bed alarm set   Time: 1610-9604 OT Time Calculation (min): 33 min Charges:  OT General Charges $OT Visit: 1 Procedure OT Evaluation $OT Eval Moderate Complexity: 1 Procedure OT Treatments $Self Care/Home Management : 8-22 mins G-Codes:    Nils Pyle, OTR/L Pager: [phone] 08/19/2015, 10:03 AM

## 2015-08-19 NOTE — Progress Notes (Signed)
Pharmacy Antibiotic Follow-up Note  [name] is a 60 y.o. year-old male admitted on 08/15/2015.  The patient is currently on Vancomycin for Osteomyelitis  Assessment/Plan: -Vancomycin trough is therapeutic this AM -Continue Vancomycin 1500 mg IV q12h -Re-check trough as clinically indicated   Temp (24hrs), Avg:98.8 F (37.1 C), Min:98.5 F (36.9 C), Max:99 F (37.2 C)   Recent Labs Lab 08/15/15 1055  WBC 7.8     Recent Labs Lab 08/15/15 1055 08/18/15 1425  CREATININE 1.03 0.98   Estimated Creatinine Clearance: 99.4 mL/min (by C-G formula based on Cr of 0.98).     Abran Duke PharmD 08/19/2015 6:56 AM

## 2015-08-19 NOTE — Progress Notes (Signed)
Orthopedic Tech Progress Note Patient Details:  [name] 26-Sep-1955 [id number]  Ortho Devices Type of Ortho Device: CAM walker Ortho Device/Splint Interventions: Application   Saul Fordyce 08/19/2015, 9:44 AM

## 2015-08-19 NOTE — Consult Note (Signed)
Physical Medicine and Rehabilitation Consult Reason for Consult: Left tibial fracture after fall Referring Physician: Dr. Magnus Ivan   HPI: [name] is a 60 y.o. 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 Lives with wife and family. Independent with a cane prior to admission. Two-level home with bedroom upstairs. Patient well known to inpatient rehabilitation services October 2016 for sepsis/multi-medical with multiple irrigation and debridements rectus flap procedure. Presented 08/15/2015 after a fall down some stairs when he tripped. Denied loss of consciousness. X-rays and imaging revealed left distal third comminuted tibia shaft fracture. Underwent ORIF 08/17/2015 per Dr. Magnus Ivan. Nonweightbearing left lower extremity and weightbearing through left elbow only for history of left wrist injury. Hospital course pain management. Most recent x-rays of left wrist shows diffuse marrow edema throughout the carpus, distal radius distal old. Cortical erosion of the ulnar aspect of distal radius/all. Overall changes similar to appearance from MRI 07/05/2015. Patient currently continues on intravenous vancomycin for recent I&D of left wrist. Physical therapy evaluation completed with recommendations of physical medicine rehabilitation consult.  Review of Systems  Constitutional: Negative for fever, chills and malaise/fatigue.  HENT: Negative for hearing loss.   Eyes: Negative for blurred vision and double vision.  Respiratory: Negative for cough and shortness of breath.   Cardiovascular: Positive for leg swelling. Negative for chest pain.  Gastrointestinal: Positive for constipation. Negative for nausea and vomiting.       GERD  Genitourinary: Negative for dysuria and hematuria.    Musculoskeletal: Positive for falls.  Skin: Negative for rash.  Neurological: Positive for headaches. Negative for seizures.  Psychiatric/Behavioral: Positive for depression.       Anxiety  All other systems reviewed and are negative.  Past Medical History  Diagnosis Date  . Hypertension   . Chronic knee pain     "both sides"  . Hyperlipemia   . PUD (peptic ulcer disease)   . Peripheral vascular disease (HCC)   . Anxiety   . GERD (gastroesophageal reflux disease)   . 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 Atrial fibrillation (HCC) dx'd 04/2015  . Chronic bronchitis (HCC)     "used to get it q yr; nothing in the last ~ 5 yrs" (08/15/2015)  . Migraine     "q 3 months" (08/15/2015)  . Osteoarthritis of both knees   . Depression    Past Surgical History  Procedure Laterality Date  . I&d extremity Left 07/12/2013    Procedure: I&D Left Thigh Abscess;  Surgeon: [name], MD;  Location: [facility] OR;  Service: Orthopedics;  Laterality: Left;  . Replacement total knee bilateral Bilateral ~ 2007-2013"    "left-right"  . Shoulder arthroscopy w/ rotator cuff repair Bilateral 1992-1993    "right-left"  . Elbow arthroscopy with tendon reconstruction Right 1990  . 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 Incision and drainage abscess Right 04/11/2015    Procedure: INCISION AND DRAINAGE OF RIGHT THIGH ABSCESS;  Surgeon: Fransisco Hertz, MD;  Location: [facility] OR;  Service: Vascular;  Laterality: Right;  . Incision and drainage abscess Right 04/13/2015    Procedure: INCISION AND DRAINAGE THIGH ABSCESS;  Surgeon: Fransisco Hertz, MD;  Location: [facility] OR;  Service: Vascular;  Laterality: Right;  . I&d extremity Left 04/17/2015    Procedure: IRRIGATION AND DEBRIDEMENT LEFT EXTREMITY;  Surgeon: Fransisco Hertz,  MD;  Location: [facility] OR;  Service: Vascular;  Laterality: Left;  Marland Kitchen Muscle flap closure Right 04/19/2015    Procedure: RIGHT RECTUS ABDOMINUS  FLAP TO RIGHT GROIN;  Surgeon: Glenna Fellows, MD;  Location: [facility] OR;  Service: Plastics;  Laterality: Right;  . Femoral revision Right 04/19/2015    Procedure: OVERSEW RIGHT FEMORAL VEIN ;  Surgeon: Chuck Hint, MD;  Location: [facility] OR;  Service: Vascular;  Laterality: Right;  . Wound exploration Right 04/23/2015    Procedure: EXAM UNDER ANESTHESIA AND VAC CHANGE RIGHT THIGH;  Surgeon: Glenna Fellows, MD;  Location: [facility] OR;  Service: Plastics;  Laterality: Right;  . Amputation Right 05/17/2015    Procedure: Right 1st and 2nd Toe Amputation;  Surgeon: Nadara Mustard, MD;  Location: [facility] OR;  Service: Orthopedics;  Laterality: Right;  . I&d extremity Left 07/09/2015    Procedure: IRRIGATION AND DEBRIDEMENT LEFT WRIST;  Surgeon: Knute Neu, MD;  Location: [facility] OR;  Service: Plastics;  Laterality: Left;  . I&d extremity Left 08/08/2015    Procedure: IRRIGATION AND DEBRIDEMENT EXTREMITY;  Surgeon: Knute Neu, MD;  Location: [facility] OR;  Service: Plastics;  Laterality: Left;  . Tonsillectomy  ~ 1972  . Appendectomy  ~ 1980  . Cholecystectomy  ~ 1982  . Inguinal hernia repair Right ~ 1969  . Hemorrhoidectomy with hemorrhoid banding  ~ 1985  . Knee arthroscopy Bilateral 1985-2008    "left-right"  . Joint replacement    . Uvulopalatoplasty  1991  . Cardiac catheterization  1993    "Dr. [place]; for vasospasms"   Family History  Problem Relation Age of Onset  . Heart attack Paternal Grandmother 86  . Heart disease Mother 90    arrythmia  . Stroke Paternal Grandfather   . Stroke Maternal Grandfather    Social History:  reports that he has quit smoking. His smoking use included Cigarettes. He has a 3 pack-year smoking history. He has never used smokeless tobacco. He reports that he does not drink alcohol or use illicit drugs. Allergies:  Allergies    Allergen Reactions  . Verapamil Other (See Comments)    Causes pvc's  . Imitrex [Sumatriptan] Other (See Comments)    Chest pain, 2004 (tolerates 50 mg prn)   Medications Prior to Admission  Medication Sig Dispense Refill  . albuterol (PROAIR HFA) 108 (90 BASE) MCG/ACT inhaler Inhale 1 puff into the lungs every 6 (six) hours as needed for wheezing or shortness of breath.     Marland Kitchen apixaban (ELIQUIS) 5 MG TABS tablet Take 1 tablet (5 mg total) by mouth 2 (two) times daily. 60 tablet 0  . atorvastatin (LIPITOR) 80 MG tablet Take 1 tablet (80 mg total) by mouth daily. 30 tablet 1  . docusate sodium (COLACE) 100 MG capsule Take 1 capsule (100 mg total) by mouth 2 (two) times daily. 10 capsule 0  . DULoxetine (CYMBALTA) 30 MG capsule Take 60 mg by mouth daily at 12 noon.  0  . flecainide (TAMBOCOR) 100 MG tablet Take 1 tablet (100 mg total) by mouth every 12 (twelve) hours. 60 tablet 1  . gabapentin (NEURONTIN) 100 MG capsule Take 1 capsule (100 mg total) by mouth 3 (three) times daily. 90 capsule 1  . Metoprolol Tartrate 75 MG TABS Take 75 mg by  mouth 2 (two) times daily. 60 tablet 1  . morphine (MS CONTIN) 30 MG 12 hr tablet Take 3 tablets (90 mg total) by mouth every 12 (twelve) hours. 7 tablet 0  . Multiple Vitamin (MULTIVITAMIN WITH MINERALS) TABS tablet Take 1 tablet by mouth daily.    . naloxegol oxalate (MOVANTIK) 25 MG TABS tablet Take 1 tablet (25 mg total) by mouth daily before breakfast. 30 tablet 0  . oxycodone (ROXICODONE) 30 MG immediate release tablet Take 1 tablet (30 mg total) by mouth every 6 (six) hours. scheduled 13 tablet 0  . polyethylene glycol (MIRALAX / GLYCOLAX) packet Take 17 g by mouth daily. 100 each 0  . temazepam (RESTORIL) 15 MG capsule Take 30 mg by mouth at bedtime.   0  . testosterone cypionate (DEPOTESTOSTERONE CYPIONATE) 200 MG/ML injection Inject 100 mg into the muscle every 14 (fourteen) days. Reported on 08/05/2015  0  . vancomycin 1,250 mg in sodium chloride  0.9 % 250 mL Inject 1,250 mg into the vein every 12 (twelve) hours. 60 Syringe 0  . venlafaxine XR (EFFEXOR-XR) 75 MG 24 hr capsule Take 1 capsule (75 mg total) by mouth daily with breakfast. 30 capsule 1  . Diclofenac-Misoprostol 75-0.2 MG TBEC     . famciclovir (FAMVIR) 500 MG tablet Take 500 mg by mouth 2 (two) times daily as needed (fever blisters).     . SUMAtriptan (IMITREX) 50 MG tablet Take 50 mg by mouth daily as needed for migraine or headache. Maximum 2 doses in 2 days  0  . trifluridine (VIROPTIC) 1 % ophthalmic solution Place 1 drop into both eyes 2 (two) times daily as needed (breakouts (blurry, matted eyelids)).      Home: Home Living Family/patient expects to be discharged to:: Private residence Living Arrangements: Children (adult children) Available Help at Discharge: Family, Available 24 hours/day Type of Home: House Home Access: Stairs to enter (and a ramp) Entrance Stairs-Number of Steps: 5 through garage; 4 through front Entrance Stairs-Rails: Right, Left, Can reach both (through garage) Home Layout: Two level, 1/2 bath on main level, Bed/bath upstairs Alternate Level Stairs-Number of Steps: Flight Alternate Level Stairs-Rails: Can reach both Bathroom Shower/Tub: Health visitor: Handicapped height Bathroom Accessibility: Yes Home Equipment: Wheelchair - manual, Shower seat (platform RW) Additional Comments: Apparently had a rehab stay last year in October with successful dc home  Functional History: Prior Function Level of Independence: Independent Comments: Used to love playing golf and basketball; likes to watch sports on TV.  Is now on disability, former Doctor, hospital Status:  Mobility: Bed Mobility Overal bed mobility: Needs Assistance Bed Mobility: Supine to Sit Supine to sit: Supervision General bed mobility comments: Used rails to pull self to EOB and then push to sit once LEs cleared EOB; cues for  technique Transfers Overall transfer level: Needs assistance Equipment used: Left platform walker Transfers: Sit to/from Stand, Squat Pivot Transfers Sit to Stand: +2 physical assistance, Max assist Squat pivot transfers: +2 physical assistance, Mod assist General transfer comment: Exteme difficulty with rise on RLE only, keeping LLE NWB -- unsuccessful with +2 Maximal assist; Opted then for squat pivot transfer to chair towards R side, and still with difficulty clearing armrest; perforemd pivot in two major pushes, the first with pt almost sitting on armrest, and teh second getting to seat of chair; very dependent on momentum Ambulation/Gait General Gait Details: Unable    ADL:    Cognition: Cognition Overall Cognitive Status: Within Functional Limits for tasks  assessed Orientation Level: Oriented X4 Cognition Arousal/Alertness: Awake/alert Behavior During Therapy: WFL for tasks assessed/performed Overall Cognitive Status: Within Functional Limits for tasks assessed  Blood pressure 100/69, pulse 89, temperature 98.5 F (36.9 C), temperature source Oral, resp. rate 18, height  (1.905 m), weight 100.6 kg (221 lb 12.5 oz), SpO2 97 %. Physical Exam  Vitals reviewed. Constitutional: He is oriented to person, place, and time. He appears well-developed and well-nourished.  HENT:  Head: Normocephalic and atraumatic.  Eyes: Conjunctivae and EOM are normal.  Neck: Normal range of motion. Neck supple. No thyromegaly present.  Cardiovascular: Normal rate and regular rhythm.   Respiratory: Effort normal and breath sounds normal. No respiratory distress.  GI: Soft. Bowel sounds are normal. He exhibits no distension.  Musculoskeletal: He exhibits edema and tenderness.  Neurological: He is alert and oriented to person, place, and time.  Motor: RUE 5/5 proximal to distal LUE 4/5 shoulder abduction, 4-/5 elbow flexion/extension, 3+/5 hand grip RLE: hip flexion, knee extension 4/5, ankle  dorsi/plantar flexion 4+/5 LLE: hip flexion 4/5, ankle dorsi/plantar flexion 4+/5  Sensation diminished symetrically distal to knees  Skin: Skin is warm and dry.  Left upper extremity with dressing in place appropriately tender.  Left lower extremity surgical dressing in place  Psychiatric: His speech is normal. Judgment normal. Cognition and memory are normal.    Results for orders placed or performed during the hospital encounter of 08/15/15 (from the past 24 hour(s))  Creatinine, serum     Status: None   Collection Time: 08/18/15  2:25 PM  Result Value Ref Range   Creatinine, Ser 0.98 0.61 - 1.24 mg/dL   GFR calc non Af Amer >60 >60 mL/min   GFR calc Af Amer >60 >60 mL/min   Dg Tibia/fibula Left Port  08/17/2015  CLINICAL DATA:  Known tibial fracture following fixation EXAM: PORTABLE LEFT TIBIA AND FIBULA - 2 VIEW COMPARISON:  08/15/2015 FINDINGS: Knee replacement is again identified. There is now a fixation sideplate and multiple fixation screws traversing the distal tibial fracture. The fracture fragments are in near anatomic alignment. Proximal fibular neck fracture is noted as well. IMPRESSION: Status post ORIF of distal tibial fracture. Electronically Signed   By: Alcide Clever M.D.   On: 08/17/2015 10:30    Assessment/Plan: Diagnosis: Left tibial fracture after fall Labs and images independently reviewed.  Records reviewed and summated above.  1. Does the need for close, 24 hr/day medical supervision in concert with the patient's rehab needs make it unreasonable for this patient to be served in a less intensive setting? Yes  2. Co-Morbidities requiring supervision/potential complications: post-op pain (Biofeedback training with therapies to help reduce reliance on opiate pain medications, monitor pain control during therapies, and sedation at rest and titrate to maximum efficacy to ensure participation and gains in therapies), HTN (monitor and provide prns in accordance with  increased physical exertion and pain), paroxysmal SVT (Cont meds), chronic diastolic congestive heart failure, chronic left wrist osteomyelitis (cont meds and dressing), right first and second toe amputations, ABLA (transfuse if necessary to ensure appropriate perfusion for increased activity tolerance) 3. Due to safety, skin/wound care, pain management and patient education, does the patient require 24 hr/day rehab nursing? Yes 4. Does the patient require coordinated care of a physician, rehab nurse, PT (1-2 hrs/day, 5 days/week) and OT (14-2 hrs/day, 5 days/week) to address physical and functional deficits in the context of the above medical diagnosis(es)? Yes Addressing deficits in the following areas: balance, endurance, locomotion, strength,  transferring, bathing, dressing, toileting and psychosocial support 5. Can the patient actively participate in an intensive therapy program of at least 3 hrs of therapy per day at least 5 days per week? Yes 6. The potential for patient to make measurable gains while on inpatient rehab is excellent 7. Anticipated functional outcomes upon discharge from inpatient rehab are min assist  with PT, min assist with OT, n/a with SLP. 8. Estimated rehab length of stay to reach the above functional goals is: 17-21 days. 9. Does the patient have adequate social supports and living environment to accommodate these discharge functional goals? Yes 10. Anticipated D/C setting: Home 11. Anticipated post D/C treatments: HH therapy and Home excercise program 12. Overall Rehab/Functional Prognosis: good  RECOMMENDATIONS: This patient's condition is appropriate for continued rehabilitative care in the following setting: CIR once pt demonstrates ability to tolerate therapy off of IV pain meds. Patient has agreed to participate in recommended program. Yes Note that insurance prior authorization may be required for reimbursement for recommended care.  Comment: Rehab Admissions  Coordinator to follow up.  Maryla Morrow, MD 08/19/2015

## 2015-08-19 NOTE — Progress Notes (Signed)
Patient ID: [name], male   DOB: Jun 19, 1956, 60 y.o.   MRN: [id number] No acute changes.  Very slow progress with mobility.  PT recommending Inpatient Rehab consultation.  Will be non-weight bearing on his left leg for at least 6 weeks.  Can put weight thru left wrist/forearm.  Dressings all clean and dry.  Medically stable at this standpoint.

## 2015-08-20 LAB — CBC
HEMATOCRIT: 31 % — AB (ref 39.0–52.0)
HEMOGLOBIN: 9.9 g/dL — AB (ref 13.0–17.0)
MCH: 27.3 pg (ref 26.0–34.0)
MCHC: 31.9 g/dL (ref 30.0–36.0)
MCV: 85.6 fL (ref 78.0–100.0)
Platelets: 218 10*3/uL (ref 150–400)
RBC: 3.62 MIL/uL — ABNORMAL LOW (ref 4.22–5.81)
RDW: 16.6 % — ABNORMAL HIGH (ref 11.5–15.5)
WBC: 6.9 10*3/uL (ref 4.0–10.5)

## 2015-08-20 NOTE — Progress Notes (Signed)
Pt has had fluctuating occurrences of visual hallucinations throughout the night. Multiple orientation assessments have proven the pt to be oriented x4; pt has been able to answer all questions appropriately. Pt is aware of his hallucinations, but is unable to "stop them." Per the pt and the pt's wife, this has only begun occurring this admission. Pt and pt's wife are concerned this is directly related to pt's Cymbalta. Pt is a high fall risk, but has made no attempt to get out of bed this shift. Bed alarm has remained on; camera on in pt's room as an additional safety measure. Nursing will continue to monitor closely.

## 2015-08-20 NOTE — Progress Notes (Signed)
Patient ID: [name], male   DOB: 1956/01/06, 60 y.o.   MRN: [id number] No acute changes.  Dressings changed on both his left wrist and left leg.  Awaiting word from Inpatient Rehab on his qualifying for it.  No longer needs an acute care bed.  Will also consult Social Work about short-term SNF placement if unable to get to CIR.

## 2015-08-20 NOTE — Progress Notes (Signed)
Rehab admissions - I met with patient.  He is well known to me from previous inpatient rehab stay.  I explained that rehab beds are full over next few days.  He does not want SNF.  He prefers to go home if I cannot get a bed for him.  He does have a wife at home who can assist some.  Call me for questions.  #[phone] 

## 2015-08-20 NOTE — Progress Notes (Signed)
Referred to CSW today for ?SNF. Chart reviewed and patient discussed in unit rounds with  [facility] and RN. CSW also spoke with  patient who declines SNF and plans to d/c home if not able to go to CIR. CSW to sign off- please contact us if SW needs arise. Reece Levy, MSW, Theresia Majors [phone]

## 2015-08-20 NOTE — Care Management Note (Addendum)
Case Management Note  Patient Details  Name: [name] MRN: [id number] Date of Birth: Jul 05, 1956  Subjective/Objective:              S/p left tibial shaft ORIF      Action/Plan: PT recommended CIR, inpatient rehab unable to take patient, patient refusing SNF, wishes to return home instead of SNF. Spoke with patient and informed him that home health PT for his diagnosis is not covered by his insurance. Patient does not want to pay out of pocket for home therapy. He still does not want SNF. Patient is active with [facility] for home IV antibiotics and they will continue to work with him, confirmed with Florentina Addison at [facility]. Patient stated that he has a wheelchair, walker with arm rest, tub bench and elevated toilet. No equipment needs identified. Patient stated that his wife, two sons and daughter will be able to assist him. Will continue to follow.    08/21/15 [facility] is able to admit patient today and he is agreeable to going. Patient to discharge to CIR today.  Expected Discharge Date:                  Expected Discharge Plan:  Home w Home Health Services  In-House Referral:  Clinical Social Work  Discharge planning Services  CM Consult  Post Acute Care Choice:  Resumption of Svcs/PTA Provider, Home Health Choice offered to:  Patient  DME Arranged:  N/A DME Agency:  NA  HH Arranged:  RN, IV Antibiotics HH Agency:  [facility]  Status of Service:  In process, will continue to follow  Medicare Important Message Given:    Date Medicare IM Given:    Medicare IM give by:    Date Additional Medicare IM Given:    Additional Medicare Important Message give by:     If discussed at Long Length of Stay Meetings, dates discussed:    Additional Comments:  Monica Becton, RN 08/20/2015, 3:34 PM

## 2015-08-20 NOTE — Progress Notes (Signed)
Physical Therapy Treatment Patient Details Name: Craig [name] MRN: [id number] DOB: 1956/01/08 Today's Date: 08/20/2015    History of Present Illness Admitted after fall [name] stairs resulting in L tibial fx, now s/p ORIF, NWB; Pertinent PMH of recent R wrist and metcarpal fxs with osteomyleitis and  multiple surgeries (since Sept), recent thrombosed R femoral aneurysm, R thigh abscess (multiple I&Ds since Sept), rectus abdominus flap to R groin, sepsis, recently diagnosed with CHF, R foot osteomyelitis resulting in partial amputation    PT Comments    Pt continues to make small gains toward mobility goals. Continue to progress as tolerated.   Follow Up Recommendations  CIR     Equipment Recommendations   (remains to be determined; he is pretty well-equipped)    Recommendations for Other Services OT consult;Rehab consult     Precautions / Restrictions Precautions Precautions: Fall Restrictions Weight Bearing Restrictions: Yes LUE Weight Bearing: Weight bear through elbow only LLE Weight Bearing: Non weight bearing    Mobility  Bed Mobility Overal bed mobility: Modified Independent Bed Mobility: Supine to Sit     Supine to sit: Modified independent (Device/Increase time)     General bed mobility comments: increased time needed   Transfers Overall transfer level: Needs assistance Equipment used: Left platform walker Transfers: Sit to/from Stand Sit to Stand: +2 physical assistance;Mod assist Stand pivot transfers: +2 physical assistance;Mod assist       General transfer comment: vc for hand/foot placement and technique with assist to power up into standing; assist for pivot to chair with vc for sequencing; pt able to maintain NWB throughout pivot transfer and improved ability with sit to stand  Ambulation/Gait             General Gait Details: Unable   Stairs            Wheelchair Mobility    Modified Rankin (Stroke Patients Only)       Balance      Sitting balance-Leahy Scale: Good       Standing balance-Leahy Scale: Zero                      Cognition Arousal/Alertness: Awake/alert Behavior During Therapy: WFL for tasks assessed/performed Overall Cognitive Status: Within Functional Limits for tasks assessed                      Exercises General Exercises - Lower Extremity Long Arc Quad: AROM;Both;10 reps;Seated Hip ABduction/ADduction: AROM;Both;10 reps;Seated Hip Flexion/Marching: AROM;Both;10 reps;Seated Toe Raises: AROM;Both;10 reps;Seated    General Comments General comments (skin integrity, edema, etc.): pt appeared to have more confidence today but is still weak and shaky when in standing position      Pertinent Vitals/Pain      Home Living                      Prior Function            PT Goals (current goals can now be found in the care plan section) Acute Rehab PT Goals Patient Stated Goal: Hopes to be able to get to CIR, get stronger and get better; wants to walk his daughter [name] the aisle in May PT Goal Formulation: With patient Time For Goal Achievement: 09/01/15 Potential to Achieve Goals: Good Progress towards PT goals: Progressing toward goals    Frequency  Min 5X/week    PT Plan Current plan remains appropriate    Co-evaluation  End of Session Equipment Utilized During Treatment: Gait belt Activity Tolerance: Patient tolerated treatment well Patient left: in chair;with call bell/phone within reach     Time: 0913-0928 PT Time Calculation (min) (ACUTE ONLY): 15 min  Charges:  $Therapeutic Activity: 8-22 mins                    G Codes:      Craig [name], PTA Pager: [phone]   08/20/2015, 10:46 AM

## 2015-08-21 ENCOUNTER — Inpatient Hospital Stay (HOSPITAL_COMMUNITY)
Admission: RE | Admit: 2015-08-21 | Discharge: 2015-08-31 | DRG: 560 | Disposition: A | Discharge status: Home Health | Payer: Medicaid Other | Source: Intra-hospital | Attending: Physical Medicine & Rehabilitation | Admitting: Physical Medicine & Rehabilitation

## 2015-08-21 ENCOUNTER — Encounter (HOSPITAL_COMMUNITY): Payer: Self-pay | Admitting: *Deleted

## 2015-08-21 DIAGNOSIS — I5032 Chronic diastolic (congestive) heart failure: Secondary | ICD-10-CM | POA: Diagnosis present

## 2015-08-21 DIAGNOSIS — I10 Essential (primary) hypertension: Secondary | ICD-10-CM

## 2015-08-21 DIAGNOSIS — G8918 Other acute postprocedural pain: Secondary | ICD-10-CM | POA: Diagnosis present

## 2015-08-21 DIAGNOSIS — M869 Osteomyelitis, unspecified: Secondary | ICD-10-CM | POA: Diagnosis not present

## 2015-08-21 DIAGNOSIS — S82202S Unspecified fracture of shaft of left tibia, sequela: Secondary | ICD-10-CM | POA: Diagnosis not present

## 2015-08-21 DIAGNOSIS — M8668 Other chronic osteomyelitis, other site: Secondary | ICD-10-CM | POA: Diagnosis present

## 2015-08-21 DIAGNOSIS — W19XXXD Unspecified fall, subsequent encounter: Secondary | ICD-10-CM | POA: Diagnosis present

## 2015-08-21 DIAGNOSIS — I471 Supraventricular tachycardia: Secondary | ICD-10-CM | POA: Diagnosis present

## 2015-08-21 DIAGNOSIS — F329 Major depressive disorder, single episode, unspecified: Secondary | ICD-10-CM | POA: Diagnosis present

## 2015-08-21 DIAGNOSIS — G894 Chronic pain syndrome: Secondary | ICD-10-CM

## 2015-08-21 DIAGNOSIS — R2689 Other abnormalities of gait and mobility: Secondary | ICD-10-CM | POA: Diagnosis present

## 2015-08-21 DIAGNOSIS — B9562 Methicillin resistant Staphylococcus aureus infection as the cause of diseases classified elsewhere: Secondary | ICD-10-CM | POA: Diagnosis present

## 2015-08-21 DIAGNOSIS — R609 Edema, unspecified: Secondary | ICD-10-CM | POA: Diagnosis not present

## 2015-08-21 DIAGNOSIS — S82202A Unspecified fracture of shaft of left tibia, initial encounter for closed fracture: Secondary | ICD-10-CM | POA: Diagnosis present

## 2015-08-21 DIAGNOSIS — R269 Unspecified abnormalities of gait and mobility: Secondary | ICD-10-CM

## 2015-08-21 DIAGNOSIS — Z89421 Acquired absence of other right toe(s): Secondary | ICD-10-CM | POA: Diagnosis not present

## 2015-08-21 DIAGNOSIS — E785 Hyperlipidemia, unspecified: Secondary | ICD-10-CM | POA: Diagnosis present

## 2015-08-21 DIAGNOSIS — S82252D Displaced comminuted fracture of shaft of left tibia, subsequent encounter for closed fracture with routine healing: Secondary | ICD-10-CM | POA: Diagnosis present

## 2015-08-21 DIAGNOSIS — F32A Depression, unspecified: Secondary | ICD-10-CM | POA: Insufficient documentation

## 2015-08-21 LAB — AFB CULTURE WITH SMEAR (NOT AT ARMC): Acid Fast Smear: NONE SEEN

## 2015-08-21 MED ORDER — VENLAFAXINE HCL ER 75 MG PO CP24
75.0000 mg | ORAL_CAPSULE | Freq: Every day | ORAL | Status: DC
  Administered 2015-08-22 – 2015-08-31 (×10): 75 mg via ORAL
  Filled 2015-08-21 (×14): qty 1

## 2015-08-21 MED ORDER — ALBUTEROL SULFATE (2.5 MG/3ML) 0.083% IN NEBU: 2.5000 mg | INHALATION_SOLUTION | Freq: Four times a day (QID) | RESPIRATORY_TRACT | Status: DC | PRN

## 2015-08-21 MED ORDER — ADULT MULTIVITAMIN W/MINERALS CH
1.0000 | ORAL_TABLET | Freq: Every day | ORAL | Status: DC
  Administered 2015-08-22 – 2015-08-31 (×10): 1 via ORAL
  Filled 2015-08-21 (×10): qty 1

## 2015-08-21 MED ORDER — METOPROLOL TARTRATE 50 MG PO TABS
75.0000 mg | ORAL_TABLET | Freq: Two times a day (BID) | ORAL | Status: DC
  Administered 2015-08-22 – 2015-08-31 (×16): 75 mg via ORAL
  Filled 2015-08-21 (×20): qty 1

## 2015-08-21 MED ORDER — METHOCARBAMOL 500 MG PO TABS
500.0000 mg | ORAL_TABLET | Freq: Four times a day (QID) | ORAL | Status: DC | PRN
  Administered 2015-08-22 – 2015-08-27 (×8): 500 mg via ORAL
  Filled 2015-08-21 (×8): qty 1

## 2015-08-21 MED ORDER — OXYCODONE HCL 5 MG PO TABS
30.0000 mg | ORAL_TABLET | Freq: Four times a day (QID) | ORAL | Status: DC
  Administered 2015-08-21 – 2015-08-23 (×7): 30 mg via ORAL
  Filled 2015-08-21 (×7): qty 6

## 2015-08-21 MED ORDER — HYDROMORPHONE HCL 2 MG PO TABS
2.0000 mg | ORAL_TABLET | Freq: Once | ORAL | Status: AC
  Administered 2015-08-21: 2 mg via ORAL
  Filled 2015-08-21: qty 1

## 2015-08-21 MED ORDER — VANCOMYCIN HCL 10 G IV SOLR
1500.0000 mg | Freq: Two times a day (BID) | INTRAVENOUS | Status: DC
  Administered 2015-08-21 – 2015-08-26 (×10): 1500 mg via INTRAVENOUS
  Filled 2015-08-21 (×14): qty 1500

## 2015-08-21 MED ORDER — ACETAMINOPHEN 325 MG PO TABS: 325.0000 mg | ORAL_TABLET | ORAL | Status: DC | PRN

## 2015-08-21 MED ORDER — GABAPENTIN 100 MG PO CAPS
100.0000 mg | ORAL_CAPSULE | Freq: Three times a day (TID) | ORAL | Status: DC
Start: 2015-08-21 — End: 2015-08-31
  Administered 2015-08-21 – 2015-08-31 (×30): 100 mg via ORAL
  Filled 2015-08-21 (×30): qty 1

## 2015-08-21 MED ORDER — FLECAINIDE ACETATE 100 MG PO TABS
100.0000 mg | ORAL_TABLET | Freq: Two times a day (BID) | ORAL | Status: DC
  Administered 2015-08-21 – 2015-08-31 (×20): 100 mg via ORAL
  Filled 2015-08-21 (×26): qty 1

## 2015-08-21 MED ORDER — ONDANSETRON HCL 4 MG PO TABS: 4.0000 mg | ORAL_TABLET | Freq: Four times a day (QID) | ORAL | Status: DC | PRN

## 2015-08-21 MED ORDER — SORBITOL 70 % SOLN: 30.0000 mL | Freq: Every day | Status: DC | PRN

## 2015-08-21 MED ORDER — POLYETHYLENE GLYCOL 3350 17 G PO PACK
17.0000 g | PACK | Freq: Every day | ORAL | Status: DC
  Administered 2015-08-23 – 2015-08-31 (×9): 17 g via ORAL
  Filled 2015-08-21 (×10): qty 1

## 2015-08-21 MED ORDER — ONDANSETRON HCL 4 MG/2ML IJ SOLN: 4.0000 mg | Freq: Four times a day (QID) | INTRAMUSCULAR | Status: DC | PRN

## 2015-08-21 MED ORDER — ATORVASTATIN CALCIUM 80 MG PO TABS
80.0000 mg | ORAL_TABLET | Freq: Every day | ORAL | Status: DC
Start: 2015-08-22 — End: 2015-08-31
  Administered 2015-08-22 – 2015-08-31 (×10): 80 mg via ORAL
  Filled 2015-08-21 (×13): qty 1

## 2015-08-21 MED ORDER — SODIUM CHLORIDE 0.9% FLUSH
10.0000 mL | INTRAVENOUS | Status: DC | PRN
  Administered 2015-08-22 – 2015-08-23 (×2): 10 mL
  Administered 2015-08-24: 20 mL
  Administered 2015-08-25: 10 mL
  Administered 2015-08-25: 40 mL
  Administered 2015-08-26: 20 mL
  Administered 2015-08-26 (×2): 10 mL
  Administered 2015-08-27 – 2015-08-28 (×4): 20 mL
  Administered 2015-08-30: 10 mL
  Filled 2015-08-21 (×13): qty 40

## 2015-08-21 MED ORDER — MORPHINE SULFATE ER 60 MG PO TBCR
90.0000 mg | EXTENDED_RELEASE_TABLET | Freq: Two times a day (BID) | ORAL | Status: DC
  Administered 2015-08-21 – 2015-08-31 (×20): 90 mg via ORAL
  Filled 2015-08-21: qty 2
  Filled 2015-08-21 (×5): qty 1
  Filled 2015-08-21: qty 2
  Filled 2015-08-21 (×3): qty 1
  Filled 2015-08-21: qty 2
  Filled 2015-08-21 (×5): qty 1
  Filled 2015-08-21: qty 2
  Filled 2015-08-21 (×3): qty 1

## 2015-08-21 MED ORDER — APIXABAN 5 MG PO TABS
5.0000 mg | ORAL_TABLET | Freq: Two times a day (BID) | ORAL | Status: DC
Start: 2015-08-21 — End: 2015-08-31
  Administered 2015-08-21 – 2015-08-31 (×20): 5 mg via ORAL
  Filled 2015-08-21 (×20): qty 1

## 2015-08-21 MED ORDER — DOCUSATE SODIUM 100 MG PO CAPS
100.0000 mg | ORAL_CAPSULE | Freq: Two times a day (BID) | ORAL | Status: DC
  Administered 2015-08-21 – 2015-08-31 (×20): 100 mg via ORAL
  Filled 2015-08-21 (×21): qty 1

## 2015-08-21 NOTE — Progress Notes (Signed)
Orthopedic Tech Progress Note Patient Details:  [name] [birth year] [id number]  Ortho Devices Type of Ortho Device: Velcro wrist splint Ortho Device/Splint Location: lue Ortho Device/Splint Interventions: Application   [name] 08/21/2015, 9:07 AM

## 2015-08-21 NOTE — Progress Notes (Signed)
[name], MD Physician Signed Physical Medicine and Rehabilitation Consult Note 08/19/2015 6:46 AM  Related encounter: ED to Hosp-Admission (Current) from 08/15/2015 in [facility]    Expand All Collapse All        Physical Medicine and Rehabilitation Consult Reason for Consult: Left tibial fracture after fall Referring Physician: Dr. Magnus Ivan   HPI: AULTON ROUTT is a 60 y.o. 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 Lives with wife and family. Independent with a cane prior to admission. Two-level home with bedroom upstairs. Patient well known to inpatient rehabilitation services October 2016 for sepsis/multi-medical with multiple irrigation and debridements rectus flap procedure. Presented 08/15/2015 after a fall down some stairs when he tripped. Denied loss of consciousness. X-rays and imaging revealed left distal third comminuted tibia shaft fracture. Underwent ORIF 08/17/2015 per Dr. Magnus Ivan. Nonweightbearing left lower extremity and weightbearing through left elbow only for history of left wrist injury. Hospital course pain management. Most recent x-rays of left wrist shows diffuse marrow edema throughout the carpus, distal radius distal old. Cortical erosion of the ulnar aspect of distal radius/all. Overall changes similar to appearance from MRI 07/05/2015. Patient currently continues on intravenous vancomycin for recent I&D of left wrist. Physical therapy evaluation completed with recommendations of physical medicine rehabilitation consult.  Review of Systems  Constitutional: Negative for fever, chills and malaise/fatigue.  HENT: Negative for hearing loss.  Eyes: Negative for blurred vision and double vision.  Respiratory: Negative for  cough and shortness of breath.  Cardiovascular: Positive for leg swelling. Negative for chest pain.  Gastrointestinal: Positive for constipation. Negative for nausea and vomiting.   GERD  Genitourinary: Negative for dysuria and hematuria.  Musculoskeletal: Positive for falls.  Skin: Negative for rash.  Neurological: Positive for headaches. Negative for seizures.  Psychiatric/Behavioral: Positive for depression.   Anxiety  All other systems reviewed and are negative.  Past Medical History  Diagnosis Date  . Hypertension   . Chronic knee pain     "both sides"  . Hyperlipemia   . PUD (peptic ulcer disease)   . Peripheral vascular disease (HCC)   . Anxiety   . GERD (gastroesophageal reflux disease)   . 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 Atrial fibrillation (HCC) dx'd 04/2015  . Chronic bronchitis (HCC)     "used to get it q yr; nothing in the last ~ 5 yrs" (08/15/2015)  . Migraine     "q 3 months" (08/15/2015)  . Osteoarthritis of both knees   . Depression    Past Surgical History  Procedure Laterality Date  . I&d extremity Left 07/12/2013    Procedure: I&D Left Thigh Abscess; Surgeon: [name], MD; Location: [facility] OR; Service: Orthopedics; Laterality: Left;  . Replacement total knee bilateral Bilateral ~ 2007-2013"    "left-right"  . Shoulder arthroscopy w/ rotator cuff repair Bilateral 1992-1993    "right-left"  . Elbow arthroscopy with tendon reconstruction Right 1990  . 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 Incision and drainage abscess Right 04/11/2015    Procedure: INCISION AND DRAINAGE OF RIGHT THIGH ABSCESS; Surgeon: Fransisco Hertz, MD; Location: [facility]  OR; Service:  Vascular; Laterality: Right;  . Incision and drainage abscess Right 04/13/2015    Procedure: INCISION AND DRAINAGE THIGH ABSCESS; Surgeon: Fransisco Hertz, MD; Location: [facility] OR; Service: Vascular; Laterality: Right;  . I&d extremity Left 04/17/2015    Procedure: IRRIGATION AND DEBRIDEMENT LEFT EXTREMITY; Surgeon: Fransisco Hertz, MD; Location: [facility] OR; Service: Vascular; Laterality: Left;  Marland Kitchen Muscle flap closure Right 04/19/2015    Procedure: RIGHT RECTUS ABDOMINUS FLAP TO RIGHT GROIN; Surgeon: Glenna Fellows, MD; Location: [facility] OR; Service: Plastics; Laterality: Right;  . Femoral revision Right 04/19/2015    Procedure: OVERSEW RIGHT FEMORAL VEIN ; Surgeon: Chuck Hint, MD; Location: [facility] OR; Service: Vascular; Laterality: Right;  . Wound exploration Right 04/23/2015    Procedure: EXAM UNDER ANESTHESIA AND VAC CHANGE RIGHT THIGH; Surgeon: Glenna Fellows, MD; Location: [facility] OR; Service: Plastics; Laterality: Right;  . Amputation Right 05/17/2015    Procedure: Right 1st and 2nd Toe Amputation; Surgeon: Nadara Mustard, MD; Location: [facility] OR; Service: Orthopedics; Laterality: Right;  . I&d extremity Left 07/09/2015    Procedure: IRRIGATION AND DEBRIDEMENT LEFT WRIST; Surgeon: Knute Neu, MD; Location: [facility] OR; Service: Plastics; Laterality: Left;  . I&d extremity Left 08/08/2015    Procedure: IRRIGATION AND DEBRIDEMENT EXTREMITY; Surgeon: Knute Neu, MD; Location: [facility] OR; Service: Plastics; Laterality: Left;  . Tonsillectomy  ~ 1972  . Appendectomy  ~ 1980  . Cholecystectomy  ~ 1982  . Inguinal hernia repair Right ~ 1969  . Hemorrhoidectomy with hemorrhoid banding  ~ 1985  . Knee arthroscopy Bilateral 1985-2008    "left-right"  . Joint replacement    . Uvulopalatoplasty  1991  . Cardiac catheterization  1993    "Dr. [place]; for vasospasms"   Family  History  Problem Relation Age of Onset  . Heart attack Paternal Grandmother 63  . Heart disease Mother 56    arrythmia  . Stroke Paternal Grandfather   . Stroke Maternal Grandfather    Social History:  reports that he has quit smoking. His smoking use included Cigarettes. He has a 3 pack-year smoking history. He has never used smokeless tobacco. He reports that he does not drink alcohol or use illicit drugs. Allergies:  Allergies  Allergen Reactions  . Verapamil Other (See Comments)    Causes pvc's  . Imitrex [Sumatriptan] Other (See Comments)    Chest pain, 2004 (tolerates 50 mg prn)   Medications Prior to Admission  Medication Sig Dispense Refill  . albuterol (PROAIR HFA) 108 (90 BASE) MCG/ACT inhaler Inhale 1 puff into the lungs every 6 (six) hours as needed for wheezing or shortness of breath.     Marland Kitchen apixaban (ELIQUIS) 5 MG TABS tablet Take 1 tablet (5 mg total) by mouth 2 (two) times daily. 60 tablet 0  . atorvastatin (LIPITOR) 80 MG tablet Take 1 tablet (80 mg total) by mouth daily. 30 tablet 1  . docusate sodium (COLACE) 100 MG capsule Take 1 capsule (100 mg total) by mouth 2 (two) times daily. 10 capsule 0  . DULoxetine (CYMBALTA) 30 MG capsule Take 60 mg by mouth daily at 12 noon.  0  . flecainide (TAMBOCOR) 100 MG tablet Take 1 tablet (100 mg total) by mouth every 12 (twelve) hours. 60 tablet 1  . gabapentin (NEURONTIN) 100 MG capsule Take 1 capsule (100 mg total) by mouth 3 (three) times daily. 90 capsule 1  . Metoprolol Tartrate 75 MG TABS Take 75 mg by mouth 2 (two) times daily. 60 tablet 1  . morphine (MS CONTIN) 30  MG 12 hr tablet Take 3 tablets (90 mg total) by mouth every 12 (twelve) hours. 7 tablet 0  . Multiple Vitamin (MULTIVITAMIN WITH MINERALS) TABS tablet Take 1 tablet by mouth daily.    . naloxegol oxalate (MOVANTIK) 25 MG TABS tablet Take 1 tablet (25 mg total) by mouth  daily before breakfast. 30 tablet 0  . oxycodone (ROXICODONE) 30 MG immediate release tablet Take 1 tablet (30 mg total) by mouth every 6 (six) hours. scheduled 13 tablet 0  . polyethylene glycol (MIRALAX / GLYCOLAX) packet Take 17 g by mouth daily. 100 each 0  . temazepam (RESTORIL) 15 MG capsule Take 30 mg by mouth at bedtime.   0  . testosterone cypionate (DEPOTESTOSTERONE CYPIONATE) 200 MG/ML injection Inject 100 mg into the muscle every 14 (fourteen) days. Reported on 08/05/2015  0  . vancomycin 1,250 mg in sodium chloride 0.9 % 250 mL Inject 1,250 mg into the vein every 12 (twelve) hours. 60 Syringe 0  . venlafaxine XR (EFFEXOR-XR) 75 MG 24 hr capsule Take 1 capsule (75 mg total) by mouth daily with breakfast. 30 capsule 1  . Diclofenac-Misoprostol 75-0.2 MG TBEC     . famciclovir (FAMVIR) 500 MG tablet Take 500 mg by mouth 2 (two) times daily as needed (fever blisters).     . SUMAtriptan (IMITREX) 50 MG tablet Take 50 mg by mouth daily as needed for migraine or headache. Maximum 2 doses in 2 days  0  . trifluridine (VIROPTIC) 1 % ophthalmic solution Place 1 drop into both eyes 2 (two) times daily as needed (breakouts (blurry, matted eyelids)).      Home: Home Living Family/patient expects to be discharged to:: Private residence Living Arrangements: Children (adult children) Available Help at Discharge: Family, Available 24 hours/day Type of Home: House Home Access: Stairs to enter (and a ramp) Entrance Stairs-Number of Steps: 5 through garage; 4 through front Entrance Stairs-Rails: Right, Left, Can reach both (through garage) Home Layout: Two level, 1/2 bath on main level, Bed/bath upstairs Alternate Level Stairs-Number of Steps: Flight Alternate Level Stairs-Rails: Can reach both Bathroom Shower/Tub: Health visitor: Handicapped height Bathroom Accessibility: Yes Home Equipment: Wheelchair - manual, Shower seat  (platform RW) Additional Comments: Apparently had a rehab stay last year in October with successful dc home  Functional History: Prior Function Level of Independence: Independent Comments: Used to love playing golf and basketball; likes to watch sports on TV. Is now on disability, former Doctor, hospital Status:  Mobility: Bed Mobility Overal bed mobility: Needs Assistance Bed Mobility: Supine to Sit Supine to sit: Supervision General bed mobility comments: Used rails to pull self to EOB and then push to sit once LEs cleared EOB; cues for technique Transfers Overall transfer level: Needs assistance Equipment used: Left platform walker Transfers: Sit to/from Stand, Squat Pivot Transfers Sit to Stand: +2 physical assistance, Max assist Squat pivot transfers: +2 physical assistance, Mod assist General transfer comment: Exteme difficulty with rise on RLE only, keeping LLE NWB -- unsuccessful with +2 Maximal assist; Opted then for squat pivot transfer to chair towards R side, and still with difficulty clearing armrest; perforemd pivot in two major pushes, the first with pt almost sitting on armrest, and teh second getting to seat of chair; very dependent on momentum Ambulation/Gait General Gait Details: Unable    ADL:    Cognition: Cognition Overall Cognitive Status: Within Functional Limits for tasks assessed Orientation Level: Oriented X4 Cognition Arousal/Alertness: Awake/alert Behavior During Therapy: [facility] for tasks assessed/performed  Overall Cognitive Status: Within Functional Limits for tasks assessed  Blood pressure 100/69, pulse 89, temperature 98.5 F (36.9 C), temperature source Oral, resp. rate 18, height 6\' 3"  (1.905 m), weight 100.6 kg (221 lb 12.5 oz), SpO2 97 %. Physical Exam  Vitals reviewed. Constitutional: He is oriented to person, place, and time. He appears well-developed and well-nourished.  HENT:  Head: Normocephalic and atraumatic.    Eyes: Conjunctivae and EOM are normal.  Neck: Normal range of motion. Neck supple. No thyromegaly present.  Cardiovascular: Normal rate and regular rhythm.  Respiratory: Effort normal and breath sounds normal. No respiratory distress.  GI: Soft. Bowel sounds are normal. He exhibits no distension.  Musculoskeletal: He exhibits edema and tenderness.  Neurological: He is alert and oriented to person, place, and time.  Motor: RUE 5/5 proximal to distal LUE 4/5 shoulder abduction, 4-/5 elbow flexion/extension, 3+/5 hand grip RLE: hip flexion, knee extension 4/5, ankle dorsi/plantar flexion 4+/5 LLE: hip flexion 4/5, ankle dorsi/plantar flexion 4+/5  Sensation diminished symetrically distal to knees  Skin: Skin is warm and dry.  Left upper extremity with dressing in place appropriately tender.  Left lower extremity surgical dressing in place  Psychiatric: His speech is normal. Judgment normal. Cognition and memory are normal.     Lab Results Last 24 Hours    Results for orders placed or performed during the hospital encounter of 08/15/15 (from the past 24 hour(s))  Creatinine, serum Status: None   Collection Time: 08/18/15 2:25 PM  Result Value Ref Range   Creatinine, Ser 0.98 0.61 - 1.24 mg/dL   GFR calc non Af Amer >60 >60 mL/min   GFR calc Af Amer >60 >60 mL/min      Imaging Results (Last 48 hours)    Dg Tibia/fibula Left Port  08/17/2015 CLINICAL DATA: Known tibial fracture following fixation EXAM: PORTABLE LEFT TIBIA AND FIBULA - 2 VIEW COMPARISON: 08/15/2015 FINDINGS: Knee replacement is again identified. There is now a fixation sideplate and multiple fixation screws traversing the distal tibial fracture. The fracture fragments are in near anatomic alignment. Proximal fibular neck fracture is noted as well. IMPRESSION: Status post ORIF of distal tibial fracture. Electronically Signed By: Alcide Clever M.D. On: 08/17/2015 10:30      Assessment/Plan: Diagnosis: Left tibial fracture after fall Labs and images independently reviewed. Records reviewed and summated above.  1. Does the need for close, 24 hr/day medical supervision in concert with the patient's rehab needs make it unreasonable for this patient to be served in a less intensive setting? Yes  2. Co-Morbidities requiring supervision/potential complications: post-op pain (Biofeedback training with therapies to help reduce reliance on opiate pain medications, monitor pain control during therapies, and sedation at rest and titrate to maximum efficacy to ensure participation and gains in therapies), HTN (monitor and provide prns in accordance with increased physical exertion and pain), paroxysmal SVT (Cont meds), chronic diastolic congestive heart failure, chronic left wrist osteomyelitis (cont meds and dressing), right first and second toe amputations, ABLA (transfuse if necessary to ensure appropriate perfusion for increased activity tolerance) 3. Due to safety, skin/wound care, pain management and patient education, does the patient require 24 hr/day rehab nursing? Yes 4. Does the patient require coordinated care of a physician, rehab nurse, PT (1-2 hrs/day, 5 days/week) and OT (14-2 hrs/day, 5 days/week) to address physical and functional deficits in the context of the above medical diagnosis(es)? Yes Addressing deficits in the following areas: balance, endurance, locomotion, strength, transferring, bathing, dressing, toileting and psychosocial support 5.  Can the patient actively participate in an intensive therapy program of at least 3 hrs of therapy per day at least 5 days per week? Yes 6. The potential for patient to make measurable gains while on inpatient rehab is excellent 7. Anticipated functional outcomes upon discharge from inpatient rehab are min assist with PT, min assist with OT, n/a with SLP. 8. Estimated rehab length of stay to reach the above  functional goals is: 17-21 days. 9. Does the patient have adequate social supports and living environment to accommodate these discharge functional goals? Yes 10. Anticipated D/C setting: Home 11. Anticipated post D/C treatments: HH therapy and Home excercise program 12. Overall Rehab/Functional Prognosis: good  RECOMMENDATIONS: This patient's condition is appropriate for continued rehabilitative care in the following setting: CIR once pt demonstrates ability to tolerate therapy off of IV pain meds. Patient has agreed to participate in recommended program. Yes Note that insurance prior authorization may be required for reimbursement for recommended care.  Comment: Rehab Admissions Coordinator to follow up.  Maryla Morrow, MD 08/19/2015       Revision History     Date/Time User Provider Type Action   08/19/2015 11:15 AM [name], MD Physician Sign   08/19/2015 7:12 AM Charlton Amor, PA-C Physician Assistant Pend   View Details Report       Routing History     Date/Time From To Method   08/19/2015 11:15 AM [name], MD [name], MD In [facility]   08/19/2015 11:15 AM [name], MD [name], MD Fax

## 2015-08-21 NOTE — Progress Notes (Signed)
Pt complaining of pain in LLE and LUE since 1030, pt receiving scheduled oxycodone IR  q6hr, and scheduled MS Contin  q12hr, at 1030 encouraged pt to keep leg elevated, (takes leg off pillows frequently),  Pt up to chair now, and continues to c/o of pain and would like doctor called to see if he could have other med to help with pain. Pt rates pain 7/10, sharp to LUE AND LLE.   Call placed to Dr Magnus Ivan and spoke with him at 1234, will allow pt to have one time dose of Dilaudid  po.  Also informed Dr Magnus Ivan of pt's wife stating that the pt's doctor that prescribed the cymbalta and restoril at home, may be causing his hallucinations, okay to stop those two meds. Pt did not get cymbalta today or yesterday, but did receive restoril last night, and pt did have hallucinations last night.  Dr Magnus Ivan in agreement to discontinue these meds and to let rehab know pt not to take these meds.

## 2015-08-21 NOTE — Progress Notes (Signed)
Subjective: 4 Days Post-Op Procedure(s) (LRB): OPEN REDUCTION INTERNAL FIXATION (ORIF) TIBIA FRACTURE (Left) Patient reports pain as mild.  Concerns over discharge planning.   Objective: Vital signs in last 24 hours: Temp:  [97.6 F (36.4 C)-98.1 F (36.7 C)] 98.1 F (36.7 C) (01/25 0556) Pulse Rate:  [79-90] 85 (01/25 0556) Resp:  [18-20] 18 (01/25 0556) BP: (98-118)/(62-85) 107/85 mmHg (01/25 0556) SpO2:  [96 %-100 %] 99 % (01/25 0556)  Intake/Output from previous day: 01/24 0701 - 01/25 0700 In: 370 [P.O.:360; I.V.:10] Out: 2350 [Urine:2350] Intake/Output this shift:     Recent Labs  08/20/15 0515  HGB 9.9*    Recent Labs  08/20/15 0515  WBC 6.9  RBC 3.62*  HCT 31.0*  PLT 218    Recent Labs  08/18/15 1425  CREATININE 0.98   No results for input(s): LABPT, INR in the last 72 hours.  Right lower extremity:  Neurovascular intact Dorsiflexion/Plantar flexion intact Incision: dressing C/D/I Compartment soft  Assessment/Plan: 4 Days Post-Op Procedure(s) (LRB): OPEN REDUCTION INTERNAL FIXATION (ORIF) TIBIA FRACTURE (Left) Up with therapy  Social work to work on discharge planning Non weight bearing right lower extremity  [name] 08/21/2015, 9:27 AM

## 2015-08-21 NOTE — Progress Notes (Signed)
Patient ID: [name], male   DOB: 06-Nov-1955, 60 y.o.   MRN: [id number] [name] was denied Inpatient Rehab and does not want SNF placement.  Will need to arrange for home health PT and nursing then and discharge to home today or tomorrow.

## 2015-08-21 NOTE — Interval H&P Note (Signed)
[name] was admitted today to Inpatient Rehabilitation with the diagnosis of left distal third comminuted tibia shaft fracture s/p fall.  The patient's history has been reviewed, patient examined, and there is no change in status.  Patient continues to be appropriate for intensive inpatient rehabilitation.  I have reviewed the patient's chart and labs.  Questions were answered to the patient's satisfaction. The PAPE has been reviewed and assessment remains appropriate.  [name] 08/21/2015, 3:39 PM

## 2015-08-21 NOTE — Progress Notes (Addendum)
Rehab admissions - There is a possibility that I will have an inpatient rehab bed open up later today.  I have spoken with patient about it and he really would like inpatient rehab admission.  I will inform all once I know if I can get a bed later today.  Call me for questions.  #[phone]  I now have a bed available on inpatient rehab.  Dr. Magnus Ivan has given [place] an okay for discharge to inpatient rehab.  Will admit to inpatient rehab later today.  #[phone]

## 2015-08-21 NOTE — Progress Notes (Signed)
Patient information reviewed and entered into eRehab system by Jennette Leask, RN, CRRN, PPS Coordinator.  Information including medical coding and functional independence measure will be reviewed and updated through discharge.    

## 2015-08-21 NOTE — Progress Notes (Signed)
Trish Mage, RN Rehab Admission Coordinator Signed Physical Medicine and Rehabilitation PMR Pre-admission 08/21/2015 10:09 AM  Related encounter: ED to Hosp-Admission (Current) from 08/15/2015 in [facility]    Expand All Collapse All   PMR Admission Coordinator Pre-Admission Assessment  Patient: [name] is an 60 y.o., male MRN: [id number] DOB: 1955-10-15 Height: 6\' 3"  (190.5 cm) Weight: 100.6 kg (221 lb 12.5 oz)  Insurance Information HMO: PPO: PCP: IPA: 80/20: OTHER:  PRIMARY: Medicaid Corn access Policy#: [id number] Subscriber: Mikell Christy CM Name: Phone#: Fax#:  Pre-Cert#: Employer: Not employed Benefits: Phone #: [phone] Name: Automated Eff. Date: Eligible 08/21/15 with coverage code [facility] Deduct: Out of Pocket Max: Life Max:  CIR: SNF:  Outpatient: Co-Pay:  Home Health: Co-Pay:  DME: Co-Pay:  Providers:   Emergency Contact Information Contact Information    Name Relation Home Work Mobile   Partington,[name] Spouse   [phone]     Current Medical History  Patient Admitting Diagnosis: Left tibial fracture after fall  History of Present Illness: A 60 y.o. right handed male with history of hypertension, SVT maintained on Eliquis , chronic diastolic congestive heart failure, chronic pain management maintained on MS Contin, chronic left wrist osteomyelitis/MRSA with recent I&D maintained on vancomycin at home followed by 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 Lives with wife and family. Independent with a cane prior to admission. Two-level home with bedroom upstairs. Patient well known to inpatient rehabilitation services October 2016 for  sepsis/multi-medical with multiple irrigation and debridements rectus flap procedure. Presented 08/15/2015 after a fall down some stairs when he tripped. Denied loss of consciousness. X-rays and imaging revealed left distal third comminuted tibia shaft fracture. Underwent ORIF 08/17/2015 per Dr. Magnus Ivan. Nonweightbearing left lower extremity 6 weeks and weightbearing through left elbow only for history of left wrist injury/osteomyelitis. Hospital course pain management. Most recent x-rays of left wrist shows diffuse marrow edema throughout the carpus, distal radius distal old. Cortical erosion of the ulnar aspect of distal radius/all. Overall changes similar to appearance from MRI 07/05/2015. Patient currently continues on intravenous vancomycin for recent I&D of left wrist question duration. Physical therapy evaluation completed with recommendations of physical medicine rehabilitation consult. Patient to be admitted for a comprehensive inpatient rehabilitation program.   Past Medical History  Past Medical History  Diagnosis Date  . Hypertension   . Chronic knee pain     "both sides"  . Hyperlipemia   . PUD (peptic ulcer disease)   . Peripheral vascular disease (HCC)   . Anxiety   . GERD (gastroesophageal reflux disease)   . 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 Atrial fibrillation (HCC) dx'd 04/2015  . Chronic bronchitis (HCC)     "used to get it q yr; nothing in the last ~ 5 yrs" (08/15/2015)  . Migraine     "q 3 months" (08/15/2015)  . Osteoarthritis of both knees   . Depression     Family History  family history includes Heart attack (age of onset: 41) in his paternal grandmother; Heart disease (age of onset: 67) in his mother; Stroke in his maternal grandfather and paternal grandfather.  Prior Rehab/Hospitalizations: Was discharged home from inpatient rehab on 05/25/15 with [facility]  following for RN and wound care follow-up.  Has the patient had major surgery during 100 days prior to admission? Yes. All surgeries have been during acute hospitalizations with I & D left wrist surgery times  two most recently.  Current Medications   Current facility-administered medications:  . albuterol (PROVENTIL) (2.5 MG/3ML) 0.083% nebulizer solution 2.5 mg, 2.5 mg, Nebulization, Q6H PRN, Kathryne Hitch, MD . apixaban Everlene Balls) tablet 5 mg, 5 mg, Oral, BID, Kathryne Hitch, MD, 5 mg at 08/21/15 0943 . atorvastatin (LIPITOR) tablet 80 mg, 80 mg, Oral, Daily, Kathryne Hitch, MD, 80 mg at 08/21/15 0942 . diphenhydrAMINE (BENADRYL) injection 25 mg, 25 mg, Intravenous, Q6H PRN, Kathryne Hitch, MD . docusate sodium (COLACE) capsule 100 mg, 100 mg, Oral, BID, Kathryne Hitch, MD, 100 mg at 08/21/15 0942 . DULoxetine (CYMBALTA) DR capsule 60 mg, 60 mg, Oral, Q1200, Kathryne Hitch, MD, 60 mg at 08/19/15 1336 . flecainide (TAMBOCOR) tablet 100 mg, 100 mg, Oral, Q12H, Kathryne Hitch, MD, 100 mg at 08/21/15 1030 . gabapentin (NEURONTIN) capsule 100 mg, 100 mg, Oral, TID, Kathryne Hitch, MD, 100 mg at 08/21/15 0942 . lactated ringers infusion, , Intravenous, Continuous, Sharee Holster, MD, Last Rate: 10 mL/hr at 08/18/15 0000 . methocarbamol (ROBAXIN) tablet 500 mg, 500 mg, Oral, Q6H PRN, Kathryne Hitch, MD, 500 mg at 08/19/15 0958 . metoprolol tartrate (LOPRESSOR) tablet 75 mg, 75 mg, Oral, BID, Kathryne Hitch, MD, 75 mg at 08/20/15 2106 . morphine (MS CONTIN) 12 hr tablet 90 mg, 90 mg, Oral, Q12H, Kathryne Hitch, MD, 90 mg at 08/21/15 [phone] . multivitamin with minerals tablet 1 tablet, 1 tablet, Oral, Daily, Kathryne Hitch, MD, 1 tablet at 08/21/15 [phone] . ondansetron (ZOFRAN) injection 4 mg, 4 mg, Intravenous, Q6H PRN, Kathryne Hitch, MD . oxyCODONE (Oxy IR/ROXICODONE) immediate  release tablet 30 mg, 30 mg, Oral, Q6H, Kathryne Hitch, MD, 30 mg at 08/21/15 4782 . polyethylene glycol (MIRALAX / GLYCOLAX) packet 17 g, 17 g, Oral, Daily, Kathryne Hitch, MD, 17 g at 08/21/15 0945 . sodium chloride 0.9 % injection 10-40 mL, 10-40 mL, Intracatheter, PRN, Kathryne Hitch, MD, 10 mL at 08/20/15 2210 . temazepam (RESTORIL) capsule 30 mg, 30 mg, Oral, QHS, Kathryne Hitch, MD, 30 mg at 08/20/15 2105 . vancomycin (VANCOCIN) 1,500 mg in sodium chloride 0.9 % 250 mL IVPB, 1,500 mg, Intravenous, Q12H, Stevphen Rochester, RPH, 1,500 mg at 08/21/15 9562 . venlafaxine XR (EFFEXOR-XR) 24 hr capsule 75 mg, 75 mg, Oral, Q breakfast, Kathryne Hitch, MD, 75 mg at 08/21/15 1308  Patients Current Diet: Diet regular Room service appropriate?: Yes; Fluid consistency:: Thin  Precautions / Restrictions Precautions Precautions: Fall Precaution Comments: difficulty with rise onto R LE Restrictions Weight Bearing Restrictions: Yes LUE Weight Bearing: Weight bear through elbow only LLE Weight Bearing: Non weight bearing   Has the patient had 2 or more falls or a fall with injury in the past year?Yes. Patient reports three falls in the past year with left wrist ulna fracture and most recently left tibia fracture.  Prior Activity Level Community (5-7x/wk): Went out 3-4 X a week. Was not driving. Wife drives. Visited with mom and dad on outings.  Home Assistive Devices / Equipment Home Assistive Devices/Equipment: Wheelchair, Medical laboratory scientific officer (specify quad or straight), Walker (specify type), Bedside commode/3-in-1, Shower chair with back, Grab bars in shower, Grab bars around toilet, Hand-held shower hose, Eyeglasses Home Equipment: Toilet riser, Grab bars - tub/shower, Hand held shower head, Wheelchair - manual, Tub bench (platform RW)  Prior Device Use: Indicate devices/aids used by the patient prior to current illness, exacerbation or injury? Manual wheelchair,  Platform rolling Walker and cane.  Prior Functional Level Prior Function Level of Independence: Needs assistance Gait / Transfers Assistance Needed: Using wheelchair for most mobility, occassionally ambulated with platform RW ADL's / Homemaking Assistance Needed: Assistance with LB dressing, sons complete cooking/cleaning Comments: Used to love playing golf and basketball; likes to watch sports on TV. Is now on disability, former Chiropractor  Self Care: Did the patient need help bathing, dressing, using the toilet or eating? Independent  Indoor Mobility: Did the patient need assistance with walking from room to room (with or without device)? Independent.   Stairs: Did the patient need assistance with internal or external stairs (with or without device)? Independent. Used a cane to assist going up and down stairs by himself.  Functional Cognition: Did the patient need help planning regular tasks such as shopping or remembering to take medications? Independent. Wife does report that his memory is impaired.  Current Functional Level Cognition  Overall Cognitive Status: Within Functional Limits for tasks assessed Orientation Level: Oriented X4   Extremity Assessment (includes Sensation/Coordination)  Upper Extremity Assessment: LUE deficits/detail LUE Deficits / Details: STRENGTH: elbow, shoulder 4/5, ROM: wfl LUE: Unable to fully assess due to immobilization LUE Coordination: decreased fine motor  Lower Extremity Assessment: Generalized weakness, LLE deficits/detail RLE Deficits / Details: grossly decreased strength, hx of peripheral neuropathy RLE Sensation: history of peripheral neuropathy LLE Deficits / Details: grossly decreased strength, limited by pain, pt resting in hip ER (added pillows and blankets to keep leg in neutral) LLE: Unable to fully assess due to pain, Unable to fully assess due to immobilization LLE Sensation: history of peripheral neuropathy     ADLs  Overall ADL's : Needs assistance/impaired Grooming: Wash/dry hands, Wash/dry face, Oral care, Set up, Sitting Upper Body Bathing: Set up, Sitting Lower Body Bathing: Set up, Sitting/lateral leans Upper Body Dressing : Set up, Sitting Lower Body Dressing: Minimal assistance, Sitting/lateral leans Lower Body Dressing Details (indicate cue type and reason): able to cross R ankle over L knee (prohibited from doing this with L ankle). Min assist to fully reach feet Functional mobility during ADLs: Maximal assistance, +2 for physical assistance General ADL Comments: Unable to achieve full standing position to attempt transfers. Completed ADLs sitting EOB and reviewed pain management strategies (elevating, maintaining neutral hip position, and icing LLE)    Mobility  Overal bed mobility: Modified Independent Bed Mobility: Supine to Sit Supine to sit: Modified independent (Device/Increase time) Sit to supine: Min guard General bed mobility comments: increased time needed     Transfers  Overall transfer level: Needs assistance Equipment used: Left platform walker Transfers: Sit to/from Stand Sit to Stand: +2 physical assistance, Mod assist Stand pivot transfers: +2 physical assistance, Mod assist Squat pivot transfers: +2 physical assistance, Mod assist General transfer comment: vc for hand/foot placement and technique with assist to power up into standing; assist for pivot to chair with vc for sequencing; pt able to maintain NWB throughout pivot transfer and improved ability with sit to stand    Ambulation / Gait / Stairs / Wheelchair Mobility  Ambulation/Gait General Gait Details: Unable    Posture / Balance Balance Overall balance assessment: Needs assistance Sitting-balance support: Feet supported Sitting balance-Leahy Scale: Good Standing balance support: Bilateral upper extremity supported Standing balance-Leahy Scale: Zero    Special needs/care  consideration BiPAP/CPAP No CPM No Continuous Drip IV KVO Dialysis No  Life Vest No Oxygen No Special Bed No Trach Size No Wound Vac (area) No  Skin Has new surgical wound left lower leg  with dressing. Bruising and redness noted on left lower leg. Has left wrist ace and splint reporting that he had I & D left wrist X 2 two weeks ago. Has healing right great toe and second toe amputation sites.   Bowel mgmt: Last BM last night 08/20/15 Bladder mgmt: Voiding WDL in urinal Diabetic mgmt No    Previous Home Environment Living Arrangements: Spouse/significant other, Children (2 sons) Available Help at Discharge: Family, Available 24 hours/day Type of Home: House Home Layout: Two level, 1/2 bath on main level, Bed/bath upstairs Alternate Level Stairs-Rails: Right, Left, Can reach both Alternate Level Stairs-Number of Steps: Flight Home Access: Stairs to enter Entrance Stairs-Rails: Right, Left, Can reach both Entrance Stairs-Number of Steps: 5 through garage; 4 through front Bathroom Shower/Tub: Tub/shower unit, Engineer, building services: Standard Bathroom Accessibility: Yes How Accessible: Accessible via wheelchair, Accessible via walker Home Care Services: Yes Type of Home Care Services: Home RN ("wound and PICC line care") Home Care Agency (if known): [facility] Additional Comments: Apparently had a rehab stay last year in October with successful dc home  Discharge Living Setting Plans for Discharge Living Setting: Patient's home, House, Lives with (comment) (Lives with wife and 2 sons.) Type of Home at Discharge: House Discharge Home Layout: Two level, 1/2 bath on main level, Bed/bath upstairs Alternate Level Stairs-Number of Steps: 16 steps Discharge Home Access: Stairs to enter, Ramped entrance Entrance Stairs-Number of Steps: 3 steps at front with a ramp; 5 steps at garage entry. Does the patient have any problems obtaining your  medications?: No  Social/Family/Support Systems Patient Roles: Spouse, Parent (Has a wife, 25 yo and 1 yo sons.) Contact Information: Tauheed Mcfayden - wife Anticipated Caregiver: wife Anticipated Caregiver's Contact Information: Babette Relic - wife [phone] Ability/Limitations of Caregiver: Wife does not work. Wife has COPD, occasionally uses a cane and gets SOB. One son works. Another son is in the home and has not been working for the past 3 weeks. Caregiver Availability: 24/7 Discharge Plan Discussed with Primary Caregiver: Yes Is Caregiver In Agreement with Plan?: Yes Does Caregiver/Family have Issues with Lodging/Transportation while Pt is in Rehab?: No  Goals/Additional Needs Patient/Family Goal for Rehab: PT/OT min assist goals Expected length of stay: 17-21 days Cultural Considerations: None Dietary Needs: Regular diet, thin liquids Equipment Needs: TBD Pt/Family Agrees to Admission and willing to participate: Yes Program Orientation Provided & Reviewed with Pt/Caregiver Including Roles & Responsibilities: Yes  Decrease burden of Care through IP rehab admission: N/A  Possible need for SNF placement upon discharge: No. Patient refuses to go to a SNF and plans to go home with wife and sons.  Patient Condition: This patient's medical and functional status has changed since the consult dated: 08/19/15 in which the Rehabilitation Physician determined and documented that the patient's condition is appropriate for intensive rehabilitative care in an inpatient rehabilitation facility. See "History of Present Illness" (above) for medical update. Functional changes are: Currently requiring mod assist +2 for stand pivot transfers. Patient's medical and functional status update has been discussed with the Rehabilitation physician and patient remains appropriate for inpatient rehabilitation. Will admit to inpatient rehab today.  Preadmission Screen Completed By: Trish Mage, 08/21/2015  10:31 AM ______________________________________________________________________  Discussed status with Dr. Allena Katz on 08/21/15 at 1030 and received telephone approval for admission today.  Admission Coordinator: Trish Mage, time1030/Date01/25/17          Cosigned by: [name], MD at 08/21/2015 10:43 AM  Revision History  Date/Time User Provider Type Action   08/21/2015 10:43 AM [name], MD Physician Cosign   08/21/2015 10:31 AM Trish Mage, RN Rehab Admission Coordinator Sign

## 2015-08-21 NOTE — Progress Notes (Signed)
[facility]  Patient Status: Active pt with [facility] prior to this admission  [facility] is providing the following services: HHRN and [facility] for home IV ABX.  [facility] hospital team will continue to follow Mr. Perren while an inpatient to support home care needs upon DC.  If patient discharges after hours, please call [phone].   Sedalia Muta 08/21/2015, 5:18 PM

## 2015-08-21 NOTE — H&P (View-Only) (Signed)
  Physical Medicine and Rehabilitation Admission H&P    Chief Complaint  Patient presents with  . Leg Pain  . Fall  : HPI: [name] is a 59 y.o. right handed male with history of hypertension, SVT maintained on Eliquis , chronic diastolic congestive heart failure, chronic pain management maintained on MS Contin, chronic left wrist osteomyelitis/MRSA with recent I&D maintained on vancomycin at home followed by Dr. 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 Lives with wife and family. Independent with a cane prior to admission. Two-level home with bedroom upstairs. Patient well known to inpatient rehabilitation services October 2016 for sepsis/multi-medical with multiple irrigation and debridements rectus flap procedure. Presented 08/15/2015 after a fall down some stairs when he tripped. Denied loss of consciousness. X-rays and imaging revealed left distal third comminuted tibia shaft fracture. Underwent ORIF 08/17/2015 per Dr. Blackman. Nonweightbearing left lower extremity 6 weeks and weightbearing through left elbow only for history of left wrist injury/osteomyelitis. Hospital course pain management. Most recent x-rays of left wrist shows diffuse marrow edema throughout the carpus, distal radius distal old. Cortical erosion of the ulnar aspect of distal radius/all. Overall changes similar to appearance from MRI 07/05/2015. Patient currently continues on intravenous vancomycin for recent I&D of left wrist question duration. Physical therapy evaluation completed with recommendations of physical medicine rehabilitation consult. Patient was admitted for a comprehensive rehabilitation program  ROS Constitutional: Negative for fever, chills and malaise/fatigue.  HENT: Negative for hearing loss.  Eyes: Negative for blurred vision and double vision.  Respiratory: Negative for cough and shortness of breath.  Cardiovascular: Positive for leg swelling. Negative for chest pain.    Gastrointestinal: Positive for constipation. Negative for nausea and vomiting.   GERD  Genitourinary: Negative for dysuria and hematuria.  Musculoskeletal: Positive for falls.  Skin: Negative for rash.  Neurological: Positive for headaches. Negative for seizures.  Psychiatric/Behavioral: Positive for depression.   Anxiety  All other systems reviewed and are negative   Past Medical History  Diagnosis Date  . Hypertension   . Chronic knee pain     "both sides"  . Hyperlipemia   . PUD (peptic ulcer disease)   . Peripheral vascular disease (HCC)   . Anxiety   . GERD (gastroesophageal reflux disease)   . 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 Atrial fibrillation (HCC) dx'd 04/2015  . Chronic bronchitis (HCC)     "used to get it q yr; nothing in the last ~ 5 yrs" (08/15/2015)  . Migraine     "q 3 months" (08/15/2015)  . Osteoarthritis of both knees   . Depression    Past Surgical History  Procedure Laterality Date  . I&d extremity Left 07/12/2013    Procedure: I&D Left Thigh Abscess;  Surgeon: John [name], MD;  Location: [facility] OR;  Service: Orthopedics;  Laterality: Left;  . Replacement total knee bilateral Bilateral ~ 2007-2013"    "left-right"  . Shoulder arthroscopy w/ rotator cuff repair Bilateral 1992-1993    "right-left"  . Elbow arthroscopy with tendon reconstruction Right 1990  . 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 Incision and drainage abscess Right 04/11/2015    Procedure: INCISION AND DRAINAGE OF RIGHT THIGH ABSCESS;  Surgeon: Brian L Chen, MD;  Location: [facility];  Service: Vascular;  Laterality: Right;  .   Incision and drainage abscess Right 04/13/2015    Procedure: INCISION AND DRAINAGE THIGH ABSCESS;  Surgeon: Brian L Chen, MD;  Location: [facility];   Service: Vascular;  Laterality: Right;  . I&d extremity Left 04/17/2015    Procedure: IRRIGATION AND DEBRIDEMENT LEFT EXTREMITY;  Surgeon: Brian L Chen, MD;  Location: [facility];  Service: Vascular;  Laterality: Left;  . Muscle flap closure Right 04/19/2015    Procedure: RIGHT RECTUS ABDOMINUS  FLAP TO RIGHT GROIN;  Surgeon: Brinda Thimmappa, MD;  Location: [facility];  Service: Plastics;  Laterality: Right;  . Femoral revision Right 04/19/2015    Procedure: OVERSEW RIGHT FEMORAL VEIN ;  Surgeon: Christopher S Dickson, MD;  Location: [facility];  Service: Vascular;  Laterality: Right;  . Wound exploration Right 04/23/2015    Procedure: EXAM UNDER ANESTHESIA AND VAC CHANGE RIGHT THIGH;  Surgeon: Brinda Thimmappa, MD;  Location: [facility];  Service: Plastics;  Laterality: Right;  . Amputation Right 05/17/2015    Procedure: Right 1st and 2nd Toe Amputation;  Surgeon: Marcus Duda V, MD;  Location: [facility];  Service: Orthopedics;  Laterality: Right;  . I&d extremity Left 07/09/2015    Procedure: IRRIGATION AND DEBRIDEMENT LEFT WRIST;  Surgeon: Harrill Coley, MD;  Location: [facility];  Service: Plastics;  Laterality: Left;  . I&d extremity Left 08/08/2015    Procedure: IRRIGATION AND DEBRIDEMENT EXTREMITY;  Surgeon: Harrill Coley, MD;  Location: [facility];  Service: Plastics;  Laterality: Left;  . Tonsillectomy  ~ 1972  . Appendectomy  ~ 1980  . Cholecystectomy  ~ 1982  . Inguinal hernia repair Right ~ 1969  . Hemorrhoidectomy with hemorrhoid banding  ~ 1985  . Knee arthroscopy Bilateral 1985-2008    "left-right"  . Joint replacement    . Uvulopalatoplasty  1991  . Cardiac catheterization  1993    "Dr. Jordan; for vasospasms"  . Orif tibia fracture Left 08/17/2015    Procedure: OPEN REDUCTION INTERNAL FIXATION (ORIF) TIBIA FRACTURE;  Surgeon: Christopher Y Blackman, MD;  Location: [facility];  Service: Orthopedics;  Laterality: Left;   Family History  Problem Relation Age of Onset  . Heart attack Paternal Grandmother 55  . Heart  disease Mother 70    arrythmia  . Stroke Paternal Grandfather   . Stroke Maternal Grandfather    Social History:  reports that he has quit smoking. His smoking use included Cigarettes. He has a 3 pack-year smoking history. He has never used smokeless tobacco. He reports that he does not drink alcohol or use illicit drugs. Allergies:  Allergies  Allergen Reactions  . Verapamil Other (See Comments)    Causes pvc's  . Imitrex [Sumatriptan] Other (See Comments)    Chest pain, 2004 (tolerates 50 mg prn)   Medications Prior to Admission  Medication Sig Dispense Refill  . albuterol (PROAIR HFA) 108 (90 BASE) MCG/ACT inhaler Inhale 1 puff into the lungs every 6 (six) hours as needed for wheezing or shortness of breath.     . apixaban (ELIQUIS) 5 MG TABS tablet Take 1 tablet (5 mg total) by mouth 2 (two) times daily. 60 tablet 0  . atorvastatin (LIPITOR) 80 MG tablet Take 1 tablet (80 mg total) by mouth daily. 30 tablet 1  . docusate sodium (COLACE) 100 MG capsule Take 1 capsule (100 mg total) by mouth 2 (two) times daily. 10 capsule 0  . DULoxetine (CYMBALTA) 30 MG capsule Take 60 mg by mouth daily at 12 noon.  0  . flecainide (TAMBOCOR) 100 MG   tablet Take 1 tablet (100 mg total) by mouth every 12 (twelve) hours. 60 tablet 1  . gabapentin (NEURONTIN) 100 MG capsule Take 1 capsule (100 mg total) by mouth 3 (three) times daily. 90 capsule 1  . Metoprolol Tartrate 75 MG TABS Take 75 mg by mouth 2 (two) times daily. 60 tablet 1  . morphine (MS CONTIN) 30 MG 12 hr tablet Take 3 tablets (90 mg total) by mouth every 12 (twelve) hours. 7 tablet 0  . Multiple Vitamin (MULTIVITAMIN WITH MINERALS) TABS tablet Take 1 tablet by mouth daily.    . naloxegol oxalate (MOVANTIK) 25 MG TABS tablet Take 1 tablet (25 mg total) by mouth daily before breakfast. 30 tablet 0  . oxycodone (ROXICODONE) 30 MG immediate release tablet Take 1 tablet (30 mg total) by mouth every 6 (six) hours. scheduled 13 tablet 0  .  polyethylene glycol (MIRALAX / GLYCOLAX) packet Take 17 g by mouth daily. 100 each 0  . temazepam (RESTORIL) 15 MG capsule Take 30 mg by mouth at bedtime.   0  . testosterone cypionate (DEPOTESTOSTERONE CYPIONATE) 200 MG/ML injection Inject 100 mg into the muscle every 14 (fourteen) days. Reported on 08/05/2015  0  . vancomycin 1,250 mg in sodium chloride 0.9 % 250 mL Inject 1,250 mg into the vein every 12 (twelve) hours. 60 Syringe 0  . venlafaxine XR (EFFEXOR-XR) 75 MG 24 hr capsule Take 1 capsule (75 mg total) by mouth daily with breakfast. 30 capsule 1  . Diclofenac-Misoprostol 75-0.2 MG TBEC     . famciclovir (FAMVIR) 500 MG tablet Take 500 mg by mouth 2 (two) times daily as needed (fever blisters).     . SUMAtriptan (IMITREX) 50 MG tablet Take 50 mg by mouth daily as needed for migraine or headache. Maximum 2 doses in 2 days  0  . trifluridine (VIROPTIC) 1 % ophthalmic solution Place 1 drop into both eyes 2 (two) times daily as needed (breakouts (blurry, matted eyelids)).      Home: Home Living Family/patient expects to be discharged to:: Private residence Living Arrangements: Spouse/significant other, Children (2 sons) Available Help at Discharge: Family, Available 24 hours/day Type of Home: House Home Access: Stairs to enter Entrance Stairs-Number of Steps: 5 through garage; 4 through front Entrance Stairs-Rails: Right, Left, Can reach both Home Layout: Two level, 1/2 bath on main level, Bed/bath upstairs Alternate Level Stairs-Number of Steps: Flight Alternate Level Stairs-Rails: Right, Left, Can reach both Bathroom Shower/Tub: Tub/shower unit, Curtain Bathroom Toilet: Standard Bathroom Accessibility: Yes Home Equipment: Toilet riser, Grab bars - tub/shower, Hand held shower head, Wheelchair - manual, Tub bench (platform RW) Additional Comments: Apparently had a rehab stay last year in October with successful dc home   Functional History: Prior Function Level of Independence:  Needs assistance Gait / Transfers Assistance Needed: Using wheelchair for most mobility, occassionally ambulated with platform RW ADL's / Homemaking Assistance Needed: Assistance with LB dressing, sons complete cooking/cleaning Comments: Used to love playing golf and basketball; likes to watch sports on TV.  Is now on disability, former pharmaceutical salesman  Functional Status:  Mobility: Bed Mobility Overal bed mobility: Modified Independent Bed Mobility: Supine to Sit Supine to sit: Modified independent (Device/Increase time) Sit to supine: Min guard General bed mobility comments: increased time needed  Transfers Overall transfer level: Needs assistance Equipment used: Left platform walker Transfers: Sit to/from Stand Sit to Stand: +2 physical assistance, Mod assist Stand pivot transfers: +2 physical assistance, Mod assist Squat pivot transfers: +2 physical assistance,   Mod assist General transfer comment: vc for hand/foot placement and technique with assist to power up into standing; assist for pivot to chair with vc for sequencing; pt able to maintain NWB throughout pivot transfer and improved ability with sit to stand Ambulation/Gait General Gait Details: Unable    ADL: ADL Overall ADL's : Needs assistance/impaired Grooming: Wash/dry hands, Wash/dry face, Oral care, Set up, Sitting Upper Body Bathing: Set up, Sitting Lower Body Bathing: Set up, Sitting/lateral leans Upper Body Dressing : Set up, Sitting Lower Body Dressing: Minimal assistance, Sitting/lateral leans Lower Body Dressing Details (indicate cue type and reason): able to cross R ankle over L knee (prohibited from doing this with L ankle). Min assist to fully reach feet Functional mobility during ADLs: Maximal assistance, +2 for physical assistance General ADL Comments: Unable to achieve full standing position to attempt transfers. Completed ADLs sitting EOB and reviewed pain management strategies (elevating,  maintaining neutral hip position, and icing LLE)  Cognition: Cognition Overall Cognitive Status: Within Functional Limits for tasks assessed Orientation Level: Oriented X4 Cognition Arousal/Alertness: Awake/alert Behavior During Therapy: WFL for tasks assessed/performed Overall Cognitive Status: Within Functional Limits for tasks assessed  Physical Exam: Blood pressure 107/85, pulse 85, temperature 98.1 F (36.7 C), temperature source Oral, resp. rate 18, height 6' 3" (1.905 m), weight 100.6 kg (221 lb 12.5 oz), SpO2 99 %. Physical Exam Constitutional: He is oriented to person, place, and time. He appears well-developed and well-nourished.  HENT:  Head: Normocephalic and atraumatic.  Eyes: Conjunctivae and EOM are normal.  Neck: Normal range of motion. Neck supple. No thyromegaly present.  Cardiovascular: Normal rate and regular rhythm.  Respiratory: Effort normal and breath sounds normal. No respiratory distress.  GI: Soft. Bowel sounds are normal. He exhibits no distension.  Musculoskeletal: He exhibits edema and tenderness.  Neurological: He is alert and oriented to person, place, and time.  Motor: RUE 5/5 proximal to distal LUE 4/5 shoulder abduction, 4-/5 elbow flexion/extension, 4/5 hand grip RLE: hip flexion, knee extension 4+/5, ankle dorsi/plantar flexion 4+/5 LLE: hip flexion 4/5, ankle dorsi/plantar flexion 4+/5  Sensation diminished symetrically distal to knees  Skin: Skin is warm and dry.  Left upper extremity with dressing in place appropriately tender.  Left lower extremity surgical dressing in place  Psychiatric: His speech is normal. Judgment normal. Cognition and memory are normal   Results for orders placed or performed during the hospital encounter of 08/15/15 (from the past 48 hour(s))  CBC     Status: Abnormal   Collection Time: 08/20/15  5:15 AM  Result Value Ref Range   WBC 6.9 4.0 - 10.5 K/uL   RBC 3.62 (L) 4.22 - 5.81 MIL/uL   Hemoglobin 9.9 (L)  13.0 - 17.0 g/dL   HCT 31.0 (L) 39.0 - 52.0 %   MCV 85.6 78.0 - 100.0 fL   MCH 27.3 26.0 - 34.0 pg   MCHC 31.9 30.0 - 36.0 g/dL   RDW 16.6 (H) 11.5 - 15.5 %   Platelets 218 150 - 400 K/uL   No results found.   Medical Problem List and Plan: 1.  Left distal third comminuted tibia shaft fracture secondary to fall. Status post ORIF 08/17/2015. Nonweightbearing 6 weeks 2.  DVT Prophylaxis/Anticoagulation: Eliquis for history of SVT. 3. Pain Management/chronic pain management: MS Contin 90 mg every 12 hours, Neurontin 100 mg 3 times a day, Cymbalta 60 mg daily, Robaxin and oxycodone as needed. 4. Mood: Effexor 75 mg daily. 5. Neuropsych: This patient is capable of   making decisions on his own behalf. 6. Skin/Wound Care: Routine skin checks 7. Fluids/Electrolytes/Nutrition: Routine I&O's with follow-up chemistries 8. Chronic left wrist osteomyelitis/MRSA. Status post recent I&D per Dr.Coley and maintain on intravenous vancomycin prior to admission. Will confirm duration of antibiotics. Weightbearing as tolerated to left elbow only 9. History of SVT. Cardiac rate controlled. Continue Tambocor 100 mg every 12 hours, Lopressor 75 mg twice a day 10. Chronic diastolic congestive heart failure. Monitor for any signs of fluid overload 11. Hyperlipidemia. Lipitor 12. History of toe amputation on right: Cont education with b/l LE deficits  Post Admission Physician Evaluation: 1. Functional deficits secondary to Left distal third comminuted tibia shaft fracture s/p fall.  2. Patient is admitted to receive collaborative, interdisciplinary care between the physiatrist, rehab nursing staff, and therapy team. 3. Patient's level of medical complexity and substantial therapy needs in context of that medical necessity cannot be provided at a lesser intensity of care such as a SNF. 4. Patient has experienced substantial functional loss from his/her baseline which was documented above under the "Functional  History" and "Functional Status" headings.  Judging by the patient's diagnosis, physical exam, and functional history, the patient has potential for functional progress which will result in measurable gains while on inpatient rehab.  These gains will be of substantial and practical use upon discharge  in facilitating mobility and self-care at the household level. 5. Physiatrist will provide 24 hour management of medical needs as well as oversight of the therapy plan/treatment and provide guidance as appropriate regarding the interaction of the two. 6. 24 hour rehab nursing will assist with safety, skin/wound care, pain management and patient education and help integrate therapy concepts, techniques,education, etc. 7. PT will assess and treat for/with: Lower extremity strength, range of motion, stamina, balance, functional mobility, safety, adaptive techniques and equipment, woundcare, coping skills, pain control, education.   Goals are: Min A/Supervision. 8. OT will assess and treat for/with: ADL's, functional mobility, safety, upper extremity strength, adaptive techniques and equipment, wound mgt, ego support, and community reintegration.   Goals are: Min A/Supervision. Therapy may not proceed with showering this patient. 9. Case Management and Social Worker will assess and treat for psychological issues and discharge planning. 10. Team conference will be held weekly to assess progress toward goals and to determine barriers to discharge. 11. Patient will receive at least 3 hours of therapy per day at least 5 days per week. 12. ELOS: 14-18 days.     13. Prognosis:  excellent  Ankit Patel, MD 08/21/2015 

## 2015-08-21 NOTE — Progress Notes (Signed)
Physical Therapy Treatment Patient Details Name: [name] MRN: [id number] DOB: 11/17/1955 Today's Date: 08/21/2015    History of Present Illness Admitted after fall down stairs resulting in L tibial fx, now s/p ORIF, NWB; Pertinent PMH of recent R wrist and metcarpal fxs with osteomyleitis and  multiple surgeries (since Sept), recent thrombosed R femoral aneurysm, R thigh abscess (multiple I&Ds since Sept), rectus abdominus flap to R groin, sepsis, recently diagnosed with CHF, R foot osteomyelitis resulting in partial amputation    PT Comments    Pt continues to be motivated and is progressing toward mobility goals. Current plan remains appropriate.   Follow Up Recommendations  CIR     Equipment Recommendations   (remains to be determined; he is pretty well-equipped)    Recommendations for Other Services OT consult;Rehab consult     Precautions / Restrictions Precautions Precautions: Fall Restrictions Weight Bearing Restrictions: Yes LUE Weight Bearing: Weight bear through elbow only LLE Weight Bearing: Non weight bearing    Mobility  Bed Mobility Overal bed mobility: Modified Independent Bed Mobility: Supine to Sit     Supine to sit: Modified independent (Device/Increase time)     General bed mobility comments: increased time needed   Transfers Overall transfer level: Needs assistance Equipment used: Left platform walker Transfers: Sit to/from Stand Sit to Stand: +2 physical assistance;Min assist Stand pivot transfers: +2 physical assistance;Min assist       General transfer comment: carry over of safe hand placement and technique; maintained NWB status with tactile cues from therapist  Ambulation/Gait Ambulation/Gait assistance: Min assist;+2 safety/equipment Ambulation Distance (Feet): 3 Feet Assistive device: Left platform walker Gait Pattern/deviations:  (hop to pattern)     General Gait Details: assist to guide AD and to maintain balance at times;  compliant with NWB status throughout; fatigued after ambulation of ~3 ft    Stairs            Wheelchair Mobility    Modified Rankin (Stroke Patients Only)       Balance     Sitting balance-Leahy Scale: Good       Standing balance-Leahy Scale: Zero                      Cognition Arousal/Alertness: Awake/alert Behavior During Therapy: WFL for tasks assessed/performed Overall Cognitive Status: Within Functional Limits for tasks assessed                      Exercises      General Comments        Pertinent Vitals/Pain Pain Assessment: 0-10 Pain Score: 6  Pain Location: L LE Pain Descriptors / Indicators: Sore Pain Intervention(s): Limited activity within patient's tolerance;Monitored during session;Premedicated before session;Repositioned    Home Living                      Prior Function            PT Goals (current goals can now be found in the care plan section) Acute Rehab PT Goals Patient Stated Goal: Hopes to be able to get to CIR, get stronger and get better; wants to walk his daughter down the aisle in May PT Goal Formulation: With patient Time For Goal Achievement: 09/01/15 Potential to Achieve Goals: Good Progress towards PT goals: Progressing toward goals    Frequency  Min 5X/week    PT Plan Current plan remains appropriate    Co-evaluation  End of Session Equipment Utilized During Treatment: Gait belt Activity Tolerance: Patient tolerated treatment well Patient left: in chair;with call bell/phone within reach     Time: 1111-1126 PT Time Calculation (min) (ACUTE ONLY): 15 min  Charges:  $Gait Training: 8-22 mins                    G Codes:      Derek Mound, PTA Pager: [phone]   08/21/2015, 1:17 PM

## 2015-08-21 NOTE — PMR Pre-admission (Signed)
PMR Admission Coordinator Pre-Admission Assessment  Patient: [name] is an 60 y.o., male MRN: [id number] DOB: [birth year] Height:  (190.5 cm) Weight: 100.6 kg (221 lb 12.5 oz)              Insurance Information HMO:      PPO:       PCP:       IPA:       80/20:       OTHER:   PRIMARY: Medicaid [place] access      Policy#: [id number]      Subscriber: Domenick Cocker CM Name:        Phone#:       Fax#:   Pre-Cert#:        Employer: Not employed Benefits:  Phone #: [phone]     Name: Automated Eff. Date: Eligible 08/21/15 with coverage code [facility]     Deduct:        Out of Pocket Max:        Life Max:   CIR:        SNF:   Outpatient:       Co-Pay:   Home Health:        Co-Pay:   DME:       Co-Pay:   Providers:    Emergency Contact Information Contact Information    Name Relation Home Work Mobile   Larmon,[name] Spouse   [phone]     Current Medical History  Patient Admitting Diagnosis:  Left tibial fracture after fall  History of Present Illness:  A 60 y.o. right handed male with history of hypertension, SVT maintained on Eliquis , chronic diastolic congestive heart failure, chronic pain management maintained on MS Contin, chronic left wrist osteomyelitis/MRSA with recent I&D maintained on vancomycin at home followed by 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 Lives with wife and family. Independent with a cane prior to admission. Two-level home with bedroom upstairs. Patient well known to inpatient rehabilitation services October 2016 for sepsis/multi-medical with multiple irrigation and debridements rectus flap procedure. Presented 08/15/2015 after a fall down some stairs when he tripped. Denied loss of consciousness. X-rays and imaging revealed left distal third comminuted tibia shaft fracture. Underwent ORIF 08/17/2015 per Dr. Magnus Ivan. Nonweightbearing left lower extremity 6 weeks and weightbearing through left elbow only for history of left wrist  injury/osteomyelitis. Hospital course pain management. Most recent x-rays of left wrist shows diffuse marrow edema throughout the carpus, distal radius distal old. Cortical erosion of the ulnar aspect of distal radius/all. Overall changes similar to appearance from MRI 07/05/2015. Patient currently continues on intravenous vancomycin for recent I&D of left wrist question duration. Physical therapy evaluation completed with recommendations of physical medicine rehabilitation consult. Patient to be admitted for a comprehensive inpatient rehabilitation program.    Past Medical History  Past Medical History  Diagnosis Date  . Hypertension   . Chronic knee pain     "both sides"  . Hyperlipemia   . PUD (peptic ulcer disease)   . Peripheral vascular disease (HCC)   . Anxiety   . GERD (gastroesophageal reflux disease)   . 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 Atrial fibrillation (HCC) dx'd 04/2015  . Chronic bronchitis (HCC)     "used to get it q yr; nothing in the last ~ 5 yrs" (08/15/2015)  . Migraine     "q 3 months" (08/15/2015)  . Osteoarthritis of both knees   .  Depression     Family History  family history includes Heart attack (age of onset: 3) in his paternal grandmother; Heart disease (age of onset: 45) in his mother; Stroke in his maternal grandfather and paternal grandfather.  Prior Rehab/Hospitalizations: Was discharged home from inpatient rehab on 05/25/15 with [facility] following for RN and wound care follow-up.  Has the patient had major surgery during 100 days prior to admission? Yes.  All surgeries have been during acute hospitalizations with I & D left wrist surgery times two most recently.  Current Medications   Current facility-administered medications:  .  albuterol (PROVENTIL) (2.5 MG/3ML) 0.083% nebulizer solution 2.5 mg, 2.5 mg, Nebulization, Q6H PRN, Kathryne Hitch, MD .  apixaban Everlene Balls) tablet 5 mg, 5 mg,  Oral, BID, Kathryne Hitch, MD, 5 mg at 08/21/15 0943 .  atorvastatin (LIPITOR) tablet 80 mg, 80 mg, Oral, Daily, Kathryne Hitch, MD, 80 mg at 08/21/15 0942 .  diphenhydrAMINE (BENADRYL) injection 25 mg, 25 mg, Intravenous, Q6H PRN, Kathryne Hitch, MD .  docusate sodium (COLACE) capsule 100 mg, 100 mg, Oral, BID, Kathryne Hitch, MD, 100 mg at 08/21/15 0942 .  DULoxetine (CYMBALTA) DR capsule 60 mg, 60 mg, Oral, Q1200, Kathryne Hitch, MD, 60 mg at 08/19/15 1336 .  flecainide (TAMBOCOR) tablet 100 mg, 100 mg, Oral, Q12H, Kathryne Hitch, MD, 100 mg at 08/21/15 1030 .  gabapentin (NEURONTIN) capsule 100 mg, 100 mg, Oral, TID, Kathryne Hitch, MD, 100 mg at 08/21/15 0942 .  lactated ringers infusion, , Intravenous, Continuous, Sharee Holster, MD, Last Rate: 10 mL/hr at 08/18/15 0000 .  methocarbamol (ROBAXIN) tablet 500 mg, 500 mg, Oral, Q6H PRN, Kathryne Hitch, MD, 500 mg at 08/19/15 0958 .  metoprolol tartrate (LOPRESSOR) tablet 75 mg, 75 mg, Oral, BID, Kathryne Hitch, MD, 75 mg at 08/20/15 2106 .  morphine (MS CONTIN) 12 hr tablet 90 mg, 90 mg, Oral, Q12H, Kathryne Hitch, MD, 90 mg at 08/21/15 [phone] .  multivitamin with minerals tablet 1 tablet, 1 tablet, Oral, Daily, Kathryne Hitch, MD, 1 tablet at 08/21/15 [phone] .  ondansetron (ZOFRAN) injection 4 mg, 4 mg, Intravenous, Q6H PRN, Kathryne Hitch, MD .  oxyCODONE (Oxy IR/ROXICODONE) immediate release tablet 30 mg, 30 mg, Oral, Q6H, Kathryne Hitch, MD, 30 mg at 08/21/15 5638 .  polyethylene glycol (MIRALAX / GLYCOLAX) packet 17 g, 17 g, Oral, Daily, Kathryne Hitch, MD, 17 g at 08/21/15 0945 .  sodium chloride 0.9 % injection 10-40 mL, 10-40 mL, Intracatheter, PRN, Kathryne Hitch, MD, 10 mL at 08/20/15 2210 .  temazepam (RESTORIL) capsule 30 mg, 30 mg, Oral, QHS, Kathryne Hitch, MD, 30 mg at 08/20/15 2105 .  vancomycin (VANCOCIN)  1,500 mg in sodium chloride 0.9 % 250 mL IVPB, 1,500 mg, Intravenous, Q12H, Stevphen Rochester, RPH, 1,500 mg at 08/21/15 7564 .  venlafaxine XR (EFFEXOR-XR) 24 hr capsule 75 mg, 75 mg, Oral, Q breakfast, Kathryne Hitch, MD, 75 mg at 08/21/15 3329  Patients Current Diet: Diet regular Room service appropriate?: Yes; Fluid consistency:: Thin  Precautions / Restrictions Precautions Precautions: Fall Precaution Comments: difficulty with rise onto R LE Restrictions Weight Bearing Restrictions: Yes LUE Weight Bearing: Weight bear through elbow only LLE Weight Bearing: Non weight bearing   Has the patient had 2 or more falls or a fall with injury in the past year?Yes.  Patient reports three falls in the past year with left wrist ulna fracture and  most recently left tibia fracture.  Prior Activity Level Community (5-7x/wk): Went out 3-4 X a week.  Was not driving.  Wife drives.  Visited with mom and dad on outings.  Home Assistive Devices / Equipment Home Assistive Devices/Equipment: Wheelchair, Medical laboratory scientific officer (specify quad or straight), Walker (specify type), Bedside commode/3-in-1, Shower chair with back, Grab bars in shower, Grab bars around toilet, Hand-held shower hose, Eyeglasses Home Equipment: Toilet riser, Grab bars - tub/shower, Hand held shower head, Wheelchair - manual, Tub bench (platform RW)  Prior Device Use: Indicate devices/aids used by the patient prior to current illness, exacerbation or injury? Manual wheelchair, Platform rolling Walker and cane.  Prior Functional Level Prior Function Level of Independence: Needs assistance Gait / Transfers Assistance Needed: Using wheelchair for most mobility, occassionally ambulated with platform RW ADL's / Homemaking Assistance Needed: Assistance with LB dressing, sons complete cooking/cleaning Comments: Used to love playing golf and basketball; likes to watch sports on TV.  Is now on disability, former Chiropractor  Self Care:  Did the patient need help bathing, dressing, using the toilet or eating?  Independent  Indoor Mobility: Did the patient need assistance with walking from room to room (with or without device)? Independent.    Stairs: Did the patient need assistance with internal or external stairs (with or without device)? Independent.  Used a cane to assist going up and down stairs by himself.  Functional Cognition: Did the patient need help planning regular tasks such as shopping or remembering to take medications? Independent.  Wife does report that his memory is impaired.  Current Functional Level Cognition  Overall Cognitive Status: Within Functional Limits for tasks assessed Orientation Level: Oriented X4    Extremity Assessment (includes Sensation/Coordination)  Upper Extremity Assessment: LUE deficits/detail LUE Deficits / Details: STRENGTH: elbow, shoulder 4/5, ROM: wfl LUE: Unable to fully assess due to immobilization LUE Coordination: decreased fine motor  Lower Extremity Assessment: Generalized weakness, LLE deficits/detail RLE Deficits / Details: grossly decreased strength, hx of peripheral neuropathy RLE Sensation: history of peripheral neuropathy LLE Deficits / Details: grossly decreased strength, limited by pain, pt resting in hip ER (added pillows and blankets to keep leg in neutral) LLE: Unable to fully assess due to pain, Unable to fully assess due to immobilization LLE Sensation: history of peripheral neuropathy    ADLs  Overall ADL's : Needs assistance/impaired Grooming: Wash/dry hands, Wash/dry face, Oral care, Set up, Sitting Upper Body Bathing: Set up, Sitting Lower Body Bathing: Set up, Sitting/lateral leans Upper Body Dressing : Set up, Sitting Lower Body Dressing: Minimal assistance, Sitting/lateral leans Lower Body Dressing Details (indicate cue type and reason): able to cross R ankle over L knee (prohibited from doing this with L ankle). Min assist to fully reach  feet Functional mobility during ADLs: Maximal assistance, +2 for physical assistance General ADL Comments: Unable to achieve full standing position to attempt transfers. Completed ADLs sitting EOB and reviewed pain management strategies (elevating, maintaining neutral hip position, and icing LLE)    Mobility  Overal bed mobility: Modified Independent Bed Mobility: Supine to Sit Supine to sit: Modified independent (Device/Increase time) Sit to supine: Min guard General bed mobility comments: increased time needed     Transfers  Overall transfer level: Needs assistance Equipment used: Left platform walker Transfers: Sit to/from Stand Sit to Stand: +2 physical assistance, Mod assist Stand pivot transfers: +2 physical assistance, Mod assist Squat pivot transfers: +2 physical assistance, Mod assist General transfer comment: vc for hand/foot placement and technique with assist  to power up into standing; assist for pivot to chair with vc for sequencing; pt able to maintain NWB throughout pivot transfer and improved ability with sit to stand    Ambulation / Gait / Stairs / Wheelchair Mobility  Ambulation/Gait General Gait Details: Unable    Posture / Balance Balance Overall balance assessment: Needs assistance Sitting-balance support: Feet supported Sitting balance-Leahy Scale: Good Standing balance support: Bilateral upper extremity supported Standing balance-Leahy Scale: Zero    Special needs/care consideration BiPAP/CPAP No CPM No Continuous Drip IV KVO Dialysis No     Life Vest No Oxygen No Special Bed No Trach Size No Wound Vac (area) No     Skin Has new surgical wound left lower leg with dressing.  Bruising and redness noted on left lower leg.  Has left wrist ace and splint reporting that he had I & D left wrist X 2 two weeks ago.  Has healing right great toe and second toe amputation sites.                               Bowel mgmt: Last BM last night 08/20/15 Bladder mgmt:  Voiding WDL in urinal Diabetic mgmt No    Previous Home Environment Living Arrangements: Spouse/significant other, Children (2 sons) Available Help at Discharge: Family, Available 24 hours/day Type of Home: House Home Layout: Two level, 1/2 bath on main level, Bed/bath upstairs Alternate Level Stairs-Rails: Right, Left, Can reach both Alternate Level Stairs-Number of Steps: Flight Home Access: Stairs to enter Entrance Stairs-Rails: Right, Left, Can reach both Entrance Stairs-Number of Steps: 5 through garage; 4 through front Bathroom Shower/Tub: Tub/shower unit, Engineer, building services: Standard Bathroom Accessibility: Yes How Accessible: Accessible via wheelchair, Accessible via walker Home Care Services: Yes Type of Home Care Services: Home RN ("wound and PICC line care") Home Care Agency (if known): [facility] Additional Comments: Apparently had a rehab stay last year in October with successful dc home  Discharge Living Setting Plans for Discharge Living Setting: Patient's home, House, Lives with (comment) (Lives with wife and 2 sons.) Type of Home at Discharge: House Discharge Home Layout: Two level, 1/2 bath on main level, Bed/bath upstairs Alternate Level Stairs-Number of Steps: 16 steps Discharge Home Access: Stairs to enter, Ramped entrance Entrance Stairs-Number of Steps: 3 steps at front with a ramp; 5 steps at garage entry. Does the patient have any problems obtaining your medications?: No  Social/Family/Support Systems Patient Roles: Spouse, Parent (Has a wife, 48 yo and 59 yo sons.) Contact Information: Ainsley Sanguinetti - wife Anticipated Caregiver: wife Anticipated Caregiver's Contact Information: Babette Relic - wife [phone] Ability/Limitations of Caregiver: Wife does not work.  Wife has COPD, occasionally uses a cane and gets SOB.  One son works.  Another son is in the home and has not been working for the past 3 weeks. Caregiver Availability: 24/7 Discharge Plan  Discussed with Primary Caregiver: Yes Is Caregiver In Agreement with Plan?: Yes Does Caregiver/Family have Issues with Lodging/Transportation while Pt is in Rehab?: No  Goals/Additional Needs Patient/Family Goal for Rehab: PT/OT min assist goals Expected length of stay: 17-21 days Cultural Considerations: None Dietary Needs: Regular diet, thin liquids Equipment Needs: TBD Pt/Family Agrees to Admission and willing to participate: Yes Program Orientation Provided & Reviewed with Pt/Caregiver Including Roles  & Responsibilities: Yes  Decrease burden of Care through IP rehab admission: N/A  Possible need for SNF placement upon discharge: No.  Patient refuses to  go to a SNF and plans to go home with wife and sons.  Patient Condition: This patient's medical and functional status has changed since the consult dated: 08/19/15 in which the Rehabilitation Physician determined and documented that the patient's condition is appropriate for intensive rehabilitative care in an inpatient rehabilitation facility. See "History of Present Illness" (above) for medical update. Functional changes are: Currently requiring mod assist +2 for stand pivot transfers.  Patient's medical and functional status update has been discussed with the Rehabilitation physician and patient remains appropriate for inpatient rehabilitation. Will admit to inpatient rehab today.  Preadmission Screen Completed By:  Trish Mage, 08/21/2015 10:31 AM ______________________________________________________________________   Discussed status with Dr. Allena Katz on 08/21/15 at 1030 and received telephone approval for admission today.  Admission Coordinator:  Trish Mage, time1030/Date01/25/17

## 2015-08-21 NOTE — Progress Notes (Signed)
Pt admitted to unit at 1430. RN reviewed rehab booklet, safety plan, and rehab process with pt having verbal understanding.  Patient has wheelchair from home at bedside. Call bell within reach.

## 2015-08-21 NOTE — Progress Notes (Signed)
Pt given his scheduled oxycodone at this time, requested NT to assist with packing his belongings and transfer pt over to rehab, [address] room [phone].

## 2015-08-21 NOTE — Progress Notes (Signed)
Pt transferred over to [address] with belongings, escorted by 2 unit NTs, pt went via recliner chair, his personal wheelchair was taken with him with all other belongings, his wife not present in room at time of transfer, NT reported his wife will be bringing his platform walker from home for his use on rehab unit.

## 2015-08-21 NOTE — H&P (Signed)
Physical Medicine and Rehabilitation Admission H&P    Chief Complaint  Patient presents with  . Leg Pain  . Fall  : HPI: [name] is a 60 y.o. right handed male with history of hypertension, SVT maintained on Eliquis , chronic diastolic congestive heart failure, chronic pain management maintained on MS Contin, chronic left wrist osteomyelitis/MRSA with recent I&D maintained on vancomycin at home followed by 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 Lives with wife and family. Independent with a cane prior to admission. Two-level home with bedroom upstairs. Patient well known to inpatient rehabilitation services October 2016 for sepsis/multi-medical with multiple irrigation and debridements rectus flap procedure. Presented 08/15/2015 after a fall down some stairs when he tripped. Denied loss of consciousness. X-rays and imaging revealed left distal third comminuted tibia shaft fracture. Underwent ORIF 08/17/2015 per Dr. Magnus Ivan. Nonweightbearing left lower extremity 6 weeks and weightbearing through left elbow only for history of left wrist injury/osteomyelitis. Hospital course pain management. Most recent x-rays of left wrist shows diffuse marrow edema throughout the carpus, distal radius distal old. Cortical erosion of the ulnar aspect of distal radius/all. Overall changes similar to appearance from MRI 07/05/2015. Patient currently continues on intravenous vancomycin for recent I&D of left wrist question duration. Physical therapy evaluation completed with recommendations of physical medicine rehabilitation consult. Patient was admitted for a comprehensive rehabilitation program  ROS Constitutional: Negative for fever, chills and malaise/fatigue.  HENT: Negative for hearing loss.  Eyes: Negative for blurred vision and double vision.  Respiratory: Negative for cough and shortness of breath.  Cardiovascular: Positive for leg swelling. Negative for chest pain.    Gastrointestinal: Positive for constipation. Negative for nausea and vomiting.   GERD  Genitourinary: Negative for dysuria and hematuria.  Musculoskeletal: Positive for falls.  Skin: Negative for rash.  Neurological: Positive for headaches. Negative for seizures.  Psychiatric/Behavioral: Positive for depression.   Anxiety  All other systems reviewed and are negative   Past Medical History  Diagnosis Date  . Hypertension   . Chronic knee pain     "both sides"  . Hyperlipemia   . PUD (peptic ulcer disease)   . Peripheral vascular disease (HCC)   . Anxiety   . GERD (gastroesophageal reflux disease)   . 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 Atrial fibrillation (HCC) dx'd 04/2015  . Chronic bronchitis (HCC)     "used to get it q yr; nothing in the last ~ 5 yrs" (08/15/2015)  . Migraine     "q 3 months" (08/15/2015)  . Osteoarthritis of both knees   . Depression    Past Surgical History  Procedure Laterality Date  . I&d extremity Left 07/12/2013    Procedure: I&D Left Thigh Abscess;  Surgeon: [name], MD;  Location: [facility] OR;  Service: Orthopedics;  Laterality: Left;  . Replacement total knee bilateral Bilateral ~ 2007-2013"    "left-right"  . Shoulder arthroscopy w/ rotator cuff repair Bilateral 1992-1993    "right-left"  . Elbow arthroscopy with tendon reconstruction Right 1990  . 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 Incision and drainage abscess Right 04/11/2015    Procedure: INCISION AND DRAINAGE OF RIGHT THIGH ABSCESS;  Surgeon: Fransisco Hertz, MD;  Location: [facility] OR;  Service: Vascular;  Laterality: Right;  .  Incision and drainage abscess Right 04/13/2015    Procedure: INCISION AND DRAINAGE THIGH ABSCESS;  Surgeon: Fransisco Hertz, MD;  Location: [facility] OR;   Service: Vascular;  Laterality: Right;  . I&d extremity Left 04/17/2015    Procedure: IRRIGATION AND DEBRIDEMENT LEFT EXTREMITY;  Surgeon: Fransisco Hertz, MD;  Location: [facility] OR;  Service: Vascular;  Laterality: Left;  Marland Kitchen Muscle flap closure Right 04/19/2015    Procedure: RIGHT RECTUS ABDOMINUS  FLAP TO RIGHT GROIN;  Surgeon: Glenna Fellows, MD;  Location: [facility] OR;  Service: Plastics;  Laterality: Right;  . Femoral revision Right 04/19/2015    Procedure: OVERSEW RIGHT FEMORAL VEIN ;  Surgeon: Chuck Hint, MD;  Location: [facility] OR;  Service: Vascular;  Laterality: Right;  . Wound exploration Right 04/23/2015    Procedure: EXAM UNDER ANESTHESIA AND VAC CHANGE RIGHT THIGH;  Surgeon: Glenna Fellows, MD;  Location: [facility] OR;  Service: Plastics;  Laterality: Right;  . Amputation Right 05/17/2015    Procedure: Right 1st and 2nd Toe Amputation;  Surgeon: Nadara Mustard, MD;  Location: [facility] OR;  Service: Orthopedics;  Laterality: Right;  . I&d extremity Left 07/09/2015    Procedure: IRRIGATION AND DEBRIDEMENT LEFT WRIST;  Surgeon: Knute Neu, MD;  Location: [facility] OR;  Service: Plastics;  Laterality: Left;  . I&d extremity Left 08/08/2015    Procedure: IRRIGATION AND DEBRIDEMENT EXTREMITY;  Surgeon: Knute Neu, MD;  Location: [facility] OR;  Service: Plastics;  Laterality: Left;  . Tonsillectomy  ~ 1972  . Appendectomy  ~ 1980  . Cholecystectomy  ~ 1982  . Inguinal hernia repair Right ~ 1969  . Hemorrhoidectomy with hemorrhoid banding  ~ 1985  . Knee arthroscopy Bilateral 1985-2008    "left-right"  . Joint replacement    . Uvulopalatoplasty  1991  . Cardiac catheterization  1993    "Dr. [place]; for vasospasms"  . Orif tibia fracture Left 08/17/2015    Procedure: OPEN REDUCTION INTERNAL FIXATION (ORIF) TIBIA FRACTURE;  Surgeon: Kathryne Hitch, MD;  Location: [facility] OR;  Service: Orthopedics;  Laterality: Left;   Family History  Problem Relation Age of Onset  . Heart attack Paternal Grandmother 69  . Heart  disease Mother 38    arrythmia  . Stroke Paternal Grandfather   . Stroke Maternal Grandfather    Social History:  reports that he has quit smoking. His smoking use included Cigarettes. He has a 3 pack-year smoking history. He has never used smokeless tobacco. He reports that he does not drink alcohol or use illicit drugs. Allergies:  Allergies  Allergen Reactions  . Verapamil Other (See Comments)    Causes pvc's  . Imitrex [Sumatriptan] Other (See Comments)    Chest pain, 2004 (tolerates 50 mg prn)   Medications Prior to Admission  Medication Sig Dispense Refill  . albuterol (PROAIR HFA) 108 (90 BASE) MCG/ACT inhaler Inhale 1 puff into the lungs every 6 (six) hours as needed for wheezing or shortness of breath.     Marland Kitchen apixaban (ELIQUIS) 5 MG TABS tablet Take 1 tablet (5 mg total) by mouth 2 (two) times daily. 60 tablet 0  . atorvastatin (LIPITOR) 80 MG tablet Take 1 tablet (80 mg total) by mouth daily. 30 tablet 1  . docusate sodium (COLACE) 100 MG capsule Take 1 capsule (100 mg total) by mouth 2 (two) times daily. 10 capsule 0  . DULoxetine (CYMBALTA) 30 MG capsule Take 60 mg by mouth daily at 12 noon.  0  . flecainide (TAMBOCOR) 100 MG  tablet Take 1 tablet (100 mg total) by mouth every 12 (twelve) hours. 60 tablet 1  . gabapentin (NEURONTIN) 100 MG capsule Take 1 capsule (100 mg total) by mouth 3 (three) times daily. 90 capsule 1  . Metoprolol Tartrate 75 MG TABS Take 75 mg by mouth 2 (two) times daily. 60 tablet 1  . morphine (MS CONTIN) 30 MG 12 hr tablet Take 3 tablets (90 mg total) by mouth every 12 (twelve) hours. 7 tablet 0  . Multiple Vitamin (MULTIVITAMIN WITH MINERALS) TABS tablet Take 1 tablet by mouth daily.    . naloxegol oxalate (MOVANTIK) 25 MG TABS tablet Take 1 tablet (25 mg total) by mouth daily before breakfast. 30 tablet 0  . oxycodone (ROXICODONE) 30 MG immediate release tablet Take 1 tablet (30 mg total) by mouth every 6 (six) hours. scheduled 13 tablet 0  .  polyethylene glycol (MIRALAX / GLYCOLAX) packet Take 17 g by mouth daily. 100 each 0  . temazepam (RESTORIL) 15 MG capsule Take 30 mg by mouth at bedtime.   0  . testosterone cypionate (DEPOTESTOSTERONE CYPIONATE) 200 MG/ML injection Inject 100 mg into the muscle every 14 (fourteen) days. Reported on 08/05/2015  0  . vancomycin 1,250 mg in sodium chloride 0.9 % 250 mL Inject 1,250 mg into the vein every 12 (twelve) hours. 60 Syringe 0  . venlafaxine XR (EFFEXOR-XR) 75 MG 24 hr capsule Take 1 capsule (75 mg total) by mouth daily with breakfast. 30 capsule 1  . Diclofenac-Misoprostol 75-0.2 MG TBEC     . famciclovir (FAMVIR) 500 MG tablet Take 500 mg by mouth 2 (two) times daily as needed (fever blisters).     . SUMAtriptan (IMITREX) 50 MG tablet Take 50 mg by mouth daily as needed for migraine or headache. Maximum 2 doses in 2 days  0  . trifluridine (VIROPTIC) 1 % ophthalmic solution Place 1 drop into both eyes 2 (two) times daily as needed (breakouts (blurry, matted eyelids)).      Home: Home Living Family/patient expects to be discharged to:: Private residence Living Arrangements: Spouse/significant other, Children (2 sons) Available Help at Discharge: Family, Available 24 hours/day Type of Home: House Home Access: Stairs to enter [employer] of Steps: 5 through garage; 4 through front Entrance Stairs-Rails: Right, Left, Can reach both Home Layout: Two level, 1/2 bath on main level, Bed/bath upstairs Alternate Level Stairs-Number of Steps: Flight Alternate Level Stairs-Rails: Right, Left, Can reach both Bathroom Shower/Tub: Tub/shower unit, Engineer, building services: Standard Bathroom Accessibility: Yes Home Equipment: Toilet riser, Grab bars - tub/shower, Hand held shower head, Wheelchair - manual, Tub bench (platform RW) Additional Comments: Apparently had a rehab stay last year in October with successful dc home   Functional History: Prior Function Level of Independence:  Needs assistance Gait / Transfers Assistance Needed: Using wheelchair for most mobility, occassionally ambulated with platform RW ADL's / Homemaking Assistance Needed: Assistance with LB dressing, sons complete cooking/cleaning Comments: Used to love playing golf and basketball; likes to watch sports on TV.  Is now on disability, former Photographer Status:  Mobility: Bed Mobility Overal bed mobility: Modified Independent Bed Mobility: Supine to Sit Supine to sit: Modified independent (Device/Increase time) Sit to supine: Min guard General bed mobility comments: increased time needed  Transfers Overall transfer level: Needs assistance Equipment used: Left platform walker Transfers: Sit to/from Stand Sit to Stand: +2 physical assistance, Mod assist Stand pivot transfers: +2 physical assistance, Mod assist Squat pivot transfers: +2 physical assistance,  Mod assist General transfer comment: vc for hand/foot placement and technique with assist to power up into standing; assist for pivot to chair with vc for sequencing; pt able to maintain NWB throughout pivot transfer and improved ability with sit to stand Ambulation/Gait General Gait Details: Unable    ADL: ADL Overall ADL's : Needs assistance/impaired Grooming: Wash/dry hands, Wash/dry face, Oral care, Set up, Sitting Upper Body Bathing: Set up, Sitting Lower Body Bathing: Set up, Sitting/lateral leans Upper Body Dressing : Set up, Sitting Lower Body Dressing: Minimal assistance, Sitting/lateral leans Lower Body Dressing Details (indicate cue type and reason): able to cross R ankle over L knee (prohibited from doing this with L ankle). Min assist to fully reach feet Functional mobility during ADLs: Maximal assistance, +2 for physical assistance General ADL Comments: Unable to achieve full standing position to attempt transfers. Completed ADLs sitting EOB and reviewed pain management strategies (elevating,  maintaining neutral hip position, and icing LLE)  Cognition: Cognition Overall Cognitive Status: Within Functional Limits for tasks assessed Orientation Level: Oriented X4 Cognition Arousal/Alertness: Awake/alert Behavior During Therapy: WFL for tasks assessed/performed Overall Cognitive Status: Within Functional Limits for tasks assessed  Physical Exam: Blood pressure 107/85, pulse 85, temperature 98.1 F (36.7 C), temperature source Oral, resp. rate 18, height  (1.905 m), weight 100.6 kg (221 lb 12.5 oz), SpO2 99 %. Physical Exam Constitutional: He is oriented to person, place, and time. He appears well-developed and well-nourished.  HENT:  Head: Normocephalic and atraumatic.  Eyes: Conjunctivae and EOM are normal.  Neck: Normal range of motion. Neck supple. No thyromegaly present.  Cardiovascular: Normal rate and regular rhythm.  Respiratory: Effort normal and breath sounds normal. No respiratory distress.  GI: Soft. Bowel sounds are normal. He exhibits no distension.  Musculoskeletal: He exhibits edema and tenderness.  Neurological: He is alert and oriented to person, place, and time.  Motor: RUE 5/5 proximal to distal LUE 4/5 shoulder abduction, 4-/5 elbow flexion/extension, 4/5 hand grip RLE: hip flexion, knee extension 4+/5, ankle dorsi/plantar flexion 4+/5 LLE: hip flexion 4/5, ankle dorsi/plantar flexion 4+/5  Sensation diminished symetrically distal to knees  Skin: Skin is warm and dry.  Left upper extremity with dressing in place appropriately tender.  Left lower extremity surgical dressing in place  Psychiatric: His speech is normal. Judgment normal. Cognition and memory are normal   Results for orders placed or performed during the hospital encounter of 08/15/15 (from the past 48 hour(s))  CBC     Status: Abnormal   Collection Time: 08/20/15  5:15 AM  Result Value Ref Range   WBC 6.9 4.0 - 10.5 K/uL   RBC 3.62 (L) 4.22 - 5.81 MIL/uL   Hemoglobin 9.9 (L)  13.0 - 17.0 g/dL   HCT 16.1 (L) 09.6 - 04.5 %   MCV 85.6 78.0 - 100.0 fL   MCH 27.3 26.0 - 34.0 pg   MCHC 31.9 30.0 - 36.0 g/dL   RDW 40.9 (H) 81.1 - 91.4 %   Platelets 218 150 - 400 K/uL   No results found.   Medical Problem List and Plan: 1.  Left distal third comminuted tibia shaft fracture secondary to fall. Status post ORIF 08/17/2015. Nonweightbearing 6 weeks 2.  DVT Prophylaxis/Anticoagulation: Eliquis for history of SVT. 3. Pain Management/chronic pain management: MS Contin 90 mg every 12 hours, Neurontin 100 mg 3 times a day, Cymbalta 60 mg daily, Robaxin and oxycodone as needed. 4. Mood: Effexor 75 mg daily. 5. Neuropsych: This patient is capable of  making decisions on his own behalf. 6. Skin/Wound Care: Routine skin checks 7. Fluids/Electrolytes/Nutrition: Routine I&O's with follow-up chemistries 8. Chronic left wrist osteomyelitis/MRSA. Status post recent I&D per Dr.Coley and maintain on intravenous vancomycin prior to admission. Will confirm duration of antibiotics. Weightbearing as tolerated to left elbow only 9. History of SVT. Cardiac rate controlled. Continue Tambocor 100 mg every 12 hours, Lopressor 75 mg twice a day 10. Chronic diastolic congestive heart failure. Monitor for any signs of fluid overload 11. Hyperlipidemia. Lipitor 12. History of toe amputation on right: Cont education with b/l LE deficits  Post Admission Physician Evaluation: 1. Functional deficits secondary to Left distal third comminuted tibia shaft fracture s/p fall.  2. Patient is admitted to receive collaborative, interdisciplinary care between the physiatrist, rehab nursing staff, and therapy team. 3. Patient's level of medical complexity and substantial therapy needs in context of that medical necessity cannot be provided at a lesser intensity of care such as a SNF. 4. Patient has experienced substantial functional loss from his/her baseline which was documented above under the "Functional  History" and "Functional Status" headings.  Judging by the patient's diagnosis, physical exam, and functional history, the patient has potential for functional progress which will result in measurable gains while on inpatient rehab.  These gains will be of substantial and practical use upon discharge  in facilitating mobility and self-care at the household level. 5. Physiatrist will provide 24 hour management of medical needs as well as oversight of the therapy plan/treatment and provide guidance as appropriate regarding the interaction of the two. 6. 24 hour rehab nursing will assist with safety, skin/wound care, pain management and patient education and help integrate therapy concepts, techniques,education, etc. 7. PT will assess and treat for/with: Lower extremity strength, range of motion, stamina, balance, functional mobility, safety, adaptive techniques and equipment, woundcare, coping skills, pain control, education.   Goals are: Min A/Supervision. 8. OT will assess and treat for/with: ADL's, functional mobility, safety, upper extremity strength, adaptive techniques and equipment, wound mgt, ego support, and community reintegration.   Goals are: Min A/Supervision. Therapy may not proceed with showering this patient. 9. Case Management and Social Worker will assess and treat for psychological issues and discharge planning. 10. Team conference will be held weekly to assess progress toward goals and to determine barriers to discharge. 11. Patient will receive at least 3 hours of therapy per day at least 5 days per week. 12. ELOS: 14-18 days.     13. Prognosis:  excellent  Maryla Morrow, MD 08/21/2015

## 2015-08-21 NOTE — Progress Notes (Signed)
Pharmacy Antibiotic Follow-up Note [name] is a 60 y.o. year-old male admitted on 08/15/2015.  The patient is currently on day 7 of vancomycin while inpatient (was on PTA) for progressive osteomyelitis of the left wrist with septic arthritis due from potential bacillus. He is followed by ID as outpatient.   Assessment/Plan: 1. Continue current dose of vancomycin of 1500 mg Q12 hours  2. Weekly trough unless clinical course dictates otherwise 3. SCr q 72H; will order one for 1/26 4. Following daily; notes prn   Recent Labs Lab 08/15/15 1055 08/20/15 0515  WBC 7.8 6.9    Recent Labs Lab 08/15/15 1055 08/18/15 1425  CREATININE 1.03 0.98    Antimicrobials this admission: 1/18 vancomycin  >>  Levels/dose changes this admission: 1/21 VT: 12 on 1250 mg q12h, increase to 1500 mg q12h  1/23 VT: 18, cont 1500 mg q12h  Microbiology results: 1/12 Tissue: ngF 1/8 UCx: ngF  1/8 blood: ngF  07/09/15 L wrist wound: Bacillus species   Thank you for allowing pharmacy to be a part of this patient's care.  Pollyann Samples, PharmD, BCPS 08/21/2015, 11:01 AM Pager: [phone]

## 2015-08-21 NOTE — Progress Notes (Signed)
Report given to [place], RN on unit 4 [id number], pt will transfer to room [phone].

## 2015-08-22 ENCOUNTER — Inpatient Hospital Stay (HOSPITAL_COMMUNITY): Payer: Medicaid Other

## 2015-08-22 ENCOUNTER — Inpatient Hospital Stay (HOSPITAL_COMMUNITY): Payer: Medicaid Other | Admitting: Occupational Therapy

## 2015-08-22 DIAGNOSIS — R609 Edema, unspecified: Secondary | ICD-10-CM

## 2015-08-22 LAB — COMPREHENSIVE METABOLIC PANEL
ALT: 12 U/L — ABNORMAL LOW (ref 17–63)
ANION GAP: 11 (ref 5–15)
AST: 12 U/L — ABNORMAL LOW (ref 15–41)
Albumin: 2.9 g/dL — ABNORMAL LOW (ref 3.5–5.0)
Alkaline Phosphatase: 89 U/L (ref 38–126)
BILIRUBIN TOTAL: 0.6 mg/dL (ref 0.3–1.2)
BUN: 11 mg/dL (ref 6–20)
CO2: 26 mmol/L (ref 22–32)
Calcium: 9 mg/dL (ref 8.9–10.3)
Chloride: 100 mmol/L — ABNORMAL LOW (ref 101–111)
Creatinine, Ser: 1 mg/dL (ref 0.61–1.24)
GFR calc Af Amer: >60 mL/min (ref >60)
Glucose, Bld: 117 mg/dL — ABNORMAL HIGH (ref 65–99)
POTASSIUM: 3.8 mmol/L (ref 3.5–5.1)
Sodium: 137 mmol/L (ref 135–145)
TOTAL PROTEIN: 7.1 g/dL (ref 6.5–8.1)

## 2015-08-22 LAB — CBC WITH DIFFERENTIAL/PLATELET
BASOS PCT: 0 %
Basophils Absolute: 0 10*3/uL (ref 0.0–0.1)
EOS PCT: 3 %
Eosinophils Absolute: 0.3 10*3/uL (ref 0.0–0.7)
HEMATOCRIT: 33.7 % — AB (ref 39.0–52.0)
Hemoglobin: 10.7 g/dL — ABNORMAL LOW (ref 13.0–17.0)
Lymphocytes Relative: 27 %
Lymphs Abs: 2.5 10*3/uL (ref 0.7–4.0)
MCH: 27.2 pg (ref 26.0–34.0)
MCHC: 31.8 g/dL (ref 30.0–36.0)
MCV: 85.8 fL (ref 78.0–100.0)
MONO ABS: 1 10*3/uL (ref 0.1–1.0)
MONOS PCT: 11 %
NEUTROS ABS: 5.5 10*3/uL (ref 1.7–7.7)
Neutrophils Relative %: 59 %
Platelets: 321 10*3/uL (ref 150–400)
RBC: 3.93 MIL/uL — ABNORMAL LOW (ref 4.22–5.81)
RDW: 16.5 % — AB (ref 11.5–15.5)
WBC: 9.3 10*3/uL (ref 4.0–10.5)

## 2015-08-22 MED ORDER — DICLOFENAC SODIUM 1 % TD GEL
2.0000 g | Freq: Four times a day (QID) | TRANSDERMAL | Status: DC
  Administered 2015-08-23 – 2015-08-31 (×27): 2 g via TOPICAL
  Filled 2015-08-22: qty 100

## 2015-08-22 MED ORDER — OXYCODONE HCL 5 MG PO TABS
5.0000 mg | ORAL_TABLET | Freq: Four times a day (QID) | ORAL | Status: DC | PRN
  Administered 2015-08-22: 5 mg via ORAL
  Filled 2015-08-22: qty 1

## 2015-08-22 MED ORDER — OXYCODONE HCL 5 MG PO TABS
5.0000 mg | ORAL_TABLET | ORAL | Status: DC | PRN
  Administered 2015-08-22 (×2): 5 mg via ORAL
  Filled 2015-08-22 (×2): qty 1

## 2015-08-22 NOTE — Progress Notes (Signed)
*  PRELIMINARY RESULTS* Vascular Ultrasound Lower extremity venous duplex has been completed.  Preliminary findings: Technically difficult study due to depth of vessels. No obvious DVT noted bilaterally.   Farrel Demark, RDMS, RVT  08/22/2015, 4:26 PM

## 2015-08-22 NOTE — Plan of Care (Signed)
Problem: RH PAIN MANAGEMENT Goal: RH STG PAIN MANAGED AT OR BELOW PT'S PAIN GOAL Outcome: Progressing 3/10     

## 2015-08-22 NOTE — Progress Notes (Signed)
Occupational Therapy Session Note  Patient Details  Name: [name] MRN: [id number] Date of Birth: April 19, 1956  Today's Date: 08/22/2015 OT Individual Time: 1300-1400 OT Individual Time Calculation (min): 60 min    Short Term Goals: Week 1:  OT Short Term Goal 1 (Week 1): Pt will be min assist with LB ADLs using AE prn OT Short Term Goal 2 (Week 1): Pt will be educated on a UB HEP for BUEs and trunk OT Short Term Goal 3 (Week 1): Pt will be supervision with toilet transfers, no cueing needed for correct position of w/c or body during transfers OT Short Term Goal 4 (Week 1): Pt will be min assist for simple meal prep performing sit to/from stands prn/appropriately   Skilled Therapeutic Interventions/Progress Updates:   Pt worked on donning right shoe and tying it at EOB during session.  He was able to complete with supervision but needed total assist for donning cam boot on the LLE.  Impulsive with sit to stand transition in order to get over to the wheelchair.  Needs mod instructional cueing for technique in order to avoid weightbearing over the LLE.  He was able to complete transfer to the chair with mod assist using the RW with platform on the left side.  She was able to roll himself down to the therapy gym with supervision and transfer to the mat with mod assist stand pivot.  Worked on sit to stand transitions from elevated mat with mod assist.  Adjusted platform on RW to allow for greater comfort with pt during standing and transfers.  His spouse was present during session and able to observe as necessary.    Therapy Documentation Precautions:  Precautions Precautions: Fall Precaution Comments: Right toe amputations, increased pain in right knee with mobility and in standing  Restrictions Weight Bearing Restrictions: Yes LUE Weight Bearing: Weight bear through elbow only LLE Weight Bearing: Non weight bearing  Vital Signs: Therapy Vitals Temp: 98.4 F (36.9 C) Temp Source:  Oral Pulse Rate: 97 Resp: 18 BP: 118/79 mmHg Patient Position (if appropriate): Sitting Oxygen Therapy SpO2: 98 % O2 Device: Not Delivered Pulse Oximetry Type: Intermittent Pain: Pain Assessment Pain Assessment: Faces Faces Pain Scale: Hurts little more Pain Type: Surgical pain Pain Location: Leg Pain Orientation: Left Pain Intervention(s): Repositioned;Emotional support ADL: See Function Navigator for Current Functional Status.   Therapy/Group: Individual Therapy  Basim Bartnik OTR/L 08/22/2015, 4:15 PM

## 2015-08-22 NOTE — Evaluation (Signed)
Physical Therapy Assessment and Plan  Patient Details  Name: [name] MRN: [id number] Date of Birth: [birth year]  PT Diagnosis: Difficulty walking, Muscle weakness and Pain in joint Rehab Potential: Good ELOS: 2 weeks   Today's Date: 08/22/2015 PT Individual Time: 4403 - 1005      Problem List:  Patient Active Problem List   Diagnosis Date Noted  . Abnormality of gait 08/21/2015  . Fracture of left tibia 08/21/2015  . HLD (hyperlipidemia)   . Chronic diastolic congestive heart failure ([place])   . Depression   . Post-operative pain   . Essential hypertension   . Paroxysmal SVT (supraventricular tachycardia) ([place])   . Osteomyelitis of left wrist (HCC)   . History of amputation of lesser toe of right foot ([place])   . Fracture of shaft of left tibia 08/15/2015  . Fracture, tibia, shaft 08/15/2015  . Acute osteomyelitis of right radius ([place])   . Osteomyelitis ([place]) 07/05/2015  . Paroxysmal atrial fibrillation (HCC)   . Wrist osteomyelitis, left ([place]) 06/05/2015  . Foot osteomyelitis, right ([place]) 06/05/2015  . Wound of right ankle 06/05/2015  . Status post amputation of great toe ([place])   . Pain   . Swelling   . Warmth of joint   . Mycotic aneurysm   . Thigh abscess   . Hand abscess   . Septic embolism ([place])   . Gangrene of toe ([place])   . Acute osteomyelitis of left ulna (HCC)   . Streptococcal arthritis of left wrist (HCC)   . Dry gangrene ([place])   . Wound, open, foot with complication   . Acute deep vein thrombosis (DVT) of distal end of right lower extremity ([place])   . Left wrist fracture   . Abscess of bursa, left wrist   . SVT (supraventricular tachycardia) (HCC) 05/08/2015  . 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 Adjustment disorder with depressed mood   . 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 Debility 05/01/2015  . Sepsis ([place]) 04/30/2015  . Acute renal failure with tubular necrosis ([place])   . Abscess of left hand   . Staphylococcus aureus  bacteremia with sepsis ([place])   . Colles' fracture of left radius   . Cellulitis of left upper extremity   . Diastolic dysfunction   . Nondisplaced fracture of distal end of left radius   . Abscess   . Bacteremia due to Staphylococcus   . Pulmonary hypertension ([place])   . Acute blood loss anemia   . 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 DVT, lower extremity ([place])   . Radius distal fracture   . Cellulitis of finger of left hand   . Acute pyelonephritis   . Arm pain, anterior   . Cellulitis   . Bacteremia due to Staphylococcus aureus   . Acute renal failure syndrome ([place])   . Fracture of left radius   . ARF (acute renal failure) ([place]) 04/07/2015  . Severe sepsis ([place]) 04/07/2015  . Cellulitis of left hand 04/07/2015  . Elevated INR 04/07/2015  . Foot swelling 04/07/2015  . Elevated bilirubin 04/07/2015  . Hypokalemia 04/07/2015  . Hypomagnesemia 04/07/2015  . Femoral artery aneurysm, right ([place]) 04/07/2015  . Bacteremia 04/07/2015  . UTI (lower urinary tract infection)   . Chest pain 11/15/2013  . History of duodenal ulcer 11/15/2013  . 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 Septic shock due to abscess 07/13/2013  . Chronic use of opiate drugs therapeutic purposes 07/12/2013  . Acute renal  failure ([place]) 07/11/2013  . Chronic pain syndrome 07/11/2013  . Testosterone deficiency 07/11/2013    Past Medical History:  Past Medical History  Diagnosis Date  . Hypertension   . Chronic knee pain     "both sides"  . Hyperlipemia   . PUD (peptic ulcer disease)   . Peripheral vascular disease ([place])   . Anxiety   . GERD (gastroesophageal reflux disease)   . Wrist osteomyelitis, left ([place]) 06/05/2015  . Foot osteomyelitis, right ([place]) 06/05/2015  . Atrial fibrillation ([place]) dx'd 04/2015  . Chronic bronchitis ([place])     "used to get it q yr; nothing in the last ~ 5 yrs" (08/15/2015)  . Migraine     "q 3 months" (08/15/2015)  . Osteoarthritis of both knees   . Depression    Past Surgical  History:  Past Surgical History  Procedure Laterality Date  . I&d extremity Left 07/12/2013    Procedure: I&D Left Thigh Abscess;  Surgeon: Wylene Simmer, MD;  Location: [facility];  Service: Orthopedics;  Laterality: Left;  . Replacement total knee bilateral Bilateral ~ 2007-2013"    "left-right"  . Shoulder arthroscopy w/ rotator cuff repair Bilateral 1992-1993    "right-left"  . Elbow arthroscopy with tendon reconstruction Right 1990  . Femoral artery exploration Right 04/11/2015    Procedure: Resection of infected right femoral artery aneurysm;  Surgeon: Conrad Clute, MD;  Location: [facility];  Service: Vascular;  Laterality: Right;  . Incision and drainage abscess Right 04/11/2015    Procedure: INCISION AND DRAINAGE OF RIGHT THIGH ABSCESS;  Surgeon: Conrad Martin, MD;  Location: [facility];  Service: Vascular;  Laterality: Right;  . Incision and drainage abscess Right 04/13/2015    Procedure: INCISION AND DRAINAGE THIGH ABSCESS;  Surgeon: Conrad Montvale, MD;  Location: [facility];  Service: Vascular;  Laterality: Right;  . I&d extremity Left 04/17/2015    Procedure: IRRIGATION AND DEBRIDEMENT LEFT EXTREMITY;  Surgeon: Conrad Ledyard, MD;  Location: [facility];  Service: Vascular;  Laterality: Left;  Marland Kitchen Muscle flap closure Right 04/19/2015    Procedure: RIGHT RECTUS ABDOMINUS  FLAP TO RIGHT GROIN;  Surgeon: Irene Limbo, MD;  Location: [facility];  Service: Plastics;  Laterality: Right;  . Femoral revision Right 04/19/2015    Procedure: OVERSEW RIGHT FEMORAL VEIN ;  Surgeon: Angelia Mould, MD;  Location: [facility];  Service: Vascular;  Laterality: Right;  . Wound exploration Right 04/23/2015    Procedure: EXAM UNDER ANESTHESIA AND VAC CHANGE RIGHT THIGH;  Surgeon: Irene Limbo, MD;  Location: [facility];  Service: Plastics;  Laterality: Right;  . Amputation Right 05/17/2015    Procedure: Right 1st and 2nd Toe Amputation;  Surgeon: Newt Minion, MD;  Location: [facility];  Service: Orthopedics;  Laterality: Right;  . I&d  extremity Left 07/09/2015    Procedure: IRRIGATION AND DEBRIDEMENT LEFT WRIST;  Surgeon: Dayna Barker, MD;  Location: [facility];  Service: Plastics;  Laterality: Left;  . I&d extremity Left 08/08/2015    Procedure: IRRIGATION AND DEBRIDEMENT EXTREMITY;  Surgeon: Dayna Barker, MD;  Location: [facility];  Service: Plastics;  Laterality: Left;  . Tonsillectomy  ~ 1972  . Appendectomy  ~ 1980  . Cholecystectomy  ~ 1982  . Inguinal hernia repair Right ~ 1969  . Hemorrhoidectomy with hemorrhoid banding  ~ 1985  . Knee arthroscopy Bilateral 1985-2008    "left-right"  . Joint replacement    . Uvulopalatoplasty  1991  . Cardiac catheterization  1993    "  Dr. Martinique; for vasospasms"  . Orif tibia fracture Left 08/17/2015    Procedure: OPEN REDUCTION INTERNAL FIXATION (ORIF) TIBIA FRACTURE;  Surgeon: Mcarthur Rossetti, MD;  Location: [facility];  Service: Orthopedics;  Laterality: Left;    Assessment & Plan Clinical Impression: DAUNE COLGATE is a 60 y.o. 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 Lives with wife and family. Independent with a cane prior to admission. Two-level home with bedroom upstairs. Patient well known to inpatient rehabilitation services October 2016 for sepsis/multi-medical with multiple irrigation and debridements rectus flap procedure. Presented 08/15/2015 after a fall down some stairs when he tripped. Denied loss of consciousness. X-rays and imaging revealed left distal third comminuted tibia shaft fracture. Underwent ORIF 08/17/2015 per Dr. Ninfa Linden. Nonweightbearing left lower extremity and weightbearing through left elbow only for history of left wrist injury. Hospital course pain management. Most recent x-rays of left wrist shows diffuse marrow edema throughout the carpus, distal  radius distal old. Cortical erosion of the ulnar aspect of distal radius/all. Overall changes similar to appearance from MRI 07/05/2015. Patient currently continues on intravenous vancomycin for recent I&D of left wrist. Patient transferred to CIR on 08/21/2015.    Patient currently requires mod with mobility secondary to muscle weakness and pain.  Prior to hospitalization, patient was modified independent  with mobility and lived with Spouse in a House home.  Home access is 5 through garage; 3 through front with a rampStairs to enter.  Patient will benefit from skilled PT intervention to maximize safe functional mobility, minimize fall risk and decrease caregiver burden for planned discharge home with 24 hour assist.  Anticipate patient will benefit from follow up [facility] at discharge.  PT - End of Session Activity Tolerance: Decreased this session;Tolerates 30+ min activity with multiple rests Endurance Deficit: Yes PT Assessment Rehab Potential (ACUTE/IP ONLY): Good PT Patient demonstrates impairments in the following area(s): Balance;Endurance;Pain;Safety;Skin Integrity PT Transfers Functional Problem(s): Bed Mobility;Bed to Chair;Car;Furniture PT Locomotion Functional Problem(s): Wheelchair Mobility;Ambulation PT Plan PT Intensity: Minimum of 1-2 x/day ,45 to 90 minutes PT Frequency: 5 out of 7 days PT Duration Estimated Length of Stay: 2 weeks PT Treatment/Interventions: Ambulation/gait training;Pain management;Balance/vestibular training;DME/adaptive equipment instruction;Patient/family education;Therapeutic Activities;Wheelchair propulsion/positioning;Therapeutic Exercise;Functional mobility training;UE/LE Strength taining/ROM;Discharge planning;Skin care/wound management PT Transfers Anticipated Outcome(s): S PT Locomotion Anticipated Outcome(s): mod I w/c; min A short household ambulation PT Recommendation Follow Up Recommendations: Home health PT Patient destination: Home Equipment  Recommended: None recommended by PT  Skilled Therapeutic Intervention Patient seen for evaluation and treatment including work on functional squat pivot as well as stand pivot transfers with PFRW, w/c mobility with R UE/LE over level tile surfaces.  Standing tolerance (able to stand 1 min 45 sec while maintaining NWB L LE.  Also worked on LE therex in supine including heel slides and SLR x 10 each leg for strength and AROM.  Patient performed car transfer into car stand pivot with PFRW and min/mod A, then into chair via squat pivot min/mod A for safety due to uncontrolled technique.  Patient in his w/c from home, fitted with L ELR adjusted for length.  Also fitted walker in his room with L UE platform due to weight bearing on elbow only. Patient able to propel with supervision to and from therapy gym with cues for directions and was left in room in chair  with needs in reach with call to nurse for ice pack to L LE due to errythema and swelling.  PT Evaluation Precautions/Restrictions Precautions Precautions: Fall Precaution Comments: Right toe amputations, increased pain in right knee with mobility and in standing  Restrictions Weight Bearing Restrictions: Yes LUE Weight Bearing: Weight bear through elbow only LLE Weight Bearing: Non weight bearing Pain Pain Assessment Pain Assessment: 0-10 Pain Score: 7  Pain Type: Surgical pain Pain Location: Leg Pain Orientation: Lower;Left Pain Onset: On-going Pain Intervention(s): RN made aware;Repositioned Multiple Pain Sites: Yes 2nd Pain Site Pain Score: 7 Pain Type: Chronic pain Pain Location: Knee Pain Orientation: Right Pain Descriptors / Indicators: Sharp Pain Frequency: Constant (when moving it) Pain Onset: On-going Pain Intervention(s): RN made aware;Repositioned Home Living/Prior Functioning Home Living Available Help at Discharge: Family;Available 24 hours/day (wife and children) Type of Home: House Home Access: Stairs to  enter [employer] of Steps: 5 through garage; 3 through front with a ramp Entrance Stairs-Rails: Right;Left;Can reach both Home Layout: Two level;1/2 bath on main level;Bed/bath upstairs Alternate Level Stairs-Number of Steps: Flight Alternate Level Stairs-Rails: Right;Left;Can reach both Bathroom Shower/Tub: Tub/shower unit;Curtain Bathroom Toilet: Standard Bathroom Accessibility: Yes Additional Comments: Pt reports he has a tub transfer bench and BSC for use at home. He actively uses both. Pt also reports he has grab bars on left side of shower.   Lives With: Spouse Prior Function Level of Independence: Needs assistance with ADLs;Requires assistive device for independence Bath: Supervision/set-up  Able to Take Stairs?: Yes Driving: No Vocation: On disability (has applied for disability) Comments: Used to love playing golf and basketball; likes to watch sports on TV.  Is now on disability, former [employer] officer Vision/Perception     Cognition Overall Cognitive Status: Within Functional Limits for tasks assessed Arousal/Alertness: Awake/alert Memory: Appears intact Awareness: Appears intact Problem Solving: Appears intact Safety/Judgment: Appears intact Sensation Sensation Light Touch: Impaired by gross assessment (left fingers, pt reports numbness/tingling and "feels funny" when being touched) Light Touch Impaired Details: Impaired LUE Proprioception: Appears Intact Coordination Gross Motor Movements are Fluid and Coordinated: Yes (decreased strength in left hand which somewhat limits these movements) Fine Motor Movements are Fluid and Coordinated: Yes (decreased strength in left hand which somewhat limits these movements) Motor     Mobility Bed Mobility Bed Mobility: Rolling Right;Rolling Left;Supine to Sit Rolling Right: 5: Supervision Rolling Left: 5: Supervision Supine to Sit: 5: Supervision;HOB elevated Supine to Sit Details: Visual cues/gestures  for precautions/safety Transfers Transfers: Yes Sit to Stand: 3: Mod assist Sit to Stand Details: Verbal cues for safe use of DME/AE;Verbal cues for technique Stand Pivot Transfers: 3: Mod assist Stand Pivot Transfer Details: Visual cues for safe use of DME/AE Squat Pivot Transfers: 3: Mod assist;With upper extremity assistance;With armrests Squat Pivot Transfer Details: Verbal cues for precautions/safety;Verbal cues for safe use of DME/AE;Verbal cues for technique Locomotion  Ambulation Ambulation: Yes Ambulation/Gait Assistance: 3: Mod assist Ambulation Distance (Feet): 3 Feet Assistive device: Left platform walker Ambulation/Gait Assistance Details: Verbal cues for technique Ambulation/Gait Assistance Details: 3 steps forward and back with assist for balance/safety with walker Stairs / Additional Locomotion Stairs: No Wheelchair Mobility Wheelchair Mobility: Yes Wheelchair Assistance: 5: Personnel officer Assistance Details: Verbal cues for precautions/safety Wheelchair Propulsion: Right upper extremity;Right lower extremity Wheelchair Parts Management: Supervision/cueing Distance: 150'  Trunk/Postural Assessment  Cervical Assessment Cervical Assessment: Within Functional Limits Thoracic Assessment Thoracic Assessment: Within Functional Limits Lumbar Assessment Lumbar Assessment: Within Functional Limits Postural Control Postural Control: Deficits on evaluation Postural  Limitations: rounded trunk esp in thoracic spine, forward head  Balance Balance Balance Assessed: Yes Static Sitting Balance Static Sitting - Balance Support: No upper extremity supported;Feet supported Static Sitting - Level of Assistance: 6: Modified independent (Device/Increase time) Dynamic Sitting Balance Dynamic Sitting - Balance Support: Feet supported;During functional activity Dynamic Sitting - Level of Assistance: 4: Min assist Sitting balance - Comments: due to NWB LLE Static Standing  Balance Static Standing - Balance Support: Bilateral upper extremity supported;Right upper extremity supported Static Standing - Level of Assistance: 4: Min assist Extremity Assessment  RUE Assessment RUE Assessment: Within Functional Limits (rotator cuff surgery in RUE ~20 years ago) LUE Assessment LUE Assessment: Exceptions to WFL LUE AROM (degrees) LUE Overall AROM Comments: limited shoulder flexion (~90*) due to h/o rotator cuff surgery~20 years ago. Pt reports this shoulder has been limited for ~6 months to 1 year LUE PROM (degrees) LUE Overall PROM Comments: WFL to ~120*, then pt with increased pain LUE Strength LUE Overall Strength Comments: shoulder strength grossly 3+/5, elbow strength grossly 4/5       See Function Navigator for Current Functional Status.   Refer to Care Plan for Long Term Goals  Recommendations for other services: None  Discharge Criteria: Patient will be discharged from PT if patient refuses treatment 3 consecutive times without medical reason, if treatment goals not met, if there is a change in medical status, if patient makes no progress towards goals or if patient is discharged from hospital.  The above assessment, treatment plan, treatment alternatives and goals were discussed and mutually agreed upon: by patient  Reginia Naas 08/22/2015, 1:17 PM  Magda Kiel, [place] 08/22/2015

## 2015-08-22 NOTE — Evaluation (Signed)
Occupational Therapy Assessment & Plan and Session Note  Patient Details  Name: [name] MRN: [id number] Date of Birth: 1955-10-12  OT Diagnosis: acute pain and muscle weakness (generalized) Rehab Potential: Rehab Potential (ACUTE ONLY): Good ELOS: 12-14 days   Today's Date: 08/22/2015 OT Individual Time: 7412-8786 OT Individual Time Calculation (min): 60 min     Problem List:  Patient Active Problem List   Diagnosis Date Noted  . Abnormality of gait 08/21/2015  . Fracture of left tibia 08/21/2015  . HLD (hyperlipidemia)   . Chronic diastolic congestive heart failure ([place])   . Depression   . Post-operative pain   . Essential hypertension   . Paroxysmal SVT (supraventricular tachycardia) ([place])   . Osteomyelitis of left wrist (HCC)   . History of amputation of lesser toe of right foot ([place])   . Fracture of shaft of left tibia 08/15/2015  . Fracture, tibia, shaft 08/15/2015  . Acute osteomyelitis of right radius ([place])   . Osteomyelitis ([place]) 07/05/2015  . Paroxysmal atrial fibrillation (HCC)   . Wrist osteomyelitis, left ([place]) 06/05/2015  . Foot osteomyelitis, right ([place]) 06/05/2015  . Wound of right ankle 06/05/2015  . Status post amputation of great toe ([place])   . Pain   . Swelling   . Warmth of joint   . Mycotic aneurysm   . Thigh abscess   . Hand abscess   . Septic embolism ([place])   . Gangrene of toe ([place])   . Acute osteomyelitis of left ulna (HCC)   . Streptococcal arthritis of left wrist (HCC)   . Dry gangrene ([place])   . Wound, open, foot with complication   . Acute deep vein thrombosis (DVT) of distal end of right lower extremity ([place])   . Left wrist fracture   . Abscess of bursa, left wrist   . SVT (supraventricular tachycardia) (HCC) 05/08/2015  . 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 Adjustment disorder with depressed mood   . 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 Debility 05/01/2015  . Sepsis ([place]) 04/30/2015  . Acute renal  failure with tubular necrosis ([place])   . Abscess of left hand   . Staphylococcus aureus bacteremia with sepsis ([place])   . Colles' fracture of left radius   . Cellulitis of left upper extremity   . Diastolic dysfunction   . Nondisplaced fracture of distal end of left radius   . Abscess   . Bacteremia due to Staphylococcus   . Pulmonary hypertension ([place])   . Acute blood loss anemia   . 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 DVT, lower extremity ([place])   . Radius distal fracture   . Cellulitis of finger of left hand   . Acute pyelonephritis   . Arm pain, anterior   . Cellulitis   . Bacteremia due to Staphylococcus aureus   . Acute renal failure syndrome ([place])   . Fracture of left radius   . ARF (acute renal failure) ([place]) 04/07/2015  . Severe sepsis ([place]) 04/07/2015  . Cellulitis of left hand 04/07/2015  . Elevated INR 04/07/2015  . Foot swelling 04/07/2015  . Elevated bilirubin 04/07/2015  . Hypokalemia 04/07/2015  . Hypomagnesemia 04/07/2015  . Femoral artery aneurysm, right ([place]) 04/07/2015  . Bacteremia 04/07/2015  . UTI (lower urinary tract infection)   . Chest pain 11/15/2013  . History of duodenal ulcer 11/15/2013  . 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 Septic shock due to abscess 07/13/2013  . Chronic use of  opiate drugs therapeutic purposes 07/12/2013  . Acute renal failure ([place]) 07/11/2013  . Chronic pain syndrome 07/11/2013  . Testosterone deficiency 07/11/2013    Past Medical History:  Past Medical History  Diagnosis Date  . Hypertension   . Chronic knee pain     "both sides"  . Hyperlipemia   . PUD (peptic ulcer disease)   . Peripheral vascular disease ([place])   . Anxiety   . GERD (gastroesophageal reflux disease)   . Wrist osteomyelitis, left ([place]) 06/05/2015  . Foot osteomyelitis, right ([place]) 06/05/2015  . Atrial fibrillation ([place]) dx'd 04/2015  . Chronic bronchitis ([place])     "used to get it q yr; nothing in the last ~ 5 yrs" (08/15/2015)  . Migraine     "q  3 months" (08/15/2015)  . Osteoarthritis of both knees   . Depression    Past Surgical History:  Past Surgical History  Procedure Laterality Date  . I&d extremity Left 07/12/2013    Procedure: I&D Left Thigh Abscess;  Surgeon: Wylene Simmer, MD;  Location: [facility];  Service: Orthopedics;  Laterality: Left;  . Replacement total knee bilateral Bilateral ~ 2007-2013"    "left-right"  . Shoulder arthroscopy w/ rotator cuff repair Bilateral 1992-1993    "right-left"  . Elbow arthroscopy with tendon reconstruction Right 1990  . Femoral artery exploration Right 04/11/2015    Procedure: Resection of infected right femoral artery aneurysm;  Surgeon: Conrad Madrid, MD;  Location: [facility];  Service: Vascular;  Laterality: Right;  . Incision and drainage abscess Right 04/11/2015    Procedure: INCISION AND DRAINAGE OF RIGHT THIGH ABSCESS;  Surgeon: Conrad [place], MD;  Location: [facility];  Service: Vascular;  Laterality: Right;  . Incision and drainage abscess Right 04/13/2015    Procedure: INCISION AND DRAINAGE THIGH ABSCESS;  Surgeon: Conrad [place], MD;  Location: [facility];  Service: Vascular;  Laterality: Right;  . I&d extremity Left 04/17/2015    Procedure: IRRIGATION AND DEBRIDEMENT LEFT EXTREMITY;  Surgeon: Conrad [place], MD;  Location: [facility];  Service: Vascular;  Laterality: Left;  Marland Kitchen Muscle flap closure Right 04/19/2015    Procedure: RIGHT RECTUS ABDOMINUS  FLAP TO RIGHT GROIN;  Surgeon: Irene Limbo, MD;  Location: [facility];  Service: Plastics;  Laterality: Right;  . Femoral revision Right 04/19/2015    Procedure: OVERSEW RIGHT FEMORAL VEIN ;  Surgeon: Angelia Mould, MD;  Location: [facility];  Service: Vascular;  Laterality: Right;  . Wound exploration Right 04/23/2015    Procedure: EXAM UNDER ANESTHESIA AND VAC CHANGE RIGHT THIGH;  Surgeon: Irene Limbo, MD;  Location: [facility];  Service: Plastics;  Laterality: Right;  . Amputation Right 05/17/2015    Procedure: Right 1st and 2nd Toe Amputation;  Surgeon:  Newt Minion, MD;  Location: [facility];  Service: Orthopedics;  Laterality: Right;  . I&d extremity Left 07/09/2015    Procedure: IRRIGATION AND DEBRIDEMENT LEFT WRIST;  Surgeon: Dayna Barker, MD;  Location: [facility];  Service: Plastics;  Laterality: Left;  . I&d extremity Left 08/08/2015    Procedure: IRRIGATION AND DEBRIDEMENT EXTREMITY;  Surgeon: Dayna Barker, MD;  Location: [facility];  Service: Plastics;  Laterality: Left;  . Tonsillectomy  ~ 1972  . Appendectomy  ~ 1980  . Cholecystectomy  ~ 1982  . Inguinal hernia repair Right ~ 1969  . Hemorrhoidectomy with hemorrhoid banding  ~ 1985  . Knee arthroscopy Bilateral 1985-2008    "left-right"  . Joint replacement    .  Uvulopalatoplasty  1991  . Cardiac catheterization  1993    "Dr. Martinique; for vasospasms"  . Orif tibia fracture Left 08/17/2015    Procedure: OPEN REDUCTION INTERNAL FIXATION (ORIF) TIBIA FRACTURE;  Surgeon: Mcarthur Rossetti, MD;  Location: [facility];  Service: Orthopedics;  Laterality: Left;    Assessment & Plan Clinical Impression: DICKY BOER is a 60 y.o. 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 Lives with wife and family. Independent with a cane prior to admission. Two-level home with bedroom upstairs. Patient well known to inpatient rehabilitation services October 2016 for sepsis/multi-medical with multiple irrigation and debridements rectus flap procedure. Presented 08/15/2015 after a fall down some stairs when he tripped. Denied loss of consciousness. X-rays and imaging revealed left distal third comminuted tibia shaft fracture. Underwent ORIF 08/17/2015 per Dr. Ninfa Linden. Nonweightbearing left lower extremity and weightbearing through left elbow only for history of left wrist injury. Hospital course pain management. Most  recent x-rays of left wrist shows diffuse marrow edema throughout the carpus, distal radius distal old. Cortical erosion of the ulnar aspect of distal radius/all. Overall changes similar to appearance from MRI 07/05/2015. Patient currently continues on intravenous vancomycin for recent I&D of left wrist. Patient transferred to CIR on 08/21/2015 .    Patient currently requires overall min to  mod assist with basic self-care skills and IADL secondary to muscle weakness and decreased standing balance, decreased postural control, decreased balance strategies and difficulty maintaining precautions.  Prior to hospitalization, patient could complete ADLs and IADLs at mod I level using w/c and RW prn/appropriately.   Patient will benefit from skilled intervention to increase independence with basic self-care skills prior to discharge home with care partner.  Anticipate patient will require intermittent supervision and additional OT TBD.  OT - End of Session Activity Tolerance: Tolerates 10 - 20 min activity with multiple rests OT Assessment Rehab Potential (ACUTE ONLY): Good Barriers to Discharge:  (none known at this time) OT Patient demonstrates impairments in the following area(s): Balance;Edema;Endurance;Pain;Safety;Skin Integrity OT Basic ADL's Functional Problem(s): Grooming;Bathing;Dressing;Toileting OT Advanced ADL's Functional Problem(s): Simple Meal Preparation OT Transfers Functional Problem(s): Toilet;Tub/Shower OT Additional Impairment(s): Fuctional Use of Upper Extremity (functional trunk strength) OT Plan OT Intensity: Minimum of 1-2 x/day, 45 to 90 minutes OT Frequency: 5 out of 7 days OT Duration/Estimated Length of Stay: 12-14 days OT Treatment/Interventions: Medical illustrator training;Community reintegration;Discharge planning;DME/adaptive equipment instruction;Functional mobility training;Pain management;Patient/family education;Psychosocial support;Self Care/advanced ADL  retraining;Skin care/wound managment;Splinting/orthotics;Therapeutic Activities;Therapeutic Exercise;UE/LE Strength taining/ROM;UE/LE Coordination activities;Wheelchair propulsion/positioning OT Self Feeding Anticipated Outcome(s): independent OT Basic Self-Care Anticipated Outcome(s): supervision to mod I OT Toileting Anticipated Outcome(s): mod I OT Bathroom Transfers Anticipated Outcome(s): mod I OT Recommendation Recommendations for Other Services:  (none at this time) Patient destination: Home Follow Up Recommendations:  (HHOT vs OPOT depending on progress) Equipment Recommended: None recommended by OT (at this time)  Skilled Therapeutic Intervention Pt received seated in w/c with increased pain, see below. Discussed rehab goals and rehab potential, including ELOS. Discussed toilet transfer and safest & most effective way to perform toilet transfers while adhering to NWB precautions. Pt is overall min to mod assist with ADLs and functional transfers, squat pivot transfers during OT eval were completed due to patient's increased pain after PT eval earlier. Talked with patient about pain management and goals to stand for LB ADLs. Pt encouraged  and eager to regain his functional strength and independence. Discussed patient not doffing left wrist splint until further clarification from doctor. Did administer graded foam block for functional grasp and fine motor strengthening. At end of session, left patient supine in bed with LLE and LUE elevated on pillows. Bed alarm not set, notified RN of this.  OT Evaluation Precautions/Restrictions  Precautions Precautions: Fall Precaution Comments: Right toe amputations, increased pain in right knee with mobility and in standing  Restrictions Weight Bearing Restrictions: Yes LUE Weight Bearing: Weight bear through elbow only LLE Weight Bearing: Non weight bearing   General Chart Reviewed: Yes Family/Caregiver Present: No   Vital Signs Therapy  Vitals Pulse Rate: 98 BP: 123/75 mmHg (after w/c mobility) Patient Position (if appropriate): Sitting Oxygen Therapy SpO2: 99 % O2 Device: Not Delivered   Pain Pain Assessment Pain Assessment: 0-10 Pain Score: 7  Pain Type: Surgical pain Pain Location: Leg Pain Orientation: Lower;Left Pain Onset: On-going Pain Intervention(s): RN made aware;Repositioned Multiple Pain Sites: Yes 2nd Pain Site Pain Score: 7 Pain Type: Chronic pain Pain Location: Knee Pain Orientation: Right Pain Descriptors / Indicators: Sharp Pain Frequency: Constant (when moving it) Pain Onset: On-going Pain Intervention(s): RN made aware;Repositioned   Home Living/Prior Functioning Home Living Available Help at Discharge: Family, Available 24 hours/day (wife and children) Type of Home: House Home Access: Stairs to enter [employer] of Steps: 5 through garage; 3 through front with a ramp Entrance Stairs-Rails: Right, Left, Can reach both Home Layout: Two level, 1/2 bath on main level, Bed/bath upstairs Alternate Level Stairs-Number of Steps: Flight Alternate Level Stairs-Rails: Right, Left, Can reach both Bathroom Shower/Tub: Tub/shower unit, Architectural technologist: Standard Bathroom Accessibility: Yes Additional Comments: Pt reports he has a tub transfer bench and BSC for use at home. He actively uses both. Pt also reports he has grab bars on left side of shower.   Lives With: Spouse IADL History Homemaking Responsibilities: Yes Meal Prep Responsibility: Secondary Laundry Responsibility: No Cleaning Responsibility: No Shopping Responsibility: No Child Care Responsibility: No Homemaking Comments: wife with COPD and limited, pt's sons do assist with IADLs Mode of Transportation: Car, Family (pt states he does not currently drive.) Occupation: On disability (court date for disability in April) IADL Comments: Pt reports he normally cooks his own breakfast (standing prn leaving w/c behind  him, per his report)  Prior Function Level of Independence: Needs assistance with ADLs, Requires assistive device for independence Bath: Supervision/set-up  Able to Take Stairs?: Yes Driving: No Vocation: On disability (has applied for disability) Comments: Used to love playing golf and basketball; likes to watch sports on TV.  Is now on disability, former [employer] officer   ADL See FIM/Function for more information  Vision/Perception  Vision- History Baseline Vision/History: Wears glasses Wears Glasses: Reading only Patient Visual Report: No change from baseline Vision- Assessment Vision Assessment?: No apparent visual deficits   Cognition Overall Cognitive Status: Within Functional Limits for tasks assessed Arousal/Alertness: Awake/alert Memory: Appears intact Awareness: Appears intact Problem Solving: Appears intact Safety/Judgment: Appears intact   Sensation Sensation Light Touch: Impaired by gross assessment (left fingers, pt reports numbness/tingling and "feels funny" when being touched) Light Touch Impaired Details: Impaired LUE Proprioception: Appears Intact Coordination Gross Motor Movements are Fluid and Coordinated: Yes (decreased strength in left hand which somewhat limits these movements) Fine Motor Movements are Fluid and Coordinated: Yes (decreased strength in left hand which somewhat limits these movements)   Trunk/Postural Assessment  Cervical Assessment Cervical Assessment: Within Functional Limits  Thoracic Assessment Thoracic Assessment: Within Functional Limits Lumbar Assessment Lumbar Assessment: Within Functional Limits Postural Control Postural Control: Deficits on evaluation Postural Limitations: rounded trunk esp in thoracic spine, forward head   Balance - Per PT evaluation Balance Balance Assessed: Yes Static Sitting Balance Static Sitting - Balance Support: No upper extremity supported;Feet supported Static Sitting - Level of  Assistance: 6: Modified independent (Device/Increase time) Dynamic Sitting Balance Dynamic Sitting - Balance Support: Feet supported;During functional activity Dynamic Sitting - Level of Assistance: 4: Min assist Sitting balance - Comments: due to NWB LLE Static Standing Balance Static Standing - Balance Support: Bilateral upper extremity supported;Right upper extremity supported Static Standing - Level of Assistance: 4: Min assist   Extremity/Trunk Assessment RUE Assessment RUE Assessment: Within Functional Limits (rotator cuff surgery in RUE ~20 years ago) LUE Assessment LUE Assessment: Exceptions to WFL LUE AROM (degrees) LUE Overall AROM Comments: limited shoulder flexion (~90*) due to h/o rotator cuff surgery~20 years ago. Pt reports this shoulder has been limited for ~6 months to 1 year LUE PROM (degrees) LUE Overall PROM Comments: WFL to ~120*, then pt with increased pain LUE Strength LUE Overall Strength Comments: shoulder strength grossly 3+/5, elbow strength grossly 4/5   See Function Navigator for Current Functional Status.   Refer to Care Plan for Long Term Goals  Recommendations for other services: None at this time  Discharge Criteria: Patient will be discharged from OT if patient refuses treatment 3 consecutive times without medical reason, if treatment goals not met, if there is a change in medical status, if patient makes no progress towards goals or if patient is discharged from hospital.  The above assessment, treatment plan, treatment alternatives and goals were discussed and mutually agreed upon: by patient  Chrys Racer , MS, OTR/L, CLT Pager: [phone]  08/22/2015, 12:48 PM

## 2015-08-22 NOTE — Plan of Care (Signed)
Problem: RH SKIN INTEGRITY Goal: RH STG SKIN FREE OF INFECTION/BREAKDOWN Outcome: Progressing Incision in healing process at d/c.

## 2015-08-22 NOTE — Progress Notes (Signed)
[facility] PHYSICAL MEDICINE & REHABILITATION     PROGRESS NOTE    Subjective/Complaints: Had a reasonable night. Able to sleep. Pain under control. Concerned whether his pain medications are the same as what he was using at home.   ROS: Pt denies fever, rash/itching, headache, blurred or double vision, nausea, vomiting, abdominal pain, diarrhea, chest pain, shortness of breath, palpitations, dysuria, dizziness, neck or back pain, bleeding, anxiety, or depression   Objective: Vital Signs: Blood pressure 123/75, pulse 98, temperature 98.5 F (36.9 C), temperature source Oral, resp. rate 18, height  (1.905 m), weight 107.1 kg (236 lb 1.8 oz), SpO2 99 %. No results found.  Recent Labs  08/20/15 0515 08/22/15 0507  WBC 6.9 9.3  HGB 9.9* 10.7*  HCT 31.0* 33.7*  PLT 218 321    Recent Labs  08/22/15 0507  NA 137  K 3.8  CL 100*  GLUCOSE 117*  BUN 11  CREATININE 1.00  CALCIUM 9.0   CBG (last 3)  No results for input(s): GLUCAP in the last 72 hours.  Wt Readings from Last 3 Encounters:  08/21/15 107.1 kg (236 lb 1.8 oz)  08/18/15 100.6 kg (221 lb 12.5 oz)  08/04/15 99.791 kg (220 lb)    Physical Exam:  Constitutional: He is oriented to person, place, and time. He appears well-developed and well-nourished.  HENT:  Head: Normocephalic and atraumatic.  Eyes: Conjunctivae and EOM are normal.  Neck: Normal range of motion. Neck supple. No thyromegaly present.  Cardiovascular: Normal rate and regular rhythm.  Respiratory: Effort normal and breath sounds normal. No respiratory distress.  GI: Soft. Bowel sounds are normal. He exhibits no distension.  Musculoskeletal: He exhibits edema and tenderness.  Neurological: He is alert and oriented to person, place, and time.  Motor: RUE 5/5 proximal to distal LUE 4/5 shoulder abduction, 4-/5 elbow flexion/extension, 4/5 hand grip RLE: hip flexion, knee extension 4+/5, ankle dorsi/plantar flexion 4+/5 LLE: hip flexion  4/5, ankle dorsi/plantar flexion 4+/5  Sensation diminished symetrically distal to knees  Skin: Skin is warm and dry. Old right first toe amp Left upper extremity with dressing in place. Wrist remains tender. Wearing splint  Left lower extremity surgical dressing in place . Incision clean and intact without drainage. Psychiatric: His speech is normal. Judgment normal. Cognition and memory are normal   Assessment/Plan: 1. Mobility/functional deficits secondary to left tibia fracture/osteomyelitis which require 3+ hours per day of interdisciplinary therapy in a comprehensive inpatient rehab setting. Physiatrist is providing close team supervision and 24 hour management of active medical problems listed below. Physiatrist and rehab team continue to assess barriers to discharge/monitor patient progress toward functional and medical goals.  Function:  Bathing Bathing position      Bathing parts      Bathing assist        Upper Body Dressing/Undressing Upper body dressing                    Upper body assist        Lower Body Dressing/Undressing Lower body dressing                                  Lower body assist        Toileting Toileting          Toileting assist     Transfers Chair/bed transfer  Secondary school teacher Comprehension    Expression    Social Interaction    Problem Solving    Memory    Medical Problem List and Plan: 1. Left distal third comminuted tibia shaft fracture secondary to fall. Status post ORIF 08/17/2015. Nonweightbearing 6 weeks  -begin CIR therapies 2. DVT Prophylaxis/Anticoagulation: Eliquis for history of SVT. 3. Pain Management/chronic pain management: MS Contin 90 mg every 12 hours, Neurontin 100 mg 3 times a day, Cymbalta 60 mg daily, Robaxin and oxycodone  q6 prn----may need to schedule this too  -follow for pain tolerance with  therapies 4. Mood: Effexor 75 mg daily. 5. Neuropsych: This patient is capable of making decisions on his own behalf. 6. Skin/Wound Care: Routine skin checks. Wounds clean/dressed 7. Fluids/Electrolytes/Nutrition: I personally reviewed the patient's labs today. All essentially are normal. 8. Chronic left wrist osteomyelitis/MRSA. Status post recent I&D per Dr.Coley and maintain on intravenous vancomycin prior to admission. Will confirm duration of antibiotics. Weightbearing as tolerated to left elbow only  -wbc/hgb are stable to improved. All labs personally reviewed 9. History of SVT. Cardiac rate controlled. Continue Tambocor 100 mg every 12 hours, Lopressor 75 mg twice a day 10. Chronic diastolic congestive heart failure. Monitor for any signs of fluid overload 11. Hyperlipidemia. Lipitor 12. History of toe amputation on right: Cont education with b/l LE deficits  LOS (Days) 1 A FACE TO FACE EVALUATION WAS PERFORMED  SWARTZ,ZACHARY T 08/22/2015 10:53 AM

## 2015-08-23 ENCOUNTER — Inpatient Hospital Stay (HOSPITAL_COMMUNITY): Payer: Medicaid Other

## 2015-08-23 ENCOUNTER — Inpatient Hospital Stay (HOSPITAL_COMMUNITY): Payer: Medicaid Other | Admitting: Occupational Therapy

## 2015-08-23 MED ORDER — OXYCODONE HCL 5 MG PO TABS
30.0000 mg | ORAL_TABLET | ORAL | Status: DC | PRN
  Administered 2015-08-23 – 2015-08-31 (×40): 30 mg via ORAL
  Filled 2015-08-23 (×40): qty 6

## 2015-08-23 NOTE — IPOC Note (Signed)
Overall Plan of Care [facility]) Patient Details Name: [name] MRN: [id number] DOB: Dec 04, 1955  Admitting Diagnosis: L tibia fx  Hospital Problems: Active Problems:   Fracture of shaft of left tibia   Post-operative pain   Essential hypertension   Paroxysmal SVT (supraventricular tachycardia) (HCC)   Osteomyelitis of left wrist (HCC)   History of amputation of lesser toe of right foot (HCC)   Abnormality of gait   HLD (hyperlipidemia)   Chronic diastolic congestive heart failure (HCC)   Depression   Fracture of left tibia     Functional Problem List: Nursing Endurance, Medication Management, Pain, Perception, Safety, Skin Integrity, Sensory  PT Balance, Endurance, Pain, Safety, Skin Integrity  OT Balance, Edema, Endurance, Pain, Safety, Skin Integrity  SLP    TR         Basic ADL's: OT Grooming, Bathing, Dressing, Toileting     Advanced  ADL's: OT Simple Meal Preparation     Transfers: PT Bed Mobility, Bed to Chair, Car, Occupational psychologist, Research scientist (life sciences): PT Psychologist, prison and probation services, Ambulation     Additional Impairments: OT Fuctional Use of Upper Extremity (functional trunk strength)  SLP        TR      Anticipated Outcomes Item Anticipated Outcome  Self Feeding independent  Swallowing      Basic self-care  supervision to mod I  Toileting  mod I   Bathroom Transfers mod I  Bowel/Bladder  manage bowel and bladder mod I  Transfers  S  Locomotion  mod I w/c; min A short household ambulation  Communication     Cognition     Pain  8 or less  Safety/Judgment  mod assist   Therapy Plan: PT Intensity: Minimum of 1-2 x/day ,45 to 90 minutes PT Frequency: 5 out of 7 days PT Duration Estimated Length of Stay: 2 weeks OT Intensity: Minimum of 1-2 x/day, 45 to 90 minutes OT Frequency: 5 out of 7 days OT Duration/Estimated Length of Stay: 12-14 days         Team Interventions: Nursing Interventions Patient/Family Education, Pain  Management, Disease Management/Prevention, Medication Management, Skin Care/Wound Management  PT interventions Ambulation/gait training, Pain management, Balance/vestibular training, DME/adaptive equipment instruction, Patient/family education, Therapeutic Activities, Wheelchair propulsion/positioning, Therapeutic Exercise, Functional mobility training, UE/LE Strength taining/ROM, Discharge planning, Skin care/wound management  OT Interventions Balance/vestibular training, Community reintegration, Discharge planning, DME/adaptive equipment instruction, Functional mobility training, Pain management, Patient/family education, Psychosocial support, Self Care/advanced ADL retraining, Skin care/wound managment, Splinting/orthotics, Therapeutic Activities, Therapeutic Exercise, UE/LE Strength taining/ROM, UE/LE Coordination activities, Wheelchair propulsion/positioning  SLP Interventions    TR Interventions    SW/CM Interventions Discharge Planning, Psychosocial Support, Patient/Family Education    Team Discharge Planning: Destination: PT-Home ,OT- Home , SLP-  Projected Follow-up: PT-Home health PT, OT-   (HHOT vs OPOT depending on progress), SLP-  Projected Equipment Needs: PT-None recommended by PT, OT- None recommended by OT (at this time), SLP-  Equipment Details: PT- , OT-  Patient/family involved in discharge planning: PT- Patient,  OT-Patient, SLP-   MD ELOS: 12-14 days Medical Rehab Prognosis:  Excellent Assessment: The patient has been admitted for CIR therapies with the diagnosis of left tibia fracture, osteomyelitis left wrist. The team will be addressing functional mobility, strength, stamina, balance, safety, adaptive techniques and equipment, self-care, bowel and bladder mgt, patient and caregiver education, pain mgt, ortho precautions, wound care, community reintegration, ego support, orthotics. Goals have been set at mod I to supervision for  transfers, locomotions, ADL's and self-care.     Ranelle Oyster, MD, FAAPMR      See Team Conference Notes for weekly updates to the plan of care

## 2015-08-23 NOTE — Progress Notes (Signed)
[facility]IL[facility]     PROGRESS NOTE    Subjective/Complaints: Asked if he could have his oxycodone  IR more often. Left wrist has been quite sore.  ROS: Pt denies fever, rash/itching, headache, blurred or double vision, nausea, vomiting, abdominal pain, diarrhea, chest pain, shortness of breath, palpitations, dysuria, dizziness, neck or back pain, bleeding, anxiety, or depression   Objective: Vital Signs: Blood pressure 107/64, pulse 73, temperature 98 F (36.7 C), temperature source Oral, resp. rate 18, height  (1.905 m), weight 107.1 kg (236 lb 1.8 oz), SpO2 98 %. No results found.  Recent Labs  08/22/15 0507  WBC 9.3  HGB 10.7*  HCT 33.7*  PLT 321    Recent Labs  08/22/15 0507  NA 137  K 3.8  CL 100*  GLUCOSE 117*  BUN 11  CREATININE 1.00  CALCIUM 9.0   CBG (last 3)  No results for input(s): GLUCAP in the last 72 hours.  Wt Readings from Last 3 Encounters:  08/21/15 107.1 kg (236 lb 1.8 oz)  08/18/15 100.6 kg (221 lb 12.5 oz)  08/04/15 99.791 kg (220 lb)    Physical Exam:  Constitutional: He is oriented to person, place, and time. He appears well-developed and well-nourished.  HENT:  Head: Normocephalic and atraumatic.  Eyes: Conjunctivae and EOM are normal.  Neck: Normal range of motion. Neck supple. No thyromegaly present.  Cardiovascular: Normal rate and regular rhythm.  Respiratory: Effort normal and breath sounds normal. No respiratory distress.  GI: Soft. Bowel sounds are normal. He exhibits no distension.  Musculoskeletal: He exhibits edema and tenderness.  Neurological: He is alert and oriented to person, place, and time.  Motor: RUE 5/5 proximal to distal LUE 4/5 shoulder abduction, 4-/5 elbow flexion/extension, 4/5 hand grip RLE: hip flexion, knee extension 4+/5, ankle dorsi/plantar flexion 4+/5 LLE: hip flexion 4/5, ankle dorsi/plantar flexion 4+/5  Sensation diminished symetrically distal to  knees  Skin: Skin is warm and dry. Old right first toe amp Left upper extremity with dressing in place. Wrist remains tender. Wearing splint  Left lower extremity surgical dressing in place . Incision clean and intact without drainage. Psychiatric: His speech is normal. Judgment normal. Cognition and memory are normal   Assessment/Plan: 1. Mobility/functional deficits secondary to left tibia fracture/osteomyelitis which require 3+ hours per day of interdisciplinary therapy in a comprehensive inpatient rehab setting. Physiatrist is providing close team supervision and 24 hour management of active medical problems listed below. Physiatrist and rehab team continue to assess barriers to discharge/monitor patient progress toward functional and medical goals.  Function:  Bathing Bathing position      Bathing parts      Bathing assist        Upper Body Dressing/Undressing Upper body dressing   What is the patient wearing?: Pull over shirt/dress     Pull over shirt/dress - Perfomed by patient: Thread/unthread right sleeve, Put head through opening Pull over shirt/dress - Perfomed by helper: Thread/unthread left sleeve, Pull shirt over trunk        Upper body assist     Set up : To obtain clothing/put away  Lower Body Dressing/Undressing Lower body dressing   What is the patient wearing?: AFO Underwear - Performed by patient: Thread/unthread right underwear leg Underwear - Performed by helper: Thread/unthread left underwear leg, Pull underwear up/down Pants- Performed by patient: Thread/unthread right pants leg Pants- Performed by helper: Thread/unthread left pants leg, Pull pants up/down     Socks - Performed  by patient: Don/doff right sock Socks - Performed by helper: Don/doff left sock Shoes - Performed by patient: Don/doff right shoe, Fasten right            Lower body assist Assist for lower body dressing: More than reasonable time      Toileting Toileting  Toileting activity did not occur:  (did not occur during eval)        Toileting assist     Transfers Chair/bed transfer   Chair/bed transfer method: Squat pivot Chair/bed transfer assist level: Moderate assist (Pt 50 - 74%/lift or lower) Chair/bed transfer assistive device: Walker, Armrests, Orthosis     Locomotion Ambulation     Max distance: 3 Assist level: Moderate assist (Pt 50 - 74%)   Wheelchair   Type: Manual Max wheelchair distance: 150 Assist Level: Supervision or verbal cues  Cognition Comprehension Comprehension assist level: Follows complex conversation/direction with no assist  Expression Expression assist level: Expresses complex ideas: With no assist  Social Interaction Social Interaction assist level: Interacts appropriately with others with medication or extra time (anti-anxiety, antidepressant).  Problem Solving Problem solving assist level: Solves complex problems: Recognizes & self-corrects  Memory Memory assist level: Complete Independence: No helper  Medical Problem List and Plan: 1. Left distal third comminuted tibia shaft fracture secondary to fall. Status post ORIF 08/17/2015. Nonweightbearing 6 weeks  -continue CIR therapies  -left wrist remains inhibiting but he's working through the pain 2. DVT Prophylaxis/Anticoagulation: Eliquis for history of SVT. 3. Pain Management/chronic pain management: MS Contin 90 mg every 12 hours, Neurontin 100 mg 3 times a day, Cymbalta 60 mg daily, Robaxin and oxycodone  q4 prn----may have up to 5 x per day  -follow for pain tolerance with therapies 4. Mood: Effexor 75 mg daily. 5. Neuropsych: This patient is capable of making decisions on his own behalf. 6. Skin/Wound Care: Routine skin checks. Wounds clean/continue current dressing 7. Fluids/Electrolytes/Nutrition: I personally reviewed the patient's labs today. All essentially are normal. 8. Chronic left wrist osteomyelitis/MRSA. Status post recent I&D per  Dr.Coley and maintain on intravenous vancomycin prior to admission. Will confirm duration of antibiotics. Weightbearing as tolerated to left elbow only  -wbc/hgb are stable to improved. 9. History of SVT. Cardiac rate controlled. Continue Tambocor 100 mg every 12 hours, Lopressor 75 mg twice a day 10. Chronic diastolic congestive heart failure. Daily weights. I's and O's negative so far 11. Hyperlipidemia. Lipitor 12. History of toe amputation on right: Cont education with b/l LE deficits  LOS (Days) 2 A FACE TO FACE EVALUATION WAS PERFORMED  SWARTZ,ZACHARY T 08/23/2015 10:49 AM

## 2015-08-23 NOTE — Progress Notes (Signed)
Occupational Therapy Session Note  Patient Details  Name: [name] MRN: [id number] Date of Birth: 1956-05-02  Today's Date: 08/23/2015 OT Individual Time: 1345-1430 OT Individual Time Calculation (min): 45 min    Short Term Goals: Week 1:  OT Short Term Goal 1 (Week 1): Pt will be min assist with LB ADLs using AE prn OT Short Term Goal 2 (Week 1): Pt will be educated on a UB HEP for BUEs and trunk OT Short Term Goal 3 (Week 1): Pt will be supervision with toilet transfers, no cueing needed for correct position of w/c or body during transfers OT Short Term Goal 4 (Week 1): Pt will be min assist for simple meal prep performing sit to/from stands prn/appropriately   Skilled Therapeutic Interventions/Progress Updates:    Pt seen for OT tx session focusing on functional mobility/ transfers, and UE strengthening. Pt sitting up in w/c upon arrival, voicing fatigue from previous session, however, agreeable to tx session. He voiced difficulty using in-room bathroom as grab bars were placed too high for him to be able to get full leverage. OT placed BSC over toilet and adjusted height so pt could use arm rests of BSC to push up from. He completed toileting task and transfers, requiring steadying assist for dynamic standing tasks and assist for sit <> stand.  Pt self propelled w/c to therapy gym. Educated/ demonstrated theraband and weighted dowel rod exercises. Pt unable to maintain functional grasp on theraband due to impaired R UE, able to hold dowel rod. Pt completed UE exercises to all major UE muscle groups with cues for form and technique. Educated regarding importance of proper form and to take rest breaks when needed. All exercises completed x2 sets of 10 reps.  Pt returned to room at end of session, Mod A stand pivot completed with PFRW. Pt left in supine, all needs in reach.   Therapy Documentation Precautions:  Precautions Precautions: Fall Precaution Comments: Right toe amputations,  increased pain in right knee with mobility and in standing  Restrictions Weight Bearing Restrictions: Yes LUE Weight Bearing: Weight bear through elbow only LLE Weight Bearing: Non weight bearing Pain: Pain Assessment Pain Score: 3  Pain Type: Chronic pain Pain Location: Leg Pain Orientation: Left Pain Descriptors / Indicators: Aching Pain Intervention(s): Repositioned;Rest  See Function Navigator for Current Functional Status.   Therapy/Group: Individual Therapy  [name], Makylee Sanborn C 08/23/2015, 3:13 PM

## 2015-08-23 NOTE — Progress Notes (Signed)
Occupational Therapy Session Note  Patient Details  Name: [name] MRN: [id number] Date of Birth: 1956/05/30  Today's Date: 08/23/2015 OT Individual Time: 1000-1100 OT Individual Time Calculation (min): 60 min    Short Term Goals: Week 1:  OT Short Term Goal 1 (Week 1): Pt will be min assist with LB ADLs using AE prn OT Short Term Goal 2 (Week 1): Pt will be educated on a UB HEP for BUEs and trunk OT Short Term Goal 3 (Week 1): Pt will be supervision with toilet transfers, no cueing needed for correct position of w/[name] or body during transfers OT Short Term Goal 4 (Week 1): Pt will be min assist for simple meal prep performing sit to/from stands prn/appropriately   Skilled Therapeutic Interventions/Progress Updates:    Pt seen for OT ADL bathing and dressing session. Pt sitting up in w/[name] upon arrival with RN present, voicing fatigue, however, agreeable to tx session. He bathed seated in w/[name] at sink. Washed UB with set-up. He stood at PFRW to complete buttock hygiene and pericare with steadying assist. HE required mod A to stand and steadying assist to maintain dynamic standing balance while he pulled pants up. Pt attempted to stand without use of PFRW, however, realized he was unable to do so while maintaining L NWB status. Pt required rest breaks throughout bathing and dressing session due to decreased functional activity tolerance. Pt completed 2x sit <> stand at PFRW with emphasis on maintaining balance with only one UE support and on controlled descent. He required steadying assist without B UE stabilization. On first trial pt required mod A for controlled descent, and progressed to CGA for second trial.  Pt left sitting in w/[name] at end of session, all needs in reach.  Discussed with pt home set-up/ layout and d/[name] planning needs.   Therapy Documentation Precautions:  Precautions Precautions: Fall Precaution Comments: Right toe amputations, increased pain in right knee with mobility and in  standing  Restrictions Weight Bearing Restrictions: Yes LUE Weight Bearing: Weight bear through elbow only LLE Weight Bearing: Non weight bearing Pain: Pain Assessment Pain Assessment: 0-10 Pain Score: 3  Pain Type: Chronic pain Pain Location: Back Pain Orientation: Lower Pain Descriptors / Indicators: Aching Pain Onset: Gradual Patients Stated Pain Goal: 3 Pain Intervention(s): Repositioned (Robaxin) Multiple Pain Sites: No  See Function Navigator for Current Functional Status.   Therapy/Group: Individual Therapy  [name] 08/23/2015, 7:20 AM

## 2015-08-23 NOTE — Progress Notes (Signed)
Occupational Therapy Session Note  Patient Details  Name: [name] MRN: [id number] Date of Birth: 06-21-1956  Today's Date: 08/23/2015 OT Individual Time: 1300-1330 OT Individual Time Calculation (min): 30 min   Skilled Therapeutic Interventions/Progress Updates:    Pt completed wheelchair mobility to and from the gym during session with modified independence.  Worked on sit to stand transitions from EOB with RW with platform for support with standing balance.  Mod assist needed for sit to stand from mat with pt tolerating standing for 2 mins while engaged in UE activity with both the right and left UEs.  He was able to maintain NWBing for sit to stand transitions by crossing the LLE on the right foot to avoid pushing with it.  Stand pivot transfers to the mat and wheelchair during session with mod assist as well.    Therapy Documentation Precautions:  Precautions Precautions: Fall Precaution Comments: Right toe amputations, increased pain in right knee with mobility and in standing  Restrictions Weight Bearing Restrictions: Yes LUE Weight Bearing: Weight bear through elbow only LLE Weight Bearing: Non weight bearing  Vital Signs: Therapy Vitals Temp: 98 F (36.7 C) Temp Source: Oral Pulse Rate: 85 Resp: 18 BP: 115/69 mmHg Patient Position (if appropriate): Lying Oxygen Therapy SpO2: 97 % O2 Device: Not Delivered Pain: Pain Assessment Pain Assessment: Faces Pain Score: 3  Faces Pain Scale: Hurts little more Pain Type: Chronic pain Pain Location: Leg Pain Orientation: Left Pain Descriptors / Indicators: Aching Pain Intervention(s): Repositioned ADL: See Function Navigator for Current Functional Status.   Therapy/Group: Individual Therapy  Nitya Cauthon OTR/L 08/23/2015, 3:38 PM

## 2015-08-23 NOTE — Progress Notes (Signed)
Physical Therapy Session Note  Patient Details  Name: [name] MRN: [id number] Date of Birth: 20-Jan-1956  Today's Date: 08/23/2015 PT Individual Time: 0800-0900 PT Individual Time Calculation (min): 60 min   Short Term Goals: Week 1:  PT Short Term Goal 1 (Week 1): Patient will consistently perform pivot transfers with min A PT Short Term Goal 2 (Week 1): Patient will perform sit to stand min A. PT Short Term Goal 3 (Week 1): dynamic standing balance for 3 minutes with min guard A during functional activity PT Short Term Goal 4 (Week 1): Ambulate 10' min A w/ whelchair following PT Short Term Goal 5 (Week 1): mod I w/c mobility in controlled environment  Skilled Therapeutic Interventions/Progress Updates:   Discussion and education in regards to home set-up and accessibility. Pt donned pants indpendently at bed level with lateral leans to pull up pants. Performed mod assist stand pivot with PFRW from bed to w/c with mod assist for initial sit to stand and min assist for balance once up with cues for controlling descent into the w/c. Noticed that pt does not have ant-tippers on personal w/c, and reports they broke off. Added to w/c for safety and educated and demonstrated on technique of how to move them out of the way when performing bump up in w/c. Min assist squat/scoot pivot transfer to mat with min assist for trunk support due to becoming off balance with posterior LOB. Supine therex for BLE strengthening and ROM including heel slides, hip abduction, and SLR x 10-15 reps each. Rest breaks as needed. Sit to stands from mat with min A and RW with functional standing balance x 2 min to increase upright standing tolerance. Returned to room with all needs in reach.   Therapy Documentation Precautions:  Precautions Precautions: Fall Precaution Comments: Right toe amputations, increased pain in right knee with mobility and in standing  Restrictions Weight Bearing Restrictions: Yes LUE Weight  Bearing: Weight bear through elbow only LLE Weight Bearing: Non weight bearing   Pain: Premedicated for pain in LLE.  See Function Navigator for Current Functional Status.   Therapy/Group: Individual Therapy  Karolee Stamps Darrol Poke, PT, DPT  08/23/2015, 10:11 AM

## 2015-08-23 NOTE — Progress Notes (Signed)
[facility] Individual Statement of Services  Patient Name:  [name]  Date:  08/23/2015  Welcome to the [facility].  Our goal is to provide you with an individualized program based on your diagnosis and situation, designed to meet your specific needs.  With this comprehensive rehabilitation program, you will be expected to participate in at least 3 hours of rehabilitation therapies Monday-Friday, with modified therapy programming on the weekends.  Your rehabilitation program will include the following services:  Physical Therapy (PT), Occupational Therapy (OT), 24 hour per day rehabilitation nursing, Case Management (Social Worker), Rehabilitation Medicine, Nutrition Services and Pharmacy Services  Weekly team conferences will be held on Tuesdays to discuss your progress.  Your Social Worker will talk with you frequently to get your input and to update you on team discussions.  Team conferences with you and your family in attendance may also be held.  Expected length of stay:  12 to 14 days  Overall anticipated outcome:  Supervision with minimal assistance for walking  Depending on your progress and recovery, your program may change. Your Social Worker will coordinate services and will keep you informed of any changes. Your Social Worker's name and contact numbers are listed  below.  The following services may also be recommended but are not provided by the [facility]:   Driving Evaluations  Home Health Rehabiltiation Services  Outpatient Rehabilitation Services   Arrangements will be made to provide these services after discharge if needed.  Arrangements include referral to agencies that provide these services.  Your insurance has been verified to be:  Medicaid Your primary doctor is:  Dr. [name]  Pertinent information will be shared with your doctor and your insurance company.  Social Worker:  Staci Acosta, LCSW   [phone] or ([phone]  Information discussed with and copy given to patient by: Elvera Lennox, 08/23/2015, 12:27 PM

## 2015-08-23 NOTE — Plan of Care (Signed)
Pt's goals modified. See POC for goal details. Johnsie Cancel, OTR/L

## 2015-08-23 NOTE — Progress Notes (Signed)
Social Work Assessment and Plan  Patient Details  Name: [name] MRN: [id number] Date of Birth: 1955-12-14  Today's Date: 08/23/2015  Problem List:  Patient Active Problem List   Diagnosis Date Noted  . Abnormality of gait 08/21/2015  . Fracture of left tibia 08/21/2015  . HLD (hyperlipidemia)   . Chronic diastolic congestive heart failure ([place])   . Depression   . Post-operative pain   . Essential hypertension   . Paroxysmal SVT (supraventricular tachycardia) ([place])   . Osteomyelitis of left wrist (HCC)   . History of amputation of lesser toe of right foot ([place])   . Fracture of shaft of left tibia 08/15/2015  . Fracture, tibia, shaft 08/15/2015  . Acute osteomyelitis of right radius ([place])   . Osteomyelitis ([place]) 07/05/2015  . Paroxysmal atrial fibrillation (HCC)   . Wrist osteomyelitis, left ([place]) 06/05/2015  . Foot osteomyelitis, right ([place]) 06/05/2015  . Wound of right ankle 06/05/2015  . Status post amputation of great toe ([place])   . Pain   . Swelling   . Warmth of joint   . Mycotic aneurysm   . Thigh abscess   . Hand abscess   . Septic embolism ([place])   . Gangrene of toe ([place])   . Acute osteomyelitis of left ulna (HCC)   . Streptococcal arthritis of left wrist (HCC)   . Dry gangrene ([place])   . Wound, open, foot with complication   . Acute deep vein thrombosis (DVT) of distal end of right lower extremity ([place])   . Left wrist fracture   . Abscess of bursa, left wrist   . SVT (supraventricular tachycardia) (HCC) 05/08/2015  . 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 Adjustment disorder with depressed mood   . 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 Debility 05/01/2015  . Sepsis ([place]) 04/30/2015  . Acute renal failure with tubular necrosis ([place])   . Abscess of left hand   . Staphylococcus aureus bacteremia with sepsis ([place])   . Colles' fracture of left radius   . Cellulitis of left upper extremity   . Diastolic dysfunction   . Nondisplaced  fracture of distal end of left radius   . Abscess   . Bacteremia due to Staphylococcus   . Pulmonary hypertension ([place])   . Acute blood loss anemia   . 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 DVT, lower extremity ([place])   . Radius distal fracture   . Cellulitis of finger of left hand   . Acute pyelonephritis   . Arm pain, anterior   . Cellulitis   . Bacteremia due to Staphylococcus aureus   . Acute renal failure syndrome ([place])   . Fracture of left radius   . ARF (acute renal failure) ([place]) 04/07/2015  . Severe sepsis ([place]) 04/07/2015  . Cellulitis of left hand 04/07/2015  . Elevated INR 04/07/2015  . Foot swelling 04/07/2015  . Elevated bilirubin 04/07/2015  . Hypokalemia 04/07/2015  . Hypomagnesemia 04/07/2015  . Femoral artery aneurysm, right ([place]) 04/07/2015  . Bacteremia 04/07/2015  . UTI (lower urinary tract infection)   . Chest pain 11/15/2013  . History of duodenal ulcer 11/15/2013  . 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 Septic shock due to abscess 07/13/2013  . Chronic use of opiate drugs therapeutic purposes 07/12/2013  . Acute renal failure ([place]) 07/11/2013  . Chronic pain syndrome 07/11/2013  . Testosterone deficiency 07/11/2013   Past Medical History:  Past Medical History  Diagnosis Date  .  Hypertension   . Chronic knee pain     "both sides"  . Hyperlipemia   . PUD (peptic ulcer disease)   . Peripheral vascular disease ([place])   . Anxiety   . GERD (gastroesophageal reflux disease)   . Wrist osteomyelitis, left ([place]) 06/05/2015  . Foot osteomyelitis, right ([place]) 06/05/2015  . Atrial fibrillation ([place]) dx'd 04/2015  . Chronic bronchitis ([place])     "used to get it q yr; nothing in the last ~ 5 yrs" (08/15/2015)  . Migraine     "q 3 months" (08/15/2015)  . Osteoarthritis of both knees   . Depression    Past Surgical History:  Past Surgical History  Procedure Laterality Date  . I&d extremity Left 07/12/2013    Procedure: I&D Left Thigh Abscess;  Surgeon: Wylene Simmer, MD;  Location: [facility];  Service: Orthopedics;  Laterality: Left;  . Replacement total knee bilateral Bilateral ~ 2007-2013"    "left-right"  . Shoulder arthroscopy w/ rotator cuff repair Bilateral 1992-1993    "right-left"  . Elbow arthroscopy with tendon reconstruction Right 1990  . Femoral artery exploration Right 04/11/2015    Procedure: Resection of infected right femoral artery aneurysm;  Surgeon: Conrad Gurabo, MD;  Location: [facility];  Service: Vascular;  Laterality: Right;  . Incision and drainage abscess Right 04/11/2015    Procedure: INCISION AND DRAINAGE OF RIGHT THIGH ABSCESS;  Surgeon: Conrad Midway, MD;  Location: [facility];  Service: Vascular;  Laterality: Right;  . Incision and drainage abscess Right 04/13/2015    Procedure: INCISION AND DRAINAGE THIGH ABSCESS;  Surgeon: Conrad [place], MD;  Location: [facility];  Service: Vascular;  Laterality: Right;  . I&d extremity Left 04/17/2015    Procedure: IRRIGATION AND DEBRIDEMENT LEFT EXTREMITY;  Surgeon: Conrad Luke, MD;  Location: [facility];  Service: Vascular;  Laterality: Left;  Marland Kitchen Muscle flap closure Right 04/19/2015    Procedure: RIGHT RECTUS ABDOMINUS  FLAP TO RIGHT GROIN;  Surgeon: Irene Limbo, MD;  Location: [facility];  Service: Plastics;  Laterality: Right;  . Femoral revision Right 04/19/2015    Procedure: OVERSEW RIGHT FEMORAL VEIN ;  Surgeon: Angelia Mould, MD;  Location: [facility];  Service: Vascular;  Laterality: Right;  . Wound exploration Right 04/23/2015    Procedure: EXAM UNDER ANESTHESIA AND VAC CHANGE RIGHT THIGH;  Surgeon: Irene Limbo, MD;  Location: [facility];  Service: Plastics;  Laterality: Right;  . Amputation Right 05/17/2015    Procedure: Right 1st and 2nd Toe Amputation;  Surgeon: Newt Minion, MD;  Location: [facility];  Service: Orthopedics;  Laterality: Right;  . I&d extremity Left 07/09/2015    Procedure: IRRIGATION AND DEBRIDEMENT LEFT WRIST;  Surgeon: Dayna Barker, MD;  Location: [facility];  Service: Plastics;   Laterality: Left;  . I&d extremity Left 08/08/2015    Procedure: IRRIGATION AND DEBRIDEMENT EXTREMITY;  Surgeon: Dayna Barker, MD;  Location: [facility];  Service: Plastics;  Laterality: Left;  . Tonsillectomy  ~ 1972  . Appendectomy  ~ 1980  . Cholecystectomy  ~ 1982  . Inguinal hernia repair Right ~ 1969  . Hemorrhoidectomy with hemorrhoid banding  ~ 1985  . Knee arthroscopy Bilateral 1985-2008    "left-right"  . Joint replacement    . Uvulopalatoplasty  1991  . Cardiac catheterization  1993    "Dr. Martinique; for vasospasms"  . Orif tibia fracture Left 08/17/2015    Procedure: OPEN REDUCTION INTERNAL FIXATION (ORIF) TIBIA FRACTURE;  Surgeon: Lind Guest  Ninfa Linden, MD;  Location: [facility];  Service: Orthopedics;  Laterality: Left;   Social History:  reports that he has quit smoking. His smoking use included Cigarettes. He has a 3 pack-year smoking history. He has never used smokeless tobacco. He reports that he does not drink alcohol or use illicit drugs.  Family / Support Systems Marital Status: Married How Long?: 31 years Patient Roles: Spouse, Parent (son; grandparent) Spouse/Significant Other: Gianno Volner- wife -[phone] Children: 3 adult children; 2 sons live with them (35 and 70 y/o) and 1 dtr lives out of the home; 102 y/o grandson lives with pt/wife Other Supports: church family Anticipated Caregiver: wife Ability/Limitations of Caregiver: Wife does not work.  Wife has COPD, occasionally uses a cane and gets SOB.  One son works.  Another son is in the home and has not been working for the past 3 weeks. Family Dynamics: close, supportive family  Social History Preferred language: English Religion: Baptist Education: college - biology and business Read: Yes Write: Yes Employment Status: Unemployed Date Retired/Disabled/Unemployed: Pt has not been able to work for over a year. Legal History/Current Legal Issues: Per pt's last admission, he was appealing his social security  determination and had hired an Forensic psychologist to assist.  Outcome unknown - CSW will f/u with pt further on this. Guardian/Conservator: N/A - Per Dr. Naaman Plummer, pt is capable of making his own decisions.   Abuse/Neglect Physical Abuse: Denies Verbal Abuse: Denies Sexual Abuse: Denies Exploitation of patient/patient's resources: Denies Self-Neglect: Denies  Emotional Status Pt's affect, behavior and adjustment status: Pt was in good spirits and was pleased that he had made so much progress since the last time he was on Rehab in October 2016.  He is motivated to do the work here and confident he will improve. Recent Psychosocial Issues: Pt had surgery on his wrist 2 weeks ago and has had some other medical events, but overall reports doing well.  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 Psychiatric History: Pt has a hx of anxiety and depression and takes medication prescribed by his psychiatrist.  He reports that this is helping and he is not experiencing any signs/symptoms. Substance Abuse History: none reported  Patient / Family Perceptions, Expectations & Goals Pt/Family understanding of illness & functional limitations: Pt has a good understanding of his condition and did not have any outstanding questions that have not been answered. Premorbid pt/family roles/activities: Pt enjoys raising his 75 y/o grandson and most of what they do is centered around him.  He and his wife also go to visit his parents.  Pt likes sports and watches them on TV.  Used to golf and play basketball. Anticipated changes in roles/activities/participation: Pt wants to get back to being with his family as soon as possible. Pt/family expectations/goals: Pt is ready to "do the hard work I need to do here so that I can get back to where I was and go home."  [employer]: Other (Comment) (DSS) Premorbid Home Care/DME Agencies: Other (Comment) ([facility] for RN - IV  antibiotics and wound monitoring) Transportation available at discharge: family  Discharge Planning Living Arrangements: Spouse/significant other, Children Support Systems: Spouse/significant other, Children, Parent, Other relatives, Friends/neighbors, Immunologist, Home care staff Type of Residence: Private residence Insurance Resources: Medicaid (specify county) Sports coach) Financial Resources: Family Support Financial Screen Referred: No Living Expenses: Higher education careers adviser Management: Patient, Spouse, Family Does the patient have any problems obtaining your medications?: No Home Management: Pt's sons help  with home management and cooking. Patient/Family Preliminary Plans: Pt plans to return to his home with his wife, 2 adult sons, and 56 y/o grandson.  Wife to provide 24/7 supervision. Barriers to Discharge: Steps (within the house; has ramp to enter home) Social Work Anticipated Follow Up Needs: HH/OP Expected length of stay: 12 to 14 days  Clinical Impression CSW met with pt who is known to this CSW from previous admission to CIR in October 2016.  Pt was upbeat and positive about this admission and knows he will do well on CIR.  Pt has felt much better over the past few months and was able to chuckle about the fall and broken leg, just as he was heading toward being 100%.  Pt remains on IV antibiotics at home, but isn't sure how much longer he will need them.  He expects to learn that while he is here.  CSW reminded pt of how Rehab works and that his team conference will be on Tuesdays.  Pt's wife plans to be with him at home to provide supervision and support.  Sons remain helpful to pt and wife.  Pt does not have any current concerns, questions, needs, but CSW will remain available to assist as needed.  Thurmon Mizell, Silvestre Mesi 08/23/2015, 1:43 PM

## 2015-08-24 ENCOUNTER — Inpatient Hospital Stay (HOSPITAL_COMMUNITY): Payer: Medicaid Other | Admitting: Physical Therapy

## 2015-08-24 ENCOUNTER — Inpatient Hospital Stay (HOSPITAL_COMMUNITY): Payer: Medicaid Other

## 2015-08-24 NOTE — Progress Notes (Signed)
[name] is a 60 y.o. male 02/18/1956 [id number]  Subjective: No new complaints. No new problems. Slept well. Feeling OK. Worn out by number of therapy sessions  Objective: Vital signs in last 24 hours: Temp:  [98 F (36.7 C)-98.1 F (36.7 C)] 98.1 F (36.7 C) (01/28 0506) Pulse Rate:  [71-85] 74 (01/28 0752) Resp:  [18] 18 (01/28 0506) BP: (106-115)/(62-69) 112/64 mmHg (01/28 0752) SpO2:  [97 %-98 %] 98 % (01/28 0506) Weight:  [105.688 kg (233 lb)] 105.688 kg (233 lb) (01/28 0500) Weight change:  Last BM Date: 08/21/15  Intake/Output from previous day: 01/27 0701 - 01/28 0700 In: 1200 [P.O.:1200] Out: 2650 [Urine:2650]  Physical Exam General: No apparent distress    Lungs: Normal effort. Lungs clear to auscultation, no crackles or wheezes. Cardiovascular: Regular rate and rhythm, no edema Neurological: No new neurological deficits Wounds: Clean, dry, intact. No signs of infection.  Lab Results: BMET    Component Value Date/Time   NA 137 08/22/2015 0507   K 3.8 08/22/2015 0507   CL 100* 08/22/2015 0507   CO2 26 08/22/2015 0507   GLUCOSE 117* 08/22/2015 0507   BUN 11 08/22/2015 0507   CREATININE 1.00 08/22/2015 0507   CALCIUM 9.0 08/22/2015 0507   GFRNONAA >60 08/22/2015 0507   GFRAA >60 08/22/2015 0507   CBC    Component Value Date/Time   WBC 9.3 08/22/2015 0507   RBC 3.93* 08/22/2015 0507   RBC 3.36* 04/08/2015 0848   HGB 10.7* 08/22/2015 0507   HCT 33.7* 08/22/2015 0507   PLT 321 08/22/2015 0507   MCV 85.8 08/22/2015 0507   MCH 27.2 08/22/2015 0507   MCHC 31.8 08/22/2015 0507   RDW 16.5* 08/22/2015 0507   LYMPHSABS 2.5 08/22/2015 0507   MONOABS 1.0 08/22/2015 0507   EOSABS 0.3 08/22/2015 0507   BASOSABS 0.0 08/22/2015 0507   CBG's (last 3):  No results for input(s): GLUCAP in the last 72 hours. LFT's Lab Results  Component Value Date   ALT 12* 08/22/2015   AST 12* 08/22/2015   ALKPHOS 89 08/22/2015   BILITOT 0.6 08/22/2015     Studies/Results: No results found.  Medications:  I have reviewed the patient's current medications. Scheduled Medications: . apixaban  5 mg Oral BID  . atorvastatin  80 mg Oral Daily  . diclofenac sodium  2 g Topical QID  . docusate sodium  100 mg Oral BID  . flecainide  100 mg Oral Q12H  . gabapentin  100 mg Oral TID  . metoprolol tartrate  75 mg Oral BID  . morphine  90 mg Oral Q12H  . multivitamin with minerals  1 tablet Oral Daily  . polyethylene glycol  17 g Oral Daily  . vancomycin (VANCOCIN) 1250 mg IVPB  1,500 mg Intravenous Q12H  . venlafaxine XR  75 mg Oral Q breakfast   PRN Medications: acetaminophen, albuterol, methocarbamol, ondansetron **OR** ondansetron (ZOFRAN) IV, oxycodone, sodium chloride flush, sorbitol  Assessment/Plan: Active Problems:   Fracture of shaft of left tibia   Post-operative pain   Essential hypertension   Paroxysmal SVT (supraventricular tachycardia) (HCC)   Osteomyelitis of left wrist (HCC)   History of amputation of lesser toe of right foot (HCC)   Abnormality of gait   HLD (hyperlipidemia)   Chronic diastolic congestive heart failure (HCC)   Depression   Fracture of left tibia  1. Left distal third comminuted tibia shaft fracture secondary to fall. Status post ORIF 08/17/2015. Nonweightbearing 6 weeks -continue CIR therapies -  left wrist remains inhibiting but he's working through the pain 2. DVT Prophylaxis/Anticoagulation: Eliquis for history of SVT. 3. Pain Management/chronic pain management: MS Contin 90 mg every 12 hours, Neurontin 100 mg 3 times a day, Cymbalta 60 mg daily, Robaxin and oxycodone  q4 prn----may have up to 5 x per day -follow for pain tolerance with therapies 4. Mood: Effexor 75 mg daily. 5. Neuropsych: This patient is capable of making decisions on his own behalf. 6. Skin/Wound Care: Routine skin checks. Wounds clean/continue current dressing 7.  Fluids/Electrolytes/Nutrition: I personally reviewed the patient's labs today. All essentially are normal. 8. Chronic left wrist osteomyelitis/MRSA. Status post recent I&D per Dr.Coley and maintain on intravenous vancomycin prior to admission. Will confirm duration of antibiotics. Weightbearing as tolerated to left elbow only -wbc/hgb are stable to improved. 9. History of SVT. Cardiac rate controlled. Continue Tambocor 100 mg every 12 hours, Lopressor 75 mg twice a day 10. Chronic diastolic congestive heart failure. Daily weights. I's and O's negative so far 11. Hyperlipidemia. Lipitor 12. History of toe amputation on right: Cont education with b/l LE deficits  Length of stay, days: 3   Conall Vangorder A. Felicity Coyer, MD 08/24/2015, 12:27 PM

## 2015-08-24 NOTE — Progress Notes (Signed)
Pharmacy Antibiotic Follow-up Note [name] is a 60 y.o. year-old male admitted on 08/21/2015. He is on vancomycin while for progressive osteomyelitis of the left wrist with septic arthritis due from potential bacillus. He is followed by ID as outpatient. Per ID note in 1/13, likely duration of 6 weeks. Renal function has been stable. Est. crcl > 100 ml/min.  Assessment/Plan: 1. Continue current dose of vancomycin of 1500 mg Q12 hours  2. Weekly trough unless clinical course dictates otherwise, next due 1/20 3. SCr q 72H; will order one for 1/29 4. Following daily; notes prn   Recent Labs Lab 08/20/15 0515 08/22/15 0507  WBC 6.9 9.3     Recent Labs Lab 08/18/15 1425 08/22/15 0507  CREATININE 0.98 1.00    Antimicrobials this admission: 1/18 vancomycin  >>  Levels/dose changes this admission: 1/21 VT: 12 on 1250 mg q12h, increase to 1500 mg q12h  1/23 VT: 18, cont 1500 mg q12h  Microbiology results: 1/12 Tissue: ngF 1/8 UCx: ngF  1/8 blood: ngF  07/09/15 L wrist wound: Bacillus species   Thank you for allowing pharmacy to be a part of this patient's care.  Bayard Hugger, PharmD, BCPS  Clinical Pharmacist  Pager: [phone]   08/24/2015, 11:38 AM

## 2015-08-24 NOTE — Progress Notes (Signed)
Occupational Therapy Session Note  Patient Details  Name: [name] MRN: [id number] Date of Birth: 08-Aug-1955  Today's Date: 08/24/2015 OT Individual Time: 1400-1500 OT Individual Time Calculation (min): 60 min    Short Term Goals: Week 1:  OT Short Term Goal 1 (Week 1): Pt will be min assist with LB ADLs using AE prn OT Short Term Goal 2 (Week 1): Pt will be educated on a UB HEP for BUEs and trunk OT Short Term Goal 3 (Week 1): Pt will be supervision with toilet transfers, no cueing needed for correct position of w/c or body during transfers OT Short Term Goal 4 (Week 1): Pt will be min assist for simple meal prep performing sit to/from stands prn/appropriately   Skilled Therapeutic Interventions/Progress Updates: Therapeutic activity with focus on BUE HEP, trunk rotation, dynamic sitting balance.   Pt completed 4 sessions using Dynavision, seated, to promote weight-shifting, trunk rotation, attention/reaction response to stimuli, safety awareness, and BUE conditioning.   Pt re-educated on LLE orthotics management with OT providing adjustment/repositioning within orthotics to support LLE d/t loose fit from misaligned velcro closures.   Pt completed tasks with supervision assist to maintain NWB precautions while seated in w/c.  Recommend grading Dynavision task to include supported standing in platform walker with focus on right upper and left upper quadrants.     Therapy Documentation Precautions:  Precautions Precautions: Fall Precaution Comments: Right toe amputations, increased pain in right knee with mobility and in standing  Restrictions Weight Bearing Restrictions: Yes LUE Weight Bearing: Weight bear through elbow only LLE Weight Bearing: Non weight bearing   Vital Signs: Therapy Vitals Temp: 98.7 F (37.1 C) Temp Source: Oral Pulse Rate: 75 Resp: 16 BP: 104/61 mmHg Patient Position (if appropriate): Sitting Oxygen Therapy SpO2: 100 % O2 Device: Not Delivered    Pain: Pain Assessment Pain Assessment: 0-10 Pain Score: 8     See Function Navigator for Current Functional Status.   Therapy/Group: Individual Therapy  Javid Kemler 08/24/2015, 3:05 PM

## 2015-08-24 NOTE — Progress Notes (Addendum)
Physical Therapy Note  Patient Details  Name: [name] MRN: [id number] Date of Birth: [birth year] Today's Date: 08/24/2015    Time: 800-915 75 minutes  1:1 Pt with c/o "sore" L LE, meds given prior to session.  Pt performed squat/scoot pivot transfers bed<>w/c<>mat during session with min A, cues for UE placement.  W/c mobility in room and in controlled environment with supervision.  Sit to stand from mat to PFRW with min A, multiple repetitions for strengthening and concentrating on eccentric control to sit.  Standing tolerance 3 mins, 2 mins.  Hopping in place with PFRW 3 x 10.  Gait with PFRW with min A 10' x 3. Pt states he feels 10' is enough distance to get in/out of bathroom.  Supine therex for LE strengthening 2 x 15 bilat SAQ, hip abd/add, add squz, heel slides. Pt requires min A to doff boot at end of session.  Time 2: 1300-1345 45 minutes  1:1 Pt with no c/o pain this session.  Pt propelled w/c 175' x 2 with supervision, 1 rest break each attempt.  Sit to stand from w/c initially max A due to wrong L UE placement and pt with shoulder pain. Pt able to adjust UE position and sit to stand from w/c with min A.  Continued practice of sit to stand from w/c to increase LE/UE strength to raise from low surface.  Gait with PFRW 2 x 30' with min A, good control of NWB on L LE.   Seated ab exercises with trunk PNFs and partial crunches to increase core strength and stability, pt requires  More frequent rests at end of session but with good motivation to continue.   Axell Trigueros 08/24/2015, 9:15 AM

## 2015-08-25 LAB — BASIC METABOLIC PANEL
ANION GAP: 10 (ref 5–15)
BUN: 12 mg/dL (ref 6–20)
CALCIUM: 9.2 mg/dL (ref 8.9–10.3)
CO2: 28 mmol/L (ref 22–32)
Chloride: 103 mmol/L (ref 101–111)
Creatinine, Ser: 0.9 mg/dL (ref 0.61–1.24)
GFR calc Af Amer: >60 mL/min (ref >60)
GLUCOSE: 152 mg/dL — AB (ref 65–99)
Potassium: 4.4 mmol/L (ref 3.5–5.1)
SODIUM: 141 mmol/L (ref 135–145)

## 2015-08-25 MED ORDER — ALTEPLASE 2 MG IJ SOLR
2.0000 mg | Freq: Once | INTRAMUSCULAR | Status: AC
  Administered 2015-08-25: 2 mg
  Filled 2015-08-25: qty 2

## 2015-08-25 NOTE — Progress Notes (Signed)
AADVIK ROKER is a 60 y.o. male 15-Sep-1955 [id number]  Subjective: No new complaints. No new problems. Slept well. Feeling OK. Less "worn out" today after resting  Objective: Vital signs in last 24 hours: Temp:  [98.4 F (36.9 C)-98.7 F (37.1 C)] 98.4 F (36.9 C) (01/29 0525) Pulse Rate:  [68-76] 68 (01/29 0525) Resp:  [16-17] 17 (01/29 0525) BP: (104-108)/(61-81) 108/81 mmHg (01/29 0525) SpO2:  [98 %-100 %] 98 % (01/29 0525) Weight:  [104.8 kg (231 lb 0.7 oz)] 104.8 kg (231 lb 0.7 oz) (01/29 0500) Weight change: -0.888 kg (-1 lb 15.3 oz) Last BM Date: 08/23/15  Intake/Output from previous day: 01/28 0701 - 01/29 0700 In: 1320 [P.O.:1320] Out: 2025 [Urine:2025]  Physical Exam General: No apparent distress    Lungs: Normal effort. Lungs clear to auscultation, no crackles or wheezes. Cardiovascular: Regular rate and rhythm, no edema Neurological: No new neurological deficits Wounds: Clean, dry, intact. No signs of infection.  Lab Results: BMET    Component Value Date/Time   NA 137 08/22/2015 0507   K 3.8 08/22/2015 0507   CL 100* 08/22/2015 0507   CO2 26 08/22/2015 0507   GLUCOSE 117* 08/22/2015 0507   BUN 11 08/22/2015 0507   CREATININE 1.00 08/22/2015 0507   CALCIUM 9.0 08/22/2015 0507   GFRNONAA >60 08/22/2015 0507   GFRAA >60 08/22/2015 0507   CBC    Component Value Date/Time   WBC 9.3 08/22/2015 0507   RBC 3.93* 08/22/2015 0507   RBC 3.36* 04/08/2015 0848   HGB 10.7* 08/22/2015 0507   HCT 33.7* 08/22/2015 0507   PLT 321 08/22/2015 0507   MCV 85.8 08/22/2015 0507   MCH 27.2 08/22/2015 0507   MCHC 31.8 08/22/2015 0507   RDW 16.5* 08/22/2015 0507   LYMPHSABS 2.5 08/22/2015 0507   MONOABS 1.0 08/22/2015 0507   EOSABS 0.3 08/22/2015 0507   BASOSABS 0.0 08/22/2015 0507   CBG's (last 3):  No results for input(s): GLUCAP in the last 72 hours. LFT's Lab Results  Component Value Date   ALT 12* 08/22/2015   AST 12* 08/22/2015   ALKPHOS 89 08/22/2015    BILITOT 0.6 08/22/2015    Studies/Results: No results found.  Medications:  I have reviewed the patient's current medications. Scheduled Medications: . apixaban  5 mg Oral BID  . atorvastatin  80 mg Oral Daily  . diclofenac sodium  2 g Topical QID  . docusate sodium  100 mg Oral BID  . flecainide  100 mg Oral Q12H  . gabapentin  100 mg Oral TID  . metoprolol tartrate  75 mg Oral BID  . morphine  90 mg Oral Q12H  . multivitamin with minerals  1 tablet Oral Daily  . polyethylene glycol  17 g Oral Daily  . vancomycin (VANCOCIN) 1250 mg IVPB  1,500 mg Intravenous Q12H  . venlafaxine XR  75 mg Oral Q breakfast   PRN Medications: acetaminophen, albuterol, methocarbamol, ondansetron **OR** ondansetron (ZOFRAN) IV, oxycodone, sodium chloride flush, sorbitol  Assessment/Plan: Active Problems:   Fracture of shaft of left tibia   Post-operative pain   Essential hypertension   Paroxysmal SVT (supraventricular tachycardia) (HCC)   Osteomyelitis of left wrist (HCC)   History of amputation of lesser toe of right foot (HCC)   Abnormality of gait   HLD (hyperlipidemia)   Chronic diastolic congestive heart failure (HCC)   Depression   Fracture of left tibia  1. Left distal third comminuted tibia shaft fracture secondary to fall. Status post  ORIF 08/17/2015. Nonweightbearing 6 weeks -continue CIR therapies -left wrist remains inhibiting but he's working through the pain 2. DVT Prophylaxis/Anticoagulation: Eliquis for history of SVT. 3. Pain Management/chronic pain management: MS Contin 90 mg every 12 hours, Neurontin 100 mg 3 times a day, Cymbalta 60 mg daily, Robaxin and oxycodone  q4 prn----may have up to 5 x per day -follow for pain tolerance with therapies 4. Mood: Effexor 75 mg daily. 5. Neuropsych: This patient is capable of making decisions on his own behalf. 6. Skin/Wound Care: Routine skin checks. Wounds clean/continue current  dressing 7. Fluids/Electrolytes/Nutrition: I personally reviewed the patient's labs today. All essentially are normal. 8. Chronic left wrist osteomyelitis/MRSA. Status post recent I&D per Dr.Coley and maintain on intravenous vancomycin prior to admission. Will confirm duration of antibiotics. Weightbearing as tolerated to left elbow only -wbc/hgb are stable to improved. 9. History of SVT. Cardiac rate controlled. Continue Tambocor 100 mg every 12 hours, Lopressor 75 mg twice a day 10. Chronic diastolic congestive heart failure. Daily weights. I's and O's negative so far 11. Hyperlipidemia. Lipitor 12. History of toe amputation on right: Cont education with b/l LE deficits  Length of stay, days: 4   Destyni Hoppel A. Felicity Coyer, MD 08/25/2015, 10:15 AM

## 2015-08-26 ENCOUNTER — Inpatient Hospital Stay (HOSPITAL_COMMUNITY): Payer: Medicaid Other

## 2015-08-26 ENCOUNTER — Inpatient Hospital Stay (HOSPITAL_COMMUNITY): Payer: Medicaid Other | Admitting: Occupational Therapy

## 2015-08-26 LAB — VANCOMYCIN, TROUGH: Vancomycin Tr: 23 ug/mL — ABNORMAL HIGH (ref 10.0–20.0)

## 2015-08-26 MED ORDER — SODIUM CHLORIDE 0.9 % IV SOLN
1250.0000 mg | Freq: Two times a day (BID) | INTRAVENOUS | Status: DC
  Administered 2015-08-27 – 2015-08-31 (×9): 1250 mg via INTRAVENOUS
  Filled 2015-08-26 (×10): qty 1250

## 2015-08-26 NOTE — Progress Notes (Signed)
Occupational Therapy Session Note  Patient Details  Name: [name] MRN: [id number] Date of Birth: 09-28-1955  Today's Date: 08/26/2015 OT Individual Time: 0865-7846 OT Individual Time Calculation (min): 60 min    Short Term Goals: Week 1:  OT Short Term Goal 1 (Week 1): Pt will be min assist with LB ADLs using AE prn OT Short Term Goal 2 (Week 1): Pt will be educated on a UB HEP for BUEs and trunk OT Short Term Goal 3 (Week 1): Pt will be supervision with toilet transfers, no cueing needed for correct position of w/c or body during transfers OT Short Term Goal 4 (Week 1): Pt will be min assist for simple meal prep performing sit to/from stands prn/appropriately   Skilled Therapeutic Interventions/Progress Updates:    Pt seen for OT ADL bathing and dressing session. Pt in supine upon arrival, agreeable to tx session. Pt voicing 6/10 pain, RN aware and medications provided. Pt completd squat pivot transfer to w/c with min A and VCs for hand placement. UB bathing/ dressing completed at sink level with set-up, he declined LB bathing/ dressing until wife could bring clean clothes. He completed shaving task in sitting with VCs for functional incoorporation on L UE. He stood with steadying assist to complete oral care in order to build functional activity tolerance and standing balance.  He self propelled w/c to ADL apartment where he completed simulated tub bench transfer at supervision level. He completed sit >stand at PFRW with supervision and required min A for stand> sit due to decreased decentric control. He voiced increased movement and function in L hand. Discussed with pt ways to incorporate L UE therapy into functional task. He was provided with red built up gripper to ease grasping of utensils with L UE. Upon return to room, pt demonstrated functional use of L UE using built up grip. Pt left sitting in w/c, set-up with meal tray and all needs in reach.    Therapy  Documentation Precautions:  Precautions Precautions: Fall Precaution Comments: Right toe amputations, increased pain in right knee with mobility and in standing  Restrictions Weight Bearing Restrictions: Yes LUE Weight Bearing: Weight bear through elbow only LLE Weight Bearing: Non weight bearing Pain: Pain Assessment Pain Score: 6   RN made aware, medication provided, repositioned   See Function Navigator for Current Functional Status.   Therapy/Group: Individual Therapy  [name], Jakhia Buxton C 08/26/2015, 8:04 AM

## 2015-08-26 NOTE — Progress Notes (Signed)
Pharmacy Antibiotic Follow-up Note [name] is a 60 y.o. year-old male admitted on 08/21/2015. He is on vancomycin while for progressive osteomyelitis of the left wrist with septic arthritis due from potential bacillus. He is followed by ID as outpatient. Per ID note in 1/13, likely duration of 6 weeks.  Vancomycin trough = 23, slightly above goal, drawn 12 hrs after AM dose. Probably accumulating. Renal function has been stable. Est. crcl > 100 ml/min. Per RN, patient was asking to schedule vancomycin at 6AM and 6PM for convenience participating PT/OT activities.      Assessment/Plan: - Decrease vancomycin to 1250 mg Q12 hours, next dose at 0600.   - SCr q 72H - Recheck trough Thursday or Friday   Recent Labs Lab 08/20/15 0515 08/22/15 0507  WBC 6.9 9.3     Recent Labs Lab 08/22/15 0507 08/25/15 1025  CREATININE 1.00 0.90    Antimicrobials this admission: 1/18 vancomycin  >>  Levels/dose changes this admission: 1/21 VT: 12 on 1250 mg q12h, increase to 1500 mg q12h  1/23 VT: 18, cont 1500 mg q12h 1/30 VT: 23, decrease to 1250 Q 12h  Microbiology results: 1/12 Tissue: ngF 1/8 UCx: ngF  1/8 blood: ngF  07/09/15 L wrist wound: Bacillus species   Thank you for allowing pharmacy to be a part of this patient's care.  Bayard Hugger, PharmD, BCPS  Clinical Pharmacist  Pager: [phone]   08/26/2015, 9:32 PM

## 2015-08-26 NOTE — Discharge Summary (Signed)
Patient ID: [name] MRN: [id number] DOB/AGE: [birth year] 60 y.o.  Admit date: 08/15/2015 Discharge date: 08/26/2015  Admission Diagnoses:  Principal Problem:   Fracture of shaft of left tibia Active Problems:   Fracture, tibia, shaft   Post-operative pain   Essential hypertension   Paroxysmal SVT (supraventricular tachycardia) (HCC)   Osteomyelitis of left wrist (HCC)   History of amputation of lesser toe of right foot [facility])   Discharge Diagnoses:  Same  Past Medical History  Diagnosis Date  . Hypertension   . Chronic knee pain     "both sides"  . Hyperlipemia   . PUD (peptic ulcer disease)   . Peripheral vascular disease (HCC)   . Anxiety   . GERD (gastroesophageal reflux disease)   . 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 Atrial fibrillation (HCC) dx'd 04/2015  . Chronic bronchitis (HCC)     "used to get it q yr; nothing in the last ~ 5 yrs" (08/15/2015)  . Migraine     "q 3 months" (08/15/2015)  . Osteoarthritis of both knees   . Depression     Surgeries: Procedure(s): OPEN REDUCTION INTERNAL FIXATION (ORIF) TIBIA FRACTURE on 08/15/2015 - 08/17/2015   Consultants: Treatment Team:  Kathryne Hitch, MD  Discharged Condition: Improved  Hospital Course: [name] is an 60 y.o. male who was admitted 08/15/2015 for operative treatment ofFracture of shaft of left tibia. Patient has severe unremitting pain that affects sleep, daily activities, and work/hobbies. After pre-op clearance the patient was taken to the operating room on 08/15/2015 - 08/17/2015 and underwent  Procedure(s): OPEN REDUCTION INTERNAL FIXATION (ORIF) TIBIA FRACTURE.    Patient was given perioperative antibiotics: Anti-infectives    Start     Dose/Rate Route Frequency Ordered Stop   08/17/15 0600  vancomycin (VANCOCIN) 1,500 mg in sodium chloride 0.9 % 250 mL IVPB  Status:  Discontinued     1,500 mg 166.7 mL/hr over 90 Minutes Intravenous  Every 12 hours 08/17/15 0544 08/21/15 1529   08/15/15 1730  vancomycin (VANCOCIN) 1,250 mg in sodium chloride 0.9 % 250 mL IVPB  Status:  Discontinued     1,250 mg 166.7 mL/hr over 90 Minutes Intravenous Every 12 hours 08/15/15 1423 08/17/15 0544       Patient was given sequential compression devices, early ambulation, and chemoprophylaxis to prevent DVT.  Patient benefited maximally from hospital stay and there were no complications.    Recent vital signs: No data found.    Recent laboratory studies:  Recent Labs  08/25/15 1025  NA 141  K 4.4  CL 103  CO2 28  BUN 12  CREATININE 0.90  GLUCOSE 152*  CALCIUM 9.2     Discharge Medications:     Medication List    ASK your doctor about these medications        apixaban 5 MG Tabs tablet  Commonly known as:  ELIQUIS  Take 1 tablet (5 mg total) by mouth 2 (two) times daily.     atorvastatin 80 MG tablet  Commonly known as:  LIPITOR  Take 1 tablet (80 mg total) by mouth daily.     Diclofenac-Misoprostol 75-0.2 MG Tbec     docusate sodium 100 MG capsule  Commonly known as:  COLACE  Take 1 capsule (100 mg total) by mouth 2 (two) times daily.     DULoxetine 30 MG capsule  Commonly known as:  CYMBALTA  Take 60 mg by mouth daily  at 12 noon.     famciclovir 500 MG tablet  Commonly known as:  FAMVIR  Take 500 mg by mouth 2 (two) times daily as needed (fever blisters).     flecainide 100 MG tablet  Commonly known as:  TAMBOCOR  Take 1 tablet (100 mg total) by mouth every 12 (twelve) hours.     gabapentin 100 MG capsule  Commonly known as:  NEURONTIN  Take 1 capsule (100 mg total) by mouth 3 (three) times daily.     Metoprolol Tartrate 75 MG Tabs  Take 75 mg by mouth 2 (two) times daily.     morphine 30 MG 12 hr tablet  Commonly known as:  MS CONTIN  Take 3 tablets (90 mg total) by mouth every 12 (twelve) hours.     multivitamin with minerals Tabs tablet  Take 1 tablet by mouth daily.     naloxegol oxalate 25  MG Tabs tablet  Commonly known as:  MOVANTIK  Take 1 tablet (25 mg total) by mouth daily before breakfast.     oxycodone 30 MG immediate release tablet  Commonly known as:  ROXICODONE  Take 1 tablet (30 mg total) by mouth every 6 (six) hours. scheduled     polyethylene glycol packet  Commonly known as:  MIRALAX / GLYCOLAX  Take 17 g by mouth daily.     PROAIR HFA 108 (90 Base) MCG/ACT inhaler  Generic drug:  albuterol  Inhale 1 puff into the lungs every 6 (six) hours as needed for wheezing or shortness of breath.     SUMAtriptan 50 MG tablet  Commonly known as:  IMITREX  Take 50 mg by mouth daily as needed for migraine or headache. Maximum 2 doses in 2 days     temazepam 15 MG capsule  Commonly known as:  RESTORIL  Take 30 mg by mouth at bedtime.     testosterone cypionate 200 MG/ML injection  Commonly known as:  DEPOTESTOSTERONE CYPIONATE  Inject 100 mg into the muscle every 14 (fourteen) days. Reported on 08/05/2015     trifluridine 1 % ophthalmic solution  Commonly known as:  VIROPTIC  Place 1 drop into both eyes 2 (two) times daily as needed (breakouts (blurry, matted eyelids)).     vancomycin 1,250 mg in sodium chloride 0.9 % 250 mL  Inject 1,250 mg into the vein every 12 (twelve) hours.     venlafaxine XR 75 MG 24 hr capsule  Commonly known as:  EFFEXOR-XR  Take 1 capsule (75 mg total) by mouth daily with breakfast.        Diagnostic Studies: Dg Chest 2 View  08/04/2015  CLINICAL DATA:  Chest x-ray clearance prior to surgery. EXAM: CHEST  2 VIEW COMPARISON:  07/05/2015 FINDINGS: There is a right arm PICC line with tip in the right atrium. The heart size is normal. No pleural effusion or edema. There is no airspace consolidation identified. IMPRESSION: 1. No acute cardiopulmonary abnormalities. Electronically Signed   By: Signa Kell M.D.   On: 08/04/2015 12:07   Dg Wrist Complete Left  08/04/2015  CLINICAL DATA:  Left wrist pain with swelling, limited movement.  History distal left radius fracture in 2016. History of irrigation and debridement surgery of the left wrist on 07/08/2005. EXAM: LEFT WRIST - COMPLETE 3+ VIEW COMPARISON:  Comparison plain films of the left wrist dated 05/10/2015 and 04/07/2015. Comparison also made to wrist MRI dated 07/05/2015. FINDINGS: Again noted is the erosive change along the ulna margin of the  distal left radius, compatible with sequela of patient's previous osteomyelitis. This has not significantly progressed from the previous exam. No new erosions seen. 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 There appears to be some associated osseous fusion of the capitate and lunate bones. Again noted is severe joint space loss at the radiocarpal joint. There has been interval progression of healing at the distal left ulna fracture site, with exuberant callus formation. No acute fracture seen. Soft tissues about the left wrist are unremarkable. IMPRESSION: 1. Extensive erosive changes along the ulnar margin of the distal left radius, compatible with sequela of previous infection, not significantly progressed from the previous exam of 05/10/2015. Also no significant change of the severe radiocarpal joint space narrowing. 2. 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 Probable associated osseous fusion of the capitate and underlying lunate bone. 3. No new erosive change or fracture seen. Electronically Signed   By: Bary Richard M.D.   On: 08/04/2015 11:55   Dg Tibia/fibula Left  08/15/2015  CLINICAL DATA:  Pain following fall EXAM: LEFT TIBIA AND FIBULA - 2 VIEW COMPARISON:  None. FINDINGS: Frontal and lateral views were obtained. There is a comminuted fracture of the distal tibia with medial and posterior displacement of major fracture fragments with respect to the major  proximal fragment. No other fractures. No dislocation apparent. There is a total knee replacement with the prosthetic components in this region appearing well-seated. IMPRESSION: Comminuted fracture distal tibia with displaced fracture fragments. No dislocations. Status post total knee replacement. Electronically Signed   By: Bretta Bang III M.D.   On: 08/15/2015 09:58   Dg Ankle Complete Left  08/15/2015  CLINICAL DATA:  Left leg pain, fell down steps this morning, deformity in left lower leg EXAM: LEFT ANKLE COMPLETE - 3+ VIEW COMPARISON:  04/01/2015 left knee FINDINGS: Three views of the left ankle submitted. No ankle dislocation. There is comminuted displaced oblique fracture in distal shaft of left of tibia. IMPRESSION: Comminuted displaced fracture distal shaft of left tibia. Electronically Signed   By: Natasha Mead M.D.   On: 08/15/2015 10:01   Mr Wrist Left W Wo Contrast  08/05/2015  CLINICAL DATA:  History bacteremia.  Left wrist pain and swelling. EXAM: MR OF THE LEFT WRIST WITHOUT AND WITH CONTRAST TECHNIQUE: Multiplanar multisequence MR imaging of the left wrist was performed both before and after the administration of intravenous contrast. CONTRAST:  20mL MULTIHANCE GADOBENATE DIMEGLUMINE 529 MG/ML IV SOLN COMPARISON:  05/11/2015, 07/05/2015 FINDINGS: Severe patient motion degrading image quality limiting evaluation. Ligaments: Tear of the scapholunate ligament. Lunotriquetral ligament is not delineated. Triangular fibrocartilage: Macerated TFCC. Tendons: Severe tendinosis of the extensor carpi ulnaris with peripheral dislocation from the distal ulnar groove. Otherwise Intact flexor and extensor compartment tendons. Carpal tunnel/median nerve: Normal carpal tunnel. Normal median nerve. Guyon's canal: Normal. Bones: There is no marrow edema in the distal radius, distal ulna, throughout the carpal bones and at the base of the metacarpals. There is erosion of the distal ulnar aspect of the radius  adjacent to the radioulnar joint. There are erosive changes of the distal ulna with disruption of the distal radioulnar joint and cortical thickening, periosteal reaction and surrounding soft tissue edema involving the distal ulnar diaphysis. There is a 7.9 x 15.3 mm peripherally enhancing fluid collection along the volar aspect of the distal radioulnar joint. There is diffuse  synovitis with enhancement Other: There is no soft tissue mass or hematoma. IMPRESSION: 1. Diffuse marrow edema throughout the carpus, distal radius, distal ulna and at the bases of the metacarpals with diffuse synovitis most consistent with septic arthritis. Cortical erosion of the ulnar aspect of the distal radius and of the distal ulna at the distal radioulnar joint with a enhancing distal radioulnar joint effusion measuring 7.9 x 15.3 mm most concerning for septic arthritis. The overall changes are similar in appearance with MRI dated 07/05/2015. 2. There is cortical thickening, periosteal reaction and surrounding soft tissue edema involving the distal ulnar diaphysis which has progressed compared with the prior examination. Electronically Signed   By: Elige Ko   On: 08/05/2015 08:46   Dg Tibia/fibula Left Port  08/17/2015  CLINICAL DATA:  Known tibial fracture following fixation EXAM: PORTABLE LEFT TIBIA AND FIBULA - 2 VIEW COMPARISON:  08/15/2015 FINDINGS: Knee replacement is again identified. There is now a fixation sideplate and multiple fixation screws traversing the distal tibial fracture. The fracture fragments are in near anatomic alignment. Proximal fibular neck fracture is noted as well. IMPRESSION: Status post ORIF of distal tibial fracture. Electronically Signed   By: Alcide Clever M.D.   On: 08/17/2015 10:30    Disposition: [facility] Facility       Signed: Richardean Canal 08/26/2015, 4:24 PM

## 2015-08-26 NOTE — Progress Notes (Signed)
Occupational Therapy Session Note  Patient Details  Name: [name] MRN: [id number] Date of Birth: [birth year]  Today's Date: 08/26/2015 OT Individual Time: 1100-1200 OT Individual Time Calculation (min): 60 min    Short Term Goals: Week 1:  OT Short Term Goal 1 (Week 1): Pt will be min assist with LB ADLs using AE prn OT Short Term Goal 2 (Week 1): Pt will be educated on a UB HEP for BUEs and trunk OT Short Term Goal 3 (Week 1): Pt will be supervision with toilet transfers, no cueing needed for correct position of w/c or body during transfers OT Short Term Goal 4 (Week 1): Pt will be min assist for simple meal prep performing sit to/from stands prn/appropriately   Skilled Therapeutic Interventions/Progress Updates: ADL-retraining (45 min) with focus on foot care (washing, lotion, donning shoe and orthotic).   Pt received seated in w/c with RN attending to IV.   Pt educated on skin inspection, foot care while seated.   Pt completed washing and applying lotion to both feet with setup to remove orthotic and provide supplies.   Pt required min assist to don left sock d/t dressing present and to apply lotion to left heel.    AE not required during session d/t pt able to flex hips sufficiently to reach to his feet.   Therapeutic activity (15 min) with focus on dynamic standing balance and adherence to NWB at LLE.   Pt able to complete 1 game of table tennis using Nintendo Wii and platform walker, standing for 6 minutes with contact guard before c/o fatigue with need to sit in w/c to finish game.        Therapy Documentation Precautions:  Precautions Precautions: Fall Precaution Comments: Right toe amputations, increased pain in right knee with mobility and in standing  Restrictions Weight Bearing Restrictions: Yes LUE Weight Bearing: Weight bear through elbow only LLE Weight Bearing: Non weight bearing   Pain: Pain Assessment Pain Score: 7   See Function Navigator for Current Functional  Status.   Therapy/Group: Individual Therapy  Nyjae Hodge 08/26/2015, 12:56 PM

## 2015-08-26 NOTE — Progress Notes (Signed)
[facility] PHYSICAL MEDICINE & REHABILITATION     PROGRESS NOTE    Subjective/Complaints: Had a good weekend. Feels that he still has a lot of work to accomplish. Pain seems to be under reasonable control.  ROS: Pt denies fever, rash/itching, headache, blurred or double vision, nausea, vomiting, abdominal pain, diarrhea, chest pain, shortness of breath, palpitations, dysuria, dizziness, neck or back pain, bleeding, anxiety, or depression   Objective: Vital Signs: Blood pressure 114/63, pulse 63, temperature 97.5 F (36.4 C), temperature source Oral, resp. rate 18, height  (1.905 m), weight 106.283 kg (234 lb 5 oz), SpO2 100 %. No results found. No results for input(s): WBC, HGB, HCT, PLT in the last 72 hours.  Recent Labs  08/25/15 1025  NA 141  K 4.4  CL 103  GLUCOSE 152*  BUN 12  CREATININE 0.90  CALCIUM 9.2   CBG (last 3)  No results for input(s): GLUCAP in the last 72 hours.  Wt Readings from Last 3 Encounters:  08/26/15 106.283 kg (234 lb 5 oz)  08/18/15 100.6 kg (221 lb 12.5 oz)  08/04/15 99.791 kg (220 lb)    Physical Exam:  Constitutional: He is oriented to person, place, and time. He appears well-developed and well-nourished.  HENT:  Head: Normocephalic and atraumatic.  Eyes: Conjunctivae and EOM are normal.  Neck: Normal range of motion. Neck supple. No thyromegaly present.  Cardiovascular: Normal rate and regular rhythm.  Respiratory: Effort normal and breath sounds normal. No respiratory distress.  GI: Soft. Bowel sounds are normal. He exhibits no distension.  Musculoskeletal: He exhibits edema and tenderness.  Neurological: He is alert and oriented to person, place, and time.  Motor: RUE 5/5 proximal to distal LUE 4/5 shoulder abduction, 4-/5 elbow flexion/extension, 4- to 4/5 hand grip RLE: hip flexion, knee extension 4+/5, ankle dorsi/plantar flexion 4+/5 LLE: hip flexion 4/5, ankle dorsi/plantar flexion 4+/5  Sensation diminished  symetrically distal to knees  Skin: Skin is warm and dry. Old right first toe amp. All incisions clean and dry.  Left upper extremity with dressing in place. Wrist remains tender. Wearing splint  Left lower extremity surgical dressing in place . Incision clean and intact without drainage. Psychiatric: His speech is normal. Judgment normal. Cognition and memory are normal   Assessment/Plan: 1. Mobility/functional deficits secondary to left tibia fracture/osteomyelitis which require 3+ hours per day of interdisciplinary therapy in a comprehensive inpatient rehab setting. Physiatrist is providing close team supervision and 24 hour management of active medical problems listed below. Physiatrist and rehab team continue to assess barriers to discharge/monitor patient progress toward functional and medical goals.  Function:  Bathing Bathing position   Position: Wheelchair/chair at sink  Bathing parts Body parts bathed by patient: Right arm, Left arm, Chest, Abdomen Body parts bathed by helper: Back  Bathing assist Assist Level: Set up   Set up : To obtain items  Upper Body Dressing/Undressing Upper body dressing   What is the patient wearing?: Pull over shirt/dress     Pull over shirt/dress - Perfomed by patient: Thread/unthread right sleeve, Put head through opening, Thread/unthread left sleeve, Pull shirt over trunk Pull over shirt/dress - Perfomed by helper: Thread/unthread left sleeve, Pull shirt over trunk        Upper body assist Assist Level: Set up   Set up : To obtain clothing/put away  Lower Body Dressing/Undressing Lower body dressing   What is the patient wearing?: AFO, Shoes Underwear - Performed by patient: Thread/unthread right underwear leg, Thread/unthread  left underwear leg Underwear - Performed by helper: Pull underwear up/down Pants- Performed by patient: Thread/unthread right pants leg, Thread/unthread left pants leg, Pull pants up/down Pants- Performed by  helper: Thread/unthread left pants leg, Pull pants up/down     Socks - Performed by patient: Don/doff right sock (L N/A) Socks - Performed by helper: Don/doff left sock Shoes - Performed by patient: Don/doff right shoe, Fasten right (L n/a)   AFO - Performed by patient: Don/doff left AFO AFO - Performed by helper: Don/doff left AFO      Lower body assist Assist for lower body dressing: Touching or steadying assistance (Pt > 75%)      Toileting Toileting Toileting activity did not occur:  (did not occur during eval) Toileting steps completed by patient: Performs perineal hygiene Toileting steps completed by helper: Adjust clothing prior to toileting, Adjust clothing after toileting Toileting Assistive Devices: Grab bar or rail  Toileting assist Assist level: Touching or steadying assistance (Pt.75%)   Transfers Chair/bed transfer   Chair/bed transfer method: Squat pivot Chair/bed transfer assist level: Moderate assist (Pt 50 - 74%/lift or lower) Chair/bed transfer assistive device: Walker, Armrests, Orthosis     Locomotion Ambulation     Max distance: 3 Assist level: Moderate assist (Pt 50 - 74%)   Wheelchair   Type: Manual Max wheelchair distance: 150 Assist Level: Supervision or verbal cues  Cognition Comprehension Comprehension assist level: Follows complex conversation/direction with no assist  Expression Expression assist level: Expresses complex ideas: With no assist  Social Interaction Social Interaction assist level: Interacts appropriately with others with medication or extra time (anti-anxiety, antidepressant).  Problem Solving Problem solving assist level: Solves complex problems: Recognizes & self-corrects  Memory Memory assist level: Complete Independence: No helper  Medical Problem List and Plan: 1. Left distal third comminuted tibia shaft fracture secondary to fall. Status post ORIF 08/17/2015. Nonweightbearing 6 weeks  -continue CIR therapies  -left  wrist pain seems to be improving 2. DVT Prophylaxis/Anticoagulation: Eliquis for history of SVT. 3. Pain Management/chronic pain management: MS Contin 90 mg every 12 hours, Neurontin 100 mg 3 times a day, Cymbalta 60 mg daily, Robaxin and oxycodone  q4 prn----may have up to 5 x per day  -improved pain control 4. Mood: Effexor 75 mg daily. 5. Neuropsych: This patient is capable of making decisions on his own behalf. 6. Skin/Wound Care: Routine skin checks. Wounds clean/continue current dressing 7. Fluids/Electrolytes/Nutrition: encourage po. 8. Chronic left wrist osteomyelitis/MRSA. Status post recent I&D per Dr.Coley and maintain on intravenous vancomycin prior to admission. Will confirm duration of antibiotics. Weightbearing as tolerated to left elbow only  -wbc/hgb are stable to improved. 9. History of SVT. Cardiac rate controlled. Continue Tambocor 100 mg every 12 hours, Lopressor 75 mg twice a day 10. Chronic diastolic congestive heart failure. Weight 106kg 11. Hyperlipidemia. Lipitor 12. 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

## 2015-08-26 NOTE — Progress Notes (Signed)
Physical Therapy Session Note  Patient Details  Name: [name] MRN: [id number] Date of Birth: Apr 10, 1956  Today's Date: 08/26/2015 PT Individual Time: 0900-1010 PT Individual Time Calculation (min): 70 min   Short Term Goals: Week 1:  PT Short Term Goal 1 (Week 1): Patient will consistently perform pivot transfers with min A PT Short Term Goal 2 (Week 1): Patient will perform sit to stand min A. PT Short Term Goal 3 (Week 1): dynamic standing balance for 3 minutes with min guard A during functional activity PT Short Term Goal 4 (Week 1): Ambulate 10' min A w/ whelchair following PT Short Term Goal 5 (Week 1): mod I w/c mobility in controlled environment  Skilled Therapeutic Interventions/Progress Updates:    Session focused on d/c planning and discussion of overall goals and home set-up/obstacles for home, w/c propulsion on unit to and from therapies at supervision level due to IV pole management by PT for overall mobility and endurance training, gait training with PFRW x 25' x2 trials with close supervision to steady assist with cues for upright posture and foot placement, various sit to stands from close supervision to steady assist with focus on correct foot placement of RLE, dynamic standing balance and tolerance activity to reach for and pitch horseshoes at close supervision level, and abdominal strengthening exercises to aid with overall balance and mobility including supine crunches and reverse crunches with therapy ball x 2 sets of 15 reps each.   Therapy Documentation Precautions:  Precautions Precautions: Fall Precaution Comments: Right toe amputations, increased pain in right knee with mobility and in standing  Restrictions Weight Bearing Restrictions: Yes LUE Weight Bearing: Weight bear through elbow only LLE Weight Bearing: Non weight bearing  Pain: Premedicated for pain in LLE.    See Function Navigator for Current Functional Status.   Therapy/Group: Individual  Therapy  Karolee Stamps Darrol Poke, PT, DPT  08/26/2015, 12:19 PM

## 2015-08-27 ENCOUNTER — Inpatient Hospital Stay (HOSPITAL_COMMUNITY): Payer: Medicaid Other | Admitting: Physical Therapy

## 2015-08-27 ENCOUNTER — Inpatient Hospital Stay (HOSPITAL_COMMUNITY): Payer: Medicaid Other | Admitting: Occupational Therapy

## 2015-08-27 NOTE — Progress Notes (Signed)
Occupational Therapy Session Note  Patient Details  Name: [name] MRN: [id number] Date of Birth: [birth year]  Today's Date: 08/27/2015 OT Individual Time: 4098-1191 OT Individual Time Calculation (min): 75 min    Short Term Goals: Week 1:  OT Short Term Goal 1 (Week 1): Pt will be min assist with LB ADLs using AE prn OT Short Term Goal 2 (Week 1): Pt will be educated on a UB HEP for BUEs and trunk OT Short Term Goal 3 (Week 1): Pt will be supervision with toilet transfers, no cueing needed for correct position of w/[name] or body during transfers OT Short Term Goal 4 (Week 1): Pt will be min assist for simple meal prep performing sit to/from stands prn/appropriately   Skilled Therapeutic Interventions/Progress Updates:    PT seen for OT session focusing on ADL re-training and functional transfers. Pt up at sink bathing upon arrival. Completed UB bathing/ dressing independently from w/[name] level. Supervision- min A provided for sit <> stand at PFRW for pt to pull pants up.  Pt noted to have edema in B LEs. MD made away and order for TEDs placed- donned with total A and pt educated regarding use and wear schedule. Educated regarding elevating LEs and donning lotion distal to proximal to facilitate fluid movement. He self propelled w/[name] towards therapy gym, VCs provided for energy conversation and w/[name] propulsion technique. Pt voiced difficulty grasping w/[name] rim on L side for functional w/[name] propulsion. Theraband placed along w/[name] rims for increased traction, he voiced increased ease and success with modification.  In ADL apartment, pt completed functional transfer to low soft surface couch as this is what he sleeps on at home. He completed squat pivot transfer to couch with supervision. Increased assist required for returning to w/[name] from lower surface.  Pt returned to room at end of session, left sitting in w/[name] set-up with meal tray and all needs in reach.  He requires supervision- mod A for sit <> stand  depending on sitting surface and assist for controled descent.  Discussed with pt throughout session ways to incorporate therapy and activity into every day tasks, home IADL responsibilities, and d/[name] planning.   Therapy Documentation Precautions:  Precautions Precautions: Fall Precaution Comments: Right toe amputations, increased pain in right knee with mobility and in standing  Restrictions Weight Bearing Restrictions: Yes LUE Weight Bearing: Weight bear through elbow only LLE Weight Bearing: Non weight bearing Pain: Pain Assessment Pain Score: 7   RN aware, medication provided, repositioned  See Function Navigator for Current Functional Status.   Therapy/Group: Individual Therapy  [name], [name] 08/27/2015, 7:05 AM

## 2015-08-27 NOTE — Progress Notes (Signed)
Physical Therapy Session Note  Patient Details  Name: [name] MRN: [id number] Date of Birth: 27-Mar-1956  Today's Date: 08/27/2015 PT Individual Time: 0930-1000 PT Individual Time Calculation (min): 30 min   Short Term Goals: Week 1:  PT Short Term Goal 1 (Week 1): Patient will consistently perform pivot transfers with min A PT Short Term Goal 2 (Week 1): Patient will perform sit to stand min A. PT Short Term Goal 3 (Week 1): dynamic standing balance for 3 minutes with min guard A during functional activity PT Short Term Goal 4 (Week 1): Ambulate 10' min A w/ whelchair following PT Short Term Goal 5 (Week 1): mod I w/c mobility in controlled environment  Skilled Therapeutic Interventions/Progress Updates:    Pt received seated in w/c, c/o wrist pain as described below and agreeable to treatment. W/c propulsion 2 x 175' with BUE, RLE on first trial and only R hemi-technique on second trial due to increased LUE wrist pain. Activity performed for UE/LE strengthening and aerobic endurance. Transfer w/c <>couch in ADL apartment x2 trials with cues for problem solving and technique for safety, hand placement to ensure clearing hips onto w/c when transferring back to w/c. Performs squat pivot technique with RUE on w/c cushion with R hand on seat initially, then placed on arm rest to complete scoot into chair. Pt feels confident that if he can perform on this low couch he will be able to at home where his couch is 2" higher, but overall felt better about squat pivot technique used this time. Returned to room with w/c propulsion as described above, remained seated in w/c at completion of session, all needs within reach.   Therapy Documentation Precautions:  Precautions Precautions: Fall Precaution Comments: Right toe amputations, increased pain in right knee with mobility and in standing  Restrictions Weight Bearing Restrictions: Yes LUE Weight Bearing: Weight bear through elbow only LLE Weight  Bearing: Non weight bearing Pain: Pain Assessment Pain Assessment: 0-10 Pain Score: 6  Pain Location: Wrist Pain Orientation: Left Pain Descriptors / Indicators: Aching;Sore Pain Onset: On-going Patients Stated Pain Goal: 2 Pain Intervention(s): Other (Comment) (pre-medicated) Multiple Pain Sites: No   See Function Navigator for Current Functional Status.   Therapy/Group: Individual Therapy  Vista Lawman 08/27/2015, 12:04 PM

## 2015-08-27 NOTE — Progress Notes (Signed)
Per Dr.Coley [phone]. Plan is to continue intravenous vancomycin at time of discharge and follow-up with orthopedic services within 1 week to determine length of antibiotic care

## 2015-08-27 NOTE — Progress Notes (Signed)
[facility] PHYSICAL MEDICINE & REHABILITATION     PROGRESS NOTE    Subjective/Complaints: No new complaints. Pain improving. Doing better with compensatory strategies.   ROS: Pt denies fever, rash/itching, headache, blurred or double vision, nausea, vomiting, abdominal pain, diarrhea, chest pain, shortness of breath, palpitations, dysuria, dizziness, neck or back pain, bleeding, anxiety, or depression   Objective: Vital Signs: Blood pressure 110/72, pulse 68, temperature 98.2 F (36.8 C), temperature source Oral, resp. rate 18, height  (1.905 m), weight 104.781 kg (231 lb), SpO2 99 %. No results found. No results for input(s): WBC, HGB, HCT, PLT in the last 72 hours.  Recent Labs  08/25/15 1025  NA 141  K 4.4  CL 103  GLUCOSE 152*  BUN 12  CREATININE 0.90  CALCIUM 9.2   CBG (last 3)  No results for input(s): GLUCAP in the last 72 hours.  Wt Readings from Last 3 Encounters:  08/27/15 104.781 kg (231 lb)  08/18/15 100.6 kg (221 lb 12.5 oz)  08/04/15 99.791 kg (220 lb)    Physical Exam:  Constitutional: He is oriented to person, place, and time. He appears well-developed and well-nourished.  HENT:  Head: Normocephalic and atraumatic.  Eyes: Conjunctivae and EOM are normal.  Neck: Normal range of motion. Neck supple. No thyromegaly present.  Cardiovascular: Normal rate and regular rhythm.  Respiratory: Effort normal and breath sounds normal. No respiratory distress.  GI: Soft. Bowel sounds are normal. He exhibits no distension.  Musculoskeletal: He exhibits edema and tenderness.  Neurological: He is alert and oriented to person, place, and time.  Motor: RUE 5/5 proximal to distal LUE 4/5 shoulder abduction, 4-/5 elbow flexion/extension, 4- to 4/5 hand grip RLE: hip flexion, knee extension 4+/5, ankle dorsi/plantar flexion 4+/5 LLE: hip flexion 4/5, ankle dorsi/plantar flexion 4+/5  Sensation diminished symetrically distal to knees  Skin: Skin is warm  and dry. Old right first toe amp. All incisions clean and dry. No drainage/erythema  Left upper extremity with dressing in place. Wrist remains tender. Wearing splint  Left lower extremity surgical dressing in place . Incision clean and intact without drainage. Psychiatric: His speech is normal. Judgment normal. Cognition and memory are normal   Assessment/Plan: 1. Mobility/functional deficits secondary to left tibia fracture/osteomyelitis which require 3+ hours per day of interdisciplinary therapy in a comprehensive inpatient rehab setting. Physiatrist is providing close team supervision and 24 hour management of active medical problems listed below. Physiatrist and rehab team continue to assess barriers to discharge/monitor patient progress toward functional and medical goals.  Function:  Bathing Bathing position   Position: Wheelchair/chair at sink  Bathing parts Body parts bathed by patient: Right arm, Left arm, Chest, Abdomen, Right upper leg, Right lower leg, Left lower leg, Back Body parts bathed by helper: Back  Bathing assist Assist Level: Supervision or verbal cues   Set up : To obtain items  Upper Body Dressing/Undressing Upper body dressing   What is the patient wearing?: Pull over shirt/dress     Pull over shirt/dress - Perfomed by patient: Thread/unthread right sleeve, Put head through opening, Thread/unthread left sleeve, Pull shirt over trunk Pull over shirt/dress - Perfomed by helper: Thread/unthread left sleeve, Pull shirt over trunk        Upper body assist Assist Level: More than reasonable time   Set up : To obtain clothing/put away  Lower Body Dressing/Undressing Lower body dressing   What is the patient wearing?: AFO, Faythe Dingwall, Socks Underwear - Performed by patient: Thread/unthread right  underwear leg, Thread/unthread left underwear leg Underwear - Performed by helper: Pull underwear up/down Pants- Performed by patient: Thread/unthread right pants leg,  Thread/unthread left pants leg, Pull pants up/down Pants- Performed by helper: Thread/unthread left pants leg, Pull pants up/down     Socks - Performed by patient: Don/doff right sock (L n/a) Socks - Performed by helper: Don/doff left sock Shoes - Performed by patient: Don/doff right shoe, Fasten right (L n/a)   AFO - Performed by patient: Don/doff left AFO AFO - Performed by helper: Don/doff left AFO   TED Hose - Performed by helper: Don/doff right TED hose, Don/doff left TED hose  Lower body assist Assist for lower body dressing: Touching or steadying assistance (Pt > 75%)      Toileting Toileting Toileting activity did not occur:  (did not occur during eval) Toileting steps completed by patient: Performs perineal hygiene Toileting steps completed by helper: Adjust clothing prior to toileting, Adjust clothing after toileting Toileting Assistive Devices: Grab bar or rail  Toileting assist Assist level: Touching or steadying assistance (Pt.75%)   Transfers Chair/bed transfer   Chair/bed transfer method: Squat pivot Chair/bed transfer assist level: Touching or steadying assistance (Pt > 75%) Chair/bed transfer assistive device: Armrests     Locomotion Ambulation     Max distance: 25' Assist level: Touching or steadying assistance (Pt > 75%)   Wheelchair   Type: Manual Max wheelchair distance: 150 Assist Level: Supervision or verbal cues  Cognition Comprehension Comprehension assist level: Follows complex conversation/direction with extra time/assistive device  Expression Expression assist level: Expresses complex ideas: With extra time/assistive device  Social Interaction Social Interaction assist level: Interacts appropriately with others with medication or extra time (anti-anxiety, antidepressant).  Problem Solving Problem solving assist level: Solves complex problems: Recognizes & self-corrects  Memory Memory assist level: Complete Independence: No helper  Medical  Problem List and Plan: 1. Left distal third comminuted tibia shaft fracture secondary to fall. Status post ORIF 08/17/2015. Nonweightbearing 6 weeks  -continue CIR therapies. Team conference  -left wrist pain  improving 2. DVT Prophylaxis/Anticoagulation: Eliquis for history of SVT. 3. Pain Management/chronic pain management: MS Contin 90 mg every 12 hours, Neurontin 100 mg 3 times a day, Cymbalta 60 mg daily, Robaxin and oxycodone  q4 prn----may have up to 5 x per day  -improved pain control 4. Mood: Effexor 75 mg daily. 5. Neuropsych: This patient is capable of making decisions on his own behalf. 6. Skin/Wound Care: Routine skin checks. Wounds clean without signs of infection 7. Fluids/Electrolytes/Nutrition: encourage po. 8. Chronic left wrist osteomyelitis/MRSA. Status post recent I&D per Dr.Coley and maintain on intravenous vancomycin prior to admission. Will confirm duration of antibiotics. Weightbearing as tolerated to left elbow only  -wbc/hgb are stable to improved. 9. History of SVT. Cardiac rate controlled. Continue Tambocor 100 mg every 12 hours, Lopressor 75 mg twice a day 10. Chronic diastolic congestive heart failure. Weight 104.7kg today 11. Hyperlipidemia. Lipitor 12. 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

## 2015-08-27 NOTE — Progress Notes (Signed)
Occupational Therapy Session Note  Patient Details   Name: [name] MRN: [id number] Date of Birth: [birth year]  Today's Date: 08/27/2015 OT Individual Time: 4098-1191 OT Individual Time Calculation (min): 27 min    Short Term Goals: Week 1:  OT Short Term Goal 1 (Week 1): Pt will be min assist with LB ADLs using AE prn OT Short Term Goal 2 (Week 1): Pt will be educated on a UB HEP for BUEs and trunk OT Short Term Goal 3 (Week 1): Pt will be supervision with toilet transfers, no cueing needed for correct position of w/c or body during transfers OT Short Term Goal 4 (Week 1): Pt will be min assist for simple meal prep performing sit to/from stands prn/appropriately   Skilled Therapeutic Interventions/Progress Updates:  Upon entering the room, pt supine in bed with no c/o pain this session. Pt performed supine >sit to EOB independently with use of bed rail. Pt donned L LE boot with increased time. Sit <>stand from slightly elevated surface with steady assistance. Pt ambulating 10' to bathroom with platform RW with steady assistance. Pt with uncontrolled sit onto elevated toilet seat for BM this session. Pt able to perform hygiene and clothing management with steady assistance while standing. Pt ambulated to sink in same manner to wash R hand with steady assistance for balance. Pt returning to bed at end of session. Sit >supine with use of bed rail. Call bell and all needed items within reach upon exiting the room.   Therapy Documentation Precautions:  Precautions Precautions: Fall Precaution Comments: Right toe amputations, increased pain in right knee with mobility and in standing  Restrictions Weight Bearing Restrictions: Yes LUE Weight Bearing: Weight bear through elbow only LLE Weight Bearing: Non weight bearing General:   Vital Signs: Therapy Vitals Temp: 98.1 F (36.7 C) Temp Source: Oral Pulse Rate: 74 BP: (!) 97/51 mmHg Patient Position (if appropriate): Lying Oxygen  Therapy SpO2: 96 % O2 Device: Not Delivered  See Function Navigator for Current Functional Status.   Therapy/Group: Individual Therapy  Lowella Grip 08/27/2015, 2:32 PM

## 2015-08-27 NOTE — Progress Notes (Signed)
Physical Therapy Session Note  Patient Details  Name: [name] MRN: [id number] Date of Birth: [birth year]  Today's Date: 08/27/2015 PT Individual Time: 1300-1400 PT Individual Time Calculation (min): 60 min   Short Term Goals: Week 1:  PT Short Term Goal 1 (Week 1): Patient will consistently perform pivot transfers with min A PT Short Term Goal 2 (Week 1): Patient will perform sit to stand min A. PT Short Term Goal 3 (Week 1): dynamic standing balance for 3 minutes with min guard A during functional activity PT Short Term Goal 4 (Week 1): Ambulate 10' min A w/ whelchair following PT Short Term Goal 5 (Week 1): mod I w/c mobility in controlled environment  Skilled Therapeutic Interventions/Progress Updates:   Patient received sitting in wheelchair for session focused on functional mobility, activity tolerance, and strengthening. Patient propelled wheelchair throughout rehab unit with mod I, performed squat pivot transfer to and from low compliant couch surface with supervision including wheelchair setup, gait using PFRW x 30 ft + 20 ft with close supervision and cues for slower pace for safety, performed sit <> stands using PFRW from low surfaces with supervision-steady assist and from wheelchair level with supervision, seated BLE therex 2 x 10 each LE LAQ, R ankle pumps to fatigue, patient c/o R ankle stiffness, performed static standing with RLE on wedge using PFRW for UE support 2 x up to 2 min each trial. Patient voicing concern about BLE edema, returned to bed at end of session and left with BLE elevated and all needs within reach.     Therapy Documentation Precautions:  Precautions Precautions: Fall Precaution Comments: Right toe amputations, increased pain in right knee with mobility and in standing  Restrictions Weight Bearing Restrictions: Yes LUE Weight Bearing: Weight bear through elbow only LLE Weight Bearing: Non weight bearing Pain: Pain Assessment Pain Assessment:  0-10 Pain Score: 6  Pain Type: Acute pain Pain Location: Knee Pain Orientation: Right Pain Descriptors / Indicators: Aching Pain Onset: With Activity Patients Stated Pain Goal: 2 Pain Intervention(s): Rest;Repositioned Multiple Pain Sites: No   See Function Navigator for Current Functional Status.   Therapy/Group: Individual Therapy  [name] 08/27/2015, 2:54 PM

## 2015-08-28 ENCOUNTER — Encounter (HOSPITAL_COMMUNITY): Payer: Self-pay | Admitting: Orthopaedic Surgery

## 2015-08-28 ENCOUNTER — Encounter: Payer: Self-pay | Admitting: Infectious Disease

## 2015-08-28 ENCOUNTER — Inpatient Hospital Stay (HOSPITAL_COMMUNITY): Payer: Medicaid Other | Admitting: Physical Therapy

## 2015-08-28 ENCOUNTER — Inpatient Hospital Stay (HOSPITAL_COMMUNITY): Payer: Medicaid Other | Admitting: Occupational Therapy

## 2015-08-28 NOTE — Progress Notes (Signed)
Occupational Therapy Session Note  Patient Details  Name: [name] MRN: [id number] Date of Birth: [birth year]  Today's Date: 08/28/2015 OT Individual Time: 5621-3086 OT Individual Time Calculation (min): 56 min    Short Term Goals: Week 1:  OT Short Term Goal 1 (Week 1): Pt will be min assist with LB ADLs using AE prn OT Short Term Goal 2 (Week 1): Pt will be educated on a UB HEP for BUEs and trunk OT Short Term Goal 3 (Week 1): Pt will be supervision with toilet transfers, no cueing needed for correct position of w/c or body during transfers OT Short Term Goal 4 (Week 1): Pt will be min assist for simple meal prep performing sit to/from stands prn/appropriately   Skilled Therapeutic Interventions/Progress Updates:    Pt seen for OT session focusing on functional transfers, sit <> stand, and hand strengthening/ coordination, Pt sitting up in w/c upon arrival havnig already completed bathing and dressing at sink and agreeable to tx session. Discussed with pt bathroom set-up at home and problem solving transfer techniques. Pt has small bathroom at home which doesn't allow for PFRW to easily fit in bathroom.  In therapy gym, practiced pt completing sit <> stand from standard chair with B armrests to simulate BSC and practice standing without PFRW. He completed x2 with mod A and steadying assist once standing without UE support. Advised pt against doing this at home as he does not have dynamic standing balance to pull pants up without UE stabilizing support. He then completed sit <> stand using PFRW. When pt stood, chair flipped as pt only able to push up on R LE and R UE, causing chair to flip. Total A required for pt to regain balance and chair placed to prevent fall. Suggested pt use BSC outside of bathroom in order to have more room and ability to use PFRW, will cont to practice sit <> stands before d/c. He transferred to therapy mat and completed standing task at PFRW placing clothes pin on  clothes pin tree with L UE for coordination and strengthening. Pt able to manipulate yellow and red clothes pins, however, unable to manage higher tension clothes pins. Pt also unable to turn clothes pin in hand due to decreased in hand coordination.  Pt self propelled w/c back to room at end of session, left set-up with breakfast and all needs in reach.  He was provided with handout for coordination exercises for L UE using commonly used items.   Therapy Documentation Precautions:  Precautions Precautions: Fall Precaution Comments: Right toe amputations, increased pain in right knee with mobility and in standing  Restrictions Weight Bearing Restrictions: Yes LUE Weight Bearing: Weight bear through elbow only LLE Weight Bearing: Non weight bearing Pain: Pain Assessment Pain Score: 6  Pain Location: Shoulder Pain Orientation: Left Pain Descriptors / Indicators: Aching Pain Intervention(s): Repositioned;Ambulation/increased activity  See Function Navigator for Current Functional Status.   Therapy/Group: Individual Therapy  [name], Leanna Hamid C 08/28/2015, 7:09 AM

## 2015-08-28 NOTE — Plan of Care (Signed)
Problem: RH PAIN MANAGEMENT Goal: RH STG PAIN MANAGED AT OR BELOW PT'S PAIN GOAL 6 or less. Chronic pain management prior to admission  Outcome: Not Progressing Rates pain 6/10

## 2015-08-28 NOTE — Progress Notes (Signed)
[facility] PHYSICAL MEDICINE & REHABILITATION     PROGRESS NOTE    Subjective/Complaints: Overall feeling well. No new complaints. Pain still controlled.    ROS: Pt denies fever, rash/itching, headache, blurred or double vision, nausea, vomiting, abdominal pain, diarrhea, chest pain, shortness of breath, palpitations, dysuria, dizziness, neck or back pain, bleeding, anxiety, or depression   Objective: Vital Signs: Blood pressure 99/63, pulse 62, temperature 98 F (36.7 C), temperature source Oral, resp. rate 18, height  (1.905 m), weight 104.781 kg (231 lb), SpO2 98 %. No results found. No results for input(s): WBC, HGB, HCT, PLT in the last 72 hours.  Recent Labs  08/25/15 1025  NA 141  K 4.4  CL 103  GLUCOSE 152*  BUN 12  CREATININE 0.90  CALCIUM 9.2   CBG (last 3)  No results for input(s): GLUCAP in the last 72 hours.  Wt Readings from Last 3 Encounters:  08/27/15 104.781 kg (231 lb)  08/18/15 100.6 kg (221 lb 12.5 oz)  08/04/15 99.791 kg (220 lb)    Physical Exam:  Constitutional: He is oriented to person, place, and time. He appears well-developed and well-nourished.  HENT:  Head: Normocephalic and atraumatic.  Eyes: Conjunctivae and EOM are normal.  Neck: Normal range of motion. Neck supple. No thyromegaly present.  Cardiovascular: Normal rate and regular rhythm.  Respiratory: Effort normal and breath sounds normal. No respiratory distress.  GI: Soft. Bowel sounds are normal. He exhibits no distension.  Musculoskeletal: He exhibits edema and tenderness.  Neurological: He is alert and oriented to person, place, and time.  Motor: RUE 5/5 proximal to distal LUE 4/5 shoulder abduction, 4-/5 elbow flexion/extension, 4- to 4/5 hand grip RLE: hip flexion, knee extension 4+/5, ankle dorsi/plantar flexion 4+/5 LLE: hip flexion 4/5, ankle dorsi/plantar flexion 4+/5  Sensation diminished symetrically distal to knees  Skin: Skin is warm and dry. Old  right first toe amp. All incisions clean and dry. No drainage/erythema  Left upper extremity with dressing in place. Wrist remains tender. Wearing splint  Left lower extremity surgical dressing in place . Incision clean and intact without drainage. Psychiatric: His speech is normal. Judgment normal. Cognition and memory are normal   Assessment/Plan: 1. Mobility/functional deficits secondary to left tibia fracture/osteomyelitis which require 3+ hours per day of interdisciplinary therapy in a comprehensive inpatient rehab setting. Physiatrist is providing close team supervision and 24 hour management of active medical problems listed below. Physiatrist and rehab team continue to assess barriers to discharge/monitor patient progress toward functional and medical goals.  Function:  Bathing Bathing position   Position: Wheelchair/chair at sink  Bathing parts Body parts bathed by patient: Right arm, Left arm, Chest, Abdomen, Right upper leg, Right lower leg, Left lower leg, Front perineal area, Buttocks, Left upper leg Body parts bathed by helper: Back  Bathing assist Assist Level: More than reasonable time (Pt report)   Set up : To obtain items  Upper Body Dressing/Undressing Upper body dressing   What is the patient wearing?: Pull over shirt/dress     Pull over shirt/dress - Perfomed by patient: Thread/unthread right sleeve, Put head through opening, Thread/unthread left sleeve, Pull shirt over trunk Pull over shirt/dress - Perfomed by helper: Thread/unthread left sleeve, Pull shirt over trunk        Upper body assist Assist Level: More than reasonable time (Pt report)   Set up : To obtain clothing/put away  Lower Body Dressing/Undressing Lower body dressing   What is the patient wearing?: AFO,  [employer], Socks, Pants, Education officer, environmental - Performed by patient: Thread/unthread right underwear leg, Thread/unthread left underwear leg Underwear - Performed by helper: Pull underwear  up/down Pants- Performed by patient: Thread/unthread right pants leg, Thread/unthread left pants leg, Pull pants up/down Pants- Performed by helper: Thread/unthread left pants leg, Pull pants up/down     Socks - Performed by patient: Don/doff right sock (L N/a) Socks - Performed by helper: Don/doff left sock Shoes - Performed by patient: Don/doff right shoe, Fasten right (Pt report)   AFO - Performed by patient: Don/doff left AFO AFO - Performed by helper: Don/doff left AFO TED Hose - Performed by patient: Don/doff right TED hose, Don/doff left TED hose TED Hose - Performed by helper: Don/doff right TED hose, Don/doff left TED hose  Lower body assist Assist for lower body dressing: More than reasonable time, Set up (Pt report)   Set up : To obtain clothing/put away  Toileting Toileting Toileting activity did not occur:  (did not occur during eval) Toileting steps completed by patient: Adjust clothing prior to toileting, Performs perineal hygiene, Adjust clothing after toileting Toileting steps completed by helper: Adjust clothing prior to toileting, Performs perineal hygiene Toileting Assistive Devices: Grab bar or rail  Toileting assist Assist level: Touching or steadying assistance (Pt.75%)   Transfers Chair/bed transfer   Chair/bed transfer method: Squat pivot Chair/bed transfer assist level: Supervision or verbal cues Chair/bed transfer assistive device: Armrests     Locomotion Ambulation     Max distance: 30 Assist level: Supervision or verbal cues   Wheelchair   Type: Manual Max wheelchair distance: 200 Assist Level: No help, No cues, assistive device, takes more than reasonable amount of time  Cognition Comprehension Comprehension assist level: Follows complex conversation/direction with extra time/assistive device  Expression Expression assist level: Expresses complex ideas: With extra time/assistive device  Social Interaction Social Interaction assist level:  Interacts appropriately with others with medication or extra time (anti-anxiety, antidepressant).  Problem Solving Problem solving assist level: Solves complex problems: Recognizes & self-corrects  Memory Memory assist level: Complete Independence: No helper  Medical Problem List and Plan: 1. Left distal third comminuted tibia shaft fracture secondary to fall. Status post ORIF 08/17/2015. Nonweightbearing 6 weeks  -continue CIR therapies. Making gradual progress  -left wrist pain  improving 2. DVT Prophylaxis/Anticoagulation: Eliquis for history of SVT. 3. Pain Management/chronic pain management: MS Contin 90 mg every 12 hours, Neurontin 100 mg 3 times a day, Cymbalta 60 mg daily, Robaxin and oxycodone  q4 prn----may have up to 5 x per day  -improved pain control 4. Mood: Effexor 75 mg daily. 5. Neuropsych: This patient is capable of making decisions on his own behalf. 6. Skin/Wound Care: Routine skin checks. Wounds clean without signs of infection  -remove staples LLE prior to discharge 7. Fluids/Electrolytes/Nutrition: encourage po. 8. Chronic left wrist osteomyelitis/MRSA. Status post recent I&D per Dr.Coley---will need vanc at discharge with plan to determine length of therapy at outpt appt 1 week post discharge  -wbc/hgb are stable to improved. 9. History of SVT. Cardiac rate controlled. Continue Tambocor 100 mg every 12 hours, Lopressor 75 mg twice a day 10. Chronic diastolic congestive heart failure. Weight 104.7kg today 11. Hyperlipidemia. Lipitor 12. 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

## 2015-08-28 NOTE — Progress Notes (Signed)
Occupational Therapy Session Note  Patient Details  Name: [name] MRN: [id number] Date of Birth: May 27, 1956  Today's Date: 08/28/2015 OT Individual Time: 1000-1100 OT Individual Time Calculation (min): 60 min    Short Term Goals: Week 1:  OT Short Term Goal 1 (Week 1): Pt will be min assist with LB ADLs using AE prn OT Short Term Goal 2 (Week 1): Pt will be educated on a UB HEP for BUEs and trunk OT Short Term Goal 3 (Week 1): Pt will be supervision with toilet transfers, no cueing needed for correct position of w/c or body during transfers OT Short Term Goal 4 (Week 1): Pt will be min assist for simple meal prep performing sit to/from stands prn/appropriately       Skilled Therapeutic Interventions/Progress Updates:    Pt seen this session to address sit to stand skills focusing on safety with sit to stand from [facility]. In previous OT sessions, pt had difficulty with controlled descent onto seat and had difficulty coming into stand without putting too much wt on R arm rest of BSC which causes BSC to tip.  Explained to pt he needed to work on R quad strength to enable him to sit to stand without so much reliance on RUE. Pt agreeable but concerned about inducing R knee pain. Discussed working for short periods with rest breaks in between. Pt in agreement. R knee wrapped with ace wrap to support in. Pt stood from w/c to platform walker and transitioned to lower arm chair to simulate BSC.  Pt worked on 4 sets of sit to stand with the last set demonstrating excellent eccentric controlled descent into chair. On each set, pt completed 10 small knee bends/R mini single leg squats only using R hand as a light support on arm rest.  Pt stated knee was tolerating activity, but he stated he did feel his quad working well. Pt then transferred back to w/c. R leg elevated and Ice pack applied as a preventative measure. Pt in chair with all needs met.   Therapy Documentation Precautions:   Precautions Precautions: Fall Precaution Comments: Right toe amputations, increased pain in right knee with mobility and in standing  Restrictions Weight Bearing Restrictions: Yes LUE Weight Bearing: Weight bear through elbow only LLE Weight Bearing: Non weight bearing Therapy Vitals Pulse Rate: 73 BP: 109/68 mmHg Pain: Pain Assessment Pain Assessment: 0-10 Pain Score: 5  Pain Type: Acute pain Pain Location: Leg Pain Orientation: Left Pain Descriptors / Indicators: Aching Pain Frequency: Constant Patients Stated Pain Goal: 3 Pain Intervention(s): Medication (See eMAR);Repositioned Multiple Pain Sites: No ADL:      See Function Navigator for Current Functional Status.   Therapy/Group: Individual Therapy  Barrett 08/28/2015, 11:43 AM

## 2015-08-28 NOTE — Progress Notes (Addendum)
Physical Therapy Session Note  Patient Details  Name: [name] MRN: [id number] Date of Birth: 1956-06-12  Today's Date: 08/28/2015 PT Individual Time: 1500-1608 PT Individual Time Calculation (min): 68 min   Short Term Goals: Week 1:  PT Short Term Goal 1 (Week 1): Patient will consistently perform pivot transfers with min A PT Short Term Goal 2 (Week 1): Patient will perform sit to stand min A. PT Short Term Goal 3 (Week 1): dynamic standing balance for 3 minutes with min guard A during functional activity PT Short Term Goal 4 (Week 1): Ambulate 10' min A w/ whelchair following PT Short Term Goal 5 (Week 1): mod I w/c mobility in controlled environment  Skilled Therapeutic Interventions/Progress Updates:   Session focused on hands-on family training with wife present and providing appropriate steady assist > supervision for stand pivot transfers using PFRW, ambulation in controlled environment x 35 ft and in home environment x 20 ft in ADL apartment using PFRW with improved speed and safety compared to yesterday, simulated car transfer to SUV height using PFRW, hopping sideways to R and L x 5 ft each direction with PFRW to simulate bathroom entry. Patient propelled wheelchair throughout rehab unit in controlled and home environments > 300 ft with mod I. Practiced sit <> stand transfer from standard arm chair with wife stabilizing chair for safety due to chair flipping out from patient in earlier OT session, supervision overall. Discussed discharge planning and problem solving for access to bathroom with plan to remove bathroom door from hinges and add curtain for privacy for improved safety in narrow space. Patient demonstrated improved eccentric control with stand > sit transfers but fatigued quickly and required multiple rest breaks throughout session. Patient with increased R knee pain toward end of session, RN notified. Patient left sitting in wheelchair with needs within reach and family  present.   Therapy Documentation Precautions:  Precautions Precautions: Fall Precaution Comments: Right toe amputations, increased pain in right knee with mobility and in standing  Restrictions Weight Bearing Restrictions: Yes LUE Weight Bearing: Weight bear through elbow only LLE Weight Bearing: Non weight bearing Pain: Pain Assessment Pain Assessment: Faces Pain Score: 5  Faces Pain Scale: Hurts whole lot Pain Type: Acute pain Pain Location: Knee Pain Orientation: Right Pain Descriptors / Indicators: Aching Pain Onset: On-going Pain Intervention(s): RN made aware;Rest   See Function Navigator for Current Functional Status.   Therapy/Group: Individual Therapy  [name] 08/28/2015, 4:15 PM

## 2015-08-29 ENCOUNTER — Inpatient Hospital Stay (HOSPITAL_COMMUNITY): Payer: Medicaid Other | Admitting: Occupational Therapy

## 2015-08-29 ENCOUNTER — Inpatient Hospital Stay (HOSPITAL_COMMUNITY): Payer: Medicaid Other

## 2015-08-29 LAB — CREATININE, SERUM
CREATININE: 1.19 mg/dL (ref 0.61–1.24)
GFR calc Af Amer: >60 mL/min (ref >60)

## 2015-08-29 LAB — VANCOMYCIN, TROUGH: VANCOMYCIN TR: 15 ug/mL (ref 10.0–20.0)

## 2015-08-29 NOTE — Progress Notes (Signed)
Physical Therapy Session Note  Patient Details  Name: [name] MRN: [id number] Date of Birth: 01-30-1956  Today's Date: 08/29/2015 PT Individual Time: 1340-1410 PT Individual Time Calculation (min): 30 min   Short Term Goals: Week 1:  PT Short Term Goal 1 (Week 1): Patient will consistently perform pivot transfers with min A PT Short Term Goal 2 (Week 1): Patient will perform sit to stand min A. PT Short Term Goal 3 (Week 1): dynamic standing balance for 3 minutes with min guard A during functional activity PT Short Term Goal 4 (Week 1): Ambulate 10' min A w/ whelchair following PT Short Term Goal 5 (Week 1): mod I w/c mobility in controlled environment  Skilled Therapeutic Interventions/Progress Updates:    Session focused on community w/c mobility including navigating over uneven surfaces and up/down ramped incline outdoors to prepare for home entry (ramp) as well as community mobility and addressing overall endurance/strength. Educated on energy conservation techniques and propulsion technique due to limitations in LLE and LUE. Pt required assist on incline of ramp but otherwise supervision in community setting. End of session reviewed HEP handout and answered questions. Thayer Ohm present from [employer] to measure for [employer] extension for R shoe.  Therapy Documentation Precautions:  Precautions Precautions: Fall Precaution Comments: Right toe amputations, increased pain in right knee with mobility and in standing  Restrictions Weight Bearing Restrictions: Yes LUE Weight Bearing: Weight bear through elbow only LLE Weight Bearing: Non weight bearing    Pain: 6/10 pain in LE's - premedicated.   See Function Navigator for Current Functional Status.   Therapy/Group: Individual Therapy  Karolee Stamps [facility] 08/29/2015, 3:33 PM

## 2015-08-29 NOTE — Progress Notes (Signed)
Occupational Therapy Session Note  Patient Details  Name: [name] MRN: [id number] Date of Birth: 08-12-1955  Today's Date: 08/29/2015 OT Individual Time: 1191-4782 OT Individual Time Calculation (min): 60 min    Short Term Goals: Week 1:  OT Short Term Goal 1 (Week 1): Pt will be min assist with LB ADLs using AE prn OT Short Term Goal 2 (Week 1): Pt will be educated on a UB HEP for BUEs and trunk OT Short Term Goal 3 (Week 1): Pt will be supervision with toilet transfers, no cueing needed for correct position of w/c or body during transfers OT Short Term Goal 4 (Week 1): Pt will be min assist for simple meal prep performing sit to/from stands prn/appropriately   Skilled Therapeutic Interventions/Progress Updates:    Pt seen for OT session focusing on ADL and IADL re-training. Pt sitting up in w/c at sink shaving upon arrival. UB Bathing/ dressing completed at sink mod I, steadying assist required when pulling up pants when standing at sink.  Discussed with pt what he worked on in tx sessions yestedray and problem Building services engineer. Suggested placing grab bar by toilet so pt can pushup from grab bar vs. Side of BSC and reduce risk of BSC flipping over, pt voiced agreement and will look into it.   He self propelled w/c to ADL apartment and completed kitchen mobility from w/c level. Discussed pros and cons of standing at counter vs. Completing cooking tasks at w/c level. Pt in agreement that completing task in w/c is safer due to decreased standing balance esp without PFRW. Pt practiced gathering items from w/c level, under cabinets and from refrigerator, min cuing for problem solving. Will practice real cooking task at another tx session. Pt self propelled w/c back to room at end of session, demonstrating improved activity tolerance, able to propel entire way back to room without need for rest break. Pt left set-up with meal tray at end of session, all needs in reach.     Therapy Documentation Precautions:  Precautions Precautions: Fall Precaution Comments: Right toe amputations, increased pain in right knee with mobility and in standing  Restrictions Weight Bearing Restrictions: Yes LUE Weight Bearing: Weight bear through elbow only LLE Weight Bearing: Non weight bearing Pain: Pain Assessment Pain Assessment: 0-10 Pain Score: 5  Pain Type: Acute pain Pain Location: Knee Pain Orientation: Right Pain Descriptors / Indicators: Aching Pain Frequency: Constant Pain Onset: On-going Patients Stated Pain Goal: 3 Pain Intervention(s): Repositioned  See Function Navigator for Current Functional Status.   Therapy/Group: Individual Therapy  [name], Monia Timmers C 08/29/2015, 7:14 AM

## 2015-08-29 NOTE — Progress Notes (Signed)
[facility]     PROGRESS NOTE    Subjective/Complaints: No new issues. Aware that he will need abx at home. Pain controlled.   ROS: Pt denies fever, rash/itching, headache, blurred or double vision, nausea, vomiting, abdominal pain, diarrhea, chest pain, shortness of breath, palpitations, dysuria, dizziness, neck or back pain, bleeding, anxiety, or depression   Objective: Vital Signs: Blood pressure 114/61, pulse 70, temperature 97.9 F (36.6 C), temperature source Oral, resp. rate 16, height  (1.905 m), weight 109.589 kg (241 lb 9.6 oz), SpO2 98 %. No results found. No results for input(s): WBC, HGB, HCT, PLT in the last 72 hours. No results for input(s): NA, K, CL, GLUCOSE, BUN, CREATININE, CALCIUM in the last 72 hours.  Invalid input(s): CO CBG (last 3)  No results for input(s): GLUCAP in the last 72 hours.  Wt Readings from Last 3 Encounters:  08/28/15 109.589 kg (241 lb 9.6 oz)  08/18/15 100.6 kg (221 lb 12.5 oz)  08/04/15 99.791 kg (220 lb)    Physical Exam:  Constitutional: He is oriented to person, place, and time. He appears well-developed and well-nourished.  HENT:  Head: Normocephalic and atraumatic.  Eyes: Conjunctivae and EOM are normal.  Neck: Normal range of motion. Neck supple. No thyromegaly present.  Cardiovascular: Normal rate and regular rhythm.  Respiratory: Effort normal and breath sounds normal. No respiratory distress.  GI: Soft. Bowel sounds are normal. He exhibits no distension.  Musculoskeletal: He exhibits edema and tenderness.  Neurological: He is alert and oriented to person, place, and time.  Motor: RUE 5/5 proximal to distal LUE 4/5 shoulder abduction, 4-/5 elbow flexion/extension, 4- to 4/5 hand grip RLE: hip flexion, knee extension 4+/5, ankle dorsi/plantar flexion 4+/5 LLE: hip flexion 4/5, ankle dorsi/plantar flexion 4+/5  Sensation diminished symetrically distal to knees  Skin: Skin is  warm and dry. Old right first toe amp. All incisions clean and dry. No drainage/erythema  Left upper extremity with dressing in place. Wrist remains tender. Wearing splint  Left lower extremity surgical dressing in place . Incision clean and intact without drainage. Psychiatric: His speech is normal. Judgment normal. Cognition and memory are normal   Assessment/Plan: 1. Mobility/functional deficits secondary to left tibia fracture/osteomyelitis which require 3+ hours per day of interdisciplinary therapy in a comprehensive inpatient rehab setting. Physiatrist is providing close team supervision and 24 hour management of active medical problems listed below. Physiatrist and rehab team continue to assess barriers to discharge/monitor patient progress toward functional and medical goals.  Function:  Bathing Bathing position   Position: Wheelchair/chair at sink  Bathing parts Body parts bathed by patient: Right arm, Left arm, Chest, Abdomen, Front perineal area, Buttocks, Right upper leg, Left upper leg, Right lower leg, Left lower leg Body parts bathed by helper: Back  Bathing assist Assist Level: Supervision or verbal cues   Set up : To obtain items  Upper Body Dressing/Undressing Upper body dressing   What is the patient wearing?: Pull over shirt/dress     Pull over shirt/dress - Perfomed by patient: Thread/unthread right sleeve, Put head through opening, Thread/unthread left sleeve, Pull shirt over trunk Pull over shirt/dress - Perfomed by helper: Thread/unthread left sleeve, Pull shirt over trunk        Upper body assist Assist Level: No help, No cues   Set up : To obtain clothing/put away  Lower Body Dressing/Undressing Lower body dressing   What is the patient wearing?: AFO, [address], Socks, Pants, Shoes Underwear -  Performed by patient: Thread/unthread right underwear leg, Thread/unthread left underwear leg Underwear - Performed by helper: Pull underwear up/down Pants-  Performed by patient: Thread/unthread right pants leg, Thread/unthread left pants leg, Pull pants up/down Pants- Performed by helper: Thread/unthread left pants leg, Pull pants up/down     Socks - Performed by patient:  (L n/a) Socks - Performed by helper: Don/doff left sock Shoes - Performed by patient: Don/doff right shoe, Fasten right   AFO - Performed by patient: Don/doff left AFO AFO - Performed by helper: Don/doff left AFO TED Hose - Performed by patient: Don/doff right TED hose, Don/doff left TED hose TED Hose - Performed by helper: Don/doff right TED hose, Don/doff left TED hose  Lower body assist Assist for lower body dressing: Touching or steadying assistance (Pt > 75%)   Set up : To obtain clothing/put away  Toileting Toileting Toileting activity did not occur:  (did not occur during eval) Toileting steps completed by patient: Adjust clothing prior to toileting, Performs perineal hygiene, Adjust clothing after toileting Toileting steps completed by helper: Adjust clothing prior to toileting, Performs perineal hygiene Toileting Assistive Devices: Grab bar or rail  Toileting assist Assist level: Touching or steadying assistance (Pt.75%)   Transfers Chair/bed transfer   Chair/bed transfer method: Stand pivot Chair/bed transfer assist level: Touching or steadying assistance (Pt > 75%) Chair/bed transfer assistive device: Armrests, Patent attorney     Max distance: 30 Assist level: Touching or steadying assistance (Pt > 75%)   Wheelchair   Type: Manual Max wheelchair distance: 200 Assist Level: No help, No cues, assistive device, takes more than reasonable amount of time  Cognition Comprehension Comprehension assist level: Follows complex conversation/direction with no assist  Expression Expression assist level: Expresses complex ideas: With no assist  Social Interaction Social Interaction assist level: Interacts appropriately with others - No  medications needed.  Problem Solving Problem solving assist level: Solves complex problems: Recognizes & self-corrects  Memory Memory assist level: Complete Independence: No helper  Medical Problem List and Plan: 1. Left distal third comminuted tibia shaft fracture secondary to fall. Status post ORIF 08/17/2015. Nonweightbearing 6 weeks  -continue CIR therapies   -left wrist pain  improving 2. DVT Prophylaxis/Anticoagulation: Eliquis for history of SVT. 3. Pain Management/chronic pain management: MS Contin 90 mg every 12 hours, Neurontin 100 mg 3 times a day, Cymbalta 60 mg daily, Robaxin and oxycodone  q4 prn----may have up to 5 x per day  -improved pain control 4. Mood: Effexor 75 mg daily. 5. Neuropsych: This patient is capable of making decisions on his own behalf. 6. Skin/Wound Care: Routine skin checks. Wounds clean without signs of infection  -remove staples LLE tomorrow 7. Fluids/Electrolytes/Nutrition: encourage po. 8. Chronic left wrist osteomyelitis/MRSA. Status post recent I&D per Dr.Coley---will need vanc at discharge with plan to determine length of therapy at outpt appt 1 week post discharge  -wbc/hgb are stable to improved. 9. History of SVT. Cardiac rate controlled. Continue Tambocor 100 mg every 12 hours, Lopressor 75 mg twice a day 10. Chronic diastolic congestive heart failure. Weight 104.7kg today 11. Hyperlipidemia. Lipitor 12. 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

## 2015-08-29 NOTE — Progress Notes (Signed)
Physical Therapy Session Note  Patient Details  Name: [name] MRN: [id number] Date of Birth: 13-Jun-1956  Today's Date: 08/29/2015 PT Individual Time: 0915-1030 PT Individual Time Calculation (min): 75 min   Short Term Goals: Week 1:  PT Short Term Goal 1 (Week 1): Patient will consistently perform pivot transfers with min A PT Short Term Goal 2 (Week 1): Patient will perform sit to stand min A. PT Short Term Goal 3 (Week 1): dynamic standing balance for 3 minutes with min guard A during functional activity PT Short Term Goal 4 (Week 1): Ambulate 10' min A w/ whelchair following PT Short Term Goal 5 (Week 1): mod I w/c mobility in controlled environment  Skilled Therapeutic Interventions/Progress Updates:    Session focused on d/c planning, w/c mobility on unit and in ADL apartment setting to simulate home environment including management of legrest at mod I level, therapeutic activity in kitchen to prepare and cook a basic meal (frying an egg) including clean-up at supervision to mod I level, education and discussion on energy conservation techniques and kitchen set-up to increase ease of access from w/c, gait training on carpeted surface to simulate home environment mobility including lateral side stepping/hopping for access to bathroom, and basic transfer back to bed at end of session at supervision level. 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 Handout created and given for LE and core strengthening exercises that have been done with patient during his stay and review in future session prior to d/c. Positioned in bed with all needs in reach.   Therapy Documentation Precautions:  Precautions Precautions: Fall Precaution Comments: Right toe amputations, increased pain in right knee with mobility and in standing  Restrictions Weight Bearing Restrictions: Yes LUE Weight Bearing: Weight bear through  elbow only LLE Weight Bearing: Non weight bearing  Pain: Premedicated for LE pain.    See Function Navigator for Current Functional Status.   Therapy/Group: Individual Therapy  Karolee Stamps Darrol Poke, PT, DPT  08/29/2015, 12:04 PM

## 2015-08-29 NOTE — Patient Care Conference (Signed)
Inpatient RehabilitationTeam Conference and Plan of Care Update Date: 08/27/2015   Time: 2:05 PM    Patient Name: [name]      Medical Record Number: [id number]  Date of Birth: 24-Jan-1956 Sex: Male         Room/Bed: 4W25C/4W25C-01 Payor Info: Payor: MEDICAID [place] / Plan: MEDICAID [place] ACCESS / Product Type: *No Product type* /    Admitting Diagnosis: L tibia fx  Admit Date/Time:  08/21/2015  3:29 PM Admission Comments: No comment available   Primary Diagnosis:  Fracture of left tibia Principal Problem: Fracture of left tibia  Patient Active Problem List   Diagnosis Date Noted  . Abnormality of gait 08/21/2015  . Fracture of left tibia 08/21/2015  . HLD (hyperlipidemia)   . Chronic diastolic congestive heart failure (HCC)   . Depression   . Post-operative pain   . Essential hypertension   . Paroxysmal SVT (supraventricular tachycardia) (HCC)   . Osteomyelitis of left wrist (HCC)   . History of amputation of lesser toe of right foot (HCC)   . Fracture of shaft of left tibia 08/15/2015  . Fracture, tibia, shaft 08/15/2015  . Acute osteomyelitis of right radius (HCC)   . Osteomyelitis (HCC) 07/05/2015  . Paroxysmal atrial fibrillation (HCC)   . 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 Wound of right ankle 06/05/2015  . Status post amputation of great toe (HCC)   . Pain   . Swelling   . Warmth of joint   . Mycotic aneurysm   . Thigh abscess   . Hand abscess   . Septic embolism (HCC)   . Gangrene of toe (HCC)   . Acute osteomyelitis of left ulna (HCC)   . Streptococcal arthritis of left wrist (HCC)   . Dry gangrene (HCC)   . Wound, open, foot with complication   . Acute deep vein thrombosis (DVT) of distal end of right lower extremity (HCC)   . Left wrist fracture   . Abscess of bursa, left wrist   . SVT (supraventricular tachycardia) (HCC) 05/08/2015  . 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 Adjustment  disorder with depressed mood   . 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 Debility 05/01/2015  . Sepsis (HCC) 04/30/2015  . Acute renal failure with tubular necrosis (HCC)   . Abscess of left hand   . Staphylococcus aureus bacteremia with sepsis (HCC)   . Colles' fracture of left radius   . Cellulitis of left upper extremity   . Diastolic dysfunction   . Nondisplaced fracture of distal end of left radius   . Abscess   . Bacteremia due to Staphylococcus   . Pulmonary hypertension (HCC)   . Acute blood loss anemia   . Aneurysm of right femoral artery (HCC)   . DVT, lower extremity (HCC)   . Radius distal fracture   . Cellulitis of finger of left hand   . Acute pyelonephritis   . Arm pain, anterior   . Cellulitis   . Bacteremia due to Staphylococcus aureus   . Acute renal failure syndrome (HCC)   . Fracture of left radius   . ARF (acute renal failure) (HCC) 04/07/2015  . Severe sepsis (HCC) 04/07/2015  . Cellulitis of left hand 04/07/2015  . Elevated INR 04/07/2015  . Foot swelling 04/07/2015  . Elevated bilirubin 04/07/2015  . Hypokalemia 04/07/2015  . Hypomagnesemia 04/07/2015  . 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 Bacteremia  04/07/2015  . UTI (lower urinary tract infection)   . Chest pain 11/15/2013  . History of duodenal ulcer 11/15/2013  . 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 Septic shock due to abscess 07/13/2013  . Chronic use of opiate drugs therapeutic purposes 07/12/2013  . Acute renal failure (HCC) 07/11/2013  . Chronic pain syndrome 07/11/2013  . Testosterone deficiency 07/11/2013    Expected Discharge Date: Expected Discharge Date: 08/31/15  Team Members Present: Physician leading conference: Dr. Faith Rogue Social Worker Present: Staci Acosta, LCSW Nurse Present: Other (comment) Cheri Guppy, RN) PT Present: Teodoro Kil, PT OT Present: Johnsie Cancel, OT     Current Status/Progress Goal Weekly Team Focus  Medical    left wrist osteo, left distal radius fx s/p ORIF  pain control to allow activity tolerance  wound care, pain   Bowel/Bladder   continent b/b LBM 1-29 miralax colace   min assist   monitor effectiveness of medication   Swallow/Nutrition/ Hydration             ADL's   Min A LB dressing; supervision- min functional transfers; supervision- min dynamic standing balance  Supervision overall  Functional transfrs, activity tolerance, family ed. d/c planning   Mobility   min assist overall for tranfers and gait; supervision w/c mobility; mod I bed mobility  mod I w/c mobility and bed mobility, supervision transfers, and supervision short distance gait  transfers, strengthening, endurance, gait training, balance, family education   Communication             Safety/Cognition/ Behavioral Observations            Pain   morphine  scheduled, oxycodone 20 mg every 4 hours , robaxin 500 mg po every 6 hours  less than 5  monitor pain medications and promote alternatives ways to decrease pain    Skin   left shin incision redness surrounding leg. left wrist with steris intact WNL, right foot with scabbed scar from great toe amputation   no new breakdown   monitor skin for breakdown and educate on skin care     Rehab Goals Patient on target to meet rehab goals: Yes Rehab Goals Revised: none *See Care Plan and progress notes for long and short-term goals.  Barriers to Discharge: pain levels, ortho precautions    Possible Resolutions to Barriers:  adaptive techniques, pain control, safety awareness training    Discharge Planning/Teaching Needs:  Pt will return to his home where his wife and 2 sons will be there to assist him, as needed.  Pt's wife is present often for therapies and pt can direct her in his care.   Team Discussion:  Pt with pain issues, but his wound looks good per Dr. Riley Kill.  Pt was already on pain medications PTA.  Pt is doing well with OT, supervision to min/mod when  fatigued.  PT has mod I goals for pt and min assist for short distance ambulation.  Pt has all needed DME at home already.  Pt will need to continue IV antibiotics at home, as well as PT and OT with home health.    Revisions to Treatment Plan:  none   Continued Need for Acute Rehabilitation Level of Care: The patient requires daily medical management by a physician with specialized training in physical medicine and rehabilitation for the following conditions: Daily direction of a multidisciplinary physical rehabilitation program to ensure safe treatment while eliciting the highest outcome that is of practical value to the patient.: Yes Daily medical management  of patient stability for increased activity during participation in an intensive rehabilitation regime.: Yes Daily analysis of laboratory values and/or radiology reports with any subsequent need for medication adjustment of medical intervention for : Wound care problems;Post surgical problems  Ziona Wickens, [place] Deck 08/29/2015, 2:33 PM

## 2015-08-29 NOTE — Progress Notes (Signed)
Pharmacy Antibiotic Follow-up Note BRYDON SPAHR is a 60 y.o. year-old male admitted on 08/21/2015. He is on vancomycin for progressive osteomyelitis of the left wrist with septic arthritis.  He is followed by ID as outpatient. Per ID note in 1/13, likely duration of 6 weeks.  Vancomycin trough = 15 on  IV q12h.  Renal function relatively stable. Est. crcl ~ 90 ml/min.     Assessment/Plan: Continue vancomycin to 1250 mg Q12 hours SCr q 72h  No results for input(s): WBC in the last 168 hours.  Invalid input(s):  CREATININE   Recent Labs Lab 08/25/15 1025 08/29/15 1720  CREATININE 0.90 1.19    Antimicrobials this admission: 1/18 vancomycin  >>  Levels/dose changes this admission: 1/21 VT: 12 on 1250 mg q12h, increase to 1500 mg q12h  1/23 VT: 18, cont 1500 mg q12h 1/30 VT: 23, decrease to 1250 Q 12h 2/2 VT: 15, continue 1250 q12h  Microbiology results: 1/12 Tissue: ngF 1/8 UCx: ngF  1/8 blood: ngF  07/09/15 L wrist wound: Bacillus species   Thank you for allowing pharmacy to be a part of this patient's care.  [employer], Pharm.D., BCPS Clinical Pharmacist Pager [phone] 08/29/2015 8:15 PM

## 2015-08-29 NOTE — Progress Notes (Signed)
Orthopedic Tech Progress Note Patient Details:  CEVIN RUBINSTEIN 10/16/1955 [id number]  Patient ID: Gerhard Munch, male   DOB: September 21, 1955, 60 y.o.   MRN: [id number]   Saul Fordyce 08/29/2015, 10:11 AMCalled [employer] for [employer] extension.

## 2015-08-29 NOTE — Progress Notes (Signed)
Social Work Patient ID: [name], male   DOB: 05-Jun-1956, 60 y.o.   MRN: [id number]   CSW met with pt and his wife 08-28-15 to update them on team conference discussion.  Pt/wife were pleased to hear he would be d/c'd on Saturday.  Wife and sons will assist pt at home.  Pt has all DME except for the hospital bed that was just returned to [place] two weeks .  Pt's room is upstairs and pt is nonweightbearing for 5 more weeks. Pt has been working with the therapists on sleeping on the sofa at his home.  CSW notified [facility] of pt's d/c date.  CSW will continue to follow and assist as needed.

## 2015-08-29 NOTE — Progress Notes (Signed)
Occupational Therapy Discharge Summary  Patient Details  Name: [name] MRN: [id number] Date of Birth: Dec 08, 1955   Patient has met 9 of 9 long term goals due to improved activity tolerance, improved balance, postural control, ability to compensate for deficits, functional use of  LEFT upper extremity and improved coordination.  Patient to discharge at overall Supervision level.  Patient's care partner is independent to provide the necessary physical assistance at discharge.    Recommendation:  Patient will benefit from ongoing skilled OT services in home health setting to continue to advance functional skills in the area of BADL, iADL and Reduce care partner burden.  Equipment: No equipment provided. Pt has all needed equipment.   Reasons for discharge: treatment goals met and discharge from hospital  Patient/family agrees with progress made and goals achieved: Yes  OT Discharge Precautions/Restrictions  Precautions Precautions: Fall Restrictions Weight Bearing Restrictions: Yes LUE Weight Bearing: Weight bear through elbow only LLE Weight Bearing: Non weight bearing Vision/Perception  Vision- History Baseline Vision/History: Wears glasses Wears Glasses: Reading only Patient Visual Report: No change from baseline Vision- Assessment Vision Assessment?: No apparent visual deficits  Cognition Overall Cognitive Status: Within Functional Limits for tasks assessed Arousal/Alertness: Awake/alert Orientation Level: Oriented X4 Memory: Appears intact Awareness: Appears intact Problem Solving: Appears intact Safety/Judgment: Appears intact Sensation Sensation Light Touch: Impaired Detail Light Touch Impaired Details: Impaired RLE;Impaired LLE;Impaired LUE Proprioception: Appears Intact Coordination Gross Motor Movements are Fluid and Coordinated: Yes Fine Motor Movements are Fluid and Coordinated: No (Impaired L wrist, decreased grip strength) Motor  Motor Motor: Within  Functional Limits Motor - Discharge Observations: WB restrictions/bracing limiting patient's ability Trunk/Postural Assessment  Cervical Assessment Cervical Assessment: Within Functional Limits Thoracic Assessment Thoracic Assessment: Within Functional Limits Lumbar Assessment Lumbar Assessment: Within Functional Limits Postural Control Postural Control: Within Functional Limits  Balance Balance Balance Assessed: Yes Static Sitting Balance Static Sitting - Balance Support: No upper extremity supported;Feet supported Static Sitting - Level of Assistance: 6: Modified independent (Device/Increase time) Dynamic Sitting Balance Dynamic Sitting - Balance Support: Feet supported;During functional activity Dynamic Sitting - Level of Assistance: 6: Modified independent (Device/Increase time) Static Standing Balance Static Standing - Balance Support: Right upper extremity supported;Left upper extremity supported Static Standing - Level of Assistance: Not tested (comment);6: Modified independent (Device/Increase time) Dynamic Standing Balance Dynamic Standing - Balance Support: During functional activity;Right upper extremity supported;Left upper extremity supported Dynamic Standing - Level of Assistance: 5: Stand by assistance Extremity/Trunk Assessment RUE Assessment RUE Assessment: Within Functional Limits LUE Assessment LUE Assessment: Exceptions to WFL LUE AROM (degrees) LUE Overall AROM Comments: limited shoulder flexion (~90*) due to h/o rotator cuff surgery~20 years ago. Pt reports this shoulder has been limited for ~6 months to 1 year LUE Strength LUE Overall Strength Comments: shoulder strength grossly 3+/5, elbow strength grossly 4/5   See Function Navigator for Current Functional Status.  Bobby Rumpf, Nyimah Shadduck C 08/30/2015, 3:24 PM

## 2015-08-29 NOTE — Progress Notes (Signed)
Occupational Therapy Session Note  Patient Details  Name: [name] MRN: [id number] Date of Birth: [birth year]  Today's Date: 08/29/2015 OT Individual Time: 9562-1308 OT Individual Time Calculation (min): 30 min    Short Term Goals: Week 1:  OT Short Term Goal 1 (Week 1): Pt will be min assist with LB ADLs using AE prn OT Short Term Goal 2 (Week 1): Pt will be educated on a UB HEP for BUEs and trunk OT Short Term Goal 3 (Week 1): Pt will be supervision with toilet transfers, no cueing needed for correct position of w/c or body during transfers OT Short Term Goal 4 (Week 1): Pt will be min assist for simple meal prep performing sit to/from stands prn/appropriately   Skilled Therapeutic Interventions/Progress Updates:    Treatment session with focus on functional transfers and sit > stand.  Discussed progress towards goals and upcoming d/c.  Engaged in squat pivot/lateral scoot transfers on/off couch with pt requiring min cues for setup and positioning of w/c.  Pt completed second transfer without cues, with therapist stabilizing w/c.  Engaged in sit > stand x3 with use of RW with pt requiring only slight tactile cue at middle of back for stability.  Therapy Documentation Precautions:  Precautions Precautions: Fall Precaution Comments: Right toe amputations, increased pain in right knee with mobility and in standing  Restrictions Weight Bearing Restrictions: Yes LUE Weight Bearing: Weight bear through elbow only LLE Weight Bearing: Non weight bearing Pain: Pain Assessment Pain Assessment: 0-10 Pain Score: 7  Pain Type: Acute pain Pain Location: Knee Pain Orientation: Right Pain Descriptors / Indicators: Aching Pain Frequency: Constant Pain Onset: On-going Patients Stated Pain Goal: 4 Pain Intervention(s): Medication (See eMAR) Multiple Pain Sites: No  See Function Navigator for Current Functional Status.   Therapy/Group: Individual Therapy  Rosalio Loud 08/29/2015, 3:28  PM

## 2015-08-30 ENCOUNTER — Inpatient Hospital Stay (HOSPITAL_COMMUNITY): Payer: Medicaid Other

## 2015-08-30 ENCOUNTER — Inpatient Hospital Stay (HOSPITAL_COMMUNITY): Payer: Medicaid Other | Admitting: Occupational Therapy

## 2015-08-30 MED ORDER — OXYCODONE HCL 30 MG PO TABS: 30.0000 mg | ORAL_TABLET | ORAL | Status: DC | PRN

## 2015-08-30 MED ORDER — ATORVASTATIN CALCIUM 80 MG PO TABS: 80.0000 mg | ORAL_TABLET | Freq: Every day | ORAL | Status: AC

## 2015-08-30 MED ORDER — FLECAINIDE ACETATE 100 MG PO TABS: 100.0000 mg | ORAL_TABLET | Freq: Two times a day (BID) | ORAL | Status: DC

## 2015-08-30 MED ORDER — METHOCARBAMOL 500 MG PO TABS: 500.0000 mg | ORAL_TABLET | Freq: Four times a day (QID) | ORAL | Status: DC | PRN

## 2015-08-30 MED ORDER — METOPROLOL TARTRATE 75 MG PO TABS: 75.0000 mg | ORAL_TABLET | Freq: Two times a day (BID) | ORAL | Status: DC

## 2015-08-30 MED ORDER — DULOXETINE HCL 30 MG PO CPEP: 60.0000 mg | ORAL_CAPSULE | Freq: Every day | ORAL | Status: DC

## 2015-08-30 MED ORDER — GABAPENTIN 100 MG PO CAPS
100.0000 mg | ORAL_CAPSULE | Freq: Three times a day (TID) | ORAL | Status: DC
Start: 2015-08-30 — End: 2017-09-09

## 2015-08-30 MED ORDER — ALTEPLASE 2 MG IJ SOLR
2.0000 mg | Freq: Once | INTRAMUSCULAR | Status: AC
  Administered 2015-08-30: 2 mg
  Filled 2015-08-30: qty 2

## 2015-08-30 MED ORDER — MORPHINE SULFATE ER 30 MG PO TBCR: 90.0000 mg | EXTENDED_RELEASE_TABLET | Freq: Two times a day (BID) | ORAL | Status: DC

## 2015-08-30 MED ORDER — APIXABAN 5 MG PO TABS: 5.0000 mg | ORAL_TABLET | Freq: Two times a day (BID) | ORAL | Status: DC

## 2015-08-30 MED ORDER — VENLAFAXINE HCL ER 75 MG PO CP24: 75.0000 mg | ORAL_CAPSULE | Freq: Every day | ORAL | Status: AC

## 2015-08-30 MED ORDER — DICLOFENAC SODIUM 1 % TD GEL: 2.0000 g | Freq: Four times a day (QID) | TRANSDERMAL | Status: DC

## 2015-08-30 NOTE — Progress Notes (Signed)
Occupational Therapy Session Note  Patient Details  Name: [name] MRN: [id number] Date of Birth: 12-05-1955  Today's Date: 08/30/2015 OT Individual Time: 1115-1200 and 1300-1400 OT Individual Time Calculation (min): 45 min and 60 min    Short Term Goals: Week 1:  OT Short Term Goal 1 (Week 1): Pt will be min assist with LB ADLs using AE prn OT Short Term Goal 2 (Week 1): Pt will be educated on a UB HEP for BUEs and trunk OT Short Term Goal 3 (Week 1): Pt will be supervision with toilet transfers, no cueing needed for correct position of w/c or body during transfers OT Short Term Goal 4 (Week 1): Pt will be min assist for simple meal prep performing sit to/from stands prn/appropriately   Skilled Therapeutic Interventions/Progress Updates:    Session One: Pt seen for OT tx session focusing on functional transfers, LE stretching and education. Pt sitting up in w/c upon arrival, voicing increased fatigue, however, agreeable to tx session. Self propelled w/c to therapy gym and completed squat pivot transfer onto mat supervision- mod I. In supine on mat, pt voiced complaints of LE tightness. Hamstring, calf stretch, and hip flexor stretches completed on B sides. Educated regarding completing stretches at home from long sitting position with pt demonstrating understanding of stretching. Educated on importance of maintaining flexibility and functional implications.  Pt completed 3x squat pivot transfers w/c <> mat with VCs for technique and hand placement.  He self propelled w/c back to room at end of session, left with all needs in reach.  Session two: Pt seen for OT session focusing on ADL re-training, functional standing balance and endurance, and HEP. Pt sitting up in w/c upon arrival, agreeable to tx session. He completed sit <> stand at PFRW to complete toilet transfer at supervision level.  Pt voiced that w/c parts were coming off on personal w/c he has here on unit. CSW made aware and  representative from [facility] to change out w/c prior to d/c. In gym, pt stood at PFRW to complete fine motor task with L UE focusing on in-hand manipulation. Pt tolerated ~1-2 minutes of static standing before requesting seated rest break. Completed x2 trials with seated rest break provided btwn trials.  In sitting, scapular ROM provided to B UEs with pt voicing increased tightness and discomfort resulting from increased pressure through weightbearing on B sides. Pt provided with pink theraputty and exercise sheet provided for instruction of home exercises. Recommend pt icing L wrist following activity as edema noted in L wrist.  Pt returned to room at end of session, left in supine with all needs in reach. Educated pt not to transfer with just TED hose on to reduce fall risk. Discussed energy conservation and taking rest breaks before needed vs risking chance of fall.   Therapy Documentation Precautions:  Precautions Precautions: Fall Precaution Comments: Right toe amputations, increased pain in right knee with mobility and in standing  Restrictions Weight Bearing Restrictions: Yes LUE Weight Bearing: Weight bear through elbow only LLE Weight Bearing: Non weight bearing Pain: Pain Assessment Pain Score: 6   See Function Navigator for Current Functional Status.   Therapy/Group: Individual Therapy  [name], Geneva Pallas C 08/30/2015, 7:13 AM

## 2015-08-30 NOTE — Discharge Instructions (Signed)
Inpatient Rehab Discharge Instructions  [name] Discharge date and time: No discharge date for patient encounter.   Activities/Precautions/ Functional Status: Activity: Nonweightbearing left lower extremity and weightbearing as tolerated at elbow only Diet: Regular Wound Care: Keep wound clean and dry Functional status:  ___ No restrictions     ___ Walk up steps independently ___ 24/7 supervision/assistance   ___ Walk up steps with assistance ___ Intermittent supervision/assistance  ___ Bathe/dress independently ___ Walk with walker     ___ Bathe/dress with assistance ___ Walk Independently    ___ Shower independently _x__ Walk with assistance    ___ Shower with assistance ___ No alcohol     ___ Return to work/school ________  Special Instructions:    My questions have been answered and I understand these instructions. I will adhere to these goals and the provided educational materials after my discharge from the hospital.  Patient/Caregiver Signature _______________________________ Date __________  Clinician Signature _______________________________________ Date __________  Please bring this form and your medication list with you to all your follow-up doctor's appointments. Inpatient Rehab Discharge Instructions  [name] Discharge date and time: No discharge date for patient encounter.   Activities/Precautions/ Functional Status: Activity: Nonweightbearing left lower extremity weightbearing as tolerated at elbow only Diet: regular diet Wound Care: keep wound clean and dry Functional status:  ___ No restrictions     ___ Walk up steps independently ___ 24/7 supervision/assistance   ___ Walk up steps with assistance ___ Intermittent supervision/assistance  ___ Bathe/dress independently ___ Walk with walker     ___ Bathe/dress with assistance ___ Walk Independently    ___ Shower independently ___ Walk with assistance    ___ Shower with assistance ___ No  alcohol     ___ Return to work/school ________  COMMUNITY REFERRALS UPON DISCHARGE:   Home Health:   PT     OT     RN  Agency:  [facility] Phone:  [phone] Medical Equipment/Items Ordered:  You have all recommended equipment at home.  Special Instructions:  Follow-up with [facility] [phone] in regards to duration of vancomycin antibiotic care  My questions have been answered and I understand these instructions. I will adhere to these goals and the provided educational materials after my discharge from the hospital.  Patient/Caregiver Signature _______________________________ Date __________  Clinician Signature _______________________________________ Date __________  Please bring this form and your medication list with you to all your follow-up doctor's appointments.

## 2015-08-30 NOTE — Progress Notes (Signed)
Occupational Therapy Session Note  Patient Details  Name: [name] MRN: [id number] Date of Birth: March 12, 1956  Today's Date: 08/30/2015 OT Individual Time: 9447-3958 OT Individual Time Calculation (min): 45 min    Short Term Goals: Week 1:  OT Short Term Goal 1 (Week 1): Pt will be min assist with LB ADLs using AE prn OT Short Term Goal 2 (Week 1): Pt will be educated on a UB HEP for BUEs and trunk OT Short Term Goal 3 (Week 1): Pt will be supervision with toilet transfers, no cueing needed for correct position of w/c or body during transfers OT Short Term Goal 4 (Week 1): Pt will be min assist for simple meal prep performing sit to/from stands prn/appropriately       Skilled Therapeutic Interventions/Progress Updates:    Pt seen this session for skilled OT to facilitate UE AROM and strength needed for functional mobility with reviewing pt's HEP. Pt practiced his HEP with a main focus on LUE AROM exercises of pendulums for L shoulder, isometric/concentric AROM tricep, L finger coordination. Also made additional recommendations to his existing exercises program of variations for trunk and leg exercises. Reviewed all of pt's OT to goals to ensure he has met his goals and pt is ready for discharge tomorrow.  Pt in room with all needs met.  Therapy Documentation Precautions:  Precautions Precautions: Fall Precaution Comments: Right toe amputations, increased pain in right knee with mobility and in standing  Restrictions Weight Bearing Restrictions: Yes LUE Weight Bearing: Weight bear through elbow only LLE Weight Bearing: Non weight bearing     Pain: Pain Assessment Pain Assessment: 0-10 Pain Score: 5  Pain Type: Acute pain Pain Location: Knee Pain Orientation: Right Pain Descriptors / Indicators: Aching Pain Frequency: Constant Pain Onset: On-going Patients Stated Pain Goal: 4 Pain Intervention(s): Medication (See eMAR) Multiple Pain Sites: No ADL:   See Function Navigator  for Current Functional Status.   Therapy/Group: Individual Therapy  [place] 08/30/2015, 11:45 AM

## 2015-08-30 NOTE — Progress Notes (Addendum)
[facility]     PROGRESS NOTE    Subjective/Complaints: Feeling well. Pain manageable. Very positive about progress.   ROS: Pt denies fever, rash/itching, headache, blurred or double vision, nausea, vomiting, abdominal pain, diarrhea, chest pain, shortness of breath, palpitations, dysuria, dizziness, neck or back pain, bleeding, anxiety, or depression   Objective: Vital Signs: Blood pressure 109/70, pulse 70, temperature 97.9 F (36.6 C), temperature source Oral, resp. rate 18, height  (1.905 m), weight 108.4 kg (238 lb 15.7 oz), SpO2 99 %. No results found. No results for input(s): WBC, HGB, HCT, PLT in the last 72 hours.  Recent Labs  08/29/15 1720  CREATININE 1.19   CBG (last 3)  No results for input(s): GLUCAP in the last 72 hours.  Wt Readings from Last 3 Encounters:  08/30/15 108.4 kg (238 lb 15.7 oz)  08/18/15 100.6 kg (221 lb 12.5 oz)  08/04/15 99.791 kg (220 lb)    Physical Exam:  Constitutional: He is oriented to person, place, and time. He appears well-developed and well-nourished.  HENT:  Head: Normocephalic and atraumatic.  Eyes: Conjunctivae and EOM are normal.  Neck: Normal range of motion. Neck supple. No thyromegaly present.  Cardiovascular: Normal rate and regular rhythm.  Respiratory: Effort normal and breath sounds normal. No respiratory distress.  GI: Soft. Bowel sounds are normal. He exhibits no distension.  Musculoskeletal: He exhibits edema and tenderness.  Neurological: He is alert and oriented to person, place, and time.  Motor: RUE 5/5 proximal to distal LUE 4/5 shoulder abduction, 4-/5 elbow flexion/extension, 4- to 4/5 hand grip RLE: hip flexion, knee extension 4+/5, ankle dorsi/plantar flexion 4+/5 LLE: hip flexion 4/5, ankle dorsi/plantar flexion 4+/5  Sensation diminished symetrically distal to knees in stocking glove distribution.   Skin: Skin is warm and dry. Old right first toe amp.  Left leg incision,?central sanginous drainage, skin generally moist too. No odor. Some exfoliation of the skin noted along lateral aspect also. Left upper extremity with dressing in place. Wrist remains tender. Wearing splint  Left lower extremity surgical dressing in place . Incision clean and intact without drainage. Psychiatric: His speech is normal. Judgment normal. Cognition and memory are normal   Assessment/Plan: 1. Mobility/functional deficits secondary to left tibia fracture/osteomyelitis which require 3+ hours per day of interdisciplinary therapy in a comprehensive inpatient rehab setting. Physiatrist is providing close team supervision and 24 hour management of active medical problems listed below. Physiatrist and rehab team continue to assess barriers to discharge/monitor patient progress toward functional and medical goals.  Function:  Bathing Bathing position   Position: Wheelchair/chair at sink  Bathing parts Body parts bathed by patient: Right arm, Left arm, Chest, Abdomen, Front perineal area, Buttocks, Right upper leg, Left upper leg, Right lower leg, Left lower leg Body parts bathed by helper: Back  Bathing assist Assist Level: Supervision or verbal cues   Set up : To obtain items  Upper Body Dressing/Undressing Upper body dressing   What is the patient wearing?: Pull over shirt/dress     Pull over shirt/dress - Perfomed by patient: Thread/unthread right sleeve, Put head through opening, Thread/unthread left sleeve, Pull shirt over trunk Pull over shirt/dress - Perfomed by helper: Thread/unthread left sleeve, Pull shirt over trunk        Upper body assist Assist Level: No help, No cues   Set up : To obtain clothing/put away  Lower Body Dressing/Undressing Lower body dressing   What is the patient wearing?: AFO, Faythe Dingwall,  Socks, Pants, Shoes Underwear - Performed by patient: Thread/unthread right underwear leg, Thread/unthread left underwear leg Underwear -  Performed by helper: Pull underwear up/down Pants- Performed by patient: Thread/unthread right pants leg, Thread/unthread left pants leg, Pull pants up/down Pants- Performed by helper: Thread/unthread left pants leg, Pull pants up/down     Socks - Performed by patient:  (L n/a) Socks - Performed by helper: Don/doff left sock Shoes - Performed by patient: Don/doff right shoe, Fasten right   AFO - Performed by patient: Don/doff left AFO AFO - Performed by helper: Don/doff left AFO TED Hose - Performed by patient: Don/doff right TED hose, Don/doff left TED hose TED Hose - Performed by helper: Don/doff right TED hose, Don/doff left TED hose  Lower body assist Assist for lower body dressing: Touching or steadying assistance (Pt > 75%)   Set up : To obtain clothing/put away  Toileting Toileting Toileting activity did not occur:  (did not occur during eval) Toileting steps completed by patient: Adjust clothing prior to toileting, Performs perineal hygiene, Adjust clothing after toileting Toileting steps completed by helper: Adjust clothing prior to toileting, Performs perineal hygiene Toileting Assistive Devices: Grab bar or rail  Toileting assist Assist level: Touching or steadying assistance (Pt.75%)   Transfers Chair/bed transfer   Chair/bed transfer method: Squat pivot Chair/bed transfer assist level: No Help, no cues, assistive device, takes more than a reasonable amount of time Chair/bed transfer assistive device: Armrests, Orthosis     Locomotion Ambulation     Max distance: 31 Assist level: Supervision or verbal cues   Wheelchair   Type: Manual Max wheelchair distance: 250 Assist Level: No help, No cues, assistive device, takes more than reasonable amount of time  Cognition Comprehension Comprehension assist level: Follows complex conversation/direction with no assist  Expression Expression assist level: Expresses complex ideas: With no assist  Social Interaction Social  Interaction assist level: Interacts appropriately with others - No medications needed.  Problem Solving Problem solving assist level: Solves complex problems: Recognizes & self-corrects  Memory Memory assist level: Complete Independence: No helper  Medical Problem List and Plan: 1. Left distal third comminuted tibia shaft fracture secondary to fall. Status post ORIF 08/17/2015. Nonweightbearing 6 weeks  -finalize dc planning for tomorrow 2. DVT Prophylaxis/Anticoagulation: Eliquis for history of SVT. 3. Pain Management/chronic pain management: MS Contin 90 mg every 12 hours, Neurontin 100 mg 3 times a day, Cymbalta 60 mg daily, Robaxin and oxycodone 30mg  q4 prn----may have up to 5 x per day  -improved pain control 4. Mood: Effexor 75 mg daily. 5. Neuropsych: This patient is capable of making decisions on his own behalf. 6. Skin/Wound Care:    -there may be some residual drainage from wound (has original foam dressing in place)   -would like to change to dry dressing today & if wound dry in the morning, then sutures can be dc'ed.  If not, then he'll need to follow up with ortho for removal.  7. Fluids/Electrolytes/Nutrition: encourage po. 8. Chronic left wrist osteomyelitis/MRSA. Status post recent I&D per Dr.Coley---will need vanc at discharge with plan to determine length of therapy at outpt appt 1 week post discharge  -wbc/hgb are stable to improved. 9. History of SVT. Cardiac rate controlled. Continue Tambocor 100 mg every 12 hours, Lopressor 75 mg twice a day 10. Chronic diastolic congestive heart failure. Weight 104.7kg today 11. Hyperlipidemia. Lipitor 12. 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

## 2015-08-30 NOTE — Discharge Summary (Signed)
[name], [name] NO.:  [id number]  MEDICAL RECORD NO.:  [id number]  LOCATION:  [id number]                        FACILITY:  [facility]  PHYSICIAN:  [name], Craig WeberDATE OF BIRTH:  Oct 11, 1955  DATE OF ADMISSION:  08/21/2015 DATE OF DISCHARGE:  08/30/2015                              DISCHARGE SUMMARY   DISCHARGE DIAGNOSES: 1. Left distal third comminuted tibia shaft fracture secondary to fall     with open reduction and internal fixation. 2. Eliquis for history of supraventricular tachycardia. 3. Chronic pain management. 4. 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 5. Chronic diastolic congestive heart failure. 6. Hyperlipidemia. 7. History of toe amputation on the right.  HISTORY OF PRESENT ILLNESS:  This is a 60 year old right-handed male with history of hypertension, SVT, chronic diastolic congestive heart failure, chronic pain management as well as chronic left wrist osteomyelitis with MRSA, recent irrigation and debridement, maintained on vancomycin at home.  Followed by Dr. Izora [name], and first and second toe amputations in October 2016.  He lives with his wife and family.  Two- level home with bedroom upstairs.  Well known to rehab services from October 2016 for sepsis.  He presented on August 15, 2015 after a fall down some stairs when he tripped.  Denied loss of consciousness.  X-rays and imaging revealed left distal third comminuted tibia shaft fracture. Underwent ORIF on August 17, 2015 per Dr. Magnus [name].  Nonweightbearing left lower extremity X6 weeks and weightbearing through the left elbow only for history of left wrist osteomyelitis.  HOSPITAL COURSE:  Chronic pain management.  Currently, remains on intravenous vancomycin for recent irrigation and debridement of left wrist, followed by Dr. Izora [name].  Physical and occupational therapy ongoing.  The patient was  admitted for a comprehensive rehab program.  PAST MEDICAL HISTORY:  See discharge diagnoses.  SOCIAL HISTORY:  Lives with family.  Independent with a cane prior to admission.  FUNCTIONAL STATUS:  Upon admission to rehab services was +2 physical assist, stand pivot transfers, squat pivot transfers, minimal guard sit- to-supine, min mod assist activities of daily living.  PHYSICAL EXAMINATION:  VITAL SIGNS:  Blood pressure 107/85, pulse 85, temperature 98, respirations 18. GENERAL:  This was an alert male, oriented x3. LUNGS:  Clear to auscultation without wheeze. CARDIAC:  Regular rate and rhythm without murmur. ABDOMEN:  Soft, nontender.  Good bowel sounds. EXTREMITIES:  Left upper extremity dressing in place, appropriately tender.  Left lower extremity surgical dressing. NEUROVASCULAR:  Sensation intact.  REHABILITATION HOSPITAL COURSE:  The patient was admitted to inpatient rehab services with therapies initiated on a 3-hour daily basis, consisting of physical therapy, occupational therapy, and rehabilitation nursing.  The following issues were addressed during the patient's rehabilitation stay.  Pertaining to Mr. Duer left distal third comminuted tibia shaft fracture, he had undergone ORIF on August 17, 2015.  He would follow up with Orthopedic Services.  Neurovascular sensation intact.  He was nonweightbearing x6 weeks.  He remained on Eliquis for history of SVT.  Blood pressures well controlled.  Chronic pain management with regimen of MS Contin, Neurontin, and oxycodone for  breakthrough pain as well as scheduled Cymbalta, and well maintained. Chronic left wrist osteomyelitis MRSA, recent irrigation and debridement per Dr. Izora [name], orthopedic Services.  The patient will need to remain on vancomycin at discharge.  Followup 1 week outpatient with [facility]opedic Services to determine length of antibiotic care.  The patient remained afebrile.  He exhibited no signs of fluid overload.   He had no shortness of breath or chest pain.  The patient received weekly collaborative interdisciplinary team conferences to discuss estimated length of stay, family teaching, and any barriers to discharge.  Sessions focused on community wheelchair mobility including navigating over uneven surfaces, up and down ramp, incline outdoors to prepare for home entry.  Educated on energy conservation techniques and propulsion techniques due to limitations of left lower and left upper extremity.  Occupational therapy, activities of daily living, focused on transfer, sit to stand. Engaged in squat pivot lateral scoot transfers off and on the couch requiring minimal cues for set up and positioning of wheelchair.  Engage in sit-to-stand x3 rolling walker, requiring some tactile cues.  Full family teaching was completed and plan discharged to home with ongoing therapies dictated per Rehab services.  DISCHARGE MEDICATIONS:  Eliquis 5 mg p.o. b.i.d., Lipitor 80 mg p.o. daily, Voltaren gel 4 times daily to affected area, Colace 100 mg p.o. b.i.d., Tambocor 100 mg p.o. every 12 hours, Neurontin 100 mg p.o. t.i.d., Robaxin 500 mg every 6 hours as needed for muscle spasms, Lopressor 75 mg p.o. b.i.d., MS Contin 90 mg p.o. every 12 hours, multivitamin daily, oxycodone immediate release 30 mg every 4 hours as needed moderate pain, MiraLax 17 g daily hold for loose stools, vancomycin 1250 mg intravenously every 12 hours until followup with Dr. Izora [name], Effexor 75 mg p.o. daily.  DIET:  Regular consistency diet.  FOLLOWUP:  The patient would follow up Dr. Faith [name] at the outpatient rehab service office as needed; Dr. Izora [name] 1 week, call for appointment; Dr. Doneen [name] 2 weeks, call for appointment; Dr. Kathlene [name] Dr. Georgena [name], Medical Management.  The patient was nonweightbearing left upper extremity and weightbearing as tolerated at elbow only.  A home health nurse had been arranged to provide  vancomycin until followup with Dr. Izora [name].     [name], P.A.   ______________________________ [name], M.D.    DA/MEDQ  D:  08/30/2015  T:  08/30/2015  Job:  [id number]  cc:   Craig Abraham, [name] [name] Dr. Judye [name], M.D. [name], M.D.

## 2015-08-30 NOTE — Discharge Summary (Signed)
Discharge summary job [id number]

## 2015-08-30 NOTE — Progress Notes (Signed)
Physical Therapy Discharge Summary  Patient Details  Name: [name] MRN: [id number] Date of Birth: 04/19/1956  Today's Date: 08/30/2015 PT Individual Time: 0800-0857 PT Individual Time Calculation (min): 57 min  Premedicated for pain. Session focused on preparing for d/c completing grad day activities and final d/c assessment. Pt performed simulated car transfer to SUV height at supervision level for safety and cues for hand placement, bed <> w/c transfers in ADL apartment at mod I level, Furniture transfer to low couch at supervision level for safety due to uneven surface, w/c mobility on unit at mod I level with extra time, gait training with PFRW x 31' (with morton's extension in shoe on R and pt reporting improved comfort) at supervision level, up/down 1 step backwards with PFRW with min assist with cues for technique, and reviewed LE stretches for improved knee extension. Instructed in hamstring stretch (therapist providing stretch using contract- relax technique x 3 reps each) and self stretching with sheet x 3 reps each as well as quad sets x 10 reps each.  Patient has met 10 of 10 long term goals due to improved activity tolerance, improved balance, increased strength, decreased pain and ability to compensate for deficits.  Patient to discharge at a wheelchair level supervision to modified independent. Pt able to perform gait short distances at supervision level..   Patient's care partner is independent to provide the necessary supervision/set-up assistance  at discharge.  Reasons goals not met: n/a - all goals met at this time  Recommendation:  Patient will benefit from ongoing skilled PT services in home health setting to continue to advance safe functional mobility, address ongoing impairments in strength, balance, endurance, functional mobility, ROM, gait, and minimize fall risk.  Equipment: No equipment provided. Pt already has w/c and PFRW.  Reasons for discharge: treatment goals  met and discharge from hospital  Patient/family agrees with progress made and goals achieved: Yes  PT Discharge Precautions/Restrictions Precautions Precautions: Fall Restrictions Weight Bearing Restrictions: Yes LUE Weight Bearing: Weight bear through elbow only LLE Weight Bearing: Non weight bearing   Cognition Overall Cognitive Status: Within Functional Limits for tasks assessed Safety/Judgment: Appears intact Sensation Sensation Light Touch: Impaired Detail Light Touch Impaired Details: Impaired RLE;Impaired LLE Proprioception: Appears Intact Coordination Gross Motor Movements are Fluid and Coordinated: Yes Motor  Motor Motor: Within Functional Limits Motor - Discharge Observations: WB restrictions/bracing limiting patient's ability     Trunk/Postural Assessment  Cervical Assessment Cervical Assessment: Within Functional Limits Thoracic Assessment Thoracic Assessment: Within Functional Limits Lumbar Assessment Lumbar Assessment: Within Functional Limits  Balance Balance Balance Assessed: Yes Static Sitting Balance Static Sitting - Level of Assistance: 6: Modified independent (Device/Increase time) Dynamic Sitting Balance Dynamic Sitting - Level of Assistance: 6: Modified independent (Device/Increase time) Static Standing Balance Static Standing - Level of Assistance: 6: Modified independent (Device/Increase time);5: Stand by assistance Dynamic Standing Balance Dynamic Standing - Level of Assistance: 5: Stand by assistance Extremity Assessment   see OT d/c summary for UE details   RLE Strength RLE Overall Strength Comments: Hip flexion 4+/5, knee extension 4+/5, ankle 3+/5 LLE Assessment LLE Assessment: Exceptions to [facility] LLE Strength LLE Overall Strength Comments: hip flexion 4/5; knee flexion/extension 4-/5; ankle 3-/5   See Function Navigator for Current Functional Status.  Canary Brim Ivory Broad, PT, DPT  08/30/2015, 12:07 PM

## 2015-08-31 MED ORDER — HEPARIN SOD (PORK) LOCK FLUSH 100 UNIT/ML IV SOLN
250.0000 [IU] | INTRAVENOUS | Status: AC | PRN
  Administered 2015-08-31: 250 [IU]

## 2015-08-31 NOTE — Progress Notes (Signed)
Pt examined by MD, remaining questions about sutures answered. Verbalized understanding of medications for home. Belongings packed. Pain under control. Escorted by NT to car and home with sons.

## 2015-08-31 NOTE — Progress Notes (Addendum)
[place] [facility]IC[facility]ILITATION     PROGRESS NOTE    Subjective/Complaints: Asking about suture removal  ROS: Pt denies fever, rash/itching, headache, blurred or double vision, nausea, vomiting, abdominal pain, diarrhea, chest pain, shortness of breath, palpitations, dysuria, dizziness, neck or back pain, bleeding,    Objective: Vital Signs: Blood pressure 116/66, pulse 82, temperature 98.1 F (36.7 C), temperature source Oral, resp. rate 20, height  (1.905 m), weight 106.198 kg (234 lb 2 oz), SpO2 100 %. No results found. No results for input(s): WBC, HGB, HCT, PLT in the last 72 hours.  Recent Labs  08/29/15 1720  CREATININE 1.19   CBG (last 3)  No results for input(s): GLUCAP in the last 72 hours.  Wt Readings from Last 3 Encounters:  08/31/15 106.198 kg (234 lb 2 oz)  08/18/15 100.6 kg (221 lb 12.5 oz)  08/04/15 99.791 kg (220 lb)    Physical Exam:  Constitutional: He is oriented to person, place, and time. He appears well-developed and well-nourished.  HENT:  Head: Normocephalic and atraumatic.  Eyes: Conjunctivae and EOM are normal.  Neck: Normal range of motion. Neck supple. No thyromegaly present.  Cardiovascular: Normal rate and regular rhythm.  Respiratory: Effort normal and breath sounds normal. No respiratory distress.  GI: Soft. Bowel sounds are normal. He exhibits no distension.  Musculoskeletal: He exhibits edema and tenderness.  Neurological: He is alert and oriented to person, place, and time.  Motor: RUE 5/5 proximal to distal LUE 4/5 shoulder abduction, 4-/5 elbow flexion/extension, 4- to 4/5 hand grip RLE: hip flexion, knee extension 4+/5, ankle dorsi/plantar flexion 4+/5 LLE: hip flexion 4/5, ankle dorsi/plantar flexion 4+/5  Sensation diminished symetrically distal to knees in stocking glove distribution.   Skin: Skin is warm and dry. Old right first toe amp. Left leg incision,small amt central sanginous drainage,  surrounding edema, sup and inf incision CDI Left upper extremity with dressing in place. Wrist remains tender. Wearing splint  Left lower extremity surgical dressing in place . Incision clean and intact without drainage. Psychiatric: His speech is normal. Judgment normal. Cognition and memory are normal   Assessment/Plan: 1. Mobility/functional deficits secondary to left tibia fracture/osteomyelitis  D/c home today will ortho f/u next wk for suture removal-Dr Magnus Ivan Function:  Bathing Bathing position   Position: Wheelchair/chair at sink  Bathing parts Body parts bathed by patient: Right arm, Left arm, Chest, Abdomen, Front perineal area, Buttocks, Right upper leg, Left upper leg, Right lower leg, Left lower leg Body parts bathed by helper: Back  Bathing assist Assist Level: Supervision or verbal cues   Set up : To obtain items  Upper Body Dressing/Undressing Upper body dressing   What is the patient wearing?: Pull over shirt/dress     Pull over shirt/dress - Perfomed by patient: Thread/unthread right sleeve, Put head through opening, Thread/unthread left sleeve, Pull shirt over trunk Pull over shirt/dress - Perfomed by helper: Thread/unthread left sleeve, Pull shirt over trunk        Upper body assist Assist Level: No help, No cues   Set up : To obtain clothing/put away  Lower Body Dressing/Undressing Lower body dressing   What is the patient wearing?: AFO, [employer], Socks, Pants, Shoes Underwear - Performed by patient: Thread/unthread right underwear leg, Thread/unthread left underwear leg Underwear - Performed by helper: Pull underwear up/down Pants- Performed by patient: Thread/unthread right pants leg, Thread/unthread left pants leg, Pull pants up/down Pants- Performed by helper: Thread/unthread left pants leg, Pull pants up/down  Socks - Performed by patient:  (L n/a) Socks - Performed by helper: Don/doff left sock Shoes - Performed by patient: Don/doff right  shoe, Fasten right   AFO - Performed by patient: Don/doff left AFO AFO - Performed by helper: Don/doff left AFO TED Hose - Performed by patient: Don/doff right TED hose, Don/doff left TED hose TED Hose - Performed by helper: Don/doff right TED hose, Don/doff left TED hose  Lower body assist Assist for lower body dressing: Touching or steadying assistance (Pt > 75%)   Set up : To obtain clothing/put away  Toileting Toileting Toileting activity did not occur:  (did not occur during eval) Toileting steps completed by patient: Adjust clothing prior to toileting, Performs perineal hygiene, Adjust clothing after toileting Toileting steps completed by helper: Adjust clothing prior to toileting, Performs perineal hygiene Toileting Assistive Devices: Grab bar or rail  Toileting assist Assist level: Supervision or verbal cues   Transfers Chair/bed transfer   Chair/bed transfer method: Squat pivot Chair/bed transfer assist level: No Help, no cues, assistive device, takes more than a reasonable amount of time Chair/bed transfer assistive device: Armrests, Orthosis     Locomotion Ambulation     Max distance: 31 Assist level: Supervision or verbal cues   Wheelchair   Type: Manual Max wheelchair distance: 250 Assist Level: No help, No cues, assistive device, takes more than reasonable amount of time  Cognition Comprehension Comprehension assist level: Follows complex conversation/direction with extra time/assistive device  Expression Expression assist level: Expresses complex ideas: With extra time/assistive device  Social Interaction Social Interaction assist level: Interacts appropriately with others with medication or extra time (anti-anxiety, antidepressant).  Problem Solving Problem solving assist level: Solves complex problems: Recognizes & self-corrects  Memory Memory assist level: Complete Independence: No helper  Medical Problem List and Plan: 1. Left distal third comminuted tibia  shaft fracture secondary to fall. Status post ORIF 08/17/2015. Nonweightbearing 6 weeks  -finalize dc planning for tomorrow 2. DVT Prophylaxis/Anticoagulation: Eliquis for history of SVT. 3. Pain Management/chronic pain management: MS Contin 90 mg every 12 hours, Neurontin 100 mg 3 times a day, Cymbalta 60 mg daily, Robaxin and oxycodone  q4 prn----may have up to 5 x per day  -improved pain control 4. Mood: Effexor 75 mg daily. 5. Neuropsych: This patient is capable of making decisions on his own behalf. 6. Skin/Wound Care:    - follow up with ortho for removal.  7. Fluids/Electrolytes/Nutrition: encourage po. 8. Chronic left wrist osteomyelitis/MRSA. Status post recent I&D per Dr.Coley---will need vanc at discharge with plan to determine length of therapy at outpt appt 1 week post discharge  -wbc/hgb are stable to improved. 9. History of SVT. Cardiac rate controlled. Continue Tambocor 100 mg every 12 hours, Lopressor 75 mg twice a day 10. Chronic diastolic congestive heart failure. Weight 104.7kg today 11. Hyperlipidemia. Lipitor 12. 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

## 2015-09-02 NOTE — Progress Notes (Signed)
Social Work Discharge Note  The overall goal for the admission was met for:   Discharge location: Yes - home  Length of Stay: Yes - 10 days  Discharge activity level: Yes - supervision  Home/community participation: Yes  Services provided included: MD, RD, PT, OT, RN, Pharmacy and SW  Financial Services: Medicaid  Follow-up services arranged: Home Health: RN/OT/PT, DME: w/c was replaced while he was here due to wheel in bad condition and Patient/Family request agency HH: [place], DME: [facility]  Comments (or additional information):  Pt to go home to his home with his wife and sons to provide supervision and assistance.  [facility] to continue to provide IV antibiotics and therapies.  Pt feels comfortable with d/c plan.    Patient/Family verbalized understanding of follow-up arrangements: Yes  Individual responsible for coordination of the follow-up plan: Pt with his wife and sons to assist  Confirmed correct DME delivered: Trey Sailors 09/02/2015    Kinsey Cowsert, Silvestre Mesi

## 2015-09-16 ENCOUNTER — Telehealth: Payer: Self-pay | Admitting: *Deleted

## 2015-09-16 NOTE — Telephone Encounter (Signed)
Verbal order relayed to [facility],  Corrie Dandy. Thanks!

## 2015-09-16 NOTE — Telephone Encounter (Signed)
Patient last seen in hospital 1/13 by Dr. Orvan Falconer, was supposed to follow up at [facility] in 1 week from discharge.  He was asked to come in ASAP after developing ringing in his ears shortly after starting vancomycin, patient did not make an appointment.  Patient is scheduled to complete IV Vanc on 2/23.  Per Amy, [facility] RN, [name] feels there is no need to continue IV vancomycin.  Patient is not scheduled to follow up at [facility] until 3/22 with Dr. Daiva Eves.   Please advise if ok to pull PICC. Andree Coss, RN

## 2015-09-16 NOTE — Telephone Encounter (Signed)
Yes

## 2015-09-20 ENCOUNTER — Ambulatory Visit (HOSPITAL_COMMUNITY)
Admission: RE | Admit: 2015-09-20 | Discharge: 2015-09-20 | Disposition: A | Discharge status: Home / Self Care | Payer: Medicaid Other | Source: Ambulatory Visit | Attending: Internal Medicine | Admitting: Internal Medicine

## 2015-09-20 ENCOUNTER — Other Ambulatory Visit: Payer: Self-pay | Admitting: Internal Medicine

## 2015-09-20 ENCOUNTER — Telehealth: Payer: Self-pay

## 2015-09-20 DIAGNOSIS — Z452 Encounter for adjustment and management of vascular access device: Secondary | ICD-10-CM

## 2015-09-20 DIAGNOSIS — Z792 Long term (current) use of antibiotics: Secondary | ICD-10-CM

## 2015-09-20 NOTE — Telephone Encounter (Addendum)
Patient was receiving IV antibiotics while in drug rehabilitation  for 3 months. Nurse went out today to remove and met resistance. Patient  Advised by Dr Megan Salon he wil need PICC out today by interventional radiology.  Patient advised to go to [facility] for PICC removal today.  I spoke with Radiology and was told he could come by radiology today for removal.   Laverle Patter, RN

## 2015-09-20 NOTE — Procedures (Signed)
PICC removal, no complications

## 2015-09-20 NOTE — Telephone Encounter (Deleted)
Note Correction : Patient was receiving IV antibiotics while in rehab.  Nurse went out yesterday to remove and met resistance. Advised by Dr Bridget Hartshorn to go to Interventional for removal today.   Laverle Patter, RN

## 2015-10-16 ENCOUNTER — Encounter: Payer: Self-pay | Admitting: Infectious Disease

## 2015-10-16 ENCOUNTER — Ambulatory Visit (INDEPENDENT_AMBULATORY_CARE_PROVIDER_SITE_OTHER): Payer: Medicaid Other | Admitting: Infectious Disease

## 2015-10-16 VITALS — BP 118/75 | HR 75 | Temp 97.7°F

## 2015-10-16 DIAGNOSIS — A419 Sepsis, unspecified organism: Secondary | ICD-10-CM | POA: Diagnosis not present

## 2015-10-16 DIAGNOSIS — I76 Septic arterial embolism: Secondary | ICD-10-CM

## 2015-10-16 DIAGNOSIS — R6520 Severe sepsis without septic shock: Secondary | ICD-10-CM

## 2015-10-16 DIAGNOSIS — S82202S Unspecified fracture of shaft of left tibia, sequela: Secondary | ICD-10-CM

## 2015-10-16 DIAGNOSIS — A4101 Sepsis due to Methicillin susceptible Staphylococcus aureus: Secondary | ICD-10-CM

## 2015-10-16 DIAGNOSIS — M86132 Other acute osteomyelitis, left radius and ulna: Secondary | ICD-10-CM

## 2015-10-16 DIAGNOSIS — M869 Osteomyelitis, unspecified: Secondary | ICD-10-CM

## 2015-10-16 DIAGNOSIS — R7881 Bacteremia: Secondary | ICD-10-CM

## 2015-10-16 DIAGNOSIS — M86671 Other chronic osteomyelitis, right ankle and foot: Secondary | ICD-10-CM

## 2015-10-16 DIAGNOSIS — R6521 Severe sepsis with septic shock: Secondary | ICD-10-CM

## 2015-10-16 DIAGNOSIS — I2690 Septic pulmonary embolism without acute cor pulmonale: Secondary | ICD-10-CM

## 2015-10-16 DIAGNOSIS — L02519 Cutaneous abscess of unspecified hand: Secondary | ICD-10-CM

## 2015-10-16 DIAGNOSIS — B958 Unspecified staphylococcus as the cause of diseases classified elsewhere: Secondary | ICD-10-CM

## 2015-10-16 DIAGNOSIS — I96 Gangrene, not elsewhere classified: Secondary | ICD-10-CM

## 2015-10-16 DIAGNOSIS — R29898 Other symptoms and signs involving the musculoskeletal system: Secondary | ICD-10-CM

## 2015-10-16 LAB — CBC WITH DIFFERENTIAL/PLATELET
BASOS ABS: 0.1 10*3/uL (ref 0.0–0.1)
Basophils Relative: 1 % (ref 0–1)
Eosinophils Absolute: 0.2 10*3/uL (ref 0.0–0.7)
Eosinophils Relative: 3 % (ref 0–5)
HCT: 42.5 % (ref 39.0–52.0)
HEMOGLOBIN: 14 g/dL (ref 13.0–17.0)
LYMPHS ABS: 2.3 10*3/uL (ref 0.7–4.0)
LYMPHS PCT: 35 % (ref 12–46)
MCH: 27.6 pg (ref 26.0–34.0)
MCHC: 32.9 g/dL (ref 30.0–36.0)
MCV: 83.8 fL (ref 78.0–100.0)
MONO ABS: 0.7 10*3/uL (ref 0.1–1.0)
MPV: 9.3 fL (ref 8.6–12.4)
Monocytes Relative: 10 % (ref 3–12)
NEUTROS ABS: 3.3 10*3/uL (ref 1.7–7.7)
Neutrophils Relative %: 51 % (ref 43–77)
Platelets: 294 10*3/uL (ref 150–400)
RBC: 5.07 MIL/uL (ref 4.22–5.81)
RDW: 16.8 % — ABNORMAL HIGH (ref 11.5–15.5)
WBC: 6.5 10*3/uL (ref 4.0–10.5)

## 2015-10-16 LAB — BASIC METABOLIC PANEL WITH GFR
BUN: 16 mg/dL (ref 7–25)
CHLORIDE: 101 mmol/L (ref 98–110)
CO2: 26 mmol/L (ref 20–31)
Calcium: 9.5 mg/dL (ref 8.6–10.3)
Creat: 0.81 mg/dL (ref 0.70–1.33)
GFR, Est African American: >89 mL/min (ref >=60)
GFR, Est Non African American: >89 mL/min (ref >=60)
Glucose, Bld: 98 mg/dL (ref 65–99)
POTASSIUM: 4.7 mmol/L (ref 3.5–5.3)
Sodium: 137 mmol/L (ref 135–146)

## 2015-10-16 LAB — C-REACTIVE PROTEIN: CRP: 0.8 mg/dL — ABNORMAL HIGH (ref <0.60)

## 2015-10-16 NOTE — Progress Notes (Signed)
Chief complaint: left hand swelling now again  Subjective:    Patient ID: [name], male    DOB: Nov 18, 1955, 60 y.o.   MRN: [id number]  HPI   [name] is an 60 y.o. 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 and later by plastics later to cover wound.    Patient also developed a left hand abscess and MRI done on 04/07/15 showed:  1. Dorsal distal radius transverse fracture, partially visible. 2. Diffuse edema of the hand, likely reactive to distal radius fracture. 3. Tear of the dorsal band of the scapholunate ligament. 4. Multiple small deep fluid collections in the hands are most compatible with hematoma given the heterogeneous T2 signal. In this patient with an elevated white blood cell count, superimposed infection is possible but this would be rare and in the setting of wrist trauma, ordinary hematoma is most likely.   He had I and D of hand abscess by Dr. Lenon Curt on 04/23/2015. Patient had remained on IV cefazolin since 04/10/15 and active abx since 04/06/15.  Despite this he had  been having increasing pain in his wrist, particular with increase in physical activity. Rehab MDs were concerned and ordered MRI on 05/11/15 which showed :  edema in the distal radius, ulna,all of the carpal bones and bases of the metacarpals.  Extensive soft tissue swelling and marked synovial thickening are seen throughout the carpus. 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  Dr. Lenon Curt was reconsulted and saw the pt on October 15th and he felt that the MRI was of poor quality but there is no evidence of necrosis. He did not feel the patient needed surgery at this point  in time rather needed wrist immobilization with a splint.   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 Sharol Given. He had developed new ulcer on the heel on the right side.   I had discussions with Dr Lenon Curt who as mentioned did not feel at hat time that  there was compelling evidence for osteomyelitis in wrist in need of surgery and we planned further protracted IV abx followed by repeat MRI 2-4 weeks from the one in mid October.   Unfortunately this was not done and patient was DC from rehab on the 29th of October. He saw me on November 5th and had in the meantime remained on high dose ancef.   His left wrist pain persisted and was WORSE with worsening redness and swelling. His ESR on blood work done by [facility] has risen further to 120.   As mentioned I saw the patient and ordered an MRI as an outpatient and we endeavored to get pt seen by Dr. Lenon Curt vs refer to a different hand surgeon.   Unfortunately since then the MRI has still not been done though it is scheduled for ten days from now. Patient was referred to Dr Amedeo Plenty but was refused due to being Dr. Brennan Bailey patient. Patient himself tells me this and that he was eventually seen by Dr. Lenon Curt in his clinic and that  repeat plain films were taken that per the patient showed dramatic differences between left and right wrists and Dr Lenon Curt saw there was dramatic changes on plain films.   In the interim pt has continued for yet another month on high dose ancef and despite this his wrist is in more pain.  In the last week his fingers have begun to go numb--something he experienced in 7 days prior to my seeing him.  I had him admitted and had urgent MRI of the hand performed on 07/02/15 which showed:  Marland Kitchen Severely limited examination secondary to significant patient motion throughout  the exam degrading image quality. 2. Severe edema involving the distal radius, distal ulna, throughout the carpal bones and bases of the metacarpals with severe synovial enhancement and extensive joint space narrowing most concerning for septic arthritis and osteomyelitis versus alternatively aggressive inflammatory or crystalline arthropathy. 1.9 x 0.8 x 1.2 cm peripherally enhancing fluid collection which appears to emanate from the dorsal distal radioulnar joint. 3. Tenosynovitis of the extensor carpi radialis longus and brevis which may be inflammatory versus infectious.  He was ultimately taken to the OR by Dr Lenon Curt on 12/ /2017 and he performed:  Incision and drainage, debridement, and deep culture of the left wrist.  The patient was continued on IV ancef and ultimately dc from the hospital to followup with me.  Cultures from that surgery ultimately grew a bacillus species. I believe this species was likely considered a contaminant. Even when a pathogen they are typically S to beta lactams if it was an actual bacillus species with exception of bacillus cereus.   Regardless the patient was then readmitted in January to [facility] with worsening hand pain.  Repeat MRI of the wrist showed on 08/05/15 :  IMPRESSION: 1. Diffuse marrow edema throughout the carpus, distal radius, distal ulna and at the bases of the metacarpals with diffuse synovitis most consistent with septic arthritis. Cortical erosion of the ulnar aspect of the distal radius and of the distal ulna at the distal radioulnar joint with a enhancing distal radioulnar joint effusion measuring 7.9 x 15.3 mm most concerning for septic arthritis. The overall changes are similar in appearance with MRI dated 07/05/2015. 2. There is cortical thickening, periosteal reaction and surrounding soft tissue edema involving the distal ulnar diaphysis which has progressed compared with the prior examination.  Dr. Megan Salon was re consulted  by [facility] on January 9th and he had noted the Bacillus species on culture from December and added vancomycin to cover this. He also noted that the patient had 1/2 of his Staph Aureus blood cultures in September also with a bacillus species.  The patient was taken back to the OR by Dr. Lenon Curt on 08/08/15:  He performed  1. Open arthrotomy of the left dorsal wrist joint with drainage  and sharp debridement of synovium, bone of the left wrist. 2. Partial excision of the scaphoid. 3. Partial excision of the lunate. 4. Partial excision of the triquetrum, debridement of the TFCC and  debridement of the distal radius, distal ulna, and DRUJ joint. 5. Placement of Stimulan calcium sulfate biodegradable antibiotic  irrigated beads.vancomycin, tobramycin 6. Complex wound closure.  Operative cultures failed to grow any organism. The patient was sent home on IV vancomycin x 6 weeks postoperatively and his PICC line and IV abx were DC in late February.  The patient tells me that initially he had improvement in his swelling in his left wrist but that now over the past 2 weeks he  has experienced worsening swelling vs before and cannot close his fist as fully as he could before. His left wrist is slightly warmer than the other on exam.  In the interim he fell and fractured his tibia and underwent ORIF by Dr. Magnus Ivan.   Past Medical History  Diagnosis Date  . Hypertension   . Chronic knee pain     "both sides"  . Hyperlipemia   . PUD (peptic ulcer disease)   . Peripheral vascular disease (HCC)   . Anxiety   . GERD (gastroesophageal reflux disease)   . 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 Atrial fibrillation (HCC) dx'd 04/2015  . Chronic bronchitis (HCC)     "used to get it q yr; nothing in the last ~ 5 yrs" (08/15/2015)  . Migraine     "q 3 months" (08/15/2015)  . Osteoarthritis of both knees   . Depression     Past Surgical History    Procedure Laterality Date  . I&d extremity Left 07/12/2013    Procedure: I&D Left Thigh Abscess;  Surgeon: [name], MD;  Location: [facility] OR;  Service: Orthopedics;  Laterality: Left;  . Replacement total knee bilateral Bilateral ~ 2007-2013"    "left-right"  . Shoulder arthroscopy w/ rotator cuff repair Bilateral 1992-1993    "right-left"  . Elbow arthroscopy with tendon reconstruction Right 1990  . Femoral artery exploration Right 04/11/2015    Procedure: Resection of infected right femoral artery aneurysm;  Surgeon: Fransisco Hertz, MD;  Location: [facility] OR;  Service: Vascular;  Laterality: Right;  . Incision and drainage abscess Right 04/11/2015    Procedure: INCISION AND DRAINAGE OF RIGHT THIGH ABSCESS;  Surgeon: Fransisco Hertz, MD;  Location: [facility] OR;  Service: Vascular;  Laterality: Right;  . Incision and drainage abscess Right 04/13/2015    Procedure: INCISION AND DRAINAGE THIGH ABSCESS;  Surgeon: Fransisco Hertz, MD;  Location: [facility] OR;  Service: Vascular;  Laterality: Right;  . I&d extremity Left 04/17/2015    Procedure: IRRIGATION AND DEBRIDEMENT LEFT EXTREMITY;  Surgeon: Fransisco Hertz, MD;  Location: [facility] OR;  Service: Vascular;  Laterality: Left;  Marland Kitchen Muscle flap closure Right 04/19/2015    Procedure: RIGHT RECTUS ABDOMINUS  FLAP TO RIGHT GROIN;  Surgeon: Glenna Fellows, MD;  Location: [facility] OR;  Service: Plastics;  Laterality: Right;  . Femoral revision Right 04/19/2015    Procedure: OVERSEW RIGHT FEMORAL VEIN ;  Surgeon: Chuck Hint, MD;  Location: [facility] OR;  Service: Vascular;  Laterality: Right;  . Wound exploration Right 04/23/2015    Procedure: EXAM UNDER ANESTHESIA AND VAC CHANGE RIGHT THIGH;  Surgeon: Glenna Fellows, MD;  Location: [facility] OR;  Service: Plastics;  Laterality: Right;  . Amputation Right 05/17/2015    Procedure: Right 1st and 2nd Toe Amputation;  Surgeon: Nadara Mustard, MD;  Location: [facility] OR;  Service: Orthopedics;  Laterality: Right;  . I&d extremity Left 07/09/2015     Procedure: IRRIGATION AND DEBRIDEMENT LEFT WRIST;  Surgeon: Knute Neu, MD;  Location: [facility] OR;  Service: Plastics;  Laterality: Left;  . I&d extremity Left 08/08/2015    Procedure: IRRIGATION AND DEBRIDEMENT EXTREMITY;  Surgeon: Knute Neu, MD;  Location: [facility] OR;  Service: Plastics;  Laterality: Left;  . Tonsillectomy  ~ 1972  . Appendectomy  ~ 1980  . Cholecystectomy  ~ 1982  . Inguinal hernia repair Right ~ 1969  . Hemorrhoidectomy with hemorrhoid banding  ~ 1985  . Knee arthroscopy  Bilateral 1985-2008    "left-right"  . Joint replacement    . Uvulopalatoplasty  1991  . Cardiac catheterization  1993    "Dr. Martinique; for vasospasms"  . Orif tibia fracture Left 08/17/2015    Procedure: OPEN REDUCTION INTERNAL FIXATION (ORIF) TIBIA FRACTURE;  Surgeon: Mcarthur Rossetti, MD;  Location: [facility];  Service: Orthopedics;  Laterality: Left;    Family History  Problem Relation Age of Onset  . Heart attack Paternal Grandmother 76  . Heart disease Mother 25    arrythmia  . Stroke Paternal Grandfather   . Stroke Maternal Grandfather       Social History   Social History  . Marital Status: Married    Spouse Name: N/A  . Number of Children: 3  . Years of Education: N/A   Occupational History  .      Sales   Social History Main Topics  . Smoking status: Former Smoker -- 1.00 packs/day for 3 years    Types: Cigarettes  . Smokeless tobacco: Never Used     Comment: "quit smoking cigarettes in 1979  . Alcohol Use: No  . Drug Use: No  . Sexual Activity: Yes   Other Topics Concern  . None   Social History Narrative    Allergies  Allergen Reactions  . Verapamil Other (See Comments)    Causes pvc's  . Imitrex [Sumatriptan] Other (See Comments)    Chest pain, 2004 (tolerates 50 mg prn)     Current outpatient prescriptions:  .  albuterol (PROAIR HFA) 108 (90 BASE) MCG/ACT inhaler, Inhale 1 puff into the lungs every 6 (six) hours as needed for wheezing or shortness of  breath. , Disp: , Rfl:  .  apixaban (ELIQUIS) 5 MG TABS tablet, Take 1 tablet (5 mg total) by mouth 2 (two) times daily., Disp: 60 tablet, Rfl: 0 .  atorvastatin (LIPITOR) 80 MG tablet, Take 1 tablet (80 mg total) by mouth daily., Disp: 30 tablet, Rfl: 1 .  docusate sodium (COLACE) 100 MG capsule, Take 1 capsule (100 mg total) by mouth 2 (two) times daily., Disp: 10 capsule, Rfl: 0 .  DULoxetine (CYMBALTA) 30 MG capsule, Take 2 capsules (60 mg total) by mouth daily at 12 noon., Disp: 30 capsule, Rfl: 0 .  flecainide (TAMBOCOR) 100 MG tablet, Take 1 tablet (100 mg total) by mouth every 12 (twelve) hours., Disp: 60 tablet, Rfl: 1 .  gabapentin (NEURONTIN) 100 MG capsule, Take 1 capsule (100 mg total) by mouth 3 (three) times daily., Disp: 90 capsule, Rfl: 1 .  methocarbamol (ROBAXIN) 500 MG tablet, Take 1 tablet (500 mg total) by mouth every 6 (six) hours as needed for muscle spasms., Disp: 90 tablet, Rfl: 0 .  Metoprolol Tartrate 75 MG TABS, Take 75 mg by mouth 2 (two) times daily., Disp: 60 tablet, Rfl: 1 .  morphine (MS CONTIN) 30 MG 12 hr tablet, Take 3 tablets (90 mg total) by mouth every 12 (twelve) hours., Disp: 90 tablet, Rfl: 0 .  Multiple Vitamin (MULTIVITAMIN WITH MINERALS) TABS tablet, Take 1 tablet by mouth daily., Disp: , Rfl:  .  naloxegol oxalate (MOVANTIK) 25 MG TABS tablet, Take 1 tablet (25 mg total) by mouth daily before breakfast., Disp: 30 tablet, Rfl: 0 .  oxyCODONE (ROXICODONE) 30 MG immediate release tablet, Take 1 tablet (30 mg total) by mouth every 4 (four) hours as needed for moderate pain., Disp: 90 tablet, Rfl: 0 .  polyethylene glycol (MIRALAX / GLYCOLAX) packet, Take 17 g  by mouth daily., Disp: 100 each, Rfl: 0 .  SUMAtriptan (IMITREX) 50 MG tablet, Take 50 mg by mouth daily as needed for migraine or headache. Maximum 2 doses in 2 days, Disp: , Rfl: 0 .  testosterone cypionate (DEPOTESTOSTERONE CYPIONATE) 200 MG/ML injection, Inject 100 mg into the muscle every 14  (fourteen) days. Reported on 08/05/2015, Disp: , Rfl: 0 .  trifluridine (VIROPTIC) 1 % ophthalmic solution, Place 1 drop into both eyes 2 (two) times daily as needed (breakouts (blurry, matted eyelids))., Disp: , Rfl:  .  venlafaxine XR (EFFEXOR-XR) 75 MG 24 hr capsule, Take 1 capsule (75 mg total) by mouth daily with breakfast., Disp: 30 capsule, Rfl: 1 .  [DISCONTINUED] diphenhydrAMINE (BENADRYL) 25 MG tablet, Take 1 tablet (25 mg total) by mouth every 6 (six) hours. (Patient not taking: Reported on 02/25/2015), Disp: 20 tablet, Rfl: 0 .  [DISCONTINUED] pantoprazole (PROTONIX) 40 MG tablet, Take 1 tablet (40 mg total) by mouth daily. (Patient taking differently: Take 40 mg by mouth daily as needed (heartburn). ), Disp: 30 tablet, Rfl: 0   Review of Systems  Constitutional: Negative for fever, chills, diaphoresis, activity change, appetite change, fatigue and unexpected weight change.  HENT: Negative for congestion, rhinorrhea, sinus pressure, sneezing, sore throat and trouble swallowing.   Eyes: Negative for photophobia and visual disturbance.  Respiratory: Negative for cough, chest tightness, shortness of breath, wheezing and stridor.   Cardiovascular: Negative for chest pain, palpitations and leg swelling.  Gastrointestinal: Negative for nausea, vomiting, abdominal pain, diarrhea, constipation, blood in stool, abdominal distention and anal bleeding.  Genitourinary: Negative for dysuria, hematuria, flank pain and difficulty urinating.  Musculoskeletal: Positive for myalgias, joint swelling and arthralgias. Negative for back pain and gait problem.  Skin: Positive for wound. Negative for color change, pallor and rash.  Neurological: Positive for numbness. Negative for dizziness, tremors, weakness and light-headedness.  Hematological: Negative for adenopathy. Does not bruise/bleed easily.  Psychiatric/Behavioral: Negative for behavioral problems, confusion, sleep disturbance, dysphoric mood, decreased  concentration and agitation.       Objective:   Physical Exam  Constitutional: He is oriented to person, place, and time. He appears well-developed and well-nourished.  HENT:  Head: Normocephalic and atraumatic.  Eyes: Conjunctivae and EOM are normal.  Neck: Normal range of motion. Neck supple.  Cardiovascular: Normal rate, regular rhythm and normal heart sounds.  Exam reveals no gallop and no friction rub.   No murmur heard. Pulmonary/Chest: He has no wheezes.  Abdominal: Soft. He exhibits no distension.  Neurological: He is alert and oriented to person, place, and time.  Skin: Skin is warm and dry.  Psychiatric: His behavior is normal. Judgment and thought content normal.   Left wrist October 14th, 2016       Left wrist November 9th, 2016:    Left wrist 07/04/15: warm tender increased numbness in first 3 fingers    Left wrist 10/15/15:      Right toes October 2016 preop        Right foot November 9th postop:        Right foot 07/04/15:     10/15/15:      Right ankle wound 06/05/15:     Left prosthetic knee site 06/05/15: clean     [facility]VS operative site 06/05/15:    Other surgical sites were clean today      Assessment & Plan:   MSSA bacteremia infected mycotic aneurysm and thigh abscess sp  surgery by vascular surgery then plastic surgeries also with  fractured wrist and abscess in hand status post I&D of hand abscess, thrombotic lesions to toes where he has chronic wounds and areas that  with MRI showing evidence of septic arthritis and osteomyelitis in his left wrist in October but no surgery performed (see above) with amputations of 2 toes by Dr. Sharol Given protracted IV abx but despite this worsening and persistence of wrist pain,worsening through today with fingers going numb in December sp hand surgery with Bacillus isolated on culture, continued on IV ancef then readmitted in January with vancomycin added due to concerns re the bacillus  actually possibly having been a pathogen and one that might have been R to beta lactams. IV vancomycin added and he underwent extensive hand surgery in January and now has completed 6 weeks of postop IV vancomycin  Hand is swelling more now  I will check inflammatory markers and if up will repeat MRI of the left wrist with contrast, otherwise will have him followup with me in the next 2 months  Fracture sp ORIF: to followup with Dr. Ninfa Linden  .I spent greater than 25 minutes with the patient including greater than 50% of time in face to face counsel of the patient re his MSSA bacteremia, mycotic aneurysm, amputated toes from emboli, septic wrist with osteomyeltis sp further surgeries and in coordination of their care.

## 2015-10-17 ENCOUNTER — Encounter: Payer: Self-pay | Admitting: Vascular Surgery

## 2015-10-17 LAB — SEDIMENTATION RATE: SED RATE: 7 mm/h (ref 0–20)

## 2015-10-18 ENCOUNTER — Encounter: Payer: Self-pay | Admitting: Infectious Disease

## 2015-10-21 ENCOUNTER — Inpatient Hospital Stay: Payer: Medicaid Other | Admitting: Infectious Disease

## 2015-10-25 ENCOUNTER — Ambulatory Visit: Payer: Medicaid Other | Admitting: Vascular Surgery

## 2015-10-25 ENCOUNTER — Encounter (HOSPITAL_COMMUNITY): Payer: Medicaid Other

## 2015-11-05 ENCOUNTER — Encounter: Payer: Self-pay | Admitting: Infectious Disease

## 2015-12-02 ENCOUNTER — Emergency Department (HOSPITAL_BASED_OUTPATIENT_CLINIC_OR_DEPARTMENT_OTHER)
Admission: EM | Admit: 2015-12-02 | Discharge: 2015-12-02 | Disposition: A | Discharge status: Home / Self Care | Payer: Medicaid Other | Attending: Emergency Medicine | Admitting: Emergency Medicine

## 2015-12-02 ENCOUNTER — Encounter (HOSPITAL_BASED_OUTPATIENT_CLINIC_OR_DEPARTMENT_OTHER): Payer: Self-pay | Admitting: *Deleted

## 2015-12-02 DIAGNOSIS — I10 Essential (primary) hypertension: Secondary | ICD-10-CM | POA: Insufficient documentation

## 2015-12-02 DIAGNOSIS — M25569 Pain in unspecified knee: Secondary | ICD-10-CM

## 2015-12-02 DIAGNOSIS — M25561 Pain in right knee: Secondary | ICD-10-CM | POA: Insufficient documentation

## 2015-12-02 DIAGNOSIS — Z79899 Other long term (current) drug therapy: Secondary | ICD-10-CM | POA: Insufficient documentation

## 2015-12-02 DIAGNOSIS — J45909 Unspecified asthma, uncomplicated: Secondary | ICD-10-CM | POA: Insufficient documentation

## 2015-12-02 DIAGNOSIS — G8929 Other chronic pain: Secondary | ICD-10-CM | POA: Diagnosis not present

## 2015-12-02 DIAGNOSIS — I4891 Unspecified atrial fibrillation: Secondary | ICD-10-CM | POA: Insufficient documentation

## 2015-12-02 DIAGNOSIS — E785 Hyperlipidemia, unspecified: Secondary | ICD-10-CM | POA: Diagnosis not present

## 2015-12-02 DIAGNOSIS — M170 Bilateral primary osteoarthritis of knee: Secondary | ICD-10-CM | POA: Insufficient documentation

## 2015-12-02 DIAGNOSIS — F329 Major depressive disorder, single episode, unspecified: Secondary | ICD-10-CM | POA: Insufficient documentation

## 2015-12-02 DIAGNOSIS — Z87891 Personal history of nicotine dependence: Secondary | ICD-10-CM | POA: Insufficient documentation

## 2015-12-02 DIAGNOSIS — R002 Palpitations: Secondary | ICD-10-CM

## 2015-12-02 LAB — CBC WITH DIFFERENTIAL/PLATELET
BASOS ABS: 0.1 10*3/uL (ref 0.0–0.1)
Basophils Relative: 0 %
EOS ABS: 0.2 10*3/uL (ref 0.0–0.7)
Eosinophils Relative: 1 %
HCT: 46.8 % (ref 39.0–52.0)
HEMOGLOBIN: 15.6 g/dL (ref 13.0–17.0)
LYMPHS ABS: 3.6 10*3/uL (ref 0.7–4.0)
LYMPHS PCT: 30 %
MCH: 28.9 pg (ref 26.0–34.0)
MCHC: 33.3 g/dL (ref 30.0–36.0)
MCV: 86.8 fL (ref 78.0–100.0)
Monocytes Absolute: 1 10*3/uL (ref 0.1–1.0)
Monocytes Relative: 9 %
NEUTROS PCT: 60 %
Neutro Abs: 7.1 10*3/uL (ref 1.7–7.7)
Platelets: 405 10*3/uL — ABNORMAL HIGH (ref 150–400)
RBC: 5.39 MIL/uL (ref 4.22–5.81)
RDW: 16.2 % — ABNORMAL HIGH (ref 11.5–15.5)
WBC: 11.9 10*3/uL — AB (ref 4.0–10.5)

## 2015-12-02 LAB — BASIC METABOLIC PANEL
Anion gap: 5 (ref 5–15)
BUN: 16 mg/dL (ref 6–20)
CHLORIDE: 104 mmol/L (ref 101–111)
CO2: 26 mmol/L (ref 22–32)
CREATININE: 0.93 mg/dL (ref 0.61–1.24)
Calcium: 9.3 mg/dL (ref 8.9–10.3)
GFR calc non Af Amer: >60 mL/min (ref >60)
Glucose, Bld: 103 mg/dL — ABNORMAL HIGH (ref 65–99)
POTASSIUM: 3.8 mmol/L (ref 3.5–5.1)
SODIUM: 135 mmol/L (ref 135–145)

## 2015-12-02 LAB — TROPONIN I

## 2015-12-02 MED ORDER — OXYCODONE-ACETAMINOPHEN 5-325 MG PO TABS: 1.0000 | ORAL_TABLET | Freq: Four times a day (QID) | ORAL | Status: DC | PRN

## 2015-12-02 MED ORDER — OXYCODONE-ACETAMINOPHEN 5-325 MG PO TABS
2.0000 | ORAL_TABLET | Freq: Once | ORAL | Status: AC
  Administered 2015-12-02: 2 via ORAL
  Filled 2015-12-02: qty 2

## 2015-12-02 NOTE — Discharge Instructions (Signed)
Percocet as prescribed as needed for pain.  Please follow-up with your pain management physician.   Chronic Pain Chronic pain can be defined as pain that is off and on and lasts for 3-6 months or longer. Many things cause chronic pain, which can make it difficult to make a diagnosis. There are many treatment options available for chronic pain. However, finding a treatment that works well for you may require trying various approaches until the right one is found. Many people benefit from a combination of two or more types of treatment to control their pain. SYMPTOMS  Chronic pain can occur anywhere in the body and can range from mild to very severe. Some types of chronic pain include:  Headache.  Low back pain.  Cancer pain.  Arthritis pain.  Neurogenic pain. This is pain resulting from damage to nerves. People with chronic pain may also have other symptoms such as:  Depression.  Anger.  Insomnia.  Anxiety. DIAGNOSIS  Your health care provider will help diagnose your condition over time. In many cases, the initial focus will be on excluding possible conditions that could be causing the pain. Depending on your symptoms, your health care provider may order tests to diagnose your condition. Some of these tests may include:   Blood tests.   CT scan.   MRI.   X-rays.   Ultrasounds.   Nerve conduction studies.  You may need to see a specialist.  TREATMENT  Finding treatment that works well may take time. You may be referred to a pain specialist. He or she may prescribe medicine or therapies, such as:   Mindful meditation or yoga.  Shots (injections) of numbing or pain-relieving medicines into the spine or area of pain.  Local electrical stimulation.  Acupuncture.   Massage therapy.   Aroma, color, light, or sound therapy.   Biofeedback.   Working with a physical therapist to keep from getting stiff.   Regular, gentle exercise.   Cognitive or  behavioral therapy.   Group support.  Sometimes, surgery may be recommended.  HOME CARE INSTRUCTIONS   Take all medicines as directed by your health care provider.   Lessen stress in your life by relaxing and doing things such as listening to calming music.   Exercise or be active as directed by your health care provider.   Eat a healthy diet and include things such as vegetables, fruits, fish, and lean meats in your diet.   Keep all follow-up appointments with your health care provider.   Attend a support group with others suffering from chronic pain. SEEK MEDICAL CARE IF:   Your pain gets worse.   You develop a new pain that was not there before.   You cannot tolerate medicines given to you by your health care provider.   You have new symptoms since your last visit with your health care provider.  SEEK IMMEDIATE MEDICAL CARE IF:   You feel weak.   You have decreased sensation or numbness.   You lose control of bowel or bladder function.   Your pain suddenly gets much worse.   You develop shaking.  You develop chills.  You develop confusion.  You develop chest pain.  You develop shortness of breath.  MAKE SURE YOU:  Understand these instructions.  Will watch your condition.  Will get help right away if you are not doing well or get worse.   This information is not intended to replace advice given to you by your health care provider. Make  sure you discuss any questions you have with your health care provider.   Document Released: 04/04/2002 Document Revised: 03/15/2013 Document Reviewed: 01/06/2013 Elsevier Interactive Patient Education [employer].

## 2015-12-02 NOTE — ED Notes (Signed)
MD at bedside to update pt in plan of care. 

## 2015-12-02 NOTE — ED Notes (Signed)
States he ran out of pain medication yesterday. Pain in both his knees.

## 2015-12-02 NOTE — ED Provider Notes (Signed)
CSN: [id number]     Arrival date & time 12/02/15  2028 History  By signing my name below, I, Linus Galas, attest that this documentation has been prepared under the direction and in the presence of Geoffery Lyons, MD. Electronically Signed: Linus Galas, ED Scribe. 12/02/2015. 8:59 PM.  Chief Complaint  Patient presents with  . Knee Pain   The history is provided by the patient. No language interpreter was used.   HPI Comments: [name] is a 60 y.o. male who presents to the Emergency Department with a PMHx of HTN and regularly irregular heart beats without atrial fibrillation complaining of myalgias all day today. Pt states he has felt "terrible" all day today. When he checked his heart rate and blood pressure they were both elevated. Pt took an extra metoprolol with relief. He continued to feel "terible and decided to come to the ED for evaluation. Pt denies any other complaints at this time.   Pt states he takes pain medication for a recent left tibia fracture and chronic knee pain. He had an open reduction performed for his fracture and was prescribed pain medication. He states he has been taking more pain medication that he should be and does not have any more.   Past Medical History  Diagnosis Date  . Hypertension   . Chronic knee pain     "both sides"  . Hyperlipemia   . PUD (peptic ulcer disease)   . Peripheral vascular disease (HCC)   . Anxiety   . GERD (gastroesophageal reflux disease)   . 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 Atrial fibrillation (HCC) dx'd 04/2015  . Chronic bronchitis (HCC)     "used to get it q yr; nothing in the last ~ 5 yrs" (08/15/2015)  . Migraine     "q 3 months" (08/15/2015)  . Osteoarthritis of both knees   . Depression    Past Surgical History  Procedure Laterality Date  . I&d extremity Left 07/12/2013    Procedure: I&D Left Thigh Abscess;  Surgeon: [name], MD;  Location: [facility] OR;   Service: Orthopedics;  Laterality: Left;  . Replacement total knee bilateral Bilateral ~ 2007-2013"    "left-right"  . Shoulder arthroscopy w/ rotator cuff repair Bilateral 1992-1993    "right-left"  . Elbow arthroscopy with tendon reconstruction Right 1990  . 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 Incision and drainage abscess Right 04/11/2015    Procedure: INCISION AND DRAINAGE OF RIGHT THIGH ABSCESS;  Surgeon: Fransisco Hertz, MD;  Location: [facility] OR;  Service: Vascular;  Laterality: Right;  . Incision and drainage abscess Right 04/13/2015    Procedure: INCISION AND DRAINAGE THIGH ABSCESS;  Surgeon: Fransisco Hertz, MD;  Location: [facility] OR;  Service: Vascular;  Laterality: Right;  . I&d extremity Left 04/17/2015    Procedure: IRRIGATION AND DEBRIDEMENT LEFT EXTREMITY;  Surgeon: Fransisco Hertz, MD;  Location: [facility] OR;  Service: Vascular;  Laterality: Left;  Marland Kitchen Muscle flap closure Right 04/19/2015    Procedure: RIGHT RECTUS ABDOMINUS  FLAP TO RIGHT GROIN;  Surgeon: Glenna Fellows, MD;  Location: [facility] OR;  Service: Plastics;  Laterality: Right;  . Femoral revision Right 04/19/2015    Procedure: OVERSEW RIGHT FEMORAL VEIN ;  Surgeon: Chuck Hint, MD;  Location: [facility] OR;  Service: Vascular;  Laterality: Right;  . Wound exploration Right 04/23/2015    Procedure: EXAM UNDER ANESTHESIA AND VAC CHANGE RIGHT THIGH;  Surgeon: Glenna Fellows, MD;  Location: [facility] OR;  Service: Plastics;  Laterality: Right;  . Amputation Right 05/17/2015    Procedure: Right 1st and 2nd Toe Amputation;  Surgeon: Nadara Mustard, MD;  Location: [facility] OR;  Service: Orthopedics;  Laterality: Right;  . I&d extremity Left 07/09/2015    Procedure: IRRIGATION AND DEBRIDEMENT LEFT WRIST;  Surgeon: Knute Neu, MD;  Location: [facility] OR;  Service: Plastics;  Laterality: Left;  . I&d extremity  Left 08/08/2015    Procedure: IRRIGATION AND DEBRIDEMENT EXTREMITY;  Surgeon: Knute Neu, MD;  Location: [facility] OR;  Service: Plastics;  Laterality: Left;  . Tonsillectomy  ~ 1972  . Appendectomy  ~ 1980  . Cholecystectomy  ~ 1982  . Inguinal hernia repair Right ~ 1969  . Hemorrhoidectomy with hemorrhoid banding  ~ 1985  . Knee arthroscopy Bilateral 1985-2008    "left-right"  . Joint replacement    . Uvulopalatoplasty  1991  . Cardiac catheterization  1993    "Dr. [place]; for vasospasms"  . Orif tibia fracture Left 08/17/2015    Procedure: OPEN REDUCTION INTERNAL FIXATION (ORIF) TIBIA FRACTURE;  Surgeon: Kathryne Hitch, MD;  Location: [facility] OR;  Service: Orthopedics;  Laterality: Left;   Family History  Problem Relation Age of Onset  . Heart attack Paternal Grandmother 58  . Heart disease Mother 93    arrythmia  . Stroke Paternal Grandfather   . Stroke Maternal Grandfather    Social History  Substance Use Topics  . Smoking status: Former Smoker -- 1.00 packs/day for 3 years    Types: Cigarettes  . Smokeless tobacco: Never Used     Comment: "quit smoking cigarettes in 1979  . Alcohol Use: No    Review of Systems A complete 10 system review of systems was obtained and all systems are negative except as noted in the HPI and PMH.   Allergies  Verapamil and Imitrex  Home Medications   Prior to Admission medications   Medication Sig Start Date End Date Taking? Authorizing Provider  albuterol (PROAIR HFA) 108 (90 BASE) MCG/ACT inhaler Inhale 1 puff into the lungs every 6 (six) hours as needed for wheezing or shortness of breath.  09/06/13   Historical Provider, MD  apixaban (ELIQUIS) 5 MG TABS tablet Take 1 tablet (5 mg total) by mouth 2 (two) times daily. 08/30/15   Mcarthur Rossetti Angiulli, PA-C  atorvastatin (LIPITOR) 80 MG tablet Take 1 tablet (80 mg total) by mouth daily. 08/30/15   Mcarthur Rossetti Angiulli, PA-C  docusate sodium (COLACE) 100 MG capsule Take 1 capsule (100 mg total) by  mouth 2 (two) times daily. 05/24/15   Mcarthur Rossetti Angiulli, PA-C  DULoxetine (CYMBALTA) 30 MG capsule Take 2 capsules (60 mg total) by mouth daily at 12 noon. 08/30/15   Mcarthur Rossetti Angiulli, PA-C  flecainide (TAMBOCOR) 100 MG tablet Take 1 tablet (100 mg total) by mouth every 12 (twelve) hours. 08/30/15   Mcarthur Rossetti Angiulli, PA-C  gabapentin (NEURONTIN) 100 MG capsule Take 1 capsule (100 mg total) by mouth 3 (three) times daily. 08/30/15   Mcarthur Rossetti Angiulli, PA-C  methocarbamol (ROBAXIN) 500 MG tablet Take 1 tablet (500 mg total) by mouth every 6 (six) hours as needed for muscle spasms. 08/30/15   Mcarthur Rossetti Angiulli, PA-C  Metoprolol Tartrate 75 MG TABS Take 75 mg by mouth 2 (two) times  daily. 08/30/15   Mcarthur Rossettianiel J Angiulli, PA-C  morphine (MS CONTIN) 30 MG 12 hr tablet Take 3 tablets (90 mg total) by mouth every 12 (twelve) hours. 08/30/15   Mcarthur Rossettianiel J Angiulli, PA-C  Multiple Vitamin (MULTIVITAMIN WITH MINERALS) TABS tablet Take 1 tablet by mouth daily. 05/24/15   Mcarthur Rossettianiel J Angiulli, PA-C  naloxegol oxalate (MOVANTIK) 25 MG TABS tablet Take 1 tablet (25 mg total) by mouth daily before breakfast. 05/24/15   Mcarthur Rossettianiel J Angiulli, PA-C  oxyCODONE (ROXICODONE) 30 MG immediate release tablet Take 1 tablet (30 mg total) by mouth every 4 (four) hours as needed for moderate pain. 08/30/15   Mcarthur Rossettianiel J Angiulli, PA-C  polyethylene glycol (MIRALAX / GLYCOLAX) packet Take 17 g by mouth daily. 08/09/15   Shanker Levora DredgeM Ghimire, MD  SUMAtriptan (IMITREX) 50 MG tablet Take 50 mg by mouth daily as needed for migraine or headache. Maximum 2 doses in 2 days 02/15/15   Historical Provider, MD  testosterone cypionate (DEPOTESTOSTERONE CYPIONATE) 200 MG/ML injection Inject 100 mg into the muscle every 14 (fourteen) days. Reported on 08/05/2015 06/25/15   Historical Provider, MD  trifluridine (VIROPTIC) 1 % ophthalmic solution Place 1 drop into both eyes 2 (two) times daily as needed (breakouts (blurry, matted eyelids)).    Historical Provider, MD  venlafaxine  XR (EFFEXOR-XR) 75 MG 24 hr capsule Take 1 capsule (75 mg total) by mouth daily with breakfast. 08/30/15   Mcarthur Rossettianiel J Angiulli, PA-C   BP 150/98 mmHg  Pulse 92  Temp(Src) 98.4 F (36.9 C) (Oral)  Resp 18  Ht 6\' 4"  (1.93 m)  Wt 240 lb (108.863 kg)  BMI 29.23 kg/m2  SpO2 98%   Physical Exam  Constitutional: He is oriented to person, place, and time. He appears well-developed and well-nourished. No distress.  HENT:  Head: Normocephalic and atraumatic.  Mouth/Throat: Oropharynx is clear and moist. No oropharyngeal exudate.  Eyes: Conjunctivae and EOM are normal. Pupils are equal, round, and reactive to light.  Neck: Normal range of motion. Neck supple.  No meningismus.  Cardiovascular: Normal rate, regular rhythm, normal heart sounds and intact distal pulses.   No murmur heard. Pulmonary/Chest: Effort normal and breath sounds normal. No respiratory distress.  Abdominal: Soft. There is no tenderness. There is no rebound and no guarding.  Musculoskeletal: Normal range of motion. He exhibits no edema or tenderness.  Neurological: He is alert and oriented to person, place, and time. No cranial nerve deficit. He exhibits normal muscle tone. Coordination normal.  No ataxia on finger to nose bilaterally. No pronator drift. 5/5 strength throughout. CN 2-12 intact.Equal grip strength. Sensation intact.   Skin: Skin is warm.  Psychiatric: He has a normal mood and affect. His behavior is normal.  Nursing note and vitals reviewed.   ED Course  Procedures  DIAGNOSTIC STUDIES: Oxygen Saturation is 99% on room air, normal  by my interpretation.    COORDINATION OF CARE: 8:54 PM Will order EKG and blood work. Discussed treatment plan with pt at bedside and pt agreed to plan.  Labs Review Labs Reviewed - No data to display  Imaging Review No results found. I have personally reviewed and evaluated these images and lab results as part of my medical decision-making.   EKG Interpretation None       MDM   Final diagnoses:  None    Patient with history of chronic pain in his knees for which he is in pain management and receives heavy doses of narcotics. He presents today with  complaints of not feeling well and worsening pain. He reports being out of his pain medication. His workup is unremarkable. I was told his initial heart rate was over 200 in triage, however by the time he was placed in the exam room his heart rate was in the 80s and 90s. He tells me that he has these episodes intermittently and that that this is not new. His laboratory studies are unremarkable an EKG reveals a sinus rhythm. I feel as though he is appropriate for discharge. He is to follow-up with his pain management doctors. I have agreed to write a small quantity of percocet which he can take for his pain.  I personally performed the services described in this documentation, which was scribed in my presence. The recorded information has been reviewed and is accurate.       Geoffery Lyons, MD 12/02/15 [phone]

## 2015-12-02 NOTE — ED Notes (Signed)
At triage when PO showed a heart rate of 200. Pt felt pressure in his chest. Radial pulse was palpated and I was unable to count the rate. Rate slowed to 110 prior to taking pt to room 14. Pt states he has a hx of irregular heart beat without atrial fib.

## 2015-12-16 ENCOUNTER — Ambulatory Visit: Payer: Medicaid Other | Admitting: Infectious Disease

## 2015-12-26 ENCOUNTER — Telehealth: Payer: Self-pay | Admitting: Vascular Surgery

## 2015-12-26 NOTE — Telephone Encounter (Signed)
Sched lab 6/22 at 4 and MD 6/23 at 11. Spoke to pt to inform them of appts.

## 2015-12-26 NOTE — Telephone Encounter (Signed)
-----   Message from Sharee PimpleMarilyn K McChesney, RN sent at 12/24/2015 10:15 AM EDT ----- Regarding: schedule Patient had lab and BLC visit on 10-25-15 that was cancelled d/t finances.  Please reschedule both appts.   ----- Message -----    From: Fransisco HertzBrian L Chen, MD    Sent: 12/24/2015   7:15 AM      To: [id number] Charge Pool  Gerhard MunchJimmy F Bougher [id number] 03-04-1956  Pt was lost to follow up.  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

## 2015-12-27 ENCOUNTER — Emergency Department (HOSPITAL_BASED_OUTPATIENT_CLINIC_OR_DEPARTMENT_OTHER)
Admission: EM | Admit: 2015-12-27 | Discharge: 2015-12-27 | Disposition: A | Discharge status: Home / Self Care | Payer: Medicaid Other | Attending: Emergency Medicine | Admitting: Emergency Medicine

## 2015-12-27 ENCOUNTER — Encounter (HOSPITAL_BASED_OUTPATIENT_CLINIC_OR_DEPARTMENT_OTHER): Payer: Self-pay | Admitting: *Deleted

## 2015-12-27 ENCOUNTER — Emergency Department (HOSPITAL_BASED_OUTPATIENT_CLINIC_OR_DEPARTMENT_OTHER): Payer: Medicaid Other

## 2015-12-27 DIAGNOSIS — I10 Essential (primary) hypertension: Secondary | ICD-10-CM | POA: Insufficient documentation

## 2015-12-27 DIAGNOSIS — I4891 Unspecified atrial fibrillation: Secondary | ICD-10-CM | POA: Insufficient documentation

## 2015-12-27 DIAGNOSIS — Z87891 Personal history of nicotine dependence: Secondary | ICD-10-CM | POA: Diagnosis not present

## 2015-12-27 DIAGNOSIS — M25512 Pain in left shoulder: Secondary | ICD-10-CM | POA: Insufficient documentation

## 2015-12-27 DIAGNOSIS — F329 Major depressive disorder, single episode, unspecified: Secondary | ICD-10-CM | POA: Insufficient documentation

## 2015-12-27 DIAGNOSIS — E785 Hyperlipidemia, unspecified: Secondary | ICD-10-CM | POA: Insufficient documentation

## 2015-12-27 MED ORDER — OXYCODONE-ACETAMINOPHEN 5-325 MG PO TABS
2.0000 | ORAL_TABLET | Freq: Once | ORAL | Status: AC
  Administered 2015-12-27: 2 via ORAL
  Filled 2015-12-27: qty 2

## 2015-12-27 NOTE — ED Notes (Signed)
Left shoulder pain x 2 days. No injury.

## 2015-12-27 NOTE — ED Provider Notes (Signed)
CSN: [id number]     Arrival date & time 12/27/15  1852 History   First MD Initiated Contact with Patient 12/27/15 2112     Chief Complaint  Patient presents with  . Shoulder Pain   HPI  Mr. Caryn SectionFox is a 60 year old male with PMHx of HTN, PUD, PVD, GERD, SVT and OA presenting with left shoulder pain. Pt reports a fall approximately 7 months ago resulting in left shoulder and wrist pain. He had a complicated hospital course with osteomyelitis and sepsis from left wrist fracture. He states that his shoulder has been painful since the initial injury but he did not get it evaluated due to the serious nature of his wrist injury. He states that his wrist is fully healed and he is now wishing to get his shoulder evaluated. He reports daily pain for the past 7 months with intermittent worsening. He states that the pain is worst at night and he has difficulty getting comfortable to sleep. The pain has worsened over the past two days prompting his ED visit. He denies injury. He denies change in the pain. The pain is sharp and shooting and located over the lateral humeral head. The pain does not radiate. The pain is exacerbated by movement at the shoulder especially with abduction and internal rotation. He is a chronic pain patient and takes 30 mg oxycodone as needed. He states that these improve his pain but the pain returns after a few hours. Last dose of pain medicine was 9 hours prior to current evaluation. Denies weakness, numbness or tingling in the left arm. Denies chest pain, palpitations or SOB. He followed with Dr. Lajoyce Cornersuda and Dr. Magnus IvanBlackman.   Noted to be tachycardic in triage. Pt denies symptoms of chest pain, palpitations or SOB. Reports a history of SVT.   Past Medical History  Diagnosis Date  . Hypertension   . Chronic knee pain     "both sides"  . Hyperlipemia   . PUD (peptic ulcer disease)   . Peripheral vascular disease (HCC)   . Anxiety   . GERD (gastroesophageal reflux disease)   . 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 Atrial fibrillation (HCC) dx'd 04/2015  . Chronic bronchitis (HCC)     "used to get it q yr; nothing in the last ~ 5 yrs" (08/15/2015)  . Migraine     "q 3 months" (08/15/2015)  . Osteoarthritis of both knees   . Depression    Past Surgical History  Procedure Laterality Date  . I&d extremity Left 07/12/2013    Procedure: I&D Left Thigh Abscess;  Surgeon: Toni ArthursJohn [name], MD;  [facility];  Service: Orthopedics;  Laterality: Left;  . Replacement total knee bilateral Bilateral ~ 2007-2013"    "left-right"  . Shoulder arthroscopy w/ rotator cuff repair Bilateral 1992-1993    "right-left"  . Elbow arthroscopy with tendon reconstruction Right 1990  . 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 Incision and drainage abscess Right 04/11/2015    Procedure: INCISION AND DRAINAGE OF RIGHT THIGH ABSCESS;  Surgeon: Fransisco HertzBrian L Chen, MD;  [facility];  Service: Vascular;  Laterality: Right;  . Incision and drainage abscess Right 04/13/2015    Procedure: INCISION AND DRAINAGE THIGH ABSCESS;  Surgeon: Fransisco HertzBrian L Chen, MD;  [facility];  Service:  Vascular;  Laterality: Right;  . I&d extremity Left 04/17/2015    Procedure: IRRIGATION AND DEBRIDEMENT LEFT EXTREMITY;  Surgeon: Fransisco Hertz, MD;  Location: [facility] OR;  Service: Vascular;  Laterality: Left;  Marland Kitchen Muscle flap closure Right 04/19/2015    Procedure: RIGHT RECTUS ABDOMINUS  FLAP TO RIGHT GROIN;  Surgeon: Glenna Fellows, MD;  Location: [facility] OR;  Service: Plastics;  Laterality: Right;  . Femoral revision Right 04/19/2015    Procedure: OVERSEW RIGHT FEMORAL VEIN ;  Surgeon: Chuck Hint, MD;  Location: [facility] OR;  Service: Vascular;  Laterality: Right;  . Wound exploration Right 04/23/2015    Procedure: EXAM UNDER  ANESTHESIA AND VAC CHANGE RIGHT THIGH;  Surgeon: Glenna Fellows, MD;  Location: [facility] OR;  Service: Plastics;  Laterality: Right;  . Amputation Right 05/17/2015    Procedure: Right 1st and 2nd Toe Amputation;  Surgeon: Nadara Mustard, MD;  Location: [facility] OR;  Service: Orthopedics;  Laterality: Right;  . I&d extremity Left 07/09/2015    Procedure: IRRIGATION AND DEBRIDEMENT LEFT WRIST;  Surgeon: Knute Neu, MD;  Location: [facility] OR;  Service: Plastics;  Laterality: Left;  . I&d extremity Left 08/08/2015    Procedure: IRRIGATION AND DEBRIDEMENT EXTREMITY;  Surgeon: Knute Neu, MD;  Location: [facility] OR;  Service: Plastics;  Laterality: Left;  . Tonsillectomy  ~ 1972  . Appendectomy  ~ 1980  . Cholecystectomy  ~ 1982  . Inguinal hernia repair Right ~ 1969  . Hemorrhoidectomy with hemorrhoid banding  ~ 1985  . Knee arthroscopy Bilateral 1985-2008    "left-right"  . Joint replacement    . Uvulopalatoplasty  1991  . Cardiac catheterization  1993    "Dr. [place]; for vasospasms"  . Orif tibia fracture Left 08/17/2015    Procedure: OPEN REDUCTION INTERNAL FIXATION (ORIF) TIBIA FRACTURE;  Surgeon: Kathryne Hitch, MD;  Location: [facility] OR;  Service: Orthopedics;  Laterality: Left;   Family History  Problem Relation Age of Onset  . Heart attack Paternal Grandmother 50  . Heart disease Mother 63    arrythmia  . Stroke Paternal Grandfather   . Stroke Maternal Grandfather    Social History  Substance Use Topics  . Smoking status: Former Smoker -- 1.00 packs/day for 3 years    Types: Cigarettes  . Smokeless tobacco: Never Used     Comment: "quit smoking cigarettes in 1979  . Alcohol Use: No    Review of Systems  All other systems reviewed and are negative.     Allergies  Verapamil and Imitrex  Home Medications   Prior to Admission medications   Medication Sig Start Date End Date Taking? Authorizing Provider  albuterol (PROAIR HFA) 108 (90 BASE) MCG/ACT inhaler Inhale 1 puff into the  lungs every 6 (six) hours as needed for wheezing or shortness of breath.  09/06/13   Historical Provider, MD  apixaban (ELIQUIS) 5 MG TABS tablet Take 1 tablet (5 mg total) by mouth 2 (two) times daily. 08/30/15   Mcarthur Rossetti Angiulli, PA-C  atorvastatin (LIPITOR) 80 MG tablet Take 1 tablet (80 mg total) by mouth daily. 08/30/15   Mcarthur Rossetti Angiulli, PA-C  docusate sodium (COLACE) 100 MG capsule Take 1 capsule (100 mg total) by mouth 2 (two) times daily. 05/24/15   Mcarthur Rossetti Angiulli, PA-C  DULoxetine (CYMBALTA) 30 MG capsule Take 2 capsules (60 mg total) by mouth daily at 12 noon. 08/30/15   Mcarthur Rossetti Angiulli, PA-C  flecainide (TAMBOCOR) 100 MG tablet Take 1 tablet (100 mg  total) by mouth every 12 (twelve) hours. 08/30/15   Mcarthur Rossetti Angiulli, PA-C  gabapentin (NEURONTIN) 100 MG capsule Take 1 capsule (100 mg total) by mouth 3 (three) times daily. 08/30/15   Mcarthur Rossetti Angiulli, PA-C  methocarbamol (ROBAXIN) 500 MG tablet Take 1 tablet (500 mg total) by mouth every 6 (six) hours as needed for muscle spasms. 08/30/15   Mcarthur Rossetti Angiulli, PA-C  Metoprolol Tartrate 75 MG TABS Take 75 mg by mouth 2 (two) times daily. 08/30/15   Mcarthur Rossetti Angiulli, PA-C  morphine (MS CONTIN) 30 MG 12 hr tablet Take 3 tablets (90 mg total) by mouth every 12 (twelve) hours. 08/30/15   Mcarthur Rossetti Angiulli, PA-C  Multiple Vitamin (MULTIVITAMIN WITH MINERALS) TABS tablet Take 1 tablet by mouth daily. 05/24/15   Mcarthur Rossetti Angiulli, PA-C  naloxegol oxalate (MOVANTIK) 25 MG TABS tablet Take 1 tablet (25 mg total) by mouth daily before breakfast. 05/24/15   Mcarthur Rossetti Angiulli, PA-C  oxyCODONE (ROXICODONE) 30 MG immediate release tablet Take 1 tablet (30 mg total) by mouth every 4 (four) hours as needed for moderate pain. 08/30/15   Mcarthur Rossetti Angiulli, PA-C  oxyCODONE-acetaminophen (PERCOCET) 5-325 MG tablet Take 1-2 tablets by mouth every 6 (six) hours as needed. 12/02/15   Geoffery Lyons, MD  polyethylene glycol (MIRALAX / GLYCOLAX) packet Take 17 g by mouth daily.  08/09/15   Shanker Levora Dredge, MD  SUMAtriptan (IMITREX) 50 MG tablet Take 50 mg by mouth daily as needed for migraine or headache. Maximum 2 doses in 2 days 02/15/15   Historical Provider, MD  testosterone cypionate (DEPOTESTOSTERONE CYPIONATE) 200 MG/ML injection Inject 100 mg into the muscle every 14 (fourteen) days. Reported on 08/05/2015 06/25/15   Historical Provider, MD  trifluridine (VIROPTIC) 1 % ophthalmic solution Place 1 drop into both eyes 2 (two) times daily as needed (breakouts (blurry, matted eyelids)).    Historical Provider, MD  venlafaxine XR (EFFEXOR-XR) 75 MG 24 hr capsule Take 1 capsule (75 mg total) by mouth daily with breakfast. 08/30/15   Mcarthur Rossetti Angiulli, PA-C   BP 141/95 mmHg  Pulse 89  Temp(Src) 98.4 F (36.9 C) (Oral)  Resp 20  Ht  (1.905 m)  Wt 116.574 kg  BMI 32.12 kg/m2  SpO2 99% Physical Exam  Constitutional: He appears well-developed and well-nourished. No distress.  HENT:  Head: Normocephalic and atraumatic.  Right Ear: External ear normal.  Left Ear: External ear normal.  Eyes: Conjunctivae are normal. Right eye exhibits no discharge. Left eye exhibits no discharge. No scleral icterus.  Neck: Normal range of motion.  Cardiovascular: Normal rate, regular rhythm, normal heart sounds and intact distal pulses.   Hands warm with cap refill < 2 seconds  Pulmonary/Chest: Effort normal and breath sounds normal. No respiratory distress. He has no wheezes. He has no rales.  Musculoskeletal:       Left shoulder: He exhibits decreased range of motion and tenderness. He exhibits no swelling, no crepitus, no deformity, normal pulse and normal strength.  Focal TTP at the left lateral humeral head. No TTP of the clavicle, AC joint, scapula or humeral shaft. Restricted active ROM secondary to pain. Full passive ROM with pain on extension past 60 degrees, abduction past 45 degrees, internal rotation and external rotation. Shoulder flexion intact and without significant  pain. No swelling or deformity at the shoulder joint. Positive Neer's and Hawkins. FROM at the left elbow and wrist without pain.   Neurological: He is alert. Coordination normal.  5/5 strength of BUE. Sensation to light touch intact throughout.   Skin: Skin is warm and dry.  No erythema or warmth of the shoulder joint  Psychiatric: He has a normal mood and affect. His behavior is normal.  Nursing note and vitals reviewed.   ED Course  Procedures (including critical care time) Labs Review Labs Reviewed - No data to display  Imaging Review Dg Shoulder Left  12/27/2015  CLINICAL DATA:  Pain and limitation of motion EXAM: LEFT SHOULDER - 2+ VIEW COMPARISON:  April 07, 2015 FINDINGS: Frontal, Y scapular, and axillary images were obtained. There is moderate osteoarthritic change in the glenohumeral joint with slightly milder osteoarthritic change in the acromioclavicular joint. No erosive change. No fracture or dislocation. Visualized left lung is clear. IMPRESSION: Osteoarthritic change, most notably in the glenohumeral joint. No acute fracture or dislocation. Electronically Signed   By: Bretta Bang III M.D.   On: 12/27/2015 19:56   I have personally reviewed and evaluated these images and lab results as part of my medical decision-making.   EKG Interpretation   Date/Time:  Friday December 27 2015 19:11:18 EDT Ventricular Rate:  119 PR Interval:  174 QRS Duration: 104 QT Interval:  298 QTC Calculation: 419 R Axis:   -57 Text Interpretation:  Sinus tachycardia Incomplete right bundle branch  block Left anterior fascicular block Cannot rule out Anterior infarct ,  age undetermined Abnormal ECG Confirmed by [facility] MD, Barbara Cower [phone]) on  12/27/2015 9:00:05 PM      MDM   Final diagnoses:  Left shoulder pain   60 year old male presenting with left shoulder pain 7 months after a fall. Initially tachycardic and hypertensive. Once patient was moved back to a room and pain medication  given, tachycardia resolved to 89 and blood pressure improved to 141/95. EKG with sinus tachycardia. Nontoxic-appearing. Left upper extremity is neurovascularly intact. Restricted range of motion of the left shoulder secondary to pain. Full passive range of motion intact. Positive Hawkins and Neer's. Shoulder x-ray shows osteoarthritic changes without acute injury. Patient is followed by pain clinic so will not be discharged with narcotic medication. Given the chronic nature of the pain and positive findings for rotator cuff pathology, strongly recommend follow-up with his orthopedic doctor. Return precautions given in discharge paperwork and discussed with pt at bedside. Pt stable for discharge     Alveta Heimlich, PA-C 12/28/15 0032  Marily Memos, MD 12/28/15 1505

## 2015-12-27 NOTE — Discharge Instructions (Signed)
Shoulder Pain  The shoulder is the joint that connects your arms to your body. The bones that form the shoulder joint include the upper arm bone (humerus), the shoulder blade (scapula), and the collarbone (clavicle). The top of the humerus is shaped like a ball and fits into a rather flat socket on the scapula (glenoid cavity). A combination of muscles and strong, fibrous tissues that connect muscles to bones (tendons) support your shoulder joint and hold the ball in the socket. Small, fluid-filled sacs (bursae) are located in different areas of the joint. They act as cushions between the bones and the overlying soft tissues and help reduce friction between the gliding tendons and the bone as you move your arm. Your shoulder joint allows a wide range of motion in your arm. This range of motion allows you to do things like scratch your back or throw a ball. However, this range of motion also makes your shoulder more prone to pain from overuse and injury.  Causes of shoulder pain can originate from both injury and overuse and usually can be grouped in the following four categories:  1. Redness, swelling, and pain (inflammation) of the tendon (tendinitis) or the bursae (bursitis).  2. Instability, such as a dislocation of the joint.  3. Inflammation of the joint (arthritis).  4. Broken bone (fracture).  HOME CARE INSTRUCTIONS   1. Apply ice to the sore area.  ¨ Put ice in a plastic bag.  ¨ Place a towel between your skin and the bag.  ¨ Leave the ice on for 15-20 minutes, 3-4 times per day for the first 2 days, or as directed by your health care provider.  2. Stop using cold packs if they do not help with the pain.  3. If you have a shoulder sling or immobilizer, wear it as long as your caregiver instructs. Only remove it to shower or bathe. Move your arm as little as possible, but keep your hand moving to prevent swelling.  4. Squeeze a soft ball or foam pad as much as possible to help prevent swelling.  5. Only take  over-the-counter or prescription medicines for pain, discomfort, or fever as directed by your caregiver.  SEEK MEDICAL CARE IF:   1. Your shoulder pain increases, or new pain develops in your arm, hand, or fingers.  2. Your hand or fingers become cold and numb.  3. Your pain is not relieved with medicines.  SEEK IMMEDIATE MEDICAL CARE IF:   1. Your arm, hand, or fingers are numb or tingling.  2. Your arm, hand, or fingers are significantly swollen or turn white or blue.  MAKE SURE YOU:   1. Understand these instructions.  2. Will watch your condition.  3. Will get help right away if you are not doing well or get worse.     This information is not intended to replace advice given to you by your health care provider. Make sure you discuss any questions you have with your health care provider.     Document Released: 04/22/2005 Document Revised: 08/03/2014 Document Reviewed: 11/05/2014  Elsevier Interactive Patient Education ©2016 [employer]  Shoulder Range of Motion Exercises  Shoulder range of motion (ROM) exercises are designed to keep the shoulder moving freely. They are often recommended for people who have shoulder pain.  MOVEMENT EXERCISE  When you are able, do this exercise 5-6 days per week, or as told by your health care provider. Work toward doing 2 sets of 10 swings.  Pendulum   Exercise  How To Do This Exercise Lying Down  5. Lie face-down on a bed with your abdomen close to the side of the bed.  6. Let your arm hang over the side of the bed.  7. Relax your shoulder, arm, and hand.  8. Slowly and gently swing your arm forward and back. Do not use your neck muscles to swing your arm. They should be relaxed. If you are struggling to swing your arm, have someone gently swing it for you. When you do this exercise for the first time, swing your arm at a 15 degree angle for 15 seconds, or swing your arm 10 times. As pain lessens over time, increase the angle of the swing to 30-45 degrees.  9. Repeat steps 1-4  with the other arm.  How To Do This Exercise While Standing  6. Stand next to a sturdy chair or table and hold on to it with your hand.  ¨ Bend forward at the waist.  ¨ Bend your knees slightly.  ¨ Relax your other arm and let it hang limp.  ¨ Relax the shoulder blade of the arm that is hanging and let it drop.  ¨ While keeping your shoulder relaxed, use body motion to swing your arm in small circles. The first time you do this exercise, swing your arm for about 30 seconds or 10 times. When you do it next time, swing your arm for a little longer.  ¨ Stand up tall and relax.  ¨ Repeat steps 1-7, this time changing the direction of the circles.  7. Repeat steps 1-8 with the other arm.  STRETCHING EXERCISES  Do these exercises 3-4 times per day on 5-6 days per week or as told by your health care provider. Work toward holding the stretch for 20 seconds.  Stretching Exercise 1  4. Lift your arm straight out in front of you.  5. Bend your arm 90 degrees at the elbow (right angle) so your forearm goes across your body and looks like the letter "L."  6. Use your other arm to gently pull the elbow forward and across your body.  7. Repeat steps 1-3 with the other arm.  Stretching Exercise 2  You will need a towel or rope for this exercise.  3. Bend one arm behind your back with the palm facing outward.  4. Hold a towel with your other hand.  5. Reach the arm that holds the towel above your head, and bend that arm at the elbow. Your wrist should be behind your neck.  6. Use your free hand to grab the free end of the towel.  7. With the higher hand, gently pull the towel up behind you.  8. With the lower hand, pull the towel down behind you.  9. Repeat steps 1-6 with the other arm.  STRENGTHENING EXERCISES  Do each of these exercises at four different times of day (sessions) every day or as told by your health care provider. To begin with, repeat each exercise 5 times (repetitions). Work toward doing 3 sets of 12 repetitions or  as told by your health care provider.  Strengthening Exercise 1  You will need a light weight for this activity. As you grow stronger, you may use a heavier weight.  4. Standing with a weight in your hand, lift your arm straight out to the side until it is at the same height as your shoulder.  5. Bend your arm at 90 degrees so that your   fingers are pointing to the ceiling.  6. Slowly raise your hand until your arm is straight up in the air.  7. Repeat steps 1-3 with the other arm.  Strengthening Exercise 2  You will need a light weight for this activity. As you grow stronger, you may use a heavier weight.  1. Standing with a weight in your hand, gradually move your straight arm in an arc, starting at your side, then out in front of you, then straight up over your head.  2. Gradually move your other arm in an arc, starting at your side, then out in front of you, then straight up over your head.  3. Repeat steps 1-2 with the other arm.  Strengthening Exercise 3  You will need an elastic band for this activity. As you grow stronger, gradually increase the size of the bands or increase the number of bands that you use at one time.  1. While standing, hold an elastic band in one hand and raise that arm up in the air.  2. With your other hand, pull down the band until that hand is by your side.  3. Repeat steps 1-2 with the other arm.     This information is not intended to replace advice given to you by your health care provider. Make sure you discuss any questions you have with your health care provider.     Document Released: 04/11/2003 Document Revised: 11/27/2014 Document Reviewed: 07/09/2014  Elsevier Interactive Patient Education ©2016 [employer]

## 2015-12-27 NOTE — ED Notes (Signed)
Pt verbalizes understanding of d/c instructions and denies any further needs at this time. 

## 2015-12-31 ENCOUNTER — Emergency Department (HOSPITAL_BASED_OUTPATIENT_CLINIC_OR_DEPARTMENT_OTHER)
Admission: EM | Admit: 2015-12-31 | Discharge: 2015-12-31 | Disposition: A | Discharge status: Home / Self Care | Payer: Medicaid Other | Attending: Emergency Medicine | Admitting: Emergency Medicine

## 2015-12-31 ENCOUNTER — Encounter (HOSPITAL_BASED_OUTPATIENT_CLINIC_OR_DEPARTMENT_OTHER): Payer: Self-pay | Admitting: Emergency Medicine

## 2015-12-31 DIAGNOSIS — I739 Peripheral vascular disease, unspecified: Secondary | ICD-10-CM | POA: Diagnosis not present

## 2015-12-31 DIAGNOSIS — I10 Essential (primary) hypertension: Secondary | ICD-10-CM | POA: Diagnosis not present

## 2015-12-31 DIAGNOSIS — M1712 Unilateral primary osteoarthritis, left knee: Secondary | ICD-10-CM | POA: Diagnosis not present

## 2015-12-31 DIAGNOSIS — F329 Major depressive disorder, single episode, unspecified: Secondary | ICD-10-CM | POA: Diagnosis not present

## 2015-12-31 DIAGNOSIS — M25512 Pain in left shoulder: Secondary | ICD-10-CM | POA: Insufficient documentation

## 2015-12-31 DIAGNOSIS — E785 Hyperlipidemia, unspecified: Secondary | ICD-10-CM | POA: Insufficient documentation

## 2015-12-31 DIAGNOSIS — Z79899 Other long term (current) drug therapy: Secondary | ICD-10-CM | POA: Diagnosis not present

## 2015-12-31 DIAGNOSIS — M1711 Unilateral primary osteoarthritis, right knee: Secondary | ICD-10-CM | POA: Diagnosis not present

## 2015-12-31 DIAGNOSIS — Z87891 Personal history of nicotine dependence: Secondary | ICD-10-CM | POA: Diagnosis not present

## 2015-12-31 DIAGNOSIS — G8929 Other chronic pain: Secondary | ICD-10-CM

## 2015-12-31 DIAGNOSIS — I4891 Unspecified atrial fibrillation: Secondary | ICD-10-CM | POA: Diagnosis not present

## 2015-12-31 HISTORY — DX: Other chronic pain: G89.29

## 2015-12-31 HISTORY — DX: Pain in left shoulder: M25.512

## 2015-12-31 MED ORDER — OXYCODONE-ACETAMINOPHEN 5-325 MG PO TABS
2.0000 | ORAL_TABLET | Freq: Once | ORAL | Status: AC
  Administered 2015-12-31: 2 via ORAL
  Filled 2015-12-31: qty 2

## 2015-12-31 NOTE — ED Provider Notes (Signed)
CSN: [id number]     Arrival date & time 12/31/15  0850 History   First MD Initiated Contact with Patient 12/31/15 [phone]     No chief complaint on file.    (Consider location/radiation/quality/duration/timing/severity/associated sxs/prior Treatment) HPI   60 year old male with history of chronic pain, atrial fibrillation currently on Eliquis, osteoarthritis presenting with left shoulder pain. Patient reports several month ago he fell and injured his left shoulder and wrist. He has had left shoulder pain since his injury. He reports a fall in the standing position. He is being cared for by several orthopedists including Dr. Lajoyce Cornersuda, Dr. Magnus IvanBlackman, and states that in the past Dr. Rennis ChrisSupple performed rotator cuff repair on his left shoulder. He does follow-up with the pain management clinic but states that his pain medication ran out early due to "my son stole it from me". He has been without his pain medication for the past 4 days. He has been using ice to help with his pain with minimal improvement. Reports increasing pain at nighttime and with range of motion. Describe pain as a sharp shooting sensation but nonradiating and denies any neck, elbow, or wrist pain. No fever or rash. No chest pain or shortness of breath. No jaw pain or any other cardiac related symptoms. This is his chronic pain. He is waiting to follow-up with his PCP for medication refill and referral to orthopedist. He is requesting for help with pain management.  Past Medical History  Diagnosis Date  . Hypertension   . Chronic knee pain     "both sides"  . Hyperlipemia   . PUD (peptic ulcer disease)   . Peripheral vascular disease (HCC)   . Anxiety   . GERD (gastroesophageal reflux disease)   . 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 Atrial fibrillation (HCC) dx'd 04/2015  . Chronic bronchitis (HCC)     "used to get it q yr; nothing in the last ~ 5 yrs" (08/15/2015)  . Migraine    "q 3 months" (08/15/2015)  . Osteoarthritis of both knees   . Depression    Past Surgical History  Procedure Laterality Date  . I&d extremity Left 07/12/2013    Procedure: I&D Left Thigh Abscess;  Surgeon: Toni ArthursJohn [name], MD;  [facility];  Service: Orthopedics;  Laterality: Left;  . Replacement total knee bilateral Bilateral ~ 2007-2013"    "left-right"  . Shoulder arthroscopy w/ rotator cuff repair Bilateral 1992-1993    "right-left"  . Elbow arthroscopy with tendon reconstruction Right 1990  . 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 Incision and drainage abscess Right 04/11/2015    Procedure: INCISION AND DRAINAGE OF RIGHT THIGH ABSCESS;  Surgeon: Fransisco HertzBrian L Chen, MD;  [facility];  Service: Vascular;  Laterality: Right;  . Incision and drainage abscess Right 04/13/2015    Procedure: INCISION AND DRAINAGE THIGH ABSCESS;  Surgeon: Fransisco HertzBrian L Chen, MD;  [facility];  Service: Vascular;  Laterality: Right;  . I&d extremity Left 04/17/2015    Procedure: IRRIGATION AND DEBRIDEMENT LEFT EXTREMITY;  Surgeon: Fransisco HertzBrian L Chen, MD;  [facility];  Service: Vascular;  Laterality: Left;  Marland Kitchen. Muscle flap closure Right 04/19/2015    Procedure: RIGHT RECTUS ABDOMINUS  FLAP TO RIGHT GROIN;  Surgeon: Glenna FellowsBrinda Thimmappa, MD;  Location: [facility] OR;  Service: Plastics;  Laterality: Right;  . Femoral revision Right 04/19/2015    Procedure: OVERSEW RIGHT FEMORAL VEIN ;  Surgeon: Chuck Hint, MD;  Location: [facility] OR;  Service: Vascular;  Laterality: Right;  . Wound exploration Right 04/23/2015    Procedure: EXAM UNDER ANESTHESIA AND VAC CHANGE RIGHT THIGH;  Surgeon: Glenna Fellows, MD;  Location: [facility] OR;  Service: Plastics;  Laterality: Right;  . Amputation Right 05/17/2015    Procedure: Right 1st and 2nd Toe Amputation;  Surgeon: Nadara Mustard, MD;   Location: [facility] OR;  Service: Orthopedics;  Laterality: Right;  . I&d extremity Left 07/09/2015    Procedure: IRRIGATION AND DEBRIDEMENT LEFT WRIST;  Surgeon: Knute Neu, MD;  Location: [facility] OR;  Service: Plastics;  Laterality: Left;  . I&d extremity Left 08/08/2015    Procedure: IRRIGATION AND DEBRIDEMENT EXTREMITY;  Surgeon: Knute Neu, MD;  Location: [facility] OR;  Service: Plastics;  Laterality: Left;  . Tonsillectomy  ~ 1972  . Appendectomy  ~ 1980  . Cholecystectomy  ~ 1982  . Inguinal hernia repair Right ~ 1969  . Hemorrhoidectomy with hemorrhoid banding  ~ 1985  . Knee arthroscopy Bilateral 1985-2008    "left-right"  . Joint replacement    . Uvulopalatoplasty  1991  . Cardiac catheterization  1993    "Dr. [place]; for vasospasms"  . Orif tibia fracture Left 08/17/2015    Procedure: OPEN REDUCTION INTERNAL FIXATION (ORIF) TIBIA FRACTURE;  Surgeon: Kathryne Hitch, MD;  Location: [facility] OR;  Service: Orthopedics;  Laterality: Left;   Family History  Problem Relation Age of Onset  . Heart attack Paternal Grandmother 60  . Heart disease Mother 65    arrythmia  . Stroke Paternal Grandfather   . Stroke Maternal Grandfather    Social History  Substance Use Topics  . Smoking status: Former Smoker -- 1.00 packs/day for 3 years    Types: Cigarettes  . Smokeless tobacco: Never Used     Comment: "quit smoking cigarettes in 1979  . Alcohol Use: No    Review of Systems  Constitutional: Negative for fever.  Cardiovascular: Negative for chest pain.  Musculoskeletal: Positive for arthralgias.  Skin: Negative for rash and wound.  Neurological: Negative for numbness.      Allergies  Verapamil and Imitrex  Home Medications   Prior to Admission medications   Medication Sig Start Date End Date Taking? Authorizing Provider  albuterol (PROAIR HFA) 108 (90 BASE) MCG/ACT inhaler Inhale 1 puff into the lungs every 6 (six) hours as needed for wheezing or shortness of breath.  09/06/13    Historical Provider, MD  apixaban (ELIQUIS) 5 MG TABS tablet Take 1 tablet (5 mg total) by mouth 2 (two) times daily. 08/30/15   Mcarthur Rossetti Angiulli, PA-C  atorvastatin (LIPITOR) 80 MG tablet Take 1 tablet (80 mg total) by mouth daily. 08/30/15   Mcarthur Rossetti Angiulli, PA-C  docusate sodium (COLACE) 100 MG capsule Take 1 capsule (100 mg total) by mouth 2 (two) times daily. 05/24/15   Mcarthur Rossetti Angiulli, PA-C  DULoxetine (CYMBALTA) 30 MG capsule Take 2 capsules (60 mg total) by mouth daily at 12 noon. 08/30/15   Mcarthur Rossetti Angiulli, PA-C  flecainide (TAMBOCOR) 100 MG tablet Take 1 tablet (100 mg total) by mouth every 12 (twelve) hours. 08/30/15   Mcarthur Rossetti Angiulli, PA-C  gabapentin (NEURONTIN) 100 MG capsule Take 1 capsule (100 mg total) by mouth 3 (three) times daily. 08/30/15   Mcarthur Rossetti Angiulli, PA-C  methocarbamol (ROBAXIN) 500 MG tablet Take  1 tablet (500 mg total) by mouth every 6 (six) hours as needed for muscle spasms. 08/30/15   Mcarthur Rossetti Angiulli, PA-C  Metoprolol Tartrate 75 MG TABS Take 75 mg by mouth 2 (two) times daily. 08/30/15   Mcarthur Rossetti Angiulli, PA-C  morphine (MS CONTIN) 30 MG 12 hr tablet Take 3 tablets (90 mg total) by mouth every 12 (twelve) hours. 08/30/15   Mcarthur Rossetti Angiulli, PA-C  Multiple Vitamin (MULTIVITAMIN WITH MINERALS) TABS tablet Take 1 tablet by mouth daily. 05/24/15   Mcarthur Rossetti Angiulli, PA-C  naloxegol oxalate (MOVANTIK) 25 MG TABS tablet Take 1 tablet (25 mg total) by mouth daily before breakfast. 05/24/15   Mcarthur Rossetti Angiulli, PA-C  oxyCODONE (ROXICODONE) 30 MG immediate release tablet Take 1 tablet (30 mg total) by mouth every 4 (four) hours as needed for moderate pain. 08/30/15   Mcarthur Rossetti Angiulli, PA-C  oxyCODONE-acetaminophen (PERCOCET) 5-325 MG tablet Take 1-2 tablets by mouth every 6 (six) hours as needed. 12/02/15   Geoffery Lyons, MD  polyethylene glycol (MIRALAX / GLYCOLAX) packet Take 17 g by mouth daily. 08/09/15   Shanker Levora Dredge, MD  SUMAtriptan (IMITREX) 50 MG tablet Take 50 mg by  mouth daily as needed for migraine or headache. Maximum 2 doses in 2 days 02/15/15   Historical Provider, MD  testosterone cypionate (DEPOTESTOSTERONE CYPIONATE) 200 MG/ML injection Inject 100 mg into the muscle every 14 (fourteen) days. Reported on 08/05/2015 06/25/15   Historical Provider, MD  trifluridine (VIROPTIC) 1 % ophthalmic solution Place 1 drop into both eyes 2 (two) times daily as needed (breakouts (blurry, matted eyelids)).    Historical Provider, MD  venlafaxine XR (EFFEXOR-XR) 75 MG 24 hr capsule Take 1 capsule (75 mg total) by mouth daily with breakfast. 08/30/15   Mcarthur Rossetti Angiulli, PA-C   BP 152/100 mmHg  Pulse 102  Temp(Src) 97.6 F (36.4 C) (Oral)  Resp 22  Ht  (1.905 m)  Wt 115.667 kg  BMI 31.87 kg/m2  SpO2 100% Physical Exam  Constitutional: He appears well-developed and well-nourished. No distress.  Caucasian male, laying in bed in no acute discomfort, nontoxic.  HENT:  Head: Atraumatic.  Eyes: Conjunctivae are normal.  Neck: Normal range of motion. Neck supple.  No cervical midline spine tenderness crepitus or step-off.  Cardiovascular: Intact distal pulses.   Musculoskeletal: He exhibits tenderness (Left shoulder: Minimal tenderness to palpation of shoulder however decreased active range of motion globally but normal passive range of motion. No overlying skin changes, no deformity.).  Left elbow and left wrist are nontender to palpation. Radial pulse 2+. Arm compartment is soft.  Neurological: He is alert.  Skin: No rash noted.  Psychiatric: He has a normal mood and affect.  Nursing note and vitals reviewed.   ED Course  Procedures (including critical care time)   MDM   Final diagnoses:  Chronic left shoulder pain    BP 152/100 mmHg  Pulse 102  Temp(Src) 97.6 F (36.4 C) (Oral)  Resp 22  Ht  (1.905 m)  Wt 115.667 kg  BMI 31.87 kg/m2  SpO2 100%   9:18 AM Patient follows a pain clinic and is here due to increasing left shoulder pain due  to losing his pain medication. He is neurovascularly intact. His pain is related to his chronic pain. Patient understands I cannot prescribe narcotic pain medication for his symptoms. I encourage patient to follow-up closely with his doctor for further management of his condition. His pain does suggest that he  has a rotator cuff injury less likely to be adhesive capsulitis. I have low suspicion for septic joint or ACS. Patient understands and will follow-up with his doctor for further care. Return precaution discussed.  Fayrene Helper, PA-C 12/31/15 0920  Lavera Guise, MD 12/31/15 [phone]

## 2015-12-31 NOTE — ED Notes (Signed)
Pt having left shoulder pain for a while.  Pt states he sees pain clinic but is out of his medication until 01-08-16.  Pt states he is under contract.  Pt also needs referral for ortho.

## 2015-12-31 NOTE — Discharge Instructions (Signed)

## 2016-01-09 ENCOUNTER — Encounter: Payer: Self-pay | Admitting: Vascular Surgery

## 2016-01-11 IMAGING — DX DG KNEE COMPLETE 4+V*L*
4 series · 4 of 4 positions shown · non-contrast
Comparison: 12/30/2014

CLINICAL DATA: Patient fell at noon on 03/31/2015.  Left knee pain.

EXAM:
LEFT KNEE - COMPLETE 4+ VIEW

[knee ap]
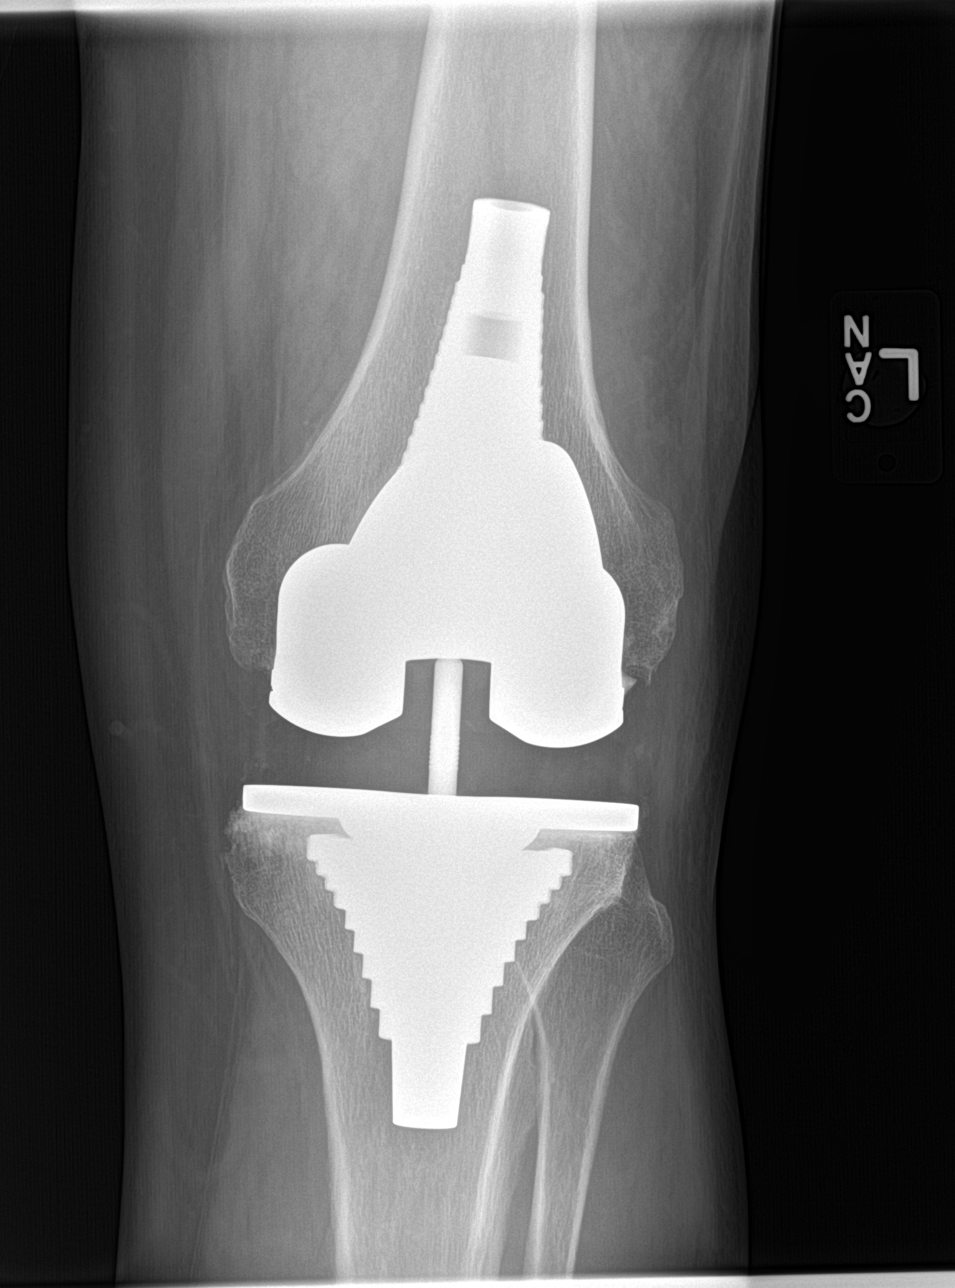

[tunnel]
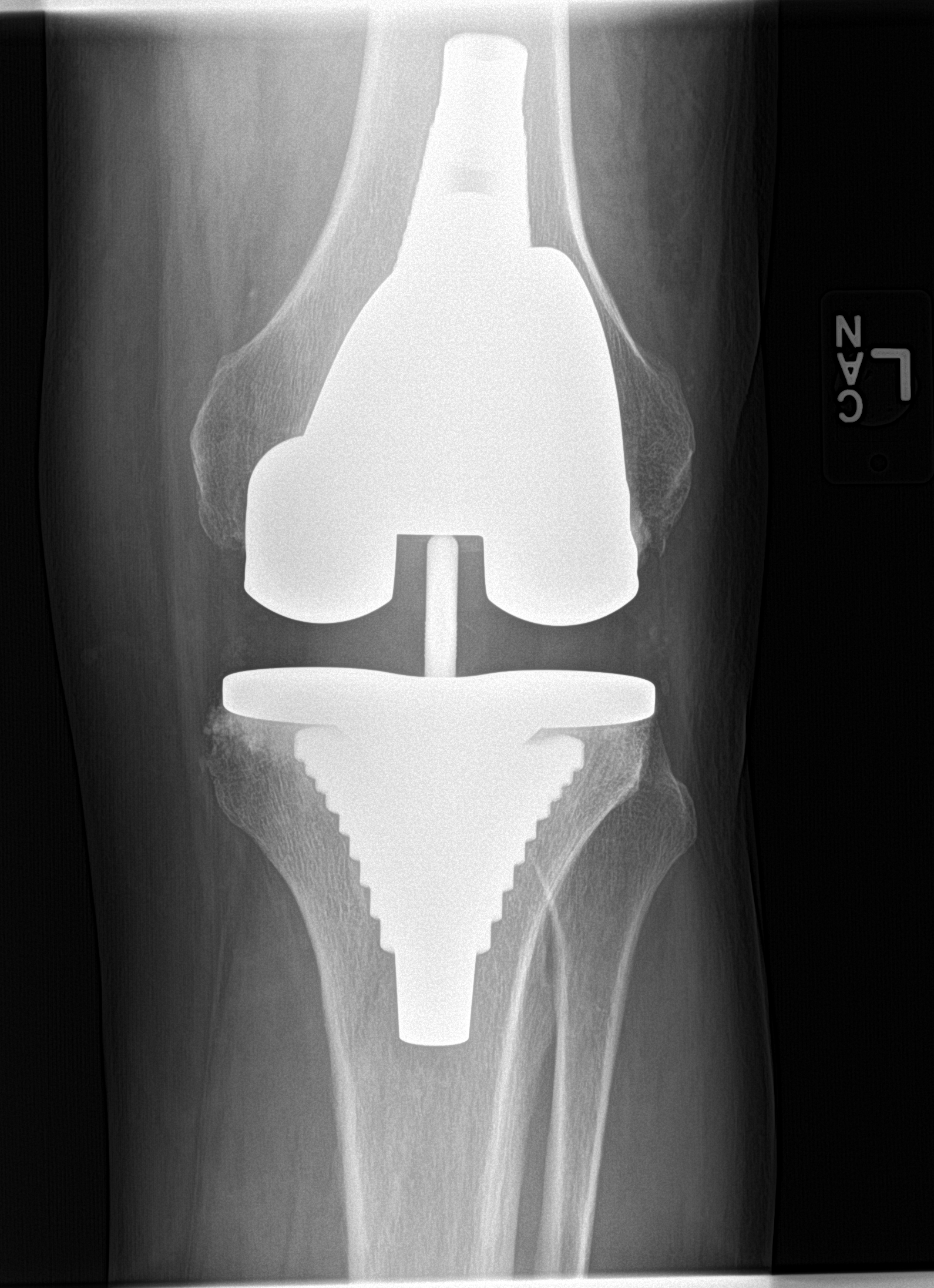

[knee lat]
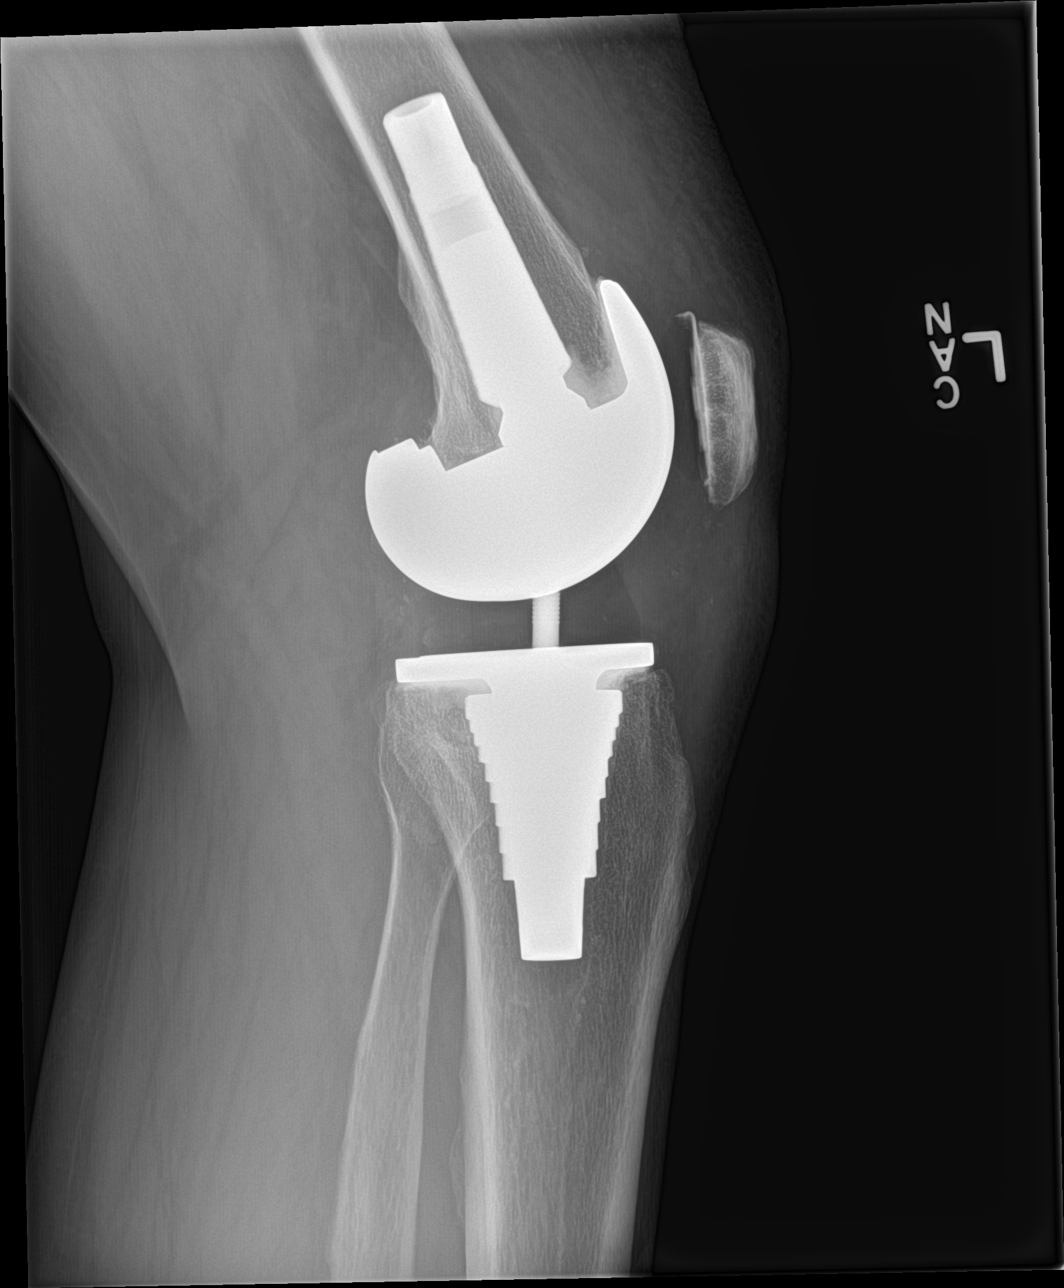

[knee sunrise]
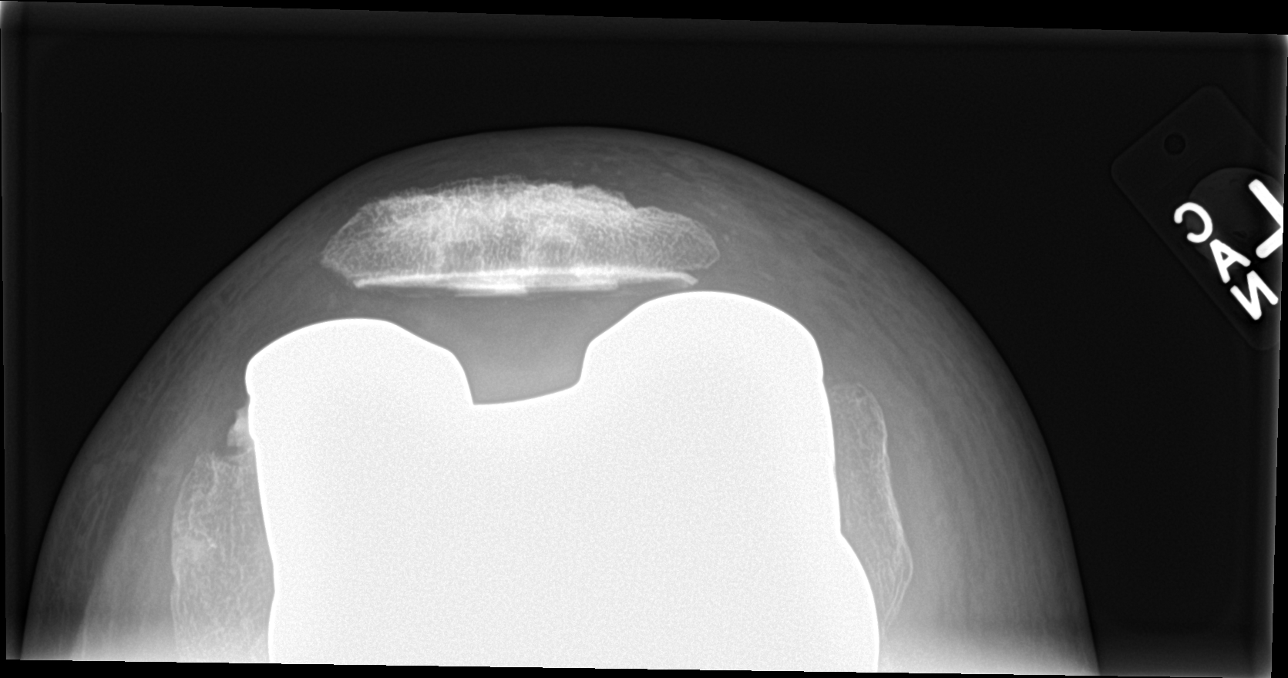

[4 of 4 positions shown; findings below may reference images not displayed]

FINDINGS: Previous left total knee arthroplasty. Patellar femoral component is
present. Components appear well seated. Mild diffuse soft tissue
swelling similar to previous study. No significant effusion. No
evidence of acute fracture or dislocation.
IMPRESSION: Postoperative left total knee arthroplasty. Components appear well
seated. Diffuse soft tissue swelling similar to prior study. No
acute bony abnormalities.

## 2016-01-15 NOTE — Progress Notes (Signed)
Established Critical Limb Ischemia Patient  History of Present Illness  [name] is a 60 y.o. ([birth year]) male who presents with chief complaint: fast heart rate.  Procedures included:  1.  R thigh abscess I&D, excision of infected R SFA aneurysm (04/11/15) 2.  Repeat I&D R thigh abscess, VAC (04/13/15) 3.  Washout of R thigh abscess cavity, VAC (04/15/15) 4.  Washout of R thigh abscess cavity, VAC (04/17/15) 5.  Ligation of ruptured R femoral vein (04/19/15) by Dr. Edilia Bo 6.  Rectus abdominus muscle flap to right groin (04/19/15) by Dr. Leta Baptist 7.  VAC, seroma drainage (04/23/15) by DR. Thimmappa 8.  R 1st and 2nd ray amp (05/17/15)  This patient was lost to follow up reported due to financial reasons, though his care was still in the global period.  The patient has no rest pain and all wounds have healed including his R 1st and 2nd ray amp.  The patient notes tachycardia up to 200 beats/minute leading ED visits and swelling legs.  The patient's treatment regimen currently included: maximal medical management and compression stockings.  The patient's PMH, PSH, SH, and FamHx are unchanged from 07/26/15.  Current Outpatient Prescriptions  Medication Sig Dispense Refill  . albuterol (PROAIR HFA) 108 (90 BASE) MCG/ACT inhaler Inhale 1 puff into the lungs every 6 (six) hours as needed for wheezing or shortness of breath.     Marland Kitchen apixaban (ELIQUIS) 5 MG TABS tablet Take 1 tablet (5 mg total) by mouth 2 (two) times daily. 60 tablet 0  . atorvastatin (LIPITOR) 80 MG tablet Take 1 tablet (80 mg total) by mouth daily. 30 tablet 1  . docusate sodium (COLACE) 100 MG capsule Take 1 capsule (100 mg total) by mouth 2 (two) times daily. 10 capsule 0  . DULoxetine (CYMBALTA) 30 MG capsule Take 2 capsules (60 mg total) by mouth daily at 12 noon. 30 capsule 0  . flecainide (TAMBOCOR) 100 MG tablet Take 1 tablet (100 mg total) by mouth every 12 (twelve) hours. 60 tablet 1  . gabapentin (NEURONTIN) 100 MG  capsule Take 1 capsule (100 mg total) by mouth 3 (three) times daily. 90 capsule 1  . methocarbamol (ROBAXIN) 500 MG tablet Take 1 tablet (500 mg total) by mouth every 6 (six) hours as needed for muscle spasms. 90 tablet 0  . Metoprolol Tartrate 75 MG TABS Take 75 mg by mouth 2 (two) times daily. 60 tablet 1  . morphine (MS CONTIN) 30 MG 12 hr tablet Take 3 tablets (90 mg total) by mouth every 12 (twelve) hours. 90 tablet 0  . Multiple Vitamin (MULTIVITAMIN WITH MINERALS) TABS tablet Take 1 tablet by mouth daily.    . naloxegol oxalate (MOVANTIK) 25 MG TABS tablet Take 1 tablet (25 mg total) by mouth daily before breakfast. 30 tablet 0  . oxyCODONE (ROXICODONE) 30 MG immediate release tablet Take 1 tablet (30 mg total) by mouth every 4 (four) hours as needed for moderate pain. 90 tablet 0  . oxyCODONE-acetaminophen (PERCOCET) 5-325 MG tablet Take 1-2 tablets by mouth every 6 (six) hours as needed. 15 tablet 0  . polyethylene glycol (MIRALAX / GLYCOLAX) packet Take 17 g by mouth daily. 100 each 0  . SUMAtriptan (IMITREX) 50 MG tablet Take 50 mg by mouth daily as needed for migraine or headache. Maximum 2 doses in 2 days  0  . testosterone cypionate (DEPOTESTOSTERONE CYPIONATE) 200 MG/ML injection Inject 100 mg into the muscle every 14 (fourteen) days. Reported on 08/05/2015  0  . trifluridine (VIROPTIC) 1 % ophthalmic solution Place 1 drop into both eyes 2 (two) times daily as needed (breakouts (blurry, matted eyelids)).    Marland Kitchen. venlafaxine XR (EFFEXOR-XR) 75 MG 24 hr capsule Take 1 capsule (75 mg total) by mouth daily with breakfast. 30 capsule 1  . [DISCONTINUED] diphenhydrAMINE (BENADRYL) 25 MG tablet Take 1 tablet (25 mg total) by mouth every 6 (six) hours. (Patient not taking: Reported on 02/25/2015) 20 tablet 0  . [DISCONTINUED] pantoprazole (PROTONIX) 40 MG tablet Take 1 tablet (40 mg total) by mouth daily. (Patient taking differently: Take 40 mg by mouth daily as needed (heartburn). ) 30 tablet 0    No current facility-administered medications for this visit.    Allergies  Allergen Reactions  . Verapamil Other (See Comments)    Causes pvc's  . Imitrex [Sumatriptan] Other (See Comments)    Chest pain, 2004 (tolerates 50 mg prn)    On ROS today: bilateral leg swelling, no intermittent claudication or rest pain   Physical Examination  Filed Vitals:   01/17/16 1113  BP: 126/90  Pulse: 102  Temp: 97.4 F (36.3 C)  Resp: 18  Height: 6\' 3"  (1.905 m)  Weight: 272 lb (123.378 kg)  SpO2: 96%   Body mass index is 34 kg/(m^2).  General: A&O x 3, WDWN  Eyes: PERRLA, EOMI  Pulmonary: Sym exp, good air movt, CTAB, no rales, rhonchi, & wheezing  Cardiac: RRR, Nl S1, S2, no Murmurs, rubs or gallops  Vascular: Vessel Right Left  Radial Palpable Palpable  Brachial Palpable Palpable  Carotid Palpable, without bruit Palpable, without bruit  Aorta Not palpable N/A  Femoral Palpable Palpable  Popliteal Not palpable Not palpable  PT Not Palpable Faintly Palpable  DP Faintly Palpable Faintly Palpable   Gastrointestinal: soft, NTND, no G/R, no HSM, no masses, no CVAT B, small perimedian hernia, heal incision  Musculoskeletal: M/S 5/5 throughout , Extremities without ischemic changes , B lower leg dependent rubor, some cyanotic toes, edema B 1-2+, varicosities evident, well healed thigh incision, well healed R ray amp  Neurologic: Pain and light touch intact in extremities , Motor exam as listed above   Non-Invasive Vascular Imaging ABI (Date: 01/15/2016)  R: 0.89, DP: tri, PT: tri, TBI: amputated  L: 1.12, DP: tri, PT: tri, TBI: 0.64   Medical Decision Making  [name] is a 60 y.o. male who presents with: s/p excision of R SFA mycotic aneurysm, ligation of femoral vein after thrombosis and rupture, rectus abdominus muscle flap to right groin, R 1st & 2nd ray amp   Despite losing his R SFA due to the mycotic aneurysm, the patient has collateralized extensively from  the PFA, reconstituting essential bi-triphasic flow in the distal SFA/pop.  The venous flow is also collateralizing as his swelling in his right leg is greatly improved.  In this patient, his GSV should NEVER BE HARVESTED OR ABLATED, as he might be at risk for venous gangrene if this occurs as his femoral vein was also digested by the mycotic aneurysm.  Based on the patient's vascular studies and examination, I have offered the patient: q6 month ABI.  I discussed in depth with the patient the nature of atherosclerosis, and emphasized the importance of maximal medical management including strict control of blood pressure, blood glucose, and lipid levels, antiplatelet agents, obtaining regular exercise, and cessation of smoking.    The patient is aware that without maximal medical management the underlying atherosclerotic disease process will progress, limiting  the benefit of any interventions. The patient is currently on a statin: Lipitor. The patient is currently not on an anti-platelet: as on Eliquis.  Thank you for allowing [place] to participate in this patient's care.   Leonides Sake, MD, FACS [facility] Office: [phone] Pager: [phone]

## 2016-01-16 ENCOUNTER — Ambulatory Visit (HOSPITAL_COMMUNITY)
Admission: RE | Admit: 2016-01-16 | Discharge: 2016-01-16 | Disposition: A | Discharge status: Home / Self Care | Payer: Medicaid Other | Source: Ambulatory Visit | Attending: Vascular Surgery | Admitting: Vascular Surgery

## 2016-01-16 DIAGNOSIS — I10 Essential (primary) hypertension: Secondary | ICD-10-CM | POA: Diagnosis not present

## 2016-01-16 DIAGNOSIS — E785 Hyperlipidemia, unspecified: Secondary | ICD-10-CM | POA: Diagnosis not present

## 2016-01-16 DIAGNOSIS — R0989 Other specified symptoms and signs involving the circulatory and respiratory systems: Secondary | ICD-10-CM | POA: Diagnosis present

## 2016-01-16 DIAGNOSIS — I330 Acute and subacute infective endocarditis: Secondary | ICD-10-CM | POA: Diagnosis not present

## 2016-01-16 DIAGNOSIS — F419 Anxiety disorder, unspecified: Secondary | ICD-10-CM | POA: Insufficient documentation

## 2016-01-16 DIAGNOSIS — R938 Abnormal findings on diagnostic imaging of other specified body structures: Secondary | ICD-10-CM | POA: Diagnosis not present

## 2016-01-16 DIAGNOSIS — I724 Aneurysm of artery of lower extremity: Secondary | ICD-10-CM | POA: Diagnosis not present

## 2016-01-16 DIAGNOSIS — K219 Gastro-esophageal reflux disease without esophagitis: Secondary | ICD-10-CM | POA: Insufficient documentation

## 2016-01-16 DIAGNOSIS — F329 Major depressive disorder, single episode, unspecified: Secondary | ICD-10-CM | POA: Diagnosis not present

## 2016-01-16 DIAGNOSIS — I729 Aneurysm of unspecified site: Secondary | ICD-10-CM

## 2016-01-17 ENCOUNTER — Encounter: Payer: Self-pay | Admitting: Vascular Surgery

## 2016-01-17 ENCOUNTER — Ambulatory Visit (INDEPENDENT_AMBULATORY_CARE_PROVIDER_SITE_OTHER): Payer: Medicaid Other | Admitting: Vascular Surgery

## 2016-01-17 VITALS — BP 126/90 | HR 102 | Temp 97.4°F | Resp 18 | Ht 75.0 in | Wt 272.0 lb

## 2016-01-17 DIAGNOSIS — I724 Aneurysm of artery of lower extremity: Secondary | ICD-10-CM | POA: Diagnosis not present

## 2016-01-17 DIAGNOSIS — I872 Venous insufficiency (chronic) (peripheral): Secondary | ICD-10-CM | POA: Insufficient documentation

## 2016-01-17 DIAGNOSIS — I330 Acute and subacute infective endocarditis: Secondary | ICD-10-CM | POA: Diagnosis not present

## 2016-01-17 DIAGNOSIS — I729 Aneurysm of unspecified site: Secondary | ICD-10-CM

## 2016-01-27 ENCOUNTER — Emergency Department (HOSPITAL_COMMUNITY): Payer: Medicaid Other

## 2016-01-27 ENCOUNTER — Encounter (HOSPITAL_COMMUNITY): Payer: Self-pay | Admitting: Neurology

## 2016-01-27 ENCOUNTER — Inpatient Hospital Stay (HOSPITAL_COMMUNITY)
Admission: EM | Admit: 2016-01-27 | Discharge: 2016-01-30 | DRG: 176 | Disposition: A | Discharge status: Home / Self Care | Payer: Medicaid Other | Attending: Internal Medicine | Admitting: Internal Medicine

## 2016-01-27 DIAGNOSIS — Z7901 Long term (current) use of anticoagulants: Secondary | ICD-10-CM | POA: Diagnosis not present

## 2016-01-27 DIAGNOSIS — Z9119 Patient's noncompliance with other medical treatment and regimen: Secondary | ICD-10-CM | POA: Diagnosis not present

## 2016-01-27 DIAGNOSIS — I10 Essential (primary) hypertension: Secondary | ICD-10-CM | POA: Diagnosis present

## 2016-01-27 DIAGNOSIS — E785 Hyperlipidemia, unspecified: Secondary | ICD-10-CM | POA: Diagnosis present

## 2016-01-27 DIAGNOSIS — R0602 Shortness of breath: Secondary | ICD-10-CM

## 2016-01-27 DIAGNOSIS — Z86718 Personal history of other venous thrombosis and embolism: Secondary | ICD-10-CM | POA: Diagnosis not present

## 2016-01-27 DIAGNOSIS — K219 Gastro-esophageal reflux disease without esophagitis: Secondary | ICD-10-CM | POA: Diagnosis present

## 2016-01-27 DIAGNOSIS — Z8249 Family history of ischemic heart disease and other diseases of the circulatory system: Secondary | ICD-10-CM

## 2016-01-27 DIAGNOSIS — I5032 Chronic diastolic (congestive) heart failure: Secondary | ICD-10-CM | POA: Diagnosis present

## 2016-01-27 DIAGNOSIS — Z87891 Personal history of nicotine dependence: Secondary | ICD-10-CM

## 2016-01-27 DIAGNOSIS — I480 Paroxysmal atrial fibrillation: Secondary | ICD-10-CM | POA: Diagnosis present

## 2016-01-27 DIAGNOSIS — Z9114 Patient's other noncompliance with medication regimen: Secondary | ICD-10-CM

## 2016-01-27 DIAGNOSIS — R079 Chest pain, unspecified: Secondary | ICD-10-CM | POA: Diagnosis present

## 2016-01-27 DIAGNOSIS — Z79899 Other long term (current) drug therapy: Secondary | ICD-10-CM

## 2016-01-27 DIAGNOSIS — G894 Chronic pain syndrome: Secondary | ICD-10-CM | POA: Diagnosis present

## 2016-01-27 DIAGNOSIS — Z823 Family history of stroke: Secondary | ICD-10-CM

## 2016-01-27 DIAGNOSIS — Z888 Allergy status to other drugs, medicaments and biological substances status: Secondary | ICD-10-CM | POA: Diagnosis not present

## 2016-01-27 DIAGNOSIS — F329 Major depressive disorder, single episode, unspecified: Secondary | ICD-10-CM | POA: Diagnosis present

## 2016-01-27 DIAGNOSIS — I739 Peripheral vascular disease, unspecified: Secondary | ICD-10-CM | POA: Diagnosis present

## 2016-01-27 DIAGNOSIS — I471 Supraventricular tachycardia: Secondary | ICD-10-CM | POA: Diagnosis present

## 2016-01-27 DIAGNOSIS — I440 Atrioventricular block, first degree: Secondary | ICD-10-CM | POA: Diagnosis present

## 2016-01-27 DIAGNOSIS — I2699 Other pulmonary embolism without acute cor pulmonale: Secondary | ICD-10-CM | POA: Diagnosis present

## 2016-01-27 DIAGNOSIS — Z79891 Long term (current) use of opiate analgesic: Secondary | ICD-10-CM | POA: Diagnosis not present

## 2016-01-27 DIAGNOSIS — Z96653 Presence of artificial knee joint, bilateral: Secondary | ICD-10-CM | POA: Diagnosis present

## 2016-01-27 DIAGNOSIS — I872 Venous insufficiency (chronic) (peripheral): Secondary | ICD-10-CM | POA: Diagnosis present

## 2016-01-27 DIAGNOSIS — Z86711 Personal history of pulmonary embolism: Secondary | ICD-10-CM

## 2016-01-27 DIAGNOSIS — M170 Bilateral primary osteoarthritis of knee: Secondary | ICD-10-CM | POA: Diagnosis present

## 2016-01-27 DIAGNOSIS — I110 Hypertensive heart disease with heart failure: Secondary | ICD-10-CM | POA: Diagnosis present

## 2016-01-27 DIAGNOSIS — I499 Cardiac arrhythmia, unspecified: Secondary | ICD-10-CM | POA: Diagnosis not present

## 2016-01-27 DIAGNOSIS — F419 Anxiety disorder, unspecified: Secondary | ICD-10-CM | POA: Diagnosis present

## 2016-01-27 LAB — CBC
HEMATOCRIT: 47.1 % (ref 39.0–52.0)
HEMOGLOBIN: 15.2 g/dL (ref 13.0–17.0)
MCH: 29.6 pg (ref 26.0–34.0)
MCHC: 32.3 g/dL (ref 30.0–36.0)
MCV: 91.6 fL (ref 78.0–100.0)
Platelets: 255 10*3/uL (ref 150–400)
RBC: 5.14 MIL/uL (ref 4.22–5.81)
RDW: 14.8 % (ref 11.5–15.5)
WBC: 8.3 10*3/uL (ref 4.0–10.5)

## 2016-01-27 LAB — BASIC METABOLIC PANEL
ANION GAP: 8 (ref 5–15)
BUN: 17 mg/dL (ref 6–20)
CO2: 18 mmol/L — ABNORMAL LOW (ref 22–32)
Calcium: 9.5 mg/dL (ref 8.9–10.3)
Chloride: 112 mmol/L — ABNORMAL HIGH (ref 101–111)
Creatinine, Ser: 0.92 mg/dL (ref 0.61–1.24)
GFR calc Af Amer: >60 mL/min (ref >60)
GLUCOSE: 88 mg/dL (ref 65–99)
POTASSIUM: 4.5 mmol/L (ref 3.5–5.1)
Sodium: 138 mmol/L (ref 135–145)

## 2016-01-27 LAB — HEPARIN LEVEL (UNFRACTIONATED): HEPARIN UNFRACTIONATED: 0.62 [IU]/mL (ref 0.30–0.70)

## 2016-01-27 LAB — MRSA PCR SCREENING: MRSA by PCR: NEGATIVE

## 2016-01-27 LAB — APTT
APTT: 32 s (ref 24–37)
APTT: 127 s — AB (ref 24–37)

## 2016-01-27 LAB — I-STAT TROPONIN, ED: Troponin i, poc: 0 ng/mL (ref 0.00–0.08)

## 2016-01-27 LAB — BRAIN NATRIURETIC PEPTIDE: B Natriuretic Peptide: 9.1 pg/mL (ref 0.0–100.0)

## 2016-01-27 LAB — TROPONIN I: Troponin I: <0.03 ng/mL (ref <0.03)

## 2016-01-27 MED ORDER — OXYCODONE HCL 5 MG PO TABS
30.0000 mg | ORAL_TABLET | Freq: Four times a day (QID) | ORAL | Status: DC
  Administered 2016-01-27 – 2016-01-30 (×11): 30 mg via ORAL
  Filled 2016-01-27 (×11): qty 6

## 2016-01-27 MED ORDER — HEPARIN (PORCINE) IN NACL 100-0.45 UNIT/ML-% IJ SOLN
1750.0000 [IU]/h | INTRAMUSCULAR | Status: DC
  Administered 2016-01-27: 1800 [IU]/h via INTRAVENOUS
  Administered 2016-01-28: 1750 [IU]/h via INTRAVENOUS
  Administered 2016-01-28: 1800 [IU]/h via INTRAVENOUS
  Administered 2016-01-29: 1750 [IU]/h via INTRAVENOUS
  Filled 2016-01-27 (×4): qty 250

## 2016-01-27 MED ORDER — TESTOSTERONE CYPIONATE 200 MG/ML IM SOLN: 100.0000 mg | INTRAMUSCULAR | Status: DC

## 2016-01-27 MED ORDER — MORPHINE SULFATE ER 15 MG PO TBCR
90.0000 mg | EXTENDED_RELEASE_TABLET | Freq: Two times a day (BID) | ORAL | Status: DC
  Administered 2016-01-27 – 2016-01-30 (×7): 90 mg via ORAL
  Filled 2016-01-27 (×7): qty 6

## 2016-01-27 MED ORDER — ALBUTEROL SULFATE (2.5 MG/3ML) 0.083% IN NEBU: 2.5000 mg | INHALATION_SOLUTION | Freq: Four times a day (QID) | RESPIRATORY_TRACT | Status: DC | PRN

## 2016-01-27 MED ORDER — TRIFLURIDINE 1 % OP SOLN
1.0000 [drp] | Freq: Two times a day (BID) | OPHTHALMIC | Status: DC | PRN
  Filled 2016-01-27: qty 7.5

## 2016-01-27 MED ORDER — FLECAINIDE ACETATE 100 MG PO TABS
100.0000 mg | ORAL_TABLET | Freq: Two times a day (BID) | ORAL | Status: DC
  Administered 2016-01-27 – 2016-01-30 (×6): 100 mg via ORAL
  Filled 2016-01-27 (×6): qty 1

## 2016-01-27 MED ORDER — HEPARIN BOLUS VIA INFUSION
7000.0000 [IU] | Freq: Once | INTRAVENOUS | Status: AC
  Administered 2016-01-27: 7000 [IU] via INTRAVENOUS
  Filled 2016-01-27: qty 7000

## 2016-01-27 MED ORDER — ONDANSETRON HCL 4 MG PO TABS: 8.0000 mg | ORAL_TABLET | Freq: Three times a day (TID) | ORAL | Status: DC | PRN

## 2016-01-27 MED ORDER — ONDANSETRON HCL 4 MG/2ML IJ SOLN
4.0000 mg | Freq: Once | INTRAMUSCULAR | Status: AC
  Administered 2016-01-27: 4 mg via INTRAVENOUS
  Filled 2016-01-27: qty 2

## 2016-01-27 MED ORDER — ATENOLOL 100 MG PO TABS
100.0000 mg | ORAL_TABLET | Freq: Two times a day (BID) | ORAL | Status: DC
  Administered 2016-01-27 – 2016-01-30 (×6): 100 mg via ORAL
  Filled 2016-01-27 (×6): qty 1

## 2016-01-27 MED ORDER — ACETAMINOPHEN 325 MG PO TABS: 650.0000 mg | ORAL_TABLET | Freq: Four times a day (QID) | ORAL | Status: DC | PRN

## 2016-01-27 MED ORDER — ALBUTEROL SULFATE HFA 108 (90 BASE) MCG/ACT IN AERS: 1.0000 | INHALATION_SPRAY | Freq: Four times a day (QID) | RESPIRATORY_TRACT | Status: DC | PRN

## 2016-01-27 MED ORDER — GABAPENTIN 100 MG PO CAPS: 100.0000 mg | ORAL_CAPSULE | Freq: Three times a day (TID) | ORAL | Status: DC

## 2016-01-27 MED ORDER — GABAPENTIN 100 MG PO CAPS
100.0000 mg | ORAL_CAPSULE | Freq: Three times a day (TID) | ORAL | Status: DC
  Administered 2016-01-27 – 2016-01-30 (×8): 100 mg via ORAL
  Filled 2016-01-27 (×8): qty 1

## 2016-01-27 MED ORDER — IOPAMIDOL (ISOVUE-370) INJECTION 76%
INTRAVENOUS | Status: AC
  Administered 2016-01-27: 80 mL via INTRAVENOUS
  Filled 2016-01-27: qty 100

## 2016-01-27 MED ORDER — ACETAMINOPHEN 650 MG RE SUPP: 650.0000 mg | Freq: Four times a day (QID) | RECTAL | Status: DC | PRN

## 2016-01-27 MED ORDER — POLYETHYLENE GLYCOL 3350 17 G PO PACK: 17.0000 g | PACK | Freq: Every day | ORAL | Status: DC | PRN

## 2016-01-27 MED ORDER — SUMATRIPTAN SUCCINATE 50 MG PO TABS: 50.0000 mg | ORAL_TABLET | Freq: Every day | ORAL | Status: DC | PRN

## 2016-01-27 MED ORDER — TEMAZEPAM 15 MG PO CAPS
30.0000 mg | ORAL_CAPSULE | Freq: Every day | ORAL | Status: DC
  Administered 2016-01-27 – 2016-01-29 (×3): 30 mg via ORAL
  Filled 2016-01-27 (×3): qty 2

## 2016-01-27 MED ORDER — VALACYCLOVIR HCL 500 MG PO TABS: 1000.0000 mg | ORAL_TABLET | Freq: Every day | ORAL | Status: DC

## 2016-01-27 MED ORDER — FLECAINIDE ACETATE 100 MG PO TABS
100.0000 mg | ORAL_TABLET | Freq: Two times a day (BID) | ORAL | Status: DC
  Filled 2016-01-27 (×2): qty 1

## 2016-01-27 MED ORDER — VENLAFAXINE HCL ER 75 MG PO CP24
75.0000 mg | ORAL_CAPSULE | Freq: Two times a day (BID) | ORAL | Status: DC
  Administered 2016-01-27 – 2016-01-30 (×6): 75 mg via ORAL
  Filled 2016-01-27 (×6): qty 1

## 2016-01-27 MED ORDER — ATORVASTATIN CALCIUM 80 MG PO TABS
80.0000 mg | ORAL_TABLET | Freq: Every day | ORAL | Status: DC
  Administered 2016-01-27 – 2016-01-30 (×4): 80 mg via ORAL
  Filled 2016-01-27 (×4): qty 1

## 2016-01-27 MED ORDER — NALOXEGOL OXALATE 25 MG PO TABS
25.0000 mg | ORAL_TABLET | Freq: Every day | ORAL | Status: DC | PRN
  Administered 2016-01-30: 25 mg via ORAL
  Filled 2016-01-27: qty 1

## 2016-01-27 MED ORDER — OXYCODONE HCL 5 MG PO TABS
30.0000 mg | ORAL_TABLET | Freq: Once | ORAL | Status: AC
  Administered 2016-01-27: 30 mg via ORAL
  Filled 2016-01-27: qty 6

## 2016-01-27 MED ORDER — MORPHINE SULFATE (PF) 4 MG/ML IV SOLN
4.0000 mg | Freq: Once | INTRAVENOUS | Status: AC
  Administered 2016-01-27: 4 mg via INTRAVENOUS
  Filled 2016-01-27: qty 1

## 2016-01-27 NOTE — Progress Notes (Signed)
ANTICOAGULATION CONSULT NOTE - Initial Consult  Pharmacy Consult for heparin Indication: pulmonary embolus  Allergies  Allergen Reactions  . Verapamil Other (See Comments)    Causes pvc's  . Imitrex [Sumatriptan] Other (See Comments)    Chest pain, 2004 (tolerates 50 mg prn)  . Duloxetine Hcl     Psychosis    Patient Measurements: Height:  (190.5 cm) Weight: 266 lb 1.5 oz (120.7 kg) IBW/kg (Calculated) : 84.5Ideal Weight: 84.5 kg Heparin Dosing Weight: 111 kg Adjusted Weight: 100.1 kg  Vital Signs: Temp: 98.2 F (36.8 C) (07/03 1934) Temp Source: Axillary (07/03 1934) BP: 127/88 mmHg (07/03 1934) Pulse Rate: 94 (07/03 1934)  Labs:  Recent Labs  01/27/16 1040 01/27/16 1423 01/27/16 1815 01/27/16 2117  HGB 15.2  --   --   --   HCT 47.1  --   --   --   PLT 255  --   --   --   APTT  --  32  --  127*  HEPARINUNFRC  --  <0.10*  --  0.62  CREATININE 0.92  --   --   --   TROPONINI  --   --  <0.03  --     Estimated Creatinine Clearance: 124 mL/min (by C-G formula based on Cr of 0.92).   Medical History: Past Medical History  Diagnosis Date  . Hypertension   . Chronic knee pain     "both sides"  . Hyperlipemia   . PUD (peptic ulcer disease)   . Peripheral vascular disease (HCC)   . Anxiety   . GERD (gastroesophageal reflux disease)   . 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 Atrial fibrillation (HCC) dx'd 04/2015  . Chronic bronchitis (HCC)     "used to get it q yr; nothing in the last ~ 5 yrs" (08/15/2015)  . Migraine     "q 3 months" (08/15/2015)  . Osteoarthritis of both knees   . Depression   . Chronic left shoulder pain     Medications:  Prescriptions prior to admission  Medication Sig Dispense Refill Last Dose  . albuterol (PROAIR HFA) 108 (90 BASE) MCG/ACT inhaler Inhale 1 puff into the lungs every 6 (six) hours as needed for wheezing or shortness of breath.    Past Week at Unknown time  .  albuterol (PROAIR HFA) 108 (90 Base) MCG/ACT inhaler Inhale 1-2 puffs into the lungs every 6 (six) hours as needed. wheezing   rescue at rescue  . apixaban (ELIQUIS) 5 MG TABS tablet Take 1 tablet (5 mg total) by mouth 2 (two) times daily. 60 tablet 0 01/26/2016 at 0800  . atenolol (TENORMIN) 100 MG tablet Take 100 mg by mouth 2 (two) times daily.   01/27/2016 at 0800  . atorvastatin (LIPITOR) 80 MG tablet Take 1 tablet (80 mg total) by mouth daily. 30 tablet 1 01/27/2016 at Unknown time  . famciclovir (FAMVIR) 500 MG tablet Take 500 mg by mouth 2 (two) times daily as needed. ulcers   Past Month at Unknown time  . flecainide (TAMBOCOR) 100 MG tablet Take 1 tablet (100 mg total) by mouth every 12 (twelve) hours. 60 tablet 1 01/26/2016 at Unknown time  . gabapentin (NEURONTIN) 100 MG capsule Take 1 capsule (100 mg total) by mouth 3 (three) times daily. 90 capsule 1 01/26/2016 at Unknown time  . morphine (MS CONTIN) 30 MG 12 hr tablet Take 3 tablets (90 mg total) by mouth every 12 (  twelve) hours. 90 tablet 0 01/27/2016 at Unknown time  . morphine (MS CONTIN) 60 MG 12 hr tablet Take 60 mg by mouth 2 (two) times daily.  0 01/26/2016 at Unknown time  . Multiple Vitamin (MULTIVITAMIN WITH MINERALS) TABS tablet Take 1 tablet by mouth daily.   Past Week at Unknown time  . naloxegol oxalate (MOVANTIK) 25 MG TABS tablet Take 1 tablet (25 mg total) by mouth daily before breakfast. (Patient taking differently: Take 25 mg by mouth daily as needed (open induced constipation). ) 30 tablet 0 01/27/2016 at Unknown time  . ondansetron (ZOFRAN) 8 MG tablet Take 8 mg by mouth every 8 (eight) hours as needed. nausea   Past Month at Unknown time  . oxyCODONE (ROXICODONE) 30 MG immediate release tablet Take 1 tablet (30 mg total) by mouth every 4 (four) hours as needed for moderate pain. (Patient taking differently: Take 30 mg by mouth every 6 (six) hours. ) 90 tablet 0 01/27/2016 at Unknown time  . polyethylene glycol (MIRALAX / GLYCOLAX)  packet Take 17 g by mouth daily. (Patient taking differently: Take 17 g by mouth daily as needed for mild constipation. ) 100 each 0 Past Month at Unknown time  . SUMAtriptan (IMITREX) 50 MG tablet Take 50 mg by mouth daily as needed for migraine or headache. Maximum 2 doses in 2 days  0 Past Month at Unknown time  . temazepam (RESTORIL) 30 MG capsule Take 30 mg by mouth at bedtime.  0 01/26/2016 at Unknown time  . testosterone cypionate (DEPOTESTOSTERONE CYPIONATE) 200 MG/ML injection Inject 100 mg into the muscle every 14 (fourteen) days. Reported on 08/05/2015  0 Past Month at Unknown time  . trifluridine (VIROPTIC) 1 % ophthalmic solution Place 1 drop into both eyes 2 (two) times daily as needed (breakouts (blurry, matted eyelids)).   Past Month at Unknown time  . venlafaxine XR (EFFEXOR-XR) 75 MG 24 hr capsule Take 1 capsule (75 mg total) by mouth daily with breakfast. (Patient taking differently: Take 75 mg by mouth 2 (two) times daily. ) 30 capsule 1 01/27/2016 at Unknown time  . docusate sodium (COLACE) 100 MG capsule Take 1 capsule (100 mg total) by mouth 2 (two) times daily. (Patient not taking: Reported on 01/17/2016) 10 capsule 0 Not Taking at Unknown time  . DULoxetine (CYMBALTA) 30 MG capsule Take 2 capsules (60 mg total) by mouth daily at 12 noon. (Patient not taking: Reported on 01/17/2016) 30 capsule 0 Not Taking at Unknown time  . methocarbamol (ROBAXIN) 500 MG tablet Take 1 tablet (500 mg total) by mouth every 6 (six) hours as needed for muscle spasms. (Patient not taking: Reported on 01/27/2016) 90 tablet 0 Not Taking at Unknown time  . Metoprolol Tartrate 75 MG TABS Take 75 mg by mouth 2 (two) times daily. (Patient not taking: Reported on 01/27/2016) 60 tablet 1 Not Taking at 0800  . oxyCODONE-acetaminophen (PERCOCET) 5-325 MG tablet Take 1-2 tablets by mouth every 6 (six) hours as needed. (Patient not taking: Reported on 01/27/2016) 15 tablet 0 Completed Course at Unknown time    Assessment: 60  yo M presenting w/ acute onset of chest pain and SOB, also w/ leg swelling. Heparin per pharmacy for PE. On Eliquis PTA for afib/Hx DVT of which the patient reported missing the last few doses. Baseline HL and aPTT were not elevated - HL <0.10; aPTT 32 sec. Will therefore dose heparin based on heparin levels. HL this evening returned therapeutic at  0.62. CBC WNL.  No reported bleeding.  Goal of Therapy:  Heparin level 0.3-0.7 units/ml aPTT 66-102 seconds Monitor platelets by anticoagulation protocol: Yes   Plan:  -Continue heparin at 1800 units/hr -Daily HL and CBC -Monitor s/sx bleeding   Sherle Poeob Vincent, PharmD Clinical Pharmacist  10:30 PM, 01/27/2016

## 2016-01-27 NOTE — Progress Notes (Signed)
ANTICOAGULATION CONSULT NOTE - Initial Consult  Pharmacy Consult for heparin Indication: pulmonary embolus  Allergies  Allergen Reactions  . Verapamil Other (See Comments)    Causes pvc's  . Imitrex [Sumatriptan] Other (See Comments)    Chest pain, 2004 (tolerates 50 mg prn)  . Duloxetine Hcl     Psychosis    Patient Measurements:  Ideal Weight: 84.5 kg Heparin Dosing Weight: 111 kg Adjusted Weight: 100.1 kg  Vital Signs: Temp: 97.8 F (36.6 C) (07/03 1015) Temp Source: Oral (07/03 1015) BP: 131/93 mmHg (07/03 1345) Pulse Rate: 97 (07/03 1345)  Labs:  Recent Labs  01/27/16 1040  HGB 15.2  HCT 47.1  PLT 255  CREATININE 0.92    Estimated Creatinine Clearance: 125.4 mL/min (by C-G formula based on Cr of 0.92).   Medical History: Past Medical History  Diagnosis Date  . Hypertension   . Chronic knee pain     "both sides"  . Hyperlipemia   . PUD (peptic ulcer disease)   . Peripheral vascular disease (HCC)   . Anxiety   . GERD (gastroesophageal reflux disease)   . 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 Atrial fibrillation (HCC) dx'd 04/2015  . Chronic bronchitis (HCC)     "used to get it q yr; nothing in the last ~ 5 yrs" (08/15/2015)  . Migraine     "q 3 months" (08/15/2015)  . Osteoarthritis of both knees   . Depression   . Chronic left shoulder pain     Medications:   (Not in a hospital admission)  Assessment: 60 yo M presenting w/ acute onset of chest pain and SOB, also w/ leg swelling. Pt has a history of DVT and afib. Currently on Eliquis, but missed his dose this morning and last night (last dose 7/2 at ~0800).   Baseline aPTT and HL pending, Hgb 15.2, Plt 255.   Goal of Therapy:  Heparin level 0.3-0.7 units/ml aPTT 66-102 seconds Monitor platelets by anticoagulation protocol: Yes   Plan:  --STAT aPTT and HL --Give 7000 units heparin bolus x 1 --Start heparin infusion at 1800  units/hr --Check anti-Xa level and aPTT in 6 hours and daily while on heparin until correlating --Continue to monitor H&H and platelets --f[facility]  [place] JanskyMeagan Fama Muenchow, PharmD Clinical Pharmacist Pager: [phone] 01/27/2016,2:21 PM

## 2016-01-27 NOTE — ED Notes (Addendum)
Per ems- Pt was lying on the couch this morning when he had sudden onset CP, sob, nausea, diarrhea. EKG ST 104, given 1 nitro with improvement of CP from 9 to a 6, given 324 aspirin. Pt is a x 4. Central CP, SOB, non-radiating. Unable to get IV.

## 2016-01-27 NOTE — ED Notes (Signed)
Patient endorses that he is able to take a deep breath with minimal pain to the right chest.  Denies any sharp shooting pains. Overall pain is back to his normal with chronic pain and barely any pain in his chest.

## 2016-01-27 NOTE — ED Notes (Signed)
Report attempted x 1

## 2016-01-27 NOTE — H&P (Signed)
History and Physical    [name] ZOX:[id number] DOB: August 20, 1955 DOA: 01/27/2016  PCP: [name], MD [name] coming from: home  Chief Complaint: chest pain  HPI: [name] is a very pleasant 60 y.o. 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 Initial evaluation reveals pulmonary embolism.  Information is obtained from the [name]. He reports being in his usual state of health until this morning developed sudden onset chest pain. He describes the pain as sharp constant located substernal worse with inspiration. Pain is nonradiating. He denies shortness of breath diaphoresis nausea vomiting. He denies hemoptysis,  headache dizziness syncope or near-syncope. He denies lower extremity edema or orthopnea. He denies abdominal pain dysuria hematuria frequency or urgency. He denies diarrhea melena bright red blood per rectum. He denies any recent travel or sick contacts. He denies fever chills cough.    ED Course: In the emergency department he is provided with a heparin drip per pharmacy  Review of Systems: As per HPI otherwise 10 point review of systems negative.   Ambulatory Status: With a cane  Past Medical History  Diagnosis Date  . Hypertension   . Chronic knee pain     "both sides"  . Hyperlipemia   . PUD (peptic ulcer disease)   . Peripheral vascular disease (HCC)   . Anxiety   . GERD (gastroesophageal reflux disease)   . 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 Atrial fibrillation (HCC) dx'd 04/2015  . Chronic bronchitis (HCC)     "used to get it q yr; nothing in the last ~ 5 yrs" (08/15/2015)  . Migraine     "q 3  months" (08/15/2015)  . Osteoarthritis of both knees   . Depression   . Chronic left shoulder pain     Past Surgical History  Procedure Laterality Date  . I&d extremity Left 07/12/2013    Procedure: I&D Left Thigh Abscess;  Surgeon: [name], MD;  Location: [facility] OR;  Service: Orthopedics;  Laterality: Left;  . Replacement total knee bilateral Bilateral ~ 2007-2013"    "left-right"  . Shoulder arthroscopy w/ rotator cuff repair Bilateral 1992-1993    "right-left"  . Elbow arthroscopy with tendon reconstruction Right 1990  . 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 Incision and drainage abscess Right 04/11/2015    Procedure: INCISION AND DRAINAGE OF RIGHT THIGH ABSCESS;  Surgeon: Fransisco Hertz, MD;  Location: [facility] OR;  Service: Vascular;  Laterality: Right;  . Incision and drainage abscess Right 04/13/2015    Procedure: INCISION AND DRAINAGE THIGH ABSCESS;  Surgeon: Fransisco Hertz, MD;  Location: [facility] OR;  Service: Vascular;  Laterality: Right;  . I&d extremity Left 04/17/2015    Procedure: IRRIGATION AND DEBRIDEMENT LEFT EXTREMITY;  Surgeon: Fransisco Hertz, MD;  Location: [facility] OR;  Service: Vascular;  Laterality: Left;  Marland Kitchen Muscle flap closure Right 04/19/2015    Procedure: RIGHT RECTUS ABDOMINUS  FLAP TO RIGHT GROIN;  Surgeon: Glenna Fellows, MD;  Location: [facility] OR;  Service: Plastics;  Laterality: Right;  . Femoral revision Right 04/19/2015  Procedure: OVERSEW RIGHT FEMORAL VEIN ;  Surgeon: Chuck Hinthristopher S Dickson, MD;  [facility];  Service: Vascular;  Laterality: Right;  . Wound exploration Right 04/23/2015    Procedure: EXAM UNDER ANESTHESIA AND VAC CHANGE RIGHT THIGH;  Surgeon: Glenna FellowsBrinda Thimmappa, MD;  Location: [facility] OR;  Service: Plastics;  Laterality: Right;  . Amputation Right 05/17/2015    Procedure: Right 1st and 2nd Toe Amputation;   Surgeon: Nadara MustardMarcus Duda V, MD;  [facility];  Service: Orthopedics;  Laterality: Right;  . I&d extremity Left 07/09/2015    Procedure: IRRIGATION AND DEBRIDEMENT LEFT WRIST;  Surgeon: Knute NeuHarrill Coley, MD;  Location: [facility] OR;  Service: Plastics;  Laterality: Left;  . I&d extremity Left 08/08/2015    Procedure: IRRIGATION AND DEBRIDEMENT EXTREMITY;  Surgeon: Knute NeuHarrill Coley, MD;  Location: [facility] OR;  Service: Plastics;  Laterality: Left;  . Tonsillectomy  ~ 1972  . Appendectomy  ~ 1980  . Cholecystectomy  ~ 1982  . Inguinal hernia repair Right ~ 1969  . Hemorrhoidectomy with hemorrhoid banding  ~ 1985  . Knee arthroscopy Bilateral 1985-2008    "left-right"  . Joint replacement    . Uvulopalatoplasty  1991  . Cardiac catheterization  1993    "Dr. SwazilandJordan; for vasospasms"  . Orif tibia fracture Left 08/17/2015    Procedure: OPEN REDUCTION INTERNAL FIXATION (ORIF) TIBIA FRACTURE;  Surgeon: Kathryne Hitchhristopher Y Blackman, MD;  Location: [facility] OR;  Service: Orthopedics;  Laterality: Left;    Social History   Social History  . Marital Status: Married    Spouse Name: N/A  . Number of Children: 3  . Years of Education: N/A   Occupational History  .      Sales   Social History Main Topics  . Smoking status: Former Smoker -- 1.00 packs/day for 3 years    Types: Cigarettes    Quit date: 01/17/1971  . Smokeless tobacco: Never Used     Comment: "quit smoking cigarettes in 1979  . Alcohol Use: No  . Drug Use: No  . Sexual Activity: Yes   Other Topics Concern  . Not on file   Social History Narrative    Allergies  Allergen Reactions  . Verapamil Other (See Comments)    Causes pvc's  . Imitrex [Sumatriptan] Other (See Comments)    Chest pain, 2004 (tolerates 50 mg prn)  . Duloxetine Hcl     Psychosis    Family History  Problem Relation Age of Onset  . Heart attack Paternal Grandmother [id number]  . Heart disease Mother [id number]    arrythmia  . Stroke Paternal Grandfather   . Stroke Maternal Grandfather      Prior to Admission medications   Medication Sig Start Date End Date Taking? Authorizing Provider  albuterol (PROAIR HFA) 108 (90 BASE) MCG/ACT inhaler Inhale 1 puff into the lungs every 6 (six) hours as needed for wheezing or shortness of breath.  09/06/13  Yes Historical Provider, MD  albuterol (PROAIR HFA) 108 (90 Base) MCG/ACT inhaler Inhale 1-2 puffs into the lungs every 6 (six) hours as needed. wheezing 09/06/13  Yes Historical Provider, MD  apixaban (ELIQUIS) 5 MG TABS tablet Take 1 tablet (5 mg total) by mouth 2 (two) times daily. 08/30/15  Yes Daniel J Angiulli, PA-C  atenolol (TENORMIN) 100 MG tablet Take 100 mg by mouth 2 (two) times daily.   Yes Historical Provider, MD  atorvastatin (LIPITOR) 80 MG tablet Take 1 tablet (80 mg total) by mouth daily. 08/30/15  Yes Mcarthur Rossettianiel J  Angiulli, PA-C  famciclovir (FAMVIR) 500 MG tablet Take 500 mg by mouth 2 (two) times daily as needed. ulcers 10/25/13  Yes Historical Provider, MD  flecainide (TAMBOCOR) 100 MG tablet Take 1 tablet (100 mg total) by mouth every 12 (twelve) hours. 08/30/15  Yes Daniel J Angiulli, PA-C  gabapentin (NEURONTIN) 100 MG capsule Take 1 capsule (100 mg total) by mouth 3 (three) times daily. 08/30/15  Yes Daniel J Angiulli, PA-C  morphine (MS CONTIN) 30 MG 12 hr tablet Take 3 tablets (90 mg total) by mouth every 12 (twelve) hours. 08/30/15  Yes Daniel J Angiulli, PA-C  morphine (MS CONTIN) 60 MG 12 hr tablet Take 60 mg by mouth 2 (two) times daily. 01/11/16  Yes Historical Provider, MD  Multiple Vitamin (MULTIVITAMIN WITH MINERALS) TABS tablet Take 1 tablet by mouth daily. 05/24/15  Yes Daniel J Angiulli, PA-C  naloxegol oxalate (MOVANTIK) 25 MG TABS tablet Take 1 tablet (25 mg total) by mouth daily before breakfast. [name] taking differently: Take 25 mg by mouth daily as needed (open induced constipation).  05/24/15  Yes Daniel J Angiulli, PA-C  ondansetron (ZOFRAN) 8 MG tablet Take 8 mg by mouth every 8 (eight) hours as needed. nausea  12/18/15  Yes Historical Provider, MD  oxyCODONE (ROXICODONE) 30 MG immediate release tablet Take 1 tablet (30 mg total) by mouth every 4 (four) hours as needed for moderate pain. [name] taking differently: Take 30 mg by mouth every 6 (six) hours.  08/30/15  Yes Daniel J Angiulli, PA-C  polyethylene glycol (MIRALAX / GLYCOLAX) packet Take 17 g by mouth daily. [name] taking differently: Take 17 g by mouth daily as needed for mild constipation.  08/09/15  Yes Shanker Levora DredgeM Ghimire, MD  SUMAtriptan (IMITREX) 50 MG tablet Take 50 mg by mouth daily as needed for migraine or headache. Maximum 2 doses in 2 days 02/15/15  Yes Historical Provider, MD  temazepam (RESTORIL) 30 MG capsule Take 30 mg by mouth at bedtime. 01/24/16  Yes Historical Provider, MD  testosterone cypionate (DEPOTESTOSTERONE CYPIONATE) 200 MG/ML injection Inject 100 mg into the muscle every 14 (fourteen) days. Reported on 08/05/2015 06/25/15  Yes Historical Provider, MD  trifluridine (VIROPTIC) 1 % ophthalmic solution Place 1 drop into both eyes 2 (two) times daily as needed (breakouts (blurry, matted eyelids)).   Yes Historical Provider, MD  venlafaxine XR (EFFEXOR-XR) 75 MG 24 hr capsule Take 1 capsule (75 mg total) by mouth daily with breakfast. [name] taking differently: Take 75 mg by mouth 2 (two) times daily.  08/30/15  Yes Daniel J Angiulli, PA-C  docusate sodium (COLACE) 100 MG capsule Take 1 capsule (100 mg total) by mouth 2 (two) times daily. [name] not taking: Reported on 01/17/2016 05/24/15   Mcarthur Rossettianiel J Angiulli, PA-C  DULoxetine (CYMBALTA) 30 MG capsule Take 2 capsules (60 mg total) by mouth daily at 12 noon. [name] not taking: Reported on 01/17/2016 08/30/15   Mcarthur Rossettianiel J Angiulli, PA-C  methocarbamol (ROBAXIN) 500 MG tablet Take 1 tablet (500 mg total) by mouth every 6 (six) hours as needed for muscle spasms. [name] not taking: Reported on 01/27/2016 08/30/15   Mcarthur Rossettianiel J Angiulli, PA-C  Metoprolol Tartrate 75 MG TABS Take 75 mg by mouth 2 (two)  times daily. [name] not taking: Reported on 01/27/2016 08/30/15   Mcarthur Rossettianiel J Angiulli, PA-C  oxyCODONE-acetaminophen (PERCOCET) 5-325 MG tablet Take 1-2 tablets by mouth every 6 (six) hours as needed. [name] not taking: Reported on 01/27/2016 12/02/15   Geoffery Lyonsouglas Delo, MD  Physical Exam: Filed Vitals:   01/27/16 1430 01/27/16 1500 01/27/16 1515 01/27/16 1530  BP: 129/91 129/93 139/94 125/85  Pulse: 86 87 89 94  Temp:      TempSrc:      Resp: 10 13 18 22   SpO2: 100% 98% 98% 96%     General:  Appears calm and comfortable Eyes:  PERRL, EOMI, normal lids, iris ENT:  grossly normal hearing, lips & tongue, Mucous membranes of his mouth are pink and moist Neck:  no LAD, masses or thyromegaly Cardiovascular:  Regular rate and rhythm no m/r/g. No LE edema.  Respiratory:  CTA bilaterally, no w/r/r. Normal respiratory effort. Abdomen:  soft, ntnd, obese, positive bowel sounds throughout, no guarding or rebounding Skin:  no rash or induration seen on limited exam Musculoskeletal:  grossly normal tone BUE/BLE, good ROM, no bony abnormality Psychiatric:  grossly normal mood and affect, speech fluent and appropriate, AOx3 Neurologic:  CN 2-12 grossly intact, moves all extremities in coordinated fashion, sensation intact  Labs on Admission: I have personally reviewed following labs and imaging studies  CBC:  Recent Labs Lab 01/27/16 1040  WBC 8.3  HGB 15.2  HCT 47.1  MCV 91.6  PLT 255   Basic Metabolic Panel:  Recent Labs Lab 01/27/16 1040  NA 138  K 4.5  CL 112*  CO2 18*  GLUCOSE 88  BUN 17  CREATININE 0.92  CALCIUM 9.5   GFR: Estimated Creatinine Clearance: 125.4 mL/min (by C-G formula based on Cr of 0.92). Liver Function Tests: No results for input(s): AST, ALT, ALKPHOS, BILITOT, PROT, ALBUMIN in the last 168 hours. No results for input(s): LIPASE, AMYLASE in the last 168 hours. No results for input(s): AMMONIA in the last 168 hours. Coagulation Profile: No results for  input(s): INR, PROTIME in the last 168 hours. Cardiac Enzymes: No results for input(s): CKTOTAL, CKMB, CKMBINDEX, TROPONINI in the last 168 hours. BNP (last 3 results) No results for input(s): PROBNP in the last 8760 hours. HbA1C: No results for input(s): HGBA1C in the last 72 hours. CBG: No results for input(s): GLUCAP in the last 168 hours. Lipid Profile: No results for input(s): CHOL, HDL, LDLCALC, TRIG, CHOLHDL, LDLDIRECT in the last 72 hours. Thyroid Function Tests: No results for input(s): TSH, T4TOTAL, FREET4, T3FREE, THYROIDAB in the last 72 hours. Anemia Panel: No results for input(s): VITAMINB12, FOLATE, FERRITIN, TIBC, IRON, RETICCTPCT in the last 72 hours. Urine analysis:    Component Value Date/Time   COLORURINE YELLOW 08/04/2015 1217   APPEARANCEUR CLEAR 08/04/2015 1217   LABSPEC 1.016 08/04/2015 1217   PHURINE 5.5 08/04/2015 1217   GLUCOSEU NEGATIVE 08/04/2015 1217   HGBUR MODERATE* 08/04/2015 1217   BILIRUBINUR NEGATIVE 08/04/2015 1217   KETONESUR NEGATIVE 08/04/2015 1217   PROTEINUR NEGATIVE 08/04/2015 1217   UROBILINOGEN 1.0 05/08/2015 1942   NITRITE NEGATIVE 08/04/2015 1217   LEUKOCYTESUR NEGATIVE 08/04/2015 1217    Creatinine Clearance: Estimated Creatinine Clearance: 125.4 mL/min (by C-G formula based on Cr of 0.92).  Sepsis Labs: @LABRCNTIP (procalcitonin:4,lacticidven:4) )No results found for this or any previous visit (from the past 240 hour(s)).   Radiological Exams on Admission: Dg Chest 2 View  01/27/2016  CLINICAL DATA:  Chest pain, shortness of Breath EXAM: CHEST  2 VIEW COMPARISON:  08/04/2015 FINDINGS: Cardiomediastinal silhouette is stable. No acute infiltrate or pleural effusion. No pulmonary edema. Bony thorax is unremarkable. IMPRESSION: No active cardiopulmonary disease. Electronically Signed   By: Natasha Mead M.D.   On: 01/27/2016 11:31   Ct Angio  Chest Pe W/cm &/or Wo Cm  01/27/2016  CLINICAL DATA:  Chest pain and shortness of breath since  this morning. History of pulmonary embolism. [name] on anticoagulant therapy. EXAM: CT ANGIOGRAPHY CHEST WITH CONTRAST TECHNIQUE: Multidetector CT imaging of the chest was performed using the standard protocol during bolus administration of intravenous contrast. Multiplanar CT image reconstructions and MIPs were obtained to evaluate the vascular anatomy. CONTRAST:  80 mL Isovue 370 IV COMPARISON:  CT 02/25/2015 FINDINGS: Mediastinum/Lymph Nodes: Heart is normal size. Thoracic aorta is within normal. Pulmonary arteries are well opacified as there is a focal filling defect over a proximal right lower lobar pulmonary artery compatible with embolus. No left-sided emboli are visualized. RV/LV ratio is 49.6/50.0 = 0.99. Ratio is greater than 0.9 and compatible with mild right heart strain/ intermediate risk pulmonary embolism. No evidence of mediastinal or hilar adenopathy. Remaining mediastinal structures are within normal. Lungs/Pleura: No pulmonary mass, infiltrate, or effusion. Airways are normal. Upper abdomen: Stable left liver cyst. Musculoskeletal: Mild degenerate change of the spine. Review of the MIP images confirms the above findings. IMPRESSION: Small burden of pulmonary embolism over a right lower lobar pulmonary artery. Positive for acute PE with CT evidence of right heart strain (RV/LV Ratio = 0.99) consistent with at least submassive (intermediate risk) PE. The presence of right heart strain has been associated with an increased risk of morbidity and mortality. Please activate Code PE by paging [phone]. Left lobe liver cyst. Critical Value/emergent results were called by telephone at the time of interpretation on 01/27/2016 at 1:44 pm to Dr. Frederick Peers, who verbally acknowledged these results. Electronically Signed   By: Elberta Fortis M.D.   On: 01/27/2016 13:44    EKG: Independently reviewed. Sinus rhythm Prolonged PR interval Abnormal R-wave progression, early transition ST elevation,    Assessment/Plan Principal Problem:   PE (pulmonary embolism) Active Problems:   Chronic pain syndrome   Chronic use of opiate drugs therapeutic purposes   Chest pain   Essential hypertension   HLD (hyperlipidemia)   Chronic diastolic congestive heart failure (HCC)   Chronic venous insufficiency   Dysrhythmia   #1. PE. [name] with a history of DVT and peripheral vascular disease. CTA reveals small burden of pulmonary embolism over right lower lobar pulmonary artery with CT evidence of right heart strain consistent with at least submassive PE. Oxygen saturation level greater than 90% on room air. [name] is on Eliquis at home. Heparin drip initiated in the emergency department. Spoke to Dr Vassie Loll with pulmonology who recommended continuation of heparin drip. -Admit to step down -Continue heparin drip -obtain LE dopplar -obtain 2 d echo -bed rest with BR priveleges  #2. Hypertension. Fair control in the emergency department. Home medications include atenolol -continue atenolol -monitor closely  #3. Chronic diastolic heart failure. Compensated. Echo done September 2016 reveals EF of 55-60% and grade 1 diastolic dysfunction. [name] is on atenolol -obtain daily weights -Monitor intake and output -Continue atenolol  #4. Chest pain. Likely related to #1. Initial troponin negative. EKG noted above. -Cycle troponin -Serial EKG -Supportive therapy -see #1   #5. Dysrhythmia: [name] with a history of paroxysmal A. Fib and paroxysmal SVT. EKG as noted above. Recent presenting with chest pain. He is on eliquis. -See #4. -see #1  #6. Chronic venous insufficiency. Stable at baseline  7. Chronic pain syndrome. Appears to be stable at baseline. -We'll continue home pain management regimen -Monitor     DVT prophylaxis: heparin gtt Code Status: full  Family Communication:  none present  Disposition Plan: home  Consults called: spoke with Dr Vassie Loll  Admission status: inpatient     Gwenyth Bender MD [facility]  If 7PM-7AM, please contact night-coverage [email or web address] Password [email or web address]  01/27/2016, 4:02 PM

## 2016-01-27 NOTE — ED Notes (Addendum)
Let PA kelly know that pt difficult IV stick. Attempt x 2 this RN and EMS. Pt needs IV for CT angio. Verbal order for IM morphine. Will consult IV team

## 2016-01-27 NOTE — ED Provider Notes (Signed)
CSN: [id number]     Arrival date & time 01/27/16  1010 History   First MD Initiated Contact with Patient 01/27/16 1023     Chief Complaint  Patient presents with  . Chest Pain   HPI Comments: 60 year old male who presents with acute onset of chest pain and SOB. PMH significant for hypertension, hyperlipidemia, A. 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 He states the pain came on all of a sudden while he was lying on the couch at about 9 AM this morning. The pain is substernal radiates to the left side of his chest. Feels like a pressure. He states he has had chest pain before however is never been this severe. Nothing makes it better. Exertion makes it worse. He states he is compliant with his Eliquis. Cardiologist Dr. Jacinto Halim. Also complaining of some mild swelling in his ankles bilaterally.  Patient is a 60 y.o. male presenting with chest pain.  Chest Pain Associated symptoms: shortness of breath   Associated symptoms: no abdominal pain, no cough, no nausea, no palpitations and not vomiting     Past Medical History  Diagnosis Date  . Hypertension   . Chronic knee pain     "both sides"  . Hyperlipemia   . PUD (peptic ulcer disease)   . Peripheral vascular disease (HCC)   . Anxiety   . GERD (gastroesophageal reflux disease)   . 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 Atrial fibrillation (HCC) dx'd 04/2015  . Chronic bronchitis (HCC)     "used to get it q yr; nothing in the last ~ 5 yrs" (08/15/2015)  . Migraine     "q 3 months" (08/15/2015)  . Osteoarthritis of both knees   . Depression   . Chronic left shoulder pain    Past Surgical History  Procedure Laterality Date  . I&d extremity Left 07/12/2013    Procedure: I&D Left Thigh Abscess;  Surgeon: [name], MD;  Location: [facility] OR;  Service: Orthopedics;  Laterality: Left;  . Replacement  total knee bilateral Bilateral ~ 2007-2013"    "left-right"  . Shoulder arthroscopy w/ rotator cuff repair Bilateral 1992-1993    "right-left"  . Elbow arthroscopy with tendon reconstruction Right 1990  . 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 Incision and drainage abscess Right 04/11/2015    Procedure: INCISION AND DRAINAGE OF RIGHT THIGH ABSCESS;  Surgeon: Fransisco Hertz, MD;  Location: [facility] OR;  Service: Vascular;  Laterality: Right;  . Incision and drainage abscess Right 04/13/2015    Procedure: INCISION AND DRAINAGE THIGH ABSCESS;  Surgeon: Fransisco Hertz, MD;  Location: [facility] OR;  Service: Vascular;  Laterality: Right;  . I&d extremity Left 04/17/2015    Procedure: IRRIGATION AND DEBRIDEMENT LEFT EXTREMITY;  Surgeon: Fransisco Hertz, MD;  Location: [facility] OR;  Service: Vascular;  Laterality: Left;  Marland Kitchen Muscle flap closure Right 04/19/2015    Procedure: RIGHT RECTUS ABDOMINUS  FLAP TO RIGHT GROIN;  Surgeon: Glenna Fellows, MD;  Location: [facility] OR;  Service: Plastics;  Laterality: Right;  . Femoral revision Right 04/19/2015    Procedure: OVERSEW RIGHT FEMORAL VEIN ;  Surgeon: Chuck Hint, MD;  Location: [facility] OR;  Service: Vascular;  Laterality: Right;  .  Wound exploration Right 04/23/2015    Procedure: EXAM UNDER ANESTHESIA AND VAC CHANGE RIGHT THIGH;  Surgeon: Glenna Fellows, MD;  Location: [facility] OR;  Service: Plastics;  Laterality: Right;  . Amputation Right 05/17/2015    Procedure: Right 1st and 2nd Toe Amputation;  Surgeon: Nadara Mustard, MD;  Location: [facility] OR;  Service: Orthopedics;  Laterality: Right;  . I&d extremity Left 07/09/2015    Procedure: IRRIGATION AND DEBRIDEMENT LEFT WRIST;  Surgeon: Knute Neu, MD;  Location: [facility] OR;  Service: Plastics;  Laterality: Left;  . I&d extremity Left 08/08/2015    Procedure: IRRIGATION AND DEBRIDEMENT  EXTREMITY;  Surgeon: Knute Neu, MD;  Location: [facility] OR;  Service: Plastics;  Laterality: Left;  . Tonsillectomy  ~ 1972  . Appendectomy  ~ 1980  . Cholecystectomy  ~ 1982  . Inguinal hernia repair Right ~ 1969  . Hemorrhoidectomy with hemorrhoid banding  ~ 1985  . Knee arthroscopy Bilateral 1985-2008    "left-right"  . Joint replacement    . Uvulopalatoplasty  1991  . Cardiac catheterization  1993    "Dr. [place]; for vasospasms"  . Orif tibia fracture Left 08/17/2015    Procedure: OPEN REDUCTION INTERNAL FIXATION (ORIF) TIBIA FRACTURE;  Surgeon: Kathryne Hitch, MD;  Location: [facility] OR;  Service: Orthopedics;  Laterality: Left;   Family History  Problem Relation Age of Onset  . Heart attack Paternal Grandmother 95  . Heart disease Mother 43    arrythmia  . Stroke Paternal Grandfather   . Stroke Maternal Grandfather    Social History  Substance Use Topics  . Smoking status: Former Smoker -- 1.00 packs/day for 3 years    Types: Cigarettes    Quit date: 01/17/1971  . Smokeless tobacco: Never Used     Comment: "quit smoking cigarettes in 1979  . Alcohol Use: No    Review of Systems  Respiratory: Positive for shortness of breath. Negative for cough.   Cardiovascular: Positive for chest pain and leg swelling. Negative for palpitations.  Gastrointestinal: Negative for nausea, vomiting, abdominal pain and diarrhea.  All other systems reviewed and are negative.   Allergies  Verapamil; Imitrex; and Duloxetine hcl  Home Medications   Prior to Admission medications   Medication Sig Start Date End Date Taking? Authorizing Provider  albuterol (PROAIR HFA) 108 (90 BASE) MCG/ACT inhaler Inhale 1 puff into the lungs every 6 (six) hours as needed for wheezing or shortness of breath.  09/06/13   Historical Provider, MD  apixaban (ELIQUIS) 5 MG TABS tablet Take 1 tablet (5 mg total) by mouth 2 (two) times daily. 08/30/15   Mcarthur Rossetti Angiulli, PA-C  atorvastatin (LIPITOR) 80 MG tablet  Take 1 tablet (80 mg total) by mouth daily. 08/30/15   Mcarthur Rossetti Angiulli, PA-C  docusate sodium (COLACE) 100 MG capsule Take 1 capsule (100 mg total) by mouth 2 (two) times daily. Patient not taking: Reported on 01/17/2016 05/24/15   Mcarthur Rossetti Angiulli, PA-C  DULoxetine (CYMBALTA) 30 MG capsule Take 2 capsules (60 mg total) by mouth daily at 12 noon. Patient not taking: Reported on 01/17/2016 08/30/15   Mcarthur Rossetti Angiulli, PA-C  flecainide (TAMBOCOR) 100 MG tablet Take 1 tablet (100 mg total) by mouth every 12 (twelve) hours. 08/30/15   Mcarthur Rossetti Angiulli, PA-C  gabapentin (NEURONTIN) 100 MG capsule Take 1 capsule (100 mg total) by mouth 3 (three) times daily. 08/30/15   Mcarthur Rossetti Angiulli, PA-C  methocarbamol (ROBAXIN) 500 MG tablet Take 1 tablet (500 mg total)  by mouth every 6 (six) hours as needed for muscle spasms. 08/30/15   Mcarthur Rossettianiel J Angiulli, PA-C  Metoprolol Tartrate 75 MG TABS Take 75 mg by mouth 2 (two) times daily. 08/30/15   Mcarthur Rossettianiel J Angiulli, PA-C  morphine (MS CONTIN) 30 MG 12 hr tablet Take 3 tablets (90 mg total) by mouth every 12 (twelve) hours. 08/30/15   Mcarthur Rossettianiel J Angiulli, PA-C  Multiple Vitamin (MULTIVITAMIN WITH MINERALS) TABS tablet Take 1 tablet by mouth daily. 05/24/15   Mcarthur Rossettianiel J Angiulli, PA-C  naloxegol oxalate (MOVANTIK) 25 MG TABS tablet Take 1 tablet (25 mg total) by mouth daily before breakfast. 05/24/15   Mcarthur Rossettianiel J Angiulli, PA-C  oxyCODONE (ROXICODONE) 30 MG immediate release tablet Take 1 tablet (30 mg total) by mouth every 4 (four) hours as needed for moderate pain. Patient not taking: Reported on 01/17/2016 08/30/15   Mcarthur Rossettianiel J Angiulli, PA-C  oxyCODONE-acetaminophen (PERCOCET) 5-325 MG tablet Take 1-2 tablets by mouth every 6 (six) hours as needed. 12/02/15   Geoffery Lyonsouglas Delo, MD  polyethylene glycol (MIRALAX / GLYCOLAX) packet Take 17 g by mouth daily. 08/09/15   Shanker Levora DredgeM Ghimire, MD  SUMAtriptan (IMITREX) 50 MG tablet Take 50 mg by mouth daily as needed for migraine or headache. Maximum 2 doses  in 2 days 02/15/15   Historical Provider, MD  testosterone cypionate (DEPOTESTOSTERONE CYPIONATE) 200 MG/ML injection Inject 100 mg into the muscle every 14 (fourteen) days. Reported on 08/05/2015 06/25/15   Historical Provider, MD  trifluridine (VIROPTIC) 1 % ophthalmic solution Place 1 drop into both eyes 2 (two) times daily as needed (breakouts (blurry, matted eyelids)).    Historical Provider, MD  venlafaxine XR (EFFEXOR-XR) 75 MG 24 hr capsule Take 1 capsule (75 mg total) by mouth daily with breakfast. 08/30/15   Mcarthur Rossettianiel J Angiulli, PA-C   BP 128/94 mmHg  Pulse 91  Temp(Src) 97.8 F (36.6 C) (Oral)  Resp 20  SpO2 100%   Physical Exam  Constitutional: He is oriented to person, place, and time. He appears well-developed and well-nourished. No distress.  Obese  HENT:  Head: Normocephalic and atraumatic.  Eyes: Conjunctivae are normal. Pupils are equal, round, and reactive to light. Right eye exhibits no discharge. Left eye exhibits no discharge. No scleral icterus.  Neck: Normal range of motion.  Cardiovascular: Normal rate and regular rhythm.  Exam reveals no gallop and no friction rub.   No murmur heard. Pulmonary/Chest: Effort normal and breath sounds normal. No respiratory distress. He has no wheezes. He has no rales. He exhibits no tenderness.  Abdominal: Soft. Bowel sounds are normal. He exhibits no distension and no mass. There is no tenderness. There is no rebound and no guarding.  Large right side hernia. Well healed surgical scar  Musculoskeletal:  Mild swelling over bilateral ankles. No calf tenderness. 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  Neurological: He is alert and oriented to person, place, and time.  Skin: Skin is warm and dry.  Psychiatric: He has a normal mood and affect.    ED Course  Procedures (including critical care time) Labs Review Labs Reviewed  BASIC METABOLIC PANEL - Abnormal; Notable for the following:    Chloride 112 (*)    CO2 18 (*)    All  other components within normal limits  HEPARIN LEVEL (UNFRACTIONATED) - Abnormal; Notable for the following:    Heparin Unfractionated <0.10 (*)    All other components within normal limits  CBC  BRAIN NATRIURETIC PEPTIDE  APTT  APTT  HEPARIN LEVEL (UNFRACTIONATED)  TROPONIN I  Rosezena SensorI-STAT TROPOININ, ED    Imaging Review Dg Chest 2 View  01/27/2016  CLINICAL DATA:  Chest pain, shortness of Breath EXAM: CHEST  2 VIEW COMPARISON:  08/04/2015 FINDINGS: Cardiomediastinal silhouette is stable. No acute infiltrate or pleural effusion. No pulmonary edema. Bony thorax is unremarkable. IMPRESSION: No active cardiopulmonary disease. Electronically Signed   By: Natasha MeadLiviu  Pop M.D.   On: 01/27/2016 11:31   Ct Angio Chest Pe W/cm &/or Wo Cm  01/27/2016  CLINICAL DATA:  Chest pain and shortness of breath since this morning. History of pulmonary embolism. Patient on anticoagulant therapy. EXAM: CT ANGIOGRAPHY CHEST WITH CONTRAST TECHNIQUE: Multidetector CT imaging of the chest was performed using the standard protocol during bolus administration of intravenous contrast. Multiplanar CT image reconstructions and MIPs were obtained to evaluate the vascular anatomy. CONTRAST:  80 mL Isovue 370 IV COMPARISON:  CT 02/25/2015 FINDINGS: Mediastinum/Lymph Nodes: Heart is normal size. Thoracic aorta is within normal. Pulmonary arteries are well opacified as there is a focal filling defect over a proximal right lower lobar pulmonary artery compatible with embolus. No left-sided emboli are visualized. RV/LV ratio is 49.6/50.0 = 0.99. Ratio is greater than 0.9 and compatible with mild right heart strain/ intermediate risk pulmonary embolism. No evidence of mediastinal or hilar adenopathy. Remaining mediastinal structures are within normal. Lungs/Pleura: No pulmonary mass, infiltrate, or effusion. Airways are normal. Upper abdomen: Stable left liver cyst. Musculoskeletal: Mild degenerate change of the spine. Review of the MIP images  confirms the above findings. IMPRESSION: Small burden of pulmonary embolism over a right lower lobar pulmonary artery. Positive for acute PE with CT evidence of right heart strain (RV/LV Ratio = 0.99) consistent with at least submassive (intermediate risk) PE. The presence of right heart strain has been associated with an increased risk of morbidity and mortality. Please activate Code PE by paging [phone]. Left lobe liver cyst. Critical Value/emergent results were called by telephone at the time of interpretation on 01/27/2016 at 1:44 pm to Dr. Frederick Peersachel Little, who verbally acknowledged these results. Electronically Signed   By: Elberta Fortisaniel  Boyle M.D.   On: 01/27/2016 13:44   I have personally reviewed and evaluated these images and lab results as part of my medical decision-making.   EKG Interpretation   Date/Time:  Monday January 27 2016 10:13:13 EDT Ventricular Rate:  90 PR Interval:    QRS Duration: 103 QT Interval:  351 QTC Calculation: 430 R Axis:   4 Text Interpretation:  Sinus rhythm Prolonged PR interval Abnormal R-wave  progression, early transition ST elevation, consider inferior injury since  previous tracing tachycardia has resolved Confirmed by LITTLE MD, RACHEL  ([id number]) on 01/27/2016 12:03:50 PM      MDM   Final diagnoses:  Other acute pulmonary embolism (HCC)   60 year old male with acute PE. CTA of chest shows small PE over right lower lobar pulmonary artery with evidence of heart strain. Critical care team notified. Patient has had stable vitals signs here in ED - he's not tachycardic, not hypoxic, and normotensive. Tachypneic at times. CBC, BMP, BNP are unremarkable. Troponin normal. EKG shows NSR with a prolonged PR interval and mild ST elevation in inferior leads.  Contacted critical care team who stated that since patient is hemodynamically stable they can see patient in consult if hospitalists need them. Spoke with Aurelio BrashK. Black, NP who will come to admit patient under Michael LitterNikki  Carter, MD.     Bethel BornKelly Marie Gekas, PA-C  01/27/16 1602  Laurence Spates, MD 01/27/16 [phone]

## 2016-01-27 NOTE — ED Notes (Signed)
Admitting at bedside will transport after

## 2016-01-27 NOTE — Progress Notes (Signed)
01/27/2016 patient came from the emergency room to 2 central at 1709. He is alert, oriented, and ambulatory. Patient skin heels dry. 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 Patient have discoloration on lower left foot the ankle area from a prior spiral fracture.[facility] Medical CenterNadine Izzak Fries RN.

## 2016-01-28 ENCOUNTER — Inpatient Hospital Stay (HOSPITAL_COMMUNITY): Payer: Medicaid Other

## 2016-01-28 DIAGNOSIS — I480 Paroxysmal atrial fibrillation: Secondary | ICD-10-CM

## 2016-01-28 DIAGNOSIS — I10 Essential (primary) hypertension: Secondary | ICD-10-CM

## 2016-01-28 DIAGNOSIS — E785 Hyperlipidemia, unspecified: Secondary | ICD-10-CM

## 2016-01-28 DIAGNOSIS — I5032 Chronic diastolic (congestive) heart failure: Secondary | ICD-10-CM

## 2016-01-28 DIAGNOSIS — I2699 Other pulmonary embolism without acute cor pulmonale: Principal | ICD-10-CM

## 2016-01-28 DIAGNOSIS — R079 Chest pain, unspecified: Secondary | ICD-10-CM

## 2016-01-28 DIAGNOSIS — G894 Chronic pain syndrome: Secondary | ICD-10-CM

## 2016-01-28 DIAGNOSIS — R0602 Shortness of breath: Secondary | ICD-10-CM

## 2016-01-28 LAB — ECHOCARDIOGRAM COMPLETE
EERAT: 5.31
EWDT: 232 ms
FS: 30 % (ref 28–44)
HEIGHTINCHES: 75 in
IVS/LV PW RATIO, ED: 0.97
LA diam end sys: 36 mm
LA diam index: 1.4 cm/m2
LA vol index: 27.7 mL/m2
LA vol: 71.5 mL
LASIZE: 36 mm
LAVOLA4C: 63.8 mL
LDCA: 3.14 cm2
LV E/e' medial: 5.31
LV E/e'average: 5.31
LV SIMPSON'S DISK: 61
LV TDI E'LATERAL: 12.1
LVDIAVOL: 76 mL (ref 62–150)
LVDIAVOLIN: 29 mL/m2
LVELAT: 12.1 cm/s
LVOT SV: 62 mL
LVOT VTI: 19.6 cm
LVOT peak vel: 109 cm/s
LVOTD: 20 mm
LVSYSVOL: 30 mL (ref 21–61)
LVSYSVOLIN: 12 mL/m2
MV Dec: 232
MV pk A vel: 53.3 m/s
MV pk E vel: 64.3 m/s
PW: 13.1 mm — AB (ref 0.6–1.1)
Reg peak vel: 209 cm/s
Stroke v: 46 ml
TDI e' medial: 11.4
TR max vel: 209 cm/s
WEIGHTICAEL: 4310.43 [oz_av]

## 2016-01-28 LAB — BASIC METABOLIC PANEL
ANION GAP: 6 (ref 5–15)
BUN: 13 mg/dL (ref 6–20)
CALCIUM: 8.8 mg/dL — AB (ref 8.9–10.3)
CO2: 21 mmol/L — AB (ref 22–32)
Chloride: 109 mmol/L (ref 101–111)
Creatinine, Ser: 1.19 mg/dL (ref 0.61–1.24)
Glucose, Bld: 109 mg/dL — ABNORMAL HIGH (ref 65–99)
POTASSIUM: 4.5 mmol/L (ref 3.5–5.1)
Sodium: 136 mmol/L (ref 135–145)

## 2016-01-28 LAB — HEPARIN LEVEL (UNFRACTIONATED): HEPARIN UNFRACTIONATED: 0.68 [IU]/mL (ref 0.30–0.70)

## 2016-01-28 LAB — CBC
HEMATOCRIT: 43.2 % (ref 39.0–52.0)
Hemoglobin: 14 g/dL (ref 13.0–17.0)
MCH: 29.5 pg (ref 26.0–34.0)
MCHC: 32.4 g/dL (ref 30.0–36.0)
MCV: 90.9 fL (ref 78.0–100.0)
PLATELETS: 281 10*3/uL (ref 150–400)
RBC: 4.75 MIL/uL (ref 4.22–5.81)
RDW: 15.2 % (ref 11.5–15.5)
WBC: 7.6 10*3/uL (ref 4.0–10.5)

## 2016-01-28 LAB — TROPONIN I: Troponin I: <0.03 ng/mL (ref <0.03)

## 2016-01-28 NOTE — Progress Notes (Signed)
PROGRESS NOTE    [name]  NWG:[id number] DOB: [birth year] DOA: 01/27/2016 PCP: [name], MD   Brief Narrative:  60 y.o. WM PMHx Depression, HTN, SVT, Chronic Diastolic CHF, Atrial Fibrillation, Chronic PVD, Osteomyelitis Lt wrist/Rt foot, MSSA Bacteremia, DVT   Presents to emergency department with a chief complaint of sudden onset chest pain. Initial evaluation reveals pulmonary embolism.  Information is obtained from the patient. He reports being in his usual state of health until this morning developed sudden onset chest pain. He describes the pain as sharp constant located substernal worse with inspiration. Pain is nonradiating. He denies shortness of breath diaphoresis nausea vomiting. He denies hemoptysis, headache dizziness syncope or near-syncope. He denies lower extremity edema or orthopnea. He denies abdominal pain dysuria hematuria frequency or urgency. He denies diarrhea melena bright red blood per rectum. He denies any recent travel or sick contacts. He denies fever chills cough.   Assessment & Plan:   Principal Problem:   PE (pulmonary embolism) Active Problems:   Chronic pain syndrome   Chronic use of opiate drugs therapeutic purposes   Chest pain   Essential hypertension   HLD (hyperlipidemia)   Chronic diastolic congestive heart failure (HCC)   Chronic venous insufficiency   Dysrhythmia   Acute PE with right heart strain/DVT  -Patient has been noncompliant with his Eliquis -Counseled patient and wife that failure to comply with treatment can result in death -Continue Heparin per pharmacy for 48 hours and then restart Eliquis -Lower extremity Doppler; negative for DVT  -Echocardiogram: Normal   Chronic Diastolic CHF   -Compensated. Echo done September 2016 reveals EF of 55-60% and grade 1 diastolic dysfunction -7/4 echocardiogram: Normal see results below -Strict in and out -Daily weight Filed Weights   01/27/16 1715 01/28/16 0658  Weight: 120.7 kg  (266 lb 1.5 oz) 122.2 kg (269 lb 6.4 oz)  -Atenolol100 mg daily -Lipitor 80 mg daily  Hypertension. -See chronic diastolic CHF  Chest pain. - Likely related to acute PE  -Cycle troponin: Negative -Resolved  Dysrhythmia:  -Hx Paroxysmal A. Fib and paroxysmal SVT.  -Currently in NSR   Chronic venous insufficiency.  -Stable at baseline  Chronic pain syndrome.  -Continue home pain management regimen    DVT prophylaxis: Heparin drip Code Status: Full Family Communication: Wife present at bedside Disposition Plan: 72 hours after restart of Eliquis   Consultants:  Dr. Vassie Loll PC CM    Procedures/Significant Events:  7/3 CT angiogram chest PE protocol;-Small burden PE RLL pulmonary artery. -Positive Acute PE with CT evidence of right heart strain (RV/LV Ratio = 0.99) C/W submassive (intermediate risk) PE. 7/4 bilateral lower extremity Doppler; negative for DVT 7/4 echocardiogram; LVEF;-55% to 65%.   Cultures NA  Antimicrobials: NA   Devices NA   LINES / TUBES:  NA    Continuous Infusions: . heparin 1,750 Units/hr (01/28/16 1317)     Subjective: 7/4  A/O 4, NAD. Patient states HAS NOT been taking Eliquis as prescribed. States mother also had PE/DVT. Wife present and states she reminds him daily to take medication    Objective: Filed Vitals:   01/28/16 0434 01/28/16 0658 01/28/16 0757 01/28/16 1200  BP: 121/86  132/85 114/70  Pulse: 82  76 69  Temp: 97.6 F (36.4 C)  97.7 F (36.5 C) 98.5 F (36.9 C)  TempSrc: Axillary  Oral Oral  Resp: Height:      Weight:  122.2 kg (269 lb 6.4 oz)  SpO2: 98%  94% 97%    Intake/Output Summary (Last 24 hours) at 01/28/16 1411 Last data filed at 01/28/16 [id number]  Gross per 24 hour  Intake    720 ml  Output   1950 ml  Net  -1230 ml   Filed Weights   01/27/16 1715 01/28/16 0658  Weight: 120.7 kg (266 lb 1.5 oz) 122.2 kg (269 lb 6.4 oz)    Examination:  General: A/O 4, NAD, No acute respiratory  distress Eyes: negative scleral hemorrhage, negative anisocoria, negative icterus ENT: Negative Runny nose, negative gingival bleeding, Neck:  Negative scars, masses, torticollis, lymphadenopathy, JVD Lungs: Clear to auscultation bilaterally without wheezes or crackles Cardiovascular: Regular rate and rhythm without murmur gallop or rub normal S1 and S2 Abdomen: negative abdominal pain, nondistended, positive soft, bowel sounds, no rebound, no ascites, no appreciable mass Extremities: No significant cyanosis, clubbing, or edema bilateral lower extremities Skin: Negative rashes, lesions, ulcers Psychiatric:  Negative depression, negative anxiety, negative fatigue, negative mania  Central nervous system:  Cranial nerves II through XII intact, tongue/uvula midline, all extremities muscle strength 5/5, sensation intact throughout,  negative dysarthria, negative expressive aphasia, negative receptive aphasia.  .     Data Reviewed: Care during the described time interval was provided by me .  I have reviewed this patient's available data, including medical history, events of note, physical examination, and all test results as part of my evaluation. I have personally reviewed and interpreted all radiology studies.  CBC:  Recent Labs Lab 01/27/16 1040 01/28/16 0421  WBC 8.3 7.6  HGB 15.2 14.0  HCT 47.1 43.2  MCV 91.6 90.9  PLT 255 281   Basic Metabolic Panel:  Recent Labs Lab 01/27/16 1040 01/28/16 0421  NA 138 136  K 4.5 4.5  CL 112* 109  CO2 18* 21*  GLUCOSE 88 109*  BUN 17 13  CREATININE 0.92 1.19  CALCIUM 9.5 8.8*   GFR: Estimated Creatinine Clearance: 96.5 mL/min (by C-G formula based on Cr of 1.19). Liver Function Tests: No results for input(s): AST, ALT, ALKPHOS, BILITOT, PROT, ALBUMIN in the last 168 hours. No results for input(s): LIPASE, AMYLASE in the last 168 hours. No results for input(s): AMMONIA in the last 168 hours. Coagulation Profile: No results for  input(s): INR, PROTIME in the last 168 hours. Cardiac Enzymes:  Recent Labs Lab 01/27/16 1815 01/28/16 0421  TROPONINI <0.03 <0.03   BNP (last 3 results) No results for input(s): PROBNP in the last 8760 hours. HbA1C: No results for input(s): HGBA1C in the last 72 hours. CBG: No results for input(s): GLUCAP in the last 168 hours. Lipid Profile: No results for input(s): CHOL, HDL, LDLCALC, TRIG, CHOLHDL, LDLDIRECT in the last 72 hours. Thyroid Function Tests: No results for input(s): TSH, T4TOTAL, FREET4, T3FREE, THYROIDAB in the last 72 hours. Anemia Panel: No results for input(s): VITAMINB12, FOLATE, FERRITIN, TIBC, IRON, RETICCTPCT in the last 72 hours. Urine analysis:    Component Value Date/Time   COLORURINE YELLOW 08/04/2015 1217   APPEARANCEUR CLEAR 08/04/2015 1217   LABSPEC 1.016 08/04/2015 1217   PHURINE 5.5 08/04/2015 1217   GLUCOSEU NEGATIVE 08/04/2015 1217   HGBUR MODERATE* 08/04/2015 1217   BILIRUBINUR NEGATIVE 08/04/2015 1217   KETONESUR NEGATIVE 08/04/2015 1217   PROTEINUR NEGATIVE 08/04/2015 1217   UROBILINOGEN 1.0 05/08/2015 1942   NITRITE NEGATIVE 08/04/2015 1217   LEUKOCYTESUR NEGATIVE 08/04/2015 1217   Sepsis Labs: @LABRCNTIP (procalcitonin:4,lacticidven:4)  ) Recent Results (from the past 240 hour(s))  MRSA PCR Screening  Status: None   Collection Time: 01/27/16  6:24 PM  Result Value Ref Range Status   MRSA by PCR NEGATIVE NEGATIVE Final    Comment:        The GeneXpert MRSA Assay (FDA approved for NASAL specimens only), is one component of a comprehensive MRSA colonization surveillance program. It is not intended to diagnose MRSA infection nor to guide or monitor treatment for MRSA infections.          Radiology Studies: Dg Chest 2 View  01/27/2016  CLINICAL DATA:  Chest pain, shortness of Breath EXAM: CHEST  2 VIEW COMPARISON:  08/04/2015 FINDINGS: Cardiomediastinal silhouette is stable. No acute infiltrate or pleural effusion.  No pulmonary edema. Bony thorax is unremarkable. IMPRESSION: No active cardiopulmonary disease. Electronically Signed   By: Natasha MeadLiviu  Pop M.D.   On: 01/27/2016 11:31   Ct Angio Chest Pe W/cm &/or Wo Cm  01/27/2016  CLINICAL DATA:  Chest pain and shortness of breath since this morning. History of pulmonary embolism. Patient on anticoagulant therapy. EXAM: CT ANGIOGRAPHY CHEST WITH CONTRAST TECHNIQUE: Multidetector CT imaging of the chest was performed using the standard protocol during bolus administration of intravenous contrast. Multiplanar CT image reconstructions and MIPs were obtained to evaluate the vascular anatomy. CONTRAST:  80 mL Isovue 370 IV COMPARISON:  CT 02/25/2015 FINDINGS: Mediastinum/Lymph Nodes: Heart is normal size. Thoracic aorta is within normal. Pulmonary arteries are well opacified as there is a focal filling defect over a proximal right lower lobar pulmonary artery compatible with embolus. No left-sided emboli are visualized. RV/LV ratio is 49.6/50.0 = 0.99. Ratio is greater than 0.9 and compatible with mild right heart strain/ intermediate risk pulmonary embolism. No evidence of mediastinal or hilar adenopathy. Remaining mediastinal structures are within normal. Lungs/Pleura: No pulmonary mass, infiltrate, or effusion. Airways are normal. Upper abdomen: Stable left liver cyst. Musculoskeletal: Mild degenerate change of the spine. Review of the MIP images confirms the above findings. IMPRESSION: Small burden of pulmonary embolism over a right lower lobar pulmonary artery. Positive for acute PE with CT evidence of right heart strain (RV/LV Ratio = 0.99) consistent with at least submassive (intermediate risk) PE. The presence of right heart strain has been associated with an increased risk of morbidity and mortality. Please activate Code PE by paging [phone]. Left lobe liver cyst. Critical Value/emergent results were called by telephone at the time of interpretation on 01/27/2016 at 1:44 pm  to Dr. Frederick Peersachel Little, who verbally acknowledged these results. Electronically Signed   By: Elberta Fortisaniel  Boyle M.D.   On: 01/27/2016 13:44        Scheduled Meds: . atenolol  100 mg Oral BID  . atorvastatin  80 mg Oral Daily  . flecainide  100 mg Oral Q12H  . gabapentin  100 mg Oral TID  . morphine  90 mg Oral Q12H  . oxyCODONE  30 mg Oral Q6H  . temazepam  30 mg Oral QHS  . venlafaxine XR  75 mg Oral BID   Continuous Infusions: . heparin 1,750 Units/hr (01/28/16 1317)     LOS: 1 day    Time spent: 40 minutes    Maxamilian Amadon, Roselind MessierURTIS J, MD [facility] Pager [phone]   If 7PM-7AM, please contact night-coverage [email or web address] Password [email or web address] 01/28/2016, 2:11 PM

## 2016-01-28 NOTE — Progress Notes (Signed)
  Echocardiogram 2D Echocardiogram has been performed.  Janalyn HarderWest, Hasson Gaspard R 01/28/2016, 11:44 AM

## 2016-01-28 NOTE — Progress Notes (Addendum)
*  Preliminary Results* Bilateral lower extremity venous duplex completed. Study was technically difficult due to patient anatomy; patient has extensive history of right lower extremity surgeries which has altered the natural anatomy.  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 The right greater saphenous vein is patent without any evidence of thrombus throughout its length.  The left lower extremity is negative for deep vein thrombosis. There is no evidence of Baker's cyst bilaterally.  01/28/2016 10:23 AM  Gertie FeyMichelle Sherica Paternostro, RVT, RDCS, RDMS

## 2016-01-28 NOTE — Progress Notes (Signed)
ANTICOAGULATION CONSULT NOTE - Follow Up Consult  Pharmacy Consult for Heparin Indication: pulmonary embolus  Allergies  Allergen Reactions  . Verapamil Other (See Comments)    Causes pvc's  . Imitrex [Sumatriptan] Other (See Comments)    Chest pain, 2004 (tolerates 50 mg prn)  . Duloxetine Hcl     Psychosis    Patient Measurements: Height: 6\' 3"  (190.5 cm) Weight: 269 lb 6.4 oz (122.2 kg) IBW/kg (Calculated) : 84.5 Heparin Dosing Weight: 110 kg  Vital Signs: Temp: 97.7 F (36.5 C) (07/04 0757) Temp Source: Oral (07/04 0757) BP: 132/85 mmHg (07/04 0757) Pulse Rate: 76 (07/04 0757)  Labs:  Recent Labs  01/27/16 1040 01/27/16 1423 01/27/16 1815 01/27/16 2117 01/28/16 0421  HGB 15.2  --   --   --  14.0  HCT 47.1  --   --   --  43.2  PLT 255  --   --   --  281  APTT  --  32  --  127*  --   HEPARINUNFRC  --  <0.10*  --  0.62 0.68  CREATININE 0.92  --   --   --  1.19  TROPONINI  --   --  <0.03  --  <0.03    Estimated Creatinine Clearance: 96.5 mL/min (by C-G formula based on Cr of 1.19).   Medications:  Heparin @ 1800 units/hr (18 ml/hr)  Assessment: 59 YOM with hx DVT/Afib on Eliquis PTA who presented to [facility] with CP with work-up revealing new submassive acute PE with R-heart strain. The patient acknowledged missed doses of Eliquis PTA. Pharmacy consulted to start heparin for anticoagulation.  Heparin level this morning remains therapeutic (HL0.68 << 0.62, goal of 0.3-0.7). CBC wnl - no bleeding noted at this time. Will reduce the heparin drip slightly to keep within range and follow-up with a heparin level in the AM  Goal of Therapy:  Heparin level 0.3-0.7 units/ml Monitor platelets by anticoagulation protocol: Yes   Plan:  1. Reduce Heparin drip rate slightly to 1750 units/hr (17.5 ml/hr) 2. Will continue to monitor for any signs/symptoms of bleeding and will follow up with heparin level in the a.m.  3. Will follow-up plans to transition to oral  [facility]  Georgina PillionElizabeth Brittanie Dosanjh, PharmD, BCPS Clinical Pharmacist Pager: [phone] 01/28/2016 9:32 AM

## 2016-01-29 LAB — CBC
HCT: 44.7 % (ref 39.0–52.0)
HEMOGLOBIN: 14.2 g/dL (ref 13.0–17.0)
MCH: 29 pg (ref 26.0–34.0)
MCHC: 31.8 g/dL (ref 30.0–36.0)
MCV: 91.4 fL (ref 78.0–100.0)
Platelets: 281 10*3/uL (ref 150–400)
RBC: 4.89 MIL/uL (ref 4.22–5.81)
RDW: 15.3 % (ref 11.5–15.5)
WBC: 7.4 10*3/uL (ref 4.0–10.5)

## 2016-01-29 LAB — HEPARIN LEVEL (UNFRACTIONATED): Heparin Unfractionated: 0.45 IU/mL (ref 0.30–0.70)

## 2016-01-29 MED ORDER — APIXABAN 5 MG PO TABS
10.0000 mg | ORAL_TABLET | Freq: Two times a day (BID) | ORAL | Status: DC
  Administered 2016-01-29 – 2016-01-30 (×3): 10 mg via ORAL
  Filled 2016-01-29 (×3): qty 2

## 2016-01-29 MED ORDER — APIXABAN 5 MG PO TABS: 5.0000 mg | ORAL_TABLET | Freq: Two times a day (BID) | ORAL | Status: DC

## 2016-01-29 MED ORDER — ALBUTEROL SULFATE (2.5 MG/3ML) 0.083% IN NEBU: 2.5000 mg | INHALATION_SOLUTION | RESPIRATORY_TRACT | Status: DC | PRN

## 2016-01-29 NOTE — Progress Notes (Signed)
ANTICOAGULATION CONSULT NOTE - Follow Up Consult  Pharmacy Consult for Heparin Indication: pulmonary embolus  Allergies  Allergen Reactions  . Verapamil Other (See Comments)    Causes pvc's  . Imitrex [Sumatriptan] Other (See Comments)    Chest pain, 2004 (tolerates 50 mg prn)  . Duloxetine Hcl     Psychosis    Patient Measurements: Height: 6\' 3"  (190.5 cm) Weight: 269 lb 6.4 oz (122.2 kg) IBW/kg (Calculated) : 84.5 Heparin Dosing Weight: 110 kg  Vital Signs: Temp: 98.2 F (36.8 C) (07/05 0412) Temp Source: Oral (07/05 0412) BP: 116/70 mmHg (07/05 0644) Pulse Rate: 70 (07/05 0412)  Labs:  Recent Labs  01/27/16 1040  01/27/16 1423 01/27/16 1815 01/27/16 2117 01/28/16 0421 01/29/16 0436  HGB 15.2  --   --   --   --  14.0 14.2  HCT 47.1  --   --   --   --  43.2 44.7  PLT 255  --   --   --   --  281 281  APTT  --   --  32  --  127*  --   --   HEPARINUNFRC  --   < > <0.10*  --  0.62 0.68 0.45  CREATININE 0.92  --   --   --   --  1.19  --   TROPONINI  --   --   --  <0.03  --  <0.03  --   < > = values in this interval not displayed.  Estimated Creatinine Clearance: 96.5 mL/min (by C-G formula based on Cr of 1.19).   Medications:  Heparin @ 1750 units/hr (17.5 ml/hr)  Assessment: [name] with hx DVT/Afib on Eliquis PTA who presented to the [facility] with CP with work-up revealing new submassive acute PE with R-heart strain. The patient acknowledged missed doses of Eliquis PTA. Pharmacy consulted to start heparin for anticoagulation.  Heparin level this morning remains therapeutic (HL0.45, goal of 0.3-0.7). CBC wnl - no bleeding noted at this time. Will be 48 hours of IV Heparin tonight.   Goal of Therapy:  Heparin level 0.3-0.7 units/ml Monitor platelets by anticoagulation protocol: Yes   Plan:  1. Continue Heparin drip rate at 1750 units/hr (17.5 ml/hr) 2. Will continue to monitor for any signs/symptoms of bleeding and will follow up with heparin level daily while  on therapy. 3. Will follow-up plans to transition to oral [facility]  Link SnufferJessica Gamaliel Charney, PharmD, BCPS Clinical Pharmacist [phone]  01/29/2016 7:16 AM

## 2016-01-29 NOTE — Progress Notes (Signed)
Patient noted with 1st degree AV block, incomplete right bundle branch block. Patient denied any chest pain. Oncall TRH text paged.

## 2016-01-29 NOTE — Progress Notes (Signed)
ANTICOAGULATION CONSULT NOTE - Initial Consult  Pharmacy Consult for Apixaban Indication: pulmonary embolus  Allergies  Allergen Reactions  . Verapamil Other (See Comments)    Causes pvc's  . Imitrex [Sumatriptan] Other (See Comments)    Chest pain, 2004 (tolerates 50 mg prn)  . Duloxetine Hcl     Psychosis    Patient Measurements: Height: 6\' 3"  (190.5 cm) Weight: 269 lb 6.4 oz (122.2 kg) IBW/kg (Calculated) : 84.5  Vital Signs: Temp: 97.7 F (36.5 C) (07/05 1100) Temp Source: Oral (07/05 1100) BP: 102/89 mmHg (07/05 1100) Pulse Rate: 55 (07/05 1100)  Labs:  Recent Labs  01/27/16 1040  01/27/16 1423 01/27/16 1815 01/27/16 2117 01/28/16 0421 01/29/16 0436  HGB 15.2  --   --   --   --  14.0 14.2  HCT 47.1  --   --   --   --  43.2 44.7  PLT 255  --   --   --   --  281 281  APTT  --   --  32  --  127*  --   --   HEPARINUNFRC  --   < > <0.10*  --  0.62 0.68 0.45  CREATININE 0.92  --   --   --   --  1.19  --   TROPONINI  --   --   --  <0.03  --  <0.03  --   < > = values in this interval not displayed.  Estimated Creatinine Clearance: 96.5 mL/min (by C-G formula based on Cr of 1.19).   Medical History: Past Medical History  Diagnosis Date  . Hypertension   . Chronic knee pain     "both sides"  . Hyperlipemia   . PUD (peptic ulcer disease)   . Peripheral vascular disease (HCC)   . Anxiety   . GERD (gastroesophageal reflux disease)   . 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 Atrial fibrillation (HCC) dx'd 04/2015  . Chronic bronchitis (HCC)     "used to get it q yr; nothing in the last ~ 5 yrs" (08/15/2015)  . Migraine     "q 3 months" (08/15/2015)  . Osteoarthritis of both knees   . Depression   . Chronic left shoulder pain    Assessment: 60 year old male with hx DVT/Afib on Apixaban PTA who is now on IV heparin per pharmacy consult for new PE due to non-compliance with Apixaban. Pharmacy consulted to  change IV heparin back to Apixaban.    Goal of Therapy:  Monitor platelets by anticoagulation protocol: Yes   Plan:  Resume Apixaban 10mg  po BID starting now for 7 days, then on Wednesday July 12th reduce dose to Apixaban 5mg  po BID.  Stop heparin at time of 1st Apixaban dose.  Monitor for signs and symptoms of bleeding.  Will re-educate on compliance with Apixaban.   Fayne NorrieMillen, Finnian Husted Brown 01/29/2016,12:56 PM

## 2016-01-29 NOTE — Discharge Instructions (Signed)
Information on my medicine - ELIQUIS (apixaban)  This medication education was reviewed with me or my healthcare representative as part of my discharge preparation.  The pharmacist that spoke with me during my hospital stay was:  Fayne NorrieMillen, Angelynn Lemus Brown, [facility]PH  Why was Eliquis prescribed for you? Eliquis was prescribed to treat blood clots that may have been found in the veins of your legs (deep vein thrombosis) or in your lungs (pulmonary embolism) and to reduce the risk of them occurring again.  What do You need to know about Eliquis ? The starting dose is 10 mg (two 5 mg tablets) taken TWICE daily for the FIRST SEVEN (7) DAYS, then on Wednesday, July 12th the dose is reduced to ONE 5 mg tablet taken TWICE daily.  Eliquis may be taken with or without food.   Try to take the dose about the same time in the morning and in the evening. If you have difficulty swallowing the tablet whole please discuss with your pharmacist how to take the medication safely.  Take Eliquis exactly as prescribed and DO NOT stop taking Eliquis without talking to the doctor who prescribed the medication.  Stopping may increase your risk of developing a new blood clot.  Refill your prescription before you run out.  After discharge, you should have regular check-up appointments with your healthcare provider that is prescribing your Eliquis.    What do you do if you miss a dose? If a dose of ELIQUIS is not taken at the scheduled time, take it as soon as possible on the same day and twice-daily administration should be resumed. The dose should not be doubled to make up for a missed dose.  Important Safety Information A possible side effect of Eliquis is bleeding. You should call your healthcare provider right away if you experience any of the following: ? Bleeding from an injury or your nose that does not stop. ? Unusual colored urine (red or dark brown) or unusual colored stools (red or black). ? Unusual bruising  for unknown reasons. ? A serious fall or if you hit your head (even if there is no bleeding).  Some medicines may interact with Eliquis and might increase your risk of bleeding or clotting while on Eliquis. To help avoid this, consult your healthcare provider or pharmacist prior to using any new prescription or non-prescription medications, including herbals, vitamins, non-steroidal anti-inflammatory drugs (NSAIDs) and supplements.  This website has more information on Eliquis (apixaban): http://www.eliquis.com/eliquis/home

## 2016-01-29 NOTE — Progress Notes (Signed)
[place] TEAM 1 - Stepdown/ICU TEAM   Craig MunchJimmy F [name]  GEX:[id number] DOB: 1956/05/28 DOA: 01/27/2016 PCP: Patria ManeGASSEMI, MIKE, MD   Brief Narrative:  60 y.o. 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 Initial evaluation revealed a pulmonary embolism.  He admitted noncompliance w/ his chronic Eliquis tx.    Assessment & Plan:   Acute PE with right heart strain / DVT  -has been noncompliant with his Eliquis -The patient has now been counseled by 2 physicians on the absolute importance of strict compliance with his anticoagulation, and the fact that he will now need lifelong anticoagulation -Transition back to oral anticoagulation today and begin ambulation with plan for probable discharge home in a.m. -Lower extremity Doppler; negative for DVT  -Echocardiogram: Normal w/ no evidence of R heart strain or failure   Chronic Diastolic CHF   -TTE Sept 2016 revealed EF of 55-60% and grade 1 diastolic dysfunction -7/4 TTE Normal without evidence of diastolic dysfunction -No clinical sign of volume overload at present  Hypertension. -Well-controlled at present  Dysrhythmia -Hx Paroxysmal A. Fib and paroxysmal SVT - on chronic anticoagulation -Currently in NSR / sinus bradycardia  Chronic venous insufficiency -Stable at baseline  Chronic pain syndrome -Continue home pain management regimen   DVT prophylaxis: Heparin drip > Eliquis  Code Status: Full Family Communication: No family present at time of exam today Disposition Plan: Discharge home in a.m. if does well with ambulation   Consultants:  none   Procedures/Significant Events:  7/3 CT angiogram chest PE protocol  Small burden PE RLL pulmonary artery. -Positive Acute PE with  CT evidence of right heart strain (RV/LV Ratio = 0.99) C/W submassive (intermediate risk) PE. 7/4 bilateral lower extremity Doppler negative for DVT 7/4 echocardiogram LVEF;-55% to 65%.   Antimicrobials: NA  Subjective: The patient is resting comfortably in bed.  He has not yet been off for attempted to ambulate.  He denies shortness breath fevers chills nausea or vomiting at present.  He does continue to report some pleuritic type chest pain with deep breaths.   Objective: Filed Vitals:   01/29/16 0100 01/29/16 0412 01/29/16 0644 01/29/16 0745  BP:  104/70 116/70 104/86  Pulse:  70  61  Temp: 98.1 F (36.7 C) 98.2 F (36.8 C)  98 F (36.7 C)  TempSrc:  Oral  Oral  Resp:  16    Height:      Weight:      SpO2:  97%  96%    Intake/Output Summary (Last 24 hours) at 01/29/16 1129 Last data filed at 01/29/16 0102  Gross per 24 hour  Intake 1307.5 ml  Output   1375 ml  Net  -67.5 ml   Filed Weights   01/27/16 1715 01/28/16 0658  Weight: 120.7 kg (266 lb 1.5 oz) 122.2 kg (269 lb 6.4 oz)    Examination: General: No acute respiratory distress Lungs: Clear to auscultation bilaterally without wheezes or crackles Cardiovascular: Regular rate and rhythm without murmur gallop or rub normal S1 and S2 Abdomen: Nontender, nondistended, soft, bowel sounds positive, no rebound, no ascites, no appreciable mass Extremities: No significant cyanosis, clubbing, or edema bilateral lower extremities   CBC:  Recent Labs Lab 01/27/16 1040 01/28/16 0421 01/29/16 0436  WBC 8.3 7.6 7.4  HGB 15.2  14.0 14.2  HCT 47.1 43.2 44.7  MCV 91.6 90.9 91.4  PLT 255 281 281   Basic Metabolic Panel:  Recent Labs Lab 01/27/16 1040 01/28/16 0421  NA 138 136  K 4.5 4.5  CL 112* 109  CO2 18* 21*  GLUCOSE 88 109*  BUN 17 13  CREATININE 0.92 1.19  CALCIUM 9.5 8.8*   GFR: Estimated Creatinine Clearance: 96.5 mL/min (by C-G formula based on Cr of 1.19).  Cardiac Enzymes:  Recent Labs Lab  01/27/16 1815 01/28/16 0421  TROPONINI <0.03 <0.03    Recent Results (from the past 240 hour(s))  MRSA PCR Screening     Status: None   Collection Time: 01/27/16  6:24 PM  Result Value Ref Range Status   MRSA by PCR NEGATIVE NEGATIVE Final    Comment:        The GeneXpert MRSA Assay (FDA approved for NASAL specimens only), is one component of a comprehensive MRSA colonization surveillance program. It is not intended to diagnose MRSA infection nor to guide or monitor treatment for MRSA infections.      Radiology Studies: Ct Angio Chest Pe W/cm &/or Wo Cm  01/27/2016  CLINICAL DATA:  Chest pain and shortness of breath since this morning. History of pulmonary embolism. Patient on anticoagulant therapy. EXAM: CT ANGIOGRAPHY CHEST WITH CONTRAST TECHNIQUE: Multidetector CT imaging of the chest was performed using the standard protocol during bolus administration of intravenous contrast. Multiplanar CT image reconstructions and MIPs were obtained to evaluate the vascular anatomy. CONTRAST:  80 mL Isovue 370 IV COMPARISON:  CT 02/25/2015 FINDINGS: Mediastinum/Lymph Nodes: Heart is normal size. Thoracic aorta is within normal. Pulmonary arteries are well opacified as there is a focal filling defect over a proximal right lower lobar pulmonary artery compatible with embolus. No left-sided emboli are visualized. RV/LV ratio is 49.6/50.0 = 0.99. Ratio is greater than 0.9 and compatible with mild right heart strain/ intermediate risk pulmonary embolism. No evidence of mediastinal or hilar adenopathy. Remaining mediastinal structures are within normal. Lungs/Pleura: No pulmonary mass, infiltrate, or effusion. Airways are normal. Upper abdomen: Stable left liver cyst. Musculoskeletal: Mild degenerate change of the spine. Review of the MIP images confirms the above findings. IMPRESSION: Small burden of pulmonary embolism over a right lower lobar pulmonary artery. Positive for acute PE with CT evidence of  right heart strain (RV/LV Ratio = 0.99) consistent with at least submassive (intermediate risk) PE. The presence of right heart strain has been associated with an increased risk of morbidity and mortality. Please activate Code PE by paging [phone]. Left lobe liver cyst. Critical Value/emergent results were called by telephone at the time of interpretation on 01/27/2016 at 1:44 pm to Dr. Frederick Peersachel Little, who verbally acknowledged these results. Electronically Signed   By: Elberta Fortisaniel  Boyle M.D.   On: 01/27/2016 13:44   Scheduled Meds: . atenolol  100 mg Oral BID  . atorvastatin  80 mg Oral Daily  . flecainide  100 mg Oral Q12H  . gabapentin  100 mg Oral TID  . morphine  90 mg Oral Q12H  . oxyCODONE  30 mg Oral Q6H  . temazepam  30 mg Oral QHS  . venlafaxine XR  75 mg Oral BID   Continuous Infusions: . heparin 1,750 Units/hr (01/29/16 0242)     LOS: 2 days    Time spent: 35 minutes  Lonia BloodJeffrey T. McClung, MD [facility] Office  [phone] Pager - Text Page per Loretha StaplerAmion as per below:  On-Call/Text Page:  [email or web address]      password [email or web address]  If 7PM-7AM, please contact night-coverage [email or web address] Password [email or web address] 01/29/2016, 11:30 AM

## 2016-01-30 DIAGNOSIS — R0602 Shortness of breath: Secondary | ICD-10-CM

## 2016-01-30 DIAGNOSIS — Z79899 Other long term (current) drug therapy: Secondary | ICD-10-CM

## 2016-01-30 DIAGNOSIS — I2699 Other pulmonary embolism without acute cor pulmonale: Secondary | ICD-10-CM | POA: Diagnosis present

## 2016-01-30 LAB — CBC
HEMATOCRIT: 43.3 % (ref 39.0–52.0)
Hemoglobin: 13.7 g/dL (ref 13.0–17.0)
MCH: 29 pg (ref 26.0–34.0)
MCHC: 31.6 g/dL (ref 30.0–36.0)
MCV: 91.7 fL (ref 78.0–100.0)
PLATELETS: 334 10*3/uL (ref 150–400)
RBC: 4.72 MIL/uL (ref 4.22–5.81)
RDW: 15 % (ref 11.5–15.5)
WBC: 8.8 10*3/uL (ref 4.0–10.5)

## 2016-01-30 MED ORDER — APIXABAN 5 MG PO TABS: 5.0000 mg | ORAL_TABLET | Freq: Two times a day (BID) | ORAL | Status: DC

## 2016-01-30 MED ORDER — APIXABAN 5 MG PO TABS: 10.0000 mg | ORAL_TABLET | Freq: Two times a day (BID) | ORAL | Status: DC

## 2016-01-30 NOTE — Progress Notes (Signed)
   01/30/16 1000  Clinical Encounter Type  Visited With Patient  Visit Type Initial  Spiritual Encounters  Spiritual Needs Emotional  Stress Factors  Patient Stress Factors Financial concerns;Health changes;Loss of control  Family Stress Factors Financial concerns   Chaplain visited with patient. Patient concerned with results from social security. Patient worried about financial concerns.  Chaplain offered ministry of presence and listened to patient.  Patient hopes to be discharged today to home. Chaplain advised patient to take it easy at home and rest.  Craig FloridaLisa M [facility] Bridge[facility] 01/30/2016 10:10 AM

## 2016-01-30 NOTE — Progress Notes (Signed)
SATURATION QUALIFICATIONS: (This note is used to comply with regulatory documentation for home oxygen)  Patient Saturations on Room Air at Rest = 97%  Patient Saturations on Room Air while Ambulating = 92%  Patient Saturations on 0 Liters of oxygen while Ambulating = 92%  Please briefly explain why patient needs home oxygen: no Home o2 needs

## 2016-01-30 NOTE — Progress Notes (Signed)
MD made aware that patient stated he does not have enough pain meds to last until his follow up appt. MD advises that patient must speak with outpatient MD for refills of his pain meds. This info was given to patient and he verbalized understanding.

## 2016-01-30 NOTE — Discharge Summary (Signed)
Physician Discharge Summary  [name] [id number] DOB: Jul 25, 1956 DOA: 01/27/2016  PCP: Beckie Salts, MD  Admit date: 01/27/2016 Discharge date: 01/30/2016  Time spent: 35 minutes  Recommendations for Outpatient Follow-up:  Acute PE with right heart strain/DVT  -Patient has been noncompliant with his Eliquis -Counseled patient and wife that failure to comply with treatment can result in death -Eliquis starter kit -Lower extremity Doppler; negative for DVT  -Echocardiogram: Normal  -Follow-up with PCP Dr. Beckie Salts in 1-2 weeks acute PE with right heart strain, previous DVT, chronic diastolic CHF SATURATION QUALIFICATIONS: (This note is used to comply with regulatory documentation for home oxygen) Patient Saturations on Room Air at Rest = 97% Patient Saturations on Room Air while Ambulating = 92% Patient Saturations on 0 Liters of oxygen while Ambulating = 92% Does not meet guideline for home O2.  Chronic Diastolic CHF  -Compensated. Echo done September 2016 reveals EF of 55-60% and grade 1 diastolic dysfunction -7/4 echocardiogram: Normal see results below -Strict in and out since admission -2.1 L -Daily weight Filed Weights   01/27/16 1715 01/28/16 0658  Weight: 120.7 kg (266 lb 1.5 oz) 122.2 kg (269 lb 6.4 oz)  -Atenolol100 mg BID  -Lipitor 80 mg daily   Hypertension. -See chronic diastolic CHF  Chest pain. - Likely related to acute PE  -Resolved  Dysrhythmia:  -Hx Paroxysmal A. Fib and paroxysmal SVT.  -Currently in NSR   Chronic venous insufficiency.  -Stable at baseline  Chronic pain syndrome.  -Continue home pain management regimen   Discharge Diagnoses:  Principal Problem:   PE (pulmonary embolism) Active Problems:   Chronic pain syndrome   Chronic use of opiate drugs therapeutic purposes   Chest pain   Essential hypertension   HLD (hyperlipidemia)   Chronic diastolic congestive heart failure (HCC)   Chronic venous insufficiency    Dysrhythmia   Acute pulmonary embolism (HCC)   SOB (shortness of breath)   Discharge Condition: Stable  Diet recommendation: Heart healthy  Filed Weights   01/27/16 1715 01/28/16 0658  Weight: 120.7 kg (266 lb 1.5 oz) 122.2 kg (269 lb 6.4 oz)    History of present illness:  60 y.o. WM PMHx Depression, HTN, SVT, Chronic Diastolic CHF, Atrial Fibrillation, Chronic PVD, Osteomyelitis Lt wrist/Rt foot, MSSA Bacteremia, DVT   Presents to emergency department with a chief complaint of sudden onset chest pain. Initial evaluation reveals pulmonary embolism.  Information is obtained from the patient. He reports being in his usual state of health until this morning developed sudden onset chest pain. He describes the pain as sharp constant located substernal worse with inspiration. Pain is nonradiating. He denies shortness of breath diaphoresis nausea vomiting. He denies hemoptysis, headache dizziness syncope or near-syncope. He denies lower extremity edema or orthopnea. He denies abdominal pain dysuria hematuria frequency or urgency. He denies diarrhea melena bright red blood per rectum. He denies any recent travel or sick contacts. He denies fever chills cough. During his hospitalization patient was treated for acute PE with right heart strain, secondary to noncompliance with Eliquis for previous DVT. Patient was restarted on Eliquis, currently stable for discharge.    Procedures: 7/3 CT angiogram chest PE protocol;-Small burden PE RLL pulmonary artery. -Positive Acute PE with CT evidence of right heart strain (RV/LV Ratio = 0.99) C/W submassive (intermediate risk) PE. 7/4 bilateral lower extremity Doppler; negative for DVT 7/4 echocardiogram; LVEF;-55% to 65%.   Consultations: Dr. Elsworth [facility] CM    Discharge Exam: Filed Vitals:  01/30/16 0700 01/30/16 0800 01/30/16 0900 01/30/16 1000  BP: 86/69 87/64 120/86 105/70  Pulse: 42 62 60 63  Temp:  98.1 F (36.7 C)    TempSrc:  Oral     Resp: _0 Height:      Weight:      SpO2: 96% 99% 95% 97%    General: A/O 4, NAD, No acute respiratory distress Eyes: negative scleral hemorrhage, negative anisocoria, negative icterus ENT: Negative Runny nose, negative gingival bleeding, Neck: Negative scars, masses, torticollis, lymphadenopathy, JVD Lungs: Clear to auscultation bilaterally without wheezes or crackles Cardiovascular: Regular rate and rhythm without murmur gallop or rub normal S1 and S2   Discharge Instructions     Medication List    STOP taking these medications        DULoxetine 30 MG capsule  Commonly known as:  CYMBALTA     methocarbamol 500 MG tablet  Commonly known as:  ROBAXIN     Metoprolol Tartrate 75 MG Tabs     oxyCODONE-acetaminophen 5-325 MG tablet  Commonly known as:  PERCOCET      TAKE these medications        apixaban 5 MG Tabs tablet  Commonly known as:  ELIQUIS  Take 2 tablets (10 mg total) by mouth 2 (two) times daily. For 7 days. Then switch to 5 mg by mouth twice a day     apixaban 5 MG Tabs tablet  Commonly known as:  ELIQUIS  Take 1 tablet (5 mg total) by mouth 2 (two) times daily.  Start taking on:  02/05/2016     atenolol 100 MG tablet  Commonly known as:  TENORMIN  Take 100 mg by mouth 2 (two) times daily.     atorvastatin 80 MG tablet  Commonly known as:  LIPITOR  Take 1 tablet (80 mg total) by mouth daily.     docusate sodium 100 MG capsule  Commonly known as:  COLACE  Take 1 capsule (100 mg total) by mouth 2 (two) times daily.     famciclovir 500 MG tablet  Commonly known as:  FAMVIR  Take 500 mg by mouth 2 (two) times daily as needed. ulcers     flecainide 100 MG tablet  Commonly known as:  TAMBOCOR  Take 1 tablet (100 mg total) by mouth every 12 (twelve) hours.     gabapentin 100 MG capsule  Commonly known as:  NEURONTIN  Take 1 capsule (100 mg total) by mouth 3 (three) times daily.     morphine 30 MG 12 hr tablet  Commonly known as:  MS  CONTIN  Take 3 tablets (90 mg total) by mouth every 12 (twelve) hours.     morphine 60 MG 12 hr tablet  Commonly known as:  MS CONTIN  Take 60 mg by mouth 2 (two) times daily.     multivitamin with minerals Tabs tablet  Take 1 tablet by mouth daily.     naloxegol oxalate 25 MG Tabs tablet  Commonly known as:  MOVANTIK  Take 1 tablet (25 mg total) by mouth daily before breakfast.     ondansetron 8 MG tablet  Commonly known as:  ZOFRAN  Take 8 mg by mouth every 8 (eight) hours as needed. nausea     oxycodone 30 MG immediate release tablet  Commonly known as:  ROXICODONE  Take 1 tablet (30 mg total) by mouth every 4 (four) hours as needed for moderate pain.     polyethylene glycol packet  Commonly known as:  MIRALAX / GLYCOLAX  Take 17 g by mouth daily.     PROAIR HFA 108 (90 Base) MCG/ACT inhaler  Generic drug:  albuterol  Inhale 1 puff into the lungs every 6 (six) hours as needed for wheezing or shortness of breath.     SUMAtriptan 50 MG tablet  Commonly known as:  IMITREX  Take 50 mg by mouth daily as needed for migraine or headache. Maximum 2 doses in 2 days     temazepam 30 MG capsule  Commonly known as:  RESTORIL  Take 30 mg by mouth at bedtime.     testosterone cypionate 200 MG/ML injection  Commonly known as:  DEPOTESTOSTERONE CYPIONATE  Inject 100 mg into the muscle every 14 (fourteen) days. Reported on 08/05/2015     trifluridine 1 % ophthalmic solution  Commonly known as:  VIROPTIC  Place 1 drop into both eyes 2 (two) times daily as needed (breakouts (blurry, matted eyelids)).     venlafaxine XR 75 MG 24 hr capsule  Commonly known as:  EFFEXOR-XR  Take 1 capsule (75 mg total) by mouth daily with breakfast.       Allergies  Allergen Reactions  . Verapamil Other (See Comments)    Causes pvc's  . Imitrex [Sumatriptan] Other (See Comments)    Chest pain, 2004 (tolerates 50 mg prn)  . Duloxetine Hcl     Psychosis   Follow-up Information    Follow up with  Beckie Salts, MD. Schedule an appointment as soon as possible for a visit in 1 week.   Specialty:  Internal Medicine   Why:  Follow-up with PCP Dr. Beckie Salts in 1-2 weeks acute PE with right heart strain, previous DVT, chronic diastolic CHF   Contact information:   [address] [place] [address] [phone]        The results of significant diagnostics from this hospitalization (including imaging, microbiology, ancillary and laboratory) are listed below for reference.    Significant Diagnostic Studies: Dg Chest 2 View  01/27/2016  CLINICAL DATA:  Chest pain, shortness of Breath EXAM: CHEST  2 VIEW COMPARISON:  08/04/2015 FINDINGS: Cardiomediastinal silhouette is stable. No acute infiltrate or pleural effusion. No pulmonary edema. Bony thorax is unremarkable. IMPRESSION: No active cardiopulmonary disease. Electronically Signed   By: Lahoma Crocker M.D.   On: 01/27/2016 11:31   Ct Angio Chest Pe W/cm &/or Wo Cm  01/27/2016  CLINICAL DATA:  Chest pain and shortness of breath since this morning. History of pulmonary embolism. Patient on anticoagulant therapy. EXAM: CT ANGIOGRAPHY CHEST WITH CONTRAST TECHNIQUE: Multidetector CT imaging of the chest was performed using the standard protocol during bolus administration of intravenous contrast. Multiplanar CT image reconstructions and MIPs were obtained to evaluate the vascular anatomy. CONTRAST:  80 mL Isovue 370 IV COMPARISON:  CT 02/25/2015 FINDINGS: Mediastinum/Lymph Nodes: Heart is normal size. Thoracic aorta is within normal. Pulmonary arteries are well opacified as there is a focal filling defect over a proximal right lower lobar pulmonary artery compatible with embolus. No left-sided emboli are visualized. RV/LV ratio is 49.6/50.0 = 0.99. Ratio is greater than 0.9 and compatible with mild right heart strain/ intermediate risk pulmonary embolism. No evidence of mediastinal or hilar adenopathy. Remaining mediastinal structures are  within normal. Lungs/Pleura: No pulmonary mass, infiltrate, or effusion. Airways are normal. Upper abdomen: Stable left liver cyst. Musculoskeletal: Mild degenerate change of the spine. Review of the MIP images confirms the above findings. IMPRESSION: Small burden  of pulmonary embolism over a right lower lobar pulmonary artery. Positive for acute PE with CT evidence of right heart strain (RV/LV Ratio = 0.99) consistent with at least submassive (intermediate risk) PE. The presence of right heart strain has been associated with an increased risk of morbidity and mortality. Please activate Code PE by paging [phone]. Left lobe liver cyst. Critical Value/emergent results were called by telephone at the time of interpretation on 01/27/2016 at 1:44 pm to Dr. Theotis Burrow, who verbally acknowledged these results. Electronically Signed   By: Marin Olp M.D.   On: 01/27/2016 13:44    Microbiology: Recent Results (from the past 240 hour(s))  MRSA PCR Screening     Status: None   Collection Time: 01/27/16  6:24 PM  Result Value Ref Range Status   MRSA by PCR NEGATIVE NEGATIVE Final    Comment:        The GeneXpert MRSA Assay (FDA approved for NASAL specimens only), is one component of a comprehensive MRSA colonization surveillance program. It is not intended to diagnose MRSA infection nor to guide or monitor treatment for MRSA infections.      Labs: Basic Metabolic Panel:  Recent Labs Lab 01/27/16 1040 01/28/16 0421  NA 138 136  K 4.5 4.5  CL 112* 109  CO2 18* 21*  GLUCOSE 88 109*  BUN 17 13  CREATININE 0.92 1.19  CALCIUM 9.5 8.8*   Liver Function Tests: No results for input(s): AST, ALT, ALKPHOS, BILITOT, PROT, ALBUMIN in the last 168 hours. No results for input(s): LIPASE, AMYLASE in the last 168 hours. No results for input(s): AMMONIA in the last 168 hours. CBC:  Recent Labs Lab 01/27/16 1040 01/28/16 0421 01/29/16 0436 01/30/16 0415  WBC 8.3 7.6 7.4 8.8  HGB 15.2  14.0 14.2 13.7  HCT 47.1 43.2 44.7 43.3  MCV 91.6 90.9 91.4 91.7  PLT 255 281 281 334   Cardiac Enzymes:  Recent Labs Lab 01/27/16 1815 01/28/16 0421  TROPONINI <0.03 <0.03   BNP: BNP (last 3 results)  Recent Labs  07/05/15 0140 01/27/16 1040  BNP 20.4 9.1    ProBNP (last 3 results) No results for input(s): PROBNP in the last 8760 hours.  CBG: No results for input(s): GLUCAP in the last 168 hours.     Signed:  Dia Crawford, MD [facility] [phone] pager

## 2016-02-17 IMAGING — CR DG ABD PORTABLE 1V
1 series · 1 of 1 positions shown · non-contrast
Comparison: Chest 04/26/2015

CLINICAL DATA: Abdominal pain this morning. History of
constipation. Pressure.

EXAM:
PORTABLE ABDOMEN - 1 VIEW

[AP]
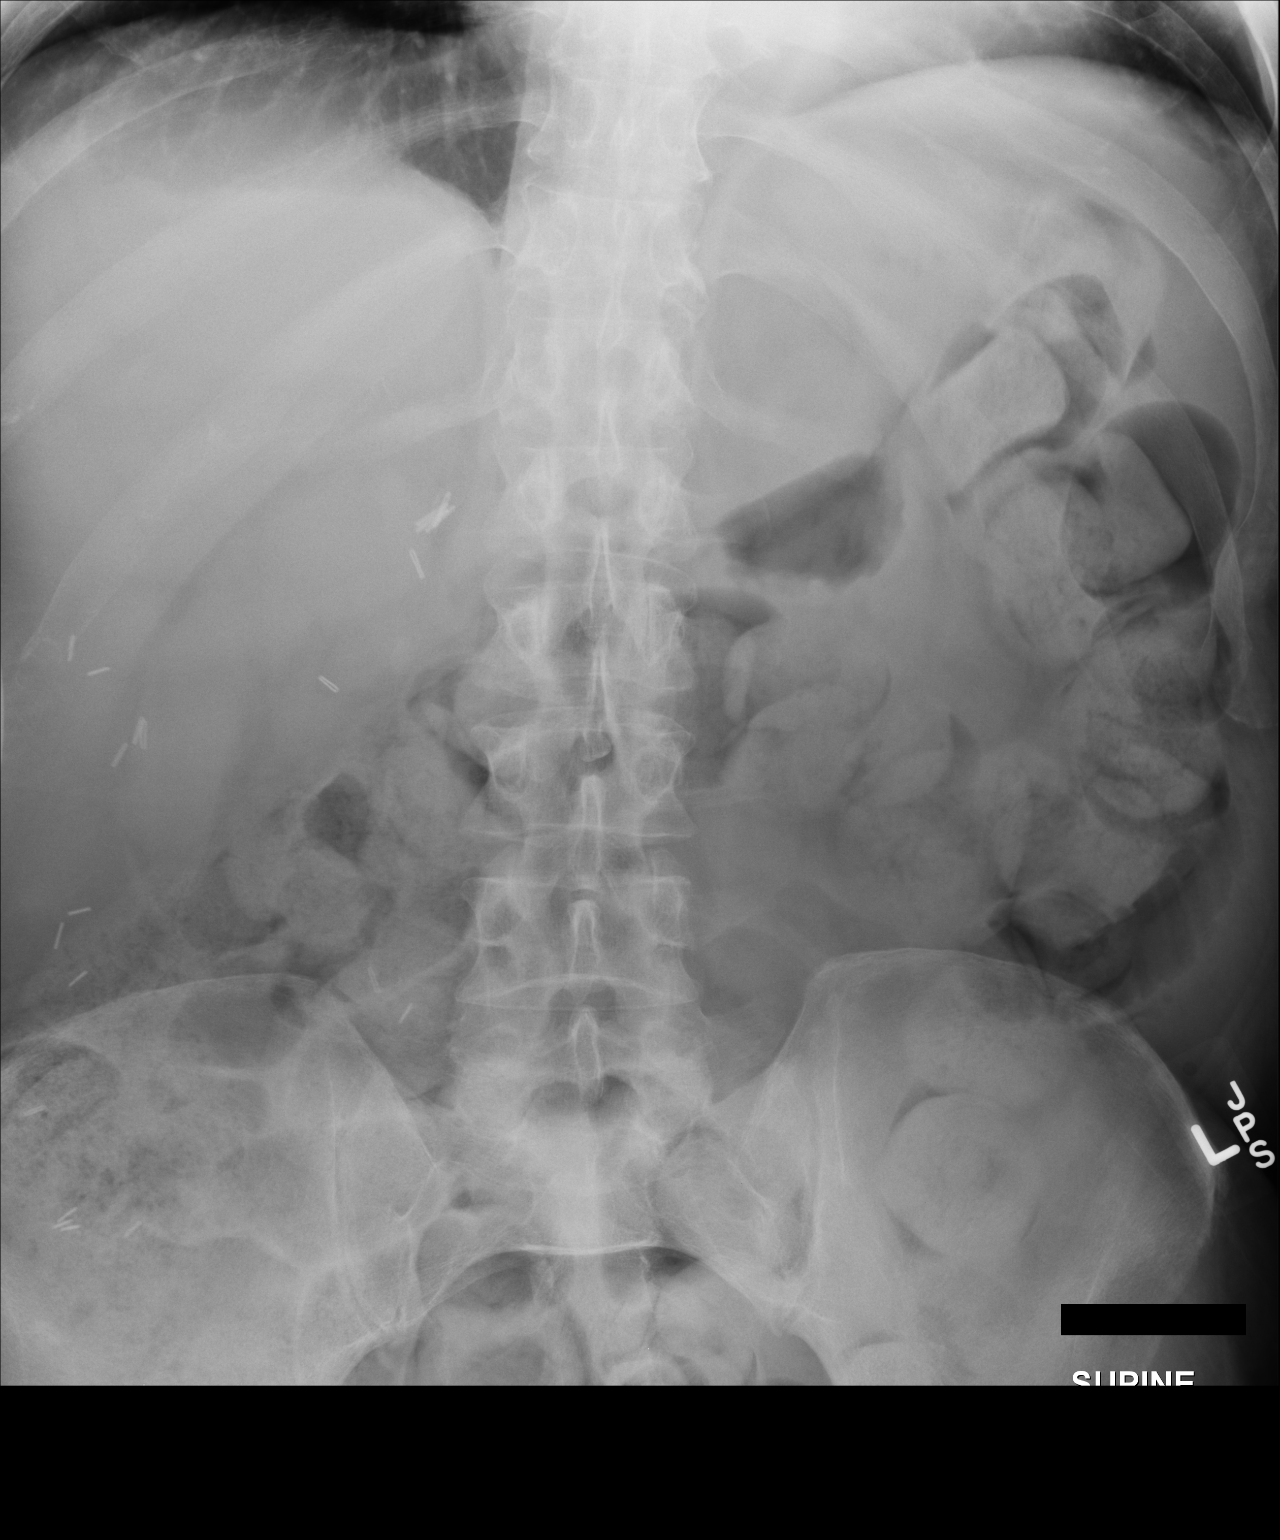

[1 of 1 positions shown; findings below may reference images not displayed]

FINDINGS: Stool-filled colon with scattered gas. No small or large bowel
distention. No radiopaque stones. Surgical clips in the right
abdomen. Visualized bones appear intact.
IMPRESSION: Normal nonobstructive bowel gas pattern with diffusely stool-filled
colon. Findings may indicate constipation.

## 2016-04-04 ENCOUNTER — Encounter (HOSPITAL_BASED_OUTPATIENT_CLINIC_OR_DEPARTMENT_OTHER): Payer: Self-pay | Admitting: Adult Health

## 2016-04-04 ENCOUNTER — Emergency Department (HOSPITAL_BASED_OUTPATIENT_CLINIC_OR_DEPARTMENT_OTHER)
Admission: EM | Admit: 2016-04-04 | Discharge: 2016-04-04 | Disposition: A | Discharge status: Home / Self Care | Payer: Medicaid Other | Attending: Emergency Medicine | Admitting: Emergency Medicine

## 2016-04-04 DIAGNOSIS — I110 Hypertensive heart disease with heart failure: Secondary | ICD-10-CM | POA: Diagnosis not present

## 2016-04-04 DIAGNOSIS — I5032 Chronic diastolic (congestive) heart failure: Secondary | ICD-10-CM | POA: Diagnosis not present

## 2016-04-04 DIAGNOSIS — M170 Bilateral primary osteoarthritis of knee: Secondary | ICD-10-CM | POA: Insufficient documentation

## 2016-04-04 DIAGNOSIS — Z79899 Other long term (current) drug therapy: Secondary | ICD-10-CM | POA: Insufficient documentation

## 2016-04-04 DIAGNOSIS — Z7289 Other problems related to lifestyle: Secondary | ICD-10-CM | POA: Insufficient documentation

## 2016-04-04 DIAGNOSIS — Z87891 Personal history of nicotine dependence: Secondary | ICD-10-CM | POA: Insufficient documentation

## 2016-04-04 DIAGNOSIS — M25562 Pain in left knee: Secondary | ICD-10-CM | POA: Diagnosis present

## 2016-04-04 DIAGNOSIS — Z765 Malingerer [conscious simulation]: Secondary | ICD-10-CM

## 2016-04-04 DIAGNOSIS — G8929 Other chronic pain: Secondary | ICD-10-CM | POA: Diagnosis not present

## 2016-04-04 NOTE — ED Triage Notes (Signed)
Presents with generalized chronic pain, out of pain medication for 2 days, unable to get them refilled until Monday. Takes 180 mg of morphine XR a day and 30 mg oxycodone 4 times a day.

## 2016-04-04 NOTE — ED Notes (Signed)
EDP at [facility] 

## 2016-04-04 NOTE — ED Provider Notes (Signed)
[facility] Provider Note   CSN: [id number] Arrival date & time: 04/04/16  2150  By signing my name below, I, Nelwyn Salisbury, attest that this documentation has been prepared under the direction and in the presence of Lyndal Pulley, MD . Electronically Signed: Nelwyn Salisbury, Scribe. 04/04/2016. 10:04 PM.  History   Chief Complaint Chief Complaint  Patient presents with  . Pain   The history is provided by the patient. No language interpreter was used.    HPI Comments:  [name] is a 60 y.o. male with PMHx of HTN, HLD, and Osteoarthritis who presents to the Emergency Department complaining of chronic constant diffuse joint pain. Pt describes the pain as worse in his knees. Pt reports that he is followed by pain management but has recently run out of his medication.   Past Medical History:  Diagnosis Date  . Anxiety   . Atrial fibrillation (HCC) dx'd 04/2015  . Chronic bronchitis (HCC)    "used to get it q yr; nothing in the last ~ 5 yrs" (08/15/2015)  . Chronic knee pain    "both sides"  . Chronic left shoulder pain   . Depression   . 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 GERD (gastroesophageal reflux disease)   . Hyperlipemia   . Hypertension   . Migraine    "q 3 months" (08/15/2015)  . Osteoarthritis of both knees   . Peripheral vascular disease (HCC)   . PUD (peptic ulcer disease)   . Wrist osteomyelitis, left (HCC) 06/05/2015    Patient Active Problem List   Diagnosis Date Noted  . Acute pulmonary embolism (HCC)   . SOB (shortness of breath)   . PE (pulmonary embolism) 01/27/2016  . Pulmonary embolism (HCC) 01/27/2016  . Dysrhythmia 01/27/2016  . Chronic venous insufficiency 01/17/2016  . Abnormality of gait 08/21/2015  . Fracture of left tibia 08/21/2015  . HLD (hyperlipidemia)   . Chronic diastolic congestive heart failure (HCC)   . Depression   . Post-operative pain   . Essential hypertension   . Paroxysmal SVT (supraventricular  tachycardia) (HCC)   . Osteomyelitis of left wrist (HCC)   . History of amputation of lesser toe of right foot (HCC)   . Fracture of shaft of left tibia 08/15/2015  . Fracture, tibia, shaft 08/15/2015  . Acute osteomyelitis of right radius (HCC)   . Osteomyelitis (HCC) 07/05/2015  . Paroxysmal atrial fibrillation (HCC)   . 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 Wound of right ankle 06/05/2015  . Status post amputation of great toe (HCC)   . Pain   . Swelling   . Warmth of joint   . Mycotic aneurysm   . Thigh abscess   . Hand abscess   . Septic embolism (HCC)   . Gangrene of toe (HCC)   . Acute osteomyelitis of left ulna (HCC)   . Streptococcal arthritis of left wrist (HCC)   . Dry gangrene (HCC)   . Wound, open, foot with complication   . Acute deep vein thrombosis (DVT) of distal end of right lower extremity (HCC)   . Left wrist fracture   . Abscess of bursa, left wrist   . SVT (supraventricular tachycardia) (HCC) 05/08/2015  . 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 Adjustment disorder with depressed mood   . 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 Debility 05/01/2015  . Sepsis (HCC) 04/30/2015  . Acute renal failure with tubular  necrosis (HCC)   . Abscess of left hand   . Staphylococcus aureus bacteremia with sepsis (HCC)   . Colles' fracture of left radius   . Cellulitis of left upper extremity   . Diastolic dysfunction   . Nondisplaced fracture of distal end of left radius   . Abscess   . Bacteremia due to Staphylococcus   . Pulmonary hypertension (HCC)   . Acute blood loss anemia   . Aneurysm of right femoral artery (HCC)   . DVT, lower extremity (HCC)   . Radius distal fracture   . Cellulitis of finger of left hand   . Acute pyelonephritis   . Arm pain, anterior   . Cellulitis   . Bacteremia due to Staphylococcus aureus   . Acute renal failure syndrome (HCC)   . Fracture of left  radius   . ARF (acute renal failure) (HCC) 04/07/2015  . Severe sepsis (HCC) 04/07/2015  . Cellulitis of left hand 04/07/2015  . Elevated INR 04/07/2015  . Foot swelling 04/07/2015  . Elevated bilirubin 04/07/2015  . Hypokalemia 04/07/2015  . Hypomagnesemia 04/07/2015  . 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 Bacteremia 04/07/2015  . UTI (lower urinary tract infection)   . Chest pain 11/15/2013  . History of duodenal ulcer 11/15/2013  . 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 Septic shock due to abscess 07/13/2013  . Chronic use of opiate drugs therapeutic purposes 07/12/2013  . Acute renal failure (HCC) 07/11/2013  . Chronic pain syndrome 07/11/2013  . Testosterone deficiency 07/11/2013    Past Surgical History:  Procedure Laterality Date  . AMPUTATION Right 05/17/2015   Procedure: Right 1st and 2nd Toe Amputation;  Surgeon: Nadara Mustard, MD;  Location: [facility] OR;  Service: Orthopedics;  Laterality: Right;  . APPENDECTOMY  ~ 1980  . CARDIAC CATHETERIZATION  1993   "Dr. [place]; for vasospasms"  . CHOLECYSTECTOMY  ~ 1982  . ELBOW ARTHROSCOPY WITH TENDON RECONSTRUCTION Right 1990  . FEMORAL ARTERY EXPLORATION Right 04/11/2015   Procedure: Resection of infected right femoral artery aneurysm;  Surgeon: Fransisco Hertz, MD;  Location: [facility] OR;  Service: Vascular;  Laterality: Right;  . FEMORAL REVISION Right 04/19/2015   Procedure: OVERSEW RIGHT FEMORAL VEIN ;  Surgeon: Chuck Hint, MD;  Location: [facility] OR;  Service: Vascular;  Laterality: Right;  . HEMORRHOIDECTOMY WITH HEMORRHOID BANDING  ~ 1985  . I&D EXTREMITY Left 07/12/2013   Procedure: I&D Left Thigh Abscess;  Surgeon: [name], MD;  Location: [facility] OR;  Service: Orthopedics;  Laterality: Left;  . I&D EXTREMITY Left 04/17/2015   Procedure: IRRIGATION AND DEBRIDEMENT LEFT EXTREMITY;  Surgeon: Fransisco Hertz, MD;  Location: [facility] OR;  Service: Vascular;  Laterality: Left;  . I&D EXTREMITY Left 07/09/2015   Procedure: IRRIGATION AND  DEBRIDEMENT LEFT WRIST;  Surgeon: Knute Neu, MD;  Location: [facility] OR;  Service: Plastics;  Laterality: Left;  . I&D EXTREMITY Left 08/08/2015   Procedure: IRRIGATION AND DEBRIDEMENT EXTREMITY;  Surgeon: Knute Neu, MD;  Location: [facility] OR;  Service: Plastics;  Laterality: Left;  . INCISION AND DRAINAGE ABSCESS Right 04/11/2015   Procedure: INCISION AND DRAINAGE OF RIGHT THIGH ABSCESS;  Surgeon: Fransisco Hertz, MD;  Location: [facility] OR;  Service: Vascular;  Laterality: Right;  . INCISION AND DRAINAGE ABSCESS Right 04/13/2015   Procedure: INCISION AND DRAINAGE THIGH ABSCESS;  Surgeon: Fransisco Hertz, MD;  Location: [facility] OR;  Service: Vascular;  Laterality: Right;  . INGUINAL HERNIA REPAIR Right ~ 1969  . JOINT  REPLACEMENT    . KNEE ARTHROSCOPY Bilateral 1985-2008   "left-right"  . MUSCLE FLAP CLOSURE Right 04/19/2015   Procedure: RIGHT RECTUS ABDOMINUS  FLAP TO RIGHT GROIN;  Surgeon: Glenna Fellows, MD;  Location: [facility] OR;  Service: Plastics;  Laterality: Right;  . ORIF TIBIA FRACTURE Left 08/17/2015   Procedure: OPEN REDUCTION INTERNAL FIXATION (ORIF) TIBIA FRACTURE;  Surgeon: Kathryne Hitch, MD;  Location: [facility] OR;  Service: Orthopedics;  Laterality: Left;  . REPLACEMENT TOTAL KNEE BILATERAL Bilateral ~ 2007-2013"   "left-right"  . SHOULDER ARTHROSCOPY W/ ROTATOR CUFF REPAIR Bilateral 1992-1993   "right-left"  . TONSILLECTOMY  ~ 1972  . UVULOPALATOPLASTY  1991  . WOUND EXPLORATION Right 04/23/2015   Procedure: EXAM UNDER ANESTHESIA AND VAC CHANGE RIGHT THIGH;  Surgeon: Glenna Fellows, MD;  Location: [facility] OR;  Service: Plastics;  Laterality: Right;       Home Medications    Prior to Admission medications   Medication Sig Start Date End Date Taking? Authorizing Provider  dabigatran (PRADAXA) 150 MG CAPS capsule Take 150 mg by mouth 2 (two) times daily.   Yes Historical Provider, MD  albuterol (PROAIR HFA) 108 (90 BASE) MCG/ACT inhaler Inhale 1 puff into the lungs every 6 (six) hours as needed  for wheezing or shortness of breath.  09/06/13   Historical Provider, MD  apixaban (ELIQUIS) 5 MG TABS tablet Take 1 tablet (5 mg total) by mouth 2 (two) times daily. 02/05/16   Drema Dallas, MD  apixaban (ELIQUIS) 5 MG TABS tablet Take 2 tablets (10 mg total) by mouth 2 (two) times daily. For 7 days. Then switch to 5 mg by mouth twice a day 01/30/16   Drema Dallas, MD  atenolol (TENORMIN) 100 MG tablet Take 100 mg by mouth 2 (two) times daily.    Historical Provider, MD  atorvastatin (LIPITOR) 80 MG tablet Take 1 tablet (80 mg total) by mouth daily. 08/30/15   Mcarthur Rossetti Angiulli, PA-C  docusate sodium (COLACE) 100 MG capsule Take 1 capsule (100 mg total) by mouth 2 (two) times daily. Patient not taking: Reported on 01/17/2016 05/24/15   Mcarthur Rossetti Angiulli, PA-C  famciclovir (FAMVIR) 500 MG tablet Take 500 mg by mouth 2 (two) times daily as needed. ulcers 10/25/13   Historical Provider, MD  flecainide (TAMBOCOR) 100 MG tablet Take 1 tablet (100 mg total) by mouth every 12 (twelve) hours. 08/30/15   Mcarthur Rossetti Angiulli, PA-C  gabapentin (NEURONTIN) 100 MG capsule Take 1 capsule (100 mg total) by mouth 3 (three) times daily. 08/30/15   Mcarthur Rossetti Angiulli, PA-C  morphine (MS CONTIN) 30 MG 12 hr tablet Take 3 tablets (90 mg total) by mouth every 12 (twelve) hours. 08/30/15   Mcarthur Rossetti Angiulli, PA-C  morphine (MS CONTIN) 60 MG 12 hr tablet Take 60 mg by mouth 2 (two) times daily. 01/11/16   Historical Provider, MD  Multiple Vitamin (MULTIVITAMIN WITH MINERALS) TABS tablet Take 1 tablet by mouth daily. 05/24/15   Mcarthur Rossetti Angiulli, PA-C  naloxegol oxalate (MOVANTIK) 25 MG TABS tablet Take 1 tablet (25 mg total) by mouth daily before breakfast. Patient taking differently: Take 25 mg by mouth daily as needed (open induced constipation).  05/24/15   Mcarthur Rossetti Angiulli, PA-C  ondansetron (ZOFRAN) 8 MG tablet Take 8 mg by mouth every 8 (eight) hours as needed. nausea 12/18/15   Historical Provider, MD  oxyCODONE (ROXICODONE) 30  MG immediate release tablet Take 1 tablet (30 mg total) by mouth every 4 (  four) hours as needed for moderate pain. Patient taking differently: Take 30 mg by mouth every 6 (six) hours.  08/30/15   Mcarthur Rossetti Angiulli, PA-C  polyethylene glycol (MIRALAX / GLYCOLAX) packet Take 17 g by mouth daily. Patient taking differently: Take 17 g by mouth daily as needed for mild constipation.  08/09/15   Shanker Levora Dredge, MD  SUMAtriptan (IMITREX) 50 MG tablet Take 50 mg by mouth daily as needed for migraine or headache. Maximum 2 doses in 2 days 02/15/15   Historical Provider, MD  temazepam (RESTORIL) 30 MG capsule Take 30 mg by mouth at bedtime. 01/24/16   Historical Provider, MD  testosterone cypionate (DEPOTESTOSTERONE CYPIONATE) 200 MG/ML injection Inject 100 mg into the muscle every 14 (fourteen) days. Reported on 08/05/2015 06/25/15   Historical Provider, MD  trifluridine (VIROPTIC) 1 % ophthalmic solution Place 1 drop into both eyes 2 (two) times daily as needed (breakouts (blurry, matted eyelids)).    Historical Provider, MD  venlafaxine XR (EFFEXOR-XR) 75 MG 24 hr capsule Take 1 capsule (75 mg total) by mouth daily with breakfast. Patient taking differently: Take 75 mg by mouth 2 (two) times daily.  08/30/15   Mcarthur Rossetti Angiulli, PA-C    Family History Family History  Problem Relation Age of Onset  . Heart attack Paternal Grandmother 43  . Heart disease Mother 65    arrythmia  . Stroke Paternal Grandfather   . Stroke Maternal Grandfather     Social History Social History  Substance Use Topics  . Smoking status: Former Smoker    Packs/day: 1.00    Years: 3.00    Types: Cigarettes    Quit date: 01/17/1971  . Smokeless tobacco: Never Used     Comment: "quit smoking cigarettes in 1979  . Alcohol use No     Allergies   Verapamil; Imitrex [sumatriptan]; and Duloxetine hcl   Review of Systems Review of Systems  Musculoskeletal: Positive for arthralgias.  All other systems reviewed and are  negative.    Physical Exam Updated Vital Signs BP 138/96 (BP Location: Right Arm)   Pulse 113   Temp 97.6 F (36.4 C) (Oral)   Resp 20   Ht 6\' 3"  (1.905 m)   Wt 260 lb (117.9 kg)   SpO2 98%   BMI 32.50 kg/m   Physical Exam  Constitutional: He appears well-developed and well-nourished.  HENT:  Head: Normocephalic and atraumatic.  Eyes: Conjunctivae are normal.  Neck: Neck supple.  Cardiovascular: Normal rate and regular rhythm.   No murmur heard. Pulmonary/Chest: Effort normal and breath sounds normal. No respiratory distress.  Abdominal: Soft. There is no tenderness.  Musculoskeletal: He exhibits no edema.  Neurological: He is alert.  Skin: Skin is warm and dry.  Psychiatric: He has a normal mood and affect.  Nursing note and vitals reviewed.    ED Treatments / Results  DIAGNOSTIC STUDIES:  Oxygen Saturation is 98% on RA, normal by my interpretation.    COORDINATION OF CARE:  10:05 PM Discussed treatment plan with pt at bedside and pt agreed to plan.  Labs (all labs ordered are listed, but only abnormal results are displayed) Labs Reviewed - No data to display  EKG  EKG Interpretation None       Radiology No results found.  Procedures Procedures (including critical care time)  Medications Ordered in ED Medications - No data to display   Initial Impression / Assessment and Plan / ED Course  I have reviewed the triage vital signs  and the nursing notes.  Pertinent labs & imaging results that were available during my care of the patient were reviewed by me and considered in my medical decision making (see chart for details).  Clinical Course    60 y.o. male presents with Chronic pain syndrome and being out of his extended release morphine tablets and oxycodone tablets. He filled them last on August 12 and 15th which means they have been finished 6 days early at the least. He explains that he ran out of medication because other people had been  diverting from in his household. He states that he was given extra pain medication here before when he ran out. I explained that recurrent visits for refilling narcotic pain medications which have been used too early is an inappropriate use of the emergency department. The patient stated he has barely been able to walk secondary to pain and after being told he would not be given a prescription and being offered clonidine to help with withdrawal symptoms he stood up off the end of the bed and swiftly walked out of the emergency department under his own strength.  Final Clinical Impressions(s) / ED Diagnoses   Final diagnoses:  Chronic pain  Drug-seeking behavior    New Prescriptions Discharge Medication List as of 04/04/2016 10:06 PM    I personally performed the services described in this documentation, which was scribed in my presence. The recorded information has been reviewed and is accurate.      Lyndal Pulleyaniel Galileah Piggee, MD 04/04/16 [phone]

## 2016-04-04 NOTE — ED Notes (Addendum)
Pt seen by EDP prior to RN assessment, see MD notes, pt d/c'd, care assumed at time of d/c, pt leaving after speaking with EDP, unable to meet/ assess pt. Pt did not want to wait on paperwork or give information at registration window. Leaving upset.

## 2016-04-04 NOTE — ED Notes (Addendum)
Back to room in w/c, alert, NAD, calm, interactive, resps e/u, no dyspnea noted, no obvious discomfort noted[facility]

## 2016-04-05 ENCOUNTER — Emergency Department (HOSPITAL_COMMUNITY)
Admission: EM | Admit: 2016-04-05 | Discharge: 2016-04-05 | Disposition: A | Discharge status: Home / Self Care | Payer: Medicaid Other | Attending: Emergency Medicine | Admitting: Emergency Medicine

## 2016-04-05 ENCOUNTER — Encounter (HOSPITAL_COMMUNITY): Payer: Self-pay | Admitting: Emergency Medicine

## 2016-04-05 DIAGNOSIS — I110 Hypertensive heart disease with heart failure: Secondary | ICD-10-CM | POA: Insufficient documentation

## 2016-04-05 DIAGNOSIS — M25561 Pain in right knee: Secondary | ICD-10-CM | POA: Insufficient documentation

## 2016-04-05 DIAGNOSIS — G8929 Other chronic pain: Secondary | ICD-10-CM | POA: Insufficient documentation

## 2016-04-05 DIAGNOSIS — Z79899 Other long term (current) drug therapy: Secondary | ICD-10-CM | POA: Diagnosis not present

## 2016-04-05 DIAGNOSIS — Z87891 Personal history of nicotine dependence: Secondary | ICD-10-CM | POA: Insufficient documentation

## 2016-04-05 DIAGNOSIS — I5032 Chronic diastolic (congestive) heart failure: Secondary | ICD-10-CM | POA: Diagnosis not present

## 2016-04-05 DIAGNOSIS — M25562 Pain in left knee: Secondary | ICD-10-CM | POA: Diagnosis present

## 2016-04-05 MED ORDER — MORPHINE SULFATE ER 30 MG PO TBCR
60.0000 mg | EXTENDED_RELEASE_TABLET | Freq: Two times a day (BID) | ORAL | Status: DC
  Administered 2016-04-05: 60 mg via ORAL
  Filled 2016-04-05: qty 4

## 2016-04-05 NOTE — ED Notes (Signed)
Patient came out to nurse's desk to use phone at desk to try calling wife with.  Patient states that line is ringing busy.

## 2016-04-05 NOTE — ED Provider Notes (Signed)
[facility]-EMERGENCY DEPT Provider Note   CSN: [id number] Arrival date & time: 04/05/16  0905     History   Chief Complaint Chief Complaint  Patient presents with  . needs pain med refill  . Knee Pain    HPI [name] is a 60 y.o. male.  HPI  History of 2 knee replacements, has had severe pain since then. 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  Also arthritis in left shoulder.  Can't find comfortable position.  11 years of pain like this.  Sees Dr. Genella Mech in [facility] for pain control.  Has appointment on Monday but could not wait to see him as pain severe.  Pain is 8/10, in both knees and left shoulder.    Had been taking oxycodone and morphine for pain and ran out 2 days ago.  Has not been taking anything else for it. Can't take NSAIDS given on pradaxa. Has not tried tylenol.   Past Medical History:  Diagnosis Date  . Anxiety   . Atrial fibrillation (HCC) dx'd 04/2015  . Chronic bronchitis (HCC)    "used to get it q yr; nothing in the last ~ 5 yrs" (08/15/2015)  . Chronic knee pain    "both sides"  . Chronic left shoulder pain   . Depression   . 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 GERD (gastroesophageal reflux disease)   . Hyperlipemia   . Hypertension   . Migraine    "q 3 months" (08/15/2015)  . Osteoarthritis of both knees   . Peripheral vascular disease (HCC)   . PUD (peptic ulcer disease)   . Wrist osteomyelitis, left (HCC) 06/05/2015    Patient Active Problem List   Diagnosis Date Noted  . Drug-seeking behavior 04/04/2016  . Acute pulmonary embolism (HCC)   . SOB (shortness of breath)   . PE (pulmonary embolism) 01/27/2016  . Pulmonary embolism (HCC) 01/27/2016  . Dysrhythmia 01/27/2016  . Chronic venous insufficiency 01/17/2016  . Abnormality of gait 08/21/2015  . Fracture of left tibia 08/21/2015  . HLD (hyperlipidemia)   . Chronic diastolic congestive heart failure (HCC)   . Depression   . Post-operative pain   . Essential  hypertension   . Paroxysmal SVT (supraventricular tachycardia) (HCC)   . Osteomyelitis of left wrist (HCC)   . History of amputation of lesser toe of right foot (HCC)   . Fracture of shaft of left tibia 08/15/2015  . Fracture, tibia, shaft 08/15/2015  . Acute osteomyelitis of right radius (HCC)   . Osteomyelitis (HCC) 07/05/2015  . Paroxysmal atrial fibrillation (HCC)   . 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 Wound of right ankle 06/05/2015  . Status post amputation of great toe (HCC)   . Pain   . Swelling   . Warmth of joint   . Mycotic aneurysm   . Thigh abscess   . Hand abscess   . Septic embolism (HCC)   . Gangrene of toe (HCC)   . Acute osteomyelitis of left ulna (HCC)   . Streptococcal arthritis of left wrist (HCC)   . Dry gangrene (HCC)   . Wound, open, foot with complication   . Acute deep vein thrombosis (DVT) of distal end of right lower extremity (HCC)   . Left wrist fracture   . Abscess of bursa, left wrist   . SVT (supraventricular tachycardia) (HCC) 05/08/2015  . 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 Adjustment disorder with depressed  mood   . 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 Debility 05/01/2015  . Sepsis (HCC) 04/30/2015  . Acute renal failure with tubular necrosis (HCC)   . Abscess of left hand   . Staphylococcus aureus bacteremia with sepsis (HCC)   . Colles' fracture of left radius   . Cellulitis of left upper extremity   . Diastolic dysfunction   . Nondisplaced fracture of distal end of left radius   . Abscess   . Bacteremia due to Staphylococcus   . Pulmonary hypertension (HCC)   . Acute blood loss anemia   . Aneurysm of right femoral artery (HCC)   . DVT, lower extremity (HCC)   . Radius distal fracture   . Cellulitis of finger of left hand   . Acute pyelonephritis   . Arm pain, anterior   . Cellulitis   . Bacteremia due to Staphylococcus aureus   . Acute renal  failure syndrome (HCC)   . Fracture of left radius   . ARF (acute renal failure) (HCC) 04/07/2015  . Severe sepsis (HCC) 04/07/2015  . Cellulitis of left hand 04/07/2015  . Elevated INR 04/07/2015  . Foot swelling 04/07/2015  . Elevated bilirubin 04/07/2015  . Hypokalemia 04/07/2015  . Hypomagnesemia 04/07/2015  . 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 Bacteremia 04/07/2015  . UTI (lower urinary tract infection)   . Chest pain 11/15/2013  . History of duodenal ulcer 11/15/2013  . 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 Septic shock due to abscess 07/13/2013  . Chronic use of opiate drugs therapeutic purposes 07/12/2013  . Acute renal failure (HCC) 07/11/2013  . Chronic pain syndrome 07/11/2013  . Testosterone deficiency 07/11/2013    Past Surgical History:  Procedure Laterality Date  . AMPUTATION Right 05/17/2015   Procedure: Right 1st and 2nd Toe Amputation;  Surgeon: Nadara Mustard, MD;  Location: [facility] OR;  Service: Orthopedics;  Laterality: Right;  . APPENDECTOMY  ~ 1980  . CARDIAC CATHETERIZATION  1993   "Dr. [place]; for vasospasms"  . CHOLECYSTECTOMY  ~ 1982  . ELBOW ARTHROSCOPY WITH TENDON RECONSTRUCTION Right 1990  . FEMORAL ARTERY EXPLORATION Right 04/11/2015   Procedure: Resection of infected right femoral artery aneurysm;  Surgeon: Fransisco Hertz, MD;  Location: [facility] OR;  Service: Vascular;  Laterality: Right;  . FEMORAL REVISION Right 04/19/2015   Procedure: OVERSEW RIGHT FEMORAL VEIN ;  Surgeon: Chuck Hint, MD;  Location: [facility] OR;  Service: Vascular;  Laterality: Right;  . HEMORRHOIDECTOMY WITH HEMORRHOID BANDING  ~ 1985  . I&D EXTREMITY Left 07/12/2013   Procedure: I&D Left Thigh Abscess;  Surgeon: [name], MD;  Location: [facility] OR;  Service: Orthopedics;  Laterality: Left;  . I&D EXTREMITY Left 04/17/2015   Procedure: IRRIGATION AND DEBRIDEMENT LEFT EXTREMITY;  Surgeon: Fransisco Hertz, MD;  Location: [facility] OR;  Service: Vascular;  Laterality: Left;  . I&D EXTREMITY  Left 07/09/2015   Procedure: IRRIGATION AND DEBRIDEMENT LEFT WRIST;  Surgeon: Knute Neu, MD;  Location: [facility] OR;  Service: Plastics;  Laterality: Left;  . I&D EXTREMITY Left 08/08/2015   Procedure: IRRIGATION AND DEBRIDEMENT EXTREMITY;  Surgeon: Knute Neu, MD;  Location: [facility] OR;  Service: Plastics;  Laterality: Left;  . INCISION AND DRAINAGE ABSCESS Right 04/11/2015   Procedure: INCISION AND DRAINAGE OF RIGHT THIGH ABSCESS;  Surgeon: Fransisco Hertz, MD;  Location: [facility] OR;  Service: Vascular;  Laterality: Right;  . INCISION AND DRAINAGE ABSCESS Right 04/13/2015   Procedure: INCISION AND DRAINAGE THIGH ABSCESS;  Surgeon: Fransisco Hertz, MD;  Location: [facility] OR;  Service: Vascular;  Laterality: Right;  . INGUINAL HERNIA REPAIR Right ~ 1969  . JOINT REPLACEMENT    . KNEE ARTHROSCOPY Bilateral 1985-2008   "left-right"  . MUSCLE FLAP CLOSURE Right 04/19/2015   Procedure: RIGHT RECTUS ABDOMINUS  FLAP TO RIGHT GROIN;  Surgeon: Glenna Fellows, MD;  Location: [facility] OR;  Service: Plastics;  Laterality: Right;  . ORIF TIBIA FRACTURE Left 08/17/2015   Procedure: OPEN REDUCTION INTERNAL FIXATION (ORIF) TIBIA FRACTURE;  Surgeon: Kathryne Hitch, MD;  Location: [facility] OR;  Service: Orthopedics;  Laterality: Left;  . REPLACEMENT TOTAL KNEE BILATERAL Bilateral ~ 2007-2013"   "left-right"  . SHOULDER ARTHROSCOPY W/ ROTATOR CUFF REPAIR Bilateral 1992-1993   "right-left"  . TONSILLECTOMY  ~ 1972  . UVULOPALATOPLASTY  1991  . WOUND EXPLORATION Right 04/23/2015   Procedure: EXAM UNDER ANESTHESIA AND VAC CHANGE RIGHT THIGH;  Surgeon: Glenna Fellows, MD;  Location: [facility] OR;  Service: Plastics;  Laterality: Right;       Home Medications    Prior to Admission medications   Medication Sig Start Date End Date Taking? Authorizing Provider  albuterol (PROAIR HFA) 108 (90 BASE) MCG/ACT inhaler Inhale 1 puff into the lungs every 6 (six) hours as needed for wheezing or shortness of breath.  09/06/13   Historical Provider, MD   apixaban (ELIQUIS) 5 MG TABS tablet Take 1 tablet (5 mg total) by mouth 2 (two) times daily. 02/05/16   Drema Dallas, MD  apixaban (ELIQUIS) 5 MG TABS tablet Take 2 tablets (10 mg total) by mouth 2 (two) times daily. For 7 days. Then switch to 5 mg by mouth twice a day 01/30/16   Drema Dallas, MD  atenolol (TENORMIN) 100 MG tablet Take 100 mg by mouth 2 (two) times daily.    Historical Provider, MD  atorvastatin (LIPITOR) 80 MG tablet Take 1 tablet (80 mg total) by mouth daily. 08/30/15   Mcarthur Rossetti Angiulli, PA-C  dabigatran (PRADAXA) 150 MG CAPS capsule Take 150 mg by mouth 2 (two) times daily.    Historical Provider, MD  docusate sodium (COLACE) 100 MG capsule Take 1 capsule (100 mg total) by mouth 2 (two) times daily. Patient not taking: Reported on 01/17/2016 05/24/15   Mcarthur Rossetti Angiulli, PA-C  famciclovir (FAMVIR) 500 MG tablet Take 500 mg by mouth 2 (two) times daily as needed. ulcers 10/25/13   Historical Provider, MD  flecainide (TAMBOCOR) 100 MG tablet Take 1 tablet (100 mg total) by mouth every 12 (twelve) hours. 08/30/15   Mcarthur Rossetti Angiulli, PA-C  gabapentin (NEURONTIN) 100 MG capsule Take 1 capsule (100 mg total) by mouth 3 (three) times daily. 08/30/15   Mcarthur Rossetti Angiulli, PA-C  morphine (MS CONTIN) 30 MG 12 hr tablet Take 3 tablets (90 mg total) by mouth every 12 (twelve) hours. 08/30/15   Mcarthur Rossetti Angiulli, PA-C  morphine (MS CONTIN) 60 MG 12 hr tablet Take 60 mg by mouth 2 (two) times daily. 01/11/16   Historical Provider, MD  Multiple Vitamin (MULTIVITAMIN WITH MINERALS) TABS tablet Take 1 tablet by mouth daily. 05/24/15   Mcarthur Rossetti Angiulli, PA-C  naloxegol oxalate (MOVANTIK) 25 MG TABS tablet Take 1 tablet (25 mg total) by mouth daily before breakfast. Patient taking differently: Take 25 mg by mouth daily as needed (open induced constipation).  05/24/15   Mcarthur Rossetti Angiulli, PA-C  ondansetron (ZOFRAN) 8 MG tablet Take 8 mg by mouth every 8 (eight) hours as needed.  nausea 12/18/15   Historical  Provider, MD  oxyCODONE (ROXICODONE) 30 MG immediate release tablet Take 1 tablet (30 mg total) by mouth every 4 (four) hours as needed for moderate pain. Patient taking differently: Take 30 mg by mouth every 6 (six) hours.  08/30/15   Mcarthur Rossettianiel J Angiulli, PA-C  polyethylene glycol (MIRALAX / GLYCOLAX) packet Take 17 g by mouth daily. Patient taking differently: Take 17 g by mouth daily as needed for mild constipation.  08/09/15   Shanker Levora DredgeM Ghimire, MD  SUMAtriptan (IMITREX) 50 MG tablet Take 50 mg by mouth daily as needed for migraine or headache. Maximum 2 doses in 2 days 02/15/15   Historical Provider, MD  temazepam (RESTORIL) 30 MG capsule Take 30 mg by mouth at bedtime. 01/24/16   Historical Provider, MD  testosterone cypionate (DEPOTESTOSTERONE CYPIONATE) 200 MG/ML injection Inject 100 mg into the muscle every 14 (fourteen) days. Reported on 08/05/2015 06/25/15   Historical Provider, MD  trifluridine (VIROPTIC) 1 % ophthalmic solution Place 1 drop into both eyes 2 (two) times daily as needed (breakouts (blurry, matted eyelids)).    Historical Provider, MD  venlafaxine XR (EFFEXOR-XR) 75 MG 24 hr capsule Take 1 capsule (75 mg total) by mouth daily with breakfast. Patient taking differently: Take 75 mg by mouth 2 (two) times daily.  08/30/15   Mcarthur Rossettianiel J Angiulli, PA-C    Family History Family History  Problem Relation Age of Onset  . Heart attack Paternal Grandmother [phone]  . Heart disease Mother [phone]    arrythmia  . Stroke Paternal Grandfather   . Stroke Maternal Grandfather     Social History Social History  Substance Use Topics  . Smoking status: Former Smoker    Packs/day: 1.00    Years: 3.00    Types: Cigarettes    Quit date: 01/17/1971  . Smokeless tobacco: Never Used     Comment: "quit smoking cigarettes in 1979  . Alcohol use No     Allergies   Verapamil; Imitrex [sumatriptan]; and Duloxetine hcl   Review of Systems Review of Systems  Constitutional: Negative for fever.    Respiratory: Negative for shortness of breath.   Cardiovascular: Negative for chest pain.  Gastrointestinal: Negative for abdominal pain, nausea and vomiting.  Musculoskeletal: Positive for arthralgias and gait problem.  Skin: Negative for rash.  Neurological: Positive for numbness (from neuropathy, unchanged).     Physical Exam Updated Vital Signs BP 131/100 (BP Location: Right Arm)   Pulse 89   Temp 98 F (36.7 C) (Oral)   Resp 17   Ht 6\' 3"  (1.905 m)   Wt 260 lb (117.9 kg)   SpO2 100%   BMI 32.50 kg/m   Physical Exam  Constitutional: He is oriented to person, place, and time. He appears well-developed and well-nourished. No distress.  HENT:  Head: Normocephalic and atraumatic.  Eyes: Conjunctivae and EOM are normal.  Neck: Normal range of motion.  Cardiovascular: Normal rate, regular rhythm, normal heart sounds and intact distal pulses.  Exam reveals no gallop and no friction rub.   No murmur heard. Pulmonary/Chest: Effort normal and breath sounds normal. No respiratory distress. He has no wheezes. He has no rales.  Abdominal: Soft. He exhibits no distension. There is no tenderness. There is no guarding.  Musculoskeletal: He exhibits no edema.  bilat knees with scars from prior surgery, no erythema, no large effusion, normal ROM with pain Equal 2+DP pulses  Neurological: He is alert and oriented to person, place, and time.  Skin: Skin is warm and dry. He is not diaphoretic.  Nursing note and vitals reviewed.    ED Treatments / Results  Labs (all labs ordered are listed, but only abnormal results are displayed) Labs Reviewed - No data to display  EKG  EKG Interpretation None       Radiology No results found.  Procedures Procedures (including critical care time)  Medications Ordered in ED Medications  morphine (MS CONTIN) 12 hr tablet 60 mg (not administered)     Initial Impression / Assessment and Plan / ED Course  I have reviewed the triage vital  signs and the nursing notes.  Pertinent labs & imaging results that were available during my care of the patient were reviewed by me and considered in my medical decision making (see chart for details).  Clinical Course    60yo male with history of chronic pain presents with concern for chronic pain and desire for pain medication refill. No sign of septic arthritis, no history of trauma, normal pulses, and have low suspicion for acute pathology. Discussed that we are unable to refill narcotic prescriptions from the emergency department. Recommended patient takes Tylenol until he is able to follow up with his pain management doctor tomorrow. We'll provide single dose of patient's home pain medication. Patient received a 1 month supply of his medications on August 15, and he is unclear about why he has run out early. Discussed narcotic addiction with patient.  He will follow up with pain doctor tomorrow.   Final Clinical Impressions(s) / ED Diagnoses   Final diagnoses:  Chronic pain    New Prescriptions New Prescriptions   No medications on file     Alvira Monday, MD 04/05/16 [phone]

## 2016-04-05 NOTE — ED Notes (Signed)
Bed: [facility] Expected date:  Expected time:  Means of arrival:  Comments: 

## 2016-04-05 NOTE — ED Notes (Signed)
Tried calling patient's wife so she can come pick up patient.  Unable to get through line--saying out of service.

## 2016-04-05 NOTE — ED Triage Notes (Addendum)
Per EMS patient comes from home for pain in shoulders and knees.  Patient reports to family that easier to die than to live with this pain.  Patient is out of pain med prescriptions and has appointment with pain management tomorrow.  Patient was seen at [facility] yesterday for pain control and states that wasn't given anything.

## 2016-06-05 ENCOUNTER — Other Ambulatory Visit: Payer: Self-pay | Admitting: *Deleted

## 2016-06-05 DIAGNOSIS — I739 Peripheral vascular disease, unspecified: Secondary | ICD-10-CM

## 2016-07-17 ENCOUNTER — Encounter: Payer: Self-pay | Admitting: Family

## 2016-07-24 ENCOUNTER — Encounter (HOSPITAL_COMMUNITY): Payer: Medicaid Other

## 2016-07-24 ENCOUNTER — Ambulatory Visit: Payer: Medicaid Other | Admitting: Vascular Surgery

## 2016-07-31 ENCOUNTER — Encounter: Payer: Self-pay | Admitting: Family

## 2016-07-31 ENCOUNTER — Ambulatory Visit (INDEPENDENT_AMBULATORY_CARE_PROVIDER_SITE_OTHER): Payer: Medicaid Other | Admitting: Family

## 2016-07-31 ENCOUNTER — Ambulatory Visit: Payer: Medicaid Other | Admitting: Vascular Surgery

## 2016-07-31 ENCOUNTER — Ambulatory Visit (HOSPITAL_COMMUNITY)
Admission: RE | Admit: 2016-07-31 | Discharge: 2016-07-31 | Disposition: A | Discharge status: Home / Self Care | Payer: Medicaid Other | Source: Ambulatory Visit | Attending: Family | Admitting: Family

## 2016-07-31 VITALS — BP 128/86 | HR 102 | Temp 98.0°F | Resp 20 | Ht 76.0 in | Wt 290.0 lb

## 2016-07-31 DIAGNOSIS — E785 Hyperlipidemia, unspecified: Secondary | ICD-10-CM | POA: Diagnosis not present

## 2016-07-31 DIAGNOSIS — I10 Essential (primary) hypertension: Secondary | ICD-10-CM | POA: Diagnosis not present

## 2016-07-31 DIAGNOSIS — I872 Venous insufficiency (chronic) (peripheral): Secondary | ICD-10-CM | POA: Diagnosis not present

## 2016-07-31 DIAGNOSIS — R938 Abnormal findings on diagnostic imaging of other specified body structures: Secondary | ICD-10-CM | POA: Diagnosis not present

## 2016-07-31 DIAGNOSIS — I724 Aneurysm of artery of lower extremity: Secondary | ICD-10-CM

## 2016-07-31 DIAGNOSIS — I739 Peripheral vascular disease, unspecified: Secondary | ICD-10-CM | POA: Diagnosis not present

## 2016-07-31 DIAGNOSIS — I729 Aneurysm of unspecified site: Secondary | ICD-10-CM

## 2016-07-31 DIAGNOSIS — R0989 Other specified symptoms and signs involving the circulatory and respiratory systems: Secondary | ICD-10-CM | POA: Diagnosis present

## 2016-07-31 NOTE — Patient Instructions (Signed)
Venous Stasis or Chronic Venous Insufficiency Chronic venous insufficiency, also called venous stasis, is a condition that affects the veins in the legs. The condition prevents blood from being pumped through these veins effectively. Blood may no longer be pumped effectively from the legs back to the heart. This condition can range from mild to severe. With proper treatment, you should be able to continue with an active life. CAUSES  Chronic venous insufficiency occurs when the vein walls become stretched, weakened, or damaged or when valves within the vein are damaged. Some common causes of this include:  High blood pressure inside the veins (venous hypertension).  Increased blood pressure in the leg veins from long periods of sitting or standing.  A blood clot that blocks blood flow in a vein (deep vein thrombosis).  Inflammation of a superficial vein (phlebitis) that causes a blood clot to form. RISK FACTORS Various things can make you more likely to develop chronic venous insufficiency, including:  Family history of this condition.  Obesity.  Pregnancy.  Sedentary lifestyle.  Smoking.  Jobs requiring long periods of standing or sitting in one place.  Being a certain age. Women in their 40s and 50s and men in their 70s are more likely to develop this condition. SIGNS AND SYMPTOMS  Symptoms may include:   Varicose veins.  Skin breakdown or ulcers.  Reddened or discolored skin on the leg.  Brown, smooth, tight, and painful skin just above the ankle, usually on the inside surface (lipodermatosclerosis).  Swelling. DIAGNOSIS  To diagnose this condition, your health care provider will take a medical history and do a physical exam. The following tests may be ordered to confirm the diagnosis:  Duplex ultrasound-A procedure that produces a picture of a blood vessel and nearby organs and also provides information on blood flow through the blood vessel.  Plethysmography-A  procedure that tests blood flow.  A venogram, or venography-A procedure used to look at the veins using X-ray and dye. TREATMENT The goals of treatment are to help you return to an active life and to minimize pain or disability. Treatment will depend on the severity of the condition. Medical procedures may be needed for severe cases. Treatment options may include:   Use of compression stockings. These can help with symptoms and lower the chances of the problem getting worse, but they do not cure the problem.  Sclerotherapy-A procedure involving an injection of a material that "dissolves" the damaged veins. Other veins in the network of blood vessels take over the function of the damaged veins.  Surgery to remove the vein or cut off blood flow through the vein (vein stripping or laser ablation surgery).  Surgery to repair a valve. HOME CARE INSTRUCTIONS   Wear compression stockings as directed by your health care provider.  Only take over-the-counter or prescription medicines for pain, discomfort, or fever as directed by your health care provider.  Follow up with your health care provider as directed. SEEK MEDICAL CARE IF:   You have redness, swelling, or increasing pain in the affected area.  You see a red streak or line that extends up or down from the affected area.  You have a breakdown or loss of skin in the affected area, even if the breakdown is small.  You have an injury to the affected area. SEEK IMMEDIATE MEDICAL CARE IF:   You have an injury and open wound in the affected area.  Your pain is severe and does not improve with medicine.  You have   sudden numbness or weakness in the foot or ankle below the affected area, or you have trouble moving your foot or ankle.  You have a fever or persistent symptoms for more than 2-3 days.  You have a fever and your symptoms suddenly get worse. MAKE SURE YOU:   Understand these instructions.  Will watch your condition.  Will  get help right away if you are not doing well or get worse. This information is not intended to replace advice given to you by your health care provider. Make sure you discuss any questions you have with your health care provider. Document Released: 11/16/2006 Document Revised: 05/03/2013 Document Reviewed: 03/20/2013 Elsevier Interactive Patient Education  2017 [employer]  

## 2016-07-31 NOTE — Progress Notes (Signed)
[facility] [place]   CC: Follow up peripheral artery occlusive disease, hx of right femoral artery aneurysm  History of Present Illness [name] is a 61 y.o. male pat of Dr. Imogene Burn who is s/p:  1.  R thigh abscess I&D, excision of infected R SFA aneurysm (04/11/15) 2.  Repeat I&D R thigh abscess, VAC (04/13/15) 3.  [place] of R thigh abscess cavity, VAC (04/15/15) 4.  [place] of R thigh abscess cavity, VAC (04/17/15) 5.  Ligation of ruptured R femoral vein (04/19/15) by Dr. Edilia Bo 6.  Rectus abdominus muscle flap to right groin (04/19/15) by Dr. Leta Baptist 7.  VAC, seroma drainage (04/23/15) by DR. Thimmappa 8.  R 1st and 2nd ray amp (05/17/15)  This patient was lost to follow up reported due to financial reasons, though his care was still in the global period.  The patient has no rest pain and all wounds have healed including his R 1st and 2nd ray amp.  The patient notes tachycardia up to 200 beats/minute leading ED visits and swelling legs.  The patient's treatment regimen currently included: maximal medical management and compression stockings.  He sees Dr. Rozelle Logan for chronic pain management of severe OA pain in knees and left shoulder.   Pt Diabetic: states he has become prediabetic due to weight gain from not walking much, walking elicits pain in knees Pt smoker: former smoker, quit in 1979  Pt meds include: Statin :Yes Betablocker: Yes ASA: No Other anticoagulants/antiplatelets: Eliquis  for atrial fib  Past Medical History:  Diagnosis Date  . Anxiety   . Atrial fibrillation (HCC) dx'd 04/2015  . Chronic bronchitis (HCC)    "used to get it q yr; nothing in the last ~ 5 yrs" (08/15/2015)  . Chronic knee pain    "both sides"  . Chronic left shoulder pain   . Depression   . 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 GERD (gastroesophageal reflux disease)   . Hyperlipemia   . Hypertension   . Migraine    "q 3 months" (08/15/2015)  . Osteoarthritis of  both knees   . Peripheral vascular disease (HCC)   . PUD (peptic ulcer disease)   . 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 Smoking status: Former Smoker    Packs/day: 1.00    Years: 3.00    Types: Cigarettes    Quit date: 01/17/1971  . Smokeless tobacco: Never Used     Comment: "quit smoking cigarettes in 1979  . Alcohol use No    Family History Family History  Problem Relation Age of Onset  . Heart attack Paternal Grandmother 69  . Heart disease Mother 46    arrythmia  . Stroke Paternal Grandfather   . Stroke Maternal Grandfather     Past Surgical History:  Procedure Laterality Date  . AMPUTATION Right 05/17/2015   Procedure: Right 1st and 2nd Toe Amputation;  Surgeon: Nadara Mustard, MD;  Location: [facility] OR;  Service: Orthopedics;  Laterality: Right;  . APPENDECTOMY  ~ 1980  . CARDIAC CATHETERIZATION  1993   "Dr. [place]; for vasospasms"  . CHOLECYSTECTOMY  ~ 1982  . ELBOW ARTHROSCOPY WITH TENDON RECONSTRUCTION Right 1990  . FEMORAL ARTERY EXPLORATION Right 04/11/2015   Procedure: Resection of infected right femoral artery aneurysm;  Surgeon: Fransisco Hertz, MD;  Location: [facility] OR;  Service: Vascular;  Laterality: Right;  . FEMORAL REVISION Right 04/19/2015   Procedure: OVERSEW  RIGHT FEMORAL VEIN ;  Surgeon: Chuck Hint, MD;  Location: [facility] OR;  Service: Vascular;  Laterality: Right;  . HEMORRHOIDECTOMY WITH HEMORRHOID BANDING  ~ 1985  . I&D EXTREMITY Left 07/12/2013   Procedure: I&D Left Thigh Abscess;  Surgeon: [name], MD;  Location: [facility] OR;  Service: Orthopedics;  Laterality: Left;  . I&D EXTREMITY Left 04/17/2015   Procedure: IRRIGATION AND DEBRIDEMENT LEFT EXTREMITY;  Surgeon: Fransisco Hertz, MD;  Location: [facility] OR;  Service: Vascular;  Laterality: Left;  . I&D EXTREMITY Left 07/09/2015   Procedure: IRRIGATION AND DEBRIDEMENT LEFT WRIST;  Surgeon: Knute Neu, MD;  Location: [facility] OR;  Service: Plastics;   Laterality: Left;  . I&D EXTREMITY Left 08/08/2015   Procedure: IRRIGATION AND DEBRIDEMENT EXTREMITY;  Surgeon: Knute Neu, MD;  Location: [facility] OR;  Service: Plastics;  Laterality: Left;  . INCISION AND DRAINAGE ABSCESS Right 04/11/2015   Procedure: INCISION AND DRAINAGE OF RIGHT THIGH ABSCESS;  Surgeon: Fransisco Hertz, MD;  Location: [facility] OR;  Service: Vascular;  Laterality: Right;  . INCISION AND DRAINAGE ABSCESS Right 04/13/2015   Procedure: INCISION AND DRAINAGE THIGH ABSCESS;  Surgeon: Fransisco Hertz, MD;  Location: [facility] OR;  Service: Vascular;  Laterality: Right;  . INGUINAL HERNIA REPAIR Right ~ 1969  . JOINT REPLACEMENT    . KNEE ARTHROSCOPY Bilateral 1985-2008   "left-right"  . MUSCLE FLAP CLOSURE Right 04/19/2015   Procedure: RIGHT RECTUS ABDOMINUS  FLAP TO RIGHT GROIN;  Surgeon: Glenna Fellows, MD;  Location: [facility] OR;  Service: Plastics;  Laterality: Right;  . ORIF TIBIA FRACTURE Left 08/17/2015   Procedure: OPEN REDUCTION INTERNAL FIXATION (ORIF) TIBIA FRACTURE;  Surgeon: Kathryne Hitch, MD;  Location: [facility] OR;  Service: Orthopedics;  Laterality: Left;  . REPLACEMENT TOTAL KNEE BILATERAL Bilateral ~ 2007-2013"   "left-right"  . SHOULDER ARTHROSCOPY W/ ROTATOR CUFF REPAIR Bilateral 1992-1993   "right-left"  . TONSILLECTOMY  ~ 1972  . UVULOPALATOPLASTY  1991  . WOUND EXPLORATION Right 04/23/2015   Procedure: EXAM UNDER ANESTHESIA AND VAC CHANGE RIGHT THIGH;  Surgeon: Glenna Fellows, MD;  Location: [facility] OR;  Service: Plastics;  Laterality: Right;    Allergies  Allergen Reactions  . Verapamil Other (See Comments)    Causes pvc's  . Imitrex [Sumatriptan] Other (See Comments)    Chest pain, 2004 (tolerates 50 mg prn)  . Duloxetine Hcl     Psychosis    Current Outpatient Prescriptions  Medication Sig Dispense Refill  . albuterol (PROAIR HFA) 108 (90 BASE) MCG/ACT inhaler Inhale 1 puff into the lungs every 6 (six) hours as needed for wheezing or shortness of breath.     Marland Kitchen apixaban  (ELIQUIS) 5 MG TABS tablet Take 2 tablets (10 mg total) by mouth 2 (two) times daily. For 7 days. Then switch to 5 mg by mouth twice a day 14 tablet 0  . atenolol (TENORMIN) 100 MG tablet Take 100 mg by mouth 2 (two) times daily.    Marland Kitchen atorvastatin (LIPITOR) 80 MG tablet Take 1 tablet (80 mg total) by mouth daily. 30 tablet 1  . docusate sodium (COLACE) 100 MG capsule Take 1 capsule (100 mg total) by mouth 2 (two) times daily. 10 capsule 0  . famciclovir (FAMVIR) 500 MG tablet Take 500 mg by mouth 2 (two) times daily as needed. ulcers    . flecainide (TAMBOCOR) 100 MG tablet Take 1 tablet (100 mg total) by mouth every 12 (twelve) hours. 60 tablet 1  . gabapentin (NEURONTIN) 100 MG  capsule Take 1 capsule (100 mg total) by mouth 3 (three) times daily. 90 capsule 1  . morphine (MS CONTIN) 30 MG 12 hr tablet Take 3 tablets (90 mg total) by mouth every 12 (twelve) hours. 90 tablet 0  . morphine (MS CONTIN) 60 MG 12 hr tablet Take 60 mg by mouth 2 (two) times daily.  0  . Multiple Vitamin (MULTIVITAMIN WITH MINERALS) TABS tablet Take 1 tablet by mouth daily.    . naloxegol oxalate (MOVANTIK) 25 MG TABS tablet Take 1 tablet (25 mg total) by mouth daily before breakfast. (Patient taking differently: Take 25 mg by mouth daily as needed (open induced constipation). ) 30 tablet 0  . ondansetron (ZOFRAN) 8 MG tablet Take 8 mg by mouth every 8 (eight) hours as needed. nausea    . oxyCODONE (ROXICODONE) 30 MG immediate release tablet Take 1 tablet (30 mg total) by mouth every 4 (four) hours as needed for moderate pain. (Patient taking differently: Take 30 mg by mouth every 6 (six) hours. ) 90 tablet 0  . polyethylene glycol (MIRALAX / GLYCOLAX) packet Take 17 g by mouth daily. (Patient taking differently: Take 17 g by mouth daily as needed for mild constipation. ) 100 each 0  . SUMAtriptan (IMITREX) 50 MG tablet Take 50 mg by mouth daily as needed for migraine or headache. Maximum 2 doses in 2 days  0  . temazepam  (RESTORIL) 30 MG capsule Take 30 mg by mouth at bedtime.  0  . testosterone cypionate (DEPOTESTOSTERONE CYPIONATE) 200 MG/ML injection Inject 100 mg into the muscle every 14 (fourteen) days. Reported on 08/05/2015  0  . trifluridine (VIROPTIC) 1 % ophthalmic solution Place 1 drop into both eyes 2 (two) times daily as needed (breakouts (blurry, matted eyelids)).    Marland Kitchen. venlafaxine XR (EFFEXOR-XR) 75 MG 24 hr capsule Take 1 capsule (75 mg total) by mouth daily with breakfast. (Patient taking differently: Take 75 mg by mouth 2 (two) times daily. ) 30 capsule 1   No current facility-administered medications for this visit.     ROS: See HPI for pertinent positives and negatives.   Physical Examination  Vitals:   07/31/16 1123 07/31/16 1133  BP: (!) 150/92 128/86  Pulse: (!) 104 (!) 102  Resp: 20   Temp: 98 F (36.7 C)   TempSrc: Oral   SpO2: 94%   Weight: 290 lb (131.5 kg)   Height: 6\' 4"  (1.93 m)    Body mass index is 35.3 kg/m.  General: A&O x 3, WDWN, obese male  Eyes: PERRLA  Pulmonary: Sym exp, respirations are non labored, good air movt, CTAB, no rales, rhonchi, & wheezing  Cardiac: RRR, Nl S1, S2, no detected murmur, rubs or gallops. HR 96  Vascular: Vessel Right Left  Radial Palpable Palpable  Brachial Palpable Palpable  Carotid Palpable, without bruit Palpable, without bruit  Aorta Not palpable N/A  Femoral Not  Palpable Palpable  Popliteal Not palpable Not palpable  PT Not Palpable  Palpable  DP Faintly Palpable Faintly Palpable   Gastrointestinal: soft, NTND, no G/R, no HSM,  no CVAT B, moderate perimedian hernia, healed incision  Musculoskeletal: M/S 5/5 throughout , Extremities without ischemic changes, varicosities evident, well healed thigh incision, well healed R ray amp  Neurologic: Pain and light touch intact in extremities , Motor exam as listed above.    Non-Invasive Vascular Imaging: DATE: 07/31/2016 ABI:  RIGHT: 0.93 (0.89, 01-16-16),  Waveforms: triphasic PT, biphasic DP LEFT: 1.30 (1.1), Waveforms: triphasic, TBI:  0.46  ASSESSMENT: [name] is a 61 y.o. 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 Ledora Bottcher, R 1st & 2nd ray amp.  Daily seated leg exercises demonstrated and discussed. Regular participation in water exercises.   Dr. Nicky Pugh assessment from 01-17-16:  Despite losing his R SFA due to the mycotic aneurysm, the patient has collateralized extensively from the PFA, reconstituting essential bi-triphasic flow in the distal SFA/pop.  The venous flow is also collateralizing as his swelling in his right leg is greatly improved.  In this patient, his GSV should NEVER BE HARVESTED OR ABLATED, as he might be at risk for venous gangrene if this occurs as his femoral vein was also digested by the mycotic aneurysm.  Based on the patient's vascular studies and examination, I have offered the patient: q6 month ABI.  I advised pt to notify us should he develop concerns re the circulation in his legs  I discussed in depth with the patient the nature of atherosclerosis, and emphasized the importance of maximal medical management including strict control of blood pressure, blood glucose, and lipid levels, antiplatelet agents, obtaining regular exercise, and cessation of smoking.    The patient is aware that without maximal medical management the underlying atherosclerotic disease process will progress, limiting the benefit of any interventions.  The patient is currently on a statin: Lipitor. The patient is currently not on an anti-platelet: as on Eliquis.  The patient was given information about PAD including signs, symptoms, treatment, what symptoms should prompt the patient to seek immediate medical care, and risk reduction measures to take.  Charisse March, RN, MSN, FNP-C [facility]  [place] Phone: [phone]  Clinic MD: Imogene Burn  07/31/16 11:38 AM

## 2016-08-03 ENCOUNTER — Other Ambulatory Visit: Payer: Self-pay

## 2016-08-03 DIAGNOSIS — I739 Peripheral vascular disease, unspecified: Secondary | ICD-10-CM

## 2017-01-25 ENCOUNTER — Encounter: Payer: Self-pay | Admitting: Family

## 2017-02-05 ENCOUNTER — Encounter (HOSPITAL_COMMUNITY): Payer: Medicaid Other

## 2017-02-05 ENCOUNTER — Ambulatory Visit: Payer: Self-pay | Admitting: Family

## 2017-02-12 ENCOUNTER — Encounter (HOSPITAL_COMMUNITY): Payer: Medicaid Other

## 2017-02-12 ENCOUNTER — Ambulatory Visit: Payer: Self-pay | Admitting: Family

## 2017-02-26 ENCOUNTER — Encounter (HOSPITAL_BASED_OUTPATIENT_CLINIC_OR_DEPARTMENT_OTHER): Payer: Self-pay

## 2017-02-26 ENCOUNTER — Emergency Department (HOSPITAL_BASED_OUTPATIENT_CLINIC_OR_DEPARTMENT_OTHER)
Admission: EM | Admit: 2017-02-26 | Discharge: 2017-02-27 | Disposition: A | Discharge status: Home / Self Care | Payer: Medicare Other | Attending: Emergency Medicine | Admitting: Emergency Medicine

## 2017-02-26 DIAGNOSIS — K047 Periapical abscess without sinus: Secondary | ICD-10-CM | POA: Diagnosis not present

## 2017-02-26 DIAGNOSIS — I110 Hypertensive heart disease with heart failure: Secondary | ICD-10-CM | POA: Insufficient documentation

## 2017-02-26 DIAGNOSIS — K0889 Other specified disorders of teeth and supporting structures: Secondary | ICD-10-CM

## 2017-02-26 DIAGNOSIS — Z87891 Personal history of nicotine dependence: Secondary | ICD-10-CM | POA: Diagnosis not present

## 2017-02-26 DIAGNOSIS — Z79899 Other long term (current) drug therapy: Secondary | ICD-10-CM | POA: Insufficient documentation

## 2017-02-26 DIAGNOSIS — I5032 Chronic diastolic (congestive) heart failure: Secondary | ICD-10-CM | POA: Insufficient documentation

## 2017-02-26 HISTORY — DX: Adjustment disorder with depressed mood: F43.21

## 2017-02-26 HISTORY — DX: Other chronic pain: G89.29

## 2017-02-26 MED ORDER — CLINDAMYCIN HCL 150 MG PO CAPS: 450.0000 mg | ORAL_CAPSULE | Freq: Three times a day (TID) | ORAL | 0 refills | Status: AC

## 2017-02-26 MED ORDER — BUPIVACAINE-EPINEPHRINE 0.5% -1:200000 IJ SOLN
4.0000 mg | Freq: Once | INTRAMUSCULAR | Status: DC
Start: 2017-02-27 — End: 2017-02-26
  Filled 2017-02-26: qty 0.8

## 2017-02-26 MED ORDER — BUPIVACAINE-EPINEPHRINE 0.5% -1:200000 IJ SOLN
0.8000 mL | Freq: Once | INTRAMUSCULAR | Status: AC
  Administered 2017-02-26: 4 mg
  Filled 2017-02-26: qty 0.8

## 2017-02-26 MED ORDER — BENZOCAINE 20 % MT AERO
INHALATION_SPRAY | OROMUCOSAL | Status: AC
  Filled 2017-02-26: qty 57

## 2017-02-26 MED ORDER — BENZOCAINE 20 % MT AERO
INHALATION_SPRAY | Freq: Once | OROMUCOSAL | Status: AC
  Administered 2017-02-26: via OROMUCOSAL

## 2017-02-26 MED ORDER — IBUPROFEN 600 MG PO TABS: 600.0000 mg | ORAL_TABLET | Freq: Three times a day (TID) | ORAL | 0 refills | Status: DC | PRN

## 2017-02-26 MED ORDER — BUPIVACAINE-EPINEPHRINE (PF) 0.5% -1:200000 IJ SOLN
INTRAMUSCULAR | Status: AC
  Filled 2017-02-26: qty 1.8

## 2017-02-26 MED ORDER — CLINDAMYCIN HCL 150 MG PO CAPS
450.0000 mg | ORAL_CAPSULE | Freq: Once | ORAL | Status: AC
  Administered 2017-02-27: 450 mg via ORAL
  Filled 2017-02-26: qty 3

## 2017-02-26 NOTE — ED Notes (Signed)
ED Provider at bedside. 

## 2017-02-26 NOTE — ED Provider Notes (Signed)
[facility] Provider Note   CSN: [id number] Arrival date & time: 02/26/17  2032  By signing my name below, I, Rosario Adie, attest that this documentation has been prepared under the direction and in the presence of Shaune Pollack, MD. Electronically Signed: Rosario Adie, ED Scribe. 02/26/17. 10:36 PM.  History   Chief Complaint Chief Complaint  Patient presents with  . Dental Pain   The history is provided by the patient. No language interpreter was used.    HPI Comments: [name] is a 61 y.o. male who presents to the Emergency Department complaining of persistent, gradually worsening, right-sided, upper dental pain beginning several days ago. Pt describes their pain as throbbing. Pt reports that he lost a filling to the tooth ~80mo ago; over the past several days he has had worsening pain to the right upper tooth where his filling was previously located. He also notes associated fatigue, headache, diarrhea, and right-sided facial swelling. No noted treatments for his pain were tried prior to coming into the ED. Pt's pain is exacerbated with chewing. They are currently followed by a dental specialist. No recent antibiotic usage. Pt denies fever, chills, vomiting, or any other associated symptoms.   Past Medical History:  Diagnosis Date  . Adjustment disorder with depressed mood   . Anxiety   . Atrial fibrillation (HCC) dx'd 04/2015  . Chronic bronchitis (HCC)    "used to get it q yr; nothing in the last ~ 5 yrs" (08/15/2015)  . Chronic knee pain    "both sides"  . Chronic left shoulder pain   . Chronic pain   . Depression   . 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 GERD (gastroesophageal reflux disease)   . Hyperlipemia   . Hypertension   . Migraine    "q 3 months" (08/15/2015)  . Osteoarthritis of both knees   . Peripheral vascular disease (HCC)   . PUD (peptic ulcer disease)   . 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 Drug-seeking behavior 04/04/2016  . Acute pulmonary embolism (HCC)   . SOB (shortness of breath)   . PE (pulmonary embolism) 01/27/2016  . Pulmonary embolism (HCC) 01/27/2016  . Dysrhythmia 01/27/2016  . Chronic venous insufficiency 01/17/2016  . Abnormality of gait 08/21/2015  . Fracture of left tibia 08/21/2015  . HLD (hyperlipidemia)   . Chronic diastolic congestive heart failure (HCC)   . Depression   . Post-operative pain   . Essential hypertension   . Paroxysmal SVT (supraventricular tachycardia) (HCC)   . Osteomyelitis of left wrist (HCC)   . History of amputation of lesser toe of right foot (HCC)   . Fracture of shaft of left tibia 08/15/2015  . Fracture, tibia, shaft 08/15/2015  . Acute osteomyelitis of right radius (HCC)   . Osteomyelitis (HCC) 07/05/2015  . Paroxysmal atrial fibrillation (HCC)   . 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 Wound of right ankle 06/05/2015  . Status post amputation of great toe (HCC)   . Pain   . Swelling   . Warmth of joint   . Mycotic aneurysm (HCC)   . Thigh abscess   . Hand abscess   . Septic embolism (HCC)   . Gangrene of toe (HCC)   . Acute osteomyelitis of left ulna (HCC)   . Streptococcal arthritis of left wrist (HCC)   . Dry gangrene (HCC)   .  Wound, open, foot with complication   . Acute deep vein thrombosis (DVT) of distal end of right lower extremity (HCC)   . Left wrist fracture   . Abscess of bursa, left wrist   . SVT (supraventricular tachycardia) (HCC) 05/08/2015  . 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 Adjustment disorder with depressed mood   . 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 Debility 05/01/2015  . Sepsis (HCC) 04/30/2015  . Acute renal failure with tubular necrosis (HCC)   . Abscess of left hand   . Staphylococcus aureus bacteremia with sepsis (HCC)   . Colles' fracture of left radius     . Cellulitis of left upper extremity   . Diastolic dysfunction   . Nondisplaced fracture of distal end of left radius   . Abscess   . Bacteremia due to Staphylococcus   . Pulmonary hypertension (HCC)   . Acute blood loss anemia   . Aneurysm of right femoral artery (HCC)   . DVT, lower extremity (HCC)   . Radius distal fracture   . Cellulitis of finger of left hand   . Acute pyelonephritis   . Arm pain, anterior   . Cellulitis   . Bacteremia due to Staphylococcus aureus   . Acute renal failure syndrome (HCC)   . Fracture of left radius   . ARF (acute renal failure) (HCC) 04/07/2015  . Severe sepsis (HCC) 04/07/2015  . Cellulitis of left hand 04/07/2015  . Elevated INR 04/07/2015  . Foot swelling 04/07/2015  . Elevated bilirubin 04/07/2015  . Hypokalemia 04/07/2015  . Hypomagnesemia 04/07/2015  . 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 Bacteremia 04/07/2015  . UTI (lower urinary tract infection)   . Chest pain 11/15/2013  . History of duodenal ulcer 11/15/2013  . 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 Septic shock due to abscess 07/13/2013  . Chronic use of opiate drugs therapeutic purposes 07/12/2013  . Acute renal failure (HCC) 07/11/2013  . Chronic pain syndrome 07/11/2013  . Testosterone deficiency 07/11/2013   Past Surgical History:  Procedure Laterality Date  . AMPUTATION Right 05/17/2015   Procedure: Right 1st and 2nd Toe Amputation;  Surgeon: Nadara MustardMarcus Duda V, MD;  [facility];  Service: Orthopedics;  Laterality: Right;  . APPENDECTOMY  ~ 1980  . CARDIAC CATHETERIZATION  1993   "Dr. SwazilandJordan; for vasospasms"  . CHOLECYSTECTOMY  ~ 1982  . ELBOW ARTHROSCOPY WITH TENDON RECONSTRUCTION Right 1990  . FEMORAL ARTERY EXPLORATION Right 04/11/2015   Procedure: Resection of infected right femoral artery aneurysm;  Surgeon: Fransisco HertzBrian L Chen, MD;  [facility];  Service: Vascular;  Laterality: Right;  . FEMORAL REVISION Right 04/19/2015   Procedure: OVERSEW RIGHT FEMORAL VEIN  ;  Surgeon: Chuck Hinthristopher S Dickson, MD;  [facility];  Service: Vascular;  Laterality: Right;  . HEMORRHOIDECTOMY WITH HEMORRHOID BANDING  ~ 1985  . I&D EXTREMITY Left 07/12/2013   Procedure: I&D Left Thigh Abscess;  Surgeon: Toni ArthursJohn [name], MD;  [facility];  Service: Orthopedics;  Laterality: Left;  . I&D EXTREMITY Left 04/17/2015   Procedure: IRRIGATION AND DEBRIDEMENT LEFT EXTREMITY;  Surgeon: Fransisco HertzBrian L Chen, MD;  [facility];  Service: Vascular;  Laterality: Left;  . I&D EXTREMITY Left 07/09/2015   Procedure: IRRIGATION AND DEBRIDEMENT LEFT WRIST;  Surgeon: Knute NeuHarrill Coley, MD;  Location: [facility] OR;  Service: Plastics;  Laterality: Left;  . I&D EXTREMITY Left 08/08/2015   Procedure: IRRIGATION AND DEBRIDEMENT EXTREMITY;  Surgeon: Knute NeuHarrill Coley, MD;  Location: [facility] OR;  Service: Government social research officerlastics;  Laterality: Left;  . INCISION AND DRAINAGE ABSCESS Right 04/11/2015   Procedure: INCISION AND DRAINAGE OF RIGHT THIGH ABSCESS;  Surgeon: Fransisco HertzBrian L Chen, MD;  [facility];  Service: Vascular;  Laterality: Right;  . INCISION AND DRAINAGE ABSCESS Right 04/13/2015   Procedure: INCISION AND DRAINAGE THIGH ABSCESS;  Surgeon: Fransisco HertzBrian L Chen, MD;  [facility];  Service: Vascular;  Laterality: Right;  . INGUINAL HERNIA REPAIR Right ~ 1969  . JOINT REPLACEMENT    . KNEE ARTHROSCOPY Bilateral 1985-2008   "left-right"  . MUSCLE FLAP CLOSURE Right 04/19/2015   Procedure: RIGHT RECTUS ABDOMINUS  FLAP TO RIGHT GROIN;  Surgeon: Glenna FellowsBrinda Thimmappa, MD;  Location: [facility] OR;  Service: Plastics;  Laterality: Right;  . ORIF TIBIA FRACTURE Left 08/17/2015   Procedure: OPEN REDUCTION INTERNAL FIXATION (ORIF) TIBIA FRACTURE;  Surgeon: Kathryne Hitchhristopher Y Blackman, MD;  Location: [facility] OR;  Service: Orthopedics;  Laterality: Left;  . REPLACEMENT TOTAL KNEE BILATERAL Bilateral ~ 2007-2013"   "left-right"  . SHOULDER ARTHROSCOPY W/ ROTATOR CUFF REPAIR Bilateral 1992-1993   "right-left"  . TONSILLECTOMY  ~ 1972  . UVULOPALATOPLASTY  1991  .  WOUND EXPLORATION Right 04/23/2015   Procedure: EXAM UNDER ANESTHESIA AND VAC CHANGE RIGHT THIGH;  Surgeon: Glenna FellowsBrinda Thimmappa, MD;  Location: [facility] OR;  Service: Plastics;  Laterality: Right;    Home Medications    Prior to Admission medications   Medication Sig Start Date End Date Taking? Authorizing Provider  albuterol (PROAIR HFA) 108 (90 BASE) MCG/ACT inhaler Inhale 1 puff into the lungs every 6 (six) hours as needed for wheezing or shortness of breath.  09/06/13   [provider]  atenolol (TENORMIN) 100 MG tablet Take 100 mg by mouth 2 (two) times daily.    [provider]  atorvastatin (LIPITOR) 80 MG tablet Take 1 tablet (80 mg total) by mouth daily. 08/30/15   Angiulli, Mcarthur Rossettianiel J, PA-C  clindamycin (CLEOCIN) 150 MG capsule Take 3 capsules (450 mg total) by mouth 3 (three) times daily. 02/26/17 03/08/17  Shaune PollackIsaacs, Othel Dicostanzo, MD  famciclovir (FAMVIR) 500 MG tablet Take 500 mg by mouth 2 (two) times daily as needed. ulcers 10/25/13   [provider]  flecainide (TAMBOCOR) 100 MG tablet Take 1 tablet (100 mg total) by mouth every 12 (twelve) hours. 08/30/15   Angiulli, Mcarthur Rossettianiel J, PA-C  gabapentin (NEURONTIN) 100 MG capsule Take 1 capsule (100 mg total) by mouth 3 (three) times daily. 08/30/15   Angiulli, Mcarthur Rossettianiel J, PA-C  ibuprofen (ADVIL,MOTRIN) 600 MG tablet Take 1 tablet (600 mg total) by mouth every 8 (eight) hours as needed for moderate pain. 02/26/17   Shaune PollackIsaacs, Romeo Zielinski, MD  morphine (MS CONTIN) 60 MG 12 hr tablet Take 60 mg by mouth 2 (two) times daily. 01/11/16   [provider]  Multiple Vitamin (MULTIVITAMIN WITH MINERALS) TABS tablet Take 1 tablet by mouth daily. 05/24/15   Angiulli, Mcarthur Rossettianiel J, PA-C  naloxegol oxalate (MOVANTIK) 25 MG TABS tablet Take 1 tablet (25 mg total) by mouth daily before breakfast. Patient taking differently: Take 25 mg by mouth daily as needed (open induced constipation).  05/24/15   Angiulli, Mcarthur Rossettianiel J, PA-C  ondansetron (ZOFRAN) 8 MG tablet Take 8  mg by mouth every 8 (eight) hours as needed. nausea 12/18/15   [provider]  oxyCODONE (ROXICODONE) 30 MG immediate release tablet Take 1 tablet (30 mg total) by mouth every 4 (four) hours as needed for moderate pain. Patient taking differently: Take 20 mg by mouth  3 (three) times daily.  08/30/15   Angiulli, Mcarthur Rossetti, PA-C  temazepam (RESTORIL) 30 MG capsule Take 30 mg by mouth at bedtime. 01/24/16   [provider]  trifluridine (VIROPTIC) 1 % ophthalmic solution Place 1 drop into both eyes 2 (two) times daily as needed (breakouts (blurry, matted eyelids)).    [provider]  venlafaxine XR (EFFEXOR-XR) 75 MG 24 hr capsule Take 1 capsule (75 mg total) by mouth daily with breakfast. Patient taking differently: Take 75 mg by mouth 2 (two) times daily.  08/30/15   Angiulli, Mcarthur Rossetti, PA-C   Family History Family History  Problem Relation Age of Onset  . Heart attack Paternal Grandmother 24  . Heart disease Mother 59       arrythmia  . Stroke Paternal Grandfather   . Stroke Maternal Grandfather    Social History Social History  Substance Use Topics  . Smoking status: Former Smoker    Packs/day: 1.00    Years: 3.00    Types: Cigarettes    Quit date: 01/17/1971  . Smokeless tobacco: Never Used     Comment: "quit smoking cigarettes in 1979  . Alcohol use No   Allergies   Verapamil; Imitrex [sumatriptan]; and Duloxetine hcl  Review of Systems Review of Systems  Constitutional: Positive for fatigue. Negative for chills and fever.  HENT: Positive for dental problem and facial swelling. Negative for congestion and rhinorrhea.   Eyes: Negative for visual disturbance.  Respiratory: Negative for cough, shortness of breath and wheezing.   Cardiovascular: Negative for chest pain and leg swelling.  Gastrointestinal: Positive for diarrhea. Negative for abdominal pain, nausea and vomiting.  Genitourinary: Negative for dysuria and flank pain.  Musculoskeletal: Negative  for neck pain and neck stiffness.  Skin: Negative for rash and wound.  Allergic/Immunologic: Negative for immunocompromised state.  Neurological: Positive for headaches. Negative for syncope and weakness.  All other systems reviewed and are negative.  Physical Exam Updated Vital Signs BP (!) 156/101 (BP Location: Left Arm)   Pulse (!) 108   Temp 98 F (36.7 C) (Oral)   Resp 20   Ht 5\' 6"  (1.676 m)   Wt (!) 139.7 kg (308 lb)   SpO2 100%   BMI 49.71 kg/m   Physical Exam  Constitutional: He appears well-developed and well-nourished. No distress.  HENT:  Head: Normocephalic and atraumatic.  Markedly poor dentition. Right first maxillary premolar with exposed dentin, signs of decay and active dental caries. There is moderate gingival erythema and swelling with palpable, approx 2 x 1 cm fluctuant abscess along outer aspect of tooth apex. No significant facial swelling, redness, or buccal fluctuance. No drainage. No sublingual swelling or edema. Phonation normal. OP widely patent.  Eyes: Conjunctivae are normal.  Neck: Normal range of motion.  Cardiovascular: Normal rate.   Pulmonary/Chest: Effort normal.  Abdominal: He exhibits no distension.  Musculoskeletal: Normal range of motion.  Neurological: He is alert.  Skin: No pallor.  Psychiatric: He has a normal mood and affect. His behavior is normal.  Nursing note and vitals reviewed.  ED Treatments / Results  DIAGNOSTIC STUDIES: Oxygen Saturation is 100% on RA, normal by my interpretation.   COORDINATION OF CARE: 10:36 PM-Discussed next steps with pt. Pt verbalized understanding and is agreeable with the plan.   Labs (all labs ordered are listed, but only abnormal results are displayed) Labs Reviewed - No data to display  EKG  EKG Interpretation None      Radiology No results found.  Procedures .Marland KitchenIncision and Drainage Date/Time: 02/27/2017 2:28 AM Performed by: Shaune Pollack Authorized by: Shaune Pollack    Consent:    Consent obtained:  Verbal   Consent given by:  Patient   Risks discussed:  Bleeding, incomplete drainage, pain, damage to other organs and infection   Alternatives discussed:  Alternative treatment Location:    Type:  Abscess   Location:  Mouth   Mouth location:  Alveolar process Anesthesia (see MAR for exact dosages):    Anesthesia method:  Nerve block   Block location:  Superior alveolar   Block needle gauge:  27 G   Block anesthetic:  Bupivacaine 0.5% WITH epi   Block injection procedure:  Anatomic landmarks identified, introduced needle, negative aspiration for blood, incremental injection and anatomic landmarks palpated   Block outcome:  Anesthesia achieved Procedure type:    Complexity:  Simple Procedure details:    Incision types:  Single straight   Incision depth:  Submucosal   Scalpel blade:  11   Wound management:  Probed and deloculated   Drainage:  Purulent   Drainage amount:  Copious   Wound treatment:  Wound left open Post-procedure details:    Patient tolerance of procedure:  Tolerated well, no immediate complications     Medications Ordered in ED Medications  bupivacaine-epinephrine (MARCAINE W/ EPI) 0.5% -1:200000 injection (not administered)  bupivacaine-EPINEPHrine (MARCAINE W/ EPI) 0.5% -1:200000 (with pres) injection 4 mg (4 mg Infiltration Given by Other 02/26/17 2357)  Benzocaine (HURRCAINE) 20 % mouth spray ( Mouth/Throat Given by Other 02/26/17 2357)  clindamycin (CLEOCIN) capsule 450 mg (450 mg Oral Given 02/27/17 0000)    Initial Impression / Assessment and Plan / ED Course  I have reviewed the triage vital signs and the nursing notes.  Pertinent labs & imaging results that were available during my care of the patient were reviewed by me and considered in my medical decision making (see chart for details).     61 yo M with PMHx of chronic pain, HTN, HLD, osteomyelitis here with right upper tooth pain. On exam, pt has diffusely poor  dentition with active caries and periapical/gingival abscess of right maxillary first premolar. Pt mildly tachycardic here on arrival but is o/w afebrile, well-appearing, without signs of sepsis. Following informed consent, pt anesthetized via superior alveolar block with excellent analgesia and resolution of his pain and tachycardia. The abscess was cautiously I&D'ed with return of purulent material and resolution of swelling. He is o/w well appearing w/o signs of facial abscess, Ludwig's, or deep oral/pharyngeal/neck infection. Will place on [place] given his complex history, d/c with outpt dentistry follow-up. Return precautions given.  Final Clinical Impressions(s) / ED Diagnoses   Final diagnoses:  Pain, dental  Dental abscess   New Prescriptions Discharge Medication List as of 02/26/2017 11:57 PM    START taking these medications   Details  clindamycin (CLEOCIN) 150 MG capsule Take 3 capsules (450 mg total) by mouth 3 (three) times daily., Starting Fri 02/26/2017, Until Mon 03/08/2017, Print    ibuprofen (ADVIL,MOTRIN) 600 MG tablet Take 1 tablet (600 mg total) by mouth every 8 (eight) hours as needed for moderate pain., Starting Fri 02/26/2017, Print        I personally performed the services described in this documentation, which was scribed in my presence. The recorded information has been reviewed and is accurate.     Shaune Pollack, MD 02/27/17 [phone]

## 2017-02-26 NOTE — ED Triage Notes (Signed)
C/o right upper toothache-filling came out 6 months ago-also c/o feeling fatigue-NAD-steady gait

## 2017-02-26 NOTE — ED Notes (Signed)
Family at bedside. 

## 2017-05-24 NOTE — Telephone Encounter (Signed)
Checking labs

## 2017-09-09 ENCOUNTER — Emergency Department (HOSPITAL_BASED_OUTPATIENT_CLINIC_OR_DEPARTMENT_OTHER): Payer: Medicare HMO

## 2017-09-09 ENCOUNTER — Observation Stay (HOSPITAL_BASED_OUTPATIENT_CLINIC_OR_DEPARTMENT_OTHER)
Admission: EM | Admit: 2017-09-09 | Discharge: 2017-09-10 | Disposition: A | Discharge status: Home / Self Care | Payer: Medicare HMO | Attending: Internal Medicine | Admitting: Internal Medicine

## 2017-09-09 ENCOUNTER — Encounter (HOSPITAL_BASED_OUTPATIENT_CLINIC_OR_DEPARTMENT_OTHER): Payer: Self-pay

## 2017-09-09 ENCOUNTER — Other Ambulatory Visit: Payer: Self-pay

## 2017-09-09 DIAGNOSIS — Z87891 Personal history of nicotine dependence: Secondary | ICD-10-CM | POA: Insufficient documentation

## 2017-09-09 DIAGNOSIS — Z8679 Personal history of other diseases of the circulatory system: Secondary | ICD-10-CM | POA: Diagnosis not present

## 2017-09-09 DIAGNOSIS — I110 Hypertensive heart disease with heart failure: Secondary | ICD-10-CM | POA: Insufficient documentation

## 2017-09-09 DIAGNOSIS — Z79899 Other long term (current) drug therapy: Secondary | ICD-10-CM | POA: Diagnosis not present

## 2017-09-09 DIAGNOSIS — Z96653 Presence of artificial knee joint, bilateral: Secondary | ICD-10-CM | POA: Diagnosis not present

## 2017-09-09 DIAGNOSIS — R079 Chest pain, unspecified: Principal | ICD-10-CM | POA: Diagnosis present

## 2017-09-09 DIAGNOSIS — I5032 Chronic diastolic (congestive) heart failure: Secondary | ICD-10-CM | POA: Diagnosis not present

## 2017-09-09 DIAGNOSIS — Z86711 Personal history of pulmonary embolism: Secondary | ICD-10-CM | POA: Diagnosis not present

## 2017-09-09 HISTORY — DX: Other pulmonary embolism without acute cor pulmonale: I26.99

## 2017-09-09 LAB — BASIC METABOLIC PANEL
ANION GAP: 10 (ref 5–15)
BUN: 15 mg/dL (ref 6–20)
CALCIUM: 8.9 mg/dL (ref 8.9–10.3)
CO2: 26 mmol/L (ref 22–32)
CREATININE: 1.22 mg/dL (ref 0.61–1.24)
Chloride: 98 mmol/L — ABNORMAL LOW (ref 101–111)
GFR calc Af Amer: >60 mL/min (ref >60)
Glucose, Bld: 119 mg/dL — ABNORMAL HIGH (ref 65–99)
Potassium: 4.1 mmol/L (ref 3.5–5.1)
Sodium: 134 mmol/L — ABNORMAL LOW (ref 135–145)

## 2017-09-09 LAB — TROPONIN I

## 2017-09-09 LAB — CBC
HCT: 43.9 % (ref 39.0–52.0)
Hemoglobin: 14.5 g/dL (ref 13.0–17.0)
MCH: 30.2 pg (ref 26.0–34.0)
MCHC: 33 g/dL (ref 30.0–36.0)
MCV: 91.5 fL (ref 78.0–100.0)
PLATELETS: 201 10*3/uL (ref 150–400)
RBC: 4.8 MIL/uL (ref 4.22–5.81)
RDW: 14.1 % (ref 11.5–15.5)
WBC: 8.6 10*3/uL (ref 4.0–10.5)

## 2017-09-09 LAB — D-DIMER, QUANTITATIVE (NOT AT ARMC): D DIMER QUANT: 0.9 ug{FEU}/mL — AB (ref 0.00–0.50)

## 2017-09-09 MED ORDER — NITROGLYCERIN 0.4 MG SL SUBL
SUBLINGUAL_TABLET | SUBLINGUAL | Status: AC
  Administered 2017-09-09: 0.4 mg via SUBLINGUAL
  Filled 2017-09-09: qty 3

## 2017-09-09 MED ORDER — ASPIRIN 81 MG PO CHEW
324.0000 mg | CHEWABLE_TABLET | Freq: Once | ORAL | Status: AC
  Administered 2017-09-09: 324 mg via ORAL
  Filled 2017-09-09: qty 4

## 2017-09-09 MED ORDER — NITROGLYCERIN 0.4 MG SL SUBL
0.4000 mg | SUBLINGUAL_TABLET | SUBLINGUAL | Status: DC | PRN
  Administered 2017-09-09 – 2017-09-10 (×4): 0.4 mg via SUBLINGUAL
  Filled 2017-09-09: qty 1

## 2017-09-09 MED ORDER — IOPAMIDOL (ISOVUE-370) INJECTION 76%
100.0000 mL | Freq: Once | INTRAVENOUS | Status: AC | PRN
  Administered 2017-09-09: 84 mL via INTRAVENOUS

## 2017-09-09 MED ORDER — ACETAMINOPHEN 500 MG PO TABS
1000.0000 mg | ORAL_TABLET | Freq: Once | ORAL | Status: AC
Start: 2017-09-09 — End: 2017-09-09
  Administered 2017-09-09: 1000 mg via ORAL
  Filled 2017-09-09: qty 2

## 2017-09-09 MED ORDER — MORPHINE SULFATE ER 15 MG PO TBCR
60.0000 mg | EXTENDED_RELEASE_TABLET | Freq: Two times a day (BID) | ORAL | Status: DC
  Administered 2017-09-10 (×2): 60 mg via ORAL
  Filled 2017-09-09: qty 4
  Filled 2017-09-09: qty 1
  Filled 2017-09-09: qty 4

## 2017-09-09 MED ORDER — OXYCODONE HCL 5 MG PO TABS
20.0000 mg | ORAL_TABLET | Freq: Once | ORAL | Status: AC
  Administered 2017-09-10: 20 mg via ORAL
  Filled 2017-09-09: qty 4

## 2017-09-09 NOTE — ED Triage Notes (Signed)
C/o CP x 3-4 hours-presents to triage in w/c

## 2017-09-09 NOTE — ED Notes (Signed)
Patient transported to X-ray 

## 2017-09-09 NOTE — ED Provider Notes (Signed)
[facility] EMERGENCY DEPARTMENT Provider Note   CSN: [id number] Arrival date & time: 09/09/17  1856     History   Chief Complaint Chief Complaint  Patient presents with  . Chest Pain    HPI [name] is a 62 y.o. male.  HPI   62 year old male presenting with chest pain till and pain to left shoulder.  Patient is a patient of Dr. Jacinto HalimGanji.  Reports that he last saw him in June.  He reports normal stress test over a year ago.  The patient's chest pain associated with diaphoresis and mild shortness of breath. The chest pain has been constant.  It feels pressing and radiates to left shoulder.   Patient also had small PE in the past.  Patient was on Xarelto for 6 months, and is no longer.  No other risk factors for PE at this time.   Past Medical History:  Diagnosis Date  . Adjustment disorder with depressed mood   . Anxiety   . Atrial fibrillation (HCC) dx'd 04/2015  . Chronic bronchitis (HCC)    "used to get it q yr; nothing in the last ~ 5 yrs" (08/15/2015)  . Chronic knee pain    "both sides"  . Chronic left shoulder pain   . Chronic pain   . Depression   . 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 GERD (gastroesophageal reflux disease)   . Hyperlipemia   . Hypertension   . Migraine    "q 3 months" (08/15/2015)  . Osteoarthritis of both knees   . Peripheral vascular disease (HCC)   . PUD (peptic ulcer disease)   . Pulmonary emboli (HCC)   . Wrist osteomyelitis, left (HCC) 06/05/2015    Patient Active Problem List   Diagnosis Date Noted  . Drug-seeking behavior 04/04/2016  . Acute pulmonary embolism (HCC)   . SOB (shortness of breath)   . PE (pulmonary embolism) 01/27/2016  . Pulmonary embolism (HCC) 01/27/2016  . Dysrhythmia 01/27/2016  . Chronic venous insufficiency 01/17/2016  . Abnormality of gait 08/21/2015  . Fracture of left tibia 08/21/2015  . HLD (hyperlipidemia)   . Chronic diastolic congestive heart failure (HCC)   . Depression   .  Post-operative pain   . Essential hypertension   . Paroxysmal SVT (supraventricular tachycardia) (HCC)   . Osteomyelitis of left wrist (HCC)   . History of amputation of lesser toe of right foot (HCC)   . Fracture of shaft of left tibia 08/15/2015  . Fracture, tibia, shaft 08/15/2015  . Acute osteomyelitis of right radius (HCC)   . Osteomyelitis (HCC) 07/05/2015  . Paroxysmal atrial fibrillation (HCC)   . 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 Wound of right ankle 06/05/2015  . Status post amputation of great toe (HCC)   . Pain   . Swelling   . Warmth of joint   . Mycotic aneurysm (HCC)   . Thigh abscess   . Hand abscess   . Septic embolism (HCC)   . Gangrene of toe (HCC)   . Acute osteomyelitis of left ulna (HCC)   . Streptococcal arthritis of left wrist (HCC)   . Dry gangrene (HCC)   . Wound, open, foot with complication   . Acute deep vein thrombosis (DVT) of distal end of right lower extremity (HCC)   . Left wrist fracture   . Abscess of bursa, left wrist   . SVT (supraventricular tachycardia) (HCC) 05/08/2015  . 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 Adjustment disorder with depressed mood   . 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 Debility 05/01/2015  . Sepsis (HCC) 04/30/2015  . Acute renal failure with tubular necrosis (HCC)   . Abscess of left hand   . Staphylococcus aureus bacteremia with sepsis (HCC)   . Colles' fracture of left radius   . Cellulitis of left upper extremity   . Diastolic dysfunction   . Nondisplaced fracture of distal end of left radius   . Abscess   . Bacteremia due to Staphylococcus   . Pulmonary hypertension (HCC)   . Acute blood loss anemia   . Aneurysm of right femoral artery (HCC)   . DVT, lower extremity (HCC)   . Radius distal fracture   . Cellulitis of finger of left hand   . Acute pyelonephritis   . Arm pain, anterior   . Cellulitis   . Bacteremia due  to Staphylococcus aureus   . Acute renal failure syndrome (HCC)   . Fracture of left radius   . ARF (acute renal failure) (HCC) 04/07/2015  . Severe sepsis (HCC) 04/07/2015  . Cellulitis of left hand 04/07/2015  . Elevated INR 04/07/2015  . Foot swelling 04/07/2015  . Elevated bilirubin 04/07/2015  . Hypokalemia 04/07/2015  . Hypomagnesemia 04/07/2015  . 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 Bacteremia 04/07/2015  . UTI (lower urinary tract infection)   . Chest pain 11/15/2013  . History of duodenal ulcer 11/15/2013  . 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 Septic shock due to abscess 07/13/2013  . Chronic use of opiate drugs therapeutic purposes 07/12/2013  . Acute renal failure (HCC) 07/11/2013  . Chronic pain syndrome 07/11/2013  . Testosterone deficiency 07/11/2013    Past Surgical History:  Procedure Laterality Date  . AMPUTATION Right 05/17/2015   Procedure: Right 1st and 2nd Toe Amputation;  Surgeon: Nadara Mustard, MD;  Location: [facility] OR;  Service: Orthopedics;  Laterality: Right;  . APPENDECTOMY  ~ 1980  . CARDIAC CATHETERIZATION  1993   "Dr. [place]; for vasospasms"  . CHOLECYSTECTOMY  ~ 1982  . ELBOW ARTHROSCOPY WITH TENDON RECONSTRUCTION Right 1990  . FEMORAL ARTERY EXPLORATION Right 04/11/2015   Procedure: Resection of infected right femoral artery aneurysm;  Surgeon: Fransisco Hertz, MD;  Location: [facility] OR;  Service: Vascular;  Laterality: Right;  . FEMORAL REVISION Right 04/19/2015   Procedure: OVERSEW RIGHT FEMORAL VEIN ;  Surgeon: Chuck Hint, MD;  Location: [facility] OR;  Service: Vascular;  Laterality: Right;  . HEMORRHOIDECTOMY WITH HEMORRHOID BANDING  ~ 1985  . I&D EXTREMITY Left 07/12/2013   Procedure: I&D Left Thigh Abscess;  Surgeon: [name], MD;  Location: [facility] OR;  Service: Orthopedics;  Laterality: Left;  . I&D EXTREMITY Left 04/17/2015   Procedure: IRRIGATION AND DEBRIDEMENT LEFT EXTREMITY;  Surgeon: Fransisco Hertz, MD;  Location: [facility] OR;  Service:  Vascular;  Laterality: Left;  . I&D EXTREMITY Left 07/09/2015   Procedure: IRRIGATION AND DEBRIDEMENT LEFT WRIST;  Surgeon: Knute Neu, MD;  Location: [facility] OR;  Service: Plastics;  Laterality: Left;  . I&D EXTREMITY Left 08/08/2015   Procedure: IRRIGATION AND DEBRIDEMENT EXTREMITY;  Surgeon: Knute Neu, MD;  Location: [facility] OR;  Service: Plastics;  Laterality: Left;  . INCISION AND DRAINAGE ABSCESS Right 04/11/2015   Procedure: INCISION AND DRAINAGE OF RIGHT THIGH ABSCESS;  Surgeon: Fransisco Hertz, MD;  Location: [facility] OR;  Service: Vascular;  Laterality: Right;  . INCISION AND DRAINAGE ABSCESS Right  04/13/2015   Procedure: INCISION AND DRAINAGE THIGH ABSCESS;  Surgeon: Fransisco Hertz, MD;  Location: [facility] OR;  Service: Vascular;  Laterality: Right;  . INGUINAL HERNIA REPAIR Right ~ 1969  . JOINT REPLACEMENT    . KNEE ARTHROSCOPY Bilateral 1985-2008   "left-right"  . MUSCLE FLAP CLOSURE Right 04/19/2015   Procedure: RIGHT RECTUS ABDOMINUS  FLAP TO RIGHT GROIN;  Surgeon: Glenna Fellows, MD;  Location: [facility] OR;  Service: Plastics;  Laterality: Right;  . ORIF TIBIA FRACTURE Left 08/17/2015   Procedure: OPEN REDUCTION INTERNAL FIXATION (ORIF) TIBIA FRACTURE;  Surgeon: Kathryne Hitch, MD;  Location: [facility] OR;  Service: Orthopedics;  Laterality: Left;  . REPLACEMENT TOTAL KNEE BILATERAL Bilateral ~ 2007-2013"   "left-right"  . SHOULDER ARTHROSCOPY W/ ROTATOR CUFF REPAIR Bilateral 1992-1993   "right-left"  . TONSILLECTOMY  ~ 1972  . UVULOPALATOPLASTY  1991  . WOUND EXPLORATION Right 04/23/2015   Procedure: EXAM UNDER ANESTHESIA AND VAC CHANGE RIGHT THIGH;  Surgeon: Glenna Fellows, MD;  Location: [facility] OR;  Service: Plastics;  Laterality: Right;       Home Medications    Prior to Admission medications   Medication Sig Start Date End Date Taking? Authorizing Provider  GABAPENTIN PO Take by mouth.   Yes [provider]  lisinopril (PRINIVIL,ZESTRIL) 20 MG tablet Take 20 mg by mouth daily.    Yes [provider]  albuterol (PROAIR HFA) 108 (90 BASE) MCG/ACT inhaler Inhale 1 puff into the lungs every 6 (six) hours as needed for wheezing or shortness of breath.  09/06/13   [provider]  atorvastatin (LIPITOR) 80 MG tablet Take 1 tablet (80 mg total) by mouth daily. 08/30/15   Angiulli, Mcarthur Rossetti, PA-C  famciclovir (FAMVIR) 500 MG tablet Take 500 mg by mouth 2 (two) times daily as needed. ulcers 10/25/13   [provider]  morphine (MS CONTIN) 60 MG 12 hr tablet Take 60 mg by mouth 2 (two) times daily. 01/11/16   [provider]  naloxegol oxalate (MOVANTIK) 25 MG TABS tablet Take 1 tablet (25 mg total) by mouth daily before breakfast. Patient taking differently: Take 25 mg by mouth daily as needed (open induced constipation).  05/24/15   Angiulli, Mcarthur Rossetti, PA-C  ondansetron (ZOFRAN) 8 MG tablet Take 8 mg by mouth every 8 (eight) hours as needed. nausea 12/18/15   [provider]  oxyCODONE (ROXICODONE) 30 MG immediate release tablet Take 1 tablet (30 mg total) by mouth every 4 (four) hours as needed for moderate pain. Patient taking differently: Take 20 mg by mouth 3 (three) times daily.  08/30/15   Angiulli, Mcarthur Rossetti, PA-C  temazepam (RESTORIL) 30 MG capsule Take 30 mg by mouth at bedtime. 01/24/16   [provider]  trifluridine (VIROPTIC) 1 % ophthalmic solution Place 1 drop into both eyes 2 (two) times daily as needed (breakouts (blurry, matted eyelids)).    [provider]  venlafaxine XR (EFFEXOR-XR) 75 MG 24 hr capsule Take 1 capsule (75 mg total) by mouth daily with breakfast. Patient taking differently: Take 75 mg by mouth 2 (two) times daily.  08/30/15   Angiulli, Mcarthur Rossetti, PA-C  diphenhydrAMINE (BENADRYL) 25 MG tablet Take 1 tablet (25 mg total) by mouth every 6 (six) hours. Patient not taking: Reported on 02/25/2015 10/05/14 04/01/15  Gwyneth Sprout, MD  pantoprazole (PROTONIX) 40 MG tablet Take 1 tablet (40 mg total) by mouth  daily. Patient taking differently: Take 40 mg by mouth daily as needed (heartburn).  11/17/13 04/01/15  Kathlen Mody, MD    Family History Family History  Problem Relation Age of Onset  . Heart attack Paternal Grandmother 19  . Heart disease Mother 35       arrythmia  . Stroke Paternal Grandfather   . Stroke Maternal Grandfather     Social History Social History   Tobacco Use  . Smoking status: Former Smoker    Packs/day: 1.00    Years: 3.00    Pack years: 3.00    Types: Cigarettes    Last attempt to quit: 01/17/1971    Years since quitting: 46.6  . Smokeless tobacco: Never Used  . Tobacco comment: "quit smoking cigarettes in 1979  Substance Use Topics  . Alcohol use: No  . Drug use: No     Allergies   Verapamil; Imitrex [sumatriptan]; and Duloxetine hcl   Review of Systems Review of Systems  Constitutional: Negative for activity change, fatigue and fever.  HENT: Negative for congestion.   Respiratory: Positive for chest tightness and shortness of breath.   Cardiovascular: Positive for chest pain. Negative for palpitations and leg swelling.  Gastrointestinal: Negative for abdominal pain.  All other systems reviewed and are negative.    Physical Exam Updated Vital Signs BP 123/82 (BP Location: Right Arm)   Pulse 91   Temp 98.6 F (37 C) (Oral)   Resp 20   Ht 6\' 3"  (1.905 m)   Wt (!) 140.6 kg (310 lb)   SpO2 97%   BMI 38.75 kg/m   Physical Exam  Constitutional: He is oriented to person, place, and time. He appears well-nourished.  HENT:  Head: Normocephalic.  Eyes: Conjunctivae are normal.  Cardiovascular: Normal rate, regular rhythm, intact distal pulses and normal pulses. Exam reveals no distant heart sounds.  No murmur heard. Pulmonary/Chest: Effort normal and breath sounds normal. He has no decreased breath sounds.  Abdominal: Soft. He exhibits no distension.  Musculoskeletal:  Scar to left ankle.  Neurological: He is oriented to person, place,  and time.  Skin: Skin is warm and dry. He is not diaphoretic.  Psychiatric: He has a normal mood and affect. His behavior is normal.  Nursing note and vitals reviewed.    ED Treatments / Results  Labs (all labs ordered are listed, but only abnormal results are displayed) Labs Reviewed  BASIC METABOLIC PANEL  CBC  TROPONIN I  D-DIMER, QUANTITATIVE (NOT AT [facility])    EKG  EKG Interpretation  Date/Time:  Thursday September 09 2017 19:02:23 EST Ventricular Rate:  91 PR Interval:  192 QRS Duration: 96 QT Interval:  338 QTC Calculation: 415 R Axis:   -2 Text Interpretation:  Normal sinus rhythm Normal ECG minimal st elevations in V2, no reciprical changes Confirmed by Bary Castilla ([id number]) on 09/09/2017 7:09:21 PM       Radiology Dg Chest 2 View  Result Date: 09/09/2017 CLINICAL DATA:  Chest pain EXAM: CHEST  2 VIEW COMPARISON:  01/27/2016 FINDINGS: Blunting of the lateral and posterior left costophrenic sulcus. No Kerley lines or air bronchogram. Large lung volumes with mild diaphragm flattening. Normal heart size when accounting for apical fat pad. IMPRESSION: 1. Trace left pleural effusion. 2. Otherwise stable from 2017. Electronically Signed   By: Marnee Spring M.D.   On: 09/09/2017 19:36    Procedures Procedures (including critical care time)  Medications Ordered in ED Medications  aspirin chewable tablet 324 mg (not administered)     Initial Impression / Assessment and Plan / ED Course  I have reviewed the triage vital signs and the nursing notes.  Pertinent labs & imaging results that were available during my care of the patient were reviewed by me and considered in my medical decision making (see chart for details).     62 year old male presenting with chest pain till and pain to left shoulder.  Patient is a patient of Dr. Jacinto Halim.  Reports that he last saw him in June.  He reports normal stress test over a year ago.  The patient's chest pain associated with  diaphoresis and mild shortness of breath. The chest pain has been constant.  It feels pressing and radiates to left shoulder.   Patient also had small PE in the past.  Patient was on Xarelto for 6 months, and is no longer.  No other risk factors for PE at this time.  7:43 PM Patient's hypertension hyperlipidemia diabetes and chest pain radiating to left shoulder.  Concerning for cardiac chest pain.  Initial EKG shows no STEMI.  Will get labs, given history of PE will order d-dimer.  Then will touch base with Dr. Jacinto Halim. Elevated d-dimer, CT angios shows no evidence of pulmonary embolism.  Gave sublingual nitro which helped the pain.  10:08 PM Discussed with Dr. Dawna Part partner. Agrees plan to admit to medicine  for serial troponins.   Final Clinical Impressions(s) / ED Diagnoses   Final diagnoses:  None    ED Discharge Orders    None       Yannet Rincon, Cindee Salt, MD 09/09/17 2210

## 2017-09-10 ENCOUNTER — Observation Stay (HOSPITAL_COMMUNITY): Payer: Medicare HMO

## 2017-09-10 DIAGNOSIS — R079 Chest pain, unspecified: Secondary | ICD-10-CM | POA: Diagnosis not present

## 2017-09-10 DIAGNOSIS — I110 Hypertensive heart disease with heart failure: Secondary | ICD-10-CM | POA: Diagnosis not present

## 2017-09-10 DIAGNOSIS — Z79899 Other long term (current) drug therapy: Secondary | ICD-10-CM | POA: Diagnosis not present

## 2017-09-10 DIAGNOSIS — I5032 Chronic diastolic (congestive) heart failure: Secondary | ICD-10-CM | POA: Diagnosis not present

## 2017-09-10 LAB — HIV ANTIBODY (ROUTINE TESTING W REFLEX): HIV SCREEN 4TH GENERATION: NONREACTIVE

## 2017-09-10 LAB — ECHOCARDIOGRAM COMPLETE
Height: 75 in
Weight: 4966.4 oz

## 2017-09-10 LAB — CBC
HEMATOCRIT: 45.6 % (ref 39.0–52.0)
HEMOGLOBIN: 14.9 g/dL (ref 13.0–17.0)
MCH: 30.2 pg (ref 26.0–34.0)
MCHC: 32.7 g/dL (ref 30.0–36.0)
MCV: 92.5 fL (ref 78.0–100.0)
Platelets: 209 10*3/uL (ref 150–400)
RBC: 4.93 MIL/uL (ref 4.22–5.81)
RDW: 14.1 % (ref 11.5–15.5)
WBC: 8.9 10*3/uL (ref 4.0–10.5)

## 2017-09-10 LAB — CREATININE, SERUM
Creatinine, Ser: 1.09 mg/dL (ref 0.61–1.24)
GFR calc Af Amer: >60 mL/min (ref >60)
GFR calc non Af Amer: >60 mL/min (ref >60)

## 2017-09-10 LAB — TROPONIN I
Troponin I: <0.03 ng/mL (ref <0.03)
Troponin I: 0.06 ng/mL (ref <0.03)

## 2017-09-10 MED ORDER — ONDANSETRON HCL 4 MG/2ML IJ SOLN: 4.0000 mg | Freq: Four times a day (QID) | INTRAMUSCULAR | Status: DC | PRN

## 2017-09-10 MED ORDER — ENOXAPARIN SODIUM 40 MG/0.4ML ~~LOC~~ SOLN
40.0000 mg | Freq: Every day | SUBCUTANEOUS | Status: DC
  Administered 2017-09-10: 40 mg via SUBCUTANEOUS
  Filled 2017-09-10: qty 0.4

## 2017-09-10 MED ORDER — LISINOPRIL 20 MG PO TABS
20.0000 mg | ORAL_TABLET | Freq: Every day | ORAL | Status: DC
  Administered 2017-09-10: 20 mg via ORAL
  Filled 2017-09-10: qty 1

## 2017-09-10 MED ORDER — OXYCODONE HCL 5 MG PO TABS
20.0000 mg | ORAL_TABLET | Freq: Four times a day (QID) | ORAL | Status: DC | PRN
  Administered 2017-09-10 (×2): 20 mg via ORAL
  Filled 2017-09-10 (×2): qty 4

## 2017-09-10 MED ORDER — ASPIRIN EC 325 MG PO TBEC
325.0000 mg | DELAYED_RELEASE_TABLET | Freq: Every day | ORAL | Status: DC
  Administered 2017-09-10: 325 mg via ORAL
  Filled 2017-09-10: qty 1

## 2017-09-10 MED ORDER — NITROGLYCERIN 0.4 MG SL SUBL: 0.4000 mg | SUBLINGUAL_TABLET | SUBLINGUAL | 12 refills | Status: AC | PRN

## 2017-09-10 MED ORDER — TEMAZEPAM 15 MG PO CAPS
30.0000 mg | ORAL_CAPSULE | Freq: Every day | ORAL | Status: DC
  Administered 2017-09-10: 30 mg via ORAL
  Filled 2017-09-10: qty 2

## 2017-09-10 MED ORDER — NALOXEGOL OXALATE 25 MG PO TABS: 25.0000 mg | ORAL_TABLET | Freq: Every day | ORAL | Status: DC | PRN

## 2017-09-10 MED ORDER — TECHNETIUM TC 99M TETROFOSMIN IV KIT
10.0000 | PACK | Freq: Once | INTRAVENOUS | Status: AC | PRN
  Administered 2017-09-10: 10 via INTRAVENOUS

## 2017-09-10 MED ORDER — PERFLUTREN LIPID MICROSPHERE
1.0000 mL | INTRAVENOUS | Status: AC | PRN
  Administered 2017-09-10: 3 mL via INTRAVENOUS

## 2017-09-10 MED ORDER — MORPHINE SULFATE (PF) 2 MG/ML IV SOLN
2.0000 mg | Freq: Once | INTRAVENOUS | Status: AC
  Administered 2017-09-10: 2 mg via INTRAVENOUS
  Filled 2017-09-10: qty 1

## 2017-09-10 MED ORDER — TECHNETIUM TC 99M TETROFOSMIN IV KIT
30.0000 | PACK | Freq: Once | INTRAVENOUS | Status: AC | PRN
  Administered 2017-09-10: 30 via INTRAVENOUS

## 2017-09-10 MED ORDER — REGADENOSON 0.4 MG/5ML IV SOLN
0.4000 mg | Freq: Once | INTRAVENOUS | Status: AC
  Administered 2017-09-10: 0.4 mg via INTRAVENOUS
  Filled 2017-09-10: qty 5

## 2017-09-10 MED ORDER — ACETAMINOPHEN 325 MG PO TABS: 650.0000 mg | ORAL_TABLET | ORAL | Status: DC | PRN

## 2017-09-10 MED ORDER — REGADENOSON 0.4 MG/5ML IV SOLN
INTRAVENOUS | Status: AC
  Filled 2017-09-10: qty 5

## 2017-09-10 MED ORDER — VENLAFAXINE HCL ER 75 MG PO CP24
75.0000 mg | ORAL_CAPSULE | Freq: Two times a day (BID) | ORAL | Status: DC
  Filled 2017-09-10 (×2): qty 1

## 2017-09-10 NOTE — H&P (Addendum)
History and Physical    Craig MunchJimmy F Gorka ZOX:[id number] DOB: December 31, 1955 DOA: 09/09/2017  PCP: Patria ManeGassemi, Mike, MD  Patient coming from: Home.  Chief Complaint: Chest pain.  HPI: Craig MunchJimmy F [name] is a 62 y.o. male with previous history of pulmonary embolism diagnosed in 2017 had taken anticoagulation for 1 year and stopped, history of hypertension and SVT presents to the ER with complaints of chest pain.  Patient has been having left-sided chest pain since yesterday afternoon.  Pain is mostly stabbing in nature nonradiating no associated shortness of breath fever chills or productive cough.  Pain radiates to the left arm.  No exertional symptoms.  ED Course: In the ER patient had blood test done which showed mildly elevated d-dimer and patient had CT angiogram of the chest done which only shows residual thrombus in the right side.  Nothing acute.  EKG was showing normal sinus rhythm with normal specific changes.  Patient's chest pain improved with sublingual nitroglycerin but at the time of my exam he started occurring again.  Patient admitted for further management of chest pain.  Review of Systems: As per HPI, rest all negative.   Past Medical History:  Diagnosis Date  . Adjustment disorder with depressed mood   . Anxiety   . Atrial fibrillation (HCC) dx'd 04/2015  . Chronic bronchitis (HCC)    "used to get it q yr; nothing in the last ~ 5 yrs" (08/15/2015)  . Chronic knee pain    "both sides"  . Chronic left shoulder pain   . Chronic pain   . Depression   . 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 GERD (gastroesophageal reflux disease)   . Hyperlipemia   . Hypertension   . Migraine    "q 3 months" (08/15/2015)  . Osteoarthritis of both knees   . Peripheral vascular disease (HCC)   . PUD (peptic ulcer disease)   . Pulmonary emboli (HCC)   . 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 AMPUTATION Right 05/17/2015   Procedure:  Right 1st and 2nd Toe Amputation;  Surgeon: Nadara MustardMarcus Duda V, MD;  [facility];  Service: Orthopedics;  Laterality: Right;  . APPENDECTOMY  ~ 1980  . CARDIAC CATHETERIZATION  1993   "Dr. SwazilandJordan; for vasospasms"  . CHOLECYSTECTOMY  ~ 1982  . ELBOW ARTHROSCOPY WITH TENDON RECONSTRUCTION Right 1990  . 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 FEMORAL REVISION Right 04/19/2015   Procedure: OVERSEW RIGHT FEMORAL VEIN ;  Surgeon: Chuck Hinthristopher S Dickson, MD;  [facility];  Service: Vascular;  Laterality: Right;  . HEMORRHOIDECTOMY WITH HEMORRHOID BANDING  ~ 1985  . I&D EXTREMITY Left 07/12/2013   Procedure: I&D Left Thigh Abscess;  Surgeon: Toni ArthursJohn [name], MD;  [facility];  Service: Orthopedics;  Laterality: Left;  . I&D EXTREMITY Left 04/17/2015   Procedure: IRRIGATION AND DEBRIDEMENT LEFT EXTREMITY;  Surgeon: Fransisco HertzBrian L Chen, MD;  [facility];  Service: Vascular;  Laterality: Left;  . I&D EXTREMITY Left 07/09/2015   Procedure: IRRIGATION AND DEBRIDEMENT LEFT WRIST;  Surgeon: Knute NeuHarrill Coley, MD;  Location: [facility] OR;  Service: Plastics;  Laterality: Left;  . I&D EXTREMITY Left 08/08/2015   Procedure: IRRIGATION AND DEBRIDEMENT EXTREMITY;  Surgeon: Knute NeuHarrill Coley, MD;  Location: [facility] OR;  Service: Plastics;  Laterality: Left;  .  INCISION AND DRAINAGE ABSCESS Right 04/11/2015   Procedure: INCISION AND DRAINAGE OF RIGHT THIGH ABSCESS;  Surgeon: Fransisco Hertz, MD;  Location[facility];  Service: Vascular;  Laterality: Right;  . INCISION AND DRAINAGE ABSCESS Right 04/13/2015   Procedure: INCISION AND DRAINAGE THIGH ABSCESS;  Surgeon: Fransisco Hertz, MD;  Location: [facility] OR;  Service: Vascular;  Laterality: Right;  . INGUINAL HERNIA REPAIR Right ~ 1969  . JOINT REPLACEMENT    . KNEE ARTHROSCOPY Bilateral 1985-2008   "left-right"  . MUSCLE FLAP CLOSURE Right  04/19/2015   Procedure: RIGHT RECTUS ABDOMINUS  FLAP TO RIGHT GROIN;  Surgeon: Glenna Fellows, MD;  Location: [facility] OR;  Service: Plastics;  Laterality: Right;  . ORIF TIBIA FRACTURE Left 08/17/2015   Procedure: OPEN REDUCTION INTERNAL FIXATION (ORIF) TIBIA FRACTURE;  Surgeon: Kathryne Hitch, MD;  Location: [facility] OR;  Service: Orthopedics;  Laterality: Left;  . REPLACEMENT TOTAL KNEE BILATERAL Bilateral ~ 2007-2013"   "left-right"  . SHOULDER ARTHROSCOPY W/ ROTATOR CUFF REPAIR Bilateral 1992-1993   "right-left"  . TONSILLECTOMY  ~ 1972  . UVULOPALATOPLASTY  1991  . WOUND EXPLORATION Right 04/23/2015   Procedure: EXAM UNDER ANESTHESIA AND VAC CHANGE RIGHT THIGH;  Surgeon: Glenna Fellows, MD;  Location: [facility] OR;  Service: Plastics;  Laterality: Right;     reports that he quit smoking about 46 years ago. His smoking use included cigarettes. He has a 3.00 pack-year smoking history. he has never used smokeless tobacco. He reports that he does not drink alcohol or use drugs.  Allergies  Allergen Reactions  . Verapamil Other (See Comments)    Causes pvc's  . Imitrex [Sumatriptan] Other (See Comments)    Chest pain, 2004 (tolerates 50 mg prn)  . Duloxetine Hcl     Psychosis    Family History  Problem Relation Age of Onset  . Heart attack Paternal Grandmother 24  . Heart disease Mother 98       arrythmia  . Stroke Paternal Grandfather   . Stroke Maternal Grandfather     Prior to Admission medications   Medication Sig Start Date End Date Taking? Authorizing Provider  GABAPENTIN PO Take by mouth.   Yes [provider]  lisinopril (PRINIVIL,ZESTRIL) 20 MG tablet Take 20 mg by mouth daily.   Yes [provider]  albuterol (PROAIR HFA) 108 (90 BASE) MCG/ACT inhaler Inhale 1 puff into the lungs every 6 (six) hours as needed for wheezing or shortness of breath.  09/06/13   [provider]  atorvastatin (LIPITOR) 80 MG tablet Take 1 tablet (80 mg total) by mouth  daily. 08/30/15   Angiulli, Mcarthur Rossetti, PA-C  famciclovir (FAMVIR) 500 MG tablet Take 500 mg by mouth 2 (two) times daily as needed. ulcers 10/25/13   [provider]  morphine (MS CONTIN) 60 MG 12 hr tablet Take 60 mg by mouth 2 (two) times daily. 01/11/16   [provider]  naloxegol oxalate (MOVANTIK) 25 MG TABS tablet Take 1 tablet (25 mg total) by mouth daily before breakfast. Patient taking differently: Take 25 mg by mouth daily as needed (open induced constipation).  05/24/15   Angiulli, Mcarthur Rossetti, PA-C  ondansetron (ZOFRAN) 8 MG tablet Take 8 mg by mouth every 8 (eight) hours as needed. nausea 12/18/15   [provider]  oxyCODONE (ROXICODONE) 30 MG immediate release tablet Take 1 tablet (30 mg total) by mouth every 4 (four) hours as needed for moderate pain. Patient taking differently: Take 20 mg by mouth  3 (three) times daily.  08/30/15   Angiulli, Mcarthur Rossetti, PA-C  temazepam (RESTORIL) 30 MG capsule Take 30 mg by mouth at bedtime. 01/24/16   [provider]  trifluridine (VIROPTIC) 1 % ophthalmic solution Place 1 drop into both eyes 2 (two) times daily as needed (breakouts (blurry, matted eyelids)).    [provider]  venlafaxine XR (EFFEXOR-XR) 75 MG 24 hr capsule Take 1 capsule (75 mg total) by mouth daily with breakfast. Patient taking differently: Take 75 mg by mouth 2 (two) times daily.  08/30/15   Angiulli, Mcarthur Rossetti, PA-C  diphenhydrAMINE (BENADRYL) 25 MG tablet Take 1 tablet (25 mg total) by mouth every 6 (six) hours. Patient not taking: Reported on 02/25/2015 10/05/14 04/01/15  Gwyneth Sprout, MD  pantoprazole (PROTONIX) 40 MG tablet Take 1 tablet (40 mg total) by mouth daily. Patient taking differently: Take 40 mg by mouth daily as needed (heartburn).  11/17/13 04/01/15  Kathlen Mody, MD    Physical Exam: Vitals:   09/09/17 2330 09/10/17 0008 09/10/17 0136 09/10/17 0158  BP: 107/72 108/81 123/79 116/80  Pulse: 85 79 70   Resp: 18 13    Temp:   98  F (36.7 C)   TempSrc:   Oral   SpO2: 96% 99%    Weight:      Height:          Constitutional: Moderately built and nourished. Vitals:   09/09/17 2330 09/10/17 0008 09/10/17 0136 09/10/17 0158  BP: 107/72 108/81 123/79 116/80  Pulse: 85 79 70   Resp: 18 13    Temp:   98 F (36.7 C)   TempSrc:   Oral   SpO2: 96% 99%    Weight:      Height:       Eyes: Anicteric no pallor. ENMT: No discharge from the ears eyes nose or mouth. Neck: No mass felt.  No JVD appreciated. Respiratory: No rhonchi or crepitations. Cardiovascular: S1-S2 heard no murmurs appreciated. Abdomen: Soft nontender bowel sounds present. Musculoskeletal: No edema.  No joint effusion. Skin: No rash.  Skin appears warm. Neurologic: Alert awake oriented to time place and person.  Moves all extremities. Psychiatric: Appears normal.  Normal affect.   Labs on Admission: I have personally reviewed following labs and imaging studies  CBC: Recent Labs  Lab 09/09/17 2003  WBC 8.6  HGB 14.5  HCT 43.9  MCV 91.5  PLT 201   Basic Metabolic Panel: Recent Labs  Lab 09/09/17 2003  NA 134*  K 4.1  CL 98*  CO2 26  GLUCOSE 119*  BUN 15  CREATININE 1.22  CALCIUM 8.9   GFR: Estimated Creatinine Clearance: 98.6 mL/min (by C-G formula based on SCr of 1.22 mg/dL). Liver Function Tests: No results for input(s): AST, ALT, ALKPHOS, BILITOT, PROT, ALBUMIN in the last 168 hours. No results for input(s): LIPASE, AMYLASE in the last 168 hours. No results for input(s): AMMONIA in the last 168 hours. Coagulation Profile: No results for input(s): INR, PROTIME in the last 168 hours. Cardiac Enzymes: Recent Labs  Lab 09/09/17 2003  TROPONINI <0.03   BNP (last 3 results) No results for input(s): PROBNP in the last 8760 hours. HbA1C: No results for input(s): HGBA1C in the last 72 hours. CBG: No results for input(s): GLUCAP in the last 168 hours. Lipid Profile: No results for input(s): CHOL, HDL, LDLCALC, TRIG,  CHOLHDL, LDLDIRECT in the last 72 hours. Thyroid Function Tests: No results for input(s): TSH, T4TOTAL, FREET4, T3FREE, THYROIDAB in the  last 72 hours. Anemia Panel: No results for input(s): VITAMINB12, FOLATE, FERRITIN, TIBC, IRON, RETICCTPCT in the last 72 hours. Urine analysis:    Component Value Date/Time   COLORURINE YELLOW 08/04/2015 1217   APPEARANCEUR CLEAR 08/04/2015 1217   LABSPEC 1.016 08/04/2015 1217   PHURINE 5.5 08/04/2015 1217   GLUCOSEU NEGATIVE 08/04/2015 1217   HGBUR MODERATE (A) 08/04/2015 1217   BILIRUBINUR NEGATIVE 08/04/2015 1217   KETONESUR NEGATIVE 08/04/2015 1217   PROTEINUR NEGATIVE 08/04/2015 1217   UROBILINOGEN 1.0 05/08/2015 1942   NITRITE NEGATIVE 08/04/2015 1217   LEUKOCYTESUR NEGATIVE 08/04/2015 1217   Sepsis Labs: @LABRCNTIP (procalcitonin:4,lacticidven:4) )No results found for this or any previous visit (from the past 240 hour(s)).   Radiological Exams on Admission: Dg Chest 2 View  Result Date: 09/09/2017 CLINICAL DATA:  Chest pain EXAM: CHEST  2 VIEW COMPARISON:  01/27/2016 FINDINGS: Blunting of the lateral and posterior left costophrenic sulcus. No Kerley lines or air bronchogram. Large lung volumes with mild diaphragm flattening. Normal heart size when accounting for apical fat pad. IMPRESSION: 1. Trace left pleural effusion. 2. Otherwise stable from 2017. Electronically Signed   By: Marnee Spring M.D.   On: 09/09/2017 19:36   Ct Angio Chest Pe W And/or Wo Contrast  Result Date: 09/09/2017 CLINICAL DATA:  Chest pain today. History of pulmonary embolus. PE suspected, intermediate prob, positive D-dimer EXAM: CT ANGIOGRAPHY CHEST WITH CONTRAST TECHNIQUE: Multidetector CT imaging of the chest was performed using the standard protocol during bolus administration of intravenous contrast. Multiplanar CT image reconstructions and MIPs were obtained to evaluate the vascular anatomy. CONTRAST:  84mL ISOVUE-370 IOPAMIDOL (ISOVUE-370) INJECTION 76%  COMPARISON:  Chest radiograph earlier today.  Chest CT 01/27/2016 FINDINGS: Cardiovascular: Majority of the prior filling defects in the right lower lobe pulmonary arteries on prior exam have resolved. Small volume eccentric chronic thrombus is present in the right lower lobe pulmonary artery. No new filling defects. Heart size upper normal. There is a small pericardial effusion that is new, measuring up to 1 cm anteriorly. Thoracic aorta is normal in caliber with trace atherosclerosis. No aortic dissection. Mild aortic tortuosity. Mediastinum/Nodes: No enlarged mediastinal or hilar lymph nodes. The esophagus is decompressed. No dominant thyroid nodule. Lungs/Pleura: Small left pleural effusion with adjacent compressive atelectasis. Mild hypoventilatory atelectasis in the lung bases. No confluent consolidation. No pulmonary edema. No pulmonary mass. Upper Abdomen: Unchanged cyst in the left lobe of the liver. No acute abnormality. Postcholecystectomy. Musculoskeletal: There are no acute or suspicious osseous abnormalities. Mild degenerative change in the spine. Review of the MIP images confirms the above findings. IMPRESSION: 1. No acute pulmonary embolus. Majority of the prior right lower lobe pulmonary emboli have resolved, minimal residual chronic thrombus in the right lower lobe pulmonary artery. 2. Small left pleural effusion.  Small pericardial effusion. Electronically Signed   By: Rubye Oaks M.D.   On: 09/09/2017 21:36    EKG: Independently reviewed.  Normal sinus rhythm.  Assessment/Plan Active Problems:   Chest pain    1. Chest pain -patient chest pain is mostly pleuritic in nature increases on deep inspiration and stabbing in nature.  Appears atypical.  Patient states he has had a stress test done last June with Dr. Jacinto Halim.  As per the patient it was unremarkable.  We will cycle cardiac markers and since patient has mild pericardial effusion check 2D echo.  No definite signs of pericarditis  on EKG.  Aspirin pain relief medications.  Will discuss with Dr. Jacinto Halim.  Check sed rate  since there is mild pericardial effusion. 2. Hypertension on lisinopril. 3. History of SVT on beta-blockers. 4. Chronic pain -continue home regimen. 5. Previous history of PE.  CT angiogram this shows only residual thrombus.  Nothing acute.  Addendum -discussed with Dr. Jacinto Halim patient's cardiologist.  Also reviewed patient's CT angiogram results with cardiologist.   DVT prophylaxis: Lovenox. Code Status: Full code. Family Communication: Discussed with patient. Disposition Plan: Home. Consults called: None. Admission status: Observation.   Eduard Clos MD [facility] Pager [phone].  If 7PM-7AM, please contact night-coverage [email or web address] Password [email or web address]  09/10/2017, 2:45 AM

## 2017-09-10 NOTE — Progress Notes (Signed)
  Echocardiogram 2D Echocardiogram definity has been performed.  Leta JunglingCooper, Anshul Meddings M 09/10/2017, 1:55 PM

## 2017-09-10 NOTE — Consult Note (Signed)
Reason for Consult: Chest pain Referring Physician: Loraine Leriche, MD  [name] is an 62 y.o. male.  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  He also has history of pulmonary embolism on 01/27/2016 due to immobility and development of DVT.  He is now off of anticoagulation.  He had been doing well until yesterday.  He is now admitted with chest pain that started at 2 PM on 09/09/2017.  Chest pain described as left upper chest pain, constant, 7 out of 10 in intensity.  No relationship to taking deep breath or moving a certain way however felt worse when he stood up and walked.  States that when he presented to the emergency room he was given sublingual nitroglycerin which eased the pain immediately but nitroglycerin did not take completely the pain away and hence had to be given morphine which he felt better after.  He still has mild lingering left upper chest discomfort and left shoulder pain.  Denies any dyspnea, hemoptysis, worsening leg edema.  Past Medical History:  Diagnosis Date  . Adjustment disorder with depressed mood   . Anxiety   . Atrial fibrillation ([place]) dx'd 04/2015  . Chronic bronchitis ([place])    "used to get it q yr; nothing in the last ~ 5 yrs" (08/15/2015)  . Chronic knee pain    "both sides"  . Chronic left shoulder pain   . Chronic pain   . Depression   . Foot osteomyelitis, right ([place]) 06/05/2015  . GERD (gastroesophageal reflux disease)   . Hyperlipemia   . Hypertension   . Migraine    "q 3 months" (08/15/2015)  . Osteoarthritis of both knees   . Peripheral vascular disease ([place])   . PUD (peptic ulcer disease)   . Pulmonary emboli ([place])   . Wrist osteomyelitis, left ([place]) 06/05/2015    Past  Surgical History:  Procedure Laterality Date  . AMPUTATION Right 05/17/2015   Procedure: Right 1st and 2nd Toe Amputation;  Surgeon: Newt Minion, MD;  Location: [place];  Service: Orthopedics;  Laterality: Right;  . APPENDECTOMY  ~ 1980  . CARDIAC CATHETERIZATION  1993   "Dr. [place]; for vasospasms"  . CHOLECYSTECTOMY  ~ 1982  . ELBOW ARTHROSCOPY WITH TENDON RECONSTRUCTION Right 1990  . FEMORAL ARTERY EXPLORATION Right 04/11/2015   Procedure: Resection of infected right femoral artery aneurysm;  Surgeon: Conrad Vinton, MD;  Location: [place];  Service: Vascular;  Laterality: Right;  . FEMORAL REVISION Right 04/19/2015   Procedure: OVERSEW RIGHT FEMORAL VEIN ;  Surgeon: Angelia Mould, MD;  Location: [place];  Service: Vascular;  Laterality: Right;  . HEMORRHOIDECTOMY WITH HEMORRHOID BANDING  ~ 1985  . I&D EXTREMITY Left 07/12/2013   Procedure: I&D Left Thigh Abscess;  Surgeon: Wylene Simmer, MD;  Location: [place];  Service: Orthopedics;  Laterality: Left;  . I&D EXTREMITY Left 04/17/2015   Procedure: IRRIGATION AND DEBRIDEMENT LEFT EXTREMITY;  Surgeon: Conrad Bellaire, MD;  Location: [place];  Service: Vascular;  Laterality: Left;  . I&D EXTREMITY Left 07/09/2015   Procedure: IRRIGATION AND DEBRIDEMENT LEFT WRIST;  Surgeon: Dayna Barker, MD;  Location: [place];  Service: Plastics;  Laterality: Left;  . I&D EXTREMITY Left 08/08/2015   Procedure: IRRIGATION AND DEBRIDEMENT EXTREMITY;  Surgeon: Dayna Barker, MD;  Location: [place];  Service: Plastics;  Laterality: Left;  . INCISION AND DRAINAGE ABSCESS Right 04/11/2015   Procedure: INCISION AND DRAINAGE OF RIGHT THIGH ABSCESS;  Surgeon: Conrad [place], MD;  Location: [place];  Service: Vascular;  Laterality: Right;  . INCISION AND DRAINAGE ABSCESS Right 04/13/2015   Procedure: INCISION AND DRAINAGE THIGH ABSCESS;  Surgeon: Conrad Co[place]burg, MD;  Location: [place];  Service: Vascular;  Laterality: Right;  . INGUINAL HERNIA REPAIR Right ~ 1969  . JOINT REPLACEMENT     . KNEE ARTHROSCOPY Bilateral 1985-2008   "left-right"  . MUSCLE FLAP CLOSURE Right 04/19/2015   Procedure: RIGHT RECTUS ABDOMINUS  FLAP TO RIGHT GROIN;  Surgeon: Irene Limbo, MD;  Location: [place];  Service: Plastics;  Laterality: Right;  . ORIF TIBIA FRACTURE Left 08/17/2015   Procedure: OPEN REDUCTION INTERNAL FIXATION (ORIF) TIBIA FRACTURE;  Surgeon: Mcarthur Rossetti, MD;  Location: [place];  Service: Orthopedics;  Laterality: Left;  . REPLACEMENT TOTAL KNEE BILATERAL Bilateral ~ 2007-2013"   "left-right"  . SHOULDER ARTHROSCOPY W/ ROTATOR CUFF REPAIR Bilateral 1992-1993   "right-left"  . TONSILLECTOMY  ~ 1972  . UVULOPALATOPLASTY  1991  . WOUND EXPLORATION Right 04/23/2015   Procedure: EXAM UNDER ANESTHESIA AND VAC CHANGE RIGHT THIGH;  Surgeon: Irene Limbo, MD;  Location: [place];  Service: Plastics;  Laterality: Right;    Family History  Problem Relation Age of Onset  . Heart attack Paternal Grandmother 74  . Heart disease Mother 23       arrythmia  . Stroke Paternal Grandfather   . Stroke Maternal Grandfather     Social History:  reports that he quit smoking about 46 years ago. His smoking use included cigarettes. He has a 3.00 pack-year smoking history. he has never used smokeless tobacco. He reports that he does not drink alcohol or use drugs.  Allergies:  Allergies  Allergen Reactions  . Verapamil Other (See Comments)    Causes pvc's  . Imitrex [Sumatriptan] Other (See Comments)    Chest pain, 2004 (tolerates 50 mg prn)  . Duloxetine Hcl     Psychosis    Results for orders placed or performed during the hospital encounter of 09/09/17 (from the past 48 hour(s))  Basic metabolic panel     Status: Abnormal   Collection Time: 09/09/17  8:03 PM  Result Value Ref Range   Sodium 134 (L) 135 - 145 mmol/L   Potassium 4.1 3.5 - 5.1 mmol/L   Chloride 98 (L) 101 - 111 mmol/L   CO2 26 22 - 32 mmol/L   Glucose, Bld 119 (H) 65 - 99 mg/dL   BUN 15 6 - 20 mg/dL    Creatinine, Ser 1.22 0.61 - 1.24 mg/dL   Calcium 8.9 8.9 - 10.3 mg/dL   GFR calc non Af Amer >60 >60 mL/min   GFR calc Af Amer >60 >60 mL/min    Comment: (NOTE) The eGFR has been calculated using the CKD EPI equation. This calculation has not been validated in all clinical situations. eGFR's persistently <60 mL/min signify possible Chronic Kidney Disease.    Anion gap 10 5 - 15    Comment: Performed at [facility], [address] [place], [place] [address]  CBC     Status: None   Collection Time: 09/09/17  8:03 PM  Result Value Ref Range   WBC 8.6 4.0 - 10.5 K/uL   RBC 4.80 4.22 - 5.81 MIL/uL  Hemoglobin 14.5 13.0 - 17.0 g/dL   HCT 43.9 39.0 - 52.0 %   MCV 91.5 78.0 - 100.0 fL   MCH 30.2 26.0 - 34.0 pg   MCHC 33.0 30.0 - 36.0 g/dL   RDW 14.1 11.5 - 15.5 %   Platelets 201 150 - 400 K/uL    Comment: Performed at [facility], [address] [place], [place] [address]  Troponin I     Status: None   Collection Time: 09/09/17  8:03 PM  Result Value Ref Range   Troponin I <0.03 <0.03 ng/mL    Comment: Performed at [facility], [address] [place], [place] [address]  D-dimer, quantitative (not at [facility])     Status: Abnormal   Collection Time: 09/09/17  8:03 PM  Result Value Ref Range   D-Dimer, Quant 0.90 (H) 0.00 - 0.50 ug/mL-FEU    Comment: (NOTE) At the manufacturer cut-off of 0.50 ug/mL FEU, this assay has been documented to exclude PE with a sensitivity and negative predictive value of 97 to 99%.  At this time, this assay has not been approved by the FDA to exclude DVT/VTE. Results should be correlated with clinical presentation. Performed at [facility], [address] [place], [place] [address]   CBC     Status: None   Collection Time: 09/10/17  3:16 AM  Result Value Ref Range   WBC 8.9 4.0 - 10.5 K/uL   RBC 4.93 4.22 - 5.81 MIL/uL   Hemoglobin 14.9 13.0 - 17.0 g/dL   HCT 45.6 39.0 - 52.0 %   MCV 92.5 78.0 - 100.0  fL   MCH 30.2 26.0 - 34.0 pg   MCHC 32.7 30.0 - 36.0 g/dL   RDW 14.1 11.5 - 15.5 %   Platelets 209 150 - 400 K/uL    Comment: Performed at [facility], [place] [address], [place], [place] [address]  Creatinine, serum     Status: None   Collection Time: 09/10/17  3:16 AM  Result Value Ref Range   Creatinine, Ser 1.09 0.61 - 1.24 mg/dL   GFR calc non Af Amer >60 >60 mL/min   GFR calc Af Amer >60 >60 mL/min    Comment: (NOTE) The eGFR has been calculated using the CKD EPI equation. This calculation has not been validated in all clinical situations. eGFR's persistently <60 mL/min signify possible Chronic Kidney Disease. Performed at [facility], [place] [address], [place], [place] [address]   Troponin I (q 6hr x 3)     Status: None   Collection Time: 09/10/17  3:16 AM  Result Value Ref Range   Troponin I <0.03 <0.03 ng/mL    Comment: Performed at [facility] [address], [place], [place] [address]    Dg Chest 2 View  Result Date: 09/09/2017 CLINICAL DATA:  Chest pain EXAM: CHEST  2 VIEW COMPARISON:  01/27/2016 FINDINGS: Blunting of the lateral and posterior left costophrenic sulcus. No Kerley lines or air bronchogram. Large lung volumes with mild diaphragm flattening. Normal heart size when accounting for apical fat pad. IMPRESSION: 1. Trace left pleural effusion. 2. Otherwise stable from 2017. Electronically Signed   By: Monte Fantasia M.D.   On: 09/09/2017 19:36   Ct Angio Chest Pe W And/or Wo Contrast  Result Date: 09/09/2017 CLINICAL DATA:  Chest pain today. History of pulmonary embolus. PE suspected, intermediate prob, positive D-dimer EXAM: CT ANGIOGRAPHY CHEST WITH CONTRAST  TECHNIQUE: Multidetector CT imaging of the chest was performed using the standard protocol during bolus administration of intravenous contrast. Multiplanar CT image reconstructions and MIPs were obtained to evaluate the vascular anatomy. CONTRAST:  49m ISOVUE-370 IOPAMIDOL (ISOVUE-370)  INJECTION 76% COMPARISON:  Chest radiograph earlier today.  Chest CT 01/27/2016 FINDINGS: Cardiovascular: Majority of the prior filling defects in the right lower lobe pulmonary arteries on prior exam have resolved. Small volume eccentric chronic thrombus is present in the right lower lobe pulmonary artery. No new filling defects. Heart size upper normal. There is a small pericardial effusion that is new, measuring up to 1 cm anteriorly. Thoracic aorta is normal in caliber with trace atherosclerosis. No aortic dissection. Mild aortic tortuosity. Mediastinum/Nodes: No enlarged mediastinal or hilar lymph nodes. The esophagus is decompressed. No dominant thyroid nodule. Lungs/Pleura: Small left pleural effusion with adjacent compressive atelectasis. Mild hypoventilatory atelectasis in the lung bases. No confluent consolidation. No pulmonary edema. No pulmonary mass. Upper Abdomen: Unchanged cyst in the left lobe of the liver. No acute abnormality. Postcholecystectomy. Musculoskeletal: There are no acute or suspicious osseous abnormalities. Mild degenerative change in the spine. Review of the MIP images confirms the above findings. IMPRESSION: 1. No acute pulmonary embolus. Majority of the prior right lower lobe pulmonary emboli have resolved, minimal residual chronic thrombus in the right lower lobe pulmonary artery. 2. Small left pleural effusion.  Small pericardial effusion. Electronically Signed   By: MJeb LeveringM.D.   On: 09/09/2017 21:36    Review of Systems  Constitutional: Negative.   HENT: Negative.   Eyes: Negative.   Respiratory: Negative.   Cardiovascular: Positive for chest pain and leg swelling (chronic, mild and stable). Negative for palpitations, orthopnea, claudication and PND.  Gastrointestinal: Negative.   Genitourinary: Negative.   Musculoskeletal: Positive for joint pain (bilateral knee and back, chronic).  Neurological: Negative.   Endo/Heme/Allergies: Negative.    Psychiatric/Behavioral: Negative.    Blood pressure 113/69, pulse 70, temperature 97.9 F (36.6 C), temperature source Oral, resp. rate 18, height '6\' 3"'$  (1.905 m), weight (!) 140.8 kg (310 lb 6.4 oz), SpO2 98 %. Body mass index is 38.8 kg/m.  Physical Exam  Constitutional: He is oriented to person, place, and time. He appears well-developed.  Moderately obese in no acute distress.  HENT:  Head: Normocephalic and atraumatic.  Eyes: Conjunctivae are normal.  Neck: Normal range of motion. Neck supple.  Cardiovascular: Normal rate and intact distal pulses. Exam reveals no gallop and no friction rub.  No murmur heard. Respiratory: Effort normal and breath sounds normal.  GI: Soft. Bowel sounds are normal.  Musculoskeletal: Normal range of motion.  Neurological: He is alert and oriented to person, place, and time.  Skin: Skin is warm and dry.  Psychiatric: He has a normal mood and affect.   EKG 09/09/2017: Normal sinus rhythm, no evidence of ischemia.  Cardiac Panel (last 3 results) Recent Labs    09/09/17 2003 09/10/17 0316  TROPONINI <0.03 <0.03   Scheduled Meds: . aspirin EC  325 mg Oral Daily  . enoxaparin (LOVENOX) injection  40 mg Subcutaneous Daily  . lisinopril  20 mg Oral Daily  . morphine  60 mg Oral Q12H  . temazepam  30 mg Oral QHS  . venlafaxine XR  75 mg Oral BID   Continuous Infusions: PRN Meds:.acetaminophen, naloxegol oxalate, nitroGLYCERIN, ondansetron (ZOFRAN) IV, oxycodone   Assessment/Plan: 1.  Chest pain appears to be musculoskeletal however was nitrate responsive.  In spite of several hours of chest  pain, cardiac troponins are negative and EKG does not reveal any ischemic changes. 2.  History of SVT with no recurrence since 2016. 3.  Hyperlipidemia 4.  Hypertension 5.  Moderate obesity due to excessive calories. 6.  History of pulmonary embolism with very minimal residual segmental thrombus that is organized.  PE was in 2017.  Now off of  anticoagulation.  Recommendation: Although chest pain is musculoskeletal appearing, due to nitrate responsiveness and multiple cardiovascular risk factors that includes about, I have recommended that we proceed with Lexiscan sestamibi stress test today.  I will cancel his echocardiogram.  He has had an echocardiogram in July 2017 which is essentially normal without evidence of RV strain.  No indication for long-term anticoagulation.  Unless stress test is abnormal, I will see him back in the office.  He has not had any recurrence of SVT.   Adrian Prows, MD 09/10/2017, 8:57 AM [facility]. [place] Pager: [phone] Office: [phone] If no answer: Cell:  [phone]

## 2017-09-10 NOTE — Progress Notes (Signed)
[facility]                                                                              Patient Demographics  [name], is a 62 y.o. male, DOB - 1956/01/30, ZOX:[id number]  Admit date - 09/09/2017   Admitting Physician Eduard ClosArshad N Kakrakandy, MD  Outpatient Primary MD for the patient is Patria ManeGassemi, Mike, MD  Outpatient specialists:   LOS - 0  days   Medical records reviewed and are as summarized below:    Chief Complaint  Patient presents with  . Chest Pain       Brief summary  Craig MunchJimmy F [name] is a 62 y.o. male with previous history of pulmonary embolism diagnosed in 2017 had taken anticoagulation for 1 year and stopped, history of hypertension and SVT presented to the ER with complaints of chest pain.  Patient has been having left-sided chest pain for 1 day.  Pain is mostly stabbing in nature nonradiating no associated shortness of breath fever chills or productive cough.  Pain radiates to the left arm.  No exertional symptoms.  Assessment & Plan    Atypical chest pain -Still present 1-2/10.  Prior history of pulmonary embolism hence CT angiogram done which showed no acute PE however has minimal chronic residual thrombus in the right lower lobe pulmonary artery -Troponins x3 negative, EKG negative for acute ischemic changes -Cardiology consulted, seen by Dr. Jacinto HalimGanji, recommended nuclear medicine stress test today  Hypertension -Continue lisinopril  History of SVT -Currently stable, continue beta-blockers   Chronic pain -Continue outpatient regimen  Previous history of PE -Patient reported that he has finished anticoagulation, CT angiogram shows only minimal residual thrombus but no acute PE  Code Status: Full code DVT Prophylaxis:  Lovenox  Family Communication: Discussed in detail with the patient, all imaging results, lab results explained to the patient   Disposition Plan: Possible DC home if stress test negative  Time Spent in minutes   25  minutes  Procedures:  Nuclear medicine stress test today  Consultants:   Cardiology  Antimicrobials:      Medications  Scheduled Meds: . regadenoson      . aspirin EC  325 mg Oral Daily  . enoxaparin (LOVENOX) injection  40 mg Subcutaneous Daily  . lisinopril  20 mg Oral Daily  . morphine  60 mg Oral Q12H  . temazepam  30 mg Oral QHS  . venlafaxine XR  75 mg Oral BID   Continuous Infusions: PRN Meds:.acetaminophen, naloxegol oxalate, nitroGLYCERIN, ondansetron (ZOFRAN) IV, oxycodone   Antibiotics   Anti-infectives (From admission, onward)   None        Subjective:   Craig DutchJimmy Delosreyes was seen and examined today.  States has minimal chest pain, 1-2/10, different from GERD or his chronic pain.  No nausea or vomiting.  No acute shortness of breath.  Patient denies dizziness, abdominal pain, D/C, new weakness, numbess, tingling. No acute events overnight.    Objective:   Vitals:   09/10/17 0947 09/10/17 0949 09/10/17 0950 09/10/17 1000  BP: 125/87 115/76 123/77 129/86  Pulse:    82  Resp:    18  Temp:  98.1 F (36.7 C)  TempSrc:    Oral  SpO2:    100%  Weight:      Height:        Intake/Output Summary (Last 24 hours) at 09/10/2017 1125 Last data filed at 09/10/2017 4098 Gross per 24 hour  Intake -  Output 250 ml  Net -250 ml     Wt Readings from Last 3 Encounters:  09/10/17 (!) 140.8 kg (310 lb 6.4 oz)  02/26/17 (!) 139.7 kg (308 lb)  07/31/16 131.5 kg (290 lb)     Exam  General: Alert and oriented x 3, NAD  Eyes:   HEENT:  Atraumatic, normocephalic  Cardiovascular: S1 S2 auscultated, no rubs, murmurs or gallops. Regular rate and rhythm.  Respiratory: Clear to auscultation bilaterally, no wheezing, rales or rhonchi  Gastrointestinal: Soft, nontender, nondistended, + bowel sounds  Ext: no pedal edema bilaterally  Neuro: AAOx3, Cr N's II- XII. Strength 5/5 upper and lower extremities bilaterally,   Musculoskeletal: No digital cyanosis,  clubbing  Skin: No rashes  Psych: Normal affect and demeanor, alert and oriented x3    Data Reviewed:  I have personally reviewed following labs and imaging studies  Micro Results No results found for this or any previous visit (from the past 240 hour(s)).  Radiology Reports Dg Chest 2 View  Result Date: 09/09/2017 CLINICAL DATA:  Chest pain EXAM: CHEST  2 VIEW COMPARISON:  01/27/2016 FINDINGS: Blunting of the lateral and posterior left costophrenic sulcus. No Kerley lines or air bronchogram. Large lung volumes with mild diaphragm flattening. Normal heart size when accounting for apical fat pad. IMPRESSION: 1. Trace left pleural effusion. 2. Otherwise stable from 2017. Electronically Signed   By: Marnee Spring M.D.   On: 09/09/2017 19:36   Ct Angio Chest Pe W And/or Wo Contrast  Result Date: 09/09/2017 CLINICAL DATA:  Chest pain today. History of pulmonary embolus. PE suspected, intermediate prob, positive D-dimer EXAM: CT ANGIOGRAPHY CHEST WITH CONTRAST TECHNIQUE: Multidetector CT imaging of the chest was performed using the standard protocol during bolus administration of intravenous contrast. Multiplanar CT image reconstructions and MIPs were obtained to evaluate the vascular anatomy. CONTRAST:  84mL ISOVUE-370 IOPAMIDOL (ISOVUE-370) INJECTION 76% COMPARISON:  Chest radiograph earlier today.  Chest CT 01/27/2016 FINDINGS: Cardiovascular: Majority of the prior filling defects in the right lower lobe pulmonary arteries on prior exam have resolved. Small volume eccentric chronic thrombus is present in the right lower lobe pulmonary artery. No new filling defects. Heart size upper normal. There is a small pericardial effusion that is new, measuring up to 1 cm anteriorly. Thoracic aorta is normal in caliber with trace atherosclerosis. No aortic dissection. Mild aortic tortuosity. Mediastinum/Nodes: No enlarged mediastinal or hilar lymph nodes. The esophagus is decompressed. No dominant thyroid  nodule. Lungs/Pleura: Small left pleural effusion with adjacent compressive atelectasis. Mild hypoventilatory atelectasis in the lung bases. No confluent consolidation. No pulmonary edema. No pulmonary mass. Upper Abdomen: Unchanged cyst in the left lobe of the liver. No acute abnormality. Postcholecystectomy. Musculoskeletal: There are no acute or suspicious osseous abnormalities. Mild degenerative change in the spine. Review of the MIP images confirms the above findings. IMPRESSION: 1. No acute pulmonary embolus. Majority of the prior right lower lobe pulmonary emboli have resolved, minimal residual chronic thrombus in the right lower lobe pulmonary artery. 2. Small left pleural effusion.  Small pericardial effusion. Electronically Signed   By: Rubye Oaks M.D.   On: 09/09/2017 21:36    Lab Data:  CBC: Recent Labs  Lab 09/09/17 2003 09/10/17 0316  WBC 8.6 8.9  HGB 14.5 14.9  HCT 43.9 45.6  MCV 91.5 92.5  PLT 201 209   Basic Metabolic Panel: Recent Labs  Lab 09/09/17 2003 09/10/17 0316  NA 134*  --   K 4.1  --   CL 98*  --   CO2 26  --   GLUCOSE 119*  --   BUN 15  --   CREATININE 1.22 1.09  CALCIUM 8.9  --    GFR: Estimated Creatinine Clearance: 110.4 mL/min (by C-G formula based on SCr of 1.09 mg/dL). Liver Function Tests: No results for input(s): AST, ALT, ALKPHOS, BILITOT, PROT, ALBUMIN in the last 168 hours. No results for input(s): LIPASE, AMYLASE in the last 168 hours. No results for input(s): AMMONIA in the last 168 hours. Coagulation Profile: No results for input(s): INR, PROTIME in the last 168 hours. Cardiac Enzymes: Recent Labs  Lab 09/09/17 2003 09/10/17 0316  TROPONINI <0.03 <0.03   BNP (last 3 results) No results for input(s): PROBNP in the last 8760 hours. HbA1C: No results for input(s): HGBA1C in the last 72 hours. CBG: No results for input(s): GLUCAP in the last 168 hours. Lipid Profile: No results for input(s): CHOL, HDL, LDLCALC, TRIG,  CHOLHDL, LDLDIRECT in the last 72 hours. Thyroid Function Tests: No results for input(s): TSH, T4TOTAL, FREET4, T3FREE, THYROIDAB in the last 72 hours. Anemia Panel: No results for input(s): VITAMINB12, FOLATE, FERRITIN, TIBC, IRON, RETICCTPCT in the last 72 hours. Urine analysis:    Component Value Date/Time   COLORURINE YELLOW 08/04/2015 1217   APPEARANCEUR CLEAR 08/04/2015 1217   LABSPEC 1.016 08/04/2015 1217   PHURINE 5.5 08/04/2015 1217   GLUCOSEU NEGATIVE 08/04/2015 1217   HGBUR MODERATE (A) 08/04/2015 1217   BILIRUBINUR NEGATIVE 08/04/2015 1217   KETONESUR NEGATIVE 08/04/2015 1217   PROTEINUR NEGATIVE 08/04/2015 1217   UROBILINOGEN 1.0 05/08/2015 1942   NITRITE NEGATIVE 08/04/2015 1217   LEUKOCYTESUR NEGATIVE 08/04/2015 1217     Longino Trefz M.D. [facility] 09/10/2017, 11:25 AM  Pager: [phone] Between 7am to 7pm - call Pager - [phone]  After 7pm go to [email or web address] - password [email or web address]  Call night coverage person covering after 7pm

## 2017-09-10 NOTE — Discharge Summary (Signed)
Physician Discharge Summary   Patient ID: [name] MRN: [id number] DOB/AGE: March 22, 1956 62 y.o.  Admit date: 09/09/2017 Discharge date: 09/10/2017  Primary Care Physician:  Craig Mane, MD  Discharge Diagnoses:    . Chest pain History of SVT  hypertension Chronic pain Previous history of PE  Consults: Cardiology, Dr. Jacinto [name]  Recommendations for Outpatient Follow-up:  1. Please repeat CBC/BMET at next visit   DIET: Heart healthy diet    Allergies:   Allergies  Allergen Reactions  . Verapamil Other (See Comments)    Causes pvc's  . Imitrex [Sumatriptan] Other (See Comments)    Chest pain, 2004 (tolerates 50 mg prn)  . Duloxetine Hcl     Psychosis     DISCHARGE MEDICATIONS: Allergies as of 09/10/2017      Reactions   Verapamil Other (See Comments)   Causes pvc's   Imitrex [sumatriptan] Other (See Comments)   Chest pain, 2004 (tolerates 50 mg prn)   Duloxetine Hcl    Psychosis      Medication List    TAKE these medications   atorvastatin 80 MG tablet Commonly known as:  LIPITOR Take 1 tablet (80 mg total) by mouth daily.   famciclovir 500 MG tablet Commonly known as:  FAMVIR Take 500 mg by mouth 2 (two) times daily as needed. ulcers   GABAPENTIN PO Take 300 mg by mouth 3 (three) times daily.   lisinopril 20 MG tablet Commonly known as:  PRINIVIL,ZESTRIL Take 20 mg by mouth daily.   metoprolol succinate 100 MG 24 hr tablet Commonly known as:  TOPROL-XL Take 100 mg by mouth daily. Take with or immediately following a meal.   morphine 60 MG 12 hr tablet Commonly known as:  MS CONTIN Take 60 mg by mouth 2 (two) times daily.   naloxegol oxalate 25 MG Tabs tablet Commonly known as:  MOVANTIK Take 1 tablet (25 mg total) by mouth daily before breakfast. What changed:    when to take this  reasons to take this   nitroGLYCERIN 0.4 MG SL tablet Commonly known as:  NITROSTAT Place 1 tablet (0.4 mg total) under the tongue every 5 (five) minutes  as needed for chest pain.   ondansetron 8 MG tablet Commonly known as:  ZOFRAN Take 8 mg by mouth every 8 (eight) hours as needed. nausea   Oxycodone HCl 20 MG Tabs Take 20 mg by mouth 3 (three) times daily.   PROAIR HFA 108 (90 Base) MCG/ACT inhaler Generic drug:  albuterol Inhale 1 puff into the lungs every 6 (six) hours as needed for wheezing or shortness of breath.   temazepam 30 MG capsule Commonly known as:  RESTORIL Take 30-60 mg by mouth at bedtime.   trifluridine 1 % ophthalmic solution Commonly known as:  VIROPTIC Place 1 drop into both eyes 2 (two) times daily as needed (breakouts (blurry, matted eyelids)).   venlafaxine XR 75 MG 24 hr capsule Commonly known as:  EFFEXOR-XR Take 1 capsule (75 mg total) by mouth daily with breakfast. What changed:  when to take this        Brief H and P: For complete details please refer to admission H and P, but in briefJimmy F [name] a 61 y.o.malewithprevious history of pulmonary embolism diagnosed in 2017 had taken anticoagulation for 1 year and stopped, history of hypertension and SVT presented to the ER with complaints of chest pain. Patient has been having left-sided chest pain for 1 day. Pain is mostly stabbing in nature nonradiating  no associated shortness of breath fever chills or productive cough. Pain radiates to the left arm. No exertional symptoms.   Hospital Course:    Atypical chest pain - Prior history of pulmonary embolism hence CT angiogram done which showed no acute PE however has minimal chronic residual thrombus in the right lower lobe pulmonary artery -Troponins x3 negative, EKG negative for acute ischemic changes -Cardiology consulted, seen by Dr. Jacinto [name], recommended nuclear medicine stress test and 2D echo -Nuclear medicine stress test showed EF 61%, no reversible ischemia or infarction 2D echo showed EF 55-60%, no wall motion abnormalities. He was cleared by cardiology to be discharged home.     Hypertension -Continue lisinopril  History of SVT -Currently stable, continue beta-blockers   Chronic pain -Continue outpatient regimen  Previous history of PE -Patient reported that he has finished anticoagulation, CT angiogram shows only minimal residual thrombus but no acute PE    Day of Discharge BP 118/87 (BP Location: Right Arm)   Pulse 81   Temp 98.2 F (36.8 C) (Oral)   Resp 18   Ht 6\' 3"  (1.905 m)   Wt (!) 140.8 kg (310 lb 6.4 oz)   SpO2 97%   BMI 38.80 kg/m   Physical Exam: General: Alert and awake oriented x3 not in any acute distress. HEENT: anicteric sclera, pupils reactive to light and accommodation CVS: S1-S2 clear no murmur rubs or gallops Chest: clear to auscultation bilaterally, no wheezing rales or rhonchi Abdomen: soft nontender, nondistended, normal bowel sounds Extremities: no cyanosis, clubbing or edema noted bilaterally Neuro: Cranial nerves II-XII intact, no focal neurological deficits   The results of significant diagnostics from this hospitalization (including imaging, microbiology, ancillary and laboratory) are listed below for reference.    LAB RESULTS: Basic Metabolic Panel: Recent Labs  Lab 09/09/17 2003 09/10/17 0316  NA 134*  --   K 4.1  --   CL 98*  --   CO2 26  --   GLUCOSE 119*  --   BUN 15  --   CREATININE 1.22 1.09  CALCIUM 8.9  --    Liver Function Tests: No results for input(s): AST, ALT, ALKPHOS, BILITOT, PROT, ALBUMIN in the last 168 hours. No results for input(s): LIPASE, AMYLASE in the last 168 hours. No results for input(s): AMMONIA in the last 168 hours. CBC: Recent Labs  Lab 09/09/17 2003 09/10/17 0316  WBC 8.6 8.9  HGB 14.5 14.9  HCT 43.9 45.6  MCV 91.5 92.5  PLT 201 209   Cardiac Enzymes: Recent Labs  Lab 09/09/17 2003 09/10/17 0316  TROPONINI <0.03 <0.03   BNP: Invalid input(s): POCBNP CBG: No results for input(s): GLUCAP in the last 168 hours.  Significant Diagnostic Studies:   Dg Chest 2 View  Result Date: 09/09/2017 CLINICAL DATA:  Chest pain EXAM: CHEST  2 VIEW COMPARISON:  01/27/2016 FINDINGS: Blunting of the lateral and posterior left costophrenic sulcus. No Kerley lines or air bronchogram. Large lung volumes with mild diaphragm flattening. Normal heart size when accounting for apical fat pad. IMPRESSION: 1. Trace left pleural effusion. 2. Otherwise stable from 2017. Electronically Signed   By: Marnee Spring M.D.   On: 09/09/2017 19:36   Ct Angio Chest Pe W And/or Wo Contrast  Result Date: 09/09/2017 CLINICAL DATA:  Chest pain today. History of pulmonary embolus. PE suspected, intermediate prob, positive D-dimer EXAM: CT ANGIOGRAPHY CHEST WITH CONTRAST TECHNIQUE: Multidetector CT imaging of the chest was performed using the standard protocol during bolus administration of intravenous contrast.  Multiplanar CT image reconstructions and MIPs were obtained to evaluate the vascular anatomy. CONTRAST:  84mL ISOVUE-370 IOPAMIDOL (ISOVUE-370) INJECTION 76% COMPARISON:  Chest radiograph earlier today.  Chest CT 01/27/2016 FINDINGS: Cardiovascular: Majority of the prior filling defects in the right lower lobe pulmonary arteries on prior exam have resolved. Small volume eccentric chronic thrombus is present in the right lower lobe pulmonary artery. No new filling defects. Heart size upper normal. There is a small pericardial effusion that is new, measuring up to 1 cm anteriorly. Thoracic aorta is normal in caliber with trace atherosclerosis. No aortic dissection. Mild aortic tortuosity. Mediastinum/Nodes: No enlarged mediastinal or hilar lymph nodes. The esophagus is decompressed. No dominant thyroid nodule. Lungs/Pleura: Small left pleural effusion with adjacent compressive atelectasis. Mild hypoventilatory atelectasis in the lung bases. No confluent consolidation. No pulmonary edema. No pulmonary mass. Upper Abdomen: Unchanged cyst in the left lobe of the liver. No acute abnormality.  Postcholecystectomy. Musculoskeletal: There are no acute or suspicious osseous abnormalities. Mild degenerative change in the spine. Review of the MIP images confirms the above findings. IMPRESSION: 1. No acute pulmonary embolus. Majority of the prior right lower lobe pulmonary emboli have resolved, minimal residual chronic thrombus in the right lower lobe pulmonary artery. 2. Small left pleural effusion.  Small pericardial effusion. Electronically Signed   By: Rubye OaksMelanie  Ehinger M.D.   On: 09/09/2017 21:36    2D ECHO: Study Conclusions  - Left ventricle: The cavity size was normal. Wall thickness was   increased in a pattern of moderate LVH. Systolic function was   normal. The estimated ejection fraction was in the range of 55%   to 60%. Wall motion was normal; there were no regional wall   motion abnormalities. Left ventricular diastolic function   parameters were normal. - Pericardium, extracardiac: pericardial fat pad.   Disposition and Follow-up: Discharge Instructions    Diet - low sodium heart healthy   Complete by:  As directed    Increase activity slowly   Complete by:  As directed        DISPOSITION: home    DISCHARGE FOLLOW-UP Follow-up Information    Craig ManeGassemi, Mike, MD. Schedule an appointment as soon as possible for a visit in 2 week(s).   Specialty:  Internal Medicine Contact information: [address] [facility] [address] [phone]        Yates DecampGanji, Jay, MD. Schedule an appointment as soon as possible for a visit in 2 week(s).   Specialty:  Cardiology Contact information: [address] [place] [address] [phone]            Time spent on Discharge: 25mins   Signed:   Thad Rangeripudeep Toba Claudio M.D. [facility] 09/10/2017, 3:56 PM Pager: [phone]

## 2021-12-03 ENCOUNTER — Emergency Department (HOSPITAL_BASED_OUTPATIENT_CLINIC_OR_DEPARTMENT_OTHER)
Admission: EM | Admit: 2021-12-03 | Discharge: 2021-12-03 | Disposition: A | Discharge status: Home / Self Care | Payer: Medicare HMO | Attending: Emergency Medicine | Admitting: Emergency Medicine

## 2021-12-03 ENCOUNTER — Emergency Department (HOSPITAL_BASED_OUTPATIENT_CLINIC_OR_DEPARTMENT_OTHER): Payer: Medicare HMO

## 2021-12-03 ENCOUNTER — Encounter (HOSPITAL_BASED_OUTPATIENT_CLINIC_OR_DEPARTMENT_OTHER): Payer: Self-pay | Admitting: Emergency Medicine

## 2021-12-03 ENCOUNTER — Other Ambulatory Visit: Payer: Self-pay

## 2021-12-03 DIAGNOSIS — W06XXXA Fall from bed, initial encounter: Secondary | ICD-10-CM | POA: Diagnosis not present

## 2021-12-03 DIAGNOSIS — M25532 Pain in left wrist: Secondary | ICD-10-CM | POA: Insufficient documentation

## 2021-12-03 DIAGNOSIS — M533 Sacrococcygeal disorders, not elsewhere classified: Secondary | ICD-10-CM | POA: Diagnosis not present

## 2021-12-03 MED ORDER — LIDOCAINE 5 % EX PTCH
1.0000 | MEDICATED_PATCH | CUTANEOUS | Status: DC
  Administered 2021-12-03: 1 via TRANSDERMAL
  Filled 2021-12-03: qty 1

## 2021-12-03 NOTE — ED Provider Notes (Signed)
?[facility] EMERGENCY DEPARTMENT ?Provider Note ? ? ?CSN: [id number] ?Arrival date & time: 12/03/21  0947 ? ?  ? ?History ? ?Chief Complaint  ?Patient presents with  ? Fall  ? ? ?[name] is a 66 y.o. male. ? ?Presents the emergency department for evaluation of left wrist and tailbone pain.  Patient was getting out of bed to use the restroom around 4 AM.  He uses a wheelchair.  He was trying to move an object and bent over when the wheelchair slipped out from underneath him.  He fell onto an outstretched left hand and landed on his buttocks.  He did not hit his head or lose consciousness.  He denies headache or vomiting.  No neck pain.  Pain is worse with movement of the left wrist.  He also has pain with sitting on the sore area of the tailbone.  Patient takes chronic opioids for pain.  He has had a previous left wrist infection and poor range of motion at this joint at baseline. ? ? ?  ? ?Home Medications ?Prior to Admission medications   ?Medication Sig Start Date End Date Taking? Authorizing Provider  ?albuterol (PROAIR HFA) 108 (90 BASE) MCG/ACT inhaler Inhale 1 puff into the lungs every 6 (six) hours as needed for wheezing or shortness of breath.  09/06/13   [provider]  ?atorvastatin (LIPITOR) 80 MG tablet Take 1 tablet (80 mg total) by mouth daily. 08/30/15   Angiulli, Mcarthur Rossettianiel J, PA-C  ?famciclovir (FAMVIR) 500 MG tablet Take 500 mg by mouth 2 (two) times daily as needed. ulcers 10/25/13   [provider]  ?GABAPENTIN PO Take 300 mg by mouth 3 (three) times daily.     [provider]  ?lisinopril (PRINIVIL,ZESTRIL) 20 MG tablet Take 20 mg by mouth daily.    [provider]  ?metoprolol succinate (TOPROL-XL) 100 MG 24 hr tablet Take 100 mg by mouth daily. Take with or immediately following a meal.    [provider]  ?morphine (MS CONTIN) 60 MG 12 hr tablet Take 60 mg by mouth 2 (two) times daily. 01/11/16   [provider]  ?naloxegol oxalate  (MOVANTIK) 25 MG TABS tablet Take 1 tablet (25 mg total) by mouth daily before breakfast. ?Patient taking differently: Take 25 mg by mouth daily as needed (open induced constipation).  05/24/15   Angiulli, Mcarthur Rossettianiel J, PA-C  ?nitroGLYCERIN (NITROSTAT) 0.4 MG SL tablet Place 1 tablet (0.4 mg total) under the tongue every 5 (five) minutes as needed for chest pain. 09/10/17   Rai, Delene Ruffiniipudeep K, MD  ?ondansetron (ZOFRAN) 8 MG tablet Take 8 mg by mouth every 8 (eight) hours as needed. nausea 12/18/15   [provider]  ?Oxycodone HCl 20 MG TABS Take 20 mg by mouth 3 (three) times daily.    [provider]  ?temazepam (RESTORIL) 30 MG capsule Take 30-60 mg by mouth at bedtime.  01/24/16   [provider]  ?trifluridine (VIROPTIC) 1 % ophthalmic solution Place 1 drop into both eyes 2 (two) times daily as needed (breakouts (blurry, matted eyelids)).    [provider]  ?venlafaxine XR (EFFEXOR-XR) 75 MG 24 hr capsule Take 1 capsule (75 mg total) by mouth daily with breakfast. ?Patient taking differently: Take 75 mg by mouth 2 (two) times daily.  08/30/15   Angiulli, Mcarthur Rossettianiel J, PA-C  ?   ? ?Allergies    ?Verapamil, Imitrex [sumatriptan], and Duloxetine hcl   ? ?Review of Systems   ?  Review of Systems ? ?Physical Exam ?Updated Vital Signs ?BP 139/87   Pulse 91   Temp 97.6 ?F (36.4 ?C) (Oral)   Resp 16   Ht 6\' 3"  (1.905 m)   Wt (!) 173.3 kg   SpO2 95%   BMI 47.75 kg/m?  ?Physical Exam ?Vitals and nursing note reviewed.  ?Constitutional:   ?   Appearance: He is well-developed.  ?HENT:  ?   Head: Normocephalic and atraumatic.  ?Eyes:  ?   Conjunctiva/sclera: Conjunctivae normal.  ?Cardiovascular:  ?   Pulses: Normal pulses. No decreased pulses.  ?Musculoskeletal:     ?   General: Tenderness present.  ?   Left elbow: No tenderness.  ?   Left forearm: No tenderness.  ?   Left wrist: Tenderness present. No swelling or deformity. Decreased range of motion.  ?   Cervical back: Normal range of motion and  neck supple. No tenderness.  ?   Thoracic back: No tenderness.  ?   Lumbar back: No tenderness.  ?   Right lower leg: No edema.  ?   Left lower leg: No edema.  ?Skin: ?   General: Skin is warm and dry.  ?Neurological:  ?   Mental Status: He is alert.  ?   Sensory: No sensory deficit.  ?   Comments: Motor, sensation, and vascular distal to the injury is fully intact.   ?Psychiatric:     ?   Mood and Affect: Mood normal.  ? ? ?ED Results / Procedures / Treatments   ?Labs ?(all labs ordered are listed, but only abnormal results are displayed) ?Labs Reviewed - No data to display ? ?EKG ?None ? ?Radiology ?DG Sacrum/Coccyx ? ?Result Date: 12/03/2021 ?CLINICAL DATA:  Fall from wheelchair, coccygeal pain, initial encounter. EXAM: SACRUM AND COCCYX - 2+ VIEW COMPARISON:  None Available. FINDINGS: Osteopenia.  No acute osseous abnormality. IMPRESSION: No acute osseous abnormality. Electronically Signed   By: 02/02/2022 M.D.   On: 12/03/2021 11:04  ? ?DG Wrist Complete Left ? ?Result Date: 12/03/2021 ?CLINICAL DATA:  Left wrist pain after falling out of her wheelchair. EXAM: LEFT WRIST - COMPLETE 3+ VIEW COMPARISON:  Left wrist MRI and x-rays dated August 04, 2015. FINDINGS: No acute fracture or dislocation. 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 Chronic distal radius articular surface irregularity and radiocarpal joint space narrowing, also secondary to prior septic arthritis. 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 Bone mineralization is normal. IMPRESSION: 1. No acute osseous abnormality. 2. 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 Electronically Signed   By: August 06, 2015 M.D.   On: 12/03/2021 11:15   ? ?Procedures ?Procedures  ? ? ?Medications Ordered in ED ?Medications  ?lidocaine (LIDODERM) 5 % 1 patch (has no administration in time range)  ? ? ?ED Course/ Medical Decision Making/ A&P ?  ?Patient seen and  examined. History obtained directly from patient and family member at bedside.  They state that they came in for x-rays to ensure no broken bones.  Work-up including labs, imaging, EKG ordered in triage, if performed, were reviewed.   ? ?Labs/EKG: None ordered ? ?Imaging: Independently reviewed and interpreted.  This included: X-ray of the wrist, agree postsurgical with postinfectious changes without obvious fracture.  X-ray of the sacral area and coccyx, agree no fracture. ? ?Medications/Fluids: Ordered: Lidoderm patch. ? ?Most recent vital signs reviewed and are as follows: ?BP 139/87   Pulse  91   Temp 97.6 ?F (36.4 ?C) (Oral)   Resp 16   Ht 6\' 3"  (1.905 m)   Wt (!) 173.3 kg   SpO2 95%   BMI 47.75 kg/m?  ? ?Initial impression: Wrist pain, tailbone pain ? ?Home treatment plan: We will give Lidoderm patch here for the tailbone area, patient will continue home pain medications.  Encouraged use of donut pillow if desired.  We will give resplint or Ace wrap for the wrist to limit mobility to help pain.  Discussed RICE protocol. ? ?Follow-up instructions discussed with patient: Encourage follow-up with PCP 1 week if symptoms or not improving ? ?                        ?Medical Decision Making ?Amount and/or Complexity of Data Reviewed ?Radiology: ordered. ? ?Risk ?Prescription drug management. ? ? ?Patient with mechanical fall this morning.  No head or neck injury suspected.  X-ray imaging does not demonstrate signs of fracture.  No concern for compartment syndrome.  No concern for lumbar compression fracture given location of pain. ? ? ? ? ? ? ? ?Final Clinical Impression(s) / ED Diagnoses ?Final diagnoses:  ?Left wrist pain  ?Coccyx pain  ? ? ?Rx / DC Orders ?ED Discharge Orders   ? ? None  ? ?  ? ? ?  ? , PA-C ?12/03/21 1148 ? ?  ?02/02/22, MD ?12/03/21 1557 ? ?

## 2021-12-03 NOTE — Discharge Instructions (Signed)
Please read and follow all provided instructions. ? ?Your diagnoses today include:  ?1. Left wrist pain   ?2. Coccyx pain   ? ? ?Tests performed today include: ?X-ray of the wrist: Shows changes from previous joint infection but no broken bones ?X-ray of the sacrum and coccyx: No signs of broken bones ?Vital signs. See below for your results today.  ? ?Medications prescribed:  ?None ? ?Take any prescribed medications only as directed. ? ?Home care instructions:  ?Follow any educational materials contained in this packet. ? ?Follow-up instructions: ?Please follow-up with your primary care provider in the next 7 days for further evaluation of your symptoms if not improving with basic care.  ? ?Return instructions:  ?Please return to the Emergency Department if you experience worsening symptoms.  ?Please return if you have any other emergent concerns. ? ?Additional Information: ? ?Your vital signs today were: ?BP 139/87   Pulse 91   Temp 97.6 ?F (36.4 ?C) (Oral)   Resp 16   Ht 6\' 3"  (1.905 m)   Wt (!) 173.3 kg   SpO2 95%   BMI 47.75 kg/m?  ?If your blood pressure (BP) was elevated above 135/85 this visit, please have this repeated by your doctor within one month. ?-------------- ? ?

## 2021-12-03 NOTE — ED Triage Notes (Addendum)
Pt fell out of wheelchair this morning while trying to put something on the floor; c/o LT wrist and coccyx pain (fell on marble floor); did not hit head; no thinners ?

## 2023-10-05 ENCOUNTER — Other Ambulatory Visit (HOSPITAL_COMMUNITY): Payer: Self-pay

## 2023-10-05 MED ORDER — MORPHINE SULFATE ER 30 MG PO TBCR
30.0000 mg | EXTENDED_RELEASE_TABLET | Freq: Two times a day (BID) | ORAL | 0 refills | Status: AC
  Filled 2023-10-05: qty 60, 30d supply, fill #0
